# Patient Record
Sex: Female | Born: 1945 | Race: White | Hispanic: No | Marital: Married | Smoking: Never smoker
Health system: Southern US, Community
[De-identification: ages and names within clinical notes are randomized; demographics above are authoritative.]

## PROBLEM LIST (undated history)

## (undated) DIAGNOSIS — K219 Gastro-esophageal reflux disease without esophagitis: Secondary | ICD-10-CM

## (undated) DIAGNOSIS — R112 Nausea with vomiting, unspecified: Secondary | ICD-10-CM

## (undated) DIAGNOSIS — Z9889 Other specified postprocedural states: Secondary | ICD-10-CM

## (undated) DIAGNOSIS — K279 Peptic ulcer, site unspecified, unspecified as acute or chronic, without hemorrhage or perforation: Secondary | ICD-10-CM

## (undated) DIAGNOSIS — L409 Psoriasis, unspecified: Secondary | ICD-10-CM

## (undated) DIAGNOSIS — R0600 Dyspnea, unspecified: Secondary | ICD-10-CM

## (undated) DIAGNOSIS — I509 Heart failure, unspecified: Secondary | ICD-10-CM

## (undated) DIAGNOSIS — Z8489 Family history of other specified conditions: Secondary | ICD-10-CM

## (undated) DIAGNOSIS — J984 Other disorders of lung: Secondary | ICD-10-CM

## (undated) DIAGNOSIS — M1990 Unspecified osteoarthritis, unspecified site: Secondary | ICD-10-CM

## (undated) DIAGNOSIS — I10 Essential (primary) hypertension: Secondary | ICD-10-CM

## (undated) DIAGNOSIS — K911 Postgastric surgery syndromes: Secondary | ICD-10-CM

## (undated) DIAGNOSIS — I499 Cardiac arrhythmia, unspecified: Secondary | ICD-10-CM

## (undated) DIAGNOSIS — R519 Headache, unspecified: Secondary | ICD-10-CM

## (undated) DIAGNOSIS — I4891 Unspecified atrial fibrillation: Secondary | ICD-10-CM

## (undated) DIAGNOSIS — I219 Acute myocardial infarction, unspecified: Secondary | ICD-10-CM

## (undated) DIAGNOSIS — R51 Headache: Secondary | ICD-10-CM

## (undated) DIAGNOSIS — I2510 Atherosclerotic heart disease of native coronary artery without angina pectoris: Secondary | ICD-10-CM

## (undated) DIAGNOSIS — J479 Bronchiectasis, uncomplicated: Secondary | ICD-10-CM

## (undated) HISTORY — PX: LUNG SURGERY: SHX703

## (undated) HISTORY — DX: Other disorders of lung: J98.4

## (undated) HISTORY — PX: EYE SURGERY: SHX253

## (undated) HISTORY — PX: BACK SURGERY: SHX140

## (undated) HISTORY — DX: Essential (primary) hypertension: I10

## (undated) HISTORY — PX: FOOT SURGERY: SHX648

## (undated) HISTORY — PX: CHOLECYSTECTOMY: SHX55

## (undated) HISTORY — DX: Heart failure, unspecified: I50.9

## (undated) HISTORY — PX: THOROCOTOMY WITH LOBECTOMY: SHX6118

---

## 1997-08-29 ENCOUNTER — Other Ambulatory Visit: Admission: RE | Admit: 1997-08-29 | Discharge: 1997-08-29 | Payer: Self-pay | Admitting: Obstetrics and Gynecology

## 1998-09-23 ENCOUNTER — Other Ambulatory Visit: Admission: RE | Admit: 1998-09-23 | Discharge: 1998-09-23 | Payer: Self-pay | Admitting: Obstetrics and Gynecology

## 1999-09-27 ENCOUNTER — Other Ambulatory Visit: Admission: RE | Admit: 1999-09-27 | Discharge: 1999-09-27 | Payer: Self-pay | Admitting: Obstetrics and Gynecology

## 2000-09-27 ENCOUNTER — Other Ambulatory Visit: Admission: RE | Admit: 2000-09-27 | Discharge: 2000-09-27 | Payer: Self-pay | Admitting: Obstetrics and Gynecology

## 2003-03-12 ENCOUNTER — Other Ambulatory Visit: Admission: RE | Admit: 2003-03-12 | Discharge: 2003-03-12 | Payer: Self-pay | Admitting: Obstetrics and Gynecology

## 2004-08-17 ENCOUNTER — Ambulatory Visit: Payer: Self-pay | Admitting: Internal Medicine

## 2005-01-31 DIAGNOSIS — I219 Acute myocardial infarction, unspecified: Secondary | ICD-10-CM

## 2005-01-31 HISTORY — DX: Acute myocardial infarction, unspecified: I21.9

## 2005-04-11 ENCOUNTER — Inpatient Hospital Stay: Payer: Self-pay | Admitting: Internal Medicine

## 2005-06-06 ENCOUNTER — Ambulatory Visit: Payer: Self-pay | Admitting: Family Medicine

## 2005-10-13 ENCOUNTER — Ambulatory Visit: Payer: Self-pay | Admitting: Family Medicine

## 2009-09-21 ENCOUNTER — Ambulatory Visit: Payer: Self-pay | Admitting: Family Medicine

## 2010-05-20 ENCOUNTER — Ambulatory Visit: Payer: Self-pay | Admitting: Podiatry

## 2011-02-23 ENCOUNTER — Ambulatory Visit: Payer: Self-pay | Admitting: Family Medicine

## 2012-02-24 ENCOUNTER — Ambulatory Visit: Payer: Self-pay | Admitting: Family Medicine

## 2012-02-29 DIAGNOSIS — M5136 Other intervertebral disc degeneration, lumbar region: Secondary | ICD-10-CM | POA: Insufficient documentation

## 2012-03-12 ENCOUNTER — Encounter: Payer: Self-pay | Admitting: Rehabilitation

## 2012-03-31 ENCOUNTER — Encounter: Payer: Self-pay | Admitting: Rehabilitation

## 2012-04-23 ENCOUNTER — Ambulatory Visit: Payer: Self-pay | Admitting: Internal Medicine

## 2012-07-28 DIAGNOSIS — I2510 Atherosclerotic heart disease of native coronary artery without angina pectoris: Secondary | ICD-10-CM | POA: Diagnosis present

## 2012-07-28 DIAGNOSIS — E782 Mixed hyperlipidemia: Secondary | ICD-10-CM | POA: Insufficient documentation

## 2012-09-17 ENCOUNTER — Ambulatory Visit: Payer: Self-pay | Admitting: Family Medicine

## 2013-09-12 DIAGNOSIS — I428 Other cardiomyopathies: Secondary | ICD-10-CM | POA: Insufficient documentation

## 2013-10-03 ENCOUNTER — Encounter: Payer: Self-pay | Admitting: Sports Medicine

## 2013-10-03 ENCOUNTER — Ambulatory Visit (INDEPENDENT_AMBULATORY_CARE_PROVIDER_SITE_OTHER): Payer: Medicare Other | Admitting: Sports Medicine

## 2013-10-03 VITALS — BP 155/96 | Ht 66.0 in | Wt 135.0 lb

## 2013-10-03 DIAGNOSIS — I10 Essential (primary) hypertension: Secondary | ICD-10-CM

## 2013-10-03 DIAGNOSIS — M170 Bilateral primary osteoarthritis of knee: Secondary | ICD-10-CM

## 2013-10-03 DIAGNOSIS — M1710 Unilateral primary osteoarthritis, unspecified knee: Secondary | ICD-10-CM

## 2013-10-03 HISTORY — DX: Essential (primary) hypertension: I10

## 2013-10-03 MED ORDER — METHYLPREDNISOLONE ACETATE 40 MG/ML IJ SUSP
40.0000 mg | Freq: Once | INTRAMUSCULAR | Status: AC
Start: 2013-10-03 — End: 2013-10-03
  Administered 2013-10-03: 40 mg via INTRA_ARTICULAR

## 2013-10-03 NOTE — Progress Notes (Signed)
  Ana Washington - 68 y.o. female MRN [id number]  Date of birth: [birth year]  SUBJECTIVE:  Including CC & ROS.  Patient is a 68 year old Caucasian female presents today concerning of a cyst at the posterior aspect of her left knee. Patient reports that she was seen at urgent care by Dr. Leda Min who suspected she has a Baker cyst refer her over to her office. Patient reports that she's had tightness in her posterior knee for the past week. Describes the pain as a general soreness worse with getting out of the car or getting up from a sitting position. She also complains of some general aching within the colon her knee but denies any concern of fluid or effusion. She denies any known history of osteoarthritis of the knees no previous x-rays. She also denies ever seeing any cortisone injections. Patient has not taken any medication over-the-counter.   ROS: Review of systems otherwise negative except for information present in HPI  HISTORY: Past Medical, Surgical, Social, and Family History Reviewed & Updated per EMR. Pertinent Historical Findings include: Nonsmoker  DATA REVIEWED: No previous Xrays  PHYSICAL EXAM:  VS: BP:155/96 mmHg  HR: bpm  TEMP: ( )  RESP:   HT:5\' 6"  (167.6 cm)   WT:135 lb (61.236 kg)  BMI:21.8 PHYSICAL EXAM: KNEE EXAM:  General: well nourished Skin of LE: warm; dry, no rashes, lesions, ecchymosis or erythema. Vascular: Dorsal pedal pulses 2+ bilaterally Neurologically: Sensation to light touch lower extremities equal and intact bilaterally.  Observation: no knee effusion visualized, no previous surgical scars, patella in place, no quad muscle atrophy Palpation: No evidence of knee effusion with negative ballotable patella sign, no swelling to supra-patellar bursa  Palpable to 3 cm cystic mass at the posterior medial aspect of the left knee with mild tenderness No pain along the lateral joint line or medial joint line Range of motion:  Full knee flexion to  approximately 140, full extension 0, normal patellar tracking Ligamentous testing: ligamentous stablity  Negative patella apprehension test Meniscal evaluation: Negative McMurray's test, Negative thessaly's test, Normal gait  [facility] [place]: Ultrasound of the left knee revealed normal patella tendon, normal quadriceps tendon and long and short axis view, no significant joint effusion, mild calcifications in both medial and lateral meniscus. A 3-4 cm loculated Baker cyst posterior the last of the left knee.  ASSESSMENT & PLAN: See problem based charting & AVS for pt instructions.

## 2013-10-03 NOTE — Assessment & Plan Note (Signed)
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 Given the patient is having discomfort in his left knee with general aching recommended trying a intra-articular steroid injection to calm down inflammation. Do not recommend draining her Baker cyst given the loculated appearance on ultrasound. Suspect that the intra-articular injection will likely help with this generalized inflammation and potentially reabsorb this Baker cyst. Advised patient to followup in 3 weeks to reassess her knee pain. If she continues to have pain despite this injection before returning to the office to please call and we'll get her scheduled for bilateral x-rays prior to her follow-up.  Also provided patient with a body helix knee sleeve to help with compression.

## 2013-10-03 NOTE — Patient Instructions (Signed)
Wear knee brace with any activities or long periods of walking  Follow up in 3 week call soon if no improve and we will schedule knee xray before f/u in the office

## 2013-10-24 ENCOUNTER — Ambulatory Visit: Payer: Medicare Other | Admitting: Sports Medicine

## 2013-12-05 ENCOUNTER — Ambulatory Visit
Admission: RE | Admit: 2013-12-05 | Discharge: 2013-12-05 | Disposition: A | Discharge status: Home / Self Care | Payer: Medicare Other | Source: Ambulatory Visit | Attending: Sports Medicine | Admitting: Sports Medicine

## 2013-12-05 ENCOUNTER — Ambulatory Visit (INDEPENDENT_AMBULATORY_CARE_PROVIDER_SITE_OTHER): Payer: Medicare Other | Admitting: Sports Medicine

## 2013-12-05 ENCOUNTER — Encounter: Payer: Self-pay | Admitting: Sports Medicine

## 2013-12-05 VITALS — BP 133/84 | HR 67 | Ht 66.0 in | Wt 135.0 lb

## 2013-12-05 DIAGNOSIS — M170 Bilateral primary osteoarthritis of knee: Secondary | ICD-10-CM

## 2013-12-05 MED ORDER — DICLOFENAC SODIUM 1 % TD GEL
2.0000 g | Freq: Four times a day (QID) | TRANSDERMAL | Status: DC
Start: 2013-12-05 — End: 2017-02-07

## 2013-12-05 NOTE — Progress Notes (Addendum)
  Ana Washington - 68 y.o. female MRN [id number]  Date of birth: [birth year]  SUBJECTIVE:  Including CC & ROS.  Patient is a 68 year old Caucasian female falling up today on her left knee osteoarthritis.  Patient was last seen in September after she was discovered to have a Baker cyst wanted evaluation for possible drainage. Ultrasound exam revealed a loculated cyst that would not be amenable to drainage. Instead patient was treated with a intra-articular left knee cortisone injection, compression sleeve, and relative rest. Patient reported significant improvement in the swelling in her posterior knee and resolution of her pain with this injection. Also today reporting that for the most part her knee does feel better but she does have intermittent pain at times most of the pain is an occasional dull ache that is resolved with Tylenol or sharp pain that only lasts a few seconds. At this point patient has had no x-rays to evaluate the severity of her osteoarthritis.   ROS: Review of systems otherwise negative except for information present in HPI  HISTORY: Past Medical, Surgical, Social, and Family History Reviewed & Updated per EMR. Pertinent Historical Findings include: Nonsmoker HTN  DATA REVIEWED: No previous Xray's [facility] [place]: (10/03/13) Ultrasound of the left knee revealed normal patella tendon, normal quadriceps tendon and long and short axis view, no significant joint effusion, mild calcifications in both medial and lateral meniscus. A 3-4 cm loculated Baker cyst posterior the last of the left knee.  PHYSICAL EXAM:  VS: BP:133/84 mmHg  HR:67bpm  TEMP: ( )  RESP:   HT:5\' 6"  (167.6 cm)   WT:135 lb (61.236 kg)  BMI:21.8 PHYSICAL EXAM: KNEE EXAM:  General: well nourished Skin of LE: warm; dry, no rashes, lesions, ecchymosis or erythema. Vascular: Dorsal pedal pulses 2+ bilaterally Neurologically: Sensation to light touch lower extremities equal and intact bilaterally.  Observation: no  knee effusion visualized, no previous surgical scars, patella in place, no quad muscle atrophy Palpation: No evidence of knee effusion with negative ballotable patella sign, no swelling to supra-patellar bursa  Palpable to 1 cm cystic mass at the posterior medial aspect of the left knee with no tenderness Moderate to severe patella grinding No pain along the lateral joint line or medial joint line Range of motion:  Full knee flexion to approximately 140, full extension 0, normal patellar tracking Ligamentous testing: ligamentous stablity  Negative patella apprehension test Meniscal evaluation: Negative McMurray's test, positive thessaly's test, Normal gait   ASSESSMENT & PLAN: See problem based charting & AVS for pt instructions.   This patient has clinical contraindications to oral anti-inflammatory medication such as Motrin, ibuprofen, and meloxicam. She has a history of GI upset with the potential risk of GI bleed. Given these contraindications to oral anti-inflammatory medications I recommended topical anti-inflammatories as a more reasonable alternative to treat her osteoarthritis. Also given the evidence of mild to moderate osteoarthritis on her most recent x-rays patient would also find clinical benefit from hyaluronic acid injections for more localized intervention controlling her osteoarthritic knee pain. Patient did find some clinical benefit from steroid injection but had failed long-term relief from this intervention.

## 2013-12-05 NOTE — Patient Instructions (Addendum)
For intermittent symptomatic control of your osteoarthritis in your knee try the following modalities: -Wear the knee sleeve to help with compression, stabilization, and preventing swelling -Take Aleve 2 tablets in the morning and 2 tablets in the evening for 3-4 days to resolve pain and inflammation -Use the Voltaren gel topical anti-inflammatory for symptomatic control up to 3-4 times a day as needed - Apply heat or ice intermittently  If you're x-rays reveal severe arthritis we can repeat your steroid injection every 3-6 months with the next injection window at the beginning of December  If your knee x-rays reveal mild to moderate arthritis we can consider the Visco supplementation injections that will help lubricate the knee within the next 2-3 weeks.  Once I have the x-ray results will call and discuss further management options

## 2013-12-05 NOTE — Assessment & Plan Note (Signed)
Osteoarthritis of bilateral knees, with left knee pain. Patient on patient's clinical history, evidence of Baker cyst, and stable knee exam I suspect the majority for intermittent pain is from osteoarthritis.  Recommendations: -Advised patient that standing x-rays would be beneficial to further classify the degree of arthritis in her knee. -Patient only has mild to moderate arthritis we can consider a series of viscous supplementation injections to help with inflammation and pain. -Educated patient that if she has severe arthritis physical supplementation will likely not be clinically helpful. Further options for treatment with severe arthritis will be intermittent steroid injections every 3-6 months with no more than 3 a year versus referral to orthopedic surgeon for consideration of knee replacement.  - Patient is not interested in surgical intervention at this time would like to continue to treat with conservative management - Educated patient to treat intermittent pain and swelling with compression knee sleeve, topical Voltaren gel, and intermittent short courses of Aleve. We'll follow-up with patient over the phone with x-ray results and further planning for clinical management

## 2013-12-09 ENCOUNTER — Other Ambulatory Visit: Payer: Self-pay | Admitting: *Deleted

## 2013-12-16 ENCOUNTER — Ambulatory Visit: Payer: Self-pay | Admitting: Family Medicine

## 2015-06-30 ENCOUNTER — Encounter: Payer: Medicare Other | Attending: Family Medicine | Admitting: Respiratory Therapy

## 2015-06-30 DIAGNOSIS — J479 Bronchiectasis, uncomplicated: Secondary | ICD-10-CM | POA: Diagnosis present

## 2015-06-30 NOTE — Progress Notes (Signed)
Pulmonary Individual Treatment Plan  Patient Details  Name: Ana Washington MRN: [id number] Date of Birth: Jun 30, 1945 Referring Provider:  Dr Ana Washington  Initial Encounter Date:       Pulmonary Rehab from 06/30/2015 in [facility] Cardiac and Pulmonary Rehab   Date  06/30/15      Visit Diagnosis: Bronchiectasis without complication ([place])  Patient's Home Medications on Admission:  Current outpatient prescriptions:    alendronate (FOSAMAX) 70 MG tablet, take 1 tablet by mouth every week, Disp: , Rfl:    amLODipine (NORVASC) 5 MG tablet, take 1 tablet by mouth once daily, Disp: , Rfl:    azithromycin (ZITHROMAX) 250 MG tablet, Take by mouth., Disp: , Rfl:    Calcium Carbonate-Vitamin D (OYSTER SHELL/VITAMIN D) 600-125 MG-UNIT TABS, Take by mouth., Disp: , Rfl:    Cholecalciferol (VITAMIN D-1000 MAX ST) 1000 UNITS tablet, Take by mouth., Disp: , Rfl:    ciprofloxacin (CIPRO) 500 MG tablet, Take by mouth., Disp: , Rfl:    diclofenac sodium (VOLTAREN) 1 % GEL, Apply 2 g topically 4 (four) times daily., Disp: 100 g, Rfl: 1   fluocinonide (LIDEX) 0.05 % external solution, prn as needed, Disp: , Rfl:    fluticasone (FLONASE) 50 MCG/ACT nasal spray, Place into the nose., Disp: , Rfl:    hydrochlorothiazide (MICROZIDE) 12.5 MG capsule, take 1 capsule by mouth once daily, Disp: , Rfl:    ketoconazole (NIZORAL) 2 % shampoo, Apply topically., Disp: , Rfl:    losartan (COZAAR) 100 MG tablet, take 1 tablet by mouth once daily, Disp: , Rfl:    metoprolol succinate (TOPROL-XL) 50 MG 24 hr tablet, Take by mouth., Disp: , Rfl:    omeprazole (PRILOSEC OTC) 20 MG tablet, Take by mouth., Disp: , Rfl:    rivaroxaban (XARELTO) 20 MG TABS tablet, Take by mouth., Disp: , Rfl:    triamcinolone cream (KENALOG) 0.1 %, prn as needed, Disp: , Rfl:   Past Medical History: Past Medical History  Diagnosis Date   Hypertension    Lung disease    Essential hypertension, malignant 10/03/2013     Tobacco Use: History  Smoking status   Never Smoker   Smokeless tobacco   Not on file    Labs: Recent Review Flowsheet Data    There is no flowsheet data to display.       ADL [facility]:     Pulmonary Assessment Scores      06/30/15 1032       ADL [facility]   SOB Score total 77     Rest 1     Walk 3     Stairs 4     Bath 4     Dress 3     Shop 4        Pulmonary Function Assessment:     Pulmonary Function Assessment - 06/30/15 0915    Initial Spirometry Results   FVC% 37 %   FEV1% 42 %   FEV1/FVC Ratio 86.96   Post Bronchodilator Spirometry Results   FVC% 49 %   FEV1% 46 %   FEV1/FVC Ratio 80.79   Breath   Bilateral Breath Sounds Clear;Decreased   Shortness of Breath Yes;Fear of Shortness of Breath;Limiting activity      Exercise Target Goals: Date: 06/30/15  Exercise Program Goal: Individual exercise prescription set with THRR, safety & activity barriers. Participant demonstrates ability to understand and report RPE using BORG scale, to self-measure pulse accurately, and to acknowledge the importance of the exercise prescription.  Exercise Prescription Goal: Starting with aerobic activity 30 plus minutes a day, 3 days per week for initial exercise prescription. Provide home exercise prescription and guidelines that participant acknowledges understanding prior to discharge.  Activity Barriers & Risk Stratification:     Activity Barriers & Cardiac Risk Stratification - 06/30/15 0915    Activity Barriers & Cardiac Risk Stratification   Activity Barriers Shortness of Breath;Deconditioning;Muscular Weakness;Other (comment)   Comments Chronic cough   Cardiac Risk Stratification High      6 Minute Walk:     6 Minute Walk      06/30/15 1128       6 Minute Walk   Distance 912 feet     Walk Time 4.73 minutes     # of Rest Breaks 1  rest 1:16      MPH 2.19     METS 1.5     RPE 15     Perceived Dyspnea  7     Symptoms Yes (comment)      Comments shortness of breath     Resting HR 80 bpm     Resting BP 120/80 mmHg     Max Ex. HR 114 bpm     Max Ex. BP 158/90 mmHg     2 Minute Post BP 126/78 mmHg     Interval HR   Baseline HR 80     1 Minute HR 98     2 Minute HR 104     3 Minute HR 111     5 Minute HR 114     6 Minute HR 114     2 Minute Post HR 88     Interval Heart Rate? Yes     Interval Oxygen   Interval Oxygen? Yes     Baseline Oxygen Saturation % 96 %     1 Minute Oxygen Saturation % 97 %     2 Minute Oxygen Saturation % 93 %     3 Minute Oxygen Saturation % 93 %     5 Minute Oxygen Saturation % 91 %     6 Minute Oxygen Saturation % 93 %     2 Minute Post Oxygen Saturation % 98 %        Initial Exercise Prescription:     Initial Exercise Prescription - 06/30/15 1100    Date of Initial Exercise RX and Referring Provider   Date 06/30/15   Treadmill   MPH 1.3   Grade 0   Minutes 5  3:00 walk then rest 1:30   METs 1.5   NuStep   Level 1   Watts 20   Minutes 15  3 min then rest 1:30   METs 1.5   Biostep-RELP   Level 1   Watts 20   Minutes 15  3 min then rest 1:30   METs 1.5   Prescription Details   Frequency (times per week) 3   Duration Progress to 45 minutes of aerobic exercise without signs/symptoms of physical distress   Intensity   THRR 40-80% of Max Heartrate 108-136   Ratings of Perceived Exertion 11-13   Perceived Dyspnea 2-4   Progression   Progression Continue to progress workloads to maintain intensity without signs/symptoms of physical distress.   Resistance Training   Training Prescription Yes   Weight 1   Reps 10-12      Perform Capillary Blood Glucose checks as needed.  Exercise Prescription Changes:   Exercise Comments:   Discharge Exercise Prescription (Final  Exercise Prescription Changes):    Nutrition:  Target Goals: Understanding of nutrition guidelines, daily intake of sodium 1500mg , cholesterol 200mg , calories 30% from fat and 7% or less from  saturated fats, daily to have 5 or more servings of fruits and vegetables.  Biometrics:    Nutrition Therapy Plan and Nutrition Goals:     Nutrition Therapy & Goals - 06/30/15 0915    Nutrition Therapy   Diet Ana Washington prefers not to meet with the dietitian. She has lost 20lbs due to her bronchiectasis. Supplements, such as boost, Ensure make her have diarrhea. She cooks healthy and does eat out occasionally.      Nutrition Discharge: Rate Your Plate Scores:   Psychosocial: Target Goals: Acknowledge presence or absence of depression, maximize coping skills, provide positive support system. Participant is able to verbalize types and ability to use techniques and skills needed for reducing stress and depression.  Initial Review & Psychosocial Screening:     Initial Psych Review & Screening - 06/30/15 0915    Family Dynamics   Good Support System? Yes   Comments Ana Okon has good support from her son and daughter. Her sisters and brothers live close, so Ana Gleba has help with house chores. She does take care of her husband which is very difficult, especially with her shortness of breath.  She is hopeful that pul rehab will help improve her quality of life.                                Barriers   Psychosocial barriers to participate in program The patient should benefit from training in stress management and relaxation.   Screening Interventions   Interventions Program counselor consult;Encouraged to exercise      Quality of Life Scores:     Quality of Life - 06/30/15 0915    Quality of Life Scores   Health/Function Pre 15.75 %   Socioeconomic Pre 30 %   Psych/Spiritual Pre 20.57 %   Family Pre 26.4 %   GLOBAL Pre 21.09 %      PHQ-9:     Recent Review Flowsheet Data    Depression screen [facility] 2/9 06/30/2015   Decreased Interest 0   Down, Depressed, Hopeless 0   PHQ - 2 Score 0   Altered sleeping 2   Tired, decreased energy 3   Change in appetite 3    Feeling bad or failure about yourself  0   Trouble concentrating 1   Moving slowly or fidgety/restless 2   Suicidal thoughts 0   PHQ-9 Score 11   Difficult doing work/chores Very difficult      Psychosocial Evaluation and Intervention:   Psychosocial Re-Evaluation:  Education: Education Goals: Education classes will be provided on a weekly basis, covering required topics. Participant will state understanding/return demonstration of topics presented.  Learning Barriers/Preferences:     Learning Barriers/Preferences - 06/30/15 0915    Learning Barriers/Preferences   Learning Barriers None   Learning Preferences Group Instruction;Individual Instruction;Pictoral;Skilled Demonstration;Verbal Instruction;Video;Written Material      Education Topics: Initial Evaluation Education: - Verbal, written and demonstration of respiratory meds, RPE/PD scales, oximetry and breathing techniques. Instruction on use of nebulizers and MDIs: cleaning and proper use, rinsing mouth with steroid doses and importance of monitoring MDI activations.          Pulmonary Rehab from 06/30/2015 in [facility] Cardiac and Pulmonary Rehab   Date  06/30/15  Educator  LB   Instruction Review Code  2- meets goals/outcomes      General Nutrition Guidelines/Fats and Fiber: -Group instruction provided by verbal, written material, models and posters to present the general guidelines for heart healthy nutrition. Gives an explanation and review of dietary fats and fiber.   Controlling Sodium/Reading Food Labels: -Group verbal and written material supporting the discussion of sodium use in heart healthy nutrition. Review and explanation with models, verbal and written materials for utilization of the food label.   Exercise Physiology & Risk Factors: - Group verbal and written instruction with models to review the exercise physiology of the cardiovascular system and associated critical values. Details cardiovascular  disease risk factors and the goals associated with each risk factor.   Aerobic Exercise & Resistance Training: - Gives group verbal and written discussion on the health impact of inactivity. On the components of aerobic and resistive training programs and the benefits of this training and how to safely progress through these programs.   Flexibility, Balance, General Exercise Guidelines: - Provides group verbal and written instruction on the benefits of flexibility and balance training programs. Provides general exercise guidelines with specific guidelines to those with heart or lung disease. Demonstration and skill practice provided.   Stress Management: - Provides group verbal and written instruction about the health risks of elevated stress, cause of high stress, and healthy ways to reduce stress.   Depression: - Provides group verbal and written instruction on the correlation between heart/lung disease and depressed mood, treatment options, and the stigmas associated with seeking treatment.   Exercise & Equipment Safety: - Individual verbal instruction and demonstration of equipment use and safety with use of the equipment.   Infection Prevention: - Provides verbal and written material to individual with discussion of infection control including proper hand washing and proper equipment cleaning during exercise session.      Pulmonary Rehab from 06/30/2015 in [facility] Cardiac and Pulmonary Rehab   Date  06/30/15   Educator  LB   Instruction Review Code  2- meets goals/outcomes      Falls Prevention: - Provides verbal and written material to individual with discussion of falls prevention and safety.      Pulmonary Rehab from 06/30/2015 in [facility] Cardiac and Pulmonary Rehab   Date  06/30/15   Educator  LB   Instruction Review Code  2- meets goals/outcomes      Diabetes: - Individual verbal and written instruction to review signs/symptoms of diabetes, desired ranges of glucose level  fasting, after meals and with exercise. Advice that pre and post exercise glucose checks will be done for 3 sessions at entry of program.   Chronic Lung Diseases: - Group verbal and written instruction to review new updates, new respiratory medications, new advancements in procedures and treatments. Provide informative websites and "800" numbers of self-education.   Lung Procedures: - Group verbal and written instruction to describe testing methods done to diagnose lung disease. Review the outcome of test results. Describe the treatment choices: Pulmonary Function Tests, ABGs and oximetry.   Energy Conservation: - Provide group verbal and written instruction for methods to conserve energy, plan and organize activities. Instruct on pacing techniques, use of adaptive equipment and posture/positioning to relieve shortness of breath.   Triggers: - Group verbal and written instruction to review types of environmental controls: home humidity, furnaces, filters, dust mite/pet prevention, HEPA vacuums. To discuss weather changes, air quality and the benefits of nasal washing.   Exacerbations: - Group  verbal and written instruction to provide: warning signs, infection symptoms, calling MD promptly, preventive modes, and value of vaccinations. Review: effective airway clearance, coughing and/or vibration techniques. Create an Sports administrator.   Oxygen: - Individual and group verbal and written instruction on oxygen therapy. Includes supplement oxygen, available portable oxygen systems, continuous and intermittent flow rates, oxygen safety, concentrators, and Medicare reimbursement for oxygen.   Respiratory Medications: - Group verbal and written instruction to review medications for lung disease. Drug class, frequency, complications, importance of spacers, rinsing mouth after steroid MDI's, and proper cleaning methods for nebulizers.      Pulmonary Rehab from 06/30/2015 in [facility] Cardiac and Pulmonary  Rehab   Date  06/30/15   Educator  LB   Instruction Review Code  2- meets goals/outcomes      AED/CPR: - Group verbal and written instruction with the use of models to demonstrate the basic use of the AED with the basic ABC's of resuscitation.   Breathing Retraining: - Provides individuals verbal and written instruction on purpose, frequency, and proper technique of diaphragmatic breathing and pursed-lipped breathing. Applies individual practice skills.      Pulmonary Rehab from 06/30/2015 in [facility] Cardiac and Pulmonary Rehab   Date  06/30/15   Educator  LB   Instruction Review Code  2- meets goals/outcomes      Anatomy and Physiology of the Lungs: - Group verbal and written instruction with the use of models to provide basic lung anatomy and physiology related to function, structure and complications of lung disease.   Heart Failure: - Group verbal and written instruction on the basics of heart failure: signs/symptoms, treatments, explanation of ejection fraction, enlarged heart and cardiomyopathy.   Sleep Apnea: - Individual verbal and written instruction to review Obstructive Sleep Apnea. Review of risk factors, methods for diagnosing and types of masks and machines for OSA.   Anxiety: - Provides group, verbal and written instruction on the correlation between heart/lung disease and anxiety, treatment options, and management of anxiety.   Relaxation: - Provides group, verbal and written instruction about the benefits of relaxation for patients with heart/lung disease. Also provides patients with examples of relaxation techniques.   Knowledge Questionnaire Score:     Knowledge Questionnaire Score - 06/30/15 0915    Knowledge Questionnaire Score   Pre Score 9/10       Core Components/Risk Factors/Patient Goals at Admission:     Personal Goals and Risk Factors at Admission - 06/30/15 0915    Core Components/Risk Factors/Patient Goals on Admission    Weight  Management Yes;Weight Gain   Intervention Weight Management: Develop a combined nutrition and exercise program designed to reach desired caloric intake, while maintaining appropriate intake of nutrient and fiber, sodium and fats, and appropriate energy expenditure required for the weight goal.;Weight Management: Provide education and appropriate resources to help participant work on and attain dietary goals.   Admit Weight 108 lb (48.988 kg)   Goal Weight: Short Term 113 lb (51.256 kg)   Goal Weight: Long Term 120 lb (54.432 kg)   Expected Outcomes Short Term: Continue to assess and modify interventions until short term weight is achieved   Sedentary Yes  Ana Vroman has a home treadmill.   Intervention Provide advice, education, support and counseling about physical activity/exercise needs.;Develop an individualized exercise prescription for aerobic and resistive training based on initial evaluation findings, risk stratification, comorbidities and participant's personal goals.   Expected Outcomes Achievement of increased cardiorespiratory fitness and enhanced flexibility, muscular endurance and  strength shown through measurements of functional capacity and personal statement of participant.   Increase Strength and Stamina Yes   Intervention Provide advice, education, support and counseling about physical activity/exercise needs.;Develop an individualized exercise prescription for aerobic and resistive training based on initial evaluation findings, risk stratification, comorbidities and participant's personal goals.   Expected Outcomes Achievement of increased cardiorespiratory fitness and enhanced flexibility, muscular endurance and strength shown through measurements of functional capacity and personal statement of participant.   Improve shortness of breath with ADL's Yes   Intervention Provide education, individualized exercise plan and daily activity instruction to help decrease symptoms of SOB  with activities of daily living.   Expected Outcomes Short Term: Achieves a reduction of symptoms when performing activities of daily living.   Develop more efficient breathing techniques such as purse lipped breathing and diaphragmatic breathing; and practicing self-pacing with activity Yes   Intervention Provide education, demonstration and support about specific breathing techniuqes utilized for more efficient breathing. Include techniques such as pursed lipped breathing, diaphragmatic breathing and self-pacing activity.   Expected Outcomes Short Term: Participant will be able to demonstrate and use breathing techniques as needed throughout daily activities.   Increase knowledge of respiratory medications and ability to use respiratory devices properly  Yes  Ana Neuville uses an Albuterol inhaler occasionally. She takes inhaled Amikacin, Asithromycin, and Cipro daily. She has Tussionex, but will only use it at night for sleep.   Intervention Provide education and demonstration as needed of appropriate use of medications, inhalers, and oxygen therapy.   Expected Outcomes Short Term: Achieves understanding of medications use. Understands that oxygen is a medication prescribed by physician. Demonstrates appropriate use of inhaler and oxygen therapy.      Core Components/Risk Factors/Patient Goals Review:    Core Components/Risk Factors/Patient Goals at Discharge (Final Review):    ITP Comments:   Comments: Ana Clucas plans to start [facility] on 07/06/2015 and will attend 3 days/week.

## 2015-06-30 NOTE — Patient Instructions (Signed)
Patient Instructions  Patient Details  Name: JAHMIYA MCLAIN MRN: [id number] Date of Birth: 1946-01-23 Referring Provider:  Nonah Mattes  Below are the personal goals you chose as well as exercise and nutrition goals. Our goal is to help you keep on track towards obtaining and maintaining your goals. We will be discussing your progress on these goals with you throughout the program.  Initial Exercise Prescription:     Initial Exercise Prescription - 06/30/15 1100    Date of Initial Exercise RX and Referring Provider   Date 06/30/15   Treadmill   MPH 1.3   Grade 0   Minutes 5  3:00 walk then rest 1:30   METs 1.5   NuStep   Level 1   Watts 20   Minutes 15  3 min then rest 1:30   METs 1.5   Biostep-RELP   Level 1   Watts 20   Minutes 15  3 min then rest 1:30   METs 1.5   Prescription Details   Frequency (times per week) 3   Duration Progress to 45 minutes of aerobic exercise without signs/symptoms of physical distress   Intensity   THRR 40-80% of Max Heartrate 108-136   Ratings of Perceived Exertion 11-13   Perceived Dyspnea 2-4   Progression   Progression Continue to progress workloads to maintain intensity without signs/symptoms of physical distress.   Resistance Training   Training Prescription Yes   Weight 1   Reps 10-12      Exercise Goals: Frequency: Be able to perform aerobic exercise three times per week working toward 3-5 days per week.  Intensity: Work with a perceived exertion of 11 (fairly light) - 15 (hard) as tolerated. Follow your new exercise prescription and watch for changes in prescription as you progress with the program. Changes will be reviewed with you when they are made.  Duration: You should be able to do 30 minutes of continuous aerobic exercise in addition to a 5 minute warm-up and a 5 minute cool-down routine.  Nutrition Goals: Your personal nutrition goals will be established when you do your nutrition analysis with the  dietician.  The following are nutrition guidelines to follow: Cholesterol < 200mg /day Sodium < 1500mg /day Fiber: Women over 50 yrs - 21 grams per day  Personal Goals:     Personal Goals and Risk Factors at Admission - 06/30/15 0915    Core Components/Risk Factors/Patient Goals on Admission    Weight Management Yes;Weight Gain   Intervention Weight Management: Develop a combined nutrition and exercise program designed to reach desired caloric intake, while maintaining appropriate intake of nutrient and fiber, sodium and fats, and appropriate energy expenditure required for the weight goal.;Weight Management: Provide education and appropriate resources to help participant work on and attain dietary goals.   Admit Weight 108 lb (48.988 kg)   Goal Weight: Short Term 113 lb (51.256 kg)   Goal Weight: Long Term 120 lb (54.432 kg)   Expected Outcomes Short Term: Continue to assess and modify interventions until short term weight is achieved   Sedentary Yes  Ms Azzarello has a home treadmill.   Intervention Provide advice, education, support and counseling about physical activity/exercise needs.;Develop an individualized exercise prescription for aerobic and resistive training based on initial evaluation findings, risk stratification, comorbidities and participant's personal goals.   Expected Outcomes Achievement of increased cardiorespiratory fitness and enhanced flexibility, muscular endurance and strength shown through measurements of functional capacity and personal statement of participant.   Increase Strength  and Stamina Yes   Intervention Provide advice, education, support and counseling about physical activity/exercise needs.;Develop an individualized exercise prescription for aerobic and resistive training based on initial evaluation findings, risk stratification, comorbidities and participant's personal goals.   Expected Outcomes Achievement of increased cardiorespiratory fitness and  enhanced flexibility, muscular endurance and strength shown through measurements of functional capacity and personal statement of participant.   Improve shortness of breath with ADL's Yes   Intervention Provide education, individualized exercise plan and daily activity instruction to help decrease symptoms of SOB with activities of daily living.   Expected Outcomes Short Term: Achieves a reduction of symptoms when performing activities of daily living.   Develop more efficient breathing techniques such as purse lipped breathing and diaphragmatic breathing; and practicing self-pacing with activity Yes   Intervention Provide education, demonstration and support about specific breathing techniuqes utilized for more efficient breathing. Include techniques such as pursed lipped breathing, diaphragmatic breathing and self-pacing activity.   Expected Outcomes Short Term: Participant will be able to demonstrate and use breathing techniques as needed throughout daily activities.   Increase knowledge of respiratory medications and ability to use respiratory devices properly  Yes  Ms Gennett uses an Albuterol inhaler occasionally. She takes inhaled Amikacin, Asithromycin, and Cipro daily. She has Tussionex, but will only use it at night for sleep.   Intervention Provide education and demonstration as needed of appropriate use of medications, inhalers, and oxygen therapy.   Expected Outcomes Short Term: Achieves understanding of medications use. Understands that oxygen is a medication prescribed by physician. Demonstrates appropriate use of inhaler and oxygen therapy.      Tobacco Use Initial Evaluation: History  Smoking status   Never Smoker   Smokeless tobacco   Not on file    Copy of goals given to participant.

## 2015-07-06 ENCOUNTER — Encounter: Payer: Medicare Other | Attending: Family Medicine | Admitting: *Deleted

## 2015-07-06 DIAGNOSIS — J479 Bronchiectasis, uncomplicated: Secondary | ICD-10-CM | POA: Diagnosis not present

## 2015-07-06 NOTE — Progress Notes (Signed)
Daily Session Note  Patient Details  Name: Ana Washington MRN: [id number] Date of Birth: [birth year] Referring Provider:    Encounter Date: 07/06/2015  Check In:     Session Check In - 07/06/15 1159    Check-In   Location [facility]-Cardiac & Pul[facility]   Staff Present Carson Myrtle, BS, RRT, Respiratory Therapist;Douglas Smolinsky Amedeo Plenty, BS, ACSM CEP, Exercise Physiologist;Jessica Luan Pulling, MA, ACSM RCEP, Exercise Physiologist   Supervising physician immediately available to respond to emergencies [facility] immediately available ER MD   Physician(s) Cinda Quest and Kinner   Medication changes reported     No   Fall or balance concerns reported    No   Warm-up and Cool-down Performed on first and last piece of equipment   Resistance Training Performed Yes   VAD Patient? No   Pain Assessment   Currently in Pain? No/denies   Multiple Pain Sites No         Goals Met:  Proper associated with RPD/PD & O2 Sat Exercise tolerated well Strength training completed today  Goals Unmet:  Not Applicable  Comments: Today was the patient's first day of class. The patient's initial exercise prescription (based on the 6 min walk evaluation) was reviewed with the patient.     Dr. Emily Filbert is Medical Director for [place] and [facility].

## 2015-07-08 ENCOUNTER — Encounter: Payer: Medicare Other | Admitting: Respiratory Therapy

## 2015-07-08 DIAGNOSIS — J479 Bronchiectasis, uncomplicated: Secondary | ICD-10-CM

## 2015-07-08 NOTE — Progress Notes (Signed)
Daily Session Note  Patient Details  Name: JUDAH CHEVERE MRN: [id number] Date of Birth: 1945-04-28 Referring Provider:    Encounter Date: 07/08/2015  Check In:     Session Check In - 07/08/15 1109    Check-In   Location [facility]   Staff Present Carson Myrtle, BS, RRT, Respiratory Therapist;Carroll Enterkin, RN, Vickki Hearing, BA, ACSM CEP, Exercise Physiologist   Supervising physician immediately available to respond to emergencies [facility] immediately available ER MD   Physician(s) Darl Householder and Mcl[place]in   Medication changes reported     No   Fall or balance concerns reported    No   Warm-up and Cool-down Performed on first and last piece of equipment   Resistance Training Performed Yes   VAD Patient? No   Pain Assessment   Currently in Pain? No/denies   Multiple Pain Sites No         Goals Met:  Proper associated with RPD/PD & O2 Sat Independence with exercise equipment Exercise tolerated well Strength training completed today  Goals Unmet:  Not Applicable  Comments: Pt able to follow exercise prescription today without complaint.  Will continue to monitor for progression.    Dr. Emily Filbert is Medical Director for [place] and [facility].

## 2015-07-10 ENCOUNTER — Encounter: Payer: Medicare Other | Admitting: *Deleted

## 2015-07-10 DIAGNOSIS — J479 Bronchiectasis, uncomplicated: Secondary | ICD-10-CM

## 2015-07-10 NOTE — Progress Notes (Signed)
Daily Session Note  Patient Details  Name: Ana Washington MRN: [id number] Date of Birth: 21-Apr-1945 Referring Provider:        Pulmonary Rehab from 07/08/2015 in [facility]   Referring Provider  [name]      Encounter Date: 07/10/2015  Check In:     Session Check In - 07/10/15 1119    Check-In   Staff Present Heath Lark, RN, BSN, CCRP;Carroll Enterkin, RN, Levie Heritage, MA, ACSM RCEP, Exercise Physiologist   Supervising physician immediately available to respond to emergencies [facility] immediately available ER MD   Physician(s) Drs: Burlene Arnt and Paduchowski   Medication changes reported     No   Fall or balance concerns reported    No   Warm-up and Cool-down Performed on first and last piece of equipment   Resistance Training Performed Yes   VAD Patient? No   Pain Assessment   Currently in Pain? No/denies         Goals Met:  Exercise tolerated well Personal goals reviewed Strength training completed today  Goals Unmet:  See ITP COMMENTS FOR BP CHANGES  Comments: 2nd session.  States is doing well with the routine.     Dr. Emily Filbert is Medical Director for [place] and [facility].

## 2015-07-13 ENCOUNTER — Encounter: Payer: Medicare Other | Admitting: *Deleted

## 2015-07-13 ENCOUNTER — Encounter: Payer: Self-pay | Admitting: *Deleted

## 2015-07-13 DIAGNOSIS — J479 Bronchiectasis, uncomplicated: Secondary | ICD-10-CM

## 2015-07-13 NOTE — Progress Notes (Signed)
Daily Session Note  Patient Details  Name: Ana Washington MRN: [id number] Date of Birth: 1945-06-13 Referring Provider:        Pulmonary Rehab from 07/08/2015 in [facility] Card[facility] and Pul[facility] Re[facility]   Referring Provider  [name]      Encounter Date: 07/13/2015  Check In:     Session Check In - 07/13/15 1241    Check-In   Location [facility]   Staff Present Carson Myrtle, BS, RRT, Respiratory Therapist;Ewelina Naves Amedeo Plenty, BS, ACSM CEP, Exercise Physiologist;Jessica Luan Pulling, MA, ACSM RCEP, Exercise Physiologist   Supervising physician immediately available to respond to emergencies [facility] immediately available ER MD   Physician(s) Drs. Marcelene Butte and Maynard   Medication changes reported     No   Fall or balance concerns reported    No   Warm-up and Cool-down Performed on first and last piece of equipment   Resistance Training Performed Yes   VAD Patient? No   Pain Assessment   Currently in Pain? No/denies   Multiple Pain Sites No         Goals Met:  Proper associated with RPD/PD & O2 Sat Independence with exercise equipment Exercise tolerated well Strength training completed today  Goals Unmet:  Not Applicable  Comments: Reviewed individualized exercise prescription and made increases per departmental policy. Exercise increases were discussed with the patient and they were able to perform the new work loads without issue (no signs or symptoms).     Dr. Emily Filbert is Medical Director for [place] and [facility].

## 2015-07-13 NOTE — Progress Notes (Signed)
Pulmonary Individual Treatment Plan  Patient Details  Name: Ana Washington MRN: [id number] Date of Birth: 1945-07-09 Referring Provider:        Pulmonary Rehab from 07/08/2015 in [facility] Cardiac and Pulmonary Rehab   Referring Provider  [name]      Initial Encounter Date:       Pulmonary Rehab from 07/08/2015 in [facility] Cardiac and Pulmonary Rehab   Referring Provider  [name]      Visit Diagnosis: Bronchiectasis without complication ([place])  Patient's Home Medications on Admission:  Current outpatient prescriptions:  .  alendronate (FOSAMAX) 70 MG tablet, take 1 tablet by mouth every week, Disp: , Rfl:  .  amLODipine (NORVASC) 5 MG tablet, take 1 tablet by mouth once daily, Disp: , Rfl:  .  azithromycin (ZITHROMAX) 250 MG tablet, Take by mouth., Disp: , Rfl:  .  Calcium Carbonate-Vitamin D (OYSTER SHELL/VITAMIN D) 600-125 MG-UNIT TABS, Take by mouth., Disp: , Rfl:  .  Cholecalciferol (VITAMIN D-1000 MAX ST) 1000 UNITS tablet, Take by mouth., Disp: , Rfl:  .  ciprofloxacin (CIPRO) 500 MG tablet, Take by mouth., Disp: , Rfl:  .  diclofenac sodium (VOLTAREN) 1 % GEL, Apply 2 g topically 4 (four) times daily., Disp: 100 g, Rfl: 1 .  fluocinonide (LIDEX) 0.05 % external solution, prn as needed, Disp: , Rfl:  .  fluticasone (FLONASE) 50 MCG/ACT nasal spray, Place into the nose., Disp: , Rfl:  .  hydrochlorothiazide (MICROZIDE) 12.5 MG capsule, take 1 capsule by mouth once daily, Disp: , Rfl:  .  ketoconazole (NIZORAL) 2 % shampoo, Apply topically., Disp: , Rfl:  .  losartan (COZAAR) 100 MG tablet, take 1 tablet by mouth once daily, Disp: , Rfl:  .  metoprolol succinate (TOPROL-XL) 50 MG 24 hr tablet, Take by mouth., Disp: , Rfl:  .  omeprazole (PRILOSEC OTC) 20 MG tablet, Take by mouth., Disp: , Rfl:  .  rivaroxaban (XARELTO) 20 MG TABS tablet, Take by mouth., Disp: , Rfl:  .  triamcinolone cream (KENALOG) 0.1 %, prn as needed, Disp: , Rfl:   Past Medical History: Past Medical  History  Diagnosis Date  . Hypertension   . Lung disease   . Essential hypertension, malignant 10/03/2013    Tobacco Use: History  Smoking status  . Never Smoker   Smokeless tobacco  . Not on file    Labs: Recent Review Flowsheet Data    There is no flowsheet data to display.       ADL UCSD:     Pulmonary Assessment Scores      06/30/15 1032       ADL UCSD   SOB Score total 77     Rest 1     Walk 3     Stairs 4     Bath 4     Dress 3     Shop 4        Pulmonary Function Assessment:     Pulmonary Function Assessment - 06/30/15 0915    Initial Spirometry Results   FVC% 37 %   FEV1% 42 %   FEV1/FVC Ratio 86.96   Post Bronchodilator Spirometry Results   FVC% 49 %   FEV1% 46 %   FEV1/FVC Ratio 80.79   Breath   Bilateral Breath Sounds Clear;Decreased   Shortness of Breath Yes;Fear of Shortness of Breath;Limiting activity      Exercise Target Goals:    Exercise Program Goal: Individual exercise prescription set with THRR, safety &  activity barriers. Participant demonstrates ability to understand and report RPE using BORG scale, to self-measure pulse accurately, and to acknowledge the importance of the exercise prescription.  Exercise Prescription Goal: Starting with aerobic activity 30 plus minutes a day, 3 days per week for initial exercise prescription. Provide home exercise prescription and guidelines that participant acknowledges understanding prior to discharge.  Activity Barriers & Risk Stratification:     Activity Barriers & Cardiac Risk Stratification - 06/30/15 0915    Activity Barriers & Cardiac Risk Stratification   Activity Barriers Shortness of Breath;Deconditioning;Muscular Weakness;Other (comment)   Comments Chronic cough   Cardiac Risk Stratification High      6 Minute Walk:     6 Minute Walk      06/30/15 1128       6 Minute Walk   Distance 912 feet     Walk Time 4.73 minutes     # of Rest Breaks 1  rest 1:16      MPH  2.19     METS 1.5     RPE 15     Perceived Dyspnea  7     Symptoms Yes (comment)     Comments shortness of breath     Resting HR 80 bpm     Resting BP 120/80 mmHg     Max Ex. HR 114 bpm     Max Ex. BP 158/90 mmHg     2 Minute Post BP 126/78 mmHg     Interval HR   Baseline HR 80     1 Minute HR 98     2 Minute HR 104     3 Minute HR 111     5 Minute HR 114     6 Minute HR 114     2 Minute Post HR 88     Interval Heart Rate? Yes     Interval Oxygen   Interval Oxygen? Yes     Baseline Oxygen Saturation % 96 %     1 Minute Oxygen Saturation % 97 %     2 Minute Oxygen Saturation % 93 %     3 Minute Oxygen Saturation % 93 %     5 Minute Oxygen Saturation % 91 %     6 Minute Oxygen Saturation % 93 %     2 Minute Post Oxygen Saturation % 98 %        Initial Exercise Prescription:     Initial Exercise Prescription - 07/08/15 1400    Date of Initial Exercise RX and Referring Provider   Referring Provider [name]      Perform Capillary Blood Glucose checks as needed.  Exercise Prescription Changes:     Exercise Prescription Changes      07/06/15 1500           Response to Exercise   Blood Pressure (Admit) 124/66 mmHg       Blood Pressure (Exercise) 118/60 mmHg       Blood Pressure (Exit) 102/80 mmHg       Heart Rate (Admit) 91 bpm       Heart Rate (Exercise) 87 bpm       Heart Rate (Exit) 79 bpm       Oxygen Saturation (Admit) 96 %       Oxygen Saturation (Exercise) 94 %       Oxygen Saturation (Exit) 95 %       Rating of Perceived Exertion (Exercise) 13  Perceived Dyspnea (Exercise) 3       Symptoms none       Duration Progress to 30 minutes of continuous aerobic without signs/symptoms of physical distress       Intensity THRR unchanged       Progression   Progression Continue to progress workloads to maintain intensity without signs/symptoms of physical distress.       Average METs 2.6       Resistance Training   Training Prescription Yes        Weight 2       Reps 10-12       Interval Training   Interval Training No       Treadmill   MPH 1.5       Grade 0       Minutes 10       NuStep   Level 2       Watts 20       Minutes 16       METs 1.8       REL-XR   Level 2       Watts 40       Minutes 10       METs 4          Exercise Comments:     Exercise Comments      07/06/15 1201 07/07/15 1355 07/08/15 1400 07/13/15 1242     Exercise Comments Today was the patient's first day of class. The patient's initial exercise prescription (based on the 6 min walk evaluation) was reviewed with the patient. The patient tolerated her exercise well with no symptoms or complications. All vitals were within acceptable ranges.  [name] is off to a good start for exercise.  We will continue to monitor for progression. Ms Maris has doen well her first sessions and has been able to complete continous 10 min bouts on each machine.   Increases were made on the TM for patient. Changes were discussed with patient and tolerated well.        Discharge Exercise Prescription (Final Exercise Prescription Changes):     Exercise Prescription Changes - 07/06/15 1500    Response to Exercise   Blood Pressure (Admit) 124/66 mmHg   Blood Pressure (Exercise) 118/60 mmHg   Blood Pressure (Exit) 102/80 mmHg   Heart Rate (Admit) 91 bpm   Heart Rate (Exercise) 87 bpm   Heart Rate (Exit) 79 bpm   Oxygen Saturation (Admit) 96 %   Oxygen Saturation (Exercise) 94 %   Oxygen Saturation (Exit) 95 %   Rating of Perceived Exertion (Exercise) 13   Perceived Dyspnea (Exercise) 3   Symptoms none   Duration Progress to 30 minutes of continuous aerobic without signs/symptoms of physical distress   Intensity THRR unchanged   Progression   Progression Continue to progress workloads to maintain intensity without signs/symptoms of physical distress.   Average METs 2.6   Resistance Training   Training Prescription Yes   Weight 2   Reps 10-12   Interval  Training   Interval Training No   Treadmill   MPH 1.5   Grade 0   Minutes 10   NuStep   Level 2   Watts 20   Minutes 16   METs 1.8   REL-XR   Level 2   Watts 40   Minutes 10   METs 4       Nutrition:  Target Goals: Understanding of nutrition guidelines, daily intake of sodium '1500mg'$ , cholesterol <  $'200mg'v$ , calories 30% from fat and 7% or less from saturated fats, daily to have 5 or more servings of fruits and vegetables.  Biometrics:    Nutrition Therapy Plan and Nutrition Goals:     Nutrition Therapy & Goals - 06/30/15 1157    Nutrition Therapy   Diet Ms Bazzano prefers not to meet with the dietitian. She has lost 20lbs due to her bronchiectasis. Supplements, such as boost, Ensure make her have diarrhea. She cooks healthy and does eat out occasionally.      Nutrition Discharge: Rate Your Plate Scores:   Psychosocial: Target Goals: Acknowledge presence or absence of depression, maximize coping skills, provide positive support system. Participant is able to verbalize types and ability to use techniques and skills needed for reducing stress and depression.  Initial Review & Psychosocial Screening:     Initial Psych Review & Screening - 06/30/15 0915    Family Dynamics   Good Support System? Yes   Comments Ms Tarver has good support from her son and daughter. Her sisters and brothers live close, so Ms Scaffidi has help with house chores. She does take care of her husband which is very difficult, especially with her shortness of breath.  She is hopeful that pul rehab will help improve her quality of life.                                Barriers   Psychosocial barriers to participate in program The patient should benefit from training in stress management and relaxation.   Screening Interventions   Interventions Program counselor consult;Encouraged to exercise      Quality of Life Scores:     Quality of Life - 06/30/15 0915    Quality of Life Scores    Health/Function Pre 15.75 %   Socioeconomic Pre 30 %   Psych/Spiritual Pre 20.57 %   Family Pre 26.4 %   GLOBAL Pre 21.09 %      PHQ-9:     Recent Review Flowsheet Data    Depression screen [facility] 2/9 06/30/2015   Decreased Interest 0   Down, Depressed, Hopeless 0   PHQ - 2 Score 0   Altered sleeping 2   Tired, decreased energy 3   Change in appetite 3   Feeling bad or failure about yourself  0   Trouble concentrating 1   Moving slowly or fidgety/restless 2   Suicidal thoughts 0   PHQ-9 Score 11   Difficult doing work/chores Very difficult      Psychosocial Evaluation and Intervention:     Psychosocial Evaluation - 07/13/15 1036    Psychosocial Evaluation & Interventions   Interventions Encouraged to exercise with the program and follow exercise prescription   Comments Counselor met with Ms. McCalister today for initial psychosocial evaluation.  She is a 70 year old who has had lung disease since 1990 and was on antibiotics for almost 20 years.  As a result, she became immune to their benefits and has struggled more since 2016.  She has a strong support system with adult children who live close by and a strong faith community who support her.   Ms. Bouie has a spouse as well, but he is disabled and requires a great deal of support himself.  She reports not sleeping well at all and "the Doctors won't give me anything because of my breathing."  She also states her appetite has diminished significantly over the  past year and she has lost a great deal of weight.  She denies a history of depression or current symptoms, although she was tearful and states she does "get depressed sometime due to her health."  Ms. Lehnen struggles with her mood and admits her health is her primary stressor.  She has goals to gain weight and get stronger so she can enjoy life!  Counselor will follow with Ms. Placke on these goals and on her mood issues.  Counselor also recommended speaking with her  doctor or pharmacist about a natural OTC sleep aid to help with this.  Also Ms. Urquidi has an appointment to meet with the dietician for weight gain tips next week.     Continued Psychosocial Services Needed Yes  Counselor will follow with Ms. Lineback re: her mood while in this program to see if it improves as her health improves.  If not, counselor mentioned recommending she see a therapist for ongoing counseling services, and  speak to her Dr. Fonnie Birkenhead: a RX eval.      Psychosocial Re-Evaluation:  Education: Education Goals: Education classes will be provided on a weekly basis, covering required topics. Participant will state understanding/return demonstration of topics presented.  Learning Barriers/Preferences:     Learning Barriers/Preferences - 06/30/15 0915    Learning Barriers/Preferences   Learning Barriers None   Learning Preferences Group Instruction;Individual Instruction;Pictoral;Skilled Demonstration;Verbal Instruction;Video;Written Material      Education Topics: Initial Evaluation Education: - Verbal, written and demonstration of respiratory meds, RPE/PD scales, oximetry and breathing techniques. Instruction on use of nebulizers and MDIs: cleaning and proper use, rinsing mouth with steroid doses and importance of monitoring MDI activations.          Pulmonary Rehab from 06/30/2015 in [facility] Cardiac and Pulmonary Rehab   Date  06/30/15   Educator  LB   Instruction Review Code  2- meets goals/outcomes      General Nutrition Guidelines/Fats and Fiber: -Group instruction provided by verbal, written material, models and posters to present the general guidelines for heart healthy nutrition. Gives an explanation and review of dietary fats and fiber.   Controlling Sodium/Reading Food Labels: -Group verbal and written material supporting the discussion of sodium use in heart healthy nutrition. Review and explanation with models, verbal and written materials for utilization of  the food label.   Exercise Physiology & Risk Factors: - Group verbal and written instruction with models to review the exercise physiology of the cardiovascular system and associated critical values. Details cardiovascular disease risk factors and the goals associated with each risk factor.   Aerobic Exercise & Resistance Training: - Gives group verbal and written discussion on the health impact of inactivity. On the components of aerobic and resistive training programs and the benefits of this training and how to safely progress through these programs.   Flexibility, Balance, General Exercise Guidelines: - Provides group verbal and written instruction on the benefits of flexibility and balance training programs. Provides general exercise guidelines with specific guidelines to those with heart or lung disease. Demonstration and skill practice provided.   Stress Management: - Provides group verbal and written instruction about the health risks of elevated stress, cause of high stress, and healthy ways to reduce stress.   Depression: - Provides group verbal and written instruction on the correlation between heart/lung disease and depressed mood, treatment options, and the stigmas associated with seeking treatment.   Exercise & Equipment Safety: - Individual verbal instruction and demonstration of equipment use and safety with use  of the equipment.   Infection Prevention: - Provides verbal and written material to individual with discussion of infection control including proper hand washing and proper equipment cleaning during exercise session.      Pulmonary Rehab from 06/30/2015 in [facility] Cardiac and Pulmonary Rehab   Date  06/30/15   Educator  LB   Instruction Review Code  2- meets goals/outcomes      Falls Prevention: - Provides verbal and written material to individual with discussion of falls prevention and safety.      Pulmonary Rehab from 06/30/2015 in [facility] Cardiac and Pulmonary  Rehab   Date  06/30/15   Educator  LB   Instruction Review Code  2- meets goals/outcomes      Diabetes: - Individual verbal and written instruction to review signs/symptoms of diabetes, desired ranges of glucose level fasting, after meals and with exercise. Advice that pre and post exercise glucose checks will be done for 3 sessions at entry of program.   Chronic Lung Diseases: - Group verbal and written instruction to review new updates, new respiratory medications, new advancements in procedures and treatments. Provide informative websites and "800" numbers of self-education.   Lung Procedures: - Group verbal and written instruction to describe testing methods done to diagnose lung disease. Review the outcome of test results. Describe the treatment choices: Pulmonary Function Tests, ABGs and oximetry.   Energy Conservation: - Provide group verbal and written instruction for methods to conserve energy, plan and organize activities. Instruct on pacing techniques, use of adaptive equipment and posture/positioning to relieve shortness of breath.   Triggers: - Group verbal and written instruction to review types of environmental controls: home humidity, furnaces, filters, dust mite/pet prevention, HEPA vacuums. To discuss weather changes, air quality and the benefits of nasal washing.   Exacerbations: - Group verbal and written instruction to provide: warning signs, infection symptoms, calling MD promptly, preventive modes, and value of vaccinations. Review: effective airway clearance, coughing and/or vibration techniques. Create an Sports administrator.   Oxygen: - Individual and group verbal and written instruction on oxygen therapy. Includes supplement oxygen, available portable oxygen systems, continuous and intermittent flow rates, oxygen safety, concentrators, and Medicare reimbursement for oxygen.   Respiratory Medications: - Group verbal and written instruction to review medications  for lung disease. Drug class, frequency, complications, importance of spacers, rinsing mouth after steroid MDI's, and proper cleaning methods for nebulizers.      Pulmonary Rehab from 06/30/2015 in [facility] Cardiac and Pulmonary Rehab   Date  06/30/15   Educator  LB   Instruction Review Code  2- meets goals/outcomes      AED/CPR: - Group verbal and written instruction with the use of models to demonstrate the basic use of the AED with the basic ABC's of resuscitation.   Breathing Retraining: - Provides individuals verbal and written instruction on purpose, frequency, and proper technique of diaphragmatic breathing and pursed-lipped breathing. Applies individual practice skills.      Pulmonary Rehab from 06/30/2015 in [facility] Cardiac and Pulmonary Rehab   Date  06/30/15   Educator  LB   Instruction Review Code  2- meets goals/outcomes      Anatomy and Physiology of the Lungs: - Group verbal and written instruction with the use of models to provide basic lung anatomy and physiology related to function, structure and complications of lung disease.   Heart Failure: - Group verbal and written instruction on the basics of heart failure: signs/symptoms, treatments, explanation of ejection fraction, enlarged heart and  cardiomyopathy.   Sleep Apnea: - Individual verbal and written instruction to review Obstructive Sleep Apnea. Review of risk factors, methods for diagnosing and types of masks and machines for OSA.   Anxiety: - Provides group, verbal and written instruction on the correlation between heart/lung disease and anxiety, treatment options, and management of anxiety.   Relaxation: - Provides group, verbal and written instruction about the benefits of relaxation for patients with heart/lung disease. Also provides patients with examples of relaxation techniques.   Knowledge Questionnaire Score:     Knowledge Questionnaire Score - 06/30/15 0915    Knowledge Questionnaire Score   Pre  Score 9/10       Core Components/Risk Factors/Patient Goals at Admission:     Personal Goals and Risk Factors at Admission - 06/30/15 1157    Core Components/Risk Factors/Patient Goals on Admission    Weight Management --  Ms Tabron prefers not to meet with the dietitian. She has lost 20lbs due to her bronchiectasis. Supplements, such as boost, Ensure make her have diarrhea. She cooks healthy and does eat out occasionally.      Core Components/Risk Factors/Patient Goals Review:      Goals and Risk Factor Review      07/06/15 1000 07/08/15 1251         Core Components/Risk Factors/Patient Goals Review   Personal Goals Review Develop more efficient breathing techniques such as purse lipped breathing and diaphragmatic breathing and practicing self-pacing with activity.       Review Reviewed PLB technique; good demonstration of PLB and used it with her exercise goals Ms Alois Cliche arrived very shor of breath. We discussed pacing herself and sing PLB walking to class. After she rested, she did very good with her exercise goals. She is now 108.9lbx and has a very poor appetitie. She would now like to meet with the dietitian  for options to help her gain weight; even drinks like Boost upset her stomach.      Expected Outcomes Continue using PLB with exercise goals and activity at home.          Core Components/Risk Factors/Patient Goals at Discharge (Final Review):      Goals and Risk Factor Review - 07/08/15 1251    Core Components/Risk Factors/Patient Goals Review   Review Ms Alois Cliche arrived very shor of breath. We discussed pacing herself and sing PLB walking to class. After she rested, she did very good with her exercise goals. She is now 108.9lbx and has a very poor appetitie. She would now like to meet with the dietitian  for options to help her gain weight; even drinks like Boost upset her stomach.      ITP Comments:     ITP Comments      07/10/15 1121           ITP  Comments Today [name] complained of feeling dizzy after she moved from the NuStep to the treadmill. She sat down and rested for a few minutes.  BP check revealed 88/60. Skin warm and dry.   Given 2 full cups of water before she felt better.  BP reading after the water 124/76. She resumed her exercise and no more concerns occurred.  Talked to her about staying hydrated before, during andafter class.  She voiced understanding.           Comments: 30 day review

## 2015-07-15 DIAGNOSIS — J479 Bronchiectasis, uncomplicated: Secondary | ICD-10-CM

## 2015-07-15 NOTE — Progress Notes (Signed)
Daily Session Note  Patient Details  Name: Keyleen J Stratton MRN: [id number] Date of Birth: 08/09/1945 Referring Provider:        Pulmonary Rehab from 07/08/2015 in [facility] Cardiac and Pulmonary Rehab   Referring Provider  Hedrick, James      Encounter Date: 07/15/2015  Check In:     Session Check In - 07/15/15 1127    Check-In   Location [facility]-Cardiac & Pulmonary Rehab   Staff Present Susanne Bice, RN, BSN, CCRP;Laureen Brown, BS, RRT, Respiratory Therapist; , BA, ACSM CEP, Exercise Physiologist   Supervising physician immediately available to respond to emergencies [facility] immediately available ER MD   Physician(s) Goodman and Lord   Medication changes reported     No   Fall or balance concerns reported    No   Warm-up and Cool-down Performed on first and last piece of equipment   Resistance Training Performed Yes   VAD Patient? No   Pain Assessment   Currently in Pain? No/denies   Multiple Pain Sites No         Goals Met:  Proper associated with RPD/PD & O2 Sat Independence with exercise equipment Exercise tolerated well Strength training completed today  Goals Unmet:  Not Applicable  Comments: Pt able to follow exercise prescription today without complaint.  Will continue to monitor for progression.    Dr. Mark Miller is Medical Director for [facility] and [facility]. 

## 2015-07-17 ENCOUNTER — Encounter: Payer: Medicare Other | Admitting: Respiratory Therapy

## 2015-07-17 DIAGNOSIS — J479 Bronchiectasis, uncomplicated: Secondary | ICD-10-CM

## 2015-07-17 NOTE — Progress Notes (Signed)
Daily Session Note  Patient Details  Name: Ana Washington MRN: [id number] Date of Birth: [birth year] Referring Provider:        Pulmonary Rehab from 07/08/2015 in [facility] Cardiac and Pulmonary Rehab   Referring Provider  [name]      Encounter Date: 07/17/2015  Check In:     Session Check In - 07/17/15 1230    Check-In   Location [facility]-Cardiac & Pulmonary Rehab   Staff Present Heath Lark, RN, BSN, CCRP;Kenyatta Gloeckner Blanch Media, RRT, RCP, Respiratory Therapist;Carroll Enterkin, RN, BSN   Supervising physician immediately available to respond to emergencies [facility] immediately available ER MD   Physician(s) Dr. Marcelene Butte and Jacqualine Code   Medication changes reported     No   Fall or balance concerns reported    No   Warm-up and Cool-down Performed on first and last piece of equipment   Resistance Training Performed Yes   VAD Patient? No   Pain Assessment   Currently in Pain? No/denies           Exercise Prescription Changes - 07/17/15 1200    Response to Exercise   Duration Progress to 30 minutes of continuous aerobic without signs/symptoms of physical distress   Intensity THRR unchanged   Progression   Progression Continue to progress workloads to maintain intensity without signs/symptoms of physical distress.   Average METs 2.6   Resistance Training   Training Prescription Yes   Weight 2   Reps 10-12   Interval Training   Interval Training No   Treadmill   MPH 2   Grade 1   Minutes 10   NuStep   Level 2   Watts 20   Minutes 16   METs 1.8   REL-XR   Level 2   Watts 50   Minutes 10   METs 4      Goals Met:  Proper associated with RPD/PD & O2 Sat Independence with exercise equipment Using PLB without cueing & demonstrates good technique Exercise tolerated well Personal goals reviewed Strength training completed today  Goals Unmet:  Not Applicable  Comments: Pt able to follow exercise prescription today without complaint.  Will continue to monitor for  progression.  Ana Washington is coughing a lot.  She says this is normal for her.  I gave her a flutter valve to use at home.    Dr. Emily Filbert is Medical Director for [place] and [facility].

## 2015-07-20 ENCOUNTER — Encounter: Payer: Medicare Other | Admitting: *Deleted

## 2015-07-20 DIAGNOSIS — J479 Bronchiectasis, uncomplicated: Secondary | ICD-10-CM

## 2015-07-20 NOTE — Progress Notes (Signed)
Daily Session Note  Patient Details  Name: Ana Washington MRN: [id number] Date of Birth: 04/06/1945 Referring Provider:        Pulmonary Rehab from 07/08/2015 in [facility] Cardiac and Pulmonary Rehab   Referring Provider  [name]      Encounter Date: 07/20/2015  Check In:     Session Check In - 07/20/15 1217    Check-In   Staff Present Carson Myrtle, BS, RRT, Respiratory Bertis Ruddy, BS, ACSM CEP, Exercise Physiologist;Jessica Luan Pulling, MA, ACSM RCEP, Exercise Physiologist   Supervising physician immediately available to respond to emergencies [facility] immediately available ER MD   Physician(s) Dr. Clearnce Hasten and Dr. Joni Fears   Medication changes reported     No   Fall or balance concerns reported    No   Warm-up and Cool-down Performed on first and last piece of equipment   Resistance Training Performed Yes   VAD Patient? No   Pain Assessment   Currently in Pain? No/denies   Multiple Pain Sites No         Goals Met:  Proper associated with RPD/PD & O2 Sat Independence with exercise equipment Exercise tolerated well Personal goals reviewed Strength training completed today  Goals Unmet:  Not Applicable  Comments: Reviewed individualized exercise prescription and made increases per departmental policy. Exercise increases were discussed with the patient and they were able to perform the new work loads without issue (no signs or symptoms).       Dr. Emily Filbert is Medical Director for [place] and [facility].

## 2015-07-24 DIAGNOSIS — J479 Bronchiectasis, uncomplicated: Secondary | ICD-10-CM

## 2015-07-24 NOTE — Progress Notes (Signed)
Daily Session Note  Patient Details  Name: Ana Washington MRN: [id number] Date of Birth: March 14, 1945 Referring Provider:        Pulmonary Rehab from 07/08/2015 in [facility] Cardiac and Pulmonary Rehab   Referring Provider  [name]      Encounter Date: 07/24/2015  Check In:     Session Check In - 07/24/15 1051    Check-In   Location [facility]-Cardiac & Pulmonary Rehab   Staff Present Alberteen Sam, MA, ACSM RCEP, Exercise Physiologist;Knowledge Escandon Oletta Darter, BA, ACSM CEP, Exercise Physiologist;Stacey Blanch Media, RRT, RCP, Respiratory Therapist   Supervising physician immediately available to respond to emergencies [facility] immediately available ER MD   Physician(s) Jimmye Norman and Reita Cliche   Medication changes reported     Yes   Comments Started doxycycline 100 MG for 10 days, mirtazapine 7.5 mg, d/c amlodipine 5 mg, hydrocone chlorpheneramine    Fall or balance concerns reported    No   Warm-up and Cool-down Performed on first and last piece of equipment   Resistance Training Performed Yes   VAD Patient? No   Pain Assessment   Currently in Pain? No/denies   Multiple Pain Sites No         Goals Met:  Proper associated with RPD/PD & O2 Sat Independence with exercise equipment Exercise tolerated well Strength training completed today  Goals Unmet:  Not Applicable  Comments: Pt able to follow exercise prescription today without complaint.  Will continue to monitor for progression.    Dr. Emily Filbert is Medical Director for [place] and [facility].

## 2015-07-27 ENCOUNTER — Encounter: Payer: Medicare Other | Admitting: *Deleted

## 2015-07-27 DIAGNOSIS — J479 Bronchiectasis, uncomplicated: Secondary | ICD-10-CM

## 2015-07-27 NOTE — Progress Notes (Signed)
Daily Session Note  Patient Details  Name: Ana Washington MRN: [id number] Date of Birth: May 11, 1945 Referring Provider:        Pulmonary Rehab from 07/08/2015 in [facility] Cardiac and Pulmonary Rehab   Referring Provider  [name]      Encounter Date: 07/27/2015  Check In:     Session Check In - 07/27/15 1134    Check-In   Location [facility]-Cardiac & Pulmonary Rehab   Staff Present Carson Myrtle, BS, RRT, Respiratory Bertis Ruddy, BS, ACSM CEP, Exercise Physiologist;Mary Kellie Shropshire, RN, BSN, MA   Supervising physician immediately available to respond to emergencies [facility] immediately available ER MD   Physician(s) Marcelene Butte and Corky Downs   Medication changes reported     No   Fall or balance concerns reported    No   Warm-up and Cool-down Performed on first and last piece of equipment   Resistance Training Performed Yes   VAD Patient? No   Pain Assessment   Currently in Pain? No/denies   Multiple Pain Sites No         Goals Met:  Proper associated with RPD/PD & O2 Sat Independence with exercise equipment Exercise tolerated well Strength training completed today  Goals Unmet:  Not Applicable  Comments: Patient completed exercise prescription and all exercise goals during rehab session. The exercise was tolerated well and the patient is progressing in the program.     Dr. Emily Filbert is Medical Director for [place] and [facility].

## 2015-07-29 DIAGNOSIS — J479 Bronchiectasis, uncomplicated: Secondary | ICD-10-CM | POA: Diagnosis not present

## 2015-07-29 NOTE — Progress Notes (Signed)
Daily Session Note  Patient Details  Name: Ana Washington MRN: [id number] Date of Birth: [birth year] Referring Provider:        Pulmonary Rehab from 07/08/2015 in [facility] Cardiac and Pulmonary Rehab   Referring Provider  [name]      Encounter Date: 07/29/2015  Check In:     Session Check In - 07/29/15 1245    Check-In   Location [facility]-Cardiac & Pulmonary Rehab   Staff Present Nyoka Cowden, RN, BSN, Walden Field, BS, RRT, Respiratory Dareen Piano, BA, ACSM CEP, Exercise Physiologist   Supervising physician immediately available to respond to emergencies [facility] immediately available ER MD   Physician(s) Jimmye Norman and Edd Fabian   Medication changes reported     No   Fall or balance concerns reported    No   Warm-up and Cool-down Performed on first and last piece of equipment   Resistance Training Performed Yes   VAD Patient? No   Pain Assessment   Currently in Pain? No/denies   Multiple Pain Sites No         Goals Met:  Proper associated with RPD/PD & O2 Sat Independence with exercise equipment Exercise tolerated well Strength training completed today  Goals Unmet:  Not Applicable  Comments: Pt able to follow exercise prescription today without complaint.  Will continue to monitor for progression.    Dr. Emily Filbert is Medical Director for [place] and [facility].

## 2015-07-31 ENCOUNTER — Encounter: Payer: Medicare Other | Admitting: *Deleted

## 2015-07-31 DIAGNOSIS — J479 Bronchiectasis, uncomplicated: Secondary | ICD-10-CM | POA: Diagnosis not present

## 2015-07-31 NOTE — Progress Notes (Signed)
Daily Session Note  Patient Details  Name: DEJHA KING MRN: [id number] Date of Birth: Oct 27, 1945 Referring Provider:        Pulmonary Rehab from 07/08/2015 in [facility] Cardiac and Pulmonary Rehab   Referring Provider  [name]      Encounter Date: 07/31/2015  Check In:     Session Check In - 07/31/15 1104    Check-In   Location [facility]-Cardiac & Pulmonary Rehab   Staff Present Jeanell Sparrow, DPT, CEEA;Heath Lark, RN, BSN, CCRP;Marquesa Rath, RN, BSN   Supervising physician immediately available to respond to emergencies [facility] immediately available ER MD   Physician(s) Dr. Jimmye Norman Dr. Corky Downs   Medication changes reported     No   Fall or balance concerns reported    No   Warm-up and Cool-down Performed on first and last piece of equipment   Resistance Training Performed Yes   VAD Patient? No   Pain Assessment   Currently in Pain? No/denies         Goals Met:  Proper associated with RPD/PD & O2 Sat Exercise tolerated well  Goals Unmet:  Not Applicable  Comments:     Dr. Emily Filbert is Medical Director for [place] and [facility].

## 2015-08-03 ENCOUNTER — Encounter: Payer: Medicare Other | Attending: Family Medicine | Admitting: *Deleted

## 2015-08-03 DIAGNOSIS — J479 Bronchiectasis, uncomplicated: Secondary | ICD-10-CM | POA: Insufficient documentation

## 2015-08-03 NOTE — Progress Notes (Signed)
Daily Session Note  Patient Details  Name: Ana Washington MRN: [id number] Date of Birth: [birth year] Referring Provider:        Pulmonary Rehab from 07/08/2015 in [facility] Cardiac and Pulmonary Rehab   Referring Provider  [name]      Encounter Date: 08/03/2015  Check In:     Session Check In - 08/03/15 1235    Check-In   Location [facility]-Cardiac & Pulmonary Rehab   Staff Present Carson Myrtle, BS, RRT, Respiratory Therapist;Mary Kellie Shropshire, RN, BSN, [place]ita [place], BS, ACSM CEP, Exercise Physiologist   Supervising physician immediately available to respond to emergencies [facility] immediately available ER MD   Physician(s) Jimmye Norman and Joni Fears   Medication changes reported     No   Fall or balance concerns reported    No   Warm-up and Cool-down Performed on first and last piece of equipment   Resistance Training Performed Yes   VAD Patient? No   Pain Assessment   Currently in Pain? No/denies   Multiple Pain Sites No         Goals Met:  Proper associated with RPD/PD & O2 Sat Independence with exercise equipment Exercise tolerated well Personal goals reviewed Strength training completed today  Goals Unmet:  Not Applicable  Comments: Patient completed exercise prescription and all exercise goals during rehab session. The exercise was tolerated well and the patient is progressing in the program.     Dr. Emily Filbert is Medical Director for [place] and [facility].

## 2015-08-05 ENCOUNTER — Encounter: Payer: Medicare Other | Admitting: *Deleted

## 2015-08-05 DIAGNOSIS — J479 Bronchiectasis, uncomplicated: Secondary | ICD-10-CM

## 2015-08-05 NOTE — Progress Notes (Signed)
Daily Session Note  Patient Details  Name: Ana Washington MRN: [id number] Date of Birth: 1945-05-09 Referring Provider:        Pulmonary Rehab from 07/08/2015 in [facility] Cardiac and Pulmonary Rehab   Referring Provider  [name]      Encounter Date: 08/05/2015  Check In:     Session Check In - 08/05/15 1125    Check-In   Location [facility]-Cardiac & Pulmonary Rehab   Staff Present Alberteen Sam, MA, ACSM RCEP, Exercise Physiologist;Amanda Oletta Darter, BA, ACSM CEP, Exercise Physiologist;Laureen Owens Shark, BS, RRT, Respiratory Therapist   Supervising physician immediately available to respond to emergencies [facility] immediately available ER MD   Physician(s) Alfred Levins and Marcelene Butte   Medication changes reported     No   Fall or balance concerns reported    No   Warm-up and Cool-down Performed on first and last piece of equipment   Resistance Training Performed Yes   VAD Patient? No   Pain Assessment   Currently in Pain? No/denies   Multiple Pain Sites No         Goals Met:  Proper associated with RPD/PD & O2 Sat Independence with exercise equipment Using PLB without cueing & demonstrates good technique Exercise tolerated well Strength training completed today  Goals Unmet:  Not Applicable  Comments: Pt able to follow exercise prescription today without complaint.  Will continue to monitor for progression.    Dr. Emily Filbert is Medical Director for [name].

## 2015-08-07 ENCOUNTER — Encounter: Payer: Medicare Other | Admitting: *Deleted

## 2015-08-07 DIAGNOSIS — J479 Bronchiectasis, uncomplicated: Secondary | ICD-10-CM | POA: Diagnosis not present

## 2015-08-07 NOTE — Progress Notes (Signed)
Daily Session Note  Patient Details  Name: Ana Washington MRN: [id number] Date of Birth: 10-07-1945 Referring Provider:        Pulmonary Rehab from 07/08/2015 in [facility] Cardiac and Pulmonary Rehab   Referring Provider  [name]      Encounter Date: 08/07/2015  Check In:     Session Check In - 08/07/15 1048    Check-In   Location [facility]-Cardiac & Pulmonary Rehab   Staff Present Nyoka Cowden, RN, BSN, Walden Field, BS, RRT, Respiratory Therapist;Agape Hardiman, RN, BSN   Supervising physician immediately available to respond to emergencies [facility] immediately available ER MD   Physician(s) Dr. Joni Fears, and Dr. Edd Fabian   Medication changes reported     No   Fall or balance concerns reported    No   Warm-up and Cool-down Performed on first and last piece of equipment   Resistance Training Performed Yes   VAD Patient? No   Pain Assessment   Currently in Pain? No/denies         Goals Met:  Proper associated with RPD/PD & O2 Sat Exercise tolerated well  Goals Unmet:  Not Applicable  Comments:  [name] has a chronic cough that her MD is treating but continues to do well exercising in [facility]    Dr. Emily Filbert is Medical Director for [place] and [facility].

## 2015-08-10 ENCOUNTER — Encounter: Payer: Self-pay | Admitting: *Deleted

## 2015-08-10 ENCOUNTER — Encounter: Payer: Medicare Other | Admitting: *Deleted

## 2015-08-10 DIAGNOSIS — J479 Bronchiectasis, uncomplicated: Secondary | ICD-10-CM

## 2015-08-10 NOTE — Progress Notes (Signed)
Daily Session Note  Patient Details  Name: Ana Washington MRN: [id number] Date of Birth: 06-05-1945 Referring Provider:        Pulmonary Rehab from 07/08/2015 in [facility] Cardiac and Pulmonary Rehab   Referring Provider  [name]      Encounter Date: 08/10/2015  Check In:     Session Check In - 08/10/15 1102    Check-In   Location [facility]-Cardiac & Pulmonary Rehab   Staff Present Carson Myrtle, BS, RRT, Respiratory Therapist;Deshae Dickison Amedeo Plenty, BS, ACSM CEP, Exercise Physiologist;Jessica Luan Pulling, MA, ACSM RCEP, Exercise Physiologist   Supervising physician immediately available to respond to emergencies [facility] immediately available ER MD   Physician(s) Darl Householder and Corky Downs   Medication changes reported     No   Fall or balance concerns reported    No   Warm-up and Cool-down Performed on first and last piece of equipment   Resistance Training Performed Yes   VAD Patient? No   Pain Assessment   Currently in Pain? No/denies   Multiple Pain Sites No         Goals Met:  Proper associated with RPD/PD & O2 Sat Independence with exercise equipment Exercise tolerated well Strength training completed today  Goals Unmet:  Not Applicable  Comments: Patient completed exercise prescription and all exercise goals during rehab session. The exercise was tolerated well and the patient is progressing in the program.   Educated on A-Fib. See ITP comment.    Dr. Emily Filbert is Medical Director for [place] and [facility].

## 2015-08-10 NOTE — Progress Notes (Signed)
Pulmonary Individual Treatment Plan  Patient Details  Name: Ana Washington MRN: [id number] Date of Birth: 1945-07-09 Referring Provider:        Pulmonary Rehab from 07/08/2015 in [facility] Cardiac and Pulmonary Rehab   Referring Provider  [name]      Initial Encounter Date:       Pulmonary Rehab from 07/08/2015 in [facility] Cardiac and Pulmonary Rehab   Referring Provider  [name]      Visit Diagnosis: Bronchiectasis without complication ([place])  Patient's Home Medications on Admission:  Current outpatient prescriptions:  .  alendronate (FOSAMAX) 70 MG tablet, take 1 tablet by mouth every week, Disp: , Rfl:  .  amLODipine (NORVASC) 5 MG tablet, take 1 tablet by mouth once daily, Disp: , Rfl:  .  azithromycin (ZITHROMAX) 250 MG tablet, Take by mouth., Disp: , Rfl:  .  Calcium Carbonate-Vitamin D (OYSTER SHELL/VITAMIN D) 600-125 MG-UNIT TABS, Take by mouth., Disp: , Rfl:  .  Cholecalciferol (VITAMIN D-1000 MAX ST) 1000 UNITS tablet, Take by mouth., Disp: , Rfl:  .  ciprofloxacin (CIPRO) 500 MG tablet, Take by mouth., Disp: , Rfl:  .  diclofenac sodium (VOLTAREN) 1 % GEL, Apply 2 g topically 4 (four) times daily., Disp: 100 g, Rfl: 1 .  fluocinonide (LIDEX) 0.05 % external solution, prn as needed, Disp: , Rfl:  .  fluticasone (FLONASE) 50 MCG/ACT nasal spray, Place into the nose., Disp: , Rfl:  .  hydrochlorothiazide (MICROZIDE) 12.5 MG capsule, take 1 capsule by mouth once daily, Disp: , Rfl:  .  ketoconazole (NIZORAL) 2 % shampoo, Apply topically., Disp: , Rfl:  .  losartan (COZAAR) 100 MG tablet, take 1 tablet by mouth once daily, Disp: , Rfl:  .  metoprolol succinate (TOPROL-XL) 50 MG 24 hr tablet, Take by mouth., Disp: , Rfl:  .  omeprazole (PRILOSEC OTC) 20 MG tablet, Take by mouth., Disp: , Rfl:  .  rivaroxaban (XARELTO) 20 MG TABS tablet, Take by mouth., Disp: , Rfl:  .  triamcinolone cream (KENALOG) 0.1 %, prn as needed, Disp: , Rfl:   Past Medical History: Past Medical  History  Diagnosis Date  . Hypertension   . Lung disease   . Essential hypertension, malignant 10/03/2013    Tobacco Use: History  Smoking status  . Never Smoker   Smokeless tobacco  . Not on file    Labs: Recent Review Flowsheet Data    There is no flowsheet data to display.       ADL UCSD:     Pulmonary Assessment Scores      06/30/15 1032       ADL UCSD   SOB Score total 77     Rest 1     Walk 3     Stairs 4     Bath 4     Dress 3     Shop 4        Pulmonary Function Assessment:     Pulmonary Function Assessment - 06/30/15 0915    Initial Spirometry Results   FVC% 37 %   FEV1% 42 %   FEV1/FVC Ratio 86.96   Post Bronchodilator Spirometry Results   FVC% 49 %   FEV1% 46 %   FEV1/FVC Ratio 80.79   Breath   Bilateral Breath Sounds Clear;Decreased   Shortness of Breath Yes;Fear of Shortness of Breath;Limiting activity      Exercise Target Goals:    Exercise Program Goal: Individual exercise prescription set with THRR, safety &  activity barriers. Participant demonstrates ability to understand and report RPE using BORG scale, to self-measure pulse accurately, and to acknowledge the importance of the exercise prescription.  Exercise Prescription Goal: Starting with aerobic activity 30 plus minutes a day, 3 days per week for initial exercise prescription. Provide home exercise prescription and guidelines that participant acknowledges understanding prior to discharge.  Activity Barriers & Risk Stratification:     Activity Barriers & Cardiac Risk Stratification - 06/30/15 0915    Activity Barriers & Cardiac Risk Stratification   Activity Barriers Shortness of Breath;Deconditioning;Muscular Weakness;Other (comment)   Comments Chronic cough   Cardiac Risk Stratification High      6 Minute Walk:     6 Minute Walk      06/30/15 1128       6 Minute Walk   Distance 912 feet     Walk Time 4.73 minutes     # of Rest Breaks 1  rest 1:16      MPH  2.19     METS 1.5     RPE 15     Perceived Dyspnea  7     Symptoms Yes (comment)     Comments shortness of breath     Resting HR 80 bpm     Resting BP 120/80 mmHg     Max Ex. HR 114 bpm     Max Ex. BP 158/90 mmHg     2 Minute Post BP 126/78 mmHg     Interval HR   Baseline HR 80     1 Minute HR 98     2 Minute HR 104     3 Minute HR 111     5 Minute HR 114     6 Minute HR 114     2 Minute Post HR 88     Interval Heart Rate? Yes     Interval Oxygen   Interval Oxygen? Yes     Baseline Oxygen Saturation % 96 %     1 Minute Oxygen Saturation % 97 %     2 Minute Oxygen Saturation % 93 %     3 Minute Oxygen Saturation % 93 %     5 Minute Oxygen Saturation % 91 %     6 Minute Oxygen Saturation % 93 %     2 Minute Post Oxygen Saturation % 98 %        Initial Exercise Prescription:     Initial Exercise Prescription - 07/08/15 1400    Date of Initial Exercise RX and Referring Provider   Referring Provider [name]      Perform Capillary Blood Glucose checks as needed.  Exercise Prescription Changes:     Exercise Prescription Changes      07/06/15 1500 07/17/15 1200 07/21/15 1300 08/05/15 1300 08/07/15 1000   Exercise Review   Progression   Yes Yes Yes   Response to Exercise   Blood Pressure (Admit) 124/66 mmHg  110/70 mmHg 116/68 mmHg 116/68 mmHg   Blood Pressure (Exercise) 118/60 mmHg  130/68 mmHg 130/70 mmHg 130/70 mmHg   Blood Pressure (Exit) 102/80 mmHg  100/66 mmHg 102/60 mmHg 102/60 mmHg   Heart Rate (Admit) 91 bpm  103 bpm 83 bpm 83 bpm   Heart Rate (Exercise) 87 bpm  98 bpm 101 bpm 101 bpm   Heart Rate (Exit) 79 bpm  87 bpm 90 bpm 90 bpm   Oxygen Saturation (Admit) 96 %  95 % 96 % 96 %   Oxygen  Saturation (Exercise) 94 %  91 % 92 % 92 %   Oxygen Saturation (Exit) 95 %  99 % 96 % 96 %   Rating of Perceived Exertion (Exercise) _0 Perceived Dyspnea (Exercise) _1 Symptoms none  none None None   Duration Progress to 30 minutes of  continuous aerobic without signs/symptoms of physical distress Progress to 30 minutes of continuous aerobic without signs/symptoms of physical distress Progress to 45 minutes of aerobic exercise without signs/symptoms of physical distress Progress to 45 minutes of aerobic exercise without signs/symptoms of physical distress Progress to 45 minutes of aerobic exercise without signs/symptoms of physical distress   Intensity _2    Progression   Progression Continue to progress workloads to maintain intensity without signs/symptoms of physical distress. Continue to progress workloads to maintain intensity without signs/symptoms of physical distress. Continue to progress workloads to maintain intensity without signs/symptoms of physical distress. Continue to progress workloads to maintain intensity without signs/symptoms of physical distress. Continue to progress workloads to maintain intensity without signs/symptoms of physical distress.   Average METs 2.6 2.6 2.94 3.34 3.34   Resistance Training   Training Prescription _3    Weight _4 Reps 10-12 10-12 10-12 10-12 10-12   Interval Training   Interval Training _5    Treadmill   MPH 1._6 Grade 0 _7 Minutes _8 METs   2.81 2.81 2.81   NuStep   Level _9 Watts _10 42 42   Minutes _11 METs 1.8 1.8 2.3 3.5 3.5   REL-XR   Level _12 Watts 40 50 50 52 52   Minutes _13 METs 4 4 3.7 3.9 3.9      Exercise Comments:     Exercise Comments      07/06/15 1201 07/07/15 1355 07/08/15 1400 07/13/15 1242 07/20/15 1219   Exercise Comments Today was the patient's first day of class. The patient's initial exercise prescription (based on the 6 min walk evaluation) was reviewed with the patient. The patient tolerated her exercise well with no symptoms or complications. All vitals  were within acceptable ranges.  [name] is off to a good start for exercise.  We will continue to monitor for progression. Ms Abdulaziz has doen well her first sessions and has been able to complete continous 10 min bouts on each machine.   Increases were made on the TM for patient. Changes were discussed with patient and tolerated well.  Increases made on the NS for the patient today. Changes were discussed with patient and tolerated well.     07/21/15 1358 08/05/15 1330         Exercise Comments [name] is doing great with exercise and making good progress.  Sh eis now up to 15 min on her first two machines.  We will continue to work on her progress and increasing her time on the treadmill. [name] continues to do well with exercise.  Shs is now up to 14 min on the treadmill.  We will continue to work with her on progression.         Discharge Exercise Prescription (  Final Exercise Prescription Changes):     Exercise Prescription Changes - 08/07/15 1000    Exercise Review   Progression Yes   Response to Exercise   Blood Pressure (Admit) 116/68 mmHg   Blood Pressure (Exercise) 130/70 mmHg   Blood Pressure (Exit) 102/60 mmHg   Heart Rate (Admit) 83 bpm   Heart Rate (Exercise) 101 bpm   Heart Rate (Exit) 90 bpm   Oxygen Saturation (Admit) 96 %   Oxygen Saturation (Exercise) 92 %   Oxygen Saturation (Exit) 96 %   Rating of Perceived Exertion (Exercise) 15   Perceived Dyspnea (Exercise) 4   Symptoms None   Duration Progress to 45 minutes of aerobic exercise without signs/symptoms of physical distress   Intensity THRR unchanged   Progression   Progression Continue to progress workloads to maintain intensity without signs/symptoms of physical distress.   Average METs 3.34   Resistance Training   Training Prescription Yes   Weight 3   Reps 10-12   Interval Training   Interval Training No   Treadmill   MPH 2   Grade 1   Minutes 15   METs 2.81   NuStep   Level 3   Watts 42   Minutes 15    METs 3.5   REL-XR   Level 3   Watts 52   Minutes 10   METs 3.9       Nutrition:  Target Goals: Understanding of nutrition guidelines, daily intake of sodium <1544m, cholesterol <2061m calories 30% from fat and 7% or less from saturated fats, daily to have 5 or more servings of fruits and vegetables.  Biometrics:    Nutrition Therapy Plan and Nutrition Goals:     Nutrition Therapy & Goals - 07/23/15 1430    Nutrition Therapy   Diet Instructed patient on steps to take to increase calories to prevent further weight loss and to promote weight gain.   Patient has lost approximately 30 lbs since 12/2014.   Protein (specify units) Minimum goal of 60 gms per day. Stressed importance of adequate protein for immune system and to maintain muscle strength.   Fiber 25 grams   Whole Grain Foods 3 servings   Saturated Fats 12 max. grams   Fruits and Vegetables 5 servings/day   Sodium 2000 grams   Personal Nutrition Goals   Personal Goal #1 Include at least 4 oz. of a protein food daily (refer to list). Supplement protein with 11 oz. of Premier protein shake split into 3 oz. servings 3-4 x/day so as to not overwhelm at one time.   Personal Goal #2 Add butter/margarine, mayonnaise or oil to any appropriate foods as discussed.   Personal Goal #3 Try to include 1 oz equivalent of protein with each meal.    Personal Goal #4 Eat 5-6 small meals/snacks per day when ever possible.   Comments Main goal presently is to prevent further weight loss and to promote weight gain.   Intervention Plan   Intervention Prescribe, educate and counsel regarding individualized specific dietary modifications aiming towards targeted core components such as weight, hypertension, lipid management, diabetes, heart failure and other comorbidities.;Nutrition handout(s) given to patient.   Expected Outcomes Short Term Goal: A plan has been developed with personal nutrition goals set during dietitian appointment.;Long  Term Goal: Adherence to prescribed nutrition plan.      Nutrition Discharge: Rate Your Plate Scores:   Psychosocial: Target Goals: Acknowledge presence or absence of depression, maximize coping skills, provide positive support system. Participant  is able to verbalize types and ability to use techniques and skills needed for reducing stress and depression.  Initial Review & Psychosocial Screening:     Initial Psych Review & Screening - 06/30/15 0915    Family Dynamics   Good Support System? Yes   Comments Ms Biswell has good support from her son and daughter. Her sisters and brothers live close, so Ms Lantz has help with house chores. She does take care of her husband which is very difficult, especially with her shortness of breath.  She is hopeful that pul rehab will help improve her quality of life.                                Barriers   Psychosocial barriers to participate in program The patient should benefit from training in stress management and relaxation.   Screening Interventions   Interventions Program counselor consult;Encouraged to exercise      Quality of Life Scores:     Quality of Life - 06/30/15 0915    Quality of Life Scores   Health/Function Pre 15.75 %   Socioeconomic Pre 30 %   Psych/Spiritual Pre 20.57 %   Family Pre 26.4 %   GLOBAL Pre 21.09 %      PHQ-9:     Recent Review Flowsheet Data    Depression screen [facility] 2/9 06/30/2015   Decreased Interest 0   Down, Depressed, Hopeless 0   PHQ - 2 Score 0   Altered sleeping 2   Tired, decreased energy 3   Change in appetite 3   Feeling bad or failure about yourself  0   Trouble concentrating 1   Moving slowly or fidgety/restless 2   Suicidal thoughts 0   PHQ-9 Score 11   Difficult doing work/chores Very difficult      Psychosocial Evaluation and Intervention:     Psychosocial Evaluation - 07/13/15 1036    Psychosocial Evaluation & Interventions   Interventions Encouraged to exercise  with the program and follow exercise prescription   Comments Counselor met with Ms. McCalister today for initial psychosocial evaluation.  She is a 70 year old who has had lung disease since 1990 and was on antibiotics for almost 20 years.  As a result, she became immune to their benefits and has struggled more since 2016.  She has a strong support system with adult children who live close by and a strong faith community who support her.   Ms. Seher has a spouse as well, but he is disabled and requires a great deal of support himself.  She reports not sleeping well at all and "the Doctors won't give me anything because of my breathing."  She also states her appetite has diminished significantly over the past year and she has lost a great deal of weight.  She denies a history of depression or current symptoms, although she was tearful and states she does "get depressed sometime due to her health."  Ms. Mcglown struggles with her mood and admits her health is her primary stressor.  She has goals to gain weight and get stronger so she can enjoy life!  Counselor will follow with Ms. Fellenz on these goals and on her mood issues.  Counselor also recommended speaking with her doctor or pharmacist about a natural OTC sleep aid to help with this.  Also Ms. Gick has an appointment to meet with the dietician for weight gain tips  next week.     Continued Psychosocial Services Needed Yes  Counselor will follow with Ms. Ozment re: her mood while in this program to see if it improves as her health improves.  If not, counselor mentioned recommending she see a therapist for ongoing counseling services, and  speak to her Dr. Donivan Scull: a RX eval.      Psychosocial Re-Evaluation:     Psychosocial Re-Evaluation      07/29/15 1124           Psychosocial Re-Evaluation   Comments Counselor follow up with Ms. Paez today reporting she was put on an anti-depressant/anti-anxiety medication last week by her PA  and she had a negative reaction almost immediately with heart palpitations and increased tearfulness as well as ongoing sleep issues.  She took for 3 days and decided to d/c even though she spoke with her pharmacist about cutting the dose in half (which she did) she felt it was still too much for her.  She has a follow up with her PCP about this in July and prefers not to take anything to treat these symptoms prior to this.  Counselor commended Ms. Lafortune for being so self-aware of her body and symptoms to take good care of herself.  Counselor encouraged her to inform her Dr. of this problem sooner so a possible change of medication could be attempted, but she states her body is sensitive to medication changes and would prefer to wait.  Counselor will continue to follow with Ms. Krawiec as needed.          Education: Education Goals: Education classes will be provided on a weekly basis, covering required topics. Participant will state understanding/return demonstration of topics presented.  Learning Barriers/Preferences:     Learning Barriers/Preferences - 06/30/15 0915    Learning Barriers/Preferences   Learning Barriers None   Learning Preferences Group Instruction;Individual Instruction;Pictoral;Skilled Demonstration;Verbal Instruction;Video;Written Material      Education Topics: Initial Evaluation Education: - Verbal, written and demonstration of respiratory meds, RPE/PD scales, oximetry and breathing techniques. Instruction on use of nebulizers and MDIs: cleaning and proper use, rinsing mouth with steroid doses and importance of monitoring MDI activations.          Pulmonary Rehab from 07/27/2015 in [facility] Cardiac and Pulmonary Rehab   Date  06/30/15   Educator  LB   Instruction Review Code  2- meets goals/outcomes      General Nutrition Guidelines/Fats and Fiber: -Group instruction provided by verbal, written material, models and posters to present the general guidelines for  heart healthy nutrition. Gives an explanation and review of dietary fats and fiber.   Controlling Sodium/Reading Food Labels: -Group verbal and written material supporting the discussion of sodium use in heart healthy nutrition. Review and explanation with models, verbal and written materials for utilization of the food label.      Pulmonary Rehab from 07/27/2015 in [facility] Cardiac and Pulmonary Rehab   Date  07/20/15   Educator  CR   Instruction Review Code  2- meets goals/outcomes      Exercise Physiology & Risk Factors: - Group verbal and written instruction with models to review the exercise physiology of the cardiovascular system and associated critical values. Details cardiovascular disease risk factors and the goals associated with each risk factor.   Aerobic Exercise & Resistance Training: - Gives group verbal and written discussion on the health impact of inactivity. On the components of aerobic and resistive training programs and the benefits of this training and  how to safely progress through these programs.   Flexibility, Balance, General Exercise Guidelines: - Provides group verbal and written instruction on the benefits of flexibility and balance training programs. Provides general exercise guidelines with specific guidelines to those with heart or lung disease. Demonstration and skill practice provided.   Stress Management: - Provides group verbal and written instruction about the health risks of elevated stress, cause of high stress, and healthy ways to reduce stress.   Depression: - Provides group verbal and written instruction on the correlation between heart/lung disease and depressed mood, treatment options, and the stigmas associated with seeking treatment.      Pulmonary Rehab from 07/27/2015 in [facility] Cardiac and Pulmonary Rehab   Date  07/15/15   Educator  [place]   Instruction Review Code  2- meets goals/outcomes      Exercise & Equipment Safety: - Individual verbal  instruction and demonstration of equipment use and safety with use of the equipment.   Infection Prevention: - Provides verbal and written material to individual with discussion of infection control including proper hand washing and proper equipment cleaning during exercise session.      Pulmonary Rehab from 07/27/2015 in [facility] Cardiac and Pulmonary Rehab   Date  06/30/15   Educator  LB   Instruction Review Code  2- meets goals/outcomes      Falls Prevention: - Provides verbal and written material to individual with discussion of falls prevention and safety.      Pulmonary Rehab from 07/27/2015 in [facility] Cardiac and Pulmonary Rehab   Date  06/30/15   Educator  LB   Instruction Review Code  2- meets goals/outcomes      Diabetes: - Individual verbal and written instruction to review signs/symptoms of diabetes, desired ranges of glucose level fasting, after meals and with exercise. Advice that pre and post exercise glucose checks will be done for 3 sessions at entry of program.   Chronic Lung Diseases: - Group verbal and written instruction to review new updates, new respiratory medications, new advancements in procedures and treatments. Provide informative websites and "800" numbers of self-education.   Lung Procedures: - Group verbal and written instruction to describe testing methods done to diagnose lung disease. Review the outcome of test results. Describe the treatment choices: Pulmonary Function Tests, ABGs and oximetry.   Energy Conservation: - Provide group verbal and written instruction for methods to conserve energy, plan and organize activities. Instruct on pacing techniques, use of adaptive equipment and posture/positioning to relieve shortness of breath.   Triggers: - Group verbal and written instruction to review types of environmental controls: home humidity, furnaces, filters, dust mite/pet prevention, HEPA vacuums. To discuss weather changes, air quality and the  benefits of nasal washing.   Exacerbations: - Group verbal and written instruction to provide: warning signs, infection symptoms, calling MD promptly, preventive modes, and value of vaccinations. Review: effective airway clearance, coughing and/or vibration techniques. Create an Sports administrator.      Pulmonary Rehab from 07/27/2015 in [facility] Cardiac and Pulmonary Rehab   Date  07/27/15   Educator  LB   Instruction Review Code  2- meets goals/outcomes      Oxygen: - Individual and group verbal and written instruction on oxygen therapy. Includes supplement oxygen, available portable oxygen systems, continuous and intermittent flow rates, oxygen safety, concentrators, and Medicare reimbursement for oxygen.   Respiratory Medications: - Group verbal and written instruction to review medications for lung disease. Drug class, frequency, complications, importance of spacers, rinsing mouth  after steroid MDI's, and proper cleaning methods for nebulizers.      Pulmonary Rehab from 07/27/2015 in [facility] Cardiac and Pulmonary Rehab   Date  06/30/15   Educator  LB   Instruction Review Code  2- meets goals/outcomes      AED/CPR: - Group verbal and written instruction with the use of models to demonstrate the basic use of the AED with the basic ABC's of resuscitation.   Breathing Retraining: - Provides individuals verbal and written instruction on purpose, frequency, and proper technique of diaphragmatic breathing and pursed-lipped breathing. Applies individual practice skills.      Pulmonary Rehab from 07/27/2015 in [facility] Cardiac and Pulmonary Rehab   Date  06/30/15   Educator  LB   Instruction Review Code  2- meets goals/outcomes      Anatomy and Physiology of the Lungs: - Group verbal and written instruction with the use of models to provide basic lung anatomy and physiology related to function, structure and complications of lung disease.      Pulmonary Rehab from 07/27/2015 in [facility] Cardiac and  Pulmonary Rehab   Date  07/17/15   Educator  [place]   Instruction Review Code  2- meets goals/outcomes      Heart Failure: - Group verbal and written instruction on the basics of heart failure: signs/symptoms, treatments, explanation of ejection fraction, enlarged heart and cardiomyopathy.   Sleep Apnea: - Individual verbal and written instruction to review Obstructive Sleep Apnea. Review of risk factors, methods for diagnosing and types of masks and machines for OSA.   Anxiety: - Provides group, verbal and written instruction on the correlation between heart/lung disease and anxiety, treatment options, and management of anxiety.   Relaxation: - Provides group, verbal and written instruction about the benefits of relaxation for patients with heart/lung disease. Also provides patients with examples of relaxation techniques.   Knowledge Questionnaire Score:     Knowledge Questionnaire Score - 06/30/15 0915    Knowledge Questionnaire Score   Pre Score 9/10       Core Components/Risk Factors/Patient Goals at Admission:     Personal Goals and Risk Factors at Admission - 06/30/15 1157    Core Components/Risk Factors/Patient Goals on Admission    Weight Management --  Ms Chismar prefers not to meet with the dietitian. She has lost 20lbs due to her bronchiectasis. Supplements, such as boost, Ensure make her have diarrhea. She cooks healthy and does eat out occasionally.      Core Components/Risk Factors/Patient Goals Review:      Goals and Risk Factor Review      07/06/15 1000 07/08/15 1251 07/17/15 1234 07/20/15 1608 07/24/15 1235   Core Components/Risk Factors/Patient Goals Review   Personal Goals Review Develop more efficient breathing techniques such as purse lipped breathing and diaphragmatic breathing and practicing self-pacing with activity.  Increase knowledge of respiratory medications and ability to use respiratory devices properly. Increase knowledge of respiratory  medications and ability to use respiratory devices properly. Develop more efficient breathing techniques such as purse lipped breathing and diaphragmatic breathing and practicing self-pacing with activity.   Review Reviewed PLB technique; good demonstration of PLB and used it with her exercise goals Ms Alois Cliche arrived very shor of breath. We discussed pacing herself and sing PLB walking to class. After she rested, she did very good with her exercise goals. She is now 108.9lbx and has a very poor appetitie. She would now like to meet with the dietitian  for options to help her  gain weight; even drinks like Boost upset her stomach. Ms. Rena was given a flutter valve today to use for her secreations.  She demonstrated proper technique when using the flutter valve.  Followed up on Ms Edberg and the flutter valve. She is using it, but has no had trouble bringing up her secretions. Using her SVN when she is wheezy. Keeps it clean with soapy water and once a week with vinigar. I reviewed PLB with Ms. Yohn.  She demonstrates proper technique and knows when to use it.   Expected Outcomes Continue using PLB with exercise goals and activity at home.  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 Continue using Flutter Valve and SVN to manage her secretions and wheezing episodes. PLB will help her with SOB while exercising and with ADLs.  This will help her to be able to do more in both area of her life.      07/24/15 1236 08/03/15 1618         Core Components/Risk Factors/Patient Goals Review   Personal Goals Review Improve shortness of breath with ADL's Sedentary;Increase knowledge of respiratory medications and ability to use respiratory devices properly.      Review Talked with Ms. MsAlister about a chest vest.  She has an extreme about of mucous that is causing continious coughing spells.  I told her to speak with her physician about  aquiring  one if possible. Ms Dileo uses her SVN when she feels tight chested and wheezing. She has a good understanding of albuterol and cleaning her nebulizer. She also walked on her treadmill at home for 5 minutes and plans to increase her time on it on off days from  [place].      Expected Outcomes Hopefully the chest vest will help manage her secreations and help improve her SOB and ADLs.  Continue exercising on home TM - increasing her time gradually.         Core Components/Risk Factors/Patient Goals at Discharge (Final Review):      Goals and Risk Factor Review - 08/03/15 1618    Core Components/Risk Factors/Patient Goals Review   Personal Goals Review Sedentary;Increase knowledge of respiratory medications and ability to use respiratory devices properly.   Review Ms Littrell uses her SVN when she feels tight chested and wheezing. She has a good understanding of albuterol and cleaning her nebulizer. She also walked on her treadmill at home for 5 minutes and plans to increase her time on it on off days from  [place].   Expected Outcomes Continue exercising on home TM - increasing her time gradually.      ITP Comments:     ITP Comments      07/10/15 1121 07/15/15 1128 08/07/15 1049 08/10/15 1118     ITP Comments Today [name] complained of feeling dizzy after she moved from the NuStep to the treadmill. She sat down and rested for a few minutes.  BP check revealed 88/60. Skin warm and dry.   Given 2 full cups of water before she felt better.  BP reading after the water 124/76. She resumed her exercise and no more concerns occurred.  Talked to her about staying hydrated before, during andafter class.  She voiced understanding.  [name] was concerned about her BP being low and has had times at home she felt dizzy.  I recommended she call her Dr. to discuss symptoms and meds. Letti has a chronic cough that her MD  is treating but continues to do well exercising in [facility].  Educated on A-fib by  Carson Myrtle (respiratory therapist). Discussed what to do when it occurs (rest) and medication managment. Will be bringing the name of her A-fib med next session to update our med list.        Comments: 30 Day Review

## 2015-08-12 ENCOUNTER — Encounter: Payer: Medicare Other | Admitting: *Deleted

## 2015-08-12 DIAGNOSIS — J479 Bronchiectasis, uncomplicated: Secondary | ICD-10-CM | POA: Diagnosis not present

## 2015-08-12 NOTE — Progress Notes (Signed)
Daily Session Note  Patient Details  Name: Ana Washington MRN: [id number] Date of Birth: 1945/11/11 Referring Provider:        Pulmonary Rehab from 07/08/2015 in [facility] Cardiac and Pulmonary Rehab   Referring Provider  [name]      Encounter Date: 08/12/2015  Check In:     Session Check In - 08/12/15 1145    Check-In   Location [facility]-Cardiac & Pulmonary Rehab   Staff Present Alberteen Sam, MA, ACSM RCEP, Exercise Physiologist;Amanda Oletta Darter, BA, ACSM CEP, Exercise Physiologist;Laureen Janell Quiet, RRT, Respiratory Therapist   Supervising physician immediately available to respond to emergencies [facility] immediately available ER MD   Physician(s) Edd Fabian and Jimmye Norman   Medication changes reported     No   Fall or balance concerns reported    No   Warm-up and Cool-down Performed on first and last piece of equipment   Resistance Training Performed Yes   VAD Patient? No   Pain Assessment   Currently in Pain? No/denies   Multiple Pain Sites No         Goals Met:  Proper associated with RPD/PD & O2 Sat Independence with exercise equipment Using PLB without cueing & demonstrates good technique Exercise tolerated well Strength training completed today  Goals Unmet:  Not Applicable  Comments: Pt able to follow exercise prescription today without complaint.  Will continue to monitor for progression.    Dr. Emily Filbert is Medical Director for [place] and [facility].

## 2015-08-14 ENCOUNTER — Encounter: Payer: Medicare Other | Admitting: Respiratory Therapy

## 2015-08-14 DIAGNOSIS — J479 Bronchiectasis, uncomplicated: Secondary | ICD-10-CM

## 2015-08-14 NOTE — Progress Notes (Signed)
Daily Session Note  Patient Details  Name: Ana Washington MRN: [id number] Date of Birth: [birth year] Referring Provider:        Pulmonary Rehab from 07/08/2015 in [facility] Cardiac and Pulmonary Rehab   Referring Provider  Ana Washington      Encounter Date: 08/14/2015  Check In:     Session Check In - 08/14/15 1325    Check-In   Location [facility]-Cardiac & Pulmonary Rehab   Staff Present Nyoka Cowden, RN, BSN, Robley Fries, RN, BSN;Conner Muegge Blanch Media, RRT, RCP, Respiratory Therapist   Supervising physician immediately available to respond to emergencies [facility] immediately available ER MD   Physician(s) Dr. Kerman Passey and Jimmye Norman   Medication changes reported     No   Resistance Training Performed Yes   VAD Patient? No   Pain Assessment   Currently in Pain? No/denies         Goals Met:  Proper associated with RPD/PD & O2 Sat Independence with exercise equipment Exercise tolerated well Strength training completed today  Goals Unmet:  Not Applicable  Comments: Pt able to follow exercise prescription today without complaint.  Will continue to monitor for progression.   Dr. Emily Filbert is Medical Director for [place] and [facility].

## 2015-08-17 ENCOUNTER — Encounter: Payer: Medicare Other | Admitting: *Deleted

## 2015-08-17 DIAGNOSIS — J479 Bronchiectasis, uncomplicated: Secondary | ICD-10-CM

## 2015-08-17 NOTE — Progress Notes (Signed)
Daily Session Note  Patient Details  Name: Ana Washington MRN: [id number] Date of Birth: 1945-08-09 Referring Provider:        Pulmonary Rehab from 07/08/2015 in [facility] Cardiac and Pulmonary Rehab   Referring Provider  [name]      Encounter Date: 08/17/2015  Check In:     Session Check In - 08/17/15 1053    Check-In   Location [facility]-Cardiac & Pulmonary Rehab   Staff Present Alberteen Sam, MA, ACSM RCEP, Exercise Physiologist;Laureen Owens Shark, BS, RRT, Respiratory Bertis Ruddy, BS, ACSM CEP, Exercise Physiologist   Supervising physician immediately available to respond to emergencies [facility] immediately available ER MD   Physician(s) Joni Fears and Corky Downs   Medication changes reported     No   Fall or balance concerns reported    No   Warm-up and Cool-down Performed on first and last piece of equipment   Resistance Training Performed Yes   VAD Patient? No   Pain Assessment   Currently in Pain? No/denies   Multiple Pain Sites No         Goals Met:  Proper associated with RPD/PD & O2 Sat Independence with exercise equipment Exercise tolerated well Personal goals reviewed Strength training completed today  Goals Unmet:  Not Applicable  Comments: 6 min walk evaluation was done today. Results were reviewed with patient.       6 Minute Walk      06/30/15 1128 08/17/15 1042     6 Minute Walk   Phase  Mid Program    Distance 912 feet 1025 feet    Distance % Change  12 %    Walk Time 4.73 minutes 6 minutes    # of Rest Breaks 1  rest 1:16  1  Rested due to SOB    MPH 2.19 1.94    METS 1.5 2.49    RPE 15 13    Perceived Dyspnea  7 4    VO2 Peak  8.71    Symptoms Yes (comment) Yes (comment)    Comments shortness of breath Shortness of breath-rested then resumed walk    Resting HR 80 bpm 79 bpm    Resting BP 120/80 mmHg 118/60 mmHg    Max Ex. HR 114 bpm 105 bpm    Max Ex. BP 158/90 mmHg 132/82 mmHg    2 Minute Post BP 126/78 mmHg 122/70 mmHg    Interval HR   Baseline HR 80 79    1 Minute HR 98 98    2 Minute HR 104 105    3 Minute HR 111 98    4 Minute HR  92    5 Minute HR 114 98    6 Minute HR 114 98    2 Minute Post HR 88 85    Interval Heart Rate? Yes Yes    Interval Oxygen   Interval Oxygen? Yes Yes    Baseline Oxygen Saturation % 96 % 97 %    Baseline Liters of Oxygen  0 L    1 Minute Oxygen Saturation % 97 % 94 %    1 Minute Liters of Oxygen  0 L    2 Minute Oxygen Saturation % 93 % 96 %    2 Minute Liters of Oxygen  0 L    3 Minute Oxygen Saturation % 93 % 93 %    3 Minute Liters of Oxygen  0 L    4 Minute Oxygen Saturation %  97 %  4 Minute Liters of Oxygen  0 L    5 Minute Oxygen Saturation % 91 % 95 %    5 Minute Liters of Oxygen  0 L    6 Minute Oxygen Saturation % 93 % 93 %    6 Minute Liters of Oxygen  0 L    2 Minute Post Oxygen Saturation % 98 % 98 %    2 Minute Post Liters of Oxygen  0 L          Dr. Emily Filbert is Medical Director for [place] and [facility].

## 2015-08-19 NOTE — Progress Notes (Signed)
Daily Session Note  Patient Details  Name: Ana Washington MRN: [id number] Date of Birth: February 27, 1945 Referring Provider:        Pulmonary Rehab from 07/08/2015 in [facility] Cardiac and Pulmonary Rehab   Referring Provider  [name]      Encounter Date: 08/19/2015  Check In:     Session Check In - 08/19/15 1123    Check-In   Location [facility]-Cardiac & Pulmonary Rehab   Staff Present Heath Lark, RN, BSN, CCRP;Laureen Owens Shark, BS, RRT, Respiratory Dareen Piano, BA, ACSM CEP, Exercise Physiologist   Supervising physician immediately available to respond to emergencies [facility] immediately available ER MD   Physician(s) Cinda Quest and Jimmye Norman   Medication changes reported     No   Fall or balance concerns reported    No   Warm-up and Cool-down Performed on first and last piece of equipment   Resistance Training Performed No   VAD Patient? No   Pain Assessment   Currently in Pain? No/denies   Multiple Pain Sites No         Goals Met:  Proper associated with RPD/PD & O2 Sat Independence with exercise equipment Exercise tolerated well  Goals Unmet:  Not Applicable  Comments: Pt able to follow exercise prescription today without complaint.  Will continue to monitor for progression.    Dr. Emily Filbert is Medical Director for [place] and [facility].

## 2015-08-24 ENCOUNTER — Encounter: Payer: Medicare Other | Admitting: *Deleted

## 2015-08-24 DIAGNOSIS — J479 Bronchiectasis, uncomplicated: Secondary | ICD-10-CM | POA: Diagnosis not present

## 2015-08-24 NOTE — Progress Notes (Signed)
Daily Session Note  Patient Details  Name: WAYNETTE TOWERS MRN: [id number] Date of Birth: 02-Mar-1945 Referring Provider:   [name] Pulmonary Rehab from 07/08/2015 in [facility] Cardiac and Pulmonary Rehab  Referring Provider  [name]      Encounter Date: 08/24/2015  Check In:     Session Check In - 08/24/15 1145      Check-In   Location [facility]-Cardiac & Pulmonary Rehab   Staff Present Alberteen Sam, MA, ACSM RCEP, Exercise Physiologist;Kelly Amedeo Plenty, BS, ACSM CEP, Exercise Physiologist;Laureen Owens Shark, BS, RRT, Respiratory Therapist   Supervising physician immediately available to respond to emergencies [facility] immediately available ER MD   Physician(s) Drs. Archie Balboa and [place]   Medication changes reported     No   Fall or balance concerns reported    No   Warm-up and Cool-down Performed on first and last piece of equipment   Resistance Training Performed Yes   VAD Patient? No     Pain Assessment   Currently in Pain? No/denies   Multiple Pain Sites No         Goals Met:  Proper associated with RPD/PD & O2 Sat Independence with exercise equipment Improved SOB with ADL's Using PLB without cueing & demonstrates good technique Exercise tolerated well Personal goals reviewed Strength training completed today  Goals Unmet:  Not Applicable  Comments: Pt able to follow exercise prescription today without complaint.  Will continue to monitor for progression. Reviewed home exercise with pt today.  Pt plans to treadmill at home for exercise.  Reviewed THR, pulse, RPE, sign and symptoms, pulse oximetry, and when to call 911 or MD.  Also discussed weather considerations and indoor options.  Pt voiced understanding. Alberteen Sam, MA, ACSM RCEP 08/24/2015 11:49 AM    Dr. Emily Filbert is Medical Director for [place] and [facility].

## 2015-08-26 ENCOUNTER — Encounter: Payer: Medicare Other | Admitting: *Deleted

## 2015-08-26 DIAGNOSIS — J479 Bronchiectasis, uncomplicated: Secondary | ICD-10-CM

## 2015-08-26 NOTE — Progress Notes (Signed)
Daily Session Note  Patient Details  Name: Ana Washington MRN: [id number] Date of Birth: 1945/03/20 Referring Provider:   [name] Pulmonary Rehab from 07/08/2015 in [facility] Cardiac and Pulmonary Rehab  Referring Provider  [name]      Encounter Date: 08/26/2015  Check In:      Goals Met:  Independence with exercise equipment Exercise tolerated well Strength training completed today  Goals Unmet:  Not Applicable  Comments: Doing well with exercise prescription progression.    Dr. Emily Filbert is Medical Director for [place] and [facility].

## 2015-08-28 ENCOUNTER — Encounter: Payer: Medicare Other | Admitting: *Deleted

## 2015-08-28 DIAGNOSIS — J479 Bronchiectasis, uncomplicated: Secondary | ICD-10-CM

## 2015-08-28 NOTE — Progress Notes (Signed)
Daily Session Note  Patient Details  Name: Ana Washington MRN: [id number] Date of Birth: 24-Aug-1945 Referring Provider:   [name] Pulmonary Rehab from 07/08/2015 in [facility] Cardiac and Pulmonary Rehab  Referring Provider  [name]      Encounter Date: 08/28/2015  Check In:     Session Check In - 08/28/15 1122      Check-In   Location [facility]-Cardiac & Pulmonary Rehab   Staff Present Gerlene Burdock, RN, BSN;Stacey Blanch Media, RRT, RCP, Respiratory Therapist;Mary Kellie Shropshire, RN, BSN, MA   Supervising physician immediately available to respond to emergencies [facility] immediately available ER MD   Physician(s) DR. Edd Fabian and Dr. Clearnce Hasten   Medication changes reported     No   Fall or balance concerns reported    No   Warm-up and Cool-down Performed on first and last piece of equipment   Resistance Training Performed Yes   VAD Patient? No     Pain Assessment   Currently in Pain? No/denies         Goals Met:  Proper associated with RPD/PD & O2 Sat Exercise tolerated well  Goals Unmet:  Not Applicable  Comments:    Dr. Emily Filbert is Medical Director for [place] and [facility].

## 2015-09-02 ENCOUNTER — Encounter: Payer: Medicare Other | Attending: Family Medicine

## 2015-09-02 DIAGNOSIS — J479 Bronchiectasis, uncomplicated: Secondary | ICD-10-CM | POA: Insufficient documentation

## 2015-09-02 NOTE — Progress Notes (Signed)
Daily Session Note  Patient Details  Name: Ana Washington MRN: [id number] Date of Birth: Jun 23, 1945 Referring Provider:   [name] Pulmonary Rehab from 07/08/2015 in [facility] Cardiac and Pulmonary Rehab  Referring Provider  [name]      Encounter Date: 09/02/2015  Check In:     Session Check In - 09/02/15 1217      Check-In   Location [facility]-Cardiac & Pulmonary Rehab   Staff Present Heath Lark, RN, BSN, CCRP;Laureen Owens Shark, BS, RRT, Respiratory Dareen Piano, BA, ACSM CEP, Exercise Physiologist   Supervising physician immediately available to respond to emergencies [facility] immediately available ER MD   Physician(s) Quentin Cornwall and Marcelene Butte   Medication changes reported     No   Fall or balance concerns reported    No   Warm-up and Cool-down Performed on first and last piece of equipment   Resistance Training Performed Yes   VAD Patient? No     VAD patient   Has back up controller? No     Pain Assessment   Currently in Pain? No/denies   Multiple Pain Sites No           Exercise Prescription Changes - 09/01/15 1500      Exercise Review   Progression Yes     Response to Exercise   Blood Pressure (Admit) 102/60   Blood Pressure (Exercise) 134/78   Blood Pressure (Exit) 102/62   Heart Rate (Admit) 182 bpm   Heart Rate (Exercise) 90 bpm   Heart Rate (Exit) 88 bpm   Oxygen Saturation (Admit) 96 %   Oxygen Saturation (Exercise) 94 %   Oxygen Saturation (Exit) 96 %   Rating of Perceived Exertion (Exercise) 13   Perceived Dyspnea (Exercise) 4   Symptoms None   Duration Progress to 45 minutes of aerobic exercise without signs/symptoms of physical distress   Intensity THRR unchanged     Progression   Progression Continue to progress workloads to maintain intensity without signs/symptoms of physical distress.   Average METs 3.73     Resistance Training   Training Prescription Yes   Weight 3   Reps 10-12     Interval Training   Interval Training No      Treadmill   MPH 2.1   Grade 1   Minutes 15   METs 2.81     NuStep   Level 4   Minutes 15   METs 3.7     REL-XR   Level 3   Minutes 15   METs 4.6     Home Exercise Plan   Plans to continue exercise at Home  treadmill   Frequency Add 1 additional day to program exercise sessions.      Goals Met:  Proper associated with RPD/PD & O2 Sat Independence with exercise equipment Exercise tolerated well Strength training completed today  Goals Unmet:  Not Applicable  Comments: Pt able to follow exercise prescription today without complaint.  Will continue to monitor for progression.    Dr. Emily Filbert is Medical Director for [place] and [facility].

## 2015-09-04 ENCOUNTER — Encounter: Payer: Medicare Other | Admitting: *Deleted

## 2015-09-04 DIAGNOSIS — J479 Bronchiectasis, uncomplicated: Secondary | ICD-10-CM

## 2015-09-04 NOTE — Progress Notes (Signed)
Daily Session Note  Patient Details  Name: MAIRANY BRUNO MRN: [id number] Date of Birth: [birth year] Referring Provider:   [name] Pulmonary Rehab from 07/08/2015 in [facility] Cardiac and Pulmonary Rehab  Referring Provider  [name]      Encounter Date: 09/04/2015  Check In:     Session Check In - 09/04/15 1210      Check-In   Staff Present Heath Lark, RN, BSN, CCRP;Laureen Owens Shark, BS, RRT, Respiratory Therapist;Carroll Enterkin, RN, BSN   Supervising physician immediately available to respond to emergencies [facility] immediately available ER MD   Physician(s) Drs: Kerman Passey and Darl Householder   Medication changes reported     No   Fall or balance concerns reported    No   Resistance Training Performed Yes   VAD Patient? No     VAD patient   Has back up controller? No     Pain Assessment   Currently in Pain? No/denies         Goals Met:  Independence with exercise equipment Exercise tolerated well Strength training completed today  Goals Unmet:  Not Applicable  Comments: Doing well with exercise prescription progression.    Dr. Emily Filbert is Medical Director for [place] and [facility].

## 2015-09-07 ENCOUNTER — Encounter: Payer: Medicare Other | Admitting: *Deleted

## 2015-09-07 ENCOUNTER — Encounter: Payer: Self-pay | Admitting: *Deleted

## 2015-09-07 DIAGNOSIS — J479 Bronchiectasis, uncomplicated: Secondary | ICD-10-CM

## 2015-09-07 NOTE — Progress Notes (Signed)
Daily Session Note  Patient Details  Name: Ana Washington MRN: [id number] Date of Birth: 23-Jul-1945 Referring Provider:   [name] Pulmonary Rehab from 07/08/2015 in [facility] Cardiac and Pulmonary Rehab  Referring Provider  [name]      Encounter Date: 09/07/2015  Check In:     Session Check In - 09/07/15 1244      Check-In   Location [facility]-Cardiac & Pulmonary Rehab   Staff Present Alberteen Sam, MA, ACSM RCEP, Exercise Physiologist;Kelly Amedeo Plenty, BS, ACSM CEP, Exercise Physiologist;Laureen Owens Shark, BS, RRT, Respiratory Therapist   Supervising physician immediately available to respond to emergencies [facility] immediately available ER MD   Physician(s) Burlene Arnt and [place]   Medication changes reported     No   Fall or balance concerns reported    No   Warm-up and Cool-down Performed on first and last piece of equipment   Resistance Training Performed Yes   VAD Patient? No     VAD patient   Has back up controller? No     Pain Assessment   Currently in Pain? No/denies   Multiple Pain Sites No         Goals Met:  Proper associated with RPD/PD & O2 Sat Independence with exercise equipment Using PLB without cueing & demonstrates good technique Exercise tolerated well Strength training completed today  Goals Unmet:  Not Applicable  Comments: Pt able to follow exercise prescription today without complaint.  Will continue to monitor for progression.    Dr. Emily Filbert is Medical Director for [place] and [facility].

## 2015-09-07 NOTE — Progress Notes (Signed)
Pulmonary Individual Treatment Plan  Patient Details  Name: Ana Washington MRN: [id number] Date of Birth: [birth year] Referring Provider:   [name] Pulmonary Rehab from 07/08/2015 in [facility] Cardiac and Pulmonary Rehab  Referring Provider  [name]      Initial Encounter Date:  Flowsheet Row Pulmonary Rehab from 07/08/2015 in [facility] Cardiac and Pulmonary Rehab  Referring Provider  [name]      Visit Diagnosis: Bronchiectasis without complication ([place])  Patient's Home Medications on Admission:  Current Outpatient Prescriptions:  .  alendronate (FOSAMAX) 70 MG tablet, take 1 tablet by mouth every week, Disp: , Rfl:  .  amLODipine (NORVASC) 5 MG tablet, take 1 tablet by mouth once daily, Disp: , Rfl:  .  azithromycin (ZITHROMAX) 250 MG tablet, Take by mouth., Disp: , Rfl:  .  Calcium Carbonate-Vitamin D (OYSTER SHELL/VITAMIN D) 600-125 MG-UNIT TABS, Take by mouth., Disp: , Rfl:  .  Cholecalciferol (VITAMIN D-1000 MAX ST) 1000 UNITS tablet, Take by mouth., Disp: , Rfl:  .  ciprofloxacin (CIPRO) 500 MG tablet, Take by mouth., Disp: , Rfl:  .  diclofenac sodium (VOLTAREN) 1 % GEL, Apply 2 g topically 4 (four) times daily., Disp: 100 g, Rfl: 1 .  fluocinonide (LIDEX) 0.05 % external solution, prn as needed, Disp: , Rfl:  .  fluticasone (FLONASE) 50 MCG/ACT nasal spray, Place into the nose., Disp: , Rfl:  .  hydrochlorothiazide (MICROZIDE) 12.5 MG capsule, take 1 capsule by mouth once daily, Disp: , Rfl:  .  ketoconazole (NIZORAL) 2 % shampoo, Apply topically., Disp: , Rfl:  .  losartan (COZAAR) 100 MG tablet, take 1 tablet by mouth once daily, Disp: , Rfl:  .  metoprolol succinate (TOPROL-XL) 50 MG 24 hr tablet, Take by mouth., Disp: , Rfl:  .  omeprazole (PRILOSEC OTC) 20 MG tablet, Take by mouth., Disp: , Rfl:  .  rivaroxaban (XARELTO) 20 MG TABS tablet, Take by mouth., Disp: , Rfl:  .  triamcinolone cream (KENALOG) 0.1 %, prn as needed, Disp: , Rfl:   Past Medical  History: Past Medical History:  Diagnosis Date  . 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 Hypertension   . Lung disease     Tobacco Use: History  Smoking Status  . Never Smoker  Smokeless Tobacco  . Not on file    Labs: Recent Review Flowsheet Data    There is no flowsheet data to display.       ADL UCSD:     Pulmonary Assessment Scores    Row Name 06/30/15 1032 08/17/15 1521       ADL UCSD   ADL Phase  - Mid    SOB Score total 77 81    Rest 1 2    Walk 3 3    Stairs 4 4    Bath 4 3    Dress 3 3    Shop 4 5       Pulmonary Function Assessment:     Pulmonary Function Assessment - 06/30/15 0915      Initial Spirometry Results   FVC% 37 %   FEV1% 42 %   FEV1/FVC Ratio 86.96     Post Bronchodilator Spirometry Results   FVC% 49 %   FEV1% 46 %   FEV1/FVC Ratio 80.79     Breath   Bilateral Breath Sounds Clear;Decreased   Shortness of Breath Yes;Fear of Shortness of Breath;Limiting activity      Exercise Target Goals:    Exercise Program Goal: Individual  exercise prescription set with THRR, safety & activity barriers. Participant demonstrates ability to understand and report RPE using BORG scale, to self-measure pulse accurately, and to acknowledge the importance of the exercise prescription.  Exercise Prescription Goal: Starting with aerobic activity 30 plus minutes a day, 3 days per week for initial exercise prescription. Provide home exercise prescription and guidelines that participant acknowledges understanding prior to discharge.  Activity Barriers & Risk Stratification:     Activity Barriers & Cardiac Risk Stratification - 06/30/15 0915      Activity Barriers & Cardiac Risk Stratification   Activity Barriers Shortness of Breath;Deconditioning;Muscular Weakness;Other (comment)   Comments Chronic cough   Cardiac Risk Stratification High      6 Minute Walk:     6 Minute Walk    Row Name 06/30/15 1128 08/17/15 1042       6  Minute Walk   Phase  - Mid Program    Distance 912 feet 1025 feet    Distance % Change  - 12 %    Walk Time 4.73 minutes 6 minutes    # of Rest Breaks 1  rest 1:16  1  Rested due to SOB    MPH 2.19 1.94    METS 1.5 2.49    RPE 15 13    Perceived Dyspnea  7 4    VO2 Peak  - 8.71    Symptoms Yes (comment) Yes (comment)    Comments shortness of breath Shortness of breath-rested then resumed walk    Resting HR 80 bpm 79 bpm    Resting BP 120/80 118/60    Max Ex. HR 114 bpm 105 bpm    Max Ex. BP 158/90 132/82    2 Minute Post BP 126/78 122/70      Interval HR   Baseline HR 80 79    1 Minute HR 98 98    2 Minute HR 104 105    3 Minute HR 111 98    4 Minute HR  - 92    5 Minute HR 114 98    6 Minute HR 114 98    2 Minute Post HR 88 85    Interval Heart Rate? Yes Yes      Interval Oxygen   Interval Oxygen? Yes Yes    Baseline Oxygen Saturation % 96 % 97 %    Baseline Liters of Oxygen  - 0 L    1 Minute Oxygen Saturation % 97 % 94 %    1 Minute Liters of Oxygen  - 0 L    2 Minute Oxygen Saturation % 93 % 96 %    2 Minute Liters of Oxygen  - 0 L    3 Minute Oxygen Saturation % 93 % 93 %    3 Minute Liters of Oxygen  - 0 L    4 Minute Oxygen Saturation %  - 97 %    4 Minute Liters of Oxygen  - 0 L    5 Minute Oxygen Saturation % 91 % 95 %    5 Minute Liters of Oxygen  - 0 L    6 Minute Oxygen Saturation % 93 % 93 %    6 Minute Liters of Oxygen  - 0 L    2 Minute Post Oxygen Saturation % 98 % 98 %    2 Minute Post Liters of Oxygen  - 0 L       Initial Exercise Prescription:     Initial Exercise  Prescription - 07/08/15 1400      Date of Initial Exercise RX and Referring Provider   Referring Provider [name]      Perform Capillary Blood Glucose checks as needed.  Exercise Prescription Changes:     Exercise Prescription Changes    Row Name 07/06/15 1500 07/17/15 1200 07/21/15 1300 08/05/15 1300 08/07/15 1000     Exercise Review   Progression  -  - Yes  Yes Yes     Response to Exercise   Blood Pressure (Admit) 124/66  - 110/70 116/68 116/68   Blood Pressure (Exercise) 118/60  - 130/68 130/70 130/70   Blood Pressure (Exit) 102/80  - 100/66 102/60 102/60   Heart Rate (Admit) 91 bpm  - 103 bpm 83 bpm 83 bpm   Heart Rate (Exercise) 87 bpm  - 98 bpm 101 bpm 101 bpm   Heart Rate (Exit) 79 bpm  - 87 bpm 90 bpm 90 bpm   Oxygen Saturation (Admit) 96 %  - 95 % 96 % 96 %   Oxygen Saturation (Exercise) 94 %  - 91 % 92 % 92 %   Oxygen Saturation (Exit) 95 %  - 99 % 96 % 96 %   Rating of Perceived Exertion (Exercise) 13  - '13 15 15   '$ Perceived Dyspnea (Exercise) 3  - '3 4 4   '$ Symptoms none  - none None None   Duration Progress to 30 minutes of continuous aerobic without signs/symptoms of physical distress Progress to 30 minutes of continuous aerobic without signs/symptoms of physical distress Progress to 45 minutes of aerobic exercise without signs/symptoms of physical distress Progress to 45 minutes of aerobic exercise without signs/symptoms of physical distress Progress to 45 minutes of aerobic exercise without signs/symptoms of physical distress   Intensity THRR unchanged THRR unchanged THRR unchanged THRR unchanged THRR unchanged     Progression   Progression Continue to progress workloads to maintain intensity without signs/symptoms of physical distress. Continue to progress workloads to maintain intensity without signs/symptoms of physical distress. Continue to progress workloads to maintain intensity without signs/symptoms of physical distress. Continue to progress workloads to maintain intensity without signs/symptoms of physical distress. Continue to progress workloads to maintain intensity without signs/symptoms of physical distress.   Average METs 2.6 2.6 2.94 3.34 3.34     Resistance Training   Training Prescription Yes Yes Yes Yes Yes   Weight '2 2 3 3 3   '$ Reps 10-12 10-12 10-12 10-12 10-12     Interval Training   Interval Training No No No  No No     Treadmill   MPH 1.'5 2 2 2 2   '$ Grade 0 '1 1 1 1   '$ Minutes '10 10 10 10 15   '$ METs  -  - 2.81 2.81 2.81     NuStep   Level '2 2 3 3 3   '$ Watts '20 20 24 '$ 42 42   Minutes '16 16 15 15 15   '$ METs 1.8 1.8 2.3 3.5 3.5     REL-XR   Level '2 2 2 3 3   '$ Watts 40 50 50 52 52   Minutes '10 10 10 10 10   '$ METs 4 4 3.7 3.9 3.9   Row Name 08/19/15 1500 09/01/15 1500 09/02/15 1400         Exercise Review   Progression Yes Yes Yes       Response to Exercise   Blood Pressure (Admit) 132/72 102/60 118/64  Blood Pressure (Exercise) 146/84 134/78 146/78     Blood Pressure (Exit) 110/64 102/62 98/62     Heart Rate (Admit) 100 bpm 182 bpm 82 bpm     Heart Rate (Exercise) 105 bpm 90 bpm 99 bpm     Heart Rate (Exit) 94 bpm 88 bpm 88 bpm     Oxygen Saturation (Admit) 95 % 96 % 97 %     Oxygen Saturation (Exercise) 91 % 94 % 93 %     Oxygen Saturation (Exit) 98 % 96 % 96 %     Rating of Perceived Exertion (Exercise) '14 13 15     '$ Perceived Dyspnea (Exercise) '4 4 5     '$ Symptoms None None None     Duration Progress to 45 minutes of aerobic exercise without signs/symptoms of physical distress Progress to 45 minutes of aerobic exercise without signs/symptoms of physical distress Progress to 45 minutes of aerobic exercise without signs/symptoms of physical distress     Intensity THRR unchanged THRR unchanged THRR unchanged       Progression   Progression Continue to progress workloads to maintain intensity without signs/symptoms of physical distress. Continue to progress workloads to maintain intensity without signs/symptoms of physical distress. Continue to progress workloads to maintain intensity without signs/symptoms of physical distress.     Average METs 3.74 3.73 3.85       Resistance Training   Training Prescription Yes Yes Yes     Weight '3 3 3     '$ Reps 10-12 10-12 10-12       Interval Training   Interval Training No No No       Treadmill   MPH 2 2.1 2.1     Grade '1 1 1     '$ Minutes '15  15 15     '$ METs 2.81 2.81 2.81       NuStep   Level '4 4 4     '$ Minutes '15 15 15     '$ METs 3.3 3.7 3.6       REL-XR   Level '3 3 4     '$ Watts 66  -  -     Minutes '15 15 15     '$ METs 4.7 4.6 5       Home Exercise Plan   Plans to continue exercise at  - Home  treadmill Home  treadmill     Frequency  - Add 1 additional day to program exercise sessions. Add 1 additional day to program exercise sessions.        Exercise Comments:     Exercise Comments    Row Name 07/06/15 1201 07/07/15 1355 07/08/15 1400 07/13/15 1242 07/20/15 1219   Exercise Comments Today was the patient's first day of class. The patient's initial exercise prescription (based on the 6 min walk evaluation) was reviewed with the patient. The patient tolerated her exercise well with no symptoms or complications. All vitals were within acceptable ranges.  [name] is off to a good start for exercise.  We will continue to monitor for progression. Ms Vanoverbeke has doen well her first sessions and has been able to complete continous 10 min bouts on each machine.   Increases were made on the TM for patient. Changes were discussed with patient and tolerated well.  Increases made on the NS for the patient today. Changes were discussed with patient and tolerated well.   [place] Name 07/21/15 1358 08/05/15 1330 08/17/15 1055 08/19/15 1459 08/24/15 1150   Exercise Comments [name]  is doing great with exercise and making good progress.  Sh eis now up to 15 min on her first two machines.  We will continue to work on her progress and increasing her time on the treadmill. [name] continues to do well with exercise.  Shs is now up to 14 min on the treadmill.  We will continue to work with her on progression. 6 min walk evaluation was done today. Results were reviewed with patient.  [name] has been doing well with exercise.  She has noticed that when she sleeps well, she feels better overall.  She is making steady increases on workloads.  We will continue to  monitor her for progression. Reviewed home exercise with pt today.  Pt plans to use treadmill at home for exercise.  Reviewed THR, pulse, RPE, sign and symptoms, pulse oximetry, and when to call 911 or MD.  Also discussed weather considerations and indoor options.  Pt voiced understanding.   [place] Name 09/01/15 1529 09/02/15 1453         Exercise Comments [name] is doing well with exercise.  She continues to make improvements and will now be adding in one additional day a week at home.  We will continue to monitor her for progress.  [name] continues to do with exercise.  She is now up to level 4 on the XR.  We will continue to monitor for progression.         Discharge Exercise Prescription (Final Exercise Prescription Changes):     Exercise Prescription Changes - 09/02/15 1400      Exercise Review   Progression Yes     Response to Exercise   Blood Pressure (Admit) 118/64   Blood Pressure (Exercise) 146/78   Blood Pressure (Exit) 98/62   Heart Rate (Admit) 82 bpm   Heart Rate (Exercise) 99 bpm   Heart Rate (Exit) 88 bpm   Oxygen Saturation (Admit) 97 %   Oxygen Saturation (Exercise) 93 %   Oxygen Saturation (Exit) 96 %   Rating of Perceived Exertion (Exercise) 15   Perceived Dyspnea (Exercise) 5   Symptoms None   Duration Progress to 45 minutes of aerobic exercise without signs/symptoms of physical distress   Intensity THRR unchanged     Progression   Progression Continue to progress workloads to maintain intensity without signs/symptoms of physical distress.   Average METs 3.85     Resistance Training   Training Prescription Yes   Weight 3   Reps 10-12     Interval Training   Interval Training No     Treadmill   MPH 2.1   Grade 1   Minutes 15   METs 2.81     NuStep   Level 4   Minutes 15   METs 3.6     REL-XR   Level 4   Minutes 15   METs 5     Home Exercise Plan   Plans to continue exercise at Home  treadmill   Frequency Add 1 additional day to program  exercise sessions.       Nutrition:  Target Goals: Understanding of nutrition guidelines, daily intake of sodium '1500mg'$ , cholesterol '200mg'$ , calories 30% from fat and 7% or less from saturated fats, daily to have 5 or more servings of fruits and vegetables.  Biometrics:    Nutrition Therapy Plan and Nutrition Goals:     Nutrition Therapy & Goals - 07/23/15 1430      Nutrition Therapy   Diet Instructed patient on steps to  take to increase calories to prevent further weight loss and to promote weight gain.   Patient has lost approximately 30 lbs since 12/2014.   Protein (specify units) Minimum goal of 60 gms per day. Stressed importance of adequate protein for immune system and to maintain muscle strength.   Fiber 25 grams   Whole Grain Foods 3 servings   Saturated Fats 12 max. grams   Fruits and Vegetables 5 servings/day   Sodium 2000 grams     Personal Nutrition Goals   Personal Goal #1 Include at least 4 oz. of a protein food daily (refer to list). Supplement protein with 11 oz. of Premier protein shake split into 3 oz. servings 3-4 x/day so as to not overwhelm at one time.   Personal Goal #2 Add butter/margarine, mayonnaise or oil to any appropriate foods as discussed.   Personal Goal #3 Try to include 1 oz equivalent of protein with each meal.    Personal Goal #4 Eat 5-6 small meals/snacks per day when ever possible.   Comments Main goal presently is to prevent further weight loss and to promote weight gain.     Intervention Plan   Intervention Prescribe, educate and counsel regarding individualized specific dietary modifications aiming towards targeted core components such as weight, hypertension, lipid management, diabetes, heart failure and other comorbidities.;Nutrition handout(s) given to patient.   Expected Outcomes Short Term Goal: A plan has been developed with personal nutrition goals set during dietitian appointment.;Long Term Goal: Adherence to prescribed nutrition  plan.      Nutrition Discharge: Rate Your Plate Scores:   Psychosocial: Target Goals: Acknowledge presence or absence of depression, maximize coping skills, provide positive support system. Participant is able to verbalize types and ability to use techniques and skills needed for reducing stress and depression.  Initial Review & Psychosocial Screening:     Initial Psych Review & Screening - 06/30/15 0915      Family Dynamics   Good Support System? Yes   Comments Ms Elgin has good support from her son and daughter. Her sisters and brothers live close, so Ms Norment has help with house chores. She does take care of her husband which is very difficult, especially with her shortness of breath.  She is hopeful that pul rehab will help improve her quality of life.                                  Barriers   Psychosocial barriers to participate in program The patient should benefit from training in stress management and relaxation.     Screening Interventions   Interventions Program counselor consult;Encouraged to exercise      Quality of Life Scores:     Quality of Life - 06/30/15 0915      Quality of Life Scores   Health/Function Pre 15.75 %   Socioeconomic Pre 30 %   Psych/Spiritual Pre 20.57 %   Family Pre 26.4 %   GLOBAL Pre 21.09 %      PHQ-9: Recent Review Flowsheet Data    Depression screen [facility] 2/9 06/30/2015   Decreased Interest 0   Down, Depressed, Hopeless 0   PHQ - 2 Score 0   Altered sleeping 2   Tired, decreased energy 3   Change in appetite 3   Feeling bad or failure about yourself  0   Trouble concentrating 1   Moving slowly or fidgety/restless 2   Suicidal  thoughts 0   PHQ-9 Score 11   Difficult doing work/chores Very difficult      Psychosocial Evaluation and Intervention:     Psychosocial Evaluation - 07/13/15 1036      Psychosocial Evaluation & Interventions   Interventions Encouraged to exercise with the program and follow exercise  prescription   Comments Counselor met with Ms. McCalister today for initial psychosocial evaluation.  She is a 70 year old who has had lung disease since 1990 and was on antibiotics for almost 20 years.  As a result, she became immune to their benefits and has struggled more since 2016.  She has a strong support system with adult children who live close by and a strong faith community who support her.   Ms. Hable has a spouse as well, but he is disabled and requires a great deal of support himself.  She reports not sleeping well at all and "the Doctors won't give me anything because of my breathing."  She also states her appetite has diminished significantly over the past year and she has lost a great deal of weight.  She denies a history of depression or current symptoms, although she was tearful and states she does "get depressed sometime due to her health."  Ms. Cecilio struggles with her mood and admits her health is her primary stressor.  She has goals to gain weight and get stronger so she can enjoy life!  Counselor will follow with Ms. Cart on these goals and on her mood issues.  Counselor also recommended speaking with her doctor or pharmacist about a natural OTC sleep aid to help with this.  Also Ms. Lyday has an appointment to meet with the dietician for weight gain tips next week.     Continued Psychosocial Services Needed Yes  Counselor will follow with Ms. Reisig re: her mood while in this program to see if it improves as her health improves.  If not, counselor mentioned recommending she see a therapist for ongoing counseling services, and  speak to her Dr. Donivan Scull: a RX eval.      Psychosocial Re-Evaluation:     Psychosocial Re-Evaluation    [place] Name 07/29/15 1124 08/12/15 1223 08/19/15 1045 09/01/15 1531       Psychosocial Re-Evaluation   Interventions  -  -  - Encouraged to attend Pulmonary Rehabilitation for the exercise    Comments Counselor follow up with Ms. Rieger  today reporting she was put on an anti-depressant/anti-anxiety medication last week by her PA and she had a negative reaction almost immediately with heart palpitations and increased tearfulness as well as ongoing sleep issues.  She took for 3 days and decided to d/c even though she spoke with her pharmacist about cutting the dose in half (which she did) she felt it was still too much for her.  She has a follow up with her PCP about this in July and prefers not to take anything to treat these symptoms prior to this.  Counselor commended Ms. Pita for being so self-aware of her body and symptoms to take good care of herself.  Counselor encouraged her to inform her Dr. of this problem sooner so a possible change of medication could be attempted, but she states her body is sensitive to medication changes and would prefer to wait.  Counselor will continue to follow with Ms. Layson as needed.  Follow up with Ms. Pitt today stating she slept better last night and it has impacted her day in a positive  way.  She also reports having met with the dietician and has been started on a protein drink regimen to help her gain weight.  She continues to report restless legs causing her to have interrupted sleep and plans to discuss this with her Dr. next week on 7/18.  Counselor will follow with Ms. Teaster after that appointment to see if anything can be done about her restless legs and lack of sleep since it impacts her mood and everything she does.   Follow up with Ms. Strayer today reporting her daughter was hospitalized this morning for a heart condition and Ms. M is worried about her.  She states she has had a few bad days herself and saw the Dr. Wilburn Mylar.  He prescribed something for her "restless legs" and she took one last night.  She stated it made her feel weird, but she did sleep better.  Her mood continues to be stable but she is worried about her daughter and concerned whether this new medication will  alleviate any of her discomfort.  Counselor will continue to follow with her.   [name] expressed some interest in finding other patients with the same diagnosis to talk about their joint problems.  I shared her information with nurses over [facility] to share with their patients under the same diagnosis if they are interested.       Education: Education Goals: Education classes will be provided on a weekly basis, covering required topics. Participant will state understanding/return demonstration of topics presented.  Learning Barriers/Preferences:     Learning Barriers/Preferences - 06/30/15 0915      Learning Barriers/Preferences   Learning Barriers None   Learning Preferences Group Instruction;Individual Instruction;Pictoral;Skilled Demonstration;Verbal Instruction;Video;Written Material      Education Topics: Initial Evaluation Education: - Verbal, written and demonstration of respiratory meds, RPE/PD scales, oximetry and breathing techniques. Instruction on use of nebulizers and MDIs: cleaning and proper use, rinsing mouth with steroid doses and importance of monitoring MDI activations. Flowsheet Row Pulmonary Rehab from 09/04/2015 in [facility] Cardiac and Pulmonary Rehab  Date  06/30/15  Educator  LB  Instruction Review Code  2- meets goals/outcomes      General Nutrition Guidelines/Fats and Fiber: -Group instruction provided by verbal, written material, models and posters to present the general guidelines for heart healthy nutrition. Gives an explanation and review of dietary fats and fiber. Flowsheet Row Pulmonary Rehab from 09/04/2015 in [facility] Cardiac and Pulmonary Rehab  Date  08/24/15  Educator  CR  Instruction Review Code  2- meets goals/outcomes      Controlling Sodium/Reading Food Labels: -Group verbal and written material supporting the discussion of sodium use in heart healthy nutrition. Review and explanation with models, verbal and written materials for utilization of the  food label. Flowsheet Row Pulmonary Rehab from 09/04/2015 in [facility] Cardiac and Pulmonary Rehab  Date  07/20/15  Educator  CR  Instruction Review Code  2- meets goals/outcomes      Exercise Physiology & Risk Factors: - Group verbal and written instruction with models to review the exercise physiology of the cardiovascular system and associated critical values. Details cardiovascular disease risk factors and the goals associated with each risk factor.   Aerobic Exercise & Resistance Training: - Gives group verbal and written discussion on the health impact of inactivity. On the components of aerobic and resistive training programs and the benefits of this training and how to safely progress through these programs.   Flexibility, Balance, General Exercise Guidelines: - Provides group verbal  and written instruction on the benefits of flexibility and balance training programs. Provides general exercise guidelines with specific guidelines to those with heart or lung disease. Demonstration and skill practice provided. Flowsheet Row Pulmonary Rehab from 09/04/2015 in [facility] Cardiac and Pulmonary Rehab  Date  08/26/15  Educator  AS  Instruction Review Code  2- meets goals/outcomes      Stress Management: - Provides group verbal and written instruction about the health risks of elevated stress, cause of high stress, and healthy ways to reduce stress.   Depression: - Provides group verbal and written instruction on the correlation between heart/lung disease and depressed mood, treatment options, and the stigmas associated with seeking treatment. Flowsheet Row Pulmonary Rehab from 09/04/2015 in [facility] Cardiac and Pulmonary Rehab  Date  07/15/15  Educator  [facility]  Instruction Review Code  2- meets goals/outcomes      Exercise & Equipment Safety: - Individual verbal instruction and demonstration of equipment use and safety with use of the equipment.   Infection Prevention: - Provides verbal and written  material to individual with discussion of infection control including proper hand washing and proper equipment cleaning during exercise session. Flowsheet Row Pulmonary Rehab from 09/04/2015 in [facility] Cardiac and Pulmonary Rehab  Date  06/30/15  Educator  LB  Instruction Review Code  2- meets goals/outcomes      Falls Prevention: - Provides verbal and written material to individual with discussion of falls prevention and safety. Flowsheet Row Pulmonary Rehab from 09/04/2015 in [facility] Cardiac and Pulmonary Rehab  Date  06/30/15  Educator  LB  Instruction Review Code  2- meets goals/outcomes      Diabetes: - Individual verbal and written instruction to review signs/symptoms of diabetes, desired ranges of glucose level fasting, after meals and with exercise. Advice that pre and post exercise glucose checks will be done for 3 sessions at entry of program.   Chronic Lung Diseases: - Group verbal and written instruction to review new updates, new respiratory medications, new advancements in procedures and treatments. Provide informative websites and "800" numbers of self-education. Flowsheet Row Pulmonary Rehab from 09/04/2015 in [facility] Cardiac and Pulmonary Rehab  Date  08/17/15  Educator  LB  Instruction Review Code  2- meets goals/outcomes      Lung Procedures: - Group verbal and written instruction to describe testing methods done to diagnose lung disease. Review the outcome of test results. Describe the treatment choices: Pulmonary Function Tests, ABGs and oximetry.   Energy Conservation: - Provide group verbal and written instruction for methods to conserve energy, plan and organize activities. Instruct on pacing techniques, use of adaptive equipment and posture/positioning to relieve shortness of breath.   Triggers: - Group verbal and written instruction to review types of environmental controls: home humidity, furnaces, filters, dust mite/pet prevention, HEPA vacuums. To discuss weather  changes, air quality and the benefits of nasal washing.   Exacerbations: - Group verbal and written instruction to provide: warning signs, infection symptoms, calling MD promptly, preventive modes, and value of vaccinations. Review: effective airway clearance, coughing and/or vibration techniques. Create an Sports administrator. Flowsheet Row Pulmonary Rehab from 09/04/2015 in [facility] Cardiac and Pulmonary Rehab  Date  07/27/15  Educator  LB  Instruction Review Code  2- meets goals/outcomes      Oxygen: - Individual and group verbal and written instruction on oxygen therapy. Includes supplement oxygen, available portable oxygen systems, continuous and intermittent flow rates, oxygen safety, concentrators, and Medicare reimbursement for oxygen.   Respiratory Medications: -  Group verbal and written instruction to review medications for lung disease. Drug class, frequency, complications, importance of spacers, rinsing mouth after steroid MDI's, and proper cleaning methods for nebulizers. Flowsheet Row Pulmonary Rehab from 09/04/2015 in [facility] Cardiac and Pulmonary Rehab  Date  06/30/15  Educator  LB  Instruction Review Code  2- meets goals/outcomes      AED/CPR: - Group verbal and written instruction with the use of models to demonstrate the basic use of the AED with the basic ABC's of resuscitation. Flowsheet Row Pulmonary Rehab from 09/04/2015 in [facility] Cardiac and Pulmonary Rehab  Date  09/04/15  Educator  CE  Instruction Review Code  2- meets goals/outcomes      Breathing Retraining: - Provides individuals verbal and written instruction on purpose, frequency, and proper technique of diaphragmatic breathing and pursed-lipped breathing. Applies individual practice skills. Flowsheet Row Pulmonary Rehab from 09/04/2015 in [facility] Cardiac and Pulmonary Rehab  Date  06/30/15  Educator  LB  Instruction Review Code  2- meets goals/outcomes      Anatomy and Physiology of the Lungs: - Group verbal and  written instruction with the use of models to provide basic lung anatomy and physiology related to function, structure and complications of lung disease. Flowsheet Row Pulmonary Rehab from 09/04/2015 in [facility] Cardiac and Pulmonary Rehab  Date  07/17/15  Educator  SJ  Instruction Review Code  2- meets goals/outcomes      Heart Failure: - Group verbal and written instruction on the basics of heart failure: signs/symptoms, treatments, explanation of ejection fraction, enlarged heart and cardiomyopathy.   Sleep Apnea: - Individual verbal and written instruction to review Obstructive Sleep Apnea. Review of risk factors, methods for diagnosing and types of masks and machines for OSA.   Anxiety: - Provides group, verbal and written instruction on the correlation between heart/lung disease and anxiety, treatment options, and management of anxiety. Flowsheet Row Pulmonary Rehab from 09/04/2015 in [facility] Cardiac and Pulmonary Rehab  Date  08/12/15  Educator  [facility]  Instruction Review Code  2- Meets goals/outcomes      Relaxation: - Provides group, verbal and written instruction about the benefits of relaxation for patients with heart/lung disease. Also provides patients with examples of relaxation techniques.   Knowledge Questionnaire Score:     Knowledge Questionnaire Score - 06/30/15 0915      Knowledge Questionnaire Score   Pre Score 9/10       Core Components/Risk Factors/Patient Goals at Admission:     Personal Goals and Risk Factors at Admission - 06/30/15 1157      Core Components/Risk Factors/Patient Goals on Admission    Weight Management --  Ms Racicot prefers not to meet with the dietitian. She has lost 20lbs due to her bronchiectasis. Supplements, such as boost, Ensure make her have diarrhea. She cooks healthy and does eat out occasionally.      Core Components/Risk Factors/Patient Goals Review:      Goals and Risk Factor Review    Row Name 07/06/15 1000 07/08/15 1251  07/17/15 1234 07/20/15 1608 07/24/15 1235     Core Components/Risk Factors/Patient Goals Review   Personal Goals Review Develop more efficient breathing techniques such as purse lipped breathing and diaphragmatic breathing and practicing self-pacing with activity.  - Increase knowledge of respiratory medications and ability to use respiratory devices properly. Increase knowledge of respiratory medications and ability to use respiratory devices properly. Develop more efficient breathing techniques such as purse lipped breathing and diaphragmatic breathing and practicing self-pacing with  activity.   Review Reviewed PLB technique; good demonstration of PLB and used it with her exercise goals Ms Myles Lipps arrived very shor of breath. We discussed pacing herself and sing PLB walking to class. After she rested, she did very good with her exercise goals. She is now 108.9lbx and has a very poor appetitie. She would now like to meet with the dietitian  for options to help her gain weight; even drinks like Boost upset her stomach. Ms. Harner was given a flutter valve today to use for her secreations.  She demonstrated proper technique when using the flutter valve.  Followed up on Ms Haye and the flutter valve. She is using it, but has no had trouble bringing up her secretions. Using her SVN when she is wheezy. Keeps it clean with soapy water and once a week with vinigar. I reviewed PLB with Ms. Kijowski.  She demonstrates proper technique and knows when to use it.   Expected Outcomes Continue using PLB with exercise goals and activity at home.  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 Continue using Flutter Valve and SVN to manage her secretions and wheezing episodes. PLB will help her with SOB while exercising and with ADLs.  This will help her to be able to do more in both area of her life.    Row Name 07/24/15 1236 08/03/15 1618 08/17/15 1528  08/19/15 1158 08/19/15 1331     Core Components/Risk Factors/Patient Goals Review   Personal Goals Review Improve shortness of breath with ADL's Sedentary;Increase knowledge of respiratory medications and ability to use respiratory devices properly. Increase Strength and Stamina Increase knowledge of respiratory medications and ability to use respiratory devices properly. Weight Management/Obesity   Review Talked with Ms. MsAlister about a chest vest.  She has an extreme about of mucous that is causing continious coughing spells.  I told her to speak with her physician about  aquiring one if possible. Ms Moncivais uses her SVN when she feels tight chested and wheezing. She has a good understanding of albuterol and cleaning her nebulizer. She also walked on her treadmill at home for 5 minutes and plans to increase her time on it on off days from  [facility]. Ms Wike improved her Mid by 139ft. Minimal Importance difference with COPD is 98.27ft. Although she has Bronchectasis, this feet improvement is comparible. Discussed using the medication, theophylline. She does have an appointment with Dr Guido Sander soon and can question about this medication. [name] is trying to gain weight. Some days she finds it hard to heat because of her cough and breathing concerns.    Expected Outcomes Hopefully the chest vest will help manage her secreations and help improve her SOB and ADLs.  Continue exercising on home TM - increasing her time gradually.  - Adding theophylline possibly  to improve her breathing. [name] will use her good breathing days to eat more calories to aid in her weight gain goal. To see weight gain or maintenance during program.   Row Name 08/24/15 1151 08/24/15 1508           Core Components/Risk Factors/Patient Goals Review   Personal Goals Review Sedentary;Improve shortness of breath with ADL's;Increase Strength and Stamina Improve shortness of breath with ADL's;Develop more efficient breathing  techniques such as purse lipped breathing and diaphragmatic breathing and practicing self-pacing with activity.      Review [name] has not been doing much at home recently due  to the weather.  She does note that she is feeling better with more strength and stamina on more days. Ms Strebel has managed her bronchectasis for many years. She has good days and bad days with her breathing and manages her breathing with PLB, pacing ,and limiting her activity as needed. she knows when to say "no" .      Expected Outcomes We discussed home exercise guidelines today to help boost her stamina at home.  She will continue to come to class for exercise as well.  -         Core Components/Risk Factors/Patient Goals at Discharge (Final Review):      Goals and Risk Factor Review - 08/24/15 1508      Core Components/Risk Factors/Patient Goals Review   Personal Goals Review Improve shortness of breath with ADL's;Develop more efficient breathing techniques such as purse lipped breathing and diaphragmatic breathing and practicing self-pacing with activity.   Review Ms Bilotta has managed her bronchectasis for many years. She has good days and bad days with her breathing and manages her breathing with PLB, pacing ,and limiting her activity as needed. she knows when to say "no" .      ITP Comments:     ITP Comments    Row Name 07/10/15 1121 07/15/15 1128 08/07/15 1049 08/10/15 1118 08/19/15 1156   ITP Comments Today [name] complained of feeling dizzy after she moved from the NuStep to the treadmill. She sat down and rested for a few minutes.  BP check revealed 88/60. Skin warm and dry.   Given 2 full cups of water before she felt better.  BP reading after the water 124/76. She resumed her exercise and no more concerns occurred.  Talked to her about staying hydrated before, during andafter class.  She voiced understanding.  [name] was concerned about her BP being low and has had times at home she felt dizzy.  I  recommended she call her Dr. to discuss symptoms and meds. Meta has a chronic cough that her MD is treating but continues to do well exercising in [facility].  Educated on A-fib by Carson Myrtle (respiratory therapist). Discussed what to do when it occurs (rest) and medication managment. Will be bringing the name of her A-fib med next session to update our med list.  Ms Koller is having a bad breathing day, as well as yesterday. I talked with her about taking theophylline and recommended to talk to Dr Cyndie Chime about this treatment. She was too short of breath to exercise today and left early from [place].       Comments: 30 Day Review

## 2015-09-09 DIAGNOSIS — J479 Bronchiectasis, uncomplicated: Secondary | ICD-10-CM | POA: Diagnosis not present

## 2015-09-09 NOTE — Progress Notes (Signed)
Daily Session Note  Patient Details  Name: Ana Washington MRN: [id number] Date of Birth: 1945/04/01 Referring Provider:   [name] Pulmonary Rehab from 07/08/2015 in [facility] Cardiac and Pulmonary Rehab  Referring Provider  [name]      Encounter Date: 09/09/2015  Check In:     Session Check In - 09/09/15 1239      Check-In   Location [facility]-Cardiac & Pulmonary Rehab   Staff Present Heath Lark, RN, BSN, CCRP;Laureen Owens Shark, BS, RRT, Respiratory Dareen Piano, BA, ACSM CEP, Exercise Physiologist   Supervising physician immediately available to respond to emergencies [facility] immediately available ER MD   Physician(s) Marcelene Butte and Jimmye Norman   Medication changes reported     No   Fall or balance concerns reported    No   Warm-up and Cool-down Performed on first and last piece of equipment   Resistance Training Performed Yes   VAD Patient? No     VAD patient   Has back up controller? No     Pain Assessment   Currently in Pain? No/denies   Multiple Pain Sites No         Goals Met:  Proper associated with RPD/PD & O2 Sat Independence with exercise equipment Exercise tolerated well Strength training completed today  Goals Unmet:  Not Applicable  Comments: Pt able to follow exercise prescription today without complaint.  Will continue to monitor for progression.    Dr. Emily Filbert is Medical Director for [place] and [facility] Pulmonary Rehabilitation.

## 2015-09-11 ENCOUNTER — Encounter: Payer: Medicare Other | Admitting: *Deleted

## 2015-09-11 DIAGNOSIS — J479 Bronchiectasis, uncomplicated: Secondary | ICD-10-CM | POA: Diagnosis not present

## 2015-09-11 NOTE — Progress Notes (Signed)
Daily Session Note  Patient Details  Name: Ana Washington MRN: [id number] Date of Birth: 1945/05/04 Referring Provider:   [name] Pulmonary Rehab from 07/08/2015 in [facility] Cardiac and Pulmonary Rehab  Referring Provider  [name]      Encounter Date: 09/11/2015  Check In:     Session Check In - 09/11/15 1115      Check-In   Location [facility]-Cardiac & Pulmonary Rehab   Staff Present Heath Lark, RN, BSN, CCRP;Terrence Pizana, RN, BSN   Supervising physician immediately available to respond to emergencies [facility] immediately available ER MD   Physician(s) Dr. Jacqualine Code and Dr. Corky Downs   Medication changes reported     No   Fall or balance concerns reported    No   Warm-up and Cool-down Performed on first and last piece of equipment   Resistance Training Performed Yes   VAD Patient? No     Pain Assessment   Currently in Pain? No/denies         Goals Met:  Proper associated with RPD/PD & O2 Sat Exercise tolerated well Strength training completed today  Goals Unmet:  Not Applicable  Comments: Progressing well with exercise prescription.    Dr. Emily Filbert is Medical Director for [place] and [facility] Pulmonary Rehabilitation.

## 2015-09-14 ENCOUNTER — Encounter: Payer: Medicare Other | Admitting: *Deleted

## 2015-09-14 DIAGNOSIS — J479 Bronchiectasis, uncomplicated: Secondary | ICD-10-CM

## 2015-09-14 NOTE — Progress Notes (Signed)
Daily Session Note  Patient Details  Name: Ana Washington MRN: [id number] Date of Birth: [birth year] Referring Provider:   [name] Pulmonary Rehab from 07/08/2015 in [facility] Cardiac and Pulmonary Rehab  Referring Provider  [name]      Encounter Date: 09/14/2015  Check In:     Session Check In - 09/14/15 1024      Check-In   Staff Present Carson Myrtle, BS, RRT, Respiratory Bertis Ruddy, BS, ACSM CEP, Exercise Physiologist;Rebecca Brayton El, DPT, CEEA   Supervising physician immediately available to respond to emergencies [facility] immediately available ER MD   Physician(s) Dr. Bayard Beaver and Dr. Marcelene Butte   Medication changes reported     No   Fall or balance concerns reported    No   Warm-up and Cool-down Performed on first and last piece of equipment   Resistance Training Performed Yes   VAD Patient? No     VAD patient   Has back up controller? No     Pain Assessment   Currently in Pain? No/denies   Multiple Pain Sites No         Goals Met:  Proper associated with RPD/PD & O2 Sat Independence with exercise equipment Exercise tolerated well Strength training completed today  Goals Unmet:  Not Applicable  Comments: Pt able to follow exercise prescription today without complaint.  Will continue to monitor for progression.    Dr. Emily Filbert is Medical Director for [place] and [facility] Pulmonary Rehabilitation.

## 2015-09-14 NOTE — Progress Notes (Signed)
Pulmonary Individual Treatment Plan  Patient Details  Name: LEIGHLA CHESTNUTT MRN: [id number] Date of Birth: [birth year] Referring Provider:   [name] Pulmonary Rehab from 07/08/2015 in [facility] Cardiac and Pulmonary Rehab  Referring Provider  [name]      Initial Encounter Date:  Flowsheet Row Pulmonary Rehab from 07/08/2015 in [facility] Cardiac and Pulmonary Rehab  Referring Provider  [name]      Visit Diagnosis: Bronchiectasis without complication ([place])  Patient's Home Medications on Admission:  Current Outpatient Prescriptions:  .  alendronate (FOSAMAX) 70 MG tablet, take 1 tablet by mouth every week, Disp: , Rfl:  .  amLODipine (NORVASC) 5 MG tablet, take 1 tablet by mouth once daily, Disp: , Rfl:  .  azithromycin (ZITHROMAX) 250 MG tablet, Take by mouth., Disp: , Rfl:  .  Calcium Carbonate-Vitamin D (OYSTER SHELL/VITAMIN D) 600-125 MG-UNIT TABS, Take by mouth., Disp: , Rfl:  .  Cholecalciferol (VITAMIN D-1000 MAX ST) 1000 UNITS tablet, Take by mouth., Disp: , Rfl:  .  ciprofloxacin (CIPRO) 500 MG tablet, Take by mouth., Disp: , Rfl:  .  diclofenac sodium (VOLTAREN) 1 % GEL, Apply 2 g topically 4 (four) times daily., Disp: 100 g, Rfl: 1 .  fluocinonide (LIDEX) 0.05 % external solution, prn as needed, Disp: , Rfl:  .  fluticasone (FLONASE) 50 MCG/ACT nasal spray, Place into the nose., Disp: , Rfl:  .  hydrochlorothiazide (MICROZIDE) 12.5 MG capsule, take 1 capsule by mouth once daily, Disp: , Rfl:  .  ketoconazole (NIZORAL) 2 % shampoo, Apply topically., Disp: , Rfl:  .  losartan (COZAAR) 100 MG tablet, take 1 tablet by mouth once daily, Disp: , Rfl:  .  metoprolol succinate (TOPROL-XL) 50 MG 24 hr tablet, Take by mouth., Disp: , Rfl:  .  omeprazole (PRILOSEC OTC) 20 MG tablet, Take by mouth., Disp: , Rfl:  .  rivaroxaban (XARELTO) 20 MG TABS tablet, Take by mouth., Disp: , Rfl:  .  triamcinolone cream (KENALOG) 0.1 %, prn as needed, Disp: , Rfl:   Past Medical  History: Past Medical History:  Diagnosis Date  . 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 Hypertension   . Lung disease     Tobacco Use: History  Smoking Status  . Never Smoker  Smokeless Tobacco  . Not on file    Labs: Recent Review Flowsheet Data    There is no flowsheet data to display.       ADL UCSD:     Pulmonary Assessment Scores    Row Name 06/30/15 1032 08/17/15 1521       ADL UCSD   ADL Phase  - Mid    SOB Score total 77 81    Rest 1 2    Walk 3 3    Stairs 4 4    Bath 4 3    Dress 3 3    Shop 4 5       Pulmonary Function Assessment:     Pulmonary Function Assessment - 06/30/15 0915      Initial Spirometry Results   FVC% 37 %   FEV1% 42 %   FEV1/FVC Ratio 86.96     Post Bronchodilator Spirometry Results   FVC% 49 %   FEV1% 46 %   FEV1/FVC Ratio 80.79     Breath   Bilateral Breath Sounds Clear;Decreased   Shortness of Breath Yes;Fear of Shortness of Breath;Limiting activity      Exercise Target Goals:    Exercise Program Goal: Individual  exercise prescription set with THRR, safety & activity barriers. Participant demonstrates ability to understand and report RPE using BORG scale, to self-measure pulse accurately, and to acknowledge the importance of the exercise prescription.  Exercise Prescription Goal: Starting with aerobic activity 30 plus minutes a day, 3 days per week for initial exercise prescription. Provide home exercise prescription and guidelines that participant acknowledges understanding prior to discharge.  Activity Barriers & Risk Stratification:     Activity Barriers & Cardiac Risk Stratification - 06/30/15 0915      Activity Barriers & Cardiac Risk Stratification   Activity Barriers Shortness of Breath;Deconditioning;Muscular Weakness;Other (comment)   Comments Chronic cough   Cardiac Risk Stratification High      6 Minute Walk:     6 Minute Walk    Row Name 06/30/15 1128 08/17/15 1042       6  Minute Walk   Phase  - Mid Program    Distance 912 feet 1025 feet    Distance % Change  - 12 %    Walk Time 4.73 minutes 6 minutes    # of Rest Breaks 1  rest 1:16  1  Rested due to SOB    MPH 2.19 1.94    METS 1.5 2.49    RPE 15 13    Perceived Dyspnea  7 4    VO2 Peak  - 8.71    Symptoms Yes (comment) Yes (comment)    Comments shortness of breath Shortness of breath-rested then resumed walk    Resting HR 80 bpm 79 bpm    Resting BP 120/80 118/60    Max Ex. HR 114 bpm 105 bpm    Max Ex. BP 158/90 132/82    2 Minute Post BP 126/78 122/70      Interval HR   Baseline HR 80 79    1 Minute HR 98 98    2 Minute HR 104 105    3 Minute HR 111 98    4 Minute HR  - 92    5 Minute HR 114 98    6 Minute HR 114 98    2 Minute Post HR 88 85    Interval Heart Rate? Yes Yes      Interval Oxygen   Interval Oxygen? Yes Yes    Baseline Oxygen Saturation % 96 % 97 %    Baseline Liters of Oxygen  - 0 L    1 Minute Oxygen Saturation % 97 % 94 %    1 Minute Liters of Oxygen  - 0 L    2 Minute Oxygen Saturation % 93 % 96 %    2 Minute Liters of Oxygen  - 0 L    3 Minute Oxygen Saturation % 93 % 93 %    3 Minute Liters of Oxygen  - 0 L    4 Minute Oxygen Saturation %  - 97 %    4 Minute Liters of Oxygen  - 0 L    5 Minute Oxygen Saturation % 91 % 95 %    5 Minute Liters of Oxygen  - 0 L    6 Minute Oxygen Saturation % 93 % 93 %    6 Minute Liters of Oxygen  - 0 L    2 Minute Post Oxygen Saturation % 98 % 98 %    2 Minute Post Liters of Oxygen  - 0 L       Initial Exercise Prescription:     Initial Exercise  Prescription - 07/08/15 1400      Date of Initial Exercise RX and Referring Provider   Referring Provider [name]      Perform Capillary Blood Glucose checks as needed.  Exercise Prescription Changes:     Exercise Prescription Changes    Row Name 07/06/15 1500 07/17/15 1200 07/21/15 1300 08/05/15 1300 08/07/15 1000     Exercise Review   Progression  -  - Yes  Yes Yes     Response to Exercise   Blood Pressure (Admit) 124/66  - 110/70 116/68 116/68   Blood Pressure (Exercise) 118/60  - 130/68 130/70 130/70   Blood Pressure (Exit) 102/80  - 100/66 102/60 102/60   Heart Rate (Admit) 91 bpm  - 103 bpm 83 bpm 83 bpm   Heart Rate (Exercise) 87 bpm  - 98 bpm 101 bpm 101 bpm   Heart Rate (Exit) 79 bpm  - 87 bpm 90 bpm 90 bpm   Oxygen Saturation (Admit) 96 %  - 95 % 96 % 96 %   Oxygen Saturation (Exercise) 94 %  - 91 % 92 % 92 %   Oxygen Saturation (Exit) 95 %  - 99 % 96 % 96 %   Rating of Perceived Exertion (Exercise) 13  - '13 15 15   '$ Perceived Dyspnea (Exercise) 3  - '3 4 4   '$ Symptoms none  - none None None   Duration Progress to 30 minutes of continuous aerobic without signs/symptoms of physical distress Progress to 30 minutes of continuous aerobic without signs/symptoms of physical distress Progress to 45 minutes of aerobic exercise without signs/symptoms of physical distress Progress to 45 minutes of aerobic exercise without signs/symptoms of physical distress Progress to 45 minutes of aerobic exercise without signs/symptoms of physical distress   Intensity THRR unchanged THRR unchanged THRR unchanged THRR unchanged THRR unchanged     Progression   Progression Continue to progress workloads to maintain intensity without signs/symptoms of physical distress. Continue to progress workloads to maintain intensity without signs/symptoms of physical distress. Continue to progress workloads to maintain intensity without signs/symptoms of physical distress. Continue to progress workloads to maintain intensity without signs/symptoms of physical distress. Continue to progress workloads to maintain intensity without signs/symptoms of physical distress.   Average METs 2.6 2.6 2.94 3.34 3.34     Resistance Training   Training Prescription Yes Yes Yes Yes Yes   Weight '2 2 3 3 3   '$ Reps 10-12 10-12 10-12 10-12 10-12     Interval Training   Interval Training No No No  No No     Treadmill   MPH 1.'5 2 2 2 2   '$ Grade 0 '1 1 1 1   '$ Minutes '10 10 10 10 15   '$ METs  -  - 2.81 2.81 2.81     NuStep   Level '2 2 3 3 3   '$ Watts '20 20 24 '$ 42 42   Minutes '16 16 15 15 15   '$ METs 1.8 1.8 2.3 3.5 3.5     REL-XR   Level '2 2 2 3 3   '$ Watts 40 50 50 52 52   Minutes '10 10 10 10 10   '$ METs 4 4 3.7 3.9 3.9   Row Name 08/19/15 1500 09/01/15 1500 09/02/15 1400 09/14/15 1100       Exercise Review   Progression Yes Yes Yes Yes      Response to Exercise   Blood Pressure (Admit) 132/72 102/60 118/64 118/66  Blood Pressure (Exercise) 146/84 134/78 146/78 118/68    Blood Pressure (Exit) 110/64 102/62 98/62  -    Heart Rate (Admit) 100 bpm 182 bpm 82 bpm 86 bpm    Heart Rate (Exercise) 105 bpm 90 bpm 99 bpm 92 bpm    Heart Rate (Exit) 94 bpm 88 bpm 88 bpm  -    Oxygen Saturation (Admit) 95 % 96 % 97 % 93 %    Oxygen Saturation (Exercise) 91 % 94 % 93 % 92 %    Oxygen Saturation (Exit) 98 % 96 % 96 %  -    Rating of Perceived Exertion (Exercise) '14 13 15 15    '$ Perceived Dyspnea (Exercise) '4 4 5 4    '$ Symptoms None None None None    Duration Progress to 45 minutes of aerobic exercise without signs/symptoms of physical distress Progress to 45 minutes of aerobic exercise without signs/symptoms of physical distress Progress to 45 minutes of aerobic exercise without signs/symptoms of physical distress Progress to 45 minutes of aerobic exercise without signs/symptoms of physical distress    Intensity THRR unchanged THRR unchanged THRR unchanged THRR unchanged  108-136      Progression   Progression Continue to progress workloads to maintain intensity without signs/symptoms of physical distress. Continue to progress workloads to maintain intensity without signs/symptoms of physical distress. Continue to progress workloads to maintain intensity without signs/symptoms of physical distress. Continue to progress workloads to maintain intensity without signs/symptoms of physical distress.     Average METs 3.74 3.73 3.85 3.85      Resistance Training   Training Prescription Yes Yes Yes Yes    Weight '3 3 3 3    '$ Reps 10-12 10-12 10-12 10-12      Interval Training   Interval Training No No No No      Treadmill   MPH 2 2.1 2.1 2.2    Grade '1 1 1 1    '$ Minutes '15 15 15 15    '$ METs 2.81 2.81 2.81 2.81      NuStep   Level '4 4 4 4    '$ Minutes '15 15 15 15    '$ METs 3.3 3.7 3.6 3.6      REL-XR   Level '3 3 4 4    '$ Watts 66  -  -  -    Minutes '15 15 15 15    '$ METs 4.7 4.'6 5 5      '$ Home Exercise Plan   Plans to continue exercise at  - Home  treadmill Home  treadmill Home  treadmill    Frequency  - Add 1 additional day to program exercise sessions. Add 1 additional day to program exercise sessions. Add 1 additional day to program exercise sessions.       Exercise Comments:     Exercise Comments    Row Name 07/06/15 1201 07/07/15 1355 07/08/15 1400 07/13/15 1242 07/20/15 1219   Exercise Comments Today was the patient's first day of class. The patient's initial exercise prescription (based on the 6 min walk evaluation) was reviewed with the patient. The patient tolerated her exercise well with no symptoms or complications. All vitals were within acceptable ranges.  [name] is off to a good start for exercise.  We will continue to monitor for progression. Ms Giarrusso has doen well her first sessions and has been able to complete continous 10 min bouts on each machine.   Increases were made on the TM for patient. Changes were discussed  with patient and tolerated well.  Increases made on the NS for the patient today. Changes were discussed with patient and tolerated well.   [place] Name 07/21/15 1358 08/05/15 1330 08/17/15 1055 08/19/15 1459 08/24/15 1150   Exercise Comments [name] is doing great with exercise and making good progress.  Sh eis now up to 15 min on her first two machines.  We will continue to work on her progress and increasing her time on the treadmill. [name] continues to do  well with exercise.  Shs is now up to 14 min on the treadmill.  We will continue to work with her on progression. 6 min walk evaluation was done today. Results were reviewed with patient.  [name] has been doing well with exercise.  She has noticed that when she sleeps well, she feels better overall.  She is making steady increases on workloads.  We will continue to monitor her for progression. Reviewed home exercise with pt today.  Pt plans to use treadmill at home for exercise.  Reviewed THR, pulse, RPE, sign and symptoms, pulse oximetry, and when to call 911 or MD.  Also discussed weather considerations and indoor options.  Pt voiced understanding.   [place] Name 09/01/15 1529 09/02/15 1453         Exercise Comments [name] is doing well with exercise.  She continues to make improvements and will now be adding in one additional day a week at home.  We will continue to monitor her for progress.  [name] continues to do with exercise.  She is now up to level 4 on the XR.  We will continue to monitor for progression.         Discharge Exercise Prescription (Final Exercise Prescription Changes):     Exercise Prescription Changes - 09/14/15 1100      Exercise Review   Progression Yes     Response to Exercise   Blood Pressure (Admit) 118/66   Blood Pressure (Exercise) 118/68   Heart Rate (Admit) 86 bpm   Heart Rate (Exercise) 92 bpm   Oxygen Saturation (Admit) 93 %   Oxygen Saturation (Exercise) 92 %   Rating of Perceived Exertion (Exercise) 15   Perceived Dyspnea (Exercise) 4   Symptoms None   Duration Progress to 45 minutes of aerobic exercise without signs/symptoms of physical distress   Intensity THRR unchanged  108-136     Progression   Progression Continue to progress workloads to maintain intensity without signs/symptoms of physical distress.   Average METs 3.85     Resistance Training   Training Prescription Yes   Weight 3   Reps 10-12     Interval Training   Interval Training No      Treadmill   MPH 2.2   Grade 1   Minutes 15   METs 2.81     NuStep   Level 4   Minutes 15   METs 3.6     REL-XR   Level 4   Minutes 15   METs 5     Home Exercise Plan   Plans to continue exercise at Home  treadmill   Frequency Add 1 additional day to program exercise sessions.       Nutrition:  Target Goals: Understanding of nutrition guidelines, daily intake of sodium '1500mg'$ , cholesterol '200mg'$ , calories 30% from fat and 7% or less from saturated fats, daily to have 5 or more servings of fruits and vegetables.  Biometrics:    Nutrition Therapy Plan and Nutrition Goals:  Nutrition Therapy & Goals - 07/23/15 1430      Nutrition Therapy   Diet Instructed patient on steps to take to increase calories to prevent further weight loss and to promote weight gain.   Patient has lost approximately 30 lbs since 12/2014.   Protein (specify units) Minimum goal of 60 gms per day. Stressed importance of adequate protein for immune system and to maintain muscle strength.   Fiber 25 grams   Whole Grain Foods 3 servings   Saturated Fats 12 max. grams   Fruits and Vegetables 5 servings/day   Sodium 2000 grams     Personal Nutrition Goals   Personal Goal #1 Include at least 4 oz. of a protein food daily (refer to list). Supplement protein with 11 oz. of Premier protein shake split into 3 oz. servings 3-4 x/day so as to not overwhelm at one time.   Personal Goal #2 Add butter/margarine, mayonnaise or oil to any appropriate foods as discussed.   Personal Goal #3 Try to include 1 oz equivalent of protein with each meal.    Personal Goal #4 Eat 5-6 small meals/snacks per day when ever possible.   Comments Main goal presently is to prevent further weight loss and to promote weight gain.     Intervention Plan   Intervention Prescribe, educate and counsel regarding individualized specific dietary modifications aiming towards targeted core components such as weight, hypertension,  lipid management, diabetes, heart failure and other comorbidities.;Nutrition handout(s) given to patient.   Expected Outcomes Short Term Goal: A plan has been developed with personal nutrition goals set during dietitian appointment.;Long Term Goal: Adherence to prescribed nutrition plan.      Nutrition Discharge: Rate Your Plate Scores:   Psychosocial: Target Goals: Acknowledge presence or absence of depression, maximize coping skills, provide positive support system. Participant is able to verbalize types and ability to use techniques and skills needed for reducing stress and depression.  Initial Review & Psychosocial Screening:     Initial Psych Review & Screening - 06/30/15 0915      Family Dynamics   Good Support System? Yes   Comments Ms Inocencio has good support from her son and daughter. Her sisters and brothers live close, so Ms Betley has help with house chores. She does take care of her husband which is very difficult, especially with her shortness of breath.  She is hopeful that pul rehab will help improve her quality of life.                                  Barriers   Psychosocial barriers to participate in program The patient should benefit from training in stress management and relaxation.     Screening Interventions   Interventions Program counselor consult;Encouraged to exercise      Quality of Life Scores:     Quality of Life - 06/30/15 0915      Quality of Life Scores   Health/Function Pre 15.75 %   Socioeconomic Pre 30 %   Psych/Spiritual Pre 20.57 %   Family Pre 26.4 %   GLOBAL Pre 21.09 %      PHQ-9: Recent Review Flowsheet Data    Depression screen [facility] 2/9 06/30/2015   Decreased Interest 0   Down, Depressed, Hopeless 0   PHQ - 2 Score 0   Altered sleeping 2   Tired, decreased energy 3   Change in appetite 3   Feeling  bad or failure about yourself  0   Trouble concentrating 1   Moving slowly or fidgety/restless 2   Suicidal thoughts 0    PHQ-9 Score 11   Difficult doing work/chores Very difficult      Psychosocial Evaluation and Intervention:     Psychosocial Evaluation - 07/13/15 1036      Psychosocial Evaluation & Interventions   Interventions Encouraged to exercise with the program and follow exercise prescription   Comments Counselor met with Ms. McCalister today for initial psychosocial evaluation.  She is a 70 year old who has had lung disease since 1990 and was on antibiotics for almost 20 years.  As a result, she became immune to their benefits and has struggled more since 2016.  She has a strong support system with adult children who live close by and a strong faith community who support her.   Ms. Garmany has a spouse as well, but he is disabled and requires a great deal of support himself.  She reports not sleeping well at all and "the Doctors won't give me anything because of my breathing."  She also states her appetite has diminished significantly over the past year and she has lost a great deal of weight.  She denies a history of depression or current symptoms, although she was tearful and states she does "get depressed sometime due to her health."  Ms. Rhinehart struggles with her mood and admits her health is her primary stressor.  She has goals to gain weight and get stronger so she can enjoy life!  Counselor will follow with Ms. Pascal on these goals and on her mood issues.  Counselor also recommended speaking with her doctor or pharmacist about a natural OTC sleep aid to help with this.  Also Ms. Kearley has an appointment to meet with the dietician for weight gain tips next week.     Continued Psychosocial Services Needed Yes  Counselor will follow with Ms. Hadley re: her mood while in this program to see if it improves as her health improves.  If not, counselor mentioned recommending she see a therapist for ongoing counseling services, and  speak to her Dr. Donivan Scull: a RX eval.      Psychosocial  Re-Evaluation:     Psychosocial Re-Evaluation    [employer] Name 07/29/15 1124 08/12/15 1223 08/19/15 1045 09/01/15 1531 09/09/15 1102     Psychosocial Re-Evaluation   Interventions  -  -  - Encouraged to attend Pulmonary Rehabilitation for the exercise  -   Comments Counselor follow up with Ms. Sosinski today reporting she was put on an anti-depressant/anti-anxiety medication last week by her PA and she had a negative reaction almost immediately with heart palpitations and increased tearfulness as well as ongoing sleep issues.  She took for 3 days and decided to d/c even though she spoke with her pharmacist about cutting the dose in half (which she did) she felt it was still too much for her.  She has a follow up with her PCP about this in July and prefers not to take anything to treat these symptoms prior to this.  Counselor commended Ms. Bendorf for being so self-aware of her body and symptoms to take good care of herself.  Counselor encouraged her to inform her Dr. of this problem sooner so a possible change of medication could be attempted, but she states her body is sensitive to medication changes and would prefer to wait.  Counselor will continue to follow with Ms. Gist as  needed.  Follow up with Ms. Kretz today stating she slept better last night and it has impacted her day in a positive way.  She also reports having met with the dietician and has been started on a protein drink regimen to help her gain weight.  She continues to report restless legs causing her to have interrupted sleep and plans to discuss this with her Dr. next week on 7/18.  Counselor will follow with Ms. Lerner after that appointment to see if anything can be done about her restless legs and lack of sleep since it impacts her mood and everything she does.   Follow up with Ms. Carreon today reporting her daughter was hospitalized this morning for a heart condition and Ms. M is worried about her.  She states she has had a  few bad days herself and saw the Dr. Wilburn Mylar.  He prescribed something for her "restless legs" and she took one last night.  She stated it made her feel weird, but she did sleep better.  Her mood continues to be stable but she is worried about her daughter and concerned whether this new medication will alleviate any of her discomfort.  Counselor will continue to follow with her.   [name] expressed some interest in finding other patients with the same diagnosis to talk about their joint problems.  I shared her information with nurses over [facility] to share with their patients under the same diagnosis if they are interested.  Counselor follow up today with Ms. Jerilynn Mages stating she continues to struggle with breathing and coughing quite a bit.  She sees the pulmonary Dr. next week and is hoping for some help at that time.  She does report that her goal to stop losing weight has been successful since starting the protein shakes that the dietician here recommended.  Ms. Jerilynn Mages states she sleeps okay and that her restless legs have been treated with the new medication given by her Dr.  Minus Liberty will continue to follow with her while in this program.       Education: Education Goals: Education classes will be provided on a weekly basis, covering required topics. Participant will state understanding/return demonstration of topics presented.  Learning Barriers/Preferences:     Learning Barriers/Preferences - 06/30/15 0915      Learning Barriers/Preferences   Learning Barriers None   Learning Preferences Group Instruction;Individual Instruction;Pictoral;Skilled Demonstration;Verbal Instruction;Video;Written Material      Education Topics: Initial Evaluation Education: - Verbal, written and demonstration of respiratory meds, RPE/PD scales, oximetry and breathing techniques. Instruction on use of nebulizers and MDIs: cleaning and proper use, rinsing mouth with steroid doses and importance of monitoring MDI  activations. Flowsheet Row Pulmonary Rehab from 09/09/2015 in [facility] Cardiac and Pulmonary Rehab  Date  06/30/15  Educator  LB  Instruction Review Code  2- meets goals/outcomes      General Nutrition Guidelines/Fats and Fiber: -Group instruction provided by verbal, written material, models and posters to present the general guidelines for heart healthy nutrition. Gives an explanation and review of dietary fats and fiber. Flowsheet Row Pulmonary Rehab from 09/09/2015 in [facility] Cardiac and Pulmonary Rehab  Date  08/24/15  Educator  CR  Instruction Review Code  2- meets goals/outcomes      Controlling Sodium/Reading Food Labels: -Group verbal and written material supporting the discussion of sodium use in heart healthy nutrition. Review and explanation with models, verbal and written materials for utilization of the food label. Flowsheet Row Pulmonary Rehab from 09/09/2015 in  [facility]  Date  07/20/15  Educator  CR  Instruction Review Code  2- meets goals/outcomes      Exercise Physiology & Risk Factors: - Group verbal and written instruction with models to review the exercise physiology of the cardiovascular system and associated critical values. Details cardiovascular disease risk factors and the goals associated with each risk factor.   Aerobic Exercise & Resistance Training: - Gives group verbal and written discussion on the health impact of inactivity. On the components of aerobic and resistive training programs and the benefits of this training and how to safely progress through these programs.   Flexibility, Balance, General Exercise Guidelines: - Provides group verbal and written instruction on the benefits of flexibility and balance training programs. Provides general exercise guidelines with specific guidelines to those with heart or lung disease. Demonstration and skill practice provided. Flowsheet Row Pulmonary Rehab from 09/09/2015 in [facility] Cardiac and  Pulmonary Rehab  Date  08/26/15  Educator  AS  Instruction Review Code  2- meets goals/outcomes      Stress Management: - Provides group verbal and written instruction about the health risks of elevated stress, cause of high stress, and healthy ways to reduce stress.   Depression: - Provides group verbal and written instruction on the correlation between heart/lung disease and depressed mood, treatment options, and the stigmas associated with seeking treatment. Flowsheet Row Pulmonary Rehab from 09/09/2015 in [facility] Cardiac and Pulmonary Rehab  Date  07/15/15  Educator  [facility]  Instruction Review Code  2- meets goals/outcomes      Exercise & Equipment Safety: - Individual verbal instruction and demonstration of equipment use and safety with use of the equipment.   Infection Prevention: - Provides verbal and written material to individual with discussion of infection control including proper hand washing and proper equipment cleaning during exercise session. Flowsheet Row Pulmonary Rehab from 09/09/2015 in [facility] Cardiac and Pulmonary Rehab  Date  06/30/15  Educator  LB  Instruction Review Code  2- meets goals/outcomes      Falls Prevention: - Provides verbal and written material to individual with discussion of falls prevention and safety. Flowsheet Row Pulmonary Rehab from 09/09/2015 in [facility] Cardiac and Pulmonary Rehab  Date  06/30/15  Educator  LB  Instruction Review Code  2- meets goals/outcomes      Diabetes: - Individual verbal and written instruction to review signs/symptoms of diabetes, desired ranges of glucose level fasting, after meals and with exercise. Advice that pre and post exercise glucose checks will be done for 3 sessions at entry of program.   Chronic Lung Diseases: - Group verbal and written instruction to review new updates, new respiratory medications, new advancements in procedures and treatments. Provide informative websites and "800" numbers of  self-education. Flowsheet Row Pulmonary Rehab from 09/09/2015 in [facility] Cardiac and Pulmonary Rehab  Date  08/17/15  Educator  LB  Instruction Review Code  2- meets goals/outcomes      Lung Procedures: - Group verbal and written instruction to describe testing methods done to diagnose lung disease. Review the outcome of test results. Describe the treatment choices: Pulmonary Function Tests, ABGs and oximetry.   Energy Conservation: - Provide group verbal and written instruction for methods to conserve energy, plan and organize activities. Instruct on pacing techniques, use of adaptive equipment and posture/positioning to relieve shortness of breath.   Triggers: - Group verbal and written instruction to review types of environmental controls: home humidity, furnaces, filters, dust mite/pet prevention, HEPA  vacuums. To discuss weather changes, air quality and the benefits of nasal washing.   Exacerbations: - Group verbal and written instruction to provide: warning signs, infection symptoms, calling MD promptly, preventive modes, and value of vaccinations. Review: effective airway clearance, coughing and/or vibration techniques. Create an Sports administrator. Flowsheet Row Pulmonary Rehab from 09/09/2015 in [facility] Cardiac and Pulmonary Rehab  Date  07/27/15  Educator  LB  Instruction Review Code  2- meets goals/outcomes      Oxygen: - Individual and group verbal and written instruction on oxygen therapy. Includes supplement oxygen, available portable oxygen systems, continuous and intermittent flow rates, oxygen safety, concentrators, and Medicare reimbursement for oxygen.   Respiratory Medications: - Group verbal and written instruction to review medications for lung disease. Drug class, frequency, complications, importance of spacers, rinsing mouth after steroid MDI's, and proper cleaning methods for nebulizers. Flowsheet Row Pulmonary Rehab from 09/09/2015 in [facility] Cardiac and Pulmonary Rehab  Date   06/30/15  Educator  LB  Instruction Review Code  2- meets goals/outcomes      AED/CPR: - Group verbal and written instruction with the use of models to demonstrate the basic use of the AED with the basic ABC's of resuscitation. Flowsheet Row Pulmonary Rehab from 09/09/2015 in [facility] Cardiac and Pulmonary Rehab  Date  09/04/15  Educator  CE  Instruction Review Code  2- meets goals/outcomes      Breathing Retraining: - Provides individuals verbal and written instruction on purpose, frequency, and proper technique of diaphragmatic breathing and pursed-lipped breathing. Applies individual practice skills. Flowsheet Row Pulmonary Rehab from 09/09/2015 in [facility] Cardiac and Pulmonary Rehab  Date  06/30/15  Educator  LB  Instruction Review Code  2- meets goals/outcomes      Anatomy and Physiology of the Lungs: - Group verbal and written instruction with the use of models to provide basic lung anatomy and physiology related to function, structure and complications of lung disease. Flowsheet Row Pulmonary Rehab from 09/09/2015 in [facility] Cardiac and Pulmonary Rehab  Date  07/17/15  Educator  SJ  Instruction Review Code  2- meets goals/outcomes      Heart Failure: - Group verbal and written instruction on the basics of heart failure: signs/symptoms, treatments, explanation of ejection fraction, enlarged heart and cardiomyopathy.   Sleep Apnea: - Individual verbal and written instruction to review Obstructive Sleep Apnea. Review of risk factors, methods for diagnosing and types of masks and machines for OSA.   Anxiety: - Provides group, verbal and written instruction on the correlation between heart/lung disease and anxiety, treatment options, and management of anxiety. Flowsheet Row Pulmonary Rehab from 09/09/2015 in [facility] Cardiac and Pulmonary Rehab  Date  08/12/15  Educator  [facility]  Instruction Review Code  2- Meets goals/outcomes      Relaxation: - Provides group, verbal and written  instruction about the benefits of relaxation for patients with heart/lung disease. Also provides patients with examples of relaxation techniques. Flowsheet Row Pulmonary Rehab from 09/09/2015 in [facility] Cardiac and Pulmonary Rehab  Date  09/09/15  Educator  [facility]  Instruction Review Code  2- Meets goals/outcomes      Knowledge Questionnaire Score:     Knowledge Questionnaire Score - 06/30/15 0915      Knowledge Questionnaire Score   Pre Score 9/10       Core Components/Risk Factors/Patient Goals at Admission:     Personal Goals and Risk Factors at Admission - 06/30/15 1157      Core Components/Risk Factors/Patient Goals on Admission  Weight Management --  Ms Schobert prefers not to meet with the dietitian. She has lost 20lbs due to her bronchiectasis. Supplements, such as boost, Ensure make her have diarrhea. She cooks healthy and does eat out occasionally.      Core Components/Risk Factors/Patient Goals Review:      Goals and Risk Factor Review    Row Name 07/06/15 1000 07/08/15 1251 07/17/15 1234 07/20/15 1608 07/24/15 1235     Core Components/Risk Factors/Patient Goals Review   Personal Goals Review Develop more efficient breathing techniques such as purse lipped breathing and diaphragmatic breathing and practicing self-pacing with activity.  - Increase knowledge of respiratory medications and ability to use respiratory devices properly. Increase knowledge of respiratory medications and ability to use respiratory devices properly. Develop more efficient breathing techniques such as purse lipped breathing and diaphragmatic breathing and practicing self-pacing with activity.   Review Reviewed PLB technique; good demonstration of PLB and used it with her exercise goals Ms Alois Cliche arrived very shor of breath. We discussed pacing herself and sing PLB walking to class. After she rested, she did very good with her exercise goals. She is now 108.9lbx and has a very poor appetitie. She  would now like to meet with the dietitian  for options to help her gain weight; even drinks like Boost upset her stomach. Ms. Rockhold was given a flutter valve today to use for her secreations.  She demonstrated proper technique when using the flutter valve.  Followed up on Ms Baxley and the flutter valve. She is using it, but has no had trouble bringing up her secretions. Using her SVN when she is wheezy. Keeps it clean with soapy water and once a week with vinigar. I reviewed PLB with Ms. Nordmeyer.  She demonstrates proper technique and knows when to use it.   Expected Outcomes Continue using PLB with exercise goals and activity at home.  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 Continue using Flutter Valve and SVN to manage her secretions and wheezing episodes. PLB will help her with SOB while exercising and with ADLs.  This will help her to be able to do more in both area of her life.    [place] Name 07/24/15 1236 08/03/15 1618 08/17/15 1528 08/19/15 1158 08/19/15 1331     Core Components/Risk Factors/Patient Goals Review   Personal Goals Review Improve shortness of breath with ADL's Sedentary;Increase knowledge of respiratory medications and ability to use respiratory devices properly. Increase Strength and Stamina Increase knowledge of respiratory medications and ability to use respiratory devices properly. Weight Management/Obesity   Review Talked with Ms. MsAlister about a chest vest.  She has an extreme about of mucous that is causing continious coughing spells.  I told her to speak with her physician about  aquiring one if possible. Ms Seto uses her SVN when she feels tight chested and wheezing. She has a good understanding of albuterol and cleaning her nebulizer. She also walked on her treadmill at home for 5 minutes and plans to increase her time on it on off days from  [place]. Ms Grossberg improved her Mid 76md by 1198f  Minimal Importance difference with COPD is [id number] she has Bronchectasis, this feet improvement is comparible. Discussed using the medication, theophylline. She does have an appointment with Dr FulCyndie Chimeon and can question about this medication. [name] is trying to gain weight. Some days she finds it hard to heat because of her  cough and breathing concerns.    Expected Outcomes Hopefully the chest vest will help manage her secreations and help improve her SOB and ADLs.  Continue exercising on home TM - increasing her time gradually.  - Adding theophylline possibly  to improve her breathing. [name] will use her good breathing days to eat more calories to aid in her weight gain goal. To see weight gain or maintenance during program.   Row Name 08/24/15 1151 08/24/15 1508 09/11/15 1349         Core Components/Risk Factors/Patient Goals Review   Personal Goals Review Sedentary;Improve shortness of breath with ADL's;Increase Strength and Stamina Improve shortness of breath with ADL's;Develop more efficient breathing techniques such as purse lipped breathing and diaphragmatic breathing and practicing self-pacing with activity. Sedentary;Improve shortness of breath with ADL's     Review [name] has not been doing much at home recently due to the weather.  She does note that she is feeling better with more strength and stamina on more days. Ms Lamarca has managed her bronchectasis for many years. She has good days and bad days with her breathing and manages her breathing with PLB, pacing ,and limiting her activity as needed. she knows when to say "no" . Ms Lingelbach will be graduating soon, so I followed up with her on exercise plans after [facility]. She [place] with several of her relatives on the same property with many walking trails. She plans to walk on the trails with the use of a walker/seat or her husband follow her with his golf cart. Coming to [facility] has been good for her, but it is so  tiring and hard on her breathing. She woud rather exercise at her home.     Expected Outcomes We discussed home exercise guidelines today to help boost her stamina at home.  She will continue to come to class for exercise as well.  -  -        Core Components/Risk Factors/Patient Goals at Discharge (Final Review):      Goals and Risk Factor Review - 09/11/15 1349      Core Components/Risk Factors/Patient Goals Review   Personal Goals Review Sedentary;Improve shortness of breath with ADL's   Review Ms Gettinger will be graduating soon, so I followed up with her on exercise plans after [facility]. She [place] with several of her relatives on the same property with many walking trails. She plans to walk on the trails with the use of a walker/seat or her husband follow her with his golf cart. Coming to [facility] has been good for her, but it is so tiring and hard on her breathing. She woud rather exercise at her home.      ITP Comments:     ITP Comments    Row Name 07/10/15 1121 07/15/15 1128 08/07/15 1049 08/10/15 1118 08/19/15 1156   ITP Comments Today [name] complained of feeling dizzy after she moved from the NuStep to the treadmill. She sat down and rested for a few minutes.  BP check revealed 88/60. Skin warm and dry.   Given 2 full cups of water before she felt better.  BP reading after the water 124/76. She resumed her exercise and no more concerns occurred.  Talked to her about staying hydrated before, during andafter class.  She voiced understanding.  [name] was concerned about her BP being low and has had times at home she felt dizzy.  I recommended she call her Dr. to discuss symptoms  and meds. Maddyn has a chronic cough that her MD is treating but continues to do well exercising in [facility].  Educated on A-fib by Carson Myrtle (respiratory therapist). Discussed what to do when it occurs (rest) and medication managment. Will be bringing the name of her A-fib med next session to  update our med list.  Ms Teigen is having a bad breathing day, as well as yesterday. I talked with her about taking theophylline and recommended to talk to Dr Cyndie Chime about this treatment. She was too short of breath to exercise today and left early from [place].    [place] Name 09/14/15 1101           ITP Comments Ms Sowle has an appointment with her pulmonologist on 09/15/15. Updated ITP and workloads for patient to review with doctor.           Comments: Updated ITP created for patient to review with doctor.

## 2015-09-16 ENCOUNTER — Encounter: Payer: Medicare Other | Admitting: *Deleted

## 2015-09-16 DIAGNOSIS — J479 Bronchiectasis, uncomplicated: Secondary | ICD-10-CM | POA: Diagnosis not present

## 2015-09-16 NOTE — Progress Notes (Signed)
Daily Session Note  Patient Details  Name: Ana Washington MRN: [id number] Date of Birth: [birth year] Referring Provider:   [name] Pulmonary Rehab from 07/08/2015 in [facility] Cardiac and Pulmonary Rehab  Referring Provider  [name]      Encounter Date: 09/16/2015  Check In:     Session Check In - 09/16/15 1015      Check-In   Location [facility]-Cardiac & Pulmonary Rehab   Staff Present Alberteen Sam, MA, ACSM RCEP, Exercise Physiologist;Amanda Oletta Darter, BA, ACSM CEP, Exercise Physiologist;Laureen Owens Shark, BS, RRT, Respiratory Therapist   Supervising physician immediately available to respond to emergencies [facility] immediately available ER MD   Physician(s) Drs. Edd Fabian and McShane   Medication changes reported     No   Fall or balance concerns reported    No   Warm-up and Cool-down Performed as group-led Location manager Performed Yes   VAD Patient? No     VAD patient   Has back up controller? No     Pain Assessment   Currently in Pain? No/denies   Multiple Pain Sites No         Goals Met:  Proper associated with RPD/PD & O2 Sat Independence with exercise equipment Using PLB without cueing & demonstrates good technique Exercise tolerated well Strength training completed today  Goals Unmet:  Not Applicable  Comments: Pt able to follow exercise prescription today without complaint.  Will continue to monitor for progression.    Dr. Emily Filbert is Medical Director for [place] and [facility] Pulmonary Rehabilitation.

## 2015-09-18 DIAGNOSIS — J479 Bronchiectasis, uncomplicated: Secondary | ICD-10-CM | POA: Diagnosis not present

## 2015-09-18 NOTE — Progress Notes (Signed)
Daily Session Note  Patient Details  Name: Ana Washington MRN: [id number] Date of Birth: 07/23/1945 Referring Provider:   [name] Pulmonary Rehab from 07/08/2015 in [facility] Cardiac and Pulmonary Rehab  Referring Provider  [name]      Encounter Date: 09/18/2015  Check In:     Session Check In - 09/18/15 1048      Check-In   Location [facility]-Cardiac & Pulmonary Rehab   Staff Present Gerlene Burdock, RN, BSN;Stacey Blanch Media, RRT, RCP, Respiratory Dareen Piano, BA, ACSM CEP, Exercise Physiologist   Supervising physician immediately available to respond to emergencies [facility] immediately available ER MD   Physician(s) Alfred Levins and Jimmye Norman   Medication changes reported     No   Fall or balance concerns reported    No   Warm-up and Cool-down Performed as group-led instruction   Resistance Training Performed No   VAD Patient? No     Pain Assessment   Currently in Pain? No/denies         Goals Met:  Proper associated with RPD/PD & O2 Sat Independence with exercise equipment Exercise tolerated well Strength training completed today  Goals Unmet:  Not Applicable  Comments: Pt able to follow exercise prescription today without complaint.  Will continue to monitor for progression.    Dr. Emily Filbert is Medical Director for [place] and [facility] Pulmonary Rehabilitation.

## 2015-09-21 ENCOUNTER — Encounter: Payer: Medicare Other | Admitting: *Deleted

## 2015-09-21 DIAGNOSIS — J479 Bronchiectasis, uncomplicated: Secondary | ICD-10-CM | POA: Diagnosis not present

## 2015-09-21 NOTE — Progress Notes (Signed)
Daily Session Note  Patient Details  Name: Ana Washington MRN: [id number] Date of Birth: 1945-07-01 Referring Provider:   [name] Pulmonary Rehab from 07/08/2015 in [facility] Cardiac and Pulmonary Rehab  Referring Provider  [name]      Encounter Date: 09/21/2015  Check In:     Session Check In - 09/21/15 1059      Check-In   Location [facility]-Cardiac & Pulmonary Rehab   Staff Present Carson Myrtle, BS, RRT, Respiratory Therapist;Symphony Demuro Amedeo Plenty, BS, ACSM CEP, Exercise Physiologist;Jessica Luan Pulling, MA, ACSM RCEP, Exercise Physiologist   Supervising physician immediately available to respond to emergencies [facility] immediately available ER MD   Physician(s) Alfred Levins and Jimmye Norman    Medication changes reported     No   Fall or balance concerns reported    No   Warm-up and Cool-down Performed on first and last piece of equipment   Resistance Training Performed Yes   VAD Patient? No     VAD patient   Has back up controller? No     Pain Assessment   Currently in Pain? No/denies   Multiple Pain Sites No         Goals Met:  Proper associated with RPD/PD & O2 Sat Independence with exercise equipment Exercise tolerated well Strength training completed today  Goals Unmet:  Not Applicable  Comments: Pt able to follow exercise prescription today without complaint.  Will continue to monitor for progression.    Dr. Emily Filbert is Medical Director for [place] and [facility] Pulmonary Rehabilitation.

## 2015-09-22 NOTE — Progress Notes (Signed)
Ana Washington 70 y.o. female   Patient psychosocial assessment reveals no barriers to participation in Pulmonary Rehab. Psychosocial areas that are currently affecting patient's rehab experience include concerns about her husband who has his own medical problems and can not help her with activiites around the house. Patient does continue to exhibit positive coping skills to deal with her psychosocial concerns. Ana Washington has been offered emotional support and reassurance from her church and will be receiving 2 meals a week and 1 day of house care from church members.. Patient does feel she is making progress toward Pulmonary Rehab goals. Patient reports her health and activity level has improved since participating in [place] as evidenced by patient's report of increased ability to exercise and be more active at home. Patient states family/friends have noticed changes in her activity or mood. After reviewing the patient's treatment plan, the patient is making progress toward Pulmonary Rehab goals. Ana Washington will be graduating soon and knows she needs to continue exercising and increasing her weight to improve her quality of life with her bronchiectasis. Goal(s) after graduation from [place]: Improved management of stress  Improved coping skills Help Ana Washington work toward returning to meaningful activities that improve patient's QOL and are attainable with patient's lung disease

## 2015-09-23 DIAGNOSIS — J479 Bronchiectasis, uncomplicated: Secondary | ICD-10-CM

## 2015-09-23 NOTE — Progress Notes (Signed)
Daily Session Note  Patient Details  Name: Ana Washington MRN: [id number] Date of Birth: 09/06/1945 Referring Provider:   [name] Pulmonary Rehab from 07/08/2015 in [facility] Cardiac and Pulmonary Rehab  Referring Provider  [name]      Encounter Date: 09/23/2015  Check In:     Session Check In - 09/23/15 1327      Check-In   Location [facility]-Cardiac & Pulmonary Rehab   Staff Present Nyoka Cowden, RN, BSN, Kela Millin, BA, ACSM CEP, Exercise Physiologist;Laureen Janell Quiet, RRT, Respiratory Therapist   Supervising physician immediately available to respond to emergencies [facility] immediately available ER MD   Physician(s) Alfred Levins and Jimmye Norman   Medication changes reported     No   Fall or balance concerns reported    No   Warm-up and Cool-down Performed as group-led Location manager Performed Yes   VAD Patient? No     VAD patient   Has back up controller? No     Pain Assessment   Currently in Pain? No/denies         Goals Met:  Proper associated with RPD/PD & O2 Sat Independence with exercise equipment Exercise tolerated well Strength training completed today  Goals Unmet:  Not Applicable  Comments:      [name] Name 06/30/15 1128 08/17/15 1042 09/23/15 1327     6 Minute Walk   Phase  - Mid Program Discharge   Distance 912 feet 1025 feet 900 feet   Distance % Change  - 12 %  -   Walk Time 4.73 minutes 6 minutes 5 minutes   # of Rest Breaks 1  rest 1:16  1  Rested due to SOB 1   MPH 2.19 1.94 2.04   METS 1.5 2.49 2.6   RPE '15 13 13   '$ Perceived Dyspnea  '7 4 5   '$ VO2 Peak  - 8.71 9.02   Symptoms Yes (comment) Yes (comment) No   Comments shortness of breath Shortness of breath-rested then resumed walk  -   Resting HR 80 bpm 79 bpm 81 bpm   Resting BP 120/80 118/60 110/66   Max Ex. HR 114 bpm 105 bpm 96 bpm   Max Ex. BP 158/90 132/82 120/66   2 Minute Post BP 126/78 122/70  -     Interval HR   Baseline  HR 80 79  -   1 Minute HR 98 98  -   2 Minute HR 104 105 92   3 Minute HR 111 98 96   4 Minute HR  - 92  -   5 Minute HR 114 98  -   6 Minute HR 114 98 94   2 Minute Post HR 88 85  -   Interval Heart Rate? Yes Yes  -     Interval Oxygen   Interval Oxygen? Yes Yes  -   Baseline Oxygen Saturation % 96 % 97 %  -   Baseline Liters of Oxygen  - 0 L  -   1 Minute Oxygen Saturation % 97 % 94 %  -   1 Minute Liters of Oxygen  - 0 L  -   2 Minute Oxygen Saturation % 93 % 96 % 90 %   2 Minute Liters of Oxygen  - 0 L  -   3 Minute Oxygen Saturation % 93 % 93 % 92 %   3 Minute Liters of Oxygen  -  0 L  -   4 Minute Oxygen Saturation %  - 97 %  -   4 Minute Liters of Oxygen  - 0 L  -   5 Minute Oxygen Saturation % 91 % 95 %  -   5 Minute Liters of Oxygen  - 0 L  -   6 Minute Oxygen Saturation % 93 % 93 % 93 %   6 Minute Liters of Oxygen  - 0 L  -   2 Minute Post Oxygen Saturation % 98 % 98 %  -   2 Minute Post Liters of Oxygen  - 0 L  -        Dr. Emily Filbert is Medical Director for [place] and [facility].

## 2015-09-25 ENCOUNTER — Encounter: Payer: Medicare Other | Admitting: *Deleted

## 2015-09-25 DIAGNOSIS — J479 Bronchiectasis, uncomplicated: Secondary | ICD-10-CM | POA: Diagnosis not present

## 2015-09-25 NOTE — Progress Notes (Signed)
Daily Session Note  Patient Details  Name: Ana Washington MRN: [id number] Date of Birth: 11/12/1945 Referring Provider:   [name] Pulmonary Rehab from 07/08/2015 in [facility] Cardiac and Pulmonary Rehab  Referring Provider  [name]      Encounter Date: 09/25/2015  Check In:     Session Check In - 09/25/15 1201      Check-In   Staff Present Heath Lark, RN, BSN, CCRP;Mary Kellie Shropshire, RN, BSN, Cathlean Cower, RRT, RCP, Respiratory Therapist   Supervising physician immediately available to respond to emergencies [facility] immediately available ER MD   Physician(s) Drs: Clearnce Hasten and Archie Balboa   Medication changes reported     No   Fall or balance concerns reported    No   Warm-up and Cool-down Performed as group-led instruction   Resistance Training Performed Yes   VAD Patient? No     VAD patient   Has back up controller? No     Pain Assessment   Currently in Pain? No/denies         Goals Met:  Proper associated with RPD/PD & O2 Sat Independence with exercise equipment Exercise tolerated well Strength training completed today  Goals Unmet:  Not Applicable  Comments: Doing well with exercise prescription progression.    Dr. Emily Filbert is Medical Director for [place] and [facility].

## 2015-09-28 ENCOUNTER — Encounter: Payer: Medicare Other | Admitting: *Deleted

## 2015-09-28 DIAGNOSIS — J479 Bronchiectasis, uncomplicated: Secondary | ICD-10-CM

## 2015-09-28 NOTE — Progress Notes (Signed)
Daily Session Note  Patient Details  Name: Ana Washington MRN: [id number] Date of Birth: 07/18/1945 Referring Provider:   [name] Pulmonary Rehab from 07/08/2015 in [facility] Cardiac and Pulmonary Rehab  Referring Provider  [name]      Encounter Date: 09/28/2015  Check In:     Session Check In - 09/28/15 1016      Check-In   Location [facility]-Cardiac & Pulmonary Rehab   Staff Present Alberteen Sam, MA, ACSM RCEP, Exercise Physiologist;Kelly Amedeo Plenty, BS, ACSM CEP, Exercise Physiologist;Laureen Owens Shark, BS, RRT, Respiratory Therapist   Supervising physician immediately available to respond to emergencies [facility] immediately available ER MD   Physician(s) Drs. Edd Fabian and Yao   Medication changes reported     No   Fall or balance concerns reported    No   Warm-up and Cool-down Performed as group-led Location manager Performed Yes   VAD Patient? No     VAD patient   Has back up controller? No     Pain Assessment   Currently in Pain? No/denies   Multiple Pain Sites No         Goals Met:  Proper associated with RPD/PD & O2 Sat Independence with exercise equipment Using PLB without cueing & demonstrates good technique Exercise tolerated well Strength training completed today  Goals Unmet:  Not Applicable  Comments: Pt able to follow exercise prescription today without complaint.  Will continue to monitor for progression.    Dr. Emily Filbert is Medical Director for [place] and [facility].

## 2015-09-30 ENCOUNTER — Encounter: Payer: Medicare Other | Admitting: *Deleted

## 2015-09-30 DIAGNOSIS — J479 Bronchiectasis, uncomplicated: Secondary | ICD-10-CM

## 2015-09-30 NOTE — Patient Instructions (Signed)
Discharge Instructions  Patient Details  Name: Ana Washington MRN: [id number] Date of Birth: 1945-05-19 Referring Provider:  [name], MD   Number of Visits: 36  Reason for Discharge:  Patient reached a stable level of exercise. Patient independent in their exercise.  Smoking History:  History  Smoking Status  . Never Smoker  Smokeless Tobacco  . Not on file    Diagnosis:  Bronchiectasis without complication [facility])  Initial Exercise Prescription:     Initial Exercise Prescription - 07/08/15 1400      Date of Initial Exercise RX and Referring Provider   Referring Provider Ana Washington      Discharge Exercise Prescription (Final Exercise Prescription Changes):     Exercise Prescription Changes - 09/30/15 1400      Exercise Review   Progression Yes     Response to Exercise   Blood Pressure (Admit) 118/68   Blood Pressure (Exercise) 130/72   Blood Pressure (Exit) 114/60   Heart Rate (Admit) 82 bpm   Heart Rate (Exercise) 110 bpm   Heart Rate (Exit) 88 bpm   Oxygen Saturation (Admit) 93 %   Oxygen Saturation (Exercise) 92 %   Oxygen Saturation (Exit) 96 %   Rating of Perceived Exertion (Exercise) 13   Perceived Dyspnea (Exercise) 4   Symptoms None   Comments Home Exercise Guidelines given 08/24/15   Duration Progress to 45 minutes of aerobic exercise without signs/symptoms of physical distress   Intensity THRR unchanged     Progression   Progression Continue to progress workloads to maintain intensity without signs/symptoms of physical distress.   Average METs 3.33     Resistance Training   Training Prescription Yes   Weight 3 lbs   Reps 10-15     Interval Training   Interval Training No     Treadmill   MPH 2.2   Grade 1   Minutes 15   METs 2.99     NuStep   Level 4   Watts --  67 spm   Minutes 15   METs 3.5     REL-XR   Level 4   Minutes 15   METs 3.5     Home Exercise Plan   Plans to continue exercise at Home  treadmill   Frequency Add 2 additional days to program exercise sessions.      Functional Capacity:     6 Minute Walk    [place] Name 06/30/15 1128 08/17/15 1042 09/23/15 1327     6 Minute Walk   Phase  - Mid Program Discharge   Distance 912 feet 1025 feet 900 feet   Distance % Change  - 12 %  -   Walk Time 4.73 minutes 6 minutes 5 minutes   # of Rest Breaks 1  rest 1:16  1  Rested due to SOB 1   MPH 2.19 1.94 2.04   METS 1.5 2.49 2.6   RPE 15 13 13    Perceived Dyspnea  7 4 5    VO2 Peak  - 8.71 9.02   Symptoms Yes (comment) Yes (comment) No   Comments shortness of breath Shortness of breath-rested then resumed walk  -   Resting HR 80 bpm 79 bpm 81 bpm   Resting BP 120/80 118/60 110/66   Max Ex. HR 114 bpm 105 bpm 96 bpm   Max Ex. BP 158/90 132/82 120/66   2 Minute Post BP 126/78 122/70  -     Interval HR   Baseline HR 80  79  -   1 Minute HR 98 98  -   2 Minute HR 104 105 92   3 Minute HR 111 98 96   4 Minute HR  - 92  -   5 Minute HR 114 98  -   6 Minute HR 114 98 94   2 Minute Post HR 88 85  -   Interval Heart Rate? Yes Yes  -     Interval Oxygen   Interval Oxygen? Yes Yes  -   Baseline Oxygen Saturation % 96 % 97 %  -   Baseline Liters of Oxygen  - 0 L  -   1 Minute Oxygen Saturation % 97 % 94 %  -   1 Minute Liters of Oxygen  - 0 L  -   2 Minute Oxygen Saturation % 93 % 96 % 90 %   2 Minute Liters of Oxygen  - 0 L  -   3 Minute Oxygen Saturation % 93 % 93 % 92 %   3 Minute Liters of Oxygen  - 0 L  -   4 Minute Oxygen Saturation %  - 97 %  -   4 Minute Liters of Oxygen  - 0 L  -   5 Minute Oxygen Saturation % 91 % 95 %  -   5 Minute Liters of Oxygen  - 0 L  -   6 Minute Oxygen Saturation % 93 % 93 % 93 %   6 Minute Liters of Oxygen  - 0 L  -   2 Minute Post Oxygen Saturation % 98 % 98 %  -   2 Minute Post Liters of Oxygen  - 0 L  -      Quality of Life:     Quality of Life - 09/28/15 0750      Quality of Life Scores   Health/Function Pre 15.75 %    Health/Function Post 21 %   Health/Function % Change 33.33 %   Socioeconomic Pre 30 %   Socioeconomic Post 21 %   Socioeconomic % Change  -30 %   Psych/Spiritual Pre 20.57 %   Psych/Spiritual Post 21 %   Psych/Spiritual % Change 2.09 %   Family Pre 26.4 %   Family Post 21 %   Family % Change -20.45 %   GLOBAL Pre 15.75 %   GLOBAL Post 21 %   GLOBAL % Change 33.33 %      Personal Goals: Goals established at orientation with interventions provided to work toward goal.     Personal Goals and Risk Factors at Admission - 06/30/15 1157      Core Components/Risk Factors/Patient Goals on Admission    Weight Management --  Ana Washington prefers not to meet with the dietitian. She has lost 20lbs due to her bronchiectasis. Supplements, such as boost, Ensure make her have diarrhea. She cooks healthy and does eat out occasionally.       Personal Goals Discharge:     Goals and Risk Factor Review - 09/22/15 0827      Core Components/Risk Factors/Patient Goals Review   Review Continue exercising at her nephew's gym, increase her food intake, and continue managing her bronchiectasis through her therapies      Nutrition & Weight - Outcomes:    Nutrition:     Nutrition Therapy & Goals - 07/23/15 1430      Nutrition Therapy   Diet Instructed patient on steps to take to increase calories  to prevent further weight loss and to promote weight gain.   Patient has lost approximately 30 lbs since 12/2014.   Protein (specify units) Minimum goal of 60 gms per day. Stressed importance of adequate protein for immune system and to maintain muscle strength.   Fiber 25 grams   Whole Grain Foods 3 servings   Saturated Fats 12 max. grams   Fruits and Vegetables 5 servings/day   Sodium 2000 grams     Personal Nutrition Goals   Personal Goal #1 Include at least 4 oz. of a protein food daily (refer to list). Supplement protein with 11 oz. of Premier protein shake split into 3 oz. servings 3-4  x/day so as to not overwhelm at one time.   Personal Goal #2 Add butter/margarine, mayonnaise or oil to any appropriate foods as discussed.   Personal Goal #3 Try to include 1 oz equivalent of protein with each meal.    Personal Goal #4 Eat 5-6 small meals/snacks per day when ever possible.   Comments Main goal presently is to prevent further weight loss and to promote weight gain.     Intervention Plan   Intervention Prescribe, educate and counsel regarding individualized specific dietary modifications aiming towards targeted core components such as weight, hypertension, lipid management, diabetes, heart failure and other comorbidities.;Nutrition handout(s) given to patient.   Expected Outcomes Short Term Goal: A plan has been developed with personal nutrition goals set during dietitian appointment.;Long Term Goal: Adherence to prescribed nutrition plan.      Nutrition Discharge:   Education Questionnaire Score:     Knowledge Questionnaire Score - 09/28/15 0750      Knowledge Questionnaire Score   Post Score 10/10      Goals reviewed with patient; copy given to patient.

## 2015-09-30 NOTE — Progress Notes (Signed)
Daily Session Note  Patient Details  Name: Ana Washington MRN: [id number] Date of Birth: [birth year] Referring Provider:   [name] Pulmonary Rehab from 07/08/2015 in [facility] Cardiac and Pulmonary Rehab  Referring Provider  [name]      Encounter Date: 09/30/2015  Check In:     Session Check In - 09/30/15 1424      Check-In   Location [facility]-Cardiac & Pulmonary Rehab   Staff Present Nada Maclachlan, BA, ACSM CEP, Exercise Physiologist;Mary Kellie Shropshire, RN, BSN, [place], BS, RRT, Respiratory Therapist   Supervising physician immediately available to respond to emergencies [facility] immediately available ER MD   Physician(s) Drs. Karma Greaser and Alfred Levins   Medication changes reported     No   Fall or balance concerns reported    No   Warm-up and Cool-down Performed as group-led Location manager Performed Yes   VAD Patient? No     VAD patient   Has back up controller? No     Pain Assessment   Currently in Pain? No/denies   Multiple Pain Sites No         Goals Met:  Proper associated with RPD/PD & O2 Sat Independence with exercise equipment Using PLB without cueing & demonstrates good technique Exercise tolerated well Strength training completed today  Goals Unmet:  Not Applicable  Comments: Pt able to follow exercise prescription today without complaint.  Will continue to monitor for progression.    Dr. Emily Filbert is Medical Director for [place] and [facility].

## 2015-10-02 ENCOUNTER — Encounter: Payer: Medicare Other | Attending: Family Medicine | Admitting: Respiratory Therapy

## 2015-10-02 DIAGNOSIS — J479 Bronchiectasis, uncomplicated: Secondary | ICD-10-CM | POA: Diagnosis present

## 2015-10-02 NOTE — Progress Notes (Signed)
Cardiac Individual Treatment Plan  Patient Details  Name: Ana Washington MRN: [id number] Date of Birth: 09/13/1945 Referring Provider:   [name] Pulmonary Rehab from 07/08/2015 in [facility] Cardiac and Pulmonary Rehab  Referring Provider  [name]      Initial Encounter Date:  Flowsheet Row Pulmonary Rehab from 07/08/2015 in [facility] Cardiac and Pulmonary Rehab  Referring Provider  [name]      Visit Diagnosis: Bronchiectasis without complication ([place])  Patient's Home Medications on Admission:  Current Outpatient Prescriptions:  .  alendronate (FOSAMAX) 70 MG tablet, take 1 tablet by mouth every week, Disp: , Rfl:  .  amLODipine (NORVASC) 5 MG tablet, take 1 tablet by mouth once daily, Disp: , Rfl:  .  azithromycin (ZITHROMAX) 250 MG tablet, Take by mouth., Disp: , Rfl:  .  Calcium Carbonate-Vitamin D (OYSTER SHELL/VITAMIN D) 600-125 MG-UNIT TABS, Take by mouth., Disp: , Rfl:  .  Cholecalciferol (VITAMIN D-1000 MAX ST) 1000 UNITS tablet, Take by mouth., Disp: , Rfl:  .  ciprofloxacin (CIPRO) 500 MG tablet, Take by mouth., Disp: , Rfl:  .  diclofenac sodium (VOLTAREN) 1 % GEL, Apply 2 g topically 4 (four) times daily., Disp: 100 g, Rfl: 1 .  fluocinonide (LIDEX) 0.05 % external solution, prn as needed, Disp: , Rfl:  .  fluticasone (FLONASE) 50 MCG/ACT nasal spray, Place into the nose., Disp: , Rfl:  .  hydrochlorothiazide (MICROZIDE) 12.5 MG capsule, take 1 capsule by mouth once daily, Disp: , Rfl:  .  ketoconazole (NIZORAL) 2 % shampoo, Apply topically., Disp: , Rfl:  .  losartan (COZAAR) 100 MG tablet, take 1 tablet by mouth once daily, Disp: , Rfl:  .  metoprolol succinate (TOPROL-XL) 50 MG 24 hr tablet, Take by mouth., Disp: , Rfl:  .  omeprazole (PRILOSEC OTC) 20 MG tablet, Take by mouth., Disp: , Rfl:  .  rivaroxaban (XARELTO) 20 MG TABS tablet, Take by mouth., Disp: , Rfl:  .  triamcinolone cream (KENALOG) 0.1 %, prn as needed, Disp: , Rfl:   Past Medical  History: Past Medical History:  Diagnosis Date  . 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 Hypertension   . Lung disease     Tobacco Use: History  Smoking Status  . Never Smoker  Smokeless Tobacco  . Not on file    Labs: Recent Review Flowsheet Data    There is no flowsheet data to display.       Exercise Target Goals:    Exercise Program Goal: Individual exercise prescription set with THRR, safety & activity barriers. Participant demonstrates ability to understand and report RPE using BORG scale, to self-measure pulse accurately, and to acknowledge the importance of the exercise prescription.  Exercise Prescription Goal: Starting with aerobic activity 30 plus minutes a day, 3 days per week for initial exercise prescription. Provide home exercise prescription and guidelines that participant acknowledges understanding prior to discharge.  Activity Barriers & Risk Stratification:     Activity Barriers & Cardiac Risk Stratification - 06/30/15 0915      Activity Barriers & Cardiac Risk Stratification   Activity Barriers Shortness of Breath;Deconditioning;Muscular Weakness;Other (comment)   Comments Chronic cough   Cardiac Risk Stratification High      6 Minute Walk:     6 Minute Walk    Row Name 06/30/15 1128 08/17/15 1042 09/23/15 1327     6 Minute Walk   Phase  - Mid Program Discharge   Distance 912 feet 1025 feet 900 feet  Distance % Change  - 12 %  -   Walk Time 4.73 minutes 6 minutes 5 minutes   # of Rest Breaks 1  rest 1:16  1  Rested due to SOB 1   MPH 2.19 1.94 2.04   METS 1.5 2.49 2.6   RPE '15 13 13   '$ Perceived Dyspnea  '7 4 5   '$ VO2 Peak  - 8.71 9.02   Symptoms Yes (comment) Yes (comment) No   Comments shortness of breath Shortness of breath-rested then resumed walk  -   Resting HR 80 bpm 79 bpm 81 bpm   Resting BP 120/80 118/60 110/66   Max Ex. HR 114 bpm 105 bpm 96 bpm   Max Ex. BP 158/90 132/82 120/66   2 Minute Post BP 126/78 122/70   -     Interval HR   Baseline HR 80 79  -   1 Minute HR 98 98  -   2 Minute HR 104 105 92   3 Minute HR 111 98 96   4 Minute HR  - 92  -   5 Minute HR 114 98  -   6 Minute HR 114 98 94   2 Minute Post HR 88 85  -   Interval Heart Rate? Yes Yes  -     Interval Oxygen   Interval Oxygen? Yes Yes  -   Baseline Oxygen Saturation % 96 % 97 %  -   Baseline Liters of Oxygen  - 0 L  -   1 Minute Oxygen Saturation % 97 % 94 %  -   1 Minute Liters of Oxygen  - 0 L  -   2 Minute Oxygen Saturation % 93 % 96 % 90 %   2 Minute Liters of Oxygen  - 0 L  -   3 Minute Oxygen Saturation % 93 % 93 % 92 %   3 Minute Liters of Oxygen  - 0 L  -   4 Minute Oxygen Saturation %  - 97 %  -   4 Minute Liters of Oxygen  - 0 L  -   5 Minute Oxygen Saturation % 91 % 95 %  -   5 Minute Liters of Oxygen  - 0 L  -   6 Minute Oxygen Saturation % 93 % 93 % 93 %   6 Minute Liters of Oxygen  - 0 L  -   2 Minute Post Oxygen Saturation % 98 % 98 %  -   2 Minute Post Liters of Oxygen  - 0 L  -      Initial Exercise Prescription:     Initial Exercise Prescription - 07/08/15 1400      Date of Initial Exercise RX and Referring Provider   Referring Provider [name]      Perform Capillary Blood Glucose checks as needed.  Exercise Prescription Changes:     Exercise Prescription Changes    Row Name 07/06/15 1500 07/17/15 1200 07/21/15 1300 08/05/15 1300 08/07/15 1000     Exercise Review   Progression  -  - Yes Yes Yes     Response to Exercise   Blood Pressure (Admit) 124/66  - 110/70 116/68 116/68   Blood Pressure (Exercise) 118/60  - 130/68 130/70 130/70   Blood Pressure (Exit) 102/80  - 100/66 102/60 102/60   Heart Rate (Admit) 91 bpm  - 103 bpm 83 bpm 83 bpm   Heart Rate (Exercise) 87 bpm  -  98 bpm 101 bpm 101 bpm   Heart Rate (Exit) 79 bpm  - 87 bpm 90 bpm 90 bpm   Oxygen Saturation (Admit) 96 %  - 95 % 96 % 96 %   Oxygen Saturation (Exercise) 94 %  - 91 % 92 % 92 %   Oxygen Saturation  (Exit) 95 %  - 99 % 96 % 96 %   Rating of Perceived Exertion (Exercise) 13  - '13 15 15   '$ Perceived Dyspnea (Exercise) 3  - '3 4 4   '$ Symptoms none  - none None None   Duration Progress to 30 minutes of continuous aerobic without signs/symptoms of physical distress Progress to 30 minutes of continuous aerobic without signs/symptoms of physical distress Progress to 45 minutes of aerobic exercise without signs/symptoms of physical distress Progress to 45 minutes of aerobic exercise without signs/symptoms of physical distress Progress to 45 minutes of aerobic exercise without signs/symptoms of physical distress   Intensity THRR unchanged THRR unchanged THRR unchanged THRR unchanged THRR unchanged     Progression   Progression Continue to progress workloads to maintain intensity without signs/symptoms of physical distress. Continue to progress workloads to maintain intensity without signs/symptoms of physical distress. Continue to progress workloads to maintain intensity without signs/symptoms of physical distress. Continue to progress workloads to maintain intensity without signs/symptoms of physical distress. Continue to progress workloads to maintain intensity without signs/symptoms of physical distress.   Average METs 2.6 2.6 2.94 3.34 3.34     Resistance Training   Training Prescription Yes Yes Yes Yes Yes   Weight '2 2 3 3 3   '$ Reps 10-12 10-12 10-12 10-12 10-12     Interval Training   Interval Training No No No No No     Treadmill   MPH 1.'5 2 2 2 2   '$ Grade 0 '1 1 1 1   '$ Minutes '10 10 10 10 15   '$ METs  -  - 2.81 2.81 2.81     NuStep   Level '2 2 3 3 3   '$ Watts '20 20 24 '$ 42 42   Minutes '16 16 15 15 15   '$ METs 1.8 1.8 2.3 3.5 3.5     REL-XR   Level '2 2 2 3 3   '$ Watts 40 50 50 52 52   Minutes '10 10 10 10 10   '$ METs 4 4 3.7 3.9 3.9   Row Name 08/19/15 1500 09/01/15 1500 09/02/15 1400 09/14/15 1100 09/16/15 1400     Exercise Review   Progression Yes Yes Yes Yes Yes     Response to Exercise    Blood Pressure (Admit) 132/72 102/60 118/64 118/66 130/70  right after coughing fit   Blood Pressure (Exercise) 146/84 134/78 146/78 118/68 124/68   Blood Pressure (Exit) 110/64 102/62 98/62  - 102/60   Heart Rate (Admit) 100 bpm 182 bpm 82 bpm 86 bpm 89 bpm   Heart Rate (Exercise) 105 bpm 90 bpm 99 bpm 92 bpm 103 bpm   Heart Rate (Exit) 94 bpm 88 bpm 88 bpm  - 95 bpm   Oxygen Saturation (Admit) 95 % 96 % 97 % 93 % 93 %   Oxygen Saturation (Exercise) 91 % 94 % 93 % 92 % 91 %   Oxygen Saturation (Exit) 98 % 96 % 96 %  - 92 %   Rating of Perceived Exertion (Exercise) '14 13 15 15 15   '$ Perceived Dyspnea (Exercise) '4 4 5 4 5   '$ Symptoms None  None None None None   Duration Progress to 45 minutes of aerobic exercise without signs/symptoms of physical distress Progress to 45 minutes of aerobic exercise without signs/symptoms of physical distress Progress to 45 minutes of aerobic exercise without signs/symptoms of physical distress Progress to 45 minutes of aerobic exercise without signs/symptoms of physical distress Progress to 45 minutes of aerobic exercise without signs/symptoms of physical distress   Intensity THRR unchanged THRR unchanged THRR unchanged THRR unchanged  108-136 THRR unchanged     Progression   Progression Continue to progress workloads to maintain intensity without signs/symptoms of physical distress. Continue to progress workloads to maintain intensity without signs/symptoms of physical distress. Continue to progress workloads to maintain intensity without signs/symptoms of physical distress. Continue to progress workloads to maintain intensity without signs/symptoms of physical distress. Continue to progress workloads to maintain intensity without signs/symptoms of physical distress.   Average METs 3.74 3.73 3.85 3.85 3.26     Resistance Training   Training Prescription Yes Yes Yes Yes Yes   Weight '3 3 3 3 3 '$ lbs   Reps 10-12 10-12 10-12 10-12 10-15     Interval Training    Interval Training No No No No No     Treadmill   MPH 2 2.1 2.1 2.2 2.22   Grade '1 1 1 1 1   '$ Minutes '15 15 15 15 15   '$ METs 2.81 2.81 2.81 2.81 2.81     NuStep   Level '4 4 4 4 4   '$ Minutes '15 15 15 15 15   '$ METs 3.3 3.7 3.6 3.6 3.6     REL-XR   Level '3 3 4 4 4   '$ Watts 66  -  -  -  -   Minutes '15 15 15 15 15   '$ METs 4.7 4.'6 5 5 '$ 3.2     Home Exercise Plan   Plans to continue exercise at  - Home  treadmill Home  treadmill Home  treadmill Home  treadmill   Frequency  - Add 1 additional day to program exercise sessions. Add 1 additional day to program exercise sessions. Add 1 additional day to program exercise sessions. Add 2 additional days to program exercise sessions.   Row Name 09/30/15 1400             Exercise Review   Progression Yes         Response to Exercise   Blood Pressure (Admit) 118/68       Blood Pressure (Exercise) 130/72       Blood Pressure (Exit) 114/60       Heart Rate (Admit) 82 bpm       Heart Rate (Exercise) 110 bpm       Heart Rate (Exit) 88 bpm       Oxygen Saturation (Admit) 93 %       Oxygen Saturation (Exercise) 92 %       Oxygen Saturation (Exit) 96 %       Rating of Perceived Exertion (Exercise) 13       Perceived Dyspnea (Exercise) 4       Symptoms None       Comments Home Exercise Guidelines given 08/24/15       Duration Progress to 45 minutes of aerobic exercise without signs/symptoms of physical distress       Intensity THRR unchanged         Progression   Progression Continue to progress workloads to maintain intensity without signs/symptoms of physical distress.  Average METs 3.33         Resistance Training   Training Prescription Yes       Weight 3 lbs       Reps 10-15         Interval Training   Interval Training No         Treadmill   MPH 2.2       Grade 1       Minutes 15       METs 2.99         NuStep   Level 4       Watts -  67 spm       Minutes 15       METs 3.5         REL-XR   Level 4       Minutes  15       METs 3.5         Home Exercise Plan   Plans to continue exercise at Home  treadmill       Frequency Add 2 additional days to program exercise sessions.          Exercise Comments:     Exercise Comments    Row Name 07/06/15 1201 07/07/15 1355 07/08/15 1400 07/13/15 1242 07/20/15 1219   Exercise Comments Today was the patient's first day of class. The patient's initial exercise prescription (based on the 6 min walk evaluation) was reviewed with the patient. The patient tolerated her exercise well with no symptoms or complications. All vitals were within acceptable ranges.  [name] is off to a good start for exercise.  We will continue to monitor for progression. Ms Onken has doen well her first sessions and has been able to complete continous 10 min bouts on each machine.   Increases were made on the TM for patient. Changes were discussed with patient and tolerated well.  Increases made on the NS for the patient today. Changes were discussed with patient and tolerated well.   [place] Name 07/21/15 1358 08/05/15 1330 08/17/15 1055 08/19/15 1459 08/24/15 1150   Exercise Comments [name] is doing great with exercise and making good progress.  Sh eis now up to 15 min on her first two machines.  We will continue to work on her progress and increasing her time on the treadmill. [name] continues to do well with exercise.  Shs is now up to 14 min on the treadmill.  We will continue to work with her on progression. 6 min walk evaluation was done today. Results were reviewed with patient.  [name] has been doing well with exercise.  She has noticed that when she sleeps well, she feels better overall.  She is making steady increases on workloads.  We will continue to monitor her for progression. Reviewed home exercise with pt today.  Pt plans to use treadmill at home for exercise.  Reviewed THR, pulse, RPE, sign and symptoms, pulse oximetry, and when to call 911 or MD.  Also discussed weather considerations and  indoor options.  Pt voiced understanding.   [place] Name 09/01/15 1529 09/02/15 1453 09/16/15 1407 09/30/15 1425 10/02/15 1103   Exercise Comments [name] is doing well with exercise.  She continues to make improvements and will now be adding in one additional day a week at home.  We will continue to monitor her for progress.  [name] continues to do with exercise.  She is now up to level 4 on the XR.  We will  continue to monitor for progression. [name] continues to do well with exercise.  She is now up to level four on both the XR and NuStep.  We will continue to monitor her progression. [name] will be graduating at her next visit.  She has done great.  She worked her way up to 2.2. mph on treadmill for full 15 min! [name] graduated today from cardiac rehab with 36 sessions completed.  Details of the patient's exercise prescription and what She needs to do in order to continue the prescription and progress were discussed with patient.  Patient was given a copy of prescription and goals.  Patient verbalized understanding.  [name] plans to continue to exercise by walking and using the treadmill at home.      Discharge Exercise Prescription (Final Exercise Prescription Changes):     Exercise Prescription Changes - 09/30/15 1400      Exercise Review   Progression Yes     Response to Exercise   Blood Pressure (Admit) 118/68   Blood Pressure (Exercise) 130/72   Blood Pressure (Exit) 114/60   Heart Rate (Admit) 82 bpm   Heart Rate (Exercise) 110 bpm   Heart Rate (Exit) 88 bpm   Oxygen Saturation (Admit) 93 %   Oxygen Saturation (Exercise) 92 %   Oxygen Saturation (Exit) 96 %   Rating of Perceived Exertion (Exercise) 13   Perceived Dyspnea (Exercise) 4   Symptoms None   Comments Home Exercise Guidelines given 08/24/15   Duration Progress to 45 minutes of aerobic exercise without signs/symptoms of physical distress   Intensity THRR unchanged     Progression   Progression Continue to progress workloads to  maintain intensity without signs/symptoms of physical distress.   Average METs 3.33     Resistance Training   Training Prescription Yes   Weight 3 lbs   Reps 10-15     Interval Training   Interval Training No     Treadmill   MPH 2.2   Grade 1   Minutes 15   METs 2.99     NuStep   Level 4   Watts --  67 spm   Minutes 15   METs 3.5     REL-XR   Level 4   Minutes 15   METs 3.5     Home Exercise Plan   Plans to continue exercise at Home  treadmill   Frequency Add 2 additional days to program exercise sessions.      Nutrition:  Target Goals: Understanding of nutrition guidelines, daily intake of sodium '1500mg'$ , cholesterol '200mg'$ , calories 30% from fat and 7% or less from saturated fats, daily to have 5 or more servings of fruits and vegetables.  Biometrics:    Nutrition Therapy Plan and Nutrition Goals:     Nutrition Therapy & Goals - 07/23/15 1430      Nutrition Therapy   Diet Instructed patient on steps to take to increase calories to prevent further weight loss and to promote weight gain.   Patient has lost approximately 30 lbs since 12/2014.   Protein (specify units) Minimum goal of 60 gms per day. Stressed importance of adequate protein for immune system and to maintain muscle strength.   Fiber 25 grams   Whole Grain Foods 3 servings   Saturated Fats 12 max. grams   Fruits and Vegetables 5 servings/day   Sodium 2000 grams     Personal Nutrition Goals   Personal Goal #1 Include at least 4 oz. of a protein food daily (  refer to list). Supplement protein with 11 oz. of Premier protein shake split into 3 oz. servings 3-4 x/day so as to not overwhelm at one time.   Personal Goal #2 Add butter/margarine, mayonnaise or oil to any appropriate foods as discussed.   Personal Goal #3 Try to include 1 oz equivalent of protein with each meal.    Personal Goal #4 Eat 5-6 small meals/snacks per day when ever possible.   Comments Main goal presently is to prevent  further weight loss and to promote weight gain.     Intervention Plan   Intervention Prescribe, educate and counsel regarding individualized specific dietary modifications aiming towards targeted core components such as weight, hypertension, lipid management, diabetes, heart failure and other comorbidities.;Nutrition handout(s) given to patient.   Expected Outcomes Short Term Goal: A plan has been developed with personal nutrition goals set during dietitian appointment.;Long Term Goal: Adherence to prescribed nutrition plan.      Nutrition Discharge: Rate Your Plate Scores:   Nutrition Goals Re-Evaluation:   Psychosocial: Target Goals: Acknowledge presence or absence of depression, maximize coping skills, provide positive support system. Participant is able to verbalize types and ability to use techniques and skills needed for reducing stress and depression.  Initial Review & Psychosocial Screening:     Initial Psych Review & Screening - 06/30/15 0915      Family Dynamics   Good Support System? Yes   Comments Ms Toole has good support from her son and daughter. Her sisters and brothers live close, so Ms Pratt has help with house chores. She does take care of her husband which is very difficult, especially with her shortness of breath.  She is hopeful that pul rehab will help improve her quality of life.                                  Barriers   Psychosocial barriers to participate in program The patient should benefit from training in stress management and relaxation.     Screening Interventions   Interventions Program counselor consult;Encouraged to exercise      Quality of Life Scores:     Quality of Life - 09/28/15 0750      Quality of Life Scores   Health/Function Pre 15.75 %   Health/Function Post 21 %   Health/Function % Change 33.33 %   Socioeconomic Pre 30 %   Socioeconomic Post 21 %   Socioeconomic % Change  -30 %   Psych/Spiritual Pre 20.57 %    Psych/Spiritual Post 21 %   Psych/Spiritual % Change 2.09 %   Family Pre 26.4 %   Family Post 21 %   Family % Change -20.45 %   GLOBAL Pre 15.75 %   GLOBAL Post 21 %   GLOBAL % Change 33.33 %      PHQ-9: Recent Review Flowsheet Data    Depression screen [facility] 2/9 09/28/2015 06/30/2015   Decreased Interest 2 0   Down, Depressed, Hopeless 1 0   PHQ - 2 Score 3 0   Altered sleeping 1 2   Tired, decreased energy 3 3   Change in appetite 3 3   Feeling bad or failure about yourself  0 0   Trouble concentrating 0 1   Moving slowly or fidgety/restless 2 2   Suicidal thoughts 0 0   PHQ-9 Score 12 11   Difficult doing work/chores - Very difficult  Psychosocial Evaluation and Intervention:     Psychosocial Evaluation - 09/22/15 0831      Discharge Psychosocial Assessment & Intervention   Comments See progress note on 09/21/2015      Psychosocial Re-Evaluation:     Psychosocial Re-Evaluation    Row Name 07/29/15 1124 08/12/15 1223 08/19/15 1045 09/01/15 1531 09/09/15 1102     Psychosocial Re-Evaluation   Interventions  -  -  - Encouraged to attend Pulmonary Rehabilitation for the exercise  -   Comments Counselor follow up with Ms. Vanduyn today reporting she was put on an anti-depressant/anti-anxiety medication last week by her PA and she had a negative reaction almost immediately with heart palpitations and increased tearfulness as well as ongoing sleep issues.  She took for 3 days and decided to d/c even though she spoke with her pharmacist about cutting the dose in half (which she did) she felt it was still too much for her.  She has a follow up with her PCP about this in July and prefers not to take anything to treat these symptoms prior to this.  Counselor commended Ms. Hardenbrook for being so self-aware of her body and symptoms to take good care of herself.  Counselor encouraged her to inform her Dr. of this problem sooner so a possible change of medication could be  attempted, but she states her body is sensitive to medication changes and would prefer to wait.  Counselor will continue to follow with Ms. Altemose as needed.  Follow up with Ms. Pittinger today stating she slept better last night and it has impacted her day in a positive way.  She also reports having met with the dietician and has been started on a protein drink regimen to help her gain weight.  She continues to report restless legs causing her to have interrupted sleep and plans to discuss this with her Dr. next week on 7/18.  Counselor will follow with Ms. Finkler after that appointment to see if anything can be done about her restless legs and lack of sleep since it impacts her mood and everything she does.   Follow up with Ms. Hult today reporting her daughter was hospitalized this morning for a heart condition and Ms. M is worried about her.  She states she has had a few bad days herself and saw the Dr. Burgess Estelle.  He prescribed something for her "restless legs" and she took one last night.  She stated it made her feel weird, but she did sleep better.  Her mood continues to be stable but she is worried about her daughter and concerned whether this new medication will alleviate any of her discomfort.  Counselor will continue to follow with her.   [name] expressed some interest in finding other patients with the same diagnosis to talk about their joint problems.  I shared her information with nurses over [facility] to share with their patients under the same diagnosis if they are interested.  Counselor follow up today with Ms. Judie Petit stating she continues to struggle with breathing and coughing quite a bit.  She sees the pulmonary Dr. next week and is hoping for some help at that time.  She does report that her goal to stop losing weight has been successful since starting the protein shakes that the dietician here recommended.  Ms. Judie Petit states she sleeps okay and that her restless legs have been treated with the  new medication given by her Dr.  Denzil Hughes will continue to follow with her  while in this program.        Vocational Rehabilitation: Provide vocational rehab assistance to qualifying candidates.   Vocational Rehab Evaluation & Intervention:   Education: Education Goals: Education classes will be provided on a weekly basis, covering required topics. Participant will state understanding/return demonstration of topics presented.  Learning Barriers/Preferences:     Learning Barriers/Preferences - 06/30/15 0915      Learning Barriers/Preferences   Learning Barriers None   Learning Preferences Group Instruction;Individual Instruction;Pictoral;Skilled Demonstration;Verbal Instruction;Video;Written Material      Education Topics: General Nutrition Guidelines/Fats and Fiber: -Group instruction provided by verbal, written material, models and posters to present the general guidelines for heart healthy nutrition. Gives an explanation and review of dietary fats and fiber. Flowsheet Row Pulmonary Rehab from 09/18/2015 in [facility] Cardiac and Pulmonary Rehab  Date  08/24/15  Educator  CR  Instruction Review Code  2- meets goals/outcomes      Controlling Sodium/Reading Food Labels: -Group verbal and written material supporting the discussion of sodium use in heart healthy nutrition. Review and explanation with models, verbal and written materials for utilization of the food label. Flowsheet Row Pulmonary Rehab from 09/18/2015 in [facility] Cardiac and Pulmonary Rehab  Date  07/20/15  Educator  CR  Instruction Review Code  2- meets goals/outcomes      Exercise Physiology & Risk Factors: - Group verbal and written instruction with models to review the exercise physiology of the cardiovascular system and associated critical values. Details cardiovascular disease risk factors and the goals associated with each risk factor.   Aerobic Exercise & Resistance Training: - Gives group verbal and written  discussion on the health impact of inactivity. On the components of aerobic and resistive training programs and the benefits of this training and how to safely progress through these programs.   Flexibility, Balance, General Exercise Guidelines: - Provides group verbal and written instruction on the benefits of flexibility and balance training programs. Provides general exercise guidelines with specific guidelines to those with heart or lung disease. Demonstration and skill practice provided. Flowsheet Row Pulmonary Rehab from 09/18/2015 in [facility] Cardiac and Pulmonary Rehab  Date  08/26/15  Educator  AS  Instruction Review Code  2- meets goals/outcomes      Stress Management: - Provides group verbal and written instruction about the health risks of elevated stress, cause of high stress, and healthy ways to reduce stress.   Depression: - Provides group verbal and written instruction on the correlation between heart/lung disease and depressed mood, treatment options, and the stigmas associated with seeking treatment. Flowsheet Row Pulmonary Rehab from 09/18/2015 in [facility] Cardiac and Pulmonary Rehab  Date  07/15/15  Educator  [facility]  Instruction Review Code  2- meets goals/outcomes      Anatomy & Physiology of the Heart: - Group verbal and written instruction and models provide basic cardiac anatomy and physiology, with the coronary electrical and arterial systems. Review of: AMI, Angina, Valve disease, Heart Failure, Cardiac Arrhythmia, Pacemakers, and the ICD.   Cardiac Procedures: - Group verbal and written instruction and models to describe the testing methods done to diagnose heart disease. Reviews the outcomes of the test results. Describes the treatment choices: Medical Management, Angioplasty, or Coronary Bypass Surgery.   Cardiac Medications: - Group verbal and written instruction to review commonly prescribed medications for heart disease. Reviews the medication, class of the drug,  and side effects. Includes the steps to properly store meds and maintain the prescription regimen. Flowsheet Row Pulmonary Rehab from 09/18/2015  in [facility] Cardiac and Pulmonary Rehab  Date  08/14/15  Educator  CE  Instruction Review Code  2- meets goals/outcomes      Go Sex-Intimacy & Heart Disease, Get SMART - Goal Setting: - Group verbal and written instruction through game format to discuss heart disease and the return to sexual intimacy. Provides group verbal and written material to discuss and apply goal setting through the application of the S.M.A.R.T. Method.   Other Matters of the Heart: - Provides group verbal, written materials and models to describe Heart Failure, Angina, Valve Disease, and Diabetes in the realm of heart disease. Includes description of the disease process and treatment options available to the cardiac patient.   Exercise & Equipment Safety: - Individual verbal instruction and demonstration of equipment use and safety with use of the equipment.   Infection Prevention: - Provides verbal and written material to individual with discussion of infection control including proper hand washing and proper equipment cleaning during exercise session. Flowsheet Row Pulmonary Rehab from 09/18/2015 in [facility] Cardiac and Pulmonary Rehab  Date  06/30/15  Educator  LB  Instruction Review Code  2- meets goals/outcomes      Falls Prevention: - Provides verbal and written material to individual with discussion of falls prevention and safety. Flowsheet Row Pulmonary Rehab from 09/18/2015 in [facility] Cardiac and Pulmonary Rehab  Date  06/30/15  Educator  LB  Instruction Review Code  2- meets goals/outcomes      Diabetes: - Individual verbal and written instruction to review signs/symptoms of diabetes, desired ranges of glucose level fasting, after meals and with exercise. Advice that pre and post exercise glucose checks will be done for 3 sessions at entry of  program.    Knowledge Questionnaire Score:     Knowledge Questionnaire Score - 09/28/15 0750      Knowledge Questionnaire Score   Post Score 10/10      Core Components/Risk Factors/Patient Goals at Admission:     Personal Goals and Risk Factors at Admission - 06/30/15 1157      Core Components/Risk Factors/Patient Goals on Admission    Weight Management --  Ms Peterkin prefers not to meet with the dietitian. She has lost 20lbs due to her bronchiectasis. Supplements, such as boost, Ensure make her have diarrhea. She cooks healthy and does eat out occasionally.      Core Components/Risk Factors/Patient Goals Review:      Goals and Risk Factor Review    Row Name 07/06/15 1000 07/08/15 1251 07/17/15 1234 07/20/15 1608 07/24/15 1235     Core Components/Risk Factors/Patient Goals Review   Personal Goals Review Develop more efficient breathing techniques such as purse lipped breathing and diaphragmatic breathing and practicing self-pacing with activity.  - Increase knowledge of respiratory medications and ability to use respiratory devices properly. Increase knowledge of respiratory medications and ability to use respiratory devices properly. Develop more efficient breathing techniques such as purse lipped breathing and diaphragmatic breathing and practicing self-pacing with activity.   Review Reviewed PLB technique; good demonstration of PLB and used it with her exercise goals Ms Alois Cliche arrived very shor of breath. We discussed pacing herself and sing PLB walking to class. After she rested, she did very good with her exercise goals. She is now 108.9lbx and has a very poor appetitie. She would now like to meet with the dietitian  for options to help her gain weight; even drinks like Boost upset her stomach. Ms. Tremain was given a flutter valve today to use  for her secreations.  She demonstrated proper technique when using the flutter valve.  Followed up on Ms Silverthorn and the flutter  valve. She is using it, but has no had trouble bringing up her secretions. Using her SVN when she is wheezy. Keeps it clean with soapy water and once a week with vinigar. I reviewed PLB with Ms. Aybar.  She demonstrates proper technique and knows when to use it.   Expected Outcomes Continue using PLB with exercise goals and activity at home.  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 Continue using Flutter Valve and SVN to manage her secretions and wheezing episodes. PLB will help her with SOB while exercising and with ADLs.  This will help her to be able to do more in both area of her life.    Emma Name 07/24/15 1236 08/03/15 1618 08/17/15 1528 08/19/15 1158 08/19/15 1331     Core Components/Risk Factors/Patient Goals Review   Personal Goals Review Improve shortness of breath with ADL's Sedentary;Increase knowledge of respiratory medications and ability to use respiratory devices properly. Increase Strength and Stamina Increase knowledge of respiratory medications and ability to use respiratory devices properly. Weight Management/Obesity   Review Talked with Ms. MsAlister about a chest vest.  She has an extreme about of mucous that is causing continious coughing spells.  I told her to speak with her physician about  aquiring one if possible. Ms Soman uses her SVN when she feels tight chested and wheezing. She has a good understanding of albuterol and cleaning her nebulizer. She also walked on her treadmill at home for 5 minutes and plans to increase her time on it on off days from  [place]. Ms Niesen improved her Mid 31md by 1171f Minimal Importance difference with COPD is [id number] she has Bronchectasis, this feet improvement is comparible. Discussed using the medication, theophylline. She does have an appointment with Dr FulCyndie Chimeon and can question about this medication. [name] is trying to gain weight. Some days she finds  it hard to heat because of her cough and breathing concerns.    Expected Outcomes Hopefully the chest vest will help manage her secreations and help improve her SOB and ADLs.  Continue exercising on home TM - increasing her time gradually.  - Adding theophylline possibly  to improve her breathing. [name] will use her good breathing days to eat more calories to aid in her weight gain goal. To see weight gain or maintenance during program.   RowHazeltonme 08/24/15 1151 08/24/15 1508 09/11/15 1349 09/16/15 1206 09/22/15 0817     Core Components/Risk Factors/Patient Goals Review   Personal Goals Review Sedentary;Improve shortness of breath with ADL's;Increase Strength and Stamina Improve shortness of breath with ADL's;Develop more efficient breathing techniques such as purse lipped breathing and diaphragmatic breathing and practicing self-pacing with activity. Sedentary;Improve shortness of breath with ADL's Weight Management/Obesity Develop more efficient breathing techniques such as purse lipped breathing and diaphragmatic breathing and practicing self-pacing with activity.;Sedentary;Increase Strength and Stamina;Improve shortness of breath with ADL's;Increase knowledge of respiratory medications and ability to use respiratory devices properly.;Weight Management/Obesity   Review [name] has not been doing much at home recently due to the weather.  She does note that she is feeling better with more strength and stamina on more days. Ms McAUmalis managed her bronchectasis for many years. She has good days and bad days with her breathing and manages her breathing with PLB, pacing ,and  limiting her activity as needed. she knows when to say "no" . Ms Rhodus will be graduating soon, so I followed up with her on exercise plans after [facility]. She lives [place] with several of her relatives on the same property with many walking trails. She plans to walk on the trails with the use of a walker/seat or her husband  follow her with his golf cart. Coming to [facility] has been good for her, but it is so tiring and hard on her breathing. She woud rather exercise at her home. Ms Menger recently saw Dr Guido Sander. She was down 5lbs since last visit. Dr Guido Sander wants her to eat lots of carb's and increase her weight. This will help her battle her bronchiectasis and her fatique, especially her breathing fatique. Ms Sutcliffe will be graduating soon from [facility]. She has increased her exercise goals and her with her mid by 152ft. She recognizes the importance for her to continue exercising, as in her last appointment with Dr Guido Sander who wants her to "keep moving" . Her nephew lives close to her and he has a gym in her home which she can use. Ms Friend has severe shortness of breath and large amounts of secretions with her bronchiectasis, but manages these symptoms well and is compliant with her MDI" and inhaled antibiotic. She is to increase her carbohydrates to keep her weight up - again emphasized by Dr Guido Sander.    Expected Outcomes We discussed home exercise guidelines today to help boost her stamina at home.  She will continue to come to class for exercise as well.  -  -  -  -   Row Name 09/22/15 0827             Core Components/Risk Factors/Patient Goals Review   Review Continue exercising at her nephew's gym, increase her food intake, and continue managing her bronchiectasis through her therapies          Core Components/Risk Factors/Patient Goals at Discharge (Final Review):      Goals and Risk Factor Review - 09/22/15 0827      Core Components/Risk Factors/Patient Goals Review   Review Continue exercising at her nephew's gym, increase her food intake, and continue managing her bronchiectasis through her therapies      ITP Comments:     ITP Comments    Row Name 07/10/15 1121 07/15/15 1128 08/07/15 1049 08/10/15 1118 08/19/15 1156   ITP Comments Today [name] complained of feeling dizzy  after she moved from the [employer] to the treadmill. She sat down and rested for a few minutes.  BP check revealed 88/60. Skin warm and dry.   Given 2 full cups of water before she felt better.  BP reading after the water 124/76. She resumed her exercise and no more concerns occurred.  Talked to her about staying hydrated before, during andafter class.  She voiced understanding.  [name] was concerned about her BP being low and has had times at home she felt dizzy.  I recommended she call her Dr. to discuss symptoms and meds. Wilson has a chronic cough that her MD is treating but continues to do well exercising in [facility].  Educated on A-fib by Hulen Luster (respiratory therapist). Discussed what to do when it occurs (rest) and medication managment. Will be bringing the name of her A-fib med next session to update our med list.  Ms Livengood is having a bad breathing day, as well as yesterday. I talked with her about  taking theophylline and recommended to talk to Dr Cyndie Chime about this treatment. She was too short of breath to exercise today and left early from [place].    [place] Name 09/14/15 1101           ITP Comments Ms Howeth has an appointment with her pulmonologist on 09/15/15. Updated ITP and workloads for patient to review with doctor.           Comments: Discharge ITP

## 2015-10-02 NOTE — Progress Notes (Signed)
Daily Session Note  Patient Details  Name: Ana Washington MRN: [id number] Date of Birth: Ana 16, 1947 Referring Provider:   April Washington Pulmonary Rehab from 07/08/2015 in [facility] Cardiac and Pulmonary Rehab  Referring Provider  Ana Washington      Encounter Date: 10/02/2015  Check In:     Session Check In - 10/02/15 1101      Check-In   Location [facility]-Cardiac & Pulmonary Rehab   Staff Present Gerlene Burdock, RN, BSN;Stacey Blanch Media, RRT, RCP, Respiratory Dareen Piano, BA, ACSM CEP, Exercise Physiologist   Supervising physician immediately available to respond to emergencies [facility] immediately available ER MD   Physician(s) Drs. Jimmye Norman and Archie Balboa   Medication changes reported     No   Fall or balance concerns reported    No   Warm-up and Cool-down Performed as group-led Location manager Performed Yes   VAD Patient? No     VAD patient   Has back up controller? No     Pain Assessment   Currently in Pain? No/denies   Multiple Pain Sites No         Goals Met:  Proper associated with RPD/PD & O2 Sat Independence with exercise equipment Using PLB without cueing & demonstrates good technique Exercise tolerated well Personal goals reviewed Strength training completed today  Goals Unmet:  Not Applicable  Comments:  Ana Washington graduated today from cardiac rehab with 36 sessions completed.  Details of the patient's exercise prescription and what She needs to do in order to continue the prescription and progress were discussed with patient.  Patient was given a copy of prescription and goals.  Patient verbalized understanding.  Ana Washington plans to continue to exercise by walking and using the treadmill at home.    Dr. Emily Filbert is Medical Director for [place] and [facility] Pulmonary Rehabilitation.

## 2015-10-02 NOTE — Progress Notes (Signed)
Discharge Summary  Patient Details  Name: Ana Washington MRN: [id number] Date of Birth: 1945/02/07 Referring Provider:   Flowsheet Row Pulmonary Rehab from 07/08/2015 in [address] [facility]  Referring Provider  [name]       Number of Visits: 36  Reason for Discharge:  Patient reached a stable level of exercise. Patient independent in their exercise.  Smoking History:  History  Smoking Status  . Never Smoker  Smokeless Tobacco  . Not on file    Diagnosis:  Bronchiectasis without complication ([place])  ADL UCSD:     Pulmonary Assessment Scores    Row Name 06/30/15 1032 08/17/15 1521 09/28/15 0749     ADL UCSD   ADL Phase  - Mid Exit   SOB Score total 77 81 73   Rest 1 2 1    Walk 3 3 3    Stairs 4 4 5    Bath 4 3 3    Dress 3 3 3    Shop 4 5 5       Initial Exercise Prescription:     Initial Exercise Prescription - 07/08/15 1400      Date of Initial Exercise RX and Referring Provider   Referring Provider [name]      Discharge Exercise Prescription (Final Exercise Prescription Changes):     Exercise Prescription Changes - 09/30/15 1400      Exercise Review   Progression Yes     Response to Exercise   Blood Pressure (Admit) 118/68   Blood Pressure (Exercise) 130/72   Blood Pressure (Exit) 114/60   Heart Rate (Admit) 82 bpm   Heart Rate (Exercise) 110 bpm   Heart Rate (Exit) 88 bpm   Oxygen Saturation (Admit) 93 %   Oxygen Saturation (Exercise) 92 %   Oxygen Saturation (Exit) 96 %   Rating of Perceived Exertion (Exercise) 13   Perceived Dyspnea (Exercise) 4   Symptoms None   Comments Home Exercise Guidelines given 08/24/15   Duration Progress to 45 minutes of aerobic exercise without signs/symptoms of physical distress   Intensity THRR unchanged     Progression   Progression Continue to progress workloads to maintain intensity without signs/symptoms of physical distress.   Average METs 3.33     Resistance Training    Training Prescription Yes   Weight 3 lbs   Reps 10-15     Interval Training   Interval Training No     Treadmill   MPH 2.2   Grade 1   Minutes 15   METs 2.99     NuStep   Level 4   Watts --  67 spm   Minutes 15   METs 3.5     REL-XR   Level 4   Minutes 15   METs 3.5     Home Exercise Plan   Plans to continue exercise at Home  treadmill   Frequency Add 2 additional days to program exercise sessions.      Functional Capacity:     6 Minute Walk    Row Name 06/30/15 1128 08/17/15 1042 09/23/15 1327     6 Minute Walk   Phase  - Mid Program Discharge   Distance 912 feet 1025 feet 900 feet   Distance % Change  - 12 %  -   Walk Time 4.73 minutes 6 minutes 5 minutes   # of Rest Breaks 1  rest 1:16  1  Rested due to SOB 1   MPH 2.19 1.94 2.04   METS  1.5 2.49 2.6   RPE 15 13 13    Perceived Dyspnea  7 4 5    VO2 Peak  - 8.71 9.02   Symptoms Yes (comment) Yes (comment) No   Comments shortness of breath Shortness of breath-rested then resumed walk  -   Resting HR 80 bpm 79 bpm 81 bpm   Resting BP 120/80 118/60 110/66   Max Ex. HR 114 bpm 105 bpm 96 bpm   Max Ex. BP 158/90 132/82 120/66   2 Minute Post BP 126/78 122/70  -     Interval HR   Baseline HR 80 79  -   1 Minute HR 98 98  -   2 Minute HR 104 105 92   3 Minute HR 111 98 96   4 Minute HR  - 92  -   5 Minute HR 114 98  -   6 Minute HR 114 98 94   2 Minute Post HR 88 85  -   Interval Heart Rate? Yes Yes  -     Interval Oxygen   Interval Oxygen? Yes Yes  -   Baseline Oxygen Saturation % 96 % 97 %  -   Baseline Liters of Oxygen  - 0 L  -   1 Minute Oxygen Saturation % 97 % 94 %  -   1 Minute Liters of Oxygen  - 0 L  -   2 Minute Oxygen Saturation % 93 % 96 % 90 %   2 Minute Liters of Oxygen  - 0 L  -   3 Minute Oxygen Saturation % 93 % 93 % 92 %   3 Minute Liters of Oxygen  - 0 L  -   4 Minute Oxygen Saturation %  - 97 %  -   4 Minute Liters of Oxygen  - 0 L  -   5 Minute Oxygen Saturation % 91 %  95 %  -   5 Minute Liters of Oxygen  - 0 L  -   6 Minute Oxygen Saturation % 93 % 93 % 93 %   6 Minute Liters of Oxygen  - 0 L  -   2 Minute Post Oxygen Saturation % 98 % 98 %  -   2 Minute Post Liters of Oxygen  - 0 L  -      Psychological, QOL, Others - Outcomes: PHQ 2/9: Depression screen [facility] 2/9 09/28/2015 06/30/2015  Decreased Interest 2 0  Down, Depressed, Hopeless 1 0  PHQ - 2 Score 3 0  Altered sleeping 1 2  Tired, decreased energy 3 3  Change in appetite 3 3  Feeling bad or failure about yourself  0 0  Trouble concentrating 0 1  Moving slowly or fidgety/restless 2 2  Suicidal thoughts 0 0  PHQ-9 Score 12 11  Difficult doing work/chores - Very difficult    Quality of Life:     Quality of Life - 09/28/15 0750      Quality of Life Scores   Health/Function Pre 15.75 %   Health/Function Post 21 %   Health/Function % Change 33.33 %   Socioeconomic Pre 30 %   Socioeconomic Post 21 %   Socioeconomic % Change  -30 %   Psych/Spiritual Pre 20.57 %   Psych/Spiritual Post 21 %   Psych/Spiritual % Change 2.09 %   Family Pre 26.4 %   Family Post 21 %   Family % Change -20.45 %   GLOBAL Pre 15.75 %  GLOBAL Post 21 %   GLOBAL % Change 33.33 %      Personal Goals: Goals established at orientation with interventions provided to work toward goal.     Personal Goals and Risk Factors at Admission - 06/30/15 1157      Core Components/Risk Factors/Patient Goals on Admission    Weight Management --  Ms Kret prefers not to meet with the dietitian. She has lost 20lbs due to her bronchiectasis. Supplements, such as boost, Ensure make her have diarrhea. She cooks healthy and does eat out occasionally.       Personal Goals Discharge:     Goals and Risk Factor Review    Row Name 07/06/15 1000 07/08/15 1251 07/17/15 1234 07/20/15 1608 07/24/15 1235     Core Components/Risk Factors/Patient Goals Review   Personal Goals Review Develop more efficient breathing  techniques such as purse lipped breathing and diaphragmatic breathing and practicing self-pacing with activity.  - Increase knowledge of respiratory medications and ability to use respiratory devices properly. Increase knowledge of respiratory medications and ability to use respiratory devices properly. Develop more efficient breathing techniques such as purse lipped breathing and diaphragmatic breathing and practicing self-pacing with activity.   Review Reviewed PLB technique; good demonstration of PLB and used it with her exercise goals Ms Alois Cliche arrived very shor of breath. We discussed pacing herself and sing PLB walking to class. After she rested, she did very good with her exercise goals. She is now 108.9lbx and has a very poor appetitie. She would now like to meet with the dietitian  for options to help her gain weight; even drinks like Boost upset her stomach. Ms. Reddin was given a flutter valve today to use for her secreations.  She demonstrated proper technique when using the flutter valve.  Followed up on Ms Dugan and the flutter valve. She is using it, but has no had trouble bringing up her secretions. Using her SVN when she is wheezy. Keeps it clean with soapy water and once a week with vinigar. I reviewed PLB with Ms. Grindle.  She demonstrates proper technique and knows when to use it.   Expected Outcomes Continue using PLB with exercise goals and activity at home.  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 Continue using Flutter Valve and SVN to manage her secretions and wheezing episodes. PLB will help her with SOB while exercising and with ADLs.  This will help her to be able to do more in both area of her life.    [place] Name 07/24/15 1236 08/03/15 1618 08/17/15 1528 08/19/15 1158 08/19/15 1331     Core Components/Risk Factors/Patient Goals Review   Personal Goals Review Improve shortness of breath with ADL's  Sedentary;Increase knowledge of respiratory medications and ability to use respiratory devices properly. Increase Strength and Stamina Increase knowledge of respiratory medications and ability to use respiratory devices properly. Weight Management/Obesity   Review Talked with Ms. MsAlister about a chest vest.  She has an extreme about of mucous that is causing continious coughing spells.  I told her to speak with her physician about  aquiring one if possible. Ms Dewalt uses her SVN when she feels tight chested and wheezing. She has a good understanding of albuterol and cleaning her nebulizer. She also walked on her treadmill at home for 5 minutes and plans to increase her time on it on off days from  [place]. Ms Delaplane improved her Mid 44mwd  by 132ft. Minimal Importance difference with COPD is 98.60ft. Although she has Bronchectasis, this feet improvement is comparible. Discussed using the medication, theophylline. She does have an appointment with Dr Cyndie Chime soon and can question about this medication. [name] is trying to gain weight. Some days she finds it hard to heat because of her cough and breathing concerns.    Expected Outcomes Hopefully the chest vest will help manage her secreations and help improve her SOB and ADLs.  Continue exercising on home TM - increasing her time gradually.  - Adding theophylline possibly  to improve her breathing. [name] will use her good breathing days to eat more calories to aid in her weight gain goal. To see weight gain or maintenance during program.   [place] Name 08/24/15 1151 08/24/15 1508 09/11/15 1349 09/16/15 1206 09/22/15 0817     Core Components/Risk Factors/Patient Goals Review   Personal Goals Review Sedentary;Improve shortness of breath with ADL's;Increase Strength and Stamina Improve shortness of breath with ADL's;Develop more efficient breathing techniques such as purse lipped breathing and diaphragmatic breathing and practicing self-pacing with activity.  Sedentary;Improve shortness of breath with ADL's Weight Management/Obesity Develop more efficient breathing techniques such as purse lipped breathing and diaphragmatic breathing and practicing self-pacing with activity.;Sedentary;Increase Strength and Stamina;Improve shortness of breath with ADL's;Increase knowledge of respiratory medications and ability to use respiratory devices properly.;Weight Management/Obesity   Review [name] has not been doing much at home recently due to the weather.  She does note that she is feeling better with more strength and stamina on more days. Ms Sapia has managed her bronchectasis for many years. She has good days and bad days with her breathing and manages her breathing with PLB, pacing ,and limiting her activity as needed. she knows when to say "no" . Ms Kizzee will be graduating soon, so I followed up with her on exercise plans after [facility]. She lives on [place] acres with several of her relatives on the same property with many walking trails. She plans to walk on the trails with the use of a walker/seat or her husband follow her with his golf cart. Coming to [place] has been good for her, but it is so tiring and hard on her breathing. She woud rather exercise at her home. Ms Gerads recently saw Dr Cyndie Chime. She was down 5lbs since last visit. Dr Cyndie Chime wants her to eat lots of carb's and increase her weight. This will help her battle her bronchiectasis and her fatique, especially her breathing fatique. Ms Wolcott will be graduating soon from [employer]. She has increased her exercise goals and her 39mwd with her mid by 173ft. She recognizes the importance for her to continue exercising, as in her last appointment with Dr Cyndie Chime who wants her to "keep moving" . Her nephew lives close to her and he has a gym in her home which she can use. Ms Rotherham has severe shortness of breath and large amounts of secretions with her bronchiectasis, but manages these symptoms  well and is compliant with her MDI" and inhaled antibiotic. She is to increase her carbohydrates to keep her weight up - again emphasized by Dr Cyndie Chime.    Expected Outcomes We discussed home exercise guidelines today to help boost her stamina at home.  She will continue to come to class for exercise as well.  -  -  -  -   Row Name 09/22/15 0827             Core Components/Risk Factors/Patient Goals Review  Review Continue exercising at her nephew's gym, increase her food intake, and continue managing her bronchiectasis through her therapies          Nutrition & Weight - Outcomes:    Nutrition:     Nutrition Therapy & Goals - 07/23/15 1430      Nutrition Therapy   Diet Instructed patient on steps to take to increase calories to prevent further weight loss and to promote weight gain.   Patient has lost approximately 30 lbs since 12/2014.   Protein (specify units) Minimum goal of 60 gms per day. Stressed importance of adequate protein for immune system and to maintain muscle strength.   Fiber 25 grams   Whole Grain Foods 3 servings   Saturated Fats 12 max. grams   Fruits and Vegetables 5 servings/day   Sodium 2000 grams     Personal Nutrition Goals   Personal Goal #1 Include at least 4 oz. of a protein food daily (refer to list). Supplement protein with 11 oz. of Premier protein shake split into 3 oz. servings 3-4 x/day so as to not overwhelm at one time.   Personal Goal #2 Add butter/margarine, mayonnaise or oil to any appropriate foods as discussed.   Personal Goal #3 Try to include 1 oz equivalent of protein with each meal.    Personal Goal #4 Eat 5-6 small meals/snacks per day when ever possible.   Comments Main goal presently is to prevent further weight loss and to promote weight gain.     Intervention Plan   Intervention Prescribe, educate and counsel regarding individualized specific dietary modifications aiming towards targeted core components such as weight,  hypertension, lipid management, diabetes, heart failure and other comorbidities.;Nutrition handout(s) given to patient.   Expected Outcomes Short Term Goal: A plan has been developed with personal nutrition goals set during dietitian appointment.;Long Term Goal: Adherence to prescribed nutrition plan.      Nutrition Discharge:   Education Questionnaire Score:     Knowledge Questionnaire Score - 09/28/15 0750      Knowledge Questionnaire Score   Post Score 10/10      Goals reviewed with patient; copy given to patient.

## 2015-10-06 ENCOUNTER — Encounter: Payer: Self-pay | Admitting: *Deleted

## 2015-10-06 DIAGNOSIS — J479 Bronchiectasis, uncomplicated: Secondary | ICD-10-CM

## 2015-10-06 NOTE — Progress Notes (Signed)
Pulmonary Individual Treatment Plan  Patient Details  Name: Ana Washington MRN: [id number] Date of Birth: 04/17/1945 Referring Provider:   [name] Pulmonary Rehab from 07/08/2015 in [facility] Cardiac and Pulmonary Rehab  Referring Provider  [name]      Initial Encounter Date:  Flowsheet Row Pulmonary Rehab from 07/08/2015 in [facility] Cardiac and Pulmonary Rehab  Referring Provider  [name]      Visit Diagnosis: Bronchiectasis without complication ([place])  Patient's Home Medications on Admission:  Current Outpatient Prescriptions:  .  alendronate (FOSAMAX) 70 MG tablet, take 1 tablet by mouth every week, Disp: , Rfl:  .  amLODipine (NORVASC) 5 MG tablet, take 1 tablet by mouth once daily, Disp: , Rfl:  .  azithromycin (ZITHROMAX) 250 MG tablet, Take by mouth., Disp: , Rfl:  .  Calcium Carbonate-Vitamin D (OYSTER SHELL/VITAMIN D) 600-125 MG-UNIT TABS, Take by mouth., Disp: , Rfl:  .  Cholecalciferol (VITAMIN D-1000 MAX ST) 1000 UNITS tablet, Take by mouth., Disp: , Rfl:  .  ciprofloxacin (CIPRO) 500 MG tablet, Take by mouth., Disp: , Rfl:  .  diclofenac sodium (VOLTAREN) 1 % GEL, Apply 2 g topically 4 (four) times daily., Disp: 100 g, Rfl: 1 .  fluocinonide (LIDEX) 0.05 % external solution, prn as needed, Disp: , Rfl:  .  fluticasone (FLONASE) 50 MCG/ACT nasal spray, Place into the nose., Disp: , Rfl:  .  hydrochlorothiazide (MICROZIDE) 12.5 MG capsule, take 1 capsule by mouth once daily, Disp: , Rfl:  .  ketoconazole (NIZORAL) 2 % shampoo, Apply topically., Disp: , Rfl:  .  losartan (COZAAR) 100 MG tablet, take 1 tablet by mouth once daily, Disp: , Rfl:  .  metoprolol succinate (TOPROL-XL) 50 MG 24 hr tablet, Take by mouth., Disp: , Rfl:  .  omeprazole (PRILOSEC OTC) 20 MG tablet, Take by mouth., Disp: , Rfl:  .  rivaroxaban (XARELTO) 20 MG TABS tablet, Take by mouth., Disp: , Rfl:  .  triamcinolone cream (KENALOG) 0.1 %, prn as needed, Disp: , Rfl:   Past Medical  History: Past Medical History:  Diagnosis Date  . 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 Hypertension   . Lung disease     Tobacco Use: History  Smoking Status  . Never Smoker  Smokeless Tobacco  . Not on file    Labs: Recent Review Flowsheet Data    There is no flowsheet data to display.       ADL UCSD:     Pulmonary Assessment Scores    Row Name 08/17/15 1521 09/28/15 0749       ADL UCSD   ADL Phase Mid Exit    SOB Score total 81 73    Rest 2 1    Walk 3 3    Stairs 4 5    Bath 3 3    Dress 3 3    Shop 5 5       Pulmonary Function Assessment:   Exercise Target Goals:    Exercise Program Goal: Individual exercise prescription set with THRR, safety & activity barriers. Participant demonstrates ability to understand and report RPE using BORG scale, to self-measure pulse accurately, and to acknowledge the importance of the exercise prescription.  Exercise Prescription Goal: Starting with aerobic activity 30 plus minutes a day, 3 days per week for initial exercise prescription. Provide home exercise prescription and guidelines that participant acknowledges understanding prior to discharge.  Activity Barriers & Risk Stratification:   6 Minute Walk:  [place] Name 08/17/15 1042 09/23/15 1327       6 Minute Walk   Phase Mid Program Discharge    Distance 1025 feet 900 feet    Distance % Change 12 %  -    Walk Time 6 minutes 5 minutes    # of Rest Breaks 1  Rested due to SOB 1    MPH 1.94 2.04    METS 2.49 2.6    RPE 13 13    Perceived Dyspnea  4 5    VO2 Peak 8.71 9.02    Symptoms Yes (comment) No    Comments Shortness of breath-rested then resumed walk  -    Resting HR 79 bpm 81 bpm    Resting BP 118/60 110/66    Max Ex. HR 105 bpm 96 bpm    Max Ex. BP 132/82 120/66    2 Minute Post BP 122/70  -      Interval HR   Baseline HR 79  -    1 Minute HR 98  -    2 Minute HR 105 92    3 Minute HR 98 96    4 Minute HR 92   -    5 Minute HR 98  -    6 Minute HR 98 94    2 Minute Post HR 85  -    Interval Heart Rate? Yes  -      Interval Oxygen   Interval Oxygen? Yes  -    Baseline Oxygen Saturation % 97 %  -    Baseline Liters of Oxygen 0 L  -    1 Minute Oxygen Saturation % 94 %  -    1 Minute Liters of Oxygen 0 L  -    2 Minute Oxygen Saturation % 96 % 90 %    2 Minute Liters of Oxygen 0 L  -    3 Minute Oxygen Saturation % 93 % 92 %    3 Minute Liters of Oxygen 0 L  -    4 Minute Oxygen Saturation % 97 %  -    4 Minute Liters of Oxygen 0 L  -    5 Minute Oxygen Saturation % 95 %  -    5 Minute Liters of Oxygen 0 L  -    6 Minute Oxygen Saturation % 93 % 93 %    6 Minute Liters of Oxygen 0 L  -    2 Minute Post Oxygen Saturation % 98 %  -    2 Minute Post Liters of Oxygen 0 L  -       Initial Exercise Prescription:   Perform Capillary Blood Glucose checks as needed.  Exercise Prescription Changes:     Exercise Prescription Changes    Row Name 08/07/15 1000 08/19/15 1500 09/01/15 1500 09/02/15 1400 09/14/15 1100     Exercise Review   Progression Yes Yes Yes Yes Yes     Response to Exercise   Blood Pressure (Admit) 116/68 132/72 102/60 118/64 118/66   Blood Pressure (Exercise) 130/70 146/84 134/78 146/78 118/68   Blood Pressure (Exit) 102/60 110/64 102/62 98/62  -   Heart Rate (Admit) 83 bpm 100 bpm 182 bpm 82 bpm 86 bpm   Heart Rate (Exercise) 101 bpm 105 bpm 90 bpm 99 bpm 92 bpm   Heart Rate (Exit) 90 bpm 94 bpm 88 bpm 88 bpm  -   Oxygen Saturation (  Admit) 96 % 95 % 96 % 97 % 93 %   Oxygen Saturation (Exercise) 92 % 91 % 94 % 93 % 92 %   Oxygen Saturation (Exit) 96 % 98 % 96 % 96 %  -   Rating of Perceived Exertion (Exercise) '15 14 13 15 15   '$ Perceived Dyspnea (Exercise) '4 4 4 5 4   '$ Symptoms None None None None None   Duration Progress to 45 minutes of aerobic exercise without signs/symptoms of physical distress Progress to 45 minutes of aerobic exercise without signs/symptoms of  physical distress Progress to 45 minutes of aerobic exercise without signs/symptoms of physical distress Progress to 45 minutes of aerobic exercise without signs/symptoms of physical distress Progress to 45 minutes of aerobic exercise without signs/symptoms of physical distress   Intensity THRR unchanged THRR unchanged THRR unchanged THRR unchanged THRR unchanged  108-136     Progression   Progression Continue to progress workloads to maintain intensity without signs/symptoms of physical distress. Continue to progress workloads to maintain intensity without signs/symptoms of physical distress. Continue to progress workloads to maintain intensity without signs/symptoms of physical distress. Continue to progress workloads to maintain intensity without signs/symptoms of physical distress. Continue to progress workloads to maintain intensity without signs/symptoms of physical distress.   Average METs 3.34 3.74 3.73 3.85 3.85     Resistance Training   Training Prescription Yes Yes Yes Yes Yes   Weight '3 3 3 3 3   '$ Reps 10-12 10-12 10-12 10-12 10-12     Interval Training   Interval Training No No No No No     Treadmill   MPH 2 2 2.1 2.1 2.2   Grade '1 1 1 1 1   '$ Minutes '15 15 15 15 15   '$ METs 2.81 2.81 2.81 2.81 2.81     NuStep   Level '3 4 4 4 4   '$ Watts 42  -  -  -  -   Minutes '15 15 15 15 15   '$ METs 3.5 3.3 3.7 3.6 3.6     REL-XR   Level '3 3 3 4 4   '$ Watts 52 66  -  -  -   Minutes '10 15 15 15 15   '$ METs 3.9 4.7 4.'6 5 5     '$ Home Exercise Plan   Plans to continue exercise at  -  - Home  treadmill Home  treadmill Home  treadmill   Frequency  -  - Add 1 additional day to program exercise sessions. Add 1 additional day to program exercise sessions. Add 1 additional day to program exercise sessions.   [place] Name 09/16/15 1400 09/30/15 1400           Exercise Review   Progression Yes Yes        Response to Exercise   Blood Pressure (Admit) 130/70  right after coughing fit 118/68       Blood Pressure (Exercise) 124/68 130/72      Blood Pressure (Exit) 102/60 114/60      Heart Rate (Admit) 89 bpm 82 bpm      Heart Rate (Exercise) 103 bpm 110 bpm      Heart Rate (Exit) 95 bpm 88 bpm      Oxygen Saturation (Admit) 93 % 93 %      Oxygen Saturation (Exercise) 91 % 92 %      Oxygen Saturation (Exit) 92 % 96 %      Rating of Perceived Exertion (Exercise) 15  13      Perceived Dyspnea (Exercise) 5 4      Symptoms None None      Comments  - Home Exercise Guidelines given 08/24/15      Duration Progress to 45 minutes of aerobic exercise without signs/symptoms of physical distress Progress to 45 minutes of aerobic exercise without signs/symptoms of physical distress      Intensity THRR unchanged THRR unchanged        Progression   Progression Continue to progress workloads to maintain intensity without signs/symptoms of physical distress. Continue to progress workloads to maintain intensity without signs/symptoms of physical distress.      Average METs 3.26 3.33        Resistance Training   Training Prescription Yes Yes      Weight 3 lbs 3 lbs      Reps 10-15 10-15        Interval Training   Interval Training No No        Treadmill   MPH 2.22 2.2      Grade 1 1      Minutes 15 15      METs 2.81 2.99        NuStep   Level 4 4      Watts  - -  67 spm      Minutes 15 15      METs 3.6 3.5        REL-XR   Level 4 4      Minutes 15 15      METs 3.2 3.5        Home Exercise Plan   Plans to continue exercise at Home  treadmill Home  treadmill      Frequency Add 2 additional days to program exercise sessions. Add 2 additional days to program exercise sessions.         Exercise Comments:     Exercise Comments    Row Name 08/17/15 1055 08/19/15 1459 08/24/15 1150 09/01/15 1529 09/02/15 1453   Exercise Comments 6 min walk evaluation was done today. Results were reviewed with patient.  Ana Washington has been doing well with exercise.  She has noticed that when she sleeps  well, she feels better overall.  She is making steady increases on workloads.  We will continue to monitor her for progression. Reviewed home exercise with pt today.  Pt plans to use treadmill at home for exercise.  Reviewed THR, pulse, RPE, sign and symptoms, pulse oximetry, and when to call 911 or MD.  Also discussed weather considerations and indoor options.  Pt voiced understanding. Ana Washington is doing well with exercise.  She continues to make improvements and will now be adding in one additional day a week at home.  We will continue to monitor her for progress.  Ana Washington continues to do with exercise.  She is now up to level 4 on the XR.  We will continue to monitor for progression.   [place] Name 09/16/15 1407 09/30/15 1425 10/02/15 1103       Exercise Comments Ana Washington continues to do well with exercise.  She is now up to level four on both the XR and NuStep.  We will continue to monitor her progression. Ana Washington will be graduating at her next visit.  She has done great.  She worked her way up to 2.2. mph on treadmill for full 15 min! Ana Washington graduated today from cardiac rehab with 36 sessions completed.  Details of the patient's exercise prescription and  what She needs to do in order to continue the prescription and progress were discussed with patient.  Patient was given a copy of prescription and goals.  Patient verbalized understanding.  Ana Washington plans to continue to exercise by walking and using the treadmill at home.        Discharge Exercise Prescription (Final Exercise Prescription Changes):     Exercise Prescription Changes - 09/30/15 1400      Exercise Review   Progression Yes     Response to Exercise   Blood Pressure (Admit) 118/68   Blood Pressure (Exercise) 130/72   Blood Pressure (Exit) 114/60   Heart Rate (Admit) 82 bpm   Heart Rate (Exercise) 110 bpm   Heart Rate (Exit) 88 bpm   Oxygen Saturation (Admit) 93 %   Oxygen Saturation (Exercise) 92 %   Oxygen Saturation (Exit) 96 %   Rating of  Perceived Exertion (Exercise) 13   Perceived Dyspnea (Exercise) 4   Symptoms None   Comments Home Exercise Guidelines given 08/24/15   Duration Progress to 45 minutes of aerobic exercise without signs/symptoms of physical distress   Intensity THRR unchanged     Progression   Progression Continue to progress workloads to maintain intensity without signs/symptoms of physical distress.   Average METs 3.33     Resistance Training   Training Prescription Yes   Weight 3 lbs   Reps 10-15     Interval Training   Interval Training No     Treadmill   MPH 2.2   Grade 1   Minutes 15   METs 2.99     NuStep   Level 4   Watts --  67 spm   Minutes 15   METs 3.5     REL-XR   Level 4   Minutes 15   METs 3.5     Home Exercise Plan   Plans to continue exercise at Home  treadmill   Frequency Add 2 additional days to program exercise sessions.       Nutrition:  Target Goals: Understanding of nutrition guidelines, daily intake of sodium '1500mg'$ , cholesterol '200mg'$ , calories 30% from fat and 7% or less from saturated fats, daily to have 5 or more servings of fruits and vegetables.  Biometrics:    Nutrition Therapy Plan and Nutrition Goals:   Nutrition Discharge: Rate Your Plate Scores:   Psychosocial: Target Goals: Acknowledge presence or absence of depression, maximize coping skills, provide positive support system. Participant is able to verbalize types and ability to use techniques and skills needed for reducing stress and depression.  Initial Review & Psychosocial Screening:   Quality of Life Scores:     Quality of Life - 09/28/15 0750      Quality of Life Scores   Health/Function Pre 15.75 %   Health/Function Post 21 %   Health/Function % Change 33.33 %   Socioeconomic Pre 30 %   Socioeconomic Post 21 %   Socioeconomic % Change  -30 %   Psych/Spiritual Pre 20.57 %   Psych/Spiritual Post 21 %   Psych/Spiritual % Change 2.09 %   Family Pre 26.4 %   Family  Post 21 %   Family % Change -20.45 %   GLOBAL Pre 15.75 %   GLOBAL Post 21 %   GLOBAL % Change 33.33 %      PHQ-9: Recent Review Flowsheet Data    Depression screen [facility] 2/9 09/28/2015 06/30/2015   Decreased Interest 2 0   Down, Depressed, Hopeless 1 0  PHQ - 2 Score 3 0   Altered sleeping 1 2   Tired, decreased energy 3 3   Change in appetite 3 3   Feeling bad or failure about yourself  0 0   Trouble concentrating 0 1   Moving slowly or fidgety/restless 2 2   Suicidal thoughts 0 0   PHQ-9 Score 12 11   Difficult doing work/chores - Very difficult      Psychosocial Evaluation and Intervention:     Psychosocial Evaluation - 09/22/15 0831      Discharge Psychosocial Assessment & Intervention   Comments See progress note on 09/21/2015      Psychosocial Re-Evaluation:     Psychosocial Re-Evaluation    [place] Name 08/12/15 1223 08/19/15 1045 09/01/15 1531 09/09/15 1102       Psychosocial Re-Evaluation   Interventions  -  - Encouraged to attend Pulmonary Rehabilitation for the exercise  -    Comments Follow up with Ana. Washington today stating she slept better last night and it has impacted her day in a positive way.  She also reports having met with the dietician and has been started on a protein drink regimen to help her gain weight.  She continues to report restless legs causing her to have interrupted sleep and plans to discuss this with her Ana. next week on 7/18.  Counselor will follow with Ana Washington after that appointment to see if anything can be done about her restless legs and lack of sleep since it impacts her mood and everything she does.   Follow up with Ana Washington today reporting her daughter was hospitalized this morning for a heart condition and Ana Washington is worried about her.  She states she has had a few bad days herself and saw the Ana. Wilburn Mylar.  He prescribed something for her "restless legs" and she took one last night.  She stated it made her feel weird, but  she did sleep better.  Her mood continues to be stable but she is worried about her daughter and concerned whether this new medication will alleviate any of her discomfort.  Counselor will continue to follow with her.   Ana Washington expressed some interest in finding other patients with the same diagnosis to talk about their joint problems.  I shared her information with nurses over [facility] to share with their patients under the same diagnosis if they are interested.  Counselor follow up today with Ana Washington stating she continues to struggle with breathing and coughing quite a bit.  She sees the pulmonary Ana. next week and is hoping for some help at that time.  She does report that her goal to stop losing weight has been successful since starting the protein shakes that the dietician here recommended.  Ana Washington states she sleeps okay and that her restless legs have been treated with the new medication given by her Ana.  Minus Washington will continue to follow with her while in this program.        Education: Education Goals: Education classes will be provided on a weekly basis, covering required topics. Participant will state understanding/return demonstration of topics presented.  Learning Barriers/Preferences:   Education Topics: Initial Evaluation Education: - Verbal, written and demonstration of respiratory meds, RPE/PD scales, oximetry and breathing techniques. Instruction on use of nebulizers and MDIs: cleaning and proper use, rinsing mouth with steroid doses and importance of monitoring MDI activations. Flowsheet Row Pulmonary Rehab from 09/18/2015 in [facility] Cardiac and Pulmonary Rehab  Date  06/30/15  Educator  LB  Instruction Review Code  2- meets goals/outcomes      General Nutrition Guidelines/Fats and Fiber: -Group instruction provided by verbal, written material, models and posters to present the general guidelines for heart healthy nutrition. Gives an explanation and review of dietary fats and  fiber. Flowsheet Row Pulmonary Rehab from 09/18/2015 in [facility] Cardiac and Pulmonary Rehab  Date  08/24/15  Educator  CR  Instruction Review Code  2- meets goals/outcomes      Controlling Sodium/Reading Food Labels: -Group verbal and written material supporting the discussion of sodium use in heart healthy nutrition. Review and explanation with models, verbal and written materials for utilization of the food label. Flowsheet Row Pulmonary Rehab from 09/18/2015 in [facility] Cardiac and Pulmonary Rehab  Date  07/20/15  Educator  CR  Instruction Review Code  2- meets goals/outcomes      Exercise Physiology & Risk Factors: - Group verbal and written instruction with models to review the exercise physiology of the cardiovascular system and associated critical values. Details cardiovascular disease risk factors and the goals associated with each risk factor.   Aerobic Exercise & Resistance Training: - Gives group verbal and written discussion on the health impact of inactivity. On the components of aerobic and resistive training programs and the benefits of this training and how to safely progress through these programs.   Flexibility, Balance, General Exercise Guidelines: - Provides group verbal and written instruction on the benefits of flexibility and balance training programs. Provides general exercise guidelines with specific guidelines to those with heart or lung disease. Demonstration and skill practice provided. Flowsheet Row Pulmonary Rehab from 09/18/2015 in [facility] Cardiac and Pulmonary Rehab  Date  08/26/15  Educator  AS  Instruction Review Code  2- meets goals/outcomes      Stress Management: - Provides group verbal and written instruction about the health risks of elevated stress, cause of high stress, and healthy ways to reduce stress.   Depression: - Provides group verbal and written instruction on the correlation between heart/lung disease and depressed mood, treatment options,  and the stigmas associated with seeking treatment. Flowsheet Row Pulmonary Rehab from 09/18/2015 in [facility] Cardiac and Pulmonary Rehab  Date  07/15/15  Educator  [facility]  Instruction Review Code  2- meets goals/outcomes      Exercise & Equipment Safety: - Individual verbal instruction and demonstration of equipment use and safety with use of the equipment.   Infection Prevention: - Provides verbal and written material to individual with discussion of infection control including proper hand washing and proper equipment cleaning during exercise session. Flowsheet Row Pulmonary Rehab from 09/18/2015 in [facility] Cardiac and Pulmonary Rehab  Date  06/30/15  Educator  LB  Instruction Review Code  2- meets goals/outcomes      Falls Prevention: - Provides verbal and written material to individual with discussion of falls prevention and safety. Flowsheet Row Pulmonary Rehab from 09/18/2015 in [facility] Cardiac and Pulmonary Rehab  Date  06/30/15  Educator  LB  Instruction Review Code  2- meets goals/outcomes      Diabetes: - Individual verbal and written instruction to review signs/symptoms of diabetes, desired ranges of glucose level fasting, after meals and with exercise. Advice that pre and post exercise glucose checks will be done for 3 sessions at entry of program.   Chronic Lung Diseases: - Group verbal and written instruction to review new updates, new respiratory medications, new advancements in procedures and treatments. Provide informative websites and "800" numbers  of self-education. Flowsheet Row Pulmonary Rehab from 09/18/2015 in [facility] Cardiac and Pulmonary Rehab  Date  08/17/15  Educator  LB  Instruction Review Code  2- meets goals/outcomes      Lung Procedures: - Group verbal and written instruction to describe testing methods done to diagnose lung disease. Review the outcome of test results. Describe the treatment choices: Pulmonary Function Tests, ABGs and oximetry.   Energy  Conservation: - Provide group verbal and written instruction for methods to conserve energy, plan and organize activities. Instruct on pacing techniques, use of adaptive equipment and posture/positioning to relieve shortness of breath.   Triggers: - Group verbal and written instruction to review types of environmental controls: home humidity, furnaces, filters, dust mite/pet prevention, HEPA vacuums. To discuss weather changes, air quality and the benefits of nasal washing.   Exacerbations: - Group verbal and written instruction to provide: warning signs, infection symptoms, calling MD promptly, preventive modes, and value of vaccinations. Review: effective airway clearance, coughing and/or vibration techniques. Create an Sports administrator. Flowsheet Row Pulmonary Rehab from 09/18/2015 in [facility] Cardiac and Pulmonary Rehab  Date  07/27/15  Educator  LB  Instruction Review Code  2- meets goals/outcomes      Oxygen: - Individual and group verbal and written instruction on oxygen therapy. Includes supplement oxygen, available portable oxygen systems, continuous and intermittent flow rates, oxygen safety, concentrators, and Medicare reimbursement for oxygen.   Respiratory Medications: - Group verbal and written instruction to review medications for lung disease. Drug class, frequency, complications, importance of spacers, rinsing mouth after steroid MDI's, and proper cleaning methods for nebulizers. Flowsheet Row Pulmonary Rehab from 09/18/2015 in [facility] Cardiac and Pulmonary Rehab  Date  06/30/15  Educator  LB  Instruction Review Code  2- meets goals/outcomes      AED/CPR: - Group verbal and written instruction with the use of models to demonstrate the basic use of the AED with the basic ABC's of resuscitation. Flowsheet Row Pulmonary Rehab from 09/18/2015 in [facility] Cardiac and Pulmonary Rehab  Date  09/04/15  Educator  CE  Instruction Review Code  2- meets goals/outcomes      Breathing  Retraining: - Provides individuals verbal and written instruction on purpose, frequency, and proper technique of diaphragmatic breathing and pursed-lipped breathing. Applies individual practice skills. Flowsheet Row Pulmonary Rehab from 09/18/2015 in [facility] Cardiac and Pulmonary Rehab  Date  06/30/15  Educator  LB  Instruction Review Code  2- meets goals/outcomes      Anatomy and Physiology of the Lungs: - Group verbal and written instruction with the use of models to provide basic lung anatomy and physiology related to function, structure and complications of lung disease. Flowsheet Row Pulmonary Rehab from 09/18/2015 in [facility] Cardiac and Pulmonary Rehab  Date  07/17/15  Educator  [place]  Instruction Review Code  2- meets goals/outcomes      Heart Failure: - Group verbal and written instruction on the basics of heart failure: signs/symptoms, treatments, explanation of ejection fraction, enlarged heart and cardiomyopathy. Flowsheet Row Pulmonary Rehab from 09/18/2015 in [facility] Cardiac and Pulmonary Rehab  Date  09/18/15 [facility] your numbers]  Educator  [place]  Instruction Review Code  2- meets goals/outcomes      Sleep Apnea: - Individual verbal and written instruction to review Obstructive Sleep Apnea. Review of risk factors, methods for diagnosing and types of masks and machines for OSA.   Anxiety: - Provides group, verbal and written instruction on the correlation between heart/lung disease and anxiety, treatment options,  and management of anxiety. Flowsheet Row Pulmonary Rehab from 09/18/2015 in [facility] Cardiac and Pulmonary Rehab  Date  08/12/15  Educator  [facility]  Instruction Review Code  2- Meets goals/outcomes      Relaxation: - Provides group, verbal and written instruction about the benefits of relaxation for patients with heart/lung disease. Also provides patients with examples of relaxation techniques. Flowsheet Row Pulmonary Rehab from 09/18/2015 in [facility] Cardiac and Pulmonary Rehab  Date   09/09/15  Educator  [facility]  Instruction Review Code  2- Meets goals/outcomes      Knowledge Questionnaire Score:     Knowledge Questionnaire Score - 09/28/15 0750      Knowledge Questionnaire Score   Post Score 10/10       Core Components/Risk Factors/Patient Goals at Admission:   Core Components/Risk Factors/Patient Goals Review:      Goals and Risk Factor Review    Row Name 08/17/15 1528 08/19/15 1158 08/19/15 1331 08/24/15 1151 08/24/15 1508     Core Components/Risk Factors/Patient Goals Review   Personal Goals Review Increase Strength and Stamina Increase knowledge of respiratory medications and ability to use respiratory devices properly. Weight Management/Obesity Sedentary;Improve shortness of breath with ADL's;Increase Strength and Stamina Improve shortness of breath with ADL's;Develop more efficient breathing techniques such as purse lipped breathing and diaphragmatic breathing and practicing self-pacing with activity.   Review Ana Washington improved her Mid 29md by 1168f Minimal Importance difference with COPD is [id number] she has Bronchectasis, this feet improvement is comparible. Discussed using the medication, theophylline. She does have an appointment with Ana FulCyndie Chimeon and can question about this medication. Ana Washington is trying to gain weight. Some days she finds it hard to heat because of her cough and breathing concerns.  Ana Washington has not been doing much at home recently due to the weather.  She does note that she is feeling better with more strength and stamina on more days. Ana Washington managed her bronchectasis for many years. She has good days and bad days with her breathing and manages her breathing with PLB, pacing ,and limiting her activity as needed. she knows when to say "no" .   Expected Outcomes  - Adding theophylline possibly  to improve her breathing. Ana Washington will use her good breathing days to eat more calories to aid in her weight gain goal. To see weight  gain or maintenance during program. We discussed home exercise guidelines today to help boost her stamina at home.  She will continue to come to class for exercise as well.  -   Row Name 09/11/15 1349 09/16/15 1206 09/22/15 0817 09/22/15 0827       Core Components/Risk Factors/Patient Goals Review   Personal Goals Review Sedentary;Improve shortness of breath with ADL's Weight Management/Obesity Develop more efficient breathing techniques such as purse lipped breathing and diaphragmatic breathing and practicing self-pacing with activity.;Sedentary;Increase Strength and Stamina;Improve shortness of breath with ADL's;Increase knowledge of respiratory medications and ability to use respiratory devices properly.;Weight Management/Obesity  -    Review Ana Washington be graduating soon, so I followed up with her on exercise plans after [facility]. [place] [address] with several of her relatives on the same property with many walking trails. She plans to walk on the trails with the use of a walker/seat or her husband follow her with his golf cart. [place], but it is so tiring and hard on her breathing. She woud rather exercise at her home. Ana Washington recently saw Ana Washington. She was down 5lbs since last visit. Ana Washington wants her to eat lots of carb's and increase her weight. This will help her battle her bronchiectasis and her fatique, especially her breathing fatique. Ana Washington will be graduating soon from [employer]. She has increased her exercise goals and her 64md with her mid by 1173f She recognizes the importance for her to continue exercising, as in her last appointment with Ana FuCyndie Chimeho wants her to "keep moving" . Her nephew lives close to her and he has a gym in her home which she can use. Ana McKoestneras severe shortness of breath and large amounts of secretions with her bronchiectasis, but manages these symptoms well and is compliant with her MDI" and  inhaled antibiotic. She is to increase her carbohydrates to keep her weight up - again emphasized by Ana FuCyndie Washington Continue exercising at her nephew's gym, increase her food intake, and continue managing her bronchiectasis through her therapies       Core Components/Risk Factors/Patient Goals at Discharge (Final Review):      Goals and Risk Factor Review - 09/22/15 0827      Core Components/Risk Factors/Patient Goals Review   Review Continue exercising at her nephew's gym, increase her food intake, and continue managing her bronchiectasis through her therapies      ITP Comments:     ITP Comments    Row Name 08/07/15 1049 08/10/15 1118 08/19/15 1156 09/14/15 1101     ITP Comments EvLovenaas a chronic cough that her MD is treating but continues to do well exercising in [employer] Educated on A-fib by La[place] Myrtlerespiratory therapist). Discussed what to do when it occurs (rest) and medication managment. Will be bringing the name of her A-fib med next session to update our med list.  Ana McChaneys having a bad breathing day, as well as yesterday. I talked with her about taking theophylline and recommended to talk to Ana FuCyndie Chimebout this treatment. She was too short of breath to exercise today and [place] appointment with her pulmonologist on 09/15/15. Updated ITP and workloads for patient to review with doctor.        Comments: 30 Day Review

## 2015-10-07 ENCOUNTER — Ambulatory Visit: Payer: Medicare Other

## 2015-10-09 ENCOUNTER — Ambulatory Visit: Payer: Medicare Other

## 2015-10-12 ENCOUNTER — Ambulatory Visit: Payer: Medicare Other

## 2015-10-14 ENCOUNTER — Ambulatory Visit: Payer: Medicare Other

## 2015-10-16 ENCOUNTER — Ambulatory Visit: Payer: Medicare Other

## 2015-10-19 ENCOUNTER — Ambulatory Visit: Payer: Medicare Other

## 2015-10-21 ENCOUNTER — Ambulatory Visit: Payer: Medicare Other

## 2015-10-23 ENCOUNTER — Ambulatory Visit: Payer: Medicare Other

## 2015-10-26 ENCOUNTER — Ambulatory Visit: Payer: Medicare Other

## 2015-10-28 ENCOUNTER — Ambulatory Visit: Payer: Medicare Other

## 2015-10-30 ENCOUNTER — Ambulatory Visit: Payer: Medicare Other

## 2016-06-28 ENCOUNTER — Other Ambulatory Visit: Payer: Self-pay | Admitting: Orthopedic Surgery

## 2016-06-28 DIAGNOSIS — S22070A Wedge compression fracture of T9-T10 vertebra, initial encounter for closed fracture: Secondary | ICD-10-CM

## 2016-06-29 ENCOUNTER — Ambulatory Visit
Admission: RE | Admit: 2016-06-29 | Discharge: 2016-06-29 | Disposition: A | Discharge status: Home / Self Care | Payer: Medicare HMO | Source: Ambulatory Visit | Attending: Orthopedic Surgery | Admitting: Orthopedic Surgery

## 2016-06-29 DIAGNOSIS — X58XXXA Exposure to other specified factors, initial encounter: Secondary | ICD-10-CM | POA: Insufficient documentation

## 2016-06-29 DIAGNOSIS — S22070A Wedge compression fracture of T9-T10 vertebra, initial encounter for closed fracture: Secondary | ICD-10-CM | POA: Diagnosis present

## 2016-07-04 ENCOUNTER — Encounter
Admission: RE | Admit: 2016-07-04 | Discharge: 2016-07-04 | Disposition: A | Discharge status: Home / Self Care | Payer: Medicare HMO | Source: Ambulatory Visit | Attending: Orthopedic Surgery | Admitting: Orthopedic Surgery

## 2016-07-04 DIAGNOSIS — J479 Bronchiectasis, uncomplicated: Secondary | ICD-10-CM | POA: Diagnosis not present

## 2016-07-04 DIAGNOSIS — Z8711 Personal history of peptic ulcer disease: Secondary | ICD-10-CM | POA: Diagnosis not present

## 2016-07-04 DIAGNOSIS — I428 Other cardiomyopathies: Secondary | ICD-10-CM | POA: Diagnosis not present

## 2016-07-04 DIAGNOSIS — I252 Old myocardial infarction: Secondary | ICD-10-CM | POA: Diagnosis not present

## 2016-07-04 DIAGNOSIS — I4891 Unspecified atrial fibrillation: Secondary | ICD-10-CM | POA: Diagnosis not present

## 2016-07-04 DIAGNOSIS — Z8249 Family history of ischemic heart disease and other diseases of the circulatory system: Secondary | ICD-10-CM | POA: Diagnosis not present

## 2016-07-04 DIAGNOSIS — L408 Other psoriasis: Secondary | ICD-10-CM | POA: Diagnosis not present

## 2016-07-04 DIAGNOSIS — G43909 Migraine, unspecified, not intractable, without status migrainosus: Secondary | ICD-10-CM | POA: Diagnosis not present

## 2016-07-04 DIAGNOSIS — Z8379 Family history of other diseases of the digestive system: Secondary | ICD-10-CM | POA: Diagnosis not present

## 2016-07-04 DIAGNOSIS — M1990 Unspecified osteoarthritis, unspecified site: Secondary | ICD-10-CM | POA: Diagnosis not present

## 2016-07-04 DIAGNOSIS — R1084 Generalized abdominal pain: Secondary | ICD-10-CM | POA: Diagnosis not present

## 2016-07-04 DIAGNOSIS — M810 Age-related osteoporosis without current pathological fracture: Secondary | ICD-10-CM | POA: Diagnosis not present

## 2016-07-04 DIAGNOSIS — M4854XA Collapsed vertebra, not elsewhere classified, thoracic region, initial encounter for fracture: Secondary | ICD-10-CM | POA: Diagnosis not present

## 2016-07-04 DIAGNOSIS — Z880 Allergy status to penicillin: Secondary | ICD-10-CM | POA: Diagnosis not present

## 2016-07-04 DIAGNOSIS — K911 Postgastric surgery syndromes: Secondary | ICD-10-CM | POA: Diagnosis not present

## 2016-07-04 DIAGNOSIS — K219 Gastro-esophageal reflux disease without esophagitis: Secondary | ICD-10-CM | POA: Diagnosis not present

## 2016-07-04 DIAGNOSIS — Z8262 Family history of osteoporosis: Secondary | ICD-10-CM | POA: Diagnosis not present

## 2016-07-04 DIAGNOSIS — Z79899 Other long term (current) drug therapy: Secondary | ICD-10-CM | POA: Diagnosis not present

## 2016-07-04 DIAGNOSIS — I10 Essential (primary) hypertension: Secondary | ICD-10-CM | POA: Diagnosis not present

## 2016-07-04 DIAGNOSIS — Z7982 Long term (current) use of aspirin: Secondary | ICD-10-CM | POA: Diagnosis not present

## 2016-07-04 DIAGNOSIS — K5792 Diverticulitis of intestine, part unspecified, without perforation or abscess without bleeding: Secondary | ICD-10-CM | POA: Diagnosis not present

## 2016-07-04 HISTORY — DX: Acute myocardial infarction, unspecified: I21.9

## 2016-07-04 HISTORY — DX: Unspecified osteoarthritis, unspecified site: M19.90

## 2016-07-04 HISTORY — DX: Other specified postprocedural states: Z98.890

## 2016-07-04 HISTORY — DX: Gastro-esophageal reflux disease without esophagitis: K21.9

## 2016-07-04 HISTORY — DX: Nausea with vomiting, unspecified: R11.2

## 2016-07-04 HISTORY — DX: Dyspnea, unspecified: R06.00

## 2016-07-04 HISTORY — DX: Bronchiectasis, uncomplicated: J47.9

## 2016-07-04 LAB — CBC
HCT: 37.2 % (ref 35.0–47.0)
Hemoglobin: 12.8 g/dL (ref 12.0–16.0)
MCH: 33.2 pg (ref 26.0–34.0)
MCHC: 34.3 g/dL (ref 32.0–36.0)
MCV: 96.8 fL (ref 80.0–100.0)
PLATELETS: 360 10*3/uL (ref 150–440)
RBC: 3.85 MIL/uL (ref 3.80–5.20)
RDW: 13.5 % (ref 11.5–14.5)
WBC: 12.6 10*3/uL — ABNORMAL HIGH (ref 3.6–11.0)

## 2016-07-04 LAB — SURGICAL PCR SCREEN
MRSA, PCR: NEGATIVE
STAPHYLOCOCCUS AUREUS: NEGATIVE

## 2016-07-04 LAB — BASIC METABOLIC PANEL
Anion gap: 6 (ref 5–15)
BUN: 13 mg/dL (ref 6–20)
CALCIUM: 9.9 mg/dL (ref 8.9–10.3)
CO2: 31 mmol/L (ref 22–32)
CREATININE: 0.61 mg/dL (ref 0.44–1.00)
Chloride: 97 mmol/L — ABNORMAL LOW (ref 101–111)
GFR calc non Af Amer: >60 mL/min (ref >60)
GLUCOSE: 111 mg/dL — AB (ref 65–99)
Potassium: 2.8 mmol/L — ABNORMAL LOW (ref 3.5–5.1)
Sodium: 134 mmol/L — ABNORMAL LOW (ref 135–145)

## 2016-07-04 NOTE — Patient Instructions (Signed)
Your procedure is scheduled on: 07/05/16 Report to [facility]. 2ND FLOOR MEDICAL MALL ENTRANCE. To find out your arrival time please call [phone] between 1PM - 3PM on  ARRIVAL TIME IS 0600 AM.  Remember: Instructions that are not followed completely may result in serious medical risk, up to and including death, or upon the discretion of your surgeon and anesthesiologist your surgery may need to be rescheduled.    __X__ 1. Do not eat food or drink liquids after midnight. No gum chewing or hard candies.     __X__ 2. No Alcohol for 24 hours before or after surgery.   ____ 3. Bring all medications with you on the day of surgery if instructed.    __X__ 4. Notify your doctor if there is any change in your medical condition     (cold, fever, infections).             __X__5. No smoking within 24 hours of your surgery.     Do not wear jewelry, make-up, hairpins, clips or nail polish.  Do not wear lotions, powders, or perfumes.   Do not shave 48 hours prior to surgery. Men may shave face and neck.  Do not bring valuables to the hospital.    [facility] is not responsible for any belongings or valuables.               Contacts, dentures or bridgework may not be worn into surgery.  Leave your suitcase in the car. After surgery it may be brought to your room.  For patients admitted to the hospital, discharge time is determined by your                treatment team.   Patients discharged the day of surgery will not be allowed to drive home.   Please read over the following fact sheets that you were given:   MRSA Information   __X__ Take these medicines the morning of surgery with A SIP OF WATER:    1. AMLODIPINE  2. DILTIAZEM  3. LOSARTAN  4. METOPROLOL  5. OMEPRAZOLE  6. LORAZEPAM IF NEEDED  ____ Fleet Enema (as directed)   __X__ Use CHG Soap as directed  __X__ Use inhalers on the day of surgery NEBULIZER AT BEDTIME AS USUAL  ____ Stop metformin 2 days prior to  surgery    ____ Take 1/2 of usual insulin dose the night before surgery and none on the morning of surgery.   ____ Stop Coumadin/Plavix/aspirin on PATIENT STOPPED ELOQUIS 06/28/16  __X__ Stop Anti-inflammatories such as Advil, Aleve, Ibuprofen, Motrin, Naproxen, Naprosyn, Goodies,powder, or aspirin products.  OK to take Tylenol.   ____ Stop supplements until after surgery.    ____ Bring C-Pap to the hospital.

## 2016-07-04 NOTE — Pre-Procedure Instructions (Signed)
DISCUSSED HISTORY WITH DR Assunta Gambles. OK TO PROCEED. LAST PULMONARY NOTE ON CHART

## 2016-07-05 ENCOUNTER — Ambulatory Visit: Payer: Medicare HMO | Admitting: Certified Registered"

## 2016-07-05 ENCOUNTER — Encounter: Payer: Self-pay | Admitting: *Deleted

## 2016-07-05 ENCOUNTER — Ambulatory Visit: Payer: Medicare HMO

## 2016-07-05 ENCOUNTER — Encounter
Admission: RE | Disposition: A | Discharge status: Home / Self Care | Payer: Self-pay | Source: Ambulatory Visit | Attending: Orthopedic Surgery

## 2016-07-05 ENCOUNTER — Ambulatory Visit
Admission: RE | Admit: 2016-07-05 | Discharge: 2016-07-05 | Disposition: A | Discharge status: Home / Self Care | Payer: Medicare HMO | Source: Ambulatory Visit | Attending: Orthopedic Surgery | Admitting: Orthopedic Surgery

## 2016-07-05 DIAGNOSIS — M810 Age-related osteoporosis without current pathological fracture: Secondary | ICD-10-CM | POA: Insufficient documentation

## 2016-07-05 DIAGNOSIS — Z8262 Family history of osteoporosis: Secondary | ICD-10-CM | POA: Insufficient documentation

## 2016-07-05 DIAGNOSIS — I10 Essential (primary) hypertension: Secondary | ICD-10-CM | POA: Insufficient documentation

## 2016-07-05 DIAGNOSIS — R1084 Generalized abdominal pain: Secondary | ICD-10-CM | POA: Insufficient documentation

## 2016-07-05 DIAGNOSIS — I428 Other cardiomyopathies: Secondary | ICD-10-CM | POA: Insufficient documentation

## 2016-07-05 DIAGNOSIS — G43909 Migraine, unspecified, not intractable, without status migrainosus: Secondary | ICD-10-CM | POA: Insufficient documentation

## 2016-07-05 DIAGNOSIS — M4854XA Collapsed vertebra, not elsewhere classified, thoracic region, initial encounter for fracture: Secondary | ICD-10-CM | POA: Insufficient documentation

## 2016-07-05 DIAGNOSIS — Z79899 Other long term (current) drug therapy: Secondary | ICD-10-CM | POA: Insufficient documentation

## 2016-07-05 DIAGNOSIS — Z8379 Family history of other diseases of the digestive system: Secondary | ICD-10-CM | POA: Insufficient documentation

## 2016-07-05 DIAGNOSIS — K219 Gastro-esophageal reflux disease without esophagitis: Secondary | ICD-10-CM | POA: Insufficient documentation

## 2016-07-05 DIAGNOSIS — I4891 Unspecified atrial fibrillation: Secondary | ICD-10-CM | POA: Insufficient documentation

## 2016-07-05 DIAGNOSIS — K911 Postgastric surgery syndromes: Secondary | ICD-10-CM | POA: Insufficient documentation

## 2016-07-05 DIAGNOSIS — Z880 Allergy status to penicillin: Secondary | ICD-10-CM | POA: Insufficient documentation

## 2016-07-05 DIAGNOSIS — L408 Other psoriasis: Secondary | ICD-10-CM | POA: Insufficient documentation

## 2016-07-05 DIAGNOSIS — K5792 Diverticulitis of intestine, part unspecified, without perforation or abscess without bleeding: Secondary | ICD-10-CM | POA: Insufficient documentation

## 2016-07-05 DIAGNOSIS — Z8711 Personal history of peptic ulcer disease: Secondary | ICD-10-CM | POA: Insufficient documentation

## 2016-07-05 DIAGNOSIS — Z7982 Long term (current) use of aspirin: Secondary | ICD-10-CM | POA: Insufficient documentation

## 2016-07-05 DIAGNOSIS — Z419 Encounter for procedure for purposes other than remedying health state, unspecified: Secondary | ICD-10-CM

## 2016-07-05 DIAGNOSIS — M1990 Unspecified osteoarthritis, unspecified site: Secondary | ICD-10-CM | POA: Insufficient documentation

## 2016-07-05 DIAGNOSIS — J479 Bronchiectasis, uncomplicated: Secondary | ICD-10-CM | POA: Insufficient documentation

## 2016-07-05 DIAGNOSIS — I252 Old myocardial infarction: Secondary | ICD-10-CM | POA: Insufficient documentation

## 2016-07-05 DIAGNOSIS — Z8249 Family history of ischemic heart disease and other diseases of the circulatory system: Secondary | ICD-10-CM | POA: Insufficient documentation

## 2016-07-05 HISTORY — PX: KYPHOPLASTY: SHX5884

## 2016-07-05 LAB — POCT I-STAT 4, (NA,K, GLUC, HGB,HCT)
GLUCOSE: 89 mg/dL (ref 65–99)
HCT: 41 % (ref 36.0–46.0)
HEMOGLOBIN: 13.9 g/dL (ref 12.0–15.0)
POTASSIUM: 4.1 mmol/L (ref 3.5–5.1)
Sodium: 138 mmol/L (ref 135–145)

## 2016-07-05 SURGERY — KYPHOPLASTY
Anesthesia: General

## 2016-07-05 MED ORDER — MIDAZOLAM HCL 5 MG/5ML IJ SOLN
INTRAMUSCULAR | Status: DC | PRN
  Administered 2016-07-05 (×2): 1 mg via INTRAVENOUS

## 2016-07-05 MED ORDER — PROPOFOL 500 MG/50ML IV EMUL
INTRAVENOUS | Status: DC | PRN
  Administered 2016-07-05: 25 ug/kg/min via INTRAVENOUS

## 2016-07-05 MED ORDER — HYDROCODONE-ACETAMINOPHEN 5-325 MG PO TABS: 1.0000 | ORAL_TABLET | ORAL | Status: DC | PRN

## 2016-07-05 MED ORDER — ONDANSETRON HCL 4 MG/2ML IJ SOLN: 4.0000 mg | Freq: Four times a day (QID) | INTRAMUSCULAR | Status: DC | PRN

## 2016-07-05 MED ORDER — CLINDAMYCIN PHOSPHATE 900 MG/50ML IV SOLN
900.0000 mg | Freq: Once | INTRAVENOUS | Status: AC
  Administered 2016-07-05: 900 mg via INTRAVENOUS

## 2016-07-05 MED ORDER — IOPAMIDOL (ISOVUE-M 200) INJECTION 41%
INTRAMUSCULAR | Status: AC
  Filled 2016-07-05: qty 20

## 2016-07-05 MED ORDER — FENTANYL CITRATE (PF) 100 MCG/2ML IJ SOLN
INTRAMUSCULAR | Status: AC
  Filled 2016-07-05: qty 2

## 2016-07-05 MED ORDER — METOCLOPRAMIDE HCL 10 MG PO TABS: 5.0000 mg | ORAL_TABLET | Freq: Three times a day (TID) | ORAL | Status: DC | PRN

## 2016-07-05 MED ORDER — ONDANSETRON HCL 4 MG PO TABS: 4.0000 mg | ORAL_TABLET | Freq: Four times a day (QID) | ORAL | Status: DC | PRN

## 2016-07-05 MED ORDER — LIDOCAINE HCL (PF) 1 % IJ SOLN
INTRAMUSCULAR | Status: AC
  Filled 2016-07-05: qty 60

## 2016-07-05 MED ORDER — LIDOCAINE HCL (PF) 2 % IJ SOLN
INTRAMUSCULAR | Status: AC
  Filled 2016-07-05: qty 2

## 2016-07-05 MED ORDER — EPHEDRINE SULFATE 50 MG/ML IJ SOLN
INTRAMUSCULAR | Status: AC
  Filled 2016-07-05: qty 1

## 2016-07-05 MED ORDER — PHENYLEPHRINE HCL 10 MG/ML IJ SOLN
INTRAMUSCULAR | Status: AC
  Filled 2016-07-05: qty 1

## 2016-07-05 MED ORDER — GLYCOPYRROLATE 0.2 MG/ML IJ SOLN
INTRAMUSCULAR | Status: AC
  Filled 2016-07-05: qty 1

## 2016-07-05 MED ORDER — LACTATED RINGERS IV SOLN
INTRAVENOUS | Status: DC
  Administered 2016-07-05: 07:00:00 via INTRAVENOUS

## 2016-07-05 MED ORDER — LIDOCAINE 2% (20 MG/ML) 5 ML SYRINGE
INTRAMUSCULAR | Status: DC | PRN
  Administered 2016-07-05: 50 mg via INTRAVENOUS

## 2016-07-05 MED ORDER — BUPIVACAINE-EPINEPHRINE (PF) 0.5% -1:200000 IJ SOLN
INTRAMUSCULAR | Status: DC | PRN
  Administered 2016-07-05: 10 mL

## 2016-07-05 MED ORDER — MIDAZOLAM HCL 2 MG/2ML IJ SOLN
INTRAMUSCULAR | Status: AC
Start: 2016-07-05 — End: 2016-07-05
  Filled 2016-07-05: qty 2

## 2016-07-05 MED ORDER — HYDROCODONE-ACETAMINOPHEN 5-325 MG PO TABS: 1.0000 | ORAL_TABLET | Freq: Four times a day (QID) | ORAL | 0 refills | Status: DC | PRN

## 2016-07-05 MED ORDER — PROPOFOL 10 MG/ML IV BOLUS
INTRAVENOUS | Status: AC
  Filled 2016-07-05: qty 20

## 2016-07-05 MED ORDER — CLINDAMYCIN PHOSPHATE 900 MG/50ML IV SOLN
INTRAVENOUS | Status: AC
  Filled 2016-07-05: qty 50

## 2016-07-05 MED ORDER — METOCLOPRAMIDE HCL 5 MG/ML IJ SOLN: 5.0000 mg | Freq: Three times a day (TID) | INTRAMUSCULAR | Status: DC | PRN

## 2016-07-05 MED ORDER — SODIUM CHLORIDE 0.9 % IV SOLN: INTRAVENOUS | Status: DC

## 2016-07-05 MED ORDER — LIDOCAINE HCL 1 % IJ SOLN
INTRAMUSCULAR | Status: DC | PRN
  Administered 2016-07-05: 20 mL

## 2016-07-05 MED ORDER — FAMOTIDINE 20 MG PO TABS
ORAL_TABLET | ORAL | Status: AC
  Filled 2016-07-05: qty 1

## 2016-07-05 MED ORDER — BUPIVACAINE-EPINEPHRINE (PF) 0.5% -1:200000 IJ SOLN
INTRAMUSCULAR | Status: AC
  Filled 2016-07-05: qty 30

## 2016-07-05 MED ORDER — FENTANYL CITRATE (PF) 100 MCG/2ML IJ SOLN: 25.0000 ug | INTRAMUSCULAR | Status: DC | PRN

## 2016-07-05 MED ORDER — FENTANYL CITRATE (PF) 100 MCG/2ML IJ SOLN
INTRAMUSCULAR | Status: DC | PRN
  Administered 2016-07-05 (×2): 25 ug via INTRAVENOUS
  Administered 2016-07-05: 50 ug via INTRAVENOUS

## 2016-07-05 SURGICAL SUPPLY — 17 items
CEMENT KYPHON CX01A KIT/MIXER (Cement) ×2 IMPLANT
DERMABOND ADVANCED (GAUZE/BANDAGES/DRESSINGS) ×1
DERMABOND ADVANCED .7 DNX12 (GAUZE/BANDAGES/DRESSINGS) ×1 IMPLANT
DEVICE BIOPSY BONE KYPHX (INSTRUMENTS) ×2 IMPLANT
DRAPE C-ARM XRAY 36X54 (DRAPES) ×2 IMPLANT
DURAPREP 26ML APPLICATOR (WOUND CARE) ×2 IMPLANT
GLOVE SURG SYN 9.0  PF PI (GLOVE) ×1
GLOVE SURG SYN 9.0 PF PI (GLOVE) ×1 IMPLANT
GOWN SRG 2XL LVL 4 RGLN SLV (GOWNS) ×1 IMPLANT
GOWN STRL NON-REIN 2XL LVL4 (GOWNS) ×1
GOWN STRL REUS W/ TWL LRG LVL3 (GOWN DISPOSABLE) ×1 IMPLANT
GOWN STRL REUS W/TWL LRG LVL3 (GOWN DISPOSABLE) ×1
PACK KYPHOPLASTY (MISCELLANEOUS) ×2 IMPLANT
STRAP SAFETY BODY (MISCELLANEOUS) ×2 IMPLANT
TRAY KYPHOPAK 15/2 EXPRESS (KITS) ×2 IMPLANT
TRAY KYPHOPAK 15/3 EXPRESS 1ST (MISCELLANEOUS) IMPLANT
TRAY KYPHOPAK 20/3 EXPRESS 1ST (MISCELLANEOUS) IMPLANT

## 2016-07-05 NOTE — Discharge Instructions (Addendum)
Take it easy today. Resume normal activities tomorrow as tolerated. Resume eliquis tomorrow. Remove Band-Aid on Thursday okay to shower after that  [place]   1) The drugs that you were given will stay in your system until tomorrow so for the next 24 hours you should not:  A) Drive an automobile B) Make any legal decisions C) Drink any alcoholic beverage   2) You may resume regular meals tomorrow.  Today it is better to start with liquids and gradually work up to solid foods.  You may eat anything you prefer, but it is better to start with liquids, then soup and crackers, and gradually work up to solid foods.   3) Please notify your doctor immediately if you have any unusual bleeding, trouble breathing, redness and pain at the surgery site, drainage, fever, or pain not relieved by medication.   Please contact your physician with any problems or Same Day Surgery at [phone], Monday through Friday 6 am to 4 pm, or [place] at [facility] number at [phone].

## 2016-07-05 NOTE — H&P (Signed)
Reviewed paper H+P, will be scanned into chart. No changes noted.  

## 2016-07-05 NOTE — Anesthesia Post-op Follow-up Note (Cosign Needed)
Anesthesia QCDR form completed.        

## 2016-07-05 NOTE — Transfer of Care (Signed)
Immediate Anesthesia Transfer of Care Note  Patient: Ana Washington  Procedure(s) Performed: Procedure(s): KYPHOPLASTY T - 9 (N/A)  Patient Location: PACU  Anesthesia Type:General  Level of Consciousness: awake  Airway & Oxygen Therapy: Patient Spontanous Breathing  Post-op Assessment: Report given to RN and Post -op Vital signs reviewed and stable  Post vital signs: Reviewed  Last Vitals:  Vitals:   07/05/16 0610 07/05/16 0828  BP: 138/89 (!) 168/101  Pulse: 84 91  Resp: (!) 24 20  Temp: 36.5 C 37.1 C    Last Pain:  Vitals:   07/05/16 0610  TempSrc: Oral         Complications: No apparent anesthesia complications

## 2016-07-05 NOTE — Op Note (Signed)
07/05/2016  8:31 AM  PATIENT:  Ana Washington  71 y.o. female  PRE-OPERATIVE DIAGNOSIS:  CLOSED WEDGE COMPRESSION, FRACTURE OF 9TH THORACIC VERTEBRA  POST-OPERATIVE DIAGNOSIS:  compression fracture T9  PROCEDURE:  Procedure(s): KYPHOPLASTY T - 9 (N/A)  SURGEON: Laurene Footman, MD  ASSISTANTS: None  ANESTHESIA:   local and MAC  EBL:  Total I/O In: 300 [I.V.:300] Out: 1 [Blood:1]  BLOOD ADMINISTERED:none  DRAINS: none   LOCAL MEDICATIONS USED:  MARCAINE    and XYLOCAINE   SPECIMEN:  Source of Specimen:  T9 vertebral body  DISPOSITION OF SPECIMEN:  PATHOLOGY  COUNTS:  YES  TOURNIQUET:  * No tourniquets in log *  IMPLANTS: none  DICTATION: .Dragon Dictation  patient brought the operating room and after adequate sedation was given the patient was placed prone and C-arm brought in and with excellent visualization of T19. After patient identification and timeout procedure completed 5 cc 1% Xylocaine was infiltrated on both sides. Next the back was prepped and draped in sterile manner and repeat timeout procedure carried out. Spinal needle was used to get local anesthetic down to the pedicle on the right left sides with 10 cc of half percent Sensorcaine with epinephrine and 10 cc of Xylocaine on each side. A small incision was made on the right side and a trocar advanced in an extrapedicular fashion,  biopsy was obtained. The drilling was carried out followed by inflation of a balloon and essentially across the midline so second insertion site was not needed with about 2 cc inflation. The cement was then mixed and inserted when it was the appropriate consistency without 3.5 cc of bone cement filling the vertebral body getting very good interdigitation and coverage from superior to inferior medial right to left sides. When the cement was set the trochar removed and permanent C-arm views obtained. Dermabond were used to close the skin followed by a Band-Aid   PLAN OF CARE: Discharge  to home after PACU  PATIENT DISPOSITION:  PACU - hemodynamically stable.

## 2016-07-05 NOTE — Anesthesia Preprocedure Evaluation (Signed)
Anesthesia Evaluation  Patient identified by MRN, date of birth, ID band Patient awake    Reviewed: Allergy & Precautions, H&P , NPO status , Patient's Chart, lab work & pertinent test results  History of Anesthesia Complications (+) [name] and history of anesthetic complications  Airway Mallampati: III  TM Distance: <3 FB Neck ROM: limited    Dental  (+) Poor Dentition, Missing, Upper Dentures   Pulmonary shortness of breath, COPD,    Pulmonary exam normal breath sounds clear to auscultation       Cardiovascular Exercise Tolerance: Good hypertension, (-) angina+ Past MI  Normal cardiovascular exam Rhythm:regular Rate:Normal     Neuro/Psych negative neurological ROS  negative psych ROS   GI/Hepatic Neg liver ROS, GERD  Controlled and Medicated,  Endo/Other  negative endocrine ROS  Renal/GU negative Renal ROS  negative genitourinary   Musculoskeletal  (+) Arthritis ,   Abdominal   Peds  Hematology negative hematology ROS (+)   Anesthesia Other Findings Past Medical History: No date: Arthritis No date: Bronchiectasis (HCC) No date: Dyspnea 10/03/2013: Essential hypertension, malignant No date: GERD (gastroesophageal reflux disease) No date: Hypertension No date: Lung disease No date: Myocardial infarction ([place]) No date: [name] (postoperative nausea and vomiting)  Past Surgical History: No date: BACK SURGERY No date: CHOLECYSTECTOMY No date: FOOT SURGERY 1990 and 1996: LUNG SURGERY  BMI    Body Mass Index:  14.98 kg/m      Reproductive/Obstetrics negative OB ROS                             Anesthesia Physical Anesthesia Plan  ASA: III  Anesthesia Plan: General   Post-op Pain Management:    Induction: Intravenous  [name] Risk Score and Plan: 4 or greater and Ondansetron, Dexamethasone, Propofol and Treatment may vary due to age  Airway Management Planned: Natural Airway  and Nasal Cannula  Additional Equipment:   Intra-op Plan:   Post-operative Plan:   Informed Consent: I have reviewed the patients History and Physical, chart, labs and discussed the procedure including the risks, benefits and alternatives for the proposed anesthesia with the patient or authorized representative who has indicated his/her understanding and acceptance.   Dental Advisory Given  Plan Discussed with: Anesthesiologist, CRNA and Surgeon  Anesthesia Plan Comments: (Patient consented for risks of anesthesia including but not limited to:  - adverse reactions to medications - damage to teeth, lips or other oral mucosa - sore throat or hoarseness - Damage to heart, brain, lungs or loss of life  Patient voiced understanding.)        Anesthesia Quick Evaluation

## 2016-07-05 NOTE — Pre-Procedure Instructions (Signed)
Potassium results sent to Dr. Rudene Christians and Anesthesia for review, will recheck in a.m.   WBC results sent to Dr. Rudene Christians for review.

## 2016-07-05 NOTE — Anesthesia Postprocedure Evaluation (Signed)
Anesthesia Post Note  Patient: BERTA DENSON  Procedure(s) Performed: Procedure(s) (LRB): KYPHOPLASTY T - 9 (N/A)  Patient location during evaluation: PACU Anesthesia Type: General Level of consciousness: awake and alert Pain management: pain level controlled Vital Signs Assessment: post-procedure vital signs reviewed and stable Respiratory status: spontaneous breathing, nonlabored ventilation, respiratory function stable and patient connected to nasal cannula oxygen Cardiovascular status: blood pressure returned to baseline and stable Postop Assessment: no signs of nausea or vomiting Anesthetic complications: no     Last Vitals:  Vitals:   07/05/16 0928 07/05/16 0944  BP: (!) 159/83 (!) 153/86  Pulse: 85 80  Resp: 18 18  Temp: 36.4 C     Last Pain:  Vitals:   07/05/16 0944  TempSrc:   PainSc: 0-No pain                 Precious Haws Piscitello

## 2016-07-06 LAB — SURGICAL PATHOLOGY

## 2016-07-22 ENCOUNTER — Other Ambulatory Visit: Payer: Self-pay | Admitting: Orthopedic Surgery

## 2016-07-22 DIAGNOSIS — S32040A Wedge compression fracture of fourth lumbar vertebra, initial encounter for closed fracture: Secondary | ICD-10-CM

## 2016-07-25 ENCOUNTER — Ambulatory Visit
Admission: RE | Admit: 2016-07-25 | Discharge: 2016-07-25 | Disposition: A | Discharge status: Home / Self Care | Payer: Medicare HMO | Source: Ambulatory Visit | Attending: Orthopedic Surgery | Admitting: Orthopedic Surgery

## 2016-07-25 DIAGNOSIS — S32040A Wedge compression fracture of fourth lumbar vertebra, initial encounter for closed fracture: Secondary | ICD-10-CM | POA: Diagnosis present

## 2016-07-25 DIAGNOSIS — M48061 Spinal stenosis, lumbar region without neurogenic claudication: Secondary | ICD-10-CM | POA: Insufficient documentation

## 2016-07-25 DIAGNOSIS — M5137 Other intervertebral disc degeneration, lumbosacral region: Secondary | ICD-10-CM | POA: Insufficient documentation

## 2016-07-25 DIAGNOSIS — X58XXXA Exposure to other specified factors, initial encounter: Secondary | ICD-10-CM | POA: Insufficient documentation

## 2016-07-27 ENCOUNTER — Ambulatory Visit: Payer: Medicare HMO

## 2016-09-13 DIAGNOSIS — R634 Abnormal weight loss: Secondary | ICD-10-CM | POA: Insufficient documentation

## 2016-09-13 DIAGNOSIS — R1310 Dysphagia, unspecified: Secondary | ICD-10-CM | POA: Insufficient documentation

## 2016-09-13 DIAGNOSIS — R1319 Other dysphagia: Secondary | ICD-10-CM | POA: Insufficient documentation

## 2016-09-14 ENCOUNTER — Other Ambulatory Visit: Payer: Self-pay | Admitting: Nurse Practitioner

## 2016-09-14 DIAGNOSIS — R1310 Dysphagia, unspecified: Secondary | ICD-10-CM

## 2016-09-16 NOTE — Progress Notes (Addendum)
Ana Washington      Assessment and Plan:  Addendum:  Spoke with patient, and with her consent, I submitted her information to [facility] division for online [place].   Addendum 10/13/16 telephone conversation with patient: Received results of Web [place] from [facility] regarding the patient's MAC. These were discussed with the patient and we are going to do the following:   --Stop cipro and nebulized amikacin.  --Start nebulized hypertonic saline 3 times daily.  --Start percussion vest 3 times daily after hypertonic saline.  They also recommended that we consider clofazimine but this patient is not available in [place](requires an exception application to the FDA) so we decided against this). She is currently enrolled in hospice at home and is comfortable with this decision. She has been started on ativan which has helped with her baseline dyspnea at rest.  The above therapies will likely not prolong her life but may make her breathing easier.  10/13/2016   Bronchiectasis, history of MAC infection. Now with acute exacerbation. -Patient currently has evidence of severe bronchiectasis with chronic mucous expectoration which is excessive. -Continue current inhaled medications, I discussed with the patient that I will need to review her outside records to see what stage of bronchiectasis that she is in.  --Will check a CXR.  --Start inhaled Tobi, in place of amikacin.  --Pt is interested in seeing if there may be any point in going [facility] to look at new therapies, I explained that I would look through her chart and discuss with them further. We'll need to get further reports, as the full sensitivities and culture results are not available in Care Everywhere.  -Currently on chronic azithromycin therapy 250 mg once daily, Cipro 500 mg twice a day to continue. -We'll check sputum culture, patient may benefit from IV and/or a  change in her nebulized antibiotics. --patient has had a cough for 6 months or longer. Flutter valve has been tried and failed, but the patient is not able to coordinate with this, also there is no skilled caregiver in the home to provide CPT.    Chronic hypoxic respiratory failure. Secondary to above. -Continue chronic oxygen therapy. -Discussed the end-stage nature of her disease, I feel that hospice is appropriate, and her life is likely limited. She agrees that her current CODE STATUS is DO NOT RESUSCITATE. She is currently enrolled in hospice, and will continue to try to do therapies that will minimize her symptoms.   Hiatal hernia. -Continue omeprazole 20 mg once daily  Date: 09/16/2016  MRN# [id number] Ana Washington Sep 01, 1945  Referring Physician: Dr. [name] is a 71 y.o. old female seen in [place] for chief complaint of:    Chief Complaint  Patient presents with  . Advice Only    Referred by Dr. Kary Kos   . Recurrent Pneumonia    cough-mucus variety of color---green,red, yellow  . Shortness of Breath    with exhertion    HPI:   She has a diagnosis of bronchiectasis after MAC infection. She takes 250 mg daily azithromycin. She was seeing a doctor at [facility], Dr. Cyndie Chime who is now retired. She had a bronchoscopy around Sept. '16, cultures were sent to [place], she was taken off of cipro at that time.  But then she felt worse, then she was started back on it which helped a bit. She has been started on 2L oxygen, she is followed by community hospice and home  care and is actively in the hospice program though does not appears to understand that her life is limited and thinks that she may still have another 4 or 5 years to live.  She takes amikacin nebs with saline. She has not required a percussion vest as she has no difficulty bringing up sputum.   She is currently on oxygen  No imaging available; CT chest report; 04/23/2012; chronic changes of chronic  bronchitis, bronchiectasis.  ADDENDUM: I personally reviewed. Chest x-ray imaging, chest x-ray 09/19/2016; severe diffuse bronchiectasis changes, which are likely chronic. Possible area of stent in the right upper lobe.  Review and summary of outside records: -Bronchoscopy August 2016, MAC susceptibilities performed at [facility] [place] the susceptibilities are not available, respiratory culture showed gram-negative rods, fungus culture showed Cryptococcus neoformans. --culture with 5-25 colonies of MAC. Sensitivities list resistance to clarithromycin, linezolid, moxifloxacin, and amikacin MIC of 64 (resistant). --She was seen by infectious disease service at [facility] on 10/30/2014, at that time it was recommended that she stop ciprofloxacin, start inhaled amikacin, be enrolled in pulmonary rehabilitation. --Review of records shows pt intolerant of ethambutol and rifampin.    The patient was initially diagnosed with MAC in the early 39s. She underwent anterior segmentectomy and right middle lobectomy in 1991, resection of lingula and basilar left lower lobe in 1996. She has been intolerant of ethambutol and rifampin. She did well for several years, it has been progressively declining since around 12/2013, with particularly fast decline since summer of 2016.   PMHX:   Past Medical History:  Diagnosis Date  . Arthritis   . Bronchiectasis ([place])   . Dyspnea   . 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 GERD (gastroesophageal reflux disease)   . Hypertension   . Lung disease   . Myocardial infarction ([place])   . [name] (postoperative nausea and vomiting)    Surgical Hx:  Past Surgical History:  Procedure Laterality Date  . BACK SURGERY    . CHOLECYSTECTOMY    . FOOT SURGERY    . KYPHOPLASTY N/A 07/05/2016   Procedure: KYPHOPLASTY T - 9;  Surgeon: Hessie Knows, MD;  Location: [facility] ORS;  Service: Orthopedics;  Laterality: N/A;  . LUNG SURGERY  1990 and 1996   Family  Hx:  Family History  Problem Relation Age of Onset  . Hypertension Mother   . Hypertension Father    Social Hx:   Social History  Substance Use Topics  . Smoking status: Never Smoker  . Smokeless tobacco: Never Used  . Alcohol use No   Medication:    Current Outpatient Prescriptions:  .  acetaminophen (TYLENOL) 500 MG tablet, Take 1,000 mg by mouth every 3 (three) hours., Disp: , Rfl:  .  alendronate (FOSAMAX) 70 MG tablet, Take 70 mg by mouth once a week. take 1 tablet by mouth every week, Disp: , Rfl:  .  amikacin (AMIKIN) 1 GM/4ML SOLN injection, Dilute 1 milliliter of amikacin in 3 milliliters of sterile saline in nebulizer chamber once daily, Disp: , Rfl:  .  amLODipine (NORVASC) 5 MG tablet, Take 5 mg by mouth daily. take 1 tablet by mouth once daily, Disp: , Rfl:  .  apixaban (ELIQUIS) 5 MG TABS tablet, Take 5 mg by mouth 2 (two) times daily. , Disp: , Rfl:  .  Ascorbic Acid (VITAMIN C) 1000 MG tablet, Take 1,000 mg by mouth daily., Disp: , Rfl:  .  aspirin EC 81 MG tablet, Take 81 mg by  mouth daily., Disp: , Rfl:  .  azithromycin (ZITHROMAX) 250 MG tablet, Take 250 mg by mouth daily. continuous, Disp: , Rfl:  .  Calcium-Magnesium-Vitamin D (CALCIUM 1200+D3 PO), Take 1 tablet by mouth daily., Disp: , Rfl:  .  Cholecalciferol (VITAMIN D-1000 MAX ST) 1000 UNITS tablet, Take 1,000 Units by mouth daily. , Disp: , Rfl:  .  ciprofloxacin (CIPRO) 500 MG tablet, Take 500 mg by mouth 2 (two) times daily. continuous, Disp: , Rfl:  .  diclofenac sodium (VOLTAREN) 1 % GEL, Apply 2 g topically 4 (four) times daily. (Patient not taking: Reported on 06/30/2016), Disp: 100 g, Rfl: 1 .  diltiazem (CARDIZEM) 30 MG tablet, Take 30 mg by mouth 2 (two) times daily as needed (A-fib). Takes 1 tablet for heart rate over 100 and 2nd tablet 1 hour later if needed, Disp: , Rfl:  .  ferrous sulfate 325 (65 FE) MG EC tablet, Take 325 mg by mouth daily. , Disp: , Rfl:  .  HYDROcodone-acetaminophen (NORCO)  5-325 MG tablet, Take 1 tablet by mouth every 6 (six) hours as needed for moderate pain., Disp: 20 tablet, Rfl: 0 .  LORazepam (ATIVAN) 0.5 MG tablet, TAKE 0.5-1 TABLET BY MOUTH EVERYTWICE DAILY AS NEEDED DYSPNEA, Disp: , Rfl: 2 .  losartan (COZAAR) 100 MG tablet, Take 100 mg by mouth daily. take 1 tablet by mouth once daily, Disp: , Rfl:  .  metoprolol succinate (TOPROL-XL) 50 MG 24 hr tablet, Take 50 mg by mouth daily. , Disp: , Rfl:  .  Multiple Vitamin (MULTIVITAMIN WITH MINERALS) TABS tablet, Take 1 tablet by mouth daily. Women's 50+, Disp: , Rfl:  .  omeprazole (PRILOSEC OTC) 20 MG tablet, Take 40 mg by mouth daily. TAKES 2 TABLETS, Disp: , Rfl:  .  Probiotic Product (ALIGN) 4 MG CAPS, Take 4 mg by mouth daily., Disp: , Rfl:  .  sodium chloride 0.9 % nebulizer solution, Take 3 mLs by nebulization 2 (two) times daily as needed for wheezing. Wheezing , Disp: , Rfl: 0   Allergies:  Codeine; Milk-related compounds; and Penicillins  Review of Systems: Gen:  Denies  fever, sweats, chills HEENT: Denies blurred vision, double vision. bleeds, sore throat Cvc:  No dizziness, chest pain. Resp:   Denies cough or sputum production, shortness of breath Gi: Denies swallowing difficulty, stomach pain. Gu:  Denies bladder incontinence, burning urine Ext:   No Joint pain, stiffness. Skin: No skin rash,  hives  Endoc:  No polyuria, polydipsia. Psych: No depression, insomnia. Other:  All other systems were reviewed with the patient and were negative other that what is mentioned in the HPI.   Physical Examination:   VS: BP 136/86 (BP Location: Left Arm, Cuff Size: Normal)   Pulse 95   Resp 16   Ht _0  (1.651 m)   Wt 93 lb (42.2 kg)   SpO2 96%   BMI 15.48 kg/m   General Appearance: No distress  Neuro:without focal findings,  speech normal,  HEENT: PERRLA, EOM intact.   Pulmonary: normal breath sounds, No wheezing.  CardiovascularNormal S1,S2.  No m/r/g.   Abdomen: Benign, Soft,  non-tender. Renal:  No costovertebral tenderness  GU:  No performed at this time. Endoc: No evident thyromegaly, no signs of acromegaly. Skin:   warm, no rashes, no ecchymosis  Extremities: normal, no cyanosis, clubbing.  Other findings:    LABORATORY PANEL:   CBC No results for input(s): WBC, HGB, HCT, PLT in the last 168 hours. ------------------------------------------------------------------------------------------------------------------  Chemistries  No results for input(s): NA, K, CL, CO2, GLUCOSE, BUN, CREATININE, CALCIUM, MG, AST, ALT, ALKPHOS, BILITOT in the last 168 hours.  Invalid input(s): GFRCGP ------------------------------------------------------------------------------------------------------------------  Cardiac Enzymes No results for input(s): TROPONINI in the last 168 hours. ------------------------------------------------------------  RADIOLOGY:  No results found.     Thank  you for the [place] and for allowing [facility] to assist in the care of your patient. Our recommendations are noted above.  Please contact us if we can be of further service.   Marda Stalker, MD.  Board Certified in Internal Medicine, Pulmonary Medicine, [place], and Sleep Medicine.  [facility] Office Number: [phone]  Patricia Pesa, M.D.  Merton Border, M.D  09/16/2016

## 2016-09-19 ENCOUNTER — Encounter: Payer: Self-pay | Admitting: Internal Medicine

## 2016-09-19 ENCOUNTER — Ambulatory Visit
Admission: RE | Admit: 2016-09-19 | Discharge: 2016-09-19 | Disposition: A | Discharge status: Home / Self Care | Payer: Medicare Other | Source: Ambulatory Visit | Attending: Internal Medicine | Admitting: Internal Medicine

## 2016-09-19 ENCOUNTER — Telehealth: Payer: Self-pay | Admitting: Internal Medicine

## 2016-09-19 ENCOUNTER — Ambulatory Visit (INDEPENDENT_AMBULATORY_CARE_PROVIDER_SITE_OTHER): Admitting: Internal Medicine

## 2016-09-19 ENCOUNTER — Other Ambulatory Visit: Payer: Self-pay | Admitting: *Deleted

## 2016-09-19 VITALS — BP 136/86 | HR 95 | Resp 16 | Ht 65.0 in | Wt 93.0 lb

## 2016-09-19 DIAGNOSIS — J471 Bronchiectasis with (acute) exacerbation: Secondary | ICD-10-CM | POA: Diagnosis not present

## 2016-09-19 DIAGNOSIS — R918 Other nonspecific abnormal finding of lung field: Secondary | ICD-10-CM | POA: Diagnosis not present

## 2016-09-19 MED ORDER — TOBRAMYCIN 300 MG/5ML IN NEBU: 300.0000 mg | INHALATION_SOLUTION | Freq: Every morning | RESPIRATORY_TRACT | 0 refills | Status: AC

## 2016-09-19 NOTE — Telephone Encounter (Signed)
Pt called, was told at visit today to take a sample to lab  She is not sure what lab to take sample to Please call to advise   Also patient updating pharmacy to [facility] [address] (not [facility])

## 2016-09-19 NOTE — Patient Instructions (Addendum)
--  Will try to inhaled Tobi, alternate with amikacin every month.   --will check CXR.   --Will need to obtain outside records.

## 2016-09-19 NOTE — Telephone Encounter (Signed)
Patient has been advised Lab is inside [facility].

## 2016-09-20 ENCOUNTER — Telehealth: Payer: Self-pay | Admitting: Internal Medicine

## 2016-09-20 NOTE — Telephone Encounter (Signed)
Patient advised on sputum specimen. She will need another cup for specimen. She will have someone come by one day to pick up. Patient made aware orders are in system. Nothing further needed.

## 2016-09-20 NOTE — Telephone Encounter (Signed)
Pt caretaker called to make sure she knew where to drop off her sputum sample. She was told pt needed an order. Please call and advise.

## 2016-09-22 ENCOUNTER — Other Ambulatory Visit
Admission: RE | Admit: 2016-09-22 | Discharge: 2016-09-22 | Disposition: A | Discharge status: Home / Self Care | Source: Ambulatory Visit | Attending: Internal Medicine | Admitting: Internal Medicine

## 2016-09-22 DIAGNOSIS — J471 Bronchiectasis with (acute) exacerbation: Secondary | ICD-10-CM | POA: Insufficient documentation

## 2016-09-22 LAB — EXPECTORATED SPUTUM ASSESSMENT W REFEX TO RESP CULTURE

## 2016-09-22 LAB — EXPECTORATED SPUTUM ASSESSMENT W GRAM STAIN, RFLX TO RESP C

## 2016-09-26 ENCOUNTER — Telehealth: Payer: Self-pay | Admitting: Internal Medicine

## 2016-09-26 NOTE — Telephone Encounter (Signed)
Patient talked to Dr. Juanell Fairly about alternating nebulizer tobramycin and amikacin inhalers .  The cost for these medications is too high   Please call to discuss

## 2016-09-27 LAB — CULTURE, RESPIRATORY W GRAM STAIN: Culture: NORMAL

## 2016-09-27 LAB — CULTURE, RESPIRATORY

## 2016-09-27 NOTE — Telephone Encounter (Signed)
Patient has been advised

## 2016-09-27 NOTE — Telephone Encounter (Signed)
Stay on the amikacin. I  Have sent a message to [facility] for consultation on her case, which I discussed with her, I am waiting to hear back.

## 2016-10-13 ENCOUNTER — Telehealth: Payer: Self-pay | Admitting: *Deleted

## 2016-10-13 ENCOUNTER — Other Ambulatory Visit: Payer: Self-pay | Admitting: Internal Medicine

## 2016-10-13 DIAGNOSIS — J479 Bronchiectasis, uncomplicated: Secondary | ICD-10-CM

## 2016-10-13 DIAGNOSIS — J9611 Chronic respiratory failure with hypoxia: Secondary | ICD-10-CM

## 2016-10-13 NOTE — Telephone Encounter (Signed)
-----   Message from Laverle Hobby, MD sent at 10/13/2016 11:49 AM EDT ----- Regarding: recommendations for patient.  Received results of Web consultation from [facility] regarding the patient's MAC. These were discussed with the patient and we are going to do the following:   --Stop cipro and nebulized amikacin.  --Start nebulized hypertonic saline 3 times daily.  --Start percussion vest 3 times daily after hypertonic saline.  --Follow up with me in 3 months.

## 2016-10-13 NOTE — Telephone Encounter (Signed)
Orders for therapy will need to go to hospice.

## 2016-10-14 ENCOUNTER — Telehealth: Payer: Self-pay | Admitting: Internal Medicine

## 2016-10-14 NOTE — Telephone Encounter (Signed)
States they are seeing pt for pallative care only, pt is being followed [facility]. They are D/C for hospice. States the fax sent earlier needs to go to [facility]

## 2016-10-14 NOTE — Telephone Encounter (Signed)
Referral faxed to [facility]. There office was closed therefore not able to speak with anyone in regards to referral. Nothing further needed unless someone calls back.

## 2016-10-14 NOTE — Telephone Encounter (Signed)
Cindy with [facility] called back to verify orders. Hospice will order nebulized hypertonic saline tid. I informed our office would process order for percussion vest. Nothing further needed.

## 2016-10-18 ENCOUNTER — Telehealth: Payer: Self-pay | Admitting: *Deleted

## 2016-10-18 NOTE — Telephone Encounter (Signed)
-----   Message from Laverle Hobby, MD sent at 10/18/2016  3:19 PM EDT ----- Regarding: RE: confusion Ok.  ----- Message ----- From: Devona Konig, CMA Sent: 10/14/2016   1:19 PM To: Laverle Hobby, MD Subject: confusion                                      I processed referral to [facility] for Ms. Copus for nebulized hypertonic saline tid. Suanne Marker will process order for percussion vest.  In regards to stopping Cipro. Patient told hospice personnel she is not comfortable with stopping Cipro. I wanted to be sure you were aware of this.

## 2016-10-25 LAB — FUNGUS CULTURE WITH STAIN

## 2016-10-25 LAB — FUNGUS CULTURE RESULT

## 2016-10-25 LAB — FUNGAL ORGANISM REFLEX

## 2016-11-04 ENCOUNTER — Telehealth: Payer: Self-pay | Admitting: Internal Medicine

## 2016-11-04 NOTE — Telephone Encounter (Signed)
Left message for Ana Washington that we have not received any information on percussion vest status from the company. As soon as we find out we will inform.

## 2016-11-04 NOTE — Telephone Encounter (Signed)
Please call to discuss status of percussion vest.

## 2016-11-08 ENCOUNTER — Telehealth: Payer: Self-pay | Admitting: *Deleted

## 2016-11-08 NOTE — Telephone Encounter (Signed)
[  11/08/2016 11:06 AM] Ana Washington, Ana Washington:  Ana Washington contacted Ana Washington for in[employer] about Ana Washington on that vest.  He stated that Ana Washington needed to go in his last office note and made 2 additional notes.  1) that patient has had a cough for 6 months or longer. 2) other treatments that have been tried and failed (ex Flutter Value or either indicate that there is not a skilled caregiver in the home to provide manual CPT).  That is why they haven't received it yet.

## 2016-11-11 ENCOUNTER — Telehealth: Payer: Self-pay | Admitting: Internal Medicine

## 2016-11-11 NOTE — Telephone Encounter (Signed)
OV note faxed to [facility] at in [facility]). Nothing else needed at this time. Rhonda J Cobb

## 2016-11-11 NOTE — Telephone Encounter (Signed)
Added to note

## 2016-11-11 NOTE — Telephone Encounter (Signed)
[facility]xd Percussion vest and hospice received notice from DME that this was denied   Please call to discuss care plan

## 2016-11-14 NOTE — Telephone Encounter (Signed)
Spoke with Ronalee Belts at [employer] (in courage vest) who stated that [place] will not cover the vest if the patient is in hospice. Can't have vest therapy and hospice care together. Either can D/C if agreeable with Physician and Hospice from [place] or Hospice if willing can purchase the in courage vest. Carlos Levering Cobb Please advise. Rhonda J Cobb

## 2016-11-15 ENCOUNTER — Telehealth: Payer: Self-pay | Admitting: *Deleted

## 2016-11-15 ENCOUNTER — Other Ambulatory Visit: Payer: Self-pay | Admitting: Internal Medicine

## 2016-11-15 DIAGNOSIS — J479 Bronchiectasis, uncomplicated: Secondary | ICD-10-CM

## 2016-11-15 MED ORDER — AMBULATORY NON FORMULARY MEDICATION: 0 refills | Status: DC

## 2016-11-15 NOTE — Telephone Encounter (Signed)
Morton Peters, RN with Hospice has called and stated that pt has been off antibiotics for 3 weeks waiting on this in courage vest.  Now, being told that the in courage vest can't be provided for patient as long as she is under [place].    Please contact [name] back ASAP at [phone].  In the meantime I will contact [employer] and find out how much this vest will cost. Catha Gosselin

## 2016-11-15 NOTE — Telephone Encounter (Signed)
Tina nurse calling from Hospice on patient with concerns for Dr. Ashby Dawes that need to be addressed in regards to abx being stopped and percussion vest. Please call [name] @ [phone].

## 2016-12-15 NOTE — Telephone Encounter (Signed)
I think this was sent to the wrong department. Routing back for review

## 2016-12-15 NOTE — Telephone Encounter (Signed)
Patient cancelled upcoming appt and wants to be seen in Jan   She states she was told by Ram to wear the vest ( she paid OOP for this ) for 3 months and then fu '  She has had it for 2 weeks and will call back late December to schedule appt

## 2016-12-26 ENCOUNTER — Ambulatory Visit: Payer: Medicare HMO | Admitting: Internal Medicine

## 2017-02-06 NOTE — Progress Notes (Addendum)
[facility] Consultation      Assessment and Plan:  Bronchiectasis, history of MAC infection. Now with acute exacerbation. -Patient currently has evidence of severe bronchiectasis with chronic mucous expectoration which is excessive. -Continue current medications (oral cipro, azithro), percussion vest with hypertonic saline.    Chronic hypoxic respiratory failure. Secondary to above. -Continue chronic oxygen therapy. -Discussed the end-stage nature of her disease, I feel that hospice is appropriate, and her life is likely limited. She agrees that her current CODE STATUS is DO NOT RESUSCITATE. She is currently enrolled in hospice, and will continue to try to do therapies that will minimize her symptoms.   Hiatal hernia. -Continue omeprazole 20 mg once daily  Orders Placed This Encounter  Procedures  . Culture, sputum-assessment  . AFB Culture & Smear  . DG Chest 2 View   Return in about 3 months (around 05/08/2017).    Date: 02/06/2017  MRN# [id number] Ana [place] 05-10-1945  Referring Physician: Dr. [name] is a 73 y.o. old female seen in consultation for chief complaint of:    Chief Complaint  Patient presents with  . Recurrent Pneumonia    Pt still has cough with mucus yellow-green.She is wearing percussion vest twice daily.  . Shortness of Breath    with exertion/ wheezing she is on 3.5 L with 02    HPI:   She has a diagnosis of severe, advanced bronchiectasis with history of MAC. She has allergies to multiple MAC medications, therefore she is on an empiric regimen, she has been started on flutter vest. She has gained 7 pounds since her last visit.  She is still very winded today, but overall she is feeling better and has more energy. She is here with her daughter today who provides some of her history.   She is doing hypertonic saline with the percussion vest twice per day, azithro daily. She is also on cipro 500 mg twice daily.  They have noted that if she stops this her breathing gets worse.   No imaging available; CT chest report; 04/23/2012; chronic changes of chronic bronchitis, bronchiectasis.  I personally reviewed. Chest x-ray imaging, chest x-ray 09/19/2016; severe diffuse bronchiectasis changes, which are likely chronic.   Review and summary of outside records: -Bronchoscopy August 2016, MAC susceptibilities performed at [facility] at [place] the susceptibilities are not available, respiratory culture showed gram-negative rods, fungus culture showed Cryptococcus neoformans. --culture with 5-25 colonies of MAC. Sensitivities list resistance to clarithromycin, linezolid, moxifloxacin, and amikacin MIC of 64 (resistant). --She was seen by infectious disease service at [facility] on 10/30/2014, at that time it was recommended that she stop ciprofloxacin, start inhaled amikacin, be enrolled in pulmonary rehabilitation. --Review of records shows pt intolerant of ethambutol and rifampin.    The patient was initially diagnosed with MAC in the early 46s. She underwent anterior segmentectomy and right middle lobectomy in 1991, resection of lingula and basilar left lower lobe in 1996. She has been intolerant of ethambutol and rifampin. She did well for several years, it has been progressively declining since around 12/2013, with particularly fast decline since summer of 2016.  Social Hx:   Social History   Tobacco Use  . Smoking status: Never Smoker  . Smokeless tobacco: Never Used  Substance Use Topics  . Alcohol use: No  . Drug use: No   Medication:    Current Outpatient Medications:  .  acetaminophen (TYLENOL) 500 MG tablet, Take 1,000 mg  by mouth every 3 (three) hours., Disp: , Rfl:  .  alendronate (FOSAMAX) 70 MG tablet, Take 70 mg by mouth once a week. take 1 tablet by mouth every week, Disp: , Rfl:  .  AMBULATORY NON FORMULARY MEDICATION, Medication Name: Flutter valve use 10-15 times daily.  DX:, Disp: 1 each, Rfl: 0 .  amikacin (AMIKIN) 1 GM/4ML SOLN injection, Dilute 1 milliliter of amikacin in 3 milliliters of sterile saline in nebulizer chamber once daily, Disp: , Rfl:  .  amLODipine (NORVASC) 5 MG tablet, Take 5 mg by mouth daily. take 1 tablet by mouth once daily, Disp: , Rfl:  .  apixaban (ELIQUIS) 5 MG TABS tablet, Take 5 mg by mouth 2 (two) times daily. , Disp: , Rfl:  .  Ascorbic Acid (VITAMIN C) 1000 MG tablet, Take 1,000 mg by mouth daily., Disp: , Rfl:  .  aspirin EC 81 MG tablet, Take 81 mg by mouth daily., Disp: , Rfl:  .  azithromycin (ZITHROMAX) 250 MG tablet, Take 250 mg by mouth daily. continuous, Disp: , Rfl:  .  Calcium Carbonate-Vitamin D (OYSTER SHELL/VITAMIN D) 600-125 MG-UNIT TABS, Take 1 tablet by mouth daily., Disp: , Rfl:  .  Calcium-Magnesium-Vitamin D (CALCIUM 1200+D3 PO), Take 1 tablet by mouth daily., Disp: , Rfl:  .  Cholecalciferol (VITAMIN D-1000 MAX ST) 1000 UNITS tablet, Take 1,000 Units by mouth daily. , Disp: , Rfl:  .  ciprofloxacin (CIPRO) 500 MG tablet, Take 500 mg by mouth 2 (two) times daily. continuous, Disp: , Rfl:  .  diclofenac sodium (VOLTAREN) 1 % GEL, Apply 2 g topically 4 (four) times daily. (Patient not taking: Reported on 06/30/2016), Disp: 100 g, Rfl: 1 .  diltiazem (CARDIZEM) 30 MG tablet, Take 30 mg by mouth 2 (two) times daily as needed (A-fib). Takes 1 tablet for heart rate over 100 and 2nd tablet 1 hour later if needed, Disp: , Rfl:  .  ferrous sulfate 325 (65 FE) MG EC tablet, Take 325 mg by mouth daily. , Disp: , Rfl:  .  HYDROcodone-acetaminophen (NORCO) 5-325 MG tablet, Take 1 tablet by mouth every 6 (six) hours as needed for moderate pain., Disp: 20 tablet, Rfl: 0 .  LORazepam (ATIVAN) 0.5 MG tablet, TAKE 0.5-1 TABLET BY MOUTH EVERYTWICE DAILY AS NEEDED DYSPNEA, Disp: , Rfl: 2 .  losartan (COZAAR) 100 MG tablet, Take 100 mg by mouth daily. take 1 tablet by mouth once daily, Disp: , Rfl:  .  methocarbamol (ROBAXIN) 500 MG  tablet, Take 500 mg by mouth 4 (four) times daily., Disp: , Rfl: 1 .  metoprolol succinate (TOPROL-XL) 50 MG 24 hr tablet, Take 50 mg by mouth daily. , Disp: , Rfl:  .  Multiple Vitamin (MULTIVITAMIN WITH MINERALS) TABS tablet, Take 1 tablet by mouth daily. Women's 50+, Disp: , Rfl:  .  omeprazole (PRILOSEC OTC) 20 MG tablet, Take 40 mg by mouth daily. TAKES 2 TABLETS, Disp: , Rfl:  .  Probiotic Product (ALIGN) 4 MG CAPS, Take 4 mg by mouth daily., Disp: , Rfl:  .  sodium chloride 0.9 % nebulizer solution, Take 3 mLs by nebulization 2 (two) times daily as needed for wheezing. Wheezing , Disp: , Rfl: 0 .  traMADol (ULTRAM) 50 MG tablet, Take 50 mg by mouth every 6 (six) hours as needed. for pain, Disp: , Rfl: 0   Allergies:  Codeine; Lactalbumin; Milk-related compounds; and Penicillins  Review of Systems: Gen:  Denies  fever, sweats, chills HEENT: Denies blurred  vision, double vision. bleeds, sore throat Cvc:  No dizziness, chest pain. Resp:   Denies cough or sputum production, shortness of breath Gi: Denies swallowing difficulty, stomach pain. Gu:  Denies bladder incontinence, burning urine Ext:   No Joint pain, stiffness. Skin: No skin rash,  hives  Endoc:  No polyuria, polydipsia. Psych: No depression, insomnia. Other:  All other systems were reviewed with the patient and were negative other that what is mentioned in the HPI.   Physical Examination:   VS: BP 108/80 (BP Location: Left Arm, Cuff Size: Normal)   Pulse 83   Resp 16   Ht _0  (1.651 m)   Wt 100 lb (45.4 kg)   SpO2 93%   BMI 16.64 kg/m   General Appearance: No distress  Neuro:without focal findings,  speech normal,  HEENT: PERRLA, EOM intact.   Pulmonary: normal breath sounds, No wheezing. Scattered rhonchi.  CardiovascularNormal S1,S2.  No m/r/g.   Abdomen: Benign, Soft, non-tender. Renal:  No costovertebral tenderness  GU:  No performed at this time. Endoc: No evident thyromegaly, no signs of  acromegaly. Skin:   warm, no rashes, no ecchymosis  Extremities: normal, no cyanosis, clubbing.  Other findings:    LABORATORY PANEL:   CBC No results for input(s): WBC, HGB, HCT, PLT in the last 168 hours. ------------------------------------------------------------------------------------------------------------------  Chemistries  No results for input(s): NA, K, CL, CO2, GLUCOSE, BUN, CREATININE, CALCIUM, MG, AST, ALT, ALKPHOS, BILITOT in the last 168 hours.  Invalid input(s): GFRCGP ------------------------------------------------------------------------------------------------------------------  Cardiac Enzymes No results for input(s): TROPONINI in the last 168 hours. ------------------------------------------------------------  RADIOLOGY:  No results found.     Thank  you for the consultation and for allowing [facility] to assist in the care of your patient. Our recommendations are noted above.  Please contact us if we can be of further service.   Marda Stalker, MD.  Board Certified in Internal Medicine, Pulmonary Medicine, [place], and Sleep Medicine.  [facility] Office Number: [phone]  Patricia Pesa, M.D.  Merton Border, M.D  02/06/2017

## 2017-02-07 ENCOUNTER — Ambulatory Visit: Admitting: Internal Medicine

## 2017-02-07 ENCOUNTER — Ambulatory Visit
Admission: RE | Admit: 2017-02-07 | Discharge: 2017-02-07 | Disposition: A | Discharge status: Home / Self Care | Payer: Medicare Other | Source: Ambulatory Visit | Attending: Internal Medicine | Admitting: Internal Medicine

## 2017-02-07 ENCOUNTER — Other Ambulatory Visit
Admission: RE | Admit: 2017-02-07 | Discharge: 2017-02-07 | Disposition: A | Discharge status: Home / Self Care | Payer: Medicare Other | Source: Ambulatory Visit | Attending: Internal Medicine | Admitting: Internal Medicine

## 2017-02-07 ENCOUNTER — Encounter: Payer: Self-pay | Admitting: Internal Medicine

## 2017-02-07 VITALS — BP 108/80 | HR 83 | Resp 16 | Ht 65.0 in | Wt 100.0 lb

## 2017-02-07 DIAGNOSIS — J984 Other disorders of lung: Secondary | ICD-10-CM | POA: Diagnosis not present

## 2017-02-07 DIAGNOSIS — J470 Bronchiectasis with acute lower respiratory infection: Secondary | ICD-10-CM | POA: Insufficient documentation

## 2017-02-07 DIAGNOSIS — K449 Diaphragmatic hernia without obstruction or gangrene: Secondary | ICD-10-CM | POA: Insufficient documentation

## 2017-02-07 DIAGNOSIS — I700 Atherosclerosis of aorta: Secondary | ICD-10-CM | POA: Diagnosis not present

## 2017-02-07 LAB — EXPECTORATED SPUTUM ASSESSMENT W REFEX TO RESP CULTURE

## 2017-02-07 LAB — EXPECTORATED SPUTUM ASSESSMENT W GRAM STAIN, RFLX TO RESP C

## 2017-02-07 NOTE — Patient Instructions (Addendum)
Increase nebulizer with percussion vest to three times daily.   Will check sputum culture.

## 2017-02-09 LAB — ACID FAST SMEAR (AFB)

## 2017-02-09 LAB — ACID FAST SMEAR (AFB, MYCOBACTERIA): Acid Fast Smear: NEGATIVE

## 2017-02-10 ENCOUNTER — Telehealth: Payer: Self-pay | Admitting: Internal Medicine

## 2017-02-10 LAB — CULTURE, RESPIRATORY W GRAM STAIN

## 2017-02-10 LAB — CULTURE, RESPIRATORY: CULTURE: NORMAL

## 2017-02-10 NOTE — Telephone Encounter (Signed)
Pt calling asking about culture and xray she did this week   Please call back

## 2017-02-10 NOTE — Telephone Encounter (Signed)
Called and spoke with pt. Pt is requesting sputum culture and CXR results from 02/07/17.  DR please advise. Thank you.

## 2017-02-13 NOTE — Telephone Encounter (Signed)
Can advise that the CXR was unchanged, and the cultures have not grown anything thus far.

## 2017-02-13 NOTE — Telephone Encounter (Signed)
Pt is aware of results and voiced her understanding. Nothing further is needed.  

## 2017-03-28 LAB — ACID FAST CULTURE WITH REFLEXED SENSITIVITIES: ACID FAST CULTURE - AFSCU3: NEGATIVE

## 2017-03-28 LAB — ACID FAST CULTURE WITH REFLEXED SENSITIVITIES (MYCOBACTERIA)

## 2017-04-24 NOTE — Progress Notes (Signed)
Ana [place]      Assessment and Plan:  Bronchiectasis, history of MAC infection. Now with acute exacerbation. -Patient currently has evidence of severe bronchiectasis with chronic mucous expectoration which is excessive. -Continue current medications (oral cipro, azithro), percussion vest with hypertonic saline.  -She is asked to take prednisone 10 mg daily for 1 month, as she noticed that this helped with her overall energy and appetite.  Hemoptysis. - This is due to severe bronchiectasis, with acute infection, complicated by current use of Eliquis. -We discussed possibly stopping Eliquis, she remains concerned about stroke risk and would like to continue it, therefore we decided that we could keep the Eliquis, but will stop it if she develops hemoptysis, she should remain off of it until the hemoptysis resolves.   Chronic hypoxic respiratory failure. Secondary to above. -Continue chronic oxygen therapy. -Patient has severe, end-stage disease and is at high risk of complications hospitalization and death. - current CODE STATUS is DO NOT RESUSCITATE. She is currently enrolled in hospice, and will continue to try to do therapies that will minimize her symptoms.   Hiatal hernia. -Continue omeprazole 20 mg once daily   Return in about 3 months (around 07/26/2017).  Return in about 3 months (around 07/26/2017).   Date: 04/24/2017  MRN# [id number] Ana [place] 1946/01/20  Referring Physician: Dr. Leanne [place] is a 72 y.o. old female seen in [place] for chief complaint of:    Chief Complaint  Patient presents with  . Recurrent Pneumonia    Pt has been cough up alot of bright red blood. On 3/06 she started coughing green mucus  . Atrial Fibrillation    she states this is happening regularly and she wants to know if she should continue Eliquis.    HPI:   She has a diagnosis of severe, advanced bronchiectasis with history of MAC.  She has allergies to multiple MAC medications, therefore she is on an empiric regimen, she has been started on flutter vest. She has done better with the flutter vest, but continues to have quite severe baseline dyspnea, though overall she is feeling better and has more energy. She is here with her daughter today who provides some of her history.  She is here today as follow up as she has been having hemoptysis. She is on xarelto 5 mg daily for afib. She was coughing up green mucus which is different from usual 3 weeks ago, she was put on prednisone and a "sulfur drug antibiotic". She was doing better, but on one day about 2 weeks ago she coughed up blood on one day and a week later it happened again. It was bright red on both occasions. The last occasion was about 10 days ago. Since then she has had occasional fevers, and chills.   She is doing hypertonic saline with the percussion vest twice per day, azithro daily. She is also on cipro 500 mg twice daily. They have noted that if she stops this her breathing gets worse.    No imaging available; CT chest report; 04/23/2012; chronic changes of chronic bronchitis, bronchiectasis.  I personally reviewed. Chest x-ray imaging, chest x-ray 09/19/2016; severe diffuse bronchiectasis changes, which are likely chronic.   Review and summary of outside records: -Bronchoscopy August 2016, MAC susceptibilities performed at [place] at [place] the susceptibilities are not available, respiratory culture showed gram-negative rods, fungus culture showed Cryptococcus neoformans. --culture with 5-25 colonies of MAC. Sensitivities list resistance  to clarithromycin, linezolid, moxifloxacin, and amikacin MIC of 64 (resistant). --She was seen by infectious disease service at [facility] on 10/30/2014, at that time it was recommended that she stop ciprofloxacin, start inhaled amikacin, be enrolled in pulmonary rehabilitation. --Review of records shows pt  intolerant of ethambutol and rifampin.    The patient was initially diagnosed with MAC in the early 54s. She underwent anterior segmentectomy and right middle lobectomy in 1991, resection of lingula and basilar left lower lobe in 1996. She has been intolerant of ethambutol and rifampin. She did well for several years, it has been progressively declining since around 12/2013, with particularly fast decline since summer of 2016.  Social Hx:   Social History   Tobacco Use  . Smoking status: Never Smoker  . Smokeless tobacco: Never Used  Substance Use Topics  . Alcohol use: No  . Drug use: No   Medication:    Current Outpatient Medications:  .  acetaminophen (TYLENOL) 500 MG tablet, Take 1,000 mg by mouth every 3 (three) hours., Disp: , Rfl:  .  AMBULATORY NON FORMULARY MEDICATION, Medication Name: Flutter valve use 10-15 times daily. DX:, Disp: 1 each, Rfl: 0 .  amikacin (AMIKIN) 1 GM/4ML SOLN injection, Dilute 1 milliliter of amikacin in 3 milliliters of sterile saline in nebulizer chamber once daily, Disp: , Rfl:  .  amLODipine (NORVASC) 5 MG tablet, Take 5 mg by mouth daily. take 1 tablet by mouth once daily, Disp: , Rfl:  .  apixaban (ELIQUIS) 5 MG TABS tablet, Take 5 mg by mouth 2 (two) times daily. , Disp: , Rfl:  .  Ascorbic Acid (VITAMIN C) 1000 MG tablet, Take 1,000 mg by mouth daily., Disp: , Rfl:  .  aspirin EC 81 MG tablet, Take 81 mg by mouth daily., Disp: , Rfl:  .  azithromycin (ZITHROMAX) 250 MG tablet, Take 250 mg by mouth daily. continuous, Disp: , Rfl:  .  Calcium Carbonate-Vitamin D (OYSTER SHELL/VITAMIN D) 600-125 MG-UNIT TABS, Take 1 tablet by mouth daily., Disp: , Rfl:  .  Calcium-Magnesium-Vitamin D (CALCIUM 1200+D3 PO), Take 1 tablet by mouth daily., Disp: , Rfl:  .  Cholecalciferol (VITAMIN D-1000 MAX ST) 1000 UNITS tablet, Take 1,000 Units by mouth daily. , Disp: , Rfl:  .  ciprofloxacin (CIPRO) 500 MG tablet, Take 500 mg by mouth 2 (two) times daily. continuous,  Disp: , Rfl:  .  diltiazem (CARDIZEM) 30 MG tablet, Take 30 mg by mouth 2 (two) times daily as needed (A-fib). Takes 1 tablet for heart rate over 100 and 2nd tablet 1 hour later if needed, Disp: , Rfl:  .  ferrous sulfate 325 (65 FE) MG EC tablet, Take 325 mg by mouth daily. , Disp: , Rfl:  .  LORazepam (ATIVAN) 0.5 MG tablet, TAKE 0.5-1 TABLET BY MOUTH EVERYTWICE DAILY AS NEEDED DYSPNEA, Disp: , Rfl: 2 .  losartan (COZAAR) 100 MG tablet, Take 100 mg by mouth daily. take 1 tablet by mouth once daily, Disp: , Rfl:  .  metoprolol succinate (TOPROL-XL) 50 MG 24 hr tablet, Take 50 mg by mouth daily. , Disp: , Rfl:  .  Multiple Vitamin (MULTIVITAMIN WITH MINERALS) TABS tablet, Take 1 tablet by mouth daily. Women's 50+, Disp: , Rfl:  .  omeprazole (PRILOSEC OTC) 20 MG tablet, Take 40 mg by mouth daily. TAKES 2 TABLETS, Disp: , Rfl:  .  Probiotic Product (ALIGN) 4 MG CAPS, Take 4 mg by mouth daily., Disp: , Rfl:  .  sodium chloride  0.9 % nebulizer solution, Take 3 mLs by nebulization 2 (two) times daily as needed for wheezing. Wheezing , Disp: , Rfl: 0   Allergies:  Codeine; Lactalbumin; Milk-related compounds; and Penicillins  Review of Systems: Gen:  Denies  fever, sweats, chills HEENT: Denies blurred vision, double vision. bleeds, sore throat Cvc:  No dizziness, chest pain. Resp:   Denies cough or sputum production, shortness of breath Gi: Denies swallowing difficulty, stomach pain. Gu:  Denies bladder incontinence, burning urine Ext:   No Joint pain, stiffness. Skin: No skin rash,  hives  Endoc:  No polyuria, polydipsia. Psych: No depression, insomnia. Other:  All other systems were reviewed with the patient and were negative other that what is mentioned in the HPI.   Physical Examination:   VS: BP 116/80 (BP Location: Right Arm, Cuff Size: Normal)   Pulse (!) 119   Resp 18   Ht _0  (1.651 m)   Wt 100 lb (45.4 kg)   SpO2 93%   BMI 16.64 kg/m   General Appearance: No distress    Neuro:without focal findings,  speech normal,  HEENT: PERRLA, EOM intact.   Pulmonary: normal breath sounds, No wheezing. Scattered rhonchi.  CardiovascularNormal S1,S2.  No m/r/g.   Abdomen: Benign, Soft, non-tender. Renal:  No costovertebral tenderness  GU:  No performed at this time. Endoc: No evident thyromegaly, no signs of acromegaly. Skin:   warm, no rashes, no ecchymosis  Extremities: normal, no cyanosis, clubbing.  Other findings:    LABORATORY PANEL:   CBC No results for input(s): WBC, HGB, HCT, PLT in the last 168 hours. ------------------------------------------------------------------------------------------------------------------  Chemistries  No results for input(s): NA, K, CL, CO2, GLUCOSE, BUN, CREATININE, CALCIUM, MG, AST, ALT, ALKPHOS, BILITOT in the last 168 hours.  Invalid input(s): GFRCGP ------------------------------------------------------------------------------------------------------------------  Cardiac Enzymes No results for input(s): TROPONINI in the last 168 hours. ------------------------------------------------------------  RADIOLOGY:  No results found.     Thank  you for the [place] and for allowing [facility] to assist in the care of your patient. Our recommendations are noted above.  Please contact us if we can be of further service.   Marda Stalker, MD.  Board Certified in Internal [place], Pulmonary [place], [place], and Sleep [place].  [facility] Office Number: [phone]  Patricia Pesa, M.D.  Merton Border, M.D  04/24/2017

## 2017-04-25 ENCOUNTER — Encounter: Payer: Self-pay | Admitting: Internal Medicine

## 2017-04-25 ENCOUNTER — Ambulatory Visit (INDEPENDENT_AMBULATORY_CARE_PROVIDER_SITE_OTHER): Admitting: Internal Medicine

## 2017-04-25 VITALS — BP 116/80 | HR 119 | Resp 18 | Ht 65.0 in | Wt 100.0 lb

## 2017-04-25 DIAGNOSIS — J479 Bronchiectasis, uncomplicated: Secondary | ICD-10-CM | POA: Diagnosis not present

## 2017-04-25 DIAGNOSIS — J471 Bronchiectasis with (acute) exacerbation: Secondary | ICD-10-CM | POA: Diagnosis not present

## 2017-04-25 DIAGNOSIS — J9611 Chronic respiratory failure with hypoxia: Secondary | ICD-10-CM

## 2017-04-25 DIAGNOSIS — J470 Bronchiectasis with acute lower respiratory infection: Secondary | ICD-10-CM

## 2017-04-25 MED ORDER — PREDNISONE 10 MG PO TABS: 10.0000 mg | ORAL_TABLET | Freq: Every day | ORAL | 1 refills | Status: DC

## 2017-04-25 NOTE — Patient Instructions (Addendum)
Will start prednisone 10 daily for 30 days.  When you stop the prednisone if you start to feel very fatigued/sleepy or have dizziness when standing call us immediately as this may be a sign of withdrawal from the prednisone. If you are coughing up blood, stop xarelto until the bloody cough goes away.

## 2017-05-10 ENCOUNTER — Telehealth: Payer: Self-pay | Admitting: Internal Medicine

## 2017-05-10 NOTE — Telephone Encounter (Signed)
Pt states she was prescribed Prednisone for 30 days She states her pharmacy only gave her 15 days worth She took her last pill today She is calling asking if she is to get a refill and continue this or was there a reason that she only received the 15 pills  Please call back

## 2017-05-10 NOTE — Telephone Encounter (Signed)
Callled [facility] who states hospice billing will only pay for #15 at a time. They will fill next 15. Patient has been notified. Nothing further needed.

## 2017-05-11 ENCOUNTER — Ambulatory Visit: Admitting: Internal Medicine

## 2017-06-06 ENCOUNTER — Telehealth: Payer: Self-pay | Admitting: Internal Medicine

## 2017-06-06 MED ORDER — PREDNISONE 10 MG PO TABS: 10.0000 mg | ORAL_TABLET | Freq: Every day | ORAL | 6 refills | Status: DC

## 2017-06-06 NOTE — Telephone Encounter (Signed)
Rx sent 

## 2017-06-06 NOTE — Telephone Encounter (Signed)
May refill prednisone, 10 mg daily continuous.

## 2017-06-06 NOTE — Telephone Encounter (Signed)
Pt calling asking for a refill on her Prednisone, [facility] [address] She states she is fatigue and very sleepy all the time She states "just not feeling good"   Please advise

## 2017-06-08 NOTE — Telephone Encounter (Signed)
Called and spoke with family and notified that rx sent to [facility]. They may call and have [facility] transferred [facility] or call back and we can cancel rx and resent.

## 2017-06-08 NOTE — Telephone Encounter (Signed)
Pt states her [facility] not [facility] [address]. She states [facility] [place]

## 2017-07-19 NOTE — Progress Notes (Deleted)
[facility]     Assessment and Plan:  Bronchiectasis, history of MAC infection. Now with acute exacerbation. -Patient currently has evidence of severe bronchiectasis with chronic mucous expectoration which is excessive. -Continue current medications (oral cipro, azithro), percussion vest with hypertonic saline.  -She is asked to take prednisone 10 mg daily for 1 month, as she noticed that this helped with her overall energy and appetite.  Hemoptysis. - This is due to severe bronchiectasis, with acute infection, complicated by current use of Eliquis. -We discussed possibly stopping Eliquis, she remains concerned about stroke risk and would like to continue it, therefore we decided that we could keep the Eliquis, but will stop it if she develops hemoptysis, she should remain off of it until the hemoptysis resolves.   Chronic hypoxic respiratory failure. Secondary to above. -Continue chronic oxygen therapy. -Patient has severe, end-stage disease and is at high risk of complications hospitalization and death. - current CODE STATUS is DO NOT RESUSCITATE. She is currently enrolled in hospice, and will continue to try to do therapies that will minimize her symptoms.   Hiatal hernia. -Continue omeprazole 20 mg once daily   No follow-ups on file.  No follow-ups on file.   Date: 07/19/2017  MRN# [id number] Ana Washington 1945/06/15  Referring Physician: Dr. [name] is a 72 y.o. old female seen in consultation for chief complaint of:    No chief complaint on file.   Synopsis: She has a diagnosis of severe, advanced bronchiectasis with history of MAC. She has allergies to multiple MAC medications, therefore she is on an empiric regimen.  HPI: She has been maintained on cipro, azithro, percussion vest, hypertonic saline, flutter valve and prednisone 10 mg daily.   She is doing hypertonic saline with the percussion vest twice per day, azithro daily.  She is also on cipro 500 mg twice daily. They have noted that if she stops this her breathing gets worse.    CT chest report; 04/23/2012; chronic changes of chronic bronchitis, bronchiectasis. Chest x-ray imaging, chest x-ray 09/19/2016; severe diffuse bronchiectasis changes, which are likely chronic.   Review and summary of outside records: -Bronchoscopy August 2016, MAC susceptibilities performed at [facility] at [place] the susceptibilities are not available, respiratory culture showed gram-negative rods, fungus culture showed Cryptococcus neoformans. --culture with 5-25 colonies of MAC. Sensitivities list resistance to clarithromycin, linezolid, moxifloxacin, and amikacin MIC of 64 (resistant). --She was seen by infectious disease service at [facility] on 10/30/2014, at that time it was recommended that she stop ciprofloxacin, start inhaled amikacin, be enrolled in pulmonary rehabilitation. --Review of records shows pt intolerant of ethambutol and rifampin.    The patient was initially diagnosed with MAC in the early 47s. She underwent anterior segmentectomy and right middle lobectomy in 1991, resection of lingula and basilar left lower lobe in 1996. She has been intolerant of ethambutol and rifampin. She did well for several years, it has been progressively declining since around 12/2013, with particularly fast decline since summer of 2016.  Social Hx:   Social History   Tobacco Use  . Smoking status: Never Smoker  . Smokeless tobacco: Never Used  Substance Use Topics  . Alcohol use: No  . Drug use: No   Medication:    Current Outpatient Medications:  .  acetaminophen (TYLENOL) 500 MG tablet, Take 1,000 mg by mouth every 3 (three) hours., Disp: , Rfl:  .  AMBULATORY NON FORMULARY MEDICATION, Medication Name: Flutter  valve use 10-15 times daily. DX:, Disp: 1 each, Rfl: 0 .  amLODipine (NORVASC) 5 MG tablet, Take 5 mg by mouth daily. take 1 tablet by mouth once daily,  Disp: , Rfl:  .  apixaban (ELIQUIS) 5 MG TABS tablet, Take 5 mg by mouth 2 (two) times daily. , Disp: , Rfl:  .  Ascorbic Acid (VITAMIN C) 1000 MG tablet, Take 1,000 mg by mouth daily., Disp: , Rfl:  .  aspirin EC 81 MG tablet, Take 81 mg by mouth daily., Disp: , Rfl:  .  azithromycin (ZITHROMAX) 250 MG tablet, Take 250 mg by mouth daily. continuous, Disp: , Rfl:  .  Calcium Carbonate-Vitamin D (OYSTER SHELL/VITAMIN D) 600-125 MG-UNIT TABS, Take 1 tablet by mouth daily., Disp: , Rfl:  .  Calcium-Magnesium-Vitamin D (CALCIUM 1200+D3 PO), Take 1 tablet by mouth daily., Disp: , Rfl:  .  Cholecalciferol (VITAMIN D-1000 MAX ST) 1000 UNITS tablet, Take 1,000 Units by mouth daily. , Disp: , Rfl:  .  ciprofloxacin (CIPRO) 500 MG tablet, Take 500 mg by mouth 2 (two) times daily. continuous, Disp: , Rfl:  .  diltiazem (CARDIZEM) 30 MG tablet, Take 30 mg by mouth 2 (two) times daily as needed (A-fib). Takes 1 tablet for heart rate over 100 and 2nd tablet 1 hour later if needed, Disp: , Rfl:  .  ferrous sulfate 325 (65 FE) MG EC tablet, Take 325 mg by mouth daily. , Disp: , Rfl:  .  LORazepam (ATIVAN) 0.5 MG tablet, TAKE 0.5-1 TABLET BY MOUTH EVERYTWICE DAILY AS NEEDED DYSPNEA, Disp: , Rfl: 2 .  losartan (COZAAR) 100 MG tablet, Take 100 mg by mouth daily. take 1 tablet by mouth once daily, Disp: , Rfl:  .  metoprolol succinate (TOPROL-XL) 50 MG 24 hr tablet, Take 50 mg by mouth daily. , Disp: , Rfl:  .  Multiple Vitamin (MULTIVITAMIN WITH MINERALS) TABS tablet, Take 1 tablet by mouth daily. Women's 50+, Disp: , Rfl:  .  omeprazole (PRILOSEC OTC) 20 MG tablet, Take 40 mg by mouth daily. TAKES 2 TABLETS, Disp: , Rfl:  .  predniSONE (DELTASONE) 10 MG tablet, Take 1 tablet (10 mg total) by mouth daily with breakfast., Disp: 30 tablet, Rfl: 6 .  Probiotic Product (ALIGN) 4 MG CAPS, Take 4 mg by mouth daily., Disp: , Rfl:  .  sodium chloride 0.9 % nebulizer solution, Take 3 mLs by nebulization 2 (two) times daily as  needed for wheezing. Wheezing , Disp: , Rfl: 0   Allergies:  Codeine; Lactalbumin; Milk-related compounds; and Penicillins  Review of Systems:   Physical Examination:   VS: There were no vitals taken for this visit.    Other findings:    LABORATORY PANEL:   CBC No results for input(s): WBC, HGB, HCT, PLT in the last 168 hours. ------------------------------------------------------------------------------------------------------------------  Chemistries  No results for input(s): NA, K, CL, CO2, GLUCOSE, BUN, CREATININE, CALCIUM, MG, AST, ALT, ALKPHOS, BILITOT in the last 168 hours.  Invalid input(s): GFRCGP ------------------------------------------------------------------------------------------------------------------  Cardiac Enzymes No results for input(s): TROPONINI in the last 168 hours. ------------------------------------------------------------  RADIOLOGY:  No results found.     Thank  you for the consultation and for allowing [facility] to assist in the care of your patient. Our recommendations are noted above.  Please contact us if we can be of further service.   Marda Stalker, MD.  Board Certified in Internal Medicine, Pulmonary Medicine, [place], and Sleep Medicine.  [facility] Office Number: [phone]  Patricia Pesa, M.D.  [place]erton Border, M.D  07/19/2017

## 2017-07-24 ENCOUNTER — Ambulatory Visit: Admitting: Internal Medicine

## 2017-07-30 ENCOUNTER — Emergency Department: Payer: Medicare Other

## 2017-07-30 ENCOUNTER — Other Ambulatory Visit: Payer: Self-pay

## 2017-07-30 ENCOUNTER — Inpatient Hospital Stay
Admission: EM | Admit: 2017-07-30 | Discharge: 2017-08-03 | DRG: 535 | Disposition: A | Discharge status: Hospice | Payer: Medicare Other | Attending: Internal Medicine | Admitting: Internal Medicine

## 2017-07-30 DIAGNOSIS — I10 Essential (primary) hypertension: Secondary | ICD-10-CM | POA: Diagnosis present

## 2017-07-30 DIAGNOSIS — R0600 Dyspnea, unspecified: Secondary | ICD-10-CM

## 2017-07-30 DIAGNOSIS — Z79899 Other long term (current) drug therapy: Secondary | ICD-10-CM | POA: Diagnosis not present

## 2017-07-30 DIAGNOSIS — J9611 Chronic respiratory failure with hypoxia: Secondary | ICD-10-CM | POA: Diagnosis present

## 2017-07-30 DIAGNOSIS — I2510 Atherosclerotic heart disease of native coronary artery without angina pectoris: Secondary | ICD-10-CM | POA: Diagnosis present

## 2017-07-30 DIAGNOSIS — Z8249 Family history of ischemic heart disease and other diseases of the circulatory system: Secondary | ICD-10-CM

## 2017-07-30 DIAGNOSIS — Z91018 Allergy to other foods: Secondary | ICD-10-CM

## 2017-07-30 DIAGNOSIS — Z681 Body mass index (BMI) 19 or less, adult: Secondary | ICD-10-CM | POA: Diagnosis not present

## 2017-07-30 DIAGNOSIS — R300 Dysuria: Secondary | ICD-10-CM | POA: Diagnosis present

## 2017-07-30 DIAGNOSIS — I252 Old myocardial infarction: Secondary | ICD-10-CM | POA: Diagnosis not present

## 2017-07-30 DIAGNOSIS — Z885 Allergy status to narcotic agent status: Secondary | ICD-10-CM | POA: Diagnosis not present

## 2017-07-30 DIAGNOSIS — M1990 Unspecified osteoarthritis, unspecified site: Secondary | ICD-10-CM | POA: Diagnosis present

## 2017-07-30 DIAGNOSIS — I480 Paroxysmal atrial fibrillation: Secondary | ICD-10-CM | POA: Diagnosis present

## 2017-07-30 DIAGNOSIS — Z7952 Long term (current) use of systemic steroids: Secondary | ICD-10-CM | POA: Diagnosis not present

## 2017-07-30 DIAGNOSIS — K219 Gastro-esophageal reflux disease without esophagitis: Secondary | ICD-10-CM | POA: Diagnosis present

## 2017-07-30 DIAGNOSIS — Z66 Do not resuscitate: Secondary | ICD-10-CM | POA: Diagnosis present

## 2017-07-30 DIAGNOSIS — Z7901 Long term (current) use of anticoagulants: Secondary | ICD-10-CM | POA: Diagnosis not present

## 2017-07-30 DIAGNOSIS — Z888 Allergy status to other drugs, medicaments and biological substances status: Secondary | ICD-10-CM

## 2017-07-30 DIAGNOSIS — J189 Pneumonia, unspecified organism: Secondary | ICD-10-CM | POA: Diagnosis present

## 2017-07-30 DIAGNOSIS — S3282XA Multiple fractures of pelvis without disruption of pelvic ring, initial encounter for closed fracture: Secondary | ICD-10-CM

## 2017-07-30 DIAGNOSIS — J984 Other disorders of lung: Secondary | ICD-10-CM | POA: Diagnosis present

## 2017-07-30 DIAGNOSIS — J470 Bronchiectasis with acute lower respiratory infection: Secondary | ICD-10-CM | POA: Diagnosis present

## 2017-07-30 DIAGNOSIS — E43 Unspecified severe protein-calorie malnutrition: Secondary | ICD-10-CM | POA: Diagnosis present

## 2017-07-30 DIAGNOSIS — S42401A Unspecified fracture of lower end of right humerus, initial encounter for closed fracture: Secondary | ICD-10-CM | POA: Diagnosis present

## 2017-07-30 DIAGNOSIS — Z818 Family history of other mental and behavioral disorders: Secondary | ICD-10-CM | POA: Diagnosis not present

## 2017-07-30 DIAGNOSIS — W1789XA Other fall from one level to another, initial encounter: Secondary | ICD-10-CM | POA: Diagnosis present

## 2017-07-30 DIAGNOSIS — S32591A Other specified fracture of right pubis, initial encounter for closed fracture: Secondary | ICD-10-CM | POA: Diagnosis present

## 2017-07-30 DIAGNOSIS — J471 Bronchiectasis with (acute) exacerbation: Secondary | ICD-10-CM | POA: Diagnosis not present

## 2017-07-30 DIAGNOSIS — Z7982 Long term (current) use of aspirin: Secondary | ICD-10-CM

## 2017-07-30 DIAGNOSIS — Z9981 Dependence on supplemental oxygen: Secondary | ICD-10-CM | POA: Diagnosis not present

## 2017-07-30 DIAGNOSIS — E440 Moderate protein-calorie malnutrition: Secondary | ICD-10-CM

## 2017-07-30 DIAGNOSIS — S329XXA Fracture of unspecified parts of lumbosacral spine and pelvis, initial encounter for closed fracture: Secondary | ICD-10-CM | POA: Diagnosis present

## 2017-07-30 HISTORY — DX: Atherosclerotic heart disease of native coronary artery without angina pectoris: I25.10

## 2017-07-30 HISTORY — DX: Unspecified atrial fibrillation: I48.91

## 2017-07-30 LAB — CBC
HCT: 34.8 % — ABNORMAL LOW (ref 35.0–47.0)
Hemoglobin: 11.9 g/dL — ABNORMAL LOW (ref 12.0–16.0)
MCH: 33.6 pg (ref 26.0–34.0)
MCHC: 34.1 g/dL (ref 32.0–36.0)
MCV: 98.6 fL (ref 80.0–100.0)
Platelets: 333 10*3/uL (ref 150–440)
RBC: 3.53 MIL/uL — AB (ref 3.80–5.20)
RDW: 12.6 % (ref 11.5–14.5)
WBC: 22.5 10*3/uL — ABNORMAL HIGH (ref 3.6–11.0)

## 2017-07-30 MED ORDER — LORAZEPAM 0.5 MG PO TABS
0.5000 mg | ORAL_TABLET | Freq: Once | ORAL | Status: AC
  Administered 2017-07-30: 0.5 mg via ORAL
  Filled 2017-07-30: qty 1

## 2017-07-30 MED ORDER — ONDANSETRON 4 MG PO TBDP
ORAL_TABLET | ORAL | Status: AC
  Filled 2017-07-30: qty 1

## 2017-07-30 MED ORDER — ONDANSETRON 4 MG PO TBDP
4.0000 mg | ORAL_TABLET | Freq: Once | ORAL | Status: AC
  Administered 2017-07-30: 4 mg via ORAL

## 2017-07-30 MED ORDER — ACETAMINOPHEN 500 MG PO TABS
1000.0000 mg | ORAL_TABLET | Freq: Once | ORAL | Status: AC
  Administered 2017-07-30: 1000 mg via ORAL
  Filled 2017-07-30: qty 2

## 2017-07-30 MED ORDER — OXYCODONE HCL 5 MG PO TABS
5.0000 mg | ORAL_TABLET | Freq: Once | ORAL | Status: AC
  Administered 2017-07-30: 5 mg via ORAL
  Filled 2017-07-30: qty 1

## 2017-07-30 NOTE — ED Provider Notes (Signed)
[facility] Emergency Department Provider Note  ____________________________________________  Time seen: Approximately 10:44 PM  I have reviewed the triage vital signs and the nursing notes.   HISTORY  Chief Complaint Fall   HPI Ana Washington is a 72 y.o. female with history of arthritis who presents for evaluation of right hip and elbow pain status post mechanical fall.  Patient reports that she was about to sit on her bed when she miscalculated the distance and slipped off a van onto the floor.  She fell onto her right side.  She was complaining of pain in her right pelvis region her right elbow which have been present since the fall.  The pain is worse in the posterior pelvis region constant and worse with movement. The pain is mild but becomes moderate with weight bearing. She was unable to walk.  She denies head trauma or LOC.  She is on Eliquis.  She denies back pain or neck pain.  Past Medical History:  Diagnosis Date  . Arthritis   . Bronchiectasis ([place])   . Dyspnea   . 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 GERD (gastroesophageal reflux disease)   . Hypertension   . Lung disease   . Myocardial infarction ([place])   . [name] (postoperative nausea and vomiting)     Patient Active Problem List   Diagnosis Date Noted  . 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 Primary osteoarthritis of both knees 10/03/2013    Past Surgical History:  Procedure Laterality Date  . BACK SURGERY    . CHOLECYSTECTOMY    . FOOT SURGERY    . KYPHOPLASTY N/A 07/05/2016   Procedure: KYPHOPLASTY T - 9;  Surgeon: Hessie Knows, MD;  Location: [facility] ORS;  Service: Orthopedics;  Laterality: N/A;  . LUNG SURGERY  1990 and 1996    Prior to Admission medications   Medication Sig Start Date End Date Taking? Authorizing Provider  acetaminophen (TYLENOL) 500 MG tablet Take 1,000 mg by mouth every 3 (three) hours.    [provider]  AMBULATORY NON  FORMULARY MEDICATION Medication Name: Flutter valve use 10-15 times daily. DX: 11/15/16   Laverle Hobby, MD  amLODipine (NORVASC) 5 MG tablet Take 5 mg by mouth daily. take 1 tablet by mouth once daily 11/04/13   [provider]  apixaban (ELIQUIS) 5 MG TABS tablet Take 5 mg by mouth 2 (two) times daily.  04/08/16 04/25/17  [provider]  Ascorbic Acid (VITAMIN C) 1000 MG tablet Take 1,000 mg by mouth daily.    [provider]  aspirin EC 81 MG tablet Take 81 mg by mouth daily.    [provider]  azithromycin (ZITHROMAX) 250 MG tablet Take 250 mg by mouth daily. continuous 09/09/11   [provider]  Calcium Carbonate-Vitamin D (OYSTER SHELL/VITAMIN D) 600-125 MG-UNIT TABS Take 1 tablet by mouth daily. 11/05/07   [provider]  Calcium-Magnesium-Vitamin D (CALCIUM 1200+D3 PO) Take 1 tablet by mouth daily.    [provider]  Cholecalciferol (VITAMIN D-1000 MAX ST) 1000 UNITS tablet Take 1,000 Units by mouth daily.     [provider]  ciprofloxacin (CIPRO) 500 MG tablet Take 500 mg by mouth 2 (two) times daily. continuous 10/21/13   [provider]  diltiazem (CARDIZEM) 30 MG tablet Take 30 mg by mouth 2 (two) times daily as needed (A-fib). Takes 1 tablet for heart rate over 100 and 2nd tablet 1 hour later if needed    [provider]  ferrous sulfate 325 (65 FE) MG EC tablet Take 325 mg by mouth daily.     [provider]  LORazepam (ATIVAN) 0.5 MG tablet TAKE 0.5-1 TABLET BY MOUTH EVERYTWICE DAILY AS NEEDED DYSPNEA 05/06/16   [provider]  losartan (COZAAR) 100 MG tablet Take 100 mg by mouth daily. take 1 tablet by mouth once daily 07/10/13   [provider]  metoprolol succinate (TOPROL-XL) 50 MG 24 hr tablet Take 50 mg by mouth daily.  10/17/13   [provider]  Multiple Vitamin (MULTIVITAMIN WITH MINERALS) TABS tablet Take 1 tablet by mouth daily. Women's 50+     [provider]  omeprazole (PRILOSEC OTC) 20 MG tablet Take 40 mg by mouth daily. TAKES 2 TABLETS    [provider]  predniSONE (DELTASONE) 10 MG tablet Take 1 tablet (10 mg total) by mouth daily with breakfast. 06/06/17   Laverle Hobby, MD  Probiotic Product (ALIGN) 4 MG CAPS Take 4 mg by mouth daily.    [provider]  sodium chloride 0.9 % nebulizer solution Take 3 mLs by nebulization 2 (two) times daily as needed for wheezing. Wheezing  06/07/16   [provider]    Allergies Codeine; Lactalbumin; Milk-related compounds; and Penicillins  Family History  Problem Relation Age of Onset  . Hypertension Mother   . Hypertension Father     Social History Social History   Tobacco Use  . Smoking status: Never Smoker  . Smokeless tobacco: Never Used  Substance Use Topics  . Alcohol use: No  . Drug use: No    Review of Systems Constitutional: Negative for fever. Eyes: Negative for visual changes. ENT: Negative for facial injury or neck injury Cardiovascular: Negative for chest injury. Respiratory: Negative for shortness of breath. Negative for chest wall injury. Gastrointestinal: Negative for abdominal pain or injury. Genitourinary: Negative for dysuria. Musculoskeletal: Negative for back injury. + R pelvis pain and R elbow pain. Skin: Negative for laceration/abrasions. Neurological: Negative for head injury.  ____________________________________________   PHYSICAL EXAM:  VITAL SIGNS: Vitals:   07/30/17 2230 07/30/17 2301  BP: (!) 164/99   Pulse: 89   Temp:  98.6 F (37 C)  SpO2: 95%    Full spinal precautions maintained throughout the trauma exam. Constitutional: Alert and oriented. No acute distress. Does not appear intoxicated. HEENT Head: Normocephalic and atraumatic. Face: No facial bony tenderness. Stable midface Ears: No hemotympanum bilaterally. No Battle sign Eyes: No eye injury. PERRL. No raccoon eyes Nose:  Nontender. No epistaxis. No rhinorrhea Mouth/Throat: Mucous membranes are moist. No oropharyngeal blood. No dental injury. Airway patent without stridor. Normal voice. Neck: no C-collar in place. No midline c-spine tenderness.  Cardiovascular: Normal rate, regular rhythm. Normal and symmetric distal pulses are present in all extremities. Pulmonary/Chest: Chest wall is stable and nontender to palpation/compression. Normal respiratory effort. Breath sounds are normal. No crepitus.  Abdominal: Soft, nontender, non distended. Musculoskeletal: Bruise seen on the R elbow with significant ttp. Nontender with normal full range of motion in all extremities. No deformities. No thoracic or lumbar midline spinal tenderness. Pelvis is stable. Patient is tender to palpation over the posterior pelvic girdle on the R Skin: Skin is warm, dry and intact. No abrasions or contutions. Psychiatric: Speech and behavior are appropriate. Neurological: Normal speech and language. Moves all extremities to command. No gross focal neurologic deficits are appreciated.  Glascow Coma Score: 4 - Opens eyes on own 6 - Follows simple motor commands 5 - Alert  and oriented GCS: 15  ____________________________________________   LABS (all labs ordered are listed, but only abnormal results are displayed)  Labs Reviewed  CBC  BASIC METABOLIC PANEL   ____________________________________________  EKG  none  ____________________________________________  RADIOLOGY  I have personally reviewed the images performed during this visit and I agree with the Radiologist's read.   Interpretation by Radiologist:  Dg Elbow Complete Right  Result Date: 07/30/2017 CLINICAL DATA:  72 year old female with fall and right elbow pain. EXAM: RIGHT ELBOW - COMPLETE 3+ VIEW COMPARISON:  None. FINDINGS: No definite acute fracture identified. A faint linear lucency through the medial epicondyle of the humerus is likely chronic and less  likely represent a nondisplaced fracture. The bones are osteopenic. There is elevation of the anterior fat pad representing joint effusion. An occult fracture primarily involving the radial head is not entirely excluded. The soft tissues are grossly unremarkable. IMPRESSION: No definite acute fracture or dislocation. There is moderate joint effusion and therefore an occult fracture is not entirely excluded. Electronically Signed   By: Anner Crete M.D.   On: 07/30/2017 22:46   Dg Hip Unilat W Or Wo Pelvis 2-3 Views Right  Result Date: 07/30/2017 CLINICAL DATA:  Ground level fall EXAM: DG HIP (WITH OR WITHOUT PELVIS) 2-3V RIGHT COMPARISON:  None. FINDINGS: SI joint degenerative changes. Probable acute minimally displaced fracture involving the right superior pubic ramus. Questionable nondisplaced right inferior pubic ramus fracture on the lateral view of the right hip. Right femoral head demonstrates normal alignment IMPRESSION: 1. Suspected acute fractures of the right superior and inferior pubic rami. Electronically Signed   By: Donavan Foil M.D.   On: 07/30/2017 22:46     ____________________________________________   PROCEDURES  Procedure(s) performed: None Procedures Critical Care performed:  None ____________________________________________   INITIAL IMPRESSION / ASSESSMENT AND PLAN / ED COURSE   72 y.o. female with history of arthritis who presents for evaluation of right hip and elbow pain status post mechanical fall of right pelvis pain and right elbow pain.  X-ray consistent with acute fractures of the right superior and inferior pubic rami and moderate joint effusion of the right elbow which is very tender to palpation concerning for an occult fracture.  Will place patient on a posterior slab above the elbow.  Head CT was ordered since patient is on Eliquis.  Patient has refused a head CT because she did not hit her head.  I explained to her that a whiplash movement of the head on  Eliquis may cause a head bleed.  She understands that but continues to refuse a head CT.  Now patient is unable to use her dominant hand to use a walker for ambulation and also has pelvis fractures therefore patient will be admitted for pain control, physical therapy, and possible rehab placement.      As part of my medical decision making, I reviewed the following data within the [facility] notes reviewed and incorporated, Radiograph reviewed , Discussed with admitting physician , Notes from prior ED visits and [place] Controlled Substance Database    Pertinent labs & imaging results that were available during my care of the patient were reviewed by me and considered in my medical decision making (see chart for details).    ____________________________________________   FINAL CLINICAL IMPRESSION(S) / ED DIAGNOSES  Final diagnoses:  Multiple closed fractures of pelvis, initial encounter ([place])  Closed fracture of right elbow, initial encounter      [place]  DURING THIS VISIT:  ED Discharge Orders    None       Note:  This document was prepared using Dragon voice recognition software and may include unintentional dictation errors.    Alfred Levins, [place], MD 07/30/17 [phone]

## 2017-07-30 NOTE — H&P (Signed)
[facility] NAME: Ana Washington    MR#:  [id number]  DATE OF BIRTH:  1945/06/25  DATE OF ADMISSION:  07/30/2017  PRIMARY CARE PHYSICIAN: [name], MD   REQUESTING/REFERRING PHYSICIAN: Alfred Levins, MD  CHIEF COMPLAINT:   Chief Complaint  Patient presents with  . Fall    HISTORY OF PRESENT ILLNESS:  Ana Washington  is a 72 y.o. female who presents with mechanical fall at home and subsequent pelvic fracture.  Patient also fell onto her right elbow, though x-rays tonight could not determine with certainty whether she had a fracture there or not.  Patient typically ambulates at home with a walker, but is currently unable to do so.  Hospitalist called for admission  PAST MEDICAL HISTORY:   Past Medical History:  Diagnosis Date  . Arthritis   . Atrial fibrillation ([place])   . Bronchiectasis ([place])   . CAD (coronary artery disease)   . CAD (coronary artery disease) 07/30/2017  . Dyspnea   . 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 GERD (gastroesophageal reflux disease)   . Hypertension   . Lung disease   . Myocardial infarction ([place])   . [name] (postoperative nausea and vomiting)      PAST SURGICAL HISTORY:   Past Surgical History:  Procedure Laterality Date  . BACK SURGERY    . CHOLECYSTECTOMY    . FOOT SURGERY    . KYPHOPLASTY N/A 07/05/2016   Procedure: KYPHOPLASTY T - 9;  Surgeon: Hessie Knows, MD;  Location: [facility] ORS;  Service: Orthopedics;  Laterality: N/A;  . LUNG SURGERY  1990 and 1996     SOCIAL HISTORY:   Social History   Tobacco Use  . Smoking status: Never Smoker  . Smokeless tobacco: Never Used  Substance Use Topics  . Alcohol use: No     FAMILY HISTORY:   Family History  Problem Relation Age of Onset  . Hypertension Mother   . Hypertension Father      DRUG ALLERGIES:   Allergies  Allergen Reactions  . Codeine Nausea And Vomiting  . Lactalbumin Diarrhea  . Milk-Related  Compounds Diarrhea  . Penicillins Rash    Has patient had a PCN reaction causing immediate rash, facial/tongue/throat swelling, SOB or lightheadedness with hypotension: Unknown Has patient had a PCN reaction causing severe rash involving mucus membranes or skin necrosis: Unknown Has patient had a PCN reaction that required hospitalization: Unknown Has patient had a PCN reaction occurring within the last 10 years: No If all of the above answers are "NO", then may proceed with Cephalosporin use.     MEDICATIONS AT HOME:   Prior to Admission medications   Medication Sig Start Date End Date Taking? Authorizing Provider  acetaminophen (TYLENOL) 500 MG tablet Take 1,000 mg by mouth every 3 (three) hours.    [provider]  AMBULATORY NON FORMULARY MEDICATION Medication Name: Flutter valve use 10-15 times daily. DX: 11/15/16   Laverle Hobby, MD  amLODipine (NORVASC) 5 MG tablet Take 5 mg by mouth daily. take 1 tablet by mouth once daily 11/04/13   [provider]  apixaban (ELIQUIS) 5 MG TABS tablet Take 5 mg by mouth 2 (two) times daily.  04/08/16 04/25/17  [provider]  Ascorbic Acid (VITAMIN C) 1000 MG tablet Take 1,000 mg by mouth daily.    [provider]  aspirin EC 81 MG tablet Take 81 mg by mouth daily.    [provider]  azithromycin (  ZITHROMAX) 250 MG tablet Take 250 mg by mouth daily. continuous 09/09/11   [provider]  Calcium Carbonate-Vitamin D (OYSTER SHELL/VITAMIN D) 600-125 MG-UNIT TABS Take 1 tablet by mouth daily. 11/05/07   [provider]  Calcium-Magnesium-Vitamin D (CALCIUM 1200+D3 PO) Take 1 tablet by mouth daily.    [provider]  Cholecalciferol (VITAMIN D-1000 MAX ST) 1000 UNITS tablet Take 1,000 Units by mouth daily.     [provider]  ciprofloxacin (CIPRO) 500 MG tablet Take 500 mg by mouth 2 (two) times daily. continuous 10/21/13   [provider]  diltiazem  (CARDIZEM) 30 MG tablet Take 30 mg by mouth 2 (two) times daily as needed (A-fib). Takes 1 tablet for heart rate over 100 and 2nd tablet 1 hour later if needed    [provider]  ferrous sulfate 325 (65 FE) MG EC tablet Take 325 mg by mouth daily.     [provider]  LORazepam (ATIVAN) 0.5 MG tablet TAKE 0.5-1 TABLET BY MOUTH EVERYTWICE DAILY AS NEEDED DYSPNEA 05/06/16   [provider]  losartan (COZAAR) 100 MG tablet Take 100 mg by mouth daily. take 1 tablet by mouth once daily 07/10/13   [provider]  metoprolol succinate (TOPROL-XL) 50 MG 24 hr tablet Take 50 mg by mouth daily.  10/17/13   [provider]  Multiple Vitamin (MULTIVITAMIN WITH MINERALS) TABS tablet Take 1 tablet by mouth daily. Women's 50+    [provider]  omeprazole (PRILOSEC OTC) 20 MG tablet Take 40 mg by mouth daily. TAKES 2 TABLETS    [provider]  predniSONE (DELTASONE) 10 MG tablet Take 1 tablet (10 mg total) by mouth daily with breakfast. 06/06/17   Laverle Hobby, MD  Probiotic Product (ALIGN) 4 MG CAPS Take 4 mg by mouth daily.    [provider]  sodium chloride 0.9 % nebulizer solution Take 3 mLs by nebulization 2 (two) times daily as needed for wheezing. Wheezing  06/07/16   [provider]    REVIEW OF SYSTEMS:  Review of Systems  Constitutional: Negative for chills, fever, malaise/fatigue and weight loss.  HENT: Negative for ear pain, hearing loss and tinnitus.   Eyes: Negative for blurred vision, double vision, pain and redness.  Respiratory: Negative for cough, hemoptysis and shortness of breath.   Cardiovascular: Negative for chest pain, palpitations, orthopnea and leg swelling.  Gastrointestinal: Negative for abdominal pain, constipation, diarrhea, nausea and vomiting.  Genitourinary: Negative for dysuria, frequency and hematuria.  Musculoskeletal: Positive for falls and joint pain (Pelvis and right elbow). Negative  for back pain and neck pain.  Skin:       No acne, rash, or lesions  Neurological: Negative for dizziness, tremors, focal weakness and weakness.  Endo/Heme/Allergies: Negative for polydipsia. Does not bruise/bleed easily.  Psychiatric/Behavioral: Negative for depression. The patient is not nervous/anxious and does not have insomnia.      VITAL SIGNS:   Vitals:   07/30/17 2211 07/30/17 2230 07/30/17 2301  BP:  (!) 164/99   Pulse:  89   Temp:   98.6 F (37 C)  TempSrc:   Oral  SpO2:  95%   Weight: 45.4 kg (100 lb)    Height: 5\' 5"  (1.651 m)     Wt Readings from Last 3 Encounters:  07/30/17 45.4 kg (100 lb)  04/25/17 45.4 kg (100 lb)  02/07/17 45.4 kg (100 lb)    PHYSICAL EXAMINATION:  Physical Exam  Vitals reviewed. Constitutional: She is  oriented to person, place, and time. She appears well-developed and well-nourished. No distress.  HENT:  Head: Normocephalic and atraumatic.  Mouth/Throat: Oropharynx is clear and moist.  Eyes: Pupils are equal, round, and reactive to light. Conjunctivae and EOM are normal. No scleral icterus.  Neck: Normal range of motion. Neck supple. No JVD present. No thyromegaly present.  Cardiovascular: Normal rate, regular rhythm and intact distal pulses. Exam reveals no gallop and no friction rub.  No murmur heard. Respiratory: Effort normal and breath sounds normal. No respiratory distress. She has no wheezes. She has no rales.  GI: Soft. Bowel sounds are normal. She exhibits no distension. There is no tenderness.  Musculoskeletal: Normal range of motion. She exhibits tenderness (Pelvis and right elbow). She exhibits no edema.  No arthritis, no gout  Lymphadenopathy:    She has no cervical adenopathy.  Neurological: She is alert and oriented to person, place, and time. No cranial nerve deficit.  No dysarthria, no aphasia  Skin: Skin is warm and dry. No rash noted. No erythema.  Psychiatric: She has a normal mood and affect. Her behavior is  normal. Judgment and thought content normal.    LABORATORY PANEL:   CBC No results for input(s): WBC, HGB, HCT, PLT in the last 168 hours. ------------------------------------------------------------------------------------------------------------------  Chemistries  No results for input(s): NA, K, CL, CO2, GLUCOSE, BUN, CREATININE, CALCIUM, MG, AST, ALT, ALKPHOS, BILITOT in the last 168 hours.  Invalid input(s): GFRCGP ------------------------------------------------------------------------------------------------------------------  Cardiac Enzymes No results for input(s): TROPONINI in the last 168 hours. ------------------------------------------------------------------------------------------------------------------  RADIOLOGY:  Dg Elbow Complete Right  Result Date: 07/30/2017 CLINICAL DATA:  72 year old female with fall and right elbow pain. EXAM: RIGHT ELBOW - COMPLETE 3+ VIEW COMPARISON:  None. FINDINGS: No definite acute fracture identified. A faint linear lucency through the medial epicondyle of the humerus is likely chronic and less likely represent a nondisplaced fracture. The bones are osteopenic. There is elevation of the anterior fat pad representing joint effusion. An occult fracture primarily involving the radial head is not entirely excluded. The soft tissues are grossly unremarkable. IMPRESSION: No definite acute fracture or dislocation. There is moderate joint effusion and therefore an occult fracture is not entirely excluded. Electronically Signed   By: Anner Crete M.D.   On: 07/30/2017 22:46   Dg Hip Unilat W Or Wo Pelvis 2-3 Views Right  Result Date: 07/30/2017 CLINICAL DATA:  Ground level fall EXAM: DG HIP (WITH OR WITHOUT PELVIS) 2-3V RIGHT COMPARISON:  None. FINDINGS: SI joint degenerative changes. Probable acute minimally displaced fracture involving the right superior pubic ramus. Questionable nondisplaced right inferior pubic ramus fracture on the lateral  view of the right hip. Right femoral head demonstrates normal alignment IMPRESSION: 1. Suspected acute fractures of the right superior and inferior pubic rami. Electronically Signed   By: Donavan Foil M.D.   On: 07/30/2017 22:46    EKG:   Orders placed or performed during the hospital encounter of 07/04/16  . EKG 12 lead  . EKG 12 lead    IMPRESSION AND PLAN:  Principal Problem:   Pelvic fracture (HCC) -due to mechanical fall, will get PT and OT consults, PRN analgesia, orthopedic consult Active Problems:   Essential hypertension -continue home meds   PAF (paroxysmal atrial fibrillation) ([place]) -continue home rate controlling medications and anticoagulation   CAD (coronary artery disease) -continue home medications   GERD (gastroesophageal reflux disease) -home dose PPI  Chart review performed and case discussed with ED provider. Labs, imaging and/or ECG reviewed  by provider and discussed with patient/family. Management plans discussed with the patient and/or family.  DVT PROPHYLAXIS: Systemic anticoagulation  GI PROPHYLAXIS: PPI  ADMISSION STATUS: Inpatient  CODE STATUS: Full Code Status History    Date Active Date Inactive Code Status Order ID Comments User Context   07/05/2016 0931 07/05/2016 1311 Full Code [id number]  Hessie Knows, MD Inpatient      TOTAL TIME TAKING CARE OF THIS PATIENT: 45 minutes.   Ana Washington 07/30/2017, 11:55 PM  [facility]  Office  [phone]  CC: Primary care physician; [name], MD  Note:  This document was prepared using Dragon voice recognition software and may include unintentional dictation errors.

## 2017-07-30 NOTE — ED Triage Notes (Signed)
Pt arrives via [facility] from home. Pt states she had a ground level fall, negative LOC, denies hitting head, Pt was on the floor approx 15 min.

## 2017-07-31 ENCOUNTER — Inpatient Hospital Stay: Payer: Medicare Other

## 2017-07-31 LAB — BASIC METABOLIC PANEL
Anion gap: 8 (ref 5–15)
Anion gap: 8 (ref 5–15)
BUN: 15 mg/dL (ref 8–23)
BUN: 17 mg/dL (ref 8–23)
CALCIUM: 8.9 mg/dL (ref 8.9–10.3)
CO2: 30 mmol/L (ref 22–32)
CO2: 34 mmol/L — AB (ref 22–32)
CREATININE: 0.48 mg/dL (ref 0.44–1.00)
CREATININE: 0.53 mg/dL (ref 0.44–1.00)
Calcium: 8.8 mg/dL — ABNORMAL LOW (ref 8.9–10.3)
Chloride: 95 mmol/L — ABNORMAL LOW (ref 98–111)
Chloride: 99 mmol/L (ref 98–111)
GFR calc Af Amer: >60 mL/min (ref >60)
GFR calc Af Amer: >60 mL/min (ref >60)
GFR calc non Af Amer: >60 mL/min (ref >60)
GFR calc non Af Amer: >60 mL/min (ref >60)
GLUCOSE: 101 mg/dL — AB (ref 70–99)
Glucose, Bld: 105 mg/dL — ABNORMAL HIGH (ref 70–99)
Potassium: 3.6 mmol/L (ref 3.5–5.1)
Potassium: 3.9 mmol/L (ref 3.5–5.1)
SODIUM: 137 mmol/L (ref 135–145)
Sodium: 137 mmol/L (ref 135–145)

## 2017-07-31 LAB — URINALYSIS, COMPLETE (UACMP) WITH MICROSCOPIC
Bacteria, UA: NONE SEEN
Bilirubin Urine: NEGATIVE
GLUCOSE, UA: NEGATIVE mg/dL
Hgb urine dipstick: NEGATIVE
Ketones, ur: NEGATIVE mg/dL
Leukocytes, UA: NEGATIVE
Nitrite: NEGATIVE
PH: 7 (ref 5.0–8.0)
Protein, ur: NEGATIVE mg/dL
Specific Gravity, Urine: 1.016 (ref 1.005–1.030)

## 2017-07-31 LAB — CBC
HCT: 35.4 % (ref 35.0–47.0)
Hemoglobin: 12 g/dL (ref 12.0–16.0)
MCH: 33.5 pg (ref 26.0–34.0)
MCHC: 33.9 g/dL (ref 32.0–36.0)
MCV: 98.9 fL (ref 80.0–100.0)
PLATELETS: 300 10*3/uL (ref 150–440)
RBC: 3.58 MIL/uL — ABNORMAL LOW (ref 3.80–5.20)
RDW: 12.6 % (ref 11.5–14.5)
WBC: 19 10*3/uL — ABNORMAL HIGH (ref 3.6–11.0)

## 2017-07-31 MED ORDER — ACETAMINOPHEN 650 MG RE SUPP: 650.0000 mg | Freq: Four times a day (QID) | RECTAL | Status: DC | PRN

## 2017-07-31 MED ORDER — OXYCODONE HCL 5 MG PO TABS
5.0000 mg | ORAL_TABLET | ORAL | Status: DC | PRN
  Administered 2017-07-31 (×3): 5 mg via ORAL
  Filled 2017-07-31 (×4): qty 1

## 2017-07-31 MED ORDER — GUAIFENESIN ER 600 MG PO TB12
600.0000 mg | ORAL_TABLET | Freq: Two times a day (BID) | ORAL | Status: DC
  Administered 2017-07-31 – 2017-08-03 (×7): 600 mg via ORAL
  Filled 2017-07-31 (×7): qty 1

## 2017-07-31 MED ORDER — ASPIRIN EC 81 MG PO TBEC
81.0000 mg | DELAYED_RELEASE_TABLET | Freq: Every day | ORAL | Status: DC
  Administered 2017-07-31 – 2017-08-03 (×4): 81 mg via ORAL
  Filled 2017-07-31 (×4): qty 1

## 2017-07-31 MED ORDER — ACETAMINOPHEN 325 MG PO TABS
650.0000 mg | ORAL_TABLET | Freq: Four times a day (QID) | ORAL | Status: DC | PRN
  Administered 2017-07-31 – 2017-08-03 (×9): 650 mg via ORAL
  Filled 2017-07-31 (×9): qty 2

## 2017-07-31 MED ORDER — DILTIAZEM HCL 30 MG PO TABS: 30.0000 mg | ORAL_TABLET | Freq: Two times a day (BID) | ORAL | Status: DC | PRN

## 2017-07-31 MED ORDER — LORAZEPAM 0.5 MG PO TABS
0.5000 mg | ORAL_TABLET | ORAL | Status: DC
  Administered 2017-07-31 – 2017-08-02 (×13): 0.5 mg via ORAL
  Administered 2017-08-02: 1 mg via ORAL
  Administered 2017-08-02 – 2017-08-03 (×4): 0.5 mg via ORAL
  Filled 2017-07-31 (×12): qty 1
  Filled 2017-07-31: qty 2
  Filled 2017-07-31 (×6): qty 1

## 2017-07-31 MED ORDER — HYDROCOD POLST-CPM POLST ER 10-8 MG/5ML PO SUER
6.0000 mL | Freq: Every day | ORAL | Status: DC
  Administered 2017-07-31 – 2017-08-02 (×3): 6 mL via ORAL
  Filled 2017-07-31 (×3): qty 10

## 2017-07-31 MED ORDER — BISACODYL 5 MG PO TBEC: 5.0000 mg | DELAYED_RELEASE_TABLET | Freq: Every day | ORAL | Status: DC | PRN

## 2017-07-31 MED ORDER — ONDANSETRON HCL 4 MG PO TABS: 4.0000 mg | ORAL_TABLET | Freq: Four times a day (QID) | ORAL | Status: DC | PRN

## 2017-07-31 MED ORDER — GUAIFENESIN 100 MG/5ML PO SOLN
5.0000 mL | ORAL | Status: DC | PRN
  Administered 2017-07-31: 100 mg via ORAL
  Filled 2017-07-31 (×3): qty 5

## 2017-07-31 MED ORDER — PREDNISONE 10 MG PO TABS
10.0000 mg | ORAL_TABLET | Freq: Every day | ORAL | Status: DC
  Administered 2017-07-31 – 2017-08-03 (×4): 10 mg via ORAL
  Filled 2017-07-31 (×4): qty 1

## 2017-07-31 MED ORDER — ONDANSETRON HCL 4 MG/2ML IJ SOLN
4.0000 mg | Freq: Four times a day (QID) | INTRAMUSCULAR | Status: DC | PRN
  Administered 2017-07-31 – 2017-08-02 (×2): 4 mg via INTRAVENOUS
  Filled 2017-07-31 (×2): qty 2

## 2017-07-31 MED ORDER — ALUM & MAG HYDROXIDE-SIMETH 200-200-20 MG/5ML PO SUSP
30.0000 mL | ORAL | Status: DC | PRN
  Administered 2017-07-31 – 2017-08-01 (×3): 30 mL via ORAL
  Filled 2017-07-31 (×3): qty 30

## 2017-07-31 MED ORDER — AZITHROMYCIN 250 MG PO TABS
250.0000 mg | ORAL_TABLET | Freq: Every day | ORAL | Status: DC
  Administered 2017-07-31: 250 mg via ORAL
  Filled 2017-07-31: qty 1

## 2017-07-31 MED ORDER — METOPROLOL SUCCINATE ER 50 MG PO TB24
50.0000 mg | ORAL_TABLET | Freq: Every day | ORAL | Status: DC
  Administered 2017-07-31 – 2017-08-03 (×4): 50 mg via ORAL
  Filled 2017-07-31 (×4): qty 1

## 2017-07-31 MED ORDER — DOCUSATE SODIUM 100 MG PO CAPS
100.0000 mg | ORAL_CAPSULE | Freq: Two times a day (BID) | ORAL | Status: DC
  Administered 2017-07-31 – 2017-08-03 (×7): 100 mg via ORAL
  Filled 2017-07-31 (×7): qty 1

## 2017-07-31 MED ORDER — PANTOPRAZOLE SODIUM 40 MG PO TBEC
40.0000 mg | DELAYED_RELEASE_TABLET | Freq: Every day | ORAL | Status: DC
  Administered 2017-07-31 – 2017-08-03 (×4): 40 mg via ORAL
  Filled 2017-07-31 (×4): qty 1

## 2017-07-31 MED ORDER — APIXABAN 5 MG PO TABS
5.0000 mg | ORAL_TABLET | Freq: Two times a day (BID) | ORAL | Status: DC
  Administered 2017-07-31 – 2017-08-03 (×7): 5 mg via ORAL
  Filled 2017-07-31 (×6): qty 1

## 2017-07-31 MED ORDER — CIPROFLOXACIN HCL 500 MG PO TABS
500.0000 mg | ORAL_TABLET | Freq: Two times a day (BID) | ORAL | Status: DC
  Administered 2017-07-31: 500 mg via ORAL
  Filled 2017-07-31 (×3): qty 1

## 2017-07-31 MED ORDER — OMEPRAZOLE MAGNESIUM 20 MG PO TBEC: 40.0000 mg | DELAYED_RELEASE_TABLET | Freq: Every day | ORAL | Status: DC

## 2017-07-31 MED ORDER — AMLODIPINE BESYLATE 5 MG PO TABS
5.0000 mg | ORAL_TABLET | Freq: Every day | ORAL | Status: DC
  Administered 2017-07-31 – 2017-08-03 (×4): 5 mg via ORAL
  Filled 2017-07-31 (×4): qty 1

## 2017-07-31 MED ORDER — LOSARTAN POTASSIUM 50 MG PO TABS
100.0000 mg | ORAL_TABLET | Freq: Every day | ORAL | Status: DC
  Administered 2017-07-31 – 2017-08-03 (×4): 100 mg via ORAL
  Filled 2017-07-31 (×4): qty 2

## 2017-07-31 NOTE — Progress Notes (Signed)
PT Cancellation Note  Patient Details Name: Ana Washington MRN: [id number] DOB: 08-10-1945   Cancelled Treatment:    Reason Eval/Treat Not Completed: Other (comment).  Noted no ortho consult yet.  Spoke with Dr. Posey Pronto who requested to hold PT Evaluation until Dr. Posey Pronto has been able to see the pt in person.  Will continue to follow acutely and will plan on seeing pt following ortho consult and once WB status orders have been received.     Collie Siad PT, DPT 07/31/2017, 9:40 AM

## 2017-07-31 NOTE — NC FL2 (Signed)
[place] LEVEL OF CARE SCREENING TOOL     IDENTIFICATION  Patient Name: Ana Washington Birthdate: [birth year] Sex: female Admission Date (Current Location): 07/30/2017  [place] and [place] Number:  Engineering geologist and Address:  [facility], [address], [place], [place] [address]      Provider Number: [id number]  Attending Physician Name and Address:  Demetrios Loll, MD  Relative Name and Phone Number:       Current Level of Care: Hospital Recommended Level of Care: [place] Prior Approval Number:    Date Approved/Denied:   PASRR Number: [id number])  Discharge Plan: SNF    Current Diagnoses: Patient Active Problem List   Diagnosis Date Noted  . GERD (gastroesophageal reflux disease) 07/30/2017  . Pelvic fracture ([place]) 07/30/2017  . PAF (paroxysmal atrial fibrillation) ([place]) 07/30/2017  . CAD (coronary artery disease) 07/30/2017  . Essential hypertension 10/03/2013  . Primary osteoarthritis of both knees 10/03/2013    Orientation RESPIRATION BLADDER Height & Weight     Self, Time, Situation, Place  O2(2 Liters Oxygen. ) Continent Weight: 100 lb (45.4 kg) Height:  5\' 5"  (165.1 cm)  BEHAVIORAL SYMPTOMS/MOOD NEUROLOGICAL BOWEL NUTRITION STATUS      Continent Diet(Diet: Heart Healthy )  AMBULATORY STATUS COMMUNICATION OF NEEDS Skin   Extensive Assist Verbally Normal                       Personal Care Assistance Level of Assistance  Bathing, Feeding, Dressing Bathing Assistance: Limited assistance Feeding assistance: Independent Dressing Assistance: Limited assistance     Functional Limitations Info  Sight, Hearing, Speech Sight Info: Adequate Hearing Info: Adequate Speech Info: Adequate    SPECIAL CARE FACTORS FREQUENCY  PT (By licensed PT), OT (By licensed OT)     PT Frequency: (5) OT Frequency: (5)            Contractures      Additional Factors Info  Code Status, Allergies  Code Status Info: (Full Code. ) Allergies Info: (Codeine, Lactalbumin, Milk-related Compounds, Penicillins)           Current Medications (07/31/2017):  This is the current hospital active medication list Current Facility-Administered Medications  Medication Dose Route Frequency Provider Last Rate Last Dose  . acetaminophen (TYLENOL) tablet 650 mg  650 mg Oral Q6H PRN Lance Coon, MD   650 mg at 07/31/17 4627   Or  . acetaminophen (TYLENOL) suppository 650 mg  650 mg Rectal Q6H PRN Lance Coon, MD      . alum & mag hydroxide-simeth (MAALOX/MYLANTA) 200-200-20 MG/5ML suspension 30 mL  30 mL Oral Q4H PRN Lance Coon, MD   30 mL at 07/31/17 0523  . amLODipine (NORVASC) tablet 5 mg  5 mg Oral Daily Lance Coon, MD   5 mg at 07/31/17 0855  . apixaban (ELIQUIS) tablet 5 mg  5 mg Oral BID Lance Coon, MD      . aspirin EC tablet 81 mg  81 mg Oral Daily Lance Coon, MD   81 mg at 07/31/17 0856  . bisacodyl (DULCOLAX) EC tablet 5 mg  5 mg Oral Daily PRN Demetrios Loll, MD      . chlorpheniramine-HYDROcodone (TUSSIONEX) 10-8 MG/5ML suspension 6 mL  6 mL Oral QHS Lance Coon, MD   6 mL at 07/31/17 0202  . diltiazem (CARDIZEM) tablet 30 mg  30 mg Oral BID PRN Lance Coon, MD      . docusate sodium (COLACE)  capsule 100 mg  100 mg Oral BID Demetrios Loll, MD   100 mg at 07/31/17 0855  . guaiFENesin (MUCINEX) 12 hr tablet 600 mg  600 mg Oral BID Lance Coon, MD   600 mg at 07/31/17 0856  . LORazepam (ATIVAN) tablet 0.5-1 mg  0.5-1 mg Oral Q4H Lance Coon, MD   0.5 mg at 07/31/17 0856  . losartan (COZAAR) tablet 100 mg  100 mg Oral Daily Lance Coon, MD   100 mg at 07/31/17 0856  . metoprolol succinate (TOPROL-XL) 24 hr tablet 50 mg  50 mg Oral Daily Lance Coon, MD   50 mg at 07/31/17 0204  . ondansetron (ZOFRAN) tablet 4 mg  4 mg Oral Q6H PRN Lance Coon, MD       Or  . ondansetron [facility]) injection 4 mg  4 mg Intravenous Q6H PRN Lance Coon, MD   4 mg at 07/31/17 0853  . oxyCODONE  (Oxy IR/ROXICODONE) immediate release tablet 5 mg  5 mg Oral Q4H PRN Lance Coon, MD   5 mg at 07/31/17 0856  . pantoprazole (PROTONIX) EC tablet 40 mg  40 mg Oral Daily Lance Coon, MD   40 mg at 07/31/17 0856  . predniSONE (DELTASONE) tablet 10 mg  10 mg Oral Q breakfast Lance Coon, MD   10 mg at 07/31/17 5852     Discharge Medications: Please see discharge summary for a list of discharge medications.  Relevant Imaging Results:  Relevant Lab Results:   Additional Information (SSN: [id number])  Curtisha Bendix, Veronia Beets, LCSW

## 2017-07-31 NOTE — Progress Notes (Addendum)
Per [facility] admissions coordinator at [facility] when she verified patient's insurance it came up that patient is on hospice. Per Magda Paganini in order for patient to come to [facility] under her insurance for short term rehab she would have to revoke hospice and then her insruance would revert back to traditional medicare, which requires a 3 night qualyfing inpatient stay in a hospital in order to pay for SNF. Per Magda Paganini patient can come to [facility] under hospice care but she would have to be private pay. Clinical Education officer, museum (CSW) met with patient and made her and her niece at bedside aware of above. Per niece patient is on community hospice at home. CSW also contacted patient's sister Dazel and made her aware of above. CSW also made patient's daughter Marlowe Kays aware of above. Per Marlowe Kays patient's husband is coming to [facility] tomorrow morning and will talk about the options with patient.    CSW confirmed with [facility] staff that patient is a current hospice patient. Per Vivien Rota patient can have a little bit of PT at home under hospice and they can get a hospital bed out to the house if needed. Patient's daughter Marlowe Kays is aware of above.   [employer], LCSW [phone]

## 2017-07-31 NOTE — Progress Notes (Signed)
OT Cancellation Note  Patient Details Name: Ana Washington MRN: [id number] DOB: 08/06/1945   Cancelled Treatment:    Reason Eval/Treat Not Completed: Patient at procedure or test/ unavailable. Pt out of room for diagnostic testing upon 2nd attempt. Will re-attempt at later date/time as pt is available.  Jeni Salles, MPH, MS, OTR/L ascom [phone] 07/31/17, 1:41 PM

## 2017-07-31 NOTE — Progress Notes (Signed)
Advanced Care Plan.  Purpose of Encounter: CODE STATUS Parties in Attendance: The patient, her sister and niece, me. Patient's Decisional Capacity: Yes. Medical Story: Ana Washington  is a 72 y.o. female with multiple medical problems including chronic respiratory failure due to chronic lung disease, A. fib, CAD, hypertension, bronchiectasis and arthritis.  She is admitted for mechanical fall at home and subsequent pelvic fracture.  She was found severe malnutrition.  I discussed with the patient about her current condition, prognosis and CODE STATUS.  The patient does not want to be resuscitated.  She wants DNR.  I changed from full code to DNR status.  Plan:  Code Status: DNR. Time spent discussing advance care planning: 17 minutes.

## 2017-07-31 NOTE — Consult Note (Addendum)
ORTHOPAEDIC CONSULTATION  REQUESTING PHYSICIAN: Demetrios Loll, MD  Chief Complaint:   R hip pain and R elbow pain  History of Present Illness: Ana Washington is a 72 y.o. female who had a fall at home yesterday.  She did not have any pain in her right hip or right elbow prior to this fall.  The patient noted immediate R hip pain and inability to ambulate.  The patient ambulates with a walker at baseline.  Pain is described as sharp at its worst and a dull ache at its best.  Pain is rated a 0 out of 10 in severity at rest, but can be an 8/10 with movement.  Pain is improved with rest.  Pain is worse with weightbearing.   Regarding her R elbow, she believes she may have fallen on itat the same time.  Pain is located along the medial aspect of the elbow.  Pain is rated a 0 out of 10 at rest and 4 out of 10 at its worst.  Pain is worse with palpation.  Pain is improved with rest and immobilization.  Pain is described as sharp.  She notes that at rest, her breathing is causing more difficulty than her fractures.  X-rays in the emergency department show right superior and inferior pubic rami fractures as well as possible minimally displaced fracture line at the level of the right medial epicondyle.  Past Medical History:  Diagnosis Date  . Arthritis   . Atrial fibrillation ([place])   . Bronchiectasis ([place])   . CAD (coronary artery disease)   . CAD (coronary artery disease) 07/30/2017  . Dyspnea   . 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 GERD (gastroesophageal reflux disease)   . Hypertension   . Lung disease   . Myocardial infarction ([place])   . [name] (postoperative nausea and vomiting)    Past Surgical History:  Procedure Laterality Date  . BACK SURGERY    . CHOLECYSTECTOMY    . FOOT SURGERY    . KYPHOPLASTY N/A 07/05/2016   Procedure: KYPHOPLASTY T - 9;  Surgeon: Hessie Knows, MD;  Location: [facility] ORS;  Service: Orthopedics;   Laterality: N/A;  . LUNG SURGERY  1990 and 1996   Social History   Socioeconomic History  . Marital status: Married    Spouse name: Not on file  . Number of children: Not on file  . Years of education: Not on file  . Highest education level: Not on file  Occupational History  . Not on file  Social Needs  . Financial resource strain: Not on file  . Food insecurity:    Worry: Not on file    Inability: Not on file  . Transportation needs:    Medical: Not on file    Non-medical: Not on file  Tobacco Use  . Smoking status: Never Smoker  . Smokeless tobacco: Never Used  Substance and Sexual Activity  . Alcohol use: No  . Drug use: No  . Sexual activity: Not on file  Lifestyle  . Physical activity:    Days per week: Not on file    Minutes per session: Not on file  . Stress: Not on file  Relationships  . Social connections:    Talks on phone: Not on file    Gets together: Not on file    Attends religious service: Not on file    Active member of club or organization: Not on file    Attends meetings of clubs or organizations: Not on file  Relationship status: Not on file  Other Topics Concern  . Not on file  Social History Narrative  . Not on file   Family History  Problem Relation Age of Onset  . Hypertension Mother   . Hypertension Father    Allergies  Allergen Reactions  . Codeine Nausea And Vomiting  . Lactalbumin Diarrhea  . Milk-Related Compounds Diarrhea  . Penicillins Rash    Has patient had a PCN reaction causing immediate rash, facial/tongue/throat swelling, SOB or lightheadedness with hypotension: Unknown Has patient had a PCN reaction causing severe rash involving mucus membranes or skin necrosis: Unknown Has patient had a PCN reaction that required hospitalization: Unknown Has patient had a PCN reaction occurring within the last 10 years: No If all of the above answers are "NO", then may proceed with Cephalosporin use.    Prior to Admission  medications   Medication Sig Start Date End Date Taking? Authorizing Provider  acetaminophen (TYLENOL) 500 MG tablet Take 1,000 mg by mouth every 4 (four) hours.    Yes [provider]  amLODipine (NORVASC) 5 MG tablet Take 5 mg by mouth daily.  11/04/13  Yes [provider]  apixaban (ELIQUIS) 5 MG TABS tablet Take 5 mg by mouth daily.   Yes [provider]  Ascorbic Acid (VITAMIN C) 1000 MG tablet Take 1,000 mg by mouth daily.   Yes [provider]  aspirin EC 81 MG tablet Take 81 mg by mouth daily.   Yes [provider]  azithromycin (ZITHROMAX) 250 MG tablet Take 250 mg by mouth daily. continuous 09/09/11  Yes [provider]  Calcium Carbonate-Vitamin D (OYSTER SHELL/VITAMIN D) 600-125 MG-UNIT TABS Take 1 tablet by mouth daily. 11/05/07  Yes [provider]  chlorpheniramine-HYDROcodone (TUSSIONEX) 10-8 MG/5ML SUER Take 6 mLs by mouth at bedtime.   Yes [provider]  Cholecalciferol (VITAMIN D-1000 MAX ST) 1000 UNITS tablet Take 1,000 Units by mouth daily.    Yes [provider]  ciprofloxacin (CIPRO) 500 MG tablet Take 500 mg by mouth 2 (two) times daily. continuous 10/21/13  Yes [provider]  diltiazem (CARDIZEM) 30 MG tablet Take 30 mg by mouth as needed (A-fib). Takes 1 tablet for heart rate over 100 and 2nd tablet 1 hour later if needed   Yes [provider]  ferrous sulfate 325 (65 FE) MG EC tablet Take 325 mg by mouth daily.    Yes [provider]  LORazepam (ATIVAN) 0.5 MG tablet Take 0.5-1 mg by mouth every 4 (four) hours. 0.5 mg every 4 hours and 2 tablets at bedtime   Yes [provider]  losartan (COZAAR) 100 MG tablet Take 100 mg by mouth daily. take 1 tablet by mouth once daily 07/10/13  Yes [provider]  metoprolol succinate (TOPROL-XL) 50 MG 24 hr tablet Take 50 mg by mouth at bedtime.  10/17/13  Yes [provider]  Multiple Vitamin  (MULTIVITAMIN WITH MINERALS) TABS tablet Take 1 tablet by mouth daily. Women's 50+   Yes [provider]  omeprazole (PRILOSEC OTC) 20 MG tablet Take 40 mg by mouth daily. TAKES 2 TABLETS   Yes [provider]  predniSONE (DELTASONE) 10 MG tablet Take 1 tablet (10 mg total) by mouth daily with breakfast. 06/06/17  Yes Laverle Hobby, MD  Probiotic Product (ALIGN) 4 MG CAPS Take 4 mg by mouth daily.   Yes [provider]  sodium chloride 0.9 % nebulizer solution Take 3 mLs by nebulization 2 (two)  times daily as needed for wheezing. Wheezing  06/07/16  Yes [provider]   Recent Labs    07/30/17 2347 07/31/17 0527  WBC 22.5* 19.0*  HGB 11.9* 12.0  HCT 34.8* 35.4  PLT 333 300  K 3.9 3.6  CL 99 95*  CO2 30 34*  BUN 17 15  CREATININE 0.53 0.48  GLUCOSE 105* 101*  CALCIUM 8.8* 8.9   Dg Elbow Complete Right  Result Date: 07/30/2017 CLINICAL DATA:  72 year old female with fall and right elbow pain. EXAM: RIGHT ELBOW - COMPLETE 3+ VIEW COMPARISON:  None. FINDINGS: No definite acute fracture identified. A faint linear lucency through the medial epicondyle of the humerus is likely chronic and less likely represent a nondisplaced fracture. The bones are osteopenic. There is elevation of the anterior fat pad representing joint effusion. An occult fracture primarily involving the radial head is not entirely excluded. The soft tissues are grossly unremarkable. IMPRESSION: No definite acute fracture or dislocation. There is moderate joint effusion and therefore an occult fracture is not entirely excluded. Electronically Signed   By: Anner Crete M.D.   On: 07/30/2017 22:46   Dg Hip Unilat W Or Wo Pelvis 2-3 Views Right  Result Date: 07/30/2017 CLINICAL DATA:  Ground level fall EXAM: DG HIP (WITH OR WITHOUT PELVIS) 2-3V RIGHT COMPARISON:  None. FINDINGS: SI joint degenerative changes. Probable acute minimally displaced fracture involving the right superior  pubic ramus. Questionable nondisplaced right inferior pubic ramus fracture on the lateral view of the right hip. Right femoral head demonstrates normal alignment IMPRESSION: 1. Suspected acute fractures of the right superior and inferior pubic rami. Electronically Signed   By: Donavan Foil M.D.   On: 07/30/2017 22:46     Positive ROS: All other systems have been reviewed and were otherwise negative with the exception of those mentioned in the HPI and as above.  Physical Exam: BP (!) 175/104 (BP Location: Left Arm)   Pulse (!) 105   Temp 98.8 F (37.1 C)   Resp 18   Ht 5\' 5"  (1.651 m)   Wt 45.4 kg (100 lb)   SpO2 94%   BMI 16.64 kg/m  General:  Alert, no acute distress Psychiatric:  Patient is competent for consent with normal mood and affect   Cardiovascular:  No pedal edema, regular rate and rhythm Respiratory:  Wheezing present, mildly labored breathing GI:  Abdomen is soft and non-tender Skin:  No lesions in the area of chief complaint, no erythema Neurologic:  Sensation intact distally, CN grossly intact Lymphatic:  No axillary or cervical lymphadenopathy  Orthopedic Exam:  RLE: 5/5 DF/PF/EHL SILT grossly over foot Foot wwp Negative log roll and axial load No tenderness to palpation over the greater trochanter Positive for pain with lateral compression of the pelvis. Range of motion is 90 flexion, 45 external rotation, 10 internal rotation.  Patient has mild pain with flexion and internal rotation of the hip.  RUE: +ain/pin/u motor SILT r/u/m/ax +rad pulse RoM elbow is 0- 130 with mild pain at full flexion.  80 supination and 80 pronation without pain. Tenderness is present over the medial epicondyle focally.  There is no tenderness over the olecranon, lateral epicondyles, proximal forearm  X-rays:  As above: R superior and inferior pubic rami fracture and possible fracture line through the medial epicondyle  Assessment/Plan: Ana Washington is a 72 y.o. female  with R superior and inferior pubic rami fracture and possible fracture line through the medial epicondyle   1.  No surgical intervention required at this time for either the right elbow or pelvis. 2.  Weightbearing as tolerated on right lower extremity. 3.  Recommend obtaining a CT scan of her right elbow to further evaluate for possible minimally displaced fracture of the distal humerus 4.  Maintain arm in splint and nonweightbearing on right upper extremity until after CT scan results have been reviewed. 5.  Recommend PT/OT 6.  Pain control per primary team 7.  Will leave further recommendations after CT scan.   Leim Fabry   07/31/2017 12:36 PM

## 2017-07-31 NOTE — Clinical Social Work Placement (Signed)
   CLINICAL SOCIAL WORK PLACEMENT  NOTE  Date:  07/31/2017  Patient Details  Name: Ana Washington MRN: [id number] Date of Birth: Aug 03, 1945  Clinical Social Work is seeking post-discharge placement for this patient at the [place] level of care (*CSW will initial, date and re-position this form in  chart as items are completed):  Yes   Patient/family provided with [facility] Work Department's list of facilities offering this level of care within the geographic area requested by the patient (or if unable, by the patient's family).  Yes   Patient/family informed of their freedom to choose among providers that offer the needed level of care, that participate in Medicare, Medicaid or managed care program needed by the patient, have an available bed and are willing to accept the patient.  Yes   Patient/family informed of [facility]'s ownership interest in [facility] and [facility], as well as of the fact that they are under no obligation to receive care at these facilities.  PASRR submitted to EDS on 07/31/17     PASRR number received on 07/31/17     Existing PASRR number confirmed on       FL2 transmitted to all facilities in geographic area requested by pt/family on 07/31/17     FL2 transmitted to all facilities within larger geographic area on       Patient informed that his/her managed care company has contracts with or will negotiate with certain facilities, including the following:        Yes   Patient/family informed of bed offers received.  [facility] )     Physician recommends and patient chooses bed at      Patient to be transferred to   on  .  Patient to be transferred to facility by       Patient family notified on   of transfer.  Name of family member notified:        PHYSICIAN       Additional Comment:    _______________________________________________ Annakate Soulier, Veronia Beets, LCSW 07/31/2017,  11:17 AM

## 2017-07-31 NOTE — Progress Notes (Signed)
OT Cancellation Note  Patient Details Name: Ana Washington MRN: [id number] DOB: 1945-11-06   Cancelled Treatment:    Reason Eval/Treat Not Completed: Other (comment). Order received, chart reviewed. Pt noted with no ortho consult yet. Per PT, PT spoke with Dr. Posey Pronto who requested to hold therapy until Dr. Posey Pronto has been able to see the pt in person.  Will continue to follow acutely and will plan on seeing pt following ortho consult and once WB status orders have been received.     Jeni Salles, MPH, MS, OTR/L ascom [phone] 07/31/17, 10:02 AM

## 2017-07-31 NOTE — Progress Notes (Addendum)
[place] [facility] [place] NAME: Ana Washington    MR#:  [id number]  DATE OF BIRTH:  Aug 06, 1945  SUBJECTIVE:  CHIEF COMPLAINT:   Chief Complaint  Patient presents with  . Fall   Body pain.  On home oxygen 2 L. REVIEW OF SYSTEMS:  Review of Systems  Constitutional: Positive for malaise/fatigue. Negative for chills and fever.  HENT: Negative for sore throat.   Eyes: Negative for blurred vision and double vision.  Respiratory: Positive for cough and shortness of breath. Negative for hemoptysis, wheezing and stridor.   Cardiovascular: Negative for chest pain, palpitations, orthopnea and leg swelling.  Gastrointestinal: Negative for abdominal pain, blood in stool, diarrhea, melena, nausea and vomiting.  Genitourinary: Positive for dysuria. Negative for flank pain and hematuria.  Musculoskeletal: Positive for falls and joint pain. Negative for back pain.  Neurological: Negative for dizziness, sensory change, focal weakness, seizures, loss of consciousness, weakness and headaches.  Endo/Heme/Allergies: Negative for polydipsia.  Psychiatric/Behavioral: Negative for depression. The patient is not nervous/anxious.     DRUG ALLERGIES:   Allergies  Allergen Reactions  . Codeine Nausea And Vomiting  . Lactalbumin Diarrhea  . Milk-Related Compounds Diarrhea  . Penicillins Rash    Has patient had a PCN reaction causing immediate rash, facial/tongue/throat swelling, SOB or lightheadedness with hypotension: Unknown Has patient had a PCN reaction causing severe rash involving mucus membranes or skin necrosis: Unknown Has patient had a PCN reaction that required hospitalization: Unknown Has patient had a PCN reaction occurring within the last 10 years: No If all of the above answers are "NO", then may proceed with Cephalosporin use.    VITALS:  Blood pressure (!) 175/104, pulse (!) 105, temperature 98.8 F (37.1 C), resp. rate 18, height 5\' 5"  (1.651  m), weight 100 lb (45.4 kg), SpO2 94 %. PHYSICAL EXAMINATION:  Physical Exam  Constitutional: She is oriented to person, place, and time.  Severe malnutrition.  HENT:  Head: Normocephalic.  Mouth/Throat: Oropharynx is clear and moist.  Eyes: Pupils are equal, round, and reactive to light. Conjunctivae and EOM are normal. No scleral icterus.  Neck: Normal range of motion. Neck supple. No JVD present. No tracheal deviation present.  Cardiovascular: Normal rate, regular rhythm and normal heart sounds. Exam reveals no gallop.  No murmur heard. Pulmonary/Chest: Effort normal. No respiratory distress. She has no wheezes. She has no rales.  Bilateral crackles.  Abdominal: Soft. Bowel sounds are normal. She exhibits no distension. There is no tenderness. There is no rebound.  Musculoskeletal: Normal range of motion. She exhibits no edema or tenderness.  Neurological: She is alert and oriented to person, place, and time. No cranial nerve deficit.  Skin: No rash noted. No erythema.   LABORATORY PANEL:  Female CBC Recent Labs  Lab 07/31/17 0527  WBC 19.0*  HGB 12.0  HCT 35.4  PLT 300   ------------------------------------------------------------------------------------------------------------------ Chemistries  Recent Labs  Lab 07/31/17 0527  NA 137  K 3.6  CL 95*  CO2 34*  GLUCOSE 101*  BUN 15  CREATININE 0.48  CALCIUM 8.9   RADIOLOGY:  Dg Elbow Complete Right  Result Date: 07/30/2017 CLINICAL DATA:  72 year old female with fall and right elbow pain. EXAM: RIGHT ELBOW - COMPLETE 3+ VIEW COMPARISON:  None. FINDINGS: No definite acute fracture identified. A faint linear lucency through the medial epicondyle of the humerus is likely chronic and less likely represent a nondisplaced fracture. The bones are osteopenic. There is elevation of the  anterior fat pad representing joint effusion. An occult fracture primarily involving the radial head is not entirely excluded. The soft tissues  are grossly unremarkable. IMPRESSION: No definite acute fracture or dislocation. There is moderate joint effusion and therefore an occult fracture is not entirely excluded. Electronically Signed   By: Anner Crete M.D.   On: 07/30/2017 22:46   Ct Elbow Right Wo Contrast  Result Date: 07/31/2017 CLINICAL DATA:  Medial elbow pain after fall. EXAM: CT OF THE LOWER RIGHT EXTREMITY WITHOUT CONTRAST TECHNIQUE: Multidetector CT imaging of the right lower extremity was performed according to the standard protocol. COMPARISON:  Right elbow x-rays from yesterday. FINDINGS: Bones/Joint/Cartilage No definite fracture. No dislocation. Small joint effusion. Joint spaces are preserved. Ligaments Suboptimally assessed by CT. Muscles and Tendons Unremarkable. Soft tissues Small amount of soft tissue swelling over the posteromedial elbow. No soft tissue mass or fluid collection. IMPRESSION: 1. Small joint effusion, suggesting occult nondisplaced fracture. No discrete fracture identified. Electronically Signed   By: Titus Dubin M.D.   On: 07/31/2017 13:57   Dg Hip Unilat W Or Wo Pelvis 2-3 Views Right  Result Date: 07/30/2017 CLINICAL DATA:  Ground level fall EXAM: DG HIP (WITH OR WITHOUT PELVIS) 2-3V RIGHT COMPARISON:  None. FINDINGS: SI joint degenerative changes. Probable acute minimally displaced fracture involving the right superior pubic ramus. Questionable nondisplaced right inferior pubic ramus fracture on the lateral view of the right hip. Right femoral head demonstrates normal alignment IMPRESSION: 1. Suspected acute fractures of the right superior and inferior pubic rami. Electronically Signed   By: Donavan Foil M.D.   On: 07/30/2017 22:46   ASSESSMENT AND PLAN:   Pelvic fracture ([place]) -due to mechanical fall,  PT and OT. PRN analgesia, orthopedic consult: Per Dr. Posey Pronto, No surgical intervention required at this time for either the right elbow or pelvis.    Essential hypertension -continue home meds    PAF (paroxysmal atrial fibrillation) (HCC) -continue home rate controlling medications and anticoagulation   CAD (coronary artery disease) -continue home medications   GERD (gastroesophageal reflux disease) -home dose PPI Chronic respiratory failure with chronic lung disease.  Continue home oxygen, prednisone and nebulizer as needed. Dysuria.  Check urinalysis. Leukocytosis.  Possible due to chronic prednisone.  Check UA and follow-up CBC.  All the records are reviewed and case discussed with Care Management/Social Worker. Management plans discussed with the patient, sister, niece and they are in agreement.  CODE STATUS: DNR  TOTAL TIME TAKING CARE OF THIS PATIENT: 32 minutes.   More than 50% of the time was spent in counseling/coordination of care: YES  POSSIBLE D/C IN 1-2 DAYS, DEPENDING ON CLINICAL CONDITION.   Demetrios Loll M.D on 07/31/2017 at 3:25 PM  Between 7am to 6pm - Pager - [phone]  After 6pm go to [email or web address] - [facility]

## 2017-07-31 NOTE — Discharge Instructions (Signed)

## 2017-07-31 NOTE — Progress Notes (Signed)
CT scan of R elbow reviewed. No notable fracture lines present, but there is an elbow joint effusion. Given this, I would recommend treating for a non-displaced fracture.  - NWB on RUE - Maintain splint - PT/OT, can have RoM of wrist, fingers - F/u in 2 weeks as outpatient to transition out of splint if repeat x-rays are normal

## 2017-07-31 NOTE — Progress Notes (Signed)
Nutrition Brief Note  Patient identified to be seen for low BMI  72 year old female with PMHx of HTN, bronchiectasis, GERD, arthritis, CAD, hx MI, A-fib who is now admitted after mechanical fall at home found to have pelvic fracture.  Attempted to meet with patient and family at bedside. Family reports patient has chronic issues with poor appetite and intake because of her "lung disease." They report she is only taking bites of ice chips and sips of Ginger-ale today. They report she has tried many different oral nutrition supplements and does not like any of them. They refuse NFPE. Also refuse any nutrition interventions at this time.  Body mass index is 16.64 kg/m. Patient meets criteria for underweight based on current BMI.  Current diet order is Heart Healthy, patient is consuming only bites/sips of ice chips and Ginger-ale at this time per family. Labs and medications reviewed.  Patient and family have refused any nutrition interventions. If nutrition-related issues arise please consult RD.  Willey Blade, MS, [place], LDN Office: [phone] Pager: [phone] After Hours/Weekend Pager: [phone]

## 2017-07-31 NOTE — Clinical Social Work Note (Signed)
Clinical Social Work Assessment  Patient Details  Name: Ana Washington MRN: [id number] Date of Birth: May 13, 1945  Date of referral:  07/31/17               Reason for consult:  Facility Placement                Permission sought to share information with:  Chartered certified accountant granted to share information::  Yes, Verbal Permission Granted  Name::      [place]::   [place]   Relationship::     Contact Information:     Housing/Transportation Living arrangements for the past 2 months:  [place] of Information:  Patient, Siblings Patient Interpreter Needed:  None Criminal Activity/Legal Involvement Pertinent to Current Situation/Hospitalization:  No - Comment as needed Significant Relationships:  Spouse, Siblings Lives with:  Spouse Do you feel safe going back to the place where you live?  Yes Need for family participation in patient care:  Yes (Comment)  Care giving concerns: Patient lives with her husband Fritz Pickerel in [place].    Social Worker assessment / plan: Holiday representative ([place]) reviewed chart and noted that patient has a pelvic fracture, PT is pending. CSW met with patient and her sister Dazel was at bedside. Patient was alert and oriented X4 and was laying in the bed. Per patient she lives with her husband. CSW explained that PT will evaluate patient and make a recommendation of home health or SNF. Patient prefers to go home but is agreeable to SNF if needed. Per sister Janeece [facility] is preference if SNF is needed. FL2 complete and faxed out. CSW explained that [facility] will have to approve SNF. Patient and sister verbalized their understanding.   Per [facility] admissions coordinator at [facility] they can accept patient and she will start [facility] authorization. CSW will continue to follow and assist as needed.        Employment status:  Retired Nurse, adult PT  Recommendations:  Not assessed at this time [place] / Referral to community resources:  [place]  Patient/Family's Response to care: Patient prefers to go home but is agreeable to [facility] is needed.   Patient/Family's Understanding of and Emotional Response to Diagnosis, Current Treatment, and Prognosis: Patient was very pleasant and thanked CSW for assistance.   Emotional Assessment Appearance:  Appears stated age Attitude/Demeanor/Rapport:    Affect (typically observed):  Accepting, Adaptable, Pleasant Orientation:  Oriented to Self, Oriented to Place, Oriented to  Time, Oriented to Situation Alcohol / Substance use:  Not Applicable Psych involvement (Current and /or in the community):  No (Comment)  Discharge Needs  Concerns to be addressed:  Discharge Planning Concerns Readmission within the last 30 days:  No Current discharge risk:  Dependent with Mobility Barriers to Discharge:  Continued Medical Work up   [employer], Veronia Beets, LCSW 07/31/2017, 11:20 AM

## 2017-07-31 NOTE — Evaluation (Signed)
Physical Therapy Evaluation Patient Details Name: Ana Washington MRN: [id number] DOB: Dec 27, 1945 Today's Date: 07/31/2017   History of Present Illness  Pt is a 72 y.o. female presenting to hospital 07/30/17 with R hip and R elbow pain s/p mechanical fall (slipped off bed).  Imaging showing R superior/inferior pubic rami fx's and possible minimally displaced fx line at level of R medial epicondyle.  PMH includes htn, MI, [name], OA B knees, back surgery, foot surgery, kyphoplasty, lung surgery.  Clinical Impression  Prior to hospital admission, pt was independent with ambulation; on 2 L home O2.  Pt lives with her husband in 1 level home with ramp to enter (pt's husband unable to assist pt).  Currently pt is max assist supine to sit and mod to max assist to stand and take a few steps bed to recliner.  Increased time required for all activity and limited activity d/t R pelvic pain.  Pt would benefit from skilled PT to address noted impairments and functional limitations (see below for any additional details).  Upon hospital discharge, recommend pt discharge to [facility].    Follow Up Recommendations SNF    Equipment Recommendations  (TBD at next facility)    Recommendations for Other Services OT consult     Precautions / Restrictions Precautions Precautions: Fall Restrictions Weight Bearing Restrictions: Yes RUE Weight Bearing: Non weight bearing RLE Weight Bearing: Weight bearing as tolerated Other Position/Activity Restrictions: Maintain R UE in arm splint      Mobility  Bed Mobility Overal bed mobility: Needs Assistance Bed Mobility: Supine to Sit     Supine to sit: Max assist;HOB elevated     General bed mobility comments: assist for trunk and B LE's supine to sit; increased time to perform d/t R pelvic pain  Transfers Overall transfer level: Needs assistance Equipment used: None Transfers: Sit to/from [facility] Sit to Stand: Mod assist;Max assist Stand pivot  transfers: Mod assist;Max assist       General transfer comment: assist to initiate and come to full stand; pt able to take a few steps bed to recliner with assist  Ambulation/Gait             General Gait Details: Deferred d/t assist required to stand and transfer to chair and also d/t pain levels  Stairs            Wheelchair Mobility    Modified Rankin (Stroke Patients Only)       Balance Overall balance assessment: Needs assistance Sitting-balance support: Single extremity supported;Feet supported Sitting balance-Leahy Scale: Poor Sitting balance - Comments: requires at least single L UE support for static sitting balance   Standing balance support: No upper extremity supported Standing balance-Leahy Scale: Poor Standing balance comment: requires assist for static standing balance                             Pertinent Vitals/Pain Pain Assessment: Faces Faces Pain Scale: Hurts little more(8/10 with activity; 4/10 at rest) Pain Location: R pelvis Pain Descriptors / Indicators: Sore;Aching Pain Intervention(s): Limited activity within patient's tolerance;Monitored during session;Premedicated before session;Repositioned  Beginning of session pt's O2 sats noted to be <88% on room air and pt placed on 2 L O2 via nasal cannula (nursing notified immediately); O2 90% (on 2 L O2 via nasal cannula) prior to OOB mobility; O2 decreased to 86% after transfer to chair; and increased to 92% by end of session (nursing notified of all  O2 sats during session). HR WFL during session.    Home Living Family/patient expects to be discharged to:: Private residence Living Arrangements: Spouse/significant other Available Help at Discharge: Family Type of Home: House Home Access: [place]: [address] [place]: Environmental consultant - 2 wheels      Prior Function Level of Independence: Independent with assistive device(s)         Comments: Pt  ambulating without AD prior to admission.  No other falls in past 6 months.  Pt's husband uses RW and requires some assist (pt's daughter staying to help currently).  On 2 L home O2.     Hand Dominance        Extremity/Trunk Assessment   Upper Extremity Assessment Upper Extremity Assessment: Defer to OT evaluation(R UE deferred d/t NWB'ing in arm splint; L UE WFL)    Lower Extremity Assessment Lower Extremity Assessment: RLE deficits/detail;LLE deficits/detail RLE Deficits / Details: at least 3-/5 hip flexion; at least 3+/5 knee flexion/extension and DF LLE Deficits / Details: at least 3/5 hip flexion; at least 3+/5 knee flexion/extension and DF    Cervical / Trunk Assessment Cervical / Trunk Assessment: Normal  Communication   Communication: No difficulties  Cognition Arousal/Alertness: Awake/alert Behavior During Therapy: WFL for tasks assessed/performed Overall Cognitive Status: Within Functional Limits for tasks assessed                                        General Comments General comments (skin integrity, edema, etc.): R UE in splint.  Nursing cleared pt for participation in physical therapy.  Pt and pt's sister agreeable to PT session and requesting to get into chair.    Exercises     Assessment/Plan    PT Assessment Patient needs continued PT services  PT Problem List Decreased strength;Decreased activity tolerance;Decreased balance;Decreased mobility;Decreased knowledge of use of DME;Decreased knowledge of precautions;Pain       PT Treatment Interventions DME instruction;Gait training;Functional mobility training;Therapeutic activities;Therapeutic exercise;Balance training;Patient/family education    PT Goals (Current goals can be found in the Care Plan section)  Acute Rehab PT Goals Patient Stated Goal: to have less pain PT Goal Formulation: With patient Time For Goal Achievement: 08/14/17 Potential to Achieve Goals: Fair    Frequency  7X/week   Barriers to discharge Decreased caregiver support Level of assist    Co-evaluation               AM-PAC PT "6 Clicks" Daily Activity  Outcome Measure Difficulty turning over in bed (including adjusting bedclothes, sheets and blankets)?: Unable Difficulty moving from lying on back to sitting on the side of the bed? : Unable Difficulty sitting down on and standing up from a chair with arms (e.g., wheelchair, bedside commode, etc,.)?: Unable Help needed moving to and from a bed to chair (including a wheelchair)?: A Lot Help needed walking in hospital room?: Total Help needed climbing 3-5 steps with a railing? : Total 6 Click Score: 7    End of Session Equipment Utilized During Treatment: Gait belt Activity Tolerance: Patient limited by pain Patient left: in chair;with call bell/phone within reach;with chair alarm set;with family/visitor present;Other (comment)(B heels elevated via pillow) Nurse Communication: Mobility status;Precautions;Weight bearing status PT Visit Diagnosis: Other abnormalities of gait and mobility (R26.89);Muscle weakness (generalized) (M62.81);History of falling (Z91.81);Difficulty in walking, not elsewhere classified (R26.2);Pain Pain - Right/Left: Right Pain -  part of body: Hip    Time: 0623-7628 PT Time Calculation (min) (ACUTE ONLY): 42 min   Charges:   PT Evaluation $PT Eval Low Complexity: 1 Low PT Treatments $Therapeutic Activity: 23-37 mins   PT G CodesLeitha Bleak, PT 07/31/17, 5:19 PM [phone]

## 2017-08-01 ENCOUNTER — Ambulatory Visit: Admitting: Internal Medicine

## 2017-08-01 ENCOUNTER — Inpatient Hospital Stay: Payer: Medicare Other

## 2017-08-01 LAB — CBC
HCT: 33.1 % — ABNORMAL LOW (ref 35.0–47.0)
Hemoglobin: 11.1 g/dL — ABNORMAL LOW (ref 12.0–16.0)
MCH: 33.5 pg (ref 26.0–34.0)
MCHC: 33.7 g/dL (ref 32.0–36.0)
MCV: 99.3 fL (ref 80.0–100.0)
Platelets: 307 10*3/uL (ref 150–440)
RBC: 3.33 MIL/uL — ABNORMAL LOW (ref 3.80–5.20)
RDW: 12.6 % (ref 11.5–14.5)
WBC: 31.8 10*3/uL — AB (ref 3.6–11.0)

## 2017-08-01 MED ORDER — LEVOFLOXACIN IN D5W 750 MG/150ML IV SOLN
750.0000 mg | INTRAVENOUS | Status: DC
  Administered 2017-08-01: 750 mg via INTRAVENOUS
  Filled 2017-08-01: qty 150

## 2017-08-01 MED ORDER — SODIUM CHLORIDE 0.9 % IV SOLN
1.0000 g | Freq: Two times a day (BID) | INTRAVENOUS | Status: DC
  Administered 2017-08-01 – 2017-08-02 (×4): 1 g via INTRAVENOUS
  Filled 2017-08-01 (×6): qty 1

## 2017-08-01 MED ORDER — ADULT MULTIVITAMIN W/MINERALS CH
1.0000 | ORAL_TABLET | Freq: Every day | ORAL | Status: DC
  Administered 2017-08-02: 1 via ORAL
  Filled 2017-08-01 (×2): qty 1

## 2017-08-01 MED ORDER — BOOST / RESOURCE BREEZE PO LIQD CUSTOM
1.0000 | Freq: Three times a day (TID) | ORAL | Status: DC
Start: 2017-08-01 — End: 2017-08-03
  Administered 2017-08-01 – 2017-08-02 (×3): 1 via ORAL

## 2017-08-01 MED ORDER — BISACODYL 10 MG RE SUPP
10.0000 mg | Freq: Every day | RECTAL | Status: DC | PRN
  Administered 2017-08-01: 10 mg via RECTAL
  Filled 2017-08-01: qty 1

## 2017-08-01 MED ORDER — LEVOFLOXACIN IN D5W 750 MG/150ML IV SOLN: 750.0000 mg | INTRAVENOUS | Status: DC

## 2017-08-01 NOTE — Progress Notes (Signed)
[name] NAME: Ana Washington    MR#:  [id number]  DATE OF BIRTH:  1945-04-14  SUBJECTIVE:  CHIEF COMPLAINT:   Chief Complaint  Patient presents with  . Fall   No complaints.  On home oxygen 2 L. REVIEW OF SYSTEMS:  Review of Systems  Constitutional: Positive for malaise/fatigue. Negative for chills and fever.  HENT: Negative for sore throat.   Eyes: Negative for blurred vision and double vision.  Respiratory: Negative for cough, hemoptysis, shortness of breath, wheezing and stridor.   Cardiovascular: Negative for chest pain, palpitations, orthopnea and leg swelling.  Gastrointestinal: Negative for abdominal pain, blood in stool, diarrhea, melena, nausea and vomiting.  Genitourinary: Negative for dysuria, flank pain and hematuria.  Musculoskeletal: Negative for back pain, falls and joint pain.  Neurological: Negative for dizziness, sensory change, focal weakness, seizures, loss of consciousness, weakness and headaches.  Endo/Heme/Allergies: Negative for polydipsia.  Psychiatric/Behavioral: Negative for depression. The patient is not nervous/anxious.     DRUG ALLERGIES:   Allergies  Allergen Reactions  . Codeine Nausea And Vomiting  . Penicillins Rash    Has patient had a PCN reaction causing immediate rash, facial/tongue/throat swelling, SOB or lightheadedness with hypotension: Unknown Has patient had a PCN reaction causing severe rash involving mucus membranes or skin necrosis: Unknown Has patient had a PCN reaction that required hospitalization: Unknown Has patient had a PCN reaction occurring within the last 10 years: No If all of the above answers are "NO", then may proceed with Cephalosporin use.    VITALS:  Blood pressure 109/64, pulse 78, temperature 97.6 F (36.4 C), temperature source Oral, resp. rate 18, height 5\' 5"  (1.651 m), weight 100 lb (45.4 kg), SpO2 100 %. PHYSICAL EXAMINATION:  Physical Exam    Constitutional: She is oriented to person, place, and time.  Severe malnutrition.  HENT:  Head: Normocephalic.  Mouth/Throat: Oropharynx is clear and moist.  Eyes: Pupils are equal, round, and reactive to light. Conjunctivae and EOM are normal. No scleral icterus.  Neck: Normal range of motion. Neck supple. No JVD present. No tracheal deviation present.  Cardiovascular: Normal rate, regular rhythm and normal heart sounds. Exam reveals no gallop.  No murmur heard. Pulmonary/Chest: Effort normal. No respiratory distress. She has no wheezes. She has no rales.  Bilateral crackles.  Abdominal: Soft. Bowel sounds are normal. She exhibits no distension. There is no tenderness. There is no rebound.  Musculoskeletal: Normal range of motion. She exhibits no edema or tenderness.  Neurological: She is alert and oriented to person, place, and time. No cranial nerve deficit.  Skin: No rash noted. No erythema.   LABORATORY PANEL:  Female CBC Recent Labs  Lab 08/01/17 0505  WBC 31.8*  HGB 11.1*  HCT 33.1*  PLT 307   ------------------------------------------------------------------------------------------------------------------ Chemistries  Recent Labs  Lab 07/31/17 0527  NA 137  K 3.6  CL 95*  CO2 34*  GLUCOSE 101*  BUN 15  CREATININE 0.48  CALCIUM 8.9   RADIOLOGY:  Dg Chest Port 1 View  Result Date: 08/01/2017 CLINICAL DATA:  Several day history of dyspnea. History of bronchiectasis, coronary artery disease with previous MI, nonsmoker. EXAM: PORTABLE CHEST 1 VIEW COMPARISON:  PA and lateral chest x-ray of February 07, 2017 FINDINGS: The lungs are hyperinflated. The interstitial markings are coarse but slightly less conspicuous on the previous study. Stable postsurgical changes in the right upper lobe extending to the right hilum are noted. There is stable  biapical pleural thickening. The heart and pulmonary vascularity are normal. The retrocardiac region is more dense today. There is  calcification in the wall of the thoracic aorta. There is old deformity of the left fifth and sixth ribs. IMPRESSION: Extensive chronic interstitial changes in both lungs. Increased density in the left lower lobe worrisome for atelectasis or pneumonia. Stable postsurgical changes in the upper hemithoraces bilaterally. Thoracic aortic atherosclerosis Electronically Signed   By: David  Martinique M.D.   On: 08/01/2017 09:11   ASSESSMENT AND PLAN:   Pelvic fracture ([place]) -due to mechanical fall,  PRN analgesia, orthopedic consult: Per Dr. Posey Pronto, No surgical intervention required at this time for either the right elbow or pelvis.    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  Continue home oxygen, prednisone and nebulizer as needed.  Continue Zithromax.  PNA, CAP with worsening leukocytosis.    Discontinued Cipro and started meropenem, follow-up with pulmonary consult. Urinalysis normal and follow-up CBC.  The patient will be discharged to home with hospice care with the patient is stable. All the records are reviewed and case discussed with Care Management/Social Worker. Management plans discussed with the patient, her husband, niece and they are in agreement.  CODE STATUS: DNR  TOTAL TIME TAKING CARE OF THIS PATIENT: 32 minutes.   More than 50% of the time was spent in counseling/coordination of care: YES  POSSIBLE D/C IN 1-2 DAYS, DEPENDING ON CLINICAL CONDITION.   Demetrios Loll M.D on 08/01/2017 at 5:02 PM  Between 7am to 6pm - Pager - [phone]  After 6pm go to [email or web address] - [facility]

## 2017-08-01 NOTE — Progress Notes (Signed)
Initial Nutrition Assessment  DOCUMENTATION CODES:   Severe malnutrition in context of chronic illness  INTERVENTION:  Agree with regular diet.  Provide Boost Breeze po TID, each supplement provides 250 kcal and 9 grams of protein.  Provide Magic cup TID with meals, each supplement provides 290 kcal and 9 grams of protein. Patient prefers orange flavor.  Provide daily MVI.  Encouraged adequate intake of calories and protein from meals, snacks, beverages, and ONS. Encouraged patient to choose a source of protein at each meal.  NUTRITION DIAGNOSIS:   Severe Malnutrition related to chronic illness(bronchiectasis, advanced age, inadequate intake) as evidenced by severe fat depletion, severe muscle depletion.  GOAL:   Patient will meet greater than or equal to 90% of their needs  MONITOR:   PO intake, Supplement acceptance, Labs, Weight trends, I & O's  REASON FOR ASSESSMENT:   Other (Comment), Consult(Low BMI) Assessment of nutrition requirement/status  ASSESSMENT:   72 year old female with PMHx of HTN, bronchiectasis, GERD, arthritis, CAD, hx MI, A-fib who is now admitted after mechanical fall at home found to have pelvic fracture.   RD attempted to meet with patient and family members yesterday but they refused the NFPE and any nutrition interventions at that time. After receiving consult today discussed with RN. Patient and family now amenable to discussing nutrition with RD.  Met with patient and family members at bedside. Patient Reports she has had no appetite during the hospital stay because she does not like the food here. Noted her diet was liberalized to regular yesterday. She reports that she had a fairly good appetite PTA because she is on steroids, but family reports she was actually not eating well before admission either. She attempts to eat 3 meals per day but is not able to finish much at meals. She does not like meat or beans so she reports that most days she  does not get a source of protein in her diet. 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 She did not like the Ensure because of the taste. She did not like Premier Protein because she claims it gave her diarrhea. She is amenable to trying Boost Breeze and orange [employer].  Patient reports her UBW had been 135-140 lbs. She then lost down to around 90 lbs at one point in time. Patient is reporting she is up to 110 lbs now, but there are no measured weights in chart. RD unable to obtain a bed scale weight as patient was sitting on chair.  Medications reviewed and include: Colace 100 mg BID, Ativan, pantoprazole, prednisone 10 mg daily with breakfast, Levaquin.  Labs reviewed: Chloride 95, CO2 34.  Discussed with RN.  NUTRITION - FOCUSED PHYSICAL EXAM:    Most Recent Value  Orbital Region  Severe depletion  Upper Arm Region  Severe depletion  Thoracic and Lumbar Region  Unable to assess  Buccal Region  Severe depletion  Temple Region  Severe depletion  Clavicle Bone Region  Severe depletion  Clavicle and Acromion Bone Region  Severe depletion  Scapular Bone Region  Unable to assess  Dorsal Hand  Severe depletion  Patellar Region  Severe depletion  Anterior Thigh Region  Severe depletion  Posterior Calf Region  Severe depletion  Edema (RD Assessment)  None  Hair  Reviewed  Eyes  Unable to assess  Mouth  Unable to assess  Skin  Reviewed  Nails  Reviewed     Diet Order:   Diet Order  Diet regular Room service appropriate? Yes; Fluid consistency: Thin  Diet effective now          EDUCATION NEEDS:   Education needs have been addressed  Skin:  Skin Assessment: Reviewed RN Assessment  Last BM:  08/01/2017 - small type 4  Height:   Ht Readings from Last 1 Encounters:  07/30/17 _0  (1.651 m)    Weight:   Wt Readings from Last 1 Encounters:  07/30/17 100 lb (45.4 kg)    Ideal Body Weight:  56.8 kg  BMI:  Body mass index is 16.64  kg/m.  Estimated Nutritional Needs:   Kcal:  1360-1490 (25-30 kcal/kg)  Protein:  70-80 grams (1.5-1.7 grams/kg)  Fluid:  1.3-1.5 L/day (1 mL/kcal)  Willey Blade, MS, RD, LDN Office: [phone] Pager: [phone] After Hours/Weekend Pager: [phone]

## 2017-08-01 NOTE — Progress Notes (Signed)
PHARMACY NOTE:  ANTIMICROBIAL RENAL DOSAGE ADJUSTMENT  Current antimicrobial regimen includes a mismatch between antimicrobial dosage and estimated renal function.  As per policy approved by the Pharmacy & Therapeutics and Medical Executive Committees, the antimicrobial dosage will be adjusted accordingly.  Current antimicrobial dosage:  Levofloxacin 750mg  q 24hr  Indication: PNA  Renal Function:  Estimated Creatinine Clearance: 45.6 mL/min (by C-G formula based on SCr of 0.48 mg/dL). []      On intermittent HD, scheduled: []      On CRRT    Antimicrobial dosage has been changed to:  Levofloxacin 750mg  q 48hr  Additional comments:   Thank you for allowing pharmacy to be a part of this patient's care.  Ramond Dial, Pharm.D, BCPS Clinical Pharmacist 08/01/2017 11:42 AM

## 2017-08-01 NOTE — Progress Notes (Signed)
Physical Therapy Treatment Patient Details Name: Ana Washington MRN: [id number] DOB: Apr 06, 1945 Today's Date: 08/01/2017    History of Present Illness Pt is a 72 y.o. female presenting to hospital 07/30/17 with R hip and R elbow pain s/p mechanical fall (slipped off bed).  Imaging showing R superior/inferior pubic rami fx's and possible minimally displaced fx line at level of R medial epicondyle.  PMH includes htn, MI, [name], OA B knees, back surgery, foot surgery, kyphoplasty, lung surgery.    PT Comments    Agreed on second attempt with mod encouragement.  Participated in exercises as described below.  To edge of bed with max a x 1 then transferred to recliner with min/mod a x 1.  While standing statically she only required min a for balance but when WB on RLE to step she required mod a x 1 for increased support but overall did quite well despite discomfort.  Remained in recliner for lunch.   Follow Up Recommendations  SNF     Equipment Recommendations       Recommendations for Other Services       Precautions / Restrictions Precautions Precautions: Fall Restrictions Weight Bearing Restrictions: Yes RUE Weight Bearing: Non weight bearing RLE Weight Bearing: Weight bearing as tolerated Other Position/Activity Restrictions: Maintain R UE in arm splint    Mobility  Bed Mobility Overal bed mobility: Needs Assistance Bed Mobility: Supine to Sit     Supine to sit: Max assist Sit to supine: Max assist   General bed mobility comments: assist for trunk and B LE's; increased time to perform d/t R pelvic pain  Transfers Overall transfer level: Needs assistance Equipment used: None Transfers: Sit to/from [facility] Sit to Stand: Mod assist Stand pivot transfers: Mod assist;Min assist       General transfer comment: Overall improved transfer quality this session.  Difficulty with WB on RLE to step requring some increased support but when static standing  required just min a for balance.  Ambulation/Gait             General Gait Details: Teacher, adult education Rankin (Stroke Patients Only)       Balance Overall balance assessment: Needs assistance Sitting-balance support: Single extremity supported;Feet supported Sitting balance-Leahy Scale: Poor Sitting balance - Comments: upon initial sup>sit, required mod-max assit for sitting balance and mod verbal cues to correct, guarding R hip 2:2 pain   Standing balance support: Single extremity supported Standing balance-Leahy Scale: Poor Standing balance comment: poor+, requires assist                            Cognition Arousal/Alertness: Awake/alert Behavior During Therapy: WFL for tasks assessed/performed Overall Cognitive Status: Within Functional Limits for tasks assessed                                        Exercises Other Exercises Other Exercises: supine heel slides and ab/add x 10 AAROM bilaterally    General Comments        Pertinent Vitals/Pain Pain Assessment: Faces Pain Score: 8  Faces Pain Scale: Hurts even more Pain Location: R arm and r pelvic area with WB/stepping Pain Descriptors / Indicators: Sore;Grimacing Pain Intervention(s): Limited activity within patient's tolerance;Premedicated before session  Home Living Family/patient expects to be discharged to:: Private residence Living Arrangements: Spouse/significant other Available Help at Discharge: Family Type of Home: House Home Access: [place]: [address]: Clinical cytogeneticist - 2 wheels;Walker - 4 wheels;Adaptive equipment      Prior Function Level of Independence: Independent with assistive device(s)      Comments: Pt ambulating without AD prior to admission.  No other falls in past 6 months.  Pt's husband uses RW and requires some assist (pt's daughter staying to help  currently).  On 2 L home O2.   PT Goals (current goals can now be found in the care plan section) Acute Rehab PT Goals Patient Stated Goal: have less pain and return to PLOF Progress towards PT goals: Progressing toward goals    Frequency    7X/week      PT Plan Current plan remains appropriate    Co-evaluation              AM-PAC PT "6 Clicks" Daily Activity  Outcome Measure  Difficulty turning over in bed (including adjusting bedclothes, sheets and blankets)?: Unable Difficulty moving from lying on back to sitting on the side of the bed? : Unable Difficulty sitting down on and standing up from a chair with arms (e.g., wheelchair, bedside commode, etc,.)?: Unable Help needed moving to and from a bed to chair (including a wheelchair)?: A Lot Help needed walking in hospital room?: Total Help needed climbing 3-5 steps with a railing? : Total 6 Click Score: 7    End of Session Equipment Utilized During Treatment: Gait belt Activity Tolerance: Patient limited by pain Patient left: in chair;with call bell/phone within reach;with chair alarm set;with family/visitor present   Pain - Right/Left: Right Pain - part of body: Hip     Time: 1050-1108 PT Time Calculation (min) (ACUTE ONLY): 18 min  Charges:  $Therapeutic Activity: 8-22 mins                    G Codes:       Chesley Noon, PTA 08/01/17, 11:58 AM

## 2017-08-01 NOTE — Progress Notes (Signed)
Rept to Dr. Bridgett Larsson that pt did not void on night shift. Night shift bladder scanned pt at approx 05:30 for 150 cc. Pt this AM states that she "can go when she gets up". Night nurse stated that pt didn't drink a lot of fluids last night. Pt instructed to increase fluid intake as much as possible. Pt verbalizes understanding and agrees to comply. Pt's WBC elevated this AM. Dr. Bridgett Larsson aware of all. Orders received. Per his order, pulmonary consult placed with Dr. Juanell Fairly. Nutrition consult placed per Dr. Lianne Moris order. Will continue to monitor.

## 2017-08-01 NOTE — Evaluation (Signed)
Occupational Therapy Evaluation Patient Details Name: Ana Washington MRN: [id number] DOB: 1945-11-21 Today's Date: 08/01/2017    History of Present Illness Pt is a 72 y.o. female presenting to hospital 07/30/17 with R hip and R elbow pain s/p mechanical fall (slipped off bed).  Imaging showing R superior/inferior pubic rami fx's and possible minimally displaced fx line at level of R medial epicondyle.  PMH includes htn, MI, [name], OA B knees, back surgery, foot surgery, kyphoplasty, lung surgery.   Clinical Impression   Pt seen for OT evaluation this date, POD#1 from above surgery. Pt was generally independent in all ADLs prior to surgery (except took seated shower with hospice worker assist 2x/wk), required assist for driving, indep with med mgt, and denies any other falls aside from one that led to this admission. She denies use of AD for mobility, however, endorses holding on to furniture or walls for stability at home. Pt has a 2WW and rollator available to her at home as well as reachers. Pt is eager to return to PLOF with less pain and improved safety and independence. Pt currently requires max assist for bed mobility, mod-max assist for transfers, and max assist for LB ADL and toileting due to pain and limited AROM of R hip, poor balance, decreased activity tolerance, and generalized weakness, as well as impaired RUE functional use while immobilized. Pt instructed in functional transfers for [facility] toileting task. Pt would benefit from skilled OT services to address self care skills, falls prevention strategies, home/routines modifications, DME/AE for LB bathing and dressing tasks, and functional mobility training to help maintain precautions with or without assistive devices and support recall and carryover prior to discharge. Recommend STR upon discharge.      Follow Up Recommendations  SNF    Equipment Recommendations  3 in 1 bedside commode    Recommendations for Other Services        Precautions / Restrictions Precautions Precautions: Fall Restrictions Weight Bearing Restrictions: Yes RUE Weight Bearing: Non weight bearing RLE Weight Bearing: Weight bearing as tolerated Other Position/Activity Restrictions: Maintain R UE in arm splint      Mobility Bed Mobility Overal bed mobility: Needs Assistance Bed Mobility: Supine to Sit;Sit to Supine     Supine to sit: Max assist;HOB elevated Sit to supine: Max assist   General bed mobility comments: assist for trunk and B LE's; increased time to perform d/t R pelvic pain  Transfers Overall transfer level: Needs assistance Equipment used: Rolling walker (2 wheeled) Transfers: Sit to/from [facility] Sit to Stand: Mod assist;Max assist Stand pivot transfers: Mod assist;Max assist       General transfer comment: VC to come to full stand, L hand on RW and OT supporting R side of RW    Balance Overall balance assessment: Needs assistance Sitting-balance support: Single extremity supported;Feet supported Sitting balance-Leahy Scale: Poor Sitting balance - Comments: upon initial sup>sit, required mod-max assit for sitting balance and mod verbal cues to correct, guarding R hip 2:2 pain   Standing balance support: Single extremity supported Standing balance-Leahy Scale: Poor Standing balance comment: poor+, requires assist                           ADL either performed or assessed with clinical judgement   ADL Overall ADL's : Needs assistance/impaired Eating/Feeding: Sitting;Minimal assistance Eating/Feeding Details (indicate cue type and reason): could perform with min assist using non-dom hand, but family in room stating they will  feed her ("we spoil our [employer]"); encouraged pt to attempt to self feed for lunch with L non-dom hand to support maintaining strength and improving activity tolerance Grooming: Sitting;Minimal assistance   Upper Body Bathing: Sitting;Moderate assistance    Lower Body Bathing: Sit to/from stand;Maximal assistance   Upper Body Dressing : Sitting;Moderate assistance   Lower Body Dressing: Sit to/from stand;Maximal assistance   Toilet Transfer: RW;Stand-pivot;BSC;Adhering to hip precautions;Maximal assistance;Moderate assistance Toilet Transfer Details (indicate cue type and reason): mod-max +1, SBA from CNA, OT facilitated movement of RW and provided pt with VC  Toileting- Clothing Manipulation and Hygiene: Sit to/from stand;Maximal assistance Toileting - Clothing Manipulation Details (indicate cue type and reason): max assist from CNA while OT assisted pt in standing             Vision Baseline Vision/History: Wears glasses Wears Glasses: Reading only Patient Visual Report: No change from baseline       Perception     Praxis      Pertinent Vitals/Pain Pain Assessment: 0-10 Pain Score: 8  Pain Location: R pelvis with  mobility, 0/10 pain at rest Pain Descriptors / Indicators: Sore;Aching Pain Intervention(s): Limited activity within patient's tolerance;Monitored during session;Premedicated before session;Repositioned     Hand Dominance Right   Extremity/Trunk Assessment Upper Extremity Assessment Upper Extremity Assessment: RUE deficits/detail;Generalized weakness(LUE grossly at least 4-/5) RUE Deficits / Details: RUE NWBing, in sling RUE: Unable to fully assess due to immobilization;Unable to fully assess due to pain RUE Sensation: WNL RUE Coordination: decreased gross motor   Lower Extremity Assessment Lower Extremity Assessment: Defer to PT evaluation;RLE deficits/detail;LLE deficits/detail RLE Deficits / Details: at least 3-/5 hip flexion; at least 3+/5 knee flexion/extension and DF LLE Deficits / Details: at least 3/5 hip flexion; at least 3+/5 knee flexion/extension and DF   Cervical / Trunk Assessment Cervical / Trunk Assessment: Normal   Communication Communication Communication: No difficulties   Cognition  Arousal/Alertness: Awake/alert Behavior During Therapy: WFL for tasks assessed/performed Overall Cognitive Status: Within Functional Limits for tasks assessed                                     General Comments       Exercises     Shoulder Instructions      Home Living Family/patient expects to be discharged to:: Private residence Living Arrangements: Spouse/significant other Available Help at Discharge: Family Type of Home: House Home Access: Ramped entrance     Home Layout: One level     Bathroom Shower/Tub: Teacher, early years/pre: Standard     Home Equipment: Clinical cytogeneticist - 2 wheels;Walker - 4 wheels;Adaptive equipment Adaptive Equipment: Reacher        Prior Functioning/Environment Level of Independence: Independent with assistive device(s)        Comments: Pt ambulating without AD prior to admission.  No other falls in past 6 months.  Pt's husband uses RW and requires some assist (pt's daughter staying to help currently).  On 2 L home O2.        OT Problem List: Decreased strength;Decreased knowledge of use of DME or AE;Impaired UE functional use;Decreased activity tolerance;Pain;Impaired balance (sitting and/or standing);Decreased range of motion      OT Treatment/Interventions: Self-care/ADL training;Balance training;Therapeutic exercise;Therapeutic activities;DME and/or AE instruction;Patient/family education    OT Goals(Current goals can be found in the care plan section) Acute Rehab OT Goals Patient Stated Goal: have  less pain and return to PLOF OT Goal Formulation: With patient Time For Goal Achievement: 08/15/17 Potential to Achieve Goals: Good ADL Goals Pt Will Perform Lower Body Dressing: sit to/from stand;with min assist;with adaptive equipment Pt Will Transfer to Toilet: with min assist;ambulating(elevated commode w/ rails, LRAD for amb) Additional ADL Goal #1: Pt will maintain NWBing precautions to RUE while  performing self care tasks with no external cues, 5/5 opportunities to maximize adherence to surgeon's precautions.  OT Frequency: Min 1X/week   Barriers to D/C:            Co-evaluation              AM-PAC PT "6 Clicks" Daily Activity     Outcome Measure Help from another person eating meals?: A Little Help from another person taking care of personal grooming?: A Little Help from another person toileting, which includes using toliet, bedpan, or urinal?: A Lot Help from another person bathing (including washing, rinsing, drying)?: A Lot Help from another person to put on and taking off regular upper body clothing?: A Lot Help from another person to put on and taking off regular lower body clothing?: A Lot 6 Click Score: 14   End of Session Equipment Utilized During Treatment: Gait belt;Rolling walker;Oxygen  Activity Tolerance: Patient tolerated treatment well Patient left: in bed;with call bell/phone within reach;with bed alarm set;with family/visitor present  OT Visit Diagnosis: Other abnormalities of gait and mobility (R26.89);Muscle weakness (generalized) (M62.81);History of falling (Z91.81);Pain Pain - Right/Left: Right Pain - part of body: Arm;Hip                Time: 4562-5638 OT Time Calculation (min): 47 min Charges:  OT General Charges $OT Visit: 1 Visit OT Evaluation $OT Eval Moderate Complexity: 1 Mod OT Treatments $Self Care/Home Management : 23-37 mins   Jeni Salles, MPH, MS, OTR/L ascom [phone] 08/01/17, 9:25 AM

## 2017-08-01 NOTE — Progress Notes (Signed)
Clinical Social Worker (CSW) met with patient and her husband Fritz Pickerel and daughter Marlowe Kays were at bedside. Per patient she does not want to go to SNF and would like to return home and continue hospice through community hospice. Patient's husband and daughter are in agreement with patient coming home. RN case manager aware of above. CSW made Freight forwarder of community hospice aware of above. Please reconsult if future social work needs arise. CSW signing off.   [employer], LCSW [phone]

## 2017-08-01 NOTE — Care Management Note (Signed)
Case Management Note  Patient Details  Name: MARGURITE DUFFY MRN: [id number] Date of Birth: Mar 02, 1945  Subjective/Objective:                  RNCM spoke with patient, her husband and her daughter Marlowe Kays regarding transition of care. Patient would like to return to home followed by [facility] [phone] fax [phone]. She believes she can ride in car to get home.  She has a wheelchair ramp however at baseline patient can usually walk with her walker ~50 feet. She lives in a one story home with her husband. Marlowe Kays plans to assist them in the home.  She is also on chronic O2 through hospice.  Action/Plan:   RNCM spoke with [facility] and they can take patient back but RNCM will need to fax orders when they are available.    Expected Discharge Date:  08/02/17               Expected Discharge Plan:     In-House Referral:     Discharge planning Services  CM Consult  Post Acute Care Choice:  Hospice Choice offered to:  Patient  DME Arranged:    DME Agency:     HH Arranged:    [place]  Status of Service:  In process, will continue to follow  If discussed at Long Length of Stay Meetings, dates discussed:    Additional Comments:  Marshell Garfinkel, RN 08/01/2017, 10:50 AM

## 2017-08-02 DIAGNOSIS — J471 Bronchiectasis with (acute) exacerbation: Secondary | ICD-10-CM

## 2017-08-02 LAB — EXPECTORATED SPUTUM ASSESSMENT W GRAM STAIN, RFLX TO RESP C

## 2017-08-02 LAB — CBC
HCT: 30.8 % — ABNORMAL LOW (ref 35.0–47.0)
Hemoglobin: 10.5 g/dL — ABNORMAL LOW (ref 12.0–16.0)
MCH: 33.7 pg (ref 26.0–34.0)
MCHC: 34.1 g/dL (ref 32.0–36.0)
MCV: 98.7 fL (ref 80.0–100.0)
Platelets: 244 10*3/uL (ref 150–440)
RBC: 3.12 MIL/uL — ABNORMAL LOW (ref 3.80–5.20)
RDW: 12.2 % (ref 11.5–14.5)
WBC: 18.3 10*3/uL — ABNORMAL HIGH (ref 3.6–11.0)

## 2017-08-02 MED ORDER — TRAMADOL HCL 50 MG PO TABS
50.0000 mg | ORAL_TABLET | Freq: Four times a day (QID) | ORAL | Status: DC | PRN
  Administered 2017-08-02 – 2017-08-03 (×2): 50 mg via ORAL
  Filled 2017-08-02 (×2): qty 1

## 2017-08-02 NOTE — Care Management Important Message (Signed)
Important Message  Patient Details  Name: Ana Washington MRN: [id number] Date of Birth: [birth year]   Medicare Important Message Given:  Yes    Ana Washington 08/02/2017, 11:14 AM

## 2017-08-02 NOTE — Progress Notes (Signed)
[facility] NAME: Ana Washington    MR#:  [id number]  DATE OF BIRTH:  03/29/1945  SUBJECTIVE:  CHIEF COMPLAINT:   Chief Complaint  Patient presents with  . Fall   The patient has pelvic pain and a lot of cough.  But she cannot cough out phlegm probably due to pelvic pain. REVIEW OF SYSTEMS:  Review of Systems  Constitutional: Positive for malaise/fatigue. Negative for chills and fever.  HENT: Negative for sore throat.   Eyes: Negative for blurred vision and double vision.  Respiratory: Positive for cough. Negative for hemoptysis, shortness of breath, wheezing and stridor.   Cardiovascular: Negative for chest pain, palpitations, orthopnea and leg swelling.  Gastrointestinal: Negative for abdominal pain, blood in stool, diarrhea, melena, nausea and vomiting.  Genitourinary: Negative for dysuria, flank pain and hematuria.  Musculoskeletal: Positive for joint pain. Negative for back pain and falls.  Skin: Positive for rash.  Neurological: Negative for dizziness, sensory change, focal weakness, seizures, loss of consciousness, weakness and headaches.  Endo/Heme/Allergies: Negative for polydipsia.  Psychiatric/Behavioral: Negative for depression. The patient is not nervous/anxious.     DRUG ALLERGIES:   Allergies  Allergen Reactions  . Codeine Nausea And Vomiting  . Penicillins Rash    Has patient had a PCN reaction causing immediate rash, facial/tongue/throat swelling, SOB or lightheadedness with hypotension: Unknown Has patient had a PCN reaction causing severe rash involving mucus membranes or skin necrosis: Unknown Has patient had a PCN reaction that required hospitalization: Unknown Has patient had a PCN reaction occurring within the last 10 years: No If all of the above answers are "NO", then may proceed with Cephalosporin use.    VITALS:  Blood pressure 138/60, pulse 89, temperature 98.6 F (37 C), temperature source  Axillary, resp. rate 16, height 5\' 5"  (1.651 m), weight 100 lb (45.4 kg), SpO2 100 %. PHYSICAL EXAMINATION:  Physical Exam  Constitutional: She is oriented to person, place, and time.  Severe malnutrition.  HENT:  Head: Normocephalic.  Mouth/Throat: Oropharynx is clear and moist.  Eyes: Pupils are equal, round, and reactive to light. Conjunctivae and EOM are normal. No scleral icterus.  Neck: Normal range of motion. Neck supple. No JVD present. No tracheal deviation present.  Cardiovascular: Normal rate, regular rhythm and normal heart sounds. Exam reveals no gallop.  No murmur heard. Pulmonary/Chest: Effort normal. No respiratory distress. She has no wheezes. She has no rales.  Better bilateral crackles.  Abdominal: Soft. Bowel sounds are normal. She exhibits no distension. There is no tenderness. There is no rebound.  Musculoskeletal: Normal range of motion. She exhibits no edema or tenderness.  Neurological: She is alert and oriented to person, place, and time. No cranial nerve deficit.  Skin: No rash noted. No erythema.   LABORATORY PANEL:  Female CBC Recent Labs  Lab 08/02/17 0450  WBC 18.3*  HGB 10.5*  HCT 30.8*  PLT 244   ------------------------------------------------------------------------------------------------------------------ Chemistries  Recent Labs  Lab 07/31/17 0527  NA 137  K 3.6  CL 95*  CO2 34*  GLUCOSE 101*  BUN 15  CREATININE 0.48  CALCIUM 8.9   RADIOLOGY:  No results found. ASSESSMENT AND PLAN:   Pelvic fracture ([place]) -due to mechanical fall,  PRN analgesia, orthopedic consult: Per Dr. Posey Pronto, No surgical intervention required at this time for either the right elbow or pelvis.    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  Continue home oxygen, prednisone and nebulizer as needed.  Continue Zithromax.  PNA, CAP with worsening leukocytosis.    Discontinued Cipro and started meropenem.  Follow-up CBC. Per Dr. Felicie Morn, try to get in at least one session of nebulized hypertonic saline followed by percussion vest per day.    Follow-up with him in 3 months.  The patient will be discharged to home with hospice care with the patient is stable. All the records are reviewed and case discussed with Care Management/Social Worker. Management plans discussed with the patient, her husband, niece and they are in agreement.  CODE STATUS: DNR  TOTAL TIME TAKING CARE OF THIS PATIENT: 33 minutes.   More than 50% of the time was spent in counseling/coordination of care: YES  POSSIBLE D/C IN 1-2 DAYS, DEPENDING ON CLINICAL CONDITION.   Demetrios Loll M.D on 08/02/2017 at 1:24 PM  Between 7am to 6pm - Pager - [phone]  After 6pm go to [email or web address] - [facility]

## 2017-08-02 NOTE — Consult Note (Signed)
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 History of MAC, currently inactive. Chronic hypoxic respiratory failure with severe baseline dyspnea.  - I recommended to the patient that she continue to try to do airway clearance, this is difficult due to her recent fracture which makes her hip hurt when she coughs.  Therefore I recommended that she take her pain medication first and then try to get in at least one session of nebulized hypertonic saline followed by percussion vest per day.  She should try to increase this as tolerated by her pain level. -Follow-up outpatient with me in approximately 3 months, she is asked to call us sooner if her respiratory status begins to decline.   Date: 08/02/2017  MRN# [id number] Ana Washington [birth year]  Referring Physician:   MACKINLEY CASSADAY is a 72 y.o. old female seen in consultation for chief complaint of:    Chief Complaint  Patient presents with  . Fall    HPI:   Patient is a 72 year old female, well-known to our service.  She is followed in the office for end-stage/very severe bronchiectasis due to history of MAC.  She had been treated with multiple rounds of therapy, she has intolerance to some of the medication to the regimen, therefore they were stopped, repeat cultures have been negative for MAC.  She was enrolled in hospice, but improved significantly with prescription for percussion vest and aggressive airway clearance.  However she continues to have severe baseline dyspnea at rest for which she takes Ativan around-the-clock which seems to help significantly.  She is maintained on prednisone 10 mg daily, as well as ciprofloxacin and azithromycin with hypertonic saline. The patient presented to the ED on 07/03/2017 after a fall at home.  She is maintained on Eliquis due to atrial fibrillation.  The patient fell on her right side, in the ER  evaluation was positive for right superior and inferior pubic rami and moderate joint effusion of the right elbow.  The patient was seen by orthopedic surgery on 7/1, it was deemed that the patient could be treated nonsurgically.  Currently the patient is being maintained on 1 L nasal cannula.  She feels that her breathing is doing okay, she is is not wanting to do her percussion vest and hypertonic saline because this makes her cough and her hip hurts when this happens.    PMHX:   Past Medical History:  Diagnosis Date  . Arthritis   . Atrial fibrillation ([place])   . Bronchiectasis ([place])   . CAD (coronary artery disease)   . CAD (coronary artery disease) 07/30/2017  . Dyspnea   . 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 GERD (gastroesophageal reflux disease)   . Hypertension   . Lung disease   . Myocardial infarction ([place])   . [name] (postoperative nausea and vomiting)    Surgical Hx:  Past Surgical History:  Procedure Laterality Date  . BACK SURGERY    . CHOLECYSTECTOMY    . FOOT SURGERY    . KYPHOPLASTY N/A 07/05/2016   Procedure: KYPHOPLASTY T - 9;  Surgeon: Hessie Knows, MD;  Location: [facility] ORS;  Service: Orthopedics;  Laterality: N/A;  . LUNG SURGERY  1990 and 1996   Family Hx:  Family History  Problem Relation Age of Onset  . Hypertension Mother   . Hypertension Father    Social Hx:   Social History   Tobacco Use  . Smoking  status: Never Smoker  . Smokeless tobacco: Never Used  Substance Use Topics  . Alcohol use: No  . Drug use: No   Medication:    Current Facility-Administered Medications:  .  acetaminophen (TYLENOL) tablet 650 mg, 650 mg, Oral, Q6H PRN, 650 mg at 08/02/17 0506 **OR** acetaminophen (TYLENOL) suppository 650 mg, 650 mg, Rectal, Q6H PRN, Lance Coon, MD .  alum & mag hydroxide-simeth (MAALOX/MYLANTA) 200-200-20 MG/5ML suspension 30 mL, 30 mL, Oral, Q4H PRN, Lance Coon, MD, 30 mL at 08/01/17 1131 .  amLODipine (NORVASC) tablet 5 mg, 5 mg,  Oral, Daily, Lance Coon, MD, 5 mg at 08/01/17 1019 .  apixaban (ELIQUIS) tablet 5 mg, 5 mg, Oral, BID, Lance Coon, MD, 5 mg at 08/01/17 2054 .  aspirin EC tablet 81 mg, 81 mg, Oral, Daily, Lance Coon, MD, 81 mg at 08/01/17 1018 .  bisacodyl (DULCOLAX) EC tablet 5 mg, 5 mg, Oral, Daily PRN, Demetrios Loll, MD .  bisacodyl (DULCOLAX) suppository 10 mg, 10 mg, Rectal, Daily PRN, Demetrios Loll, MD, 10 mg at 08/01/17 1407 .  chlorpheniramine-HYDROcodone (TUSSIONEX) 10-8 MG/5ML suspension 6 mL, 6 mL, Oral, QHS, Lance Coon, MD, 6 mL at 08/01/17 2055 .  diltiazem (CARDIZEM) tablet 30 mg, 30 mg, Oral, BID PRN, Lance Coon, MD .  docusate sodium (COLACE) capsule 100 mg, 100 mg, Oral, BID, Demetrios Loll, MD, 100 mg at 08/01/17 2054 .  feeding supplement (BOOST / RESOURCE BREEZE) liquid 1 Container, 1 Container, Oral, TID BM, Demetrios Loll, MD, 1 Container at 08/01/17 2055 .  guaiFENesin (MUCINEX) 12 hr tablet 600 mg, 600 mg, Oral, BID, Lance Coon, MD, 600 mg at 08/01/17 2054 .  guaiFENesin (ROBITUSSIN) 100 MG/5ML solution 100 mg, 5 mL, Oral, Q4H PRN, Demetrios Loll, MD, 100 mg at 07/31/17 1419 .  LORazepam (ATIVAN) tablet 0.5-1 mg, 0.5-1 mg, Oral, Q4H, Lance Coon, MD, 0.5 mg at 08/02/17 0506 .  losartan (COZAAR) tablet 100 mg, 100 mg, Oral, Daily, Lance Coon, MD, 100 mg at 08/01/17 1018 .  meropenem (MERREM) 1 g in sodium chloride 0.9 % 100 mL IVPB, 1 g, Intravenous, Q12H, Demetrios Loll, MD, Stopped at 08/01/17 2125 .  metoprolol succinate (TOPROL-XL) 24 hr tablet 50 mg, 50 mg, Oral, Daily, Lance Coon, MD, 50 mg at 08/01/17 1018 .  multivitamin with minerals tablet 1 tablet, 1 tablet, Oral, Daily, Demetrios Loll, MD .  ondansetron [facility]) tablet 4 mg, 4 mg, Oral, Q6H PRN **OR** ondansetron (ZOFRAN) injection 4 mg, 4 mg, Intravenous, Q6H PRN, Lance Coon, MD, 4 mg at 07/31/17 0853 .  oxyCODONE (Oxy IR/ROXICODONE) immediate release tablet 5 mg, 5 mg, Oral, Q4H PRN, Lance Coon, MD, 5 mg at 07/31/17  1250 .  pantoprazole (PROTONIX) EC tablet 40 mg, 40 mg, Oral, Daily, Lance Coon, MD, 40 mg at 08/01/17 1019 .  predniSONE (DELTASONE) tablet 10 mg, 10 mg, Oral, Q breakfast, Lance Coon, MD, 10 mg at 08/01/17 1018   Allergies:  Codeine and Penicillins  Review of Systems: Gen:  Denies  fever, sweats, chills HEENT: Denies blurred vision, double vision. bleeds, sore throat Cvc:  No dizziness, chest pain. Resp:   Denies cough or sputum production, shortness of breath Gi: Denies swallowing difficulty, stomach pain. Gu:  Denies bladder incontinence, burning urine Ext:   No Joint pain, stiffness. Skin: No skin rash,  hives  Endoc:  No polyuria, polydipsia. Psych: No depression, insomnia. Other:  All other systems were reviewed with the patient and were negative other that what is mentioned in  the HPI.   Physical Examination:   VS: BP 125/80 (BP Location: Left Arm)   Pulse 63   Temp 97.8 F (36.6 C) (Oral)   Resp 16   Ht 5' 5"  (1.651 m)   Wt 100 lb (45.4 kg)   SpO2 100%   BMI 16.64 kg/m   General Appearance: No distress  Neuro:without focal findings,  speech normal,  HEENT: PERRLA, EOM intact.   Pulmonary: Scattered bilateral rhonchi CardiovascularNormal S1,S2.  No m/r/g.   Abdomen: Benign, Soft, non-tender. Renal:  No costovertebral tenderness  GU:  No performed at this time. Endoc: No evident thyromegaly, no signs of acromegaly. Skin:   warm, no rashes, no ecchymosis  Extremities: normal, no cyanosis, clubbing.  Other findings:    LABORATORY PANEL:   CBC Recent Labs  Lab 08/02/17 0450  WBC 18.3*  HGB 10.5*  HCT 30.8*  PLT 244   ------------------------------------------------------------------------------------------------------------------  Chemistries  Recent Labs  Lab 07/31/17 0527  NA 137  K 3.6  CL 95*  CO2 34*  GLUCOSE 101*  BUN 15  CREATININE 0.48  CALCIUM 8.9    ------------------------------------------------------------------------------------------------------------------  Cardiac Enzymes No results for input(s): TROPONINI in the last 168 hours. ------------------------------------------------------------  RADIOLOGY:  Ct Elbow Right Wo Contrast  Result Date: 07/31/2017 CLINICAL DATA:  Medial elbow pain after fall. EXAM: CT OF THE LOWER RIGHT EXTREMITY WITHOUT CONTRAST TECHNIQUE: Multidetector CT imaging of the right lower extremity was performed according to the standard protocol. COMPARISON:  Right elbow x-rays from yesterday. FINDINGS: Bones/Joint/Cartilage No definite fracture. No dislocation. Small joint effusion. Joint spaces are preserved. Ligaments Suboptimally assessed by CT. Muscles and Tendons Unremarkable. Soft tissues Small amount of soft tissue swelling over the posteromedial elbow. No soft tissue mass or fluid collection. IMPRESSION: 1. Small joint effusion, suggesting occult nondisplaced fracture. No discrete fracture identified. Electronically Signed   By: Titus Dubin M.D.   On: 07/31/2017 13:57   Dg Chest Port 1 View  Result Date: 08/01/2017 CLINICAL DATA:  Several day history of dyspnea. History of bronchiectasis, coronary artery disease with previous MI, nonsmoker. EXAM: PORTABLE CHEST 1 VIEW COMPARISON:  PA and lateral chest x-ray of February 07, 2017 FINDINGS: The lungs are hyperinflated. The interstitial markings are coarse but slightly less conspicuous on the previous study. Stable postsurgical changes in the right upper lobe extending to the right hilum are noted. There is stable biapical pleural thickening. The heart and pulmonary vascularity are normal. The retrocardiac region is more dense today. There is calcification in the wall of the thoracic aorta. There is old deformity of the left fifth and sixth ribs. IMPRESSION: Extensive chronic interstitial changes in both lungs. Increased density in the left lower lobe worrisome for  atelectasis or pneumonia. Stable postsurgical changes in the upper hemithoraces bilaterally. Thoracic aortic atherosclerosis Electronically Signed   By: David  Martinique M.D.   On: 08/01/2017 09:11       Thank  you for the consultation and for allowing [facility] to assist in the care of your patient. Our recommendations are noted above.  Please contact us if we can be of further service.   Marda Stalker, M.D., F.C.C.P.  Board Certified in Internal Medicine, Pulmonary Medicine, [place], and Sleep Medicine.  [facility] Office Number: [phone]   08/02/2017

## 2017-08-02 NOTE — Progress Notes (Signed)
Physical Therapy Treatment Patient Details Name: Ana Washington MRN: [id number] DOB: [birth year] Today's Date: 08/02/2017    History of Present Illness Pt is a 72 y.o. female presenting to hospital 07/30/17 with R hip and R elbow pain s/p mechanical fall (slipped off bed).  Imaging showing R superior/inferior pubic rami fx's and possible minimally displaced fx line at level of R medial epicondyle.  PMH includes htn, MI, [name], OA B knees, back surgery, foot surgery, kyphoplasty, lung surgery.    PT Comments    Pt agreeable to PT; reports 9/10 pain in pelvis/RLE and RUE with movement. Pt anxious and requires Max A for bed mobility, Mod A for STS transfers, and Min A for small steps bed to chair. Pt educated on slowing breathing throughout session for pain management. Difficulty with movement of BLEs in supine and sit for exercises requiring assist as well and increased guarding keeping B hip/knees in flexed position despite cues/attempted assist. Pt received up in chair. Attempt hemi walker next session. Continue PT to progress strength, endurance, and active/active assisted range to improve all functional mobility.    Follow Up Recommendations  SNF     Equipment Recommendations  Other (comment)(hemi walker)    Recommendations for Other Services       Precautions / Restrictions Precautions Precautions: Fall Restrictions Weight Bearing Restrictions: Yes RUE Weight Bearing: Non weight bearing RLE Weight Bearing: Weight bearing as tolerated    Mobility  Bed Mobility Overal bed mobility: Needs Assistance Bed Mobility: Supine to Sit     Supine to sit: Max assist     General bed mobility comments: Increased time, cueing, and effort. Assist for trunk and LEs. Pt unable to effectively straighten legs to place over the edge of bed. Max A to transfer pt in tuck position to sitting after several attempts to have increased pt participation  Transfers Overall transfer level: Needs  assistance Equipment used: None Transfers: Sit to/from Stand Sit to Stand: Mod assist;From elevated surface         General transfer comment: Yells in pain initially; anxious/fearful. Once in stand able to stand with Min A and slight posterior lean  Ambulation/Gait Ambulation/Gait assistance: Min assist Gait Distance (Feet): 2 Feet Assistive device: None       General Gait Details: Several small steps bed to chair with LUE supported on therapist arm; unable to balance without assist   Stairs             Wheelchair Mobility    Modified Rankin (Stroke Patients Only)       Balance Overall balance assessment: Needs assistance Sitting-balance support: Feet supported;Single extremity supported Sitting balance-Leahy Scale: Fair     Standing balance support: Single extremity supported;During functional activity Standing balance-Leahy Scale: Poor Standing balance comment: mild posterior lean; unsteady                            Cognition Arousal/Alertness: Awake/alert Behavior During Therapy: WFL for tasks assessed/performed;Anxious Overall Cognitive Status: Within Functional Limits for tasks assessed                                        Exercises General Exercises - Lower Extremity Ankle Circles/Pumps: AROM;Both;10 reps Quad Sets: Strengthening;Both;10 reps Gluteal Sets: Other (comment)(unable due to pain) Long Arc Quad: AROM;Both;10 reps(partial range B) Heel Slides: Other (comment)(attempted; pt keeps BLE tucked,  unable d/t pain) Hip ABduction/ADduction: Other (comment)(attempted in hooklying position; resists movement) Hip Flexion/Marching: AAROM;Both;10 reps;Seated(partial range) Controlled breathing for pain management   General Comments        Pertinent Vitals/Pain Pain Assessment: 0-10 Pain Score: 9 (with movement/weight bearing) Pain Location: RLE/pelvis; R arm without support Pain Descriptors / Indicators:  Grimacing;Moaning;Other (Comment)(several yells in pain) Pain Intervention(s): Limited activity within patient's tolerance;Monitored during session;Repositioned    Home Living                      Prior Function            PT Goals (current goals can now be found in the care plan section) Progress towards PT goals: Progressing toward goals(slowly)    Frequency    7X/week      PT Plan Current plan remains appropriate    Co-evaluation              AM-PAC PT "6 Clicks" Daily Activity  Outcome Measure  Difficulty turning over in bed (including adjusting bedclothes, sheets and blankets)?: Unable Difficulty moving from lying on back to sitting on the side of the bed? : Unable Difficulty sitting down on and standing up from a chair with arms (e.g., wheelchair, bedside commode, etc,.)?: Unable Help needed moving to and from a bed to chair (including a wheelchair)?: A Lot Help needed walking in hospital room?: Total Help needed climbing 3-5 steps with a railing? : Total 6 Click Score: 7    End of Session Equipment Utilized During Treatment: Gait belt Activity Tolerance: Patient limited by pain Patient left: in chair;with call bell/phone within reach;with chair alarm set;with family/visitor present   PT Visit Diagnosis: Other abnormalities of gait and mobility (R26.89);Muscle weakness (generalized) (M62.81);History of falling (Z91.81);Difficulty in walking, not elsewhere classified (R26.2);Pain Pain - Right/Left: Right Pain - part of body: Hip     Time: 1224-8250 PT Time Calculation (min) (ACUTE ONLY): 45 min  Charges:  $Gait Training: 8-22 mins $Therapeutic Exercise: 8-22 mins $Therapeutic Activity: 8-22 mins                    G Codes:        Larae Grooms, PTA 08/02/2017, 12:28 PM

## 2017-08-02 NOTE — Progress Notes (Signed)
Pt requesting something stronger than Tylenol but not as strong as Oxycode. MD Bridgett Larsson notified. Orders received for Tramadol 50 mg Q6 PRN. Order placed.

## 2017-08-02 NOTE — Progress Notes (Signed)
Per Manuela Schwartz with [facility] a hospital bed will be delivered today.   [employer], LCSW [phone]

## 2017-08-02 NOTE — Progress Notes (Signed)
OT Cancellation Note  Patient Details Name: Ana Washington MRN: [id number] DOB: [birth year]   Cancelled Treatment:    Reason Eval/Treat Not Completed: Pain limiting ability to participate. Upon attempt around 1:30pm pt reports just having received pain meds right before OT came in. Visibly grimacing and wincing any time she has to cough. Pt declining OT services at this time. Will re-attempt at later date/time as pt is able and medically appropriate.   Jeni Salles, MPH, MS, OTR/L ascom [phone] 08/02/17, 2:12 PM

## 2017-08-02 NOTE — Care Management (Signed)
Spoke with patient spouse and he confirmed that he did want patient to have a hospital bed. TC to [facility]a [facility] with [facility] and she states that she is working on getting patient a bed but it will have to be once patient has to be discharged. Manuela Schwartz said they do not need anything from [facility] to get the bed.  I informed her that it is anticipated that patient is to discharge tomorrow 7/4. Gave her RNCM contact information.

## 2017-08-03 DIAGNOSIS — E440 Moderate protein-calorie malnutrition: Secondary | ICD-10-CM

## 2017-08-03 DIAGNOSIS — E43 Unspecified severe protein-calorie malnutrition: Secondary | ICD-10-CM

## 2017-08-03 LAB — CBC
HCT: 30.3 % — ABNORMAL LOW (ref 35.0–47.0)
Hemoglobin: 10.5 g/dL — ABNORMAL LOW (ref 12.0–16.0)
MCH: 34.5 pg — AB (ref 26.0–34.0)
MCHC: 34.7 g/dL (ref 32.0–36.0)
MCV: 99.5 fL (ref 80.0–100.0)
PLATELETS: 265 10*3/uL (ref 150–440)
RBC: 3.05 MIL/uL — ABNORMAL LOW (ref 3.80–5.20)
RDW: 12.4 % (ref 11.5–14.5)
WBC: 12.5 10*3/uL — AB (ref 3.6–11.0)

## 2017-08-03 MED ORDER — LEVOFLOXACIN 500 MG PO TABS
750.0000 mg | ORAL_TABLET | ORAL | Status: DC
  Administered 2017-08-03: 750 mg via ORAL
  Filled 2017-08-03: qty 2

## 2017-08-03 MED ORDER — AZITHROMYCIN 250 MG PO TABS
250.0000 mg | ORAL_TABLET | Freq: Every day | ORAL | Status: DC
  Administered 2017-08-03: 250 mg via ORAL
  Filled 2017-08-03: qty 1

## 2017-08-03 MED ORDER — TRAMADOL HCL 50 MG PO TABS: 50.0000 mg | ORAL_TABLET | Freq: Four times a day (QID) | ORAL | 0 refills | Status: DC | PRN

## 2017-08-03 MED ORDER — LEVOFLOXACIN 750 MG PO TABS: 750.0000 mg | ORAL_TABLET | ORAL | 0 refills | Status: AC

## 2017-08-03 NOTE — Progress Notes (Signed)
EMS here to transport pt d/c on 2L oxygen,  left message for [place] with hospice.

## 2017-08-03 NOTE — Progress Notes (Signed)
[place] [facility] [place] NAME: Ana Washington    MR#:  [id number]  DATE OF BIRTH:  [birth year]  SUBJECTIVE:   No issues overnight  REVIEW OF SYSTEMS:    Review of Systems  Constitutional: Negative for fever, chills weight loss HENT: Negative for ear pain, nosebleeds, congestion, facial swelling, rhinorrhea, neck pain, neck stiffness and ear discharge.   Respiratory: Negative for cough, shortness of breath, wheezing  Cardiovascular: Negative for chest pain, palpitations and leg swelling.  Gastrointestinal: Negative for heartburn, abdominal pain, vomiting, diarrhea or consitpation Genitourinary: Negative for dysuria, urgency, frequency, hematuria Musculoskeletal: Negative for back pain or joint pain Neurological: Negative for dizziness, seizures, syncope, focal weakness,  numbness and headaches.  Hematological: Does not bruise/bleed easily.  Psychiatric/Behavioral: Negative for hallucinations, confusion, dysphoric mood    Tolerating Diet: yes      DRUG ALLERGIES:   Allergies  Allergen Reactions  . Codeine Nausea And Vomiting  . Penicillins Rash    Has patient had a PCN reaction causing immediate rash, facial/tongue/throat swelling, SOB or lightheadedness with hypotension: Unknown Has patient had a PCN reaction causing severe rash involving mucus membranes or skin necrosis: Unknown Has patient had a PCN reaction that required hospitalization: Unknown Has patient had a PCN reaction occurring within the last 10 years: No If all of the above answers are "NO", then may proceed with Cephalosporin use.     VITALS:  Blood pressure 126/74, pulse 65, temperature 97.8 F (36.6 C), temperature source Oral, resp. rate 15, height 5\' 5"  (1.651 m), weight 45.4 kg (100 lb), SpO2 100 %.  PHYSICAL EXAMINATION:  Constitutional: Appears frail thin No distress. HENT: Normocephalic. [name] Oropharynx is clear and moist.  Eyes: Conjunctivae and EOM are  normal. PERRLA, no scleral icterus.  Neck: Normal ROM. Neck supple. No JVD. No tracheal deviation. CVS: RRR, S1/S2 +, no murmurs, no gallops, no carotid bruit.  Pulmonary: Normal respiratory effort with coarse breath sounds Abdominal: Soft. BS +,  no distension, tenderness, rebound or guarding.  Musculoskeletal:  No edema and no tenderness.  Neuro: Alert. CN 2-12 grossly intact. No focal deficits. Skin: Skin is warm and dry. No rash noted. Psychiatric: Normal mood and affect.      LABORATORY PANEL:   CBC Recent Labs  Lab 08/03/17 0435  WBC 12.5*  HGB 10.5*  HCT 30.3*  PLT 265   ------------------------------------------------------------------------------------------------------------------  Chemistries  Recent Labs  Lab 07/31/17 0527  NA 137  K 3.6  CL 95*  CO2 34*  GLUCOSE 101*  BUN 15  CREATININE 0.48  CALCIUM 8.9   ------------------------------------------------------------------------------------------------------------------  Cardiac Enzymes No results for input(s): TROPONINI in the last 168 hours. ------------------------------------------------------------------------------------------------------------------  RADIOLOGY:  No results found.   ASSESSMENT AND PLAN:   72 year old female with history of PAF, chronic hypoxic respiratory failure with severe end-stage bronchiectasis who presented after mechanical fall.   1.  Right superior and inferior pubic rami fracture: Patient evaluated by orthopedic surgery.  No surgical intervention required at this time.  Weightbearing as tolerated on the right lower extremity.  2.  Elbow joint effusion: CT scan did not show evidence of notable fracture.  Per orthopedic surgery nonweightbearing on right upper extremity.  Maintains splint.  Continue PT and OT with range of motion of wrist and fingers.  Patient will follow-up with orthopedic surgery in 2 weeks to transition out of splint if repeat x-rays are normal.  3.   Chronic hypoxic respiratory failure with severe chronic bronchiectasis currently  under hospice care: Patient evaluated by Dr. Ashby Dawes.  Chest x-ray alludes to pneumonia. Recommendations are to continue nebulized hypertonic saline followed by percussion vest per day.  Family reports she has this at home. Meropenem was changed to oral Levaquin. Patient will follow-up with Dr. Ashby Dawes in 3 months.  4.  PAF: Patient will continue apixaban and metoprolol for heart rate control  5.  Essential hypertension: Continue losartan and Norvasc     Management plans discussed with the patient and family and they are in agreement.  CODE STATUS: DNR  TOTAL TIME TAKING CARE OF THIS PATIENT: 38 minutes.     POSSIBLE D/C today, DEPENDING ON CLINICAL CONDITION.   Lyndal Alamillo M.D on 08/03/2017 at 8:50 AM  Between 7am to 6pm - Pager - [phone] After 6pm go to [email or web address] - password [email or web address] [facility]  Office  [phone]  CC: Primary care physician; [name], MD  Note: This dictation was prepared with Dragon dictation along with smaller phrase technology. Any transcriptional errors that result from this process are unintentional.

## 2017-08-03 NOTE — Care Management (Signed)
Left message with [facility] for return call regarding discharge.

## 2017-08-03 NOTE — Progress Notes (Signed)
Discharge Note:  Pt given discharge instructions and prescriptions. Pt verbalized understanding. EMS called for transportation.

## 2017-08-03 NOTE — Progress Notes (Signed)
OT Cancellation Note  Patient Details Name: Ana Washington MRN: [id number] DOB: 1945/04/28   Cancelled Treatment:    Reason Eval/Treat Not Completed: Other (comment). Upon attempt to treat, pt actively discharging with EMS transport.   Jeni Salles, MPH, MS, OTR/L ascom [phone] 08/03/17, 12:07 PM

## 2017-08-03 NOTE — Discharge Summary (Addendum)
[facility] [facility] [place] NAME: Ana Washington    MR#:  [id number]  DATE OF BIRTH:  12-21-1945  DATE OF ADMISSION:  07/30/2017 ADMITTING PHYSICIAN: Lance Coon, MD  DATE OF DISCHARGE: 08/03/2017  PRIMARY CARE PHYSICIAN: [name], MD    ADMISSION DIAGNOSIS:  Closed fracture of right elbow, initial encounter [S42.401A] Multiple closed fractures of pelvis, initial encounter ([place]) [S32.82XA]  DISCHARGE DIAGNOSIS:  Principal Problem:   Pelvic fracture ([place]) Active Problems:   Essential hypertension   GERD (gastroesophageal reflux disease)   PAF (paroxysmal atrial fibrillation) (HCC)   CAD (coronary artery disease)   Protein-calorie malnutrition, severe   SECONDARY DIAGNOSIS:   Past Medical History:  Diagnosis Date  . Arthritis   . Atrial fibrillation ([place])   . Bronchiectasis ([place])   . CAD (coronary artery disease)   . CAD (coronary artery disease) 07/30/2017  . Dyspnea   . 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 GERD (gastroesophageal reflux disease)   . Hypertension   . Lung disease   . Myocardial infarction ([place])   . [name] (postoperative nausea and vomiting)     HOSPITAL COURSE:  72 year old female with history of PAF, chronic hypoxic respiratory failure with severe end-stage bronchiectasis who presented after mechanical fall.   1.  Right superior and inferior pubic rami fracture: Patient evaluated by orthopedic surgery.  No surgical intervention required at this time.  Weightbearing as tolerated on the right lower extremity.  2.  Elbow joint effusion: CT scan did not show evidence of notable fracture.  Per orthopedic surgery nonweightbearing on right upper extremity.  Maintains splint.  Continue PT and OT with range of motion of wrist and fingers.  Patient will follow-up with orthopedic surgery in 2 weeks to transition out of splint if repeat x-rays are normal.  3.  Chronic hypoxic respiratory failure with severe chronic  bronchiectasis currently under hospice care: Patient evaluated by Dr. Ashby Dawes.  Chest x-ray alludes to pneumonia. Recommendations are to continue nebulized hypertonic saline followed by percussion vest per day.  Family reports she has this at home. Meropenem was changed to oral Levaquin. Patient will follow-up with Dr. Ashby Dawes in 3 months.  4.  PAF: Patient will continue apixaban and metoprolol for heart rate control  5.  Essential hypertension: Continue losartan and Norvasc  Patient will resume hospice services for her chronic hypoxic respiratory failure.   DISCHARGE CONDITIONS AND DIET:  Stable Regular diet  CONSULTS OBTAINED:  Treatment Team:  Leim Fabry, MD  DRUG ALLERGIES:   Allergies  Allergen Reactions  . Codeine Nausea And Vomiting  . Penicillins Rash    Has patient had a PCN reaction causing immediate rash, facial/tongue/throat swelling, SOB or lightheadedness with hypotension: Unknown Has patient had a PCN reaction causing severe rash involving mucus membranes or skin necrosis: Unknown Has patient had a PCN reaction that required hospitalization: Unknown Has patient had a PCN reaction occurring within the last 10 years: No If all of the above answers are "NO", then may proceed with Cephalosporin use.     DISCHARGE MEDICATIONS:   Allergies as of 08/03/2017      Reactions   Codeine Nausea And Vomiting   Penicillins Rash   Has patient had a PCN reaction causing immediate rash, facial/tongue/throat swelling, SOB or lightheadedness with hypotension: Unknown Has patient had a PCN reaction causing severe rash involving mucus membranes or skin necrosis: Unknown Has patient had a PCN reaction that required hospitalization: Unknown Has patient had a PCN  reaction occurring within the last 10 years: No If all of the above answers are "NO", then may proceed with Cephalosporin use.      Medication List    STOP taking these medications   ciprofloxacin 500 MG  tablet Commonly known as:  CIPRO     TAKE these medications   acetaminophen 500 MG tablet Commonly known as:  TYLENOL Take 1,000 mg by mouth every 4 (four) hours.   ALIGN 4 MG Caps Take 4 mg by mouth daily.   amLODipine 5 MG tablet Commonly known as:  NORVASC Take 5 mg by mouth daily.   apixaban 5 MG Tabs tablet Commonly known as:  ELIQUIS Take 5 mg by mouth daily.   aspirin EC 81 MG tablet Take 81 mg by mouth daily.   azithromycin 250 MG tablet Commonly known as:  ZITHROMAX Take 250 mg by mouth daily. continuous   chlorpheniramine-HYDROcodone 10-8 MG/5ML Suer Commonly known as:  TUSSIONEX Take 6 mLs by mouth at bedtime.   diltiazem 30 MG tablet Commonly known as:  CARDIZEM Take 30 mg by mouth as needed (A-fib). Takes 1 tablet for heart rate over 100 and 2nd tablet 1 hour later if needed   ferrous sulfate 325 (65 FE) MG EC tablet Take 325 mg by mouth daily.   levofloxacin 750 MG tablet Commonly known as:  LEVAQUIN Take 1 tablet (750 mg total) by mouth every other day for 4 days. Start taking on:  08/05/2017   LORazepam 0.5 MG tablet Commonly known as:  ATIVAN Take 0.5-1 mg by mouth every 4 (four) hours. 0.5 mg every 4 hours and 2 tablets at bedtime   losartan 100 MG tablet Commonly known as:  COZAAR Take 100 mg by mouth daily. take 1 tablet by mouth once daily   metoprolol succinate 50 MG 24 hr tablet Commonly known as:  TOPROL-XL Take 50 mg by mouth at bedtime.   multivitamin with minerals Tabs tablet Take 1 tablet by mouth daily. Women's 50+   Oyster Shell/Vitamin D 600-125 MG-UNIT Tabs Take 1 tablet by mouth daily.   predniSONE 10 MG tablet Commonly known as:  DELTASONE Take 1 tablet (10 mg total) by mouth daily with breakfast.   PRILOSEC OTC 20 MG tablet Generic drug:  omeprazole Take 40 mg by mouth daily. TAKES 2 TABLETS   sodium chloride 0.9 % nebulizer solution Take 3 mLs by nebulization 2 (two) times daily as needed for wheezing. Wheezing    traMADol 50 MG tablet Commonly known as:  ULTRAM Take 1 tablet (50 mg total) by mouth every 6 (six) hours as needed for moderate pain.   vitamin C 1000 MG tablet Take 1,000 mg by mouth daily.   VITAMIN D-1000 MAX ST 1000 units tablet Generic drug:  Cholecalciferol Take 1,000 Units by mouth daily.         Today   CHIEF COMPLAINT:  No issues overnight   VITAL SIGNS:  Blood pressure 126/74, pulse 65, temperature 97.8 F (36.6 C), temperature source Oral, resp. rate 15, height 5\' 5"  (1.651 m), weight 45.4 kg (100 lb), SpO2 100 %.   REVIEW OF SYSTEMS:  Review of Systems  Constitutional: Positive for malaise/fatigue. Negative for chills and fever.  HENT: Negative.  Negative for ear discharge, ear pain, hearing loss, nosebleeds and sore throat.   Eyes: Negative.  Negative for blurred vision and pain.  Respiratory: Positive for shortness of breath (baseline). Negative for cough, hemoptysis and wheezing.   Cardiovascular: Negative.  Negative for chest  pain, palpitations and leg swelling.  Gastrointestinal: Negative.  Negative for abdominal pain, blood in stool, diarrhea, nausea and vomiting.  Genitourinary: Negative.  Negative for dysuria.  Musculoskeletal: Positive for falls, joint pain and myalgias. Negative for back pain.  Skin: Negative.   Neurological: Negative for dizziness, tremors, speech change, focal weakness, seizures and headaches.  Endo/Heme/Allergies: Negative.  Does not bruise/bleed easily.  Psychiatric/Behavioral: Negative.  Negative for depression, hallucinations and suicidal ideas.     PHYSICAL EXAMINATION:  GENERAL:  72 y.o.-year-old patient lying in the bed with no acute distress. Thin frailNECK:  Supple, no jugular venous distention. No thyroid enlargement, no tenderness.  LUNGS: coarse breath sounds  CARDIOVASCULAR: S1, S2 normal. No murmurs, rubs, or gallops.  ABDOMEN: Soft, non-tender, non-distended. Bowel sounds present. No organomegaly or mass.   EXTREMITIES: No pedal edema, cyanosis, or clubbing.  PSYCHIATRIC: The patient is alert and oriented x 3.  SKIN: No obvious rash, lesion, or ulcer.   DATA REVIEW:   CBC Recent Labs  Lab 08/03/17 0435  WBC 12.5*  HGB 10.5*  HCT 30.3*  PLT 265    Chemistries  Recent Labs  Lab 07/31/17 0527  NA 137  K 3.6  CL 95*  CO2 34*  GLUCOSE 101*  BUN 15  CREATININE 0.48  CALCIUM 8.9    Cardiac Enzymes No results for input(s): TROPONINI in the last 168 hours.  Microbiology Results  @MICRORSLT48 @  RADIOLOGY:  No results found.    Allergies as of 08/03/2017      Reactions   Codeine Nausea And Vomiting   Penicillins Rash   Has patient had a PCN reaction causing immediate rash, facial/tongue/throat swelling, SOB or lightheadedness with hypotension: Unknown Has patient had a PCN reaction causing severe rash involving mucus membranes or skin necrosis: Unknown Has patient had a PCN reaction that required hospitalization: Unknown Has patient had a PCN reaction occurring within the last 10 years: No If all of the above answers are "NO", then may proceed with Cephalosporin use.      Medication List    STOP taking these medications   ciprofloxacin 500 MG tablet Commonly known as:  CIPRO     TAKE these medications   acetaminophen 500 MG tablet Commonly known as:  TYLENOL Take 1,000 mg by mouth every 4 (four) hours.   ALIGN 4 MG Caps Take 4 mg by mouth daily.   amLODipine 5 MG tablet Commonly known as:  NORVASC Take 5 mg by mouth daily.   apixaban 5 MG Tabs tablet Commonly known as:  ELIQUIS Take 5 mg by mouth daily.   aspirin EC 81 MG tablet Take 81 mg by mouth daily.   azithromycin 250 MG tablet Commonly known as:  ZITHROMAX Take 250 mg by mouth daily. continuous   chlorpheniramine-HYDROcodone 10-8 MG/5ML Suer Commonly known as:  TUSSIONEX Take 6 mLs by mouth at bedtime.   diltiazem 30 MG tablet Commonly known as:  CARDIZEM Take 30 mg by mouth as needed  (A-fib). Takes 1 tablet for heart rate over 100 and 2nd tablet 1 hour later if needed   ferrous sulfate 325 (65 FE) MG EC tablet Take 325 mg by mouth daily.   levofloxacin 750 MG tablet Commonly known as:  LEVAQUIN Take 1 tablet (750 mg total) by mouth every other day for 4 days. Start taking on:  08/05/2017   LORazepam 0.5 MG tablet Commonly known as:  ATIVAN Take 0.5-1 mg by mouth every 4 (four) hours. 0.5 mg every 4 hours and  2 tablets at bedtime   losartan 100 MG tablet Commonly known as:  COZAAR Take 100 mg by mouth daily. take 1 tablet by mouth once daily   metoprolol succinate 50 MG 24 hr tablet Commonly known as:  TOPROL-XL Take 50 mg by mouth at bedtime.   multivitamin with minerals Tabs tablet Take 1 tablet by mouth daily. Women's 50+   Oyster Shell/Vitamin D 600-125 MG-UNIT Tabs Take 1 tablet by mouth daily.   predniSONE 10 MG tablet Commonly known as:  DELTASONE Take 1 tablet (10 mg total) by mouth daily with breakfast.   PRILOSEC OTC 20 MG tablet Generic drug:  omeprazole Take 40 mg by mouth daily. TAKES 2 TABLETS   sodium chloride 0.9 % nebulizer solution Take 3 mLs by nebulization 2 (two) times daily as needed for wheezing. Wheezing   traMADol 50 MG tablet Commonly known as:  ULTRAM Take 1 tablet (50 mg total) by mouth every 6 (six) hours as needed for moderate pain.   vitamin C 1000 MG tablet Take 1,000 mg by mouth daily.   VITAMIN D-1000 MAX ST 1000 units tablet Generic drug:  Cholecalciferol Take 1,000 Units by mouth daily.         Management plans discussed with the patient and she is in agreement. Stable for discharge home  Patient should follow up with hopsice  CODE STATUS:     Code Status Orders  (From admission, onward)        Start     Ordered   07/31/17 1054  Do not attempt resuscitation (DNR)  Continuous    Question Answer Comment  In the event of cardiac or respiratory ARREST Do not call a "code blue"   In the event of  cardiac or respiratory ARREST Do not perform Intubation, CPR, defibrillation or ACLS   In the event of cardiac or respiratory ARREST Use medication by any route, position, wound care, and other measures to relive pain and suffering. May use oxygen, suction and manual treatment of airway obstruction as needed for comfort.      07/31/17 1054    Code Status History    Date Active Date Inactive Code Status Order ID Comments User Context   07/31/2017 0057 07/31/2017 1054 Full Code [id number]  Lance Coon, MD Inpatient   07/05/2016 0931 07/05/2016 1311 Full Code [id number]  Hessie Knows, MD Inpatient    Advance Directive Documentation     Most Recent Value  Type of Advance Directive  Healthcare Power of Attorney  Pre-existing out of facility DNR order (yellow form or pink MOST form)  -  "MOST" Form in Place?  -      TOTAL TIME TAKING CARE OF THIS PATIENT: 38 minutes.    Note: This dictation was prepared with Dragon dictation along with smaller phrase technology. Any transcriptional errors that result from this process are unintentional.  Audy Dauphine M.D on 08/03/2017 at 8:59 AM  Between 7am to 6pm - Pager - [phone] After 6pm go to [email or web address] - password [email or web address] [facility]  Office  [phone]  CC: Primary care physician; [name], MD

## 2017-08-03 NOTE — Consult Note (Signed)
Patient PTA list includes azithromycin 258m daily and cipro 5060mdaily for a hx of MAC. Pharmacy consulted to dose levofloxacin for discharge. Would recommend levofloxacin 75070m 24 hours x 3 doses. Have pt hold cipro while on levofloxacin and resume when levoflxoacin course is complete. Please continue azithro 250m59mily and have pt follow up outpatient as it appears they wanted pt to stop cipro and start inhaled amikacin.  MeliRamond Dialarm.D, BCPS Clinical Pharmacist

## 2017-08-03 NOTE — Care Management (Signed)
Received call back from hospice RN. Report given. Faxed DC summary and PT note.

## 2017-08-06 LAB — CULTURE, BLOOD (ROUTINE X 2)
CULTURE: NO GROWTH
Culture: NO GROWTH
SPECIAL REQUESTS: ADEQUATE

## 2017-08-07 ENCOUNTER — Telehealth: Payer: Self-pay

## 2017-08-07 NOTE — Telephone Encounter (Signed)
EMMI Follow-up: Noted on the report that the patient hadn't read discharge paperwork and uncertain of follow-up appointment(s).  I talked with Ms. Gellatly and she does have the discharge paperwork but hasn't read it yet.  I reminded her that is noted on the After Visit Summary that she needed to make to two follow-up appointments.  One to see Dr. Kary Kos within 6 days and Dr. Leim Fabry within 2 weeks. She said Dr. Kary Kos transferred her back to [facility] and she is waiting to hear from them. She didn't know when she would be able to see Dr. Kary Kos as she in such pain she can't stand to ride.  Will ask for assistance to make a follow-up appointment with Dr. Posey Pronto.  I let her know there would be 2nd automated call and to let us know if she had any concerns at that time.

## 2017-10-06 NOTE — Progress Notes (Addendum)
[facility] Consultation      Assessment and Plan:  Bronchiectasis, history of MAC infection. Now with acute exacerbation. Acute bronchitis, possible pneumonia.  Acute hypoxic respiratory failure -Patient currently has evidence of severe bronchiectasis with chronic mucous expectoration which is excessive. -Continue current medications (oral cipro, azithro), percussion vest with hypertonic saline.  -We will give course of Levaquin, she is asked to stop her chronic Cipro during that time.  Send for chest x-ray today.  We will check sputum culture.  Patient is at high risk of complications and further decline given her severe lung disease.  Hemoptysis. - This is due to severe bronchiectasis, with acute infection, complicated by current use of Eliquis. -We discussed possibly stopping Eliquis, she remains concerned about stroke risk and would like to continue it, therefore we decided that we could keep the Eliquis, but will stop it if she develops hemoptysis, she should remain off of it until the hemoptysis resolves.   Chronic hypoxic respiratory failure. Secondary to above. -Continue chronic oxygen therapy. -Patient has severe, end-stage disease and is at high risk of complications hospitalization and death. - I confirmed with patient today that her current CODE STATUS is DO NOT RESUSCITATE. She is remains enrolled in hospice, and will continue to try to do therapies that will minimize her symptoms.   Hiatal hernia. -Continue omeprazole 20 mg once daily   Meds ordered this encounter  Medications  . levofloxacin (LEVAQUIN) 500 MG tablet    Sig: Take 1 tablet (500 mg total) by mouth daily.    Dispense:  7 tablet    Refill:  0   Orders Placed This Encounter  Procedures  . Expectorated sputum assessment w rflx to resp cult  . DG Chest 2 View     Return in about 6 months (around 04/09/2018).   Date: 10/06/2017  MRN# [id number] Ana Washington [birth year]  Referring  Physician: Dr. [name] is a 72 y.o. old female seen in consultation for chief complaint of:    Chief Complaint  Patient presents with  . Hospitalization Follow-up    cough with green mucus, sob increasing she is on 2.5L 02 pulse dose 02 sats 91%.She is still using nebs and compression vest tid. She has wheezing.  . Recurrent Pneumonia    pt states she has a low grade temp on SAT.    HPI:   She has a diagnosis of severe, advanced bronchiectasis with history of MAC. She has allergies to multiple MAC medications, therefore she is on an empiric regimen, she has been started on flutter vest. She has done better with the flutter vest, but continues to have quite severe baseline dyspnea. She  home health services via hospice  She comes in as an urgent visit today, her husband was sick recently and went to urgent care, she then started getting sick with more cough, increased sputum production.   She is doing hypertonic saline with the percussion vest three times  per day, azithro daily. She is also on cipro 500 mg twice daily.   **Chest x-ray imaging, chest x-ray 09/19/2016>> severe diffuse bronchiectasis changes, which are likely chronic.  **CT chest report; 04/23/2012>> chronic changes of chronic bronchitis, bronchiectasis.  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 Sensitivities list resistance to  clarithromycin, linezolid, moxifloxacin, and amikacin MIC of 64 (resistant). --She was seen by infectious disease [facility] on 10/30/2014, at that time it was recommended that she stop ciprofloxacin, start inhaled amikacin, be enrolled in pulmonary rehabilitation. --Review of records shows pt intolerant of  ethambutol and rifampin.    The patient was initially diagnosed with MAC in the early 51s. She underwent anterior segmentectomy and right middle lobectomy in 1991, resection of lingula and basilar left lower lobe in 1996. She has been intolerant of ethambutol and rifampin. She did well for several years, it has been progressively declining since around 12/2013, with particularly fast decline since summer of 2016.  Social Hx:   Social History   Tobacco Use  . Smoking status: Never Smoker  . Smokeless tobacco: Never Used  Substance Use Topics  . Alcohol use: No  . Drug use: No   Medication:    Current Outpatient Medications:  .  acetaminophen (TYLENOL) 500 MG tablet, Take 1,000 mg by mouth every 4 (four) hours. , Disp: , Rfl:  .  amLODipine (NORVASC) 5 MG tablet, Take 5 mg by mouth daily. , Disp: , Rfl:  .  apixaban (ELIQUIS) 5 MG TABS tablet, Take 5 mg by mouth daily., Disp: , Rfl:  .  Ascorbic Acid (VITAMIN C) 1000 MG tablet, Take 1,000 mg by mouth daily., Disp: , Rfl:  .  aspirin EC 81 MG tablet, Take 81 mg by mouth daily., Disp: , Rfl:  .  azithromycin (ZITHROMAX) 250 MG tablet, Take 250 mg by mouth daily. continuous, Disp: , Rfl:  .  Calcium Carbonate-Vitamin D (OYSTER SHELL/VITAMIN D) 600-125 MG-UNIT TABS, Take 1 tablet by mouth daily., Disp: , Rfl:  .  chlorpheniramine-HYDROcodone (TUSSIONEX) 10-8 MG/5ML SUER, Take 6 mLs by mouth at bedtime., Disp: , Rfl:  .  Cholecalciferol (VITAMIN D-1000 MAX ST) 1000 UNITS tablet, Take 1,000 Units by mouth daily. , Disp: , Rfl:  .  diltiazem (CARDIZEM) 30 MG tablet, Take 30 mg by mouth as needed (A-fib). Takes 1 tablet for heart rate over 100 and 2nd tablet 1 hour later if needed, Disp: , Rfl:  .  ferrous sulfate 325 (65 FE) MG EC tablet, Take 325 mg by mouth daily. , Disp: , Rfl:  .  LORazepam (ATIVAN) 0.5 MG tablet, Take 0.5-1 mg by mouth every 4 (four) hours. 0.5 mg every 4 hours and 2 tablets at bedtime, Disp: , Rfl:  .  losartan (COZAAR)  100 MG tablet, Take 100 mg by mouth daily. take 1 tablet by mouth once daily, Disp: , Rfl:  .  metoprolol succinate (TOPROL-XL) 50 MG 24 hr tablet, Take 50 mg by mouth at bedtime. , Disp: , Rfl:  .  Multiple Vitamin (MULTIVITAMIN WITH MINERALS) TABS tablet, Take 1 tablet by mouth daily. Women's 50+, Disp: , Rfl:  .  omeprazole (PRILOSEC OTC) 20 MG tablet, Take 40 mg by mouth daily. TAKES 2 TABLETS, Disp: , Rfl:  .  predniSONE (DELTASONE) 10 MG tablet, Take 1 tablet (10 mg total) by mouth daily with breakfast., Disp: 30 tablet, Rfl: 6 .  Probiotic Product (ALIGN) 4 MG CAPS, Take 4 mg by mouth daily., Disp: , Rfl:  .  sodium chloride 0.9 % nebulizer solution, Take 3 mLs by nebulization 2 (two) times daily as needed for wheezing. Wheezing , Disp: , Rfl: 0 .  traMADol (ULTRAM) 50 MG tablet, Take 1 tablet (50 mg total) by mouth every 6 (six) hours as needed for moderate pain., Disp: 30  tablet, Rfl: 0   Allergies:  Codeine and Penicillins  Review of Systems  Constitutional: Positive for chills and fever. Negative for weight loss.  HENT: Negative for ear pain, hearing loss and tinnitus.   Eyes: Negative for blurred vision, double vision and photophobia.  Respiratory: Positive for cough and sputum production. Negative for hemoptysis.   Cardiovascular: Negative for chest pain, palpitations and orthopnea.  Gastrointestinal: Negative for heartburn and nausea.  Genitourinary: Negative for dysuria and urgency.  Musculoskeletal: Negative for myalgias.  Skin: Negative for itching and rash.  Neurological: Negative for dizziness and headaches.  Endo/Heme/Allergies: Negative for polydipsia. Does not bruise/bleed easily.    Physical Exam  Constitutional: She is oriented to person, place, and time.  HENT:  Head: Normocephalic and atraumatic.  Eyes: Pupils are equal, round, and reactive to light. EOM are normal.  Neck: Normal range of motion. Neck supple.  Cardiovascular: Normal rate and normal heart  sounds.  Pulmonary/Chest: She is in respiratory distress. She has wheezes. She has rales. She exhibits no tenderness.  Abdominal: She exhibits no distension. There is tenderness.  Musculoskeletal: Normal range of motion. She exhibits no edema or deformity.  Neurological: She is alert and oriented to person, place, and time.  Skin: Skin is warm. She is diaphoretic.     LABORATORY PANEL:   CBC No results for input(s): WBC, HGB, HCT, PLT in the last 168 hours. ------------------------------------------------------------------------------------------------------------------  Chemistries  No results for input(s): NA, K, CL, CO2, GLUCOSE, BUN, CREATININE, CALCIUM, MG, AST, ALT, ALKPHOS, BILITOT in the last 168 hours.  Invalid input(s): GFRCGP ------------------------------------------------------------------------------------------------------------------  Cardiac Enzymes No results for input(s): TROPONINI in the last 168 hours. ------------------------------------------------------------  RADIOLOGY:  No results found.     Thank  you for the consultation and for allowing [facility] to assist in the care of your patient. Our recommendations are noted above.  Please contact us if we can be of further service.  Marda Stalker, M.D., F.C.C.P.  Board Certified in Internal Medicine, Pulmonary Medicine, [place], and Sleep Medicine.  [facility] Office Number: [phone]  10/06/2017

## 2017-10-09 ENCOUNTER — Ambulatory Visit (INDEPENDENT_AMBULATORY_CARE_PROVIDER_SITE_OTHER): Payer: Medicare Other | Admitting: Internal Medicine

## 2017-10-09 ENCOUNTER — Encounter: Payer: Self-pay | Admitting: Internal Medicine

## 2017-10-09 ENCOUNTER — Ambulatory Visit
Admission: RE | Admit: 2017-10-09 | Discharge: 2017-10-09 | Disposition: A | Discharge status: Home / Self Care | Source: Ambulatory Visit | Attending: Internal Medicine | Admitting: Internal Medicine

## 2017-10-09 ENCOUNTER — Other Ambulatory Visit
Admission: RE | Admit: 2017-10-09 | Discharge: 2017-10-09 | Disposition: A | Discharge status: Home / Self Care | Source: Ambulatory Visit | Attending: Internal Medicine | Admitting: Internal Medicine

## 2017-10-09 VITALS — BP 106/68 | HR 87 | Resp 16 | Ht 65.0 in | Wt 111.0 lb

## 2017-10-09 DIAGNOSIS — J209 Acute bronchitis, unspecified: Secondary | ICD-10-CM

## 2017-10-09 DIAGNOSIS — J984 Other disorders of lung: Secondary | ICD-10-CM | POA: Insufficient documentation

## 2017-10-09 DIAGNOSIS — R918 Other nonspecific abnormal finding of lung field: Secondary | ICD-10-CM | POA: Insufficient documentation

## 2017-10-09 DIAGNOSIS — J189 Pneumonia, unspecified organism: Secondary | ICD-10-CM | POA: Diagnosis present

## 2017-10-09 DIAGNOSIS — J440 Chronic obstructive pulmonary disease with acute lower respiratory infection: Secondary | ICD-10-CM

## 2017-10-09 DIAGNOSIS — J470 Bronchiectasis with acute lower respiratory infection: Secondary | ICD-10-CM

## 2017-10-09 DIAGNOSIS — J9611 Chronic respiratory failure with hypoxia: Secondary | ICD-10-CM | POA: Diagnosis not present

## 2017-10-09 LAB — EXPECTORATED SPUTUM ASSESSMENT W GRAM STAIN, RFLX TO RESP C

## 2017-10-09 LAB — EXPECTORATED SPUTUM ASSESSMENT W REFEX TO RESP CULTURE

## 2017-10-09 MED ORDER — LEVOFLOXACIN 500 MG PO TABS: 500.0000 mg | ORAL_TABLET | Freq: Every day | ORAL | 0 refills | Status: DC

## 2017-10-09 NOTE — Patient Instructions (Signed)
Will give course of levaquin, stop cipro until complete.

## 2017-10-11 LAB — CULTURE, RESPIRATORY: CULTURE: NORMAL

## 2017-10-11 LAB — CULTURE, RESPIRATORY W GRAM STAIN

## 2017-11-24 ENCOUNTER — Other Ambulatory Visit: Payer: Self-pay | Admitting: Orthopedic Surgery

## 2017-11-24 DIAGNOSIS — S32000D Wedge compression fracture of unspecified lumbar vertebra, subsequent encounter for fracture with routine healing: Secondary | ICD-10-CM

## 2017-11-25 ENCOUNTER — Ambulatory Visit
Admission: RE | Admit: 2017-11-25 | Discharge: 2017-11-25 | Disposition: A | Discharge status: Home / Self Care | Payer: Medicare Other | Source: Ambulatory Visit | Attending: Orthopedic Surgery | Admitting: Orthopedic Surgery

## 2017-11-25 DIAGNOSIS — S32030A Wedge compression fracture of third lumbar vertebra, initial encounter for closed fracture: Secondary | ICD-10-CM | POA: Insufficient documentation

## 2017-11-25 DIAGNOSIS — S32020A Wedge compression fracture of second lumbar vertebra, initial encounter for closed fracture: Secondary | ICD-10-CM | POA: Diagnosis not present

## 2017-11-25 DIAGNOSIS — M48061 Spinal stenosis, lumbar region without neurogenic claudication: Secondary | ICD-10-CM | POA: Diagnosis not present

## 2017-11-25 DIAGNOSIS — S32000D Wedge compression fracture of unspecified lumbar vertebra, subsequent encounter for fracture with routine healing: Secondary | ICD-10-CM

## 2017-11-27 ENCOUNTER — Encounter
Admission: RE | Admit: 2017-11-27 | Discharge: 2017-11-27 | Disposition: A | Discharge status: Home / Self Care | Payer: Medicare Other | Source: Ambulatory Visit | Attending: Orthopedic Surgery | Admitting: Orthopedic Surgery

## 2017-11-27 ENCOUNTER — Other Ambulatory Visit: Payer: Self-pay

## 2017-11-27 DIAGNOSIS — Z79899 Other long term (current) drug therapy: Secondary | ICD-10-CM | POA: Diagnosis not present

## 2017-11-27 DIAGNOSIS — Z7952 Long term (current) use of systemic steroids: Secondary | ICD-10-CM | POA: Diagnosis not present

## 2017-11-27 DIAGNOSIS — M4856XA Collapsed vertebra, not elsewhere classified, lumbar region, initial encounter for fracture: Secondary | ICD-10-CM

## 2017-11-27 DIAGNOSIS — I2510 Atherosclerotic heart disease of native coronary artery without angina pectoris: Secondary | ICD-10-CM | POA: Diagnosis not present

## 2017-11-27 DIAGNOSIS — Z7901 Long term (current) use of anticoagulants: Secondary | ICD-10-CM | POA: Diagnosis not present

## 2017-11-27 DIAGNOSIS — J449 Chronic obstructive pulmonary disease, unspecified: Secondary | ICD-10-CM | POA: Diagnosis not present

## 2017-11-27 DIAGNOSIS — I428 Other cardiomyopathies: Secondary | ICD-10-CM | POA: Diagnosis not present

## 2017-11-27 DIAGNOSIS — M810 Age-related osteoporosis without current pathological fracture: Secondary | ICD-10-CM | POA: Diagnosis not present

## 2017-11-27 DIAGNOSIS — I10 Essential (primary) hypertension: Secondary | ICD-10-CM

## 2017-11-27 DIAGNOSIS — I4891 Unspecified atrial fibrillation: Secondary | ICD-10-CM

## 2017-11-27 DIAGNOSIS — R9431 Abnormal electrocardiogram [ECG] [EKG]: Secondary | ICD-10-CM

## 2017-11-27 DIAGNOSIS — Z01818 Encounter for other preprocedural examination: Secondary | ICD-10-CM | POA: Insufficient documentation

## 2017-11-27 DIAGNOSIS — I252 Old myocardial infarction: Secondary | ICD-10-CM | POA: Insufficient documentation

## 2017-11-27 DIAGNOSIS — Z9981 Dependence on supplemental oxygen: Secondary | ICD-10-CM | POA: Diagnosis not present

## 2017-11-27 HISTORY — DX: Cardiac arrhythmia, unspecified: I49.9

## 2017-11-27 HISTORY — DX: Family history of other specified conditions: Z84.89

## 2017-11-27 LAB — BASIC METABOLIC PANEL
Anion gap: 9 (ref 5–15)
BUN: 15 mg/dL (ref 8–23)
CO2: 33 mmol/L — ABNORMAL HIGH (ref 22–32)
Calcium: 9.6 mg/dL (ref 8.9–10.3)
Chloride: 92 mmol/L — ABNORMAL LOW (ref 98–111)
Creatinine, Ser: 0.78 mg/dL (ref 0.44–1.00)
GFR calc Af Amer: >60 mL/min (ref >60)
Glucose, Bld: 88 mg/dL (ref 70–99)
Potassium: 3.7 mmol/L (ref 3.5–5.1)
SODIUM: 134 mmol/L — AB (ref 135–145)

## 2017-11-27 LAB — SURGICAL PCR SCREEN
MRSA, PCR: NEGATIVE
Staphylococcus aureus: NEGATIVE

## 2017-11-27 LAB — CBC
HEMATOCRIT: 38.4 % (ref 36.0–46.0)
Hemoglobin: 12.3 g/dL (ref 12.0–15.0)
MCH: 33.1 pg (ref 26.0–34.0)
MCHC: 32 g/dL (ref 30.0–36.0)
MCV: 103.2 fL — AB (ref 80.0–100.0)
NRBC: 0 % (ref 0.0–0.2)
Platelets: 429 10*3/uL — ABNORMAL HIGH (ref 150–400)
RBC: 3.72 MIL/uL — ABNORMAL LOW (ref 3.87–5.11)
RDW: 12.2 % (ref 11.5–15.5)
WBC: 17.3 10*3/uL — ABNORMAL HIGH (ref 4.0–10.5)

## 2017-11-27 MED ORDER — CLINDAMYCIN PHOSPHATE 900 MG/50ML IV SOLN: 900.0000 mg | Freq: Once | INTRAVENOUS | Status: DC

## 2017-11-27 NOTE — Patient Instructions (Addendum)
  Your procedure is scheduled on: Tuesday November 28, 2017 Report to Same Day Surgery 2nd floor [facility] Entrance-take elevator on left to 2nd floor.  Check in with surgery information desk.) To find out your arrival time, call [phone] 1:00-3:00 PM Today  Remember: Instructions that are not followed completely may result in serious medical risk, up to and including death, or upon the discretion of your surgeon and anesthesiologist your surgery may need to be rescheduled.    __x__ 1. Do not eat food (including mints, candies, chewing gum) after midnight the night before your procedure. You may drink clear liquids up to 2 hours before you are scheduled to arrive at the hospital for your procedure.  Do not drink anything within 2 hours of your scheduled arrival to the hospital.  Approved clear liquids:  --Water or Apple juice without pulp  --Clear carbohydrate beverage such as Gatorade or Powerade  --Black Coffee or Clear Tea (No milk, no creamers, do not add anything to the coffee or tea)    __x__ 2. No Alcohol for 24 hours before or after surgery.   __x__ 3. No Smoking or e-cigarettes for 24 hours before surgery.  Do not use any chewable tobacco products for at least 6 hours before surgery.   __x__ 4. Notify your doctor if there is any change in your medical condition (cold, fever, infections).   __x__ 5. On the morning of surgery brush your teeth with toothpaste and water.  You may rinse your mouth with mouthwash if you wish.  Do not swallow any toothpaste or mouthwash.  Please read over the following fact sheets that you were given:   [facility] Preparing for Surgery and/or MRSA Information    __x__ Use CHG Soap or Sage wipes as directed on instruction sheet   Do not wear jewelry, make-up, hairpins, clips or nail polish on the day of surgery.  Do not wear lotions, powders, deodorant, or perfumes.   Do not shave below the face/neck 48 hours prior to surgery.    Do not bring valuables to the hospital.    [facility] is not responsible for any belongings or valuables.               Contacts, dentures or bridgework may not be worn into surgery.  Leave your suitcase in the car. After surgery it may be brought to your room.  For patients admitted to the hospital, discharge time is determined by your treatment team.  For patients discharged on the day of surgery, you will NOT be permitted to drive yourself home.  You must have a responsible adult with you for 24 hours after surgery.  __x__ On the morning of surgery take the following medicines with a small sip of water:   1. Amlodipine (Norvasc)  2. Diltiazem (Cardizem)  3. Lorazepam (Ativan) if needed  4. Metaxalone (Skelaxin)  5. Metoprolol (Toprol)  6. Omeprazole (Prilosec)  7. Tramadol (Ultram) if needed  Do NOT take your Losartan (Cozaar) on the morning of surgery.  __x__ Follow recommendations from Cardiologist, Pulmonologist or PCP regarding stopping blood thinners such as Aspirin, Coumadin, Plavix, Eliquis, Effient, Pradaxa, and Pletal.  __x__ TODAY: Stop Anti-inflammatories such as Advil, Ibuprofen, Motrin, Aleve, Naproxen, Naprosyn, BC/Goodies powders or aspirin products. You may continue to take Tylenol and Celebrex.   __x__ TODAY: Stop supplements until after surgery. You may continue to take Vitamin D, Vitamin B, and multivitamin.

## 2017-11-27 NOTE — Pre-Procedure Instructions (Addendum)
WILL FAX REQUEST FOR PULMONARY AND CARDIAC CLEARANC NOTE TO DR MENZ AS SOON AS EKG DONE. LM FOR TIFFANY. FAXED INFO AND MESSAGED DR MENZ  CALL FROM [place] AT DR MENZ'S STATING HE PLANS TO DO KYPHOPLASTY UNDER LOCAL. NOTIFIED DR [id number] AND ANESTHESIA PLANS TO SPEAK WITH DR [facility]

## 2017-11-28 ENCOUNTER — Encounter: Payer: Self-pay | Admitting: Anesthesiology

## 2017-11-28 ENCOUNTER — Encounter
Admission: RE | Disposition: A | Discharge status: Home / Self Care | Payer: Self-pay | Source: Ambulatory Visit | Attending: Orthopedic Surgery

## 2017-11-28 ENCOUNTER — Ambulatory Visit
Admission: RE | Admit: 2017-11-28 | Discharge: 2017-11-28 | Disposition: A | Discharge status: Home / Self Care | Payer: Medicare Other | Source: Ambulatory Visit | Attending: Orthopedic Surgery | Admitting: Orthopedic Surgery

## 2017-11-28 DIAGNOSIS — Z880 Allergy status to penicillin: Secondary | ICD-10-CM | POA: Diagnosis not present

## 2017-11-28 DIAGNOSIS — K219 Gastro-esophageal reflux disease without esophagitis: Secondary | ICD-10-CM | POA: Diagnosis not present

## 2017-11-28 DIAGNOSIS — M1990 Unspecified osteoarthritis, unspecified site: Secondary | ICD-10-CM | POA: Insufficient documentation

## 2017-11-28 DIAGNOSIS — I2510 Atherosclerotic heart disease of native coronary artery without angina pectoris: Secondary | ICD-10-CM | POA: Insufficient documentation

## 2017-11-28 DIAGNOSIS — Z7982 Long term (current) use of aspirin: Secondary | ICD-10-CM | POA: Insufficient documentation

## 2017-11-28 DIAGNOSIS — Z79899 Other long term (current) drug therapy: Secondary | ICD-10-CM | POA: Insufficient documentation

## 2017-11-28 DIAGNOSIS — Z7901 Long term (current) use of anticoagulants: Secondary | ICD-10-CM | POA: Diagnosis not present

## 2017-11-28 DIAGNOSIS — I480 Paroxysmal atrial fibrillation: Secondary | ICD-10-CM | POA: Diagnosis not present

## 2017-11-28 DIAGNOSIS — Z885 Allergy status to narcotic agent status: Secondary | ICD-10-CM | POA: Diagnosis not present

## 2017-11-28 DIAGNOSIS — I10 Essential (primary) hypertension: Secondary | ICD-10-CM | POA: Insufficient documentation

## 2017-11-28 DIAGNOSIS — I252 Old myocardial infarction: Secondary | ICD-10-CM | POA: Insufficient documentation

## 2017-11-28 DIAGNOSIS — M4856XA Collapsed vertebra, not elsewhere classified, lumbar region, initial encounter for fracture: Secondary | ICD-10-CM | POA: Diagnosis present

## 2017-11-28 SURGERY — KYPHOPLASTY
Anesthesia: Choice

## 2017-11-28 MED ORDER — MORPHINE SULFATE 10 MG/5ML PO SOLN
2.0000 mg | Freq: Once | ORAL | Status: AC
  Administered 2017-11-28: 2 mg via ORAL
  Filled 2017-11-28: qty 2

## 2017-11-28 SURGICAL SUPPLY — 16 items
COVER WAND RF STERILE (DRAPES) ×2 IMPLANT
DERMABOND ADVANCED (GAUZE/BANDAGES/DRESSINGS) ×1
DERMABOND ADVANCED .7 DNX12 (GAUZE/BANDAGES/DRESSINGS) ×1 IMPLANT
DEVICE BIOPSY BONE KYPHX (INSTRUMENTS) ×2 IMPLANT
DRAPE C-ARM XRAY 36X54 (DRAPES) ×2 IMPLANT
DURAPREP 26ML APPLICATOR (WOUND CARE) ×2 IMPLANT
GLOVE SURG SYN 9.0  PF PI (GLOVE) ×1
GLOVE SURG SYN 9.0 PF PI (GLOVE) ×1 IMPLANT
GOWN SRG 2XL LVL 4 RGLN SLV (GOWNS) ×1 IMPLANT
GOWN STRL NON-REIN 2XL LVL4 (GOWNS) ×1
GOWN STRL REUS W/ TWL LRG LVL3 (GOWN DISPOSABLE) ×1 IMPLANT
GOWN STRL REUS W/TWL LRG LVL3 (GOWN DISPOSABLE) ×1
PACK KYPHOPLASTY (MISCELLANEOUS) ×2 IMPLANT
STRAP SAFETY 5IN WIDE (MISCELLANEOUS) ×2 IMPLANT
TRAY KYPHOPAK 15/3 EXPRESS 1ST (MISCELLANEOUS) ×2 IMPLANT
TRAY KYPHOPAK 20/3 EXPRESS 1ST (MISCELLANEOUS) ×2 IMPLANT

## 2017-11-28 NOTE — Progress Notes (Signed)
Called to comment on clearance for kyphoplasty. Pt has severe, end stage lung disease with bronchiectasis. She is at high risk of pulmonary complications. Her medication regimen has been optimized to the extent that can be achieved.   Please feel free to contact me with any questions.   Marda Stalker, M.D., F.C.C.P.  Board Certified in Internal Medicine, Pulmonary Medicine, [place], and Sleep Medicine.  [facility] Office Number: [phone].

## 2017-11-28 NOTE — Progress Notes (Signed)
Patient ate 2 crackers, will not be able to have surgery until close to MN tonight.  Discussed with Dr. Rudene Christians, Dr. Kayleen Memos, patient and family.  Patient will go home tonight and return for surgery tomorrow scheduled  At 3pm..  Patient will arrive tomorrow at 2pm, instructed to have no solid foods after 7am tomorrow morning, may take pain medications as prescribed and may have clear liquids up to 2 hours before surgery.  Patient and family verbalize understanding.  Per order of Dr. Rudene Christians medicated with morphine po prior to discharge to aid in pain control until patient returns to home.

## 2017-11-28 NOTE — Consult Note (Signed)
Cardiology Consultation Note    Patient ID: Ana Washington, MRN: [id number], DOB/AGE: [birth year] 72 y.o. Admit date: 11/28/2017   Date of Consult: 11/28/2017 Primary Physician: [name], MD Primary Cardiologist: none  Chief Complaint: back pain Reason for Consultation: preop clearance Requesting MD: Dr. Rudene Christians  HPI: Ana Washington is a 72 y.o. female with history of paroxysmal atrial fibrillation, Takotsubo syndrome in 2015 evaluated at [facility] with normal coronary arteries EF around 45% with apical ballooning, history of bronchiectasis, history of hypertension and chronic dyspnea who presents for elective kyphoplasty due to severe back pain.  Asked to provide preoperative risk assessment prior to surgery later on this afternoon.  Electrocardiogram done today revealed sinus rhythm with no ischemic changes.  She has dyspnea on exertion.  She is limited activity somewhat due to back pain and shortness of breath however 3 weeks ago she was able to ambulate as per her usual with a walker.  She denies orthopnea or PND.  Denies syncope or dysrhythmia.  Past Medical History:  Diagnosis Date  . Arthritis   . Atrial fibrillation ([place])   . Bronchiectasis ([place])   . CAD (coronary artery disease)   . CAD (coronary artery disease) 07/30/2017  . Dyspnea   . Dysrhythmia    afib  . 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 Family history of adverse reaction to anesthesia    sister [name]  . GERD (gastroesophageal reflux disease)   . Hypertension   . Lung disease   . Myocardial infarction ([place]) 2007   Non-STEMI  . [name] (postoperative nausea and vomiting)       Surgical History:  Past Surgical History:  Procedure Laterality Date  . BACK SURGERY    . CHOLECYSTECTOMY    . FOOT SURGERY    . KYPHOPLASTY N/A 07/05/2016   Procedure: KYPHOPLASTY T - 9;  Surgeon: Hessie Knows, MD;  Location: [facility] ORS;  Service: Orthopedics;  Laterality: N/A;  . LUNG SURGERY  1990 and  1996     Home Meds: Prior to Admission medications   Medication Sig Start Date End Date Taking? Authorizing Provider  acetaminophen (TYLENOL) 500 MG tablet Take 1,000 mg by mouth every 4 (four) hours.     [provider]  amLODipine (NORVASC) 5 MG tablet Take 5 mg by mouth daily.  11/04/13   [provider]  apixaban (ELIQUIS) 5 MG TABS tablet Take 5 mg by mouth daily.    [provider]  Ascorbic Acid (VITAMIN C) 1000 MG tablet Take 1,000 mg by mouth daily.    [provider]  aspirin EC 81 MG tablet Take 81 mg by mouth daily.    [provider]  azithromycin (ZITHROMAX) 250 MG tablet Take 250 mg by mouth daily. continuous 09/09/11   [provider]  Calcium Carbonate-Vitamin D (OYSTER SHELL/VITAMIN D) 600-125 MG-UNIT TABS Take 1 tablet by mouth daily. 11/05/07   [provider]  chlorpheniramine-HYDROcodone (TUSSIONEX) 10-8 MG/5ML SUER Take 6 mLs by mouth at bedtime.    [provider]  Cholecalciferol (VITAMIN D-1000 MAX ST) 1000 UNITS tablet Take 1,000 Units by mouth daily.     [provider]  ciprofloxacin (CIPRO) 500 MG tablet Take 500 mg by mouth 2 (two) times daily. 09/11/17   [provider]  diltiazem (CARDIZEM) 30 MG tablet Take 30 mg by mouth as needed (A-fib). Takes 1 tablet for heart rate over 100 and 2nd tablet 1 hour later if needed  [provider]  ferrous sulfate 325 (65 FE) MG EC tablet Take 325 mg by mouth daily.     [provider]  gabapentin (NEURONTIN) 100 MG capsule Take 300 mg by mouth at bedtime.    [provider]  levofloxacin (LEVAQUIN) 500 MG tablet Take 1 tablet (500 mg total) by mouth daily. Patient not taking: Reported on 11/27/2017 10/09/17   Laverle Hobby, MD  LORazepam (ATIVAN) 0.5 MG tablet Take 0.5-1 mg by mouth every 4 (four) hours. 0.5 mg every 4 hours and 2 tablets at bedtime    [provider]  losartan (COZAAR) 100 MG  tablet Take 100 mg by mouth daily. take 1 tablet by mouth once daily 07/10/13   [provider]  metaxalone (SKELAXIN) 800 MG tablet Take 400 mg by mouth every 4 (four) hours.    [provider]  metoprolol succinate (TOPROL-XL) 50 MG 24 hr tablet Take 50 mg by mouth at bedtime.  10/17/13   [provider]  Multiple Vitamin (MULTIVITAMIN WITH MINERALS) TABS tablet Take 1 tablet by mouth daily. Women's 50+    [provider]  omeprazole (PRILOSEC OTC) 20 MG tablet Take 40 mg by mouth daily. TAKES 2 TABLETS    [provider]  potassium chloride (K-DUR) 10 MEQ tablet Take 10 mEq by mouth daily.    [provider]  predniSONE (DELTASONE) 10 MG tablet Take 1 tablet (10 mg total) by mouth daily with breakfast. 06/06/17   Laverle Hobby, MD  Probiotic Product (ALIGN) 4 MG CAPS Take 4 mg by mouth daily.    [provider]  sodium chloride 0.9 % nebulizer solution Take 3 mLs by nebulization 2 (two) times daily as needed for wheezing. Wheezing  06/07/16   [provider]  traMADol (ULTRAM) 50 MG tablet Take 1 tablet (50 mg total) by mouth every 6 (six) hours as needed for moderate pain. 08/03/17   Bettey Costa, MD    Inpatient Medications:   . clindamycin (CLEOCIN) IV      Allergies:  Allergies  Allergen Reactions  . Codeine Nausea And Vomiting  . Penicillins Rash    Has patient had a PCN reaction causing immediate rash, facial/tongue/throat swelling, SOB or lightheadedness with hypotension: Unknown Has patient had a PCN reaction causing severe rash involving mucus membranes or skin necrosis: Unknown Has patient had a PCN reaction that required hospitalization: Unknown Has patient had a PCN reaction occurring within the last 10 years: No If all of the above answers are "NO", then may proceed with Cephalosporin use.     Social History   Socioeconomic History  . Marital status: Married    Spouse name: Not on file  . Number  of children: Not on file  . Years of education: Not on file  . Highest education level: Not on file  Occupational History  . Not on file  Social Needs  . Financial resource strain: Not on file  . Food insecurity:    Worry: Not on file    Inability: Not on file  . Transportation needs:    Medical: Not on file    Non-medical: Not on file  Tobacco Use  . Smoking status: Never Smoker  . Smokeless tobacco: Never Used  Substance and Sexual Activity  . Alcohol use: No  . Drug use: No  . Sexual activity: Not on file  Lifestyle  . Physical activity:    Days per week: Not on file    Minutes per session:  Not on file  . Stress: Not on file  Relationships  . Social connections:    Talks on phone: Not on file    Gets together: Not on file    Attends religious service: Not on file    Active member of club or organization: Not on file    Attends meetings of clubs or organizations: Not on file    Relationship status: Not on file  . Intimate partner violence:    Fear of current or ex partner: Not on file    Emotionally abused: Not on file    Physically abused: Not on file    Forced sexual activity: Not on file  Other Topics Concern  . Not on file  Social History Narrative  . Not on file     Family History  Problem Relation Age of Onset  . Hypertension Mother   . Hypertension Father      Review of Systems: A 12-system review of systems was performed and is negative except as noted in the HPI.  Labs: No results for input(s): CKTOTAL, CKMB, TROPONINI in the last 72 hours. Lab Results  Component Value Date   WBC 17.3 (H) 11/27/2017   HGB 12.3 11/27/2017   HCT 38.4 11/27/2017   MCV 103.2 (H) 11/27/2017   PLT 429 (H) 11/27/2017    Recent Labs  Lab 11/27/17 1200  NA 134*  K 3.7  CL 92*  CO2 33*  BUN 15  CREATININE 0.78  CALCIUM 9.6  GLUCOSE 88   No results found for: CHOL, HDL, LDLCALC, TRIG No results found for: DDIMER  Radiology/Studies:  Mr Lumbar Spine Wo  Contrast  Result Date: 11/25/2017 CLINICAL DATA:  Low back pain for 1 week. EXAM: MRI LUMBAR SPINE WITHOUT CONTRAST TECHNIQUE: Multiplanar, multisequence MR imaging of the lumbar spine was performed. No intravenous contrast was administered. COMPARISON:  07/25/2016 FINDINGS: Segmentation: There are five lumbar type vertebral bodies. The last full intervertebral disc space is labeled L5-S1. This correlates with the prior MRI examination Alignment:  Normal Vertebrae: There are new/acute superior endplate fractures of L2 and L3. There are remote compression fractures of L4 and L5. L1 hemangioma noted. Conus medullaris and cauda equina: Conus extends to the top of L1 level. Conus and cauda equina appear normal. Paraspinal and other soft tissues: No significant findings. Stable left renal cyst. Disc levels: T12-L1: No significant findings. L1-2: Mild bulging annulus and slight retropulsion involving the posterosuperior aspect of the L2 vertebral body. There is mild flattening of the ventral thecal sac but no significant spinal stenosis. No foraminal stenosis. L2-3: Broad-based bulging annulus and very mild retropulsion involving the posterosuperior aspect of L3. There is flattening of the ventral thecal sac and mild bilateral lateral recess encroachment. No foraminal stenosis. L3-4: Remote compression fracture of L4 with slight retropulsion. Mild flattening of the ventral thecal sac is stable. No significant spinal or foraminal stenosis. L4-5: Diffuse bulging degenerated annulus with mass effect on the ventral thecal sac. This in conjunction with bilateral facet disease creates mild spinal and bilateral lateral recess stenosis. No foraminal stenosis. L5-S1: Prior surgery with left laminectomy. Disc disease and facet disease but no disc protrusions, spinal or foraminal stenosis. IMPRESSION: 1. Acute superior endplate fracture of L2 and L3. Minimal retropulsion but no significant canal compromise. 2. Remote L4 and L5  fractures. 3. Stable mild spinal and bilateral lateral recess stenosis at L3-4 and L4-5. Electronically Signed   By: Marijo Sanes M.D.   On: 11/25/2017 12:37  Wt Readings from Last 3 Encounters:  11/27/17 51 kg  10/09/17 50.3 kg  07/30/17 45.4 kg    EKG: Normal sinus rhythm with no ischemic changes.  Physical Exam:  There were no vitals taken for this visit. There is no height or weight on file to calculate BMI. General: Well developed, well nourished, in no acute distress. Head: Normocephalic, atraumatic, sclera non-icteric, no xanthomas, nares are without discharge.  Neck: Negative for carotid bruits. JVD not elevated. Lungs: Creased breath sounds bilaterally. Heart: RRR with S1 S2. No murmurs, rubs, or gallops appreciated. Abdomen: Soft, non-tender, non-distended with normoactive bowel sounds. No hepatomegaly. No rebound/guarding. No obvious abdominal masses. Msk:  Strength and tone appear normal for age. Extremities: No clubbing or cyanosis. No edema.  Distal pedal pulses are 2+ and equal bilaterally. Neuro: Alert and oriented X 3. No facial asymmetry. No focal deficit. Moves all extremities spontaneously. Psych:  Responds to questions appropriately with a normal affect.     Assessment and Plan  72 year old female with history of bronchiectasis, history of Takotsubo syndrome with normal coronary arteries by cath in 2015, history of paroxysmal atrial fibrillation, history of severe back pain who presents for elective kyphoplasty.  Long discussion with the patient and family members.  At this point she remains in sinus rhythm.  She appears optimized as best as possible from a cardiac standpoint for surgery.  Would proceed with surgery as planned as she is at moderate but optimize risk.  We will need input from pulmonary as she has significant bronchiectasis.  Would proceed without further cardiac work-up.  We will follow-up as an outpatient if needed.  Signed, Teodoro Spray  MD 11/28/2017, 3:53 PM

## 2017-11-29 ENCOUNTER — Ambulatory Visit
Admission: RE | Admit: 2017-11-29 | Discharge: 2017-11-29 | Disposition: A | Discharge status: Home / Self Care | Payer: Medicare Other | Source: Other Acute Inpatient Hospital | Attending: Orthopedic Surgery | Admitting: Orthopedic Surgery

## 2017-11-29 ENCOUNTER — Ambulatory Visit: Payer: Medicare Other

## 2017-11-29 ENCOUNTER — Other Ambulatory Visit: Payer: Self-pay

## 2017-11-29 ENCOUNTER — Ambulatory Visit: Payer: Medicare Other | Admitting: Anesthesiology

## 2017-11-29 ENCOUNTER — Encounter
Admission: RE | Disposition: A | Discharge status: Home / Self Care | Payer: Self-pay | Source: Other Acute Inpatient Hospital | Attending: Orthopedic Surgery

## 2017-11-29 DIAGNOSIS — Z7901 Long term (current) use of anticoagulants: Secondary | ICD-10-CM | POA: Insufficient documentation

## 2017-11-29 DIAGNOSIS — Z79899 Other long term (current) drug therapy: Secondary | ICD-10-CM | POA: Insufficient documentation

## 2017-11-29 DIAGNOSIS — I428 Other cardiomyopathies: Secondary | ICD-10-CM | POA: Insufficient documentation

## 2017-11-29 DIAGNOSIS — I10 Essential (primary) hypertension: Secondary | ICD-10-CM | POA: Insufficient documentation

## 2017-11-29 DIAGNOSIS — J449 Chronic obstructive pulmonary disease, unspecified: Secondary | ICD-10-CM | POA: Insufficient documentation

## 2017-11-29 DIAGNOSIS — M810 Age-related osteoporosis without current pathological fracture: Secondary | ICD-10-CM | POA: Insufficient documentation

## 2017-11-29 DIAGNOSIS — I4891 Unspecified atrial fibrillation: Secondary | ICD-10-CM | POA: Insufficient documentation

## 2017-11-29 DIAGNOSIS — M4856XA Collapsed vertebra, not elsewhere classified, lumbar region, initial encounter for fracture: Secondary | ICD-10-CM | POA: Diagnosis not present

## 2017-11-29 DIAGNOSIS — Z419 Encounter for procedure for purposes other than remedying health state, unspecified: Secondary | ICD-10-CM

## 2017-11-29 DIAGNOSIS — Z9981 Dependence on supplemental oxygen: Secondary | ICD-10-CM | POA: Insufficient documentation

## 2017-11-29 DIAGNOSIS — I252 Old myocardial infarction: Secondary | ICD-10-CM | POA: Insufficient documentation

## 2017-11-29 DIAGNOSIS — Z7952 Long term (current) use of systemic steroids: Secondary | ICD-10-CM | POA: Insufficient documentation

## 2017-11-29 DIAGNOSIS — I2510 Atherosclerotic heart disease of native coronary artery without angina pectoris: Secondary | ICD-10-CM | POA: Insufficient documentation

## 2017-11-29 HISTORY — PX: KYPHOPLASTY: SHX5884

## 2017-11-29 SURGERY — KYPHOPLASTY
Anesthesia: General

## 2017-11-29 MED ORDER — FENTANYL CITRATE (PF) 100 MCG/2ML IJ SOLN: 25.0000 ug | INTRAMUSCULAR | Status: DC | PRN

## 2017-11-29 MED ORDER — LIDOCAINE HCL (PF) 2 % IJ SOLN
INTRAMUSCULAR | Status: AC
  Filled 2017-11-29: qty 10

## 2017-11-29 MED ORDER — ONDANSETRON HCL 4 MG/2ML IJ SOLN: 4.0000 mg | Freq: Four times a day (QID) | INTRAMUSCULAR | Status: DC | PRN

## 2017-11-29 MED ORDER — PROPOFOL 10 MG/ML IV BOLUS
INTRAVENOUS | Status: DC | PRN
  Administered 2017-11-29 (×5): 10 mg via INTRAVENOUS

## 2017-11-29 MED ORDER — CLINDAMYCIN PHOSPHATE 900 MG/50ML IV SOLN
900.0000 mg | Freq: Once | INTRAVENOUS | Status: AC
  Administered 2017-11-29: 900 mg via INTRAVENOUS

## 2017-11-29 MED ORDER — DEXAMETHASONE SODIUM PHOSPHATE 10 MG/ML IJ SOLN
INTRAMUSCULAR | Status: AC
  Filled 2017-11-29: qty 1

## 2017-11-29 MED ORDER — FENTANYL CITRATE (PF) 100 MCG/2ML IJ SOLN
INTRAMUSCULAR | Status: DC | PRN
  Administered 2017-11-29 (×2): 25 ug via INTRAVENOUS

## 2017-11-29 MED ORDER — LACTATED RINGERS IV SOLN
INTRAVENOUS | Status: DC
  Administered 2017-11-29 (×2): via INTRAVENOUS

## 2017-11-29 MED ORDER — METOCLOPRAMIDE HCL 10 MG PO TABS: 5.0000 mg | ORAL_TABLET | Freq: Three times a day (TID) | ORAL | Status: DC | PRN

## 2017-11-29 MED ORDER — LIDOCAINE HCL (PF) 1 % IJ SOLN
INTRAMUSCULAR | Status: DC | PRN
  Administered 2017-11-29: 30 mL

## 2017-11-29 MED ORDER — LIDOCAINE HCL (CARDIAC) PF 100 MG/5ML IV SOSY
PREFILLED_SYRINGE | INTRAVENOUS | Status: DC | PRN
  Administered 2017-11-29: 20 mg via INTRAVENOUS

## 2017-11-29 MED ORDER — FENTANYL CITRATE (PF) 100 MCG/2ML IJ SOLN
INTRAMUSCULAR | Status: AC
  Filled 2017-11-29: qty 2

## 2017-11-29 MED ORDER — DEXAMETHASONE SODIUM PHOSPHATE 10 MG/ML IJ SOLN
INTRAMUSCULAR | Status: DC | PRN
  Administered 2017-11-29: 6 mg via INTRAVENOUS

## 2017-11-29 MED ORDER — MIDAZOLAM HCL 2 MG/2ML IJ SOLN
INTRAMUSCULAR | Status: DC | PRN
  Administered 2017-11-29: .5 mg via INTRAVENOUS

## 2017-11-29 MED ORDER — CLINDAMYCIN PHOSPHATE 900 MG/50ML IV SOLN
INTRAVENOUS | Status: AC
  Filled 2017-11-29: qty 50

## 2017-11-29 MED ORDER — ONDANSETRON HCL 4 MG/2ML IJ SOLN
INTRAMUSCULAR | Status: AC
  Filled 2017-11-29: qty 2

## 2017-11-29 MED ORDER — BUPIVACAINE-EPINEPHRINE (PF) 0.5% -1:200000 IJ SOLN
INTRAMUSCULAR | Status: DC | PRN
  Administered 2017-11-29: 20 mL

## 2017-11-29 MED ORDER — MIDAZOLAM HCL 2 MG/2ML IJ SOLN
INTRAMUSCULAR | Status: AC
  Filled 2017-11-29: qty 2

## 2017-11-29 MED ORDER — METOCLOPRAMIDE HCL 5 MG/ML IJ SOLN: 5.0000 mg | Freq: Three times a day (TID) | INTRAMUSCULAR | Status: DC | PRN

## 2017-11-29 MED ORDER — KETOROLAC TROMETHAMINE 30 MG/ML IJ SOLN
INTRAMUSCULAR | Status: AC
  Filled 2017-11-29: qty 1

## 2017-11-29 MED ORDER — KETOROLAC TROMETHAMINE 30 MG/ML IJ SOLN
INTRAMUSCULAR | Status: DC | PRN
  Administered 2017-11-29: 30 mg via INTRAVENOUS

## 2017-11-29 MED ORDER — ONDANSETRON HCL 4 MG/2ML IJ SOLN
INTRAMUSCULAR | Status: DC | PRN
  Administered 2017-11-29: 4 mg via INTRAVENOUS

## 2017-11-29 MED ORDER — SODIUM CHLORIDE 0.9 % IV SOLN: INTRAVENOUS | Status: DC

## 2017-11-29 MED ORDER — ONDANSETRON HCL 4 MG PO TABS: 4.0000 mg | ORAL_TABLET | Freq: Four times a day (QID) | ORAL | Status: DC | PRN

## 2017-11-29 MED ORDER — PROPOFOL 10 MG/ML IV BOLUS
INTRAVENOUS | Status: AC
  Filled 2017-11-29: qty 20

## 2017-11-29 MED ORDER — MORPHINE SULFATE 10 MG/5ML PO SOLN: 2.5000 mg | ORAL | Status: DC | PRN

## 2017-11-29 SURGICAL SUPPLY — 20 items
CEMENT KYPHON CX01A KIT/MIXER (Cement) ×2 IMPLANT
COVER WAND RF STERILE (DRAPES) ×2 IMPLANT
DERMABOND ADVANCED (GAUZE/BANDAGES/DRESSINGS) ×1
DERMABOND ADVANCED .7 DNX12 (GAUZE/BANDAGES/DRESSINGS) ×1 IMPLANT
DEVICE BIOPSY BONE KYPHX (INSTRUMENTS) ×1 IMPLANT
DRAPE C-ARM XRAY 36X54 (DRAPES) ×2 IMPLANT
DURAPREP 26ML APPLICATOR (WOUND CARE) ×2 IMPLANT
GLOVE BIOGEL PI IND STRL 7.5 (GLOVE) IMPLANT
GLOVE BIOGEL PI INDICATOR 7.5 (GLOVE) ×3
GLOVE SURG SYN 9.0  PF PI (GLOVE) ×1
GLOVE SURG SYN 9.0 PF PI (GLOVE) ×1 IMPLANT
GOWN SRG 2XL LVL 4 RGLN SLV (GOWNS) ×1 IMPLANT
GOWN STRL NON-REIN 2XL LVL4 (GOWNS) ×1
GOWN STRL REUS W/ TWL LRG LVL3 (GOWN DISPOSABLE) ×1 IMPLANT
GOWN STRL REUS W/TWL LRG LVL3 (GOWN DISPOSABLE) ×1
PACK KYPHOPLASTY (MISCELLANEOUS) ×2 IMPLANT
STRAP SAFETY 5IN WIDE (MISCELLANEOUS) ×2 IMPLANT
TRAY KYPHOPAK 15/2 EXPRESS (KITS) ×1 IMPLANT
TRAY KYPHOPAK 15/3 EXPRESS 1ST (MISCELLANEOUS) ×1 IMPLANT
TRAY KYPHOPAK 20/3 EXPRESS 1ST (MISCELLANEOUS) ×1 IMPLANT

## 2017-11-29 NOTE — Anesthesia Postprocedure Evaluation (Signed)
Anesthesia Post Note  Patient: Ana Washington  Procedure(s) Performed: Iona Hansen (N/A )  Patient location during evaluation: PACU Anesthesia Type: General Level of consciousness: awake and alert Pain management: pain level controlled Vital Signs Assessment: post-procedure vital signs reviewed and stable Respiratory status: spontaneous breathing and respiratory function stable Cardiovascular status: stable Anesthetic complications: no     Last Vitals:  Vitals:   11/29/17 1625 11/29/17 1630  BP:  (!) 157/86  Pulse: 77 80  Resp: (!) 25 18  Temp:  36.7 C  SpO2: 100% 97%    Last Pain:  Vitals:   11/29/17 1630  TempSrc:   PainSc: 0-No pain                 KEPHART,WILLIAM K

## 2017-11-29 NOTE — Discharge Instructions (Addendum)
Remove Band-Aids on Friday then okay to shower.  Take it easy today and tomorrow then resume more normal activities on Friday.  Call office if having problems.   AMBULATORY SURGERY  DISCHARGE INSTRUCTIONS   1) The drugs that you were given will stay in your system until tomorrow so for the next 24 hours you should not:  A) Drive an automobile B) Make any legal decisions C) Drink any alcoholic beverage   2) You may resume regular meals tomorrow.  Today it is better to start with liquids and gradually work up to solid foods.  You may eat anything you prefer, but it is better to start with liquids, then soup and crackers, and gradually work up to solid foods.   3) Please notify your doctor immediately if you have any unusual bleeding, trouble breathing, redness and pain at the surgery site, drainage, fever, or pain not relieved by medication.    4) Additional Instructions:        Please contact your physician with any problems or Same Day Surgery at [phone], Monday through Friday 6 am to 4 pm, or [place] at [facility] number at [phone].

## 2017-11-29 NOTE — Op Note (Signed)
11/29/2017  3:46 PM  PATIENT:  Ana Washington  72 y.o. female  PRE-OPERATIVE DIAGNOSIS:  l2 and l3 compression fracture  POST-OPERATIVE DIAGNOSIS:  l2 and l3 compression fracture  PROCEDURE:  Procedure(s) with comments: KYPHOPLASTY-L2,L3 (N/A) - L2 and L3  SURGEON: Laurene Footman, MD  ASSISTANTS: None  ANESTHESIA:   local and MAC  EBL:  Total I/O In: 500 [I.V.:100; IV Piggyback:400] Out: -   BLOOD ADMINISTERED:none  DRAINS: none   LOCAL MEDICATIONS USED:  MARCAINE    and XYLOCAINE   SPECIMEN:  No Specimen  DISPOSITION OF SPECIMEN:  N/A  COUNTS:  YES  TOURNIQUET:  * No tourniquets in log *  IMPLANTS: Bone cement  DICTATION: .Dragon Dictation patient was brought to the operating room and after adequate sedation was given she was placed prone.  C arm was brought in in good visualization in both AP and lateral projection of the affected levels coming both from the sacrum up and from T12 down to confirm the levels.  After appropriate patient identification and timeout procedures local anesthetic was infiltrated the subcutaneous tissue 10 cc on the right side.  The back was then prepped and draped you sterile fashion and repeat timeout procedure carried out.  Spinal needle was used to get local anesthetic down to the pedicle on the right at L2 and L3.  A 50-50 mix of 1% Xylocaine half percent Sensorcaine with epinephrine 20 cc at each level was given.  Small incisions were then made and trocar was advanced using the express kit to minimize postop bleeding.  The vertebral body was entered staying lateral to the pedicle until entering the vertebral body with frequent views on AP and lateral imaging.  Once both trochars were placed drilling was carried out placement of balloons and inflation when the cement was appropriate consistency approximately 3 cc into L2 and 5 into L3 gave very good fill from top to bottom left to right both levels.  When the cement had set trochars removed and  there is no significant extravasation.  The wounds were closed with Dermabond followed by Band-Aids  PLAN OF CARE: Discharge to home after PACU  PATIENT DISPOSITION:  PACU - hemodynamically stable.

## 2017-11-29 NOTE — Transfer of Care (Addendum)
Immediate Anesthesia Transfer of Care Note  Patient: Ana Washington  Procedure(s) Performed: Iona Hansen (N/A )  Patient Location: PACU  Anesthesia Type:MAC  Level of Consciousness: sedated  Airway & Oxygen Therapy: Patient Spontanous Breathing and Patient connected to nasal cannula oxygen  Post-op Assessment: Report given to RN and Post -op Vital signs reviewed and stable  Post vital signs: Reviewed and stable  Last Vitals:  Vitals Value Taken Time  BP 170/92 11/29/2017  3:44 PM  Temp    Pulse 91 11/29/2017  3:44 PM  Resp 23 11/29/2017  3:44 PM  SpO2 100 % 11/29/2017  3:44 PM  Vitals shown include unvalidated device data.  Last Pain:  Vitals:   11/29/17 1351  TempSrc: Temporal  PainSc:          Complications: No apparent anesthesia complications

## 2017-11-29 NOTE — H&P (Signed)
Reviewed paper H+P, will be scanned into chart. No changes noted. MRI showed L2 and L3 compression fractures

## 2017-11-29 NOTE — Anesthesia Post-op Follow-up Note (Signed)
Anesthesia QCDR form completed.        

## 2017-11-29 NOTE — Anesthesia Procedure Notes (Signed)
Procedure Name: MAC Date/Time: 11/29/2017 3:05 PM Performed by: Allean Found, CRNA Pre-anesthesia Checklist: Patient identified, Emergency Drugs available, Suction available, Patient being monitored and Timeout performed Patient Re-evaluated:Patient Re-evaluated prior to induction Oxygen Delivery Method: Nasal cannula Placement Confirmation: positive ETCO2

## 2017-11-29 NOTE — Anesthesia Preprocedure Evaluation (Addendum)
Anesthesia Evaluation  Patient identified by MRN, date of birth, ID band Patient awake    Reviewed: Allergy & Precautions, H&P , NPO status , Patient's Chart, lab work & pertinent test results  History of Anesthesia Complications (+) [name] and history of anesthetic complications  Airway Mallampati: III  TM Distance: >3 FB     Dental  (+) Upper Dentures, Missing   Pulmonary shortness of breath, at rest and Long-Term Oxygen Therapy, COPD,  oxygen dependent,  Bronchiectasis, wears 2L O2 at baseline ATC  Shallow breathing at baseline          Cardiovascular Exercise Tolerance: Poor hypertension, + CAD and + Past MI  + dysrhythmias Atrial Fibrillation  Rate:Normal  NORMAL LEFT VENTRICULAR SYSTOLIC FUNCTION NORMAL RIGHT VENTRICULAR SYSTOLIC FUNCTION VALVULAR REGURGITATION: TRIVIAL AR, TRIVIAL MR, TRIVIAL PR, TRIVIAL TR NO VALVULAR STENOSIS   Neuro/Psych negative neurological ROS  negative psych ROS   GI/Hepatic Neg liver ROS, GERD  ,  Endo/Other  negative endocrine ROS  Renal/GU negative Renal ROS  negative genitourinary   Musculoskeletal  (+) Arthritis ,   Abdominal   Peds  Hematology negative hematology ROS (+)   Anesthesia Other Findings Past Medical History: No date: Arthritis No date: Atrial fibrillation (HCC) No date: Bronchiectasis (HCC) No date: CAD (coronary artery disease) 07/30/2017: CAD (coronary artery disease) No date: Dyspnea No date: Dysrhythmia     Comment:  afib 10/03/2013: Essential hypertension, malignant No date: Family history of adverse reaction to anesthesia     Comment:  sister [name] No date: GERD (gastroesophageal reflux disease) No date: Hypertension No date: Lung disease 2007: Myocardial infarction ([place])     Comment:  Non-STEMI No date: [name] (postoperative nausea and vomiting)  Past Surgical History: No date: BACK SURGERY No date: CHOLECYSTECTOMY No date: FOOT  SURGERY 07/05/2016: KYPHOPLASTY; N/A     Comment:  Procedure: KYPHOPLASTY T - 9;  Surgeon: Hessie Knows,               MD;  Location: [facility] ORS;  Service: Orthopedics;                Laterality: N/A; 1990 and 1996: LUNG SURGERY  BMI    Body Mass Index:  18.70 kg/m      Reproductive/Obstetrics negative OB ROS                           Anesthesia Physical Anesthesia Plan  ASA: IV  Anesthesia Plan: General   Post-op Pain Management:    Induction:   [name] Risk Score and Plan: Propofol infusion and TIVA  Airway Management Planned: Natural Airway  Additional Equipment:   Intra-op Plan:   Post-operative Plan:   Informed Consent: I have reviewed the patients History and Physical, chart, labs and discussed the procedure including the risks, benefits and alternatives for the proposed anesthesia with the patient or authorized representative who has indicated his/her understanding and acceptance.   Dental Advisory Given  Plan Discussed with: Anesthesiologist, CRNA and Surgeon  Anesthesia Plan Comments:        Anesthesia Quick Evaluation

## 2017-11-29 NOTE — OR Nursing (Signed)
Discharge instructions discussed with pt and daughter. Both voice understanding. 

## 2017-11-30 ENCOUNTER — Encounter: Payer: Self-pay | Admitting: Orthopedic Surgery

## 2017-12-14 ENCOUNTER — Other Ambulatory Visit: Payer: Self-pay | Admitting: Orthopedic Surgery

## 2017-12-14 DIAGNOSIS — S32050A Wedge compression fracture of fifth lumbar vertebra, initial encounter for closed fracture: Secondary | ICD-10-CM

## 2017-12-15 ENCOUNTER — Ambulatory Visit
Admission: RE | Admit: 2017-12-15 | Discharge: 2017-12-15 | Disposition: A | Discharge status: Home / Self Care | Payer: Medicare Other | Source: Ambulatory Visit | Attending: Orthopedic Surgery | Admitting: Orthopedic Surgery

## 2017-12-15 DIAGNOSIS — M47816 Spondylosis without myelopathy or radiculopathy, lumbar region: Secondary | ICD-10-CM | POA: Diagnosis not present

## 2017-12-15 DIAGNOSIS — M48061 Spinal stenosis, lumbar region without neurogenic claudication: Secondary | ICD-10-CM | POA: Insufficient documentation

## 2017-12-15 DIAGNOSIS — S32050A Wedge compression fracture of fifth lumbar vertebra, initial encounter for closed fracture: Secondary | ICD-10-CM | POA: Insufficient documentation

## 2017-12-15 NOTE — Pre-Procedure Instructions (Signed)
Ana Washington at Dr Rudene Christians office notified that the plan for surgery on the history and physical does not match the consent order.

## 2017-12-17 MED ORDER — CEFAZOLIN SODIUM-DEXTROSE 1-4 GM/50ML-% IV SOLN
1.0000 g | Freq: Once | INTRAVENOUS | Status: AC
  Administered 2017-12-18: 1 g via INTRAVENOUS

## 2017-12-18 ENCOUNTER — Ambulatory Visit: Payer: Medicare Other | Admitting: Anesthesiology

## 2017-12-18 ENCOUNTER — Encounter
Admission: RE | Disposition: A | Discharge status: Home / Self Care | Payer: Self-pay | Source: Ambulatory Visit | Attending: Orthopedic Surgery

## 2017-12-18 ENCOUNTER — Ambulatory Visit
Admission: RE | Admit: 2017-12-18 | Discharge: 2017-12-18 | Disposition: A | Discharge status: Home / Self Care | Payer: Medicare Other | Source: Ambulatory Visit | Attending: Orthopedic Surgery | Admitting: Orthopedic Surgery

## 2017-12-18 ENCOUNTER — Encounter: Payer: Self-pay | Admitting: *Deleted

## 2017-12-18 ENCOUNTER — Ambulatory Visit: Payer: Medicare Other

## 2017-12-18 DIAGNOSIS — Z79899 Other long term (current) drug therapy: Secondary | ICD-10-CM | POA: Insufficient documentation

## 2017-12-18 DIAGNOSIS — I10 Essential (primary) hypertension: Secondary | ICD-10-CM | POA: Diagnosis not present

## 2017-12-18 DIAGNOSIS — I4891 Unspecified atrial fibrillation: Secondary | ICD-10-CM | POA: Insufficient documentation

## 2017-12-18 DIAGNOSIS — Z7901 Long term (current) use of anticoagulants: Secondary | ICD-10-CM | POA: Insufficient documentation

## 2017-12-18 DIAGNOSIS — K219 Gastro-esophageal reflux disease without esophagitis: Secondary | ICD-10-CM | POA: Diagnosis not present

## 2017-12-18 DIAGNOSIS — I252 Old myocardial infarction: Secondary | ICD-10-CM | POA: Insufficient documentation

## 2017-12-18 DIAGNOSIS — I2510 Atherosclerotic heart disease of native coronary artery without angina pectoris: Secondary | ICD-10-CM | POA: Insufficient documentation

## 2017-12-18 DIAGNOSIS — Z9981 Dependence on supplemental oxygen: Secondary | ICD-10-CM | POA: Diagnosis not present

## 2017-12-18 DIAGNOSIS — M4856XA Collapsed vertebra, not elsewhere classified, lumbar region, initial encounter for fracture: Secondary | ICD-10-CM | POA: Insufficient documentation

## 2017-12-18 DIAGNOSIS — Z419 Encounter for procedure for purposes other than remedying health state, unspecified: Secondary | ICD-10-CM

## 2017-12-18 HISTORY — PX: KYPHOPLASTY: SHX5884

## 2017-12-18 SURGERY — KYPHOPLASTY
Anesthesia: Monitor Anesthesia Care | Site: Spine Lumbar

## 2017-12-18 MED ORDER — ONDANSETRON HCL 4 MG PO TABS: 4.0000 mg | ORAL_TABLET | Freq: Four times a day (QID) | ORAL | Status: DC | PRN

## 2017-12-18 MED ORDER — HYDROCODONE-ACETAMINOPHEN 5-325 MG PO TABS
1.0000 | ORAL_TABLET | ORAL | Status: DC | PRN
  Administered 2017-12-18: 1 via ORAL

## 2017-12-18 MED ORDER — ONDANSETRON HCL 4 MG/2ML IJ SOLN: 4.0000 mg | Freq: Four times a day (QID) | INTRAMUSCULAR | Status: DC | PRN

## 2017-12-18 MED ORDER — ONDANSETRON HCL 4 MG/2ML IJ SOLN: 4.0000 mg | Freq: Once | INTRAMUSCULAR | Status: DC | PRN

## 2017-12-18 MED ORDER — SODIUM CHLORIDE 0.9 % IV SOLN: INTRAVENOUS | Status: DC

## 2017-12-18 MED ORDER — IOPAMIDOL (ISOVUE-M 200) INJECTION 41%
INTRAMUSCULAR | Status: AC
  Filled 2017-12-18: qty 10

## 2017-12-18 MED ORDER — FENTANYL CITRATE (PF) 100 MCG/2ML IJ SOLN
INTRAMUSCULAR | Status: AC
  Filled 2017-12-18: qty 2

## 2017-12-18 MED ORDER — METOCLOPRAMIDE HCL 5 MG/ML IJ SOLN: 5.0000 mg | Freq: Three times a day (TID) | INTRAMUSCULAR | Status: DC | PRN

## 2017-12-18 MED ORDER — METHYLPREDNISOLONE SODIUM SUCC 125 MG IJ SOLR
INTRAMUSCULAR | Status: AC
  Administered 2017-12-18: 60 mg via INTRAVENOUS
  Filled 2017-12-18: qty 2

## 2017-12-18 MED ORDER — BUPIVACAINE-EPINEPHRINE (PF) 0.5% -1:200000 IJ SOLN
INTRAMUSCULAR | Status: AC
  Filled 2017-12-18: qty 30

## 2017-12-18 MED ORDER — PROPOFOL 10 MG/ML IV BOLUS
INTRAVENOUS | Status: DC | PRN
  Administered 2017-12-18 (×8): 10 mg via INTRAVENOUS

## 2017-12-18 MED ORDER — METHYLPREDNISOLONE SODIUM SUCC 125 MG IJ SOLR
60.0000 mg | Freq: Once | INTRAMUSCULAR | Status: AC
  Administered 2017-12-18: 60 mg via INTRAVENOUS

## 2017-12-18 MED ORDER — PROPOFOL 10 MG/ML IV BOLUS
INTRAVENOUS | Status: AC
  Filled 2017-12-18: qty 20

## 2017-12-18 MED ORDER — IPRATROPIUM-ALBUTEROL 0.5-2.5 (3) MG/3ML IN SOLN
RESPIRATORY_TRACT | Status: AC
  Filled 2017-12-18: qty 3

## 2017-12-18 MED ORDER — BUPIVACAINE-EPINEPHRINE (PF) 0.5% -1:200000 IJ SOLN
INTRAMUSCULAR | Status: AC
  Filled 2017-12-18: qty 60

## 2017-12-18 MED ORDER — FENTANYL CITRATE (PF) 100 MCG/2ML IJ SOLN
25.0000 ug | INTRAMUSCULAR | Status: DC | PRN
  Administered 2017-12-18: 25 ug via INTRAVENOUS

## 2017-12-18 MED ORDER — FENTANYL CITRATE (PF) 100 MCG/2ML IJ SOLN
INTRAMUSCULAR | Status: AC
  Administered 2017-12-18: 25 ug via INTRAVENOUS
  Filled 2017-12-18: qty 2

## 2017-12-18 MED ORDER — IPRATROPIUM-ALBUTEROL 0.5-2.5 (3) MG/3ML IN SOLN: 3.0000 mL | Freq: Once | RESPIRATORY_TRACT | Status: DC

## 2017-12-18 MED ORDER — LIDOCAINE HCL 1 % IJ SOLN
INTRAMUSCULAR | Status: DC | PRN
  Administered 2017-12-18: 20 mL

## 2017-12-18 MED ORDER — HYDROCODONE-ACETAMINOPHEN 5-325 MG PO TABS
ORAL_TABLET | ORAL | Status: AC
  Filled 2017-12-18: qty 1

## 2017-12-18 MED ORDER — METOCLOPRAMIDE HCL 10 MG PO TABS: 5.0000 mg | ORAL_TABLET | Freq: Three times a day (TID) | ORAL | Status: DC | PRN

## 2017-12-18 MED ORDER — BUPIVACAINE-EPINEPHRINE (PF) 0.5% -1:200000 IJ SOLN
INTRAMUSCULAR | Status: DC | PRN
  Administered 2017-12-18: 10 mL via PERINEURAL

## 2017-12-18 MED ORDER — FENTANYL CITRATE (PF) 100 MCG/2ML IJ SOLN
INTRAMUSCULAR | Status: DC | PRN
  Administered 2017-12-18: 50 ug via INTRAVENOUS
  Administered 2017-12-18 (×2): 25 ug via INTRAVENOUS

## 2017-12-18 MED ORDER — LIDOCAINE HCL (PF) 1 % IJ SOLN
INTRAMUSCULAR | Status: AC
  Filled 2017-12-18: qty 30

## 2017-12-18 MED ORDER — KETAMINE HCL 50 MG/ML IJ SOLN
INTRAMUSCULAR | Status: DC | PRN
  Administered 2017-12-18: 50 mg via INTRAMUSCULAR

## 2017-12-18 MED ORDER — LACTATED RINGERS IV SOLN
INTRAVENOUS | Status: DC
  Administered 2017-12-18 (×2): via INTRAVENOUS

## 2017-12-18 SURGICAL SUPPLY — 16 items
CEMENT KYPHON CX01A KIT/MIXER (Cement) ×2 IMPLANT
COVER WAND RF STERILE (DRAPES) ×2 IMPLANT
DERMABOND ADVANCED (GAUZE/BANDAGES/DRESSINGS) ×1
DERMABOND ADVANCED .7 DNX12 (GAUZE/BANDAGES/DRESSINGS) ×1 IMPLANT
DEVICE BIOPSY BONE KYPHX (INSTRUMENTS) ×1 IMPLANT
DRAPE C-ARM XRAY 36X54 (DRAPES) ×2 IMPLANT
DURAPREP 26ML APPLICATOR (WOUND CARE) ×2 IMPLANT
GLOVE SURG SYN 9.0  PF PI (GLOVE) ×1
GLOVE SURG SYN 9.0 PF PI (GLOVE) ×1 IMPLANT
GOWN SRG 2XL LVL 4 RGLN SLV (GOWNS) ×1 IMPLANT
GOWN STRL NON-REIN 2XL LVL4 (GOWNS) ×1
GOWN STRL REUS W/ TWL LRG LVL3 (GOWN DISPOSABLE) ×1 IMPLANT
GOWN STRL REUS W/TWL LRG LVL3 (GOWN DISPOSABLE) ×1
PACK KYPHOPLASTY (MISCELLANEOUS) ×2 IMPLANT
STRAP SAFETY 5IN WIDE (MISCELLANEOUS) ×2 IMPLANT
TRAY KYPHOPAK 15/3 EXPRESS 1ST (MISCELLANEOUS) ×1 IMPLANT

## 2017-12-18 NOTE — Discharge Instructions (Addendum)
Take it easy today and tomorrow.  Resume more normal activities on Thursday.  Remove Band-Aid on Thursday then okay to shower.  AMBULATORY SURGERY  DISCHARGE INSTRUCTIONS   1) The drugs that you were given will stay in your system until tomorrow so for the next 24 hours you should not:  A) Drive an automobile B) Make any legal decisions C) Drink any alcoholic beverage   2) You may resume regular meals tomorrow.  Today it is better to start with liquids and gradually work up to solid foods.  You may eat anything you prefer, but it is better to start with liquids, then soup and crackers, and gradually work up to solid foods.   3) Please notify your doctor immediately if you have any unusual bleeding, trouble breathing, redness and pain at the surgery site, drainage, fever, or pain not relieved by medication.    4) Additional Instructions:        Please contact your physician with any problems or Same Day Surgery at [phone], Monday through Friday 6 am to 4 pm, or [place] at [facility] number at [phone].

## 2017-12-18 NOTE — Anesthesia Preprocedure Evaluation (Addendum)
Anesthesia Evaluation  Patient identified by MRN, date of birth, ID band Patient awake    Reviewed: Allergy & Precautions, NPO status , Patient's Chart, lab work & pertinent test results, reviewed documented beta blocker date and time   History of Anesthesia Complications (+) [name], Family history of anesthesia reaction and history of anesthetic complications  Airway Mallampati: II  TM Distance: >3 FB     Dental  (+) Chipped   Pulmonary shortness of breath,           Cardiovascular hypertension, Pt. on medications and Pt. on home beta blockers + CAD and + Past MI  + dysrhythmias Atrial Fibrillation      Neuro/Psych    GI/Hepatic GERD  Controlled,  Endo/Other    Renal/GU      Musculoskeletal  (+) Arthritis ,   Abdominal   Peds  Hematology   Anesthesia Other Findings Hi risk pt., family aware. Of risks. End stage pulmonary situation, however in intense pain, wants the operation.  Lungs rales rhonchi. Will give SVN with duoneb and 60 mg IV solumedrol.  Reproductive/Obstetrics                            Anesthesia Physical Anesthesia Plan  ASA: IV  Anesthesia Plan: MAC   Post-op Pain Management:    Induction:   [name] Risk Score and Plan:   Airway Management Planned:   Additional Equipment:   Intra-op Plan:   Post-operative Plan:   Informed Consent: I have reviewed the patients History and Physical, chart, labs and discussed the procedure including the risks, benefits and alternatives for the proposed anesthesia with the patient or authorized representative who has indicated his/her understanding and acceptance.     Plan Discussed with: CRNA  Anesthesia Plan Comments:        Anesthesia Quick Evaluation

## 2017-12-18 NOTE — Anesthesia Post-op Follow-up Note (Signed)
Anesthesia QCDR form completed.        

## 2017-12-18 NOTE — Anesthesia Postprocedure Evaluation (Signed)
Anesthesia Post Note  Patient: Ana Washington  Procedure(s) Performed: KYPHOPLASTY L1 (N/A Spine Lumbar)  Patient location during evaluation: PACU Anesthesia Type: MAC Level of consciousness: awake and alert Pain management: pain level controlled Vital Signs Assessment: post-procedure vital signs reviewed and stable Respiratory status: spontaneous breathing, nonlabored ventilation, respiratory function stable and patient connected to nasal cannula oxygen Cardiovascular status: stable and blood pressure returned to baseline Postop Assessment: no apparent nausea or vomiting Anesthetic complications: no     Last Vitals:  Vitals:   12/18/17 1716 12/18/17 1755  BP: (!) 146/74 (!) 163/88  Pulse: 92 89  Resp: (!) 22 (!) 22  Temp: 36.9 C 37.1 C  SpO2: 94% 97%    Last Pain:  Vitals:   12/18/17 1755  TempSrc: Temporal  PainSc:                  Naia Ruff S

## 2017-12-18 NOTE — Anesthesia Procedure Notes (Signed)
Date/Time: 12/18/2017 4:13 PM Performed by: Nelda Marseille, CRNA Pre-anesthesia Checklist: Patient identified, Emergency Drugs available, Suction available, Patient being monitored and Timeout performed Oxygen Delivery Method: Nasal cannula

## 2017-12-18 NOTE — H&P (Signed)
Reviewed paper H+P, will be scanned into chart. MRI shows L1 new compression fracture, L5 is old compression deformity. Plan L1 kyphoplasty today

## 2017-12-18 NOTE — Care Management (Signed)
Hi risk pt, family aware. End stage pulmonary disease. Lungs - rales rhonchi. Will start duoneb SVN and 60 mg IV solumedrol.

## 2017-12-18 NOTE — Op Note (Signed)
12/18/2017  4:29 PM  PATIENT:  Ana Washington  72 y.o. female  PRE-OPERATIVE DIAGNOSIS:  closed wedge compression fracture of 1st lumbar vertebra  POST-OPERATIVE DIAGNOSIS:  closed wedge compression fracture of 1st lumbar vertebra  PROCEDURE:  Procedure(s): KYPHOPLASTY L1 (N/A)  SURGEON: Laurene Footman, MD  ASSISTANTS: None  ANESTHESIA:   local and MAC  EBL:  No intake/output data recorded.  BLOOD ADMINISTERED:none  DRAINS: none   LOCAL MEDICATIONS USED:  MARCAINE    and XYLOCAINE   SPECIMEN:  No Specimen  DISPOSITION OF SPECIMEN:  N/A  COUNTS:  YES  TOURNIQUET:  * No tourniquets in log *  IMPLANTS: Bone cement  DICTATION: .Dragon Dictation  patient was brought to the operating room and after adequate anesthesia was obtained the patient was placed prone.  C arm was brought in in good visualization of the affected level obtained on both AP and lateral projections.  After patient identification and timeout procedures were completed, local anesthetic was infiltrated with 10 cc 1% Xylocaine infiltrated subcutaneously.  This is done the area on the right side of the planned approach.  The back was then prepped and draped you sterile manner and repeat timeout procedure carried out.  A spinal needle was brought down to the pedicle on the right side of L1 and a 50-50 mix of 1% Xylocaine half percent Sensorcaine with epinephrine total of 20 cc injected.  After allowing this to set a small incision was made and the trocar was advanced into the vertebral body in an extrapedicular fashion.  Biopsy was attempted but not obtained.  Drilling was carried out balloon inserted with inflation to 4 cc.  When the cement was appropriate consistency 3 5 cc were injected into the vertebral body without extravasation, good fill superior to inferior endplates and from right to left sides along the inferior endplate.  After the cemented set the trochars removed and permanent serum views obtained.  The  wound was closed with Dermabond followed by Band-Aid  PLAN OF CARE: Discharge to home after PACU  PATIENT DISPOSITION:  PACU - hemodynamically stable.

## 2017-12-18 NOTE — Transfer of Care (Signed)
Immediate Anesthesia Transfer of Care Note  Patient: Ana Washington  Procedure(s) Performed: KYPHOPLASTY L1 (N/A Spine Lumbar)  Patient Location: PACU  Anesthesia Type:General  Level of Consciousness: awake, alert  and oriented  Airway & Oxygen Therapy: Patient Spontanous Breathing and Patient connected to nasal cannula oxygen  Post-op Assessment: Report given to RN and Post -op Vital signs reviewed and stable  Post vital signs: Reviewed and stable  Last Vitals:  Vitals Value Taken Time  BP 180/94 12/18/2017  4:33 PM  Temp    Pulse 101 12/18/2017  4:35 PM  Resp 32 12/18/2017  4:35 PM  SpO2 99 % 12/18/2017  4:35 PM  Vitals shown include unvalidated device data.  Last Pain:  Vitals:   12/18/17 1409  TempSrc: Temporal      Patients Stated Pain Goal: 5 (76/80/88 1103)  Complications: No apparent anesthesia complications

## 2017-12-18 NOTE — OR Nursing (Signed)
Patient with very congested cough productive of yellow/green sputum. Dr. Marcello Moores aware and has listened to her lungs. Duoneb given and solumedrol given as ordered.

## 2017-12-19 ENCOUNTER — Encounter: Payer: Self-pay | Admitting: Orthopedic Surgery

## 2018-01-03 ENCOUNTER — Other Ambulatory Visit: Payer: Self-pay | Admitting: Orthopedic Surgery

## 2018-01-03 ENCOUNTER — Other Ambulatory Visit (HOSPITAL_COMMUNITY): Payer: Self-pay | Admitting: Orthopedic Surgery

## 2018-01-03 DIAGNOSIS — S22080A Wedge compression fracture of T11-T12 vertebra, initial encounter for closed fracture: Secondary | ICD-10-CM

## 2018-01-04 ENCOUNTER — Ambulatory Visit (HOSPITAL_COMMUNITY)
Admission: RE | Admit: 2018-01-04 | Discharge: 2018-01-04 | Disposition: A | Discharge status: Home / Self Care | Payer: Medicare HMO | Source: Ambulatory Visit | Attending: Orthopedic Surgery | Admitting: Orthopedic Surgery

## 2018-01-04 DIAGNOSIS — X58XXXA Exposure to other specified factors, initial encounter: Secondary | ICD-10-CM | POA: Diagnosis not present

## 2018-01-04 DIAGNOSIS — Z9889 Other specified postprocedural states: Secondary | ICD-10-CM | POA: Insufficient documentation

## 2018-01-04 DIAGNOSIS — S22080A Wedge compression fracture of T11-T12 vertebra, initial encounter for closed fracture: Secondary | ICD-10-CM | POA: Diagnosis not present

## 2018-01-04 DIAGNOSIS — R29890 Loss of height: Secondary | ICD-10-CM | POA: Diagnosis not present

## 2018-01-05 ENCOUNTER — Encounter
Admission: RE | Disposition: A | Discharge status: Home / Self Care | Payer: Self-pay | Source: Ambulatory Visit | Attending: Orthopedic Surgery

## 2018-01-05 ENCOUNTER — Ambulatory Visit: Payer: Medicare Other | Admitting: Anesthesiology

## 2018-01-05 ENCOUNTER — Ambulatory Visit
Admission: RE | Admit: 2018-01-05 | Discharge: 2018-01-06 | Disposition: A | Discharge status: Home / Self Care | Payer: Medicare Other | Source: Ambulatory Visit | Attending: Orthopedic Surgery | Admitting: Orthopedic Surgery

## 2018-01-05 ENCOUNTER — Ambulatory Visit: Payer: Medicare Other

## 2018-01-05 ENCOUNTER — Other Ambulatory Visit: Payer: Self-pay

## 2018-01-05 DIAGNOSIS — Z7982 Long term (current) use of aspirin: Secondary | ICD-10-CM | POA: Insufficient documentation

## 2018-01-05 DIAGNOSIS — I10 Essential (primary) hypertension: Secondary | ICD-10-CM | POA: Diagnosis not present

## 2018-01-05 DIAGNOSIS — J449 Chronic obstructive pulmonary disease, unspecified: Secondary | ICD-10-CM | POA: Diagnosis not present

## 2018-01-05 DIAGNOSIS — X509XXA Other and unspecified overexertion or strenuous movements or postures, initial encounter: Secondary | ICD-10-CM | POA: Insufficient documentation

## 2018-01-05 DIAGNOSIS — K219 Gastro-esophageal reflux disease without esophagitis: Secondary | ICD-10-CM | POA: Diagnosis not present

## 2018-01-05 DIAGNOSIS — Z9981 Dependence on supplemental oxygen: Secondary | ICD-10-CM | POA: Insufficient documentation

## 2018-01-05 DIAGNOSIS — I2510 Atherosclerotic heart disease of native coronary artery without angina pectoris: Secondary | ICD-10-CM | POA: Insufficient documentation

## 2018-01-05 DIAGNOSIS — J479 Bronchiectasis, uncomplicated: Secondary | ICD-10-CM | POA: Insufficient documentation

## 2018-01-05 DIAGNOSIS — Y92000 Kitchen of unspecified non-institutional (private) residence as  the place of occurrence of the external cause: Secondary | ICD-10-CM | POA: Diagnosis not present

## 2018-01-05 DIAGNOSIS — M810 Age-related osteoporosis without current pathological fracture: Secondary | ICD-10-CM | POA: Insufficient documentation

## 2018-01-05 DIAGNOSIS — I4891 Unspecified atrial fibrillation: Secondary | ICD-10-CM | POA: Diagnosis not present

## 2018-01-05 DIAGNOSIS — S22080A Wedge compression fracture of T11-T12 vertebra, initial encounter for closed fracture: Secondary | ICD-10-CM | POA: Insufficient documentation

## 2018-01-05 DIAGNOSIS — Z792 Long term (current) use of antibiotics: Secondary | ICD-10-CM | POA: Insufficient documentation

## 2018-01-05 DIAGNOSIS — Z7901 Long term (current) use of anticoagulants: Secondary | ICD-10-CM | POA: Diagnosis not present

## 2018-01-05 DIAGNOSIS — Z419 Encounter for procedure for purposes other than remedying health state, unspecified: Secondary | ICD-10-CM

## 2018-01-05 DIAGNOSIS — I252 Old myocardial infarction: Secondary | ICD-10-CM | POA: Insufficient documentation

## 2018-01-05 DIAGNOSIS — Z79899 Other long term (current) drug therapy: Secondary | ICD-10-CM | POA: Insufficient documentation

## 2018-01-05 DIAGNOSIS — Z9889 Other specified postprocedural states: Secondary | ICD-10-CM

## 2018-01-05 HISTORY — PX: KYPHOPLASTY: SHX5884

## 2018-01-05 SURGERY — KYPHOPLASTY
Anesthesia: General | Site: Back

## 2018-01-05 MED ORDER — ONDANSETRON HCL 4 MG PO TABS: 4.0000 mg | ORAL_TABLET | Freq: Four times a day (QID) | ORAL | Status: DC | PRN

## 2018-01-05 MED ORDER — LIDOCAINE HCL 1 % IJ SOLN
INTRAMUSCULAR | Status: DC | PRN
  Administered 2018-01-05: 30 mL

## 2018-01-05 MED ORDER — SODIUM CHLORIDE 0.9 % IV SOLN: INTRAVENOUS | Status: DC

## 2018-01-05 MED ORDER — PROPOFOL 500 MG/50ML IV EMUL
INTRAVENOUS | Status: AC
  Filled 2018-01-05: qty 50

## 2018-01-05 MED ORDER — AZITHROMYCIN 250 MG PO TABS
250.0000 mg | ORAL_TABLET | Freq: Every day | ORAL | Status: DC
  Administered 2018-01-06: 250 mg via ORAL
  Filled 2018-01-05: qty 1

## 2018-01-05 MED ORDER — FENTANYL CITRATE (PF) 100 MCG/2ML IJ SOLN
INTRAMUSCULAR | Status: AC
  Filled 2018-01-05: qty 2

## 2018-01-05 MED ORDER — APIXABAN 5 MG PO TABS
5.0000 mg | ORAL_TABLET | Freq: Every day | ORAL | Status: DC
  Administered 2018-01-06: 5 mg via ORAL
  Filled 2018-01-05: qty 1

## 2018-01-05 MED ORDER — POTASSIUM CHLORIDE CRYS ER 20 MEQ PO TBCR
10.0000 meq | EXTENDED_RELEASE_TABLET | Freq: Every day | ORAL | Status: DC
  Administered 2018-01-06: 10 meq via ORAL
  Filled 2018-01-05: qty 1

## 2018-01-05 MED ORDER — ADULT MULTIVITAMIN W/MINERALS CH
1.0000 | ORAL_TABLET | Freq: Every day | ORAL | Status: DC
  Administered 2018-01-06: 1 via ORAL
  Filled 2018-01-05: qty 1

## 2018-01-05 MED ORDER — MORPHINE SULFATE (CONCENTRATE) 10 MG/0.5ML PO SOLN: 20.0000 mg | Freq: Every evening | ORAL | Status: DC | PRN

## 2018-01-05 MED ORDER — CLINDAMYCIN PHOSPHATE 900 MG/50ML IV SOLN
900.0000 mg | Freq: Once | INTRAVENOUS | Status: AC
  Administered 2018-01-05: 900 mg via INTRAVENOUS

## 2018-01-05 MED ORDER — FENTANYL CITRATE (PF) 100 MCG/2ML IJ SOLN: 25.0000 ug | INTRAMUSCULAR | Status: DC | PRN

## 2018-01-05 MED ORDER — DILTIAZEM HCL 30 MG PO TABS: 30.0000 mg | ORAL_TABLET | ORAL | Status: DC | PRN

## 2018-01-05 MED ORDER — AMLODIPINE BESYLATE 5 MG PO TABS
5.0000 mg | ORAL_TABLET | Freq: Every day | ORAL | Status: DC
  Administered 2018-01-06: 5 mg via ORAL
  Filled 2018-01-05: qty 1

## 2018-01-05 MED ORDER — METHYLPREDNISOLONE SODIUM SUCC 125 MG IJ SOLR
40.0000 mg | Freq: Four times a day (QID) | INTRAMUSCULAR | Status: AC
  Administered 2018-01-06: 40 mg via INTRAVENOUS
  Filled 2018-01-05: qty 2

## 2018-01-05 MED ORDER — FERROUS SULFATE 325 (65 FE) MG PO TABS
325.0000 mg | ORAL_TABLET | Freq: Every day | ORAL | Status: DC
  Administered 2018-01-06: 325 mg via ORAL
  Filled 2018-01-05: qty 1

## 2018-01-05 MED ORDER — METOCLOPRAMIDE HCL 10 MG PO TABS: 5.0000 mg | ORAL_TABLET | Freq: Three times a day (TID) | ORAL | Status: DC | PRN

## 2018-01-05 MED ORDER — VITAMIN D 25 MCG (1000 UNIT) PO TABS
1000.0000 [IU] | ORAL_TABLET | Freq: Every day | ORAL | Status: DC
  Administered 2018-01-06: 1000 [IU] via ORAL
  Filled 2018-01-05: qty 1

## 2018-01-05 MED ORDER — BUPIVACAINE-EPINEPHRINE (PF) 0.5% -1:200000 IJ SOLN
INTRAMUSCULAR | Status: AC
  Filled 2018-01-05: qty 30

## 2018-01-05 MED ORDER — METAXALONE 800 MG PO TABS
800.0000 mg | ORAL_TABLET | Freq: Three times a day (TID) | ORAL | Status: DC
  Administered 2018-01-06: 800 mg via ORAL
  Filled 2018-01-05 (×3): qty 1

## 2018-01-05 MED ORDER — TRAMADOL HCL 50 MG PO TABS
ORAL_TABLET | ORAL | Status: AC
  Administered 2018-01-05: 100 mg via ORAL
  Filled 2018-01-05: qty 2

## 2018-01-05 MED ORDER — METOCLOPRAMIDE HCL 5 MG/ML IJ SOLN: 5.0000 mg | Freq: Three times a day (TID) | INTRAMUSCULAR | Status: DC | PRN

## 2018-01-05 MED ORDER — LEVOFLOXACIN 500 MG PO TABS
500.0000 mg | ORAL_TABLET | Freq: Every day | ORAL | Status: DC
  Administered 2018-01-06: 500 mg via ORAL
  Filled 2018-01-05: qty 1

## 2018-01-05 MED ORDER — ONDANSETRON HCL 4 MG/2ML IJ SOLN
INTRAMUSCULAR | Status: AC
  Administered 2018-01-05: 4 mg via INTRAVENOUS
  Filled 2018-01-05: qty 2

## 2018-01-05 MED ORDER — PANTOPRAZOLE SODIUM 40 MG PO TBEC
40.0000 mg | DELAYED_RELEASE_TABLET | Freq: Every day | ORAL | Status: DC
  Administered 2018-01-06: 40 mg via ORAL
  Filled 2018-01-05: qty 1

## 2018-01-05 MED ORDER — TRAMADOL HCL 50 MG PO TABS
100.0000 mg | ORAL_TABLET | Freq: Once | ORAL | Status: AC
  Administered 2018-01-05: 100 mg via ORAL

## 2018-01-05 MED ORDER — TRAMADOL HCL 50 MG PO TABS: 50.0000 mg | ORAL_TABLET | Freq: Four times a day (QID) | ORAL | Status: DC | PRN

## 2018-01-05 MED ORDER — CALCIUM CARBONATE-VITAMIN D 500-200 MG-UNIT PO TABS
1.0000 | ORAL_TABLET | Freq: Every day | ORAL | Status: DC
  Administered 2018-01-06: 1 via ORAL
  Filled 2018-01-05: qty 1

## 2018-01-05 MED ORDER — BUPIVACAINE-EPINEPHRINE (PF) 0.5% -1:200000 IJ SOLN
INTRAMUSCULAR | Status: DC | PRN
  Administered 2018-01-05: 20 mL via PERINEURAL

## 2018-01-05 MED ORDER — PROPOFOL 500 MG/50ML IV EMUL
INTRAVENOUS | Status: DC | PRN
  Administered 2018-01-05: 30 ug/kg/min via INTRAVENOUS

## 2018-01-05 MED ORDER — ACETAMINOPHEN 500 MG PO TABS
1000.0000 mg | ORAL_TABLET | ORAL | Status: DC
  Administered 2018-01-06 (×2): 1000 mg via ORAL
  Filled 2018-01-05 (×3): qty 2

## 2018-01-05 MED ORDER — PROPOFOL 10 MG/ML IV BOLUS
INTRAVENOUS | Status: DC | PRN
  Administered 2018-01-05 (×2): 20 mg via INTRAVENOUS

## 2018-01-05 MED ORDER — LOSARTAN POTASSIUM 50 MG PO TABS
100.0000 mg | ORAL_TABLET | Freq: Every day | ORAL | Status: DC
  Administered 2018-01-06: 100 mg via ORAL
  Filled 2018-01-05: qty 2

## 2018-01-05 MED ORDER — RISAQUAD PO CAPS
1.0000 | ORAL_CAPSULE | Freq: Every day | ORAL | Status: DC
  Administered 2018-01-06: 1 via ORAL
  Filled 2018-01-05: qty 1

## 2018-01-05 MED ORDER — LACTATED RINGERS IV SOLN
INTRAVENOUS | Status: DC
  Administered 2018-01-05: 17:00:00 via INTRAVENOUS

## 2018-01-05 MED ORDER — ONDANSETRON HCL 4 MG/2ML IJ SOLN
4.0000 mg | Freq: Once | INTRAMUSCULAR | Status: AC | PRN
  Administered 2018-01-05: 4 mg via INTRAVENOUS

## 2018-01-05 MED ORDER — IOPAMIDOL (ISOVUE-M 200) INJECTION 41%
INTRAMUSCULAR | Status: DC | PRN
  Administered 2018-01-05: 40 mL

## 2018-01-05 MED ORDER — METOPROLOL SUCCINATE ER 50 MG PO TB24: 50.0000 mg | ORAL_TABLET | Freq: Every day | ORAL | Status: DC

## 2018-01-05 MED ORDER — PREDNISONE 10 MG PO TABS
10.0000 mg | ORAL_TABLET | Freq: Every day | ORAL | Status: DC
  Administered 2018-01-06: 10 mg via ORAL
  Filled 2018-01-05: qty 1

## 2018-01-05 MED ORDER — TRAMADOL HCL 50 MG PO TABS
50.0000 mg | ORAL_TABLET | Freq: Four times a day (QID) | ORAL | Status: DC | PRN
  Administered 2018-01-06: 50 mg via ORAL
  Filled 2018-01-05: qty 1

## 2018-01-05 MED ORDER — VITAMIN C 500 MG PO TABS
1000.0000 mg | ORAL_TABLET | Freq: Every day | ORAL | Status: DC
  Administered 2018-01-06: 1000 mg via ORAL
  Filled 2018-01-05: qty 2

## 2018-01-05 MED ORDER — FENTANYL CITRATE (PF) 100 MCG/2ML IJ SOLN
INTRAMUSCULAR | Status: DC | PRN
  Administered 2018-01-05 (×2): 25 ug via INTRAVENOUS
  Administered 2018-01-05: 50 ug via INTRAVENOUS

## 2018-01-05 MED ORDER — METOPROLOL TARTRATE 5 MG/5ML IV SOLN
2.5000 mg | INTRAVENOUS | Status: AC | PRN
  Administered 2018-01-05 (×2): 2.5 mg via INTRAVENOUS

## 2018-01-05 MED ORDER — LORAZEPAM 0.5 MG PO TABS
0.5000 mg | ORAL_TABLET | ORAL | Status: DC
  Administered 2018-01-06 (×2): 0.5 mg via ORAL
  Filled 2018-01-05 (×2): qty 1

## 2018-01-05 MED ORDER — HYDROCOD POLST-CPM POLST ER 10-8 MG/5ML PO SUER: 6.0000 mL | Freq: Every day | ORAL | Status: DC

## 2018-01-05 MED ORDER — GABAPENTIN 300 MG PO CAPS: 300.0000 mg | ORAL_CAPSULE | Freq: Every day | ORAL | Status: DC

## 2018-01-05 MED ORDER — METOPROLOL TARTRATE 5 MG/5ML IV SOLN
INTRAVENOUS | Status: AC
  Administered 2018-01-05: 2.5 mg via INTRAVENOUS
  Filled 2018-01-05: qty 5

## 2018-01-05 MED ORDER — SODIUM CHLORIDE FLUSH 0.9 % IV SOLN
INTRAVENOUS | Status: AC
  Filled 2018-01-05: qty 10

## 2018-01-05 MED ORDER — ONDANSETRON HCL 4 MG/2ML IJ SOLN: 4.0000 mg | Freq: Four times a day (QID) | INTRAMUSCULAR | Status: DC | PRN

## 2018-01-05 MED ORDER — METOPROLOL TARTRATE 5 MG/5ML IV SOLN
INTRAVENOUS | Status: DC | PRN
  Administered 2018-01-05 (×2): 1 mg via INTRAVENOUS

## 2018-01-05 MED ORDER — SODIUM CHLORIDE 0.9 % IN NEBU
3.0000 mL | INHALATION_SOLUTION | Freq: Two times a day (BID) | RESPIRATORY_TRACT | Status: DC | PRN
  Filled 2018-01-05: qty 3

## 2018-01-05 MED ORDER — ASPIRIN EC 81 MG PO TBEC
81.0000 mg | DELAYED_RELEASE_TABLET | Freq: Every day | ORAL | Status: DC
  Administered 2018-01-06: 81 mg via ORAL
  Filled 2018-01-05: qty 1

## 2018-01-05 MED ORDER — CLINDAMYCIN PHOSPHATE 900 MG/50ML IV SOLN
INTRAVENOUS | Status: AC
  Filled 2018-01-05: qty 50

## 2018-01-05 SURGICAL SUPPLY — 17 items
CEMENT KYPHON CX01A KIT/MIXER (Cement) ×2 IMPLANT
COVER WAND RF STERILE (DRAPES) ×2 IMPLANT
DERMABOND ADVANCED (GAUZE/BANDAGES/DRESSINGS) ×1
DERMABOND ADVANCED .7 DNX12 (GAUZE/BANDAGES/DRESSINGS) ×1 IMPLANT
DEVICE BIOPSY BONE KYPHX (INSTRUMENTS) ×2 IMPLANT
DRAPE C-ARM XRAY 36X54 (DRAPES) ×2 IMPLANT
DURAPREP 26ML APPLICATOR (WOUND CARE) ×2 IMPLANT
GLOVE SURG SYN 9.0  PF PI (GLOVE) ×1
GLOVE SURG SYN 9.0 PF PI (GLOVE) ×1 IMPLANT
GOWN SRG 2XL LVL 4 RGLN SLV (GOWNS) ×1 IMPLANT
GOWN STRL NON-REIN 2XL LVL4 (GOWNS) ×1
GOWN STRL REUS W/ TWL LRG LVL3 (GOWN DISPOSABLE) ×1 IMPLANT
GOWN STRL REUS W/TWL LRG LVL3 (GOWN DISPOSABLE) ×1
PACK KYPHOPLASTY (MISCELLANEOUS) ×2 IMPLANT
STRAP SAFETY 5IN WIDE (MISCELLANEOUS) ×2 IMPLANT
TRAY KYPHOPAK 15/3 EXPRESS 1ST (MISCELLANEOUS) ×2 IMPLANT
TRAY KYPHOPAK 20/3 EXPRESS 1ST (MISCELLANEOUS) ×2 IMPLANT

## 2018-01-05 NOTE — Anesthesia Post-op Follow-up Note (Signed)
Anesthesia QCDR form completed.        

## 2018-01-05 NOTE — Progress Notes (Signed)
Patient IV removed and ready for discharge after taking tramadol 100mg  for pain as prescribed by Dr. Rudene Christians. Patient up to the wheelchair then felt nauseated, diaphoretic and weak. Patient states she feels too sick to go home. Dr. Rudene Christians notified. He is in the OR but will evaluate patient as soon as possible. Per instructions, we will take patient back to PACU to lay down and await MD.

## 2018-01-05 NOTE — Discharge Instructions (Addendum)
AMBULATORY SURGERY  DISCHARGE INSTRUCTIONS   1) The drugs that you were given will stay in your system until tomorrow so for the next 24 hours you should not:  A) Drive an automobile B) Make any legal decisions C) Drink any alcoholic beverage   2) You may resume regular meals tomorrow.  Today it is better to start with liquids and gradually work up to solid foods.  You may eat anything you prefer, but it is better to start with liquids, then soup and crackers, and gradually work up to solid foods.   3) Please notify your doctor immediately if you have any unusual bleeding, trouble breathing, redness and pain at the surgery site, drainage, fever, or pain not relieved by medication.    4) Additional Instructions:        Please contact your physician with any problems or Same Day Surgery at [phone], Monday through Friday 6 am to 4 pm, or [place] at [facility] number at [phone].Take it easy today and tomorrow and resume more normal activities on Sunday.  Remove Band-Aids on Sunday then okay to shower.

## 2018-01-05 NOTE — H&P (Signed)
Ana Washington is a 72 y.o. female who presents today status post L1 kyphoplasty 12/18/2017. Patient is also had previous L2, L3 and T9 kyphoplasty procedures. Patient states she was initially doing well after the L1 kyphoplasty procedure until she closed the refrigerator door with her hip and felt a pop in her back. She is had severe debilitating pain that feels like a new compression fracture. She is been taking gabapentin, Norco, Skelaxin with mild relief. She has severe pain with standing, sitting and lying down. No numbness tingling radicular symptoms in the lower extremities.  Past Medical History: Past Medical History:  Diagnosis Date  . Adult bronchiectasis (CMS-HCC)  . Atrial fibrillation (CMS-HCC)  . Compression fracture 2013  . Diverticulitis  . Hypertension  . 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CMS-HCC)  and dumping syndrome  . Migraines  . Mycobacterium avium complex (CMS-HCC)  . Myocardial infarction (CMS-HCC) 2007  non-STEMI/No CAD  . non ischemic cardiomyopathy  . Osteoporosis, post-menopausal  . Other dysphagia 09/13/2016  . Peptic ulcer disease  . [name] (postoperative nausea and vomiting), unspecified  . Psoriasis  . Unintended weight loss 09/13/2016   Past Surgical History: Past Surgical History:  Procedure Laterality Date  . BRONCHOSCOPY FLEXIBLE Bilateral 09/03/2014  Procedure: BRONCHOSCOPY W/TRANSBRONCHIAL BIOPSY FLEXIBLE; Surgeon: Cherlynn Kaiser, MD; Location: [facility]; Service: Pulmonary; Laterality: Bilateral;  . cholecystectomy 1999  . EGD N/A 07/01/2014  Procedure: EGD; Surgeon: Raghubinder Elenore Paddy, MD; Location: [facility]; Service: Gastroenterology; Laterality: N/A;  . EYE SURGERY Bilateral  cataract  . foot surgery (bunionectomy) - retained hardware Right  . Kyphoplasty T9 07/05/2016  Dr.Kyzen Horn  . left lower lobe thoracotomy 01/1995  . right middle lobectomy 1995  . Ruptured disc surgery in 1986.   Past Family History: Family History   Problem Relation Age of Onset  . Heart disease Mother  . High blood pressure (Hypertension) Mother  . Reflux disease Mother  . Ulcers Mother  . Osteoporosis (Thinning of bones) Mother  . Heart disease Father  . Reflux disease Father  . Ulcers Father  . Pneumonia Sister  . High blood pressure (Hypertension) Sister  . Heart disease Sister  . Heart disease Brother  . Stroke Daughter  . High blood pressure (Hypertension) Son  . Heart disease Sister  . Anesthesia problems Neg Hx  . Malignant hypertension Neg Hx   Medications: Current Outpatient Medications Ordered in Epic  Medication Sig Dispense Refill  . acetaminophen (TYLENOL) 500 MG tablet Take 1,000 mg by mouth. Pt taking every 4-6 hours  . amLODIPine (NORVASC) 5 MG tablet TAKE 1 TABLET (5 MG TOTAL) BY MOUTH ONCE DAILY. 90 tablet 3  . apixaban (ELIQUIS) 5 mg tablet Take 1 tablet (5 mg total) by mouth 2 (two) times daily 180 tablet 3  . ascorbic acid (VITAMIN C) 1000 MG tablet 1 tab by mouth daily  . aspirin 81 MG EC tablet Take 81 mg by mouth once daily  . azithromycin (ZITHROMAX) 250 MG tablet Take by mouth once daily  . bifidobacterium infantis 4 mg capsule 1 tab by mouth daily  . ciprofloxacin HCl (CIPRO) 500 MG tablet Take 500 mg by mouth 2 (two) times daily  . diltiazem (CARDIZEM) 30 MG tablet Take one tablet for atrial fib heart rate above 100. Take another in one hour if no response. 60 tablet 11  . ferrous sulfate 325 (65 FE) MG EC tablet Take 325 mg by mouth daily with breakfast  . gabapentin (NEURONTIN) 100 MG capsule Take 3 capsules (300  mg total) by mouth nightly 270 capsule 3  . hydrocodone-chlorpheniramine (TUSSIONEX) 10-8 mg/5 mL ER suspension Take 5 mLs by mouth every 12 (twelve) hours as needed for Cough  . KLOR-CON 10 10 mEq ER tablet TAKE 1 TABLET BY MOUTH EVERY DAY 30 tablet 5  . LORazepam (ATIVAN) 0.5 MG tablet 1 po q4 prn dyspnea 90 tablet 2  . losartan (COZAAR) 100 MG tablet TAKE 1 TABLET (100 MG TOTAL) BY  MOUTH ONCE DAILY. 90 tablet 3  . metaxalone (SKELAXIN) 800 mg tablet TAKE 1 TABLET BY MOUTH THREE TIMES A DAY 30 tablet 0  . metoprolol succinate (TOPROL-XL) 50 MG XL tablet TAKE 1 TABLET BY MOUTH EVERY DAY 90 tablet 3  . omeprazole (PRILOSEC OTC) 20 MG tablet 2 tab by mouth daily  . OXYGEN-AIR DELIVERY SYSTEMS MISC Use continuously.  . predniSONE (DELTASONE) 10 MG tablet Take 1 tablet (10 mg total) by mouth once daily 6 day taper, 6,5,4,3,2,1. 21 tablet 0  . sodium chloride (HYPERSAL) 7 % nebulizer solution Take 4 mLs by nebulization once daily  . traMADol (ULTRAM) 50 mg tablet TAKE 1 TABLET BY MOUTH EVERY 6 HOURS AS NEEDED FOR MODERATE PAIN 0   No current Epic-ordered facility-administered medications on file.   Allergies: Allergies  Allergen Reactions  . Codeine Nausea And Vomiting  . Lactalbumin Diarrhea  . Penicillins Rash    Review of Systems:  A comprehensive 14 point ROS was performed, reviewed by me today, and the pertinent orthopaedic findings are documented in the HPI.  Exam: There were no vitals taken for this visit. General:  Presents in a wheelchair. Appears to be uncomfortable.  HEENT: Head normocephalic, atraumatic, PERRL.   Abdomen: Soft, non tender, non distended, Bowel sounds present.  Heart: Examination of the heart reveals regular, rate, and rhythm. There is no murmur noted on ascultation. There is a normal apical pulse.  Lungs: Lungs are clear to auscultation. There is no wheeze, rhonchi, or crackles. There is normal expansion of bilateral chest walls.   Thoracic and lumbar spine: Examination of the thoracic and lumbar spine shows patient is tender along the thoracolumbar junction and along the midline of the spinous process with percussion. There is no paravertebral muscle tenderness or swelling. She is nontender along the sacral region, SI joints.  AP and lateral views of thoracic spine are ordered interpreted by me in the office today. Impression:  Methylmethacrylate is present L1, L2, T9. There appears to be 50% compression deformity of T12 that is new when compared to previous x-rays from 12/27/2017. No other definite compression deformities of T11 and T10.   MRI of the thoracic spine reviewed by me today from 01/04/2018 [facility] images show increased signal in the T12 and T11 vertebral bodies indicating acute fractures.   Impression: S/P kyphoplasty [Z98.890] S/P kyphoplasty L1 (primary encounter diagnosis)  Plan:  1. Patient with acute T12 and T11 compression deformity and severe pain. MRI confirms acute compression at T12 and T11. Risks, benefits, complications of Y07 and T11 kyphoplasty were discussed with patient. Patient has agreed and consented procedure with Dr. Rudene Christians.  2. Referral placed to endocrinology and for bone density scan  This note was generated in part with voice recognition software and I apologize for any typographical errors that were not detected and corrected.  Feliberto Gottron MPA-C    Electronically signed by Feliberto Gottron, PA at 01/04/2018 12:39 PM EST     Reviewed paper H+P, will be scanned into chart. No changes noted.

## 2018-01-05 NOTE — Anesthesia Procedure Notes (Signed)
Date/Time: 01/05/2018 4:57 PM Performed by: Nelda Marseille, CRNA Pre-anesthesia Checklist: Patient identified, Emergency Drugs available, Suction available, Patient being monitored and Timeout performed Oxygen Delivery Method: Nasal cannula

## 2018-01-05 NOTE — Op Note (Signed)
Date  01/05/18  Time  5:23 pm   PATIENT: Ana Washington   PRE-OPERATIVE DIAGNOSIS:  closed wedge compression fracture of T11 and T12   POST-OPERATIVE DIAGNOSIS:  closed wedge compression fracture of T11 and T12   PROCEDURE:  Procedure(s): KYPHOPLASTY T11 and T12  SURGEON: Laurene Footman, MD   ASSISTANTS: None   ANESTHESIA:   local and MAC   EBL:  No intake/output data recorded.   BLOOD ADMINISTERED:none   DRAINS: none    LOCAL MEDICATIONS USED:  MARCAINE    and XYLOCAINE    SPECIMEN:   T11 and T12 biopsies vertebral body   DISPOSITION OF SPECIMEN:  Pathology   COUNTS:  YES   TOURNIQUET:  * No tourniquets in log *   IMPLANTS: Bone cement   DICTATION: .Dragon Dictation  patient was brought to the operating room and after adequate anesthesia was obtained the patient was placed prone.  C arm was brought in in good visualization of the affected level obtained on both AP and lateral projections.  After patient identification and timeout procedures were completed, local anesthetic was infiltrated with 10 cc 1% Xylocaine infiltrated subcutaneously.  This is done the area on the right side of the planned approach.  The back was then prepped and draped in the usual sterile manner and repeat timeout procedure carried out.  A spinal needle was brought down to the pedicle on the right side of  T11 and T12 and a 50-50 mix of 1% Xylocaine half percent Sensorcaine with epinephrine total of 20 cc injected.  After allowing this to set a small incision was made and the trocar was advanced into the vertebral body in an extrapedicular fashion.  Biopsy was obtained at each level Drilling was carried out balloon inserted with inflation to  4 cc at T12 and 3 cc at T11.  When the cement was appropriate consistency 3 cc were injected into the T11 vertebral body without extravasation, good fill superior to inferior endplates and from right to left sides along the inferior endplate and similar good fill with  5 cc at T12.  After the cement had set the trochar was removed and permanent C-arm views obtained.  The wound was closed with Dermabond followed by Band-Aid   PLAN OF CARE: Discharge to home after PACU   PATIENT DISPOSITION:  PACU - hemodynamically stable.

## 2018-01-05 NOTE — Transfer of Care (Signed)
Immediate Anesthesia Transfer of Care Note  Patient: Ana Washington  Procedure(s) Performed: Mare Ferrari (N/A Back)  Patient Location: PACU  Anesthesia Type:General  Level of Consciousness: sedated  Airway & Oxygen Therapy: Patient Spontanous Breathing and Patient connected to nasal cannula oxygen  Post-op Assessment: Report given to RN and Post -op Vital signs reviewed and stable  Post vital signs: Reviewed and stable  Last Vitals:  Vitals Value Taken Time  BP 185/114 01/05/2018  5:24 PM  Temp 36.8 C 01/05/2018  5:23 PM  Pulse 114 01/05/2018  5:26 PM  Resp 24 01/05/2018  5:26 PM  SpO2 100 % 01/05/2018  5:26 PM  Vitals shown include unvalidated device data.  Last Pain:  Vitals:   01/05/18 1723  TempSrc:   PainSc: Asleep         Complications: No apparent anesthesia complications

## 2018-01-05 NOTE — Anesthesia Preprocedure Evaluation (Signed)
Anesthesia Evaluation  Patient identified by MRN, date of birth, ID band Patient awake    Reviewed: Allergy & Precautions, NPO status , Patient's Chart, lab work & pertinent test results  History of Anesthesia Complications (+) [name] and history of anesthetic complications  Airway Mallampati: II  TM Distance: >3 FB Neck ROM: Full    Dental no notable dental hx.    Pulmonary neg sleep apnea, COPD,  oxygen dependent,    breath sounds clear to auscultation- rhonchi (-) wheezing      Cardiovascular hypertension, + CAD and + Past MI  (-) Cardiac Stents and (-) CABG + dysrhythmias Atrial Fibrillation  Rhythm:Regular Rate:Normal - Systolic murmurs and - Diastolic murmurs    Neuro/Psych negative neurological ROS  negative psych ROS   GI/Hepatic Neg liver ROS, GERD  ,  Endo/Other  negative endocrine ROSneg diabetes  Renal/GU negative Renal ROS     Musculoskeletal  (+) Arthritis ,   Abdominal (+) - obese,   Peds  Hematology negative hematology ROS (+)   Anesthesia Other Findings Past Medical History: No date: Arthritis No date: Atrial fibrillation (HCC) No date: Bronchiectasis (HCC) No date: CAD (coronary artery disease) 07/30/2017: CAD (coronary artery disease) No date: Dyspnea No date: Dysrhythmia     Comment:  afib 10/03/2013: Essential hypertension, malignant No date: Family history of adverse reaction to anesthesia     Comment:  sister [name] No date: GERD (gastroesophageal reflux disease) No date: Hypertension No date: Lung disease 2007: Myocardial infarction ([place])     Comment:  Non-STEMI No date: [name] (postoperative nausea and vomiting)   Reproductive/Obstetrics                             Anesthesia Physical Anesthesia Plan  ASA: III  Anesthesia Plan: General   Post-op Pain Management:    Induction: Intravenous  [name] Risk Score and Plan: 3 and Propofol infusion  Airway  Management Planned: Natural Airway  Additional Equipment:   Intra-op Plan:   Post-operative Plan:   Informed Consent: I have reviewed the patients History and Physical, chart, labs and discussed the procedure including the risks, benefits and alternatives for the proposed anesthesia with the patient or authorized representative who has indicated his/her understanding and acceptance.   Dental advisory given  Plan Discussed with: CRNA and Anesthesiologist  Anesthesia Plan Comments:         Anesthesia Quick Evaluation

## 2018-01-06 DIAGNOSIS — S22080A Wedge compression fracture of T11-T12 vertebra, initial encounter for closed fracture: Secondary | ICD-10-CM | POA: Diagnosis not present

## 2018-01-06 MED ORDER — TRAMADOL HCL 50 MG PO TABS: 50.0000 mg | ORAL_TABLET | Freq: Four times a day (QID) | ORAL | 1 refills | Status: DC | PRN

## 2018-01-06 NOTE — Progress Notes (Signed)
Patient d/c home, instructions and prescriptions given to patient, verbalized understanding. Family transporting patient home. Patients portable O2 tank placed on patient.

## 2018-01-06 NOTE — Evaluation (Signed)
Physical Therapy Evaluation Patient Details Name: Ana Washington MRN: [id number] DOB: 1945/05/01 Today's Date: 01/06/2018   History of Present Illness  Pt is a 72 y.o. female s/p T11 and T12 kyphoplasty secondary closed wedge compression fx's 01/05/18.  PMH includes L1 kyphoplasty 12/18/17 and h/o L2/L3/T9 kypho, a-fib, htn, and MI.  Clinical Impression  Prior to hospital admission, pt was ambulatory with RW.  Pt lives with her husband in 1 level home with ramp to enter.  Currently pt is modified independent with bed mobility via logrolling (pt has bed-rail at home); CGA to SBA with transfers; and CGA to SBA ambulating 30 feet with RW (decreased cadence noted and limited distance d/t fatigue but steady and safe with functional mobility).  6-7/10 L low back and L hip pain/spasms noted with mobility (nurse notified).  Pt's HR increased from 94 bpm to 122 bpm with activity (decreased to 96 bpm end of session at rest); pt's O2 sats 96% (all on 2 L via nasal cannula) at rest beginning of session and decreased to 90% with activity but returned to 96% with rest in bed.  Pt would benefit from skilled PT to address noted impairments and functional limitations (see below for any additional details).  Upon hospital discharge, recommend pt discharge to home with SBA with functional mobility for safety and HHPT.  Discussed pt's increased assist needs with pt's daughter who verbalized understanding and reported she could help out.    Follow Up Recommendations Home health PT;Supervision for mobility/OOB    Equipment Recommendations  Rolling walker with 5" wheels;3in1 (PT)    Recommendations for Other Services       Precautions / Restrictions Precautions Precautions: Fall;Other (comment) Precaution Comments: spinal precautions s/p kyphoplasty Restrictions Weight Bearing Restrictions: Yes Other Position/Activity Restrictions: WBAT      Mobility  Bed Mobility Overal bed mobility: Modified Independent              General bed mobility comments: Supine to L sidelying to sitting; sitting to L sidelying to supine; pt using bedrail (has own at home); increased time and effort to perform but no physical assist required  Transfers Overall transfer level: Needs assistance Equipment used: Rolling walker (2 wheeled) Transfers: Sit to/from Stand Sit to Stand: Min guard;Supervision         General transfer comment: mild increased effort to stand but steady with RW use; controlled descent sitting noted  Ambulation/Gait Ambulation/Gait assistance: Min Gaffer (Feet): 30 Feet Assistive device: Rolling walker (2 wheeled)   Gait velocity: decreased   General Gait Details: decreased B step length/foot clearance/heelstrike; mild decreased stance time L LE; steady with RW use  Stairs            Wheelchair Mobility    Modified Rankin (Stroke Patients Only)       Balance Overall balance assessment: Needs assistance Sitting-balance support: No upper extremity supported;Feet supported Sitting balance-Leahy Scale: Good Sitting balance - Comments: steady sitting reaching within BOS   Standing balance support: During functional activity Standing balance-Leahy Scale: Good Standing balance comment: steady ambulating with RW                             Pertinent Vitals/Pain Pain Assessment: 0-10 Pain Score: 7  Pain Location: L low back and L hip area Pain Descriptors / Indicators: Spasm;Sore Pain Intervention(s): Limited activity within patient's tolerance;Monitored during session;Premedicated before session;Repositioned;Other (comment)(RN notified of pt's pain levels)  Home Living Family/patient expects to be discharged to:: Private residence Living Arrangements: Spouse/significant other Available Help at Discharge: Family Type of Home: House Home Access: [place]: One [place]: Clinical cytogeneticist - 2  wheels;Walker - 4 wheels;Adaptive equipment      Prior Function Level of Independence: Independent with assistive device(s)         Comments: Ambulatory with RW.     Hand Dominance        Extremity/Trunk Assessment   Upper Extremity Assessment Upper Extremity Assessment: Generalized weakness    Lower Extremity Assessment Lower Extremity Assessment: Generalized weakness(at least 3/5 hip flexion, knee flexion/extension, and DF AROM)    Cervical / Trunk Assessment Cervical / Trunk Assessment: Normal  Communication   Communication: No difficulties  Cognition Arousal/Alertness: Awake/alert Behavior During Therapy: WFL for tasks assessed/performed Overall Cognitive Status: Within Functional Limits for tasks assessed                                        General Comments General comments (skin integrity, edema, etc.): mild red drainage noted from surgical dressing (nurse notified).  Nursing cleared pt for participation in physical therapy.  Pt agreeable to PT session.  Pt's daughter present during session.    Exercises  Reviewed pt's spinal precautions: pt verbalizing understanding   Assessment/Plan    PT Assessment Patient needs continued PT services  PT Problem List Decreased strength;Decreased activity tolerance;Decreased balance;Decreased mobility;Pain       PT Treatment Interventions DME instruction;Gait training;Functional mobility training;Therapeutic activities;Therapeutic exercise;Balance training;Patient/family education    PT Goals (Current goals can be found in the Care Plan section)  Acute Rehab PT Goals Patient Stated Goal: to have less pain PT Goal Formulation: With patient/family Time For Goal Achievement: 01/20/18 Potential to Achieve Goals: Fair    Frequency 7X/week   Barriers to discharge        Co-evaluation               AM-PAC PT "6 Clicks" Mobility  Outcome Measure Help needed turning from your back to your  side while in a flat bed without using bedrails?: A Little Help needed moving from lying on your back to sitting on the side of a flat bed without using bedrails?: A Little Help needed moving to and from a bed to a chair (including a wheelchair)?: A Little Help needed standing up from a chair using your arms (e.g., wheelchair or bedside chair)?: A Little Help needed to walk in hospital room?: A Little Help needed climbing 3-5 steps with a railing? : A Lot 6 Click Score: 17    End of Session Equipment Utilized During Treatment: Gait belt;Oxygen(2 L O2 via nasal cannula) Activity Tolerance: Patient limited by fatigue;Patient limited by pain Patient left: in bed;with call bell/phone within reach;with bed alarm set;with nursing/sitter in room;with family/visitor present;Other (comment)(pt positioned onto R side for comfort; MD Rudene Christians present assessing pt) Nurse Communication: Precautions;Other (comment)(Pt's pain status; HR and O2 sats during session) PT Visit Diagnosis: Other abnormalities of gait and mobility (R26.89);Muscle weakness (generalized) (M62.81);Difficulty in walking, not elsewhere classified (R26.2);Pain Pain - Right/Left: Left Pain - part of body: Hip    Time: 1610-9604 PT Time Calculation (min) (ACUTE ONLY): 33 min   Charges:   PT Evaluation $PT Eval Low Complexity: 1 Low PT Treatments $Therapeutic Activity: 8-22 mins  Leitha Bleak, PT 01/06/18, 10:45 AM [phone]

## 2018-01-06 NOTE — Care Management Note (Signed)
Case Management Note  Patient Details  Name: HAWRAA STAMBAUGH MRN: [id number] Date of Birth: [birth year]  Subjective/Objective:    Patient to be discharged per MD order. Orders in place for home health services. CMS Medicare.gov Compare Post Acute Care list reviewed with patient and daughter. They state they have no preference. Referral placed with Johnney Killian confirmed referral for PT services. Patient has rolling walker in home and states there are no other DME needs.  Ines Bloomer RN BSN RNCM [phone]                   Action/Plan:   Expected Discharge Date:  01/06/18               Expected Discharge Plan:  [facility]  In-House Referral:     Discharge planning Services  CM Consult  Post Acute Care Choice:  Home Health Choice offered to:  Patient, Adult Children  DME Arranged:    DME Agency:     HH Arranged:  PT HH Agency:  [facility]  Status of Service:  Completed, signed off  If discussed at [place] of Stay Meetings, dates discussed:    Additional Comments:  Latanya Maudlin, RN 01/06/2018, 11:18 AM

## 2018-01-06 NOTE — Progress Notes (Addendum)
  Subjective: 1 Day Post-Op Procedure(s) (LRB): KYPHOPLASTY-T11,T12 (N/A) Patient reports pain as moderate.   Patient seen in rounds with Dr. Rudene Christians. Patient is well, but has had some minor complaints of Muscle spasms Plan is to go Home after hospital stay. Negative for chest pain and shortness of breath Fever: 99.4 elevated temperature Gastrointestinal: Negative for nausea and vomiting  Objective: Vital signs in last 24 hours: Temp:  [97.8 F (36.6 C)-99.4 F (37.4 C)] 99.4 F (37.4 C) (12/07 0228) Pulse Rate:  [87-114] 96 (12/07 0228) Resp:  [16-35] 18 (12/07 0228) BP: (120-185)/(81-108) 152/81 (12/07 0228) SpO2:  [95 %-100 %] 99 % (12/07 0228) Weight:  [49.9 kg] 49.9 kg (12/06 1339)  Intake/Output from previous day:  Intake/Output Summary (Last 24 hours) at 01/06/2018 0702 Last data filed at 01/05/2018 1811 Gross per 24 hour  Intake 725 ml  Output -  Net 725 ml    Intake/Output this shift: No intake/output data recorded.  Labs: No results for input(s): HGB in the last 72 hours. No results for input(s): WBC, RBC, HCT, PLT in the last 72 hours. No results for input(s): NA, K, CL, CO2, BUN, CREATININE, GLUCOSE, CALCIUM in the last 72 hours. No results for input(s): LABPT, INR in the last 72 hours.   EXAM General - Patient is Alert and Oriented Extremity - Neurovascular intact Dorsiflexion/Plantar flexion intact Dressing/Incision - clean, dry, no drainage Motor Function - intact, moving foot and toes well on exam.   Past Medical History:  Diagnosis Date  . Arthritis   . Atrial fibrillation ([place])   . Bronchiectasis ([employer])   . CAD (coronary artery disease)   . CAD (coronary artery disease) 07/30/2017  . Dyspnea   . Dysrhythmia    afib  . 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 Family history of adverse reaction to anesthesia    sister [name]  . GERD (gastroesophageal reflux disease)   . Hypertension   . Lung disease   . Myocardial infarction ([place]) 2007   Non-STEMI  . [name] (postoperative nausea and vomiting)     Assessment/Plan: 1 Day Post-Op Procedure(s) (LRB): KYPHOPLASTY-T11,T12 (N/A) Active Problems:   Status post kyphoplasty  Estimated body mass index is 18.3 kg/m as calculated from the following:   Height as of this encounter: 5\' 5"  (1.651 m).   Weight as of this encounter: 49.9 kg. Advance diet Up with therapy  Plan to discharge home today.  DVT Prophylaxis - Aspirin and TED hose  Reche Dixon, PA-C Orthopaedic Surgery 01/06/2018, 7:02 AM    Have escribed medrol dose pack and wrote for tramadol refill D/c to home today

## 2018-01-06 NOTE — Anesthesia Postprocedure Evaluation (Addendum)
Anesthesia Post Note  Patient: Ana Washington  Procedure(s) Performed: [id number],X83 (N/A Back)  Patient location during evaluation: PACU Anesthesia Type: General Level of consciousness: awake and alert and oriented Pain management: pain level controlled Vital Signs Assessment: post-procedure vital signs reviewed and stable Cardiovascular status: blood pressure returned to baseline Anesthetic complications: no     Last Vitals:  Vitals:   01/06/18 0128 01/06/18 0228  BP: (!) 165/94 (!) 152/81  Pulse: 96 96  Resp: 18 18  Temp: 36.7 C 37.4 C  SpO2: 98% 99%    Last Pain:  Vitals:   01/06/18 0228  TempSrc: Oral  PainSc:          Patient had more pain and flank spasm post op and so was admitted overnight for pain control and observation.        Levell Tavano

## 2018-01-08 ENCOUNTER — Encounter: Payer: Self-pay | Admitting: Orthopedic Surgery

## 2018-01-09 LAB — SURGICAL PATHOLOGY

## 2018-01-16 ENCOUNTER — Telehealth: Payer: Self-pay | Admitting: Internal Medicine

## 2018-01-16 DIAGNOSIS — J471 Bronchiectasis with (acute) exacerbation: Secondary | ICD-10-CM

## 2018-01-16 NOTE — Telephone Encounter (Signed)
Please call to discuss medications. States she has has fever of 101.5. she has green sputum. Please call to discuss

## 2018-01-16 NOTE — Telephone Encounter (Signed)
-  Will need a sputum culture if she is able.  -Is she still on levaquin daily? If not then she should 500 mg daily for one week. If already on it, then should start omnicef 300 mg twice daily for one week.

## 2018-01-16 NOTE — Telephone Encounter (Signed)
I spoke to patient and she states that she is feeling better today. She is not sure who called about her this morning. She has no fever and not coughing up anything. She scheduled her 3 month follow-up apt but did not see the need for any culture or antibiotic.

## 2018-02-15 ENCOUNTER — Encounter: Payer: Self-pay | Admitting: Internal Medicine

## 2018-02-15 ENCOUNTER — Ambulatory Visit: Payer: Medicare HMO | Admitting: Internal Medicine

## 2018-02-15 VITALS — BP 140/82 | HR 89 | Resp 16 | Ht 65.0 in | Wt 110.0 lb

## 2018-02-15 DIAGNOSIS — J209 Acute bronchitis, unspecified: Secondary | ICD-10-CM | POA: Diagnosis not present

## 2018-02-15 DIAGNOSIS — J471 Bronchiectasis with (acute) exacerbation: Secondary | ICD-10-CM | POA: Diagnosis not present

## 2018-02-15 DIAGNOSIS — J440 Chronic obstructive pulmonary disease with acute lower respiratory infection: Secondary | ICD-10-CM

## 2018-02-15 NOTE — Patient Instructions (Addendum)
Restart Cipro at previous dose.  Continue azithromycin daily.  Continue to use your vest three times daily as able.

## 2018-02-15 NOTE — Progress Notes (Signed)
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  -Patient currently has evidence of severe bronchiectasis with chronic mucous expectoration which is excessive. -Restart oral Cipro continue azithromycin, percussion vest with hypertonic saline.  - She was prescribed ethambutol and rifampin by her hospice physician, however per charting she was intolerant to these medications due to significant side effects.  Therefore I would suggest that she not take these and stick with her prior regimen.   Chronic hypoxic respiratory failure. Secondary to above. -Continue chronic oxygen therapy. -Patient has severe, end-stage disease and is at high risk of complications hospitalization and death. - remains enrolled in hospice, and will continue to try to do therapies that will minimize her symptoms.   Hiatal hernia. -Continue omeprazole 20 mg once daily  Greater than 50% of the 40 minute visit was spent in counseling/coordination of care regarding end stage MAC.   Return in about 6 months (around 08/16/2018).   Date: 02/15/2018  MRN# [id number] DYNASTY HOLQUIN Apr 16, 1945    Ana Washington is a 73 y.o. old female seen in consultation for chief complaint of:    Chief Complaint  Patient presents with  . Follow-up    pt here fore f/u: she still has hemoptysis and she d/c Eliquis when this happens.  . Shortness of Breath    pt very sob today. She has not had nebs today due to rushing  . Wheezing  . Cough    HPI:   She has a diagnosis of severe, advanced bronchiectasis with history of MAC. She has allergies to multiple MAC medications, therefore she is on an empiric regimen, she has been started on flutter vest. She has done better with the flutter vest, but continues to have quite severe baseline dyspnea. She  home health services via hospice  Since her last visit, her status has remained unchanged,  she continues to have severe dyspnea at baseline. She is doing her percussion vest less often because she had back surgery and this makes it more painful to do the vest. She went through it 3 times, on the last one she ended up in the hospital.  She is now going to a doctor to do a nerve block.  She continues on azithro daily, no longer on cipro. She stopped it as she was taking levaquin for pneumonia but never restarted cipro.   **Chest x-ray imaging, chest x-ray 09/19/2016>> severe diffuse bronchiectasis changes, which are likely chronic.  **CT chest report; 04/23/2012>> chronic changes of chronic bronchitis, bronchiectasis.  Review and summary of outside records: -Bronchoscopy August 2016, MAC susceptibilities performed at [facility] at [place] the susceptibilities are not available, respiratory culture showed gram-negative rods, fungus culture showed Cryptococcus neoformans. --culture with 5-25 colonies of MAC. Sensitivities list resistance to clarithromycin, linezolid, moxifloxacin, and amikacin MIC of 64 (resistant). --She was seen by infectious disease service at [facility] on 10/30/2014, at that time it was recommended that she stop ciprofloxacin, start inhaled amikacin, be enrolled in pulmonary rehabilitation. --Review of records shows pt intolerant of ethambutol and rifampin.   The patient was initially diagnosed with MAC in the early 66s. She underwent anterior segmentectomy and right middle lobectomy in 1991, resection of lingula and basilar left lower lobe in 1996. She has been intolerant of ethambutol and rifampin. She did well for several years, it has been progressively declining since around 12/2013, with particularly fast decline since summer  of 2016.  Social Hx:   Social History   Tobacco Use  . Smoking status: Never Smoker  . Smokeless tobacco: Never Used  Substance Use Topics  . Alcohol use: No  . Drug use: No   Medication:    Current Outpatient  Medications:  .  acetaminophen (TYLENOL) 500 MG tablet, Take 1,000 mg by mouth every 4 (four) hours. , Disp: , Rfl:  .  amLODipine (NORVASC) 5 MG tablet, Take 5 mg by mouth daily. , Disp: , Rfl:  .  apixaban (ELIQUIS) 5 MG TABS tablet, Take 5 mg by mouth daily., Disp: , Rfl:  .  Ascorbic Acid (VITAMIN C) 1000 MG tablet, Take 1,000 mg by mouth daily., Disp: , Rfl:  .  aspirin EC 81 MG tablet, Take 81 mg by mouth daily., Disp: , Rfl:  .  azithromycin (ZITHROMAX) 250 MG tablet, Take 250 mg by mouth daily. continuous, Disp: , Rfl:  .  Calcium Carbonate-Vitamin D (OYSTER SHELL/VITAMIN D) 600-125 MG-UNIT TABS, Take 1 tablet by mouth daily., Disp: , Rfl:  .  chlorpheniramine-HYDROcodone (TUSSIONEX) 10-8 MG/5ML SUER, Take 6 mLs by mouth at bedtime., Disp: , Rfl:  .  Cholecalciferol (VITAMIN D-1000 MAX ST) 1000 UNITS tablet, Take 1,000 Units by mouth daily. , Disp: , Rfl:  .  diltiazem (CARDIZEM) 30 MG tablet, Take 30 mg by mouth as needed (A-fib). Takes 1 tablet for heart rate over 100 and 2nd tablet 1 hour later if needed, Disp: , Rfl:  .  ferrous sulfate 325 (65 FE) MG EC tablet, Take 325 mg by mouth daily. , Disp: , Rfl:  .  gabapentin (NEURONTIN) 100 MG capsule, Take 300 mg by mouth at bedtime., Disp: , Rfl:  .  levofloxacin (LEVAQUIN) 500 MG tablet, Take 1 tablet (500 mg total) by mouth daily., Disp: 7 tablet, Rfl: 0 .  LORazepam (ATIVAN) 0.5 MG tablet, Take 0.5 mg by mouth every 4 (four) hours. 0.5 mg every 4 hours, Disp: , Rfl:  .  losartan (COZAAR) 100 MG tablet, Take 100 mg by mouth daily. take 1 tablet by mouth once daily, Disp: , Rfl:  .  metaxalone (SKELAXIN) 800 MG tablet, Take 800 mg by mouth 3 (three) times daily. , Disp: , Rfl:  .  metoprolol succinate (TOPROL-XL) 50 MG 24 hr tablet, Take 50 mg by mouth at bedtime. , Disp: , Rfl:  .  morphine (ROXANOL) 20 MG/ML concentrated solution, Take by mouth at bedtime as needed for severe pain (0.25 ml at bedtime as needed)., Disp: , Rfl:  .   Multiple Vitamin (MULTIVITAMIN WITH MINERALS) TABS tablet, Take 1 tablet by mouth daily. Women's 50+, Disp: , Rfl:  .  omeprazole (PRILOSEC OTC) 20 MG tablet, Take 40 mg by mouth daily. TAKES 2 TABLETS, Disp: , Rfl:  .  potassium chloride (K-DUR) 10 MEQ tablet, Take 10 mEq by mouth daily., Disp: , Rfl:  .  predniSONE (DELTASONE) 10 MG tablet, Take 1 tablet (10 mg total) by mouth daily with breakfast., Disp: 30 tablet, Rfl: 6 .  Probiotic Product (ALIGN) 4 MG CAPS, Take 4 mg by mouth daily., Disp: , Rfl:  .  sodium chloride 0.9 % nebulizer solution, Take 3 mLs by nebulization 2 (two) times daily as needed for wheezing. Wheezing , Disp: , Rfl: 0 .  traMADol (ULTRAM) 50 MG tablet, Take 1 tablet (50 mg total) by mouth every 6 (six) hours as needed for moderate pain., Disp: 30 tablet, Rfl: 0 .  traMADol (ULTRAM) 50 MG  tablet, Take 1 tablet (50 mg total) by mouth every 6 (six) hours as needed., Disp: 50 tablet, Rfl: 1   Allergies:  Codeine and Penicillins  Review of Systems  Constitutional: Positive for chills. Negative for fever and weight loss.  HENT: Negative for ear pain, hearing loss and tinnitus.   Eyes: Negative for blurred vision, double vision and photophobia.  Respiratory: Positive for cough and sputum production. Negative for hemoptysis.   Cardiovascular: Negative for chest pain, palpitations and orthopnea.  Gastrointestinal: Negative for heartburn and nausea.  Genitourinary: Negative for dysuria and urgency.  Musculoskeletal: Negative for myalgias.  Skin: Negative for itching and rash.  Neurological: Negative for dizziness and headaches.  Endo/Heme/Allergies: Negative for polydipsia. Does not bruise/bleed easily.    Physical Exam  Constitutional: She is oriented to person, place, and time.  HENT:  Head: Normocephalic and atraumatic.  Eyes: Pupils are equal, round, and reactive to light. EOM are normal.  Neck: Normal range of motion. Neck supple.  Cardiovascular: Normal rate and  normal heart sounds.  Pulmonary/Chest: She is in respiratory distress. She has wheezes. She has rales. She exhibits no tenderness.  Abdominal: She exhibits no distension. There is abdominal tenderness.  Musculoskeletal: Normal range of motion.        General: No deformity or edema.  Neurological: She is alert and oriented to person, place, and time.  Skin: Skin is warm. She is diaphoretic.     LABORATORY PANEL:   CBC No results for input(s): WBC, HGB, HCT, PLT in the last 168 hours. ------------------------------------------------------------------------------------------------------------------  Chemistries  No results for input(s): NA, K, CL, CO2, GLUCOSE, BUN, CREATININE, CALCIUM, MG, AST, ALT, ALKPHOS, BILITOT in the last 168 hours.  Invalid input(s): GFRCGP ------------------------------------------------------------------------------------------------------------------  Cardiac Enzymes No results for input(s): TROPONINI in the last 168 hours. ------------------------------------------------------------  RADIOLOGY:  No results found.     Thank  you for the consultation and for allowing [facility] to assist in the care of your patient. Our recommendations are noted above.  Please contact us if we can be of further service.  Marda Stalker, M.D., F.C.C.P.  Board Certified in Internal Medicine, Pulmonary Medicine, [place], and Sleep Medicine.  [facility] Office Number: [phone]  02/15/2018

## 2018-02-28 ENCOUNTER — Other Ambulatory Visit: Payer: Self-pay | Admitting: Internal Medicine

## 2018-03-01 ENCOUNTER — Ambulatory Visit: Payer: Medicare HMO | Admitting: Nurse Practitioner

## 2018-03-01 ENCOUNTER — Encounter: Payer: Self-pay | Admitting: Nurse Practitioner

## 2018-03-01 ENCOUNTER — Ambulatory Visit: Payer: Medicare Other | Attending: Nurse Practitioner | Admitting: Nurse Practitioner

## 2018-03-01 ENCOUNTER — Other Ambulatory Visit: Payer: Self-pay

## 2018-03-01 DIAGNOSIS — G8929 Other chronic pain: Secondary | ICD-10-CM | POA: Diagnosis present

## 2018-03-01 DIAGNOSIS — M549 Dorsalgia, unspecified: Secondary | ICD-10-CM | POA: Diagnosis present

## 2018-03-01 DIAGNOSIS — M899 Disorder of bone, unspecified: Secondary | ICD-10-CM | POA: Insufficient documentation

## 2018-03-01 DIAGNOSIS — Z79899 Other long term (current) drug therapy: Secondary | ICD-10-CM | POA: Diagnosis present

## 2018-03-01 DIAGNOSIS — K559 Vascular disorder of intestine, unspecified: Secondary | ICD-10-CM | POA: Insufficient documentation

## 2018-03-01 DIAGNOSIS — A310 Pulmonary mycobacterial infection: Secondary | ICD-10-CM | POA: Insufficient documentation

## 2018-03-01 DIAGNOSIS — M545 Low back pain, unspecified: Secondary | ICD-10-CM

## 2018-03-01 DIAGNOSIS — Z789 Other specified health status: Secondary | ICD-10-CM | POA: Diagnosis present

## 2018-03-01 DIAGNOSIS — G43909 Migraine, unspecified, not intractable, without status migrainosus: Secondary | ICD-10-CM | POA: Insufficient documentation

## 2018-03-01 DIAGNOSIS — Z79891 Long term (current) use of opiate analgesic: Secondary | ICD-10-CM | POA: Insufficient documentation

## 2018-03-01 DIAGNOSIS — K5792 Diverticulitis of intestine, part unspecified, without perforation or abscess without bleeding: Secondary | ICD-10-CM | POA: Insufficient documentation

## 2018-03-01 DIAGNOSIS — G894 Chronic pain syndrome: Secondary | ICD-10-CM | POA: Diagnosis present

## 2018-03-01 DIAGNOSIS — M810 Age-related osteoporosis without current pathological fracture: Secondary | ICD-10-CM | POA: Insufficient documentation

## 2018-03-01 DIAGNOSIS — J479 Bronchiectasis, uncomplicated: Secondary | ICD-10-CM | POA: Insufficient documentation

## 2018-03-01 DIAGNOSIS — L409 Psoriasis, unspecified: Secondary | ICD-10-CM | POA: Insufficient documentation

## 2018-03-01 NOTE — Patient Instructions (Signed)

## 2018-03-01 NOTE — Progress Notes (Signed)
Patient's Name: Ana Washington  MRN: [id number]  Referring Provider: Renata Caprice  DOB: 04-12-1945  PCP: [name], MD  DOS: 03/01/2018  Note by: Dionisio David NP  Service setting: Ambulatory outpatient  Specialty: Interventional Pain Management  Location: [facility] (AMB) Pain Management Facility    Patient type: New Patient    Primary Reason(s) for Visit: Initial Patient Evaluation CC: Back Pain  HPI  Ana Washington is a 73 y.o. year old, female patient, who comes today for an initial evaluation. 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 Her primarily concern today is the Back Pain  Pain Assessment: Location: Mid, Lower Back Radiating: pain moves around to my stomach Onset: More than a month ago Duration: Chronic pain Quality: Burning, Aching Severity: 5 /10 (subjective, self-reported pain score)  Note: Reported level is compatible with observation. Clinically the patient looks like a 2/10              Effect on ADL:  unable to do anything Timing: Intermittent Modifying factors: heat, medications, rest BP: 121/72  HR: 75  Onset and Duration: Gradual and Present longer than 3 months Cause of pain: Unknown Severity: Getting better, NAS-11 at its worse: 10/10, NAS-11 at its best: 2/10, NAS-11 now: 5/10 and NAS-11 on the average: 5/10 Timing: Morning, Not influenced by the time of the day, During activity or exercise, After activity or exercise and After a period of immobility Aggravating Factors: Lifiting, Motion, Prolonged standing, Surgery made it worse and Walking Alleviating Factors: Hot packs, Lying down, Medications and Resting Associated Problems: Spasms and Pain that wakes patient up Quality of Pain: Disabling, Dull, Hot, Sharp, Shooting, Tingling and Uncomfortable Previous Examinations or Tests: X-rays Previous Treatments: None  The patient comes into the clinics today for the first time for a chronic pain management evaluation.  According to the patient her primary area of pain is in her middle back.  She is status post kyphoplasty x3 for spontaneous compression fractures related to osteoporosis.  She admits that she is having some relief.  She has had a recent MRI.  Second area of pain is in her lower back.  She is status post laminectomy.  She denies any pain from this area.  She has had a recent MRI.  She admits that she was having some leg spasms however that has resolved.  She still is unable to walk without a walker.  She is currently under hospice care for mycobacteria avium complex and bronchiectasis.  Today I took the time to provide the patient with information regarding this pain practice. The patient was informed that the practice is divided into two sections: an interventional pain management section, as well as a completely separate and distinct medication management section. I explained that there are procedure days for interventional therapies, and evaluation days for follow-ups  and  medication management. Because of the amount of documentation required during both, they are kept separated. This means that there is the possibility that she may be scheduled for a procedure on one day, and medication management the next. I have also informed her that because of staffing and facility limitations, this practice will no longer take patients for medication management only. To illustrate the reasons for this, I gave the patient the example of surgeons, and how inappropriate it would be to refer a patient to his/her care, just to write for the post-surgical antibiotics on a surgery done by a different surgeon.   Because interventional pain management is part of the board-certified specialty for the doctors, the patient was informed that joining this practice means that they are open to any and all interventional therapies. I made it clear that this does not mean that they will be forced to have any procedures done. What this means is that I believe interventional therapies to be essential part of the diagnosis and proper management of chronic pain conditions. Therefore, patients not interested in these interventional alternatives will be better served under the care of a different practitioner.  The patient was also made aware of my Comprehensive Pain Management Safety Guidelines where by joining this practice, they limit all of their nerve blocks and joint injections to those done by our practice, for as long as we are retained to manage their care. Historic Controlled Substance Pharmacotherapy Review  PMP and historical list of controlled substances: Lorazepam 1 mg, tramadol 50 mg, hydrocodone/Chlorphen ER suspension, hydrocodone/acetaminophen 5/325 mg, lorazepam 0.5 mg, morphine sulfate 100 mg per 5 mL's, guaifenesin AC cough syrup Highest opioid analgesic regimen found: Morphine sulfate 100 mg per 5 mL 10 mL 4 times daily (last fill date 09/09/2017) morphine sulfate 40 mg/day Most recent  opioid analgesic: Tramadol 50 mg 1 tablet 4 times daily (fill date was 01/26/2018) tramadol 200 mg/day Current opioid analgesics:Tramadol 50 mg 1 tablet 4 times daily (fill date was 01/26/2018) tramadol 200 mg/day Highest recorded MME/day: 40 mg/day MME/day: 20 mg/day Medications: The patient did not bring the medication(s) to the appointment, as requested in our "New Patient Package" Pharmacodynamics: Desired effects: Analgesia: The patient reports >50% benefit. Reported improvement in function: The patient reports medication allows her to accomplish basic ADLs. Clinically meaningful improvement in function (CMIF): Sustained CMIF goals met Perceived effectiveness: Described as relatively effective, allowing for increase in activities of daily living (ADL) Undesirable effects: Side-effects or Adverse reactions: None reported Historical Monitoring: The patient  reports no history of drug use. List of all UDS Test(s): No results found for: MDMA, COCAINSCRNUR, PCPSCRNUR, PCPQUANT, CANNABQUANT, THCU, [place] List of all Serum Drug Screening Test(s):  No results found for: AMPHSCRSER, BARBSCRSER, BENZOSCRSER, COCAINSCRSER, PCPSCRSER, PCPQUANT, THCSCRSER, CANNABQUANT, OPIATESCRSER, OXYSCRSER, PROPOXSCRSER Historical Background Evaluation: [place] PDMP: Six (6) year initial data search conducted.             [place] Department of public safety, offender search: Editor, commissioning Information) Non-contributory Risk Assessment Profile: Aberrant behavior: None observed or detected today Risk factors for fatal opioid overdose: None identified today Fatal overdose hazard ratio (HR): Calculation deferred Non-fatal overdose hazard ratio (HR): Calculation deferred Risk of opioid abuse or dependence: 0.7-3.0% with doses ? 36 MME/day and 6.1-26% with doses ? 120 MME/day. Substance use disorder (SUD) risk level: Pending results of Medical Psychology Evaluation for SUD Opioid risk tool (ORT) (Total Score): 0  ORT Scoring  interpretation table:  Score <3 = Low Risk for SUD  Score between 4-7 = Moderate Risk for SUD  Score >8 = High Risk for Opioid Abuse   PHQ-2 Depression Scale:  Total score:    PHQ-2 Scoring interpretation table: (Score and probability of major depressive disorder)  Score 0 = No depression  Score 1 = 15.4% Probability  Score 2 = 21.1% Probability  Score 3 = 38.4% Probability  Score 4 = 45.5% Probability  Score 5 = 56.4% Probability  Score 6 = 78.6% Probability   PHQ-9 Depression Scale:  Total score:    PHQ-9 Scoring interpretation table:  Score 0-4 = No depression  Score 5-9 = Mild depression  Score 10-14 = Moderate depression  Score 15-19 = Moderately severe depression  Score 20-27 = Severe depression (2.4 times higher risk of SUD and 2.89 times higher risk of overuse)   Pharmacologic Plan: Pending ordered tests and/or consults  Meds  The patient has a current medication list which includes the following prescription(s): acetaminophen, amlodipine, apixaban, vitamin c, aspirin ec, azithromycin, oyster shell/vitamin d, chlorpheniramine-hydrocodone, cholecalciferol, ciprofloxacin, diltiazem, ferrous sulfate, gabapentin, lorazepam, losartan, morphine, multivitamin with minerals, omeprazole, potassium chloride, prednisone, align, sodium chloride, tramadol, metaxalone, and metoprolol succinate.  Current Outpatient Medications on File Prior to Visit  Medication Sig  . acetaminophen (TYLENOL) 500 MG tablet Take 1,000 mg by mouth every 4 (four) hours.   [name] amLODipine (NORVASC) 5 MG tablet Take 5 mg by mouth daily.   [name] apixaban (ELIQUIS) 5 MG TABS tablet Take 5 mg by mouth daily.  . Ascorbic Acid (VITAMIN C) 1000 MG tablet Take 1,000 mg by mouth daily.  [name] aspirin EC 81 MG tablet Take 81 mg by mouth daily.  [name] azithromycin (ZITHROMAX) 250 MG tablet Take 250 mg by mouth daily. continuous  . Calcium Carbonate-Vitamin D (OYSTER SHELL/VITAMIN D) 600-125 MG-UNIT TABS Take 1 tablet by mouth daily.   . chlorpheniramine-HYDROcodone (TUSSIONEX) 10-8 MG/5ML SUER Take 6 mLs by mouth at bedtime.  . Cholecalciferol (VITAMIN D-1000 MAX ST) 1000 UNITS tablet Take 1,000 Units by mouth daily.   . ciprofloxacin (CIPRO) 500 MG tablet Take 500 mg by mouth 2 (two) times daily.  [name] diltiazem (CARDIZEM) 30 MG tablet Take 120 mg by mouth 1 day or 1 dose. Takes 1 tablet for heart rate over 100 and 2nd tablet 1 hour later if needed  . ferrous sulfate 325 (65 FE) MG EC tablet Take 325 mg by mouth daily.   [name] gabapentin (NEURONTIN) 100 MG capsule Take 300 mg by mouth at bedtime.  [name] LORazepam (ATIVAN) 0.5 MG tablet Take 0.5 mg by mouth every 4 (four) hours. 0.5 mg every 4 hours  . losartan (COZAAR) 100 MG tablet Take 100 mg by mouth daily. take 1 tablet by mouth once daily  . morphine (ROXANOL) 20 MG/ML concentrated solution Take by mouth at bedtime as needed for severe pain (0.25 ml at bedtime as needed).  . Multiple Vitamin (MULTIVITAMIN WITH MINERALS) TABS tablet Take 1 tablet by mouth daily. Women's 50+  . omeprazole (PRILOSEC OTC) 20 MG tablet Take 40 mg by mouth daily. TAKES 2 TABLETS  . potassium chloride (K-DUR) 10 MEQ tablet Take 10 mEq by mouth daily.  . predniSONE (DELTASONE) 10 MG tablet TAKE 1 TABLET(10 MG) BY MOUTH DAILY WITH BREAKFAST  . Probiotic Product (ALIGN) 4 MG CAPS Take 4 mg by mouth daily.  . sodium chloride 0.9 % nebulizer solution Take 3 mLs by nebulization 2 (two) times daily as needed for wheezing. Wheezing   . traMADol (ULTRAM) 50 MG  tablet Take 1 tablet (50 mg total) by mouth every 6 (six) hours as needed.  . metaxalone (SKELAXIN) 800 MG tablet Take 800 mg by mouth 3 (three) times daily.   . metoprolol succinate (TOPROL-XL) 50 MG 24 hr tablet Take 50 mg by mouth at bedtime.    No current facility-administered medications on file prior to visit.    Imaging Review  Thoracic Imaging: Thoracic MR wo contrast:  Results for orders placed during the hospital encounter of 01/04/18  MR  THORACIC SPINE WO CONTRAST   Narrative CLINICAL DATA:  Severe mid to low back pain and difficulty walking. History of previous compression fractures.  EXAM: MRI THORACIC SPINE WITHOUT CONTRAST  TECHNIQUE: Multiplanar, multisequence MR imaging of the thoracic spine was performed. No intravenous contrast was administered.  COMPARISON:  None.  FINDINGS: Alignment:  Normal  Vertebrae: Old previously augmented fractures at T9, L1 and L2. Methylmethacrylate distribution appears good. There is an old healed minimal superior endplate fracture at T5 with loss of height of only 10%. There is an inferior endplate fracture at S82 with loss of height of 10%. There is mild edema indicating that this is acute or subacute. This could be the cause of the patient's symptoms.  Cord:  Normal  Paraspinal and other soft tissues: Normal  Disc levels:  No significant degenerative changes. Canal and foramina widely patent.  IMPRESSION: Inferior endplate fracture at L07 with mild edema. Loss of height of about 10%. This is acute or subacute and could be symptomatic.  Old previously augmented fractures at T9, L1 and L2.  Old minor superior endplate fracture at T5 without augmentation.   Electronically Signed   By: Nelson Chimes M.D.   On: 01/04/2018 08:43    Results for orders placed during the hospital encounter of 01/05/18  DG Thoracic Spine 2 View   Narrative CLINICAL DATA:  Kyphoplasty.  EXAM: DG C-ARM 61-120 MIN; THORACIC SPINE 2 VIEWS  FLUOROSCOPY TIME:  2 minutes and 20 seconds.  COMPARISON:  None.  FINDINGS: Fourteen fluoroscopic spot views of the lower thoracic spine demonstrating T12 and T11 vertebral body cement augmentation. Status post L1 cement augmentation.  IMPRESSION: Fluoroscopic imaging of T11 and T12 vertebral body cement augmentation.   Electronically Signed   By: Elon Alas M.D.   On: 01/05/2018 17:58   Lumbosacral Imaging: Lumbar MR wo  contrast:  Results for orders placed during the hospital encounter of 12/15/17  MR LUMBAR SPINE WO CONTRAST   Narrative CLINICAL DATA:  Low back pain.  Recent kyphoplasty.  EXAM: MRI LUMBAR SPINE WITHOUT CONTRAST  TECHNIQUE: Multiplanar, multisequence MR imaging of the lumbar spine was performed. No intravenous contrast was administered.  COMPARISON:  MR 11/25/2017.  Intraoperative radiographs 11/29/2017.  FINDINGS: Segmentation:  Standard  Alignment: 4 mm anterolisthesis L4-5, facet mediated. 1-2 mm anterolisthesis L3-4, facet mediated.  Vertebrae: Recent vertebral augmentation L2 and L3. New linear acute bone marrow edema L1, minor endplate depression, without significant wedging. No retropulsion. Tiny anterior inferior L1 corner fracture appears nondisplaced, see series 7, image 7. Remote compression deformities L4 and L5. Hemangioma L1.  Conus medullaris and cauda equina: Conus extends to the L1 level. Conus and cauda equina appear normal.  Paraspinal and other soft tissues: Unchanged LEFT renal cyst.  Disc levels:  L1-L2: Annular bulging accompanies retropulsion of L2. Facet arthropathy. Mild stenosis, not significantly increased from priors.  L2-L3: Annular bulge, minimal retropulsion of L3, mild facet arthropathy. Mild stenosis, no impingement.  L3-L4: Remote compression fracture of  L4. Annular bulging. 1-2 mm retrolisthesis. Posterior element hypertrophy. Mild stenosis. No impingement.  L4-L5: 4 mm anterolisthesis. Uncovering of the disc with annular bulge. Remote compression fracture L5, minimal retropulsion. Facet arthropathy and ligamentum flavum hypertrophy. Mild to moderate stenosis. BILATERAL L5 neural impingement is possible.  L5-S1:  Annular bulge.  Facet arthropathy.  No impingement.  IMPRESSION: Minor compression fracture L1, minimal endplate deformity with acute bone marrow edema along the inferior aspect, not present 11/25/2017. No  retropulsion.  Status post vertebral augmentation L2 and L3, with no adverse features.  Stable multilevel spondylosis, with mild stenosis L4-5.   Electronically Signed   By: Staci Righter M.D.   On: 12/15/2017 10:02    Results for orders placed during the hospital encounter of 12/18/17  DG Lumbar Spine 2-3 Views   Narrative CLINICAL DATA:  Painful L1 vertebral body fracture.  EXAM: LUMBAR SPINE - 2-3 VIEW; DG C-ARM 61-120 MIN  COMPARISON:  MRI lumbar spine 12/15/2017.  FINDINGS: L1 kyphoplasty has been performed. Satisfactory central position of methylmethacrylate. Previous L2 and L3 vertebral augmentation are unchanged.  IMPRESSION: Status post L1 kyphoplasty.  No adverse features.   Electronically Signed   By: Staci Righter M.D.   On: 12/18/2017 16:53   Hip Imaging:  Hip-R DG 2-3 views:  Results for orders placed during the hospital encounter of 07/30/17  DG Hip Unilat W or Wo Pelvis 2-3 Views Right   Narrative CLINICAL DATA:  Ground level fall  EXAM: DG HIP (WITH OR WITHOUT PELVIS) 2-3V RIGHT  COMPARISON:  None.  FINDINGS: SI joint degenerative changes. Probable acute minimally displaced fracture involving the right superior pubic ramus. Questionable nondisplaced right inferior pubic ramus fracture on the lateral view of the right hip. Right femoral head demonstrates normal alignment  IMPRESSION: 1. Suspected acute fractures of the right superior and inferior pubic rami.   Electronically Signed   By: Donavan Foil M.D.   On: 07/30/2017 22:46   Knee Imaging:  Knee-L DG 4 views:  Results for orders placed during the hospital encounter of 12/05/13  DG Knee Complete 4 Views Left   Narrative CLINICAL DATA:  Left posterior knee pain, no trauma  EXAM: LEFT KNEE - COMPLETE 4+ VIEW  COMPARISON:  None.  FINDINGS: Mild to moderate tricompartmental degenerative changes, severe in the medial compartment.  Medial and lateral compartment  chondrocalcinosis.  No suprapatellar knee joint effusion.  No fracture or dislocation is seen.  IMPRESSION: Mild to moderate degenerative changes with chondrocalcinosis.   Electronically Signed   By: Julian Hy M.D.   On: 12/05/2013 15:29   Note: Available results from prior imaging studies were reviewed.        ROS  Cardiovascular History: Daily Aspirin intake, High blood pressure, Heart attack ( Date: 2015), Heart catheterization and Blood thinners:  Antiplatelet Pulmonary or Respiratory History: Lung problems and Shortness of breath Neurological History: No reported neurological signs or symptoms such as seizures, abnormal skin sensations, urinary and/or fecal incontinence, being born with an abnormal open spine and/or a tethered spinal cord Review of Past Neurological Studies: No results found for this or any previous visit. Psychological-Psychiatric History: No reported psychological or psychiatric signs or symptoms such as difficulty sleeping, anxiety, depression, delusions or hallucinations (schizophrenial), mood swings (bipolar disorders) or suicidal ideations or attempts Gastrointestinal History: Heartburn due to stomach pushing into lungs (Hiatal hernia) and Reflux or heatburn Genitourinary History: No reported renal or genitourinary signs or symptoms such as difficulty voiding or producing urine, peeing blood,  non-functioning kidney, kidney stones, difficulty emptying the bladder, difficulty controlling the flow of urine, or chronic kidney disease Hematological History: No reported hematological signs or symptoms such as prolonged bleeding, low or poor functioning platelets, bruising or bleeding easily, hereditary bleeding problems, low energy levels due to low hemoglobin or being anemic Endocrine History: No reported endocrine signs or symptoms such as high or low blood sugar, rapid heart rate due to high thyroid levels, obesity or weight gain due to slow thyroid or  thyroid disease Rheumatologic History: No reported rheumatological signs and symptoms such as fatigue, joint pain, tenderness, swelling, redness, heat, stiffness, decreased range of motion, with or without associated rash Musculoskeletal History: Negative for myasthenia gravis, muscular dystrophy, multiple sclerosis or malignant hyperthermia Work History: Retired  Allergies  Ms. Gentry is allergic to sulfa antibiotics; codeine; and penicillins.  Laboratory Chemistry  Inflammation Markers No results found for: CRP, ESRSEDRATE (CRP: Acute Phase) (ESR: Chronic Phase) Renal Function Markers Lab Results  Component Value Date   BUN 15 11/27/2017   CREATININE 0.78 11/27/2017   GFRAA >60 11/27/2017   GFRNONAA >60 11/27/2017   Hepatic Function Markers No results found for: AST, ALT, ALBUMIN, ALKPHOS, HCVAB Electrolytes Lab Results  Component Value Date   NA 134 (L) 11/27/2017   K 3.7 11/27/2017   CL 92 (L) 11/27/2017   CALCIUM 9.6 11/27/2017   Neuropathy Markers No results found for: [id number] Bone Pathology Markers Lab Results  Component Value Date   CALCIUM 9.6 11/27/2017   Coagulation Parameters Lab Results  Component Value Date   PLT 429 (H) 11/27/2017   Cardiovascular Markers Lab Results  Component Value Date   HGB 12.3 11/27/2017   HCT 38.4 11/27/2017   Note: Lab results reviewed.  [place]  Drug: Ms. Gruber  reports no history of drug use. Alcohol:  reports no history of alcohol use. Tobacco:  reports that she has never smoked. She has never used smokeless tobacco. 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 Family: family history includes Hypertension in her father and mother.  Past Surgical History:  Procedure Laterality Date  . BACK SURGERY    . CHOLECYSTECTOMY    . FOOT SURGERY    . KYPHOPLASTY N/A 07/05/2016   Procedure: KYPHOPLASTY T - 9;  Surgeon: Hessie Knows, MD;  Location: [facility] ORS;  Service: Orthopedics;  Laterality: N/A;  . KYPHOPLASTY N/A 11/29/2017   Procedure: Iona Hansen;  Surgeon: Hessie Knows, MD;  Location: [facility] ORS;  Service: Orthopedics;  Laterality: N/A;  L2 and L3  . KYPHOPLASTY N/A 12/18/2017   Procedure: KYPHOPLASTY L1;  Surgeon: Hessie Knows, MD;  Location: [facility] ORS;  Service: Orthopedics;  Laterality: N/A;  . KYPHOPLASTY N/A 01/05/2018   Procedure: KYPHOPLASTY-T11,T12;  Surgeon: Hessie Knows, MD;  Location: [facility] ORS;  Service: Orthopedics;  Laterality: N/A;  . LUNG SURGERY  1990 and 1996   Active Ambulatory Problems    Diagnosis Date Noted  . Essential hypertension 10/03/2013  . Primary osteoarthritis of both knees 10/03/2013  . GERD (gastroesophageal reflux disease) 07/30/2017  . Pelvic fracture ([place]) 07/30/2017  . Paroxysmal atrial fibrillation ([place]) 07/30/2017  . Coronary artery disease involving native coronary artery of native heart without angina pectoris 07/28/2012  . Protein-calorie malnutrition, severe 08/03/2017  .  Status post kyphoplasty 01/05/2018  . Adult bronchiectasis ([place]) 03/01/2018  . Compression fracture 03/01/2018  . DDD (degenerative disc disease), lumbar 02/29/2012  . Diverticulitis 03/01/2018  . Hyperlipidemia, mixed 07/28/2012  . 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[place]) 03/01/2018  . Migraines 03/01/2018  . 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 Non-ischemic cardiomyopathy ([place]) 09/12/2013  . 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 Other dysphagia 09/13/2016  . Primary localized osteoarthrosis, lower leg 10/03/2013  . Psoriasis 03/01/2018  . Unintended weight loss 09/13/2016   . Chronic upper back pain (Primary Area of Pain) (R>L) 03/01/2018  . Chronic bilateral low back pain without sciatica (Secondary Area of Pain) (R>L) 03/01/2018  . Chronic pain syndrome 03/01/2018  . Long term current use of opiate analgesic 03/01/2018  . Pharmacologic therapy 03/01/2018  . Disorder of skeletal system 03/01/2018  . Problems influencing health status 03/01/2018   Resolved Ambulatory Problems    Diagnosis Date Noted  . No Resolved Ambulatory Problems   Past Medical History:  Diagnosis Date  . Arthritis   . Atrial fibrillation ([place])   . Bronchiectasis ([place])   . CAD (coronary artery disease)   . CAD (coronary artery disease) 07/30/2017  . Dyspnea   . Dysrhythmia   . 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 Family history of adverse reaction to anesthesia   . Hypertension   . Lung disease   . Myocardial infarction ([place]) 2007  . [name] (postoperative nausea and vomiting)    Constitutional Exam  General appearance: Well nourished, well developed, and well hydrated. In no apparent acute distress Vitals:   03/01/18 1303  BP: 121/72  Pulse: 75  Temp: 97.7 F (36.5 C)  SpO2: 98%  Weight: 104 lb (47.2 kg)  Height: '5\' 5"'$  (1.651 m)   BMI Assessment: Estimated body mass index is 17.31 kg/m as calculated from the following:   Height as of this encounter: '5\' 5"'$  (1.651 m).   Weight as of this encounter: 104 lb (47.2 kg).  BMI interpretation table: BMI level Category Range association with higher incidence of chronic pain  <18 kg/m2 Underweight   18.5-24.9 kg/m2 Ideal body weight   25-29.9 kg/m2 Overweight Increased incidence by 20%  30-34.9 kg/m2 Obese (Class I) Increased incidence by 68%  35-39.9 kg/m2 Severe obesity (Class II) Increased incidence by 136%  >40 kg/m2 Extreme obesity (Class III) Increased incidence by 254%   BMI Readings from Last 4 Encounters:  03/01/18 17.31 kg/m  02/15/18 18.30 kg/m  01/05/18 18.30 kg/m  11/29/17 18.70 kg/m   Wt  Readings from Last 4 Encounters:  03/01/18 104 lb (47.2 kg)  02/15/18 110 lb (49.9 kg)  01/05/18 110 lb (49.9 kg)  11/29/17 112 lb 6.4 oz (51 kg)  Psych/Mental status: Alert, oriented x 3 (person, place, & time)       Eyes: PERLA Respiratory: Oxygen-dependent  Cervical Spine Exam  Inspection: No masses, redness, or swelling Alignment: Symmetrical Functional ROM: Unrestricted ROM      Stability: No instability detected Muscle strength & Tone: Functionally intact Sensory: Unimpaired Palpation: No palpable anomalies              Upper Extremity (UE) Exam    Side: Right upper extremity  Side: Left upper extremity  Inspection: No masses, redness, swelling, or asymmetry. No contractures  Inspection: No masses, redness, swelling, or asymmetry. No contractures  Functional ROM: Unrestricted ROM          Functional ROM: Unrestricted ROM          Muscle strength &  Tone: Functionally intact  Muscle strength & Tone: Functionally intact  Sensory: Unimpaired  Sensory: Unimpaired  Palpation: No palpable anomalies              Palpation: No palpable anomalies              Specialized Test(s): Deferred         Specialized Test(s): Deferred          Thoracic Spine Exam  Inspection: No masses, redness, or swelling Alignment: Asymmetric Functional ROM: Guarding Stability: No instability detected Sensory: Referred pain pattern Muscle strength & Tone: Complains of area being tender to palpation  Lumbar Spine Exam  Inspection: Well healed scar from previous spine surgery detected Alignment: Symmetrical Functional ROM: Guarding      Stability: No instability detected Muscle strength & Tone: Functionally intact Sensory: Referred pain pattern Palpation: Complains of area being tender to palpation       Provocative Tests: Lumbar Hyperextension and rotation test: Unable to perform       Patrick's Maneuver: Negative                    Gait & Posture Assessment  Ambulation: Patient ambulates using a  wheel chair Gait: Limited. Using assistive device to ambulate Posture: Difficulty standing up straight, due to pain   Lower Extremity Exam    Side: Right lower extremity  Side: Left lower extremity  Inspection: No masses, redness, swelling, or asymmetry. No contractures  Inspection: No masses, redness, swelling, or asymmetry. No contractures  Functional ROM: Unrestricted ROM          Functional ROM: Unrestricted ROM          Muscle strength & Tone: Functionally intact  Muscle strength & Tone: Functionally intact  Sensory: Unimpaired  Sensory: Unimpaired  Palpation: No palpable anomalies  Palpation: No palpable anomalies   Assessment  Primary Diagnosis & Pertinent Problem List: Diagnoses of Chronic upper back pain (Primary Area of Pain) (R>L), Chronic bilateral low back pain without sciatica (Secondary Area of Pain) (R>L), Chronic pain syndrome, Long term current use of opiate analgesic, Pharmacologic therapy, Disorder of skeletal system, and Problems influencing health status were pertinent to this visit.  Visit Diagnosis: 1. Chronic upper back pain (Primary Area of Pain) (R>L)   2. Chronic bilateral low back pain without sciatica (Secondary Area of Pain) (R>L)   3. Chronic pain syndrome   4. Long term current use of opiate analgesic   5. Pharmacologic therapy   6. Disorder of skeletal system   7. Problems influencing health status    Plan of Care  Initial treatment plan:  Please be advised that as per protocol, today's visit has been an evaluation only. We have not taken over the patient's controlled substance management.  Problem-specific plan: No problem-specific Assessment & Plan notes found for this encounter.  Ordered Lab-work, Procedure(s), Referral(s), & Consult(s): Orders Placed This Encounter  Procedures  . Compliance Drug Analysis, Ur  . Comp. Metabolic Panel (12)  . Magnesium  . Vitamin B12  . Sedimentation rate  . 25-Hydroxyvitamin D Lcms D2+D3  . C-reactive  protein   Pharmacotherapy: Medications ordered:  No orders of the defined types were placed in this encounter.  Medications administered during this visit: Mayla Biddy. Cohen had no medications administered during this visit.   Pharmacotherapy under consideration:  Opioid Analgesics: The patient was informed that there is no guarantee that she would be a candidate for opioid analgesics. The decision will be made  following CDC guidelines. This decision will be based on the results of diagnostic studies, as well as Ms. Moone's risk profile.  Membrane stabilizer: To be determined at a later time Muscle relaxant: To be determined at a later time NSAID: To be determined at a later time Other analgesic(s): To be determined at a later time   Interventional therapies under consideration: Ms. Nordell was informed that there is no guarantee that she would be a candidate for interventional therapies. The decision will be based on the results of diagnostic studies, as well as Ms. Bohan's risk profile.  Possible procedure(s): Diagnostic bilateral thoracic nerve block Diagnostic bilateral intra-costal nerve block Diagnostic bilateral lumbar facet nerve block   Provider-requested follow-up: Return for 2nd Visit, w/ Dr. Dossie Arbour.  Future Appointments  Date Time Provider [place]  03/21/2018 11:30 AM Milinda Pointer, MD [facility] None    Primary Care Physician: [name], MD Location: [facility] Outpatient Pain Management Facility Note by:  Date: 03/01/2018; Time: 2:14 PM  Pain Score Disclaimer: We use the NRS-11 scale. This is a self-reported, subjective measurement of pain severity with only modest accuracy. It is used primarily to identify changes within a particular patient. It must be understood that outpatient pain scales are significantly less accurate that those used for research, where they can be applied under ideal controlled circumstances with minimal exposure to  variables. In reality, the score is likely to be a combination of pain intensity and pain affect, where pain affect describes the degree of emotional arousal or changes in action readiness caused by the sensory experience of pain. Factors such as social and work situation, setting, emotional state, anxiety levels, expectation, and prior pain experience may influence pain perception and show large inter-individual differences that may also be affected by time variables.  Patient instructions provided during this appointment: Patient Instructions   ____________________________________________________________________________________________  Appointment Policy Summary  It is our goal and responsibility to provide the medical community with assistance in the evaluation and management of patients with chronic pain. Unfortunately our resources are limited. Because we do not have an unlimited amount of time, or available appointments, we are required to closely monitor and manage their use. The following rules exist to maximize their use:  Patient's responsibilities: 1. Punctuality:  At what time should I arrive? You should be physically present in our office 30 minutes before your scheduled appointment. Your scheduled appointment is with your assigned healthcare provider. However, it takes 5-10 minutes to be "checked-in", and another 15 minutes for the nurses to do the admission. If you arrive to our office at the time you were given for your appointment, you will end up being at least 20-25 minutes late to your appointment with the provider. 2. Tardiness:  What happens if I arrive only a few minutes after my scheduled appointment time? You will need to reschedule your appointment. The cutoff is your appointment time. This is why it is so important that you arrive at least 30 minutes before that appointment. If you have an appointment scheduled for 10:00 AM and you arrive at 10:01, you will be required to  reschedule your appointment.  3. Plan ahead:  Always assume that you will encounter traffic on your way in. Plan for it. If you are dependent on a driver, make sure they understand these rules and the need to arrive early. 4. Other appointments and responsibilities:  Avoid scheduling any other appointments before or after your pain clinic appointments.  5. Be prepared:  Write down  everything that you need to discuss with your healthcare provider and give this information to the admitting nurse. Write down the medications that you will need refilled. Bring your pills and bottles (even the empty ones), to all of your appointments, except for those where a procedure is scheduled. 6. No children or pets:  Find someone to take care of them. It is not appropriate to bring them in. 7. Scheduling changes:  We request "advanced notification" of any changes or cancellations. 8. Advanced notification:  Defined as a time period of more than 24 hours prior to the originally scheduled appointment. This allows for the appointment to be offered to other patients. 9. Rescheduling:  When a visit is rescheduled, it will require the cancellation of the original appointment. For this reason they both fall within the category of "Cancellations".  10. Cancellations:  They require advanced notification. Any cancellation less than 24 hours before the  appointment will be recorded as a "No Show". 11. No Show:  Defined as an unkept appointment where the patient failed to notify or declare to the practice their intention or inability to keep the appointment.  Corrective process for repeat offenders:  1. Tardiness: Three (3) episodes of rescheduling due to late arrivals will be recorded as one (1) "No Show". 2. Cancellation or reschedule: Three (3) cancellations or rescheduling will be recorded as one (1) "No Show". 3. "No Shows": Three (3) "No Shows" within a 12 month period will result in discharge from the  practice. ____________________________________________________________________________________________   ______________________________________________________________________________________________  Specialty Pain Scale  Introduction:  There are significant differences in how pain is reported. The word pain usually refers to physical pain, but it is also a common synonym of suffering. The medical community uses a scale from 0 (zero) to 10 (ten) to report pain level. Zero (0) is described as "no pain", while ten (10) is described as "the worse pain you can imagine". The problem with this scale is that physical pain is reported along with suffering. Suffering refers to mental pain, or more often yet it refers to any unpleasant feeling, emotion or aversion associated with the perception of harm or threat of harm. It is the psychological component of pain.  Pain Specialists prefer to separate the two components. The pain scale used by this practice is the Verbal Numerical Rating Scale (VNRS-11). This scale is for the physical pain only. DO NOT INCLUDE how your pain psychologically affects you. This scale is for adults 71 years of age and older. It has 11 (eleven) levels. The 1st level is 0/10. This means: "right now, I have no pain". In the context of pain management, it also means: "right now, my physical pain is under control with the current therapy".  General Information:  The scale should reflect your current level of pain. Unless you are specifically asked for the level of your worst pain, or your average pain. If you are asked for one of these two, then it should be understood that it is over the past 24 hours.  Levels 1 (one) through 5 (five) are described below, and can be treated as an outpatient. Ambulatory pain management facilities such as ours are more than adequate to treat these levels. Levels 6 (six) through 10 (ten) are also described below, however, these must be treated as a  hospitalized patient. While levels 6 (six) and 7 (seven) may be evaluated at an urgent care facility, levels 8 (eight) through 10 (ten) constitute medical emergencies and as such, they  belong in a hospital's emergency department. When having these levels (as described below), do not come to our office. Our facility is not equipped to manage these levels. Go directly to an urgent care facility or an emergency department to be evaluated.  Definitions:  Activities of Daily Living (ADL): Activities of daily living (ADL or ADLs) is a term used in healthcare to refer to people's daily self-care activities. Health professionals often use a person's ability or inability to perform ADLs as a measurement of their functional status, particularly in regard to people post injury, with disabilities and the elderly. There are two ADL levels: Basic and Instrumental. Basic Activities of Daily Living (BADL  or BADLs) consist of self-care tasks that include: Bathing and showering; personal hygiene and grooming (including brushing/combing/styling hair); dressing; Toilet hygiene (getting to the toilet, cleaning oneself, and getting back up); eating and self-feeding (not including cooking or chewing and swallowing); functional mobility, often referred to as "transferring", as measured by the ability to walk, get in and out of bed, and get into and out of a chair; the broader definition (moving from one place to another while performing activities) is useful for people with different physical abilities who are still able to get around independently. Basic ADLs include the things many people do when they get up in the morning and get ready to go out of the house: get out of bed, go to the toilet, bathe, dress, groom, and eat. On the average, loss of function typically follows a particular order. Hygiene is the first to go, followed by loss of toilet use and locomotion. The last to go is the ability to eat. When there is only one  remaining area in which the person is independent, there is a 62.9% chance that it is eating and only a 3.5% chance that it is hygiene. Instrumental Activities of Daily Living (IADL or IADLs) are not necessary for fundamental functioning, but they let an individual live independently in a community. IADL consist of tasks that include: cleaning and maintaining the house; home establishment and maintenance; care of others (including selecting and supervising caregivers); care of pets; child rearing; managing money; managing financials (investments, etc.); meal preparation and cleanup; shopping for groceries and necessities; moving within the community; safety procedures and emergency responses; health management and maintenance (taking prescribed medications); and using the telephone or other form of communication.  Instructions:  Most patients tend to report their pain as a combination of two factors, their physical pain and their psychosocial pain. This last one is also known as "suffering" and it is reflection of how physical pain affects you socially and psychologically. From now on, report them separately.  From this point on, when asked to report your pain level, report only your physical pain. Use the following table for reference.  Pain Clinic Pain Levels (0-5/10)  Pain Level Score  Description  No Pain 0   Mild pain 1 Nagging, annoying, but does not interfere with basic activities of daily living (ADL). Patients are able to eat, bathe, get dressed, toileting (being able to get on and off the toilet and perform personal hygiene functions), transfer (move in and out of bed or a chair without assistance), and maintain continence (able to control bladder and bowel functions). Blood pressure and heart rate are unaffected. A normal heart rate for a healthy adult ranges from 60 to 100 bpm (beats per minute).   Mild to moderate pain 2 Noticeable and distracting. Impossible to hide from other people.  More  frequent flare-ups. Still possible to adapt and function close to normal. It can be very annoying and may have occasional stronger flare-ups. With discipline, patients may get used to it and adapt.   Moderate pain 3 Interferes significantly with activities of daily living (ADL). It becomes difficult to feed, bathe, get dressed, get on and off the toilet or to perform personal hygiene functions. Difficult to get in and out of bed or a chair without assistance. Very distracting. With effort, it can be ignored when deeply involved in activities.   Moderately severe pain 4 Impossible to ignore for more than a few minutes. With effort, patients may still be able to manage work or participate in some social activities. Very difficult to concentrate. Signs of autonomic nervous system discharge are evident: dilated pupils (mydriasis); mild sweating (diaphoresis); sleep interference. Heart rate becomes elevated (>115 bpm). Diastolic blood pressure (lower number) rises above 100 mmHg. Patients find relief in laying down and not moving.   Severe pain 5 Intense and extremely unpleasant. Associated with frowning face and frequent crying. Pain overwhelms the senses.  Ability to do any activity or maintain social relationships becomes significantly limited. Conversation becomes difficult. Pacing back and forth is common, as getting into a comfortable position is nearly impossible. Pain wakes you up from deep sleep. Physical signs will be obvious: pupillary dilation; increased sweating; goosebumps; brisk reflexes; cold, clammy hands and feet; nausea, vomiting or dry heaves; loss of appetite; significant sleep disturbance with inability to fall asleep or to remain asleep. When persistent, significant weight loss is observed due to the complete loss of appetite and sleep deprivation.  Blood pressure and heart rate becomes significantly elevated. Caution: If elevated blood pressure triggers a pounding headache associated with  blurred vision, then the patient should immediately seek attention at an urgent or emergency care unit, as these may be signs of an impending stroke.    Emergency Department Pain Levels (6-10/10)  Emergency Room Pain 6 Severely limiting. Requires emergency care and should not be seen or managed at an outpatient pain management facility. Communication becomes difficult and requires great effort. Assistance to reach the emergency department may be required. Facial flushing and profuse sweating along with potentially dangerous increases in heart rate and blood pressure will be evident.   Distressing pain 7 Self-care is very difficult. Assistance is required to transport, or use restroom. Assistance to reach the emergency department will be required. Tasks requiring coordination, such as bathing and getting dressed become very difficult.   Disabling pain 8 Self-care is no longer possible. At this level, pain is disabling. The individual is unable to do even the most "basic" activities such as walking, eating, bathing, dressing, transferring to a bed, or toileting. Fine motor skills are lost. It is difficult to think clearly.   Incapacitating pain 9 Pain becomes incapacitating. Thought processing is no longer possible. Difficult to remember your own name. Control of movement and coordination are lost.   The worst pain imaginable 10 At this level, most patients pass out from pain. When this level is reached, collapse of the autonomic nervous system occurs, leading to a sudden drop in blood pressure and heart rate. This in turn results in a temporary and dramatic drop in blood flow to the brain, leading to a loss of consciousness. Fainting is one of the body's self defense mechanisms. Passing out puts the brain in a calmed state and causes it to shut down for a while, in order to begin the  healing process.    Summary: 1. Refer to this scale when providing [place] with your pain level. 2. Be accurate and careful  when reporting your pain level. This will help with your care. 3. Over-reporting your pain level will lead to loss of credibility. 4. Even a level of 1/10 means that there is pain and will be treated at our facility. 5. High, inaccurate reporting will be documented as "Symptom Exaggeration", leading to loss of credibility and suspicions of possible secondary gains such as obtaining more narcotics, or wanting to appear disabled, for fraudulent reasons. 6. Only pain levels of 5 or below will be seen at our facility. 7. Pain levels of 6 and above will be sent to the Emergency Department and the appointment cancelled. ______________________________________________________________________________________________

## 2018-03-06 LAB — C-REACTIVE PROTEIN: CRP: 15 mg/L — ABNORMAL HIGH (ref 0–10)

## 2018-03-06 LAB — COMP. METABOLIC PANEL (12)
AST: 19 IU/L (ref 0–40)
Albumin/Globulin Ratio: 1.7 (ref 1.2–2.2)
Albumin: 3.8 g/dL (ref 3.7–4.7)
Alkaline Phosphatase: 95 IU/L (ref 39–117)
BUN/Creatinine Ratio: 16 (ref 12–28)
BUN: 12 mg/dL (ref 8–27)
Bilirubin Total: <0.2 mg/dL (ref 0.0–1.2)
Calcium: 9.5 mg/dL (ref 8.7–10.3)
Chloride: 95 mmol/L — ABNORMAL LOW (ref 96–106)
Creatinine, Ser: 0.74 mg/dL (ref 0.57–1.00)
GFR calc Af Amer: 93 mL/min/{1.73_m2} (ref >59)
GFR calc non Af Amer: 81 mL/min/{1.73_m2} (ref >59)
Globulin, Total: 2.3 g/dL (ref 1.5–4.5)
Glucose: 76 mg/dL (ref 65–99)
Potassium: 4.6 mmol/L (ref 3.5–5.2)
Sodium: 138 mmol/L (ref 134–144)
TOTAL PROTEIN: 6.1 g/dL (ref 6.0–8.5)

## 2018-03-06 LAB — SEDIMENTATION RATE: Sed Rate: 12 mm/hr (ref 0–40)

## 2018-03-06 LAB — 25-HYDROXY VITAMIN D LCMS D2+D3
25-Hydroxy, Vitamin D-2: <1 ng/mL
25-Hydroxy, Vitamin D-3: 47 ng/mL
25-Hydroxy, Vitamin D: 47 ng/mL

## 2018-03-06 LAB — VITAMIN B12: Vitamin B-12: 990 pg/mL (ref 232–1245)

## 2018-03-06 LAB — MAGNESIUM: Magnesium: 2.2 mg/dL (ref 1.6–2.3)

## 2018-03-06 LAB — COMPLIANCE DRUG ANALYSIS, UR

## 2018-03-21 ENCOUNTER — Ambulatory Visit
Admission: RE | Admit: 2018-03-21 | Discharge: 2018-03-21 | Disposition: A | Discharge status: Home / Self Care | Payer: Medicare Other | Source: Ambulatory Visit | Attending: Pain Medicine | Admitting: Pain Medicine

## 2018-03-21 ENCOUNTER — Ambulatory Visit: Payer: Medicare Other | Admitting: Pain Medicine

## 2018-03-21 ENCOUNTER — Other Ambulatory Visit: Payer: Self-pay

## 2018-03-21 ENCOUNTER — Encounter: Payer: Self-pay | Admitting: Pain Medicine

## 2018-03-21 VITALS — BP 133/88 | HR 86 | Temp 97.5°F | Resp 24 | Ht 65.0 in | Wt 104.0 lb

## 2018-03-21 DIAGNOSIS — S22080S Wedge compression fracture of T11-T12 vertebra, sequela: Secondary | ICD-10-CM | POA: Insufficient documentation

## 2018-03-21 DIAGNOSIS — S22050S Wedge compression fracture of T5-T6 vertebra, sequela: Secondary | ICD-10-CM

## 2018-03-21 DIAGNOSIS — S32010S Wedge compression fracture of first lumbar vertebra, sequela: Secondary | ICD-10-CM | POA: Insufficient documentation

## 2018-03-21 DIAGNOSIS — G8929 Other chronic pain: Secondary | ICD-10-CM

## 2018-03-21 DIAGNOSIS — M7918 Myalgia, other site: Secondary | ICD-10-CM

## 2018-03-21 DIAGNOSIS — S32000S Wedge compression fracture of unspecified lumbar vertebra, sequela: Secondary | ICD-10-CM | POA: Insufficient documentation

## 2018-03-21 DIAGNOSIS — M549 Dorsalgia, unspecified: Secondary | ICD-10-CM

## 2018-03-21 DIAGNOSIS — M5134 Other intervertebral disc degeneration, thoracic region: Secondary | ICD-10-CM | POA: Insufficient documentation

## 2018-03-21 DIAGNOSIS — Z7901 Long term (current) use of anticoagulants: Secondary | ICD-10-CM | POA: Insufficient documentation

## 2018-03-21 DIAGNOSIS — Z9889 Other specified postprocedural states: Secondary | ICD-10-CM | POA: Diagnosis present

## 2018-03-21 DIAGNOSIS — S32030S Wedge compression fracture of third lumbar vertebra, sequela: Secondary | ICD-10-CM | POA: Insufficient documentation

## 2018-03-21 DIAGNOSIS — Z8781 Personal history of (healed) traumatic fracture: Secondary | ICD-10-CM | POA: Insufficient documentation

## 2018-03-21 DIAGNOSIS — I480 Paroxysmal atrial fibrillation: Secondary | ICD-10-CM

## 2018-03-21 DIAGNOSIS — R937 Abnormal findings on diagnostic imaging of other parts of musculoskeletal system: Secondary | ICD-10-CM

## 2018-03-21 DIAGNOSIS — E43 Unspecified severe protein-calorie malnutrition: Secondary | ICD-10-CM | POA: Insufficient documentation

## 2018-03-21 DIAGNOSIS — Z789 Other specified health status: Secondary | ICD-10-CM

## 2018-03-21 DIAGNOSIS — M47816 Spondylosis without myelopathy or radiculopathy, lumbar region: Secondary | ICD-10-CM

## 2018-03-21 DIAGNOSIS — M792 Neuralgia and neuritis, unspecified: Secondary | ICD-10-CM | POA: Insufficient documentation

## 2018-03-21 DIAGNOSIS — M4310 Spondylolisthesis, site unspecified: Secondary | ICD-10-CM

## 2018-03-21 DIAGNOSIS — S32020S Wedge compression fracture of second lumbar vertebra, sequela: Secondary | ICD-10-CM | POA: Insufficient documentation

## 2018-03-21 DIAGNOSIS — Z79899 Other long term (current) drug therapy: Secondary | ICD-10-CM

## 2018-03-21 DIAGNOSIS — M899 Disorder of bone, unspecified: Secondary | ICD-10-CM

## 2018-03-21 DIAGNOSIS — M5136 Other intervertebral disc degeneration, lumbar region: Secondary | ICD-10-CM

## 2018-03-21 DIAGNOSIS — S32050S Wedge compression fracture of fifth lumbar vertebra, sequela: Secondary | ICD-10-CM | POA: Insufficient documentation

## 2018-03-21 DIAGNOSIS — G894 Chronic pain syndrome: Secondary | ICD-10-CM

## 2018-03-21 DIAGNOSIS — S32040S Wedge compression fracture of fourth lumbar vertebra, sequela: Secondary | ICD-10-CM | POA: Insufficient documentation

## 2018-03-21 DIAGNOSIS — S22000S Wedge compression fracture of unspecified thoracic vertebra, sequela: Secondary | ICD-10-CM

## 2018-03-21 DIAGNOSIS — S22070S Wedge compression fracture of T9-T10 vertebra, sequela: Secondary | ICD-10-CM

## 2018-03-21 DIAGNOSIS — M48061 Spinal stenosis, lumbar region without neurogenic claudication: Secondary | ICD-10-CM

## 2018-03-21 DIAGNOSIS — Z79891 Long term (current) use of opiate analgesic: Secondary | ICD-10-CM

## 2018-03-21 DIAGNOSIS — I428 Other cardiomyopathies: Secondary | ICD-10-CM

## 2018-03-21 DIAGNOSIS — J479 Bronchiectasis, uncomplicated: Secondary | ICD-10-CM | POA: Insufficient documentation

## 2018-03-21 DIAGNOSIS — M545 Low back pain, unspecified: Secondary | ICD-10-CM

## 2018-03-21 DIAGNOSIS — M51369 Other intervertebral disc degeneration, lumbar region without mention of lumbar back pain or lower extremity pain: Secondary | ICD-10-CM

## 2018-03-21 NOTE — Progress Notes (Signed)
Patient's Name: Ana Washington  MRN: [id number]  Referring Provider: [name], MD  DOB: Mar 26, 1945  PCP: [name], MD  DOS: 03/21/2018  Note by: Gaspar Cola, MD  Service setting: Ambulatory outpatient  Specialty: Interventional Pain Management  Location: [facility] (AMB) Pain Management Facility    Patient type: Established   Primary Reason(s) for Visit: Encounter for evaluation before starting new chronic pain management plan of care (Level of risk: moderate) CC: Back Pain (lower)  HPI  Ana Washington is a 73 y.o. year old, female patient, who comes today for a follow-up evaluation to review the test results and decide on a treatment plan. 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[place]); Non-ischemic cardiomyopathy ([place]); Osteoporosis, post-menopausal; Other dysphagia; Primary localized osteoarthrosis, lower leg; Psoriasis; Unintended weight loss; Chronic upper back pain (Primary Area of Pain) (Bilateral) (R>L); Chronic low back pain (Secondary Area of Pain) (Bilateral) (R>L) w/o sciatica; Chronic pain syndrome; Long term current use of opiate analgesic; Pharmacologic therapy; Disorder of skeletal system; Problems influencing health status; Abnormal MRI, lumbar spine (12/15/2016); Lumbar compression fractures, sequela (L1, L2, L3, L4, and L5); Closed compression fracture of L1 lumbar vertebra, sequela; Closed compression fracture of L2 lumbar vertebra, sequela; Closed compression fracture of L3  lumbar vertebra, sequela; Closed compression fracture of L4 lumbar vertebra, sequela; Closed compression fracture of L5 lumbar vertebra, sequela; Thoracic compression fracture, sequela (T5, T9, T11, and T12); Closed compression fracture of T5 thoracic vertebra, sequela; Closed compression fracture of T9 thoracic vertebra, sequela; Close compression fracture of T11 thoracic vertebra, sequela; Closed compression fracture of T12 thoracic vertebra, sequela; Lumbar facet hypertrophy; Grade 1  Lumbar Anterolisthesis of L3/4 and L4/5; Lumbar central spinal stenosis (Multilevel), 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 claudication; Chronic anticoagulation (ELIQUIS); History of pelvic fracture; Chronic musculoskeletal pain; Neurogenic pain; Long term prescription benzodiazepine use; and DDD (degenerative disc disease), thoracic on their problem list. Her primarily concern today is the Back Pain (lower)  Pain Assessment: Location: Lower Back Radiating: denies Onset: More than a month ago Duration: Chronic pain Quality: Sharp Severity: 3 /10 (subjective, self-reported pain score)  Note: Reported level is compatible with observation.                         When using our objective Pain Scale, levels between 6 and 10/10 are said to belong in an emergency room, as it progressively worsens from a 6/10, described as severely limiting, requiring emergency care not usually available at an outpatient pain management facility. At a 6/10 level, communication becomes difficult and requires great effort. Assistance to reach the emergency department may be required. Facial flushing and profuse sweating along with potentially dangerous increases in heart rate and blood pressure will be evident. Timing: Constant Modifying factors: heat, medications BP: 133/88  HR: 86  Ana Washington comes in today for a follow-up visit after her initial evaluation on 03/01/2018. Today we went over the results of her tests. These were explained in "Layman's terms".  During today's appointment we went over my diagnostic impression, as well as the proposed treatment plan.  According to the patient her primary area of pain is in her middle back.  She is status post kyphoplasty x3 for spontaneous compression fractures related to osteoporosis.  She admits that she was having some relief, until recently when she was opening up the refrigerator and felt another pop in her back and since then she has been having a flareup of her back pain.  She has had a recent MRI, unfortunately this MRI was done before this last event.  The patient indicates that she has not had any x-rays done since that last episode.  Because of this, we will be ordering x-rays of her thoracic and lumbar spine to assess for the possibility of another fracture.  If she does have another fracture, I have recommended to do another kyphoplasty for it.  The patient is not crazy about the idea since she indicates having ended up in the hospital after the last kyphoplasty.  However, these kyphoplasty's are the only things are preventing her vertebral bodies from completely collapsing into a "vertebra plana", which could then compress the exiting nerve roots at that level causing a very painful compressive radiculopathy.  In fact, the patient indicates that after this last event, she has been experiencing pain that goes to her abdominal region, bilaterally, suggesting a bilateral thoracic radiculitis.  Once I get the x-rays done and find the level that is affecting her, I will try to go to that area with an epidural steroid injection to see if we can help with some of the pain.   The patient indicates that Rachelle Hora, PA-C as ordered a bone density test before she can start having some injections for the osteoporosis.  Today I inquired a little bit about her osteoporosis and she indicates that she has had problems with this for a long time and at one point she tried some oral bisphosphonates, which apparently  irritate her GI tract and then she stopped taking them.  I have reminded the patient and nowadays there are many other options including injections and she needs to be taking some of those.  At the very least, she should be taking a combination of calcium, vitamin D, magnesium to make sure to put that calcium into the bone.  Other options include intranasal salmon calcitonin, if she cannot take the bisphosphonates.  Although the bone density test may confirm the patient's diagnosis, it is very obvious to me at this point that she does have a problem and therefore I would not withhold treatment until we get those results back.  However, osteoporosis is not something that we treat at the pain clinic and therefore this will fall under the category of things that the patient's primary care physician will need to follow-up with.  The next concern that I have with this patient is her weak physical status.  She does have significant protein malnutrition, she has atrial fibrillation, and she also has Mycobacterium avium and bronchiectasis.  She is on Eliquis for the A-fib and this will need to be stopped before we can do those epidurals.  For this we will need to get clearance from her prescribing physician to stop the medicine.  Today we have explained to the patient the risks of doing so and we have also provided her with written information with the general risk and possible complications associated with interventional therapies.  Aside from this, my biggest concern with this patient is her medication intake.  In talking to the patient, it would seem to me that she has several different opioids at home as well as benzodiazepines and by her  own account, she will take anything that she can get her hands on when she starts having the pain.  This is clearly a concern to me and therefore I have requested that they take all other medications and bring him here so that we can go over them and dispose of those that are  expired, as well as those that we do not really want the patient to have access to.  I did talk to the patient today about her urine drug screen test and how it had evidence of hydrocodone, lorazepam, and tramadol, all in the same sample, which is concerning to me.  If did this we at the fact that the patient has COPD and she is oxygen dependent, it is of great concern that a drug drug interaction can lead her to have respiratory depression and death.  She was recently given a prescription for morphine 100 mg/5 mL (20 mg/mL).  The concern that I have is that this patient had previously indicated to me that her condition had been getting better to the point where she had stopped all of her opioids.  By looking at her PMP, a physician can get the impression that she may have opioid tolerance, when in fact she may not.  Second area of pain is in her lower back.  She is status post laminectomy. She denies any pain from this area. She has had a recent MRI.  Since the patient has a history of Mycobacterium avium complex (MAC), which is a group of bacteria related to tuberculosis, I wonder if the orthopedic surgeons have thought about the possibility of tuberculosis of the spine or Pott's disease, a form of tuberculosis that occurs outside the lungs whereby disease is seen in the vertebrae.  In this condition the lower thoracic and upper lumbar vertebrae are the areas of the spine most often affected. The formal name for the disease is tuberculous spondylitis.   She admits that she was having some leg spasms however that has resolved.  She still is unable to walk without a walker.  She is currently under hospice care for mycobacterium avium complex and bronchiectasis.  In considering the treatment plan options, Ana Washington was reminded that I no longer take patients for medication management only. I asked her to let me know if she had no intention of taking advantage of the interventional therapies, so that we  could make arrangements to provide this space to someone interested. I also made it clear that undergoing interventional therapies for the purpose of getting pain medications is very inappropriate on the part of a patient, and it will not be tolerated in this practice. This type of behavior would suggest true addiction and therefore it requires referral to an addiction specialist.   Further details on both, my assessment(s), as well as the proposed treatment plan, please see below.  Controlled Substance Pharmacotherapy Assessment REMS (Risk Evaluation and Mitigation Strategy)  Analgesic: Tramadol 50 mg 1 tablet 4 times daily (fill date was 01/26/2018) tramadol 200 mg/day.  Since the patient's last visit to our clinics, on 03/17/2018 she received a prescription for Morphine Sulf 100 mg/5 mL (20 mg/mL)(quantity: 30 mL), prescribed by Lady Deutscher, MD, in [place], [place].  The prescription was written to last 3 days, which puts its MME at 200 mg/day.  In addition we have identified this patient to also be taking benzodiazepines in combination with the opioid analgesics. Highest recorded MME/day: 40 mg/day MME/day: 20 mg/day (200 mg/day) Pill Count: None  expected due to no prior prescriptions written by our practice. Ana Martins, RN  03/21/2018 11:40 AM  Sign when Signing Visit Safety precautions to be maintained throughout the outpatient stay will include: orient to surroundings, keep bed in low position, maintain call bell within reach at all times, provide assistance with transfer out of bed and ambulation.    Pharmacokinetics: Liberation and absorption (onset of action): WNL Distribution (time to peak effect): WNL Metabolism and excretion (duration of action): WNL         Pharmacodynamics: Desired effects: Analgesia: Ms. Band reports >50% benefit. Functional ability: Patient reports that medication allows her to accomplish basic ADLs Clinically meaningful improvement in function (CMIF):  Sustained CMIF goals met Perceived effectiveness: Described as relatively effective, allowing for increase in activities of daily living (ADL) Undesirable effects: Side-effects or Adverse reactions: None reported Monitoring: [place] PMP: Online review of the past 29-monthperiod previously conducted. Not applicable at this point since we have not taken over the patient's medication management yet. List of other Serum/Urine Drug Screening Test(s):  No results found. List of all UDS test(s) done:  Lab Results  Component Value Date   SUMMARY FINAL 03/01/2018   Last UDS on record: Summary  Date Value Ref Range Status  03/01/2018 FINAL  Final    Comment:    ==================================================================== TOXAS[facility]RE COMP DRUG ANALYSIS,UR ==================================================================== Test                             Result       Flag       Units Drug Present and Declared for Prescription Verification   Norhydrocodone                 94           EXPECTED   ng/mg creat    Norhydrocodone is an expected metabolite of hydrocodone.   Gabapentin                     PRESENT      EXPECTED   Acetaminophen                  PRESENT      EXPECTED   Chlorpheniramine               PRESENT      EXPECTED   Diltiazem                      PRESENT      EXPECTED Drug Present not Declared for Prescription Verification   Lorazepam                      2903         UNEXPECTED ng/mg creat    Source of lorazepam is a scheduled prescription medication.   Tramadol                       2856         UNEXPECTED ng/mg creat   O-Desmethyltramadol            4932         UNEXPECTED ng/mg creat   N-Desmethyltramadol            5821         UNEXPECTED ng/mg creat    Source of tramadol is a prescription medication.    O-desmethyltramadol and  N-desmethyltramadol are expected    metabolites of tramadol.   Metaxalone                     PRESENT      UNEXPECTED   Metoprolol                      PRESENT      UNEXPECTED Drug Absent but Declared for Prescription Verification   Hydrocodone                    Not Detected UNEXPECTED ng/mg creat    Hydrocodone is almost always present in patients taking this drug    consistently. Absence of hydrocodone could be due to lapse of    time since the last dose or unusual pharmacokinetics (rapid    metabolism).   Salicylate                     Not Detected UNEXPECTED    Aspirin, as indicated in the declared medication list, is not    always detected even when used as directed. ==================================================================== Test                      Result    Flag   Units      Ref Range   Creatinine              63               mg/dL      >=20 ==================================================================== Declared Medications:  The flagging and interpretation on this report are based on the  following declared medications.  Unexpected results may arise from  inaccuracies in the declared medications.  **Note: The testing scope of this panel includes these medications:  Chlorpheniramine (Tussionex)  Diltiazem (Cardizem)  Gabapentin (Neurontin)  Hydrocodone (Tussionex)  **Note: The testing scope of this panel does not include small to  moderate amounts of these reported medications:  Acetaminophen (Tylenol)  Aspirin (Aspirin 81)  **Note: The testing scope of this panel does not include following  reported medications:  Amlodipine (Norvasc)  Apixaban (Eliquis)  Azithromycin (Zithromax)  Calcium carbonate (Calcium carbonate/Vitamin D)  Cholecalciferol  Ciprofloxacin (Cipro)  Iron (Ferrous Sulfate)  Vitamin C  Vitamin D (Calcium carbonate/Vitamin D) ==================================================================== For clinical consultation, please call [phone]. ====================================================================    UDS interpretation: Unexpected findings: The patient was  given a final warning about the accuracy of reporting medications. Medication Assessment Form: Patient introduced to form today Treatment compliance: Treatment may start today if patient agrees with proposed plan. Evaluation of compliance is not applicable at this point Risk Assessment Profile: Aberrant behavior: See initial evaluations. None observed or detected today Comorbid factors increasing risk of overdose: Benzodiazepine use, caucasian, concomitant use of Benzodiazepines, COPD or asthma, high daily dosage , history of multiple prescribers, multiple prescribers and mulitple prescriptions Opioid risk tool (ORT):  Opioid Risk  03/01/2018  Alcohol 0  Illegal Drugs 0  Rx Drugs 0  Alcohol 0  Illegal Drugs 0  Rx Drugs 0  Age between 16-45 years  0  History of Preadolescent Sexual Abuse 0  Psychological Disease 0  Depression 0  Opioid Risk Tool Scoring 0  Opioid Risk Interpretation Low Risk    ORT Scoring interpretation table:  Score <3 = Low Risk for SUD  Score between 4-7 = Moderate Risk for SUD  Score >8 = High Risk for Opioid Abuse  Risk of substance use disorder (SUD): Moderate  Risk Mitigation Strategies:  Patient opioid safety counseling: Completed today. Counseling provided to patient as per "Patient Counseling Document". Document signed by patient, attesting to counseling and understanding Patient-Prescriber Agreement (PPA): Obtained today.  Controlled substance notification to other providers: Written and sent today.  Pharmacologic Plan: Today we may be taking over the patient's pharmacological regimen. See below.             Laboratory Chemistry  Inflammation Markers (CRP: Acute Phase) (ESR: Chronic Phase) Lab Results  Component Value Date   CRP 15 (H) 03/01/2018   ESRSEDRATE 12 03/01/2018                         Rheumatology Markers No results found.  Renal Function Markers Lab Results  Component Value Date   BUN 12 03/01/2018   CREATININE 0.74  03/01/2018   BCR 16 03/01/2018   GFRAA 93 03/01/2018   GFRNONAA 81 03/01/2018                             Hepatic Function Markers Lab Results  Component Value Date   AST 19 03/01/2018   ALBUMIN 3.8 03/01/2018   ALKPHOS 95 03/01/2018                        Electrolytes Lab Results  Component Value Date   NA 138 03/01/2018   K 4.6 03/01/2018   CL 95 (L) 03/01/2018   CALCIUM 9.5 03/01/2018   MG 2.2 03/01/2018                        Neuropathy Markers Lab Results  Component Value Date   VITAMINB12 990 03/01/2018                        CNS Tests No results found.  Bone Pathology Markers Lab Results  Component Value Date   25OHVITD1 47 03/01/2018   25OHVITD2 <1.0 03/01/2018   25OHVITD3 47 03/01/2018                         Coagulation Parameters Lab Results  Component Value Date   PLT 429 (H) 11/27/2017                        Cardiovascular Markers Lab Results  Component Value Date   HGB 12.3 11/27/2017   HCT 38.4 11/27/2017                         CA Markers No results found.  Endocrine Markers No results found.  Note: Lab results reviewed.  Recent Diagnostic Imaging Review  Thoracic Imaging: Thoracic MR wo contrast:  Results for orders placed during the hospital encounter of 01/04/18  MR THORACIC SPINE WO CONTRAST   Narrative CLINICAL DATA:  Severe mid to low back pain and difficulty walking. History of previous compression fractures.  EXAM: MRI THORACIC SPINE WITHOUT CONTRAST  TECHNIQUE: Multiplanar, multisequence MR imaging of the thoracic spine was performed. No intravenous contrast was administered.  COMPARISON:  None.  FINDINGS: Alignment:  Normal  Vertebrae: Old previously augmented fractures at T9, L1 and L2. Methylmethacrylate distribution appears good. There is an old healed minimal superior endplate fracture at T5 with loss of height  of only 10%. There is an inferior endplate fracture at Q76 with loss of height of 10%. There  is mild edema indicating that this is acute or subacute. This could be the cause of the patient's symptoms.  Cord:  Normal  Paraspinal and other soft tissues: Normal  Disc levels:  No significant degenerative changes. Canal and foramina widely patent.  IMPRESSION: Inferior endplate fracture at P95 with mild edema. Loss of height of about 10%. This is acute or subacute and could be symptomatic.  Old previously augmented fractures at T9, L1 and L2.  Old minor superior endplate fracture at T5 without augmentation.   Electronically Signed   By: Nelson Chimes M.D.   On: 01/04/2018 08:43    Thoracic DG 2-3 views:  Results for orders placed during the hospital encounter of 01/05/18  DG Thoracic Spine 2 View   Narrative CLINICAL DATA:  Kyphoplasty.  EXAM: DG C-ARM 61-120 MIN; THORACIC SPINE 2 VIEWS  FLUOROSCOPY TIME:  2 minutes and 20 seconds.  COMPARISON:  None.  FINDINGS: Fourteen fluoroscopic spot views of the lower thoracic spine demonstrating T12 and T11 vertebral body cement augmentation. Status post L1 cement augmentation.  IMPRESSION: Fluoroscopic imaging of T11 and T12 vertebral body cement augmentation.   Electronically Signed   By: Elon Alas M.D.   On: 01/05/2018 17:58    Lumbosacral Imaging: Lumbar MR wo contrast:  Results for orders placed during the hospital encounter of 12/15/17  MR LUMBAR SPINE WO CONTRAST   Narrative CLINICAL DATA:  Low back pain.  Recent kyphoplasty.  EXAM: MRI LUMBAR SPINE WITHOUT CONTRAST  TECHNIQUE: Multiplanar, multisequence MR imaging of the lumbar spine was performed. No intravenous contrast was administered.  COMPARISON:  MR 11/25/2017.  Intraoperative radiographs 11/29/2017.  FINDINGS: Segmentation:  Standard  Alignment: 4 mm anterolisthesis L4-5, facet mediated. 1-2 mm anterolisthesis L3-4, facet mediated.  Vertebrae: Recent vertebral augmentation L2 and L3. New linear acute bone marrow edema L1,  minor endplate depression, without significant wedging. No retropulsion. Tiny anterior inferior L1 corner fracture appears nondisplaced, see series 7, image 7. Remote compression deformities L4 and L5. Hemangioma L1.  Conus medullaris and cauda equina: Conus extends to the L1 level. Conus and cauda equina appear normal.  Paraspinal and other soft tissues: Unchanged LEFT renal cyst.  Disc levels:  L1-L2: Annular bulging accompanies retropulsion of L2. Facet arthropathy. Mild stenosis, not significantly increased from priors.  L2-L3: Annular bulge, minimal retropulsion of L3, mild facet arthropathy. Mild stenosis, no impingement.  L3-L4: Remote compression fracture of L4. Annular bulging. 1-2 mm retrolisthesis. Posterior element hypertrophy. Mild stenosis. No impingement.  L4-L5: 4 mm anterolisthesis. Uncovering of the disc with annular bulge. Remote compression fracture L5, minimal retropulsion. Facet arthropathy and ligamentum flavum hypertrophy. Mild to moderate stenosis. BILATERAL L5 neural impingement is possible.  L5-S1:  Annular bulge.  Facet arthropathy.  No impingement.  IMPRESSION: Minor compression fracture L1, minimal endplate deformity with acute bone marrow edema along the inferior aspect, not present 11/25/2017. No retropulsion.  Status post vertebral augmentation L2 and L3, with no adverse features.  Stable multilevel spondylosis, with mild stenosis L4-5.   Electronically Signed   By: Staci Righter M.D.   On: 12/15/2017 10:02    Lumbar DG 2-3 views:  Results for orders placed during the hospital encounter of 12/18/17  DG Lumbar Spine 2-3 Views   Narrative CLINICAL DATA:  Painful L1 vertebral body fracture.  EXAM: LUMBAR SPINE - 2-3 VIEW; DG C-ARM 61-120 MIN  COMPARISON:  MRI  lumbar spine 12/15/2017.  FINDINGS: L1 kyphoplasty has been performed. Satisfactory central position of methylmethacrylate. Previous L2 and L3 vertebral augmentation  are unchanged.  IMPRESSION: Status post L1 kyphoplasty.  No adverse features.   Electronically Signed   By: Staci Righter M.D.   On: 12/18/2017 16:53    Hip Imaging: Hip-R DG 2-3 views:  Results for orders placed during the hospital encounter of 07/30/17  DG Hip Unilat W or Wo Pelvis 2-3 Views Right   Narrative CLINICAL DATA:  Ground level fall  EXAM: DG HIP (WITH OR WITHOUT PELVIS) 2-3V RIGHT  COMPARISON:  None.  FINDINGS: SI joint degenerative changes. Probable acute minimally displaced fracture involving the right superior pubic ramus. Questionable nondisplaced right inferior pubic ramus fracture on the lateral view of the right hip. Right femoral head demonstrates normal alignment  IMPRESSION: 1. Suspected acute fractures of the right superior and inferior pubic rami.   Electronically Signed   By: Donavan Foil M.D.   On: 07/30/2017 22:46    Knee Imaging: Knee-L DG 4 views:  Results for orders placed during the hospital encounter of 12/05/13  DG Knee Complete 4 Views Left   Narrative CLINICAL DATA:  Left posterior knee pain, no trauma  EXAM: LEFT KNEE - COMPLETE 4+ VIEW  COMPARISON:  None.  FINDINGS: Mild to moderate tricompartmental degenerative changes, severe in the medial compartment.  Medial and lateral compartment chondrocalcinosis.  No suprapatellar knee joint effusion.  No fracture or dislocation is seen.  IMPRESSION: Mild to moderate degenerative changes with chondrocalcinosis.   Electronically Signed   By: Julian Hy M.D.   On: 12/05/2013 15:29    Elbow Imaging: Elbow-R DG Complete:  Results for orders placed during the hospital encounter of 07/30/17  DG Elbow Complete Right   Narrative CLINICAL DATA:  73 year old female with fall and right elbow pain.  EXAM: RIGHT ELBOW - COMPLETE 3+ VIEW  COMPARISON:  None.  FINDINGS: No definite acute fracture identified. A faint linear lucency through the medial epicondyle of  the humerus is likely chronic and less likely represent a nondisplaced fracture. The bones are osteopenic. There is elevation of the anterior fat pad representing joint effusion. An occult fracture primarily involving the radial head is not entirely excluded. The soft tissues are grossly unremarkable.  IMPRESSION: No definite acute fracture or dislocation. There is moderate joint effusion and therefore an occult fracture is not entirely excluded.   Electronically Signed   By: Anner Crete M.D.   On: 07/30/2017 22:46    Complexity Note: Imaging results reviewed. Results shared with Ana Washington, using Layman's terms.                         Meds   Current Outpatient Medications:  .  acetaminophen (TYLENOL) 500 MG tablet, Take 1,000 mg by mouth every 4 (four) hours. , Disp: , Rfl:  .  amLODipine (NORVASC) 5 MG tablet, Take 5 mg by mouth daily. , Disp: , Rfl:  .  apixaban (ELIQUIS) 5 MG TABS tablet, Take 5 mg by mouth daily., Disp: , Rfl:  .  Ascorbic Acid (VITAMIN C) 1000 MG tablet, Take 1,000 mg by mouth daily., Disp: , Rfl:  .  aspirin EC 81 MG tablet, Take 81 mg by mouth daily., Disp: , Rfl:  .  azithromycin (ZITHROMAX) 250 MG tablet, Take 250 mg by mouth daily. continuous, Disp: , Rfl:  .  Calcium Carbonate-Vitamin D (OYSTER SHELL/VITAMIN D) 600-125 MG-UNIT TABS, Take  1 tablet by mouth daily., Disp: , Rfl:  .  chlorpheniramine-HYDROcodone (TUSSIONEX) 10-8 MG/5ML SUER, Take 6 mLs by mouth at bedtime., Disp: , Rfl:  .  Cholecalciferol (VITAMIN D-1000 MAX ST) 1000 UNITS tablet, Take 1,000 Units by mouth daily. , Disp: , Rfl:  .  ciprofloxacin (CIPRO) 500 MG tablet, Take 500 mg by mouth 2 (two) times daily., Disp: , Rfl:  .  ferrous sulfate 325 (65 FE) MG EC tablet, Take 325 mg by mouth daily. , Disp: , Rfl:  .  gabapentin (NEURONTIN) 100 MG capsule, Take 300 mg by mouth at bedtime., Disp: , Rfl:  .  LORazepam (ATIVAN) 0.5 MG tablet, Take 0.5 mg by mouth every 4 (four) hours.  0.5 mg every 4 hours, Disp: , Rfl:  .  losartan (COZAAR) 100 MG tablet, Take 100 mg by mouth daily. take 1 tablet by mouth once daily, Disp: , Rfl:  .  metaxalone (SKELAXIN) 800 MG tablet, Take 800 mg by mouth 3 (three) times daily. , Disp: , Rfl:  .  metoprolol succinate (TOPROL-XL) 50 MG 24 hr tablet, Take 50 mg by mouth at bedtime. , Disp: , Rfl:  .  morphine (ROXANOL) 20 MG/ML concentrated solution, Take by mouth at bedtime as needed for severe pain (0.25 ml at bedtime as needed)., Disp: , Rfl:  .  Multiple Vitamin (MULTIVITAMIN WITH MINERALS) TABS tablet, Take 1 tablet by mouth daily. Women's 50+, Disp: , Rfl:  .  omeprazole (PRILOSEC OTC) 20 MG tablet, Take 40 mg by mouth daily. TAKES 2 TABLETS, Disp: , Rfl:  .  potassium chloride (K-DUR) 10 MEQ tablet, Take 10 mEq by mouth daily., Disp: , Rfl:  .  predniSONE (DELTASONE) 10 MG tablet, TAKE 1 TABLET(10 MG) BY MOUTH DAILY WITH BREAKFAST, Disp: 30 tablet, Rfl: 6 .  Probiotic Product (ALIGN) 4 MG CAPS, Take 4 mg by mouth daily., Disp: , Rfl:  .  sodium chloride 0.9 % nebulizer solution, Take 3 mLs by nebulization 2 (two) times daily as needed for wheezing. Wheezing , Disp: , Rfl: 0 .  sucralfate (CARAFATE) 1 g tablet, Take 1 g by mouth 4 (four) times daily., Disp: , Rfl:  .  traMADol (ULTRAM) 50 MG tablet, Take 1 tablet (50 mg total) by mouth every 6 (six) hours as needed., Disp: 50 tablet, Rfl: 1  ROS  Constitutional: Denies any fever or chills Gastrointestinal: No reported hemesis, hematochezia, vomiting, or acute GI distress Musculoskeletal: Denies any acute onset joint swelling, redness, loss of ROM, or weakness Neurological: No reported episodes of acute onset apraxia, aphasia, dysarthria, agnosia, amnesia, paralysis, loss of coordination, or loss of consciousness  Allergies  Ana Washington is allergic to sulfa antibiotics; codeine; and penicillins.  [place]  Drug: Ana Washington  reports no history of drug use. Alcohol:  reports no  history of alcohol use. Tobacco:  reports that she has never smoked. She has never used smokeless tobacco. Medical:  has a past medical history of Arthritis, Atrial fibrillation ([place]), Bronchiectasis ([place]), CAD (coronary artery disease), CAD (coronary artery disease) (07/30/2017), Dyspnea, Dysrhythmia, Essential hypertension, malignant (10/03/2013), Family history of adverse reaction to anesthesia, GERD (gastroesophageal reflux disease), Hypertension, Lung disease, Myocardial infarction ([place]) (2007), and [name] (postoperative nausea and vomiting). Surgical: Ms. Nelligan  has a past surgical history that includes Lung surgery (1990 and 1996); Back surgery; Cholecystectomy; Foot surgery; Kyphoplasty (N/A, 07/05/2016); Kyphoplasty (N/A, 11/29/2017); Kyphoplasty (N/A, 12/18/2017); and Kyphoplasty (N/A, 01/05/2018). Family: family history includes Hypertension in her father and mother.  Constitutional  Exam  General appearance: Well nourished, well developed, and well hydrated. In no apparent acute distress Vitals:   03/21/18 1140  BP: 133/88  Pulse: 86  Resp: (!) 24  Temp: (!) 97.5 F (36.4 C)  TempSrc: Oral  SpO2: 98%  Weight: 104 lb (47.2 kg)  Height: 5' 5"  (1.651 m)   BMI Assessment: Estimated body mass index is 17.31 kg/m as calculated from the following:   Height as of this encounter: 5' 5"  (1.651 m).   Weight as of this encounter: 104 lb (47.2 kg).  BMI interpretation table: BMI level Category Range association with higher incidence of chronic pain  <18 kg/m2 Underweight   18.5-24.9 kg/m2 Ideal body weight   25-29.9 kg/m2 Overweight Increased incidence by 20%  30-34.9 kg/m2 Obese (Class I) Increased incidence by 68%  35-39.9 kg/m2 Severe obesity (Class II) Increased incidence by 136%  >40 kg/m2 Extreme obesity (Class III) Increased incidence by 254%   Patient's current BMI Ideal Body weight  Body mass index is 17.31 kg/m. Ideal body weight: 57 kg (125 lb 10.6 oz)   BMI Readings from  Last 4 Encounters:  03/21/18 17.31 kg/m  03/01/18 17.31 kg/m  02/15/18 18.30 kg/m  01/05/18 18.30 kg/m   Wt Readings from Last 4 Encounters:  03/21/18 104 lb (47.2 kg)  03/01/18 104 lb (47.2 kg)  02/15/18 110 lb (49.9 kg)  01/05/18 110 lb (49.9 kg)  Psych/Mental status: Alert, oriented x 3 (person, place, & time)       Eyes: PERLA Respiratory: No evidence of acute respiratory distress  Cervical Spine Area Exam  Skin & Axial Inspection: No masses, redness, edema, swelling, or associated skin lesions Alignment: Symmetrical Functional ROM: Unrestricted ROM      Stability: No instability detected Muscle Tone/Strength: Functionally intact. No obvious neuro-muscular anomalies detected. Sensory (Neurological): Unimpaired Palpation: No palpable anomalies              Upper Extremity (UE) Exam    Side: Right upper extremity  Side: Left upper extremity  Skin & Extremity Inspection: Skin color, temperature, and hair growth are WNL. No peripheral edema or cyanosis. No masses, redness, swelling, asymmetry, or associated skin lesions. No contractures.  Skin & Extremity Inspection: Skin color, temperature, and hair growth are WNL. No peripheral edema or cyanosis. No masses, redness, swelling, asymmetry, or associated skin lesions. No contractures.  Functional ROM: Unrestricted ROM          Functional ROM: Unrestricted ROM          Muscle Tone/Strength: Functionally intact. No obvious neuro-muscular anomalies detected.  Muscle Tone/Strength: Functionally intact. No obvious neuro-muscular anomalies detected.  Sensory (Neurological): Unimpaired          Sensory (Neurological): Unimpaired          Palpation: No palpable anomalies              Palpation: No palpable anomalies              Provocative Test(s):  Phalen's test: deferred Tinel's test: deferred Apley's scratch test (touch opposite shoulder):  Action 1 (Across chest): deferred Action 2 (Overhead): deferred Action 3 (LB reach):  deferred   Provocative Test(s):  Phalen's test: deferred Tinel's test: deferred Apley's scratch test (touch opposite shoulder):  Action 1 (Across chest): deferred Action 2 (Overhead): deferred Action 3 (LB reach): deferred    Thoracic Spine Area Exam  Skin & Axial Inspection: prominent thoracic Kyphosis Alignment: Asymmetric Functional ROM: Minimal ROM Stability: No instability detected Muscle Tone/Strength:  Functionally intact. No obvious neuro-muscular anomalies detected. Sensory (Neurological): Hypoesthesia/Hypesthesia (Reduced sensation to touch) Muscle strength & Tone: No palpable anomalies  Lumbar Spine Area Exam  Skin & Axial Inspection: No masses, redness, or swelling Alignment: Asymmetric Functional ROM: Minimal ROM       Stability: No instability detected Muscle Tone/Strength: Functionally intact. No obvious neuro-muscular anomalies detected. Sensory (Neurological): Unimpaired Palpation: No palpable anomalies       Provocative Tests: Hyperextension/rotation test: deferred today       Lumbar quadrant test (Kemp's test): deferred today       Lateral bending test: deferred today       Patrick's Maneuver: deferred today                   FABER* test: deferred today                   S-I anterior distraction/compression test: deferred today         S-I lateral compression test: deferred today         S-I Thigh-thrust test: deferred today         S-I Gaenslen's test: deferred today         *(Flexion, ABduction and External Rotation)  Gait & Posture Assessment  Ambulation: Patient came in today in a wheel chair Gait: Significantly limited. Dependent on assistive device to ambulate Posture: Difficulty with positional changes   Lower Extremity Exam    Side: Right lower extremity  Side: Left lower extremity  Stability: No instability observed          Stability: No instability observed          Skin & Extremity Inspection: Skin color, temperature, and hair growth are  WNL. No peripheral edema or cyanosis. No masses, redness, swelling, asymmetry, or associated skin lesions. No contractures.  Skin & Extremity Inspection: Skin color, temperature, and hair growth are WNL. No peripheral edema or cyanosis. No masses, redness, swelling, asymmetry, or associated skin lesions. No contractures.  Functional ROM: Unrestricted ROM                  Functional ROM: Unrestricted ROM                  Muscle Tone/Strength: Functionally intact. No obvious neuro-muscular anomalies detected.  Muscle Tone/Strength: Functionally intact. No obvious neuro-muscular anomalies detected.  Sensory (Neurological): Unimpaired        Sensory (Neurological): Unimpaired        DTR: Patellar: deferred today Achilles: deferred today Plantar: deferred today  DTR: Patellar: deferred today Achilles: deferred today Plantar: deferred today  Palpation: No palpable anomalies  Palpation: No palpable anomalies   Assessment & Plan  Primary Diagnosis & Pertinent Problem List: The primary encounter diagnosis was Chronic pain syndrome. Diagnoses of Chronic upper back pain (Primary Area of Pain) (Bilateral) (R>L), Thoracic compression fracture, sequela (T5, T9, T11, and T12), Chronic low back pain (Secondary Area of Pain) (Bilateral) (R>L) w/o sciatica, Lumbar compression fractures, sequela (L1, L2, L3, L4, and L5), Closed compression fracture of T5 thoracic vertebra, sequela, Closed compression fracture of T9 thoracic vertebra, sequela, Close compression fracture of T11 thoracic vertebra, sequela, Closed compression fracture of T12 thoracic vertebra, sequela, Closed compression fracture of L1 lumbar vertebra, sequela, Closed compression fracture of L2 lumbar vertebra, sequela, Closed compression fracture of L3 lumbar vertebra, sequela, Closed compression fracture of L4 lumbar vertebra, sequela, Closed compression fracture of L5 lumbar vertebra, sequela, History of kyphoplasty (L1, L2, L3, T9,  T11, and T12), DDD  (degenerative disc disease), lumbar, DDD (degenerative disc disease), thoracic, Lumbar facet hypertrophy, Grade 1  Lumbar Anterolisthesis of L3/4 and L4/5, Lumbar central spinal stenosis (Multilevel), 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 claudication, History of pelvic fracture, Chronic musculoskeletal pain, Neurogenic pain, Abnormal MRI, lumbar spine (12/15/2016), Disorder of skeletal system, Pharmacologic therapy, Long term current use of opiate analgesic, Long term prescription benzodiazepine use, Chronic anticoagulation (ELIQUIS), Problems influencing health status, Paroxysmal atrial fibrillation ([place]), Adult bronchiectasis ([place]), Protein-calorie malnutrition, severe, and Non-ischemic cardiomyopathy ([place]) were also pertinent to this visit.  Visit Diagnosis: 1. Chronic pain syndrome   2. Chronic upper back pain (Primary Area of Pain) (Bilateral) (R>L)   3. Thoracic compression fracture, sequela (T5, T9, T11, and T12)   4. Chronic low back pain (Secondary Area of Pain) (Bilateral) (R>L) w/o sciatica   5. Lumbar compression fractures, sequela (L1, L2, L3, L4, and L5)   6. Closed compression fracture of T5 thoracic vertebra, sequela   7. Closed compression fracture of T9 thoracic vertebra, sequela   8. Close compression fracture of T11 thoracic vertebra, sequela   9. Closed compression fracture of T12 thoracic vertebra, sequela   10. Closed compression fracture of L1 lumbar vertebra, sequela   11. Closed compression fracture of L2 lumbar vertebra, sequela   12. Closed compression fracture of L3 lumbar vertebra, sequela   13. Closed compression fracture of L4 lumbar vertebra, sequela   14. Closed compression fracture of L5 lumbar vertebra, sequela   15. History of kyphoplasty (L1, L2, L3, T9, T11, and T12)   16. DDD (degenerative disc disease), lumbar   17. DDD (degenerative disc disease), thoracic   18. Lumbar facet hypertrophy   19. Grade 1  Lumbar Anterolisthesis of L3/4 and L4/5   20. Lumbar central spinal  stenosis (Multilevel), 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 claudication   21. History of pelvic fracture   22. Chronic musculoskeletal pain   23. Neurogenic pain   24. Abnormal MRI, lumbar spine (12/15/2016)   25. Disorder of skeletal system   26. Pharmacologic therapy   27. Long term current use of opiate analgesic   28. Long term prescription benzodiazepine use   29. Chronic anticoagulation (ELIQUIS)   30. Problems influencing health status   31. Paroxysmal atrial fibrillation (HCC)   32. Adult bronchiectasis ([place])   33. Protein-calorie malnutrition, severe   34. Non-ischemic cardiomyopathy ([place])    Problems updated and reviewed during this visit: Problem  Abnormal MRI, lumbar spine (12/15/2016)   FINDINGS: Segmentation:  Standard Alignment: 4 mm anterolisthesis L4-5, facet mediated. 1-2 mm anterolisthesis L3-4, facet mediated. Vertebrae: Recent vertebral augmentation L2 and L3. New linear acute bone marrow edema L1, minor endplate depression, without significant wedging. No retropulsion. Tiny anterior inferior L1 corner fracture appears nondisplaced, see series 7, image 7. Remote compression deformities L4 and L5. Hemangioma L1. Conus medullaris and cauda equina: Conus extends to the L1 level. Conus and cauda equina appear normal. Paraspinal and other soft tissues: Unchanged LEFT renal cyst.  Disc levels:  L1-2: Annular bulging accompanies retropulsion of L2. Facet arthropathy. Mild stenosis, not significantly increased from priors. L2-3: Annular bulge, minimal retropulsion of L3, mild facet arthropathy. Mild stenosis, no impingement. L3-4: Remote compression fracture of L4. Annular bulging. 1-2 mm retrolisthesis. Posterior element hypertrophy. Mild stenosis. No impingement. L4-L5: 4 mm anterolisthesis. Uncovering of the disc with annular bulge. Remote compression fracture L5, minimal retropulsion. Facet arthropathy and ligamentum flavum hypertrophy. Mild to moderate stenosis. BILATERAL L5 neural  impingement is possible. L5-S1:  Annular bulge.  Facet arthropathy.  No impingement.  IMPRESSION: Minor compression fracture L1, minimal endplate deformity with acute bone marrow edema along the inferior aspect, not present 11/25/2017. No retropulsion.  Status post vertebral augmentation L2 and L3, with no adverse features.  Stable multilevel spondylosis, with mild stenosis L4-5.  Electronically Signed   By: Staci Righter M.D.   On: 12/15/2017 10:02    Lumbar compression fractures, sequela (L1, L2, L3, L4, and L5)  Closed Compression Fracture of L1 Lumbar Vertebra, Sequela  Closed Compression Fracture of L2 Lumbar Vertebra, Sequela  Closed Compression Fracture of L3 Lumbar Vertebra, Sequela  Closed Compression Fracture of L4 Lumbar Vertebra, Sequela  Closed Compression Fracture of L5 Lumbar Vertebra, Sequela  Thoracic compression fracture, sequela (T5, T9, T11, and T12)  Closed compression fracture of T5 thoracic vertebra, sequela  Closed compression fracture of T9 thoracic vertebra, sequela  Close compression fracture of T11 thoracic vertebra, sequela  Closed compression fracture of T12 thoracic vertebra, sequela  Lumbar facet hypertrophy  Grade 1  Lumbar Anterolisthesis of L3/4 and L4/5  Lumbar central spinal stenosis (Multilevel), 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 claudication  History of Pelvic Fracture  Chronic Musculoskeletal Pain  Neurogenic Pain  Ddd (Degenerative Disc Disease), Thoracic  Migraines  Osteoporosis, Post-Menopausal  Chronic upper back pain (Primary Area of Pain) (Bilateral) (R>L)  Chronic low back pain (Secondary Area of Pain) (Bilateral) (R>L) w/o sciatica  Chronic Pain Syndrome  History of kyphoplasty (L1, L2, L3, T9, T11, and T12)  Pelvic Fracture (Hcc)  Osteoarthritis of knees (Bilateral)   [facility] ultrasound done on 10/03/13 showed a 3-4 cm loculated Baker cyst in the posterior left knee   Primary Localized Osteoarthrosis, Lower Leg   Overview:  Overview:  [facility]  ultrasound done on 10/03/13 showed a 3-4 cm loculated Baker cyst in the posterior left knee  Last Assessment & Plan:  Osteoarthritis of bilateral knees, with left knee pain. Patient on patient's clinical history, evidence of Baker cyst, and stable knee exam I suspect the majority for intermittent pain is from osteoarthritis.  Recommendations: -Advised patient that standing x-rays would be beneficial to further classify the degree of arthritis in her knee. -Patient only has mild to moderate arthritis we can consider a series of viscous supplementation injections to help with inflammation and pain. -Educated patient that if she has severe arthritis physical supplementation will likely not be clinically helpful. Further options for treatment with severe arthritis will be intermittent steroid injections every 3-6 months with no more than 3 a year versus referral to orthopedic surgeon for consideration of knee replacement.  - Patient is not interested in surgical intervention at this time would like to continue to treat with conservative management - Educated patient to treat intermittent pain and swelling with compression knee sleeve, topical Voltaren gel, and intermittent short courses of Aleve. We'll follow-up with patient over the phone with x-ray results and further planning for clinical management   Ddd (Degenerative Disc Disease), Lumbar   Overview:  MRI of lumbar spine without contrast on 02/24/12 shows mild to moderate L4 and L5 compression fractures and degenerate changes throughout the lumbar spine   Chronic anticoagulation (ELIQUIS)  Long Term Prescription Benzodiazepine Use  Mycobacterium Avium-Intracellulare Complex (Hcc)  Long Term Current Use of Opiate Analgesic  Pharmacologic Therapy  Disorder of Skeletal System  Problems Influencing Health Status  Paroxysmal Atrial Fibrillation (Hcc)  Adult Bronchiectasis (Hcc)  Diverticulitis  Ischemic Colitis (Hcc)   Overview:  and dumping  syndrome   Psoriasis  Protein-calorie malnutrition, severe  Gerd (Gastroesophageal Reflux Disease)  Other Dysphagia  Unintended Weight Loss  Essential Hypertension  Non-Ischemic Cardiomyopathy (Hcc)  Coronary Artery Disease Involving Native Coronary Artery of Native Heart Without Angina Pectoris   Overview:  1. 2007 NSTEMI 2. 04/18/2005 cath revealed normal coronaries 3. 06/01/2009 normal stress echo at 10 METs, EF>55%   Hyperlipidemia, Mixed    Plan of Care  Pharmacotherapy (Medications Ordered): No orders of the defined types were placed in this encounter.   Procedure Orders     Thoracic Epidural Injection Lab Orders  No laboratory test(s) ordered today    Imaging Orders     DG Lumbar Spine Complete W/Bend     DG Thoracic Spine 2 View Referral Orders  No referral(s) requested today   Pharmacological management options:  Opioid Analgesics: We'll take over management today. See above orders Membrane stabilizer: We have discussed the possibility of optimizing this mode of therapy, if tolerated Muscle relaxant: We have discussed the possibility of a trial NSAID: We have discussed the possibility of a trial Other analgesic(s): To be determined at a later time   Interventional management options: Planned, scheduled, and/or pending:    Diagnostic midline thoracic ESI #1    Considering:   Diagnostic midline thoracic ESI  Diagnostic midline LESI  Diagnostic bilateral lumbar facet nerve block  Possible bilateral lumbar facet RFA  Possible candidate for intrathecal pump trial and implant    PRN Procedures:   None at this time   Provider-requested follow-up: Return for Procedure (w/ sedation): (ML) Thoracic ESI #1, (Blood-thinner Protocol).  No future appointments.  Primary Care Physician: [name], MD Location: [facility] Note by: Gaspar Cola, MD Date: 03/21/2018; Time: 1:25 PM

## 2018-03-21 NOTE — Progress Notes (Signed)
Safety precautions to be maintained throughout the outpatient stay will include: orient to surroundings, keep bed in low position, maintain call bell within reach at all times, provide assistance with transfer out of bed and ambulation.  

## 2018-03-21 NOTE — Patient Instructions (Addendum)
____________________________________________________________________________________________  Preparing for Procedure with Sedation  Instructions: . Oral Intake: Do not eat or drink anything for at least 8 hours prior to your procedure. . Transportation: Public transportation is not allowed. Bring an adult driver. The driver must be physically present in our waiting room before any procedure can be started. . Physical Assistance: Bring an adult physically capable of assisting you, in the event you need help. This adult should keep you company at home for at least 6 hours after the procedure. . Blood Pressure Medicine: Take your blood pressure medicine with a sip of water the morning of the procedure. . Blood thinners: Notify our staff if you are taking any blood thinners. Depending on which one you take, there will be specific instructions on how and when to stop it. . Diabetics on insulin: Notify the staff so that you can be scheduled 1st case in the morning. If your diabetes requires high dose insulin, take only  of your normal insulin dose the morning of the procedure and notify the staff that you have done so. . Preventing infections: Shower with an antibacterial soap the morning of your procedure. . Build-up your immune system: Take 1000 mg of Vitamin C with every meal (3 times a day) the day prior to your procedure. . Antibiotics: Inform the staff if you have a condition or reason that requires you to take antibiotics before dental procedures. . Pregnancy: If you are pregnant, call and cancel the procedure. . Sickness: If you have a cold, fever, or any active infections, call and cancel the procedure. . Arrival: You must be in the facility at least 30 minutes prior to your scheduled procedure. . Children: Do not bring children with you. . Dress appropriately: Bring dark clothing that you would not mind if they get stained. . Valuables: Do not bring any jewelry or valuables.  Procedure  appointments are reserved for interventional treatments only. . No Prescription Refills. . No medication changes will be discussed during procedure appointments. . No disability issues will be discussed.  Reasons to call and reschedule or cancel your procedure: (Following these recommendations will minimize the risk of a serious complication.) . Surgeries: Avoid having procedures within 2 weeks of any surgery. (Avoid for 2 weeks before or after any surgery). . Flu Shots: Avoid having procedures within 2 weeks of a flu shots or . (Avoid for 2 weeks before or after immunizations). . Barium: Avoid having a procedure within 7-10 days after having had a radiological study involving the use of radiological contrast. (Myelograms, Barium swallow or enema study). . Heart attacks: Avoid any elective procedures or surgeries for the initial 6 months after a "Myocardial Infarction" (Heart Attack). . Blood thinners: It is imperative that you stop these medications before procedures. Let us know if you if you take any blood thinner.  . Infection: Avoid procedures during or within two weeks of an infection (including chest colds or gastrointestinal problems). Symptoms associated with infections include: Localized redness, fever, chills, night sweats or profuse sweating, burning sensation when voiding, cough, congestion, stuffiness, runny nose, sore throat, diarrhea, nausea, vomiting, cold or Flu symptoms, recent or current infections. It is specially important if the infection is over the area that we intend to treat. . Heart and lung problems: Symptoms that may suggest an active cardiopulmonary problem include: cough, chest pain, breathing difficulties or shortness of breath, dizziness, ankle swelling, uncontrolled high or unusually low blood pressure, and/or palpitations. If you are experiencing any of these symptoms, cancel   your procedure and contact your primary care physician for an evaluation.  Remember:   Regular Business hours are:  Monday to Thursday 8:00 AM to 4:00 PM  Provider's Schedule: Daxtin Leiker, MD:  Procedure days: Tuesday and Thursday 7:30 AM to 4:00 PM  Bilal Lateef, MD:  Procedure days: Monday and Wednesday 7:30 AM to 4:00 PM ____________________________________________________________________________________________   ____________________________________________________________________________________________  General Risks and Possible Complications  Patient Responsibilities: It is important that you read this as it is part of your informed consent. It is our duty to inform you of the risks and possible complications associated with treatments offered to you. It is your responsibility as a patient to read this and to ask questions about anything that is not clear or that you believe was not covered in this document.  Patient's Rights: You have the right to refuse treatment. You also have the right to change your mind, even after initially having agreed to have the treatment done. However, under this last option, if you wait until the last second to change your mind, you may be charged for the materials used up to that point.  Introduction: Medicine is not an exact science. Everything in Medicine, including the lack of treatment(s), carries the potential for danger, harm, or loss (which is by definition: Risk). In Medicine, a complication is a secondary problem, condition, or disease that can aggravate an already existing one. All treatments carry the risk of possible complications. The fact that a side effects or complications occurs, does not imply that the treatment was conducted incorrectly. It must be clearly understood that these can happen even when everything is done following the highest safety standards.  No treatment: You can choose not to proceed with the proposed treatment alternative. The "PRO(s)" would include: avoiding the risk of complications associated  with the therapy. The "CON(s)" would include: not getting any of the treatment benefits. These benefits fall under one of three categories: diagnostic; therapeutic; and/or palliative. Diagnostic benefits include: getting information which can ultimately lead to improvement of the disease or symptom(s). Therapeutic benefits are those associated with the successful treatment of the disease. Finally, palliative benefits are those related to the decrease of the primary symptoms, without necessarily curing the condition (example: decreasing the pain from a flare-up of a chronic condition, such as incurable terminal cancer).  General Risks and Complications: These are associated to most interventional treatments. They can occur alone, or in combination. They fall under one of the following six (6) categories: no benefit or worsening of symptoms; bleeding; infection; nerve damage; allergic reactions; and/or death. 1. No benefits or worsening of symptoms: In Medicine there are no guarantees, only probabilities. No healthcare provider can ever guarantee that a medical treatment will work, they can only state the probability that it may. Furthermore, there is always the possibility that the condition may worsen, either directly, or indirectly, as a consequence of the treatment. 2. Bleeding: This is more common if the patient is taking a blood thinner, either prescription or over the counter (example: Goody Powders, Fish oil, Aspirin, Garlic, etc.), or if suffering a condition associated with impaired coagulation (example: Hemophilia, cirrhosis of the liver, low platelet counts, etc.). However, even if you do not have one on these, it can still happen. If you have any of these conditions, or take one of these drugs, make sure to notify your treating physician. 3. Infection: This is more common in patients with a compromised immune system, either due to disease (example: diabetes, cancer,   human immunodeficiency virus  [HIV], etc.), or due to medications or treatments (example: therapies used to treat cancer and rheumatological diseases). However, even if you do not have one on these, it can still happen. If you have any of these conditions, or take one of these drugs, make sure to notify your treating physician. 4. Nerve Damage: This is more common when the treatment is an invasive one, but it can also happen with the use of medications, such as those used in the treatment of cancer. The damage can occur to small secondary nerves, or to large primary ones, such as those in the spinal cord and brain. This damage may be temporary or permanent and it may lead to impairments that can range from temporary numbness to permanent paralysis and/or brain death. 5. Allergic Reactions: Any time a substance or material comes in contact with our body, there is the possibility of an allergic reaction. These can range from a mild skin rash (contact dermatitis) to a severe systemic reaction (anaphylactic reaction), which can result in death. 6. Death: In general, any medical intervention can result in death, most of the time due to an unforeseen complication. ____________________________________________________________________________________________   ____________________________________________________________________________________________  Blood Thinners  Recommended Time Interval Before and After Neuraxial Block or Catheter Removal  Drug (Generic) Brand Name Time Before Time After Comments  Abciximab Reopro 15 days 2 hours   Alteplase Activase 10 days 10 days   Apixaban Eliquis 3 days 6 hours   Aspirin > 325 mg Goody Powders/Excedrin 11 days  (Usually not stopped)  Aspirin ? 81 mg  7 days  (Usually not stopped)  Cholesterol Medication Lipitor 4 days    Cilostazol Pletal 3 days 5 hours   Clopidogrel Plavix 7-10 days 2 hours   Dabigatran Pradaxa 5 days 6 hours   Delteparin Fragmin 24 hours 4 hours   Dipyridamole + ASA  Aggrenox 11days 2 hours   Enoxaparin  Lovenox 24 hours 4 hours   Eptifibatide Integrillin 8 hours 2 hours   Fish oil  4 days    Fondaparinux  Arixtra 72 hours 12 hours   Garlic supplements  7 days    Ginkgo biloba  36 hours    Ginseng  24 hours    Heparin (IV)  4 hours 2 hours   Heparin ([place])  12 hours 2 hours   Hydroxychloroquine Plaquenil 11 days    LMW Heparin  24 hours    LMWH  24 hours    NSAIDs  3 days  (Usually not stopped)  Prasugrel Effient 7-10 days 6 hours   Reteplase Retavase 10 days 10 days   Rivaroxaban Xarelto 3 days 6 hours   Streptokinase Streptase 10 days 10 days   Tenecteplase TNKase 10 days 10 days   Thrombolytics  10 days  10 days Avoid x 10 days after inj.  Ticagrelor Brilinta 5-7 days 6 hours   Ticlodipine Ticlid 10-14 days 2 hours   Tinzaparin Innohep 24 hours 4 hours   Tirofiban Aggrastat 8 hours 2 hours   Vitamin E  4 days    Warfarin Coumadin 5 days 2 hours   ____________________________________________________________________________________________    Pain Management Discharge Instructions  General Discharge Instructions :  If you need to reach your doctor call: Monday-Friday 8:00 am - 4:00 pm at [phone] or toll free [phone].  After clinic hours [phone] to have operator reach doctor.  Bring all of your medication bottles to all your appointments in the pain clinic.  To cancel or  reschedule your appointment with Pain Management please remember to call 24 hours in advance to avoid a fee.  Refer to the educational materials which you have been given on: General Risks, I had my Procedure. Discharge Instructions, Post Sedation.  Post Procedure Instructions:  The drugs you were given will stay in your system until tomorrow, so for the next 24 hours you should not drive, make any legal decisions or drink any alcoholic beverages.  You may eat anything you prefer, but it is better to start with liquids then soups and crackers, and  gradually work up to solid foods.  Please notify your doctor immediately if you have any unusual bleeding, trouble breathing or pain that is not related to your normal pain.  Depending on the type of procedure that was done, some parts of your body may feel week and/or numb.  This usually clears up by tonight or the next day.  Walk with the use of an assistive device or accompanied by an adult for the 24 hours.  You may use ice on the affected area for the first 24 hours.  Put ice in a Ziploc bag and cover with a towel and place against area 15 minutes on 15 minutes off.  You may switch to heat after 24 hours.GENERAL RISKS AND COMPLICATIONS  What are the risk, side effects and possible complications? Generally speaking, most procedures are safe.  However, with any procedure there are risks, side effects, and the possibility of complications.  The risks and complications are dependent upon the sites that are lesioned, or the type of nerve block to be performed.  The closer the procedure is to the spine, the more serious the risks are.  Great care is taken when placing the radio frequency needles, block needles or lesioning probes, but sometimes complications can occur. 1. Infection: Any time there is an injection through the skin, there is a risk of infection.  This is why sterile conditions are used for these blocks.  There are four possible types of infection. 1. Localized skin infection. 2. Central Nervous System Infection-This can be in the form of Meningitis, which can be deadly. 3. Epidural Infections-This can be in the form of an epidural abscess, which can cause pressure inside of the spine, causing compression of the spinal cord with subsequent paralysis. This would require an emergency surgery to decompress, and there are no guarantees that the patient would recover from the paralysis. 4. Discitis-This is an infection of the intervertebral discs.  It occurs in about 1% of discography  procedures.  It is difficult to treat and it may lead to surgery.        2. Pain: the needles have to go through skin and soft tissues, will cause soreness.       3. Damage to internal structures:  The nerves to be lesioned may be near blood vessels or    other nerves which can be potentially damaged.       4. Bleeding: Bleeding is more common if the patient is taking blood thinners such as  aspirin, Coumadin, Ticiid, Plavix, etc., or if he/she have some genetic predisposition  such as hemophilia. Bleeding into the spinal canal can cause compression of the spinal  cord with subsequent paralysis.  This would require an emergency surgery to  decompress and there are no guarantees that the patient would recover from the  paralysis.       5. Pneumothorax:  Puncturing of a lung is a possibility, every  time a needle is introduced in  the area of the chest or upper back.  Pneumothorax refers to free air around the  collapsed lung(s), inside of the thoracic cavity (chest cavity).  Another two possible  complications related to a similar event would include: Hemothorax and Chylothorax.   These are variations of the Pneumothorax, where instead of air around the collapsed  lung(s), you may have blood or chyle, respectively.       6. Spinal headaches: They may occur with any procedures in the area of the spine.       7. Persistent CSF (Cerebro-Spinal Fluid) leakage: This is a rare problem, but may occur  with prolonged intrathecal or epidural catheters either due to the formation of a fistulous  track or a dural tear.       8. Nerve damage: By working so close to the spinal cord, there is always a possibility of  nerve damage, which could be as serious as a permanent spinal cord injury with  paralysis.       9. Death:  Although rare, severe deadly allergic reactions known as "Anaphylactic  reaction" can occur to any of the medications used.      10. Worsening of the symptoms:  We can always make thing worse.  What  are the chances of something like this happening? Chances of any of this occuring are extremely low.  By statistics, you have more of a chance of getting killed in a motor vehicle accident: while driving to the hospital than any of the above occurring .  Nevertheless, you should be aware that they are possibilities.  In general, it is similar to taking a shower.  Everybody knows that you can slip, hit your head and get killed.  Does that mean that you should not shower again?  Nevertheless always keep in mind that statistics do not mean anything if you happen to be on the wrong side of them.  Even if a procedure has a 1 (one) in a 1,000,000 (million) chance of going wrong, it you happen to be that one..Also, keep in mind that by statistics, you have more of a chance of having something go wrong when taking medications.  Who should not have this procedure? If you are on a blood thinning medication (e.g. Coumadin, Plavix, see list of "Blood Thinners"), or if you have an active infection going on, you should not have the procedure.  If you are taking any blood thinners, please inform your physician.  How should I prepare for this procedure?  Do not eat or drink anything at least six hours prior to the procedure.  Bring a driver with you .  It cannot be a taxi.  Come accompanied by an adult that can drive you back, and that is strong enough to help you if your legs get weak or numb from the local anesthetic.  Take all of your medicines the morning of the procedure with just enough water to swallow them.  If you have diabetes, make sure that you are scheduled to have your procedure done first thing in the morning, whenever possible.  If you have diabetes, take only half of your insulin dose and notify our nurse that you have done so as soon as you arrive at the clinic.  If you are diabetic, but only take blood sugar pills (oral hypoglycemic), then do not take them on the morning of your procedure.   You may take them after you have had the  procedure.  Do not take aspirin or any aspirin-containing medications, at least eleven (11) days prior to the procedure.  They may prolong bleeding.  Wear loose fitting clothing that may be easy to take off and that you would not mind if it got stained with Betadine or blood.  Do not wear any jewelry or perfume  Remove any nail coloring.  It will interfere with some of our monitoring equipment.  NOTE: Remember that this is not meant to be interpreted as a complete list of all possible complications.  Unforeseen problems may occur.  BLOOD THINNERS The following drugs contain aspirin or other products, which can cause increased bleeding during surgery and should not be taken for 2 weeks prior to and 1 week after surgery.  If you should need take something for relief of minor pain, you may take acetaminophen which is found in Tylenol,m Datril, Anacin-3 and Panadol. It is not blood thinner. The products listed below are.  Do not take any of the products listed below in addition to any listed on your instruction sheet.  A.P.C or A.P.C with Codeine Codeine Phosphate Capsules #3 Ibuprofen Ridaura  ABC compound Congesprin Imuran rimadil  Advil Cope Indocin Robaxisal  Alka-Seltzer Effervescent Pain Reliever and Antacid Coricidin or Coricidin-D  Indomethacin Rufen  Alka-Seltzer plus Cold Medicine Cosprin Ketoprofen S-A-C Tablets  Anacin Analgesic Tablets or Capsules Coumadin Korlgesic Salflex  Anacin Extra Strength Analgesic tablets or capsules CP-2 Tablets Lanoril Salicylate  Anaprox Cuprimine Capsules Levenox Salocol  Anexsia-D Dalteparin Magan Salsalate  Anodynos Darvon compound Magnesium Salicylate Sine-off  Ansaid Dasin Capsules Magsal Sodium Salicylate  Anturane Depen Capsules Marnal Soma  APF Arthritis pain formula Dewitt's Pills Measurin Stanback  Argesic Dia-Gesic Meclofenamic Sulfinpyrazone  Arthritis Bayer Timed Release Aspirin Diclofenac  Meclomen Sulindac  Arthritis pain formula Anacin Dicumarol Medipren Supac  Analgesic (Safety coated) Arthralgen Diffunasal Mefanamic Suprofen  Arthritis Strength Bufferin Dihydrocodeine Mepro Compound Suprol  Arthropan liquid Dopirydamole Methcarbomol with Aspirin Synalgos  ASA tablets/Enseals Disalcid Micrainin Tagament  Ascriptin Doan's Midol Talwin  Ascriptin A/D Dolene Mobidin Tanderil  Ascriptin Extra Strength Dolobid Moblgesic Ticlid  Ascriptin with Codeine Doloprin or Doloprin with Codeine Momentum Tolectin  Asperbuf Duoprin Mono-gesic Trendar  Aspergum Duradyne Motrin or Motrin IB Triminicin  Aspirin plain, buffered or enteric coated Durasal Myochrisine Trigesic  Aspirin Suppositories Easprin Nalfon Trillsate  Aspirin with Codeine Ecotrin Regular or Extra Strength Naprosyn Uracel  Atromid-S Efficin Naproxen Ursinus  Auranofin Capsules Elmiron Neocylate Vanquish  Axotal Emagrin Norgesic Verin  Azathioprine Empirin or Empirin with Codeine Normiflo Vitamin E  Azolid Emprazil Nuprin Voltaren  Bayer Aspirin plain, buffered or children's or timed BC Tablets or powders Encaprin Orgaran Warfarin Sodium  Buff-a-Comp Enoxaparin Orudis Zorpin  Buff-a-Comp with Codeine Equegesic Os-Cal-Gesic   Buffaprin Excedrin plain, buffered or Extra Strength Oxalid   Bufferin Arthritis Strength Feldene Oxphenbutazone   Bufferin plain or Extra Strength Feldene Capsules Oxycodone with Aspirin   Bufferin with Codeine Fenoprofen Fenoprofen Pabalate or Pabalate-SF   Buffets II Flogesic Panagesic   Buffinol plain or Extra Strength Florinal or Florinal with Codeine Panwarfarin   Buf-Tabs Flurbiprofen Penicillamine   Butalbital Compound Four-way cold tablets Penicillin   Butazolidin Fragmin Pepto-Bismol   Carbenicillin Geminisyn Percodan   Carna Arthritis Reliever Geopen Persantine   Carprofen Gold's salt Persistin   Chloramphenicol Goody's Phenylbutazone   Chloromycetin Haltrain Piroxlcam   Clmetidine  heparin Plaquenil   Cllnoril Hyco-pap Ponstel   Clofibrate Hydroxy chloroquine Propoxyphen  Before stopping any of these medications, be sure to consult the physician who ordered them.  Some, such as Coumadin (Warfarin) are ordered to prevent or treat serious conditions such as "deep thrombosis", "pumonary embolisms", and other heart problems.  The amount of time that you may need off of the medication may also vary with the medication and the reason for which you were taking it.  If you are taking any of these medications, please make sure you notify your pain physician before you undergo any procedures.         Epidural Steroid Injection Patient Information  Description: The epidural space surrounds the nerves as they exit the spinal cord.  In some patients, the nerves can be compressed and inflamed by a bulging disc or a tight spinal canal (spinal stenosis).  By injecting steroids into the epidural space, we can bring irritated nerves into direct contact with a potentially helpful medication.  These steroids act directly on the irritated nerves and can reduce swelling and inflammation which often leads to decreased pain.  Epidural steroids may be injected anywhere along the spine and from the neck to the low back depending upon the location of your pain.   After numbing the skin with local anesthetic (like Novocaine), a small needle is passed into the epidural space slowly.  You may experience a sensation of pressure while this is being done.  The entire block usually last less than 10 minutes.  Conditions which may be treated by epidural steroids:   Low back and leg pain  Neck and arm pain  Spinal stenosis  Post-laminectomy syndrome  Herpes zoster (shingles) pain  Pain from compression fractures  Preparation for the injection:  1. Do not eat any solid food or dairy products within 8 hours of your appointment.  2. You may drink clear liquids up to 3 hours before  appointment.  Clear liquids include water, black coffee, juice or soda.  No milk or cream please. 3. You may take your regular medication, including pain medications, with a sip of water before your appointment  Diabetics should hold regular insulin (if taken separately) and take 1/2 normal NPH dos the morning of the procedure.  Carry some sugar containing items with you to your appointment. 4. A driver must accompany you and be prepared to drive you home after your procedure.  5. Bring all your current medications with your. 6. An IV may be inserted and sedation may be given at the discretion of the physician.   7. A blood pressure cuff, EKG and other monitors will often be applied during the procedure.  Some patients may need to have extra oxygen administered for a short period. 8. You will be asked to provide medical information, including your allergies, prior to the procedure.  We must know immediately if you are taking blood thinners (like Coumadin/Warfarin)  Or if you are allergic to IV iodine contrast (dye). We must know if you could possible be pregnant.  Possible side-effects:  Bleeding from needle site  Infection (rare, may require surgery)  Nerve injury (rare)  Numbness & tingling (temporary)  Difficulty urinating (rare, temporary)  Spinal headache ( a headache worse with upright posture)  Light -headedness (temporary)  Pain at injection site (several days)  Decreased blood pressure (temporary)  Weakness in arm/leg (temporary)  Pressure sensation in back/neck (temporary)  Call if you experience:  Fever/chills associated with headache or increased back/neck pain.  Headache worsened by an upright position.  New onset weakness  or numbness of an extremity below the injection site  Hives or difficulty breathing (go to the emergency room)  Inflammation or drainage at the infection site  Severe back/neck pain  Any new symptoms which are concerning to you  Please  note:  Although the local anesthetic injected can often make your back or neck feel good for several hours after the injection, the pain will likely return.  It takes 3-7 days for steroids to work in the epidural space.  You may not notice any pain relief for at least that one week.  If effective, we will often do a series of three injections spaced 3-6 weeks apart to maximally decrease your pain.  After the initial series, we generally will wait several months before considering a repeat injection of the same type.  If you have any questions, please call [phone] [facility]

## 2018-03-23 ENCOUNTER — Telehealth: Payer: Self-pay

## 2018-03-23 NOTE — Telephone Encounter (Signed)
Patient notified that Dr Ubaldo Glassing gave his permission for her to stop her Eliquis for 3 days prior to the procedure.  Blanch Media, when you get it approved and call the patient to schedule, will you please remind her to stop her Eliquis for 3 days and schedule accordingly?  Thank you

## 2018-03-26 NOTE — Telephone Encounter (Signed)
Got prior auth, called patient to schedule. She had not taken her eliquis today so I put her on for Thursday

## 2018-03-29 ENCOUNTER — Encounter: Payer: Self-pay | Admitting: Pain Medicine

## 2018-03-29 ENCOUNTER — Ambulatory Visit
Admission: RE | Admit: 2018-03-29 | Discharge: 2018-03-29 | Disposition: A | Discharge status: Home / Self Care | Payer: Medicare Other | Source: Ambulatory Visit | Attending: Pain Medicine | Admitting: Pain Medicine

## 2018-03-29 ENCOUNTER — Ambulatory Visit (HOSPITAL_BASED_OUTPATIENT_CLINIC_OR_DEPARTMENT_OTHER): Payer: Medicare Other | Admitting: Pain Medicine

## 2018-03-29 ENCOUNTER — Other Ambulatory Visit: Payer: Self-pay

## 2018-03-29 VITALS — BP 148/93 | HR 109 | Temp 98.0°F | Resp 20 | Ht 65.0 in | Wt 104.0 lb

## 2018-03-29 DIAGNOSIS — M549 Dorsalgia, unspecified: Secondary | ICD-10-CM

## 2018-03-29 DIAGNOSIS — M5134 Other intervertebral disc degeneration, thoracic region: Secondary | ICD-10-CM | POA: Insufficient documentation

## 2018-03-29 DIAGNOSIS — G8929 Other chronic pain: Secondary | ICD-10-CM | POA: Insufficient documentation

## 2018-03-29 DIAGNOSIS — M5414 Radiculopathy, thoracic region: Secondary | ICD-10-CM | POA: Insufficient documentation

## 2018-03-29 DIAGNOSIS — Z7901 Long term (current) use of anticoagulants: Secondary | ICD-10-CM | POA: Diagnosis present

## 2018-03-29 DIAGNOSIS — M4854XS Collapsed vertebra, not elsewhere classified, thoracic region, sequela of fracture: Secondary | ICD-10-CM | POA: Diagnosis present

## 2018-03-29 DIAGNOSIS — S22000S Wedge compression fracture of unspecified thoracic vertebra, sequela: Secondary | ICD-10-CM

## 2018-03-29 DIAGNOSIS — R55 Syncope and collapse: Secondary | ICD-10-CM | POA: Diagnosis present

## 2018-03-29 LAB — GLUCOSE, CAPILLARY: GLUCOSE-CAPILLARY: 93 mg/dL (ref 70–99)

## 2018-03-29 MED ORDER — ONDANSETRON HCL 4 MG/2ML IJ SOLN
INTRAMUSCULAR | Status: AC
  Filled 2018-03-29: qty 2

## 2018-03-29 MED ORDER — IOPAMIDOL (ISOVUE-M 200) INJECTION 41%
10.0000 mL | Freq: Once | INTRAMUSCULAR | Status: AC
  Administered 2018-03-29: 10 mL via EPIDURAL
  Filled 2018-03-29: qty 10

## 2018-03-29 MED ORDER — GLYCOPYRROLATE 0.2 MG/ML IJ SOLN
INTRAMUSCULAR | Status: AC
  Filled 2018-03-29: qty 1

## 2018-03-29 MED ORDER — ONDANSETRON HCL 4 MG/2ML IJ SOLN
4.0000 mg | Freq: Once | INTRAMUSCULAR | Status: AC
  Administered 2018-03-29: 4 mg via INTRAVENOUS

## 2018-03-29 MED ORDER — GLYCOPYRROLATE 0.2 MG/ML IJ SOLN
0.2000 mg | Freq: Once | INTRAMUSCULAR | Status: AC
  Administered 2018-03-29: 0.2 mg via INTRAVENOUS

## 2018-03-29 MED ORDER — SODIUM CHLORIDE 0.9% FLUSH
2.0000 mL | Freq: Once | INTRAVENOUS | Status: AC
  Administered 2018-03-29: 1 mL

## 2018-03-29 MED ORDER — DEXAMETHASONE SODIUM PHOSPHATE 10 MG/ML IJ SOLN
10.0000 mg | Freq: Once | INTRAMUSCULAR | Status: AC
  Administered 2018-03-29: 10 mg
  Filled 2018-03-29: qty 1

## 2018-03-29 MED ORDER — LIDOCAINE HCL 2 % IJ SOLN
20.0000 mL | Freq: Once | INTRAMUSCULAR | Status: AC
  Administered 2018-03-29: 400 mg
  Filled 2018-03-29: qty 40

## 2018-03-29 MED ORDER — ROPIVACAINE HCL 2 MG/ML IJ SOLN
2.0000 mL | Freq: Once | INTRAMUSCULAR | Status: AC
  Administered 2018-03-29: 2 mL via EPIDURAL
  Filled 2018-03-29: qty 10

## 2018-03-29 NOTE — Patient Instructions (Addendum)
____________________________________________________________________________________________  Post-Procedure Discharge Instructions  Instructions:  Apply ice:   Purpose: This will minimize any swelling and discomfort after procedure.   When: Day of procedure, as soon as you get home.  How: Fill a plastic sandwich bag with crushed ice. Cover it with a small towel and apply to injection site.  How long: (15 min on, 15 min off) Apply for 15 minutes then remove x 15 minutes.  Repeat sequence on day of procedure, until you go to bed.  Apply heat:   Purpose: To treat any soreness and discomfort from the procedure.  When: Starting the next day after the procedure.  How: Apply heat to procedure site starting the day following the procedure.  How long: May continue to repeat daily, until discomfort goes away.  Food intake: Start with clear liquids (like water) and advance to regular food, as tolerated.   Physical activities: Keep activities to a minimum for the first 8 hours after the procedure. After that, then as tolerated.  Driving: If you have received any sedation, be responsible and do not drive. You are not allowed to drive for 24 hours after having sedation.  Blood thinner: (Applies only to those taking blood thinners) You may restart your blood thinner 6 hours after your procedure.  Insulin: (Applies only to Diabetic patients taking insulin) As soon as you can eat, you may resume your normal dosing schedule.  Infection prevention: Keep procedure site clean and dry. Shower daily and clean area with soap and water.  Post-procedure Pain Diary: Extremely important that this be done correctly and accurately. Recorded information will be used to determine the next step in treatment. For the purpose of accuracy, follow these rules:  Evaluate only the area treated. Do not report or include pain from an untreated area. For the purpose of this evaluation, ignore all other areas of pain,  except for the treated area.  After your procedure, avoid taking a long nap and attempting to complete the pain diary after you wake up. Instead, set your alarm clock to go off every hour, on the hour, for the initial 8 hours after the procedure. Document the duration of the numbing medicine, and the relief you are getting from it.  Do not go to sleep and attempt to complete it later. It will not be accurate. If you received sedation, it is likely that you were given a medication that may cause amnesia. Because of this, completing the diary at a later time may cause the information to be inaccurate. This information is needed to plan your care.  Follow-up appointment: Keep your post-procedure follow-up evaluation appointment after the procedure (usually 2 weeks for most procedures, 6 weeks for radiofrequencies). DO NOT FORGET to bring you pain diary with you.   Expect: (What should I expect to see with my procedure?)  From numbing medicine (AKA: Local Anesthetics): Numbness or decrease in pain. You may also experience some weakness, which if present, could last for the duration of the local anesthetic.  Onset: Full effect within 15 minutes of injected.  Duration: It will depend on the type of local anesthetic used. On the average, 1 to 8 hours.   From steroids (Applies only if steroids were used): Decrease in swelling or inflammation. Once inflammation is improved, relief of the pain will follow.  Onset of benefits: Depends on the amount of swelling present. The more swelling, the longer it will take for the benefits to be seen. In some cases, up to 10 days.    Duration: Steroids will stay in the system x 2 weeks. Duration of benefits will depend on multiple posibilities including persistent irritating factors.  Side-effects: If present, they may typically last 2 weeks (the duration of the steroids).  Frequent: Cramps (if they occur, drink Gatorade and take over-the-counter Magnesium 450-500 mg  once to twice a day); water retention with temporary weight gain; increases in blood sugar; decreased immune system response; increased appetite.  Occasional: Facial flushing (red, warm cheeks); mood swings; menstrual changes.  Uncommon: Long-term decrease or suppression of natural hormones; bone thinning. (These are more common with higher doses or more frequent use. This is why we prefer that our patients avoid having any injection therapies in other practices.)   Very Rare: Severe mood changes; psychosis; aseptic necrosis.  From procedure: Some discomfort is to be expected once the numbing medicine wears off. This should be minimal if ice and heat are applied as instructed.  Call if: (When should I call?)  You experience numbness and weakness that gets worse with time, as opposed to wearing off.  New onset bowel or bladder incontinence. (Applies only to procedures done in the spine)  Emergency Numbers:  Durning business hours (Monday - Thursday, 8:00 AM - 4:00 PM) (Friday, 9:00 AM - 12:00 Noon): [phone]  After hours: [phone]  NOTE: If you are having a problem and are unable connect with, or to talk to a provider, then go to your nearest urgent care or emergency department. If the problem is serious and urgent, please call 911. ____________________________________________________________________________________________    ______________________________________________________________________________________________  Specialty Pain Scale  Introduction:  There are significant differences in how pain is reported. The word pain usually refers to physical pain, but it is also a common synonym of suffering. The medical community uses a scale from 0 (zero) to 10 (ten) to report pain level. Zero (0) is described as "no pain", while ten (10) is described as "the worse pain you can imagine". The problem with this scale is that physical pain is reported along with suffering.  Suffering refers to mental pain, or more often yet it refers to any unpleasant feeling, emotion or aversion associated with the perception of harm or threat of harm. It is the psychological component of pain.  Pain Specialists prefer to separate the two components. The pain scale used by this practice is the Verbal Numerical Rating Scale (VNRS-11). This scale is for the physical pain only. DO NOT INCLUDE how your pain psychologically affects you. This scale is for adults 21 years of age and older. It has 11 (eleven) levels. The 1st level is 0/10. This means: "right now, I have no pain". In the context of pain management, it also means: "right now, my physical pain is under control with the current therapy".  General Information:  The scale should reflect your current level of pain. Unless you are specifically asked for the level of your worst pain, or your average pain. If you are asked for one of these two, then it should be understood that it is over the past 24 hours.  Levels 1 (one) through 5 (five) are described below, and can be treated as an outpatient. Ambulatory pain management facilities such as ours are more than adequate to treat these levels. Levels 6 (six) through 10 (ten) are also described below, however, these must be treated as a hospitalized patient. While levels 6 (six) and 7 (seven) may be evaluated at an urgent care facility, levels 8 (eight) through 10 (ten)   constitute medical emergencies and as such, they belong in a hospital's emergency department. When having these levels (as described below), do not come to our office. Our facility is not equipped to manage these levels. Go directly to an urgent care facility or an emergency department to be evaluated.  Definitions:  Activities of Daily Living (ADL): Activities of daily living (ADL or ADLs) is a term used in healthcare to refer to people's daily self-care activities. Health professionals often use a person's ability or inability  to perform ADLs as a measurement of their functional status, particularly in regard to people post injury, with disabilities and the elderly. There are two ADL levels: Basic and Instrumental. Basic Activities of Daily Living (BADL  or BADLs) consist of self-care tasks that include: Bathing and showering; personal hygiene and grooming (including brushing/combing/styling hair); dressing; Toilet hygiene (getting to the toilet, cleaning oneself, and getting back up); eating and self-feeding (not including cooking or chewing and swallowing); functional mobility, often referred to as "transferring", as measured by the ability to walk, get in and out of bed, and get into and out of a chair; the broader definition (moving from one place to another while performing activities) is useful for people with different physical abilities who are still able to get around independently. Basic ADLs include the things many people do when they get up in the morning and get ready to go out of the house: get out of bed, go to the toilet, bathe, dress, groom, and eat. On the average, loss of function typically follows a particular order. Hygiene is the first to go, followed by loss of toilet use and locomotion. The last to go is the ability to eat. When there is only one remaining area in which the person is independent, there is a 62.9% chance that it is eating and only a 3.5% chance that it is hygiene. Instrumental Activities of Daily Living (IADL or IADLs) are not necessary for fundamental functioning, but they let an individual live independently in a community. IADL consist of tasks that include: cleaning and maintaining the house; home establishment and maintenance; care of others (including selecting and supervising caregivers); care of pets; child rearing; managing money; managing financials (investments, etc.); meal preparation and cleanup; shopping for groceries and necessities; moving within the community; safety procedures  and emergency responses; health management and maintenance (taking prescribed medications); and using the telephone or other form of communication.  Instructions:  Most patients tend to report their pain as a combination of two factors, their physical pain and their psychosocial pain. This last one is also known as "suffering" and it is reflection of how physical pain affects you socially and psychologically. From now on, report them separately.  From this point on, when asked to report your pain level, report only your physical pain. Use the following table for reference.  Pain Clinic Pain Levels (0-5/10)  Pain Level Score  Description  No Pain 0   Mild pain 1 Nagging, annoying, but does not interfere with basic activities of daily living (ADL). Patients are able to eat, bathe, get dressed, toileting (being able to get on and off the toilet and perform personal hygiene functions), transfer (move in and out of bed or a chair without assistance), and maintain continence (able to control bladder and bowel functions). Blood pressure and heart rate are unaffected. A normal heart rate for a healthy adult ranges from 60 to 100 bpm (beats per minute).   Mild to moderate pain 2 Noticeable   and distracting. Impossible to hide from other people. More frequent flare-ups. Still possible to adapt and function close to normal. It can be very annoying and may have occasional stronger flare-ups. With discipline, patients may get used to it and adapt.   Moderate pain 3 Interferes significantly with activities of daily living (ADL). It becomes difficult to feed, bathe, get dressed, get on and off the toilet or to perform personal hygiene functions. Difficult to get in and out of bed or a chair without assistance. Very distracting. With effort, it can be ignored when deeply involved in activities.   Moderately severe pain 4 Impossible to ignore for more than a few minutes. With effort, patients may still be able to  manage work or participate in some social activities. Very difficult to concentrate. Signs of autonomic nervous system discharge are evident: dilated pupils (mydriasis); mild sweating (diaphoresis); sleep interference. Heart rate becomes elevated (>115 bpm). Diastolic blood pressure (lower number) rises above 100 mmHg. Patients find relief in laying down and not moving.   Severe pain 5 Intense and extremely unpleasant. Associated with frowning face and frequent crying. Pain overwhelms the senses.  Ability to do any activity or maintain social relationships becomes significantly limited. Conversation becomes difficult. Pacing back and forth is common, as getting into a comfortable position is nearly impossible. Pain wakes you up from deep sleep. Physical signs will be obvious: pupillary dilation; increased sweating; goosebumps; brisk reflexes; cold, clammy hands and feet; nausea, vomiting or dry heaves; loss of appetite; significant sleep disturbance with inability to fall asleep or to remain asleep. When persistent, significant weight loss is observed due to the complete loss of appetite and sleep deprivation.  Blood pressure and heart rate becomes significantly elevated. Caution: If elevated blood pressure triggers a pounding headache associated with blurred vision, then the patient should immediately seek attention at an urgent or emergency care unit, as these may be signs of an impending stroke.    Emergency Department Pain Levels (6-10/10)  Emergency Room Pain 6 Severely limiting. Requires emergency care and should not be seen or managed at an outpatient pain management facility. Communication becomes difficult and requires great effort. Assistance to reach the emergency department may be required. Facial flushing and profuse sweating along with potentially dangerous increases in heart rate and blood pressure will be evident.   Distressing pain 7 Self-care is very difficult. Assistance is required to  transport, or use restroom. Assistance to reach the emergency department will be required. Tasks requiring coordination, such as bathing and getting dressed become very difficult.   Disabling pain 8 Self-care is no longer possible. At this level, pain is disabling. The individual is unable to do even the most "basic" activities such as walking, eating, bathing, dressing, transferring to a bed, or toileting. Fine motor skills are lost. It is difficult to think clearly.   Incapacitating pain 9 Pain becomes incapacitating. Thought processing is no longer possible. Difficult to remember your own name. Control of movement and coordination are lost.   The worst pain imaginable 10 At this level, most patients pass out from pain. When this level is reached, collapse of the autonomic nervous system occurs, leading to a sudden drop in blood pressure and heart rate. This in turn results in a temporary and dramatic drop in blood flow to the brain, leading to a loss of consciousness. Fainting is one of the body's self defense mechanisms. Passing out puts the brain in a calmed state and causes it to shut down   for a while, in order to begin the healing process.    Summary: 1.   Refer to this scale when providing us with your pain level. 2.   Be accurate and careful when reporting your pain level. This will help with your care. 3.   Over-reporting your pain level will lead to loss of credibility. 4.   Even a level of 1/10 means that there is pain and will be treated at our facility. 5.   High, inaccurate reporting will be documented as "Symptom Exaggeration", leading to loss of credibility and suspicions of possible secondary gains such as obtaining more narcotics, or wanting to appear disabled, for fraudulent reasons. 6.   Only pain levels of 5 or below will be seen at our facility. 7.   Pain levels of 6 and above will be sent to the Emergency Department and the appointment  cancelled.  ______________________________________________________________________________________________     

## 2018-03-29 NOTE — Progress Notes (Signed)
Patient's Name: Ana Washington  MRN: [id number]  Referring Provider: [name], MD  DOB: 27-Apr-1945  PCP: [name], MD  DOS: 03/29/2018  Note by: Gaspar Cola, MD  Service setting: Ambulatory outpatient  Specialty: Interventional Pain Management  Patient type: Established  Location: [facility] (AMB) Pain Management Facility  Visit type: Interventional Procedure   Primary Reason for Visit: Interventional Pain Management Treatment. CC: Back Pain  Procedure:          Anesthesia, Analgesia, Anxiolysis:  Type: Diagnostic Inter-Laminar Thoracic Epidural Block  #1  Region: Posterior Thoracolumbar Level: T5-6 Laterality: Left Paraspinal  Type: Local Anesthesia Indication(s): Analgesia         Route: Infiltration (/IM) IV Access: Declined Sedation: None since NPO instructions were not followed  Local Anesthetic: Lidocaine 1-2%  Position: Prone   Indications: 1. DDD (degenerative disc disease), thoracic   2. Thoracic compression fracture, sequela (T5, T9, T11, and T12)   3. Chronic upper back pain (Primary Area of Pain) (Bilateral) (R>L)   4. Thoracic radiculitis (Bilateral)    Pain Score: Pre-procedure: 8 /10 Post-procedure: 0-No pain/10  Imaging Review  DG Thoracic Spine 2 View CLINICAL DATA:  Upper and lower back pain.  EXAM: THORACIC SPINE 2 VIEWS  COMPARISON:  01/05/2018 10/09/2017.  FINDINGS: Surgical sutures and clips are noted over the chest bilaterally. Severe cervicothoracic and thoracolumbar spine osteopenia, degenerative change, and scoliosis. Multiple lower thoracic and upper lumbar vertebroplasties. The single remaining lower thoracic vertebra in which prior vertebroplasty has not been performed demonstrates a prominent compression fracture. Thoracic vertebra are difficult to number, this compression fracture appears to be in T10.  IMPRESSION: Severe cervicothoracic and thoracolumbar spine osteopenia, degenerative change, and scoliosis. Multiple  lower thoracic and upper lumbar vertebroplasties. The remaining lower thoracic vertebral body in which vertebroplasty has not been performed demonstrates a prominent compression fracture. Thoracic spine vertebra difficult to number, this compression fracture appears to be in T10.  Electronically Signed   By: Marcello Moores  Register   On: 03/22/2018 07:03  Note: Reviewed        Pre-op Assessment:  Ana Washington is a 73 y.o. (year old), female patient, seen today for interventional treatment. She  has a past surgical history that includes Lung surgery (1990 and 1996); Back surgery; Cholecystectomy; Foot surgery; Kyphoplasty (N/A, 07/05/2016); Kyphoplasty (N/A, 11/29/2017); Kyphoplasty (N/A, 12/18/2017); and Kyphoplasty (N/A, 01/05/2018). Ana Washington has a current medication list which includes the following prescription(s): acetaminophen, amlodipine, apixaban, vitamin c, aspirin ec, azithromycin, oyster shell/vitamin d, chlorpheniramine-hydrocodone, cholecalciferol, ciprofloxacin, ferrous sulfate, gabapentin, lorazepam, losartan, metaxalone, metoprolol succinate, morphine, multivitamin with minerals, omeprazole, potassium chloride, prednisone, align, sodium chloride, sucralfate, and tramadol. Her primarily concern today is the Back Pain  Before we even got started, Ana Washington began to experience some nausea and vomiting followed by a drop in her blood pressure.  We did a stat blood glucose level and it was found to be within normal limits.  We started an IV will provide her with some fluids and we gave her ondansetron 4 mg for the nausea and vomiting and also gave her glycopyrrolate 0.2 mg IV.  The patient fully recovered.  After having monitored the patient for a while, she was given the choice of rescheduling or going ahead with the procedure and she decided to proceed.  Initial Vital Signs:  Pulse/HCG Rate: 80ECG Heart Rate: (!) 109 Temp: 97.6 F (36.4 C) Resp: 18 BP: (!) 132/101 SpO2: 95  %  BMI: Estimated body mass index is 17.31 kg/m  as calculated from the following:   Height as of this encounter: 5\' 5"  (1.651 m).   Weight as of this encounter: 104 lb (47.2 kg).  Risk Assessment: Allergies: Reviewed. She is allergic to sulfa antibiotics; codeine; and penicillins.  Allergy Precautions: None required Coagulopathies: Reviewed. None identified.  Blood-thinner therapy: None at this time Active Infection(s): Reviewed. None identified. Ana Washington is afebrile  Site Confirmation: Ana Washington was asked to confirm the procedure and laterality before marking the site Procedure checklist: Completed Consent: Before the procedure and under the influence of no sedative(s), amnesic(s), or anxiolytics, the patient was informed of the treatment options, risks and possible complications. To fulfill our ethical and legal obligations, as recommended by the American Medical Association's Code of Ethics, I have informed the patient of my clinical impression; the nature and purpose of the treatment or procedure; the risks, benefits, and possible complications of the intervention; the alternatives, including doing nothing; the risk(s) and benefit(s) of the alternative treatment(s) or procedure(s); and the risk(s) and benefit(s) of doing nothing. The patient was provided information about the general risks and possible complications associated with the procedure. These may include, but are not limited to: failure to achieve desired goals, infection, bleeding, organ or nerve damage, allergic reactions, paralysis, and death. In addition, the patient was informed of those risks and complications associated to Spine-related procedures, such as failure to decrease pain; infection (i.e.: Meningitis, epidural or intraspinal abscess); bleeding (i.e.: epidural hematoma, subarachnoid hemorrhage, or any other type of intraspinal or peri-dural bleeding); organ or nerve damage (i.e.: Any type of peripheral nerve,  nerve root, or spinal cord injury) with subsequent damage to sensory, motor, and/or autonomic systems, resulting in permanent pain, numbness, and/or weakness of one or several areas of the body; allergic reactions; (i.e.: anaphylactic reaction); and/or death. Furthermore, the patient was informed of those risks and complications associated with the medications. These include, but are not limited to: allergic reactions (i.e.: anaphylactic or anaphylactoid reaction(s)); adrenal axis suppression; blood sugar elevation that in diabetics may result in ketoacidosis or comma; water retention that in patients with history of congestive heart failure may result in shortness of breath, pulmonary edema, and decompensation with resultant heart failure; weight gain; swelling or edema; medication-induced neural toxicity; particulate matter embolism and blood vessel occlusion with resultant organ, and/or nervous system infarction; and/or aseptic necrosis of one or more joints. Finally, the patient was informed that Medicine is not an exact science; therefore, there is also the possibility of unforeseen or unpredictable risks and/or possible complications that may result in a catastrophic outcome. The patient indicated having understood very clearly. We have given the patient no guarantees and we have made no promises. Enough time was given to the patient to ask questions, all of which were answered to the patient's satisfaction. Ana Washington has indicated that she wanted to continue with the procedure. Attestation: I, the ordering provider, attest that I have discussed with the patient the benefits, risks, side-effects, alternatives, likelihood of achieving goals, and potential problems during recovery for the procedure that I have provided informed consent. Date  Time: 03/29/2018  1:12 PM  Pre-Procedure Preparation:  Monitoring: As per clinic protocol. Respiration, ETCO2, SpO2, BP, heart rate and rhythm monitor placed and  checked for adequate function Safety Precautions: Patient was assessed for positional comfort and pressure points before starting the procedure. Time-out: I initiated and conducted the "Time-out" before starting the procedure, as per protocol. The patient was asked to participate by confirming the accuracy of  the "Time Out" information. Verification of the correct person, site, and procedure were performed and confirmed by me, the nursing staff, and the patient. "Time-out" conducted as per Joint Commission's Universal Protocol (UP.01.01.01). Time: 1423  Description of Procedure:          Target Area: For Epidural Steroid injection(s), the target area is the  interlaminar space, initially targeting the lower border of the superior vertebral body lamina. Approach: Interlaminar approach. Area Prepped: Entire Posterior Thoracolumbar Region Prepping solution: ChloraPrep (2% chlorhexidine gluconate and 70% isopropyl alcohol) Safety Precautions: Aspiration looking for blood return was conducted prior to all injections. At no point did we inject any substances, as a needle was being advanced. No attempts were made at seeking any paresthesias. Safe injection practices and needle disposal techniques used. Medications properly checked for expiration dates. SDV (single dose vial) medications used. Description of the Procedure: Protocol guidelines were followed. The patient was placed in position over the fluoroscopy table. The target area was identified and the area prepped in the usual manner. Skin & deeper tissues infiltrated with local anesthetic. Appropriate amount of time allowed to pass for local anesthetics to take effect. The procedure needles were then advanced to the target area. The inferior aspect of the superior lamina was contacted and the needle walked caudad, until the lamina was cleared. The epidural space was identified using "loss-of-resistance technique" with 0.9% PF-NSS (2-72mL), in a low friction  10cc LOR glass syringe. Proper needle placement was secured. Negative aspiration confirmed. Solution injected in intermittent fashion, asking for systemic symptoms every 0.5 cc of injectate. The needles were then removed and the area cleansed, making sure to leave some of the prepping solution behind to take advantage of its long term bactericidal properties. Vitals:   03/29/18 1440 03/29/18 1446 03/29/18 1451 03/29/18 1459  BP: (!) 161/104 (!) 157/95 (!) 153/91 (!) 148/93  Pulse: (!) 109     Resp: (!) 26 20 16 20   Temp:  98 F (36.7 C)    TempSrc:  Temporal    SpO2: 100% 92% 96% 95%  Weight:      Height:        Start Time: 1423 hrs. End Time: 1437 hrs. Materials:  Needle(s) Type: Epidural needle Gauge: 17G Length: 3.5-in Medication(s): Please see orders for medications and dosing details.  Imaging Guidance (Spinal):          Type of Imaging Technique: Fluoroscopy Guidance (Spinal) Indication(s): Assistance in needle guidance and placement for procedures requiring needle placement in or near specific anatomical locations not easily accessible without such assistance. Exposure Time: Please see nurses notes. Contrast: Before injecting any contrast, we confirmed that the patient did not have an allergy to iodine, shellfish, or radiological contrast. Once satisfactory needle placement was completed at the desired level, radiological contrast was injected. Contrast injected under live fluoroscopy. No contrast complications. See chart for type and volume of contrast used. Fluoroscopic Guidance: I was personally present during the use of fluoroscopy. "Tunnel Vision Technique" used to obtain the best possible view of the target area. Parallax error corrected before commencing the procedure. "Direction-depth-direction" technique used to introduce the needle under continuous pulsed fluoroscopy. Once target was reached, antero-posterior, oblique, and lateral fluoroscopic projection used confirm needle  placement in all planes. Images permanently stored in EMR. Interpretation: I personally interpreted the imaging intraoperatively. Adequate needle placement confirmed in multiple planes. Appropriate spread of contrast into desired area was observed. No evidence of afferent or efferent intravascular uptake. No intrathecal or subarachnoid  spread observed. Permanent images saved into the patient's record.  Antibiotic Prophylaxis:   Anti-infectives (From admission, onward)   None     Indication(s): None identified  Post-operative Assessment:  Post-procedure Vital Signs:  Pulse/HCG Rate: (!) 109(!) 101 Temp: 98 F (36.7 C) Resp: 20 BP: (!) 148/93 SpO2: 95 %  EBL: None  Complications: No immediate post-treatment complications observed by team, or reported by patient.  Note: The patient tolerated the entire procedure well. A repeat set of vitals were taken after the procedure and the patient was kept under observation following institutional policy, for this type of procedure. Post-procedural neurological assessment was performed, showing return to baseline, prior to discharge. The patient was provided with post-procedure discharge instructions, including a section on how to identify potential problems. Should any problems arise concerning this procedure, the patient was given instructions to immediately contact us, at any time, without hesitation. In any case, we plan to contact the patient by telephone for a follow-up status report regarding this interventional procedure.  Comments:  No additional relevant information.  Plan of Care    Imaging Orders     DG C-Arm 1-60 Min-No Report  Procedure Orders     Thoracic Epidural Injection  Medications ordered for procedure: Meds ordered this encounter  Medications  . iopamidol (ISOVUE-M) 41 % intrathecal injection 10 mL    Must be Myelogram-compatible. If not available, you may substitute with a water-soluble, non-ionic, hypoallergenic,  myelogram-compatible radiological contrast medium.  [name] lidocaine (XYLOCAINE) 2 % (with pres) injection 400 mg  . sodium chloride flush (NS) 0.9 % injection 2 mL  . ropivacaine (PF) 2 mg/mL (0.2%) (NAROPIN) injection 2 mL  . dexamethasone (DECADRON) injection 10 mg  . ondansetron (ZOFRAN) injection 4 mg  . glycopyrrolate (ROBINUL) injection 0.2 mg   Medications administered: We administered iopamidol, lidocaine, sodium chloride flush, ropivacaine (PF) 2 mg/mL (0.2%), dexamethasone, ondansetron, and glycopyrrolate.  See the medical record for exact dosing, route, and time of administration.  Disposition: Discharge home  Discharge Date & Time: 03/29/2018; 1503 hrs.   Physician-requested Follow-up: Return for PPE (2 wks), w/ Dr. Dossie Arbour.  Future Appointments  Date Time Provider [place]  04/16/2018 11:15 AM Milinda Pointer, MD [facility] None   Primary Care Physician: [name], MD Location: [facility] Note by: Gaspar Cola, MD Date: 03/29/2018; Time: 3:23 PM  Disclaimer:  Medicine is not an Chief Strategy Officer. The only guarantee in medicine is that nothing is guaranteed. It is important to note that the decision to proceed with this intervention was based on the information collected from the patient. The Data and conclusions were drawn from the patient's questionnaire, the interview, and the physical examination. Because the information was provided in large part by the patient, it cannot be guaranteed that it has not been purposely or unconsciously manipulated. Every effort has been made to obtain as much relevant data as possible for this evaluation. It is important to note that the conclusions that lead to this procedure are derived in large part from the available data. Always take into account that the treatment will also be dependent on availability of resources and existing treatment guidelines, considered by other Pain Management  Practitioners as being common knowledge and practice, at the time of the intervention. For Medico-Legal purposes, it is also important to point out that variation in procedural techniques and pharmacological choices are the acceptable norm. The indications, contraindications, technique, and results of the above procedure should only be interpreted and  judged by a Board-Certified Interventional Pain Specialist with extensive familiarity and expertise in the same exact procedure and technique.

## 2018-03-29 NOTE — Progress Notes (Signed)
Safety precautions to be maintained throughout the outpatient stay will include: orient to surroundings, keep bed in low position, maintain call bell within reach at all times, provide assistance with transfer out of bed and ambulation.  Patient sitting in wheelchair and suddenly became nauseated.  Saul Fordyce RN states her blood pressure was low.  Orders received to get an IV started.  At this time, VS were 139/91, HR 98, sat 98.  o2 @ 2L [place] on.  Continues to have small amount of yellow emesis.  Placed onto ed on right side.  Blood sugar 93.  Dr Dossie Arbour notified.  See vs flow sheet.  Patient states she is feeling so much better, family member at bedside.  VSS, Dr Dossie Arbour in with patient.  Patient ambulated to procedure room with CNA.

## 2018-03-30 ENCOUNTER — Telehealth: Payer: Self-pay

## 2018-03-30 NOTE — Telephone Encounter (Signed)
Post procedure phone call.  Patient states she id still having some pain.  Patient states her nausea is better.  States her pain is feeling some better today.

## 2018-04-04 ENCOUNTER — Encounter
Admission: RE | Admit: 2018-04-04 | Discharge: 2018-04-04 | Disposition: A | Discharge status: Home / Self Care | Payer: Medicare HMO | Source: Ambulatory Visit | Attending: Orthopedic Surgery | Admitting: Orthopedic Surgery

## 2018-04-04 ENCOUNTER — Other Ambulatory Visit: Payer: Self-pay

## 2018-04-04 HISTORY — DX: Headache: R51

## 2018-04-04 HISTORY — DX: Peptic ulcer, site unspecified, unspecified as acute or chronic, without hemorrhage or perforation: K27.9

## 2018-04-04 HISTORY — DX: Headache, unspecified: R51.9

## 2018-04-04 HISTORY — DX: Postgastric surgery syndromes: K91.1

## 2018-04-04 HISTORY — DX: Psoriasis, unspecified: L40.9

## 2018-04-04 NOTE — Patient Instructions (Signed)
Your procedure is scheduled on: 04/05/18 1200 Report to Day Surgery. [facility] SECOND FLOOR.  Remember: Instructions that are not followed completely may result in serious medical risk,  up to and including death, or upon the discretion of your surgeon and anesthesiologist your  surgery may need to be rescheduled.     _X__ 1. Do not eat food after midnight the night before your procedure.                 No gum chewing or hard candies. You may drink clear liquids up to 2 hours                 before you are scheduled to arrive for your surgery- DO not drink clear                 liquids within 2 hours of the start of your surgery.                 Clear Liquids include:  water, apple juice without pulp, clear carbohydrate                 drink such as Clearfast of Gatorade, Black Coffee or Tea (Do not add                 anything to coffee or tea).  __X__2.  On the morning of surgery brush your teeth with toothpaste and water, you                may rinse your mouth with mouthwash if you wish.  Do not swallow any toothpaste of mouthwash.     _X__ 3.  No Alcohol for 24 hours before or after surgery.   _X__ 4.  Do Not Smoke or use e-cigarettes For 24 Hours Prior to Your Surgery.                 Do not use any chewable tobacco products for at least 6 hours prior to                 surgery.  ____  5.  Bring all medications with you on the day of surgery if instructed.   ____  6.  Notify your doctor if there is any change in your medical condition      (cold, fever, infections).     Do not wear jewelry, make-up, hairpins, clips or nail polish. Do not wear lotions, powders, or perfumes. You may wear deodorant. Do not shave 48 hours prior to surgery. Men may shave face and neck. Do not bring valuables to the hospital.    [facility] is not responsible for any belongings or valuables.  Contacts, dentures or bridgework may not be worn into surgery. Leave your  suitcase in the car. After surgery it may be brought to your room. For patients admitted to the hospital, discharge time is determined by your treatment team.   Patients discharged the day of surgery will not be allowed to drive home.      _X_ Take these medicines the morning of surgery with A SIP OF WATER:    1. PREDNISONE  2. AMLODIPINE  3. GABAPENTIN  4. LEVAQUIN  5. LORAZEPAM  6. OMEPRAZOLE             7. SULCRAFATE  ____ Fleet Enema (as directed)   ____ Use CHG Soap as directed  ____ Use inhalers on the day of surgery  ____ Stop metformin 2 days  prior to surgery    ____ Take 1/2 of usual insulin dose the night before surgery. No insulin the morning          of surgery.   __X__ Stop Coumadin/Plavix/aspirin on   ELIQUIS AND ASPIRIN LAST DOSE2/29/20  ____ Stop Anti-inflammatories on    ____ Stop supplements until after surgery.    ____ Bring C-Pap to the hospital.

## 2018-04-05 ENCOUNTER — Ambulatory Visit
Admission: RE | Admit: 2018-04-05 | Discharge: 2018-04-05 | Disposition: A | Discharge status: Home / Self Care | Payer: Medicare Other | Source: Home / Self Care | Attending: Orthopedic Surgery | Admitting: Orthopedic Surgery

## 2018-04-05 ENCOUNTER — Encounter
Admission: RE | Disposition: A | Discharge status: Home / Self Care | Payer: Self-pay | Source: Home / Self Care | Attending: Orthopedic Surgery

## 2018-04-05 ENCOUNTER — Encounter: Payer: Self-pay | Admitting: *Deleted

## 2018-04-05 ENCOUNTER — Ambulatory Visit: Payer: Medicare Other | Admitting: Certified Registered"

## 2018-04-05 ENCOUNTER — Ambulatory Visit: Payer: Medicare Other

## 2018-04-05 ENCOUNTER — Other Ambulatory Visit: Payer: Self-pay

## 2018-04-05 DIAGNOSIS — S22060A Wedge compression fracture of T7-T8 vertebra, initial encounter for closed fracture: Secondary | ICD-10-CM | POA: Diagnosis not present

## 2018-04-05 DIAGNOSIS — I10 Essential (primary) hypertension: Secondary | ICD-10-CM

## 2018-04-05 DIAGNOSIS — Z79899 Other long term (current) drug therapy: Secondary | ICD-10-CM

## 2018-04-05 DIAGNOSIS — Z880 Allergy status to penicillin: Secondary | ICD-10-CM

## 2018-04-05 DIAGNOSIS — K219 Gastro-esophageal reflux disease without esophagitis: Secondary | ICD-10-CM

## 2018-04-05 DIAGNOSIS — Z882 Allergy status to sulfonamides status: Secondary | ICD-10-CM

## 2018-04-05 DIAGNOSIS — M1990 Unspecified osteoarthritis, unspecified site: Secondary | ICD-10-CM

## 2018-04-05 DIAGNOSIS — Z7901 Long term (current) use of anticoagulants: Secondary | ICD-10-CM | POA: Insufficient documentation

## 2018-04-05 DIAGNOSIS — I4891 Unspecified atrial fibrillation: Secondary | ICD-10-CM

## 2018-04-05 DIAGNOSIS — Z885 Allergy status to narcotic agent status: Secondary | ICD-10-CM

## 2018-04-05 DIAGNOSIS — J449 Chronic obstructive pulmonary disease, unspecified: Secondary | ICD-10-CM | POA: Insufficient documentation

## 2018-04-05 DIAGNOSIS — Z7982 Long term (current) use of aspirin: Secondary | ICD-10-CM

## 2018-04-05 DIAGNOSIS — Z9981 Dependence on supplemental oxygen: Secondary | ICD-10-CM

## 2018-04-05 DIAGNOSIS — I429 Cardiomyopathy, unspecified: Secondary | ICD-10-CM

## 2018-04-05 DIAGNOSIS — S22070A Wedge compression fracture of T9-T10 vertebra, initial encounter for closed fracture: Secondary | ICD-10-CM

## 2018-04-05 DIAGNOSIS — X58XXXA Exposure to other specified factors, initial encounter: Secondary | ICD-10-CM | POA: Insufficient documentation

## 2018-04-05 DIAGNOSIS — I252 Old myocardial infarction: Secondary | ICD-10-CM

## 2018-04-05 DIAGNOSIS — Z792 Long term (current) use of antibiotics: Secondary | ICD-10-CM

## 2018-04-05 DIAGNOSIS — K449 Diaphragmatic hernia without obstruction or gangrene: Secondary | ICD-10-CM | POA: Diagnosis not present

## 2018-04-05 DIAGNOSIS — Z419 Encounter for procedure for purposes other than remedying health state, unspecified: Secondary | ICD-10-CM

## 2018-04-05 HISTORY — PX: KYPHOPLASTY: SHX5884

## 2018-04-05 LAB — CBC
HCT: 38.4 % (ref 36.0–46.0)
Hemoglobin: 12.5 g/dL (ref 12.0–15.0)
MCH: 34 pg (ref 26.0–34.0)
MCHC: 32.6 g/dL (ref 30.0–36.0)
MCV: 104.3 fL — ABNORMAL HIGH (ref 80.0–100.0)
PLATELETS: 431 10*3/uL — AB (ref 150–400)
RBC: 3.68 MIL/uL — AB (ref 3.87–5.11)
RDW: 11.8 % (ref 11.5–15.5)
WBC: 21.4 10*3/uL — ABNORMAL HIGH (ref 4.0–10.5)
nRBC: 0 % (ref 0.0–0.2)

## 2018-04-05 SURGERY — KYPHOPLASTY
Anesthesia: General | Site: Back

## 2018-04-05 MED ORDER — LACTATED RINGERS IV SOLN
INTRAVENOUS | Status: DC
  Administered 2018-04-05: 12:00:00 via INTRAVENOUS

## 2018-04-05 MED ORDER — FENTANYL CITRATE (PF) 100 MCG/2ML IJ SOLN
25.0000 ug | INTRAMUSCULAR | Status: DC | PRN
  Administered 2018-04-05 (×2): 25 ug via INTRAVENOUS

## 2018-04-05 MED ORDER — LIDOCAINE HCL (PF) 1 % IJ SOLN
INTRAMUSCULAR | Status: AC
  Filled 2018-04-05: qty 30

## 2018-04-05 MED ORDER — ROCURONIUM BROMIDE 50 MG/5ML IV SOLN
INTRAVENOUS | Status: AC
  Filled 2018-04-05: qty 1

## 2018-04-05 MED ORDER — PROPOFOL 10 MG/ML IV BOLUS
INTRAVENOUS | Status: DC | PRN
  Administered 2018-04-05: 30 mg via INTRAVENOUS

## 2018-04-05 MED ORDER — PROPOFOL 500 MG/50ML IV EMUL
INTRAVENOUS | Status: AC
  Filled 2018-04-05: qty 50

## 2018-04-05 MED ORDER — LIDOCAINE HCL 1 % IJ SOLN
INTRAMUSCULAR | Status: DC | PRN
  Administered 2018-04-05: 30 mL

## 2018-04-05 MED ORDER — BUPIVACAINE HCL (PF) 0.5 % IJ SOLN
INTRAMUSCULAR | Status: AC
  Filled 2018-04-05: qty 30

## 2018-04-05 MED ORDER — PHENYLEPHRINE HCL 10 MG/ML IJ SOLN
INTRAMUSCULAR | Status: AC
  Filled 2018-04-05: qty 1

## 2018-04-05 MED ORDER — ONDANSETRON HCL 4 MG/2ML IJ SOLN: 4.0000 mg | Freq: Four times a day (QID) | INTRAMUSCULAR | Status: DC | PRN

## 2018-04-05 MED ORDER — IOPAMIDOL (ISOVUE-M 200) INJECTION 41%
INTRAMUSCULAR | Status: DC | PRN
  Administered 2018-04-05: 20 mL

## 2018-04-05 MED ORDER — FENTANYL CITRATE (PF) 250 MCG/5ML IJ SOLN
INTRAMUSCULAR | Status: AC
  Filled 2018-04-05: qty 5

## 2018-04-05 MED ORDER — ONDANSETRON HCL 4 MG PO TABS: 4.0000 mg | ORAL_TABLET | Freq: Four times a day (QID) | ORAL | Status: DC | PRN

## 2018-04-05 MED ORDER — BUPIVACAINE-EPINEPHRINE (PF) 0.25% -1:200000 IJ SOLN
INTRAMUSCULAR | Status: AC
  Filled 2018-04-05: qty 30

## 2018-04-05 MED ORDER — CLINDAMYCIN PHOSPHATE 600 MG/50ML IV SOLN
INTRAVENOUS | Status: AC
  Filled 2018-04-05: qty 50

## 2018-04-05 MED ORDER — METOCLOPRAMIDE HCL 10 MG PO TABS: 5.0000 mg | ORAL_TABLET | Freq: Three times a day (TID) | ORAL | Status: DC | PRN

## 2018-04-05 MED ORDER — FENTANYL CITRATE (PF) 100 MCG/2ML IJ SOLN
INTRAMUSCULAR | Status: AC
  Administered 2018-04-05: 25 ug via INTRAVENOUS
  Filled 2018-04-05: qty 2

## 2018-04-05 MED ORDER — PHENYLEPHRINE HCL 10 MG/ML IJ SOLN
INTRAMUSCULAR | Status: DC | PRN
  Administered 2018-04-05 (×3): 100 ug via INTRAVENOUS

## 2018-04-05 MED ORDER — ONDANSETRON HCL 4 MG/2ML IJ SOLN: 4.0000 mg | Freq: Once | INTRAMUSCULAR | Status: DC | PRN

## 2018-04-05 MED ORDER — PROPOFOL 10 MG/ML IV BOLUS
INTRAVENOUS | Status: AC
  Filled 2018-04-05: qty 20

## 2018-04-05 MED ORDER — HYDROCODONE-ACETAMINOPHEN 5-325 MG PO TABS: 1.0000 | ORAL_TABLET | ORAL | Status: DC | PRN

## 2018-04-05 MED ORDER — SODIUM CHLORIDE 0.9 % IV SOLN: INTRAVENOUS | Status: DC

## 2018-04-05 MED ORDER — CLINDAMYCIN PHOSPHATE 600 MG/50ML IV SOLN
600.0000 mg | Freq: Once | INTRAVENOUS | Status: AC
  Administered 2018-04-05: 600 mg via INTRAVENOUS

## 2018-04-05 MED ORDER — LIDOCAINE HCL (PF) 2 % IJ SOLN
INTRAMUSCULAR | Status: AC
  Filled 2018-04-05: qty 10

## 2018-04-05 MED ORDER — EPINEPHRINE PF 1 MG/ML IJ SOLN
INTRAMUSCULAR | Status: AC
  Filled 2018-04-05: qty 1

## 2018-04-05 MED ORDER — PROPOFOL 500 MG/50ML IV EMUL
INTRAVENOUS | Status: DC | PRN
  Administered 2018-04-05: 100 ug/kg/min via INTRAVENOUS

## 2018-04-05 MED ORDER — BUPIVACAINE-EPINEPHRINE (PF) 0.5% -1:200000 IJ SOLN
INTRAMUSCULAR | Status: DC | PRN
  Administered 2018-04-05: 20 mL via PERINEURAL

## 2018-04-05 MED ORDER — METOCLOPRAMIDE HCL 5 MG/ML IJ SOLN: 5.0000 mg | Freq: Three times a day (TID) | INTRAMUSCULAR | Status: DC | PRN

## 2018-04-05 MED ORDER — FENTANYL CITRATE (PF) 100 MCG/2ML IJ SOLN
INTRAMUSCULAR | Status: DC | PRN
  Administered 2018-04-05: 50 ug via INTRAVENOUS
  Administered 2018-04-05: 25 ug via INTRAVENOUS

## 2018-04-05 SURGICAL SUPPLY — 21 items
CEMENT KYPHON CX01A KIT/MIXER (Cement) ×2 IMPLANT
COVER WAND RF STERILE (DRAPES) ×2 IMPLANT
DERMABOND ADVANCED (GAUZE/BANDAGES/DRESSINGS) ×1
DERMABOND ADVANCED .7 DNX12 (GAUZE/BANDAGES/DRESSINGS) ×1 IMPLANT
DEVICE BIOPSY BONE KYPH (INSTRUMENTS) ×1 IMPLANT
DEVICE BIOPSY BONE KYPHX (INSTRUMENTS) ×1 IMPLANT
DRAPE C-ARM XRAY 36X54 (DRAPES) ×2 IMPLANT
DURAPREP 26ML APPLICATOR (WOUND CARE) ×2 IMPLANT
FEE RENTAL RFA GENERATOR (MISCELLANEOUS) IMPLANT
GLOVE SURG SYN 9.0  PF PI (GLOVE) ×1
GLOVE SURG SYN 9.0 PF PI (GLOVE) ×1 IMPLANT
GOWN SRG 2XL LVL 4 RGLN SLV (GOWNS) ×1 IMPLANT
GOWN STRL NON-REIN 2XL LVL4 (GOWNS) ×1
GOWN STRL REUS W/ TWL LRG LVL3 (GOWN DISPOSABLE) ×1 IMPLANT
GOWN STRL REUS W/TWL LRG LVL3 (GOWN DISPOSABLE) ×1
PACK KYPHOPLASTY (MISCELLANEOUS) ×2 IMPLANT
RENTAL RFA GENERATOR (MISCELLANEOUS) IMPLANT
STRAP SAFETY 5IN WIDE (MISCELLANEOUS) ×2 IMPLANT
TRAY KYPHOPAK 15/2 EXPRESS (KITS) ×1 IMPLANT
TRAY KYPHOPAK 15/3 EXPRESS 1ST (MISCELLANEOUS) ×1 IMPLANT
TRAY KYPHOPAK 20/3 EXPRESS 1ST (MISCELLANEOUS) ×1 IMPLANT

## 2018-04-05 NOTE — Discharge Instructions (Signed)
Remove Band-Aid on Saturday.  Okay to take a shower then.  Take it easy today and tomorrow and resume more normal activities on Saturday.  Call office if having problems.  [phone]  [phone]

## 2018-04-05 NOTE — Op Note (Signed)
Date April 05, 2018  time 1:14 PM   PATIENT:  Ana Washington   PRE-OPERATIVE DIAGNOSIS:  closed wedge compression fracture of T10   POST-OPERATIVE DIAGNOSIS:  closed wedge compression fracture of T10   PROCEDURE:  Procedure(s): KYPHOPLASTY T10  SURGEON: Laurene Footman, MD   ASSISTANTS: None   ANESTHESIA:   local and MAC   EBL:  No intake/output data recorded.   BLOOD ADMINISTERED:none   DRAINS: none    LOCAL MEDICATIONS USED:  MARCAINE    and XYLOCAINE    SPECIMEN:   Vertebral body biopsy T10   DISPOSITION OF SPECIMEN:  Pathology   COUNTS:  YES   TOURNIQUET:  * No tourniquets in log *   IMPLANTS: Bone cement   DICTATION: .Dragon Dictation  patient was brought to the operating room and after adequate anesthesia was obtained the patient was placed prone.  C arm was brought in in good visualization of the affected level obtained on both AP and lateral projections.  After patient identification and timeout procedures were completed, local anesthetic was infiltrated with 10 cc 1% Xylocaine infiltrated subcutaneously.  This is done the area on both sides of the planned approach.  The back was then prepped and draped in the usual sterile manner and repeat timeout procedure carried out.  A spinal needle was brought down to the pedicle on both sides side of  T10 and a 50-50 mix of 1% Xylocaine half percent Sensorcaine with epinephrine total of 20 cc injected.  After allowing this to set a small incision was made and the trocar was advanced into the vertebral body in an extrapedicular fashion.  Biopsy was obtained Drilling was carried out balloon inserted with inflation to 2 cc.    This across the midline so a second trocar was not required.  When the cement was appropriate consistency 3 cc were injected into the vertebral body without extravasation, good fill superior to inferior endplates and from right to left sides along the inferior endplate.  After the cement had set the trochar was  removed and permanent C-arm views obtained.  The wound was closed with Dermabond followed by Band-Aid   PLAN OF CARE: Discharge to home after PACU   PATIENT DISPOSITION:  PACU - hemodynamically stable.

## 2018-04-05 NOTE — Anesthesia Post-op Follow-up Note (Signed)
Anesthesia QCDR form completed.        

## 2018-04-05 NOTE — Anesthesia Preprocedure Evaluation (Signed)
Anesthesia Evaluation  Patient identified by MRN, date of birth, ID band Patient awake    Reviewed: Allergy & Precautions, NPO status , Patient's Chart, lab work & pertinent test results  History of Anesthesia Complications (+) [name] and history of anesthetic complications  Airway Mallampati: II  TM Distance: >3 FB Neck ROM: Full    Dental no notable dental hx.    Pulmonary shortness of breath and with exertion, neg sleep apnea, COPD,  oxygen dependent,    breath sounds clear to auscultation- rhonchi (-) wheezing      Cardiovascular hypertension, + CAD and + Past MI  (-) Cardiac Stents and (-) CABG + dysrhythmias Atrial Fibrillation  Rhythm:Regular Rate:Normal - Systolic murmurs and - Diastolic murmurs    Neuro/Psych  Headaches,  Neuromuscular disease negative psych ROS   GI/Hepatic Neg liver ROS, PUD, GERD  ,  Endo/Other  negative endocrine ROSneg diabetes  Renal/GU negative Renal ROS     Musculoskeletal  (+) Arthritis ,   Abdominal (+) - obese,   Peds  Hematology negative hematology ROS (+)   Anesthesia Other Findings Past Medical History: No date: Arthritis No date: Atrial fibrillation (HCC) No date: Bronchiectasis (HCC) No date: CAD (coronary artery disease) 07/30/2017: CAD (coronary artery disease) No date: Dyspnea No date: Dysrhythmia     Comment:  afib 10/03/2013: Essential hypertension, malignant No date: Family history of adverse reaction to anesthesia     Comment:  sister [name] No date: GERD (gastroesophageal reflux disease) No date: Hypertension No date: Lung disease 2007: Myocardial infarction ([place])     Comment:  Non-STEMI No date: [name] (postoperative nausea and vomiting)   Reproductive/Obstetrics                             Anesthesia Physical  Anesthesia Plan  ASA: III  Anesthesia Plan: General   Post-op Pain Management:    Induction: Intravenous  [name] Risk  Score and Plan: 3 and Propofol infusion  Airway Management Planned: Nasal Cannula  Additional Equipment:   Intra-op Plan:   Post-operative Plan:   Informed Consent: I have reviewed the patients History and Physical, chart, labs and discussed the procedure including the risks, benefits and alternatives for the proposed anesthesia with the patient or authorized representative who has indicated his/her understanding and acceptance.     Dental advisory given  Plan Discussed with: CRNA and Anesthesiologist  Anesthesia Plan Comments:         Anesthesia Quick Evaluation

## 2018-04-05 NOTE — Transfer of Care (Signed)
Immediate Anesthesia Transfer of Care Note  Patient: Ana Washington  Procedure(s) Performed: T10 KYPHOPLASTY (N/A Back)  Patient Location: PACU  Anesthesia Type:MAC  Level of Consciousness: drowsy  Airway & Oxygen Therapy: Patient Spontanous Breathing and Patient connected to face mask oxygen  Post-op Assessment: Report given to RN and Post -op Vital signs reviewed and stable  Post vital signs: Reviewed and stable  Last Vitals:  Vitals Value Taken Time  BP 143/82 04/05/2018  1:12 PM  Temp    Pulse 96 04/05/2018  1:17 PM  Resp 34 04/05/2018  1:17 PM  SpO2 100 % 04/05/2018  1:17 PM  Vitals shown include unvalidated device data.  Last Pain:  Vitals:   04/05/18 1051  TempSrc: Oral  PainSc: 8          Complications: No apparent anesthesia complications

## 2018-04-05 NOTE — H&P (Signed)
Reviewed paper H+P, will be scanned into chart. No changes noted.  

## 2018-04-06 LAB — SURGICAL PATHOLOGY

## 2018-04-06 NOTE — Anesthesia Postprocedure Evaluation (Signed)
Anesthesia Post Note  Patient: Ana Washington  Procedure(s) Performed: T10 KYPHOPLASTY (N/A Back)  Patient location during evaluation: PACU Anesthesia Type: General Level of consciousness: awake and alert and oriented Pain management: pain level controlled Vital Signs Assessment: post-procedure vital signs reviewed and stable Respiratory status: spontaneous breathing Cardiovascular status: blood pressure returned to baseline Anesthetic complications: no     Last Vitals:  Vitals:   04/05/18 1425 04/05/18 1505  BP: 136/87 135/82  Pulse: 93 93  Resp: 18   Temp: 36.4 C 36.8 C  SpO2: 98% 99%    Last Pain:  Vitals:   04/06/18 1048  TempSrc:   PainSc: 10-Worst pain ever                 [place] Delauder

## 2018-04-07 ENCOUNTER — Inpatient Hospital Stay
Admission: EM | Admit: 2018-04-07 | Discharge: 2018-04-14 | DRG: 478 | Disposition: A | Discharge status: Hospice | Payer: Medicare Other | Attending: Specialist | Admitting: Specialist

## 2018-04-07 DIAGNOSIS — I252 Old myocardial infarction: Secondary | ICD-10-CM

## 2018-04-07 DIAGNOSIS — R1084 Generalized abdominal pain: Secondary | ICD-10-CM

## 2018-04-07 DIAGNOSIS — K2970 Gastritis, unspecified, without bleeding: Secondary | ICD-10-CM | POA: Diagnosis present

## 2018-04-07 DIAGNOSIS — I480 Paroxysmal atrial fibrillation: Secondary | ICD-10-CM | POA: Diagnosis present

## 2018-04-07 DIAGNOSIS — M1990 Unspecified osteoarthritis, unspecified site: Secondary | ICD-10-CM | POA: Diagnosis present

## 2018-04-07 DIAGNOSIS — E871 Hypo-osmolality and hyponatremia: Secondary | ICD-10-CM | POA: Diagnosis present

## 2018-04-07 DIAGNOSIS — R112 Nausea with vomiting, unspecified: Secondary | ICD-10-CM | POA: Diagnosis present

## 2018-04-07 DIAGNOSIS — I5181 Takotsubo syndrome: Secondary | ICD-10-CM | POA: Diagnosis present

## 2018-04-07 DIAGNOSIS — Z7982 Long term (current) use of aspirin: Secondary | ICD-10-CM

## 2018-04-07 DIAGNOSIS — K219 Gastro-esophageal reflux disease without esophagitis: Secondary | ICD-10-CM | POA: Diagnosis present

## 2018-04-07 DIAGNOSIS — G8918 Other acute postprocedural pain: Secondary | ICD-10-CM | POA: Diagnosis present

## 2018-04-07 DIAGNOSIS — R079 Chest pain, unspecified: Secondary | ICD-10-CM | POA: Diagnosis present

## 2018-04-07 DIAGNOSIS — J479 Bronchiectasis, uncomplicated: Secondary | ICD-10-CM | POA: Diagnosis present

## 2018-04-07 DIAGNOSIS — E878 Other disorders of electrolyte and fluid balance, not elsewhere classified: Secondary | ICD-10-CM | POA: Diagnosis present

## 2018-04-07 DIAGNOSIS — G894 Chronic pain syndrome: Secondary | ICD-10-CM | POA: Diagnosis present

## 2018-04-07 DIAGNOSIS — I2510 Atherosclerotic heart disease of native coronary artery without angina pectoris: Secondary | ICD-10-CM | POA: Diagnosis present

## 2018-04-07 DIAGNOSIS — Z880 Allergy status to penicillin: Secondary | ICD-10-CM

## 2018-04-07 DIAGNOSIS — X58XXXA Exposure to other specified factors, initial encounter: Secondary | ICD-10-CM | POA: Diagnosis present

## 2018-04-07 DIAGNOSIS — Z7901 Long term (current) use of anticoagulants: Secondary | ICD-10-CM

## 2018-04-07 DIAGNOSIS — Z8711 Personal history of peptic ulcer disease: Secondary | ICD-10-CM

## 2018-04-07 DIAGNOSIS — E876 Hypokalemia: Secondary | ICD-10-CM | POA: Diagnosis present

## 2018-04-07 DIAGNOSIS — Z9981 Dependence on supplemental oxygen: Secondary | ICD-10-CM

## 2018-04-07 DIAGNOSIS — S22060A Wedge compression fracture of T7-T8 vertebra, initial encounter for closed fracture: Principal | ICD-10-CM | POA: Diagnosis present

## 2018-04-07 DIAGNOSIS — I10 Essential (primary) hypertension: Secondary | ICD-10-CM | POA: Diagnosis present

## 2018-04-07 DIAGNOSIS — Z8249 Family history of ischemic heart disease and other diseases of the circulatory system: Secondary | ICD-10-CM

## 2018-04-07 DIAGNOSIS — Z792 Long term (current) use of antibiotics: Secondary | ICD-10-CM

## 2018-04-07 DIAGNOSIS — Z419 Encounter for procedure for purposes other than remedying health state, unspecified: Secondary | ICD-10-CM

## 2018-04-07 DIAGNOSIS — S22070A Wedge compression fracture of T9-T10 vertebra, initial encounter for closed fracture: Secondary | ICD-10-CM | POA: Diagnosis present

## 2018-04-07 DIAGNOSIS — J9610 Chronic respiratory failure, unspecified whether with hypoxia or hypercapnia: Secondary | ICD-10-CM | POA: Diagnosis present

## 2018-04-07 DIAGNOSIS — M549 Dorsalgia, unspecified: Secondary | ICD-10-CM | POA: Diagnosis present

## 2018-04-07 DIAGNOSIS — Z7952 Long term (current) use of systemic steroids: Secondary | ICD-10-CM

## 2018-04-07 DIAGNOSIS — Z885 Allergy status to narcotic agent status: Secondary | ICD-10-CM

## 2018-04-07 DIAGNOSIS — K449 Diaphragmatic hernia without obstruction or gangrene: Secondary | ICD-10-CM | POA: Diagnosis present

## 2018-04-07 DIAGNOSIS — F419 Anxiety disorder, unspecified: Secondary | ICD-10-CM | POA: Diagnosis present

## 2018-04-07 DIAGNOSIS — Z79899 Other long term (current) drug therapy: Secondary | ICD-10-CM

## 2018-04-07 DIAGNOSIS — Z882 Allergy status to sulfonamides status: Secondary | ICD-10-CM

## 2018-04-07 DIAGNOSIS — Z66 Do not resuscitate: Secondary | ICD-10-CM | POA: Diagnosis present

## 2018-04-07 DIAGNOSIS — I4891 Unspecified atrial fibrillation: Secondary | ICD-10-CM | POA: Diagnosis present

## 2018-04-07 LAB — COMPREHENSIVE METABOLIC PANEL
ALT: 13 U/L (ref 0–44)
AST: 24 U/L (ref 15–41)
Albumin: 3.5 g/dL (ref 3.5–5.0)
Alkaline Phosphatase: 74 U/L (ref 38–126)
Anion gap: 9 (ref 5–15)
BUN: 7 mg/dL — ABNORMAL LOW (ref 8–23)
CO2: 36 mmol/L — ABNORMAL HIGH (ref 22–32)
Calcium: 8.8 mg/dL — ABNORMAL LOW (ref 8.9–10.3)
Chloride: 90 mmol/L — ABNORMAL LOW (ref 98–111)
Creatinine, Ser: 0.54 mg/dL (ref 0.44–1.00)
GFR calc Af Amer: >60 mL/min (ref >60)
GFR calc non Af Amer: >60 mL/min (ref >60)
Glucose, Bld: 111 mg/dL — ABNORMAL HIGH (ref 70–99)
Potassium: 3 mmol/L — ABNORMAL LOW (ref 3.5–5.1)
Sodium: 135 mmol/L (ref 135–145)
Total Bilirubin: 0.8 mg/dL (ref 0.3–1.2)
Total Protein: 6.2 g/dL — ABNORMAL LOW (ref 6.5–8.1)

## 2018-04-07 LAB — CBC
HCT: 35.4 % — ABNORMAL LOW (ref 36.0–46.0)
Hemoglobin: 11.8 g/dL — ABNORMAL LOW (ref 12.0–15.0)
MCH: 33.7 pg (ref 26.0–34.0)
MCHC: 33.3 g/dL (ref 30.0–36.0)
MCV: 101.1 fL — ABNORMAL HIGH (ref 80.0–100.0)
Platelets: 370 10*3/uL (ref 150–400)
RBC: 3.5 MIL/uL — ABNORMAL LOW (ref 3.87–5.11)
RDW: 11.7 % (ref 11.5–15.5)
WBC: 15.6 10*3/uL — AB (ref 4.0–10.5)
nRBC: 0 % (ref 0.0–0.2)

## 2018-04-07 LAB — URINALYSIS, COMPLETE (UACMP) WITH MICROSCOPIC
Bacteria, UA: NONE SEEN
Bilirubin Urine: NEGATIVE
Glucose, UA: NEGATIVE mg/dL
Ketones, ur: NEGATIVE mg/dL
Leukocytes,Ua: NEGATIVE
Nitrite: NEGATIVE
Protein, ur: NEGATIVE mg/dL
Specific Gravity, Urine: 1.004 — ABNORMAL LOW (ref 1.005–1.030)
pH: 8 (ref 5.0–8.0)

## 2018-04-07 LAB — LIPASE, BLOOD: Lipase: 29 U/L (ref 11–51)

## 2018-04-07 LAB — TROPONIN I: Troponin I: <0.03 ng/mL (ref <0.03)

## 2018-04-07 NOTE — ED Triage Notes (Addendum)
Patient coming [facility] from for GI upset/pain, nausea, reflux, and reduced oral intake/urge to eat.   Patient had back surgery on T10 on Thursday. Patient reports symptoms began then.   Patient reports increased work of breathing/SOB.

## 2018-04-07 NOTE — ED Provider Notes (Signed)
[facility] Emergency Department Provider Note  ____________________________________________   First MD Initiated Contact with Patient 04/07/18 2324     (approximate)  I have reviewed the triage vital signs and the nursing notes.   HISTORY  Chief Complaint Abdominal Pain and Nausea    HPI Ana Washington is a 73 y.o. female with extensive chronic medical history and who had thoracic spine surgery by Dr. Rudene Christians 2 to 3 days ago.  She presents by EMS for evaluation of a variety of issues including persistent nausea and vomiting, inability to tolerate anything by mouth, burning abdominal pain in the upper part of the abdomen as well as a sensation of abdominal distention and mild diffuse tenderness throughout, chest pain that feels both sharp and pressure-like, and general malaise with generalized weakness.  She has shortness of breath but she suffers from bronchiectasis and uses oxygen all the time.  She has been having a normal bowel movement each day but is not been able to eat or drink anything as described previously.  Her symptoms are severe and to be gradually worsening since the surgery.  She reports that her back is sore but better than it was prior to the surgery.  She is not having any dysuria.  She has a history of coronary artery disease and is on Eliquis for atrial fibrillation.  Nothing in particular makes her feel better and moving around makes her feel worse.  Of note, the patient reports that she has a DNR order but it was not brought with her to the emergency department and I cannot confirm at this time whether she has the goldenrod form.        Past Medical History:  Diagnosis Date  . Arthritis   . Atrial fibrillation ([place])   . Bronchiectasis ([place])   . CAD (coronary artery disease)   . CAD (coronary artery disease) 07/30/2017  . Dumping syndrome   . Dyspnea   . Dysrhythmia    afib  . 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 Family  history of adverse reaction to anesthesia    sister [name]  . GERD (gastroesophageal reflux disease)   . Headache    MIGRAINES  . Hypertension   . Lung disease   . Myocardial infarction ([place]) 2007   Non-STEMI  . [name] (postoperative nausea and vomiting)   . Psoriasis   . PUD (peptic ulcer disease)     Patient Active Problem List   Diagnosis Date Noted  . Thoracic radiculitis (Bilateral) 03/29/2018  . Vasovagal episode 03/29/2018  . Closed compression fracture of T10 thoracic vertebra, sequela 03/29/2018  . Abnormal MRI, lumbar spine (12/15/2016) 03/21/2018  . Lumbar compression fractures, sequela (L1, L2, L3, L4, and L5) 03/21/2018  . Closed compression fracture of L1 lumbar vertebra, sequela 03/21/2018  . Closed compression fracture of L2 lumbar vertebra, sequela 03/21/2018  . Closed compression fracture of L3 lumbar vertebra, sequela 03/21/2018  . Closed compression fracture of L4 lumbar vertebra, sequela 03/21/2018  . Closed compression fracture of L5 lumbar vertebra, sequela 03/21/2018  . Thoracic compression fracture, sequela (T5, T9, T10, T11, and T12) 03/21/2018  . Closed compression fracture of T5 thoracic vertebra, sequela 03/21/2018  . Closed compression fracture of T9 thoracic vertebra, sequela 03/21/2018  . Close compression fracture of T11 thoracic vertebra, sequela 03/21/2018  . Closed compression fracture of T12 thoracic vertebra, sequela 03/21/2018  . Lumbar facet hypertrophy 03/21/2018  . Grade 1  Lumbar Anterolisthesis of L3/4 and L4/5 03/21/2018  .  Lumbar central spinal stenosis (Multilevel), 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 claudication 03/21/2018  . Chronic anticoagulation (ELIQUIS) 03/21/2018  . History of pelvic fracture 03/21/2018  . Chronic musculoskeletal pain 03/21/2018  . Neurogenic pain 03/21/2018  . Long term prescription benzodiazepine use 03/21/2018  . 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 Adult bronchiectasis ([place]) 03/01/2018  . Diverticulitis  03/01/2018  . 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[place]) 03/01/2018  . Migraines 03/01/2018  . 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 Psoriasis 03/01/2018  . Chronic upper back pain (Primary Area of Pain) (Bilateral) (R>L) 03/01/2018  . Chronic low back pain (Secondary Area of Pain) (Bilateral) (R>L) w/o sciatica 03/01/2018  . Chronic pain syndrome 03/01/2018  . Long term current use of opiate analgesic 03/01/2018  . Pharmacologic therapy 03/01/2018  . Disorder of skeletal system 03/01/2018  . Problems influencing health status 03/01/2018  . History of kyphoplasty (L1, L2, L3, T9, T11, and T12) 01/05/2018  . Protein-calorie malnutrition, severe 08/03/2017  . GERD (gastroesophageal reflux disease) 07/30/2017  . Pelvic fracture ([place]) 07/30/2017  . Paroxysmal atrial fibrillation ([place]) 07/30/2017  . Other dysphagia 09/13/2016  . Unintended weight loss 09/13/2016  . Essential hypertension 10/03/2013  . Osteoarthritis of knees (Bilateral) 10/03/2013  . Primary localized osteoarthrosis, lower leg 10/03/2013  . Non-ischemic cardiomyopathy ([place]) 09/12/2013  . Coronary artery disease involving native coronary artery of native heart without angina pectoris 07/28/2012  . Hyperlipidemia, mixed 07/28/2012  . DDD (degenerative disc disease), lumbar 02/29/2012    Past Surgical History:  Procedure Laterality Date  . BACK SURGERY    . CHOLECYSTECTOMY    . EYE SURGERY    . FOOT SURGERY    . KYPHOPLASTY N/A 07/05/2016   Procedure: KYPHOPLASTY T - 9;  Surgeon: Hessie Knows, MD;  Location: [facility] ORS;  Service: Orthopedics;  Laterality: N/A;  . KYPHOPLASTY N/A 11/29/2017   Procedure: Iona Hansen;  Surgeon: Hessie Knows, MD;  Location: [facility] ORS;  Service: Orthopedics;  Laterality: N/A;  L2 and L3  . KYPHOPLASTY N/A 12/18/2017   Procedure: KYPHOPLASTY L1;  Surgeon: Hessie Knows, MD;  Location: [facility] ORS;  Service: Orthopedics;  Laterality: N/A;  .  KYPHOPLASTY N/A 01/05/2018   Procedure: KYPHOPLASTY-T11,T12;  Surgeon: Hessie Knows, MD;  Location: [facility] ORS;  Service: Orthopedics;  Laterality: N/A;  . KYPHOPLASTY N/A 04/05/2018   Procedure: T10 KYPHOPLASTY;  Surgeon: Hessie Knows, MD;  Location: [facility] ORS;  Service: Orthopedics;  Laterality: N/A;  . LUNG SURGERY  1990 and 1996  . THOROCOTOMY WITH LOBECTOMY     LEFT LOWER THORACOTOMY / RIGHT MIDDLE LOBECTOMY    Prior to Admission medications   Medication Sig Start Date End Date Taking? Authorizing Provider  acetaminophen (TYLENOL) 500 MG tablet Take 1,000 mg by mouth every 6 (six) hours as needed for mild pain or headache.     [provider]  amLODipine (NORVASC) 5 MG tablet Take 5 mg by mouth daily.  11/04/13   [provider]  apixaban (ELIQUIS) 5 MG TABS tablet Take 5 mg by mouth 2 (two) times daily.     [provider]  Ascorbic Acid (VITAMIN C) 1000 MG tablet Take 1,000 mg by mouth daily.    [provider]  aspirin EC 81 MG tablet Take 81 mg by mouth daily.    [provider]  azithromycin (ZITHROMAX) 250 MG tablet Take 250 mg by mouth daily. continuou 09/09/11   [provider]  Calcium Carbonate-Vitamin D (OYSTER SHELL/VITAMIN D) 600-125 MG-UNIT TABS Take 1 tablet  by mouth daily. 11/05/07   [provider]  chlorpheniramine-HYDROcodone (TUSSIONEX) 10-8 MG/5ML SUER Take 6 mLs by mouth at bedtime as needed for cough.     [provider]  Cholecalciferol (VITAMIN D-1000 MAX ST) 1000 UNITS tablet Take 1,000 Units by mouth daily.     [provider]  ferrous sulfate 325 (65 FE) MG EC tablet Take 325 mg by mouth daily.     [provider]  gabapentin (NEURONTIN) 300 MG capsule Take 300 mg by mouth 3 (three) times daily.     [provider]  levofloxacin (LEVAQUIN) 750 MG tablet Take 750 mg by mouth daily.    [provider]  LORazepam (ATIVAN) 0.5 MG tablet Take 0.5 mg by mouth every 4  (four) hours as needed (breathing issues).     [provider]  losartan (COZAAR) 100 MG tablet Take 100 mg by mouth daily.  07/10/13   [provider]  metoprolol succinate (TOPROL-XL) 50 MG 24 hr tablet Take 50 mg by mouth at bedtime.  10/17/13   [provider]  Multiple Vitamin (MULTIVITAMIN WITH MINERALS) TABS tablet Take 1 tablet by mouth daily. Women's 50+    [provider]  omeprazole (PRILOSEC OTC) 20 MG tablet Take 40 mg by mouth daily.     [provider]  potassium chloride (K-DUR) 10 MEQ tablet Take 10 mEq by mouth daily.    [provider]  predniSONE (DELTASONE) 10 MG tablet TAKE 1 TABLET(10 MG) BY MOUTH DAILY WITH BREAKFAST Patient taking differently: Take 10 mg by mouth daily with breakfast.  02/28/18   Laverle Hobby, MD  Probiotic Product (ALIGN) 4 MG CAPS Take 4 mg by mouth daily.    [provider]  sodium chloride 0.9 % nebulizer solution Take 3 mLs by nebulization 3 (three) times daily.  06/07/16   [provider]  sucralfate (CARAFATE) 1 g tablet Take 1 g by mouth 4 (four) times daily.    [provider]  traMADol (ULTRAM) 50 MG tablet Take 1 tablet (50 mg total) by mouth every 6 (six) hours as needed. 01/06/18   Hessie Knows, MD    Allergies Sulfa antibiotics; Codeine; and Penicillins  Family History  Problem Relation Age of Onset  . Hypertension Mother   . Hypertension Father     Social History Social History   Tobacco Use  . Smoking status: Never Smoker  . Smokeless tobacco: Never Used  Substance Use Topics  . Alcohol use: No  . Drug use: No    Review of Systems Constitutional: No fever/chills.  General malaise and generalized weakness. Eyes: No visual changes. ENT: No sore throat. Cardiovascular: +chest pain. Respiratory: +shortness of breath in the setting of chronic bronchiectasis and chronic use of oxygen. Gastrointestinal: Burning abdominal pain with a sensation  of abdominal distention and nausea and vomiting anytime she tries to eat or drink anything. Genitourinary: Negative for dysuria. Musculoskeletal: Back pain particularly in the thoracic region at the site of the recent surgery. Integumentary: Negative for rash. Neurological: Negative for headaches, focal weakness or numbness.   ____________________________________________   PHYSICAL EXAM:  VITAL SIGNS: ED Triage Vitals  Enc Vitals Group     BP 04/07/18 2316 (!) 170/107     Pulse Rate 04/07/18 2316 95     Resp 04/07/18 2316 (!) 28     Temp 04/07/18 2316 (!) 97.5 F (36.4 C)     Temp src --      SpO2 04/07/18 2316  94 %     Weight 04/07/18 2312 49 kg (108 lb 0.4 oz)     Height --      Head Circumference --      Peak Flow --      Pain Score 04/07/18 2312 5     Pain Loc --      Pain Edu? --      Excl. in [place]? --     Constitutional: Alert and oriented.  Elderly and chronically ill-appearing and does appear to be in moderate distress. Eyes: Conjunctivae are normal.  Head: Atraumatic. Nose: No congestion/rhinnorhea. Mouth/Throat: Mucous membranes are dry. Neck: No stridor.  No meningeal signs.   Cardiovascular: Borderline tachycardia, regular rhythm. Good peripheral circulation. Grossly normal heart sounds. Respiratory: Increased respiratory rate at about 30 respirations per minute.  Mild accessory muscle usage with some intercostal retractions but this seems to be rate related.  Mild coarse breath sounds throughout with no wheezing. Gastrointestinal: Soft with mild and diffuse tenderness to palpation throughout the abdomen.  No distention..  Musculoskeletal: No lower extremity tenderness nor edema. No gross deformities of extremities.  Her thoracic spine is well-appearing with no obvious signs of infection, no cellulitis, no fluctuance nor induration. Neurologic:  Normal speech and language. No gross focal neurologic deficits are appreciated.  Skin:  Skin is warm, dry and intact. No  rash noted. Psychiatric: Mood and affect are normal. Speech and behavior are normal.  ____________________________________________   LABS (all labs ordered are listed, but only abnormal results are displayed)  Labs Reviewed  COMPREHENSIVE METABOLIC PANEL - Abnormal; Notable for the following components:      Result Value   Potassium 3.0 (*)    Chloride 90 (*)    CO2 36 (*)    Glucose, Bld 111 (*)    BUN 7 (*)    Calcium 8.8 (*)    Total Protein 6.2 (*)    All other components within normal limits  CBC - Abnormal; Notable for the following components:   WBC 15.6 (*)    RBC 3.50 (*)    Hemoglobin 11.8 (*)    HCT 35.4 (*)    MCV 101.1 (*)    All other components within normal limits  URINALYSIS, COMPLETE (UACMP) WITH MICROSCOPIC - Abnormal; Notable for the following components:   Color, Urine STRAW (*)    APPearance CLEAR (*)    Specific Gravity, Urine 1.004 (*)    Hgb urine dipstick SMALL (*)    All other components within normal limits  LIPASE, BLOOD  TROPONIN I  MAGNESIUM   ____________________________________________  EKG  ED ECG REPORT I, Hinda Kehr, the attending physician, personally viewed and interpreted this ECG.  Date: 04/07/2018 EKG Time: 23:15 Rate: 94 Rhythm: normal sinus rhythm QRS Axis: normal Intervals: normal ST/T Wave abnormalities: Non-specific ST segment / T-wave changes, but no clear evidence of acute ischemia. Narrative Interpretation: no definitive evidence of acute ischemia; does not meet STEMI criteria.   ____________________________________________  RADIOLOGY I, Hinda Kehr, personally viewed and evaluated these images (plain radiographs) as part of my medical decision making, as well as reviewing the written report by the radiologist.  ED MD interpretation:  No acute abnormalities identified.  Chronic bronchiectasis.  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  No evidence of acute infection or SBO/ileus.  Official  radiology report(s): Ct Angio Chest Pe W And/or Wo Contrast  Result Date: 04/08/2018 CLINICAL DATA:  Gastrointestinal upset with pain, nausea and reflux. Back surgery at T10  on Thursday. EXAM: CT ANGIOGRAPHY CHEST CT ABDOMEN AND PELVIS WITH CONTRAST TECHNIQUE: Multidetector CT imaging of the chest was performed using the standard protocol during bolus administration of intravenous contrast. Multiplanar CT image reconstructions and MIPs were obtained to evaluate the vascular anatomy. Multidetector CT imaging of the abdomen and pelvis was performed using the standard protocol during bolus administration of intravenous contrast. CONTRAST:  83mL OMNIPAQUE IOHEXOL 350 MG/ML SOLN COMPARISON:  04/05/2018 intraoperative views of the thoracic spine, thoracic spine radiographs 03/21/2018 FINDINGS: CTA CHEST FINDINGS Cardiovascular: Cardiomegaly without pericardial effusion. Nonaneurysmal ectatic thoracic aorta measuring up to 3.7 cm. No dissection. Satisfactory pulmonary arterial opacification to the segmental level without acute pulmonary embolus. Great vessels are within normal limits. Mediastinum/Nodes: Small mediastinal lymph nodes without pathologic enlargement. Patent trachea and mainstem bronchi. Slight dilatation of the mid and distal esophagus without focal abnormality. The thyroid gland is unremarkable. Lungs/Pleura: Scarring at the apices. Centrilobular emphysema scattered bilateral multilobar bronchiectasis and scattered areas of inspissated mucus within areas of bronchiectasis are noted in the right upper, right middle and both lower lobes. Eventration of the left hemidiaphragm is present. Musculoskeletal: Lower thoracic kyphoplasties from T9 caudad to at least the L1 level on the thoracic spine series. Osteopenic appearance of the thoracic spine. Mild superior endplate compression of T8 and inferior endplate compression of T7. Review of the MIP images confirms the above findings. CT ABDOMEN and PELVIS FINDINGS  Hepatobiliary: Cholecystectomy. Mild reservoir effect accounting for intrahepatic ductal dilatation. Dilatation of the common bile duct at the pancreatic head to 14 mm in diameter without stones. Pancreas: Unremarkable without ductal dilatation, inflammation or mass. Spleen: Normal in size without focal abnormality. Adrenals/Urinary Tract: Normal bilateral adrenal glands. Simple cyst arising off the upper pole the left kidney measuring up to 3.2 cm. No nephrolithiasis nor hydroureteronephrosis. Urinary bladder is distended without focal mural thickening, intraluminal mass or calculus. Stomach/Bowel: There is a small moderate-sized hiatal hernia. The stomach is decompressed in appearance. There is normal small bowel rotation. No small bowel obstruction or inflammation. Moderate stool retention is noted within the colon. Sigmoid diverticulosis without acute diverticulitis is identified. The appendix is not confidently identified but no pericecal inflammation is seen. Vascular/Lymphatic: Moderate aortoiliac atherosclerosis. No aneurysm. No dissection. No lymphadenopathy. Reproductive: Calcified uterine fibroid.  No adnexal mass. Other: No free air or free fluid.  No hernia. Musculoskeletal: T9 through L3 kyphoplasty without complicating features. Chronic superior endplate compressions of L4 and L5 without kyphoplasty cement. Review of the MIP images confirms the above findings. IMPRESSION: Chest CT: 1. Cardiomegaly with ectatic thoracic aorta. No acute pulmonary embolus. 2. Emphysema of the lungs with bronchiectasis. CT AP: 1. Chronic compression deformities of the thoracic and lumbar spine as above with kyphoplasty changes from T9 through L3. 2. Status post cholecystectomy with reservoir effect accounting for intrahepatic and extrahepatic ductal dilatation. No choledocholithiasis. 3. Sigmoid diverticulosis without acute diverticulitis. 4. Moderate sized hiatal hernia. Eventration of the left hemidiaphragm. No bowel  obstruction or inflammation. 5. Simple cyst arising off the upper the left kidney measuring up to 3.2 cm. 6. Calcified uterine fibroid. Electronically Signed   By: Ashley Royalty M.D.   On: 04/08/2018 03:14   Ct Abdomen Pelvis W Contrast  Result Date: 04/08/2018 CLINICAL DATA:  Gastrointestinal upset with pain, nausea and reflux. Back surgery at T10 on Thursday. EXAM: CT ANGIOGRAPHY CHEST CT ABDOMEN AND PELVIS WITH CONTRAST TECHNIQUE: Multidetector CT imaging of the chest was performed using the standard protocol during bolus administration of intravenous contrast. Multiplanar  CT image reconstructions and MIPs were obtained to evaluate the vascular anatomy. Multidetector CT imaging of the abdomen and pelvis was performed using the standard protocol during bolus administration of intravenous contrast. CONTRAST:  47mL OMNIPAQUE IOHEXOL 350 MG/ML SOLN COMPARISON:  04/05/2018 intraoperative views of the thoracic spine, thoracic spine radiographs 03/21/2018 FINDINGS: CTA CHEST FINDINGS Cardiovascular: Cardiomegaly without pericardial effusion. Nonaneurysmal ectatic thoracic aorta measuring up to 3.7 cm. No dissection. Satisfactory pulmonary arterial opacification to the segmental level without acute pulmonary embolus. Great vessels are within normal limits. Mediastinum/Nodes: Small mediastinal lymph nodes without pathologic enlargement. Patent trachea and mainstem bronchi. Slight dilatation of the mid and distal esophagus without focal abnormality. The thyroid gland is unremarkable. Lungs/Pleura: Scarring at the apices. Centrilobular emphysema scattered bilateral multilobar bronchiectasis and scattered areas of inspissated mucus within areas of bronchiectasis are noted in the right upper, right middle and both lower lobes. Eventration of the left hemidiaphragm is present. Musculoskeletal: Lower thoracic kyphoplasties from T9 caudad to at least the L1 level on the thoracic spine series. Osteopenic appearance of the  thoracic spine. Mild superior endplate compression of T8 and inferior endplate compression of T7. Review of the MIP images confirms the above findings. CT ABDOMEN and PELVIS FINDINGS Hepatobiliary: Cholecystectomy. Mild reservoir effect accounting for intrahepatic ductal dilatation. Dilatation of the common bile duct at the pancreatic head to 14 mm in diameter without stones. Pancreas: Unremarkable without ductal dilatation, inflammation or mass. Spleen: Normal in size without focal abnormality. Adrenals/Urinary Tract: Normal bilateral adrenal glands. Simple cyst arising off the upper pole the left kidney measuring up to 3.2 cm. No nephrolithiasis nor hydroureteronephrosis. Urinary bladder is distended without focal mural thickening, intraluminal mass or calculus. Stomach/Bowel: There is a small moderate-sized hiatal hernia. The stomach is decompressed in appearance. There is normal small bowel rotation. No small bowel obstruction or inflammation. Moderate stool retention is noted within the colon. Sigmoid diverticulosis without acute diverticulitis is identified. The appendix is not confidently identified but no pericecal inflammation is seen. Vascular/Lymphatic: Moderate aortoiliac atherosclerosis. No aneurysm. No dissection. No lymphadenopathy. Reproductive: Calcified uterine fibroid.  No adnexal mass. Other: No free air or free fluid.  No hernia. Musculoskeletal: T9 through L3 kyphoplasty without complicating features. Chronic superior endplate compressions of L4 and L5 without kyphoplasty cement. Review of the MIP images confirms the above findings. IMPRESSION: Chest CT: 1. Cardiomegaly with ectatic thoracic aorta. No acute pulmonary embolus. 2. Emphysema of the lungs with bronchiectasis. CT AP: 1. Chronic compression deformities of the thoracic and lumbar spine as above with kyphoplasty changes from T9 through L3. 2. Status post cholecystectomy with reservoir effect accounting for intrahepatic and extrahepatic  ductal dilatation. No choledocholithiasis. 3. Sigmoid diverticulosis without acute diverticulitis. 4. Moderate sized hiatal hernia. Eventration of the left hemidiaphragm. No bowel obstruction or inflammation. 5. Simple cyst arising off the upper the left kidney measuring up to 3.2 cm. 6. Calcified uterine fibroid. Electronically Signed   By: Ashley Royalty M.D.   On: 04/08/2018 03:14   Ct T-spine No Charge  Result Date: 04/08/2018 CLINICAL DATA:  Chest pain. Status post multiple kyphoplasties, most recent April 05, 2018. EXAM: CT THORACIC SPINE WITHOUT CONTRAST TECHNIQUE: Reformatted CT images of the thoracic were obtained using the standard protocol without intravenous contrast. COMPARISON:  Thoracolumbar spine radiographs March 21 2018 FINDINGS: ALIGNMENT: Maintained thoracic kyphosis. No malalignment. VERTEBRAE: Status post T9 through L1 vertebral body cement augmentation, new at T10. Mild T1, T5, T7 and moderate T8 compression fractures, new at T7. Limited comparison of  small upper thoracic compression fractures on prior radiographs. Osteopenia. No destructive bony lesions. PARASPINAL AND OTHER SOFT TISSUES: Please see CT of chest from same day, reported separately. DISC LEVELS: No canal stenosis. Severe LEFT T9-10, moderate T10-11 and T11-12 neural foraminal narrowing. IMPRESSION: 1. Status post T9 through L1 vertebral bodies cement augmentation. 2. Mild T1, T5, T7 and moderate T8 compression fractures, new at T7. 3. Osteopenia. Electronically Signed   By: Elon Alas M.D.   On: 04/08/2018 03:24    ____________________________________________   PROCEDURES   Procedure(s) performed (including Critical Care):  Procedures   ____________________________________________   INITIAL IMPRESSION / MDM / [place] / ED COURSE  As part of my medical decision making, I reviewed the following data within the [place] History obtained from family, Labs reviewed , EKG  interpreted , Old chart reviewed, Discussed with admitting physician  and Notes from prior ED visits         Differential diagnosis includes, but is not limited to, anyone of a number of postoperative complications including SBO/ileus, acute infection, pulmonary embolism (she had to go off of her Eliquis for a few days prior to the surgery), healthcare/ventilator associated pneumonia in the setting of recent surgery, general malaise and generalized failure to thrive.  The patient is ill-appearing but obviously suffers from multiple chronic issues as well.  Vital signs are notable for elevated blood pressure and an increased respiratory rate.  She is on oxygen at baseline.  Lab work is notable for a negative troponin, and normal lipase, comprehensive metabolic panel with hypokalemia at 3.0 but a normal creatinine.  She has a leukocytosis of 15.6 but looking back through her records she had a leukocytosis of greater than 20 just a few days ago.  I think ileus is likely based on the nausea and vomiting and generalized abdominal pain.  However she had to go off of the Eliquis and has a history of paroxysmal atrial fibrillation, she just had orthopedic/spinal surgery and has been increasingly immobilized, and she is having tachypnea, increased difficulty breathing, and chest pain.  I will obtain a CTA chest to rule out PE as well as to evaluate the lung parenchyma and a CT of the abdomen and pelvis with IV contrast (venous phase) to further evaluate for possible ileus/SBO.  I have also asked the radiologist to recon the thoracic spine given the recent surgery of that area.  I am giving normal saline 500 mL IV bolus.  The patient declines any pain medicine or nausea medicine at this time.  Clinical Course as of Apr 08 606  Sun Apr 08, 2018  0405 I updated the patient with the results of her scans.  She would rather go home even though she still feels nauseated.  I do not have a specific diagnosis for which  treatment other than intractable nausea and vomiting but she wants to try going home.  I am giving her the IV fluids previously ordered as well as another 4 mg of Zofran and 40 mEq of oral potassium.  I will then reassess.   [CF]  [id number] I reassessed the patient again.  She has been unable to tolerate anything by mouth.  Her stomach is still "messed up" with some dull aching and persistent nausea after 2 rounds of Zofran.  I have ordered Reglan 5 mg IV.  I had an extended discussion with her but she cannot tolerate anything by mouth and has persistent and intractable nausea and vomiting  anytime she tries to take oral intake.  I have ordered a 100 mL/h normal saline infusion in addition to the Reglan and have paged Dr. Marcille Blanco with the hospitalist service to discuss in-hospital observation for intractable nausea and vomiting.  The patient reluctantly agrees and the family strongly agrees with the plan.   [CF]  2409 No indication of UTI.  Urinalysis, Complete w Microscopic(!) [CF]  0603 Discussed case by phone with Dr. Marcille Blanco who will admit.   [CF]    Clinical Course User Index [CF] Hinda Kehr, MD    ____________________________________________  FINAL CLINICAL IMPRESSION(S) / ED DIAGNOSES  Final diagnoses:  Post-operative pain  Intractable nausea and vomiting  Generalized abdominal pain  Hiatal hernia     MEDICATIONS GIVEN DURING THIS VISIT:  Medications  metoCLOPramide (REGLAN) injection 5 mg (has no administration in time range)  0.9 %  sodium chloride infusion (has no administration in time range)  potassium chloride 10 mEq in 100 mL IVPB (has no administration in time range)  sodium chloride 0.9 % bolus 500 mL (500 mLs Intravenous New Bag/Given 04/08/18 0412)  iohexol (OMNIPAQUE) 350 MG/ML injection 75 mL (75 mLs Intravenous Contrast Given 04/08/18 0048)  morphine 4 MG/ML injection 4 mg (4 mg Intravenous Given 04/08/18 0103)  ondansetron (ZOFRAN) injection 4 mg (4 mg Intravenous Given  04/08/18 0102)  potassium chloride (KLOR-CON) packet 40 mEq (40 mEq Oral Given 04/08/18 0416)  ondansetron (ZOFRAN) injection 4 mg (4 mg Intravenous Given 04/08/18 0413)  potassium chloride 10 mEq in 100 mL IVPB (10 mEq Intravenous New Bag/Given 04/08/18 0507)     ED Discharge Orders    None       Note:  This document was prepared using Dragon voice recognition software and may include unintentional dictation errors.   Hinda Kehr, MD 04/08/18 [phone]

## 2018-04-08 ENCOUNTER — Emergency Department: Payer: Medicare Other

## 2018-04-08 ENCOUNTER — Encounter: Payer: Self-pay | Admitting: Radiology

## 2018-04-08 ENCOUNTER — Other Ambulatory Visit: Payer: Self-pay

## 2018-04-08 DIAGNOSIS — I10 Essential (primary) hypertension: Secondary | ICD-10-CM | POA: Diagnosis present

## 2018-04-08 DIAGNOSIS — G894 Chronic pain syndrome: Secondary | ICD-10-CM | POA: Diagnosis present

## 2018-04-08 DIAGNOSIS — R079 Chest pain, unspecified: Secondary | ICD-10-CM | POA: Diagnosis present

## 2018-04-08 DIAGNOSIS — R112 Nausea with vomiting, unspecified: Secondary | ICD-10-CM | POA: Diagnosis not present

## 2018-04-08 DIAGNOSIS — J479 Bronchiectasis, uncomplicated: Secondary | ICD-10-CM | POA: Diagnosis present

## 2018-04-08 DIAGNOSIS — E876 Hypokalemia: Secondary | ICD-10-CM | POA: Diagnosis present

## 2018-04-08 DIAGNOSIS — E878 Other disorders of electrolyte and fluid balance, not elsewhere classified: Secondary | ICD-10-CM | POA: Diagnosis present

## 2018-04-08 DIAGNOSIS — K219 Gastro-esophageal reflux disease without esophagitis: Secondary | ICD-10-CM | POA: Diagnosis present

## 2018-04-08 DIAGNOSIS — Z7901 Long term (current) use of anticoagulants: Secondary | ICD-10-CM | POA: Diagnosis not present

## 2018-04-08 DIAGNOSIS — I2510 Atherosclerotic heart disease of native coronary artery without angina pectoris: Secondary | ICD-10-CM | POA: Diagnosis present

## 2018-04-08 DIAGNOSIS — I4891 Unspecified atrial fibrillation: Secondary | ICD-10-CM | POA: Diagnosis not present

## 2018-04-08 DIAGNOSIS — R140 Abdominal distension (gaseous): Secondary | ICD-10-CM | POA: Diagnosis present

## 2018-04-08 DIAGNOSIS — E871 Hypo-osmolality and hyponatremia: Secondary | ICD-10-CM | POA: Diagnosis present

## 2018-04-08 DIAGNOSIS — K449 Diaphragmatic hernia without obstruction or gangrene: Secondary | ICD-10-CM | POA: Diagnosis present

## 2018-04-08 DIAGNOSIS — S22070A Wedge compression fracture of T9-T10 vertebra, initial encounter for closed fracture: Secondary | ICD-10-CM | POA: Diagnosis present

## 2018-04-08 DIAGNOSIS — X58XXXA Exposure to other specified factors, initial encounter: Secondary | ICD-10-CM | POA: Diagnosis present

## 2018-04-08 DIAGNOSIS — I480 Paroxysmal atrial fibrillation: Secondary | ICD-10-CM | POA: Diagnosis present

## 2018-04-08 DIAGNOSIS — Z66 Do not resuscitate: Secondary | ICD-10-CM | POA: Diagnosis present

## 2018-04-08 DIAGNOSIS — M549 Dorsalgia, unspecified: Secondary | ICD-10-CM | POA: Diagnosis present

## 2018-04-08 DIAGNOSIS — K2970 Gastritis, unspecified, without bleeding: Secondary | ICD-10-CM | POA: Diagnosis present

## 2018-04-08 DIAGNOSIS — I482 Chronic atrial fibrillation, unspecified: Secondary | ICD-10-CM | POA: Diagnosis present

## 2018-04-08 DIAGNOSIS — S22060A Wedge compression fracture of T7-T8 vertebra, initial encounter for closed fracture: Secondary | ICD-10-CM | POA: Diagnosis present

## 2018-04-08 DIAGNOSIS — I252 Old myocardial infarction: Secondary | ICD-10-CM | POA: Diagnosis not present

## 2018-04-08 DIAGNOSIS — J9610 Chronic respiratory failure, unspecified whether with hypoxia or hypercapnia: Secondary | ICD-10-CM | POA: Diagnosis present

## 2018-04-08 DIAGNOSIS — Z9981 Dependence on supplemental oxygen: Secondary | ICD-10-CM | POA: Diagnosis not present

## 2018-04-08 DIAGNOSIS — G8918 Other acute postprocedural pain: Secondary | ICD-10-CM | POA: Diagnosis present

## 2018-04-08 DIAGNOSIS — I5181 Takotsubo syndrome: Secondary | ICD-10-CM | POA: Diagnosis present

## 2018-04-08 DIAGNOSIS — F419 Anxiety disorder, unspecified: Secondary | ICD-10-CM | POA: Diagnosis present

## 2018-04-08 DIAGNOSIS — M1990 Unspecified osteoarthritis, unspecified site: Secondary | ICD-10-CM | POA: Diagnosis present

## 2018-04-08 DIAGNOSIS — R5381 Other malaise: Secondary | ICD-10-CM | POA: Diagnosis present

## 2018-04-08 LAB — BASIC METABOLIC PANEL
Anion gap: 11 (ref 5–15)
BUN: 6 mg/dL — ABNORMAL LOW (ref 8–23)
CALCIUM: 9.3 mg/dL (ref 8.9–10.3)
CO2: 31 mmol/L (ref 22–32)
Chloride: 93 mmol/L — ABNORMAL LOW (ref 98–111)
Creatinine, Ser: 0.48 mg/dL (ref 0.44–1.00)
GFR calc Af Amer: >60 mL/min (ref >60)
GFR calc non Af Amer: >60 mL/min (ref >60)
Glucose, Bld: 117 mg/dL — ABNORMAL HIGH (ref 70–99)
Potassium: 3.6 mmol/L (ref 3.5–5.1)
Sodium: 135 mmol/L (ref 135–145)

## 2018-04-08 LAB — CBC
HCT: 32.9 % — ABNORMAL LOW (ref 36.0–46.0)
Hemoglobin: 10.9 g/dL — ABNORMAL LOW (ref 12.0–15.0)
MCH: 33.3 pg (ref 26.0–34.0)
MCHC: 33.1 g/dL (ref 30.0–36.0)
MCV: 100.6 fL — ABNORMAL HIGH (ref 80.0–100.0)
Platelets: 345 10*3/uL (ref 150–400)
RBC: 3.27 MIL/uL — ABNORMAL LOW (ref 3.87–5.11)
RDW: 11.7 % (ref 11.5–15.5)
WBC: 13.9 10*3/uL — ABNORMAL HIGH (ref 4.0–10.5)
nRBC: 0 % (ref 0.0–0.2)

## 2018-04-08 LAB — MRSA PCR SCREENING: MRSA by PCR: NEGATIVE

## 2018-04-08 LAB — PHOSPHORUS: PHOSPHORUS: 2.8 mg/dL (ref 2.5–4.6)

## 2018-04-08 LAB — MAGNESIUM
MAGNESIUM: 2 mg/dL (ref 1.7–2.4)
Magnesium: 1.8 mg/dL (ref 1.7–2.4)

## 2018-04-08 LAB — TSH: TSH: 1.009 u[IU]/mL (ref 0.350–4.500)

## 2018-04-08 LAB — GLUCOSE, CAPILLARY: Glucose-Capillary: 122 mg/dL — ABNORMAL HIGH (ref 70–99)

## 2018-04-08 MED ORDER — IOHEXOL 350 MG/ML SOLN
75.0000 mL | Freq: Once | INTRAVENOUS | Status: AC | PRN
  Administered 2018-04-08: 75 mL via INTRAVENOUS

## 2018-04-08 MED ORDER — LACTATED RINGERS IV BOLUS
1000.0000 mL | Freq: Once | INTRAVENOUS | Status: AC
  Administered 2018-04-08: 850 mL via INTRAVENOUS

## 2018-04-08 MED ORDER — PANTOPRAZOLE SODIUM 40 MG IV SOLR
40.0000 mg | Freq: Two times a day (BID) | INTRAVENOUS | Status: DC
  Administered 2018-04-08 (×2): 40 mg via INTRAVENOUS
  Filled 2018-04-08 (×2): qty 40

## 2018-04-08 MED ORDER — AZITHROMYCIN 250 MG PO TABS
250.0000 mg | ORAL_TABLET | Freq: Every day | ORAL | Status: DC
  Filled 2018-04-08: qty 1

## 2018-04-08 MED ORDER — PROCHLORPERAZINE EDISYLATE 10 MG/2ML IJ SOLN
10.0000 mg | INTRAMUSCULAR | Status: DC | PRN
  Filled 2018-04-08: qty 2

## 2018-04-08 MED ORDER — MORPHINE SULFATE (PF) 2 MG/ML IV SOLN: 2.0000 mg | INTRAVENOUS | Status: DC | PRN

## 2018-04-08 MED ORDER — ACETAMINOPHEN 650 MG RE SUPP: 650.0000 mg | Freq: Four times a day (QID) | RECTAL | Status: DC | PRN

## 2018-04-08 MED ORDER — DILTIAZEM HCL-DEXTROSE 100-5 MG/100ML-% IV SOLN (PREMIX)
5.0000 mg/h | INTRAVENOUS | Status: AC
  Administered 2018-04-08: 5 mg/h via INTRAVENOUS
  Administered 2018-04-08: 15 mg/h via INTRAVENOUS
  Filled 2018-04-08 (×4): qty 100

## 2018-04-08 MED ORDER — POTASSIUM CHLORIDE 10 MEQ/100ML IV SOLN
10.0000 meq | Freq: Once | INTRAVENOUS | Status: AC
  Administered 2018-04-08: 10 meq via INTRAVENOUS
  Filled 2018-04-08: qty 100

## 2018-04-08 MED ORDER — SODIUM CHLORIDE 0.9 % IV SOLN
Freq: Once | INTRAVENOUS | Status: AC
  Administered 2018-04-08: 07:00:00 via INTRAVENOUS

## 2018-04-08 MED ORDER — RISAQUAD PO CAPS
1.0000 | ORAL_CAPSULE | Freq: Every day | ORAL | Status: DC
  Administered 2018-04-08 – 2018-04-14 (×6): 1 via ORAL
  Filled 2018-04-08 (×7): qty 1

## 2018-04-08 MED ORDER — PREDNISONE 10 MG PO TABS
10.0000 mg | ORAL_TABLET | Freq: Every day | ORAL | Status: DC
  Administered 2018-04-08 – 2018-04-14 (×7): 10 mg via ORAL
  Filled 2018-04-08 (×7): qty 1

## 2018-04-08 MED ORDER — MORPHINE SULFATE (PF) 4 MG/ML IV SOLN
4.0000 mg | Freq: Once | INTRAVENOUS | Status: AC
  Administered 2018-04-08: 4 mg via INTRAVENOUS

## 2018-04-08 MED ORDER — SODIUM CHLORIDE 0.9 % IV BOLUS
500.0000 mL | Freq: Once | INTRAVENOUS | Status: AC
  Administered 2018-04-08: 500 mL via INTRAVENOUS

## 2018-04-08 MED ORDER — METOCLOPRAMIDE HCL 5 MG/ML IJ SOLN
5.0000 mg | INTRAMUSCULAR | Status: AC
  Administered 2018-04-08: 5 mg via INTRAVENOUS
  Filled 2018-04-08: qty 2

## 2018-04-08 MED ORDER — POTASSIUM CHLORIDE 20 MEQ PO PACK
40.0000 meq | PACK | ORAL | Status: AC
  Administered 2018-04-08: 40 meq via ORAL
  Filled 2018-04-08: qty 2

## 2018-04-08 MED ORDER — ASPIRIN EC 81 MG PO TBEC
81.0000 mg | DELAYED_RELEASE_TABLET | Freq: Every day | ORAL | Status: DC
  Administered 2018-04-08 – 2018-04-14 (×7): 81 mg via ORAL
  Filled 2018-04-08 (×7): qty 1

## 2018-04-08 MED ORDER — POTASSIUM CHLORIDE 10 MEQ/100ML IV SOLN
10.0000 meq | INTRAVENOUS | Status: AC
  Administered 2018-04-08 (×4): 10 meq via INTRAVENOUS
  Filled 2018-04-08 (×5): qty 100

## 2018-04-08 MED ORDER — ONDANSETRON HCL 4 MG/2ML IJ SOLN
INTRAMUSCULAR | Status: AC
  Filled 2018-04-08: qty 2

## 2018-04-08 MED ORDER — LOSARTAN POTASSIUM 50 MG PO TABS
100.0000 mg | ORAL_TABLET | Freq: Every day | ORAL | Status: DC
  Administered 2018-04-08 – 2018-04-14 (×6): 100 mg via ORAL
  Filled 2018-04-08 (×7): qty 2

## 2018-04-08 MED ORDER — SUCRALFATE 1 G PO TABS
1.0000 g | ORAL_TABLET | Freq: Three times a day (TID) | ORAL | Status: DC
  Administered 2018-04-08 – 2018-04-14 (×22): 1 g via ORAL
  Filled 2018-04-08 (×21): qty 1

## 2018-04-08 MED ORDER — ALUM & MAG HYDROXIDE-SIMETH 200-200-20 MG/5ML PO SUSP
30.0000 mL | Freq: Four times a day (QID) | ORAL | Status: DC | PRN
  Administered 2018-04-09: 30 mL via ORAL
  Filled 2018-04-08: qty 30

## 2018-04-08 MED ORDER — MORPHINE SULFATE (PF) 4 MG/ML IV SOLN
INTRAVENOUS | Status: AC
  Administered 2018-04-08: 4 mg via INTRAVENOUS
  Filled 2018-04-08: qty 1

## 2018-04-08 MED ORDER — DILTIAZEM HCL 30 MG PO TABS
30.0000 mg | ORAL_TABLET | Freq: Once | ORAL | Status: AC
  Administered 2018-04-08: 30 mg via ORAL
  Filled 2018-04-08: qty 1

## 2018-04-08 MED ORDER — LEVOFLOXACIN 500 MG PO TABS: 750.0000 mg | ORAL_TABLET | Freq: Every day | ORAL | Status: DC

## 2018-04-08 MED ORDER — METOPROLOL SUCCINATE ER 50 MG PO TB24
50.0000 mg | ORAL_TABLET | Freq: Every day | ORAL | Status: DC
  Administered 2018-04-08 – 2018-04-13 (×6): 50 mg via ORAL
  Filled 2018-04-08 (×6): qty 1

## 2018-04-08 MED ORDER — DILTIAZEM HCL 25 MG/5ML IV SOLN
10.0000 mg | Freq: Once | INTRAVENOUS | Status: AC
  Administered 2018-04-08: 10 mg via INTRAVENOUS
  Filled 2018-04-08: qty 5

## 2018-04-08 MED ORDER — FENTANYL CITRATE (PF) 100 MCG/2ML IJ SOLN
25.0000 ug | Freq: Once | INTRAMUSCULAR | Status: AC
  Administered 2018-04-08: 25 ug via INTRAVENOUS

## 2018-04-08 MED ORDER — DILTIAZEM HCL 30 MG PO TABS: 30.0000 mg | ORAL_TABLET | Freq: Four times a day (QID) | ORAL | Status: DC

## 2018-04-08 MED ORDER — POTASSIUM CHLORIDE CRYS ER 10 MEQ PO TBCR
10.0000 meq | EXTENDED_RELEASE_TABLET | Freq: Every day | ORAL | Status: DC
  Administered 2018-04-08 – 2018-04-14 (×7): 10 meq via ORAL
  Filled 2018-04-08 (×9): qty 1

## 2018-04-08 MED ORDER — LACTATED RINGERS IV SOLN
INTRAVENOUS | Status: DC
  Administered 2018-04-08: 19:00:00 via INTRAVENOUS

## 2018-04-08 MED ORDER — DOCUSATE SODIUM 100 MG PO CAPS
100.0000 mg | ORAL_CAPSULE | Freq: Two times a day (BID) | ORAL | Status: DC
  Administered 2018-04-08 – 2018-04-13 (×10): 100 mg via ORAL
  Filled 2018-04-08 (×12): qty 1

## 2018-04-08 MED ORDER — ACETAMINOPHEN 325 MG PO TABS
650.0000 mg | ORAL_TABLET | Freq: Four times a day (QID) | ORAL | Status: DC | PRN
  Administered 2018-04-08: 650 mg via ORAL
  Administered 2018-04-09 (×2): 325 mg via ORAL
  Administered 2018-04-12 – 2018-04-13 (×3): 650 mg via ORAL
  Filled 2018-04-08 (×6): qty 2

## 2018-04-08 MED ORDER — ONDANSETRON HCL 4 MG PO TABS: 4.0000 mg | ORAL_TABLET | Freq: Four times a day (QID) | ORAL | Status: DC | PRN

## 2018-04-08 MED ORDER — LORAZEPAM 0.5 MG PO TABS
0.5000 mg | ORAL_TABLET | ORAL | Status: DC | PRN
  Administered 2018-04-08 – 2018-04-14 (×22): 0.5 mg via ORAL
  Filled 2018-04-08 (×22): qty 1

## 2018-04-08 MED ORDER — ONDANSETRON HCL 4 MG/2ML IJ SOLN
4.0000 mg | Freq: Once | INTRAMUSCULAR | Status: AC
  Administered 2018-04-08: 4 mg via INTRAVENOUS

## 2018-04-08 MED ORDER — ONDANSETRON HCL 4 MG/2ML IJ SOLN
4.0000 mg | Freq: Four times a day (QID) | INTRAMUSCULAR | Status: DC | PRN
  Administered 2018-04-12: 4 mg via INTRAVENOUS
  Filled 2018-04-08: qty 2

## 2018-04-08 MED ORDER — FENTANYL CITRATE (PF) 100 MCG/2ML IJ SOLN
25.0000 ug | INTRAMUSCULAR | Status: DC | PRN
  Administered 2018-04-08 – 2018-04-10 (×5): 25 ug via INTRAVENOUS
  Filled 2018-04-08 (×5): qty 2

## 2018-04-08 MED ORDER — POTASSIUM CHLORIDE IN NACL 40-0.9 MEQ/L-% IV SOLN
INTRAVENOUS | Status: DC
  Administered 2018-04-08: 100 mL/h via INTRAVENOUS
  Filled 2018-04-08 (×2): qty 1000

## 2018-04-08 MED ORDER — PANTOPRAZOLE SODIUM 40 MG PO TBEC
40.0000 mg | DELAYED_RELEASE_TABLET | Freq: Every day | ORAL | Status: DC
  Administered 2018-04-08: 40 mg via ORAL
  Filled 2018-04-08: qty 1

## 2018-04-08 MED ORDER — MORPHINE SULFATE (PF) 4 MG/ML IV SOLN: 4.0000 mg | INTRAVENOUS | Status: DC | PRN

## 2018-04-08 MED ORDER — VITAMIN C 500 MG PO TABS
1000.0000 mg | ORAL_TABLET | Freq: Every day | ORAL | Status: DC
  Administered 2018-04-09 – 2018-04-14 (×5): 1000 mg via ORAL
  Filled 2018-04-08 (×5): qty 2

## 2018-04-08 MED ORDER — ONDANSETRON HCL 4 MG/2ML IJ SOLN
4.0000 mg | INTRAMUSCULAR | Status: AC
  Administered 2018-04-08: 4 mg via INTRAVENOUS
  Filled 2018-04-08: qty 2

## 2018-04-08 MED ORDER — AMLODIPINE BESYLATE 5 MG PO TABS
5.0000 mg | ORAL_TABLET | Freq: Every day | ORAL | Status: DC
  Administered 2018-04-08: 5 mg via ORAL
  Filled 2018-04-08: qty 1

## 2018-04-08 MED ORDER — APIXABAN 5 MG PO TABS
5.0000 mg | ORAL_TABLET | Freq: Two times a day (BID) | ORAL | Status: DC
  Administered 2018-04-08 – 2018-04-10 (×5): 5 mg via ORAL
  Filled 2018-04-08 (×5): qty 1

## 2018-04-08 NOTE — Consult Note (Signed)
PULMONARY / CRITICAL CARE MEDICINE  Name: Ana Washington MRN: [id number] DOB: 1945/10/06    LOS: 0  Referring Provider: Dr. Marcille Blanco Reason for Referral: A. fib with RVR requiring IV antiarrhythmics HPI: This is a 73 year old female with a history of chronic A. fib on Eliquis initially admitted with intractable nausea and vomiting hypokalemia and bronchiectasis.  Upon arrival on the floor, patient developed A. fib with RVR and a rapid response was called.  She was transferred to the ICU for IV diltiazem.  Past Medical History:  Diagnosis Date  . Arthritis   . Atrial fibrillation ([place])   . Bronchiectasis ([place])   . CAD (coronary artery disease)   . CAD (coronary artery disease) 07/30/2017  . Dumping syndrome   . Dyspnea   . Dysrhythmia    afib  . 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 Family history of adverse reaction to anesthesia    sister [name]  . GERD (gastroesophageal reflux disease)   . Headache    MIGRAINES  . Hypertension   . Lung disease   . Myocardial infarction ([place]) 2007   Non-STEMI  . [name] (postoperative nausea and vomiting)   . Psoriasis   . PUD (peptic ulcer disease)    Past Surgical History:  Procedure Laterality Date  . BACK SURGERY    . CHOLECYSTECTOMY    . EYE SURGERY    . FOOT SURGERY    . KYPHOPLASTY N/A 07/05/2016   Procedure: KYPHOPLASTY T - 9;  Surgeon: Hessie Knows, MD;  Location: [facility] ORS;  Service: Orthopedics;  Laterality: N/A;  . KYPHOPLASTY N/A 11/29/2017   Procedure: Iona Hansen;  Surgeon: Hessie Knows, MD;  Location: [facility] ORS;  Service: Orthopedics;  Laterality: N/A;  L2 and L3  . KYPHOPLASTY N/A 12/18/2017   Procedure: KYPHOPLASTY L1;  Surgeon: Hessie Knows, MD;  Location: [facility] ORS;  Service: Orthopedics;  Laterality: N/A;  . KYPHOPLASTY N/A 01/05/2018   Procedure: KYPHOPLASTY-T11,T12;  Surgeon: Hessie Knows, MD;  Location: [facility] ORS;  Service: Orthopedics;  Laterality: N/A;  . KYPHOPLASTY N/A 04/05/2018   Procedure: T10  KYPHOPLASTY;  Surgeon: Hessie Knows, MD;  Location: [facility] ORS;  Service: Orthopedics;  Laterality: N/A;  . LUNG SURGERY  1990 and 1996  . THOROCOTOMY WITH LOBECTOMY     LEFT LOWER THORACOTOMY / RIGHT MIDDLE LOBECTOMY   Prior to Admission medications   Medication Sig Start Date End Date Taking? Authorizing Provider  amLODipine (NORVASC) 5 MG tablet Take 5 mg by mouth daily.   Yes [provider]  clopidogrel (PLAVIX) 75 MG tablet Take 75 mg by mouth daily.   Yes [provider]  donepezil (ARICEPT) 5 MG tablet Take 1 tablet (5 mg total) by mouth at bedtime. 09/25/17 11/04/17 Yes Sowles, Drue Stager, MD  empagliflozin (JARDIANCE) 25 MG TABS tablet Take 25 mg by mouth daily.   Yes [provider]  glycopyrrolate (ROBINUL) 1 MG tablet Take 1 mg by mouth 2 (two) times daily.   Yes [provider]  insulin aspart (NOVOLOG FLEXPEN) 100 UNIT/ML FlexPen Inject 12 Units into the skin 2 (two) times daily.   Yes [provider]  insulin aspart (NOVOLOG) 100 UNIT/ML FlexPen Inject 18 Units into the skin daily. At 1700   Yes [provider]  Insulin Degludec-Liraglutide (XULTOPHY) 100-3.6 UNIT-MG/ML SOPN Inject 50 Units into the skin daily.   Yes [provider]  levETIRAcetam (KEPPRA) 500 MG tablet Take 500 mg by mouth 2 (two) times daily.   Yes [provider]  lipase/protease/amylase (  CREON) 12000 units CPEP capsule Take 6,000 Units by mouth 3 (three) times daily before meals.   Yes [provider]  lipase/protease/amylase (CREON) 12000 units CPEP capsule Take 3,000 Units by mouth at bedtime. With snack   Yes [provider]  lisinopril (PRINIVIL,ZESTRIL) 5 MG tablet Take 5 mg by mouth daily.   Yes [provider]  metoprolol succinate (TOPROL-XL) 25 MG 24 hr tablet Take 1 tablet (25 mg total) by mouth daily. 09/25/17  Yes Sowles, Drue Stager, MD  rosuvastatin (CRESTOR) 40 MG tablet Take 1 tablet (40 mg total) by mouth  daily. 09/25/17 11/04/17 Yes Steele Sizer, MD  aspirin EC 81 MG tablet Take 81 mg by mouth daily.    [provider]  famotidine (PEPCID) 20 MG tablet Take 1 tablet (20 mg total) by mouth 2 (two) times daily. 09/25/17 10/25/17  Steele Sizer, MD  gabapentin (NEURONTIN) 300 MG capsule Take 1 capsule (300 mg total) by mouth 2 (two) times daily. 09/25/17 10/25/17  Steele Sizer, MD  insulin glargine (LANTUS) 100 UNIT/ML injection Inject 0.1 mLs (10 Units total) into the skin daily. 09/25/17 10/25/17  Steele Sizer, MD  lacosamide 100 MG TABS Take 1 tablet (100 mg total) by mouth 2 (two) times daily. Patient not taking: Reported on 11/04/2017 02/10/17   Fritzi Mandes, MD  promethazine (PHENERGAN) 12.5 MG tablet Take 1 tablet (12.5 mg total) by mouth every 6 (six) hours as needed for nausea or vomiting. Patient not taking: Reported on 11/04/2017 11/29/16   Stark Klein, MD  sertraline (ZOLOFT) 25 MG tablet Take 1 tablet (25 mg total) by mouth daily. Patient not taking: Reported on 11/04/2017 09/25/17   Steele Sizer, MD   Allergies Allergies  Allergen Reactions  . Sulfa Antibiotics Diarrhea  . Codeine Nausea And Vomiting  . Penicillins Rash    Has patient had a PCN reaction causing immediate rash, facial/tongue/throat swelling, SOB or lightheadedness with hypotension: Unknown Has patient had a PCN reaction causing severe rash involving mucus membranes or skin necrosis: Unknown Has patient had a PCN reaction that required hospitalization: Unknown Has patient had a PCN reaction occurring within the last 10 years: No If all of the above answers are "NO", then may proceed with Cephalosporin use.     Family History Family History  Problem Relation Age of Onset  . Hypertension Mother   . Hypertension Father    Social History  reports that she has never smoked. She has never used smokeless tobacco. She reports that she does not drink alcohol or use drugs.  Review Of Systems:    Constitutional: Negative for fever and chills.  HENT: Negative for congestion and rhinorrhea.  Eyes: Negative for redness and visual disturbance.  Respiratory: Negative for shortness of breath and wheezing.  Cardiovascular: Negative for chest pain and palpitations.  Gastrointestinal: Reports nausea and vomiting that has resolved with antiemetics Genitourinary: Negative for dysuria and urgency.  Endocrine: Denies polyuria, polyphagia and heat intolerance Musculoskeletal: Negative for myalgias and arthralgias.  Skin: Negative for pallor and wound.  Neurological: Negative for dizziness and headaches   VITAL SIGNS: BP 130/78   Pulse (!) 111   Temp 98.6 F (37 C)   Resp (!) 28   Ht 5' (1.524 m)   Wt 47.3 kg   SpO2 97%   BMI 20.37 kg/m   HEMODYNAMICS:    VENTILATOR SETTINGS:    INTAKE / OUTPUT: I/O last 3 completed shifts: In: 1473.9 [IV Piggyback:1473.9] Out: 1800 [Urine:1800]  PHYSICAL EXAMINATION: General: Awake,  pleasant, no acute distress HEENT: PERRLA, trachea midline, no JVD Neuro: Alert and oriented x4, no focal deficits Cardiovascular: Apical pulse irregular, S1-S2, no murmur regurg or gallop Lungs: Clear to auscultation bilaterally Abdomen: Nondistended, normal bowel sounds in all 4 quadrants, palpation reveals no organomegaly Musculoskeletal: Stiff range of motion, no joint deformities Skin: Warm and dry  LABS:  BMET Recent Labs  Lab 04/07/18 2322  NA 135  K 3.0*  CL 90*  CO2 36*  BUN 7*  CREATININE 0.54  GLUCOSE 111*    Electrolytes Recent Labs  Lab 04/07/18 2322  CALCIUM 8.8*  MG 2.0    CBC Recent Labs  Lab 04/05/18 1109 04/07/18 2322  WBC 21.4* 15.6*  HGB 12.5 11.8*  HCT 38.4 35.4*  PLT 431* 370    Coag's No results for input(s): APTT, INR in the last 168 hours.  Sepsis Markers No results for input(s): LATICACIDVEN, PROCALCITON, O2SATVEN in the last 168 hours.  ABG No results for input(s): PHART, PCO2ART, PO2ART in the  last 168 hours.  Liver Enzymes Recent Labs  Lab 04/07/18 2322  AST 24  ALT 13  ALKPHOS 74  BILITOT 0.8  ALBUMIN 3.5    Cardiac Enzymes Recent Labs  Lab 04/07/18 2322  TROPONINI <0.03    Glucose Recent Labs  Lab 04/08/18 1709  GLUCAP 122*    Imaging Ct Angio Chest Pe W And/or Wo Contrast  Result Date: 04/08/2018 CLINICAL DATA:  Gastrointestinal upset with pain, nausea and reflux. Back surgery at [facility] on Thursday. EXAM: CT ANGIOGRAPHY CHEST CT ABDOMEN AND PELVIS WITH CONTRAST TECHNIQUE: Multidetector CT imaging of the chest was performed using the standard protocol during bolus administration of intravenous contrast. Multiplanar CT image reconstructions and MIPs were obtained to evaluate the vascular anatomy. Multidetector CT imaging of the abdomen and pelvis was performed using the standard protocol during bolus administration of intravenous contrast. CONTRAST:  45mL OMNIPAQUE IOHEXOL 350 MG/ML SOLN COMPARISON:  04/05/2018 intraoperative views of the thoracic spine, thoracic spine radiographs 03/21/2018 FINDINGS: CTA CHEST FINDINGS Cardiovascular: Cardiomegaly without pericardial effusion. Nonaneurysmal ectatic thoracic aorta measuring up to 3.7 cm. No dissection. Satisfactory pulmonary arterial opacification to the segmental level without acute pulmonary embolus. Great vessels are within normal limits. Mediastinum/Nodes: Small mediastinal lymph nodes without pathologic enlargement. Patent trachea and mainstem bronchi. Slight dilatation of the mid and distal esophagus without focal abnormality. The thyroid gland is unremarkable. Lungs/Pleura: Scarring at the apices. Centrilobular emphysema scattered bilateral multilobar bronchiectasis and scattered areas of inspissated mucus within areas of bronchiectasis are noted in the right upper, right middle and both lower lobes. Eventration of the left hemidiaphragm is present. Musculoskeletal: Lower thoracic kyphoplasties from T9 caudad to at least  the L1 level on the thoracic spine series. Osteopenic appearance of the thoracic spine. Mild superior endplate compression of T8 and inferior endplate compression of T7. Review of the MIP images confirms the above findings. CT ABDOMEN and PELVIS FINDINGS Hepatobiliary: Cholecystectomy. Mild reservoir effect accounting for intrahepatic ductal dilatation. Dilatation of the common bile duct at the pancreatic head to 14 mm in diameter without stones. Pancreas: Unremarkable without ductal dilatation, inflammation or mass. Spleen: Normal in size without focal abnormality. Adrenals/Urinary Tract: Normal bilateral adrenal glands. Simple cyst arising off the upper pole the left kidney measuring up to 3.2 cm. No nephrolithiasis nor hydroureteronephrosis. Urinary bladder is distended without focal mural thickening, intraluminal mass or calculus. Stomach/Bowel: There is a small moderate-sized hiatal hernia. The stomach is decompressed in appearance. There is normal small bowel  rotation. No small bowel obstruction or inflammation. Moderate stool retention is noted within the colon. Sigmoid diverticulosis without acute diverticulitis is identified. The appendix is not confidently identified but no pericecal inflammation is seen. Vascular/Lymphatic: Moderate aortoiliac atherosclerosis. No aneurysm. No dissection. No lymphadenopathy. Reproductive: Calcified uterine fibroid.  No adnexal mass. Other: No free air or free fluid.  No hernia. Musculoskeletal: T9 through L3 kyphoplasty without complicating features. Chronic superior endplate compressions of L4 and L5 without kyphoplasty cement. Review of the MIP images confirms the above findings. IMPRESSION: Chest CT: 1. Cardiomegaly with ectatic thoracic aorta. No acute pulmonary embolus. 2. Emphysema of the lungs with bronchiectasis. CT AP: 1. Chronic compression deformities of the thoracic and lumbar spine as above with kyphoplasty changes from T9 through L3. 2. Status post  cholecystectomy with reservoir effect accounting for intrahepatic and extrahepatic ductal dilatation. No choledocholithiasis. 3. Sigmoid diverticulosis without acute diverticulitis. 4. Moderate sized hiatal hernia. Eventration of the left hemidiaphragm. No bowel obstruction or inflammation. 5. Simple cyst arising off the upper the left kidney measuring up to 3.2 cm. 6. Calcified uterine fibroid. Electronically Signed   By: Ashley Royalty M.D.   On: 04/08/2018 03:14   Ct Abdomen Pelvis W Contrast  Result Date: 04/08/2018 CLINICAL DATA:  Gastrointestinal upset with pain, nausea and reflux. Back surgery at T10 on Thursday. EXAM: CT ANGIOGRAPHY CHEST CT ABDOMEN AND PELVIS WITH CONTRAST TECHNIQUE: Multidetector CT imaging of the chest was performed using the standard protocol during bolus administration of intravenous contrast. Multiplanar CT image reconstructions and MIPs were obtained to evaluate the vascular anatomy. Multidetector CT imaging of the abdomen and pelvis was performed using the standard protocol during bolus administration of intravenous contrast. CONTRAST:  45mL OMNIPAQUE IOHEXOL 350 MG/ML SOLN COMPARISON:  04/05/2018 intraoperative views of the thoracic spine, thoracic spine radiographs 03/21/2018 FINDINGS: CTA CHEST FINDINGS Cardiovascular: Cardiomegaly without pericardial effusion. Nonaneurysmal ectatic thoracic aorta measuring up to 3.7 cm. No dissection. Satisfactory pulmonary arterial opacification to the segmental level without acute pulmonary embolus. Great vessels are within normal limits. Mediastinum/Nodes: Small mediastinal lymph nodes without pathologic enlargement. Patent trachea and mainstem bronchi. Slight dilatation of the mid and distal esophagus without focal abnormality. The thyroid gland is unremarkable. Lungs/Pleura: Scarring at the apices. Centrilobular emphysema scattered bilateral multilobar bronchiectasis and scattered areas of inspissated mucus within areas of bronchiectasis are  noted in the right upper, right middle and both lower lobes. Eventration of the left hemidiaphragm is present. Musculoskeletal: Lower thoracic kyphoplasties from T9 caudad to at least the L1 level on the thoracic spine series. Osteopenic appearance of the thoracic spine. Mild superior endplate compression of T8 and inferior endplate compression of T7. Review of the MIP images confirms the above findings. CT ABDOMEN and PELVIS FINDINGS Hepatobiliary: Cholecystectomy. Mild reservoir effect accounting for intrahepatic ductal dilatation. Dilatation of the common bile duct at the pancreatic head to 14 mm in diameter without stones. Pancreas: Unremarkable without ductal dilatation, inflammation or mass. Spleen: Normal in size without focal abnormality. Adrenals/Urinary Tract: Normal bilateral adrenal glands. Simple cyst arising off the upper pole the left kidney measuring up to 3.2 cm. No nephrolithiasis nor hydroureteronephrosis. Urinary bladder is distended without focal mural thickening, intraluminal mass or calculus. Stomach/Bowel: There is a small moderate-sized hiatal hernia. The stomach is decompressed in appearance. There is normal small bowel rotation. No small bowel obstruction or inflammation. Moderate stool retention is noted within the colon. Sigmoid diverticulosis without acute diverticulitis is identified. The appendix is not confidently identified but no pericecal inflammation  is seen. Vascular/Lymphatic: Moderate aortoiliac atherosclerosis. No aneurysm. No dissection. No lymphadenopathy. Reproductive: Calcified uterine fibroid.  No adnexal mass. Other: No free air or free fluid.  No hernia. Musculoskeletal: T9 through L3 kyphoplasty without complicating features. Chronic superior endplate compressions of L4 and L5 without kyphoplasty cement. Review of the MIP images confirms the above findings. IMPRESSION: Chest CT: 1. Cardiomegaly with ectatic thoracic aorta. No acute pulmonary embolus. 2. Emphysema of  the lungs with bronchiectasis. CT AP: 1. Chronic compression deformities of the thoracic and lumbar spine as above with kyphoplasty changes from T9 through L3. 2. Status post cholecystectomy with reservoir effect accounting for intrahepatic and extrahepatic ductal dilatation. No choledocholithiasis. 3. Sigmoid diverticulosis without acute diverticulitis. 4. Moderate sized hiatal hernia. Eventration of the left hemidiaphragm. No bowel obstruction or inflammation. 5. Simple cyst arising off the upper the left kidney measuring up to 3.2 cm. 6. Calcified uterine fibroid. Electronically Signed   By: Ashley Royalty M.D.   On: 04/08/2018 03:14   Ct T-spine No Charge  Result Date: 04/08/2018 CLINICAL DATA:  Chest pain. Status post multiple kyphoplasties, most recent April 05, 2018. EXAM: CT THORACIC SPINE WITHOUT CONTRAST TECHNIQUE: Reformatted CT images of the thoracic were obtained using the standard protocol without intravenous contrast. COMPARISON:  Thoracolumbar spine radiographs March 21 2018 FINDINGS: ALIGNMENT: Maintained thoracic kyphosis. No malalignment. VERTEBRAE: Status post T9 through L1 vertebral body cement augmentation, new at T10. Mild T1, T5, T7 and moderate T8 compression fractures, new at T7. Limited comparison of small upper thoracic compression fractures on prior radiographs. Osteopenia. No destructive bony lesions. PARASPINAL AND OTHER SOFT TISSUES: Please see CT of chest from same day, reported separately. DISC LEVELS: No canal stenosis. Severe LEFT T9-10, moderate T10-11 and T11-12 neural foraminal narrowing. IMPRESSION: 1. Status post T9 through L1 vertebral bodies cement augmentation. 2. Mild T1, T5, T7 and moderate T8 compression fractures, new at T7. 3. Osteopenia. Electronically Signed   By: Elon Alas M.D.   On: 04/08/2018 03:24    ASSESSMENT  A. fib with RVR Intractable nausea and vomiting Hypokalemia Thoracic compression fracture status post kyphoplasty on 04/05/2018 History  of bronchiectasis History of Takotsubo syndrome   PLAN Diltiazem infusion and titrate to maintain heart rate less than 100 bpm Cardiology consult Monitor and correct electrolytes Continue anticoagulation Resume all home medications as tolerated Best Practice: Code Status: DNR Diet: Clear liquid diet and advance to heart healthy diet as tolerated GI prophylaxis: Not indicated VTE prophylaxis: SCDs/Eliquis  FAMILY  - Updates: No family at bedside.  Will update when available  Zaccheus Edmister S. [facility] ANP-BC Pulmonary and Critical Care Medicine [facility] Pager [phone] or [phone]  NB: This document was prepared using Dragon voice recognition software and may include unintentional dictation errors.    04/08/2018, 9:44 PM

## 2018-04-08 NOTE — Progress Notes (Signed)
This nurse was notified by family that pt was feeling like she was in a-fib. VSS upon assessment and HR in low 100s. Pt stated she was on 30mg  of Cardizem at home. MD [facility] paged. Verbal order for 30 mg Cardizem tablet once given and cardiac telemetry orders. Medication given and placed pt on tele. Was notified a few minutes later from tele that HR was in the 180-190s. MD Sainani notified again. One time dose of IV 10mg  Cardizem ordered. Rapid response called on pt. Rapid response nurse gave IV Cardizem push. EKG was obtained. HR slowed to the 120s but started increasing slowly.  MD Fieldon notified. Pt transferred to CCU.

## 2018-04-08 NOTE — Care Management Obs Status (Signed)
[facility] NOTIFICATION   Patient Details  Name: Ana Washington MRN: [id number] Date of Birth: [birth year]   Medicare Observation Status Notification Given:  Yes    [name], RN 04/08/2018, 8:38 AM

## 2018-04-08 NOTE — Significant Event (Signed)
Rapid Response Event Note  Overview: Time Called: 1629 Arrival Time: 1633 Event Type: Cardiac  Initial Focused Assessment: called for RR on patient in afib/rvr. Pt laying in bed, aware of heart racing. See flowsheets for VS.   Interventions: Spoke with Dr Verdell Carmine, stat ekg, cardizem iv push... transfer to stepdown for cardizem drip, no 2A beds available at this time.  Plan of Care (if not transferred):  Event Summary: Name of Physician Notified: Dr Verdell Carmine at [place]  Name of Consulting Physician Notified: Patsey Berthold at [place]  Outcome: Transferred (Comment)  Event End Time: [place]

## 2018-04-08 NOTE — Progress Notes (Signed)
[facility] [place] NAME: Ana Washington    MR#:  [id number]  DATE OF BIRTH:  07-Oct-1945  SUBJECTIVE:   Patient presented to the hospital due to intractable nausea vomiting and some epigastric abdominal pain.  Patient is status post recent kyphoplasty for compression fracture.  This morning patient was more complaining of some shortness of breath.  And also complaining of some heartburn.  REVIEW OF SYSTEMS:    Review of Systems  Constitutional: Negative for chills and fever.  HENT: Negative for congestion and tinnitus.   Eyes: Negative for blurred vision and double vision.  Respiratory: Negative for cough, shortness of breath and wheezing.   Cardiovascular: Negative for chest pain, orthopnea and PND.  Gastrointestinal: Positive for abdominal pain, nausea and vomiting. Negative for diarrhea.  Genitourinary: Negative for dysuria and hematuria.  Neurological: Negative for dizziness, sensory change and focal weakness.  All other systems reviewed and are negative.   Nutrition: Heart Healthy Tolerating Diet: Yes Tolerating PT: Ambulatory  DRUG ALLERGIES:   Allergies  Allergen Reactions  . Sulfa Antibiotics Diarrhea  . Codeine Nausea And Vomiting  . Penicillins Rash    Has patient had a PCN reaction causing immediate rash, facial/tongue/throat swelling, SOB or lightheadedness with hypotension: Unknown Has patient had a PCN reaction causing severe rash involving mucus membranes or skin necrosis: Unknown Has patient had a PCN reaction that required hospitalization: Unknown Has patient had a PCN reaction occurring within the last 10 years: No If all of the above answers are "NO", then may proceed with Cephalosporin use.     VITALS:  Blood pressure (!) 169/96, pulse (!) 101, temperature 97.9 F (36.6 C), temperature source Oral, resp. rate 20, height 5\' 5"  (1.651 m), weight 49 kg, SpO2 98 %.  PHYSICAL EXAMINATION:   Physical  Exam  GENERAL:  73 y.o.-year-old thin patient lying in bed in mild distress.  EYES: Pupils equal, round, reactive to light and accommodation. No scleral icterus. Extraocular muscles intact.  HEENT: Head atraumatic, normocephalic. Oropharynx and nasopharynx clear.  NECK:  Supple, no jugular venous distention. No thyroid enlargement, no tenderness.  LUNGS: Normal breath sounds bilaterally, no wheezing, rales, rhonchi. + use of accessory muscles of respiration.  CARDIOVASCULAR: S1, S2 normal. No murmurs, rubs, or gallops.  ABDOMEN: Soft, nontender, nondistended. Bowel sounds present. No organomegaly or mass.  EXTREMITIES: No cyanosis, clubbing or edema b/l.    NEUROLOGIC: Cranial nerves II through XII are intact. No focal Motor or sensory deficits b/l.   PSYCHIATRIC: The patient is alert and oriented x 3.  SKIN: No obvious rash, lesion, or ulcer.    LABORATORY PANEL:   CBC Recent Labs  Lab 04/07/18 2322  WBC 15.6*  HGB 11.8*  HCT 35.4*  PLT 370   ------------------------------------------------------------------------------------------------------------------  Chemistries  Recent Labs  Lab 04/07/18 2322  NA 135  K 3.0*  CL 90*  CO2 36*  GLUCOSE 111*  BUN 7*  CREATININE 0.54  CALCIUM 8.8*  MG 2.0  AST 24  ALT 13  ALKPHOS 74  BILITOT 0.8   ------------------------------------------------------------------------------------------------------------------  Cardiac Enzymes Recent Labs  Lab 04/07/18 2322  TROPONINI <0.03   ------------------------------------------------------------------------------------------------------------------  RADIOLOGY:  Ct Angio Chest Pe W And/or Wo Contrast  Result Date: 04/08/2018 CLINICAL DATA:  Gastrointestinal upset with pain, nausea and reflux. Back surgery at [facility] on Thursday. EXAM: CT ANGIOGRAPHY CHEST CT ABDOMEN AND PELVIS WITH CONTRAST TECHNIQUE: Multidetector CT imaging of the chest was performed using the  standard protocol during  bolus administration of intravenous contrast. Multiplanar CT image reconstructions and MIPs were obtained to evaluate the vascular anatomy. Multidetector CT imaging of the abdomen and pelvis was performed using the standard protocol during bolus administration of intravenous contrast. CONTRAST:  26mL OMNIPAQUE IOHEXOL 350 MG/ML SOLN COMPARISON:  04/05/2018 intraoperative views of the thoracic spine, thoracic spine radiographs 03/21/2018 FINDINGS: CTA CHEST FINDINGS Cardiovascular: Cardiomegaly without pericardial effusion. Nonaneurysmal ectatic thoracic aorta measuring up to 3.7 cm. No dissection. Satisfactory pulmonary arterial opacification to the segmental level without acute pulmonary embolus. Great vessels are within normal limits. Mediastinum/Nodes: Small mediastinal lymph nodes without pathologic enlargement. Patent trachea and mainstem bronchi. Slight dilatation of the mid and distal esophagus without focal abnormality. The thyroid gland is unremarkable. Lungs/Pleura: Scarring at the apices. Centrilobular emphysema scattered bilateral multilobar bronchiectasis and scattered areas of inspissated mucus within areas of bronchiectasis are noted in the right upper, right middle and both lower lobes. Eventration of the left hemidiaphragm is present. Musculoskeletal: Lower thoracic kyphoplasties from T9 caudad to at least the L1 level on the thoracic spine series. Osteopenic appearance of the thoracic spine. Mild superior endplate compression of T8 and inferior endplate compression of T7. Review of the MIP images confirms the above findings. CT ABDOMEN and PELVIS FINDINGS Hepatobiliary: Cholecystectomy. Mild reservoir effect accounting for intrahepatic ductal dilatation. Dilatation of the common bile duct at the pancreatic head to 14 mm in diameter without stones. Pancreas: Unremarkable without ductal dilatation, inflammation or mass. Spleen: Normal in size without focal abnormality. Adrenals/Urinary Tract: Normal  bilateral adrenal glands. Simple cyst arising off the upper pole the left kidney measuring up to 3.2 cm. No nephrolithiasis nor hydroureteronephrosis. Urinary bladder is distended without focal mural thickening, intraluminal mass or calculus. Stomach/Bowel: There is a small moderate-sized hiatal hernia. The stomach is decompressed in appearance. There is normal small bowel rotation. No small bowel obstruction or inflammation. Moderate stool retention is noted within the colon. Sigmoid diverticulosis without acute diverticulitis is identified. The appendix is not confidently identified but no pericecal inflammation is seen. Vascular/Lymphatic: Moderate aortoiliac atherosclerosis. No aneurysm. No dissection. No lymphadenopathy. Reproductive: Calcified uterine fibroid.  No adnexal mass. Other: No free air or free fluid.  No hernia. Musculoskeletal: T9 through L3 kyphoplasty without complicating features. Chronic superior endplate compressions of L4 and L5 without kyphoplasty cement. Review of the MIP images confirms the above findings. IMPRESSION: Chest CT: 1. Cardiomegaly with ectatic thoracic aorta. No acute pulmonary embolus. 2. Emphysema of the lungs with bronchiectasis. CT AP: 1. Chronic compression deformities of the thoracic and lumbar spine as above with kyphoplasty changes from T9 through L3. 2. Status post cholecystectomy with reservoir effect accounting for intrahepatic and extrahepatic ductal dilatation. No choledocholithiasis. 3. Sigmoid diverticulosis without acute diverticulitis. 4. Moderate sized hiatal hernia. Eventration of the left hemidiaphragm. No bowel obstruction or inflammation. 5. Simple cyst arising off the upper the left kidney measuring up to 3.2 cm. 6. Calcified uterine fibroid. Electronically Signed   By: Ashley Royalty M.D.   On: 04/08/2018 03:14   Ct Abdomen Pelvis W Contrast  Result Date: 04/08/2018 CLINICAL DATA:  Gastrointestinal upset with pain, nausea and reflux. Back surgery at [facility]  on Thursday. EXAM: CT ANGIOGRAPHY CHEST CT ABDOMEN AND PELVIS WITH CONTRAST TECHNIQUE: Multidetector CT imaging of the chest was performed using the standard protocol during bolus administration of intravenous contrast. Multiplanar CT image reconstructions and MIPs were obtained to evaluate the vascular anatomy. Multidetector CT imaging of the abdomen and pelvis was performed  using the standard protocol during bolus administration of intravenous contrast. CONTRAST:  47mL OMNIPAQUE IOHEXOL 350 MG/ML SOLN COMPARISON:  04/05/2018 intraoperative views of the thoracic spine, thoracic spine radiographs 03/21/2018 FINDINGS: CTA CHEST FINDINGS Cardiovascular: Cardiomegaly without pericardial effusion. Nonaneurysmal ectatic thoracic aorta measuring up to 3.7 cm. No dissection. Satisfactory pulmonary arterial opacification to the segmental level without acute pulmonary embolus. Great vessels are within normal limits. Mediastinum/Nodes: Small mediastinal lymph nodes without pathologic enlargement. Patent trachea and mainstem bronchi. Slight dilatation of the mid and distal esophagus without focal abnormality. The thyroid gland is unremarkable. Lungs/Pleura: Scarring at the apices. Centrilobular emphysema scattered bilateral multilobar bronchiectasis and scattered areas of inspissated mucus within areas of bronchiectasis are noted in the right upper, right middle and both lower lobes. Eventration of the left hemidiaphragm is present. Musculoskeletal: Lower thoracic kyphoplasties from T9 caudad to at least the L1 level on the thoracic spine series. Osteopenic appearance of the thoracic spine. Mild superior endplate compression of T8 and inferior endplate compression of T7. Review of the MIP images confirms the above findings. CT ABDOMEN and PELVIS FINDINGS Hepatobiliary: Cholecystectomy. Mild reservoir effect accounting for intrahepatic ductal dilatation. Dilatation of the common bile duct at the pancreatic head to 14 mm in  diameter without stones. Pancreas: Unremarkable without ductal dilatation, inflammation or mass. Spleen: Normal in size without focal abnormality. Adrenals/Urinary Tract: Normal bilateral adrenal glands. Simple cyst arising off the upper pole the left kidney measuring up to 3.2 cm. No nephrolithiasis nor hydroureteronephrosis. Urinary bladder is distended without focal mural thickening, intraluminal mass or calculus. Stomach/Bowel: There is a small moderate-sized hiatal hernia. The stomach is decompressed in appearance. There is normal small bowel rotation. No small bowel obstruction or inflammation. Moderate stool retention is noted within the colon. Sigmoid diverticulosis without acute diverticulitis is identified. The appendix is not confidently identified but no pericecal inflammation is seen. Vascular/Lymphatic: Moderate aortoiliac atherosclerosis. No aneurysm. No dissection. No lymphadenopathy. Reproductive: Calcified uterine fibroid.  No adnexal mass. Other: No free air or free fluid.  No hernia. Musculoskeletal: T9 through L3 kyphoplasty without complicating features. Chronic superior endplate compressions of L4 and L5 without kyphoplasty cement. Review of the MIP images confirms the above findings. IMPRESSION: Chest CT: 1. Cardiomegaly with ectatic thoracic aorta. No acute pulmonary embolus. 2. Emphysema of the lungs with bronchiectasis. CT AP: 1. Chronic compression deformities of the thoracic and lumbar spine as above with kyphoplasty changes from T9 through L3. 2. Status post cholecystectomy with reservoir effect accounting for intrahepatic and extrahepatic ductal dilatation. No choledocholithiasis. 3. Sigmoid diverticulosis without acute diverticulitis. 4. Moderate sized hiatal hernia. Eventration of the left hemidiaphragm. No bowel obstruction or inflammation. 5. Simple cyst arising off the upper the left kidney measuring up to 3.2 cm. 6. Calcified uterine fibroid. Electronically Signed   By: Ashley Royalty M.D.   On: 04/08/2018 03:14   Ct T-spine No Charge  Result Date: 04/08/2018 CLINICAL DATA:  Chest pain. Status post multiple kyphoplasties, most recent April 05, 2018. EXAM: CT THORACIC SPINE WITHOUT CONTRAST TECHNIQUE: Reformatted CT images of the thoracic were obtained using the standard protocol without intravenous contrast. COMPARISON:  Thoracolumbar spine radiographs March 21 2018 FINDINGS: ALIGNMENT: Maintained thoracic kyphosis. No malalignment. VERTEBRAE: Status post T9 through L1 vertebral body cement augmentation, new at T10. Mild T1, T5, T7 and moderate T8 compression fractures, new at T7. Limited comparison of small upper thoracic compression fractures on prior radiographs. Osteopenia. No destructive bony lesions. PARASPINAL AND OTHER SOFT TISSUES: Please see CT of  chest from same day, reported separately. DISC LEVELS: No canal stenosis. Severe LEFT T9-10, moderate T10-11 and T11-12 neural foraminal narrowing. IMPRESSION: 1. Status post T9 through L1 vertebral bodies cement augmentation. 2. Mild T1, T5, T7 and moderate T8 compression fractures, new at T7. 3. Osteopenia. Electronically Signed   By: Elon Alas M.D.   On: 04/08/2018 03:24     ASSESSMENT AND PLAN:   73 year old female with past medical history of chronic respiratory failure secondary to bronchiectasis, back pain secondary to compression fracture status post recent kyphoplasty, history of peptic ulcer disease, hypertension, GERD, coronary disease, atrial fibrillation who presents to the hospital due to intractable nausea vomiting and abdominal pain.  1.  Intractable nausea vomiting abdominal pain-etiology unclear, patient CT abdomen pelvis was negative for acute pathology.  Patient LFTs are stable lipase is normal.  Suspect this is likely gastritis/GERD.  Switch from oral Protonix to IV Protonix, will also add some Maalox as needed. -Continue supportive care for now.    2.  Shortness of breath- patient has chronic  shortness of breath due to her bronchiectasis.  She is not significantly hypoxic.  Continue supportive care O2 supplementation for now.  Treat underlying anxiety.  3.  Hypokalemia-secondary to the nausea vomiting. -We will continue supplement and repeat level in the morning.  Check magnesium level.  4.  History of atrial fibrillation-rate controlled.  Continue Toprol, continue Eliquis.  5.  Essential hypertension-continue losartan, Toprol, Norvasc.  6.  GERD- continue Protonix, Carafate    All the records are reviewed and case discussed with Care Management/Social Worker. Management plans discussed with the patient, family and they are in agreement.  CODE STATUS: Full code  DVT Prophylaxis: Eliquis  TOTAL TIME TAKING CARE OF THIS PATIENT: 30 minutes.   POSSIBLE D/C IN 1-2 DAYS, DEPENDING ON CLINICAL CONDITION.   Henreitta Leber M.D on 04/08/2018 at 1:50 PM  Between 7am to 6pm - Pager - [phone]  After 6pm go to [email or web address] - Proofreader  [facility]  Office  [phone]  CC: Primary care physician; [name], MD

## 2018-04-08 NOTE — ED Notes (Signed)
ED Provider at bedside discussing treatment plan

## 2018-04-08 NOTE — H&P (Signed)
Ana Washington is an 73 y.o. female.   Chief Complaint: Nausea and vomiting HPI: The patient with past medical history of atrial fibrillation, coronary artery disease, hypertension, bronchiectasis and recent thoracic compression fracture status post kyphoplasty on April 05, 2018 presents to the emergency department with nausea and vomiting.  The patient has been unable to keep anything down since her procedure.  Laboratory evaluation showed hypokalemia as well as hypochloremia.  The patient received multiple doses of antiemetic in the emergency department as well as potassium replacement and fluid resuscitation but is still unable to take p.o. which prompted the emergency department staff to call hospitalist service for admission.  Past Medical History:  Diagnosis Date  . Arthritis   . Atrial fibrillation ([place])   . Bronchiectasis ([place])   . CAD (coronary artery disease)   . CAD (coronary artery disease) 07/30/2017  . Dumping syndrome   . Dyspnea   . Dysrhythmia    afib  . 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 Family history of adverse reaction to anesthesia    sister [name]  . GERD (gastroesophageal reflux disease)   . Headache    MIGRAINES  . Hypertension   . Lung disease   . Myocardial infarction ([place]) 2007   Non-STEMI  . [name] (postoperative nausea and vomiting)   . Psoriasis   . PUD (peptic ulcer disease)     Past Surgical History:  Procedure Laterality Date  . BACK SURGERY    . CHOLECYSTECTOMY    . EYE SURGERY    . FOOT SURGERY    . KYPHOPLASTY N/A 07/05/2016   Procedure: KYPHOPLASTY T - 9;  Surgeon: Hessie Knows, MD;  Location: [facility] ORS;  Service: Orthopedics;  Laterality: N/A;  . KYPHOPLASTY N/A 11/29/2017   Procedure: Iona Hansen;  Surgeon: Hessie Knows, MD;  Location: [facility] ORS;  Service: Orthopedics;  Laterality: N/A;  L2 and L3  . KYPHOPLASTY N/A 12/18/2017   Procedure: KYPHOPLASTY L1;  Surgeon: Hessie Knows, MD;  Location: [facility] ORS;  Service: Orthopedics;   Laterality: N/A;  . KYPHOPLASTY N/A 01/05/2018   Procedure: KYPHOPLASTY-T11,T12;  Surgeon: Hessie Knows, MD;  Location: [facility] ORS;  Service: Orthopedics;  Laterality: N/A;  . KYPHOPLASTY N/A 04/05/2018   Procedure: T10 KYPHOPLASTY;  Surgeon: Hessie Knows, MD;  Location: [facility] ORS;  Service: Orthopedics;  Laterality: N/A;  . LUNG SURGERY  1990 and 1996  . THOROCOTOMY WITH LOBECTOMY     LEFT LOWER THORACOTOMY / RIGHT MIDDLE LOBECTOMY    Family History  Problem Relation Age of Onset  . Hypertension Mother   . Hypertension Father    Social History:  reports that she has never smoked. She has never used smokeless tobacco. She reports that she does not drink alcohol or use drugs.  Allergies:  Allergies  Allergen Reactions  . Sulfa Antibiotics Diarrhea  . Codeine Nausea And Vomiting  . Penicillins Rash    Has patient had a PCN reaction causing immediate rash, facial/tongue/throat swelling, SOB or lightheadedness with hypotension: Unknown Has patient had a PCN reaction causing severe rash involving mucus membranes or skin necrosis: Unknown Has patient had a PCN reaction that required hospitalization: Unknown Has patient had a PCN reaction occurring within the last 10 years: No If all of the above answers are "NO", then may proceed with Cephalosporin use.     Prior to Admission medications   Medication Sig Start Date End Date Taking? Authorizing Provider  acetaminophen (TYLENOL) 500 MG tablet Take 1,000 mg by mouth every 6 (six) hours  as needed for mild pain or headache.     [provider]  amLODipine (NORVASC) 5 MG tablet Take 5 mg by mouth daily.  11/04/13   [provider]  apixaban (ELIQUIS) 5 MG TABS tablet Take 5 mg by mouth 2 (two) times daily.     [provider]  Ascorbic Acid (VITAMIN C) 1000 MG tablet Take 1,000 mg by mouth daily.    [provider]  aspirin EC 81 MG tablet Take 81 mg by mouth daily.    [provider]  azithromycin  (ZITHROMAX) 250 MG tablet Take 250 mg by mouth daily. continuou 09/09/11   [provider]  Calcium Carbonate-Vitamin D (OYSTER SHELL/VITAMIN D) 600-125 MG-UNIT TABS Take 1 tablet by mouth daily. 11/05/07   [provider]  chlorpheniramine-HYDROcodone (TUSSIONEX) 10-8 MG/5ML SUER Take 6 mLs by mouth at bedtime as needed for cough.     [provider]  Cholecalciferol (VITAMIN D-1000 MAX ST) 1000 UNITS tablet Take 1,000 Units by mouth daily.     [provider]  ferrous sulfate 325 (65 FE) MG EC tablet Take 325 mg by mouth daily.     [provider]  gabapentin (NEURONTIN) 300 MG capsule Take 300 mg by mouth 3 (three) times daily.     [provider]  levofloxacin (LEVAQUIN) 750 MG tablet Take 750 mg by mouth daily.    [provider]  LORazepam (ATIVAN) 0.5 MG tablet Take 0.5 mg by mouth every 4 (four) hours as needed (breathing issues).     [provider]  losartan (COZAAR) 100 MG tablet Take 100 mg by mouth daily.  07/10/13   [provider]  metoprolol succinate (TOPROL-XL) 50 MG 24 hr tablet Take 50 mg by mouth at bedtime.  10/17/13   [provider]  Multiple Vitamin (MULTIVITAMIN WITH MINERALS) TABS tablet Take 1 tablet by mouth daily. Women's 50+    [provider]  omeprazole (PRILOSEC OTC) 20 MG tablet Take 40 mg by mouth daily.     [provider]  potassium chloride (K-DUR) 10 MEQ tablet Take 10 mEq by mouth daily.    [provider]  predniSONE (DELTASONE) 10 MG tablet TAKE 1 TABLET(10 MG) BY MOUTH DAILY WITH BREAKFAST Patient taking differently: Take 10 mg by mouth daily with breakfast.  02/28/18   Laverle Hobby, MD  Probiotic Product (ALIGN) 4 MG CAPS Take 4 mg by mouth daily.    [provider]  sodium chloride 0.9 % nebulizer solution Take 3 mLs by nebulization 3 (three) times daily.  06/07/16   [provider]  sucralfate (CARAFATE) 1 g tablet Take  1 g by mouth 4 (four) times daily.    [provider]  traMADol (ULTRAM) 50 MG tablet Take 1 tablet (50 mg total) by mouth every 6 (six) hours as needed. 01/06/18   Hessie Knows, MD     Results for orders placed or performed during the hospital encounter of 04/07/18 (from the past 48 hour(s))  Lipase, blood     Status: None   Collection Time: 04/07/18 11:22 PM  Result Value Ref Range   Lipase 29 11 - 51 U/L    Comment: Performed at [facility], [address] [place], [place] [address]  Comprehensive metabolic panel     Status: Abnormal   Collection Time: 04/07/18 11:22 PM  Result Value Ref Range   Sodium 135 135 - 145 mmol/L   Potassium 3.0 (L) 3.5 - 5.1  mmol/L   Chloride 90 (L) 98 - 111 mmol/L   CO2 36 (H) 22 - 32 mmol/L   Glucose, Bld 111 (H) 70 - 99 mg/dL   BUN 7 (L) 8 - 23 mg/dL   Creatinine, Ser 0.54 0.44 - 1.00 mg/dL   Calcium 8.8 (L) 8.9 - 10.3 mg/dL   Total Protein 6.2 (L) 6.5 - 8.1 g/dL   Albumin 3.5 3.5 - 5.0 g/dL   AST 24 15 - 41 U/L   ALT 13 0 - 44 U/L   Alkaline Phosphatase 74 38 - 126 U/L   Total Bilirubin 0.8 0.3 - 1.2 mg/dL   GFR calc non Af Amer >60 >60 mL/min   GFR calc Af Amer >60 >60 mL/min   Anion gap 9 5 - 15    Comment: Performed at [facility], [address] [place], [place] [address]  CBC     Status: Abnormal   Collection Time: 04/07/18 11:22 PM  Result Value Ref Range   WBC 15.6 (H) 4.0 - 10.5 K/uL   RBC 3.50 (L) 3.87 - 5.11 MIL/uL   Hemoglobin 11.8 (L) 12.0 - 15.0 g/dL   HCT 35.4 (L) 36.0 - 46.0 %   MCV 101.1 (H) 80.0 - 100.0 fL   MCH 33.7 26.0 - 34.0 pg   MCHC 33.3 30.0 - 36.0 g/dL   RDW 11.7 11.5 - 15.5 %   Platelets 370 150 - 400 K/uL   nRBC 0.0 0.0 - 0.2 %    Comment: Performed at [facility], [address] [place], [place] [address]  Urinalysis, Complete w Microscopic     Status: Abnormal   Collection Time: 04/07/18 11:22 PM  Result Value Ref Range   Color, Urine STRAW (A) YELLOW    APPearance CLEAR (A) CLEAR   Specific Gravity, Urine 1.004 (L) 1.005 - 1.030   pH 8.0 5.0 - 8.0   Glucose, UA NEGATIVE NEGATIVE mg/dL   Hgb urine dipstick SMALL (A) NEGATIVE   Bilirubin Urine NEGATIVE NEGATIVE   Ketones, ur NEGATIVE NEGATIVE mg/dL   Protein, ur NEGATIVE NEGATIVE mg/dL   Nitrite NEGATIVE NEGATIVE   Leukocytes,Ua NEGATIVE NEGATIVE   RBC / HPF 0-5 0 - 5 RBC/hpf   WBC, UA 0-5 0 - 5 WBC/hpf   Bacteria, UA NONE SEEN NONE SEEN   Squamous Epithelial / LPF 0-5 0 - 5    Comment: Performed at [facility], [address] [place], [place] [address]  Troponin I - ONCE - STAT     Status: None   Collection Time: 04/07/18 11:22 PM  Result Value Ref Range   Troponin I <0.03 <0.03 ng/mL    Comment: Performed at [facility], [address] [place], [place] [address]  Magnesium     Status: None   Collection Time: 04/07/18 11:22 PM  Result Value Ref Range   Magnesium 2.0 1.7 - 2.4 mg/dL    Comment: Performed at [facility], [address] [place], [place] [address]   Ct Angio Chest Pe W And/or Wo Contrast  Result Date: 04/08/2018 CLINICAL DATA:  Gastrointestinal upset with pain, nausea and reflux. Back surgery at [facility] on Thursday. EXAM: CT ANGIOGRAPHY CHEST CT ABDOMEN AND PELVIS WITH CONTRAST TECHNIQUE: Multidetector CT imaging of the chest was performed using the standard protocol during bolus administration of intravenous contrast. Multiplanar CT image reconstructions and MIPs were obtained to evaluate the vascular anatomy. Multidetector CT imaging of the abdomen and pelvis was performed using the  standard protocol during bolus administration of intravenous contrast. CONTRAST:  1mL OMNIPAQUE IOHEXOL 350 MG/ML SOLN COMPARISON:  04/05/2018 intraoperative views of the thoracic spine, thoracic spine radiographs 03/21/2018 FINDINGS: CTA CHEST FINDINGS Cardiovascular: Cardiomegaly without pericardial effusion. Nonaneurysmal ectatic thoracic aorta measuring up to  3.7 cm. No dissection. Satisfactory pulmonary arterial opacification to the segmental level without acute pulmonary embolus. Great vessels are within normal limits. Mediastinum/Nodes: Small mediastinal lymph nodes without pathologic enlargement. Patent trachea and mainstem bronchi. Slight dilatation of the mid and distal esophagus without focal abnormality. The thyroid gland is unremarkable. Lungs/Pleura: Scarring at the apices. Centrilobular emphysema scattered bilateral multilobar bronchiectasis and scattered areas of inspissated mucus within areas of bronchiectasis are noted in the right upper, right middle and both lower lobes. Eventration of the left hemidiaphragm is present. Musculoskeletal: Lower thoracic kyphoplasties from T9 caudad to at least the L1 level on the thoracic spine series. Osteopenic appearance of the thoracic spine. Mild superior endplate compression of T8 and inferior endplate compression of T7. Review of the MIP images confirms the above findings. CT ABDOMEN and PELVIS FINDINGS Hepatobiliary: Cholecystectomy. Mild reservoir effect accounting for intrahepatic ductal dilatation. Dilatation of the common bile duct at the pancreatic head to 14 mm in diameter without stones. Pancreas: Unremarkable without ductal dilatation, inflammation or mass. Spleen: Normal in size without focal abnormality. Adrenals/Urinary Tract: Normal bilateral adrenal glands. Simple cyst arising off the upper pole the left kidney measuring up to 3.2 cm. No nephrolithiasis nor hydroureteronephrosis. Urinary bladder is distended without focal mural thickening, intraluminal mass or calculus. Stomach/Bowel: There is a small moderate-sized hiatal hernia. The stomach is decompressed in appearance. There is normal small bowel rotation. No small bowel obstruction or inflammation. Moderate stool retention is noted within the colon. Sigmoid diverticulosis without acute diverticulitis is identified. The appendix is not confidently  identified but no pericecal inflammation is seen. Vascular/Lymphatic: Moderate aortoiliac atherosclerosis. No aneurysm. No dissection. No lymphadenopathy. Reproductive: Calcified uterine fibroid.  No adnexal mass. Other: No free air or free fluid.  No hernia. Musculoskeletal: T9 through L3 kyphoplasty without complicating features. Chronic superior endplate compressions of L4 and L5 without kyphoplasty cement. Review of the MIP images confirms the above findings. IMPRESSION: Chest CT: 1. Cardiomegaly with ectatic thoracic aorta. No acute pulmonary embolus. 2. Emphysema of the lungs with bronchiectasis. CT AP: 1. Chronic compression deformities of the thoracic and lumbar spine as above with kyphoplasty changes from T9 through L3. 2. Status post cholecystectomy with reservoir effect accounting for intrahepatic and extrahepatic ductal dilatation. No choledocholithiasis. 3. Sigmoid diverticulosis without acute diverticulitis. 4. Moderate sized hiatal hernia. Eventration of the left hemidiaphragm. No bowel obstruction or inflammation. 5. Simple cyst arising off the upper the left kidney measuring up to 3.2 cm. 6. Calcified uterine fibroid. Electronically Signed   By: Ashley Royalty M.D.   On: 04/08/2018 03:14   Ct Abdomen Pelvis W Contrast  Result Date: 04/08/2018 CLINICAL DATA:  Gastrointestinal upset with pain, nausea and reflux. Back surgery at T10 on Thursday. EXAM: CT ANGIOGRAPHY CHEST CT ABDOMEN AND PELVIS WITH CONTRAST TECHNIQUE: Multidetector CT imaging of the chest was performed using the standard protocol during bolus administration of intravenous contrast. Multiplanar CT image reconstructions and MIPs were obtained to evaluate the vascular anatomy. Multidetector CT imaging of the abdomen and pelvis was performed using the standard protocol during bolus administration of intravenous contrast. CONTRAST:  40mL OMNIPAQUE IOHEXOL 350 MG/ML SOLN COMPARISON:  04/05/2018 intraoperative views of the thoracic spine,  thoracic spine radiographs 03/21/2018 FINDINGS: CTA  CHEST FINDINGS Cardiovascular: Cardiomegaly without pericardial effusion. Nonaneurysmal ectatic thoracic aorta measuring up to 3.7 cm. No dissection. Satisfactory pulmonary arterial opacification to the segmental level without acute pulmonary embolus. Great vessels are within normal limits. Mediastinum/Nodes: Small mediastinal lymph nodes without pathologic enlargement. Patent trachea and mainstem bronchi. Slight dilatation of the mid and distal esophagus without focal abnormality. The thyroid gland is unremarkable. Lungs/Pleura: Scarring at the apices. Centrilobular emphysema scattered bilateral multilobar bronchiectasis and scattered areas of inspissated mucus within areas of bronchiectasis are noted in the right upper, right middle and both lower lobes. Eventration of the left hemidiaphragm is present. Musculoskeletal: Lower thoracic kyphoplasties from T9 caudad to at least the L1 level on the thoracic spine series. Osteopenic appearance of the thoracic spine. Mild superior endplate compression of T8 and inferior endplate compression of T7. Review of the MIP images confirms the above findings. CT ABDOMEN and PELVIS FINDINGS Hepatobiliary: Cholecystectomy. Mild reservoir effect accounting for intrahepatic ductal dilatation. Dilatation of the common bile duct at the pancreatic head to 14 mm in diameter without stones. Pancreas: Unremarkable without ductal dilatation, inflammation or mass. Spleen: Normal in size without focal abnormality. Adrenals/Urinary Tract: Normal bilateral adrenal glands. Simple cyst arising off the upper pole the left kidney measuring up to 3.2 cm. No nephrolithiasis nor hydroureteronephrosis. Urinary bladder is distended without focal mural thickening, intraluminal mass or calculus. Stomach/Bowel: There is a small moderate-sized hiatal hernia. The stomach is decompressed in appearance. There is normal small bowel rotation. No small bowel  obstruction or inflammation. Moderate stool retention is noted within the colon. Sigmoid diverticulosis without acute diverticulitis is identified. The appendix is not confidently identified but no pericecal inflammation is seen. Vascular/Lymphatic: Moderate aortoiliac atherosclerosis. No aneurysm. No dissection. No lymphadenopathy. Reproductive: Calcified uterine fibroid.  No adnexal mass. Other: No free air or free fluid.  No hernia. Musculoskeletal: T9 through L3 kyphoplasty without complicating features. Chronic superior endplate compressions of L4 and L5 without kyphoplasty cement. Review of the MIP images confirms the above findings. IMPRESSION: Chest CT: 1. Cardiomegaly with ectatic thoracic aorta. No acute pulmonary embolus. 2. Emphysema of the lungs with bronchiectasis. CT AP: 1. Chronic compression deformities of the thoracic and lumbar spine as above with kyphoplasty changes from T9 through L3. 2. Status post cholecystectomy with reservoir effect accounting for intrahepatic and extrahepatic ductal dilatation. No choledocholithiasis. 3. Sigmoid diverticulosis without acute diverticulitis. 4. Moderate sized hiatal hernia. Eventration of the left hemidiaphragm. No bowel obstruction or inflammation. 5. Simple cyst arising off the upper the left kidney measuring up to 3.2 cm. 6. Calcified uterine fibroid. Electronically Signed   By: Ashley Royalty M.D.   On: 04/08/2018 03:14   Ct T-spine No Charge  Result Date: 04/08/2018 CLINICAL DATA:  Chest pain. Status post multiple kyphoplasties, most recent April 05, 2018. EXAM: CT THORACIC SPINE WITHOUT CONTRAST TECHNIQUE: Reformatted CT images of the thoracic were obtained using the standard protocol without intravenous contrast. COMPARISON:  Thoracolumbar spine radiographs March 21 2018 FINDINGS: ALIGNMENT: Maintained thoracic kyphosis. No malalignment. VERTEBRAE: Status post T9 through L1 vertebral body cement augmentation, new at T10. Mild T1, T5, T7 and moderate  T8 compression fractures, new at T7. Limited comparison of small upper thoracic compression fractures on prior radiographs. Osteopenia. No destructive bony lesions. PARASPINAL AND OTHER SOFT TISSUES: Please see CT of chest from same day, reported separately. DISC LEVELS: No canal stenosis. Severe LEFT T9-10, moderate T10-11 and T11-12 neural foraminal narrowing. IMPRESSION: 1. Status post T9 through L1 vertebral bodies cement augmentation. 2.  Mild T1, T5, T7 and moderate T8 compression fractures, new at T7. 3. Osteopenia. Electronically Signed   By: Elon Alas M.D.   On: 04/08/2018 03:24    Review of Systems  Constitutional: Negative for chills and fever.  HENT: Negative for sore throat and tinnitus.   Eyes: Negative for blurred vision and redness.  Respiratory: Negative for cough and shortness of breath.   Cardiovascular: Negative for chest pain, palpitations, orthopnea and PND.  Gastrointestinal: Positive for nausea and vomiting. Negative for abdominal pain and diarrhea.  Genitourinary: Negative for dysuria, frequency and urgency.  Musculoskeletal: Negative for joint pain and myalgias.  Skin: Negative for rash.       No lesions  Neurological: Negative for speech change, focal weakness and weakness.  Endo/Heme/Allergies: Does not bruise/bleed easily.       No temperature intolerance  Psychiatric/Behavioral: Negative for depression and suicidal ideas.    Blood pressure (!) 172/108, pulse 87, temperature (!) 97.5 F (36.4 C), resp. rate (!) 21, weight 49 kg, SpO2 100 %. Physical Exam  Vitals reviewed. Constitutional: She is oriented to person, place, and time. She appears well-developed and well-nourished. No distress.  HENT:  Head: Normocephalic and atraumatic.  Mouth/Throat: Oropharynx is clear and moist.  Eyes: Pupils are equal, round, and reactive to light. Conjunctivae and EOM are normal. No scleral icterus.  Neck: Normal range of motion. Neck supple. No JVD present. No  tracheal deviation present. No thyromegaly present.  Cardiovascular: Normal rate, regular rhythm and normal heart sounds. Exam reveals no gallop and no friction rub.  No murmur heard. Respiratory: Effort normal and breath sounds normal.  GI: Soft. Bowel sounds are normal. She exhibits no distension. There is no abdominal tenderness.  Genitourinary:    Genitourinary Comments: Deferred   Musculoskeletal: Normal range of motion.        General: No edema.  Lymphadenopathy:    She has no cervical adenopathy.  Neurological: She is alert and oriented to person, place, and time. No cranial nerve deficit. She exhibits normal muscle tone.  Skin: Skin is warm and dry. No rash noted. No erythema.  Psychiatric: She has a normal mood and affect. Her behavior is normal. Judgment and thought content normal.     Assessment/Plan This is a 73 year old female admitted for nausea and vomiting. 1.  Nausea and vomiting: Intractable; may be lingering effect of anesthesia.  CT abdomen does not demonstrate ileus.  Continue IV antiemetics.  Start with clear liquid diet and advance as tolerated. 2.  Hypokalemia: Secondary to above.  Replete potassium 3.  Bronchiectasis: "[place] syndrome".  Continue supplemental oxygen and prednisone.  Azithromycin daily. 4.  Hypertension: Uncontrolled; continue amlodipine, losartan and metoprolol.  Labetalol as needed. 5.  CAD: Stable; continue aspirin 6.  DVT prophylaxis: Eliquis 7.  GI prophylaxis: Pantoprazole per home regimen The patient is a full code.  Time spent on admission orders and patient care approximately 45 minutes  Harrie Foreman, MD 04/08/2018, 6:43 AM

## 2018-04-08 NOTE — ED Notes (Signed)
ED TO INPATIENT HANDOFF REPORT  ED Nurse Name and Phone #: Ena Dawley [phone]  S Name/Age/Gender Ana Washington 73 y.o. female Room/Bed: ED15A/ED15A  Code Status   Code Status: Prior  Home/SNF/Other Home Patient oriented to: self, place, time and situation Is this baseline? Yes   Triage Complete: Triage complete  Chief Complaint [facility] Poor Oral Intake  Triage Note Patient coming [facility] from for GI upset/pain, nausea, reflux, and reduced oral intake/urge to eat.   Patient had back surgery on T10 on Thursday. Patient reports symptoms began then.   Patient reports increased work of breathing/SOB.    Allergies Allergies  Allergen Reactions  . Sulfa Antibiotics Diarrhea  . Codeine Nausea And Vomiting  . Penicillins Rash    Has patient had a PCN reaction causing immediate rash, facial/tongue/throat swelling, SOB or lightheadedness with hypotension: Unknown Has patient had a PCN reaction causing severe rash involving mucus membranes or skin necrosis: Unknown Has patient had a PCN reaction that required hospitalization: Unknown Has patient had a PCN reaction occurring within the last 10 years: No If all of the above answers are "NO", then may proceed with Cephalosporin use.     Level of Care/Admitting Diagnosis ED Disposition    ED Disposition Condition [facility] Area: [place] [[id number]]  Level of Care: Med-Surg [16]  Diagnosis: Intractable nausea and vomiting [671245]  Admitting Physician: Harrie Foreman [[id number]]  Attending Physician: Harrie Foreman [id number]  PT Class (Do Not Modify): Observation [104]  PT Acc Code (Do Not Modify): Observation [10022]       B Medical/Surgery History Past Medical History:  Diagnosis Date  . Arthritis   . Atrial fibrillation ([place])   . Bronchiectasis ([place])   . CAD (coronary artery disease)   . CAD (coronary artery disease) 07/30/2017  . Dumping syndrome   . Dyspnea   . Dysrhythmia     afib  . 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 Family history of adverse reaction to anesthesia    sister [name]  . GERD (gastroesophageal reflux disease)   . Headache    MIGRAINES  . Hypertension   . Lung disease   . Myocardial infarction ([place]) 2007   Non-STEMI  . [name] (postoperative nausea and vomiting)   . Psoriasis   . PUD (peptic ulcer disease)    Past Surgical History:  Procedure Laterality Date  . BACK SURGERY    . CHOLECYSTECTOMY    . EYE SURGERY    . FOOT SURGERY    . KYPHOPLASTY N/A 07/05/2016   Procedure: KYPHOPLASTY T - 9;  Surgeon: Hessie Knows, MD;  Location: [facility];  Service: Orthopedics;  Laterality: N/A;  . KYPHOPLASTY N/A 11/29/2017   Procedure: Iona Hansen;  Surgeon: Hessie Knows, MD;  Location: [facility] OR[facility];  Service: Orthopedics;  Laterality: N/A;  L2 and L3  . KYPHOPLASTY N/A 12/18/2017   Procedure: KYPHOPLASTY L1;  Surgeon: Hessie Knows, MD;  Location: [facility] OR[facility];  Service: Orthopedics;  Laterality: N/A;  . KYPHOPLASTY N/A 01/05/2018   Procedure: KYPHOPLASTY-T11,T12;  Surgeon: Hessie Knows, MD;  Location: [facility] OR[facility];  Service: Orthopedics;  Laterality: N/A;  . KYPHOPLASTY N/A 04/05/2018   Procedure: T10 KYPHOPLASTY;  Surgeon: Hessie Knows, MD;  Location: [facility] OR[facility];  Service: Orthopedics;  Laterality: N/A;  . LUNG SURGERY  1990 and 1996  . THOROCOTOMY WITH LOBECTOMY     LEFT LOWER THORACOTOMY / RIGHT MIDDLE LOBECTOMY     A IV Location/Drains/Wounds Patient Lines/Drains/Airways Status  Active Line/Drains/Airways    Name:   Placement date:   Placement time:   Site:   Days:   Peripheral IV 04/07/18 Left Antecubital   04/07/18    2249    Antecubital   1   External Urinary Catheter   04/07/18    2329    -   1   Incision (Closed) 07/05/16 Back Other (Comment)   07/05/16    0806     642   Incision (Closed) 11/29/17 Back   11/29/17    1521     130   Incision (Closed) 12/18/17 Back Other (Comment)   12/18/17    1538     111   Incision (Closed) 01/05/18  Back Other (Comment)   01/05/18    1655     93   Incision (Closed) 04/05/18 Back Other (Comment)   04/05/18    1245     3          Intake/Output Last 24 hours  Intake/Output Summary (Last 24 hours) at 04/08/2018 7893 Last data filed at 04/08/2018 8101 Gross per 24 hour  Intake 1103.54 ml  Output 400 ml  Net 703.54 ml    Labs/Imaging Results for orders placed or performed during the hospital encounter of 04/07/18 (from the past 48 hour(s))  Lipase, blood     Status: None   Collection Time: 04/07/18 11:22 PM  Result Value Ref Range   Lipase 29 11 - 51 U/L    Comment: Performed at [facility], [address] [place], [place] [address]  Comprehensive metabolic panel     Status: Abnormal   Collection Time: 04/07/18 11:22 PM  Result Value Ref Range   Sodium 135 135 - 145 mmol/L   Potassium 3.0 (L) 3.5 - 5.1 mmol/L   Chloride 90 (L) 98 - 111 mmol/L   CO2 36 (H) 22 - 32 mmol/L   Glucose, Bld 111 (H) 70 - 99 mg/dL   BUN 7 (L) 8 - 23 mg/dL   Creatinine, Ser 0.54 0.44 - 1.00 mg/dL   Calcium 8.8 (L) 8.9 - 10.3 mg/dL   Total Protein 6.2 (L) 6.5 - 8.1 g/dL   Albumin 3.5 3.5 - 5.0 g/dL   AST 24 15 - 41 U/L   ALT 13 0 - 44 U/L   Alkaline Phosphatase 74 38 - 126 U/L   Total Bilirubin 0.8 0.3 - 1.2 mg/dL   GFR calc non Af Amer >60 >60 mL/min   GFR calc Af Amer >60 >60 mL/min   Anion gap 9 5 - 15    Comment: Performed at [facility], [address] [place], [place] [address]  CBC     Status: Abnormal   Collection Time: 04/07/18 11:22 PM  Result Value Ref Range   WBC 15.6 (H) 4.0 - 10.5 K/uL   RBC 3.50 (L) 3.87 - 5.11 MIL/uL   Hemoglobin 11.8 (L) 12.0 - 15.0 g/dL   HCT 35.4 (L) 36.0 - 46.0 %   MCV 101.1 (H) 80.0 - 100.0 fL   MCH 33.7 26.0 - 34.0 pg   MCHC 33.3 30.0 - 36.0 g/dL   RDW 11.7 11.5 - 15.5 %   Platelets 370 150 - 400 K/uL   nRBC 0.0 0.0 - 0.2 %    Comment: Performed at [facility], [address] [place],  [address]  Urinalysis,  Complete w Microscopic     Status: Abnormal   Collection Time: 04/07/18 11:22 PM  Result  Value Ref Range   Color, Urine STRAW (A) YELLOW   APPearance CLEAR (A) CLEAR   Specific Gravity, Urine 1.004 (L) 1.005 - 1.030   pH 8.0 5.0 - 8.0   Glucose, UA NEGATIVE NEGATIVE mg/dL   Hgb urine dipstick SMALL (A) NEGATIVE   Bilirubin Urine NEGATIVE NEGATIVE   Ketones, ur NEGATIVE NEGATIVE mg/dL   Protein, ur NEGATIVE NEGATIVE mg/dL   Nitrite NEGATIVE NEGATIVE   Leukocytes,Ua NEGATIVE NEGATIVE   RBC / HPF 0-5 0 - 5 RBC/hpf   WBC, UA 0-5 0 - 5 WBC/hpf   Bacteria, UA NONE SEEN NONE SEEN   Squamous Epithelial / LPF 0-5 0 - 5    Comment: Performed at [facility], [address] [place], [place] [address]  Troponin I - ONCE - STAT     Status: None   Collection Time: 04/07/18 11:22 PM  Result Value Ref Range   Troponin I <0.03 <0.03 ng/mL    Comment: Performed at [facility], [address] [place], [place] [address]  Magnesium     Status: None   Collection Time: 04/07/18 11:22 PM  Result Value Ref Range   Magnesium 2.0 1.7 - 2.4 mg/dL    Comment: Performed at [facility], [place], [place] [address]   Ct Angio Chest Pe W And/or Wo Contrast  Result Date: 04/08/2018 CLINICAL DATA:  Gastrointestinal upset with pain, nausea and reflux. Back surgery at T10 on Thursday. EXAM: CT ANGIOGRAPHY CHEST CT ABDOMEN AND PELVIS WITH CONTRAST TECHNIQUE: Multidetector CT imaging of the chest was performed using the standard protocol during bolus administration of intravenous contrast. Multiplanar CT image reconstructions and MIPs were obtained to evaluate the vascular anatomy. Multidetector CT imaging of the abdomen and pelvis was performed using the standard protocol during bolus administration of intravenous contrast. CONTRAST:  29mL OMNIPAQUE IOHEXOL 350 MG/ML SOLN COMPARISON:  04/05/2018 intraoperative views of the thoracic spine, thoracic spine radiographs  03/21/2018 FINDINGS: CTA CHEST FINDINGS Cardiovascular: Cardiomegaly without pericardial effusion. Nonaneurysmal ectatic thoracic aorta measuring up to 3.7 cm. No dissection. Satisfactory pulmonary arterial opacification to the segmental level without acute pulmonary embolus. Great vessels are within normal limits. Mediastinum/Nodes: Small mediastinal lymph nodes without pathologic enlargement. Patent trachea and mainstem bronchi. Slight dilatation of the mid and distal esophagus without focal abnormality. The thyroid gland is unremarkable. Lungs/Pleura: Scarring at the apices. Centrilobular emphysema scattered bilateral multilobar bronchiectasis and scattered areas of inspissated mucus within areas of bronchiectasis are noted in the right upper, right middle and both lower lobes. Eventration of the left hemidiaphragm is present. Musculoskeletal: Lower thoracic kyphoplasties from T9 caudad to at least the L1 level on the thoracic spine series. Osteopenic appearance of the thoracic spine. Mild superior endplate compression of T8 and inferior endplate compression of T7. Review of the MIP images confirms the above findings. CT ABDOMEN and PELVIS FINDINGS Hepatobiliary: Cholecystectomy. Mild reservoir effect accounting for intrahepatic ductal dilatation. Dilatation of the common bile duct at the pancreatic head to 14 mm in diameter without stones. Pancreas: Unremarkable without ductal dilatation, inflammation or mass. Spleen: Normal in size without focal abnormality. Adrenals/Urinary Tract: Normal bilateral adrenal glands. Simple cyst arising off the upper pole the left kidney measuring up to 3.2 cm. No nephrolithiasis nor hydroureteronephrosis. Urinary bladder is distended without focal mural thickening, intraluminal mass or calculus. Stomach/Bowel: There is a small moderate-sized hiatal hernia. The stomach is decompressed in appearance. There is normal small bowel rotation. No small bowel obstruction or inflammation.  Moderate stool retention is noted within the colon. Sigmoid diverticulosis without acute diverticulitis is identified. The appendix is not confidently identified but no pericecal inflammation is seen. Vascular/Lymphatic: Moderate aortoiliac atherosclerosis. No aneurysm. No dissection. No lymphadenopathy. Reproductive: Calcified uterine fibroid.  No adnexal mass. Other: No free air or free fluid.  No hernia. Musculoskeletal: T9 through L3 kyphoplasty without complicating features. Chronic superior endplate compressions of L4 and L5 without kyphoplasty cement. Review of the MIP images confirms the above findings. IMPRESSION: Chest CT: 1. Cardiomegaly with ectatic thoracic aorta. No acute pulmonary embolus. 2. Emphysema of the lungs with bronchiectasis. CT AP: 1. Chronic compression deformities of the thoracic and lumbar spine as above with kyphoplasty changes from T9 through L3. 2. Status post cholecystectomy with reservoir effect accounting for intrahepatic and extrahepatic ductal dilatation. No choledocholithiasis. 3. Sigmoid diverticulosis without acute diverticulitis. 4. Moderate sized hiatal hernia. Eventration of the left hemidiaphragm. No bowel obstruction or inflammation. 5. Simple cyst arising off the upper the left kidney measuring up to 3.2 cm. 6. Calcified uterine fibroid. Electronically Signed   By: Ashley Royalty M.D.   On: 04/08/2018 03:14   Ct Abdomen Pelvis W Contrast  Result Date: 04/08/2018 CLINICAL DATA:  Gastrointestinal upset with pain, nausea and reflux. Back surgery at T10 on Thursday. EXAM: CT ANGIOGRAPHY CHEST CT ABDOMEN AND PELVIS WITH CONTRAST TECHNIQUE: Multidetector CT imaging of the chest was performed using the standard protocol during bolus administration of intravenous contrast. Multiplanar CT image reconstructions and MIPs were obtained to evaluate the vascular anatomy. Multidetector CT imaging of the abdomen and pelvis was performed using the standard protocol during bolus  administration of intravenous contrast. CONTRAST:  46mL OMNIPAQUE IOHEXOL 350 MG/ML SOLN COMPARISON:  04/05/2018 intraoperative views of the thoracic spine, thoracic spine radiographs 03/21/2018 FINDINGS: CTA CHEST FINDINGS Cardiovascular: Cardiomegaly without pericardial effusion. Nonaneurysmal ectatic thoracic aorta measuring up to 3.7 cm. No dissection. Satisfactory pulmonary arterial opacification to the segmental level without acute pulmonary embolus. Great vessels are within normal limits. Mediastinum/Nodes: Small mediastinal lymph nodes without pathologic enlargement. Patent trachea and mainstem bronchi. Slight dilatation of the mid and distal esophagus without focal abnormality. The thyroid gland is unremarkable. Lungs/Pleura: Scarring at the apices. Centrilobular emphysema scattered bilateral multilobar bronchiectasis and scattered areas of inspissated mucus within areas of bronchiectasis are noted in the right upper, right middle and both lower lobes. Eventration of the left hemidiaphragm is present. Musculoskeletal: Lower thoracic kyphoplasties from T9 caudad to at least the L1 level on the thoracic spine series. Osteopenic appearance of the thoracic spine. Mild superior endplate compression of T8 and inferior endplate compression of T7. Review of the MIP images confirms the above findings. CT ABDOMEN and PELVIS FINDINGS Hepatobiliary: Cholecystectomy. Mild reservoir effect accounting for intrahepatic ductal dilatation. Dilatation of the common bile duct at the pancreatic head to 14 mm in diameter without stones. Pancreas: Unremarkable without ductal dilatation, inflammation or mass. Spleen: Normal in size without focal abnormality. Adrenals/Urinary Tract: Normal bilateral adrenal glands. Simple cyst arising off the upper pole the left kidney measuring up to 3.2 cm. No nephrolithiasis nor hydroureteronephrosis. Urinary bladder is distended without focal mural thickening, intraluminal mass or calculus.  Stomach/Bowel: There is a small moderate-sized hiatal hernia. The stomach is decompressed in appearance. There is normal small bowel rotation. No small bowel obstruction or inflammation. Moderate stool retention is noted within the colon. Sigmoid diverticulosis without acute diverticulitis is identified. The appendix is not confidently identified but no pericecal inflammation is seen. Vascular/Lymphatic: Moderate aortoiliac atherosclerosis. No  aneurysm. No dissection. No lymphadenopathy. Reproductive: Calcified uterine fibroid.  No adnexal mass. Other: No free air or free fluid.  No hernia. Musculoskeletal: T9 through L3 kyphoplasty without complicating features. Chronic superior endplate compressions of L4 and L5 without kyphoplasty cement. Review of the MIP images confirms the above findings. IMPRESSION: Chest CT: 1. Cardiomegaly with ectatic thoracic aorta. No acute pulmonary embolus. 2. Emphysema of the lungs with bronchiectasis. CT AP: 1. Chronic compression deformities of the thoracic and lumbar spine as above with kyphoplasty changes from T9 through L3. 2. Status post cholecystectomy with reservoir effect accounting for intrahepatic and extrahepatic ductal dilatation. No choledocholithiasis. 3. Sigmoid diverticulosis without acute diverticulitis. 4. Moderate sized hiatal hernia. Eventration of the left hemidiaphragm. No bowel obstruction or inflammation. 5. Simple cyst arising off the upper the left kidney measuring up to 3.2 cm. 6. Calcified uterine fibroid. Electronically Signed   By: Ashley Royalty M.D.   On: 04/08/2018 03:14   Ct T-spine No Charge  Result Date: 04/08/2018 CLINICAL DATA:  Chest pain. Status post multiple kyphoplasties, most recent April 05, 2018. EXAM: CT THORACIC SPINE WITHOUT CONTRAST TECHNIQUE: Reformatted CT images of the thoracic were obtained using the standard protocol without intravenous contrast. COMPARISON:  Thoracolumbar spine radiographs March 21 2018 FINDINGS: ALIGNMENT:  Maintained thoracic kyphosis. No malalignment. VERTEBRAE: Status post T9 through L1 vertebral body cement augmentation, new at T10. Mild T1, T5, T7 and moderate T8 compression fractures, new at T7. Limited comparison of small upper thoracic compression fractures on prior radiographs. Osteopenia. No destructive bony lesions. PARASPINAL AND OTHER SOFT TISSUES: Please see CT of chest from same day, reported separately. DISC LEVELS: No canal stenosis. Severe LEFT T9-10, moderate T10-11 and T11-12 neural foraminal narrowing. IMPRESSION: 1. Status post T9 through L1 vertebral bodies cement augmentation. 2. Mild T1, T5, T7 and moderate T8 compression fractures, new at T7. 3. Osteopenia. Electronically Signed   By: Elon Alas M.D.   On: 04/08/2018 03:24    Pending Labs Unresulted Labs (From admission, onward)    Start     Ordered   Signed and Held  TSH  Add-on,   R     Signed and Held          Vitals/Pain Today's Vitals   04/08/18 0400 04/08/18 0417 04/08/18 0500 04/08/18 0600  BP: (!) 172/104  (!) 155/95 (!) 172/108  Pulse: 95  84 87  Resp: (!) 22  (!) 22 (!) 21  Temp:      SpO2: 100%  100% 100%  Weight:      PainSc:  5       Isolation Precautions No active isolations  Medications Medications  potassium chloride 10 mEq in 100 mL IVPB (10 mEq Intravenous New Bag/Given 04/08/18 0624)  sodium chloride 0.9 % bolus 500 mL (0 mLs Intravenous Stopped 04/08/18 0430)  iohexol (OMNIPAQUE) 350 MG/ML injection 75 mL (75 mLs Intravenous Contrast Given 04/08/18 0048)  morphine 4 MG/ML injection 4 mg (4 mg Intravenous Given 04/08/18 0103)  ondansetron (ZOFRAN) injection 4 mg (4 mg Intravenous Given 04/08/18 0102)  potassium chloride (KLOR-CON) packet 40 mEq (40 mEq Oral Given 04/08/18 0416)  ondansetron (ZOFRAN) injection 4 mg (4 mg Intravenous Given 04/08/18 0413)  potassium chloride 10 mEq in 100 mL IVPB ( Intravenous Stopped 04/08/18 0618)  metoCLOPramide (REGLAN) injection 5 mg (5 mg Intravenous Given  04/08/18 0631)  0.9 %  sodium chloride infusion ( Intravenous New Bag/Given 04/08/18 0631)    Mobility walks with device Low fall risk  Focused Assessments Cardiac Assessment Handoff:    Lab Results  Component Value Date   TROPONINI <0.03 04/07/2018   No results found for: DDIMER Does the Patient currently have chest pain? No  , Neuro Assessment Handoff:  Swallow screen pass? Yes          Neuro Assessment:   Neuro Checks:      Last Documented NIHSS Modified Score:   Has TPA been given? No If patient is a Neuro Trauma and patient is going to OR before floor call report to [place] nurse: [phone] or [phone]  , Pulmonary Assessment Handoff:  Lung sounds:   O2 Device: Room Air O2 Flow Rate (L/min): 2 L/min      R Recommendations: See Admitting Provider Note  Report given to:   Additional Notes:  Family at bedside

## 2018-04-08 NOTE — ED Notes (Addendum)
Pt reports not wanting IV fluids or additional IV if possible, reports pain at IV site, IV flushes and pulls back blood

## 2018-04-09 LAB — CBC
HCT: 34.1 % — ABNORMAL LOW (ref 36.0–46.0)
Hemoglobin: 11.3 g/dL — ABNORMAL LOW (ref 12.0–15.0)
MCH: 33.7 pg (ref 26.0–34.0)
MCHC: 33.1 g/dL (ref 30.0–36.0)
MCV: 101.8 fL — ABNORMAL HIGH (ref 80.0–100.0)
Platelets: 352 10*3/uL (ref 150–400)
RBC: 3.35 MIL/uL — ABNORMAL LOW (ref 3.87–5.11)
RDW: 11.7 % (ref 11.5–15.5)
WBC: 19.5 10*3/uL — ABNORMAL HIGH (ref 4.0–10.5)
nRBC: 0 % (ref 0.0–0.2)

## 2018-04-09 LAB — BASIC METABOLIC PANEL
ANION GAP: 10 (ref 5–15)
BUN: 9 mg/dL (ref 8–23)
CO2: 30 mmol/L (ref 22–32)
Calcium: 9.1 mg/dL (ref 8.9–10.3)
Chloride: 93 mmol/L — ABNORMAL LOW (ref 98–111)
Creatinine, Ser: 0.55 mg/dL (ref 0.44–1.00)
GFR calc Af Amer: >60 mL/min (ref >60)
GLUCOSE: 154 mg/dL — AB (ref 70–99)
Potassium: 3.9 mmol/L (ref 3.5–5.1)
Sodium: 133 mmol/L — ABNORMAL LOW (ref 135–145)

## 2018-04-09 LAB — PHOSPHORUS: Phosphorus: 3 mg/dL (ref 2.5–4.6)

## 2018-04-09 LAB — MAGNESIUM: Magnesium: 1.8 mg/dL (ref 1.7–2.4)

## 2018-04-09 MED ORDER — DILTIAZEM HCL ER 60 MG PO CP12
60.0000 mg | ORAL_CAPSULE | Freq: Two times a day (BID) | ORAL | Status: DC
  Administered 2018-04-09 – 2018-04-10 (×3): 60 mg via ORAL
  Filled 2018-04-09 (×5): qty 1

## 2018-04-09 MED ORDER — TRAMADOL HCL 50 MG PO TABS
50.0000 mg | ORAL_TABLET | Freq: Four times a day (QID) | ORAL | Status: DC | PRN
  Administered 2018-04-09 – 2018-04-11 (×3): 50 mg via ORAL
  Filled 2018-04-09 (×3): qty 1

## 2018-04-09 MED ORDER — PANTOPRAZOLE SODIUM 40 MG PO TBEC
40.0000 mg | DELAYED_RELEASE_TABLET | Freq: Every day | ORAL | Status: DC
  Administered 2018-04-09 – 2018-04-14 (×6): 40 mg via ORAL
  Filled 2018-04-09 (×6): qty 1

## 2018-04-09 MED ORDER — MAGNESIUM SULFATE 2 GM/50ML IV SOLN
2.0000 g | Freq: Once | INTRAVENOUS | Status: AC
  Administered 2018-04-09: 2 g via INTRAVENOUS
  Filled 2018-04-09: qty 50

## 2018-04-09 NOTE — Progress Notes (Signed)
[place] [facility] [place] NAME: Ana Washington    MR#:  [id number]  DATE OF BIRTH:  January 28, 1946  SUBJECTIVE:   Transferred to the ICU yesterday due to A. fib with RVR.  Now has been weaned off the Cardizem drip and oral Cardizem.  Denies any further abdominal pain no nausea vomiting and tolerated some breakfast well.  REVIEW OF SYSTEMS:    Review of Systems  Constitutional: Negative for chills and fever.  HENT: Negative for congestion and tinnitus.   Eyes: Negative for blurred vision and double vision.  Respiratory: Negative for cough, shortness of breath and wheezing.   Cardiovascular: Negative for chest pain, orthopnea and PND.  Gastrointestinal: Negative for abdominal pain, diarrhea, nausea and vomiting.  Genitourinary: Negative for dysuria and hematuria.  Neurological: Negative for dizziness, sensory change and focal weakness.  All other systems reviewed and are negative.   Nutrition: Heart Healthy Tolerating Diet: Yes Tolerating PT: Ambulatory  DRUG ALLERGIES:   Allergies  Allergen Reactions  . Sulfa Antibiotics Diarrhea  . Codeine Nausea And Vomiting  . Penicillins Rash    Has patient had a PCN reaction causing immediate rash, facial/tongue/throat swelling, SOB or lightheadedness with hypotension: Unknown Has patient had a PCN reaction causing severe rash involving mucus membranes or skin necrosis: Unknown Has patient had a PCN reaction that required hospitalization: Unknown Has patient had a PCN reaction occurring within the last 10 years: No If all of the above answers are "NO", then may proceed with Cephalosporin use.     VITALS:  Blood pressure 127/74, pulse 76, temperature 98.7 F (37.1 C), resp. rate 16, height 5' (1.524 m), weight 47.3 kg, SpO2 98 %.  PHYSICAL EXAMINATION:   Physical Exam  GENERAL:  73 y.o.-year-old thin patient lying in bed in mild distress.  EYES: Pupils equal, round, reactive to light and  accommodation. No scleral icterus. Extraocular muscles intact.  HEENT: Head atraumatic, normocephalic. Oropharynx and nasopharynx clear.  NECK:  Supple, no jugular venous distention. No thyroid enlargement, no tenderness.  LUNGS: Normal breath sounds bilaterally, no wheezing, rales, rhonchi. + use of accessory muscles of respiration.  CARDIOVASCULAR: S1, S2 Irregular. No murmurs, rubs, or gallops.  ABDOMEN: Soft, nontender, nondistended. Bowel sounds present. No organomegaly or mass.  EXTREMITIES: No cyanosis, clubbing or edema b/l.    NEUROLOGIC: Cranial nerves II through XII are intact. No focal Motor or sensory deficits b/l.   PSYCHIATRIC: The patient is alert and oriented x 3.  SKIN: No obvious rash, lesion, or ulcer.    LABORATORY PANEL:   CBC Recent Labs  Lab 04/09/18 1244  WBC 19.5*  HGB 11.3*  HCT 34.1*  PLT 352   ------------------------------------------------------------------------------------------------------------------  Chemistries  Recent Labs  Lab 04/07/18 2322  04/09/18 1244  NA 135   < > 133*  K 3.0*   < > 3.9  CL 90*   < > 93*  CO2 36*   < > 30  GLUCOSE 111*   < > 154*  BUN 7*   < > 9  CREATININE 0.54   < > 0.55  CALCIUM 8.8*   < > 9.1  MG 2.0   < > 1.8  AST 24  --   --   ALT 13  --   --   ALKPHOS 74  --   --   BILITOT 0.8  --   --    < > = values in this interval not displayed.   ------------------------------------------------------------------------------------------------------------------  Cardiac Enzymes Recent Labs  Lab 04/07/18 2322  TROPONINI <0.03   ------------------------------------------------------------------------------------------------------------------  RADIOLOGY:  Ct Angio Chest Pe W And/or Wo Contrast  Result Date: 04/08/2018 CLINICAL DATA:  Gastrointestinal upset with pain, nausea and reflux. Back surgery at [facility] on Thursday. EXAM: CT ANGIOGRAPHY CHEST CT ABDOMEN AND PELVIS WITH CONTRAST TECHNIQUE: Multidetector CT  imaging of the chest was performed using the standard protocol during bolus administration of intravenous contrast. Multiplanar CT image reconstructions and MIPs were obtained to evaluate the vascular anatomy. Multidetector CT imaging of the abdomen and pelvis was performed using the standard protocol during bolus administration of intravenous contrast. CONTRAST:  66mL OMNIPAQUE IOHEXOL 350 MG/ML SOLN COMPARISON:  04/05/2018 intraoperative views of the thoracic spine, thoracic spine radiographs 03/21/2018 FINDINGS: CTA CHEST FINDINGS Cardiovascular: Cardiomegaly without pericardial effusion. Nonaneurysmal ectatic thoracic aorta measuring up to 3.7 cm. No dissection. Satisfactory pulmonary arterial opacification to the segmental level without acute pulmonary embolus. Great vessels are within normal limits. Mediastinum/Nodes: Small mediastinal lymph nodes without pathologic enlargement. Patent trachea and mainstem bronchi. Slight dilatation of the mid and distal esophagus without focal abnormality. The thyroid gland is unremarkable. Lungs/Pleura: Scarring at the apices. Centrilobular emphysema scattered bilateral multilobar bronchiectasis and scattered areas of inspissated mucus within areas of bronchiectasis are noted in the right upper, right middle and both lower lobes. Eventration of the left hemidiaphragm is present. Musculoskeletal: Lower thoracic kyphoplasties from T9 caudad to at least the L1 level on the thoracic spine series. Osteopenic appearance of the thoracic spine. Mild superior endplate compression of T8 and inferior endplate compression of T7. Review of the MIP images confirms the above findings. CT ABDOMEN and PELVIS FINDINGS Hepatobiliary: Cholecystectomy. Mild reservoir effect accounting for intrahepatic ductal dilatation. Dilatation of the common bile duct at the pancreatic head to 14 mm in diameter without stones. Pancreas: Unremarkable without ductal dilatation, inflammation or mass. Spleen:  Normal in size without focal abnormality. Adrenals/Urinary Tract: Normal bilateral adrenal glands. Simple cyst arising off the upper pole the left kidney measuring up to 3.2 cm. No nephrolithiasis nor hydroureteronephrosis. Urinary bladder is distended without focal mural thickening, intraluminal mass or calculus. Stomach/Bowel: There is a small moderate-sized hiatal hernia. The stomach is decompressed in appearance. There is normal small bowel rotation. No small bowel obstruction or inflammation. Moderate stool retention is noted within the colon. Sigmoid diverticulosis without acute diverticulitis is identified. The appendix is not confidently identified but no pericecal inflammation is seen. Vascular/Lymphatic: Moderate aortoiliac atherosclerosis. No aneurysm. No dissection. No lymphadenopathy. Reproductive: Calcified uterine fibroid.  No adnexal mass. Other: No free air or free fluid.  No hernia. Musculoskeletal: T9 through L3 kyphoplasty without complicating features. Chronic superior endplate compressions of L4 and L5 without kyphoplasty cement. Review of the MIP images confirms the above findings. IMPRESSION: Chest CT: 1. Cardiomegaly with ectatic thoracic aorta. No acute pulmonary embolus. 2. Emphysema of the lungs with bronchiectasis. CT AP: 1. Chronic compression deformities of the thoracic and lumbar spine as above with kyphoplasty changes from T9 through L3. 2. Status post cholecystectomy with reservoir effect accounting for intrahepatic and extrahepatic ductal dilatation. No choledocholithiasis. 3. Sigmoid diverticulosis without acute diverticulitis. 4. Moderate sized hiatal hernia. Eventration of the left hemidiaphragm. No bowel obstruction or inflammation. 5. Simple cyst arising off the upper the left kidney measuring up to 3.2 cm. 6. Calcified uterine fibroid. Electronically Signed   By: Ashley Royalty M.D.   On: 04/08/2018 03:14   Ct Abdomen Pelvis W Contrast  Result Date: 04/08/2018 CLINICAL DATA:  Gastrointestinal upset with pain, nausea and reflux. Back surgery at T10 on Thursday. EXAM: CT ANGIOGRAPHY CHEST CT ABDOMEN AND PELVIS WITH CONTRAST TECHNIQUE: Multidetector CT imaging of the chest was performed using the standard protocol during bolus administration of intravenous contrast. Multiplanar CT image reconstructions and MIPs were obtained to evaluate the vascular anatomy. Multidetector CT imaging of the abdomen and pelvis was performed using the standard protocol during bolus administration of intravenous contrast. CONTRAST:  2mL OMNIPAQUE IOHEXOL 350 MG/ML SOLN COMPARISON:  04/05/2018 intraoperative views of the thoracic spine, thoracic spine radiographs 03/21/2018 FINDINGS: CTA CHEST FINDINGS Cardiovascular: Cardiomegaly without pericardial effusion. Nonaneurysmal ectatic thoracic aorta measuring up to 3.7 cm. No dissection. Satisfactory pulmonary arterial opacification to the segmental level without acute pulmonary embolus. Great vessels are within normal limits. Mediastinum/Nodes: Small mediastinal lymph nodes without pathologic enlargement. Patent trachea and mainstem bronchi. Slight dilatation of the mid and distal esophagus without focal abnormality. The thyroid gland is unremarkable. Lungs/Pleura: Scarring at the apices. Centrilobular emphysema scattered bilateral multilobar bronchiectasis and scattered areas of inspissated mucus within areas of bronchiectasis are noted in the right upper, right middle and both lower lobes. Eventration of the left hemidiaphragm is present. Musculoskeletal: Lower thoracic kyphoplasties from T9 caudad to at least the L1 level on the thoracic spine series. Osteopenic appearance of the thoracic spine. Mild superior endplate compression of T8 and inferior endplate compression of T7. Review of the MIP images confirms the above findings. CT ABDOMEN and PELVIS FINDINGS Hepatobiliary: Cholecystectomy. Mild reservoir effect accounting for intrahepatic ductal dilatation.  Dilatation of the common bile duct at the pancreatic head to 14 mm in diameter without stones. Pancreas: Unremarkable without ductal dilatation, inflammation or mass. Spleen: Normal in size without focal abnormality. Adrenals/Urinary Tract: Normal bilateral adrenal glands. Simple cyst arising off the upper pole the left kidney measuring up to 3.2 cm. No nephrolithiasis nor hydroureteronephrosis. Urinary bladder is distended without focal mural thickening, intraluminal mass or calculus. Stomach/Bowel: There is a small moderate-sized hiatal hernia. The stomach is decompressed in appearance. There is normal small bowel rotation. No small bowel obstruction or inflammation. Moderate stool retention is noted within the colon. Sigmoid diverticulosis without acute diverticulitis is identified. The appendix is not confidently identified but no pericecal inflammation is seen. Vascular/Lymphatic: Moderate aortoiliac atherosclerosis. No aneurysm. No dissection. No lymphadenopathy. Reproductive: Calcified uterine fibroid.  No adnexal mass. Other: No free air or free fluid.  No hernia. Musculoskeletal: T9 through L3 kyphoplasty without complicating features. Chronic superior endplate compressions of L4 and L5 without kyphoplasty cement. Review of the MIP images confirms the above findings. IMPRESSION: Chest CT: 1. Cardiomegaly with ectatic thoracic aorta. No acute pulmonary embolus. 2. Emphysema of the lungs with bronchiectasis. CT AP: 1. Chronic compression deformities of the thoracic and lumbar spine as above with kyphoplasty changes from T9 through L3. 2. Status post cholecystectomy with reservoir effect accounting for intrahepatic and extrahepatic ductal dilatation. No choledocholithiasis. 3. Sigmoid diverticulosis without acute diverticulitis. 4. Moderate sized hiatal hernia. Eventration of the left hemidiaphragm. No bowel obstruction or inflammation. 5. Simple cyst arising off the upper the left kidney measuring up to 3.2  cm. 6. Calcified uterine fibroid. Electronically Signed   By: Ashley Royalty M.D.   On: 04/08/2018 03:14   Ct T-spine No Charge  Result Date: 04/08/2018 CLINICAL DATA:  Chest pain. Status post multiple kyphoplasties, most recent April 05, 2018. EXAM: CT THORACIC SPINE WITHOUT CONTRAST TECHNIQUE: Reformatted CT images of the thoracic were obtained using the standard protocol without intravenous contrast.  COMPARISON:  Thoracolumbar spine radiographs March 21 2018 FINDINGS: ALIGNMENT: Maintained thoracic kyphosis. No malalignment. VERTEBRAE: Status post T9 through L1 vertebral body cement augmentation, new at T10. Mild T1, T5, T7 and moderate T8 compression fractures, new at T7. Limited comparison of small upper thoracic compression fractures on prior radiographs. Osteopenia. No destructive bony lesions. PARASPINAL AND OTHER SOFT TISSUES: Please see CT of chest from same day, reported separately. DISC LEVELS: No canal stenosis. Severe LEFT T9-10, moderate T10-11 and T11-12 neural foraminal narrowing. IMPRESSION: 1. Status post T9 through L1 vertebral bodies cement augmentation. 2. Mild T1, T5, T7 and moderate T8 compression fractures, new at T7. 3. Osteopenia. Electronically Signed   By: Elon Alas M.D.   On: 04/08/2018 03:24     ASSESSMENT AND PLAN:   73 year old female with past medical history of chronic respiratory failure secondary to bronchiectasis, back pain secondary to compression fracture status post recent kyphoplasty, history of peptic ulcer disease, hypertension, GERD, coronary disease, atrial fibrillation who presents to the hospital due to intractable nausea vomiting and abdominal pain.  1.  Intractable nausea vomiting abdominal pain-etiology unclear, patient CT abdomen pelvis was negative for acute pathology.  Patient LFTs are stable lipase is normal.  Suspect this is likely gastritis/GERD.   -Much improved, continue PPI, Maalox.  2.  Atrial fibrillation with rapid ventricular  response- patient developed A. fib with RVR yesterday and had to be transferred to the intensive care unit and placed on Cardizem drip.  Heart rates have much improved since yesterday. -Appreciate cardiology input.  Continue oral Cardizem, Toprol. -Continue Eliquis.  3.  Shortness of breath- patient has chronic shortness of breath due to her bronchiectasis.  She is not significantly hypoxic.  Continue supportive care O2 supplementation for now.  Treat underlying anxiety.  4.  Hypokalemia-secondary to the nausea vomiting. - improved with supplementation.  Mg. Level normal.   5.  Essential hypertension-continue losartan, Toprol, Norvasc.  6.  GERD- continue Protonix, Carafate    All the records are reviewed and case discussed with Care Management/Social Worker. Management plans discussed with the patient, family and they are in agreement.  CODE STATUS: Full code  DVT Prophylaxis: Eliquis  TOTAL TIME TAKING CARE OF THIS PATIENT: 30 minutes.   POSSIBLE D/C IN 1-2 DAYS, DEPENDING ON CLINICAL CONDITION.   Henreitta Leber M.D on 04/09/2018 at 3:13 PM  Between 7am to 6pm - Pager - [phone]  After 6pm go to [email or web address] - Proofreader  [facility]  Office  [phone]  CC: Primary care physician; [name], MD

## 2018-04-09 NOTE — Progress Notes (Signed)
Remains on diltiazem gtt at this time; waiting for orders to transition to PO. Sat up in chair for breakfast; weak but no adverse effects. VS stable. Patient seems anxious, did receive PRN PO Ativan. Likely appropriate for med tele unit.

## 2018-04-09 NOTE — Plan of Care (Signed)
Pt still in afib, but heart rate now in 80-90 range. Voids to bedpan.  Problem: Education: Goal: Knowledge of General Education information will improve Description Including pain rating scale, medication(s)/side effects and non-pharmacologic comfort measures Outcome: Progressing   Problem: Health Behavior/Discharge Planning: Goal: Ability to manage health-related needs will improve Outcome: Progressing   Problem: Clinical Measurements: Goal: Ability to maintain clinical measurements within normal limits will improve Outcome: Progressing Goal: Will remain free from infection Outcome: Progressing Goal: Diagnostic test results will improve Outcome: Progressing Goal: Respiratory complications will improve Outcome: Progressing Goal: Cardiovascular complication will be avoided Outcome: Progressing   Problem: Activity: Goal: Risk for activity intolerance will decrease Outcome: Progressing   Problem: Nutrition: Goal: Adequate nutrition will be maintained Outcome: Progressing   Problem: Coping: Goal: Level of anxiety will decrease Outcome: Progressing   Problem: Elimination: Goal: Will not experience complications related to bowel motility Outcome: Progressing Goal: Will not experience complications related to urinary retention Outcome: Progressing   Problem: Pain Managment: Goal: General experience of comfort will improve Outcome: Progressing   Problem: Safety: Goal: Ability to remain free from injury will improve Outcome: Progressing   Problem: Skin Integrity: Goal: Risk for impaired skin integrity will decrease Outcome: Progressing

## 2018-04-09 NOTE — Progress Notes (Addendum)
Pharmacy Electrolyte Monitoring Consult:  Pharmacy consulted to assist in monitoring and replacing electrolytes in this 73 y.o. female admitted on 04/07/2018 with Abdominal Pain and Nausea   Labs:  Sodium (mmol/L)  Date Value  04/09/2018 133 (L)  03/01/2018 138   Potassium (mmol/L)  Date Value  04/09/2018 3.9   Magnesium (mg/dL)  Date Value  04/09/2018 1.8   Phosphorus (mg/dL)  Date Value  04/09/2018 3.0   Calcium (mg/dL)  Date Value  04/09/2018 9.1   Albumin (g/dL)  Date Value  04/07/2018 3.5  03/01/2018 3.8    Assessment/Plan: Patient received total of 17mEq of IV potassium replacement on 3/8. Patient is ordered potassium 62mEq PO daily.   Will order magnesium 2g IV x 1 and will defer potassium replacement on 3/9.    Patient transferred to ICU for atrial fibrillation with RVR requiring diltiazem infusion. Will replace for goal potassium ~ 4 and goal magnesium ~ 2.   Pharmacy will continue to monitor and adjust per consult.   Simpson,Michael L 04/09/2018 3:24 PM

## 2018-04-09 NOTE — Progress Notes (Signed)
Patient tolerating PO intake. Now on PO Dilt. Saline locked all lines. Attempted to call report to 1C X 1; awaiting call back.

## 2018-04-09 NOTE — Consult Note (Signed)
Cardiology Consultation Note    Patient ID: Ana Washington, MRN: [id number], DOB/AGE: 1945/07/17 73 y.o. Admit date: 04/07/2018   Date of Consult: 04/09/2018 Primary Physician: [name], MD Primary Cardiologist: Dr. Ubaldo Glassing  Chief Complaint: afib with rvr Reason for Consultation: afib with rvr Requesting MD: Dr. Verdell Carmine  HPI: Ana Washington is a 73 y.o. female with history of paroxysmal atrial fibrillation, coronary artery disease, back pain who recently underwent a kyphoplasty.  She presented to the emergency room with nausea and vomiting.  Her kyphoplasty was on March 5 and she presented on March 8 and has been not able to keep any liquids or food down since that time.  In the emergency room she was noted to be in atrial fibrillation with rapid ventricular response.  Laboratories revealed hypokalemia with a potassium of 3.0.  Magnesium was 2.0.  She was given IV Cardizem bolus and placed on a Cardizem drip with improvement in her rate.  She was transferred to the ICU for more close follow-up.  This morning she remains in atrial fibrillation with a rate of 80-90.  She has ruled out for myocardial infarction.  She complains of back pain but no chest pain.  She is hemodynamically stable.  She had a history of talk at [facility] syndrome with cath at [facility] showing normal coronaries.  She is currently on metoprolol for rate control and has been anticoagulated with apixaban 5 mg twice daily.  She uses Cardizem 30 mg for breakthrough.  Past Medical History:  Diagnosis Date  . Arthritis   . Atrial fibrillation ([place])   . Bronchiectasis ([place])   . CAD (coronary artery disease)   . CAD (coronary artery disease) 07/30/2017  . Dumping syndrome   . Dyspnea   . Dysrhythmia    afib  . 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 Family history of adverse reaction to anesthesia    sister [name]  . GERD (gastroesophageal reflux disease)   . Headache    MIGRAINES  . Hypertension    . Lung disease   . Myocardial infarction ([place]) 2007   Non-STEMI  . [name] (postoperative nausea and vomiting)   . Psoriasis   . PUD (peptic ulcer disease)       Surgical History:  Past Surgical History:  Procedure Laterality Date  . BACK SURGERY    . CHOLECYSTECTOMY    . EYE SURGERY    . FOOT SURGERY    . KYPHOPLASTY N/A 07/05/2016   Procedure: KYPHOPLASTY T - 9;  Surgeon: Hessie Knows, MD;  Location: [facility] ORS;  Service: Orthopedics;  Laterality: N/A;  . KYPHOPLASTY N/A 11/29/2017   Procedure: Iona Hansen;  Surgeon: Hessie Knows, MD;  Location: [facility] ORS;  Service: Orthopedics;  Laterality: N/A;  L2 and L3  . KYPHOPLASTY N/A 12/18/2017   Procedure: KYPHOPLASTY L1;  Surgeon: Hessie Knows, MD;  Location: [facility] ORS;  Service: Orthopedics;  Laterality: N/A;  . KYPHOPLASTY N/A 01/05/2018   Procedure: KYPHOPLASTY-T11,T12;  Surgeon: Hessie Knows, MD;  Location: [facility] ORS;  Service: Orthopedics;  Laterality: N/A;  . KYPHOPLASTY N/A 04/05/2018   Procedure: T10 KYPHOPLASTY;  Surgeon: Hessie Knows, MD;  Location: [facility] ORS;  Service: Orthopedics;  Laterality: N/A;  . LUNG SURGERY  1990 and 1996  . THOROCOTOMY WITH LOBECTOMY     LEFT LOWER THORACOTOMY / RIGHT MIDDLE LOBECTOMY     Home Meds: Prior to Admission medications   Medication Sig Start Date End Date Taking? Authorizing Provider  acetaminophen (TYLENOL)  500 MG tablet Take 1,000 mg by mouth every 6 (six) hours as needed for mild pain or headache.    Yes [provider]  amLODipine (NORVASC) 5 MG tablet Take 5 mg by mouth daily.  11/04/13   [provider]  apixaban (ELIQUIS) 5 MG TABS tablet Take 5 mg by mouth 2 (two) times daily.     [provider]  Ascorbic Acid (VITAMIN C) 1000 MG tablet Take 1,000 mg by mouth daily.    [provider]  aspirin EC 81 MG tablet Take 81 mg by mouth daily.    [provider]  azithromycin (ZITHROMAX) 250 MG tablet Take 250 mg by mouth daily. continuou  09/09/11   [provider]  Calcium Carbonate-Vitamin D (OYSTER SHELL/VITAMIN D) 600-125 MG-UNIT TABS Take 1 tablet by mouth daily. 11/05/07   [provider]  chlorpheniramine-HYDROcodone (TUSSIONEX) 10-8 MG/5ML SUER Take 6 mLs by mouth at bedtime as needed for cough.     [provider]  Cholecalciferol (VITAMIN D-1000 MAX ST) 1000 UNITS tablet Take 1,000 Units by mouth daily.     [provider]  ferrous sulfate 325 (65 FE) MG EC tablet Take 325 mg by mouth daily.     [provider]  gabapentin (NEURONTIN) 300 MG capsule Take 300 mg by mouth 3 (three) times daily.     [provider]  levofloxacin (LEVAQUIN) 750 MG tablet Take 750 mg by mouth daily.    [provider]  LORazepam (ATIVAN) 0.5 MG tablet Take 0.5 mg by mouth every 4 (four) hours as needed (breathing issues).     [provider]  losartan (COZAAR) 100 MG tablet Take 100 mg by mouth daily.  07/10/13   [provider]  metoprolol succinate (TOPROL-XL) 50 MG 24 hr tablet Take 50 mg by mouth at bedtime.  10/17/13   [provider]  Multiple Vitamin (MULTIVITAMIN WITH MINERALS) TABS tablet Take 1 tablet by mouth daily. Women's 50+    [provider]  omeprazole (PRILOSEC OTC) 20 MG tablet Take 40 mg by mouth daily.     [provider]  potassium chloride (K-DUR) 10 MEQ tablet Take 10 mEq by mouth daily.    [provider]  predniSONE (DELTASONE) 10 MG tablet TAKE 1 TABLET(10 MG) BY MOUTH DAILY WITH BREAKFAST Patient taking differently: Take 10 mg by mouth daily with breakfast.  02/28/18   Laverle Hobby, MD  Probiotic Product (ALIGN) 4 MG CAPS Take 4 mg by mouth daily.    [provider]  sodium chloride 0.9 % nebulizer solution Take 3 mLs by nebulization 3 (three) times daily.  06/07/16   [provider]  sucralfate (CARAFATE) 1 g tablet Take 1 g by mouth 4 (four) times daily.    [provider]   traMADol (ULTRAM) 50 MG tablet Take 1 tablet (50 mg total) by mouth every 6 (six) hours as needed. 01/06/18   Hessie Knows, MD    Inpatient Medications:  . acidophilus  1 capsule Oral Daily  . apixaban  5 mg Oral BID  . aspirin EC  81 mg Oral Daily  . azithromycin  250 mg Oral Daily  . docusate sodium  100 mg Oral BID  . losartan  100 mg Oral Daily  . metoprolol succinate  50 mg Oral QHS  . pantoprazole (PROTONIX) IV  40 mg Intravenous Q12H  . potassium chloride  10 mEq Oral Daily  . predniSONE  10 mg Oral Q breakfast  .  sucralfate  1 g Oral TID WC & HS  . vitamin C  1,000 mg Oral Daily   . diltiazem (CARDIZEM) infusion 5 mg/hr (04/09/18 0700)  . lactated ringers 100 mL/hr at 04/09/18 0700    Allergies:  Allergies  Allergen Reactions  . Sulfa Antibiotics Diarrhea  . Codeine Nausea And Vomiting  . Penicillins Rash    Has patient had a PCN reaction causing immediate rash, facial/tongue/throat swelling, SOB or lightheadedness with hypotension: Unknown Has patient had a PCN reaction causing severe rash involving mucus membranes or skin necrosis: Unknown Has patient had a PCN reaction that required hospitalization: Unknown Has patient had a PCN reaction occurring within the last 10 years: No If all of the above answers are "NO", then may proceed with Cephalosporin use.     Social History   Socioeconomic History  . Marital status: Married    Spouse name: Not on file  . Number of children: Not on file  . Years of education: Not on file  . Highest education level: Not on file  Occupational History  . Not on file  Social Needs  . Financial resource strain: Not on file  . Food insecurity:    Worry: Not on file    Inability: Not on file  . Transportation needs:    Medical: Not on file    Non-medical: Not on file  Tobacco Use  . Smoking status: Never Smoker  . Smokeless tobacco: Never Used  Substance and Sexual Activity  . Alcohol use: No  . Drug use: No  . Sexual  activity: Not on file  Lifestyle  . Physical activity:    Days per week: Not on file    Minutes per session: Not on file  . Stress: Not on file  Relationships  . Social connections:    Talks on phone: Not on file    Gets together: Not on file    Attends religious service: Not on file    Active member of club or organization: Not on file    Attends meetings of clubs or organizations: Not on file    Relationship status: Not on file  . Intimate partner violence:    Fear of current or ex partner: Not on file    Emotionally abused: Not on file    Physically abused: Not on file    Forced sexual activity: Not on file  Other Topics Concern  . Not on file  Social History Narrative  . Not on file     Family History  Problem Relation Age of Onset  . Hypertension Mother   . Hypertension Father      Review of Systems: A 12-system review of systems was performed and is negative except as noted in the HPI.  Labs: Recent Labs    04/07/18 2322  TROPONINI <0.03   Lab Results  Component Value Date   WBC 13.9 (H) 04/08/2018   HGB 10.9 (L) 04/08/2018   HCT 32.9 (L) 04/08/2018   MCV 100.6 (H) 04/08/2018   PLT 345 04/08/2018    Recent Labs  Lab 04/07/18 2322 04/08/18 2233  NA 135 135  K 3.0* 3.6  CL 90* 93*  CO2 36* 31  BUN 7* 6*  CREATININE 0.54 0.48  CALCIUM 8.8* 9.3  PROT 6.2*  --   BILITOT 0.8  --   ALKPHOS 74  --   ALT 13  --   AST 24  --   GLUCOSE 111* 117*   No results found  for: CHOL, HDL, LDLCALC, TRIG No results found for: DDIMER  Radiology/Studies:  Dg Thoracic Spine 2 View  Result Date: 04/05/2018 CLINICAL DATA:  Portable imaging provided for kyphoplasty. EXAM: THORACIC SPINE 2 VIEWS; DG C-ARM 61-120 MIN COMPARISON:  None. FINDINGS: Images show placement of kyphoplasty cement within the T10 vertebral body. Kyphoplasty cement within the T9, T11 and T12 vertebral bodies is stable from previous exams. No evidence of a procedure complication. IMPRESSION:  Imaging provided for T10 kyphoplasty. Electronically Signed   By: Lajean Manes M.D.   On: 04/05/2018 15:05   Dg Thoracic Spine 2 View  Result Date: 03/22/2018 CLINICAL DATA:  Upper and lower back pain. EXAM: THORACIC SPINE 2 VIEWS COMPARISON:  01/05/2018 10/09/2017. FINDINGS: Surgical sutures and clips are noted over the chest bilaterally. Severe cervicothoracic and thoracolumbar spine osteopenia, degenerative change, and scoliosis. Multiple lower thoracic and upper lumbar vertebroplasties. The single remaining lower thoracic vertebra in which prior vertebroplasty has not been performed demonstrates a prominent compression fracture. Thoracic vertebra are difficult to number, this compression fracture appears to be in T10. IMPRESSION: Severe cervicothoracic and thoracolumbar spine osteopenia, degenerative change, and scoliosis. Multiple lower thoracic and upper lumbar vertebroplasties. The remaining lower thoracic vertebral body in which vertebroplasty has not been performed demonstrates a prominent compression fracture. Thoracic spine vertebra difficult to number, this compression fracture appears to be in T10. Electronically Signed   By: Marcello Moores  Register   On: 03/22/2018 07:03   Dg Lumbar Spine Complete W/bend  Result Date: 03/21/2018 CLINICAL DATA:  Upper and lower back pain x 2 days Pt stated she opened her refrigerator door yesterday and felt back pain immediately after H/o kyphoplasty x 4 EXAM: LUMBAR SPINE - COMPLETE WITH BENDING VIEWS COMPARISON:  MR 12/15/2017 FINDINGS: Previous cement augmentation T9, T11, T12, L1, L2, L3. T10 compression fracture deformity with greater than 50% loss of height anteriorly, new since 10/09/2017. Some associated increase in lower thoracic kyphosis across this level. Superior endplate deformities of L4 and L5 are stable compared MR 12/15/2017. Aortic Atherosclerosis (ICD10-170.0). IMPRESSION: T10 compression fracture deformity, new since 10/09/2017. [name] Electronically  Signed   By: Lucrezia Europe M.D.   On: 03/21/2018 20:54   Ct Angio Chest Pe W And/or Wo Contrast  Result Date: 04/08/2018 CLINICAL DATA:  Gastrointestinal upset with pain, nausea and reflux. Back surgery at T10 on Thursday. EXAM: CT ANGIOGRAPHY CHEST CT ABDOMEN AND PELVIS WITH CONTRAST TECHNIQUE: Multidetector CT imaging of the chest was performed using the standard protocol during bolus administration of intravenous contrast. Multiplanar CT image reconstructions and MIPs were obtained to evaluate the vascular anatomy. Multidetector CT imaging of the abdomen and pelvis was performed using the standard protocol during bolus administration of intravenous contrast. CONTRAST:  24mL OMNIPAQUE IOHEXOL 350 MG/ML SOLN COMPARISON:  04/05/2018 intraoperative views of the thoracic spine, thoracic spine radiographs 03/21/2018 FINDINGS: CTA CHEST FINDINGS Cardiovascular: Cardiomegaly without pericardial effusion. Nonaneurysmal ectatic thoracic aorta measuring up to 3.7 cm. No dissection. Satisfactory pulmonary arterial opacification to the segmental level without acute pulmonary embolus. Great vessels are within normal limits. Mediastinum/Nodes: Small mediastinal lymph nodes without pathologic enlargement. Patent trachea and mainstem bronchi. Slight dilatation of the mid and distal esophagus without focal abnormality. The thyroid gland is unremarkable. Lungs/Pleura: Scarring at the apices. Centrilobular emphysema scattered bilateral multilobar bronchiectasis and scattered areas of inspissated mucus within areas of bronchiectasis are noted in the right upper, right middle and both lower lobes. Eventration of the left hemidiaphragm is present. Musculoskeletal: Lower thoracic kyphoplasties from T9  caudad to at least the L1 level on the thoracic spine series. Osteopenic appearance of the thoracic spine. Mild superior endplate compression of T8 and inferior endplate compression of T7. Review of the MIP images confirms the above  findings. CT ABDOMEN and PELVIS FINDINGS Hepatobiliary: Cholecystectomy. Mild reservoir effect accounting for intrahepatic ductal dilatation. Dilatation of the common bile duct at the pancreatic head to 14 mm in diameter without stones. Pancreas: Unremarkable without ductal dilatation, inflammation or mass. Spleen: Normal in size without focal abnormality. Adrenals/Urinary Tract: Normal bilateral adrenal glands. Simple cyst arising off the upper pole the left kidney measuring up to 3.2 cm. No nephrolithiasis nor hydroureteronephrosis. Urinary bladder is distended without focal mural thickening, intraluminal mass or calculus. Stomach/Bowel: There is a small moderate-sized hiatal hernia. The stomach is decompressed in appearance. There is normal small bowel rotation. No small bowel obstruction or inflammation. Moderate stool retention is noted within the colon. Sigmoid diverticulosis without acute diverticulitis is identified. The appendix is not confidently identified but no pericecal inflammation is seen. Vascular/Lymphatic: Moderate aortoiliac atherosclerosis. No aneurysm. No dissection. No lymphadenopathy. Reproductive: Calcified uterine fibroid.  No adnexal mass. Other: No free air or free fluid.  No hernia. Musculoskeletal: T9 through L3 kyphoplasty without complicating features. Chronic superior endplate compressions of L4 and L5 without kyphoplasty cement. Review of the MIP images confirms the above findings. IMPRESSION: Chest CT: 1. Cardiomegaly with ectatic thoracic aorta. No acute pulmonary embolus. 2. Emphysema of the lungs with bronchiectasis. CT AP: 1. Chronic compression deformities of the thoracic and lumbar spine as above with kyphoplasty changes from T9 through L3. 2. Status post cholecystectomy with reservoir effect accounting for intrahepatic and extrahepatic ductal dilatation. No choledocholithiasis. 3. Sigmoid diverticulosis without acute diverticulitis. 4. Moderate sized hiatal hernia.  Eventration of the left hemidiaphragm. No bowel obstruction or inflammation. 5. Simple cyst arising off the upper the left kidney measuring up to 3.2 cm. 6. Calcified uterine fibroid. Electronically Signed   By: Ashley Royalty M.D.   On: 04/08/2018 03:14   Ct Abdomen Pelvis W Contrast  Result Date: 04/08/2018 CLINICAL DATA:  Gastrointestinal upset with pain, nausea and reflux. Back surgery at T10 on Thursday. EXAM: CT ANGIOGRAPHY CHEST CT ABDOMEN AND PELVIS WITH CONTRAST TECHNIQUE: Multidetector CT imaging of the chest was performed using the standard protocol during bolus administration of intravenous contrast. Multiplanar CT image reconstructions and MIPs were obtained to evaluate the vascular anatomy. Multidetector CT imaging of the abdomen and pelvis was performed using the standard protocol during bolus administration of intravenous contrast. CONTRAST:  17mL OMNIPAQUE IOHEXOL 350 MG/ML SOLN COMPARISON:  04/05/2018 intraoperative views of the thoracic spine, thoracic spine radiographs 03/21/2018 FINDINGS: CTA CHEST FINDINGS Cardiovascular: Cardiomegaly without pericardial effusion. Nonaneurysmal ectatic thoracic aorta measuring up to 3.7 cm. No dissection. Satisfactory pulmonary arterial opacification to the segmental level without acute pulmonary embolus. Great vessels are within normal limits. Mediastinum/Nodes: Small mediastinal lymph nodes without pathologic enlargement. Patent trachea and mainstem bronchi. Slight dilatation of the mid and distal esophagus without focal abnormality. The thyroid gland is unremarkable. Lungs/Pleura: Scarring at the apices. Centrilobular emphysema scattered bilateral multilobar bronchiectasis and scattered areas of inspissated mucus within areas of bronchiectasis are noted in the right upper, right middle and both lower lobes. Eventration of the left hemidiaphragm is present. Musculoskeletal: Lower thoracic kyphoplasties from T9 caudad to at least the L1 level on the thoracic  spine series. Osteopenic appearance of the thoracic spine. Mild superior endplate compression of T8 and inferior endplate compression of T7. Review  of the MIP images confirms the above findings. CT ABDOMEN and PELVIS FINDINGS Hepatobiliary: Cholecystectomy. Mild reservoir effect accounting for intrahepatic ductal dilatation. Dilatation of the common bile duct at the pancreatic head to 14 mm in diameter without stones. Pancreas: Unremarkable without ductal dilatation, inflammation or mass. Spleen: Normal in size without focal abnormality. Adrenals/Urinary Tract: Normal bilateral adrenal glands. Simple cyst arising off the upper pole the left kidney measuring up to 3.2 cm. No nephrolithiasis nor hydroureteronephrosis. Urinary bladder is distended without focal mural thickening, intraluminal mass or calculus. Stomach/Bowel: There is a small moderate-sized hiatal hernia. The stomach is decompressed in appearance. There is normal small bowel rotation. No small bowel obstruction or inflammation. Moderate stool retention is noted within the colon. Sigmoid diverticulosis without acute diverticulitis is identified. The appendix is not confidently identified but no pericecal inflammation is seen. Vascular/Lymphatic: Moderate aortoiliac atherosclerosis. No aneurysm. No dissection. No lymphadenopathy. Reproductive: Calcified uterine fibroid.  No adnexal mass. Other: No free air or free fluid.  No hernia. Musculoskeletal: T9 through L3 kyphoplasty without complicating features. Chronic superior endplate compressions of L4 and L5 without kyphoplasty cement. Review of the MIP images confirms the above findings. IMPRESSION: Chest CT: 1. Cardiomegaly with ectatic thoracic aorta. No acute pulmonary embolus. 2. Emphysema of the lungs with bronchiectasis. CT AP: 1. Chronic compression deformities of the thoracic and lumbar spine as above with kyphoplasty changes from T9 through L3. 2. Status post cholecystectomy with reservoir effect  accounting for intrahepatic and extrahepatic ductal dilatation. No choledocholithiasis. 3. Sigmoid diverticulosis without acute diverticulitis. 4. Moderate sized hiatal hernia. Eventration of the left hemidiaphragm. No bowel obstruction or inflammation. 5. Simple cyst arising off the upper the left kidney measuring up to 3.2 cm. 6. Calcified uterine fibroid. Electronically Signed   By: Ashley Royalty M.D.   On: 04/08/2018 03:14   Ct T-spine No Charge  Result Date: 04/08/2018 CLINICAL DATA:  Chest pain. Status post multiple kyphoplasties, most recent April 05, 2018. EXAM: CT THORACIC SPINE WITHOUT CONTRAST TECHNIQUE: Reformatted CT images of the thoracic were obtained using the standard protocol without intravenous contrast. COMPARISON:  Thoracolumbar spine radiographs March 21 2018 FINDINGS: ALIGNMENT: Maintained thoracic kyphosis. No malalignment. VERTEBRAE: Status post T9 through L1 vertebral body cement augmentation, new at T10. Mild T1, T5, T7 and moderate T8 compression fractures, new at T7. Limited comparison of small upper thoracic compression fractures on prior radiographs. Osteopenia. No destructive bony lesions. PARASPINAL AND OTHER SOFT TISSUES: Please see CT of chest from same day, reported separately. DISC LEVELS: No canal stenosis. Severe LEFT T9-10, moderate T10-11 and T11-12 neural foraminal narrowing. IMPRESSION: 1. Status post T9 through L1 vertebral bodies cement augmentation. 2. Mild T1, T5, T7 and moderate T8 compression fractures, new at T7. 3. Osteopenia. Electronically Signed   By: Elon Alas M.D.   On: 04/08/2018 03:24   Dg C-arm 1-60 Min  Result Date: 04/05/2018 CLINICAL DATA:  Portable imaging provided for kyphoplasty. EXAM: THORACIC SPINE 2 VIEWS; DG C-ARM 61-120 MIN COMPARISON:  None. FINDINGS: Images show placement of kyphoplasty cement within the T10 vertebral body. Kyphoplasty cement within the T9, T11 and T12 vertebral bodies is stable from previous exams. No evidence of  a procedure complication. IMPRESSION: Imaging provided for T10 kyphoplasty. Electronically Signed   By: Lajean Manes M.D.   On: 04/05/2018 15:05   Dg C-arm 1-60 Min-no Report  Result Date: 03/29/2018 Fluoroscopy was utilized by the requesting physician.  No radiographic interpretation.    Wt Readings from Last 3 Encounters:  04/08/18 47.3 kg  04/05/18 48.5 kg  04/04/18 48.5 kg    EKG: Atrial fibrillation with variable ventricular response  Physical Exam:  Blood pressure 105/81, pulse 74, temperature 98 F (36.7 C), resp. rate (!) 28, height 5' (1.524 m), weight 47.3 kg, SpO2 100 %. Body mass index is 20.37 kg/m. General: Well developed, well nourished, in no acute distress. Head: Normocephalic, atraumatic, sclera non-icteric, no xanthomas, nares are without discharge.  Neck: Negative for carotid bruits. JVD not elevated. Lungs: Clear bilaterally to auscultation without wheezes, rales, or rhonchi. Breathing is unlabored. Heart: Regular regular rhythm 1/6 systolic murmur radiating to the outflow tract Abdomen: Soft, non-tender, non-distended with normoactive bowel sounds. No hepatomegaly. No rebound/guarding. No obvious abdominal masses. Msk:  Strength and tone appear normal for age. Extremities: No clubbing or cyanosis. No edema.  Distal pedal pulses are 2+ and equal bilaterally. Neuro: Alert and oriented X 3. No facial asymmetry. No focal deficit. Moves all extremities spontaneously. Psych:  Responds to questions appropriately with a normal affect.     Assessment and Plan  73 year old female with history of type Takotsubo syndrome, ejection fraction more recently has been normal.  She developed A. fib with rapid ventricular response post kyphoplasty.  This was in the face of nausea and vomiting for 3 days and relative hypokalemia.  She currently is in A. fib with controlled rate on a Cardizem drip.  Will discontinue Cardizem drip and continue with p.o. Cardizem for breakthroughs  however will rate control with metoprolol as she is doing as an outpatient.  Would continue to anticoagulate with apixaban.  Would transfer to telemetry and follow for breakthrough rapid ventricular response.  Signed, Teodoro Spray MD 04/09/2018, 7:59 AM Pager: [phone]

## 2018-04-09 NOTE — Plan of Care (Signed)

## 2018-04-10 ENCOUNTER — Inpatient Hospital Stay: Payer: Medicare Other

## 2018-04-10 LAB — BASIC METABOLIC PANEL
Anion gap: 6 (ref 5–15)
BUN: 7 mg/dL — ABNORMAL LOW (ref 8–23)
CALCIUM: 8.7 mg/dL — AB (ref 8.9–10.3)
CO2: 35 mmol/L — ABNORMAL HIGH (ref 22–32)
Chloride: 95 mmol/L — ABNORMAL LOW (ref 98–111)
Creatinine, Ser: 0.51 mg/dL (ref 0.44–1.00)
GFR calc Af Amer: >60 mL/min (ref >60)
Glucose, Bld: 92 mg/dL (ref 70–99)
Potassium: 3 mmol/L — ABNORMAL LOW (ref 3.5–5.1)
Sodium: 136 mmol/L (ref 135–145)

## 2018-04-10 LAB — MAGNESIUM: Magnesium: 2.1 mg/dL (ref 1.7–2.4)

## 2018-04-10 MED ORDER — HYDROMORPHONE HCL 1 MG/ML IJ SOLN
0.5000 mg | INTRAMUSCULAR | Status: DC | PRN
  Administered 2018-04-10 – 2018-04-12 (×11): 0.5 mg via INTRAVENOUS
  Filled 2018-04-10 (×12): qty 1

## 2018-04-10 MED ORDER — POTASSIUM CHLORIDE CRYS ER 20 MEQ PO TBCR
30.0000 meq | EXTENDED_RELEASE_TABLET | ORAL | Status: AC
  Administered 2018-04-10 (×2): 30 meq via ORAL
  Filled 2018-04-10 (×2): qty 1

## 2018-04-10 MED ORDER — DILTIAZEM HCL 30 MG PO TABS
60.0000 mg | ORAL_TABLET | Freq: Two times a day (BID) | ORAL | Status: DC
  Administered 2018-04-10 – 2018-04-14 (×8): 60 mg via ORAL
  Filled 2018-04-10 (×9): qty 2

## 2018-04-10 NOTE — Progress Notes (Addendum)
Patient is seen because of severe back pain.  She underwent a kyphoplasty at the T10 on 3/5 and has not had relief.  She is normally not a complainer but she is in intense pain and I recommend MRI of the thoracic spine to see if there is an occult fracture that did not show up on her prior x-rays.  This MRI has been ordered thank you   I talked with the patient about her MRI results which are now back and they show a new T7 and T8 compression fracture.  With the amount of pain she is having I would recommend kyphoplasty at those levels but will need to wait till Thursday with her off Eliquis for 48 hours.

## 2018-04-10 NOTE — Care Management Note (Signed)
Case Management Note  Patient Details  Name: ROCQUEL ASKREN MRN: [id number] Date of Birth: 1945/07/08  Subjective/Objective:                 Confirmed that patient is followed by [facility].  Patient initially in observation but admitted 3/8 but admitted due to atrial fib requiring cardizem drip and transfer to stepdown. Transferred out to 1c 3/10. Complaining of severe back pain and found to have new compression fractures T8 and T7.  Just had kyphoplasty on T10 on 3/5.  Anticipate another kyphoplasty on 3/12 after Eliquis is held for 48 hours   Action/Plan:    Expected Discharge Date:                  Expected Discharge Plan:     In-House Referral:     Discharge planning Services     Post Acute Care Choice:    Choice offered to:     DME Arranged:    DME Agency:     HH Arranged:    HH Agency:     Status of Service:     If discussed at [employer], dates discussed:    Additional Comments:  Katrina Stack, RN 04/10/2018, 4:54 PM

## 2018-04-10 NOTE — Progress Notes (Signed)
Pharmacy Electrolyte Monitoring Consult:  Pharmacy consulted to assist in monitoring and replacing electrolytes in this 73 y.o. female admitted on 04/07/2018 with Abdominal Pain and Nausea   Labs:  Sodium (mmol/L)  Date Value  04/10/2018 136  03/01/2018 138   Potassium (mmol/L)  Date Value  04/10/2018 3.0 (L)   Magnesium (mg/dL)  Date Value  04/10/2018 2.1   Phosphorus (mg/dL)  Date Value  04/09/2018 3.0   Calcium (mg/dL)  Date Value  04/10/2018 8.7 (L)   Albumin (g/dL)  Date Value  04/07/2018 3.5  03/01/2018 3.8    Assessment/Plan: 3/10 K: 3.0. Patient has KCL 50mEq QD ordered.   Will order an additional KCL 99mEq x 2 doses.   Will continue to replace for goal potassium ~ 4 and goal magnesium ~ 2.   Plan to F/U with AM labs and continue to monitor and adjust per consult.   Pernell Dupre, PharmD, BCPS Clinical Pharmacist 04/10/2018 9:15 AM

## 2018-04-10 NOTE — Progress Notes (Signed)
   04/10/18 1100  Clinical Encounter Type  Visited With Patient;Other (Comment)  Visit Type Initial  Spiritual Encounters  Spiritual Needs Prayer;Emotional  Stress Factors  Patient Stress Factors Health changes  Pt asked for a prayer. Ch prayed with her. A friend was at bedside.

## 2018-04-10 NOTE — Progress Notes (Signed)
PT Cancellation Note  Patient Details Name: Ana Washington MRN: [id number] DOB: April 23, 1945   Cancelled Treatment:    Reason Eval/Treat Not Completed: Pain limiting ability to participate;Medical issues which prohibited therapy(patient has Stat MRI spine order  ) Will evaluate once MRI done and ortho determines plan.     Viyaan Champine 04/10/2018, 1:36 PM

## 2018-04-10 NOTE — Progress Notes (Signed)
[place] [facility] [place] NAME: Shakara Tweedy    MR#:  [id number]  DATE OF BIRTH:  12-17-1945  SUBJECTIVE:   Patient continues to complain of some back pain.  Heart rates are better controlled and patient has been transferred to ICU and off Cardizem drip. Very anxious.   REVIEW OF SYSTEMS:    Review of Systems  Constitutional: Negative for chills and fever.  HENT: Negative for congestion and tinnitus.   Eyes: Negative for blurred vision and double vision.  Respiratory: Negative for cough, shortness of breath and wheezing.   Cardiovascular: Negative for chest pain, orthopnea and PND.  Gastrointestinal: Negative for abdominal pain, diarrhea, nausea and vomiting.  Genitourinary: Negative for dysuria and hematuria.  Musculoskeletal: Positive for back pain.  Neurological: Negative for dizziness, sensory change and focal weakness.  Psychiatric/Behavioral: The patient is nervous/anxious.   All other systems reviewed and are negative.   Nutrition: Heart Healthy Tolerating Diet: Yes Tolerating PT: Await Eval.   DRUG ALLERGIES:   Allergies  Allergen Reactions  . Sulfa Antibiotics Diarrhea  . Codeine Nausea And Vomiting  . Penicillins Rash    Has patient had a PCN reaction causing immediate rash, facial/tongue/throat swelling, SOB or lightheadedness with hypotension: Unknown Has patient had a PCN reaction causing severe rash involving mucus membranes or skin necrosis: Unknown Has patient had a PCN reaction that required hospitalization: Unknown Has patient had a PCN reaction occurring within the last 10 years: No If all of the above answers are "NO", then may proceed with Cephalosporin use.     VITALS:  Blood pressure (!) 152/99, pulse 90, temperature 97.6 F (36.4 C), temperature source Oral, resp. rate 16, height 5' (1.524 m), weight 50.5 kg, SpO2 98 %.  PHYSICAL EXAMINATION:   Physical Exam  GENERAL:  73 y.o.-year-old thin patient  lying in bed anxious and in mild distress due to back pain. [name]  EYES: Pupils equal, round, reactive to light and accommodation. No scleral icterus. Extraocular muscles intact.  HEENT: Head atraumatic, normocephalic. Oropharynx and nasopharynx clear.  NECK:  Supple, no jugular venous distention. No thyroid enlargement, no tenderness.  LUNGS: Normal breath sounds bilaterally, no wheezing, + dry rales b/l, no rhonchi. + use of accessory muscles of respiration.  CARDIOVASCULAR: S1, S2 Irregular. No murmurs, rubs, or gallops.  ABDOMEN: Soft, nontender, nondistended. Bowel sounds present. No organomegaly or mass.  EXTREMITIES: No cyanosis, clubbing or edema b/l.    NEUROLOGIC: Cranial nerves II through XII are intact. No focal Motor or sensory deficits b/l.   PSYCHIATRIC: The patient is alert and oriented x 3.  SKIN: No obvious rash, lesion, or ulcer.    LABORATORY PANEL:   CBC Recent Labs  Lab 04/09/18 1244  WBC 19.5*  HGB 11.3*  HCT 34.1*  PLT 352   ------------------------------------------------------------------------------------------------------------------  Chemistries  Recent Labs  Lab 04/07/18 2322  04/10/18 0404  NA 135   < > 136  K 3.0*   < > 3.0*  CL 90*   < > 95*  CO2 36*   < > 35*  GLUCOSE 111*   < > 92  BUN 7*   < > 7*  CREATININE 0.54   < > 0.51  CALCIUM 8.8*   < > 8.7*  MG 2.0   < > 2.1  AST 24  --   --   ALT 13  --   --   ALKPHOS 74  --   --  BILITOT 0.8  --   --    < > = values in this interval not displayed.   ------------------------------------------------------------------------------------------------------------------  Cardiac Enzymes Recent Labs  Lab 04/07/18 2322  TROPONINI <0.03   ------------------------------------------------------------------------------------------------------------------  RADIOLOGY:  No results found.   ASSESSMENT AND PLAN:   73 year old female with past medical history of chronic respiratory failure secondary  to bronchiectasis, back pain secondary to compression fracture status post recent kyphoplasty, history of peptic ulcer disease, hypertension, GERD, coronary disease, atrial fibrillation who presents to the hospital due to intractable nausea vomiting and abdominal pain.  1.  Intractable nausea vomiting abdominal pain-etiology unclear, patient CT abdomen pelvis was negative for acute pathology.  Patient LFTs are stable lipase is normal.  Suspect this is likely gastritis/GERD.   - continue PPI, Maalox and supportive care.   2.  Back pain- patient is status post recent kyphoplasty a few days back.  She continues to complain of significant back pain.  Called Dr. Rudene Christians and he will see patient in consultation as he did his surgery.  Continue supportive care with pain control with IV Dilaudid ordered.  3.  Atrial fibrillation with rapid ventricular response- much improved and off Cardizem drip. -Continue oral Cardizem, Toprol.  Appreciate cardiology input.   -Continue Eliquis.  4.  Shortness of breath- patient has chronic shortness of breath due to her bronchiectasis.  She is not significantly hypoxic.  Continue supportive care O2 supplementation for now.    5.  Hypokalemia-secondary to the nausea vomiting. - cont. To supplement and will repeat level in a.m.   6.  Essential hypertension-continue losartan, Toprol, Norvasc.  7.  GERD- continue Protonix, Carafate    All the records are reviewed and case discussed with Care Management/Social Worker. Management plans discussed with the patient, family and they are in agreement.  CODE STATUS: Full code  DVT Prophylaxis: Eliquis  TOTAL TIME TAKING CARE OF THIS PATIENT: 30 minutes.   POSSIBLE D/C IN 2-3 DAYS, DEPENDING ON CLINICAL CONDITION.   Henreitta Leber M.D on 04/10/2018 at 2:25 PM  Between 7am to 6pm - Pager - [phone]  After 6pm go to [email or web address] - Proofreader  [facility]  Office   [phone]  CC: Primary care physician; [name], MD

## 2018-04-11 LAB — BASIC METABOLIC PANEL
Anion gap: 9 (ref 5–15)
BUN: 9 mg/dL (ref 8–23)
CO2: 32 mmol/L (ref 22–32)
Calcium: 9.6 mg/dL (ref 8.9–10.3)
Chloride: 94 mmol/L — ABNORMAL LOW (ref 98–111)
Creatinine, Ser: 0.61 mg/dL (ref 0.44–1.00)
GFR calc Af Amer: >60 mL/min (ref >60)
GFR calc non Af Amer: >60 mL/min (ref >60)
Glucose, Bld: 83 mg/dL (ref 70–99)
Potassium: 4.1 mmol/L (ref 3.5–5.1)
Sodium: 135 mmol/L (ref 135–145)

## 2018-04-11 LAB — MAGNESIUM: Magnesium: 2.1 mg/dL (ref 1.7–2.4)

## 2018-04-11 MED ORDER — ORAL CARE MOUTH RINSE
15.0000 mL | Freq: Two times a day (BID) | OROMUCOSAL | Status: DC
  Administered 2018-04-12 – 2018-04-14 (×3): 15 mL via OROMUCOSAL

## 2018-04-11 MED ORDER — AMLODIPINE BESYLATE 5 MG PO TABS
5.0000 mg | ORAL_TABLET | Freq: Every day | ORAL | Status: DC
  Administered 2018-04-11 – 2018-04-14 (×4): 5 mg via ORAL
  Filled 2018-04-11 (×4): qty 1

## 2018-04-11 MED ORDER — POLYETHYLENE GLYCOL 3350 17 G PO PACK
17.0000 g | PACK | Freq: Every day | ORAL | Status: DC
  Administered 2018-04-11 – 2018-04-13 (×3): 17 g via ORAL
  Filled 2018-04-11 (×4): qty 1

## 2018-04-11 MED ORDER — CLINDAMYCIN PHOSPHATE 600 MG/50ML IV SOLN
600.0000 mg | Freq: Once | INTRAVENOUS | Status: AC
  Administered 2018-04-12: 600 mg via INTRAVENOUS
  Filled 2018-04-11: qty 50

## 2018-04-11 NOTE — Progress Notes (Signed)
Pharmacy Electrolyte Monitoring Consult:  Pharmacy consulted to assist in monitoring and replacing electrolytes in this 73 y.o. female admitted on 04/07/2018 with Abdominal Pain and Nausea   Labs:  Sodium (mmol/L)  Date Value  04/11/2018 135  03/01/2018 138   Potassium (mmol/L)  Date Value  04/11/2018 4.1   Magnesium (mg/dL)  Date Value  04/11/2018 2.1   Phosphorus (mg/dL)  Date Value  04/09/2018 3.0   Calcium (mg/dL)  Date Value  04/11/2018 9.6   Albumin (g/dL)  Date Value  04/07/2018 3.5  03/01/2018 3.8    Assessment/Plan: 3/11 K: 4.1. Patient has KCL 13mEq QD ordered.   No additional replacement needed at this time.   Will continue to replace for goal potassium ~ 4 and goal magnesium ~ 2.   Plan to F/U with AM labs and continue to monitor and adjust per consult.   Pernell Dupre, PharmD, BCPS Clinical Pharmacist 04/11/2018 7:08 AM

## 2018-04-11 NOTE — TOC Initial Note (Signed)
Transition of Care [facility]) - Initial/Assessment Note    Patient Details  Name: Ana Washington MRN: [id number] Date of Birth: 11-18-1945  Transition of Care [facility] CM/SW Contact:    Candie Chroman, LCSW Phone Number: 04/11/2018, 12:28 PM  Clinical Narrative: CSW met with patient. Sister-in-law at bedside. CSW introduced role and explained that PT recommendations would be discussed. Patient does not really want to SNF placement but knows she needs it at this time. No facility preference. Patient currently at home with hospice and is aware she will have to revoke her hospice benefit to go to rehab. She will reinstate once she is discharged home from the SNF. Her husband will be having surgery soon and will probably have to go to a rehab facility as well. Provided list of CMS Medicare scores for facilities within 25 miles of her zip code. No further concerns. CSW encouraged patient and her sister-in-law to contact CSW as needed. CSW will continue to follow patient and her sister-in-law for support and facilitate discharge to SNF once medically stable.                  Expected Discharge Plan: Skilled Nursing Facility Barriers to Discharge: Ship broker, Continued Medical Work up, SNF Pending bed offer   Patient Goals and CMS Choice Patient states their goals for this hospitalization and ongoing recovery are:: " To be able to go to the kitchen and make a sandwich. To be able to take a shower by myself." CMS Medicare.gov Compare Post Acute Care list provided to:: Patient(Sister-in-law at bedside.)    Expected Discharge Plan and Services Expected Discharge Plan: [place] Choice: [place] arrangements for the past 2 months: Single Family Home                          Prior Living Arrangements/Services Living arrangements for the past 2 months: Single Family Home Lives with:: Spouse Patient language and need for interpreter  reviewed:: No Do you feel safe going back to the place where you live?: Yes      Need for Family Participation in Patient Care: Yes (Comment)     Criminal Activity/Legal Involvement Pertinent to Current Situation/Hospitalization: No - Comment as needed  Activities of Daily Living Home Assistive Devices/Equipment: Shower chair with back, Nebulizer, Environmental consultant (specify type) ADL Screening (condition at time of admission) Patient's cognitive ability adequate to safely complete daily activities?: Yes Is the patient deaf or have difficulty hearing?: No Does the patient have difficulty seeing, even when wearing glasses/contacts?: No Does the patient have difficulty concentrating, remembering, or making decisions?: Yes Patient able to express need for assistance with ADLs?: Yes Does the patient have difficulty dressing or bathing?: Yes Independently performs ADLs?: Yes (appropriate for developmental age) Does the patient have difficulty walking or climbing stairs?: Yes Weakness of Legs: Both Weakness of Arms/Hands: Both  Permission Sought/Granted Permission sought to share information with : Facility Sport and exercise psychologist, Family Supports Permission granted to share information with : Yes, Verbal Permission Granted     Permission granted to share info w AGENCY: SNF's  Permission granted to share info w Relationship: Sister-in-law at bedside     Emotional Assessment Appearance:: Appears stated age Attitude/Demeanor/Rapport: Engaged, Gracious Affect (typically observed): Accepting, Appropriate, Calm, Pleasant Orientation: : Oriented to Self, Oriented to Place, Oriented to  Time, Oriented to Situation Alcohol / Substance Use: Never Used Psych Involvement: No (comment)  Admission  diagnosis:  Hiatal hernia [K44.9] Generalized abdominal pain [R10.84] Post-operative pain [G89.18] Intractable nausea and vomiting [R11.2] Patient Active Problem List   Diagnosis Date Noted  . Intractable nausea  and vomiting 04/08/2018  . Atrial fibrillation with RVR ([place]) 04/08/2018  . Thoracic radiculitis (Bilateral) 03/29/2018  . Vasovagal episode 03/29/2018  . Closed compression fracture of T10 thoracic vertebra, sequela 03/29/2018  . Abnormal MRI, lumbar spine (12/15/2016) 03/21/2018  . Lumbar compression fractures, sequela (L1, L2, L3, L4, and L5) 03/21/2018  . Closed compression fracture of L1 lumbar vertebra, sequela 03/21/2018  . Closed compression fracture of L2 lumbar vertebra, sequela 03/21/2018  . Closed compression fracture of L3 lumbar vertebra, sequela 03/21/2018  . Closed compression fracture of L4 lumbar vertebra, sequela 03/21/2018  . Closed compression fracture of L5 lumbar vertebra, sequela 03/21/2018  . Thoracic compression fracture, sequela (T5, T9, T10, T11, and T12) 03/21/2018  . Closed compression fracture of T5 thoracic vertebra, sequela 03/21/2018  . Closed compression fracture of T9 thoracic vertebra, sequela 03/21/2018  . Close compression fracture of T11 thoracic vertebra, sequela 03/21/2018  . Closed compression fracture of T12 thoracic vertebra, sequela 03/21/2018  . Lumbar facet hypertrophy 03/21/2018  . Grade 1  Lumbar Anterolisthesis of L3/4 and L4/5 03/21/2018  . Lumbar central spinal stenosis (Multilevel), 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 claudication 03/21/2018  . Chronic anticoagulation (ELIQUIS) 03/21/2018  . History of pelvic fracture 03/21/2018  . Chronic musculoskeletal pain 03/21/2018  . Neurogenic pain 03/21/2018  . Long term prescription benzodiazepine use 03/21/2018  . 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 Adult bronchiectasis ([place]) 03/01/2018  . Diverticulitis 03/01/2018  . 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[place]) 03/01/2018  . Migraines 03/01/2018  . 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 Psoriasis 03/01/2018  . Chronic upper back pain (Primary Area of Pain) (Bilateral) (R>L) 03/01/2018  .  Chronic low back pain (Secondary Area of Pain) (Bilateral) (R>L) w/o sciatica 03/01/2018  . Chronic pain syndrome 03/01/2018  . Long term current use of opiate analgesic 03/01/2018  . Pharmacologic therapy 03/01/2018  . Disorder of skeletal system 03/01/2018  . Problems influencing health status 03/01/2018  . History of kyphoplasty (L1, L2, L3, T9, T11, and T12) 01/05/2018  . Protein-calorie malnutrition, severe 08/03/2017  . GERD (gastroesophageal reflux disease) 07/30/2017  . Pelvic fracture ([place]) 07/30/2017  . Paroxysmal atrial fibrillation ([place]) 07/30/2017  . Other dysphagia 09/13/2016  . Unintended weight loss 09/13/2016  . Essential hypertension 10/03/2013  . Osteoarthritis of knees (Bilateral) 10/03/2013  . Primary localized osteoarthrosis, lower leg 10/03/2013  . Non-ischemic cardiomyopathy ([place]) 09/12/2013  . Coronary artery disease involving native coronary artery of native heart without angina pectoris 07/28/2012  . Hyperlipidemia, mixed 07/28/2012  . DDD (degenerative disc disease), lumbar 02/29/2012   PCP:  [name], MD Pharmacy:   [facility] [place], [place] [address] [place] [address] Phone: [phone] Fax: [phone]     Social Determinants of Health (SDOH) Interventions    Readmission Risk Interventions 30 Day Unplanned Readmission Risk Score     ED to Hosp-Admission (Current) from 04/07/2018 in [place] (1C)  30 Day Unplanned Readmission Risk Score (%)  20 Filed at 04/11/2018 1200     This score is the patient's risk of an unplanned readmission within 30 days of being discharged (0 -100%). The score is based on dignosis, age, lab data, medications, orders, and past  utilization.   Low:  0-14.9   Medium: 15-21.9   High: 22-29.9   Extreme: 30 and above       Readmission Risk Prevention Plan 01/06/2018   Transportation Screening Complete  PCP or Specialist Appt within 5-7 Days Complete  Home Care Screening Complete  Medication Review (RN CM) Complete  Some recent data might be hidden

## 2018-04-11 NOTE — Progress Notes (Signed)
Patient still in severe pain. Plan kypho at T 7+8 tomorrow

## 2018-04-11 NOTE — NC FL2 (Signed)
[place] LEVEL OF CARE SCREENING TOOL     IDENTIFICATION  Patient Name: Ana Washington Birthdate: 11-15-1945 Sex: female Admission Date (Current Location): 04/07/2018  [place] and [place] Number:  Engineering geologist and Address:  [facility], [address] [place], [place] [address]      Provider Number: [id number]  Attending Physician Name and Address:  Demetrios Loll, MD  Relative Name and Phone Number:       Current Level of Care: Hospital Recommended Level of Care: [place] Prior Approval Number:    Date Approved/Denied:   PASRR Number: [id number]  Discharge Plan: SNF    Current Diagnoses: Patient Active Problem List   Diagnosis Date Noted  . Intractable nausea and vomiting 04/08/2018  . Atrial fibrillation with RVR ([place]) 04/08/2018  . Thoracic radiculitis (Bilateral) 03/29/2018  . Vasovagal episode 03/29/2018  . Closed compression fracture of T10 thoracic vertebra, sequela 03/29/2018  . Abnormal MRI, lumbar spine (12/15/2016) 03/21/2018  . Lumbar compression fractures, sequela (L1, L2, L3, L4, and L5) 03/21/2018  . Closed compression fracture of L1 lumbar vertebra, sequela 03/21/2018  . Closed compression fracture of L2 lumbar vertebra, sequela 03/21/2018  . Closed compression fracture of L3 lumbar vertebra, sequela 03/21/2018  . Closed compression fracture of L4 lumbar vertebra, sequela 03/21/2018  . Closed compression fracture of L5 lumbar vertebra, sequela 03/21/2018  . Thoracic compression fracture, sequela (T5, T9, T10, T11, and T12) 03/21/2018  . Closed compression fracture of T5 thoracic vertebra, sequela 03/21/2018  . Closed compression fracture of T9 thoracic vertebra, sequela 03/21/2018  . Close compression fracture of T11 thoracic vertebra, sequela 03/21/2018  . Closed compression fracture of T12 thoracic vertebra, sequela 03/21/2018  . Lumbar facet hypertrophy 03/21/2018  . Grade 1  Lumbar  Anterolisthesis of L3/4 and L4/5 03/21/2018  . Lumbar central spinal stenosis (Multilevel), 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 claudication 03/21/2018  . Chronic anticoagulation (ELIQUIS) 03/21/2018  . History of pelvic fracture 03/21/2018  . Chronic musculoskeletal pain 03/21/2018  . Neurogenic pain 03/21/2018  . Long term prescription benzodiazepine use 03/21/2018  . 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 Adult bronchiectasis ([place]) 03/01/2018  . Diverticulitis 03/01/2018  . 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[place]) 03/01/2018  . Migraines 03/01/2018  . 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 Psoriasis 03/01/2018  . Chronic upper back pain (Primary Area of Pain) (Bilateral) (R>L) 03/01/2018  . Chronic low back pain (Secondary Area of Pain) (Bilateral) (R>L) w/o sciatica 03/01/2018  . Chronic pain syndrome 03/01/2018  . Long term current use of opiate analgesic 03/01/2018  . Pharmacologic therapy 03/01/2018  . Disorder of skeletal system 03/01/2018  . Problems influencing health status 03/01/2018  . History of kyphoplasty (L1, L2, L3, T9, T11, and T12) 01/05/2018  . Protein-calorie malnutrition, severe 08/03/2017  . GERD (gastroesophageal reflux disease) 07/30/2017  . Pelvic fracture ([place]) 07/30/2017  . Paroxysmal atrial fibrillation ([place]) 07/30/2017  . Other dysphagia 09/13/2016  . Unintended weight loss 09/13/2016  . Essential hypertension 10/03/2013  . Osteoarthritis of knees (Bilateral) 10/03/2013  . Primary localized osteoarthrosis, lower leg 10/03/2013  . Non-ischemic cardiomyopathy ([place]) 09/12/2013  . Coronary artery disease involving native coronary artery of native heart without angina pectoris 07/28/2012  . Hyperlipidemia, mixed 07/28/2012  . DDD (degenerative disc disease), lumbar 02/29/2012    Orientation RESPIRATION BLADDER Height & Weight     Self, Time, Situation, Place  O2( 2L) Continent, External catheter Weight:  49.9 kg Height:  5' (  152.4 cm)  BEHAVIORAL SYMPTOMS/MOOD NEUROLOGICAL BOWEL NUTRITION STATUS  (none)   Continent Diet(heart healthy)  AMBULATORY STATUS COMMUNICATION OF NEEDS Skin   Extensive Assist Verbally Surgical wounds, Bruising                       Personal Care Assistance Level of Assistance  Bathing, Feeding, Dressing Bathing Assistance: Limited assistance Feeding assistance: Limited assistance Dressing Assistance: Limited assistance     Functional Limitations Info  Sight, Hearing, Speech Sight Info: Adequate Hearing Info: Adequate Speech Info: Adequate    SPECIAL CARE FACTORS FREQUENCY  PT (By licensed PT), OT (By licensed OT)     PT Frequency: 5x per week OT Frequency: 5x per week            Contractures Contractures Info: Not present    Additional Factors Info  Code Status, Allergies Code Status Info: DNR Allergies Info: Sulfa drugs, penicillins and codeine           Current Medications (04/11/2018):  This is the current hospital active medication list Current Facility-Administered Medications  Medication Dose Route Frequency Provider Last Rate Last Dose  . acetaminophen (TYLENOL) tablet 650 mg  650 mg Oral Q6H PRN Harrie Foreman, MD   325 mg at 04/09/18 1944   Or  . acetaminophen (TYLENOL) suppository 650 mg  650 mg Rectal Q6H PRN Harrie Foreman, MD      . acidophilus (RISAQUAD) capsule 1 capsule  1 capsule Oral Daily Harrie Foreman, MD   1 capsule at 04/11/18 0949  . alum & mag hydroxide-simeth (MAALOX/MYLANTA) 200-200-20 MG/5ML suspension 30 mL  30 mL Oral Q6H PRN Henreitta Leber, MD   30 mL at 04/09/18 1312  . amLODipine (NORVASC) tablet 5 mg  5 mg Oral Daily Demetrios Loll, MD   5 mg at 04/11/18 1207  . aspirin EC tablet 81 mg  81 mg Oral Daily Harrie Foreman, MD   81 mg at 04/11/18 0948  . diltiazem (CARDIZEM) tablet 60 mg  60 mg Oral Q12H Henreitta Leber, MD   60 mg at 04/11/18 0949  . docusate sodium (COLACE) capsule 100 mg   100 mg Oral BID Harrie Foreman, MD   100 mg at 04/11/18 0949  . HYDROmorphone (DILAUDID) injection 0.5 mg  0.5 mg Intravenous Q3H PRN Henreitta Leber, MD   0.5 mg at 04/11/18 1206  . LORazepam (ATIVAN) tablet 0.5 mg  0.5 mg Oral Q4H PRN Harrie Foreman, MD   0.5 mg at 04/11/18 1058  . losartan (COZAAR) tablet 100 mg  100 mg Oral Daily Harrie Foreman, MD   100 mg at 04/11/18 0949  . metoprolol succinate (TOPROL-XL) 24 hr tablet 50 mg  50 mg Oral QHS Harrie Foreman, MD   50 mg at 04/10/18 2317  . ondansetron (ZOFRAN) tablet 4 mg  4 mg Oral Q6H PRN Harrie Foreman, MD       Or  . ondansetron [facility]) injection 4 mg  4 mg Intravenous Q6H PRN Harrie Foreman, MD      . pantoprazole (PROTONIX) EC tablet 40 mg  40 mg Oral Daily Wilhelmina Mcardle, MD   40 mg at 04/11/18 0948  . polyethylene glycol (MIRALAX / GLYCOLAX) packet 17 g  17 g Oral Daily Demetrios Loll, MD   17 g at 04/11/18 1206  . potassium chloride (K-DUR,KLOR-CON) CR tablet 10 mEq  10 mEq Oral Daily Harrie Foreman, MD  10 mEq at 04/11/18 0949  . predniSONE (DELTASONE) tablet 10 mg  10 mg Oral Q breakfast Harrie Foreman, MD   10 mg at 04/11/18 0949  . prochlorperazine (COMPAZINE) injection 10 mg  10 mg Intravenous Q4H PRN Harrie Foreman, MD      . sucralfate (CARAFATE) tablet 1 g  1 g Oral TID WC & HS Harrie Foreman, MD   1 g at 04/11/18 1206  . traMADol (ULTRAM) tablet 50 mg  50 mg Oral Q6H PRN Lance Coon, MD   50 mg at 04/10/18 1200  . vitamin C (ASCORBIC ACID) tablet 1,000 mg  1,000 mg Oral Daily Harrie Foreman, MD   1,000 mg at 04/11/18 1505     Discharge Medications: Please see discharge summary for a list of discharge medications.  Relevant Imaging Results:  Relevant Lab Results:   Additional Information SSN: [id number]  Latanya Maudlin, RN

## 2018-04-11 NOTE — Care Management Important Message (Signed)
Important Message  Patient Details  Name: Ana Washington MRN: [id number] Date of Birth: 1946-01-17   Medicare Important Message Given:  Yes    [name] 04/11/2018, 11:06 AM

## 2018-04-11 NOTE — Progress Notes (Addendum)
[place] [facility] [place] NAME: Ana Washington    MR#:  [id number]  DATE OF BIRTH:  03/04/1945  SUBJECTIVE:   Patient better back pain.  No nausea or vomiting.  REVIEW OF SYSTEMS:    Review of Systems  Constitutional: Negative for chills and fever.  HENT: Negative for congestion and tinnitus.   Eyes: Negative for blurred vision and double vision.  Respiratory: Negative for cough, shortness of breath and wheezing.   Cardiovascular: Negative for chest pain, orthopnea and PND.  Gastrointestinal: Negative for abdominal pain, diarrhea, nausea and vomiting.  Genitourinary: Negative for dysuria and hematuria.  Musculoskeletal: Positive for back pain.  Neurological: Negative for dizziness, sensory change and focal weakness.  Psychiatric/Behavioral: The patient is nervous/anxious.   All other systems reviewed and are negative.   Nutrition: Heart Healthy Tolerating Diet: Yes Tolerating PT: Await Eval.   DRUG ALLERGIES:   Allergies  Allergen Reactions   Sulfa Antibiotics Diarrhea   Codeine Nausea And Vomiting   Penicillins Rash    Has patient had a PCN reaction causing immediate rash, facial/tongue/throat swelling, SOB or lightheadedness with hypotension: Unknown Has patient had a PCN reaction causing severe rash involving mucus membranes or skin necrosis: Unknown Has patient had a PCN reaction that required hospitalization: Unknown Has patient had a PCN reaction occurring within the last 10 years: No If all of the above answers are "NO", then may proceed with Cephalosporin use.     VITALS:  Blood pressure (!) 178/109, pulse 78, temperature 98.4 F (36.9 C), temperature source Oral, resp. rate 17, height 5' (1.524 m), weight 49.9 kg, SpO2 97 %.  PHYSICAL EXAMINATION:   Physical Exam  GENERAL:  72 y.o.-year-old thin patient lying in bed anxious and in mild distress due to back pain. [name]  EYES: Pupils equal, round, reactive to light  and accommodation. No scleral icterus. Extraocular muscles intact.  HEENT: Head atraumatic, normocephalic. Oropharynx and nasopharynx clear.  NECK:  Supple, no jugular venous distention. No thyroid enlargement, no tenderness.  LUNGS: Normal breath sounds bilaterally, no wheezing, + dry rales b/l, no rhonchi. + use of accessory muscles of respiration.  CARDIOVASCULAR: S1, S2 Irregular. No murmurs, rubs, or gallops.  ABDOMEN: Soft, nontender, nondistended. Bowel sounds present. No organomegaly or mass.  EXTREMITIES: No cyanosis, clubbing or edema b/l.    NEUROLOGIC: Cranial nerves II through XII are intact. No focal Motor or sensory deficits b/l.   PSYCHIATRIC: The patient is alert and oriented x 3.  SKIN: No obvious rash, lesion, or ulcer.    LABORATORY PANEL:   CBC Recent Labs  Lab 04/09/18 1244  WBC 19.5*  HGB 11.3*  HCT 34.1*  PLT 352   ------------------------------------------------------------------------------------------------------------------  Chemistries  Recent Labs  Lab 04/07/18 2322  04/11/18 0407  NA 135   < > 135  K 3.0*   < > 4.1  CL 90*   < > 94*  CO2 36*   < > 32  GLUCOSE 111*   < > 83  BUN 7*   < > 9  CREATININE 0.54   < > 0.61  CALCIUM 8.8*   < > 9.6  MG 2.0   < > 2.1  AST 24  --   --   ALT 13  --   --   ALKPHOS 74  --   --   BILITOT 0.8  --   --    < > = values in this interval not  displayed.   ------------------------------------------------------------------------------------------------------------------  Cardiac Enzymes Recent Labs  Lab 04/07/18 2322  TROPONINI <0.03   ------------------------------------------------------------------------------------------------------------------  RADIOLOGY:  Mr Thoracic Spine Wo Contrast  Result Date: 04/10/2018 CLINICAL DATA:  Patient status post T10 kyphoplasty 04/05/2018. Severe thoracic spine pain. EXAM: MRI THORACIC SPINE WITHOUT CONTRAST TECHNIQUE: Multiplanar, multisequence MR imaging of the  thoracic spine was performed. No intravenous contrast was administered. COMPARISON:  CT chest 04/08/2018. FINDINGS: Alignment:  No listhesis.  Convex right scoliosis noted. Vertebrae: The patient has an inferior endplate compression fracture of T7 with vertebral body height loss of 5-10% and associated mild marrow edema. There is also a biconcave compression fracture of T8 with vertebral body height loss of up to 50% centrally and associated marrow edema. Edema is most intense in the inferior endplate. These fractures do not involve the posterior elements. There is little to no bony retropulsion at either level. Remote T9 to L2 compression fractures are seen with the patient is status post vertebral augmentation at each level. Cord:  Normal signal throughout. Paraspinal and other soft tissues: Hiatal hernia in the lower left chest and left basilar atelectasis appear unchanged. Disc levels: T7-8: Small right paracentral protrusion without stenosis. T8-9: Negative. T10: There is some bony retropulsion containing cement in a right paracentral position without central canal stenosis. Except as described above, intervertebral disc spaces are unremarkable. IMPRESSION: Acute or subacute inferior endplate compression fracture of T7 with vertebral body height loss of 5-10% and no bony retropulsion. Acute or subacute biconcave compression fracture of T8 with vertebral body height loss of up to 50%. Marrow edema is more intense in T8 than T7. Negative for central canal or foraminal stenosis. Remote T9-L2 compression fractures with postoperative change of vertebral augmentation at each level. Electronically Signed   By: Inge Rise M.D.   On: 04/10/2018 14:44     ASSESSMENT AND PLAN:   73 year old female with past medical history of chronic respiratory failure secondary to bronchiectasis, back pain secondary to compression fracture status post recent kyphoplasty, history of peptic ulcer disease, hypertension, GERD,  coronary disease, atrial fibrillation who presents to the hospital due to intractable nausea vomiting and abdominal pain.  1.  Intractable nausea vomiting abdominal pain-etiology unclear, patient CT abdomen pelvis was negative for acute pathology.  Patient LFTs are stable lipase is normal.  Suspect this is likely gastritis/GERD.   - continue PPI, Maalox and supportive care.   2.  Back pain- patient is status post recent kyphoplasty a few days back.  She continues to complain of significant back pain.   Continue supportive care with pain control with IV Dilaudid ordered. T7-T8 acute or subacute compression per MRI.  Dr. Rudene Christians will do kyphoplasty tomorrow.  3.  Atrial fibrillation with rapid ventricular response- much improved and off Cardizem drip. -Continue oral Cardizem, Toprol.  Appreciate cardiology input.   Hold Eliquis for 48 hours for kyphoplasty.  4.  Shortness of breath- patient has chronic shortness of breath due to her bronchiectasis.  She is not significantly hypoxic.  Continue supportive care O2 supplementation for now.    5.  Hypokalemia-secondary to the nausea vomiting. -Improved with supplement.  6.  Essential hypertension-continue losartan, Toprol, Norvasc.  7.  GERD- continue Protonix, Carafate   PT suggest nursing home placement.  The patient also wants nursing home. All the records are reviewed and case discussed with Care Management/Social Worker. Management plans discussed with the patient, her sister and they are in agreement.  CODE STATUS: Full code  DVT Prophylaxis:  Eliquis  TOTAL TIME TAKING CARE OF THIS PATIENT: 28 minutes.   POSSIBLE D/C IN 2 DAYS, DEPENDING ON CLINICAL CONDITION.   Demetrios Loll M.D on 04/11/2018 at 1:37 PM  Between 7am to 6pm - Pager - [phone]  After 6pm go to [email or web address] - Proofreader  [facility]  Office  [phone]  CC: Primary care physician; [name], MD

## 2018-04-11 NOTE — Progress Notes (Signed)
PT Cancellation Note  Patient Details Name: Ana Washington MRN: [id number] DOB: 18-Oct-1945   Cancelled Treatment:    Reason Eval/Treat Not Completed: Patient declined, no reason specified.  Order received and chart reviewed.  Pt eager to work with therapy but requesting time to finish breakfast.  Will re-attempt shortly.  Roxanne Gates, PT, DPT  Roxanne Gates 04/11/2018, 8:59 AM

## 2018-04-11 NOTE — Evaluation (Signed)
Physical Therapy Evaluation Patient Details Name: Ana Washington MRN: [id number] DOB: 02-Jul-1945 Today's Date: 04/11/2018   History of Present Illness  Pt is a 73 year old female admitted with T10 compression fracture.  Kyphoplasty performed 3/8.  Pt experienced a-fib and RVR; rapid response called and pt trasferred to ICU.  Compression fractures in T7/8 found on MRI and second kyphoplasty scheduled 04/12/2018.  PMH includes bronchiectasis, atrial fibrillation, dumping syndrome and arthritis.  Clinical Impression  Pt is a 73 year old female who lives in a one story home with her husband who required a caretaker.  Pt has been performing household ambulation only with a RW.  She is in bed and reporting high pain level in back and abdomen upon PT arrival.  She presented with generalized weakness and pain limited movement, though she is able to do a SLR bilaterally.  Pt willing to do supine there ex and stated that she is normally a very active person.  Pt able to complete LE there ex with report of mild pain increase.  Pt open to all education and hand out issued.  Pt became very anxious with bed mobility and RR began to increase.  Bed mobility and transfers deferred at this time due to this and pain level.  Pt will benefit from skilled PT with focus on strength, tolerance to activity, pain management and safe functional mobility.  Due to decrease in mobility, SNF is recommended for discharge planning.     Follow Up Recommendations SNF    Equipment Recommendations  None recommended by PT    Recommendations for Other Services       Precautions / Restrictions Precautions Precautions: Back Precaution Booklet Issued: Yes (comment) Restrictions Weight Bearing Restrictions: No      Mobility  Bed Mobility Overal bed mobility: Needs Assistance             General bed mobility comments: Scooting up in bed: pt able to use LE's to assist with bed mobility, pain reported  Transfers Overall  transfer level: (Deferred due to pain)                  Ambulation/Gait                Stairs            Wheelchair Mobility    Modified Rankin (Stroke Patients Only)       Balance Overall balance assessment: (Not tested)                                           Pertinent Vitals/Pain Pain Assessment: Faces Faces Pain Scale: Hurts even more Pain Location: mid back and abdomen with all LE movement and bed mobility Pain Descriptors / Indicators: Aching Pain Intervention(s): Limited activity within patient's tolerance;Monitored during session    Home Living                        Prior Function                 Hand Dominance        Extremity/Trunk Assessment   Upper Extremity Assessment Upper Extremity Assessment: Generalized weakness    Lower Extremity Assessment Lower Extremity Assessment: Generalized weakness(Pain limited)    Cervical / Trunk Assessment Cervical / Trunk Assessment: Kyphotic  Communication      Cognition Arousal/Alertness:  Awake/alert Behavior During Therapy: WFL for tasks assessed/performed Overall Cognitive Status: Within Functional Limits for tasks assessed                                 General Comments: follows directions consistently.      General Comments      Exercises General Exercises - Lower Extremity Ankle Circles/Pumps: 20 reps;Both;Supine Gluteal Sets: Strengthening;Both;15 reps;Supine Short Arc Quad: Strengthening;Both;10 reps;Supine Hip ABduction/ADduction: Strengthening;Both;10 reps;Supine(pillow squeeze) Other Exercises Other Exercises: Time to educate regarding PT scheduling relative to post surgery Other Exercises: HEP-handout issued   Assessment/Plan    PT Assessment Patient needs continued PT services  PT Problem List Decreased strength;Decreased mobility;Decreased balance;Pain;Decreased activity tolerance       PT Treatment  Interventions Therapeutic activities;DME instruction;Gait training;Therapeutic exercise;Stair training;Balance training;Functional mobility training;Patient/family education    PT Goals (Current goals can be found in the Care Plan section)  Acute Rehab PT Goals Patient Stated Goal: Pain control PT Goal Formulation: With patient Time For Goal Achievement: 04/25/18 Potential to Achieve Goals: Good    Frequency BID   Barriers to discharge        Co-evaluation               AM-PAC PT "6 Clicks" Mobility  Outcome Measure Help needed turning from your back to your side while in a flat bed without using bedrails?: A Lot Help needed moving from lying on your back to sitting on the side of a flat bed without using bedrails?: A Lot Help needed moving to and from a bed to a chair (including a wheelchair)?: A Lot Help needed standing up from a chair using your arms (e.g., wheelchair or bedside chair)?: A Lot Help needed to walk in hospital room?: A Lot Help needed climbing 3-5 steps with a railing? : Total 6 Click Score: 11    End of Session Equipment Utilized During Treatment: Oxygen Activity Tolerance: Patient limited by pain Patient left: in bed Nurse Communication: Mobility status PT Visit Diagnosis: Muscle weakness (generalized) (M62.81)    Time: 3646-8032 PT Time Calculation (min) (ACUTE ONLY): 29 min   Charges:   PT Evaluation $PT Eval Moderate Complexity: 1 Mod PT Treatments $Therapeutic Exercise: 8-22 mins      Roxanne Gates, PT, DPT   Roxanne Gates 04/11/2018, 10:55 AM

## 2018-04-11 NOTE — Progress Notes (Signed)
OT Cancellation Note  Patient Details Name: Ana Washington MRN: [id number] DOB: 1945/08/31   Cancelled Treatment:    Reason Eval/Treat Not Completed: Other (comment). OT consult received, chart reviewed. Pt scheduled for T7-8 kyphoplasty for tomorrow, 04/12/2018, per orthopedic surgery. Will hold OT evaluation at this time and re-attempt after surgery. Will require new OT order or a continue at transfer with current order. Will continue to follow acutely for appropriateness.   Jeni Salles, MPH, MS, OTR/L ascom [phone] 04/11/18, 3:30 PM

## 2018-04-11 NOTE — Progress Notes (Signed)
Physical Therapy Treatment Patient Details Name: Ana Washington MRN: [id number] DOB: 1945-04-03 Today's Date: 04/11/2018    History of Present Illness Pt is a 73 year old female admitted with T10 compression fracture.  Kyphoplasty performed 3/8.  Pt experienced a-fib and RVR; rapid response called and pt trasferred to ICU.  Compression fractures in T7/8 found on MRI and second kyphoplasty scheduled 04/12/2018.  PMH includes bronchiectasis, atrial fibrillation, dumping syndrome and arthritis.    PT Comments    Pt presented as more lethargic this afternoon but reported less pain.  She was able to perform LE there ex with min A and with minimal report of pain increase.  Pt able to follow directions for log roll to sit at EOB without assistance for balance and perform seated there ex.  Pt was open to attempting to stand and was able to rise from elevated bedside without physical assistance from PT.  Once standing, pt steadied herself with RW but her RR began to increase and pt reported difficulty breathing.  Pt requested to return to bed and PT and pt's friend assisted pt back to bed.  Pt's O2 level remained WNL throughout.  Pt will continue to benefit from skilled PT with focus on strength, tolerance to activity, pain management and functional mobility.  SNF recommendation remains appropriate.   Follow Up Recommendations  SNF     Equipment Recommendations  None recommended by PT    Recommendations for Other Services       Precautions / Restrictions Precautions Precautions: Back Precaution Booklet Issued: Yes (comment) Restrictions Weight Bearing Restrictions: No    Mobility  Bed Mobility Overal bed mobility: Needs Assistance Bed Mobility: Supine to Sit;Sit to Supine     Supine to sit: Min assist Sit to supine: Min assist   General bed mobility comments: VC's for log rolling and hand held assist to sit upright.  Pt able to return to bed with min A to reverse log  roll.  Transfers Overall transfer level: Needs assistance Equipment used: Rolling walker (2 wheeled) Transfers: Sit to/from Stand Sit to Stand: Min guard         General transfer comment: Pt able to rise without assistance; RR increased and pt closed her eyes and stopped responding to PT.  When asked, pt stated that she "can't breathe" and pt assisted to sit at bedside and with deep breathing exercises.  Ambulation/Gait Ambulation/Gait assistance: (Deferred.)               Stairs             Wheelchair Mobility    Modified Rankin (Stroke Patients Only)       Balance Overall balance assessment: Needs assistance Sitting-balance support: Bilateral upper extremity supported;Feet supported Sitting balance-Leahy Scale: Good     Standing balance support: Bilateral upper extremity supported Standing balance-Leahy Scale: Fair                              Cognition Arousal/Alertness: Lethargic Behavior During Therapy: WFL for tasks assessed/performed Overall Cognitive Status: Within Functional Limits for tasks assessed                                 General Comments: Pt able to follow directions; pt closing eyes intermittently throughout session.      Exercises General Exercises - Lower Extremity Ankle Circles/Pumps: 20 reps;Both;Supine Quad Sets:  Strengthening;Both;10 reps;Supine Gluteal Sets: Strengthening;Both;15 reps;Supine Short Arc Quad: Strengthening;Both;10 reps;Supine Hip ABduction/ADduction: Strengthening;Both;10 reps;Seated;Supine(pillow squeeze) Other Exercises Other Exercises: Education regarding benefit of SNF related to pt's current mobility and caregiver support x5 min    General Comments        Pertinent Vitals/Pain Pain Assessment: Faces Faces Pain Scale: Hurts little more Pain Location: Thoracic spine Pain Descriptors / Indicators: Aching Pain Intervention(s): Limited activity within patient's  tolerance;Monitored during session    Home Living                      Prior Function            PT Goals (current goals can now be found in the care plan section) Acute Rehab PT Goals Patient Stated Goal: Pain control PT Goal Formulation: With patient Time For Goal Achievement: 04/25/18 Potential to Achieve Goals: Good Progress towards PT goals: Progressing toward goals    Frequency    BID      PT Plan Current plan remains appropriate    Co-evaluation              AM-PAC PT "6 Clicks" Mobility   Outcome Measure  Help needed turning from your back to your side while in a flat bed without using bedrails?: A Little Help needed moving from lying on your back to sitting on the side of a flat bed without using bedrails?: A Lot Help needed moving to and from a bed to a chair (including a wheelchair)?: A Lot Help needed standing up from a chair using your arms (e.g., wheelchair or bedside chair)?: A Lot Help needed to walk in hospital room?: A Lot Help needed climbing 3-5 steps with a railing? : Total 6 Click Score: 12    End of Session Equipment Utilized During Treatment: Oxygen Activity Tolerance: Patient limited by pain;Other (comment)(Limited due to pt level of anxiety and increase in RR.) Patient left: in bed;with call bell/phone within reach;with bed alarm set;with family/visitor present   PT Visit Diagnosis: Muscle weakness (generalized) (M62.81);Unsteadiness on feet (R26.81);Pain Pain - part of body: (back)     Time: 1410-1436 PT Time Calculation (min) (ACUTE ONLY): 26 min  Charges:  $Therapeutic Exercise: 8-22 mins $Therapeutic Activity: 8-22 mins                     Roxanne Gates, PT, DPT   Roxanne Gates 04/11/2018, 2:47 PM

## 2018-04-12 ENCOUNTER — Inpatient Hospital Stay: Payer: Medicare Other | Admitting: Anesthesiology

## 2018-04-12 ENCOUNTER — Other Ambulatory Visit: Payer: Self-pay

## 2018-04-12 ENCOUNTER — Inpatient Hospital Stay: Payer: Medicare Other

## 2018-04-12 ENCOUNTER — Encounter
Admission: EM | Disposition: A | Discharge status: Hospice | Payer: Self-pay | Source: Home / Self Care | Attending: Specialist

## 2018-04-12 HISTORY — PX: KYPHOPLASTY: SHX5884

## 2018-04-12 LAB — BASIC METABOLIC PANEL
Anion gap: 10 (ref 5–15)
BUN: 12 mg/dL (ref 8–23)
CALCIUM: 9.5 mg/dL (ref 8.9–10.3)
CO2: 31 mmol/L (ref 22–32)
Chloride: 90 mmol/L — ABNORMAL LOW (ref 98–111)
Creatinine, Ser: 0.7 mg/dL (ref 0.44–1.00)
GFR calc Af Amer: >60 mL/min (ref >60)
GFR calc non Af Amer: >60 mL/min (ref >60)
Glucose, Bld: 127 mg/dL — ABNORMAL HIGH (ref 70–99)
Potassium: 4.5 mmol/L (ref 3.5–5.1)
Sodium: 131 mmol/L — ABNORMAL LOW (ref 135–145)

## 2018-04-12 LAB — CBC
HCT: 36.5 % (ref 36.0–46.0)
Hemoglobin: 12.1 g/dL (ref 12.0–15.0)
MCH: 33.7 pg (ref 26.0–34.0)
MCHC: 33.2 g/dL (ref 30.0–36.0)
MCV: 101.7 fL — ABNORMAL HIGH (ref 80.0–100.0)
Platelets: 396 10*3/uL (ref 150–400)
RBC: 3.59 MIL/uL — ABNORMAL LOW (ref 3.87–5.11)
RDW: 11.5 % (ref 11.5–15.5)
WBC: 17.7 10*3/uL — ABNORMAL HIGH (ref 4.0–10.5)
nRBC: 0 % (ref 0.0–0.2)

## 2018-04-12 LAB — MAGNESIUM: Magnesium: 1.9 mg/dL (ref 1.7–2.4)

## 2018-04-12 SURGERY — KYPHOPLASTY
Anesthesia: Monitor Anesthesia Care

## 2018-04-12 MED ORDER — LIDOCAINE HCL 1 % IJ SOLN
INTRAMUSCULAR | Status: DC | PRN
  Administered 2018-04-12: 30 mL

## 2018-04-12 MED ORDER — LABETALOL HCL 5 MG/ML IV SOLN: 5.0000 mg | INTRAVENOUS | Status: DC | PRN

## 2018-04-12 MED ORDER — FENTANYL CITRATE (PF) 100 MCG/2ML IJ SOLN
INTRAMUSCULAR | Status: DC | PRN
  Administered 2018-04-12 (×2): 50 ug via INTRAVENOUS

## 2018-04-12 MED ORDER — KETOROLAC TROMETHAMINE 30 MG/ML IJ SOLN
INTRAMUSCULAR | Status: AC
  Filled 2018-04-12: qty 1

## 2018-04-12 MED ORDER — FENTANYL CITRATE (PF) 100 MCG/2ML IJ SOLN
INTRAMUSCULAR | Status: AC
  Filled 2018-04-12: qty 2

## 2018-04-12 MED ORDER — SODIUM CHLORIDE 0.9 % IV SOLN
INTRAVENOUS | Status: DC | PRN
  Administered 2018-04-12 – 2018-04-14 (×2): 500 mL via INTRAVENOUS

## 2018-04-12 MED ORDER — APIXABAN 5 MG PO TABS
5.0000 mg | ORAL_TABLET | Freq: Two times a day (BID) | ORAL | Status: DC
  Administered 2018-04-13 – 2018-04-14 (×3): 5 mg via ORAL
  Filled 2018-04-12 (×3): qty 1

## 2018-04-12 MED ORDER — SENNA 8.6 MG PO TABS
1.0000 | ORAL_TABLET | Freq: Every day | ORAL | Status: DC
  Administered 2018-04-12 – 2018-04-13 (×2): 8.6 mg via ORAL
  Filled 2018-04-12 (×3): qty 1

## 2018-04-12 MED ORDER — LABETALOL HCL 5 MG/ML IV SOLN
INTRAVENOUS | Status: AC
  Administered 2018-04-12: 5 mg via INTRAVENOUS
  Filled 2018-04-12: qty 4

## 2018-04-12 MED ORDER — BUPIVACAINE-EPINEPHRINE (PF) 0.5% -1:200000 IJ SOLN
INTRAMUSCULAR | Status: DC | PRN
  Administered 2018-04-12: 20 mL

## 2018-04-12 MED ORDER — FENTANYL CITRATE (PF) 100 MCG/2ML IJ SOLN: 25.0000 ug | INTRAMUSCULAR | Status: DC | PRN

## 2018-04-12 MED ORDER — LABETALOL HCL 5 MG/ML IV SOLN
5.0000 mg | INTRAVENOUS | Status: DC | PRN
  Administered 2018-04-12 (×4): 5 mg via INTRAVENOUS

## 2018-04-12 MED ORDER — KETOROLAC TROMETHAMINE 30 MG/ML IJ SOLN
15.0000 mg | Freq: Four times a day (QID) | INTRAMUSCULAR | Status: DC | PRN
  Administered 2018-04-12 – 2018-04-14 (×5): 15 mg via INTRAVENOUS
  Filled 2018-04-12 (×4): qty 1

## 2018-04-12 MED ORDER — IOPAMIDOL (ISOVUE-M 200) INJECTION 41%
INTRAMUSCULAR | Status: DC | PRN
  Administered 2018-04-12: 10 mL

## 2018-04-12 MED ORDER — KETAMINE HCL 50 MG/ML IJ SOLN
INTRAMUSCULAR | Status: AC
  Filled 2018-04-12: qty 10

## 2018-04-12 MED ORDER — PROPOFOL 10 MG/ML IV BOLUS
INTRAVENOUS | Status: AC
  Filled 2018-04-12: qty 20

## 2018-04-12 MED ORDER — KETAMINE HCL 50 MG/ML IJ SOLN
INTRAMUSCULAR | Status: DC | PRN
  Administered 2018-04-12: 50 mg via INTRAMUSCULAR

## 2018-04-12 MED ORDER — ONDANSETRON HCL 4 MG/2ML IJ SOLN: 4.0000 mg | Freq: Once | INTRAMUSCULAR | Status: DC | PRN

## 2018-04-12 MED ORDER — CLINDAMYCIN PHOSPHATE 600 MG/50ML IV SOLN
INTRAVENOUS | Status: AC
  Filled 2018-04-12: qty 50

## 2018-04-12 MED ORDER — LIDOCAINE HCL (PF) 1 % IJ SOLN
INTRAMUSCULAR | Status: AC
  Filled 2018-04-12: qty 60

## 2018-04-12 MED ORDER — LACTATED RINGERS IV SOLN
INTRAVENOUS | Status: DC
  Administered 2018-04-12: 13:00:00 via INTRAVENOUS

## 2018-04-12 MED ORDER — BLISTEX MEDICATED EX OINT
1.0000 "application " | TOPICAL_OINTMENT | CUTANEOUS | Status: DC | PRN
  Filled 2018-04-12: qty 6.3

## 2018-04-12 MED ORDER — BUPIVACAINE-EPINEPHRINE (PF) 0.5% -1:200000 IJ SOLN
INTRAMUSCULAR | Status: AC
  Filled 2018-04-12: qty 30

## 2018-04-12 MED ORDER — PROPOFOL 10 MG/ML IV BOLUS
INTRAVENOUS | Status: DC | PRN
  Administered 2018-04-12 (×2): 10 mg via INTRAVENOUS

## 2018-04-12 MED ORDER — MAGNESIUM SULFATE IN D5W 1-5 GM/100ML-% IV SOLN
1.0000 g | Freq: Once | INTRAVENOUS | Status: AC
  Administered 2018-04-12: 07:00:00 1 g via INTRAVENOUS
  Filled 2018-04-12: qty 100

## 2018-04-12 SURGICAL SUPPLY — 20 items
CEMENT KYPHON CX01A KIT/MIXER (Cement) ×2 IMPLANT
COVER WAND RF STERILE (DRAPES) ×2 IMPLANT
DERMABOND ADVANCED (GAUZE/BANDAGES/DRESSINGS) ×1
DERMABOND ADVANCED .7 DNX12 (GAUZE/BANDAGES/DRESSINGS) ×1 IMPLANT
DEVICE BIOPSY BONE KYPH (INSTRUMENTS) ×2 IMPLANT
DEVICE BIOPSY BONE KYPHX (INSTRUMENTS) IMPLANT
DRAPE C-ARM XRAY 36X54 (DRAPES) ×2 IMPLANT
DURAPREP 26ML APPLICATOR (WOUND CARE) ×2 IMPLANT
GLOVE SURG SYN 9.0  PF PI (GLOVE) ×1
GLOVE SURG SYN 9.0 PF PI (GLOVE) ×1 IMPLANT
GOWN SRG 2XL LVL 4 RGLN SLV (GOWNS) ×1 IMPLANT
GOWN STRL NON-REIN 2XL LVL4 (GOWNS) ×1
GOWN STRL REUS W/ TWL LRG LVL3 (GOWN DISPOSABLE) ×1 IMPLANT
GOWN STRL REUS W/TWL LRG LVL3 (GOWN DISPOSABLE) ×1
PACK KYPHOPLASTY (MISCELLANEOUS) ×2 IMPLANT
RENTAL RFA GENERATOR (MISCELLANEOUS) IMPLANT
STRAP SAFETY 5IN WIDE (MISCELLANEOUS) ×2 IMPLANT
TRAY KYPHOPAK 15/2 EXPRESS (KITS) ×2 IMPLANT
TRAY KYPHOPAK 15/3 EXPRESS 1ST (MISCELLANEOUS) IMPLANT
TRAY KYPHOPAK 20/3 EXPRESS 1ST (MISCELLANEOUS) IMPLANT

## 2018-04-12 NOTE — Progress Notes (Signed)
Patient with severe pain at T7 and T8, plan kyphoplasty at T7-T8 today.

## 2018-04-12 NOTE — Progress Notes (Signed)
Patient with history of paroxysmal atrial fibrillation.  Admitted with back pain and intermittent episodes of atrial fibrillation rapid ventricular response.  Reviewed strips.  Appears to be apparent A. fib as well as A. fib with RVR.  Would proceed with kyphoplasty as planned with routine cardiac monitoring.  Patient is optimized as best possible from a cardiac standpoint.  Would continue with Cardizem 60 mg every 12 hours as well as metoprolol succinate 50 mg daily.

## 2018-04-12 NOTE — Anesthesia Post-op Follow-up Note (Signed)
Anesthesia QCDR form completed.        

## 2018-04-12 NOTE — TOC Progression Note (Signed)
Transition of Care [facility]) - Progression Note    Patient Details  Name: Ana Washington MRN: [id number] Date of Birth: 1945/05/09  Transition of Care [facility]) CM/SW [place], [place] Phone Number: 04/12/2018, 11:24 AM  Clinical Narrative:   CSW met with patient and family at bedside to discuss bed offers for SNF. Patient states that she will not be going to SNF at this time due to the threat of sickness and family not being able to visit her there. Patient states that she would prefer to go home with home health. CSW asked if patient has a choice of agency and explained that she could use [facility] for home health. Patient states that she would like to use [facility] since she is familiar with the company. Patient would like to finalize details of discharge after her surgery later today. CSW will follow up with patient after surgery.     Expected Discharge Plan: [place]: Home with home health  Barriers to Discharge: Ship broker, Continued Medical Work up, SNF Pending bed offer  Expected Discharge Plan and Services Expected Discharge Plan: [place]: Home with home health    Post Acute Care Choice: [place]: home  Living arrangements for the past 2 months: Single Family Home                           Social Determinants of Health (SDOH) Interventions    Readmission Risk Interventions 30 Day Unplanned Readmission Risk Score     ED to Hosp-Admission (Current) from 04/07/2018 in [place] (1C)  30 Day Unplanned Readmission Risk Score (%)  15 Filed at 04/12/2018 0800     This score is the patient's risk of an unplanned readmission within 30 days of being discharged (0 -100%). The score is based on dignosis, age, lab data, medications, orders, and past utilization.   Low:  0-14.9   Medium: 15-21.9   High: 22-29.9   Extreme: 30 and above       Readmission Risk  Prevention Plan 01/06/2018  Transportation Screening Complete  PCP or Specialist Appt within 5-7 Days Complete  Home Care Screening Complete  Medication Review (RN CM) Complete  Some recent data might be hidden

## 2018-04-12 NOTE — Anesthesia Preprocedure Evaluation (Signed)
Anesthesia Evaluation  Patient identified by MRN, date of birth, ID band Patient awake    Reviewed: Allergy & Precautions, H&P , NPO status , Patient's Chart, lab work & pertinent test results, reviewed documented beta blocker date and time   History of Anesthesia Complications (+) [name], Family history of anesthesia reaction and history of anesthetic complications  Airway Mallampati: II  TM Distance: >3 FB Neck ROM: full    Dental no notable dental hx. (+) Teeth Intact   Pulmonary neg pulmonary ROS, shortness of breath,    Pulmonary exam normal breath sounds clear to auscultation       Cardiovascular Exercise Tolerance: Poor hypertension, On Medications + CAD and + Past MI  negative cardio ROS  + dysrhythmias Atrial Fibrillation  Rhythm:regular Rate:Normal  Cleared by Cards for above... Will proceed. JA   Neuro/Psych  Headaches,  Neuromuscular disease negative neurological ROS  negative psych ROS   GI/Hepatic negative GI ROS, Neg liver ROS, PUD, GERD  ,  Endo/Other  negative endocrine ROSdiabetes  Renal/GU      Musculoskeletal   Abdominal   Peds  Hematology negative hematology ROS (+)   Anesthesia Other Findings   Reproductive/Obstetrics negative OB ROS                             Anesthesia Physical Anesthesia Plan  ASA: IV  Anesthesia Plan: MAC   Post-op Pain Management:    Induction:   [name] Risk Score and Plan:   Airway Management Planned:   Additional Equipment:   Intra-op Plan:   Post-operative Plan:   Informed Consent: I have reviewed the patients History and Physical, chart, labs and discussed the procedure including the risks, benefits and alternatives for the proposed anesthesia with the patient or authorized representative who has indicated his/her understanding and acceptance.       Plan Discussed with: CRNA  Anesthesia Plan Comments:          Anesthesia Quick Evaluation

## 2018-04-12 NOTE — Progress Notes (Signed)
Pharmacy Electrolyte Monitoring Consult:  Pharmacy consulted to assist in monitoring and replacing electrolytes in this 73 y.o. female admitted on 04/07/2018 with Abdominal Pain and Nausea   Labs:  Sodium (mmol/L)  Date Value  04/12/2018 131 (L)  03/01/2018 138   Potassium (mmol/L)  Date Value  04/12/2018 4.5   Magnesium (mg/dL)  Date Value  04/12/2018 1.9   Phosphorus (mg/dL)  Date Value  04/09/2018 3.0   Calcium (mg/dL)  Date Value  04/12/2018 9.5   Albumin (g/dL)  Date Value  04/07/2018 3.5  03/01/2018 3.8    Assessment/Plan: 3/12 K: 4.5, Mg: 1.9. K+ trending up. Patient has KCL 22mEq QD ordered. Would consider DC. MD has already ordered Magnesium 1g IV x 1 dose.    No additional replacement needed at this time.   Will continue to replace for goal potassium ~ 4 and goal magnesium ~ 2.   Plan to F/U with AM labs and continue to monitor and adjust per consult.   Pernell Dupre, PharmD, BCPS Clinical Pharmacist 04/12/2018 7:59 AM

## 2018-04-12 NOTE — Progress Notes (Signed)
OT Cancellation Note  Patient Details Name: Ana Washington MRN: [id number] DOB: 1946/01/24   Cancelled Treatment:    Reason Eval/Treat Not Completed: Other (comment). Plan for T7 and T8 kyphoplasties today. Will continue holding OT evaluation and re-attempt once procedure has been completed and pt is cleared for participation in therapy.  Jeni Salles, MPH, MS, OTR/L ascom [phone] 04/12/18, 10:16 AM

## 2018-04-12 NOTE — Progress Notes (Signed)
PT Cancellation Note  Patient Details Name: Ana Washington MRN: [id number] DOB: 1945-07-04   Cancelled Treatment:    Reason Eval/Treat Not Completed: (Per chart review, patient scheduled for additional T7 and T8 kyphoplasties today.  Will hold continued mobility until procedure completed and patient cleared for activity.)   Ellison Hughs 04/12/2018, 8:35 AM

## 2018-04-12 NOTE — Transfer of Care (Signed)
Immediate Anesthesia Transfer of Care Note  Patient: EVELETTE HOLLERN  Procedure(s) Performed: KYPHOPLASTY T7,8 (N/A )  Patient Location: PACU  Anesthesia Type:General  Level of Consciousness: awake, alert  and oriented  Airway & Oxygen Therapy: Patient Spontanous Breathing and Patient connected to nasal cannula oxygen  Post-op Assessment: Report given to RN and Post -op Vital signs reviewed and stable  Post vital signs: Reviewed and stable  Last Vitals:  Vitals Value Taken Time  BP 165/100 04/12/2018  1:50 PM  Temp 36.3 C 04/12/2018  1:50 PM  Pulse 93 04/12/2018  1:55 PM  Resp 23 04/12/2018  1:55 PM  SpO2 91 % 04/12/2018  1:55 PM  Vitals shown include unvalidated device data.  Last Pain:  Vitals:   04/12/18 1350  TempSrc:   PainSc: Asleep      Patients Stated Pain Goal: 0 (92/17/83 7542)  Complications: No apparent anesthesia complications

## 2018-04-12 NOTE — Progress Notes (Signed)
[place] [facility] [place] NAME: Ana Washington    MR#:  [id number]  DATE OF BIRTH:  12/22/1945  SUBJECTIVE:   Patient better back pain.  No nausea or vomiting. REVIEW OF SYSTEMS:    Review of Systems  Constitutional: Negative for chills and fever.  HENT: Negative for congestion and tinnitus.   Eyes: Negative for blurred vision and double vision.  Respiratory: Negative for cough, shortness of breath and wheezing.   Cardiovascular: Negative for chest pain, orthopnea and PND.  Gastrointestinal: Negative for abdominal pain, diarrhea, nausea and vomiting.  Genitourinary: Negative for dysuria and hematuria.  Musculoskeletal: Positive for back pain.  Neurological: Negative for dizziness, sensory change and focal weakness.  Psychiatric/Behavioral: The patient is nervous/anxious.   All other systems reviewed and are negative.   Nutrition: Heart Healthy Tolerating Diet: Yes Tolerating PT: Await Eval.   DRUG ALLERGIES:   Allergies  Allergen Reactions  . Sulfa Antibiotics Diarrhea  . Codeine Nausea And Vomiting  . Penicillins Rash    Has patient had a PCN reaction causing immediate rash, facial/tongue/throat swelling, SOB or lightheadedness with hypotension: Unknown Has patient had a PCN reaction causing severe rash involving mucus membranes or skin necrosis: Unknown Has patient had a PCN reaction that required hospitalization: Unknown Has patient had a PCN reaction occurring within the last 10 years: No If all of the above answers are "NO", then may proceed with Cephalosporin use.     VITALS:  Blood pressure (!) 144/86, pulse 79, temperature (!) 97.3 F (36.3 C), resp. rate 15, height 5' (1.524 m), weight 51 kg, SpO2 99 %.  PHYSICAL EXAMINATION:   Physical Exam  GENERAL:  73 y.o.-year-old thin patient lying in bed anxious and in mild distress due to back pain. [name]  EYES: Pupils equal, round, reactive to light and accommodation. No  scleral icterus. Extraocular muscles intact.  HEENT: Head atraumatic, normocephalic. Oropharynx and nasopharynx clear.  NECK:  Supple, no jugular venous distention. No thyroid enlargement, no tenderness.  LUNGS: Normal breath sounds bilaterally, no wheezing, + dry rales b/l, no rhonchi. + use of accessory muscles of respiration.  CARDIOVASCULAR: S1, S2 Irregular. No murmurs, rubs, or gallops.  ABDOMEN: Soft, nontender, nondistended. Bowel sounds present. No organomegaly or mass.  EXTREMITIES: No cyanosis, clubbing or edema b/l.    NEUROLOGIC: Cranial nerves II through XII are intact. No focal Motor or sensory deficits b/l.   PSYCHIATRIC: The patient is alert and oriented x 3.  SKIN: No obvious rash, lesion, or ulcer.    LABORATORY PANEL:   CBC Recent Labs  Lab 04/12/18 0212  WBC 17.7*  HGB 12.1  HCT 36.5  PLT 396   ------------------------------------------------------------------------------------------------------------------  Chemistries  Recent Labs  Lab 04/07/18 2322  04/12/18 0212  NA 135   < > 131*  K 3.0*   < > 4.5  CL 90*   < > 90*  CO2 36*   < > 31  GLUCOSE 111*   < > 127*  BUN 7*   < > 12  CREATININE 0.54   < > 0.70  CALCIUM 8.8*   < > 9.5  MG 2.0   < > 1.9  AST 24  --   --   ALT 13  --   --   ALKPHOS 74  --   --   BILITOT 0.8  --   --    < > = values in this interval not displayed.   ------------------------------------------------------------------------------------------------------------------  Cardiac Enzymes Recent Labs  Lab 04/07/18 2322  TROPONINI <0.03   ------------------------------------------------------------------------------------------------------------------  RADIOLOGY:  Dg Thoracic Spine 2 View  Result Date: 04/12/2018 CLINICAL DATA:  Portable imaging provided for kyphoplasty. EXAM: DG C-ARM 61-120 MIN; THORACIC SPINE 2 VIEWS COMPARISON:  The thoracic MRI, 04/10/2018 FINDINGS: Imaging shows placement of balloons followed by  kyphoplasty cement in what are presumed to be the T7 and T8 vertebra, above a previous, T9 vertebral fracture at has been previously treated with vertebroplasty/kyphoplasty. No evidence of a procedure complication on the provided images. IMPRESSION: Portable imaging provided for thoracic spine kyphoplasty. Electronically Signed   By: Lajean Manes M.D.   On: 04/12/2018 14:14   Dg C-arm 1-60 Min  Result Date: 04/12/2018 CLINICAL DATA:  Portable imaging provided for kyphoplasty. EXAM: DG C-ARM 61-120 MIN; THORACIC SPINE 2 VIEWS COMPARISON:  The thoracic MRI, 04/10/2018 FINDINGS: Imaging shows placement of balloons followed by kyphoplasty cement in what are presumed to be the T7 and T8 vertebra, above a previous, T9 vertebral fracture at has been previously treated with vertebroplasty/kyphoplasty. No evidence of a procedure complication on the provided images. IMPRESSION: Portable imaging provided for thoracic spine kyphoplasty. Electronically Signed   By: Lajean Manes M.D.   On: 04/12/2018 14:14     ASSESSMENT AND PLAN:   73 year old female with past medical history of chronic respiratory failure secondary to bronchiectasis, back pain secondary to compression fracture status post recent kyphoplasty, history of peptic ulcer disease, hypertension, GERD, coronary disease, atrial fibrillation who presents to the hospital due to intractable nausea vomiting and abdominal pain.  1.  Intractable nausea vomiting abdominal pain-etiology unclear, patient CT abdomen pelvis was negative for acute pathology.  Patient LFTs are stable lipase is normal.  Suspect this is likely gastritis/GERD.   - continue PPI, Maalox and supportive care.   2.  Back pain- patient is status post recent kyphoplasty a few days back.  She continues to complain of significant back pain.   Continue supportive care with pain control with IV Dilaudid ordered. T7-T8 acute or subacute compression per MRI.  Dr. Rudene Christians will do kyphoplasty today.   3.  Atrial fibrillation with rapid ventricular response- much improved and off Cardizem drip. -Continue oral Cardizem, Toprol.  Appreciate cardiology input.   Hold Eliquis for 48 hours for kyphoplasty.  4.  Shortness of breath- patient has chronic shortness of breath due to her bronchiectasis.  She is not significantly hypoxic.  Continue supportive care O2 supplementation for now.    5.  Hypokalemia-secondary to the nausea vomiting. -Improved with supplement.  6.  Essential hypertension-continue losartan, Toprol, Norvasc.  7.  GERD- continue Protonix, Carafate   PT suggest nursing home placement.  The patient also wants to go home with HHPT.  Hyponatremia. F/u Na. All the records are reviewed and case discussed with Care Management/Social Worker. Management plans discussed with the patient, her sister and they are in agreement.  CODE STATUS: Full code  DVT Prophylaxis: Eliquis  TOTAL TIME TAKING CARE OF THIS PATIENT: 26 minutes.   POSSIBLE D/C IN 1-2 DAYS, DEPENDING ON CLINICAL CONDITION.   Demetrios Loll M.D on 04/12/2018 at 3:08 PM  Between 7am to 6pm - Pager - [phone]  After 6pm go to [email or web address] - Proofreader  [facility]  Hospital[facility]  Office  [phone]  CC: Primary care physician; [name], MD

## 2018-04-12 NOTE — Op Note (Signed)
Date April 12, 2018  time 1:50 PM   PATIENT:  Ana Washington   PRE-OPERATIVE DIAGNOSIS:  closed wedge compression fracture of T7 and T8   POST-OPERATIVE DIAGNOSIS:  closed wedge compression fracture of T7 and T8   PROCEDURE:  Procedure(s): KYPHOPLASTY T7 and T8  SURGEON: Laurene Footman, MD   ASSISTANTS: None   ANESTHESIA:   local and MAC   EBL:  No intake/output data recorded.   BLOOD ADMINISTERED:none   DRAINS: none    LOCAL MEDICATIONS USED:  MARCAINE    and XYLOCAINE    SPECIMEN:   T8 vertebral body biopsy obtained T7 was not   DISPOSITION OF SPECIMEN:  Pathology   COUNTS:  YES   TOURNIQUET:  * No tourniquets in log *   IMPLANTS: Bone cement   DICTATION: .Dragon Dictation  patient was brought to the operating room and after adequate anesthesia was obtained the patient was placed prone.  C arm was brought in in good visualization of the affected levels obtained on both AP and lateral projections.  After patient identification and timeout procedures were completed, local anesthetic was infiltrated with 10 cc 1% Xylocaine infiltrated subcutaneously.  This is done the area on the both sides of the planned approach.  The back was then prepped and draped in the usual sterile manner and repeat timeout procedure carried out.  A spinal needle was brought down to the pedicle on the right side of  T7 and T8 and a 50-50 mix of 1% Xylocaine half percent Sensorcaine with epinephrine total of 20 cc injected.  After allowing this to set a small incision was made and the trocar was advanced into the vertebral body in an extrapedicular fashion.  Biopsy was obtained at T8 but not at T7 Drilling was carried out balloon inserted with inflation to  1.5 cc at T8 7 and 2 cc at T8.  When the cement was appropriate consistency 2 cc at T8 7 and 3 cc at T8 were injected into the vertebral body without extravasation, good fill superior to inferior endplates and from right to left sides along the inferior  endplate.  After the cement had set the trochar was removed and permanent C-arm views obtained.  The wound was closed with Dermabond followed by Band-Aid   PLAN OF CARE:  Continue as inpatient   PATIENT DISPOSITION:  PACU - hemodynamically stable.

## 2018-04-12 NOTE — OR Nursing (Signed)
Labetalol dose 5 mg every 5 min for 5 doses as needed starting at 14:10

## 2018-04-13 LAB — BASIC METABOLIC PANEL
Anion gap: 10 (ref 5–15)
BUN: 15 mg/dL (ref 8–23)
CHLORIDE: 91 mmol/L — AB (ref 98–111)
CO2: 32 mmol/L (ref 22–32)
Calcium: 9.4 mg/dL (ref 8.9–10.3)
Creatinine, Ser: 0.73 mg/dL (ref 0.44–1.00)
GFR calc Af Amer: >60 mL/min (ref >60)
GFR calc non Af Amer: >60 mL/min (ref >60)
Glucose, Bld: 88 mg/dL (ref 70–99)
Potassium: 3.9 mmol/L (ref 3.5–5.1)
Sodium: 133 mmol/L — ABNORMAL LOW (ref 135–145)

## 2018-04-13 LAB — CBC
HEMATOCRIT: 35.1 % — AB (ref 36.0–46.0)
HEMOGLOBIN: 11.7 g/dL — AB (ref 12.0–15.0)
MCH: 33.7 pg (ref 26.0–34.0)
MCHC: 33.3 g/dL (ref 30.0–36.0)
MCV: 101.2 fL — ABNORMAL HIGH (ref 80.0–100.0)
Platelets: 347 10*3/uL (ref 150–400)
RBC: 3.47 MIL/uL — ABNORMAL LOW (ref 3.87–5.11)
RDW: 11.7 % (ref 11.5–15.5)
WBC: 16.5 10*3/uL — ABNORMAL HIGH (ref 4.0–10.5)
nRBC: 0 % (ref 0.0–0.2)

## 2018-04-13 LAB — SURGICAL PATHOLOGY

## 2018-04-13 LAB — MAGNESIUM: Magnesium: 1.9 mg/dL (ref 1.7–2.4)

## 2018-04-13 MED ORDER — IPRATROPIUM-ALBUTEROL 0.5-2.5 (3) MG/3ML IN SOLN
3.0000 mL | Freq: Four times a day (QID) | RESPIRATORY_TRACT | Status: DC | PRN
  Administered 2018-04-13: 3 mL via RESPIRATORY_TRACT
  Filled 2018-04-13: qty 3

## 2018-04-13 MED ORDER — POTASSIUM CHLORIDE CRYS ER 20 MEQ PO TBCR
20.0000 meq | EXTENDED_RELEASE_TABLET | Freq: Once | ORAL | Status: AC
  Administered 2018-04-13: 08:00:00 20 meq via ORAL
  Filled 2018-04-13: qty 1

## 2018-04-13 MED ORDER — IPRATROPIUM-ALBUTEROL 0.5-2.5 (3) MG/3ML IN SOLN
3.0000 mL | Freq: Three times a day (TID) | RESPIRATORY_TRACT | Status: DC
  Administered 2018-04-13 – 2018-04-14 (×3): 3 mL via RESPIRATORY_TRACT
  Filled 2018-04-13 (×3): qty 3

## 2018-04-13 MED ORDER — AZITHROMYCIN 250 MG PO TABS
250.0000 mg | ORAL_TABLET | Freq: Every day | ORAL | Status: DC
  Administered 2018-04-13 – 2018-04-14 (×2): 250 mg via ORAL
  Filled 2018-04-13 (×2): qty 1

## 2018-04-13 MED ORDER — MAGNESIUM SULFATE IN D5W 1-5 GM/100ML-% IV SOLN
1.0000 g | Freq: Once | INTRAVENOUS | Status: AC
  Administered 2018-04-13: 09:00:00 1 g via INTRAVENOUS
  Filled 2018-04-13: qty 100

## 2018-04-13 MED ORDER — DILTIAZEM HCL 60 MG PO TABS: 60.0000 mg | ORAL_TABLET | Freq: Two times a day (BID) | ORAL | 1 refills | Status: DC

## 2018-04-13 NOTE — Progress Notes (Signed)
OT Cancellation Note  Patient Details Name: Ana Washington MRN: [id number] DOB: [birth year]   Cancelled Treatment:    Reason Eval/Treat Not Completed: Fatigue/lethargy limiting ability to participate;Patient at procedure or test/ unavailable(OT services attempted x's 2 this a.m. 1st attempt pt. in nursing care, having toileting care needs met. 2nd attempt: pt. very lethargic after walking. Will reattempt OT initial visit at a later time/date.)  Harrel Carina, MS, OTR/L 04/13/2018, 11:52 AM

## 2018-04-13 NOTE — Progress Notes (Addendum)
Pharmacy Electrolyte Monitoring Consult:  Pharmacy consulted to assist in monitoring and replacing electrolytes in this 73 y.o. female admitted on 04/07/2018 with Abdominal Pain and Nausea   Labs:  Sodium (mmol/L)  Date Value  04/13/2018 133 (L)  03/01/2018 138   Potassium (mmol/L)  Date Value  04/13/2018 3.9   Magnesium (mg/dL)  Date Value  04/13/2018 1.9   Phosphorus (mg/dL)  Date Value  04/09/2018 3.0   Calcium (mg/dL)  Date Value  04/13/2018 9.4   Albumin (g/dL)  Date Value  04/07/2018 3.5  03/01/2018 3.8    Assessment/Plan: 3/13 K: 3.6, Mg: 1.9. Patient has KCL 39mEq QD ordered. Will order KCL 22mEq x 1 dose and Magnesium 1g IV x 1 dose.    No additional replacement needed at this time.   Will continue to replace for goal potassium ~ 4 and goal magnesium ~ 2.   Plan to F/U with AM labs and continue to monitor and adjust per consult.   Pernell Dupre, PharmD, BCPS Clinical Pharmacist 04/13/2018 7:07 AM

## 2018-04-13 NOTE — Evaluation (Signed)
Physical Therapy Evaluation Patient Details Name: Ana Washington MRN: [id number] DOB: 1945-11-13 Today's Date: 04/13/2018   History of Present Illness  Pt is a 73 year old female admitted with T10 compression fracture.  Kyphoplasty performed 3/8.  Pt experienced a-fib and RVR; rapid response called and pt trasferred to ICU.  Compression fractures in T7/8 found on MRI and second kyphoplasty scheduled/completed 04/12/2018.  PMH includes bronchiectasis, atrial fibrillation, dumping syndrome and arthritis.  Clinical Impression  Patient on [facility] upon arrival to room; assisted with hygiene, clothing management and transfer off of BSC as appropriate.  Reporting significant improvements in pain control at this time, but states generally fatigued.  Currently able to complete bed mobility with sup/mod indep; sit/stand, basic transfers and gait (100') with RW, cga/min assist.  Demonstrates forward flexed posture, RW arms length anterior to patient; reciprocal stepping pattern with slow,guarded, stepping pattern.  Mod SOB with exertion, requiring 2 standing rest breaks throughout distance (sats >90% on 2L) Would benefit from skilled PT to address above deficits and promote optimal return to PLOF; Recommend transition to [place] upon discharge from acute hospitalization.     Follow Up Recommendations Home health PT    Equipment Recommendations  (has necessary equipment)    Recommendations for Other Services       Precautions / Restrictions Precautions Precautions: Back;Fall Restrictions Weight Bearing Restrictions: No      Mobility  Bed Mobility Overal bed mobility: Needs Assistance Bed Mobility: Supine to Sit;Sit to Supine     Supine to sit: Supervision Sit to supine: Modified independent (Device/Increase time)      Transfers Overall transfer level: Needs assistance Equipment used: Rolling walker (2 wheeled) Transfers: Sit to/from Stand Sit to Stand: Min guard         General transfer  comment: cuing for hand placement, postural extension  Ambulation/Gait Ambulation/Gait assistance: Min guard Gait Distance (Feet): 100 Feet Assistive device: Rolling walker (2 wheeled)       General Gait Details: forward flexed posture, RW arms length anterior to patient; reciprocal stepping pattern with slow,guarded, stepping pattern.  Mod SOB with exertion, requiring 2 standing rest breaks throughout distance (sats >90% on 2L)  Stairs            Wheelchair Mobility    Modified Rankin (Stroke Patients Only)       Balance Overall balance assessment: Needs assistance Sitting-balance support: No upper extremity supported;Feet supported Sitting balance-Leahy Scale: Good     Standing balance support: Bilateral upper extremity supported Standing balance-Leahy Scale: Fair                               Pertinent Vitals/Pain Pain Assessment: No/denies pain    Home Living Family/patient expects to be discharged to:: Private residence Living Arrangements: Spouse/significant other Available Help at Discharge: Family;Personal care attendant;Available PRN/intermittently Type of Home: House Home Access: Ramped entrance     Home Layout: One level        Prior Function Level of Independence: Needs assistance   Gait / Transfers Assistance Needed: Has been using RW for mobility, hasn't left her home except for appointments in two years.  ADL's / Homemaking Assistance Needed: Aid assists with ADL's.  Pt's husband is also very limited in mobility.        Hand Dominance        Extremity/Trunk Assessment   Upper Extremity Assessment Upper Extremity Assessment: Generalized weakness    Lower Extremity Assessment  Lower Extremity Assessment: Generalized weakness(grossly 4-/5 throughout; denies radicular symptoms)       Communication   Communication: No difficulties  Cognition Arousal/Alertness: Awake/alert Behavior During Therapy: WFL for tasks  assessed/performed Overall Cognitive Status: Within Functional Limits for tasks assessed                                        General Comments      Exercises Other Exercises Other Exercises: Toilet transfer, SPT with RW, cga/min assist; dep for hygiene; stnading balance for clothing management, cga with RW   Assessment/Plan    PT Assessment Patient needs continued PT services  PT Problem List Decreased strength;Decreased mobility;Decreased balance;Pain;Decreased activity tolerance       PT Treatment Interventions Therapeutic activities;DME instruction;Gait training;Therapeutic exercise;Stair training;Balance training;Functional mobility training;Patient/family education    PT Goals (Current goals can be found in the Care Plan section)  Acute Rehab PT Goals Patient Stated Goal: to go home PT Goal Formulation: With patient Time For Goal Achievement: 04/27/18 Potential to Achieve Goals: Good    Frequency 7X/week   Barriers to discharge        Co-evaluation               AM-PAC PT "6 Clicks" Mobility  Outcome Measure Help needed turning from your back to your side while in a flat bed without using bedrails?: None Help needed moving from lying on your back to sitting on the side of a flat bed without using bedrails?: A Little Help needed moving to and from a bed to a chair (including a wheelchair)?: A Little Help needed standing up from a chair using your arms (e.g., wheelchair or bedside chair)?: A Little Help needed to walk in hospital room?: A Little Help needed climbing 3-5 steps with a railing? : A Little 6 Click Score: 19    End of Session Equipment Utilized During Treatment: Gait belt;Oxygen Activity Tolerance: Patient tolerated treatment well;Patient limited by fatigue Patient left: in bed;with call bell/phone within reach;with bed alarm set;with family/visitor present Nurse Communication: Mobility status PT Visit Diagnosis: Muscle  weakness (generalized) (M62.81);Unsteadiness on feet (R26.81);Pain    Time: 7209-4709 PT Time Calculation (min) (ACUTE ONLY): 25 min   Charges:   PT Evaluation $PT Re-evaluation: 1 Re-eval PT Treatments $Therapeutic Activity: 8-22 mins       [place] H. Owens Shark, PT, DPT, NCS 04/13/18, 7:24 PM [phone]

## 2018-04-13 NOTE — Discharge Instructions (Signed)
HHPT, fall precaution. °

## 2018-04-13 NOTE — Discharge Summary (Addendum)
[facility] [place] NAME: Ana Washington    MR#:  [id number]  DATE OF BIRTH:  [birth year]  DATE OF ADMISSION:  04/07/2018   ADMITTING PHYSICIAN: Harrie Foreman, MD  DATE OF DISCHARGE: 04/14/2018  PRIMARY CARE PHYSICIAN: [name], MD   ADMISSION DIAGNOSIS:  Hiatal hernia [K44.9] Generalized abdominal pain [R10.84] Post-operative pain [G89.18] Intractable nausea and vomiting [R11.2] DISCHARGE DIAGNOSIS:  Active Problems:   Intractable nausea and vomiting   Atrial fibrillation with RVR ([place])  SECONDARY DIAGNOSIS:   Past Medical History:  Diagnosis Date  . Arthritis   . Atrial fibrillation ([place])   . Bronchiectasis ([place])   . CAD (coronary artery disease)   . CAD (coronary artery disease) 07/30/2017  . Dumping syndrome   . Dyspnea   . Dysrhythmia    afib  . 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 Family history of adverse reaction to anesthesia    sister [name]  . GERD (gastroesophageal reflux disease)   . Headache    MIGRAINES  . Hypertension   . Lung disease   . Myocardial infarction ([place]) 2007   Non-STEMI  . [name] (postoperative nausea and vomiting)   . Psoriasis   . PUD (peptic ulcer disease)    HOSPITAL COURSE:  73 year old female with past medical history of chronic respiratory failure secondary to bronchiectasis, back pain secondary to compression fracture status post recent kyphoplasty, history of peptic ulcer disease, hypertension, GERD, coronary disease, atrial fibrillation who presents to the hospital due to intractable nausea vomiting and abdominal pain.  1.  Intractable nausea vomiting abdominal pain-this was secondary to gastritis/GERD. -Patient CT abdomen pelvis on admission was negative for acute pathology.  Patient was treated supportively with IV fluids, Maalox and Protonix and has improved. -Patient is being discharged on Protonix.  2.  Back pain-  patient had recent kyphoplasty prior to  admission but continued to complain of significant back pain.  A orthopedic consult obtained.  Patient was seen by Dr. Rudene Christians, MRI of the thoracic spine showed T7-T8 compression fracture with nerve impingement.  Patient underwent kyphoplasty at T7-T8 area and is postop day #2 today.  Pain is well controlled.  She is being discharged home with home health services.  3.  Atrial fibrillation with rapid ventricular response- was transferred to ICU on a Cardizem drip but during the hospital course but quickly weaned off of it.  Currently heart rates are well controlled on oral Cardizem and metoprolol which she will continue. -Patient will resume her Eliquis.    4.    Chronic respiratory failure, shortness of breath- patient has chronic shortness of breath due to her bronchiectasis.  She is not significantly hypoxic.  Continue supportive care O2 supplementation for now.    Continue home long-term antibiotics.  Follow-up [facility] physician.  5.  Hypokalemia- resolved with supplementation.   6.  Essential hypertension- continue losartan, Toprol, Norvasc.  7.  GERD- continue Protonix, Carafate   She is being discharged home with hospice and they are to provide physical therapy and OT at home.  DISCHARGE CONDITIONS:   Stable  CONSULTS OBTAINED:  Treatment Team:  Teodoro Spray, MD Hessie Knows, MD DRUG ALLERGIES:   Allergies  Allergen Reactions  . Sulfa Antibiotics Diarrhea  . Codeine Nausea And Vomiting  . Penicillins Rash    Has patient had a PCN reaction causing immediate rash, facial/tongue/throat swelling, SOB or lightheadedness with hypotension: Unknown Has patient had a PCN reaction causing severe rash  involving mucus membranes or skin necrosis: Unknown Has patient had a PCN reaction that required hospitalization: Unknown Has patient had a PCN reaction occurring within the last 10 years: No If all of the above answers are "NO", then may proceed with Cephalosporin use.    DISCHARGE  MEDICATIONS:   Allergies as of 04/13/2018      Reactions   Sulfa Antibiotics Diarrhea   Codeine Nausea And Vomiting   Penicillins Rash   Has patient had a PCN reaction causing immediate rash, facial/tongue/throat swelling, SOB or lightheadedness with hypotension: Unknown Has patient had a PCN reaction causing severe rash involving mucus membranes or skin necrosis: Unknown Has patient had a PCN reaction that required hospitalization: Unknown Has patient had a PCN reaction occurring within the last 10 years: No If all of the above answers are "NO", then may proceed with Cephalosporin use.      Medication List    TAKE these medications   acetaminophen 500 MG tablet Commonly known as:  TYLENOL Take 1,000 mg by mouth every 6 (six) hours as needed for mild pain or headache.   Align 4 MG Caps Take 4 mg by mouth daily.   amLODipine 5 MG tablet Commonly known as:  NORVASC Take 5 mg by mouth daily.   apixaban 5 MG Tabs tablet Commonly known as:  ELIQUIS Take 5 mg by mouth 2 (two) times daily.   aspirin EC 81 MG tablet Take 81 mg by mouth daily.   azithromycin 250 MG tablet Commonly known as:  ZITHROMAX Take 250 mg by mouth daily.   chlorpheniramine-HYDROcodone 10-8 MG/5ML Suer Commonly known as:  TUSSIONEX Take 6 mLs by mouth at bedtime as needed for cough.   ciprofloxacin 500 MG tablet Commonly known as:  CIPRO Take 500 mg by mouth 2 (two) times daily.   diltiazem 60 MG tablet Commonly known as:  CARDIZEM Take 1 tablet (60 mg total) by mouth every 12 (twelve) hours. What changed:    medication strength  how much to take  when to take this  additional instructions   ferrous sulfate 325 (65 FE) MG EC tablet Take 325 mg by mouth daily.   gabapentin 300 MG capsule Commonly known as:  NEURONTIN Take 300 mg by mouth 4 (four) times daily.   LORazepam 0.5 MG tablet Commonly known as:  ATIVAN Take 0.5 mg by mouth every 4 (four) hours.   losartan 100 MG tablet  Commonly known as:  COZAAR Take 100 mg by mouth daily.   metoprolol succinate 50 MG 24 hr tablet Commonly known as:  TOPROL-XL Take 50 mg by mouth at bedtime.   multivitamin with minerals Tabs tablet Take 1 tablet by mouth daily.   Oyster Shell/Vitamin D 600-125 MG-UNIT Tabs Take 1 tablet by mouth daily.   potassium chloride 10 MEQ tablet Commonly known as:  K-DUR Take 10 mEq by mouth daily.   predniSONE 10 MG tablet Commonly known as:  DELTASONE TAKE 1 TABLET(10 MG) BY MOUTH DAILY WITH BREAKFAST What changed:  See the new instructions.   PriLOSEC OTC 20 MG tablet Generic drug:  omeprazole Take 40 mg by mouth daily.   Sodium Chloride (Inhalant) 7 % Nebu Inhale 4 mLs into the lungs 3 (three) times daily.   sucralfate 1 g tablet Commonly known as:  CARAFATE Take 1 g by mouth 4 (four) times daily.   traMADol 50 MG tablet Commonly known as:  Ultram Take 1 tablet (50 mg total) by mouth every 6 (six) hours as  needed.   vitamin C 1000 MG tablet Take 1,000 mg by mouth daily.   Vitamin D-1000 Max St 25 MCG (1000 UT) tablet Generic drug:  Cholecalciferol Take 1,000 Units by mouth daily.        DISCHARGE INSTRUCTIONS:  See AVS. If you experience worsening of your admission symptoms, develop shortness of breath, life threatening emergency, suicidal or homicidal thoughts you must seek medical attention immediately by calling 911 or calling your MD immediately  if symptoms less severe.  You Must read complete instructions/literature along with all the possible adverse reactions/side effects for all the Medicines you take and that have been prescribed to you. Take any new Medicines after you have completely understood and accpet all the possible adverse reactions/side effects.   Please note  You were cared for by a hospitalist during your hospital stay. If you have any questions about your discharge medications or the care you received while you were in the hospital after  you are discharged, you can call the unit and asked to speak with the hospitalist on call if the hospitalist that took care of you is not available. Once you are discharged, your primary care physician will handle any further medical issues. Please note that NO REFILLS for any discharge medications will be authorized once you are discharged, as it is imperative that you return to your primary care physician (or establish a relationship with a primary care physician if you do not have one) for your aftercare needs so that they can reassess your need for medications and monitor your lab values.    On the day of Discharge:   Patient says she did not sleep well last night.  Back pain has improved and is tolerable.  Heart rates are stable.  Will discharge home with hospice services today.  VITAL SIGNS:  Blood pressure 134/83, pulse 72, temperature 97.8 F (36.6 C), temperature source Oral, resp. rate 15, height 5' (1.524 m), weight 51.3 kg, SpO2 100 %. PHYSICAL EXAMINATION:   GENERAL:  73 y.o.-year-old thin patient lying in bed in NAD EYES: Pupils equal, round, reactive to light and accommodation. No scleral icterus. Extraocular muscles intact.  HEENT: Head atraumatic, normocephalic. Oropharynx and nasopharynx clear.  NECK:  Supple, no jugular venous distention. No thyroid enlargement, no tenderness.  LUNGS: Normal breath sounds bilaterally, no wheezing, + dry rales b/l, no rhonchi. + use of accessory muscles of respiration.  CARDIOVASCULAR: S1, S2 Irregular. No murmurs, rubs, or gallops.  ABDOMEN: Soft, nontender, nondistended. Bowel sounds present. No organomegaly or mass.  EXTREMITIES: No cyanosis, clubbing or edema b/l.    NEUROLOGIC: Cranial nerves II through XII are intact. No focal Motor or sensory deficits b/l.   PSYCHIATRIC: The patient is alert and oriented x 3.  SKIN: No obvious rash, lesion, or ulcer.    DATA REVIEW:   CBC Recent Labs  Lab 04/13/18 0304  WBC 16.5*  HGB 11.7*   HCT 35.1*  PLT 347    Chemistries  Recent Labs  Lab 04/07/18 2322  04/13/18 0304  NA 135   < > 133*  K 3.0*   < > 3.9  CL 90*   < > 91*  CO2 36*   < > 32  GLUCOSE 111*   < > 88  BUN 7*   < > 15  CREATININE 0.54   < > 0.73  CALCIUM 8.8*   < > 9.4  MG 2.0   < > 1.9  AST 24  --   --  ALT 13  --   --   ALKPHOS 74  --   --   BILITOT 0.8  --   --    < > = values in this interval not displayed.     Microbiology Results  Results for orders placed or performed during the hospital encounter of 04/07/18  MRSA PCR Screening     Status: None   Collection Time: 04/08/18  5:14 PM  Result Value Ref Range Status   MRSA by PCR NEGATIVE NEGATIVE Final    Comment:        The GeneXpert MRSA Assay (FDA approved for NASAL specimens only), is one component of a comprehensive MRSA colonization surveillance program. It is not intended to diagnose MRSA infection nor to guide or monitor treatment for MRSA infections. Performed at [facility], [address], [place], [place] [address]     RADIOLOGY:  Dg Thoracic Spine 2 View  Result Date: 04/12/2018 CLINICAL DATA:  Portable imaging provided for kyphoplasty. EXAM: DG C-ARM 61-120 MIN; THORACIC SPINE 2 VIEWS COMPARISON:  The thoracic MRI, 04/10/2018 FINDINGS: Imaging shows placement of balloons followed by kyphoplasty cement in what are presumed to be the T7 and T8 vertebra, above a previous, T9 vertebral fracture at has been previously treated with vertebroplasty/kyphoplasty. No evidence of a procedure complication on the provided images. IMPRESSION: Portable imaging provided for thoracic spine kyphoplasty. Electronically Signed   By: Lajean Manes M.D.   On: 04/12/2018 14:14   Dg C-arm 1-60 Min  Result Date: 04/12/2018 CLINICAL DATA:  Portable imaging provided for kyphoplasty. EXAM: DG C-ARM 61-120 MIN; THORACIC SPINE 2 VIEWS COMPARISON:  The thoracic MRI, 04/10/2018 FINDINGS: Imaging shows placement of balloons followed by  kyphoplasty cement in what are presumed to be the T7 and T8 vertebra, above a previous, T9 vertebral fracture at has been previously treated with vertebroplasty/kyphoplasty. No evidence of a procedure complication on the provided images. IMPRESSION: Portable imaging provided for thoracic spine kyphoplasty. Electronically Signed   By: Lajean Manes M.D.   On: 04/12/2018 14:14     Management plans discussed with the patient, sister and they are in agreement.  CODE STATUS: DNR   TOTAL TIME TAKING CARE OF THIS PATIENT: 40 minutes.    Henreitta Leber M.D on 04/13/2018 at 11:04 AM  Between 7am to 6pm - Pager - [phone]  After 6pm go to [email or web address] - Proofreader  [facility]  Office  [phone]  CC: Primary care physician; [name], MD   Note: This dictation was prepared with Dragon dictation along with smaller phrase technology. Any transcriptional errors that result from this process are unintentional.

## 2018-04-13 NOTE — Anesthesia Postprocedure Evaluation (Signed)
Anesthesia Post Note  Patient: Ana Washington  Procedure(s) Performed: KYPHOPLASTY T7,8 (N/A )  Patient location during evaluation: PACU Anesthesia Type: MAC Level of consciousness: awake and alert Pain management: pain level controlled Vital Signs Assessment: post-procedure vital signs reviewed and stable Respiratory status: spontaneous breathing, nonlabored ventilation, respiratory function stable and patient connected to nasal cannula oxygen Cardiovascular status: blood pressure returned to baseline and stable Postop Assessment: no apparent nausea or vomiting Anesthetic complications: no     Last Vitals:  Vitals:   04/12/18 2030 04/13/18 0345  BP: (!) 146/97 134/83  Pulse: 86 72  Resp: (!) 22 15  Temp: 36.6 C 36.6 C  SpO2: 96% 100%    Last Pain:  Vitals:   04/13/18 0823  TempSrc:   PainSc: 5                  Molli Barrows

## 2018-04-14 LAB — BASIC METABOLIC PANEL
Anion gap: 9 (ref 5–15)
BUN: 13 mg/dL (ref 8–23)
CO2: 31 mmol/L (ref 22–32)
Calcium: 9.2 mg/dL (ref 8.9–10.3)
Chloride: 91 mmol/L — ABNORMAL LOW (ref 98–111)
Creatinine, Ser: 0.65 mg/dL (ref 0.44–1.00)
GFR calc Af Amer: >60 mL/min (ref >60)
GFR calc non Af Amer: >60 mL/min (ref >60)
GLUCOSE: 82 mg/dL (ref 70–99)
Potassium: 3.2 mmol/L — ABNORMAL LOW (ref 3.5–5.1)
Sodium: 131 mmol/L — ABNORMAL LOW (ref 135–145)

## 2018-04-14 LAB — MAGNESIUM: Magnesium: 1.7 mg/dL (ref 1.7–2.4)

## 2018-04-14 MED ORDER — POTASSIUM CHLORIDE CRYS ER 20 MEQ PO TBCR
20.0000 meq | EXTENDED_RELEASE_TABLET | ORAL | Status: AC
  Administered 2018-04-14 (×2): 20 meq via ORAL
  Filled 2018-04-14 (×2): qty 1

## 2018-04-14 MED ORDER — HALOPERIDOL LACTATE 5 MG/ML IJ SOLN
2.0000 mg | Freq: Once | INTRAMUSCULAR | Status: AC
  Administered 2018-04-14: 02:00:00 2 mg via INTRAVENOUS
  Filled 2018-04-14: qty 1

## 2018-04-14 MED ORDER — PANTOPRAZOLE SODIUM 40 MG PO TBEC: 40.0000 mg | DELAYED_RELEASE_TABLET | Freq: Every day | ORAL | 1 refills | Status: DC

## 2018-04-14 MED ORDER — MAGNESIUM SULFATE 2 GM/50ML IV SOLN
2.0000 g | Freq: Once | INTRAVENOUS | Status: AC
  Administered 2018-04-14: 09:00:00 2 g via INTRAVENOUS
  Filled 2018-04-14: qty 50

## 2018-04-14 NOTE — Evaluation (Addendum)
Occupational Therapy Evaluation Patient Details Name: Ana Washington MRN: [id number] DOB: 23-Jul-1945 Today's Date: 04/14/2018    History of Present Illness Pt is a 73 year old female admitted with T10 compression fracture.  Kyphoplasty performed 3/8.  Pt experienced a-fib and RVR; rapid response called and pt trasferred to ICU.  Compression fractures in T7/8 found on MRI and second kyphoplasty scheduled/completed 04/12/2018.  PMH includes bronchiectasis, atrial fibrillation, dumping syndrome and arthritis.   Clinical Impression   Pt seen for OT evaluation this date. Upon arrival to room, pt awake in bed with daughter at bedside. Pt complained of poor sleep the night before and feeling symptoms of "restless leg syndrome" requesting ativan. RN notified after session. Prior to hospital admission, pt utilized a RW for home distances and endorsed requiring help from a PCA for ADLs at baseline. Pt lives with spouse in a 1 level single family home with a ramped entrance. Pt able to don LB/UB clothes during evaluation as well as complete bed mobility and functional transfers in room with modified independence. Pt required consistent VCs for maintaining back precautions. Pt educated in self care skills, safe use of AE, and home/routines modifications to maximize safety and functional independence while minimizing falls risk and maintaining precautions. Pt verbalized understanding of all education/training provided. No additional skilled OT needs at this time. Will discharge in house. Please re-consult if additional needs arise during admission. Upon hospital discharge, recommend HHOT to maximize safety, return to PLOF, and reduce risk of falls in the home.     Follow Up Recommendations  Home health OT    Equipment Recommendations  (Pt has necessary equipment. )    Recommendations for Other Services       Precautions / Restrictions Precautions Precautions: Back;Fall Precaution Booklet Issued:  No Restrictions Weight Bearing Restrictions: No      Mobility Bed Mobility Overal bed mobility: Needs Assistance Bed Mobility: Supine to Sit;Sit to Supine     Supine to sit: Supervision Sit to supine: Modified independent (Device/Increase time)   General bed mobility comments: Pt with difficulty following back precautions. Required heavy VCs for log roll technique. Otherwise able to complete bed mobility with supervision/modified independence.   Transfers Overall transfer level: Needs assistance Equipment used: Rolling walker (2 wheeled) Transfers: Sit to/from Stand Sit to Stand: Supervision         General transfer comment: Pt endorsed ambulating in room (to get to [facility]) independently. Provided education on pt safety in hospital and encouraged RW use with assistance for any OOB activity.     Balance Overall balance assessment: Needs assistance Sitting-balance support: No upper extremity supported;Feet supported Sitting balance-Leahy Scale: Good     Standing balance support: Bilateral upper extremity supported Standing balance-Leahy Scale: Fair                             ADL either performed or assessed with clinical judgement   ADL Overall ADL's : At baseline Eating/Feeding: Independent;Supervision/ safety   Grooming: Set up;Independent;Supervision/safety   Upper Body Bathing: Set up;Minimal assistance;Supervision/ safety   Lower Body Bathing: Set up;Supervison/ safety;Minimal assistance;Cueing for back precautions   Upper Body Dressing : Set up;Supervision/safety   Lower Body Dressing: Set up;Supervision/safety;Cueing for back precautions   Toilet Transfer: Set up;BSC;Ambulation;Independent;RW Toilet Transfer Details (indicate cue type and reason): Pt endorsed independently getting to [facility] in room. OT educated pt on continued use of RW and use of call bell for OOB assist  for falls prevention. Toileting- Clothing Manipulation and Hygiene: Set  up;Supervision/safety;Cueing for back precautions   Tub/ Shower Transfer: Set up;Min guard;Shower seat;Ambulation;Adhering to back precautions   Functional mobility during ADLs: Supervision/safety;Rolling walker General ADL Comments: Pt moving very well at time of evaluation. Able to don UB/LB clothing including socks independently with supervision for safety. Required reinforcement of back precautions and falls prevention strategies within the hospital during functional mobility.      Vision Baseline Vision/History: Wears glasses Wears Glasses: Reading only Patient Visual Report: No change from baseline       Perception     Praxis      Pertinent Vitals/Pain Pain Score: 6  Pain Location: Spine Pain Descriptors / Indicators: Sore Pain Intervention(s): Limited activity within patient's tolerance;Monitored during session;Repositioned;Patient requesting pain meds-RN notified     Hand Dominance Right   Extremity/Trunk Assessment Upper Extremity Assessment Upper Extremity Assessment: Generalized weakness   Lower Extremity Assessment Lower Extremity Assessment: Generalized weakness   Cervical / Trunk Assessment Cervical / Trunk Assessment: Kyphotic   Communication Communication Communication: No difficulties   Cognition Arousal/Alertness: Awake/alert Behavior During Therapy: WFL for tasks assessed/performed;Restless Overall Cognitive Status: Within Functional Limits for tasks assessed                                 General Comments: Pt able to follow directions; restless, constantly moving. C/o "restless leg syndrome".   General Comments  Pt restless/constantly moving t/o evaluation. Endorsed not sleeping well. Required some redirection to attend to OT evaluation and VCs for adherence to back precautions. Otherwise appeared at or near baseline.    Exercises Other Exercises Other Exercises: Pt and caregiver provided with reinforcement on back precautions.   Other Exercises: Pt and caregiver educated on falls prevention strategies for home and hospital including using adequate footwear when OOB, safe use of AE/DME, and routines modifications.  Other Exercises: Pt and caregiver educated on PLB with activity as ECS. Pt able to return demonstrate.    Shoulder Instructions      Home Living Family/patient expects to be discharged to:: Private residence Living Arrangements: Spouse/significant other Available Help at Discharge: Family;Personal care attendant;Available PRN/intermittently Type of Home: House Home Access: Ramped entrance     Home Layout: One level     Bathroom Shower/Tub: Teacher, early years/pre: Handicapped height     Home Equipment: Environmental consultant - 2 wheels          Prior Functioning/Environment Level of Independence: Needs assistance  Gait / Transfers Assistance Needed: Has been using RW for mobility, hasn't left her home except for appointments in two years. ADL's / Homemaking Assistance Needed: Aid assists with ADL's.  Pt's husband is also very limited in mobility.   Comments: Ambulatory with RW.        OT Problem List: Decreased strength;Decreased activity tolerance;Decreased knowledge of use of DME or AE;Decreased safety awareness;Decreased knowledge of precautions;Impaired balance (sitting and/or standing);Impaired UE functional use;Pain      OT Treatment/Interventions:      OT Goals(Current goals can be found in the care plan section) Acute Rehab OT Goals Patient Stated Goal: to go home OT Goal Formulation: All assessment and education complete, DC therapy Time For Goal Achievement: 04/14/18 Potential to Achieve Goals: Good  OT Frequency:     Barriers to D/C:            Co-evaluation  AM-PAC OT "6 Clicks" Daily Activity     Outcome Measure Help from another person eating meals?: None Help from another person taking care of personal grooming?: None Help from another person  toileting, which includes using toliet, bedpan, or urinal?: A Little Help from another person bathing (including washing, rinsing, drying)?: A Little Help from another person to put on and taking off regular upper body clothing?: None Help from another person to put on and taking off regular lower body clothing?: None 6 Click Score: 22   End of Session Equipment Utilized During Treatment: Gait belt;Rolling walker;Oxygen(2L o2 at start and end of session. [place] in place. ) Nurse Communication: Other (comment)(Pt requesting ativan. )  Activity Tolerance: Patient tolerated treatment well Patient left: in chair;with call bell/phone within reach;with chair alarm set;with family/visitor present  OT Visit Diagnosis: Other abnormalities of gait and mobility (R26.89);History of falling (Z91.81);Pain Pain - Right/Left: (back) Pain - part of body: (back)                Time: 0174-9449 OT Time Calculation (min): 25 min Charges:  OT General Charges $OT Visit: 1 Visit OT Evaluation $OT Eval Low Complexity: 1 Low OT Treatments $Self Care/Home Management : 23-37 mins  Shara Blazing, M.S., OTR/L Ascom: [phone] 04/14/18, 9:37 AM

## 2018-04-14 NOTE — Progress Notes (Signed)
PT Cancellation Note  Patient Details Name: Ana Washington MRN: [id number] DOB: 1945/06/20   Cancelled Treatment:    Reason Eval/Treat Not Completed: Fatigue/lethargy limiting ability to participate   Pt in bed, generally restless and appears SOB.  HR O2 WFL. Stated she just went to bathroom and was fatigued from that.  Declined activity this am hoping for some sleep before discharge. Discussed with RN.   Chesley Noon 04/14/2018, 9:34 AM

## 2018-04-14 NOTE — Progress Notes (Signed)
Pt stayed awake all night C/O not being able to sleep.  She received 2 doses of PO Ativan and a dose of IV Haldol.  Pt walked to nurses station and back, got in chair at one point and was restless.  Unable to satisfy her needs with multiple call bell and phone call requests.

## 2018-04-14 NOTE — TOC Transition Note (Signed)
Transition of Care [facility]) - CM/SW Discharge Note   Patient Details  Name: Ana Washington MRN: [id number] Date of Birth: [birth year]  Transition of Care [facility] CM/SW Contact:  Latanya Maudlin, RN Phone Number: 04/14/2018, 11:58 AM   Clinical Narrative:   Patient to be discharged per MD order. Patient was being worked up for SNF discharge but now prefers to go home with resumption of Hospice care. Patient was active with [facility] hospice prior to admission. Patient prefers to resume. Contacted Liaison Esther Hardy, who has patient scheduled for a home visit. Faxed dc summary to [facility]. No further needs.     Final next level of care: Home w Hospice Care Barriers to Discharge: No Barriers Identified   Patient Goals and CMS Choice Patient states their goals for this hospitalization and ongoing recovery are:: " To be able to go to the kitchen and make a sandwich. To be able to take a shower by myself." CMS Medicare.gov Compare Post Acute Care list provided to:: Patient Choice offered to / list presented to : Patient  Discharge Placement                       Discharge Plan and Services   Post Acute Care Choice: Hospice                [facility] Agency: Other - See comment(hospice via [facility])   Social Determinants of Health (SDOH) Interventions     Readmission Risk Interventions Readmission Risk Prevention Plan 01/06/2018  Transportation Screening Complete  PCP or Specialist Appt within 5-7 Days Complete  Home Care Screening Complete  Medication Review (RN CM) Complete  Some recent data might be hidden

## 2018-04-14 NOTE — Progress Notes (Signed)
Pharmacy Electrolyte Monitoring Consult:  Pharmacy consulted to assist in monitoring and replacing electrolytes in this 73 y.o. female admitted on 04/07/2018 with Abdominal Pain and Nausea   Labs:  Sodium (mmol/L)  Date Value  04/14/2018 131 (L)  03/01/2018 138   Potassium (mmol/L)  Date Value  04/14/2018 3.2 (L)   Magnesium (mg/dL)  Date Value  04/14/2018 1.7   Phosphorus (mg/dL)  Date Value  04/09/2018 3.0   Calcium (mg/dL)  Date Value  04/14/2018 9.2   Albumin (g/dL)  Date Value  04/07/2018 3.5  03/01/2018 3.8    Assessment/Plan: 3/14 K: 3.2, Mg: 1.7  Scr 0.65 Patient has KCL 9mEq PO QD ordered.  Will order KCL 26mEq PO q2h x 2 doses  and Magnesium 2g IV x 1 dose.    Will continue to replace for goal potassium ~ 4 and goal magnesium ~ 2.   Plan to F/U with AM labs and continue to monitor and adjust per consult.   Noralee Space, PharmD, BCPS Clinical Pharmacist 04/14/2018 7:13 AM

## 2018-04-15 ENCOUNTER — Encounter: Payer: Self-pay | Admitting: Orthopedic Surgery

## 2018-04-16 ENCOUNTER — Ambulatory Visit: Payer: Medicare HMO | Admitting: Pain Medicine

## 2018-04-17 ENCOUNTER — Other Ambulatory Visit: Payer: Self-pay | Admitting: Orthopedic Surgery

## 2018-04-17 ENCOUNTER — Other Ambulatory Visit: Payer: Self-pay

## 2018-04-17 ENCOUNTER — Ambulatory Visit
Admission: RE | Admit: 2018-04-17 | Discharge: 2018-04-17 | Disposition: A | Discharge status: Home / Self Care | Payer: Medicare Other | Source: Ambulatory Visit | Attending: Orthopedic Surgery | Admitting: Orthopedic Surgery

## 2018-04-17 DIAGNOSIS — S22050A Wedge compression fracture of T5-T6 vertebra, initial encounter for closed fracture: Secondary | ICD-10-CM | POA: Diagnosis present

## 2018-04-18 MED ORDER — CLINDAMYCIN PHOSPHATE 600 MG/50ML IV SOLN
600.0000 mg | Freq: Once | INTRAVENOUS | Status: AC
  Administered 2018-04-19: 600 mg via INTRAVENOUS

## 2018-04-19 ENCOUNTER — Other Ambulatory Visit: Payer: Self-pay

## 2018-04-19 ENCOUNTER — Ambulatory Visit: Payer: Medicare Other

## 2018-04-19 ENCOUNTER — Ambulatory Visit
Admission: RE | Admit: 2018-04-19 | Discharge: 2018-04-19 | Disposition: A | Discharge status: Home / Self Care | Payer: Medicare Other | Attending: Orthopedic Surgery | Admitting: Orthopedic Surgery

## 2018-04-19 ENCOUNTER — Ambulatory Visit: Admission: RE | Admit: 2018-04-19 | Payer: Medicare HMO | Source: Home / Self Care | Admitting: Orthopedic Surgery

## 2018-04-19 ENCOUNTER — Encounter
Admission: RE | Disposition: A | Discharge status: Home / Self Care | Payer: Self-pay | Source: Home / Self Care | Attending: Orthopedic Surgery

## 2018-04-19 ENCOUNTER — Encounter: Admission: RE | Payer: Self-pay | Source: Home / Self Care

## 2018-04-19 DIAGNOSIS — K219 Gastro-esophageal reflux disease without esophagitis: Secondary | ICD-10-CM | POA: Diagnosis not present

## 2018-04-19 DIAGNOSIS — I10 Essential (primary) hypertension: Secondary | ICD-10-CM | POA: Diagnosis not present

## 2018-04-19 DIAGNOSIS — Z8249 Family history of ischemic heart disease and other diseases of the circulatory system: Secondary | ICD-10-CM | POA: Insufficient documentation

## 2018-04-19 DIAGNOSIS — Z882 Allergy status to sulfonamides status: Secondary | ICD-10-CM | POA: Insufficient documentation

## 2018-04-19 DIAGNOSIS — I4891 Unspecified atrial fibrillation: Secondary | ICD-10-CM | POA: Diagnosis not present

## 2018-04-19 DIAGNOSIS — M4854XA Collapsed vertebra, not elsewhere classified, thoracic region, initial encounter for fracture: Secondary | ICD-10-CM | POA: Diagnosis present

## 2018-04-19 DIAGNOSIS — M1990 Unspecified osteoarthritis, unspecified site: Secondary | ICD-10-CM | POA: Diagnosis not present

## 2018-04-19 DIAGNOSIS — J984 Other disorders of lung: Secondary | ICD-10-CM | POA: Diagnosis not present

## 2018-04-19 DIAGNOSIS — Z419 Encounter for procedure for purposes other than remedying health state, unspecified: Secondary | ICD-10-CM

## 2018-04-19 DIAGNOSIS — I252 Old myocardial infarction: Secondary | ICD-10-CM | POA: Diagnosis not present

## 2018-04-19 DIAGNOSIS — Z880 Allergy status to penicillin: Secondary | ICD-10-CM | POA: Diagnosis not present

## 2018-04-19 DIAGNOSIS — Z79899 Other long term (current) drug therapy: Secondary | ICD-10-CM | POA: Diagnosis not present

## 2018-04-19 DIAGNOSIS — Z7952 Long term (current) use of systemic steroids: Secondary | ICD-10-CM | POA: Insufficient documentation

## 2018-04-19 DIAGNOSIS — Z9981 Dependence on supplemental oxygen: Secondary | ICD-10-CM | POA: Diagnosis not present

## 2018-04-19 DIAGNOSIS — Z885 Allergy status to narcotic agent status: Secondary | ICD-10-CM | POA: Diagnosis not present

## 2018-04-19 DIAGNOSIS — I2510 Atherosclerotic heart disease of native coronary artery without angina pectoris: Secondary | ICD-10-CM | POA: Diagnosis not present

## 2018-04-19 DIAGNOSIS — Z8711 Personal history of peptic ulcer disease: Secondary | ICD-10-CM | POA: Insufficient documentation

## 2018-04-19 DIAGNOSIS — Z7901 Long term (current) use of anticoagulants: Secondary | ICD-10-CM | POA: Insufficient documentation

## 2018-04-19 DIAGNOSIS — L409 Psoriasis, unspecified: Secondary | ICD-10-CM | POA: Insufficient documentation

## 2018-04-19 HISTORY — PX: KYPHOPLASTY: SHX5884

## 2018-04-19 LAB — BASIC METABOLIC PANEL
Anion gap: 7 (ref 5–15)
BUN: 12 mg/dL (ref 8–23)
CALCIUM: 9.1 mg/dL (ref 8.9–10.3)
CO2: 33 mmol/L — ABNORMAL HIGH (ref 22–32)
Chloride: 90 mmol/L — ABNORMAL LOW (ref 98–111)
Creatinine, Ser: 0.45 mg/dL (ref 0.44–1.00)
GFR calc Af Amer: >60 mL/min (ref >60)
GFR calc non Af Amer: >60 mL/min (ref >60)
Glucose, Bld: 89 mg/dL (ref 70–99)
Potassium: 3.5 mmol/L (ref 3.5–5.1)
Sodium: 130 mmol/L — ABNORMAL LOW (ref 135–145)

## 2018-04-19 LAB — CBC
HCT: 35.5 % — ABNORMAL LOW (ref 36.0–46.0)
Hemoglobin: 11.8 g/dL — ABNORMAL LOW (ref 12.0–15.0)
MCH: 33.2 pg (ref 26.0–34.0)
MCHC: 33.2 g/dL (ref 30.0–36.0)
MCV: 100 fL (ref 80.0–100.0)
Platelets: 410 10*3/uL — ABNORMAL HIGH (ref 150–400)
RBC: 3.55 MIL/uL — ABNORMAL LOW (ref 3.87–5.11)
RDW: 11.1 % — ABNORMAL LOW (ref 11.5–15.5)
WBC: 14.5 10*3/uL — ABNORMAL HIGH (ref 4.0–10.5)
nRBC: 0 % (ref 0.0–0.2)

## 2018-04-19 SURGERY — KYPHOPLASTY
Anesthesia: Choice

## 2018-04-19 SURGERY — KYPHOPLASTY
Anesthesia: General

## 2018-04-19 MED ORDER — METOCLOPRAMIDE HCL 10 MG PO TABS: 5.0000 mg | ORAL_TABLET | Freq: Three times a day (TID) | ORAL | Status: DC | PRN

## 2018-04-19 MED ORDER — HYDROCODONE-ACETAMINOPHEN 5-325 MG PO TABS: 1.0000 | ORAL_TABLET | ORAL | Status: DC | PRN

## 2018-04-19 MED ORDER — ONDANSETRON HCL 4 MG/2ML IJ SOLN: 4.0000 mg | Freq: Four times a day (QID) | INTRAMUSCULAR | Status: DC | PRN

## 2018-04-19 MED ORDER — IOPAMIDOL (ISOVUE-M 200) INJECTION 41%
INTRAMUSCULAR | Status: DC | PRN
  Administered 2018-04-19: 20 mL

## 2018-04-19 MED ORDER — LIDOCAINE HCL (CARDIAC) PF 100 MG/5ML IV SOSY
PREFILLED_SYRINGE | INTRAVENOUS | Status: DC | PRN
  Administered 2018-04-19: 60 mg via INTRAVENOUS

## 2018-04-19 MED ORDER — FENTANYL CITRATE (PF) 100 MCG/2ML IJ SOLN
INTRAMUSCULAR | Status: DC | PRN
  Administered 2018-04-19 (×2): 25 ug via INTRAVENOUS
  Administered 2018-04-19: 50 ug via INTRAVENOUS

## 2018-04-19 MED ORDER — LIDOCAINE HCL (PF) 1 % IJ SOLN
INTRAMUSCULAR | Status: AC
  Filled 2018-04-19: qty 30

## 2018-04-19 MED ORDER — CLINDAMYCIN PHOSPHATE 600 MG/50ML IV SOLN
INTRAVENOUS | Status: AC
  Filled 2018-04-19: qty 50

## 2018-04-19 MED ORDER — IPRATROPIUM-ALBUTEROL 0.5-2.5 (3) MG/3ML IN SOLN
RESPIRATORY_TRACT | Status: AC
  Administered 2018-04-19: 3 mL
  Filled 2018-04-19: qty 3

## 2018-04-19 MED ORDER — IPRATROPIUM-ALBUTEROL 0.5-2.5 (3) MG/3ML IN SOLN: 3.0000 mL | Freq: Four times a day (QID) | RESPIRATORY_TRACT | Status: DC

## 2018-04-19 MED ORDER — PROPOFOL 500 MG/50ML IV EMUL
INTRAVENOUS | Status: DC | PRN
  Administered 2018-04-19: 75 ug/kg/min via INTRAVENOUS

## 2018-04-19 MED ORDER — LACTATED RINGERS IV SOLN
INTRAVENOUS | Status: DC
  Administered 2018-04-19: 10:00:00 via INTRAVENOUS

## 2018-04-19 MED ORDER — ONDANSETRON HCL 4 MG PO TABS: 4.0000 mg | ORAL_TABLET | Freq: Four times a day (QID) | ORAL | Status: DC | PRN

## 2018-04-19 MED ORDER — BUPIVACAINE HCL (PF) 0.5 % IJ SOLN
INTRAMUSCULAR | Status: AC
  Filled 2018-04-19: qty 30

## 2018-04-19 MED ORDER — SODIUM CHLORIDE 0.9 % IV SOLN: INTRAVENOUS | Status: DC

## 2018-04-19 MED ORDER — METOCLOPRAMIDE HCL 5 MG/ML IJ SOLN: 5.0000 mg | Freq: Three times a day (TID) | INTRAMUSCULAR | Status: DC | PRN

## 2018-04-19 SURGICAL SUPPLY — 20 items
CEMENT KYPHON CX01A KIT/MIXER (Cement) ×2 IMPLANT
COVER WAND RF STERILE (DRAPES) ×2 IMPLANT
DERMABOND ADVANCED (GAUZE/BANDAGES/DRESSINGS) ×1
DERMABOND ADVANCED .7 DNX12 (GAUZE/BANDAGES/DRESSINGS) ×1 IMPLANT
DEVICE BIOPSY BONE KYPHX (INSTRUMENTS) ×2 IMPLANT
DRAPE C-ARM XRAY 36X54 (DRAPES) ×2 IMPLANT
DURAPREP 26ML APPLICATOR (WOUND CARE) ×2 IMPLANT
FEE RENTAL RFA GENERATOR (MISCELLANEOUS) IMPLANT
GLOVE SURG SYN 9.0  PF PI (GLOVE) ×1
GLOVE SURG SYN 9.0 PF PI (GLOVE) ×1 IMPLANT
GOWN SRG 2XL LVL 4 RGLN SLV (GOWNS) ×1 IMPLANT
GOWN STRL NON-REIN 2XL LVL4 (GOWNS) ×1
GOWN STRL REUS W/ TWL LRG LVL3 (GOWN DISPOSABLE) ×1 IMPLANT
GOWN STRL REUS W/TWL LRG LVL3 (GOWN DISPOSABLE) ×1
PACK KYPHOPLASTY (MISCELLANEOUS) ×2 IMPLANT
RENTAL RFA GENERATOR (MISCELLANEOUS) IMPLANT
STRAP SAFETY 5IN WIDE (MISCELLANEOUS) ×2 IMPLANT
TRAY KYPHOPAK 15/2 EXPRESS (KITS) ×1 IMPLANT
TRAY KYPHOPAK 15/3 EXPRESS 1ST (MISCELLANEOUS) ×2 IMPLANT
TRAY KYPHOPAK 20/3 EXPRESS 1ST (MISCELLANEOUS) ×1 IMPLANT

## 2018-04-19 NOTE — Discharge Instructions (Addendum)
Take it easy today and tomorrow and resume more normal activities on Saturday.  Remove Band-Aids on Saturday then okay to shower.  Call office if you are having problems    AMBULATORY SURGERY  DISCHARGE INSTRUCTIONS   1) The drugs that you were given will stay in your system until tomorrow so for the next 24 hours you should not:  A) Drive an automobile B) Make any legal decisions C) Drink any alcoholic beverage   2) You may resume regular meals tomorrow.  Today it is better to start with liquids and gradually work up to solid foods.  You may eat anything you prefer, but it is better to start with liquids, then soup and crackers, and gradually work up to solid foods.   3) Please notify your doctor immediately if you have any unusual bleeding, trouble breathing, redness and pain at the surgery site, drainage, fever, or pain not relieved by medication.    4) Additional Instructions:        Please contact your physician with any problems or Same Day Surgery at [phone], Monday through Friday 6 am to 4 pm, or [place] at [facility] number at [phone].

## 2018-04-19 NOTE — Anesthesia Postprocedure Evaluation (Signed)
Anesthesia Post Note  Patient: KYTZIA GIENGER  Procedure(s) Performed: KYPHOPLASTY T5, T6 (N/A )  Patient location during evaluation: PACU Anesthesia Type: General Level of consciousness: awake and alert and oriented Pain management: pain level controlled Vital Signs Assessment: post-procedure vital signs reviewed and stable Respiratory status: spontaneous breathing, nonlabored ventilation and respiratory function stable Cardiovascular status: blood pressure returned to baseline and stable Postop Assessment: no signs of nausea or vomiting Anesthetic complications: no     Last Vitals:  Vitals:   04/19/18 1112 04/19/18 1125  BP: (!) 145/88   Pulse:  94  Resp: 20 (!) 22  Temp:  36.8 C  SpO2:      Last Pain:  Vitals:   04/19/18 1125  TempSrc:   PainSc: 0-No pain      LLE Sensation: Full sensation (04/19/18 1125) RLE Motor Response: Purposeful movement (04/19/18 1125) RLE Sensation: Full sensation (04/19/18 1125)      Reah Justo

## 2018-04-19 NOTE — Transfer of Care (Signed)
Immediate Anesthesia Transfer of Care Note  Patient: Ana Washington  Procedure(s) Performed: KYPHOPLASTY T5, T6 (N/A )  Patient Location: PACU  Anesthesia Type:General  Level of Consciousness: sedated  Airway & Oxygen Therapy: Patient Spontanous Breathing and Patient connected to nasal cannula oxygen  Post-op Assessment: Report given to RN and Post -op Vital signs reviewed and stable  Post vital signs: Reviewed  Last Vitals:  Vitals Value Taken Time  BP 95/60 04/19/2018 10:45 AM  Temp 36.8 C 04/19/2018 10:45 AM  Pulse 86 04/19/2018 10:45 AM  Resp 14 04/19/2018 10:45 AM  SpO2 99 % 04/19/2018 10:45 AM    Last Pain:  Vitals:   04/19/18 0928  TempSrc: Oral  PainSc: 4       Patients Stated Pain Goal: 0 (37/62/83 1517)  Complications: No apparent anesthesia complications

## 2018-04-19 NOTE — Op Note (Signed)
Date April 19, 2018  time 10:43 AM   PATIENT:  Ana Washington   PRE-OPERATIVE DIAGNOSIS:  closed wedge compression fracture of T5 and T6   POST-OPERATIVE DIAGNOSIS:  closed wedge compression fracture of T5 and T6   PROCEDURE:  Procedure(s): KYPHOPLASTY T5 and T6  SURGEON: Laurene Footman, MD   ASSISTANTS: None   ANESTHESIA:   local and MAC   EBL:  No intake/output data recorded.   BLOOD ADMINISTERED:none   DRAINS: none    LOCAL MEDICATIONS USED:  MARCAINE    and XYLOCAINE    SPECIMEN:   None   DISPOSITION OF SPECIMEN:  Not applicable   COUNTS:  YES   TOURNIQUET:  * No tourniquets in log *   IMPLANTS: Bone cement   DICTATION: .Dragon Dictation  patient was brought to the operating room and after adequate anesthesia was obtained the patient was placed prone.  C arm was brought in in good visualization of the affected level obtained on both AP and lateral projections.  After patient identification and timeout procedures were completed, local anesthetic was infiltrated with 10 cc 1% Xylocaine infiltrated subcutaneously.  This is done the area on the right side of the planned approachs.  The back was then prepped and draped in the usual sterile manner and repeat timeout procedure carried out.  A spinal needle was brought down to the pedicle on the right side of  T5 and T6 and a 50-50 mix of 1% Xylocaine half percent Sensorcaine with epinephrine total of 20 cc injected.  After allowing this to set a small incision was made and the trocar was advanced into the vertebral body in an extrapedicular fashion.  Biopsy was not obtained at either level Drilling was carried out balloon inserted with inflation to  to cc.  When the cement was appropriate consistency 3 cc were injected into the vertebral body at  both levels without extravasation, good fill superior to inferior endplates and from right to left sides along the inferior endplate.  After the cement had set the trochar was removed and  permanent C-arm views obtained.  The wound was closed with Dermabond followed by Band-Aid   PLAN OF CARE: Discharge to home after PACU   PATIENT DISPOSITION:  PACU - hemodynamically stable.

## 2018-04-19 NOTE — H&P (Signed)
Reviewed paper H+P, will be scanned into chart. No changes noted.  

## 2018-04-19 NOTE — Progress Notes (Signed)
PT with increased work of breathing, Dr Randa Lynn in , pt spitting up yellow secretions, pt on 2 liters at home and in hospital, Duo neb adminiastered

## 2018-04-19 NOTE — Anesthesia Preprocedure Evaluation (Signed)
Anesthesia Evaluation  Patient identified by MRN, date of birth, ID band Patient awake    Reviewed: Allergy & Precautions, NPO status , Patient's Chart, lab work & pertinent test results  History of Anesthesia Complications (+) [name] and history of anesthetic complications  Airway Mallampati: II  TM Distance: >3 FB Neck ROM: Full    Dental  (+) Upper Dentures   Pulmonary shortness of breath and Long-Term Oxygen Therapy, neg sleep apnea, neg COPD,  Bronchiectasis    breath sounds clear to auscultation- rhonchi (-) wheezing      Cardiovascular hypertension, + CAD and + Past MI  (-) Cardiac Stents and (-) CABG + dysrhythmias Atrial Fibrillation  Rhythm:Regular Rate:Normal - Systolic murmurs and - Diastolic murmurs    Neuro/Psych  Headaches, neg Seizures negative psych ROS   GI/Hepatic Neg liver ROS, PUD, GERD  Medicated,  Endo/Other  negative endocrine ROSneg diabetes  Renal/GU negative Renal ROS     Musculoskeletal  (+) Arthritis ,   Abdominal (+) - obese,   Peds  Hematology negative hematology ROS (+)   Anesthesia Other Findings Past Medical History: No date: Arthritis No date: Atrial fibrillation (HCC) No date: Bronchiectasis (HCC) No date: CAD (coronary artery disease) 07/30/2017: CAD (coronary artery disease) No date: Dumping syndrome No date: Dyspnea No date: Dysrhythmia     Comment:  afib 10/03/2013: Essential hypertension, malignant No date: Family history of adverse reaction to anesthesia     Comment:  sister [name] No date: GERD (gastroesophageal reflux disease) No date: Headache     Comment:  MIGRAINES No date: Hypertension No date: Lung disease 2007: Myocardial infarction ([place])     Comment:  Non-STEMI No date: [name] (postoperative nausea and vomiting) No date: Psoriasis No date: PUD (peptic ulcer disease)   Reproductive/Obstetrics                             Anesthesia  Physical Anesthesia Plan  ASA: IV  Anesthesia Plan: General   Post-op Pain Management:    Induction: Intravenous  [name] Risk Score and Plan: 3 and Propofol infusion  Airway Management Planned: Natural Airway  Additional Equipment:   Intra-op Plan:   Post-operative Plan:   Informed Consent: I have reviewed the patients History and Physical, chart, labs and discussed the procedure including the risks, benefits and alternatives for the proposed anesthesia with the patient or authorized representative who has indicated his/her understanding and acceptance.     Dental advisory given  Plan Discussed with: CRNA and Anesthesiologist  Anesthesia Plan Comments:         Anesthesia Quick Evaluation

## 2018-04-19 NOTE — Anesthesia Post-op Follow-up Note (Signed)
Anesthesia QCDR form completed.        

## 2018-08-13 ENCOUNTER — Other Ambulatory Visit: Payer: Self-pay | Admitting: Pain Medicine

## 2018-09-27 ENCOUNTER — Emergency Department: Payer: Medicare Other

## 2018-09-27 ENCOUNTER — Inpatient Hospital Stay
Admission: EM | Admit: 2018-09-27 | Discharge: 2018-10-11 | DRG: 480 | Disposition: A | Discharge status: Skilled Nursing Facility | Payer: Medicare Other | Attending: Internal Medicine | Admitting: Internal Medicine

## 2018-09-27 ENCOUNTER — Other Ambulatory Visit: Payer: Self-pay

## 2018-09-27 ENCOUNTER — Encounter: Payer: Self-pay | Admitting: Emergency Medicine

## 2018-09-27 DIAGNOSIS — W010XXA Fall on same level from slipping, tripping and stumbling without subsequent striking against object, initial encounter: Secondary | ICD-10-CM | POA: Diagnosis present

## 2018-09-27 DIAGNOSIS — N390 Urinary tract infection, site not specified: Secondary | ICD-10-CM | POA: Diagnosis present

## 2018-09-27 DIAGNOSIS — D62 Acute posthemorrhagic anemia: Secondary | ICD-10-CM | POA: Diagnosis not present

## 2018-09-27 DIAGNOSIS — K219 Gastro-esophageal reflux disease without esophagitis: Secondary | ICD-10-CM | POA: Diagnosis present

## 2018-09-27 DIAGNOSIS — I480 Paroxysmal atrial fibrillation: Secondary | ICD-10-CM | POA: Diagnosis present

## 2018-09-27 DIAGNOSIS — I10 Essential (primary) hypertension: Secondary | ICD-10-CM | POA: Diagnosis present

## 2018-09-27 DIAGNOSIS — W19XXXA Unspecified fall, initial encounter: Secondary | ICD-10-CM

## 2018-09-27 DIAGNOSIS — I252 Old myocardial infarction: Secondary | ICD-10-CM

## 2018-09-27 DIAGNOSIS — Z23 Encounter for immunization: Secondary | ICD-10-CM

## 2018-09-27 DIAGNOSIS — M25561 Pain in right knee: Secondary | ICD-10-CM | POA: Diagnosis not present

## 2018-09-27 DIAGNOSIS — E876 Hypokalemia: Secondary | ICD-10-CM | POA: Diagnosis present

## 2018-09-27 DIAGNOSIS — R059 Cough, unspecified: Secondary | ICD-10-CM

## 2018-09-27 DIAGNOSIS — Z20828 Contact with and (suspected) exposure to other viral communicable diseases: Secondary | ICD-10-CM | POA: Diagnosis present

## 2018-09-27 DIAGNOSIS — J9621 Acute and chronic respiratory failure with hypoxia: Secondary | ICD-10-CM | POA: Diagnosis present

## 2018-09-27 DIAGNOSIS — M810 Age-related osteoporosis without current pathological fracture: Secondary | ICD-10-CM | POA: Diagnosis present

## 2018-09-27 DIAGNOSIS — L409 Psoriasis, unspecified: Secondary | ICD-10-CM | POA: Diagnosis present

## 2018-09-27 DIAGNOSIS — Z8619 Personal history of other infectious and parasitic diseases: Secondary | ICD-10-CM | POA: Diagnosis not present

## 2018-09-27 DIAGNOSIS — Z792 Long term (current) use of antibiotics: Secondary | ICD-10-CM

## 2018-09-27 DIAGNOSIS — S72144A Nondisplaced intertrochanteric fracture of right femur, initial encounter for closed fracture: Secondary | ICD-10-CM | POA: Diagnosis not present

## 2018-09-27 DIAGNOSIS — I2510 Atherosclerotic heart disease of native coronary artery without angina pectoris: Secondary | ICD-10-CM | POA: Diagnosis present

## 2018-09-27 DIAGNOSIS — J8410 Pulmonary fibrosis, unspecified: Secondary | ICD-10-CM | POA: Diagnosis present

## 2018-09-27 DIAGNOSIS — S72001A Fracture of unspecified part of neck of right femur, initial encounter for closed fracture: Secondary | ICD-10-CM

## 2018-09-27 DIAGNOSIS — S72141A Displaced intertrochanteric fracture of right femur, initial encounter for closed fracture: Principal | ICD-10-CM | POA: Diagnosis present

## 2018-09-27 DIAGNOSIS — R05 Cough: Secondary | ICD-10-CM

## 2018-09-27 DIAGNOSIS — Z885 Allergy status to narcotic agent status: Secondary | ICD-10-CM

## 2018-09-27 DIAGNOSIS — Z09 Encounter for follow-up examination after completed treatment for conditions other than malignant neoplasm: Secondary | ICD-10-CM

## 2018-09-27 DIAGNOSIS — J471 Bronchiectasis with (acute) exacerbation: Secondary | ICD-10-CM | POA: Diagnosis present

## 2018-09-27 DIAGNOSIS — Z902 Acquired absence of lung [part of]: Secondary | ICD-10-CM | POA: Diagnosis not present

## 2018-09-27 DIAGNOSIS — Y9301 Activity, walking, marching and hiking: Secondary | ICD-10-CM | POA: Diagnosis present

## 2018-09-27 DIAGNOSIS — Z8711 Personal history of peptic ulcer disease: Secondary | ICD-10-CM

## 2018-09-27 DIAGNOSIS — Y92019 Unspecified place in single-family (private) house as the place of occurrence of the external cause: Secondary | ICD-10-CM

## 2018-09-27 DIAGNOSIS — Z1612 Extended spectrum beta lactamase (ESBL) resistance: Secondary | ICD-10-CM | POA: Diagnosis present

## 2018-09-27 DIAGNOSIS — Z66 Do not resuscitate: Secondary | ICD-10-CM | POA: Diagnosis present

## 2018-09-27 DIAGNOSIS — R509 Fever, unspecified: Secondary | ICD-10-CM

## 2018-09-27 DIAGNOSIS — Z79899 Other long term (current) drug therapy: Secondary | ICD-10-CM

## 2018-09-27 DIAGNOSIS — Z7982 Long term (current) use of aspirin: Secondary | ICD-10-CM

## 2018-09-27 DIAGNOSIS — Z7901 Long term (current) use of anticoagulants: Secondary | ICD-10-CM

## 2018-09-27 DIAGNOSIS — Z8249 Family history of ischemic heart disease and other diseases of the circulatory system: Secondary | ICD-10-CM

## 2018-09-27 DIAGNOSIS — Z9981 Dependence on supplemental oxygen: Secondary | ICD-10-CM | POA: Diagnosis not present

## 2018-09-27 DIAGNOSIS — R911 Solitary pulmonary nodule: Secondary | ICD-10-CM | POA: Diagnosis present

## 2018-09-27 DIAGNOSIS — R000 Tachycardia, unspecified: Secondary | ICD-10-CM | POA: Diagnosis not present

## 2018-09-27 DIAGNOSIS — Z880 Allergy status to penicillin: Secondary | ICD-10-CM

## 2018-09-27 DIAGNOSIS — Z882 Allergy status to sulfonamides status: Secondary | ICD-10-CM

## 2018-09-27 LAB — COMPREHENSIVE METABOLIC PANEL
ALT: 20 U/L (ref 0–44)
AST: 30 U/L (ref 15–41)
Albumin: 3.5 g/dL (ref 3.5–5.0)
Alkaline Phosphatase: 49 U/L (ref 38–126)
Anion gap: 13 (ref 5–15)
BUN: 14 mg/dL (ref 8–23)
CO2: 35 mmol/L — ABNORMAL HIGH (ref 22–32)
Calcium: 9.3 mg/dL (ref 8.9–10.3)
Chloride: 93 mmol/L — ABNORMAL LOW (ref 98–111)
Creatinine, Ser: 0.58 mg/dL (ref 0.44–1.00)
GFR calc Af Amer: >60 mL/min (ref >60)
GFR calc non Af Amer: >60 mL/min (ref >60)
Glucose, Bld: 118 mg/dL — ABNORMAL HIGH (ref 70–99)
Potassium: 3 mmol/L — ABNORMAL LOW (ref 3.5–5.1)
Sodium: 141 mmol/L (ref 135–145)
Total Bilirubin: 0.7 mg/dL (ref 0.3–1.2)
Total Protein: 6.6 g/dL (ref 6.5–8.1)

## 2018-09-27 LAB — CBC WITH DIFFERENTIAL/PLATELET
Abs Immature Granulocytes: 0.28 10*3/uL — ABNORMAL HIGH (ref 0.00–0.07)
Basophils Absolute: 0.1 10*3/uL (ref 0.0–0.1)
Basophils Relative: 1 %
Eosinophils Absolute: 0.4 10*3/uL (ref 0.0–0.5)
Eosinophils Relative: 2 %
HCT: 38.3 % (ref 36.0–46.0)
Hemoglobin: 12.9 g/dL (ref 12.0–15.0)
Immature Granulocytes: 2 %
Lymphocytes Relative: 14 %
Lymphs Abs: 2.6 10*3/uL (ref 0.7–4.0)
MCH: 34.1 pg — ABNORMAL HIGH (ref 26.0–34.0)
MCHC: 33.7 g/dL (ref 30.0–36.0)
MCV: 101.3 fL — ABNORMAL HIGH (ref 80.0–100.0)
Monocytes Absolute: 0.8 10*3/uL (ref 0.1–1.0)
Monocytes Relative: 4 %
Neutro Abs: 14.6 10*3/uL — ABNORMAL HIGH (ref 1.7–7.7)
Neutrophils Relative %: 77 %
Platelets: 318 10*3/uL (ref 150–400)
RBC: 3.78 MIL/uL — ABNORMAL LOW (ref 3.87–5.11)
RDW: 12 % (ref 11.5–15.5)
WBC: 18.7 10*3/uL — ABNORMAL HIGH (ref 4.0–10.5)
nRBC: 0 % (ref 0.0–0.2)

## 2018-09-27 LAB — URINALYSIS, COMPLETE (UACMP) WITH MICROSCOPIC
Bilirubin Urine: NEGATIVE
Glucose, UA: NEGATIVE mg/dL
Hgb urine dipstick: NEGATIVE
Ketones, ur: NEGATIVE mg/dL
Leukocytes,Ua: NEGATIVE
Nitrite: NEGATIVE
Protein, ur: NEGATIVE mg/dL
Specific Gravity, Urine: 1.013 (ref 1.005–1.030)
Squamous Epithelial / HPF: NONE SEEN (ref 0–5)
WBC, UA: NONE SEEN WBC/hpf (ref 0–5)
pH: 7 (ref 5.0–8.0)

## 2018-09-27 LAB — TROPONIN I (HIGH SENSITIVITY)
Troponin I (High Sensitivity): 26 ng/L — ABNORMAL HIGH (ref <18)
Troponin I (High Sensitivity): 42 ng/L — ABNORMAL HIGH (ref <18)

## 2018-09-27 LAB — BLOOD GAS, VENOUS
Patient temperature: 37
pCO2, Ven: 65 mmHg — ABNORMAL HIGH (ref 44.0–60.0)
pH, Ven: 7.43 (ref 7.250–7.430)
pO2, Ven: 32 mmHg (ref 32.0–45.0)

## 2018-09-27 LAB — MAGNESIUM: Magnesium: 1.6 mg/dL — ABNORMAL LOW (ref 1.7–2.4)

## 2018-09-27 LAB — BRAIN NATRIURETIC PEPTIDE: B Natriuretic Peptide: 108 pg/mL — ABNORMAL HIGH (ref 0.0–100.0)

## 2018-09-27 LAB — SARS CORONAVIRUS 2 BY RT PCR (HOSPITAL ORDER, PERFORMED IN ~~LOC~~ HOSPITAL LAB): SARS Coronavirus 2: NEGATIVE

## 2018-09-27 LAB — MRSA PCR SCREENING: MRSA by PCR: NEGATIVE

## 2018-09-27 MED ORDER — IPRATROPIUM-ALBUTEROL 0.5-2.5 (3) MG/3ML IN SOLN
3.0000 mL | Freq: Four times a day (QID) | RESPIRATORY_TRACT | Status: DC
  Administered 2018-09-27 – 2018-09-28 (×5): 3 mL via RESPIRATORY_TRACT
  Filled 2018-09-27 (×5): qty 3

## 2018-09-27 MED ORDER — ALBUTEROL SULFATE (2.5 MG/3ML) 0.083% IN NEBU: 2.5000 mg | INHALATION_SOLUTION | RESPIRATORY_TRACT | Status: DC | PRN

## 2018-09-27 MED ORDER — ACETAMINOPHEN 325 MG PO TABS
650.0000 mg | ORAL_TABLET | Freq: Four times a day (QID) | ORAL | Status: DC | PRN
  Administered 2018-09-27 – 2018-09-29 (×5): 650 mg via ORAL
  Filled 2018-09-27 (×5): qty 2

## 2018-09-27 MED ORDER — SODIUM CHLORIDE 0.9 % IV SOLN
1.0000 g | INTRAVENOUS | Status: DC
  Administered 2018-09-28: 15:00:00 1 g via INTRAVENOUS
  Filled 2018-09-27: qty 10
  Filled 2018-09-27: qty 1

## 2018-09-27 MED ORDER — FENTANYL CITRATE (PF) 100 MCG/2ML IJ SOLN
25.0000 ug | Freq: Once | INTRAMUSCULAR | Status: AC
  Administered 2018-09-27: 25 ug via INTRAVENOUS
  Filled 2018-09-27: qty 2

## 2018-09-27 MED ORDER — ALBUTEROL SULFATE (2.5 MG/3ML) 0.083% IN NEBU
2.5000 mg | INHALATION_SOLUTION | Freq: Once | RESPIRATORY_TRACT | Status: AC
  Administered 2018-09-27: 14:00:00 2.5 mg via RESPIRATORY_TRACT
  Filled 2018-09-27: qty 3

## 2018-09-27 MED ORDER — LORAZEPAM 0.5 MG PO TABS
0.5000 mg | ORAL_TABLET | ORAL | Status: DC
  Administered 2018-09-27 – 2018-10-11 (×58): 0.5 mg via ORAL
  Filled 2018-09-27 (×62): qty 1

## 2018-09-27 MED ORDER — FERROUS SULFATE 325 (65 FE) MG PO TABS
325.0000 mg | ORAL_TABLET | Freq: Every day | ORAL | Status: DC
  Administered 2018-09-28 – 2018-10-09 (×11): 325 mg via ORAL
  Filled 2018-09-27 (×13): qty 1

## 2018-09-27 MED ORDER — PANTOPRAZOLE SODIUM 40 MG PO TBEC
40.0000 mg | DELAYED_RELEASE_TABLET | Freq: Every day | ORAL | Status: DC
  Administered 2018-09-27 – 2018-10-11 (×15): 40 mg via ORAL
  Filled 2018-09-27 (×16): qty 1

## 2018-09-27 MED ORDER — SENNOSIDES-DOCUSATE SODIUM 8.6-50 MG PO TABS
1.0000 | ORAL_TABLET | Freq: Every evening | ORAL | Status: DC | PRN
  Filled 2018-09-27: qty 1

## 2018-09-27 MED ORDER — VITAMIN C 500 MG PO TABS
1000.0000 mg | ORAL_TABLET | Freq: Every day | ORAL | Status: DC
  Administered 2018-09-28 – 2018-10-11 (×13): 1000 mg via ORAL
  Filled 2018-09-27 (×14): qty 2

## 2018-09-27 MED ORDER — SUCRALFATE 1 G PO TABS
1.0000 g | ORAL_TABLET | Freq: Four times a day (QID) | ORAL | Status: DC
  Administered 2018-09-27 – 2018-10-09 (×14): 1 g via ORAL
  Filled 2018-09-27 (×26): qty 1

## 2018-09-27 MED ORDER — ACETAMINOPHEN 650 MG RE SUPP: 650.0000 mg | Freq: Four times a day (QID) | RECTAL | Status: DC | PRN

## 2018-09-27 MED ORDER — SODIUM CHLORIDE 0.9 % IV SOLN
1.0000 g | Freq: Once | INTRAVENOUS | Status: AC
  Administered 2018-09-27: 14:00:00 1 g via INTRAVENOUS
  Filled 2018-09-27: qty 10

## 2018-09-27 MED ORDER — POTASSIUM CHLORIDE CRYS ER 20 MEQ PO TBCR
40.0000 meq | EXTENDED_RELEASE_TABLET | Freq: Two times a day (BID) | ORAL | Status: AC
  Administered 2018-09-27 – 2018-09-28 (×2): 40 meq via ORAL
  Filled 2018-09-27 (×2): qty 2

## 2018-09-27 MED ORDER — METOPROLOL SUCCINATE ER 50 MG PO TB24
50.0000 mg | ORAL_TABLET | Freq: Every day | ORAL | Status: DC
  Administered 2018-09-28 – 2018-10-10 (×13): 50 mg via ORAL
  Filled 2018-09-27 (×13): qty 1

## 2018-09-27 MED ORDER — ALBUTEROL SULFATE (2.5 MG/3ML) 0.083% IN NEBU
2.5000 mg | INHALATION_SOLUTION | Freq: Once | RESPIRATORY_TRACT | Status: AC
  Administered 2018-09-27: 2.5 mg via RESPIRATORY_TRACT
  Filled 2018-09-27: qty 3

## 2018-09-27 MED ORDER — GUAIFENESIN-DM 100-10 MG/5ML PO SYRP
5.0000 mL | ORAL_SOLUTION | ORAL | Status: DC | PRN
  Administered 2018-10-11: 09:00:00 5 mL via ORAL
  Filled 2018-09-27: qty 5

## 2018-09-27 MED ORDER — MUPIROCIN 2 % EX OINT
1.0000 "application " | TOPICAL_OINTMENT | Freq: Two times a day (BID) | CUTANEOUS | Status: DC
  Filled 2018-09-27: qty 22

## 2018-09-27 MED ORDER — HYDROCOD POLST-CPM POLST ER 10-8 MG/5ML PO SUER
5.0000 mL | Freq: Every day | ORAL | Status: DC
  Administered 2018-09-27 – 2018-10-10 (×14): 5 mL via ORAL
  Filled 2018-09-27 (×16): qty 5

## 2018-09-27 MED ORDER — ONDANSETRON HCL 4 MG PO TABS
4.0000 mg | ORAL_TABLET | Freq: Four times a day (QID) | ORAL | Status: DC | PRN
  Administered 2018-09-29: 15:00:00 4 mg via ORAL
  Filled 2018-09-27: qty 1

## 2018-09-27 MED ORDER — TRAMADOL HCL 50 MG PO TABS
50.0000 mg | ORAL_TABLET | Freq: Four times a day (QID) | ORAL | Status: DC | PRN
  Administered 2018-09-27 – 2018-09-29 (×6): 50 mg via ORAL
  Filled 2018-09-27 (×6): qty 1

## 2018-09-27 MED ORDER — MORPHINE SULFATE (PF) 4 MG/ML IV SOLN
4.0000 mg | INTRAVENOUS | Status: DC | PRN
  Filled 2018-09-27: qty 1

## 2018-09-27 MED ORDER — SODIUM CHLORIDE 0.9 % IV SOLN
500.0000 mg | Freq: Once | INTRAVENOUS | Status: AC
  Administered 2018-09-27: 500 mg via INTRAVENOUS
  Filled 2018-09-27: qty 500

## 2018-09-27 MED ORDER — DILTIAZEM HCL 60 MG PO TABS
60.0000 mg | ORAL_TABLET | Freq: Two times a day (BID) | ORAL | Status: DC
  Administered 2018-09-27 – 2018-09-28 (×2): 60 mg via ORAL
  Filled 2018-09-27 (×3): qty 1
  Filled 2018-09-27 (×2): qty 2

## 2018-09-27 MED ORDER — GABAPENTIN 300 MG PO CAPS
300.0000 mg | ORAL_CAPSULE | Freq: Four times a day (QID) | ORAL | Status: DC
  Administered 2018-09-27 – 2018-10-11 (×51): 300 mg via ORAL
  Filled 2018-09-27 (×52): qty 1

## 2018-09-27 MED ORDER — CALCIUM CARBONATE-VITAMIN D 500-200 MG-UNIT PO TABS
1.0000 | ORAL_TABLET | Freq: Every day | ORAL | Status: DC
  Administered 2018-09-29 – 2018-10-11 (×7): 1 via ORAL
  Filled 2018-09-27 (×11): qty 1

## 2018-09-27 MED ORDER — BISACODYL 5 MG PO TBEC
5.0000 mg | DELAYED_RELEASE_TABLET | Freq: Every day | ORAL | Status: DC | PRN
  Administered 2018-09-29: 10:00:00 5 mg via ORAL
  Filled 2018-09-27: qty 1

## 2018-09-27 MED ORDER — ONDANSETRON HCL 4 MG/2ML IJ SOLN: 4.0000 mg | Freq: Four times a day (QID) | INTRAMUSCULAR | Status: DC | PRN

## 2018-09-27 MED ORDER — SODIUM CHLORIDE 0.9 % IV SOLN
500.0000 mg | INTRAVENOUS | Status: DC
  Filled 2018-09-27: qty 500

## 2018-09-27 MED ORDER — IPRATROPIUM-ALBUTEROL 0.5-2.5 (3) MG/3ML IN SOLN
3.0000 mL | Freq: Once | RESPIRATORY_TRACT | Status: AC
  Administered 2018-09-27: 3 mL via RESPIRATORY_TRACT
  Filled 2018-09-27: qty 3

## 2018-09-27 MED ORDER — RISAQUAD PO CAPS
1.0000 | ORAL_CAPSULE | Freq: Every day | ORAL | Status: DC
  Administered 2018-09-27 – 2018-10-11 (×13): 1 via ORAL
  Filled 2018-09-27 (×15): qty 1

## 2018-09-27 NOTE — Consult Note (Signed)
Pharmacy Antibiotic Note  Ana Washington is a 73 y.o. female admitted on 09/27/2018 with pneumonia.  Pharmacy has been consulted for Rocephin dosing. Patient is also receiving Azithromycin currently.  Plan: Rocephin 1g Q24 hours  Height: 5\' 5"  (165.1 cm) Weight: 105 lb (47.6 kg) IBW/kg (Calculated) : 57  Temp (24hrs), Avg:97.9 F (36.6 C), Min:97.7 F (36.5 C), Max:98 F (36.7 C)  Recent Labs  Lab 09/27/18 1309  WBC 18.7*  CREATININE 0.58    Estimated Creatinine Clearance: 46.4 mL/min (by C-G formula based on SCr of 0.58 mg/dL).    Allergies  Allergen Reactions  . Sulfa Antibiotics Diarrhea  . Codeine Nausea And Vomiting  . Penicillins Rash    Has patient had a PCN reaction causing immediate rash, facial/tongue/throat swelling, SOB or lightheadedness with hypotension: Unknown Has patient had a PCN reaction causing severe rash involving mucus membranes or skin necrosis: Unknown Has patient had a PCN reaction that required hospitalization: Unknown Has patient had a PCN reaction occurring within the last 10 years: No If all of the above answers are "NO", then may proceed with Cephalosporin use.     Antimicrobials this admission: Rocephin 8/27 >>  Azithromycin 8/27 >>     Thank you for allowing pharmacy to be a part of this patient's care.  Pearla Dubonnet 09/27/2018 6:53 PM

## 2018-09-27 NOTE — Consult Note (Signed)
ORTHOPAEDIC CONSULTATION  PATIENT NAME: Ana Washington DOB: 12-16-1945  MRN: [id number]  REQUESTING PHYSICIAN: Demetrios Loll, MD  Chief Complaint: Right hip pain  HPI: Ana Washington is a 73 y.o. female who tripped over her oxygen tubing and fell, landing on her right hip and side.  She was unable to stand or bear weight due to the right hip pain.  She denied any loss of consciousness or head injury.  She denied any other injuries.  Of note, the patient did report some worsening cough and shortness of breath but denied any fevers or chills.  Past Medical History:  Diagnosis Date  . Arthritis   . Atrial fibrillation ([place])   . Bronchiectasis ([place])   . CAD (coronary artery disease)   . CAD (coronary artery disease) 07/30/2017  . Dumping syndrome   . Dyspnea   . Dysrhythmia    afib  . 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 Family history of adverse reaction to anesthesia    sister [name]  . GERD (gastroesophageal reflux disease)   . Headache    MIGRAINES  . Hypertension   . Lung disease   . Myocardial infarction ([place]) 2007   Non-STEMI  . [name] (postoperative nausea and vomiting)   . Psoriasis   . PUD (peptic ulcer disease)    Past Surgical History:  Procedure Laterality Date  . BACK SURGERY    . CHOLECYSTECTOMY    . EYE SURGERY    . FOOT SURGERY    . KYPHOPLASTY N/A 07/05/2016   Procedure: KYPHOPLASTY T - 9;  Surgeon: Hessie Knows, MD;  Location: [facility];  Service: Orthopedics;  Laterality: N/A;  . KYPHOPLASTY N/A 11/29/2017   Procedure: Iona Hansen;  Surgeon: Hessie Knows, MD;  Location: [facility];  Service: Orthopedics;  Laterality: N/A;  L2 and L3  . KYPHOPLASTY N/A 12/18/2017   Procedure: KYPHOPLASTY L1;  Surgeon: Hessie Knows, MD;  Location: [facility];  Service: Orthopedics;  Laterality: N/A;  . KYPHOPLASTY N/A 01/05/2018   Procedure: KYPHOPLASTY-T11,T12;  Surgeon: Hessie Knows, MD;  Location: [facility];  Service: Orthopedics;  Laterality: N/A;  . KYPHOPLASTY  N/A 04/05/2018   Procedure: T10 KYPHOPLASTY;  Surgeon: Hessie Knows, MD;  Location: [facility];  Service: Orthopedics;  Laterality: N/A;  . KYPHOPLASTY N/A 04/12/2018   Procedure: KYPHOPLASTY T7,8;  Surgeon: Hessie Knows, MD;  Location: [facility];  Service: Orthopedics;  Laterality: N/A;  . KYPHOPLASTY N/A 04/19/2018   Procedure: KYPHOPLASTY T5, T6;  Surgeon: Hessie Knows, MD;  Location: [facility];  Service: Orthopedics;  Laterality: N/A;  . LUNG SURGERY  1990 and 1996  . THOROCOTOMY WITH LOBECTOMY     LEFT LOWER THORACOTOMY / RIGHT MIDDLE LOBECTOMY   Social History   Socioeconomic History  . Marital status: Married    Spouse name: Not on file  . Number of children: Not on file  . Years of education: Not on file  . Highest education level: Not on file  Occupational History  . Not on file  Social Needs  . Financial resource strain: Not on file  . Food insecurity    Worry: Not on file    Inability: Not on file  . Transportation needs    Medical: Not on file    Non-medical: Not on file  Tobacco Use  . Smoking status: Never Smoker  . Smokeless tobacco: Never Used  Substance and Sexual Activity  . Alcohol use: No  . Drug use: No  . Sexual activity: Not on file  Lifestyle  .  Physical activity    Days per week: Not on file    Minutes per session: Not on file  . Stress: Not on file  Relationships  . Social Herbalist on phone: Not on file    Gets together: Not on file    Attends religious service: Not on file    Active member of club or organization: Not on file    Attends meetings of clubs or organizations: Not on file    Relationship status: Not on file  Other Topics Concern  . Not on file  Social History Narrative  . Not on file   Family History  Problem Relation Age of Onset  . Hypertension Mother   . Hypertension Father    Allergies  Allergen Reactions  . Sulfa Antibiotics Diarrhea  . Codeine Nausea And Vomiting  . Penicillins Rash    Has patient had a  PCN reaction causing immediate rash, facial/tongue/throat swelling, SOB or lightheadedness with hypotension: Unknown Has patient had a PCN reaction causing severe rash involving mucus membranes or skin necrosis: Unknown Has patient had a PCN reaction that required hospitalization: Unknown Has patient had a PCN reaction occurring within the last 10 years: No If all of the above answers are "NO", then may proceed with Cephalosporin use.    Prior to Admission medications   Medication Sig Start Date End Date Taking? Authorizing Provider  acetaminophen (TYLENOL) 500 MG tablet Take 1,000 mg by mouth every 6 (six) hours as needed for mild pain or headache.    Yes [provider]  amLODipine (NORVASC) 5 MG tablet Take 5 mg by mouth daily.  11/04/13  Yes [provider]  apixaban (ELIQUIS) 5 MG TABS tablet Take 5 mg by mouth 2 (two) times daily.    Yes [provider]  Ascorbic Acid (VITAMIN C) 1000 MG tablet Take 1,000 mg by mouth daily.   Yes [provider]  aspirin EC 81 MG tablet Take 81 mg by mouth daily.   Yes [provider]  azithromycin (ZITHROMAX) 250 MG tablet Take 250 mg by mouth daily.    Yes [provider]  Calcium Carbonate-Vitamin D (OYSTER SHELL/VITAMIN D) 600-125 MG-UNIT TABS Take 1 tablet by mouth daily. 11/05/07  Yes [provider]  chlorpheniramine-HYDROcodone (TUSSIONEX) 10-8 MG/5ML SUER Take 5 mLs by mouth at bedtime.  04/24/18  Yes [provider]  ciprofloxacin (CIPRO) 500 MG tablet Take 500 mg by mouth 2 (two) times daily.   Yes [provider]  diltiazem (CARDIZEM CD) 120 MG 24 hr capsule Take 120 mg by mouth daily. 09/17/18  Yes [provider]  diltiazem (CARDIZEM) 30 MG tablet Take 30 mg by mouth as needed for shortness of breath. 07/02/18  Yes [provider]  diltiazem (CARDIZEM) 60 MG tablet Take 1 tablet (60 mg total) by mouth every 12 (twelve) hours. 04/13/18  Yes Demetrios Loll,  MD  ferrous sulfate 325 (65 FE) MG EC tablet Take 325 mg by mouth daily.    Yes [provider]  gabapentin (NEURONTIN) 300 MG capsule Take 300 mg by mouth 4 (four) times daily.    Yes [provider]  LORazepam (ATIVAN) 0.5 MG tablet Take 0.5 mg by mouth every 4 (four) hours.    Yes [provider]  losartan (COZAAR) 100 MG tablet Take 100 mg by mouth daily.  07/10/13  Yes [provider]  metoprolol succinate (TOPROL-XL) 50 MG 24 hr tablet Take 50 mg by mouth  at bedtime.  10/17/13  Yes [provider]  potassium chloride (K-DUR) 10 MEQ tablet Take 10 mEq by mouth daily.   Yes [provider]  Probiotic Product (ALIGN) 4 MG CAPS Take 4 mg by mouth daily.   Yes [provider]  Sodium Chloride, Inhalant, 7 % NEBU Inhale 4 mLs into the lungs 3 (three) times daily. 04/05/18  Yes [provider]  sucralfate (CARAFATE) 1 g tablet Take 1 g by mouth 4 (four) times daily.   Yes [provider]  pantoprazole (PROTONIX) 40 MG tablet Take 1 tablet (40 mg total) by mouth daily. 04/15/18 06/14/18  Henreitta Leber, MD   Mr Pelvis Wo Contrast  Result Date: 09/27/2018 CLINICAL DATA:  Right hip pain after a fall. Abnormal radiographs suggesting right sacral fracture. EXAM: MRI PELVIS WITHOUT CONTRAST TECHNIQUE: Multiplanar multisequence MR imaging of the pelvis was performed. No intravenous contrast was administered. COMPARISON:  Radiographs dated 09/27/2018 FINDINGS: Musculoskeletal: There is an acute nondisplaced intertrochanteric fracture of the proximal right femur. There is an adjacent tear an intramuscular hematoma in the right gluteus maximus muscle. Is hemorrhage into the right greater trochanteric bursa and into the vastus lateralis muscle. There is also a small strain or tear of the proximal right gluteus maximus muscle. There is a subtle fracture of the third sacral segment extending into the sacral ala I to the right and left.  There are subtle nondisplaced fractures of right and left pubic bodies. There are old healed fractures of the right inferior and superior pubic rami. Urinary Tract:  No abnormality visualized. Bowel:  Numerous diverticula in distal colon.  Otherwise negative. Vascular/Lymphatic: No pathologically enlarged lymph nodes. No significant vascular abnormality seen. Reproductive:  No mass or other significant abnormality Other:  None. IMPRESSION: 1. Acute nondisplaced intertrochanteric fracture of the proximal right femur. 2. Nondisplaced subtle fractures of the S3 segment of the sacrum and of both pubic bodies. 3. Intramuscular tear and small hematoma and strain of the right gluteus maximus muscle. Small hematoma extending into the vastus lateralis muscle. Electronically Signed   By: Lorriane Shire M.D.   On: 09/27/2018 16:45   Dg Chest Port 1 View  Result Date: 09/27/2018 CLINICAL DATA:  COPD, fall EXAM: PORTABLE CHEST 1 VIEW COMPARISON:  10/09/2017 FINDINGS: Cardiac silhouette appears within normal limits. Calcific aortic knob. Diffuse interstitial thickening throughout both lungs. Prominent right suprahilar opacity, which appears more prominent compared to prior. There is hazy left basilar opacity, some of which may represent a component of hiatal hernia, better seen on prior exam. No pneumothorax is seen. The bones are diffusely demineralized. There are multiple thoracic spine compression deformity status post cement augmentation. No definite acute osseous abnormality within the limitations of this exam. IMPRESSION: 1. Prominent right suprahilar opacity, new from prior. Further evaluation with contrast-enhanced CT of the chest is recommended. 2. Hazy left basilar opacity. Electronically Signed   By: Davina Poke M.D.   On: 09/27/2018 13:59   Dg Hip Unilat With Pelvis 2-3 Views Right  Result Date: 09/27/2018 CLINICAL DATA:  73 year old female with a history of right hip pain after a fall EXAM: DG HIP (WITH  OR WITHOUT PELVIS) 2-3V RIGHT COMPARISON:  None. FINDINGS: Osteopenia. Bony pelvic ring appears intact. Partially healed right inferior pubic ramus fracture. Disruption right-sided sacral foramen. Nondisplaced right intertrochanteric fracture is identified on the cross-table lateral view. Changes of prior vertebral augmentation. IMPRESSION: Nondisplaced right intertrochanteric fracture, best seen on the cross-table lateral view Questionable acute right sacral  fracture with disruption of the sacral foramen. Further evaluation with pelvic MRI may be useful. Incomplete healing/remodeling of right inferior pubic ramus fracture. Osteopenia. Electronically Signed   By: Corrie Mckusick D.O.   On: 09/27/2018 14:04    Positive ROS: All other systems have been reviewed and were otherwise negative with the exception of those mentioned in the HPI and as above.  Physical Exam: General: Well developed, well nourished female seen in no acute distress. HEENT: Atraumatic and normocephalic. Sclera are clear. Extraocular motion is intact. Oropharynx is clear with moist mucosa. Neck: Supple, nontender, good range of motion.  Lungs: The patient is on a rebreather mask.  Bilateral rhonchi. Cardiovascular: Irregular rate and rhythm with normal S1 and S2. No murmurs. No gallops or rubs. Pedal pulses are palpable bilaterally. Homans test is negative bilaterally. No significant pretibial or ankle edema. Abdomen: Soft, nontender, and nondistended. Bowel sounds are present. Skin: No lesions in the area of chief complaint Neurologic: Awake, alert, and oriented. Sensory function is grossly intact. Motor strength is felt to be 5 over 5 bilaterally with the exception of the right lower extremity that was not assessed secondary to the injury. No clonus or tremor. Good motor coordination. Lymphatic: No axillary or cervical lymphadenopathy  MUSCULOSKELETAL: The right lower extremity is internally rotated.  Pain is to the right hip is  elicited with any attempted range of motion or logroll.  No gross tenderness to palpation about the knee.  No knee effusion.  No tenderness about the right foot or ankle.  Assessment: Right intertrochanteric femur fracture Nondisplaced fractures of the right and left pubic bodies  Nondisplaced sacral (S3) fracture  Plan: The findings were discussed in detail with the patient and her sister.  Recommendation was made for open reduction and internal fixation of the right intertrochanteric femur fracture.The usual perioperative course was discussed. The risks and benefits of surgical intervention were reviewed. The patient expressed understanding of the risks and benefits and agreed with plans for surgical intervention.   Pulmonology and cardiology consults are pending.  Given the patient's pneumonia and acute on chronic respiratory failure, I would like to have the patient's pulmonary status optimized.  I have also discussed the patient's status with Anesthesiology and will delay surgery for a total of 72 hours so that they may safely consider regional anesthesia.  I have tentatively scheduled the patient for surgery on Sunday.  Shirley Bolle P. Holley Bouche M.D.

## 2018-09-27 NOTE — ED Notes (Signed)
Report called to [facility]

## 2018-09-27 NOTE — H&P (Addendum)
[facility] NAME: Ana Washington    MR#:  [id number]  DATE OF BIRTH:  September 19, 1945  DATE OF ADMISSION:  09/27/2018  PRIMARY CARE PHYSICIAN: [name], MD   REQUESTING/REFERRING PHYSICIAN: Dr. Jimmye Norman.  CHIEF COMPLAINT:   Chief Complaint  Patient presents with  . Fall  . Shortness of Breath   Fall and shortness of breath today. HISTORY OF PRESENT ILLNESS:  Ana Washington  is a 73 y.o. female with a known history of CAD, chronic respiratory failure on home oxygen 2 L, COPD, A. fib, hypertension, GERD, MI, PUD, etc.  The patient tripped over her oxygen tubing and fell into her right side.  She denies any syncope or loss consciousness, no chest pain or palpitation.   She denies other injury.  The patient complains of worsening cough and shortness of breath but no fever or chills.  She also complains of burning sensation when she urinates. She complains of right hip pain.  X-ray report right hip fracture.  Chest x-ray : Prominent right suprahilar opacity, new from prior.  Hazy left basilar opacity. Dr. Marry Guan planned surgery tomorrow.  ED physician request admission. PAST MEDICAL HISTORY:   Past Medical History:  Diagnosis Date  . Arthritis   . Atrial fibrillation ([place])   . Bronchiectasis ([place])   . CAD (coronary artery disease)   . CAD (coronary artery disease) 07/30/2017  . Dumping syndrome   . Dyspnea   . Dysrhythmia    afib  . 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 Family history of adverse reaction to anesthesia    sister [name]  . GERD (gastroesophageal reflux disease)   . Headache    MIGRAINES  . Hypertension   . Lung disease   . Myocardial infarction ([place]) 2007   Non-STEMI  . [name] (postoperative nausea and vomiting)   . Psoriasis   . PUD (peptic ulcer disease)     PAST SURGICAL HISTORY:   Past Surgical History:  Procedure Laterality Date  . BACK SURGERY    . CHOLECYSTECTOMY    . EYE SURGERY    .  FOOT SURGERY    . KYPHOPLASTY N/A 07/05/2016   Procedure: KYPHOPLASTY T - 9;  Surgeon: Hessie Knows, MD;  Location: [facility] ORS;  Service: Orthopedics;  Laterality: N/A;  . KYPHOPLASTY N/A 11/29/2017   Procedure: Iona Hansen;  Surgeon: Hessie Knows, MD;  Location: [facility];  Service: Orthopedics;  Laterality: N/A;  L2 and L3  . KYPHOPLASTY N/A 12/18/2017   Procedure: KYPHOPLASTY L1;  Surgeon: Hessie Knows, MD;  Location: [facility];  Service: Orthopedics;  Laterality: N/A;  . KYPHOPLASTY N/A 01/05/2018   Procedure: KYPHOPLASTY-T11,T12;  Surgeon: Hessie Knows, MD;  Location: [facility];  Service: Orthopedics;  Laterality: N/A;  . KYPHOPLASTY N/A 04/05/2018   Procedure: T10 KYPHOPLASTY;  Surgeon: Hessie Knows, MD;  Location: [facility];  Service: Orthopedics;  Laterality: N/A;  . KYPHOPLASTY N/A 04/12/2018   Procedure: KYPHOPLASTY T7,8;  Surgeon: Hessie Knows, MD;  Location: [facility];  Service: Orthopedics;  Laterality: N/A;  . KYPHOPLASTY N/A 04/19/2018   Procedure: KYPHOPLASTY T5, T6;  Surgeon: Hessie Knows, MD;  Location: [facility];  Service: Orthopedics;  Laterality: N/A;  . LUNG SURGERY  1990 and 1996  . THOROCOTOMY WITH LOBECTOMY     LEFT LOWER THORACOTOMY / RIGHT MIDDLE LOBECTOMY    SOCIAL HISTORY:   Social History   Tobacco Use  . Smoking status: Never Smoker  . Smokeless tobacco: Never Used  Substance Use Topics  . Alcohol use: No    FAMILY HISTORY:   Family History  Problem Relation Age of Onset  . Hypertension Mother   . Hypertension Father     DRUG ALLERGIES:   Allergies  Allergen Reactions  . Sulfa Antibiotics Diarrhea  . Codeine Nausea And Vomiting  . Penicillins Rash    Has patient had a PCN reaction causing immediate rash, facial/tongue/throat swelling, SOB or lightheadedness with hypotension: Unknown Has patient had a PCN reaction causing severe rash involving mucus membranes or skin necrosis: Unknown Has patient had a PCN reaction that required  hospitalization: Unknown Has patient had a PCN reaction occurring within the last 10 years: No If all of the above answers are "NO", then may proceed with Cephalosporin use.     REVIEW OF SYSTEMS:   Review of Systems  Constitutional: Negative for chills, fever and malaise/fatigue.  HENT: Negative for sore throat.   Eyes: Negative for blurred vision and double vision.  Respiratory: Negative for cough, hemoptysis, shortness of breath, wheezing and stridor.   Cardiovascular: Negative for chest pain, palpitations, orthopnea and leg swelling.  Gastrointestinal: Negative for abdominal pain, blood in stool, diarrhea, melena, nausea and vomiting.  Genitourinary: Negative for dysuria, flank pain and hematuria.  Musculoskeletal: Positive for joint pain. Negative for back pain.  Neurological: Negative for dizziness, sensory change, focal weakness, seizures, loss of consciousness, weakness and headaches.  Endo/Heme/Allergies: Negative for polydipsia.  Psychiatric/Behavioral: Negative for depression. The patient is not nervous/anxious.     MEDICATIONS AT HOME:   Prior to Admission medications   Medication Sig Start Date End Date Taking? Authorizing Provider  acetaminophen (TYLENOL) 500 MG tablet Take 1,000 mg by mouth every 6 (six) hours as needed for mild pain or headache.    Yes [provider]  amLODipine (NORVASC) 5 MG tablet Take 5 mg by mouth daily.  11/04/13  Yes [provider]  apixaban (ELIQUIS) 5 MG TABS tablet Take 5 mg by mouth 2 (two) times daily.    Yes [provider]  Ascorbic Acid (VITAMIN C) 1000 MG tablet Take 1,000 mg by mouth daily.   Yes [provider]  aspirin EC 81 MG tablet Take 81 mg by mouth daily.   Yes [provider]  azithromycin (ZITHROMAX) 250 MG tablet Take 250 mg by mouth daily.    Yes [provider]  Calcium Carbonate-Vitamin D (OYSTER SHELL/VITAMIN D) 600-125 MG-UNIT TABS Take 1 tablet by mouth daily.  11/05/07  Yes [provider]  chlorpheniramine-HYDROcodone (TUSSIONEX) 10-8 MG/5ML SUER Take 5 mLs by mouth at bedtime.  04/24/18  Yes [provider]  ciprofloxacin (CIPRO) 500 MG tablet Take 500 mg by mouth 2 (two) times daily.   Yes [provider]  diltiazem (CARDIZEM CD) 120 MG 24 hr capsule Take 120 mg by mouth daily. 09/17/18  Yes [provider]  diltiazem (CARDIZEM) 30 MG tablet Take 30 mg by mouth as needed for shortness of breath. 07/02/18  Yes [provider]  diltiazem (CARDIZEM) 60 MG tablet Take 1 tablet (60 mg total) by mouth every 12 (twelve) hours. 04/13/18  Yes Demetrios Loll, MD  ferrous sulfate 325 (65 FE) MG EC tablet Take 325 mg by mouth daily.    Yes [provider]  gabapentin (NEURONTIN) 300 MG capsule Take 300 mg by mouth 4 (four) times daily.    Yes [provider]  LORazepam (ATIVAN) 0.5 MG tablet Take 0.5 mg by mouth every 4 (four) hours.  Yes [provider]  losartan (COZAAR) 100 MG tablet Take 100 mg by mouth daily.  07/10/13  Yes [provider]  metoprolol succinate (TOPROL-XL) 50 MG 24 hr tablet Take 50 mg by mouth at bedtime.  10/17/13  Yes [provider]  potassium chloride (K-DUR) 10 MEQ tablet Take 10 mEq by mouth daily.   Yes [provider]  Probiotic Product (ALIGN) 4 MG CAPS Take 4 mg by mouth daily.   Yes [provider]  Sodium Chloride, Inhalant, 7 % NEBU Inhale 4 mLs into the lungs 3 (three) times daily. 04/05/18  Yes [provider]  sucralfate (CARAFATE) 1 g tablet Take 1 g by mouth 4 (four) times daily.   Yes [provider]  pantoprazole (PROTONIX) 40 MG tablet Take 1 tablet (40 mg total) by mouth daily. 04/15/18 06/14/18  Henreitta Leber, MD      VITAL SIGNS:  Blood pressure 136/79, pulse 92, temperature 98 F (36.7 C), temperature source Oral, resp. rate (!) 25, height 5\' 5"  (1.651 m), weight 47.6 kg, SpO2 100 %.  PHYSICAL  EXAMINATION:  Physical Exam  GENERAL:  73 y.o.-year-old patient lying in the bed with no acute distress.  Looks fragile. EYES: Pupils equal, round, reactive to light and accommodation. No scleral icterus. Extraocular muscles intact.  HEENT: Head atraumatic, normocephalic. Oropharynx and nasopharynx clear.  NECK:  Supple, no jugular venous distention. No thyroid enlargement, no tenderness.  LUNGS: Bilateral crackles and,rhonchi, no wheezing. No use of accessory muscles of respiration.  CARDIOVASCULAR: S1, S2 normal. No murmurs, rubs, or gallops.  ABDOMEN: Soft, nontender, nondistended. Bowel sounds present. No organomegaly or mass.  EXTREMITIES: No pedal edema, cyanosis, or clubbing.  Unable to move right lower extremity. NEUROLOGIC: Cranial nerves II through XII are intact. Muscle strength 5/5 in all extremities except the right leg. Sensation intact. Gait not checked.  PSYCHIATRIC: The patient is alert and oriented x 3.  SKIN: No obvious rash, lesion, or ulcer.   LABORATORY PANEL:   CBC Recent Labs  Lab 09/27/18 1309  WBC 18.7*  HGB 12.9  HCT 38.3  PLT 318   ------------------------------------------------------------------------------------------------------------------  Chemistries  Recent Labs  Lab 09/27/18 1309  NA 141  K 3.0*  CL 93*  CO2 35*  GLUCOSE 118*  BUN 14  CREATININE 0.58  CALCIUM 9.3  AST 30  ALT 20  ALKPHOS 49  BILITOT 0.7   ------------------------------------------------------------------------------------------------------------------  Cardiac Enzymes No results for input(s): TROPONINI in the last 168 hours. ------------------------------------------------------------------------------------------------------------------  RADIOLOGY:  Dg Chest Port 1 View  Result Date: 09/27/2018 CLINICAL DATA:  COPD, fall EXAM: PORTABLE CHEST 1 VIEW COMPARISON:  10/09/2017 FINDINGS: Cardiac silhouette appears within normal limits. Calcific aortic knob. Diffuse  interstitial thickening throughout both lungs. Prominent right suprahilar opacity, which appears more prominent compared to prior. There is hazy left basilar opacity, some of which may represent a component of hiatal hernia, better seen on prior exam. No pneumothorax is seen. The bones are diffusely demineralized. There are multiple thoracic spine compression deformity status post cement augmentation. No definite acute osseous abnormality within the limitations of this exam. IMPRESSION: 1. Prominent right suprahilar opacity, new from prior. Further evaluation with contrast-enhanced CT of the chest is recommended. 2. Hazy left basilar opacity. Electronically Signed   By: Davina Poke M.D.   On: 09/27/2018 13:59   Dg Hip Unilat With Pelvis 2-3 Views Right  Result Date: 09/27/2018 CLINICAL DATA:  73 year old female with a history of right hip pain after a  fall EXAM: DG HIP (WITH OR WITHOUT PELVIS) 2-3V RIGHT COMPARISON:  None. FINDINGS: Osteopenia. Bony pelvic ring appears intact. Partially healed right inferior pubic ramus fracture. Disruption right-sided sacral foramen. Nondisplaced right intertrochanteric fracture is identified on the cross-table lateral view. Changes of prior vertebral augmentation. IMPRESSION: Nondisplaced right intertrochanteric fracture, best seen on the cross-table lateral view Questionable acute right sacral fracture with disruption of the sacral foramen. Further evaluation with pelvic MRI may be useful. Incomplete healing/remodeling of right inferior pubic ramus fracture. Osteopenia. Electronically Signed   By: Corrie Mckusick D.O.   On: 09/27/2018 14:04      IMPRESSION AND PLAN:   Right hip fracture. The patient will be admitted to medical floor. The patient has moderate to high risk for surgery.  Cardiology consult for clearance. N.p.o. after midnight for surgery per Dr. Barnet Pall. Hold Eliquis. Pain control, DVT prophylaxis and PT evaluation after surgery.  Acute on  chronic chronic respiratory failure with hypoxia on home oxygen 2 L due to pneumonia and COPD. The patient is found hypoxia, oxygen is increased to 5 L just now. Continue oxygen by nasal cannula, DuoNeb every 6 hours, Robitussin as needed.  Pulmonary consult.  Haikoued to Dr. Lanney Gins.  Pneumonia.  CAP. Start Zithromax and Rocephin, Robitussin as needed.  Hypokalemia.  Potassium supplement and follow-up level.  Leukocytosis.  Possible due to pneumonia or reaction.  Follow-up urinalysis.  History of A. fib, on Coumadin.  The patient took Coumadin today.  Hold Coumadin for surgery. Cardiology consult for clearance.  Hypertension.  Continue Lopressor and Cardizem, hold Norvasc and losartan due to normal blood pressure.  COPD.  Stable.  All the records are reviewed and case discussed with ED provider. Management plans discussed with the patient, her sister and they are in agreement.  CODE STATUS:   TOTAL TIME TAKING CARE OF THIS PATIENT: 62 minutes.    Demetrios Loll M.D on 09/27/2018 at 4:08 PM  Between 7am to 6pm - Pager - [phone]  After 6pm go to [email or web address] - Proofreader  [facility]  Office  [phone]  CC: Primary care physician; [name], MD   Note: This dictation was prepared with Dragon dictation along with smaller phrase technology. Any transcriptional errors that result from this process are unin

## 2018-09-27 NOTE — ED Notes (Signed)
ED TO INPATIENT HANDOFF REPORT  ED Nurse Name and Phone #: Gor Vestal  S Name/Age/Gender Ana Washington 73 y.o. female Room/Bed: ED01A/ED01A  Code Status   Code Status: Prior  Home/SNF/Other Home Patient oriented to: self, place, time and situation Is this baseline? Yes   Triage Complete: Triage complete  Chief Complaint fall  Triage Note Patient from home via [facility]. Patient states she was walking to get a breathing treatment when she tripped over her oxygen cord and fell. Now complaining of pain in right hip and unable to straighten right leg. Patient states she woke up with worsening SOB and cough this morning. History of COPD.   Allergies Allergies  Allergen Reactions  . Sulfa Antibiotics Diarrhea  . Codeine Nausea And Vomiting  . Penicillins Rash    Has patient had a PCN reaction causing immediate rash, facial/tongue/throat swelling, SOB or lightheadedness with hypotension: Unknown Has patient had a PCN reaction causing severe rash involving mucus membranes or skin necrosis: Unknown Has patient had a PCN reaction that required hospitalization: Unknown Has patient had a PCN reaction occurring within the last 10 years: No If all of the above answers are "NO", then may proceed with Cephalosporin use.     Level of Care/Admitting Diagnosis ED Disposition    ED Disposition Condition [facility] Area: [place] [[id number]]  Level of Care: Med-Surg [16]  Covid Evaluation: Confirmed COVID Negative  Diagnosis: Closed right hip fracture [facility]) [id number]  Admitting Physician: Demetrios Loll [id number]  Attending Physician: Demetrios Loll [id number]  Estimated length of stay: 3 - 4 days  Certification:: I certify this patient will need inpatient services for at least 2 midnights  PT Class (Do Not Modify): Inpatient [101]  PT Acc Code (Do Not Modify): Private [1]       B Medical/Surgery History Past Medical History:  Diagnosis Date  . Arthritis   .  Atrial fibrillation ([place])   . Bronchiectasis ([place])   . CAD (coronary artery disease)   . CAD (coronary artery disease) 07/30/2017  . Dumping syndrome   . Dyspnea   . Dysrhythmia    afib  . 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 Family history of adverse reaction to anesthesia    sister [name]  . GERD (gastroesophageal reflux disease)   . Headache    MIGRAINES  . Hypertension   . Lung disease   . Myocardial infarction ([place]) 2007   Non-STEMI  . [name] (postoperative nausea and vomiting)   . Psoriasis   . PUD (peptic ulcer disease)    Past Surgical History:  Procedure Laterality Date  . BACK SURGERY    . CHOLECYSTECTOMY    . EYE SURGERY    . FOOT SURGERY    . KYPHOPLASTY N/A 07/05/2016   Procedure: KYPHOPLASTY T - 9;  Surgeon: Hessie Knows, MD;  Location: [facility] ORS;  Service: Orthopedics;  Laterality: N/A;  . KYPHOPLASTY N/A 11/29/2017   Procedure: Iona Hansen;  Surgeon: Hessie Knows, MD;  Location: [facility] ORS;  Service: Orthopedics;  Laterality: N/A;  L2 and L3  . KYPHOPLASTY N/A 12/18/2017   Procedure: KYPHOPLASTY L1;  Surgeon: Hessie Knows, MD;  Location: [facility] ORS;  Service: Orthopedics;  Laterality: N/A;  . KYPHOPLASTY N/A 01/05/2018   Procedure: KYPHOPLASTY-T11,T12;  Surgeon: Hessie Knows, MD;  Location: [facility] ORS;  Service: Orthopedics;  Laterality: N/A;  . KYPHOPLASTY N/A 04/05/2018   Procedure: T10 KYPHOPLASTY;  Surgeon: Hessie Knows, MD;  Location: [facility] ORS;  Service: Orthopedics;  Laterality:  N/A;  . KYPHOPLASTY N/A 04/12/2018   Procedure: KYPHOPLASTY T7,8;  Surgeon: Hessie Knows, MD;  Location: [facility] OR[facility];  Service: Orthopedics;  Laterality: N/A;  . KYPHOPLASTY N/A 04/19/2018   Procedure: KYPHOPLASTY T5, T6;  Surgeon: Hessie Knows, MD;  Location: [facility] ORS;  Service: Orthopedics;  Laterality: N/A;  . LUNG SURGERY  1990 and 1996  . THOROCOTOMY WITH LOBECTOMY     LEFT LOWER THORACOTOMY / RIGHT MIDDLE LOBECTOMY     A IV Location/Drains/Wounds Patient  Lines/Drains/Airways Status   Active Line/Drains/Airways    Name:   Placement date:   Placement time:   Site:   Days:   Peripheral IV 09/27/18 Left Forearm   09/27/18    1733    Forearm   less than 1   External Urinary Catheter   04/09/18    2030    -   171   Incision (Closed) 04/05/18 Back Other (Comment)   04/05/18    1245     175   Incision (Closed) 04/12/18 Back   04/12/18    1327     168   Incision (Closed) 04/19/18 Back   04/19/18    1018     161          Intake/Output Last 24 hours No intake or output data in the 24 hours ending 09/27/18 1741  Labs/Imaging Results for orders placed or performed during the hospital encounter of 09/27/18 (from the past 48 hour(s))  Comprehensive metabolic panel     Status: Abnormal   Collection Time: 09/27/18  1:09 PM  Result Value Ref Range   Sodium 141 135 - 145 mmol/L   Potassium 3.0 (L) 3.5 - 5.1 mmol/L   Chloride 93 (L) 98 - 111 mmol/L   CO2 35 (H) 22 - 32 mmol/L   Glucose, Bld 118 (H) 70 - 99 mg/dL   BUN 14 8 - 23 mg/dL   Creatinine, Ser 0.58 0.44 - 1.00 mg/dL   Calcium 9.3 8.9 - 10.3 mg/dL   Total Protein 6.6 6.5 - 8.1 g/dL   Albumin 3.5 3.5 - 5.0 g/dL   AST 30 15 - 41 U/L   ALT 20 0 - 44 U/L   Alkaline Phosphatase 49 38 - 126 U/L   Total Bilirubin 0.7 0.3 - 1.2 mg/dL   GFR calc non Af Amer >60 >60 mL/min   GFR calc Af Amer >60 >60 mL/min   Anion gap 13 5 - 15    Comment: Performed at [facility], [address] [place], [place] [address]  Brain natriuretic peptide     Status: Abnormal   Collection Time: 09/27/18  1:09 PM  Result Value Ref Range   B Natriuretic Peptide 108.0 (H) 0.0 - 100.0 pg/mL    Comment: Performed at [facility], [place], [place] [address]  Troponin I (High Sensitivity)     Status: Abnormal   Collection Time: 09/27/18  1:09 PM  Result Value Ref Range   Troponin I (High Sensitivity) 26 (H) <18 ng/L    Comment: (NOTE) Elevated high sensitivity troponin I (hsTnI)  values and significant  changes across serial measurements may suggest ACS but many other  chronic and acute conditions are known to elevate hsTnI results.  Refer to the "Links" section for chest pain algorithms and additional  guidance. Performed at [facility], [address], [place], [place] [address]   CBC with Differential     Status: Abnormal   Collection Time:  09/27/18  1:09 PM  Result Value Ref Range   WBC 18.7 (H) 4.0 - 10.5 K/uL   RBC 3.78 (L) 3.87 - 5.11 MIL/uL   Hemoglobin 12.9 12.0 - 15.0 g/dL   HCT 38.3 36.0 - 46.0 %   MCV 101.3 (H) 80.0 - 100.0 fL   MCH 34.1 (H) 26.0 - 34.0 pg   MCHC 33.7 30.0 - 36.0 g/dL   RDW 12.0 11.5 - 15.5 %   Platelets 318 150 - 400 K/uL   nRBC 0.0 0.0 - 0.2 %   Neutrophils Relative % 77 %   Neutro Abs 14.6 (H) 1.7 - 7.7 K/uL   Lymphocytes Relative 14 %   Lymphs Abs 2.6 0.7 - 4.0 K/uL   Monocytes Relative 4 %   Monocytes Absolute 0.8 0.1 - 1.0 K/uL   Eosinophils Relative 2 %   Eosinophils Absolute 0.4 0.0 - 0.5 K/uL   Basophils Relative 1 %   Basophils Absolute 0.1 0.0 - 0.1 K/uL   Immature Granulocytes 2 %   Abs Immature Granulocytes 0.28 (H) 0.00 - 0.07 K/uL    Comment: Performed at [facility], [address] [place], [place] [address]  Blood gas, venous     Status: Abnormal   Collection Time: 09/27/18  2:11 PM  Result Value Ref Range   pH, Ven 7.43 7.250 - 7.430   pCO2, Ven 65 (H) 44.0 - 60.0 mmHg   pO2, Ven 32.0 32.0 - 45.0 mmHg   Patient temperature 37.0    Collection site VENOUS    Sample type VENOUS     Comment: Performed at [facility], [address] [place], [place] [address]  SARS Coronavirus 2 [facility] order, Performed in [facility] hospital lab) Nasopharyngeal Nasopharyngeal Swab     Status: None   Collection Time: 09/27/18  2:45 PM   Specimen: Nasopharyngeal Swab  Result Value Ref Range   SARS Coronavirus 2 NEGATIVE NEGATIVE    Comment: (NOTE) If result is NEGATIVE SARS-CoV-2  target nucleic acids are NOT DETECTED. The SARS-CoV-2 RNA is generally detectable in upper and lower  respiratory specimens during the acute phase of infection. The lowest  concentration of SARS-CoV-2 viral copies this assay can detect is 250  copies / mL. A negative result does not preclude SARS-CoV-2 infection  and should not be used as the sole basis for treatment or other  patient management decisions.  A negative result may occur with  improper specimen collection / handling, submission of specimen other  than nasopharyngeal swab, presence of viral mutation(s) within the  areas targeted by this assay, and inadequate number of viral copies  (<250 copies / mL). A negative result must be combined with clinical  observations, patient history, and epidemiological information. If result is POSITIVE SARS-CoV-2 target nucleic acids are DETECTED. The SARS-CoV-2 RNA is generally detectable in upper and lower  respiratory specimens dur ing the acute phase of infection.  Positive  results are indicative of active infection with SARS-CoV-2.  Clinical  correlation with patient history and other diagnostic information is  necessary to determine patient infection status.  Positive results do  not rule out bacterial infection or co-infection with other viruses. If result is PRESUMPTIVE POSTIVE SARS-CoV-2 nucleic acids MAY BE PRESENT.   A presumptive positive result was obtained on the submitted specimen  and confirmed on repeat testing.  While 2019 novel coronavirus  (SARS-CoV-2) nucleic acids may be present in the submitted sample  additional confirmatory testing may be necessary for  epidemiological  and / or clinical management purposes  to differentiate between  SARS-CoV-2 and other Sarbecovirus currently known to infect humans.  If clinically indicated additional testing with an alternate test  methodology [id number]) is advised. The SARS-CoV-2 RNA is generally  detectable in upper and lower  respiratory sp ecimens during the acute  phase of infection. The expected result is Negative. Fact Sheet for Patients:  StrictlyIdeas.no Fact Sheet for Healthcare Providers: BankingDealers.co.za This test is not yet approved or cleared by the [place] FDA and has been authorized for detection and/or diagnosis of SARS-CoV-2 by FDA under an Emergency Use Authorization (EUA).  This EUA will remain in effect (meaning this test can be used) for the duration of the COVID-19 declaration under Section 564(b)(1) of the Act, 21 U.S.C. section 360bbb-3(b)(1), unless the authorization is terminated or revoked sooner. Performed at [facility], [place], [place] [address]   Troponin I (High Sensitivity)     Status: Abnormal   Collection Time: 09/27/18  3:08 PM  Result Value Ref Range   Troponin I (High Sensitivity) 42 (H) <18 ng/L    Comment: (NOTE) Elevated high sensitivity troponin I (hsTnI) values and significant  changes across serial measurements may suggest ACS but many other  chronic and acute conditions are known to elevate hsTnI results.  Refer to the "Links" section for chest pain algorithms and additional  guidance. Performed at [facility], [address] [place], [place] [address]    Mr Pelvis Wo Contrast  Result Date: 09/27/2018 CLINICAL DATA:  Right hip pain after a fall. Abnormal radiographs suggesting right sacral fracture. EXAM: MRI PELVIS WITHOUT CONTRAST TECHNIQUE: Multiplanar multisequence MR imaging of the pelvis was performed. No intravenous contrast was administered. COMPARISON:  Radiographs dated 09/27/2018 FINDINGS: Musculoskeletal: There is an acute nondisplaced intertrochanteric fracture of the proximal right femur. There is an adjacent tear an intramuscular hematoma in the right gluteus maximus muscle. Is hemorrhage into the right greater trochanteric bursa and into the vastus  lateralis muscle. There is also a small strain or tear of the proximal right gluteus maximus muscle. There is a subtle fracture of the third sacral segment extending into the sacral ala I to the right and left. There are subtle nondisplaced fractures of right and left pubic bodies. There are old healed fractures of the right inferior and superior pubic rami. Urinary Tract:  No abnormality visualized. Bowel:  Numerous diverticula in distal colon.  Otherwise negative. Vascular/Lymphatic: No pathologically enlarged lymph nodes. No significant vascular abnormality seen. Reproductive:  No mass or other significant abnormality Other:  None. IMPRESSION: 1. Acute nondisplaced intertrochanteric fracture of the proximal right femur. 2. Nondisplaced subtle fractures of the S3 segment of the sacrum and of both pubic bodies. 3. Intramuscular tear and small hematoma and strain of the right gluteus maximus muscle. Small hematoma extending into the vastus lateralis muscle. Electronically Signed   By: Lorriane Shire M.D.   On: 09/27/2018 16:45   Dg Chest Port 1 View  Result Date: 09/27/2018 CLINICAL DATA:  COPD, fall EXAM: PORTABLE CHEST 1 VIEW COMPARISON:  10/09/2017 FINDINGS: Cardiac silhouette appears within normal limits. Calcific aortic knob. Diffuse interstitial thickening throughout both lungs. Prominent right suprahilar opacity, which appears more prominent compared to prior. There is hazy left basilar opacity, some of which may represent a component of hiatal hernia, better seen on prior exam. No pneumothorax is seen. The bones are diffusely demineralized. There are multiple thoracic spine compression deformity status post cement  augmentation. No definite acute osseous abnormality within the limitations of this exam. IMPRESSION: 1. Prominent right suprahilar opacity, new from prior. Further evaluation with contrast-enhanced CT of the chest is recommended. 2. Hazy left basilar opacity. Electronically Signed   By:  Davina Poke M.D.   On: 09/27/2018 13:59   Dg Hip Unilat With Pelvis 2-3 Views Right  Result Date: 09/27/2018 CLINICAL DATA:  73 year old female with a history of right hip pain after a fall EXAM: DG HIP (WITH OR WITHOUT PELVIS) 2-3V RIGHT COMPARISON:  None. FINDINGS: Osteopenia. Bony pelvic ring appears intact. Partially healed right inferior pubic ramus fracture. Disruption right-sided sacral foramen. Nondisplaced right intertrochanteric fracture is identified on the cross-table lateral view. Changes of prior vertebral augmentation. IMPRESSION: Nondisplaced right intertrochanteric fracture, best seen on the cross-table lateral view Questionable acute right sacral fracture with disruption of the sacral foramen. Further evaluation with pelvic MRI may be useful. Incomplete healing/remodeling of right inferior pubic ramus fracture. Osteopenia. Electronically Signed   By: Corrie Mckusick D.O.   On: 09/27/2018 14:04    Pending Labs [employer] (From admission, onward)    Start     Ordered   Signed and Occupational hygienist morning,   R     Signed and Held   Signed and Held  CBC  Tomorrow morning,   R     Signed and Held          Vitals/Pain Today's Vitals   09/27/18 1301 09/27/18 1323 09/27/18 1415 09/27/18 1515  BP:      Pulse:   94 92  Resp:   (!) 24 (!) 25  Temp:      TempSrc:      SpO2:   100% 100%  Weight: 105 lb (47.6 kg)     Height: 5\' 5"  (1.651 m)     PainSc:  5       Isolation Precautions Airborne and Contact precautions  Medications Medications  ipratropium-albuterol (DUONEB) 0.5-2.5 (3) MG/3ML nebulizer solution 3 mL (3 mLs Nebulization Given 09/27/18 1408)  albuterol (PROVENTIL) (2.5 MG/3ML) 0.083% nebulizer solution 2.5 mg (2.5 mg Nebulization Given 09/27/18 1408)  fentaNYL (SUBLIMAZE) injection 25 mcg (25 mcg Intravenous Given 09/27/18 1401)  cefTRIAXone (ROCEPHIN) 1 g in sodium chloride 0.9 % 100 mL IVPB (0 g Intravenous Stopped 09/27/18 1517)   azithromycin (ZITHROMAX) 500 mg in sodium chloride 0.9 % 250 mL IVPB (500 mg Intravenous New Bag/Given 09/27/18 1521)  albuterol (PROVENTIL) (2.5 MG/3ML) 0.083% nebulizer solution 2.5 mg (2.5 mg Nebulization Given 09/27/18 1433)    Mobility walks with device High fall risk   Focused Assessments Cardiac Assessment Handoff:    Lab Results  Component Value Date   TROPONINI <0.03 04/07/2018   No results found for: DDIMER Does the Patient currently have chest pain? No     R Recommendations: See Admitting Provider Note  Report given to:   Additional Notes: none

## 2018-09-27 NOTE — ED Notes (Signed)
Pt to mri.  Pt alert.  Oxygen in place  meds infusing.  Pt alert.  Family in room

## 2018-09-27 NOTE — ED Notes (Signed)
Pt oxygen 81% on 4L [place]. Placed on NRB and primary nurse, Amy notified. Pt 100% at this time on NRB

## 2018-09-27 NOTE — Anesthesia Preprocedure Evaluation (Addendum)
Anesthesia Evaluation  Patient identified by MRN, date of birth, ID band Patient awake    Reviewed: Allergy & Precautions, H&P , NPO status , Patient's Chart, lab work & pertinent test results  History of Anesthesia Complications (+) [name], Family history of anesthesia reaction and history of anesthetic complications  Airway Mallampati: III  TM Distance: <3 FB Neck ROM: limited    Dental  (+) Chipped, Upper Dentures, Poor Dentition, Missing   Pulmonary shortness of breath, with exertion and Long-Term Oxygen Therapy, pneumonia, unresolved, COPD,  COPD inhaler and oxygen dependent,           Cardiovascular Exercise Tolerance: Poor hypertension, + CAD and + Past MI  + dysrhythmias Atrial Fibrillation      Neuro/Psych  Headaches,  Neuromuscular disease negative psych ROS   GI/Hepatic Neg liver ROS, PUD, GERD  Medicated and Controlled,  Endo/Other  negative endocrine ROS  Renal/GU      Musculoskeletal  (+) Arthritis ,   Abdominal   Peds  Hematology negative hematology ROS (+)   Anesthesia Other Findings Past Medical History: No date: Arthritis No date: Atrial fibrillation ([facility]) No date: Bronchiectasis ([facility]) No date: CAD (coronary artery disease) 07/30/2017: CAD (coronary artery disease) No date: Dumping syndrome No date: Dyspnea No date: Dysrhythmia     Comment:  afib 10/03/2013: Essential hypertension, malignant No date: Family history of adverse reaction to anesthesia     Comment:  sister [name] No date: GERD (gastroesophageal reflux disease) No date: Headache     Comment:  MIGRAINES No date: Hypertension No date: Lung disease 2007: Myocardial infarction ([place])     Comment:  Non-STEMI No date: [name] (postoperative nausea and vomiting) No date: Psoriasis No date: PUD (peptic ulcer disease)     Reproductive/Obstetrics negative OB ROS                           Anesthesia  Physical Anesthesia Plan  ASA: IV  Anesthesia Plan: Spinal   Post-op Pain Management:    Induction:   [name] Risk Score and Plan:   Airway Management Planned: Natural Airway and Nasal Cannula  Additional Equipment:   Intra-op Plan:   Post-operative Plan:   Informed Consent: I have reviewed the patients History and Physical, chart, labs and discussed the procedure including the risks, benefits and alternatives for the proposed anesthesia with the patient or authorized representative who has indicated his/her understanding and acceptance.   Patient has DNR.  Discussed DNR with patient and Suspend DNR.   Dental Advisory Given  Plan Discussed with: Anesthesiologist, CRNA and Surgeon  Anesthesia Plan Comments: (With the patients pulmonary (O2 dependent at home and acute on chronic COPD exacerbation) and cardiac history she is at increased risk for complications from a general anesthetic.  Case has already been delayed 3 days to have patient meet ASRA anticoagulation guidelines for neuraxial anesthesia 2/2 her Apixaban use.  Patient is now at least 72 hours out from last dose of Apixaban and greater than 24 hours out from last dose of Lovenox.  Patient had low grade fever overnight.  Patient is afebrile when checked in the PACU before the procedure.  Elevated WBC count (23) on admission but labeled as chronic leokocytosis per internal medicine note, 15 this AM which seems to be around her baseline. Discussed increased risks of spinal with patient in the setting of elevated temp and WBC thought to be 2/2 acute on chronic COPD exacerbation including but not limited to  infection of lumbar spine and or intrathecal space.   Patient is already on antibiotics.   We feel that benefits of spinal outweigh risks of general anesthesia or waiting for further optimization since the patient is a pulmonary cripple and will require restarting anticoagulation for her A Fib.   This was discussed with the  patient voiced understanding. Patient was given the option to delay surgery for further optimization and she declined and wishes to proceed with the surgery under a spinal anesthetic.  Patient has cardiac and pulmonary clearance for this procedure.   Patient reports no bleeding problems.  Plan for spinal with backup GA  Patient consented for risks of anesthesia including but not limited to:  - adverse reactions to medications - risk of bleeding, infection, nerve damage and headache - risk of failed spinal - damage to teeth, lips or other oral mucosa - sore throat or hoarseness - Damage to heart, brain, lungs or loss of life  Patient voiced understanding.)   Anesthesia Quick Evaluation

## 2018-09-27 NOTE — ED Triage Notes (Signed)
Patient from home via [facility]. Patient states she was walking to get a breathing treatment when she tripped over her oxygen cord and fell. Now complaining of pain in right hip and unable to straighten right leg. Patient states she woke up with worsening SOB and cough this morning. History of COPD.

## 2018-09-27 NOTE — ED Notes (Signed)
Patient transported to MRI 

## 2018-09-27 NOTE — ED Provider Notes (Signed)
[facility] Emergency Department Provider Note  ____________________________________________   First MD Initiated Contact with Patient 09/27/18 1303     (approximate)  I have reviewed the triage vital signs and the nursing notes.  History  Chief Complaint Fall and Shortness of Breath    HPI Ana Washington is a 73 y.o. female with a history of atrial fibrillation, COPD on home oxygen, who presents to the emergency department for a fall.  Patient states she tripped over her oxygen tubing and fell onto her right side.  She denies any preceding presyncopal symptoms including lightheadedness, chest pain, shortness of breath.  She did not hit her head.  No loss of consciousness.  She complains of right hip pain. Moderate in severity.  She reports shortness of breath, which she says is her baseline anytime her heart rate gets elevated.         Past Medical Hx Past Medical History:  Diagnosis Date  . Arthritis   . Atrial fibrillation ([place])   . Bronchiectasis ([place])   . CAD (coronary artery disease)   . CAD (coronary artery disease) 07/30/2017  . Dumping syndrome   . Dyspnea   . Dysrhythmia    afib  . 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 Family history of adverse reaction to anesthesia    sister [name]  . GERD (gastroesophageal reflux disease)   . Headache    MIGRAINES  . Hypertension   . Lung disease   . Myocardial infarction ([place]) 2007   Non-STEMI  . [name] (postoperative nausea and vomiting)   . Psoriasis   . PUD (peptic ulcer disease)     Problem List Patient Active Problem List   Diagnosis Date Noted  . Intractable nausea and vomiting 04/08/2018  . Atrial fibrillation with RVR ([place]) 04/08/2018  . Thoracic radiculitis (Bilateral) 03/29/2018  . Vasovagal episode 03/29/2018  . Closed compression fracture of T10 thoracic vertebra, sequela 03/29/2018  . Abnormal MRI, lumbar spine (12/15/2016) 03/21/2018  . Lumbar compression fractures,  sequela (L1, L2, L3, L4, and L5) 03/21/2018  . Closed compression fracture of L1 lumbar vertebra, sequela 03/21/2018  . Closed compression fracture of L2 lumbar vertebra, sequela 03/21/2018  . Closed compression fracture of L3 lumbar vertebra, sequela 03/21/2018  . Closed compression fracture of L4 lumbar vertebra, sequela 03/21/2018  . Closed compression fracture of L5 lumbar vertebra, sequela 03/21/2018  . Thoracic compression fracture, sequela (T5, T9, T10, T11, and T12) 03/21/2018  . Closed compression fracture of T5 thoracic vertebra, sequela 03/21/2018  . Closed compression fracture of T9 thoracic vertebra, sequela 03/21/2018  . Close compression fracture of T11 thoracic vertebra, sequela 03/21/2018  . Closed compression fracture of T12 thoracic vertebra, sequela 03/21/2018  . Lumbar facet hypertrophy 03/21/2018  . Grade 1  Lumbar Anterolisthesis of L3/4 and L4/5 03/21/2018  . Lumbar central spinal stenosis (Multilevel), 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 claudication 03/21/2018  . Chronic anticoagulation (ELIQUIS) 03/21/2018  . History of pelvic fracture 03/21/2018  . Chronic musculoskeletal pain 03/21/2018  . Neurogenic pain 03/21/2018  . Long term prescription benzodiazepine use 03/21/2018  . 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 Adult bronchiectasis ([place]) 03/01/2018  . Diverticulitis 03/01/2018  . 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[place]) 03/01/2018  . Migraines 03/01/2018  . 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 Psoriasis 03/01/2018  . Chronic upper back pain (Primary Area of Pain) (Bilateral) (R>L) 03/01/2018  . Chronic low back pain (Secondary Area of Pain) (Bilateral) (R>L) w/o sciatica 03/01/2018  .  Chronic pain syndrome 03/01/2018  . Long term current use of opiate analgesic 03/01/2018  . Pharmacologic therapy 03/01/2018  . Disorder of skeletal system 03/01/2018  . Problems influencing health status 03/01/2018  .  History of kyphoplasty (L1, L2, L3, T9, T11, and T12) 01/05/2018  . Protein-calorie malnutrition, severe 08/03/2017  . GERD (gastroesophageal reflux disease) 07/30/2017  . Pelvic fracture ([place]) 07/30/2017  . Paroxysmal atrial fibrillation ([place]) 07/30/2017  . Other dysphagia 09/13/2016  . Unintended weight loss 09/13/2016  . Essential hypertension 10/03/2013  . Osteoarthritis of knees (Bilateral) 10/03/2013  . Primary localized osteoarthrosis, lower leg 10/03/2013  . Non-ischemic cardiomyopathy ([place]) 09/12/2013  . Coronary artery disease involving native coronary artery of native heart without angina pectoris 07/28/2012  . Hyperlipidemia, mixed 07/28/2012  . DDD (degenerative disc disease), lumbar 02/29/2012    Past Surgical Hx Past Surgical History:  Procedure Laterality Date  . BACK SURGERY    . CHOLECYSTECTOMY    . EYE SURGERY    . FOOT SURGERY    . KYPHOPLASTY N/A 07/05/2016   Procedure: KYPHOPLASTY T - 9;  Surgeon: Hessie Knows, MD;  Location: [facility] ORS;  Service: Orthopedics;  Laterality: N/A;  . KYPHOPLASTY N/A 11/29/2017   Procedure: Iona Hansen;  Surgeon: Hessie Knows, MD;  Location: [facility] ORS;  Service: Orthopedics;  Laterality: N/A;  L2 and L3  . KYPHOPLASTY N/A 12/18/2017   Procedure: KYPHOPLASTY L1;  Surgeon: Hessie Knows, MD;  Location: [facility] ORS;  Service: Orthopedics;  Laterality: N/A;  . KYPHOPLASTY N/A 01/05/2018   Procedure: KYPHOPLASTY-T11,T12;  Surgeon: Hessie Knows, MD;  Location: [facility] ORS;  Service: Orthopedics;  Laterality: N/A;  . KYPHOPLASTY N/A 04/05/2018   Procedure: T10 KYPHOPLASTY;  Surgeon: Hessie Knows, MD;  Location: [facility] ORS;  Service: Orthopedics;  Laterality: N/A;  . KYPHOPLASTY N/A 04/12/2018   Procedure: KYPHOPLASTY T7,8;  Surgeon: Hessie Knows, MD;  Location: [facility] ORS;  Service: Orthopedics;  Laterality: N/A;  . KYPHOPLASTY N/A 04/19/2018   Procedure: KYPHOPLASTY T5, T6;  Surgeon: Hessie Knows, MD;  Location: [facility] ORS;  Service:  Orthopedics;  Laterality: N/A;  . LUNG SURGERY  1990 and 1996  . THOROCOTOMY WITH LOBECTOMY     LEFT LOWER THORACOTOMY / RIGHT MIDDLE LOBECTOMY    Medications Prior to Admission medications   Medication Sig Start Date End Date Taking? Authorizing Provider  acetaminophen (TYLENOL) 500 MG tablet Take 1,000 mg by mouth every 6 (six) hours as needed for mild pain or headache.    Yes [provider]  amLODipine (NORVASC) 5 MG tablet Take 5 mg by mouth daily.  11/04/13  Yes [provider]  apixaban (ELIQUIS) 5 MG TABS tablet Take 5 mg by mouth 2 (two) times daily.    Yes [provider]  Ascorbic Acid (VITAMIN C) 1000 MG tablet Take 1,000 mg by mouth daily.   Yes [provider]  aspirin EC 81 MG tablet Take 81 mg by mouth daily.   Yes [provider]  azithromycin (ZITHROMAX) 250 MG tablet Take 250 mg by mouth daily.    Yes [provider]  Calcium Carbonate-Vitamin D (OYSTER SHELL/VITAMIN D) 600-125 MG-UNIT TABS Take 1 tablet by mouth daily. 11/05/07  Yes [provider]  chlorpheniramine-HYDROcodone (TUSSIONEX) 10-8 MG/5ML SUER Take 5 mLs by mouth at bedtime.  04/24/18  Yes [provider]  ciprofloxacin (CIPRO) 500 MG tablet Take 500 mg by mouth 2 (two) times daily.   Yes [provider]  diltiazem (CARDIZEM CD) 120 MG 24 hr capsule Take  120 mg by mouth daily. 09/17/18  Yes [provider]  diltiazem (CARDIZEM) 30 MG tablet Take 30 mg by mouth as needed for shortness of breath. 07/02/18  Yes [provider]  diltiazem (CARDIZEM) 60 MG tablet Take 1 tablet (60 mg total) by mouth every 12 (twelve) hours. 04/13/18  Yes Demetrios Loll, MD  ferrous sulfate 325 (65 FE) MG EC tablet Take 325 mg by mouth daily.    Yes [provider]  gabapentin (NEURONTIN) 300 MG capsule Take 300 mg by mouth 4 (four) times daily.    Yes [provider]  LORazepam (ATIVAN) 0.5 MG tablet Take 0.5 mg by mouth every 4  (four) hours.    Yes [provider]  losartan (COZAAR) 100 MG tablet Take 100 mg by mouth daily.  07/10/13  Yes [provider]  metoprolol succinate (TOPROL-XL) 50 MG 24 hr tablet Take 50 mg by mouth at bedtime.  10/17/13  Yes [provider]  potassium chloride (K-DUR) 10 MEQ tablet Take 10 mEq by mouth daily.   Yes [provider]  Probiotic Product (ALIGN) 4 MG CAPS Take 4 mg by mouth daily.   Yes [provider]  Sodium Chloride, Inhalant, 7 % NEBU Inhale 4 mLs into the lungs 3 (three) times daily. 04/05/18  Yes [provider]  sucralfate (CARAFATE) 1 g tablet Take 1 g by mouth 4 (four) times daily.   Yes [provider]  pantoprazole (PROTONIX) 40 MG tablet Take 1 tablet (40 mg total) by mouth daily. 04/15/18 06/14/18  Henreitta Leber, MD    Allergies Sulfa antibiotics, Codeine, and Penicillins  Family Hx Family History  Problem Relation Age of Onset  . Hypertension Mother   . Hypertension Father     Social Hx Social History   Tobacco Use  . Smoking status: Never Smoker  . Smokeless tobacco: Never Used  Substance Use Topics  . Alcohol use: No  . Drug use: No    Review of Systems  Constitutional: Negative for fever. Negative for chills. Eyes: Negative for visual changes. ENT: Negative for sore throat. Cardiovascular: Negative for chest pain. Respiratory: + for shortness of breath. Gastrointestinal: Negative for abdominal pain. Negative for nausea. Negative for vomiting. Genitourinary: Negative for dysuria. Musculoskeletal: + Right hip pain Skin: Negative for rash. Neurological: Negative for for headaches.   Physical Exam  Vital Signs: ED Triage Vitals  Enc Vitals Group     BP 09/27/18 1300 136/79     Pulse Rate 09/27/18 1300 96     Resp 09/27/18 1300 (!) 30     Temp 09/27/18 1300 98 F (36.7 C)     Temp Source 09/27/18 1300 Oral     SpO2 09/27/18 1300 91 %     Weight 09/27/18 1301 105 lb (47.6  kg)     Height 09/27/18 1301 5\' 5"  (1.651 m)     Head Circumference --      Peak Flow --      Pain Score 09/27/18 1300 0     Pain Loc --      Pain Edu? --      Excl. in [place]? --     Constitutional: Alert and oriented.  Eyes: Conjunctivae clear. Sclera anicteric. Head: Normocephalic. Atraumatic. Nose: No congestion. No rhinorrhea. Mouth/Throat: Mucous membranes are dry.  Neck: No stridor.   Cardiovascular: Normal rate, regular rhythm. No murmurs. Extremities well perfused.  2+ symmetric distal pulses. Respiratory: Increased respiratory effort.  On nasal cannula.  Coarse wheezing throughout. Gastrointestinal: Soft and non-tender. No distention.  Musculoskeletal: Right hip tender to palpation, range of motion deferred secondary to known pain. Distally NV in tact. Neurologic:  Normal speech and language. No gross focal neurologic deficits are appreciated.  Skin: Skin is warm, dry and intact. No rash noted. Psychiatric: Mood and affect are appropriate for situation.  EKG  Personally reviewed.   Rate: 97 Rhythm: sinus Axis: normal Intervals: within normal limits No STEMI    Radiology  CXR: IMPRESSION: 1. Prominent right suprahilar opacity, new from prior. Further evaluation with contrast-enhanced CT of the chest is recommended. 2. Hazy left basilar opacity.  Hip XR: IMPRESSION: Nondisplaced right intertrochanteric fracture, best seen on the cross-table lateral view  Questionable acute right sacral fracture with disruption of the sacral foramen. Further evaluation with pelvic MRI may be useful.  Incomplete healing/remodeling of right inferior pubic ramus fracture.   Procedures  Procedure(s) performed (including critical care):  Procedures   Initial Impression / Assessment and Plan / ED Course  73 y.o. female who presents to the ED for mechanical fall with resultant right hip pain.  As above.  On exam, she has coarse wheezing throughout, with increased work  of breathing.  Right hip is tender to palpation.  No overlying lacerations or abrasions.  Distally neurovascularly intact.  Work-up notable for right-sided hip fracture and questionable sacral fracture. Discussed with orthopedics, MRI ordered for further evaluation of possible sacral fracture.  Also concern for superimposed pneumonia on COPD based on her chest x-ray and leukocytosis.  Receiving nebulizer treatment, antibiotics.  Will  require admission, pending COVID.   Final Clinical Impression(s) / ED Diagnosis  Final diagnoses:  Fall, initial encounter  Closed fracture of right hip, initial encounter [facility])     Note:  This document was prepared using Dragon voice recognition software and may include unintentional dictation errors.   Lilia Pro., MD 09/27/18 [phone]

## 2018-09-27 NOTE — Progress Notes (Signed)
Advanced Care Plan.  Purpose of Encounter: CODE STATUS. Parties in Attendance: The patient, her sister and me. Patient's Decisional Capacity: Yes. Medical Story: Ana Washington  is a 73 y.o. female with a known history of CAD, chronic respiratory failure on home oxygen 2 L, COPD, A. fib, hypertension, GERD, MI, PUD, etc. the patient is being admitted for right hip fracture, acute on chronic respiratory failure due to pneumonia.  I discussed with patient about her current condition, prognosis and CODE STATUS.  The patient does not want to be resuscitated or intubated if she has cardiopulmonary arrest. Plan:  Code Status: DNR. Time spent discussing advance care planning: 18 minutes.

## 2018-09-28 ENCOUNTER — Inpatient Hospital Stay: Payer: Medicare Other

## 2018-09-28 LAB — CBC WITH DIFFERENTIAL/PLATELET
Abs Immature Granulocytes: 0.23 10*3/uL — ABNORMAL HIGH (ref 0.00–0.07)
Basophils Absolute: 0 10*3/uL (ref 0.0–0.1)
Basophils Relative: 0 %
Eosinophils Absolute: 0 10*3/uL (ref 0.0–0.5)
Eosinophils Relative: 0 %
HCT: 34.9 % — ABNORMAL LOW (ref 36.0–46.0)
Hemoglobin: 11.5 g/dL — ABNORMAL LOW (ref 12.0–15.0)
Immature Granulocytes: 1 %
Lymphocytes Relative: 4 %
Lymphs Abs: 0.9 10*3/uL (ref 0.7–4.0)
MCH: 33.6 pg (ref 26.0–34.0)
MCHC: 33 g/dL (ref 30.0–36.0)
MCV: 102 fL — ABNORMAL HIGH (ref 80.0–100.0)
Monocytes Absolute: 1.4 10*3/uL — ABNORMAL HIGH (ref 0.1–1.0)
Monocytes Relative: 6 %
Neutro Abs: 20.5 10*3/uL — ABNORMAL HIGH (ref 1.7–7.7)
Neutrophils Relative %: 89 %
Platelets: 289 10*3/uL (ref 150–400)
RBC: 3.42 MIL/uL — ABNORMAL LOW (ref 3.87–5.11)
RDW: 12.2 % (ref 11.5–15.5)
WBC: 23.1 10*3/uL — ABNORMAL HIGH (ref 4.0–10.5)
nRBC: 0 % (ref 0.0–0.2)

## 2018-09-28 LAB — BASIC METABOLIC PANEL
Anion gap: 11 (ref 5–15)
BUN: 18 mg/dL (ref 8–23)
CO2: 33 mmol/L — ABNORMAL HIGH (ref 22–32)
Calcium: 8.7 mg/dL — ABNORMAL LOW (ref 8.9–10.3)
Chloride: 94 mmol/L — ABNORMAL LOW (ref 98–111)
Creatinine, Ser: 0.65 mg/dL (ref 0.44–1.00)
GFR calc Af Amer: >60 mL/min (ref >60)
GFR calc non Af Amer: >60 mL/min (ref >60)
Glucose, Bld: 163 mg/dL — ABNORMAL HIGH (ref 70–99)
Potassium: 3.8 mmol/L (ref 3.5–5.1)
Sodium: 138 mmol/L (ref 135–145)

## 2018-09-28 LAB — CBC
HCT: 35.1 % — ABNORMAL LOW (ref 36.0–46.0)
Hemoglobin: 11.8 g/dL — ABNORMAL LOW (ref 12.0–15.0)
MCH: 33.7 pg (ref 26.0–34.0)
MCHC: 33.6 g/dL (ref 30.0–36.0)
MCV: 100.3 fL — ABNORMAL HIGH (ref 80.0–100.0)
Platelets: 253 10*3/uL (ref 150–400)
RBC: 3.5 MIL/uL — ABNORMAL LOW (ref 3.87–5.11)
RDW: 11.9 % (ref 11.5–15.5)
WBC: 15.4 10*3/uL — ABNORMAL HIGH (ref 4.0–10.5)
nRBC: 0 % (ref 0.0–0.2)

## 2018-09-28 MED ORDER — DILTIAZEM HCL ER COATED BEADS 120 MG PO CP24
120.0000 mg | ORAL_CAPSULE | Freq: Every day | ORAL | Status: DC
  Administered 2018-09-29 – 2018-10-10 (×11): 120 mg via ORAL
  Filled 2018-09-28 (×13): qty 1

## 2018-09-28 MED ORDER — ENOXAPARIN SODIUM 40 MG/0.4ML ~~LOC~~ SOLN
40.0000 mg | SUBCUTANEOUS | Status: AC
  Administered 2018-09-28: 40 mg via SUBCUTANEOUS
  Filled 2018-09-28: qty 0.4

## 2018-09-28 MED ORDER — CIPROFLOXACIN HCL 500 MG PO TABS
500.0000 mg | ORAL_TABLET | Freq: Two times a day (BID) | ORAL | Status: DC
  Administered 2018-09-28 – 2018-10-07 (×18): 500 mg via ORAL
  Filled 2018-09-28 (×21): qty 1

## 2018-09-28 MED ORDER — ENSURE ENLIVE PO LIQD
237.0000 mL | Freq: Two times a day (BID) | ORAL | Status: DC
  Administered 2018-09-28 – 2018-10-06 (×8): 237 mL via ORAL

## 2018-09-28 MED ORDER — IPRATROPIUM-ALBUTEROL 0.5-2.5 (3) MG/3ML IN SOLN
3.0000 mL | Freq: Three times a day (TID) | RESPIRATORY_TRACT | Status: DC
  Administered 2018-09-29 (×2): 3 mL via RESPIRATORY_TRACT
  Filled 2018-09-28 (×2): qty 3

## 2018-09-28 MED ORDER — IOHEXOL 300 MG/ML  SOLN
75.0000 mL | Freq: Once | INTRAMUSCULAR | Status: AC | PRN
  Administered 2018-09-28: 75 mL via INTRAVENOUS

## 2018-09-28 MED ORDER — ADULT MULTIVITAMIN W/MINERALS CH
1.0000 | ORAL_TABLET | Freq: Every day | ORAL | Status: DC
  Administered 2018-09-29 – 2018-10-09 (×7): 1 via ORAL
  Filled 2018-09-28 (×10): qty 1

## 2018-09-28 MED ORDER — MORPHINE SULFATE (PF) 2 MG/ML IV SOLN
1.0000 mg | Freq: Once | INTRAVENOUS | Status: AC
  Administered 2018-09-28: 1 mg via INTRAVENOUS
  Filled 2018-09-28: qty 1

## 2018-09-28 MED ORDER — AZITHROMYCIN 250 MG PO TABS
250.0000 mg | ORAL_TABLET | Freq: Every day | ORAL | Status: DC
  Administered 2018-09-29 – 2018-10-11 (×13): 250 mg via ORAL
  Filled 2018-09-28 (×14): qty 1

## 2018-09-28 MED ORDER — MAGNESIUM SULFATE 2 GM/50ML IV SOLN
2.0000 g | Freq: Once | INTRAVENOUS | Status: AC
  Administered 2018-09-28: 18:00:00 2 g via INTRAVENOUS
  Filled 2018-09-28: qty 50

## 2018-09-28 NOTE — Progress Notes (Signed)
Notified Gardiner Barefoot, NP of SVT HR 168 @ 2319 and couplet PVC @ 0029. CCMD notified this Probation officer at [employer].

## 2018-09-28 NOTE — Progress Notes (Addendum)
[place] [facility] [place] NAME: Idellar Stocks    MR#:  [id number]  DATE OF BIRTH:  [birth year]  SUBJECTIVE:  CHIEF COMPLAINT:   Chief Complaint  Patient presents with  . Fall  . Shortness of Breath   Has chronic shortness of breath.  Uses scheduled nebulizers. In normal sinus rhythm.  REVIEW OF SYSTEMS:    Review of Systems  Constitutional: Positive for malaise/fatigue. Negative for chills and fever.  HENT: Negative for sore throat.   Eyes: Negative for blurred vision, double vision and pain.  Respiratory: Positive for shortness of breath. Negative for cough, hemoptysis and wheezing.   Cardiovascular: Negative for chest pain, palpitations, orthopnea and leg swelling.  Gastrointestinal: Negative for abdominal pain, constipation, diarrhea, heartburn, nausea and vomiting.  Genitourinary: Negative for dysuria and hematuria.  Musculoskeletal: Positive for joint pain. Negative for back pain.  Skin: Negative for rash.  Neurological: Negative for sensory change, speech change, focal weakness and headaches.  Endo/Heme/Allergies: Does not bruise/bleed easily.  Psychiatric/Behavioral: Negative for depression. The patient is not nervous/anxious.     DRUG ALLERGIES:   Allergies  Allergen Reactions  . Sulfa Antibiotics Diarrhea  . Codeine Nausea And Vomiting  . Penicillins Rash    Has patient had a PCN reaction causing immediate rash, facial/tongue/throat swelling, SOB or lightheadedness with hypotension: Unknown Has patient had a PCN reaction causing severe rash involving mucus membranes or skin necrosis: Unknown Has patient had a PCN reaction that required hospitalization: Unknown Has patient had a PCN reaction occurring within the last 10 years: No If all of the above answers are "NO", then may proceed with Cephalosporin use.    VITALS:  Blood pressure 124/78, pulse 82, temperature 97.9 F (36.6 C), temperature source Oral, resp. rate 16,  height 5\' 5"  (1.651 m), weight 47.6 kg, SpO2 92 %.  PHYSICAL EXAMINATION:   Physical Exam  GENERAL:  73 y.o.-year-old patient lying in the bed with no acute distress.  EYES: Pupils equal, round, reactive to light and accommodation. No scleral icterus. Extraocular muscles intact.  HEENT: Head atraumatic, normocephalic. Oropharynx and nasopharynx clear.  NECK:  Supple, no jugular venous distention. No thyroid enlargement, no tenderness.  LUNGS: Normal breath sounds bilaterally, no wheezing, rales, rhonchi. No use of accessory muscles of respiration.  CARDIOVASCULAR: S1, S2 normal. No murmurs, rubs, or gallops.  ABDOMEN: Soft, nontender, nondistended. Bowel sounds present. No organomegaly or mass.  EXTREMITIES: No cyanosis, clubbing or edema b/l.    NEUROLOGIC: Cranial nerves II through XII are intact. No focal Motor or sensory deficits b/l.   PSYCHIATRIC: The patient is alert and oriented x 3.  SKIN: No obvious rash, lesion, or ulcer.   LABORATORY PANEL:   CBC Recent Labs  Lab 09/28/18 0446  WBC 15.4*  HGB 11.8*  HCT 35.1*  PLT 253   ------------------------------------------------------------------------------------------------------------------ Chemistries  Recent Labs  Lab 09/27/18 1309 09/27/18 1508 09/28/18 0446  NA 141  --  138  K 3.0*  --  3.8  CL 93*  --  94*  CO2 35*  --  33*  GLUCOSE 118*  --  163*  BUN 14  --  18  CREATININE 0.58  --  0.65  CALCIUM 9.3  --  8.7*  MG  --  1.6*  --   AST 30  --   --   ALT 20  --   --   ALKPHOS 49  --   --   BILITOT 0.7  --   --    ------------------------------------------------------------------------------------------------------------------  Cardiac Enzymes No results for input(s): TROPONINI in the last 168 hours. ------------------------------------------------------------------------------------------------------------------  RADIOLOGY:  Mr Pelvis Wo Contrast  Result Date: 09/27/2018 CLINICAL DATA:  Right hip  pain after a fall. Abnormal radiographs suggesting right sacral fracture. EXAM: MRI PELVIS WITHOUT CONTRAST TECHNIQUE: Multiplanar multisequence MR imaging of the pelvis was performed. No intravenous contrast was administered. COMPARISON:  Radiographs dated 09/27/2018 FINDINGS: Musculoskeletal: There is an acute nondisplaced intertrochanteric fracture of the proximal right femur. There is an adjacent tear an intramuscular hematoma in the right gluteus maximus muscle. Is hemorrhage into the right greater trochanteric bursa and into the vastus lateralis muscle. There is also a small strain or tear of the proximal right gluteus maximus muscle. There is a subtle fracture of the third sacral segment extending into the sacral ala I to the right and left. There are subtle nondisplaced fractures of right and left pubic bodies. There are old healed fractures of the right inferior and superior pubic rami. Urinary Tract:  No abnormality visualized. Bowel:  Numerous diverticula in distal colon.  Otherwise negative. Vascular/Lymphatic: No pathologically enlarged lymph nodes. No significant vascular abnormality seen. Reproductive:  No mass or other significant abnormality Other:  None. IMPRESSION: 1. Acute nondisplaced intertrochanteric fracture of the proximal right femur. 2. Nondisplaced subtle fractures of the S3 segment of the sacrum and of both pubic bodies. 3. Intramuscular tear and small hematoma and strain of the right gluteus maximus muscle. Small hematoma extending into the vastus lateralis muscle. Electronically Signed   By: Lorriane Shire M.D.   On: 09/27/2018 16:45   Dg Chest Port 1 View  Result Date: 09/27/2018 CLINICAL DATA:  COPD, fall EXAM: PORTABLE CHEST 1 VIEW COMPARISON:  10/09/2017 FINDINGS: Cardiac silhouette appears within normal limits. Calcific aortic knob. Diffuse interstitial thickening throughout both lungs. Prominent right suprahilar opacity, which appears more prominent compared to prior. There  is hazy left basilar opacity, some of which may represent a component of hiatal hernia, better seen on prior exam. No pneumothorax is seen. The bones are diffusely demineralized. There are multiple thoracic spine compression deformity status post cement augmentation. No definite acute osseous abnormality within the limitations of this exam. IMPRESSION: 1. Prominent right suprahilar opacity, new from prior. Further evaluation with contrast-enhanced CT of the chest is recommended. 2. Hazy left basilar opacity. Electronically Signed   By: Davina Poke M.D.   On: 09/27/2018 13:59   Dg Hip Unilat With Pelvis 2-3 Views Right  Result Date: 09/27/2018 CLINICAL DATA:  73 year old female with a history of right hip pain after a fall EXAM: DG HIP (WITH OR WITHOUT PELVIS) 2-3V RIGHT COMPARISON:  None. FINDINGS: Osteopenia. Bony pelvic ring appears intact. Partially healed right inferior pubic ramus fracture. Disruption right-sided sacral foramen. Nondisplaced right intertrochanteric fracture is identified on the cross-table lateral view. Changes of prior vertebral augmentation. IMPRESSION: Nondisplaced right intertrochanteric fracture, best seen on the cross-table lateral view Questionable acute right sacral fracture with disruption of the sacral foramen. Further evaluation with pelvic MRI may be useful. Incomplete healing/remodeling of right inferior pubic ramus fracture. Osteopenia. Electronically Signed   By: Corrie Mckusick D.O.   On: 09/27/2018 14:04   ASSESSMENT AND PLAN:   * Right hip fracture Orthopedics has been consulted.  Pulmonary and cardiology consults placed on admission. Patient does have chronic shortness of breath and presently normal sinus rhythm with her A. fib.  Anticoagulation held. She will be high risk for surgery.  Will monitor for any complications.  No further work-up needed but  will wait for pulmonary and cardiology input.  Surgery tentatively scheduled for Sunday.  *Acute COPD  exacerbation, mild.  Patient does have baseline shortness of breath and scheduled nebulizers at home.  Continue.  Discussed with Dr. Lanney Gins  of pulmonary.  * Pneumonia.  CAP. Start Zithromax and Rocephin, Robitussin as needed.  * Right hilar pulmonary mass/scarring CT scan of the chest with contrast pending  * Hypokalemia.    Replaced  *Chronic leukocytosis.  *Paroxysmal atrial fibrillation on anticoagulation In normal sinus rhythm.  Hold anticoagulation for surgery.  * Hypertension.  Continue Lopressor and Cardizem, hold Norvasc and losartan due to normal blood pressure.  * COPD.  Stable.  All the records are reviewed and case discussed with Care Management/Social Worker Management plans discussed with the patient, family and they are in agreement.  CODE STATUS: DNR/DNI  TOTAL TIME TAKING CARE OF THIS PATIENT: 35 minutes.   POSSIBLE D/C IN 3-4 DAYS, DEPENDING ON CLINICAL CONDITION.  Leia Alf Maynor Mwangi M.D on 09/28/2018 at 1:35 PM  Between 7am to 6pm - Pager - [phone]  After 6pm go to [email or web address] - password [email or web address] [facility]  Office  [phone]  CC: Primary care physician; [name], MD  Note: This dictation was prepared with Dragon dictation along with smaller phrase technology. Any transcriptional errors that result from this process are unintentional.

## 2018-09-28 NOTE — TOC Initial Note (Signed)
Transition of Care [facility]) - Initial/Assessment Note    Patient Details  Name: Ana Washington MRN: [id number] Date of Birth: 1945-09-24  Transition of Care [facility]) CM/SW Contact:    Ordell Prichett, Lenice Llamas Phone Number: [phone]  09/28/2018, 4:04 PM  Clinical Narrative: Clinical Social Worker ([place]) reviewed chart and noted that patient has a hip fracture and will likely have surgery Sunday 8/30. CSW met with patient alone at bedside today to discuss D/C plan. Patient was alert and oriented X4 and was laying in the bed. CSW introduced self and explained role of CSW department. Patient reported that she lives in [place] with her disabled husband Fritz Pickerel. Patient reported that she has [facility] at home and they provide her oxygen. Patient reported that [facility] services were stopped once she came into [facility]. CSW explained that after surgery PT will evaluate her and make a recommendation of home health or SNF. Patient reported that she is not going to a SNF and will go home with home health. Patient understands that she can't have hospice and home health at the same time under her insurance. Per Tim with [facility] patient was discharged from their services today. CSW will continue to follow and assist as needed.                   Expected Discharge Plan: [place] Barriers to Discharge: Continued Medical Work up   Patient Goals and CMS Choice Patient states their goals for this hospitalization and ongoing recovery are:: To go home.      Expected Discharge Plan and Services Expected Discharge Plan: [place] In-house Referral: Clinical Social Work Discharge Planning Services: CM Consult   Living arrangements for the past 2 months: [place] Expected Discharge Date: 09/30/18                                    Prior Living Arrangements/Services Living arrangements for the past 2 months: Single Family Home Lives with::  Spouse Patient language and need for interpreter reviewed:: No Do you feel safe going back to the place where you live?: Yes      Need for Family Participation in Patient Care: No (Comment) Care giver support system in place?: Yes (comment) Current home services: DME(Patient has home oxygen.) Criminal Activity/Legal Involvement Pertinent to Current Situation/Hospitalization: No - Comment as needed  Activities of Daily Living Home Assistive Devices/Equipment: Walker (specify type), Other (Comment)(lift chair) ADL Screening (condition at time of admission) Patient's cognitive ability adequate to safely complete daily activities?: Yes Is the patient deaf or have difficulty hearing?: No Does the patient have difficulty seeing, even when wearing glasses/contacts?: No Does the patient have difficulty concentrating, remembering, or making decisions?: No Patient able to express need for assistance with ADLs?: Yes Does the patient have difficulty dressing or bathing?: No Independently performs ADLs?: Yes (appropriate for developmental age) Does the patient have difficulty walking or climbing stairs?: Yes Weakness of Legs: Right Weakness of Arms/Hands: None  Permission Sought/Granted Permission sought to share information with : Other (comment)(Hospice Agency.) Permission granted to share information with : Yes, Verbal Permission Granted              Emotional Assessment Appearance:: Appears stated age Attitude/Demeanor/Rapport: Engaged Affect (typically observed): Pleasant, Calm Orientation: : Oriented to Self, Oriented to Place, Oriented to  Time, Oriented to Situation Alcohol /  Substance Use: Not Applicable Psych Involvement: No (comment)  Admission diagnosis:  Fall, initial encounter [W19.XXXA] Closed fracture of right hip, initial encounter [facility]) [S72.001A] Patient Active Problem List   Diagnosis Date Noted  . Closed right hip fracture ([place]) 09/27/2018  . Intractable nausea and  vomiting 04/08/2018  . Atrial fibrillation with RVR ([place]) 04/08/2018  . Thoracic radiculitis (Bilateral) 03/29/2018  . Vasovagal episode 03/29/2018  . Closed compression fracture of T10 thoracic vertebra, sequela 03/29/2018  . Abnormal MRI, lumbar spine (12/15/2016) 03/21/2018  . Lumbar compression fractures, sequela (L1, L2, L3, L4, and L5) 03/21/2018  . Closed compression fracture of L1 lumbar vertebra, sequela 03/21/2018  . Closed compression fracture of L2 lumbar vertebra, sequela 03/21/2018  . Closed compression fracture of L3 lumbar vertebra, sequela 03/21/2018  . Closed compression fracture of L4 lumbar vertebra, sequela 03/21/2018  . Closed compression fracture of L5 lumbar vertebra, sequela 03/21/2018  . Thoracic compression fracture, sequela (T5, T9, T10, T11, and T12) 03/21/2018  . Closed compression fracture of T5 thoracic vertebra, sequela 03/21/2018  . Closed compression fracture of T9 thoracic vertebra, sequela 03/21/2018  . Close compression fracture of T11 thoracic vertebra, sequela 03/21/2018  . Closed compression fracture of T12 thoracic vertebra, sequela 03/21/2018  . Lumbar facet hypertrophy 03/21/2018  . Grade 1  Lumbar Anterolisthesis of L3/4 and L4/5 03/21/2018  . Lumbar central spinal stenosis (Multilevel), 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 claudication 03/21/2018  . Chronic anticoagulation (ELIQUIS) 03/21/2018  . History of pelvic fracture 03/21/2018  . Chronic musculoskeletal pain 03/21/2018  . Neurogenic pain 03/21/2018  . Long term prescription benzodiazepine use 03/21/2018  . 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 Adult bronchiectasis ([place]) 03/01/2018  . Diverticulitis 03/01/2018  . 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[place]) 03/01/2018  . Migraines 03/01/2018  . 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 Psoriasis 03/01/2018  . Chronic upper back pain (Primary Area of Pain) (Bilateral) (R>L) 03/01/2018  .  Chronic low back pain (Secondary Area of Pain) (Bilateral) (R>L) w/o sciatica 03/01/2018  . Chronic pain syndrome 03/01/2018  . Long term current use of opiate analgesic 03/01/2018  . Pharmacologic therapy 03/01/2018  . Disorder of skeletal system 03/01/2018  . Problems influencing health status 03/01/2018  . History of kyphoplasty (L1, L2, L3, T9, T11, and T12) 01/05/2018  . Protein-calorie malnutrition, severe 08/03/2017  . GERD (gastroesophageal reflux disease) 07/30/2017  . Pelvic fracture ([place]) 07/30/2017  . Paroxysmal atrial fibrillation ([place]) 07/30/2017  . Other dysphagia 09/13/2016  . Unintended weight loss 09/13/2016  . Essential hypertension 10/03/2013  . Osteoarthritis of knees (Bilateral) 10/03/2013  . Primary localized osteoarthrosis, lower leg 10/03/2013  . Non-ischemic cardiomyopathy ([place]) 09/12/2013  . Coronary artery disease involving native coronary artery of native heart without angina pectoris 07/28/2012  . Hyperlipidemia, mixed 07/28/2012  . DDD (degenerative disc disease), lumbar 02/29/2012   PCP:  [name], MD Pharmacy:   [facility] [place], [place] - [place] [address] [place] [address] Phone: [phone] Fax: [phone]     Social Determinants of Health ([place]) Interventions    Readmission Risk Interventions Readmission Risk Prevention Plan 01/06/2018  Transportation Screening Complete  PCP or Specialist Appt within 5-7 Days Complete  Home Care Screening Complete  Medication Review (RN CM) Complete  Some recent data might be hidden

## 2018-09-28 NOTE — NC FL2 (Signed)
[place] LEVEL OF CARE SCREENING TOOL     IDENTIFICATION  Patient Name: Ana Washington Birthdate: 1945/03/21 Sex: female Admission Date (Current Location): 09/27/2018  [place] and [place] Number:  Engineering geologist and Address:  [facility], [address] [place], [place] [address]      Provider Number: [id number]  Attending Physician Name and Address:  Hillary Bow, MD  Relative Name and Phone Number:       Current Level of Care: Hospital Recommended Level of Care: [place] Prior Approval Number:    Date Approved/Denied:   PASRR Number: [id number]  Discharge Plan: SNF    Current Diagnoses: Patient Active Problem List   Diagnosis Date Noted  . Closed right hip fracture ([place]) 09/27/2018  . Intractable nausea and vomiting 04/08/2018  . Atrial fibrillation with RVR ([place]) 04/08/2018  . Thoracic radiculitis (Bilateral) 03/29/2018  . Vasovagal episode 03/29/2018  . Closed compression fracture of T10 thoracic vertebra, sequela 03/29/2018  . Abnormal MRI, lumbar spine (12/15/2016) 03/21/2018  . Lumbar compression fractures, sequela (L1, L2, L3, L4, and L5) 03/21/2018  . Closed compression fracture of L1 lumbar vertebra, sequela 03/21/2018  . Closed compression fracture of L2 lumbar vertebra, sequela 03/21/2018  . Closed compression fracture of L3 lumbar vertebra, sequela 03/21/2018  . Closed compression fracture of L4 lumbar vertebra, sequela 03/21/2018  . Closed compression fracture of L5 lumbar vertebra, sequela 03/21/2018  . Thoracic compression fracture, sequela (T5, T9, T10, T11, and T12) 03/21/2018  . Closed compression fracture of T5 thoracic vertebra, sequela 03/21/2018  . Closed compression fracture of T9 thoracic vertebra, sequela 03/21/2018  . Close compression fracture of T11 thoracic vertebra, sequela 03/21/2018  . Closed compression fracture of T12 thoracic vertebra, sequela 03/21/2018  . Lumbar  facet hypertrophy 03/21/2018  . Grade 1  Lumbar Anterolisthesis of L3/4 and L4/5 03/21/2018  . Lumbar central spinal stenosis (Multilevel), 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 claudication 03/21/2018  . Chronic anticoagulation (ELIQUIS) 03/21/2018  . History of pelvic fracture 03/21/2018  . Chronic musculoskeletal pain 03/21/2018  . Neurogenic pain 03/21/2018  . Long term prescription benzodiazepine use 03/21/2018  . 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 Adult bronchiectasis ([place]) 03/01/2018  . Diverticulitis 03/01/2018  . 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[place]) 03/01/2018  . Migraines 03/01/2018  . 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 Psoriasis 03/01/2018  . Chronic upper back pain (Primary Area of Pain) (Bilateral) (R>L) 03/01/2018  . Chronic low back pain (Secondary Area of Pain) (Bilateral) (R>L) w/o sciatica 03/01/2018  . Chronic pain syndrome 03/01/2018  . Long term current use of opiate analgesic 03/01/2018  . Pharmacologic therapy 03/01/2018  . Disorder of skeletal system 03/01/2018  . Problems influencing health status 03/01/2018  . History of kyphoplasty (L1, L2, L3, T9, T11, and T12) 01/05/2018  . Protein-calorie malnutrition, severe 08/03/2017  . GERD (gastroesophageal reflux disease) 07/30/2017  . Pelvic fracture ([place]) 07/30/2017  . Paroxysmal atrial fibrillation ([place]) 07/30/2017  . Other dysphagia 09/13/2016  . Unintended weight loss 09/13/2016  . Essential hypertension 10/03/2013  . Osteoarthritis of knees (Bilateral) 10/03/2013  . Primary localized osteoarthrosis, lower leg 10/03/2013  . Non-ischemic cardiomyopathy ([place]) 09/12/2013  . Coronary artery disease involving native coronary artery of native heart without angina pectoris 07/28/2012  . Hyperlipidemia, mixed 07/28/2012  . DDD (degenerative disc disease), lumbar 02/29/2012    Orientation RESPIRATION BLADDER Height & Weight     Self, Time, Situation,  Place  O2(2 Liters Oxygen.)  Incontinent Weight: 105 lb (47.6 kg) Height:  5\' 5"  (165.1 cm)  BEHAVIORAL SYMPTOMS/MOOD NEUROLOGICAL BOWEL NUTRITION STATUS      Continent Diet(Diet: 2 Grams Sodium.)  AMBULATORY STATUS COMMUNICATION OF NEEDS Skin   Extensive Assist Verbally Surgical wounds                       Personal Care Assistance Level of Assistance  Bathing, Dressing, Feeding Bathing Assistance: Limited assistance Feeding assistance: Independent Dressing Assistance: Limited assistance     Functional Limitations Info  Sight, Hearing, Speech Sight Info: Adequate Hearing Info: Adequate Speech Info: Adequate    SPECIAL CARE FACTORS FREQUENCY  PT (By licensed PT), OT (By licensed OT)     PT Frequency: 5 OT Frequency: 5            Contractures      Additional Factors Info  Code Status, Allergies Code Status Info: DNR Allergies Info: Sulfa Antibiotics, Codeine, Penicillins           Current Medications (09/28/2018):  This is the current hospital active medication list Current Facility-Administered Medications  Medication Dose Route Frequency Provider Last Rate Last Dose  . acetaminophen (TYLENOL) tablet 650 mg  650 mg Oral Q6H PRN Demetrios Loll, MD   650 mg at 09/28/18 0746   Or  . acetaminophen (TYLENOL) suppository 650 mg  650 mg Rectal Q6H PRN Demetrios Loll, MD      . acidophilus (RISAQUAD) capsule 1 capsule  1 capsule Oral Daily Demetrios Loll, MD   1 capsule at 09/27/18 2144  . albuterol (PROVENTIL) (2.5 MG/3ML) 0.083% nebulizer solution 2.5 mg  2.5 mg Nebulization Q2H PRN Demetrios Loll, MD      . azithromycin [facility]) tablet 250 mg  250 mg Oral Daily Ottie Glazier, MD      . bisacodyl (DULCOLAX) EC tablet 5 mg  5 mg Oral Daily PRN Demetrios Loll, MD      . calcium-vitamin D (OSCAL WITH D) 500-200 MG-UNIT per tablet 1 tablet  1 tablet Oral Daily Demetrios Loll, MD      . cefTRIAXone (ROCEPHIN) 1 g in sodium chloride 0.9 % 100 mL IVPB  1 g Intravenous Q24H Nazari, Walid A,  RPH 200 mL/hr at 09/28/18 1444 1 g at 09/28/18 1444  . chlorpheniramine-HYDROcodone (TUSSIONEX) 10-8 MG/5ML suspension 5 mL  5 mL Oral QHS Demetrios Loll, MD   5 mL at 09/27/18 2328  . ciprofloxacin (CIPRO) tablet 500 mg  500 mg Oral BID Ottie Glazier, MD      . diltiazem (CARDIZEM) tablet 60 mg  60 mg Oral Q12H Demetrios Loll, MD   60 mg at 09/28/18 1045  . feeding supplement (ENSURE ENLIVE) (ENSURE ENLIVE) liquid 237 mL  237 mL Oral BID BM Sudini, Srikar, MD      . ferrous sulfate tablet 325 mg  325 mg Oral Daily Demetrios Loll, MD   325 mg at 09/28/18 1046  . gabapentin (NEURONTIN) capsule 300 mg  300 mg Oral QID Demetrios Loll, MD   300 mg at 09/28/18 1444  . guaiFENesin-dextromethorphan (ROBITUSSIN DM) 100-10 MG/5ML syrup 5 mL  5 mL Oral Q4H PRN Demetrios Loll, MD      . ipratropium-albuterol (DUONEB) 0.5-2.5 (3) MG/3ML nebulizer solution 3 mL  3 mL Nebulization Q6H Demetrios Loll, MD   3 mL at 09/28/18 1300  . LORazepam (ATIVAN) tablet 0.5 mg  0.5 mg Oral Q4H Demetrios Loll, MD   0.5 mg at 09/28/18 1444  . metoprolol succinate (  TOPROL-XL) 24 hr tablet 50 mg  50 mg Oral QHS Demetrios Loll, MD      . morphine 4 MG/ML injection 4 mg  4 mg Intravenous Q4H PRN Demetrios Loll, MD      . Derrill Memo ON 09/29/2018] multivitamin with minerals tablet 1 tablet  1 tablet Oral Daily Sudini, Srikar, MD      . ondansetron [facility] tablet 4 mg  4 mg Oral Q6H PRN Demetrios Loll, MD       Or  . ondansetron [facility]) injection 4 mg  4 mg Intravenous Q6H PRN Demetrios Loll, MD      . pantoprazole (PROTONIX) EC tablet 40 mg  40 mg Oral Daily Demetrios Loll, MD   40 mg at 09/28/18 1045  . senna-docusate (Senokot-S) tablet 1 tablet  1 tablet Oral QHS PRN Demetrios Loll, MD      . sucralfate (CARAFATE) tablet 1 g  1 g Oral QID Demetrios Loll, MD   1 g at 09/27/18 2144  . traMADol (ULTRAM) tablet 50 mg  50 mg Oral Q6H PRN Demetrios Loll, MD   50 mg at 09/28/18 1047  . vitamin C (ASCORBIC ACID) tablet 1,000 mg  1,000 mg Oral Daily Demetrios Loll, MD   1,000 mg at 09/28/18 1046      Discharge Medications: Please see discharge summary for a list of discharge medications.  Relevant Imaging Results:  Relevant Lab Results:   Additional Information SSN: [id number]  Nanea Jared, Veronia Beets, LCSW

## 2018-09-28 NOTE — Consult Note (Signed)
Pulmonary Medicine          Date: 09/28/2018,   MRN# [id number] Ana Washington October 05, 1945     AdmissionWeight: 47.6 kg                 CurrentWeight: 47.6 kg      CHIEF COMPLAINT:   Acute on chronic respiratory failure   HISTORY OF PRESENT ILLNESS   This is a pleasant 73 yo F with hx of CAD, chronic respiratory failure on 2L. AF, HTN, AF, GERD, MID, PUD.  She his s/p mechanical fall with subsequent admission to ED for right sided hip pain. In ED she was found to have right hip fracture on XR.  She had CXR which also showed RUL infiltrate. Pulmonary consultation was placed by Dr Bridgett Larsson for acute on chronic hypoxemic respiratory failure in the context of COPD and s/p fall with hip fracture.  Patient has leukocytosis with left shift.  She has never smoked.  She has hx of pulmonary fibrosis and bronchiectasis. 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 She has not been able to do much in terms of activity.     PAST MEDICAL HISTORY   Past Medical History:  Diagnosis Date   Arthritis    Atrial fibrillation ([place])    Bronchiectasis ([place])    CAD (coronary artery disease)    CAD (coronary artery disease) 07/30/2017   Dumping syndrome    Dyspnea    Dysrhythmia    afib   Essential hypertension, malignant 10/03/2013   Family history of adverse reaction to anesthesia    sister [name]   GERD (gastroesophageal reflux disease)    Headache    MIGRAINES   Hypertension    Lung disease    Myocardial infarction ([place]) 2007   Non-STEMI   [name] (postoperative nausea and vomiting)    Psoriasis    PUD (peptic ulcer disease)      SURGICAL HISTORY   Past Surgical History:  Procedure Laterality Date   BACK SURGERY     CHOLECYSTECTOMY     EYE SURGERY     FOOT SURGERY     KYPHOPLASTY N/A 07/05/2016   Procedure: KYPHOPLASTY T - 9;   Surgeon: Hessie Knows, MD;  Location: [facility];  Service: Orthopedics;  Laterality: N/A;   KYPHOPLASTY N/A 11/29/2017   Procedure: Iona Hansen;  Surgeon: Hessie Knows, MD;  Location: [facility];  Service: Orthopedics;  Laterality: N/A;  L2 and L3   KYPHOPLASTY N/A 12/18/2017   Procedure: KYPHOPLASTY L1;  Surgeon: Hessie Knows, MD;  Location: [facility];  Service: Orthopedics;  Laterality: N/A;   KYPHOPLASTY N/A 01/05/2018   Procedure: KYPHOPLASTY-T11,T12;  Surgeon: Hessie Knows, MD;  Location: [facility];  Service: Orthopedics;  Laterality: N/A;   KYPHOPLASTY N/A 04/05/2018   Procedure: T10 KYPHOPLASTY;  Surgeon: Hessie Knows, MD;  Location: [facility];  Service: Orthopedics;  Laterality: N/A;   KYPHOPLASTY N/A 04/12/2018   Procedure: KYPHOPLASTY T7,8;  Surgeon: Hessie Knows, MD;  Location: [facility];  Service: Orthopedics;  Laterality: N/A;   KYPHOPLASTY N/A 04/19/2018   Procedure: KYPHOPLASTY T5, T6;  Surgeon: Hessie Knows, MD;  Location: [facility];  Service: Orthopedics;  Laterality: N/A;   LUNG SURGERY  1990 and 1996   THOROCOTOMY WITH LOBECTOMY     LEFT LOWER THORACOTOMY / RIGHT MIDDLE LOBECTOMY  FAMILY HISTORY   Family History  Problem Relation Age of Onset   Hypertension Mother    Hypertension Father      SOCIAL HISTORY   Social History   Tobacco Use   Smoking status: Never Smoker   Smokeless tobacco: Never Used  Substance Use Topics   Alcohol use: No   Drug use: No     MEDICATIONS    Home Medication:    Current Medication:  Current Facility-Administered Medications:    acetaminophen (TYLENOL) tablet 650 mg, 650 mg, Oral, Q6H PRN, 650 mg at 09/28/18 0746 **OR** acetaminophen (TYLENOL) suppository 650 mg, 650 mg, Rectal, Q6H PRN, Demetrios Loll, MD   acidophilus (RISAQUAD) capsule 1 capsule, 1 capsule, Oral, Daily, Demetrios Loll, MD, 1 capsule at 09/27/18 2144   albuterol (PROVENTIL) (2.5 MG/3ML) 0.083% nebulizer solution 2.5 mg, 2.5 mg,  Nebulization, Q2H PRN, Demetrios Loll, MD   azithromycin (ZITHROMAX) 500 mg in sodium chloride 0.9 % 250 mL IVPB, 500 mg, Intravenous, Q24H, Demetrios Loll, MD   bisacodyl (DULCOLAX) EC tablet 5 mg, 5 mg, Oral, Daily PRN, Demetrios Loll, MD   calcium-vitamin D (OSCAL WITH D) 500-200 MG-UNIT per tablet 1 tablet, 1 tablet, Oral, Daily, Demetrios Loll, MD   cefTRIAXone (ROCEPHIN) 1 g in sodium chloride 0.9 % 100 mL IVPB, 1 g, Intravenous, Q24H, Nazari, Walid A, RPH   chlorpheniramine-HYDROcodone (TUSSIONEX) 10-8 MG/5ML suspension 5 mL, 5 mL, Oral, QHS, Demetrios Loll, MD, 5 mL at 09/27/18 2328   diltiazem (CARDIZEM) tablet 60 mg, 60 mg, Oral, Q12H, Demetrios Loll, MD, 60 mg at 09/28/18 1045   ferrous sulfate tablet 325 mg, 325 mg, Oral, Daily, Demetrios Loll, MD, 325 mg at 09/28/18 1046   gabapentin (NEURONTIN) capsule 300 mg, 300 mg, Oral, QID, Demetrios Loll, MD, 300 mg at 09/28/18 1045   guaiFENesin-dextromethorphan (ROBITUSSIN DM) 100-10 MG/5ML syrup 5 mL, 5 mL, Oral, Q4H PRN, Demetrios Loll, MD   ipratropium-albuterol (DUONEB) 0.5-2.5 (3) MG/3ML nebulizer solution 3 mL, 3 mL, Nebulization, Q6H, Demetrios Loll, MD, 3 mL at 09/28/18 0754   LORazepam (ATIVAN) tablet 0.5 mg, 0.5 mg, Oral, Q4H, Demetrios Loll, MD, 0.5 mg at 09/28/18 1051   metoprolol succinate (TOPROL-XL) 24 hr tablet 50 mg, 50 mg, Oral, QHS, Demetrios Loll, MD   morphine 4 MG/ML injection 4 mg, 4 mg, Intravenous, Q4H PRN, Demetrios Loll, MD   ondansetron [facility]) tablet 4 mg, 4 mg, Oral, Q6H PRN **OR** ondansetron (ZOFRAN) injection 4 mg, 4 mg, Intravenous, Q6H PRN, Demetrios Loll, MD   pantoprazole (PROTONIX) EC tablet 40 mg, 40 mg, Oral, Daily, Demetrios Loll, MD, 40 mg at 09/28/18 1045   senna-docusate (Senokot-S) tablet 1 tablet, 1 tablet, Oral, QHS PRN, Demetrios Loll, MD   sucralfate (CARAFATE) tablet 1 g, 1 g, Oral, QID, Demetrios Loll, MD, 1 g at 09/27/18 2144   traMADol (ULTRAM) tablet 50 mg, 50 mg, Oral, Q6H PRN, Demetrios Loll, MD, 50 mg at 09/28/18 1047   vitamin C  (ASCORBIC ACID) tablet 1,000 mg, 1,000 mg, Oral, Daily, Demetrios Loll, MD, 1,000 mg at 09/28/18 1046    ALLERGIES   Sulfa antibiotics, Codeine, and Penicillins     REVIEW OF SYSTEMS    Review of Systems:  Gen:  Denies  fever, sweats, chills weigh loss  HEENT: Denies blurred vision, double vision, ear pain, eye pain, hearing loss, nose bleeds, sore throat Cardiac:  No dizziness, chest pain or heaviness, chest tightness,edema Resp:   Denies cough or sputum porduction, shortness of breath,wheezing, hemoptysis,  Gi: Denies  swallowing difficulty, stomach pain, nausea or vomiting, diarrhea, constipation, bowel incontinence Gu:  Denies bladder incontinence, burning urine Ext:   Denies Joint pain, stiffness or swelling Skin: Denies  skin rash, easy bruising or bleeding or hives Endoc:  Denies polyuria, polydipsia , polyphagia or weight change Psych:   Denies depression, insomnia or hallucinations   Other:  All other systems negative   VS: BP 124/78 (BP Location: Right Arm)    Pulse 82    Temp 97.9 F (36.6 C) (Oral)    Resp 16    Ht _0  (1.651 m)    Wt 47.6 kg    SpO2 95%    BMI 17.47 kg/m      PHYSICAL EXAM    GENERAL:NAD, no fevers, chills, no weakness no fatigue HEAD: Normocephalic, atraumatic.  EYES: Pupils equal, round, reactive to light. Extraocular muscles intact. No scleral icterus.  MOUTH: Moist mucosal membrane. Dentition intact. No abscess noted.  EAR, NOSE, THROAT: Clear without exudates. No external lesions.  NECK: Supple. No thyromegaly. No nodules. No JVD.  PULMONARY: Diffuse coarse rhonchi right sided +wheezes CARDIOVASCULAR: S1 and S2. Regular rate and rhythm. No murmurs, rubs, or gallops. No edema. Pedal pulses 2+ bilaterally.  GASTROINTESTINAL: Soft, nontender, nondistended. No masses. Positive bowel sounds. No hepatosplenomegaly.  MUSCULOSKELETAL: No swelling, clubbing, or edema. Range of motion full in all extremities.  NEUROLOGIC: Cranial nerves II  through XII are intact. No gross focal neurological deficits. Sensation intact. Reflexes intact.  SKIN: No ulceration, lesions, rashes, or cyanosis. Skin warm and dry. Turgor intact.  PSYCHIATRIC: Mood, affect within normal limits. The patient is awake, alert and oriented x 3. Insight, judgment intact.       IMAGING    Mr Pelvis Wo Contrast  Result Date: 09/27/2018 CLINICAL DATA:  Right hip pain after a fall. Abnormal radiographs suggesting right sacral fracture. EXAM: MRI PELVIS WITHOUT CONTRAST TECHNIQUE: Multiplanar multisequence MR imaging of the pelvis was performed. No intravenous contrast was administered. COMPARISON:  Radiographs dated 09/27/2018 FINDINGS: Musculoskeletal: There is an acute nondisplaced intertrochanteric fracture of the proximal right femur. There is an adjacent tear an intramuscular hematoma in the right gluteus maximus muscle. Is hemorrhage into the right greater trochanteric bursa and into the vastus lateralis muscle. There is also a small strain or tear of the proximal right gluteus maximus muscle. There is a subtle fracture of the third sacral segment extending into the sacral ala I to the right and left. There are subtle nondisplaced fractures of right and left pubic bodies. There are old healed fractures of the right inferior and superior pubic rami. Urinary Tract:  No abnormality visualized. Bowel:  Numerous diverticula in distal colon.  Otherwise negative. Vascular/Lymphatic: No pathologically enlarged lymph nodes. No significant vascular abnormality seen. Reproductive:  No mass or other significant abnormality Other:  None. IMPRESSION: 1. Acute nondisplaced intertrochanteric fracture of the proximal right femur. 2. Nondisplaced subtle fractures of the S3 segment of the sacrum and of both pubic bodies. 3. Intramuscular tear and small hematoma and strain of the right gluteus maximus muscle. Small hematoma extending into the vastus lateralis muscle. Electronically Signed    By: Lorriane Shire M.D.   On: 09/27/2018 16:45   Dg Chest Port 1 View  Result Date: 09/27/2018 CLINICAL DATA:  COPD, fall EXAM: PORTABLE CHEST 1 VIEW COMPARISON:  10/09/2017 FINDINGS: Cardiac silhouette appears within normal limits. Calcific aortic knob. Diffuse interstitial thickening throughout both lungs. Prominent right suprahilar opacity, which appears more prominent compared to prior. There  is hazy left basilar opacity, some of which may represent a component of hiatal hernia, better seen on prior exam. No pneumothorax is seen. The bones are diffusely demineralized. There are multiple thoracic spine compression deformity status post cement augmentation. No definite acute osseous abnormality within the limitations of this exam. IMPRESSION: 1. Prominent right suprahilar opacity, new from prior. Further evaluation with contrast-enhanced CT of the chest is recommended. 2. Hazy left basilar opacity. Electronically Signed   By: Davina Poke M.D.   On: 09/27/2018 13:59   Dg Hip Unilat With Pelvis 2-3 Views Right  Result Date: 09/27/2018 CLINICAL DATA:  73 year old female with a history of right hip pain after a fall EXAM: DG HIP (WITH OR WITHOUT PELVIS) 2-3V RIGHT COMPARISON:  None. FINDINGS: Osteopenia. Bony pelvic ring appears intact. Partially healed right inferior pubic ramus fracture. Disruption right-sided sacral foramen. Nondisplaced right intertrochanteric fracture is identified on the cross-table lateral view. Changes of prior vertebral augmentation. IMPRESSION: Nondisplaced right intertrochanteric fracture, best seen on the cross-table lateral view Questionable acute right sacral fracture with disruption of the sacral foramen. Further evaluation with pelvic MRI may be useful. Incomplete healing/remodeling of right inferior pubic ramus fracture. Osteopenia. Electronically Signed   By: Corrie Mckusick D.O.   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      - current right hilar lesion may be shifted vascular structures vs new pulmonary malignancy. Will repeat CT chest      -patient dropped from 140lbs to 105.  She states in past year she actually gained weight appx 5lbs     - would recommend to continue chest physiotherapy with MetaNeb to recruit atelectatic segments.       -reviewed CT chest today - no new lesions, small denisty in lateral RML area likely due to post surgical changes.          Pulmonary preoperative clearance    - intermediate risk of post-operative pulmonary complications- 100.1%risk      Thank you for allowing me to participate in the care of this patient.   Patient/Family are satisfied with care plan and all questions have been answered.  This document was prepared using Dragon voice recognition software and may include unintentional dictation errors.     FOttie Glazier M.D.  [facility]

## 2018-09-28 NOTE — Progress Notes (Signed)
ORTHOPAEDICS PROGRESS NOTE  PATIENT NAME: Ana Washington DOB: [birth year]  MRN: [id number]  Subjective: The patient states she is doing much better this morning.  Breathing is not labored and she is now on nasal cannula. Patient states that the hip pain is reasonably well controlled.  Objective: Vital signs in last 24 hours: Temp:  [97.7 F (36.5 C)-98 F (36.7 C)] 98 F (36.7 C) (08/28 0021) Pulse Rate:  [82-96] 82 (08/28 0021) Resp:  [18-36] 20 (08/28 0021) BP: (120-158)/(76-97) 133/85 (08/28 0021) SpO2:  [91 %-100 %] 96 % (08/28 0250) Weight:  [47.6 kg] 47.6 kg (08/27 1301)  Intake/Output from previous day: 08/27 0701 - 08/28 0700 In: -  Out: 200 [Urine:200]  Recent Labs    09/27/18 1309 09/28/18 0446  WBC 18.7* 15.4*  HGB 12.9 11.8*  HCT 38.3 35.1*  PLT 318 253  K 3.0* 3.8  CL 93* 94*  CO2 35* 33*  BUN 14 18  CREATININE 0.58 0.65  GLUCOSE 118* 163*  CALCIUM 9.3 8.7*    EXAM General: Well-developed well-nourished female seen in no apparent discomfort. Lungs: Mild bilateral wheezing.  No gross rhonchi. Cardiac: Irregular rate and rhythm. Right lower extremity: Slight rotation.  No gross pain at rest.  Minimal ecchymosis to the hip. Neurologic: Awake, alert, and oriented.  Sensory and motor function are grossly intact.  Assessment: Right intertrochanteric femur fracture  Plan: Cardiology and Pulmonology consults are pending. Anticipate surgery on Sunday morning pending clearances.  James P. Holley Bouche M.D.

## 2018-09-29 LAB — BASIC METABOLIC PANEL
Anion gap: 7 (ref 5–15)
BUN: 21 mg/dL (ref 8–23)
CO2: 35 mmol/L — ABNORMAL HIGH (ref 22–32)
Calcium: 8.8 mg/dL — ABNORMAL LOW (ref 8.9–10.3)
Chloride: 94 mmol/L — ABNORMAL LOW (ref 98–111)
Creatinine, Ser: 0.67 mg/dL (ref 0.44–1.00)
GFR calc Af Amer: >60 mL/min (ref >60)
GFR calc non Af Amer: >60 mL/min (ref >60)
Glucose, Bld: 94 mg/dL (ref 70–99)
Potassium: 4.6 mmol/L (ref 3.5–5.1)
Sodium: 136 mmol/L (ref 135–145)

## 2018-09-29 MED ORDER — HYDROCODONE-ACETAMINOPHEN 7.5-325 MG PO TABS
1.0000 | ORAL_TABLET | ORAL | Status: DC | PRN
  Administered 2018-09-29: 1 via ORAL
  Filled 2018-09-29: qty 1

## 2018-09-29 MED ORDER — ACETYLCYSTEINE 20 % IN SOLN
4.0000 mL | Freq: Two times a day (BID) | RESPIRATORY_TRACT | Status: DC
  Administered 2018-09-29 – 2018-10-01 (×3): 4 mL via RESPIRATORY_TRACT
  Filled 2018-09-29 (×6): qty 4

## 2018-09-29 MED ORDER — IPRATROPIUM-ALBUTEROL 0.5-2.5 (3) MG/3ML IN SOLN
3.0000 mL | Freq: Four times a day (QID) | RESPIRATORY_TRACT | Status: DC
  Administered 2018-09-29 – 2018-10-01 (×7): 3 mL via RESPIRATORY_TRACT
  Filled 2018-09-29 (×8): qty 3

## 2018-09-29 MED ORDER — CLINDAMYCIN PHOSPHATE 600 MG/50ML IV SOLN
600.0000 mg | INTRAVENOUS | Status: AC
  Administered 2018-09-30: 600 mg via INTRAVENOUS
  Filled 2018-09-29: qty 50

## 2018-09-29 MED ORDER — MORPHINE SULFATE (PF) 2 MG/ML IV SOLN
1.0000 mg | INTRAVENOUS | Status: DC | PRN
  Administered 2018-09-29: 1 mg via INTRAVENOUS
  Filled 2018-09-29: qty 1

## 2018-09-29 NOTE — Consult Note (Signed)
Pulmonary Medicine          Date: 09/29/2018,   MRN# [id number] AMAREA MACDOWELL [birth year]     AdmissionWeight: 47.6 kg                 CurrentWeight: 47.6 kg      CHIEF COMPLAINT:   Acute on chronic respiratory failure   SUBJECTIVE    Patient reports poor sleep architecture due to hip pain, unfamiliar environment and coughing.     PAST MEDICAL HISTORY   Past Medical History:  Diagnosis Date   Arthritis    Atrial fibrillation ([place])    Bronchiectasis ([place])    CAD (coronary artery disease)    CAD (coronary artery disease) 07/30/2017   Dumping syndrome    Dyspnea    Dysrhythmia    afib   Essential hypertension, malignant 10/03/2013   Family history of adverse reaction to anesthesia    sister [name]   GERD (gastroesophageal reflux disease)    Headache    MIGRAINES   Hypertension    Lung disease    Myocardial infarction ([place]) 2007   Non-STEMI   [name] (postoperative nausea and vomiting)    Psoriasis    PUD (peptic ulcer disease)      SURGICAL HISTORY   Past Surgical History:  Procedure Laterality Date   BACK SURGERY     CHOLECYSTECTOMY     EYE SURGERY     FOOT SURGERY     KYPHOPLASTY N/A 07/05/2016   Procedure: KYPHOPLASTY T - 9;  Surgeon: Hessie Knows, MD;  Location: [facility];  Service: Orthopedics;  Laterality: N/A;   KYPHOPLASTY N/A 11/29/2017   Procedure: Iona Hansen;  Surgeon: Hessie Knows, MD;  Location: [facility];  Service: Orthopedics;  Laterality: N/A;  L2 and L3   KYPHOPLASTY N/A 12/18/2017   Procedure: KYPHOPLASTY L1;  Surgeon: Hessie Knows, MD;  Location: [facility];  Service: Orthopedics;  Laterality: N/A;   KYPHOPLASTY N/A 01/05/2018   Procedure: KYPHOPLASTY-T11,T12;  Surgeon: Hessie Knows, MD;  Location: [facility];  Service: Orthopedics;  Laterality: N/A;   KYPHOPLASTY N/A 04/05/2018   Procedure: T10 KYPHOPLASTY;  Surgeon: Hessie Knows, MD;  Location: [facility];  Service: Orthopedics;  Laterality: N/A;     KYPHOPLASTY N/A 04/12/2018   Procedure: KYPHOPLASTY T7,8;  Surgeon: Hessie Knows, MD;  Location: [facility];  Service: Orthopedics;  Laterality: N/A;   KYPHOPLASTY N/A 04/19/2018   Procedure: KYPHOPLASTY T5, T6;  Surgeon: Hessie Knows, MD;  Location: [facility];  Service: Orthopedics;  Laterality: N/A;   LUNG SURGERY  1990 and 1996   THOROCOTOMY WITH LOBECTOMY     LEFT LOWER THORACOTOMY / RIGHT MIDDLE LOBECTOMY     FAMILY HISTORY   Family History  Problem Relation Age of Onset   Hypertension Mother    Hypertension Father      SOCIAL HISTORY   Social History   Tobacco Use   Smoking status: Never Smoker   Smokeless tobacco: Never Used  Substance Use Topics   Alcohol use: No   Drug use: No     MEDICATIONS    Home Medication:    Current Medication:  Current Facility-Administered Medications:    acetaminophen (TYLENOL) tablet 650 mg, 650 mg, Oral, Q6H PRN, 650 mg at 09/29/18 0954 **OR** acetaminophen (TYLENOL) suppository 650 mg, 650 mg, Rectal, Q6H PRN, Demetrios Loll, MD   acidophilus (RISAQUAD) capsule 1 capsule, 1 capsule, Oral, Daily, Demetrios Loll, MD, 1 capsule at 09/29/18 1004   albuterol (PROVENTIL) (2.5 MG/3ML)  0.083% nebulizer solution 2.5 mg, 2.5 mg, Nebulization, Q2H PRN, Demetrios Loll, MD   azithromycin [facility]) tablet 250 mg, 250 mg, Oral, Daily, Ottie Glazier, MD, 250 mg at 09/29/18 2423   bisacodyl (DULCOLAX) EC tablet 5 mg, 5 mg, Oral, Daily PRN, Demetrios Loll, MD, 5 mg at 09/29/18 5361   calcium-vitamin D (OSCAL WITH D) 500-200 MG-UNIT per tablet 1 tablet, 1 tablet, Oral, Daily, Demetrios Loll, MD, 1 tablet at 09/29/18 4431   chlorpheniramine-HYDROcodone (TUSSIONEX) 10-8 MG/5ML suspension 5 mL, 5 mL, Oral, QHS, Demetrios Loll, MD, 5 mL at 09/28/18 2115   ciprofloxacin (CIPRO) tablet 500 mg, 500 mg, Oral, BID, Ottie Glazier, MD, 500 mg at 09/29/18 1003   diltiazem (CARDIZEM CD) 24 hr capsule 120 mg, 120 mg, Oral, Daily, Sudini, Srikar, MD, 120 mg at  09/29/18 1003   feeding supplement (ENSURE ENLIVE) (ENSURE ENLIVE) liquid 237 mL, 237 mL, Oral, BID BM, Sudini, Srikar, MD, 237 mL at 09/29/18 1019   ferrous sulfate tablet 325 mg, 325 mg, Oral, Daily, Demetrios Loll, MD, 325 mg at 09/29/18 5400   gabapentin (NEURONTIN) capsule 300 mg, 300 mg, Oral, QID, Demetrios Loll, MD, 300 mg at 09/29/18 1327   guaiFENesin-dextromethorphan (ROBITUSSIN DM) 100-10 MG/5ML syrup 5 mL, 5 mL, Oral, Q4H PRN, Demetrios Loll, MD   HYDROcodone-acetaminophen (NORCO) 7.5-325 MG per tablet 1 tablet, 1 tablet, Oral, Q4H PRN, Hooten, Laurice Record, MD   ipratropium-albuterol (DUONEB) 0.5-2.5 (3) MG/3ML nebulizer solution 3 mL, 3 mL, Nebulization, TID, Sudini, Srikar, MD, 3 mL at 09/29/18 1342   LORazepam (ATIVAN) tablet 0.5 mg, 0.5 mg, Oral, Q4H, Demetrios Loll, MD, 0.5 mg at 09/29/18 1005   metoprolol succinate (TOPROL-XL) 24 hr tablet 50 mg, 50 mg, Oral, QHS, Demetrios Loll, MD, 50 mg at 09/28/18 2112   morphine 2 MG/ML injection 1 mg, 1 mg, Intravenous, Q2H PRN, Hooten, Laurice Record, MD, 1 mg at 09/29/18 1014   multivitamin with minerals tablet 1 tablet, 1 tablet, Oral, Daily, Sudini, Alveta Heimlich, MD, 1 tablet at 09/29/18 0953   ondansetron (ZOFRAN) tablet 4 mg, 4 mg, Oral, Q6H PRN **OR** ondansetron (ZOFRAN) injection 4 mg, 4 mg, Intravenous, Q6H PRN, Demetrios Loll, MD   pantoprazole (PROTONIX) EC tablet 40 mg, 40 mg, Oral, Daily, Demetrios Loll, MD, 40 mg at 09/29/18 8676   senna-docusate (Senokot-S) tablet 1 tablet, 1 tablet, Oral, QHS PRN, Demetrios Loll, MD   sucralfate (CARAFATE) tablet 1 g, 1 g, Oral, QID, Demetrios Loll, MD, 1 g at 09/27/18 2144   traMADol (ULTRAM) tablet 50 mg, 50 mg, Oral, Q6H PRN, Demetrios Loll, MD, 50 mg at 09/29/18 1327   vitamin C (ASCORBIC ACID) tablet 1,000 mg, 1,000 mg, Oral, Daily, Demetrios Loll, MD, 1,000 mg at 09/29/18 1950    ALLERGIES   Sulfa antibiotics, Codeine, and Penicillins     REVIEW OF SYSTEMS    Review of Systems:  Gen:  Denies  fever, sweats, chills  weigh loss  HEENT: Denies blurred vision, double vision, ear pain, eye pain, hearing loss, nose bleeds, sore throat Cardiac:  No dizziness, chest pain or heaviness, chest tightness,edema Resp:   Denies cough or sputum porduction, shortness of breath,wheezing, hemoptysis,  Gi: Denies swallowing difficulty, stomach pain, nausea or vomiting, diarrhea, constipation, bowel incontinence Gu:  Denies bladder incontinence, burning urine Ext:   Denies Joint pain, stiffness or swelling Skin: Denies  skin rash, easy bruising or bleeding or hives Endoc:  Denies polyuria, polydipsia , polyphagia or weight change Psych:   Denies depression, insomnia or hallucinations  Other:  All other systems negative   VS: BP (!) 152/91 (BP Location: Right Arm)    Pulse 79    Temp 97.7 F (36.5 C) (Oral)    Resp 20    Ht _0  (1.651 m)    Wt 47.6 kg    SpO2 97%    BMI 17.47 kg/m      PHYSICAL EXAM    GENERAL:NAD, no fevers, chills, no weakness no fatigue HEAD: Normocephalic, atraumatic.  EYES: Pupils equal, round, reactive to light. Extraocular muscles intact. No scleral icterus.  MOUTH: Moist mucosal membrane. Dentition intact. No abscess noted.  EAR, NOSE, THROAT: Clear without exudates. No external lesions.  NECK: Supple. No thyromegaly. No nodules. No JVD.  PULMONARY:mild rhonchi bilaterally  CARDIOVASCULAR: S1 and S2. Regular rate and rhythm. No murmurs, rubs, or gallops. No edema. Pedal pulses 2+ bilaterally.  GASTROINTESTINAL: Soft, nontender, nondistended. No masses. Positive bowel sounds. No hepatosplenomegaly.  MUSCULOSKELETAL: No swelling, clubbing, or edema. Range of motion full in all extremities.  NEUROLOGIC: Cranial nerves II through XII are intact. No gross focal neurological deficits. Sensation intact. Reflexes intact.  SKIN: No ulceration, lesions, rashes, or cyanosis. Skin warm and dry. Turgor intact.  PSYCHIATRIC: Mood, affect within normal limits. The patient is awake, alert and oriented  x 3. Insight, judgment intact.       IMAGING    Ct Chest W Contrast  Result Date: 09/28/2018 CLINICAL DATA:  Inpatient. Dyspnea. Abnormal chest radiograph with question new suprahilar opacity on the right. Clinical concern for interstitial lung disease. EXAM: CT CHEST WITH CONTRAST TECHNIQUE: Multidetector CT imaging of the chest was performed during intravenous contrast administration. CONTRAST:  40m OMNIPAQUE IOHEXOL 300 MG/ML  SOLN COMPARISON:  Chest radiograph from one day prior. 04/08/2018 chest CT angiogram. FINDINGS: Cardiovascular: Top-normal heart size. No significant pericardial effusion/thickening. Left anterior descending coronary atherosclerosis. Atherosclerotic nonaneurysmal thoracic aorta. Normal caliber main pulmonary artery. No central pulmonary emboli. Mediastinum/Nodes: No discrete thyroid nodules. Unremarkable esophagus. No axillary adenopathy. Mildly enlarged 1.3 cm low right paratracheal lymph node (series 2/image 56) and mildly enlarged 1.4 cm subcarinal node (series 2/image 67), both stable since 04/08/2018. No new pathologically enlarged mediastinal nodes. No hilar adenopathy. Lungs/Pleura: Status post left lower and right middle lobectomies. No pneumothorax. Trace dependent left pleural effusion. No right pleural effusion. There is diffuse moderate cylindrical and varicoid bronchiectasis throughout both lungs with associated diffuse bronchial wall thickening. No acute consolidative airspace disease or lung masses. Mild patchy tree-in-bud opacity and scattered mucoid impaction associated with the areas of bronchiectasis, substantially decreased in the interval. Extensive parenchymal bands throughout the periphery of both lungs compatible with postinfectious/postinflammatory scarring, unchanged. No significant pulmonary nodules. Mild interlobular septal thickening and mosaic attenuation throughout both lungs, similar to prior. Upper abdomen: Cholecystectomy. Stable moderate  intrahepatic biliary ductal dilatation. Simple 3.7 cm lateral left renal cyst. Musculoskeletal: No aggressive appearing focal osseous lesions. Multilevel thoracolumbar vertebral compression fractures from T5 through L3 with associated vertebroplasty material at each level. IMPRESSION: 1. Diffuse cylindrical and varicoid bronchiectasis in both lungs with associated bandlike scarring throughout the periphery of both lungs. Mild patchy tree-in-bud opacities and scattered mucoid impaction is significantly improved since 04/08/2018 chest CT. 2. Prior left lower and right middle lobectomy. 3. Chronic mosaic attenuation in both lungs is probably due to air trapping. 4. Mild mediastinal lymphadenopathy is stable and probably reactive. 5. One vessel coronary atherosclerosis. Aortic Atherosclerosis (ICD10-I70.0). Electronically Signed   By: JIlona SorrelM.D.   On: 09/28/2018 16:53  Mr Pelvis Wo Contrast  Result Date: 09/27/2018 CLINICAL DATA:  Right hip pain after a fall. Abnormal radiographs suggesting right sacral fracture. EXAM: MRI PELVIS WITHOUT CONTRAST TECHNIQUE: Multiplanar multisequence MR imaging of the pelvis was performed. No intravenous contrast was administered. COMPARISON:  Radiographs dated 09/27/2018 FINDINGS: Musculoskeletal: There is an acute nondisplaced intertrochanteric fracture of the proximal right femur. There is an adjacent tear an intramuscular hematoma in the right gluteus maximus muscle. Is hemorrhage into the right greater trochanteric bursa and into the vastus lateralis muscle. There is also a small strain or tear of the proximal right gluteus maximus muscle. There is a subtle fracture of the third sacral segment extending into the sacral ala I to the right and left. There are subtle nondisplaced fractures of right and left pubic bodies. There are old healed fractures of the right inferior and superior pubic rami. Urinary Tract:  No abnormality visualized. Bowel:  Numerous diverticula in  distal colon.  Otherwise negative. Vascular/Lymphatic: No pathologically enlarged lymph nodes. No significant vascular abnormality seen. Reproductive:  No mass or other significant abnormality Other:  None. IMPRESSION: 1. Acute nondisplaced intertrochanteric fracture of the proximal right femur. 2. Nondisplaced subtle fractures of the S3 segment of the sacrum and of both pubic bodies. 3. Intramuscular tear and small hematoma and strain of the right gluteus maximus muscle. Small hematoma extending into the vastus lateralis muscle. Electronically Signed   By: Lorriane Shire M.D.   On: 09/27/2018 16:45   Dg Chest Port 1 View  Result Date: 09/27/2018 CLINICAL DATA:  COPD, fall EXAM: PORTABLE CHEST 1 VIEW COMPARISON:  10/09/2017 FINDINGS: Cardiac silhouette appears within normal limits. Calcific aortic knob. Diffuse interstitial thickening throughout both lungs. Prominent right suprahilar opacity, which appears more prominent compared to prior. There is hazy left basilar opacity, some of which may represent a component of hiatal hernia, better seen on prior exam. No pneumothorax is seen. The bones are diffusely demineralized. There are multiple thoracic spine compression deformity status post cement augmentation. No definite acute osseous abnormality within the limitations of this exam. IMPRESSION: 1. Prominent right suprahilar opacity, new from prior. Further evaluation with contrast-enhanced CT of the chest is recommended. 2. Hazy left basilar opacity. Electronically Signed   By: Davina Poke M.D.   On: 09/27/2018 13:59   Dg Hip Unilat With Pelvis 2-3 Views Right  Result Date: 09/27/2018 CLINICAL DATA:  73 year old female with a history of right hip pain after a fall EXAM: DG HIP (WITH OR WITHOUT PELVIS) 2-3V RIGHT COMPARISON:  None. FINDINGS: Osteopenia. Bony pelvic ring appears intact. Partially healed right inferior pubic ramus fracture. Disruption right-sided sacral foramen. Nondisplaced right  intertrochanteric fracture is identified on the cross-table lateral view. Changes of prior vertebral augmentation. IMPRESSION: Nondisplaced right intertrochanteric fracture, best seen on the cross-table lateral view Questionable acute right sacral fracture with disruption of the sacral foramen. Further evaluation with pelvic MRI may be useful. Incomplete healing/remodeling of right inferior pubic ramus fracture. Osteopenia. Electronically Signed   By: Corrie Mckusick D.O.   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      - current right hilar lesion may be shifted vascular structures vs new pulmonary malignancy. Will repeat CT chest      -patient dropped from 140lbs to 105.  She states in past year she actually gained weight appx 5lbs     - would recommend to continue chest physiotherapy with MetaNeb to recruit atelectatic segments.       -reviewed CT chest today - no new lesions, small denisty in lateral RML area likely due to post surgical changes.           Pulmonary preoperative clearance    - intermediate risk of post-operative pulmonary complications- 57.9% risk    -patient has some difficulty coughing up phlegm, she generally brings up "handfuls of phlegm" daily but is weaker and in pain and struggling to do that, we will upgrade her chest physiotherapy to improve pulmonary physiology prior to surgery.     -mucomyst 20% 48m BID    -MetaNeb q4h with saline to induce cough     Thank you for allowing me to participate in the care of this patient.   Patient/Family are satisfied with care plan  and all questions have been answered.  This document was prepared using Dragon voice recognition software and may include unintentional dictation errors.     FOttie Glazier M.D.  [facility] [place]

## 2018-09-29 NOTE — Progress Notes (Signed)
RN called Dr. Nehemiah Massed concerning consult for surgical clearance. Per Dr. Nehemiah Massed he will see the pt for consult tomorrow morning, 8/30.  Ted hose applied to LLE. SCDs applied bilaterally.

## 2018-09-29 NOTE — Consult Note (Signed)
Ana Washington is a 73 y.o. female  UK:[id number]  Primary Cardiologist: Neoma Laming on for Consultation: Preoperative clearance  HPI: This is a 73 year old white female with history of atrial fibrillation currently in sinus tachycardia presented to the hospital with hip fracture and I was asked to evaluate the patient prior to surgery.   Review of Systems: Patient denies any chest pain or shortness of breath at this time but did have some shortness of breath on exertion when she first came in   Past Medical History:  Diagnosis Date  . Arthritis   . Atrial fibrillation ([place])   . Bronchiectasis ([place])   . CAD (coronary artery disease)   . CAD (coronary artery disease) 07/30/2017  . Dumping syndrome   . Dyspnea   . Dysrhythmia    afib  . 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 Family history of adverse reaction to anesthesia    sister [name]  . GERD (gastroesophageal reflux disease)   . Headache    MIGRAINES  . Hypertension   . Lung disease   . Myocardial infarction ([place]) 2007   Non-STEMI  . [name] (postoperative nausea and vomiting)   . Psoriasis   . PUD (peptic ulcer disease)     Medications Prior to Admission  Medication Sig Dispense Refill  . acetaminophen (TYLENOL) 500 MG tablet Take 1,000 mg by mouth every 6 (six) hours as needed for mild pain or headache.     [name] amLODipine (NORVASC) 5 MG tablet Take 5 mg by mouth daily.     [name] apixaban (ELIQUIS) 5 MG TABS tablet Take 5 mg by mouth 2 (two) times daily.     . Ascorbic Acid (VITAMIN C) 1000 MG tablet Take 1,000 mg by mouth daily.    [name] aspirin EC 81 MG tablet Take 81 mg by mouth daily.    [name] azithromycin (ZITHROMAX) 250 MG tablet Take 250 mg by mouth daily.     . Calcium Carbonate-Vitamin D (OYSTER SHELL/VITAMIN D) 600-125 MG-UNIT TABS Take 1 tablet by mouth daily.    . chlorpheniramine-HYDROcodone (TUSSIONEX) 10-8 MG/5ML SUER Take 5 mLs by mouth at bedtime.     . ciprofloxacin (CIPRO) 500 MG tablet Take 500 mg by  mouth 2 (two) times daily.    [name] diltiazem (CARDIZEM CD) 120 MG 24 hr capsule Take 120 mg by mouth daily.    [name] diltiazem (CARDIZEM) 30 MG tablet Take 30 mg by mouth as needed for shortness of breath.    . diltiazem (CARDIZEM) 60 MG tablet Take 1 tablet (60 mg total) by mouth every 12 (twelve) hours. 60 tablet 1  . ferrous sulfate 325 (65 FE) MG EC tablet Take 325 mg by mouth daily.     [name] gabapentin (NEURONTIN) 300 MG capsule Take 300 mg by mouth 4 (four) times daily.     [name] LORazepam (ATIVAN) 0.5 MG tablet Take 0.5 mg by mouth every 4 (four) hours.     [name] losartan (COZAAR) 100 MG tablet Take 100 mg by mouth daily.     . metoprolol succinate (TOPROL-XL) 50 MG 24 hr tablet Take 50 mg by mouth at bedtime.     . potassium chloride (K-DUR) 10 MEQ tablet Take 10 mEq by mouth daily.    . Probiotic Product (ALIGN) 4 MG CAPS Take 4 mg by mouth daily.    . Sodium Chloride, Inhalant, 7 % NEBU Inhale 4 mLs into the lungs 3 (three) times daily.    . sucralfate (CARAFATE) 1 g  tablet Take 1 g by mouth 4 (four) times daily.    . pantoprazole (PROTONIX) 40 MG tablet Take 1 tablet (40 mg total) by mouth daily. 30 tablet 1     . acetylcysteine  4 mL Nebulization BID  . acidophilus  1 capsule Oral Daily  . azithromycin  250 mg Oral Daily  . calcium-vitamin D  1 tablet Oral Daily  . chlorpheniramine-HYDROcodone  5 mL Oral QHS  . ciprofloxacin  500 mg Oral BID  . diltiazem  120 mg Oral Daily  . feeding supplement (ENSURE ENLIVE)  237 mL Oral BID BM  . ferrous sulfate  325 mg Oral Daily  . gabapentin  300 mg Oral QID  . ipratropium-albuterol  3 mL Nebulization QID  . LORazepam  0.5 mg Oral Q4H  . metoprolol succinate  50 mg Oral QHS  . multivitamin with minerals  1 tablet Oral Daily  . pantoprazole  40 mg Oral Daily  . sucralfate  1 g Oral QID  . vitamin C  1,000 mg Oral Daily    Infusions: . clindamycin (CLEOCIN) IV Stopped (09/29/18 1644)    Allergies  Allergen Reactions  . Sulfa Antibiotics  Diarrhea  . Codeine Nausea And Vomiting  . Penicillins Rash    Has patient had a PCN reaction causing immediate rash, facial/tongue/throat swelling, SOB or lightheadedness with hypotension: Unknown Has patient had a PCN reaction causing severe rash involving mucus membranes or skin necrosis: Unknown Has patient had a PCN reaction that required hospitalization: Unknown Has patient had a PCN reaction occurring within the last 10 years: No If all of the above answers are "NO", then may proceed with Cephalosporin use.     Social History   Socioeconomic History  . Marital status: Married    Spouse name: Not on file  . Number of children: Not on file  . Years of education: Not on file  . Highest education level: Not on file  Occupational History  . Not on file  Social Needs  . Financial resource strain: Not on file  . Food insecurity    Worry: Not on file    Inability: Not on file  . Transportation needs    Medical: Not on file    Non-medical: Not on file  Tobacco Use  . Smoking status: Never Smoker  . Smokeless tobacco: Never Used  Substance and Sexual Activity  . Alcohol use: No  . Drug use: No  . Sexual activity: Not on file  Lifestyle  . Physical activity    Days per week: Not on file    Minutes per session: Not on file  . Stress: Not on file  Relationships  . Social Herbalist on phone: Not on file    Gets together: Not on file    Attends religious service: Not on file    Active member of club or organization: Not on file    Attends meetings of clubs or organizations: Not on file    Relationship status: Not on file  . Intimate partner violence    Fear of current or ex partner: Not on file    Emotionally abused: Not on file    Physically abused: Not on file    Forced sexual activity: Not on file  Other Topics Concern  . Not on file  Social History Narrative  . Not on file    Family History  Problem Relation Age of Onset  . Hypertension Mother   .  Hypertension  Father     PHYSICAL EXAM: Vitals:   09/29/18 0832 09/29/18 1344  BP: (!) 152/91   Pulse: 79   Resp: 20   Temp: 97.7 F (36.5 C)   SpO2: 95% 97%     Intake/Output Summary (Last 24 hours) at 09/29/2018 1803 Last data filed at 09/29/2018 0300 Gross per 24 hour  Intake 268.06 ml  Output 150 ml  Net 118.06 ml    General:  Well appearing. No respiratory difficulty HEENT: normal Neck: supple. no JVD. Carotids 2+ bilat; no bruits. No lymphadenopathy or thryomegaly appreciated. Cor: PMI nondisplaced. Regular rate & rhythm. No rubs, gallops or murmurs. Lungs: clear Abdomen: soft, nontender, nondistended. No hepatosplenomegaly. No bruits or masses. Good bowel sounds. Extremities: no cyanosis, clubbing, rash, edema Neuro: alert & oriented x 3, cranial nerves grossly intact. moves all 4 extremities w/o difficulty. Affect pleasant.  ECG: Sinus tachycardia with nonspecific ST-T changes  Results for orders placed or performed during the hospital encounter of 09/27/18 (from the past 24 hour(s))  Basic metabolic panel     Status: Abnormal   Collection Time: 09/29/18  4:15 AM  Result Value Ref Range   Sodium 136 135 - 145 mmol/L   Potassium 4.6 3.5 - 5.1 mmol/L   Chloride 94 (L) 98 - 111 mmol/L   CO2 35 (H) 22 - 32 mmol/L   Glucose, Bld 94 70 - 99 mg/dL   BUN 21 8 - 23 mg/dL   Creatinine, Ser 0.67 0.44 - 1.00 mg/dL   Calcium 8.8 (L) 8.9 - 10.3 mg/dL   GFR calc non Af Amer >60 >60 mL/min   GFR calc Af Amer >60 >60 mL/min   Anion gap 7 5 - 15   Ct Chest W Contrast  Result Date: 09/28/2018 CLINICAL DATA:  Inpatient. Dyspnea. Abnormal chest radiograph with question new suprahilar opacity on the right. Clinical concern for interstitial lung disease. EXAM: CT CHEST WITH CONTRAST TECHNIQUE: Multidetector CT imaging of the chest was performed during intravenous contrast administration. CONTRAST:  14mL OMNIPAQUE IOHEXOL 300 MG/ML  SOLN COMPARISON:  Chest radiograph from one day  prior. 04/08/2018 chest CT angiogram. FINDINGS: Cardiovascular: Top-normal heart size. No significant pericardial effusion/thickening. Left anterior descending coronary atherosclerosis. Atherosclerotic nonaneurysmal thoracic aorta. Normal caliber main pulmonary artery. No central pulmonary emboli. Mediastinum/Nodes: No discrete thyroid nodules. Unremarkable esophagus. No axillary adenopathy. Mildly enlarged 1.3 cm low right paratracheal lymph node (series 2/image 56) and mildly enlarged 1.4 cm subcarinal node (series 2/image 67), both stable since 04/08/2018. No new pathologically enlarged mediastinal nodes. No hilar adenopathy. Lungs/Pleura: Status post left lower and right middle lobectomies. No pneumothorax. Trace dependent left pleural effusion. No right pleural effusion. There is diffuse moderate cylindrical and varicoid bronchiectasis throughout both lungs with associated diffuse bronchial wall thickening. No acute consolidative airspace disease or lung masses. Mild patchy tree-in-bud opacity and scattered mucoid impaction associated with the areas of bronchiectasis, substantially decreased in the interval. Extensive parenchymal bands throughout the periphery of both lungs compatible with postinfectious/postinflammatory scarring, unchanged. No significant pulmonary nodules. Mild interlobular septal thickening and mosaic attenuation throughout both lungs, similar to prior. Upper abdomen: Cholecystectomy. Stable moderate intrahepatic biliary ductal dilatation. Simple 3.7 cm lateral left renal cyst. Musculoskeletal: No aggressive appearing focal osseous lesions. Multilevel thoracolumbar vertebral compression fractures from T5 through L3 with associated vertebroplasty material at each level. IMPRESSION: 1. Diffuse cylindrical and varicoid bronchiectasis in both lungs with associated bandlike scarring throughout the periphery of both lungs. Mild patchy tree-in-bud opacities and scattered mucoid impaction  is  significantly improved since 04/08/2018 chest CT. 2. Prior left lower and right middle lobectomy. 3. Chronic mosaic attenuation in both lungs is probably due to air trapping. 4. Mild mediastinal lymphadenopathy is stable and probably reactive. 5. One vessel coronary atherosclerosis. Aortic Atherosclerosis (ICD10-I70.0). Electronically Signed   By: Ilona Sorrel M.D.   On: 09/28/2018 16:53     ASSESSMENT AND PLAN: Status post hip fracture needing surgery in a.m. but had is asymptomatic with borderline elevated troponin and unremarkable EKG with monitor now showing sinus rhythm with 70 bpm and clinically stable.  Patient is already feeling better with starting metoprolol and continuing her current medication with no chest pain or heart failure symptoms.  Advise proceeding with surgery.  KHAN,SHAUKAT A

## 2018-09-29 NOTE — Progress Notes (Signed)
[facility] [place] NAME: Ana Washington    MR#:  [id number]  DATE OF BIRTH:  1945/12/27  SUBJECTIVE:  Patient having right hip pain.  No other issues overnight.  REVIEW OF SYSTEMS:    Review of Systems  Constitutional: Negative for fever, chills weight loss HENT: Negative for ear pain, nosebleeds, congestion, facial swelling, rhinorrhea, neck pain, neck stiffness and ear discharge.   Respiratory: Negative for cough, shortness of breath, wheezing  Cardiovascular: Negative for chest pain, palpitations and leg swelling.  Gastrointestinal: Negative for heartburn, abdominal pain, vomiting, diarrhea or consitpation Genitourinary: Negative for dysuria, urgency, frequency, hematuria Musculoskeletal: Negative for back pain or ++RIGHT HIP PAIN Neurological: Negative for dizziness, seizures, syncope, focal weakness,  numbness and headaches.  Hematological: Does not bruise/bleed easily.  Psychiatric/Behavioral: Negative for hallucinations, confusion, dysphoric mood    Tolerating Diet: yes      DRUG ALLERGIES:   Allergies  Allergen Reactions  . Sulfa Antibiotics Diarrhea  . Codeine Nausea And Vomiting  . Penicillins Rash    Has patient had a PCN reaction causing immediate rash, facial/tongue/throat swelling, SOB or lightheadedness with hypotension: Unknown Has patient had a PCN reaction causing severe rash involving mucus membranes or skin necrosis: Unknown Has patient had a PCN reaction that required hospitalization: Unknown Has patient had a PCN reaction occurring within the last 10 years: No If all of the above answers are "NO", then may proceed with Cephalosporin use.     VITALS:  Blood pressure (!) 152/91, pulse 79, temperature 97.7 F (36.5 C), temperature source Oral, resp. rate 20, height _0  (1.651 m), weight 47.6 kg, SpO2 95 %.  PHYSICAL EXAMINATION:  Constitutional: Appears well-developed and well-nourished. No  distress. HENT: Normocephalic. [name] Oropharynx is clear and moist.  Eyes: Conjunctivae and EOM are normal. PERRLA, no scleral icterus.  Neck: Normal ROM. Neck supple. No JVD. No tracheal deviation. CVS: RRR, S1/S2 +, no murmurs, no gallops, no carotid bruit.  Pulmonary: Effort and breath sounds normal, no stridor, rhonchi, wheezes, rales.  Abdominal: Soft. BS +,  no distension, tenderness, rebound or guarding.  Musculoskeletal: right leg shorter slight rotation. No edema and no tenderness.  Neuro: Alert. CN 2-12 grossly intact. No focal deficits. Skin: Skin is warm and dry. No rash noted. Psychiatric: Normal mood and affect.      LABORATORY PANEL:   CBC Recent Labs  Lab 09/28/18 1440  WBC 23.1*  HGB 11.5*  HCT 34.9*  PLT 289   ------------------------------------------------------------------------------------------------------------------  Chemistries  Recent Labs  Lab 09/27/18 1309 09/27/18 1508  09/29/18 0415  NA 141  --    < > 136  K 3.0*  --    < > 4.6  CL 93*  --    < > 94*  CO2 35*  --    < > 35*  GLUCOSE 118*  --    < > 94  BUN 14  --    < > 21  CREATININE 0.58  --    < > 0.67  CALCIUM 9.3  --    < > 8.8*  MG  --  1.6*  --   --   AST 30  --   --   --   ALT 20  --   --   --   ALKPHOS 49  --   --   --   BILITOT 0.7  --   --   --    < > = values  in this interval not displayed.   ------------------------------------------------------------------------------------------------------------------  Cardiac Enzymes No results for input(s): TROPONINI in the last 168 hours. ------------------------------------------------------------------------------------------------------------------  RADIOLOGY:  Ct Chest W Contrast  Result Date: 09/28/2018 CLINICAL DATA:  Inpatient. Dyspnea. Abnormal chest radiograph with question new suprahilar opacity on the right. Clinical concern for interstitial lung disease. EXAM: CT CHEST WITH CONTRAST TECHNIQUE: Multidetector CT imaging  of the chest was performed during intravenous contrast administration. CONTRAST:  27m OMNIPAQUE IOHEXOL 300 MG/ML  SOLN COMPARISON:  Chest radiograph from one day prior. 04/08/2018 chest CT angiogram. FINDINGS: Cardiovascular: Top-normal heart size. No significant pericardial effusion/thickening. Left anterior descending coronary atherosclerosis. Atherosclerotic nonaneurysmal thoracic aorta. Normal caliber main pulmonary artery. No central pulmonary emboli. Mediastinum/Nodes: No discrete thyroid nodules. Unremarkable esophagus. No axillary adenopathy. Mildly enlarged 1.3 cm low right paratracheal lymph node (series 2/image 56) and mildly enlarged 1.4 cm subcarinal node (series 2/image 67), both stable since 04/08/2018. No new pathologically enlarged mediastinal nodes. No hilar adenopathy. Lungs/Pleura: Status post left lower and right middle lobectomies. No pneumothorax. Trace dependent left pleural effusion. No right pleural effusion. There is diffuse moderate cylindrical and varicoid bronchiectasis throughout both lungs with associated diffuse bronchial wall thickening. No acute consolidative airspace disease or lung masses. Mild patchy tree-in-bud opacity and scattered mucoid impaction associated with the areas of bronchiectasis, substantially decreased in the interval. Extensive parenchymal bands throughout the periphery of both lungs compatible with postinfectious/postinflammatory scarring, unchanged. No significant pulmonary nodules. Mild interlobular septal thickening and mosaic attenuation throughout both lungs, similar to prior. Upper abdomen: Cholecystectomy. Stable moderate intrahepatic biliary ductal dilatation. Simple 3.7 cm lateral left renal cyst. Musculoskeletal: No aggressive appearing focal osseous lesions. Multilevel thoracolumbar vertebral compression fractures from T5 through L3 with associated vertebroplasty material at each level. IMPRESSION: 1. Diffuse cylindrical and varicoid bronchiectasis  in both lungs with associated bandlike scarring throughout the periphery of both lungs. Mild patchy tree-in-bud opacities and scattered mucoid impaction is significantly improved since 04/08/2018 chest CT. 2. Prior left lower and right middle lobectomy. 3. Chronic mosaic attenuation in both lungs is probably due to air trapping. 4. Mild mediastinal lymphadenopathy is stable and probably reactive. 5. One vessel coronary atherosclerosis. Aortic Atherosclerosis (ICD10-I70.0). Electronically Signed   By: JIlona SorrelM.D.   On: 09/28/2018 16:53   Mr Pelvis Wo Contrast  Result Date: 09/27/2018 CLINICAL DATA:  Right hip pain after a fall. Abnormal radiographs suggesting right sacral fracture. EXAM: MRI PELVIS WITHOUT CONTRAST TECHNIQUE: Multiplanar multisequence MR imaging of the pelvis was performed. No intravenous contrast was administered. COMPARISON:  Radiographs dated 09/27/2018 FINDINGS: Musculoskeletal: There is an acute nondisplaced intertrochanteric fracture of the proximal right femur. There is an adjacent tear an intramuscular hematoma in the right gluteus maximus muscle. Is hemorrhage into the right greater trochanteric bursa and into the vastus lateralis muscle. There is also a small strain or tear of the proximal right gluteus maximus muscle. There is a subtle fracture of the third sacral segment extending into the sacral ala I to the right and left. There are subtle nondisplaced fractures of right and left pubic bodies. There are old healed fractures of the right inferior and superior pubic rami. Urinary Tract:  No abnormality visualized. Bowel:  Numerous diverticula in distal colon.  Otherwise negative. Vascular/Lymphatic: No pathologically enlarged lymph nodes. No significant vascular abnormality seen. Reproductive:  No mass or other significant abnormality Other:  None. IMPRESSION: 1. Acute nondisplaced intertrochanteric fracture of the proximal right femur. 2. Nondisplaced subtle fractures of the S3  segment of  the sacrum and of both pubic bodies. 3. Intramuscular tear and small hematoma and strain of the right gluteus maximus muscle. Small hematoma extending into the vastus lateralis muscle. Electronically Signed   By: Lorriane Shire M.D.   On: 09/27/2018 16:45   Dg Chest Port 1 View  Result Date: 09/27/2018 CLINICAL DATA:  COPD, fall EXAM: PORTABLE CHEST 1 VIEW COMPARISON:  10/09/2017 FINDINGS: Cardiac silhouette appears within normal limits. Calcific aortic knob. Diffuse interstitial thickening throughout both lungs. Prominent right suprahilar opacity, which appears more prominent compared to prior. There is hazy left basilar opacity, some of which may represent a component of hiatal hernia, better seen on prior exam. No pneumothorax is seen. The bones are diffusely demineralized. There are multiple thoracic spine compression deformity status post cement augmentation. No definite acute osseous abnormality within the limitations of this exam. IMPRESSION: 1. Prominent right suprahilar opacity, new from prior. Further evaluation with contrast-enhanced CT of the chest is recommended. 2. Hazy left basilar opacity. Electronically Signed   By: Davina Poke M.D.   On: 09/27/2018 13:59   Dg Hip Unilat With Pelvis 2-3 Views Right  Result Date: 09/27/2018 CLINICAL DATA:  73 year old female with a history of right hip pain after a fall EXAM: DG HIP (WITH OR WITHOUT PELVIS) 2-3V RIGHT COMPARISON:  None. FINDINGS: Osteopenia. Bony pelvic ring appears intact. Partially healed right inferior pubic ramus fracture. Disruption right-sided sacral foramen. Nondisplaced right intertrochanteric fracture is identified on the cross-table lateral view. Changes of prior vertebral augmentation. IMPRESSION: Nondisplaced right intertrochanteric fracture, best seen on the cross-table lateral view Questionable acute right sacral fracture with disruption of the sacral foramen. Further evaluation with pelvic MRI may be useful.  Incomplete healing/remodeling of right inferior pubic ramus fracture. Osteopenia. Electronically Signed   By: Corrie Mckusick D.O.   On: 09/27/2018 14:04     ASSESSMENT AND PLAN:    73 year old female with chronic hypoxic respiratory failure on 2 L of oxygen and CAD who presented to the emergency room due to mechanical fall and right sided hip pain.  1.  Nondisplaced right intertrochanteric fracture: Plan to go for surgery tomorrow.  Patient at intermediate risk for intermediate risk procedure. DVT prophylaxis as per orthopedic surgery.  2.  Acute on chronic hypoxic respiratory failure with history of bronchiectasis/MAC and left lower lobe resection: Pulmonary consult appreciated Continue chest physiotherapy Continue azithromycin and ciprofloxacin for prophylaxis due to history of chronic Mycobacterium AVM infection. WBC may be due to steroids. No evidence of pneumonia or COPD exacerbation as per pulmonary.   3.  CAD: Continue metoprolol  4.  Essential hypertension: Continue diltiazem and metoprolol Holding Norvasc and losartan due to normal blood pressure.    5.  PAF: Patient currently in normal sinus rhythm Holding anticoagulation for planned surgery tomorrow Continue diltiazem and metoprolol for heart rate control.   Management plans discussed with the patient and she is in agreement.  CODE STATUS: dnr  TOTAL TIME TAKING CARE OF THIS PATIENT: 30 minutes.     POSSIBLE D/C 3 days, DEPENDING ON CLINICAL CONDITION.   Bettey Costa M.D on 09/29/2018 at 11:06 AM  Between 7am to 6pm - Pager - [phone] After 6pm go to [email or web address] - password [email or web address] [facility]  Office  [phone]  CC: Primary care physician; [name], MD  Note: This dictation was prepared with Dragon dictation along with smaller phrase technology. Any transcriptional errors that result from this process are unintentional.

## 2018-09-29 NOTE — Progress Notes (Signed)
Family Meeting Note  Advance Directive:yes  Today a meeting too The following clinical team members were present during this meeting:MD  The following were discussed:Patient's diagnosis: , Patient's progosis: Unable to determine and Goals for treatment: DNR  Additional follow-up to be provided: patient has DNR  She has directives filled out and voices she wishes to continue DNR status.   Time spent during discussion:16 minutes  Bettey Costa, MD

## 2018-09-29 NOTE — Progress Notes (Signed)
ORTHOPAEDICS PROGRESS NOTE  PATIENT NAME: Ana Washington DOB: 22-Jul-1945  MRN: [id number]  Subjective: Overall patient doing well.  Right hip pain 7 out of 10.  No chest pain, shortness of breath or abdominal pain.  No nausea or vomiting.  She states she has not had a bowel movement since Wednesday.  Tolerating p.o. well.  Objective: Vital signs in last 24 hours: Temp:  [97.9 F (36.6 C)-99.4 F (37.4 C)] 99.4 F (37.4 C) (08/28 2327) Pulse Rate:  [82-100] 95 (08/28 2327) Resp:  [16-20] 20 (08/28 2327) BP: (124-140)/(78-86) 135/86 (08/28 2327) SpO2:  [92 %-97 %] 97 % (08/28 2327)  Intake/Output from previous day: 08/28 0701 - 08/29 0700 In: 590.3 [P.O.:120; IV Piggyback:220.3] Out: 250 [Urine:250]  Recent Labs    09/27/18 1309 09/28/18 0446 09/28/18 1440 09/29/18 0415  WBC 18.7* 15.4* 23.1*  --   HGB 12.9 11.8* 11.5*  --   HCT 38.3 35.1* 34.9*  --   PLT 318 253 289  --   K 3.0* 3.8  --  4.6  CL 93* 94*  --  94*  CO2 35* 33*  --  35*  BUN 14 18  --  21  CREATININE 0.58 0.65  --  0.67  GLUCOSE 118* 163*  --  94  CALCIUM 9.3 8.7*  --  8.8*    EXAM General: Well-developed well-nourished female seen in no apparent discomfort. Abdomen: Soft nontender nondistended bowel sounds normal. Right lower extremity: Slight rotation.  Soft tissues are soft.  No significant swelling.  Sensation is intact distally.  Neurovascular intact right lower extremity   Assessment: Right intertrochanteric femur fracture  Plan: 1.  Hip pain seems to be well controlled, patient resting comfortably.  No distress 2.  Right hip surgery tentatively scheduled for tomorrow pending cardiac clearance.  Order for n.p.o. after midnight placed. 3.   Encourage nursing to utilize bowel regimen   Rachelle Hora PA-C

## 2018-09-30 ENCOUNTER — Encounter: Payer: Self-pay | Admitting: Anesthesiology

## 2018-09-30 ENCOUNTER — Encounter
Admission: EM | Disposition: A | Discharge status: Skilled Nursing Facility | Payer: Self-pay | Source: Home / Self Care | Attending: Internal Medicine

## 2018-09-30 ENCOUNTER — Inpatient Hospital Stay: Payer: Medicare Other | Admitting: Anesthesiology

## 2018-09-30 ENCOUNTER — Inpatient Hospital Stay: Payer: Medicare Other

## 2018-09-30 HISTORY — PX: INTRAMEDULLARY (IM) NAIL INTERTROCHANTERIC: SHX5875

## 2018-09-30 LAB — CBC
HCT: 32.7 % — ABNORMAL LOW (ref 36.0–46.0)
Hemoglobin: 10.7 g/dL — ABNORMAL LOW (ref 12.0–15.0)
MCH: 34 pg (ref 26.0–34.0)
MCHC: 32.7 g/dL (ref 30.0–36.0)
MCV: 103.8 fL — ABNORMAL HIGH (ref 80.0–100.0)
Platelets: 223 10*3/uL (ref 150–400)
RBC: 3.15 MIL/uL — ABNORMAL LOW (ref 3.87–5.11)
RDW: 12.5 % (ref 11.5–15.5)
WBC: 15.3 10*3/uL — ABNORMAL HIGH (ref 4.0–10.5)
nRBC: 0 % (ref 0.0–0.2)

## 2018-09-30 LAB — BASIC METABOLIC PANEL
Anion gap: 9 (ref 5–15)
BUN: 21 mg/dL (ref 8–23)
CO2: 35 mmol/L — ABNORMAL HIGH (ref 22–32)
Calcium: 9.2 mg/dL (ref 8.9–10.3)
Chloride: 89 mmol/L — ABNORMAL LOW (ref 98–111)
Creatinine, Ser: 0.67 mg/dL (ref 0.44–1.00)
GFR calc Af Amer: >60 mL/min (ref >60)
GFR calc non Af Amer: >60 mL/min (ref >60)
Glucose, Bld: 92 mg/dL (ref 70–99)
Potassium: 4 mmol/L (ref 3.5–5.1)
Sodium: 133 mmol/L — ABNORMAL LOW (ref 135–145)

## 2018-09-30 LAB — SARS CORONAVIRUS 2 BY RT PCR (HOSPITAL ORDER, PERFORMED IN ~~LOC~~ HOSPITAL LAB): SARS Coronavirus 2: NEGATIVE

## 2018-09-30 LAB — URINE CULTURE: Culture: >=100000 — AB

## 2018-09-30 SURGERY — FIXATION, FRACTURE, INTERTROCHANTERIC, WITH INTRAMEDULLARY ROD
Anesthesia: Spinal | Laterality: Right

## 2018-09-30 MED ORDER — BUPIVACAINE HCL (PF) 0.5 % IJ SOLN
INTRAMUSCULAR | Status: DC | PRN
  Administered 2018-09-30: 2.5 mL

## 2018-09-30 MED ORDER — METHYLPREDNISOLONE SODIUM SUCC 40 MG IJ SOLR
40.0000 mg | Freq: Two times a day (BID) | INTRAMUSCULAR | Status: DC
  Administered 2018-09-30 – 2018-10-01 (×4): 40 mg via INTRAVENOUS
  Filled 2018-09-30 (×4): qty 1

## 2018-09-30 MED ORDER — APIXABAN 5 MG PO TABS
5.0000 mg | ORAL_TABLET | Freq: Two times a day (BID) | ORAL | Status: DC
  Administered 2018-10-01 – 2018-10-11 (×21): 5 mg via ORAL
  Filled 2018-09-30 (×21): qty 1

## 2018-09-30 MED ORDER — TRAMADOL HCL 50 MG PO TABS
50.0000 mg | ORAL_TABLET | ORAL | Status: DC | PRN
  Administered 2018-09-30: 13:00:00 100 mg via ORAL
  Administered 2018-10-01 – 2018-10-09 (×16): 50 mg via ORAL
  Filled 2018-09-30 (×4): qty 1
  Filled 2018-09-30: qty 2
  Filled 2018-09-30 (×4): qty 1
  Filled 2018-09-30: qty 2
  Filled 2018-09-30: qty 1
  Filled 2018-09-30: qty 2
  Filled 2018-09-30 (×2): qty 1
  Filled 2018-09-30: qty 2
  Filled 2018-09-30 (×2): qty 1

## 2018-09-30 MED ORDER — ONDANSETRON HCL 4 MG/2ML IJ SOLN: 4.0000 mg | Freq: Four times a day (QID) | INTRAMUSCULAR | Status: DC | PRN

## 2018-09-30 MED ORDER — GENTAMICIN SULFATE 40 MG/ML IJ SOLN
INTRAMUSCULAR | Status: AC
  Filled 2018-09-30: qty 2

## 2018-09-30 MED ORDER — MENTHOL 3 MG MT LOZG
1.0000 | LOZENGE | OROMUCOSAL | Status: DC | PRN
  Filled 2018-09-30: qty 9

## 2018-09-30 MED ORDER — LACTATED RINGERS IV SOLN
INTRAVENOUS | Status: DC | PRN
  Administered 2018-09-30 (×2): via INTRAVENOUS

## 2018-09-30 MED ORDER — EPHEDRINE SULFATE 50 MG/ML IJ SOLN
INTRAMUSCULAR | Status: DC | PRN
  Administered 2018-09-30 (×2): 10 mg via INTRAVENOUS

## 2018-09-30 MED ORDER — FENTANYL CITRATE (PF) 100 MCG/2ML IJ SOLN: 25.0000 ug | INTRAMUSCULAR | Status: DC | PRN

## 2018-09-30 MED ORDER — SODIUM CHLORIDE (PF) 0.9 % IJ SOLN
INTRAMUSCULAR | Status: AC
  Filled 2018-09-30: qty 10

## 2018-09-30 MED ORDER — ACETAMINOPHEN 10 MG/ML IV SOLN
INTRAVENOUS | Status: AC
  Filled 2018-09-30: qty 100

## 2018-09-30 MED ORDER — OXYCODONE HCL 5 MG PO TABS
5.0000 mg | ORAL_TABLET | ORAL | Status: DC | PRN
  Administered 2018-10-11: 5 mg via ORAL
  Filled 2018-09-30 (×3): qty 1

## 2018-09-30 MED ORDER — SODIUM CHLORIDE 0.9 % IV SOLN
1.0000 g | INTRAVENOUS | Status: DC
  Administered 2018-09-30: 09:00:00 1 g via INTRAVENOUS
  Administered 2018-09-30: 09:00:00 via INTRAVENOUS
  Filled 2018-09-30: qty 1

## 2018-09-30 MED ORDER — ONDANSETRON HCL 4 MG/2ML IJ SOLN
INTRAMUSCULAR | Status: AC
  Filled 2018-09-30: qty 2

## 2018-09-30 MED ORDER — PHENYLEPHRINE HCL (PRESSORS) 10 MG/ML IV SOLN
INTRAVENOUS | Status: AC
  Filled 2018-09-30: qty 1

## 2018-09-30 MED ORDER — ONDANSETRON HCL 4 MG PO TABS: 4.0000 mg | ORAL_TABLET | Freq: Four times a day (QID) | ORAL | Status: DC | PRN

## 2018-09-30 MED ORDER — SODIUM CHLORIDE 0.9 % IV SOLN
INTRAVENOUS | Status: DC
  Administered 2018-09-30: 13:00:00 via INTRAVENOUS

## 2018-09-30 MED ORDER — ONDANSETRON HCL 4 MG/2ML IJ SOLN
4.0000 mg | Freq: Once | INTRAMUSCULAR | Status: AC
  Administered 2018-09-30: 4 mg via INTRAVENOUS

## 2018-09-30 MED ORDER — BISACODYL 10 MG RE SUPP: 10.0000 mg | Freq: Every day | RECTAL | Status: DC | PRN

## 2018-09-30 MED ORDER — KETAMINE HCL 10 MG/ML IJ SOLN
INTRAMUSCULAR | Status: DC | PRN
  Administered 2018-09-30: 50 mg via INTRAVENOUS
  Administered 2018-09-30: 20 mg via INTRAVENOUS

## 2018-09-30 MED ORDER — DEXMEDETOMIDINE HCL 200 MCG/2ML IV SOLN
INTRAVENOUS | Status: DC | PRN
  Administered 2018-09-30: 8 ug via INTRAVENOUS

## 2018-09-30 MED ORDER — PHENOL 1.4 % MT LIQD
1.0000 | OROMUCOSAL | Status: DC | PRN
  Filled 2018-09-30: qty 177

## 2018-09-30 MED ORDER — SENNOSIDES-DOCUSATE SODIUM 8.6-50 MG PO TABS
1.0000 | ORAL_TABLET | Freq: Two times a day (BID) | ORAL | Status: DC
  Administered 2018-09-30 – 2018-10-11 (×22): 1 via ORAL
  Filled 2018-09-30 (×22): qty 1

## 2018-09-30 MED ORDER — HYDROMORPHONE HCL 1 MG/ML IJ SOLN
0.5000 mg | INTRAMUSCULAR | Status: DC | PRN
  Administered 2018-10-04: 13:00:00 0.5 mg via INTRAVENOUS
  Filled 2018-09-30: qty 1

## 2018-09-30 MED ORDER — IPRATROPIUM-ALBUTEROL 0.5-2.5 (3) MG/3ML IN SOLN
RESPIRATORY_TRACT | Status: AC
  Filled 2018-09-30: qty 3

## 2018-09-30 MED ORDER — CLINDAMYCIN PHOSPHATE 600 MG/50ML IV SOLN
600.0000 mg | Freq: Four times a day (QID) | INTRAVENOUS | Status: AC
  Administered 2018-09-30 (×2): 600 mg via INTRAVENOUS
  Filled 2018-09-30 (×4): qty 50

## 2018-09-30 MED ORDER — PROPOFOL 10 MG/ML IV BOLUS
INTRAVENOUS | Status: DC | PRN
  Administered 2018-09-30: 30 mg via INTRAVENOUS

## 2018-09-30 MED ORDER — BUPIVACAINE HCL (PF) 0.5 % IJ SOLN
INTRAMUSCULAR | Status: AC
  Filled 2018-09-30: qty 10

## 2018-09-30 MED ORDER — OXYCODONE HCL 5 MG/5ML PO SOLN: 5.0000 mg | Freq: Once | ORAL | Status: DC | PRN

## 2018-09-30 MED ORDER — METOCLOPRAMIDE HCL 5 MG/ML IJ SOLN: 5.0000 mg | Freq: Three times a day (TID) | INTRAMUSCULAR | Status: DC | PRN

## 2018-09-30 MED ORDER — ACETAMINOPHEN 10 MG/ML IV SOLN
1000.0000 mg | Freq: Four times a day (QID) | INTRAVENOUS | Status: AC
  Administered 2018-09-30 – 2018-10-01 (×3): 1000 mg via INTRAVENOUS
  Filled 2018-09-30 (×2): qty 100

## 2018-09-30 MED ORDER — FLEET ENEMA 7-19 GM/118ML RE ENEM: 1.0000 | ENEMA | Freq: Once | RECTAL | Status: DC | PRN

## 2018-09-30 MED ORDER — MAGNESIUM HYDROXIDE 400 MG/5ML PO SUSP
30.0000 mL | Freq: Every day | ORAL | Status: DC | PRN
  Administered 2018-10-01 – 2018-10-02 (×2): 30 mL via ORAL
  Filled 2018-09-30 (×2): qty 30

## 2018-09-30 MED ORDER — OXYCODONE HCL 5 MG PO TABS
10.0000 mg | ORAL_TABLET | ORAL | Status: DC | PRN
  Filled 2018-09-30: qty 2

## 2018-09-30 MED ORDER — IPRATROPIUM-ALBUTEROL 0.5-2.5 (3) MG/3ML IN SOLN: 3.0000 mL | RESPIRATORY_TRACT | Status: DC

## 2018-09-30 MED ORDER — DEXMEDETOMIDINE HCL IN NACL 80 MCG/20ML IV SOLN
INTRAVENOUS | Status: AC
  Filled 2018-09-30: qty 20

## 2018-09-30 MED ORDER — KETAMINE HCL 50 MG/ML IJ SOLN
INTRAMUSCULAR | Status: AC
  Filled 2018-09-30: qty 10

## 2018-09-30 MED ORDER — METOCLOPRAMIDE HCL 10 MG PO TABS: 5.0000 mg | ORAL_TABLET | Freq: Three times a day (TID) | ORAL | Status: DC | PRN

## 2018-09-30 MED ORDER — TETRACAINE HCL 1 % IJ SOLN
INTRAMUSCULAR | Status: DC | PRN
  Administered 2018-09-30: 5 mg via INTRASPINAL

## 2018-09-30 MED ORDER — EPHEDRINE SULFATE 50 MG/ML IJ SOLN
INTRAMUSCULAR | Status: AC
  Filled 2018-09-30: qty 1

## 2018-09-30 MED ORDER — OXYCODONE HCL 5 MG PO TABS: 5.0000 mg | ORAL_TABLET | Freq: Once | ORAL | Status: DC | PRN

## 2018-09-30 MED ORDER — ACETAMINOPHEN 325 MG PO TABS
325.0000 mg | ORAL_TABLET | Freq: Four times a day (QID) | ORAL | Status: DC | PRN
  Administered 2018-10-01 – 2018-10-10 (×6): 650 mg via ORAL
  Filled 2018-09-30 (×7): qty 2

## 2018-09-30 MED ORDER — SODIUM CHLORIDE 0.9 % IV SOLN
1.0000 g | Freq: Two times a day (BID) | INTRAVENOUS | Status: AC
  Administered 2018-09-30 – 2018-10-04 (×9): 1 g via INTRAVENOUS
  Filled 2018-09-30 (×14): qty 1

## 2018-09-30 MED ORDER — PROPOFOL 500 MG/50ML IV EMUL
INTRAVENOUS | Status: AC
  Filled 2018-09-30: qty 50

## 2018-09-30 MED ORDER — METOCLOPRAMIDE HCL 10 MG PO TABS
10.0000 mg | ORAL_TABLET | Freq: Three times a day (TID) | ORAL | Status: AC
  Administered 2018-09-30 – 2018-10-02 (×7): 10 mg via ORAL
  Filled 2018-09-30 (×8): qty 1

## 2018-09-30 SURGICAL SUPPLY — 38 items
BIT DRILL CALIBRATED 4.2 (BIT) ×1 IMPLANT
BLADE TFNA HELICAL 90 STERILE (Anchor) ×2 IMPLANT
CANISTER SUCT 1200ML W/VALVE (MISCELLANEOUS) ×2 IMPLANT
COVER WAND RF STERILE (DRAPES) ×2 IMPLANT
DRAPE 3/4 80X56 (DRAPES) ×2 IMPLANT
DRAPE C-ARMOR (DRAPES) IMPLANT
DRAPE U-SHAPE 47X51 STRL (DRAPES) IMPLANT
DRILL BIT CALIBRATED 4.2 (BIT) ×2
DRSG DERMACEA 8X12 NADH (GAUZE/BANDAGES/DRESSINGS) ×2 IMPLANT
DRSG OPSITE POSTOP 3X4 (GAUZE/BANDAGES/DRESSINGS) IMPLANT
DRSG OPSITE POSTOP 4X12 (GAUZE/BANDAGES/DRESSINGS) ×2 IMPLANT
DURAPREP 26ML APPLICATOR (WOUND CARE) ×2 IMPLANT
ELECT REM PT RETURN 9FT ADLT (ELECTROSURGICAL) ×2
ELECTRODE REM PT RTRN 9FT ADLT (ELECTROSURGICAL) ×1 IMPLANT
GLOVE BIOGEL M STRL SZ7.5 (GLOVE) ×8 IMPLANT
GLOVE INDICATOR 8.0 STRL GRN (GLOVE) ×2 IMPLANT
GOWN STRL REUS W/ TWL LRG LVL3 (GOWN DISPOSABLE) ×2 IMPLANT
GOWN STRL REUS W/TWL LRG LVL3 (GOWN DISPOSABLE) ×2
GUIDEWIRE 3.2X400 (WIRE) ×4 IMPLANT
IMPL DEG TI CANN 11MM/130 (Orthopedic Implant) ×1 IMPLANT
IMPLANT DEG TI CANN 11MM/130 (Orthopedic Implant) ×2 IMPLANT
KIT TURNOVER CYSTO (KITS) ×2 IMPLANT
MAT ABSORB  FLUID 56X50 GRAY (MISCELLANEOUS) ×1
MAT ABSORB FLUID 56X50 GRAY (MISCELLANEOUS) ×1 IMPLANT
NS IRRIG 1000ML POUR BTL (IV SOLUTION) ×2 IMPLANT
PACK HIP COMPR (MISCELLANEOUS) ×2 IMPLANT
REAMER ROD DEEP FLUTE 2.5X950 (INSTRUMENTS) IMPLANT
SCREW LOCK T25 FT 36X5X4.3X (Screw) ×1 IMPLANT
SCREW LOCKING 5.0X36MM (Screw) ×1 IMPLANT
SOL PREP PVP 2OZ (MISCELLANEOUS) ×2
SOLUTION PREP PVP 2OZ (MISCELLANEOUS) ×1 IMPLANT
STAPLER SKIN PROX 35W (STAPLE) ×2 IMPLANT
SUCTION FRAZIER HANDLE 10FR (MISCELLANEOUS) ×1
SUCTION TUBE FRAZIER 10FR DISP (MISCELLANEOUS) ×1 IMPLANT
SUT VIC AB 0 CT1 36 (SUTURE) ×2 IMPLANT
SUT VIC AB 1 CT1 36 (SUTURE) ×2 IMPLANT
SUT VIC AB 2-0 CT1 27 (SUTURE) ×1
SUT VIC AB 2-0 CT1 TAPERPNT 27 (SUTURE) ×1 IMPLANT

## 2018-09-30 NOTE — Progress Notes (Signed)
[name] NAME: Ana Washington    MR#:  [id number]  DATE OF BIRTH:  1945/04/23  SUBJECTIVE:  Patient going for OR today.  Had fever last night.  No fevers this morning.  REVIEW OF SYSTEMS:    Review of Systems  Constitutional: Negative for fever, chills weight loss HENT: Negative for ear pain, nosebleeds, congestion, facial swelling, rhinorrhea, neck pain, neck stiffness and ear discharge.   Respiratory: Negative for cough, shortness of breath, wheezing  Cardiovascular: Negative for chest pain, palpitations and leg swelling.  Gastrointestinal: Negative for heartburn, abdominal pain, vomiting, diarrhea or consitpation Genitourinary: Negative for dysuria, urgency, frequency, hematuria Musculoskeletal: Negative for back pain or ++RIGHT HIP PAIN Neurological: Negative for dizziness, seizures, syncope, focal weakness,  numbness and headaches.  Hematological: Does not bruise/bleed easily.  Psychiatric/Behavioral: Negative for hallucinations, confusion, dysphoric mood    Tolerating Diet: npo      DRUG ALLERGIES:   Allergies  Allergen Reactions  . Sulfa Antibiotics Diarrhea  . Codeine Nausea And Vomiting  . Penicillins Rash    Has patient had a PCN reaction causing immediate rash, facial/tongue/throat swelling, SOB or lightheadedness with hypotension: Unknown Has patient had a PCN reaction causing severe rash involving mucus membranes or skin necrosis: Unknown Has patient had a PCN reaction that required hospitalization: Unknown Has patient had a PCN reaction occurring within the last 10 years: No If all of the above answers are "NO", then may proceed with Cephalosporin use.     VITALS:  Blood pressure 110/62, pulse 82, temperature (!) 97.5 F (36.4 C), resp. rate (!) 22, height _0  (1.651 m), weight 47.6 kg, SpO2 91 %.  PHYSICAL EXAMINATION:  Constitutional: Appears well-developed and well-nourished. No distress. HENT:  Normocephalic. [name] Oropharynx is clear and moist.  Eyes: Conjunctivae and EOM are normal. PERRLA, no scleral icterus.  Neck: Normal ROM. Neck supple. No JVD. No tracheal deviation. CVS: RRR, S1/S2 +, no murmurs, no gallops, no carotid bruit.  Pulmonary: Effort and breath sounds normal, no stridor, rhonchi, wheezes, rales.  Abdominal: Soft. BS +,  no distension, tenderness, rebound or guarding.  Musculoskeletal: right leg shorter slight rotation. No edema and no tenderness.  Neuro: Alert. CN 2-12 grossly intact. No focal deficits. Skin: Skin is warm and dry. No rash noted. Psychiatric: Normal mood and affect.      LABORATORY PANEL:   CBC Recent Labs  Lab 09/30/18 0421  WBC 15.3*  HGB 10.7*  HCT 32.7*  PLT 223   ------------------------------------------------------------------------------------------------------------------  Chemistries  Recent Labs  Lab 09/27/18 1309 09/27/18 1508  09/30/18 0421  NA 141  --    < > 133*  K 3.0*  --    < > 4.0  CL 93*  --    < > 89*  CO2 35*  --    < > 35*  GLUCOSE 118*  --    < > 92  BUN 14  --    < > 21  CREATININE 0.58  --    < > 0.67  CALCIUM 9.3  --    < > 9.2  MG  --  1.6*  --   --   AST 30  --   --   --   ALT 20  --   --   --   ALKPHOS 49  --   --   --   BILITOT 0.7  --   --   --    < > =  values in this interval not displayed.   ------------------------------------------------------------------------------------------------------------------  Cardiac Enzymes No results for input(s): TROPONINI in the last 168 hours. ------------------------------------------------------------------------------------------------------------------  RADIOLOGY:  Dg Chest 1 View  Result Date: 09/30/2018 CLINICAL DATA:  Hip fracture.  Preoperative exam. EXAM: CHEST  1 VIEW COMPARISON:  09/27/2018.  CT 09/28/2018. FINDINGS: Heart size is stable. Previous pulmonary resection in the right midlung. Widespread bilateral pulmonary opacity persists,  shown by CT to be secondary to widespread bronchiectasis and alveolar opacities. No radiographic change. Multiple old vertebral augmentations. IMPRESSION: No significant radiographic change. Extensive widespread bilateral pulmonary opacities as above and discussed completely by chest CT yesterday. Electronically Signed   By: Nelson Chimes M.D.   On: 09/30/2018 08:02   Ct Chest W Contrast  Result Date: 09/28/2018 CLINICAL DATA:  Inpatient. Dyspnea. Abnormal chest radiograph with question new suprahilar opacity on the right. Clinical concern for interstitial lung disease. EXAM: CT CHEST WITH CONTRAST TECHNIQUE: Multidetector CT imaging of the chest was performed during intravenous contrast administration. CONTRAST:  61m OMNIPAQUE IOHEXOL 300 MG/ML  SOLN COMPARISON:  Chest radiograph from one day prior. 04/08/2018 chest CT angiogram. FINDINGS: Cardiovascular: Top-normal heart size. No significant pericardial effusion/thickening. Left anterior descending coronary atherosclerosis. Atherosclerotic nonaneurysmal thoracic aorta. Normal caliber main pulmonary artery. No central pulmonary emboli. Mediastinum/Nodes: No discrete thyroid nodules. Unremarkable esophagus. No axillary adenopathy. Mildly enlarged 1.3 cm low right paratracheal lymph node (series 2/image 56) and mildly enlarged 1.4 cm subcarinal node (series 2/image 67), both stable since 04/08/2018. No new pathologically enlarged mediastinal nodes. No hilar adenopathy. Lungs/Pleura: Status post left lower and right middle lobectomies. No pneumothorax. Trace dependent left pleural effusion. No right pleural effusion. There is diffuse moderate cylindrical and varicoid bronchiectasis throughout both lungs with associated diffuse bronchial wall thickening. No acute consolidative airspace disease or lung masses. Mild patchy tree-in-bud opacity and scattered mucoid impaction associated with the areas of bronchiectasis, substantially decreased in the interval. Extensive  parenchymal bands throughout the periphery of both lungs compatible with postinfectious/postinflammatory scarring, unchanged. No significant pulmonary nodules. Mild interlobular septal thickening and mosaic attenuation throughout both lungs, similar to prior. Upper abdomen: Cholecystectomy. Stable moderate intrahepatic biliary ductal dilatation. Simple 3.7 cm lateral left renal cyst. Musculoskeletal: No aggressive appearing focal osseous lesions. Multilevel thoracolumbar vertebral compression fractures from T5 through L3 with associated vertebroplasty material at each level. IMPRESSION: 1. Diffuse cylindrical and varicoid bronchiectasis in both lungs with associated bandlike scarring throughout the periphery of both lungs. Mild patchy tree-in-bud opacities and scattered mucoid impaction is significantly improved since 04/08/2018 chest CT. 2. Prior left lower and right middle lobectomy. 3. Chronic mosaic attenuation in both lungs is probably due to air trapping. 4. Mild mediastinal lymphadenopathy is stable and probably reactive. 5. One vessel coronary atherosclerosis. Aortic Atherosclerosis (ICD10-I70.0). Electronically Signed   By: JIlona SorrelM.D.   On: 09/28/2018 16:53     ASSESSMENT AND PLAN:    73year old female with chronic hypoxic respiratory failure on 2 L of oxygen and CAD who presented to the emergency room due to mechanical fall and right sided hip pain.  1.  Nondisplaced right intertrochanteric fracture: Plan to go for surgery this a.m. for ORIF.  Cleared by pulmonologist and cardiologist.  Patient at intermediate risk for intermediate risk procedure. DVT prophylaxis as per orthopedic surgery.  2.  Acute on chronic hypoxic respiratory failure with history of bronchiectasis/MAC and left lower lobe resection: Pulmonary consult appreciated Continue chest physiotherapy Continue azithromycin and ciprofloxacin for prophylaxis due to history of chronic Mycobacterium  AVM infection. WBC may be  due to steroids. No evidence of pneumonia or COPD exacerbation as per pulmonary.   3.  CAD: Continue metoprolol  4.  Essential hypertension: Continue diltiazem and metoprolol Holding Norvasc and losartan due to normal blood pressure.    5.  PAF: Patient currently in normal sinus rhythm Holding anticoagulation for planned surgery today Resume in am  Continue diltiazem and metoprolol for heart rate control.  6.  ESBL UTI: Patient had fever last night likely due to ESBL UTI  Start Zosyn as per sensitivities    management plans discussed with the patient and she is in agreement.  CODE STATUS: dnr  TOTAL TIME TAKING CARE OF THIS PATIENT: 25 minutes.     POSSIBLE D/C 2 days, DEPENDING ON CLINICAL CONDITION.   Bettey Costa M.D on 09/30/2018 at 11:18 AM  Between 7am to 6pm - Pager - [phone] After 6pm go to [email or web address] - password [email or web address]  Office  [phone]  CC: Primary care physician; [name], MD  Note: This dictation was prepared with Dragon dictation along with smaller phrase technology. Any transcriptional errors that result from this process are unintentional.

## 2018-09-30 NOTE — Progress Notes (Signed)
Pt progressing well since surgery. Pain is controlled at this time. Has full sensation to both lower extremities. Surgical site clean and intact. RT and RN collaborating to control oxygenation. Continuous pulse ox applied. RN weaned pt to 4L O2 by nasal cannula by shift end with pt sating at 97%.   Family called to inform RN that pt visitor on Friday (pt sister) tested covid positive today and will be transferred to [facility]. MD paged and pt swabbed for covid. Covid test is negative.

## 2018-09-30 NOTE — Op Note (Signed)
OPERATIVE NOTE  DATE OF SURGERY:  09/27/2018 - 09/30/2018  PATIENT NAME:  Ana Washington   DOB: 1945/09/11  MRN: [id number]  PRE-OPERATIVE DIAGNOSIS: Right intertrochanteric femur fracture  POST-OPERATIVE DIAGNOSIS:  Same  PROCEDURE: Open reduction and internal fixation of a right intertrochanteric femur fracture   SURGEON:  Marciano Sequin. M.D.  ANESTHESIA: spinal  ESTIMATED BLOOD LOSS: 50 mL  FLUIDS REPLACED: 1000 mL of crystalloid  DRAINS: None  IMPLANTS UTILIZED: Synthes 11 mm/130 trochanteric fixation nail, 90 mm helical blade, 36 mm x 5.0 mm locking screw  INDICATIONS FOR SURGERY: Ana Washington is a 73 y.o. year old female who fell and sustained a displaced right intertrochanteric femur fracture. After discussion of the risks and benefits of surgical intervention, the patient expressed understanding of the risks benefits and agree with plans for open reduction and internal fixation.   The risks, benefits, and alternatives were discussed at length including but not limited to the risks of infection, bleeding, nerve injury, stiffness, blood clots, the need for revision surgery, limb length inequality, cardiopulmonary complications, among others, and they were willing to proceed.  PROCEDURE IN DETAIL: The patient was brought into the operating room and, after adequate spinal anesthesia was achieved, patient was placed on the fracture table. All bony prominences were well-padded. The right lower extremity was placed in traction and a provisional reduction was performed and verified using the C-arm. The patient's right hip and leg were cleaned and prepped with alcohol and DuraPrep and draped in the usual sterile fashion. A "timeout" was performed as per usual protocol. A lateral incision was made extended from the proximal portion of the greater trochanter proximally. The fascia was incised in line with the skin incision and the fibers of the hip abductors were split in line. The  tip of the greater trochanter was palpated and a distally threaded guide pin was inserted into the tip of the greater trochanter and advanced into the medullary canal. Position was confirmed in both AP and lateral planes using the C-arm. A pilot hole was enlarged using a step drill. A Synthes 11 mm/130 trochanteric fixation nail was advanced over the guidepin and position confirmed using the C-arm. A second stab incision was made and the tissue protector was inserted through the outrigger device and advanced to the lateral cortex of the femur. A threaded screw guide pin was inserted into the femoral neck and head and position was again confirmed in both AP and lateral planes. Measurements were obtained and it was felt that a 90 mm helical blade was appropriate. The cortex was reamed and then a cannulated reamer was advanced over the guidepin to the appropriate depth. A 90 mm helical blade was then advanced over the guidepin and impacted into place. Good position was noted in multiple planes using the C-arm. The locking sleeve was engaged. Finally, a third stab incision was made and the tissue protector was inserted through the outrigger device and advanced the lateral cortex of the femur for placement of the distal locking screw. A 36 mm x 5.0 mm locking screw was then inserted. The outrigger device was removed. The hip was visualized in all planes using the C-arm with good reduction appreciated and good position of the hardware noted.  The wound was irrigated with copious amounts of normal saline with antibiotic solution and suctioned dry. Good hemostasis was appreciated. The fascia was reapproximated using interrupted sutures of #1 Vicryl. Subcutaneous tissue was approximated layers using first #0 Vicryl followed #2-0  Vicryl. The skin was closed with skin staples. A sterile dressing was applied.  The patient tolerated the procedure well and was transported to the recovery room in stable condition.   Marciano Sequin., M.D.

## 2018-09-30 NOTE — Anesthesia Procedure Notes (Addendum)
Spinal  Patient location during procedure: OR Start time: 09/30/2018 8:10 AM End time: 09/30/2018 8:20 AM Staffing Anesthesiologist: Piscitello, Precious Haws, MD Resident/CRNA: Mina Marble D, CRNA Performed: resident/CRNA  Preanesthetic Checklist Completed: patient identified, site marked, surgical consent, pre-op evaluation, timeout performed, IV checked, risks and benefits discussed and monitors and equipment checked Spinal Block Patient position: sitting Prep: ChloraPrep and site prepped and draped Patient monitoring: heart rate, continuous pulse ox and blood pressure Approach: midline Location: L3-4 Injection technique: single-shot Needle Needle type: Pencan  Needle gauge: 24 G Needle length: 10 cm Assessment Sensory level: T10 Additional Notes No complications, Pt tolerated well

## 2018-09-30 NOTE — Progress Notes (Signed)
15 minute call to floor. 

## 2018-09-30 NOTE — Progress Notes (Signed)
SUBJECTIVE: Patient in surgery apparently has noticed chest pain    Vitals:   09/29/18 2100 09/29/18 2247 09/30/18 0002 09/30/18 0218  BP:  (!) 146/85    Pulse:  95    Resp:  20    Temp:  (!) 101.1 F (38.4 C) (!) 101 F (38.3 C) 99.6 F (37.6 C)  TempSrc:  Oral Oral Oral  SpO2: 95% 92%    Weight:      Height:        Intake/Output Summary (Last 24 hours) at 09/30/2018 0955 Last data filed at 09/30/2018 0950 Gross per 24 hour  Intake 1000 ml  Output 1750 ml  Net -750 ml    LABS: Basic Metabolic Panel: Recent Labs    09/27/18 1508  09/29/18 0415 09/30/18 0421  NA  --    < > 136 133*  K  --    < > 4.6 4.0  CL  --    < > 94* 89*  CO2  --    < > 35* 35*  GLUCOSE  --    < > 94 92  BUN  --    < > 21 21  CREATININE  --    < > 0.67 0.67  CALCIUM  --    < > 8.8* 9.2  MG 1.6*  --   --   --    < > = values in this interval not displayed.   Liver Function Tests: Recent Labs    09/27/18 1309  AST 30  ALT 20  ALKPHOS 49  BILITOT 0.7  PROT 6.6  ALBUMIN 3.5   No results for input(s): LIPASE, AMYLASE in the last 72 hours. CBC: Recent Labs    09/27/18 1309  09/28/18 1440 09/30/18 0421  WBC 18.7*   < > 23.1* 15.3*  NEUTROABS 14.6*  --  20.5*  --   HGB 12.9   < > 11.5* 10.7*  HCT 38.3   < > 34.9* 32.7*  MCV 101.3*   < > 102.0* 103.8*  PLT 318   < > 289 223   < > = values in this interval not displayed.   Cardiac Enzymes: No results for input(s): CKTOTAL, CKMB, CKMBINDEX, TROPONINI in the last 72 hours. BNP: Invalid input(s): POCBNP D-Dimer: No results for input(s): DDIMER in the last 72 hours. Hemoglobin A1C: No results for input(s): HGBA1C in the last 72 hours. Fasting Lipid Panel: No results for input(s): CHOL, HDL, LDLCALC, TRIG, CHOLHDL, LDLDIRECT in the last 72 hours. Thyroid Function Tests: No results for input(s): TSH, T4TOTAL, T3FREE, THYROIDAB in the last 72 hours.  Invalid input(s): FREET3 Anemia Panel: No results for input(s): VITAMINB12,  FOLATE, FERRITIN, TIBC, IRON, RETICCTPCT in the last 72 hours.   PHYSICAL EXAM General: Well developed, well nourished, in no acute distress HEENT:  Normocephalic and atramatic Neck:  No JVD.  Lungs: Clear bilaterally to auscultation and percussion. Heart: HRRR . Normal S1 and S2 without gallops or murmurs.  Abdomen: Bowel sounds are positive, abdomen soft and non-tender  Msk:  Back normal, normal gait. Normal strength and tone for age. Extremities: No clubbing, cyanosis or edema.   Neuro: Alert and oriented X 3. Psych:  Good affect, responds appropriately  TELEMETRY: Sinus rhythm  ASSESSMENT AND PLAN: Patient having surgery today.  Clinically stable prior to surgery.  Active Problems:   Closed right hip fracture (HCC)    Neoma Laming A, MD, [facility] 09/30/2018 9:55 AM

## 2018-09-30 NOTE — Anesthesia Post-op Follow-up Note (Signed)
Anesthesia QCDR form completed.        

## 2018-09-30 NOTE — Transfer of Care (Signed)
Immediate Anesthesia Transfer of Care Note  Patient: Ana Washington  Procedure(s) Performed: INTRAMEDULLARY (IM) NAIL INTERTROCHANTRIC (Right )  Patient Location: PACU  Anesthesia Type:Spinal  Level of Consciousness: awake and alert   Airway & Oxygen Therapy: Patient Spontanous Breathing and Patient connected to face mask oxygen  Post-op Assessment: Report given to RN and Post -op Vital signs reviewed and stable  Post vital signs: Reviewed and stable  Last Vitals:  Vitals Value Taken Time  BP    Temp    Pulse    Resp    SpO2      Last Pain:  Vitals:   09/30/18 0218  TempSrc: Oral  PainSc:       Patients Stated Pain Goal: 3 ([id number])  Complications: No apparent anesthesia complications

## 2018-09-30 NOTE — Consult Note (Signed)
Pharmacy Antibiotic Note  Ana Washington is a 73 y.o. female admitted on 09/27/2018 with pneumonia and is now growing ESBL in her urine.  Pharmacy has been consulted for meropenem dosing. Patient is also receiving azithromycin and ciprofloxacin currently for prophylaxis due to history of chronic Mycobacterium AVM infection .  Plan:   start meropenem 1 gram IV every 12 hours  Height: 5\' 5"  (165.1 cm) Weight: 105 lb (47.6 kg) IBW/kg (Calculated) : 57  Temp (24hrs), Avg:99.1 F (37.3 C), Min:97.5 F (36.4 C), Max:101.1 F (38.4 C)  Recent Labs  Lab 09/27/18 1309 09/28/18 0446 09/28/18 1440 09/29/18 0415 09/30/18 0421  WBC 18.7* 15.4* 23.1*  --  15.3*  CREATININE 0.58 0.65  --  0.67 0.67    Estimated Creatinine Clearance: 46.4 mL/min (by C-G formula based on SCr of 0.67 mg/dL).    Allergies  Allergen Reactions  . Sulfa Antibiotics Diarrhea  . Codeine Nausea And Vomiting  . Penicillins Rash    Has patient had a PCN reaction causing immediate rash, facial/tongue/throat swelling, SOB or lightheadedness with hypotension: Unknown Has patient had a PCN reaction causing severe rash involving mucus membranes or skin necrosis: Unknown Has patient had a PCN reaction that required hospitalization: Unknown Has patient had a PCN reaction occurring within the last 10 years: No If all of the above answers are "NO", then may proceed with Cephalosporin use.     Antimicrobials this admission: ceftriaxone 8/27 >> 8/28 azithromycin 8/27 >>  ciprofloxacin 8/28 >> meropenem 8/30 >>  Thank you for allowing pharmacy to be a part of this patient's care.  Dallie Piles, PharmD 09/30/2018 11:43 AM

## 2018-09-30 NOTE — Progress Notes (Signed)
Pulmonary Medicine          Date: 74/30/2020,   MRN# [id number] KIYO HEAL 74-Dec-1947     AdmissionWeight: 47.6 kg                 CurrentWeight: 47.6 kg      CHIEF COMPLAINT:   Acute on chronic respiratory failure   SUBJECTIVE    Patient s/p hip repair surgery.  She has an increased O2 requirement with shallow breathing post anesthesia.   Plan to perform recruitment maunevers with VEST therapy and metaneb with saline if patient is able to participate. Will administer short course of solumedrol for poss acute exacerbation of severe bronchiectasis.    PAST MEDICAL HISTORY   Past Medical History:  Diagnosis Date   Arthritis    Atrial fibrillation ([place])    Bronchiectasis ([place])    CAD (coronary artery disease)    CAD (coronary artery disease) 07/30/2017   Dumping syndrome    Dyspnea    Dysrhythmia    afib   Essential hypertension, malignant 10/03/2013   Family history of adverse reaction to anesthesia    sister [name]   GERD (gastroesophageal reflux disease)    Headache    MIGRAINES   Hypertension    Lung disease    Myocardial infarction ([place]) 2007   Non-STEMI   [name] (postoperative nausea and vomiting)    Psoriasis    PUD (peptic ulcer disease)      SURGICAL HISTORY   Past Surgical History:  Procedure Laterality Date   BACK SURGERY     CHOLECYSTECTOMY     EYE SURGERY     FOOT SURGERY     KYPHOPLASTY N/A 07/05/2016   Procedure: KYPHOPLASTY T - 9;  Surgeon: Hessie Knows, MD;  Location: [facility];  Service: Orthopedics;  Laterality: N/A;   KYPHOPLASTY N/A 11/29/2017   Procedure: Iona Hansen;  Surgeon: Hessie Knows, MD;  Location: [facility];  Service: Orthopedics;  Laterality: N/A;  L2 and L3   KYPHOPLASTY N/A 12/18/2017   Procedure: KYPHOPLASTY L1;  Surgeon: Hessie Knows, MD;  Location: [facility];  Service: Orthopedics;  Laterality: N/A;   KYPHOPLASTY N/A 01/05/2018   Procedure: KYPHOPLASTY-T11,T12;  Surgeon:  Hessie Knows, MD;  Location: [facility];  Service: Orthopedics;  Laterality: N/A;   KYPHOPLASTY N/A 04/05/2018   Procedure: T10 KYPHOPLASTY;  Surgeon: Hessie Knows, MD;  Location: [facility];  Service: Orthopedics;  Laterality: N/A;   KYPHOPLASTY N/A 04/12/2018   Procedure: KYPHOPLASTY T7,8;  Surgeon: Hessie Knows, MD;  Location: [facility];  Service: Orthopedics;  Laterality: N/A;   KYPHOPLASTY N/A 04/19/2018   Procedure: KYPHOPLASTY T5, T6;  Surgeon: Hessie Knows, MD;  Location: [facility];  Service: Orthopedics;  Laterality: N/A;   LUNG SURGERY  1990 and 1996   THOROCOTOMY WITH LOBECTOMY     LEFT LOWER THORACOTOMY / RIGHT MIDDLE LOBECTOMY     FAMILY HISTORY   Family History  Problem Relation Age of Onset   Hypertension Mother    Hypertension Father      SOCIAL HISTORY   Social History   Tobacco Use   Smoking status: Never Smoker   Smokeless tobacco: Never Used  Substance Use Topics   Alcohol use: No   Drug use: No     MEDICATIONS    Home Medication:    Current Medication:  Current Facility-Administered Medications:    acetaminophen (OFIRMEV) IV 1,000 mg, 1,000 mg, Intravenous, Q6H, Hooten, Laurice Record, MD, Last Rate: 400 mL/hr at 09/30/18  1031, 1,000 mg at 09/30/18 1031   acetaminophen (TYLENOL) tablet 650 mg, 650 mg, Oral, Q6H PRN, 650 mg at 09/29/18 2253 **OR** acetaminophen (TYLENOL) suppository 650 mg, 650 mg, Rectal, Q6H PRN, Demetrios Loll, MD   acetylcysteine (MUCOMYST) 20 % nebulizer / oral solution 4 mL, 4 mL, Nebulization, BID, Lanney Gins, Jahmeek Shirk, MD, 4 mL at 09/29/18 2032   acidophilus (RISAQUAD) capsule 1 capsule, 1 capsule, Oral, Daily, Demetrios Loll, MD, 1 capsule at 09/29/18 1004   albuterol (PROVENTIL) (2.5 MG/3ML) 0.083% nebulizer solution 2.5 mg, 2.5 mg, Nebulization, Q2H PRN, Demetrios Loll, MD   azithromycin [facility]) tablet 250 mg, 250 mg, Oral, Daily, Ottie Glazier, MD, 250 mg at 09/29/18 3716   bisacodyl (DULCOLAX) EC tablet 5 mg, 5 mg, Oral,  Daily PRN, Demetrios Loll, MD, 5 mg at 09/29/18 9678   calcium-vitamin D (OSCAL WITH D) 500-200 MG-UNIT per tablet 1 tablet, 1 tablet, Oral, Daily, Demetrios Loll, MD, 1 tablet at 09/29/18 9381   chlorpheniramine-HYDROcodone (TUSSIONEX) 10-8 MG/5ML suspension 5 mL, 5 mL, Oral, QHS, Demetrios Loll, MD, 5 mL at 09/29/18 2117   ciprofloxacin (CIPRO) tablet 500 mg, 500 mg, Oral, BID, Ottie Glazier, MD, 500 mg at 09/29/18 2117   diltiazem (CARDIZEM CD) 24 hr capsule 120 mg, 120 mg, Oral, Daily, Sudini, Srikar, MD, 120 mg at 09/29/18 1003   feeding supplement (ENSURE ENLIVE) (ENSURE ENLIVE) liquid 237 mL, 237 mL, Oral, BID BM, Sudini, Srikar, MD, 237 mL at 09/29/18 1019   ferrous sulfate tablet 325 mg, 325 mg, Oral, Daily, Demetrios Loll, MD, 325 mg at 09/29/18 0175   gabapentin (NEURONTIN) capsule 300 mg, 300 mg, Oral, QID, Demetrios Loll, MD, 300 mg at 09/29/18 2117   guaiFENesin-dextromethorphan (ROBITUSSIN DM) 100-10 MG/5ML syrup 5 mL, 5 mL, Oral, Q4H PRN, Demetrios Loll, MD   HYDROcodone-acetaminophen ([place]) 7.5-325 MG per tablet 1 tablet, 1 tablet, Oral, Q4H PRN, Hooten, Laurice Record, MD, 1 tablet at 09/29/18 1457   ipratropium-albuterol (DUONEB) 0.5-2.5 (3) MG/3ML nebulizer solution 3 mL, 3 mL, Nebulization, QID, Mody, Sital, MD, 3 mL at 09/30/18 1133   LORazepam (ATIVAN) tablet 0.5 mg, 0.5 mg, Oral, Q4H, Demetrios Loll, MD, 0.5 mg at 09/29/18 1005   metoprolol succinate (TOPROL-XL) 24 hr tablet 50 mg, 50 mg, Oral, QHS, Demetrios Loll, MD, 50 mg at 09/29/18 2117   morphine 2 MG/ML injection 1 mg, 1 mg, Intravenous, Q2H PRN, Hooten, Laurice Record, MD, 1 mg at 09/29/18 1014   multivitamin with minerals tablet 1 tablet, 1 tablet, Oral, Daily, Sudini, Alveta Heimlich, MD, 1 tablet at 09/29/18 0953   ondansetron (ZOFRAN) 4 MG/2ML injection, , , ,    ondansetron (ZOFRAN) tablet 4 mg, 4 mg, Oral, Q6H PRN, 4 mg at 09/29/18 1457 **OR** ondansetron (ZOFRAN) injection 4 mg, 4 mg, Intravenous, Q6H PRN, Demetrios Loll, MD   pantoprazole  (PROTONIX) EC tablet 40 mg, 40 mg, Oral, Daily, Demetrios Loll, MD, 40 mg at 09/29/18 1025   senna-docusate (Senokot-S) tablet 1 tablet, 1 tablet, Oral, QHS PRN, Demetrios Loll, MD   sucralfate (CARAFATE) tablet 1 g, 1 g, Oral, QID, Demetrios Loll, MD, 1 g at 09/29/18 2117   traMADol (ULTRAM) tablet 50 mg, 50 mg, Oral, Q6H PRN, Demetrios Loll, MD, 50 mg at 09/29/18 1327   vitamin C (ASCORBIC ACID) tablet 1,000 mg, 1,000 mg, Oral, Daily, Demetrios Loll, MD, 1,000 mg at 09/29/18 8527    ALLERGIES   Sulfa antibiotics, Codeine, and Penicillins     REVIEW OF SYSTEMS    Review of Systems:  Gen:  Denies  fever, sweats, chills weigh loss  HEENT: Denies blurred vision, double vision, ear pain, eye pain, hearing loss, nose bleeds, sore throat Cardiac:  No dizziness, chest pain or heaviness, chest tightness,edema Resp:   Denies cough or sputum porduction, shortness of breath,wheezing, hemoptysis,  Gi: Denies swallowing difficulty, stomach pain, nausea or vomiting, diarrhea, constipation, bowel incontinence Gu:  Denies bladder incontinence, burning urine Ext:   Denies Joint pain, stiffness or swelling Skin: Denies  skin rash, easy bruising or bleeding or hives Endoc:  Denies polyuria, polydipsia , polyphagia or weight change Psych:   Denies depression, insomnia or hallucinations   Other:  All other systems negative   VS: BP 113/79 (BP Location: Right Arm)    Pulse 88    Temp 97.7 F (36.5 C) (Oral)    Resp (!) 21    Ht _0  (1.651 m)    Wt 47.6 kg    SpO2 99%    BMI 17.47 kg/m      PHYSICAL EXAM    GENERAL:NAD, no fevers, chills, no weakness no fatigue HEAD: Normocephalic, atraumatic.  EYES: Pupils equal, round, reactive to light. Extraocular muscles intact. No scleral icterus.  MOUTH: Moist mucosal membrane. Dentition intact. No abscess noted.  EAR, NOSE, THROAT: Clear without exudates. No external lesions.  NECK: Supple. No thyromegaly. No nodules. No JVD.  PULMONARY:mild rhonchi bilaterally    CARDIOVASCULAR: S1 and S2. Regular rate and rhythm. No murmurs, rubs, or gallops. No edema. Pedal pulses 2+ bilaterally.  GASTROINTESTINAL: Soft, nontender, nondistended. No masses. Positive bowel sounds. No hepatosplenomegaly.  MUSCULOSKELETAL: No swelling, clubbing, or edema. Range of motion full in all extremities.  NEUROLOGIC: Cranial nerves II through XII are intact. No gross focal neurological deficits. Sensation intact. Reflexes intact.  SKIN: No ulceration, lesions, rashes, or cyanosis. Skin warm and dry. Turgor intact.  PSYCHIATRIC: Mood, affect within normal limits. The patient is awake, alert and oriented x 3. Insight, judgment intact.       IMAGING    Dg Chest 1 View  Result Date: 09/30/2018 CLINICAL DATA:  Hip fracture.  Preoperative exam. EXAM: CHEST  1 VIEW COMPARISON:  09/27/2018.  CT 09/28/2018. FINDINGS: Heart size is stable. Previous pulmonary resection in the right midlung. Widespread bilateral pulmonary opacity persists, shown by CT to be secondary to widespread bronchiectasis and alveolar opacities. No radiographic change. Multiple old vertebral augmentations. IMPRESSION: No significant radiographic change. Extensive widespread bilateral pulmonary opacities as above and discussed completely by chest CT yesterday. Electronically Signed   By: Nelson Chimes M.D.   On: 09/30/2018 08:02   Ct Chest W Contrast  Result Date: 09/28/2018 CLINICAL DATA:  Inpatient. Dyspnea. Abnormal chest radiograph with question new suprahilar opacity on the right. Clinical concern for interstitial lung disease. EXAM: CT CHEST WITH CONTRAST TECHNIQUE: Multidetector CT imaging of the chest was performed during intravenous contrast administration. CONTRAST:  82m OMNIPAQUE IOHEXOL 300 MG/ML  SOLN COMPARISON:  Chest radiograph from one day prior. 04/08/2018 chest CT angiogram. FINDINGS: Cardiovascular: Top-normal heart size. No significant pericardial effusion/thickening. Left anterior descending  coronary atherosclerosis. Atherosclerotic nonaneurysmal thoracic aorta. Normal caliber main pulmonary artery. No central pulmonary emboli. Mediastinum/Nodes: No discrete thyroid nodules. Unremarkable esophagus. No axillary adenopathy. Mildly enlarged 1.3 cm low right paratracheal lymph node (series 2/image 56) and mildly enlarged 1.4 cm subcarinal node (series 2/image 67), both stable since 04/08/2018. No new pathologically enlarged mediastinal nodes. No hilar adenopathy. Lungs/Pleura: Status post left lower and right middle lobectomies. No pneumothorax. Trace dependent left  pleural effusion. No right pleural effusion. There is diffuse moderate cylindrical and varicoid bronchiectasis throughout both lungs with associated diffuse bronchial wall thickening. No acute consolidative airspace disease or lung masses. Mild patchy tree-in-bud opacity and scattered mucoid impaction associated with the areas of bronchiectasis, substantially decreased in the interval. Extensive parenchymal bands throughout the periphery of both lungs compatible with postinfectious/postinflammatory scarring, unchanged. No significant pulmonary nodules. Mild interlobular septal thickening and mosaic attenuation throughout both lungs, similar to prior. Upper abdomen: Cholecystectomy. Stable moderate intrahepatic biliary ductal dilatation. Simple 3.7 cm lateral left renal cyst. Musculoskeletal: No aggressive appearing focal osseous lesions. Multilevel thoracolumbar vertebral compression fractures from T5 through L3 with associated vertebroplasty material at each level. IMPRESSION: 1. Diffuse cylindrical and varicoid bronchiectasis in both lungs with associated bandlike scarring throughout the periphery of both lungs. Mild patchy tree-in-bud opacities and scattered mucoid impaction is significantly improved since 04/08/2018 chest CT. 2. Prior left lower and right middle lobectomy. 3. Chronic mosaic attenuation in both lungs is probably due to air  trapping. 4. Mild mediastinal lymphadenopathy is stable and probably reactive. 5. One vessel coronary atherosclerosis. Aortic Atherosclerosis (ICD10-I70.0). Electronically Signed   By: Ilona Sorrel M.D.   On: 09/28/2018 16:53   Mr Pelvis Wo Contrast  Result Date: 09/27/2018 CLINICAL DATA:  Right hip pain after a fall. Abnormal radiographs suggesting right sacral fracture. EXAM: MRI PELVIS WITHOUT CONTRAST TECHNIQUE: Multiplanar multisequence MR imaging of the pelvis was performed. No intravenous contrast was administered. COMPARISON:  Radiographs dated 09/27/2018 FINDINGS: Musculoskeletal: There is an acute nondisplaced intertrochanteric fracture of the proximal right femur. There is an adjacent tear an intramuscular hematoma in the right gluteus maximus muscle. Is hemorrhage into the right greater trochanteric bursa and into the vastus lateralis muscle. There is also a small strain or tear of the proximal right gluteus maximus muscle. There is a subtle fracture of the third sacral segment extending into the sacral ala I to the right and left. There are subtle nondisplaced fractures of right and left pubic bodies. There are old healed fractures of the right inferior and superior pubic rami. Urinary Tract:  No abnormality visualized. Bowel:  Numerous diverticula in distal colon.  Otherwise negative. Vascular/Lymphatic: No pathologically enlarged lymph nodes. No significant vascular abnormality seen. Reproductive:  No mass or other significant abnormality Other:  None. IMPRESSION: 1. Acute nondisplaced intertrochanteric fracture of the proximal right femur. 2. Nondisplaced subtle fractures of the S3 segment of the sacrum and of both pubic bodies. 3. Intramuscular tear and small hematoma and strain of the right gluteus maximus muscle. Small hematoma extending into the vastus lateralis muscle. Electronically Signed   By: Lorriane Shire M.D.   On: 09/27/2018 16:45   Dg Chest Port 1 View  Result Date:  09/27/2018 CLINICAL DATA:  COPD, fall EXAM: PORTABLE CHEST 1 VIEW COMPARISON:  10/09/2017 FINDINGS: Cardiac silhouette appears within normal limits. Calcific aortic knob. Diffuse interstitial thickening throughout both lungs. Prominent right suprahilar opacity, which appears more prominent compared to prior. There is hazy left basilar opacity, some of which may represent a component of hiatal hernia, better seen on prior exam. No pneumothorax is seen. The bones are diffusely demineralized. There are multiple thoracic spine compression deformity status post cement augmentation. No definite acute osseous abnormality within the limitations of this exam. IMPRESSION: 1. Prominent right suprahilar opacity, new from prior. Further evaluation with contrast-enhanced CT of the chest is recommended. 2. Hazy left basilar opacity. Electronically Signed   By: Marisue Brooklyn.D.  On: 09/27/2018 13:59   Dg Hip Operative Unilat W Or W/o Pelvis Right  Result Date: 09/30/2018 CLINICAL DATA:  Right hip fracture ORIF. EXAM: OPERATIVE RIGHT HIP (WITH PELVIS IF PERFORMED) TECHNIQUE: Fluoroscopic spot image(s) were submitted for interpretation post-operatively. Fluoroscopy time was 1.0 minutes. COMPARISON:  Right hip x-rays dated September 27, 2018. FINDINGS: Multiple intraoperative fluoroscopic images demonstrate interval gamma nail fixation of the right intertrochanteric femur fracture. Alignment is anatomic. IMPRESSION: 1. Intraoperative fluoroscopic guidance for right intertrochanteric femur fracture ORIF. Electronically Signed   By: Titus Dubin M.D.   On: 09/30/2018 11:28   Dg Hip Unilat With Pelvis 2-3 Views Right  Result Date: 09/27/2018 CLINICAL DATA:  73 year old female with a history of right hip pain after a fall EXAM: DG HIP (WITH OR WITHOUT PELVIS) 2-3V RIGHT COMPARISON:  None. FINDINGS: Osteopenia. Bony pelvic ring appears intact. Partially healed right inferior pubic ramus fracture. Disruption right-sided sacral  foramen. Nondisplaced right intertrochanteric fracture is identified on the cross-table lateral view. Changes of prior vertebral augmentation. IMPRESSION: Nondisplaced right intertrochanteric fracture, best seen on the cross-table lateral view Questionable acute right sacral fracture with disruption of the sacral foramen. Further evaluation with pelvic MRI may be useful. Incomplete healing/remodeling of right inferior pubic ramus fracture. Osteopenia. Electronically Signed   By: Corrie Mckusick D.O.   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      - current right hilar lesion may be shifted vascular structures vs new pulmonary malignancy. Will repeat CT chest      -patient dropped from 140lbs to 105.  She states in past year she actually gained weight appx 5lbs     - would recommend to continue chest physiotherapy with MetaNeb to recruit atelectatic segments.       -reviewed CT chest today - no new lesions, small denisty in lateral RML area likely due to post surgical changes.           Acute exacerbation of severe bilateral bronchiectasis     - solumedrol 40 IV bid      - VEST therapy , monitor for signs of pain for hip repair, switch to METANEB if possible      - continue chronic suppresive antibiotics from home as above      - nebulized mucomyst 464mbid with albuterol              Thank you for allowing me to participate in the care of this patient.   Patient/Family are satisfied with care plan and all questions have been answered.  This document was prepared using Dragon voice  recognition software and may include unintentional dictation errors.     FuOttie GlazierM.D.  Division of [place]

## 2018-10-01 ENCOUNTER — Encounter: Payer: Self-pay | Admitting: Orthopedic Surgery

## 2018-10-01 LAB — BASIC METABOLIC PANEL
Anion gap: 6 (ref 5–15)
BUN: 16 mg/dL (ref 8–23)
CO2: 35 mmol/L — ABNORMAL HIGH (ref 22–32)
Calcium: 8.3 mg/dL — ABNORMAL LOW (ref 8.9–10.3)
Chloride: 94 mmol/L — ABNORMAL LOW (ref 98–111)
Creatinine, Ser: 0.58 mg/dL (ref 0.44–1.00)
GFR calc Af Amer: >60 mL/min (ref >60)
GFR calc non Af Amer: >60 mL/min (ref >60)
Glucose, Bld: 171 mg/dL — ABNORMAL HIGH (ref 70–99)
Potassium: 4.3 mmol/L (ref 3.5–5.1)
Sodium: 135 mmol/L (ref 135–145)

## 2018-10-01 LAB — CBC
HCT: 26.7 % — ABNORMAL LOW (ref 36.0–46.0)
Hemoglobin: 8.8 g/dL — ABNORMAL LOW (ref 12.0–15.0)
MCH: 33.8 pg (ref 26.0–34.0)
MCHC: 33 g/dL (ref 30.0–36.0)
MCV: 102.7 fL — ABNORMAL HIGH (ref 80.0–100.0)
Platelets: 150 10*3/uL (ref 150–400)
RBC: 2.6 MIL/uL — ABNORMAL LOW (ref 3.87–5.11)
RDW: 12 % (ref 11.5–15.5)
WBC: 11.5 10*3/uL — ABNORMAL HIGH (ref 4.0–10.5)
nRBC: 0 % (ref 0.0–0.2)

## 2018-10-01 LAB — GLUCOSE, CAPILLARY
Glucose-Capillary: 152 mg/dL — ABNORMAL HIGH (ref 70–99)
Glucose-Capillary: 114 mg/dL — ABNORMAL HIGH (ref 70–99)

## 2018-10-01 MED ORDER — INSULIN ASPART 100 UNIT/ML ~~LOC~~ SOLN
0.0000 [IU] | Freq: Every day | SUBCUTANEOUS | Status: DC
  Filled 2018-10-01: qty 1

## 2018-10-01 MED ORDER — ALBUTEROL SULFATE HFA 108 (90 BASE) MCG/ACT IN AERS
2.0000 | INHALATION_SPRAY | Freq: Four times a day (QID) | RESPIRATORY_TRACT | Status: DC
  Administered 2018-10-01 – 2018-10-03 (×7): 2 via RESPIRATORY_TRACT
  Filled 2018-10-01: qty 6.7

## 2018-10-01 MED ORDER — INSULIN ASPART 100 UNIT/ML ~~LOC~~ SOLN
0.0000 [IU] | Freq: Three times a day (TID) | SUBCUTANEOUS | Status: DC
  Administered 2018-10-01: 18:00:00 2 [IU] via SUBCUTANEOUS
  Administered 2018-10-02: 1 [IU] via SUBCUTANEOUS
  Filled 2018-10-01 (×2): qty 1

## 2018-10-01 NOTE — Progress Notes (Signed)
PT Cancellation Note  Patient Details Name: Ana Washington MRN: [id number] DOB: [birth year]   Cancelled Treatment:    Reason Eval/Treat Not Completed: Pain limiting ability to participate.  Pt reporting high pain level and RN notified of pt request for pain medication.  Will return shortly.  Roxanne Gates, PT, DPT  Roxanne Gates 10/01/2018, 9:49 AM

## 2018-10-01 NOTE — Progress Notes (Signed)
PT Cancellation Note  Patient Details Name: XIARA BRADT MRN: [id number] DOB: 11-12-1945   Cancelled Treatment:    Reason Eval/Treat Not Completed: Patient declined, no reason specified.  Pt currently eating breakfast but stated that she is eager "to get going".  Will return shortly after she has had time to eat.  Roxanne Gates, PT, DPT  Roxanne Gates 10/01/2018, 8:56 AM

## 2018-10-01 NOTE — Progress Notes (Signed)
Infection Prevention  Received email from: Clayborne Dana RN- unit Director Unit:1A Regarding: Patient with visitor on 8/28 that tested positive for COVID on 8/30. They have tested patient and she is negative IP Recommendation: discussed with IP leadership and recommend treating patient with high risk exposure as positive. Airborne/contact isolation and negative pressure room if available on ortho floor. This is early for testing. Consider ID consult for guidance on future testing and isolation precautions.

## 2018-10-01 NOTE — Progress Notes (Signed)
SUBJECTIVE: Patient denies any chest pain or shortness of breath   Vitals:   09/30/18 2328 10/01/18 0305 10/01/18 0738 10/01/18 1100  BP: 122/79 119/78 137/85   Pulse: 76 72 82 97  Resp: 18 18 19    Temp: 97.6 F (36.4 C) (!) 97.3 F (36.3 C) 97.8 F (36.6 C)   TempSrc: Oral Oral    SpO2: 100% 100% 100% 98%  Weight:      Height:        Intake/Output Summary (Last 24 hours) at 10/01/2018 1137 Last data filed at 10/01/2018 0310 Gross per 24 hour  Intake 412.56 ml  Output 600 ml  Net -187.44 ml    LABS: Basic Metabolic Panel: Recent Labs    09/30/18 0421 10/01/18 0318  NA 133* 135  K 4.0 4.3  CL 89* 94*  CO2 35* 35*  GLUCOSE 92 171*  BUN 21 16  CREATININE 0.67 0.58  CALCIUM 9.2 8.3*   Liver Function Tests: No results for input(s): AST, ALT, ALKPHOS, BILITOT, PROT, ALBUMIN in the last 72 hours. No results for input(s): LIPASE, AMYLASE in the last 72 hours. CBC: Recent Labs    09/28/18 1440 09/30/18 0421 10/01/18 0318  WBC 23.1* 15.3* 11.5*  NEUTROABS 20.5*  --   --   HGB 11.5* 10.7* 8.8*  HCT 34.9* 32.7* 26.7*  MCV 102.0* 103.8* 102.7*  PLT 289 223 150   Cardiac Enzymes: No results for input(s): CKTOTAL, CKMB, CKMBINDEX, TROPONINI in the last 72 hours. BNP: Invalid input(s): POCBNP D-Dimer: No results for input(s): DDIMER in the last 72 hours. Hemoglobin A1C: No results for input(s): HGBA1C in the last 72 hours. Fasting Lipid Panel: No results for input(s): CHOL, HDL, LDLCALC, TRIG, CHOLHDL, LDLDIRECT in the last 72 hours. Thyroid Function Tests: No results for input(s): TSH, T4TOTAL, T3FREE, THYROIDAB in the last 72 hours.  Invalid input(s): FREET3 Anemia Panel: No results for input(s): VITAMINB12, FOLATE, FERRITIN, TIBC, IRON, RETICCTPCT in the last 72 hours.   PHYSICAL EXAM General: Well developed, well nourished, in no acute distress HEENT:  Normocephalic and atramatic Neck:  No JVD.  Lungs: Clear bilaterally to auscultation and  percussion. Heart: HRRR . Normal S1 and S2 without gallops or murmurs.  Abdomen: Bowel sounds are positive, abdomen soft and non-tender  Msk:  Back normal, normal gait. Normal strength and tone for age. Extremities: No clubbing, cyanosis or edema.   Neuro: Alert and oriented X 3. Psych:  Good affect, responds appropriately  TELEMETRY: Sinus rhythm  ASSESSMENT AND PLAN: Status post right hip fracture and status post surgery and clinically doing well.  Active Problems:   Closed right hip fracture (HCC)    Neoma Laming A, MD, [facility] 10/01/2018 11:37 AM

## 2018-10-01 NOTE — Progress Notes (Signed)
OT Cancellation Note  Patient Details Name: Ana Washington MRN: [id number] DOB: 11/04/1945   Cancelled Treatment:    Reason Eval/Treat Not Completed: Other (comment);Medical issues which prohibited therapy. Consult received and chart reviewed; patient noted with recent high risk COVID exposure and being treated as positive on Airbore/contact precautions.  Per current guidelines, patient being screened daily for critical rehab needs, and has seen physical therapy this date. Will continue to follow daily and will formally initiate OT services as medically appropriate and patient needs dictate.  Shara Blazing, M.S., OTR/L Ascom: [phone] 10/01/18, 12:40 PM

## 2018-10-01 NOTE — Progress Notes (Signed)
   Subjective: 1 Day Post-Op Procedure(s) (LRB): INTRAMEDULLARY (IM) NAIL INTERTROCHANTRIC (Right) Patient reports pain as mild.   Patient is well, and has had no acute complaints or problems Denies any CP,  ABD pain. SOB at baseline We will start physical therapy today.   Objective: Vital signs in last 24 hours: Temp:  [97.3 F (36.3 C)-98.5 F (36.9 C)] 97.8 F (36.6 C) (08/31 0738) Pulse Rate:  [72-90] 82 (08/31 0738) Resp:  [17-27] 19 (08/31 0738) BP: (101-137)/(62-85) 137/85 (08/31 0738) SpO2:  [91 %-100 %] 100 % (08/31 0738)  Intake/Output from previous day: 08/30 0701 - 08/31 0700 In: 1412.6 [I.V.:1055.1; IV Piggyback:357.5] Out: 1650 [Urine:1600; Blood:50] Intake/Output this shift: No intake/output data recorded.  Recent Labs    09/28/18 1440 09/30/18 0421 10/01/18 0318  HGB 11.5* 10.7* 8.8*   Recent Labs    09/30/18 0421 10/01/18 0318  WBC 15.3* 11.5*  RBC 3.15* 2.60*  HCT 32.7* 26.7*  PLT 223 150   Recent Labs    09/30/18 0421 10/01/18 0318  NA 133* 135  K 4.0 4.3  CL 89* 94*  CO2 35* 35*  BUN 21 16  CREATININE 0.67 0.58  GLUCOSE 92 171*  CALCIUM 9.2 8.3*   No results for input(s): LABPT, INR in the last 72 hours.  EXAM General - Patient is Alert, Appropriate and Oriented Extremity - Neurovascular intact Sensation intact distally Intact pulses distally Dorsiflexion/Plantar flexion intact No cellulitis present Compartment soft Dressing - dressing C/D/I and scant drainage Motor Function - intact, moving foot and toes well on exam.   Past Medical History:  Diagnosis Date  . Arthritis   . Atrial fibrillation ([place])   . Bronchiectasis ([place])   . CAD (coronary artery disease)   . CAD (coronary artery disease) 07/30/2017  . Dumping syndrome   . Dyspnea   . Dysrhythmia    afib  . 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 Family history of adverse reaction to anesthesia    sister [name]  . GERD (gastroesophageal reflux disease)   .  Headache    MIGRAINES  . Hypertension   . Lung disease   . Myocardial infarction ([place]) 2007   Non-STEMI  . [name] (postoperative nausea and vomiting)   . Psoriasis   . PUD (peptic ulcer disease)     Assessment/Plan:   1 Day Post-Op Procedure(s) (LRB): INTRAMEDULLARY (IM) NAIL INTERTROCHANTRIC (Right) Active Problems:   Closed right hip fracture (HCC)  Estimated body mass index is 17.47 kg/m as calculated from the following:   Height as of this encounter: 5\' 5"  (1.651 m).   Weight as of this encounter: 47.6 kg. Advance diet Up with therapy  Needs BM Acute post op blood loss anemia - Hgb 8.8. Continue with oral Iron supplement. Recheck labs in the am. Pain well controlled CM to assist with discharge to SNF    Remove staples and apply steri strips on 10/14/2018 Follow up with [facility] in 6 weeks TED hose BLE x 6 weeks, remove at night time  DVT Prophylaxis - TED hose and SCDs, Eliquis Weight-Bearing as tolerated to right leg   T. Rachelle Hora, PA-C [place] 10/01/2018, 8:10 AM

## 2018-10-01 NOTE — Care Management Important Message (Signed)
Important Message  Patient Details  Name: Ana Washington MRN: [id number] Date of Birth: February 09, 1945   Medicare Important Message Given:  Yes(CSW gave RN IM to give to patient because patient is in a negative pressure room because her visitor at [facility] tested positive for covid.)     Olamide Lahaie, Lenice Llamas 10/01/2018, 3:49 PM

## 2018-10-01 NOTE — Progress Notes (Addendum)
Pulmonary Medicine          Date: 10/01/2018,   MRN# [id number] Ana Washington [birth year]     AdmissionWeight: 47.6 kg                 CurrentWeight: 47.6 kg      CHIEF COMPLAINT:   Acute on chronic respiratory failure   SUBJECTIVE    Had + exposure to covid contact. Moved to neg pressure room.    She is on 4Lm/min O2 , doing Acapella and IS only due to possible covid not allowed to do nebulized therapy  PAST MEDICAL HISTORY   Past Medical History:  Diagnosis Date   Arthritis    Atrial fibrillation ([place])    Bronchiectasis ([place])    CAD (coronary artery disease)    CAD (coronary artery disease) 07/30/2017   Dumping syndrome    Dyspnea    Dysrhythmia    afib   Essential hypertension, malignant 10/03/2013   Family history of adverse reaction to anesthesia    sister [name]   GERD (gastroesophageal reflux disease)    Headache    MIGRAINES   Hypertension    Lung disease    Myocardial infarction ([place]) 2007   Non-STEMI   [name] (postoperative nausea and vomiting)    Psoriasis    PUD (peptic ulcer disease)      SURGICAL HISTORY   Past Surgical History:  Procedure Laterality Date   BACK SURGERY     CHOLECYSTECTOMY     EYE SURGERY     FOOT SURGERY     INTRAMEDULLARY (IM) NAIL INTERTROCHANTERIC Right 09/30/2018   Procedure: INTRAMEDULLARY (IM) NAIL INTERTROCHANTRIC;  Surgeon: Dereck Leep, MD;  Location: [facility] ORS;  Service: Orthopedics;  Laterality: Right;   KYPHOPLASTY N/A 07/05/2016   Procedure: KYPHOPLASTY T - 9;  Surgeon: Hessie Knows, MD;  Location: [facility];  Service: Orthopedics;  Laterality: N/A;   KYPHOPLASTY N/A 11/29/2017   Procedure: Iona Hansen;  Surgeon: Hessie Knows, MD;  Location: [facility];  Service: Orthopedics;  Laterality: N/A;  L2 and L3   KYPHOPLASTY N/A 12/18/2017   Procedure: KYPHOPLASTY L1;  Surgeon: Hessie Knows, MD;  Location: [facility];  Service: Orthopedics;  Laterality: N/A;   KYPHOPLASTY  N/A 01/05/2018   Procedure: KYPHOPLASTY-T11,T12;  Surgeon: Hessie Knows, MD;  Location: [facility];  Service: Orthopedics;  Laterality: N/A;   KYPHOPLASTY N/A 04/05/2018   Procedure: T10 KYPHOPLASTY;  Surgeon: Hessie Knows, MD;  Location: [facility];  Service: Orthopedics;  Laterality: N/A;   KYPHOPLASTY N/A 04/12/2018   Procedure: KYPHOPLASTY T7,8;  Surgeon: Hessie Knows, MD;  Location: [facility];  Service: Orthopedics;  Laterality: N/A;   KYPHOPLASTY N/A 04/19/2018   Procedure: KYPHOPLASTY T5, T6;  Surgeon: Hessie Knows, MD;  Location: [facility];  Service: Orthopedics;  Laterality: N/A;   LUNG SURGERY  1990 and 1996   THOROCOTOMY WITH LOBECTOMY     LEFT LOWER THORACOTOMY / RIGHT MIDDLE LOBECTOMY     FAMILY HISTORY   Family History  Problem Relation Age of Onset   Hypertension Mother    Hypertension Father      SOCIAL HISTORY   Social History   Tobacco Use   Smoking status: Never Smoker   Smokeless tobacco: Never Used  Substance Use Topics   Alcohol use: No   Drug use: No     MEDICATIONS    Home Medication:    Current Medication:  Current Facility-Administered Medications:    acetaminophen (TYLENOL) tablet 325-650 mg,  325-650 mg, Oral, Q6H PRN, Hooten, Laurice Record, MD, 650 mg at 10/01/18 0818   acidophilus (RISAQUAD) capsule 1 capsule, 1 capsule, Oral, Daily, Hooten, Laurice Record, MD, 1 capsule at 10/01/18 0811   albuterol (VENTOLIN HFA) 108 (90 Base) MCG/ACT inhaler 2 puff, 2 puff, Inhalation, QID, Mody, Sital, MD   apixaban (ELIQUIS) tablet 5 mg, 5 mg, Oral, BID, Hooten, Laurice Record, MD, 5 mg at 10/01/18 4403   azithromycin [facility]) tablet 250 mg, 250 mg, Oral, Daily, Hooten, Laurice Record, MD, 250 mg at 10/01/18 0801   bisacodyl (DULCOLAX) suppository 10 mg, 10 mg, Rectal, Daily PRN, Hooten, Laurice Record, MD   calcium-vitamin D (OSCAL WITH D) 500-200 MG-UNIT per tablet 1 tablet, 1 tablet, Oral, Daily, Hooten, Laurice Record, MD, 1 tablet at 09/29/18 4742    chlorpheniramine-HYDROcodone (TUSSIONEX) 10-8 MG/5ML suspension 5 mL, 5 mL, Oral, QHS, Hooten, Laurice Record, MD, 5 mL at 09/30/18 2116   ciprofloxacin (CIPRO) tablet 500 mg, 500 mg, Oral, BID, Hooten, Laurice Record, MD, 500 mg at 10/01/18 5956   diltiazem (CARDIZEM CD) 24 hr capsule 120 mg, 120 mg, Oral, Daily, Hooten, Laurice Record, MD, 120 mg at 10/01/18 3875   feeding supplement (ENSURE ENLIVE) (ENSURE ENLIVE) liquid 237 mL, 237 mL, Oral, BID BM, Hooten, Laurice Record, MD, 237 mL at 10/01/18 1311   ferrous sulfate tablet 325 mg, 325 mg, Oral, Daily, Hooten, Laurice Record, MD, 325 mg at 10/01/18 1314   gabapentin (NEURONTIN) capsule 300 mg, 300 mg, Oral, QID, Hooten, Laurice Record, MD, 300 mg at 10/01/18 1313   guaiFENesin-dextromethorphan (ROBITUSSIN DM) 100-10 MG/5ML syrup 5 mL, 5 mL, Oral, Q4H PRN, Hooten, Laurice Record, MD   HYDROmorphone (DILAUDID) injection 0.5-1 mg, 0.5-1 mg, Intravenous, Q4H PRN, Hooten, Laurice Record, MD   LORazepam (ATIVAN) tablet 0.5 mg, 0.5 mg, Oral, Q4H, Hooten, Laurice Record, MD, 0.5 mg at 10/01/18 1356   magnesium hydroxide (MILK OF MAGNESIA) suspension 30 mL, 30 mL, Oral, Daily PRN, Hooten, Laurice Record, MD, 30 mL at 10/01/18 0604   menthol-cetylpyridinium (CEPACOL) lozenge 3 mg, 1 lozenge, Oral, PRN **OR** phenol (CHLORASEPTIC) mouth spray 1 spray, 1 spray, Mouth/Throat, PRN, Hooten, Laurice Record, MD   meropenem (MERREM) 1 g in sodium chloride 0.9 % 100 mL IVPB, 1 g, Intravenous, Q12H, Dallie Piles, RPH, Last Rate: 200 mL/hr at 10/01/18 1037, 1 g at 10/01/18 1037   methylPREDNISolone sodium succinate (SOLU-MEDROL) 40 mg/mL injection 40 mg, 40 mg, Intravenous, BID, Ottie Glazier, MD, 40 mg at 10/01/18 0813   metoCLOPramide (REGLAN) tablet 5-10 mg, 5-10 mg, Oral, Q8H PRN **OR** metoCLOPramide (REGLAN) injection 5-10 mg, 5-10 mg, Intravenous, Q8H PRN, Hooten, Laurice Record, MD   metoCLOPramide (REGLAN) tablet 10 mg, 10 mg, Oral, TID AC & HS, Hooten, Laurice Record, MD, 10 mg at 10/01/18 1314   metoprolol succinate  (TOPROL-XL) 24 hr tablet 50 mg, 50 mg, Oral, QHS, Hooten, Laurice Record, MD, 50 mg at 09/30/18 2112   multivitamin with minerals tablet 1 tablet, 1 tablet, Oral, Daily, Hooten, Laurice Record, MD, 1 tablet at 09/29/18 0953   ondansetron (ZOFRAN) tablet 4 mg, 4 mg, Oral, Q6H PRN **OR** ondansetron (ZOFRAN) injection 4 mg, 4 mg, Intravenous, Q6H PRN, Hooten, Laurice Record, MD   oxyCODONE (Oxy IR/ROXICODONE) immediate release tablet 10 mg, 10 mg, Oral, Q4H PRN, Hooten, Laurice Record, MD   oxyCODONE (Oxy IR/ROXICODONE) immediate release tablet 5 mg, 5 mg, Oral, Q4H PRN, Hooten, Laurice Record, MD   pantoprazole (PROTONIX) EC tablet 40 mg, 40 mg, Oral, Daily,  Dereck Leep, MD, 40 mg at 10/01/18 4166   senna-docusate (Senokot-S) tablet 1 tablet, 1 tablet, Oral, BID, Hooten, Laurice Record, MD, 1 tablet at 10/01/18 0630   sodium phosphate (FLEET) 7-19 GM/118ML enema 1 enema, 1 enema, Rectal, Once PRN, Hooten, Laurice Record, MD   sucralfate (CARAFATE) tablet 1 g, 1 g, Oral, QID, Hooten, Laurice Record, MD, 1 g at 10/01/18 1601   traMADol (ULTRAM) tablet 50-100 mg, 50-100 mg, Oral, Q4H PRN, Hooten, Laurice Record, MD, 50 mg at 10/01/18 1009   vitamin C (ASCORBIC ACID) tablet 1,000 mg, 1,000 mg, Oral, Daily, Hooten, Laurice Record, MD, 1,000 mg at 10/01/18 0808    ALLERGIES   Sulfa antibiotics, Codeine, and Penicillins     REVIEW OF SYSTEMS    Review of Systems:  Gen:  Denies  fever, sweats, chills weigh loss  HEENT: Denies blurred vision, double vision, ear pain, eye pain, hearing loss, nose bleeds, sore throat Cardiac:  No dizziness, chest pain or heaviness, chest tightness,edema Resp:   Denies cough or sputum porduction, shortness of breath,wheezing, hemoptysis,  Gi: Denies swallowing difficulty, stomach pain, nausea or vomiting, diarrhea, constipation, bowel incontinence Gu:  Denies bladder incontinence, burning urine Ext:   Denies Joint pain, stiffness or swelling Skin: Denies  skin rash, easy bruising or bleeding or hives Endoc:  Denies  polyuria, polydipsia , polyphagia or weight change Psych:   Denies depression, insomnia or hallucinations   Other:  All other systems negative   VS: BP 137/85 (BP Location: Right Arm)    Pulse 97 Comment: rest: 97 BPM, transfer 116 BPM   Temp 97.8 F (36.6 C)    Resp 19    Ht _0  (1.651 m)    Wt 47.6 kg    SpO2 98%    BMI 17.47 kg/m      PHYSICAL EXAM    GENERAL:NAD, no fevers, chills, no weakness no fatigue HEAD: Normocephalic, atraumatic.  EYES: Pupils equal, round, reactive to light. Extraocular muscles intact. No scleral icterus.  MOUTH: Moist mucosal membrane. Dentition intact. No abscess noted.  EAR, NOSE, THROAT: Clear without exudates. No external lesions.  NECK: Supple. No thyromegaly. No nodules. No JVD.  PULMONARY:mild rhonchi bilaterally  CARDIOVASCULAR: S1 and S2. Regular rate and rhythm. No murmurs, rubs, or gallops. No edema. Pedal pulses 2+ bilaterally.  GASTROINTESTINAL: Soft, nontender, nondistended. No masses. Positive bowel sounds. No hepatosplenomegaly.  MUSCULOSKELETAL: No swelling, clubbing, or edema. Range of motion full in all extremities.  NEUROLOGIC: Cranial nerves II through XII are intact. No gross focal neurological deficits. Sensation intact. Reflexes intact.  SKIN: No ulceration, lesions, rashes, or cyanosis. Skin warm and dry. Turgor intact.  PSYCHIATRIC: Mood, affect within normal limits. The patient is awake, alert and oriented x 3. Insight, judgment intact.       IMAGING    Dg Chest 1 View  Result Date: 09/30/2018 CLINICAL DATA:  Hip fracture.  Preoperative exam. EXAM: CHEST  1 VIEW COMPARISON:  09/27/2018.  CT 09/28/2018. FINDINGS: Heart size is stable. Previous pulmonary resection in the right midlung. Widespread bilateral pulmonary opacity persists, shown by CT to be secondary to widespread bronchiectasis and alveolar opacities. No radiographic change. Multiple old vertebral augmentations. IMPRESSION: No significant radiographic change.  Extensive widespread bilateral pulmonary opacities as above and discussed completely by chest CT yesterday. Electronically Signed   By: Nelson Chimes M.D.   On: 09/30/2018 08:02   Ct Chest W Contrast  Result Date: 09/28/2018 CLINICAL DATA:  Inpatient. Dyspnea. Abnormal chest radiograph  with question new suprahilar opacity on the right. Clinical concern for interstitial lung disease. EXAM: CT CHEST WITH CONTRAST TECHNIQUE: Multidetector CT imaging of the chest was performed during intravenous contrast administration. CONTRAST:  69m OMNIPAQUE IOHEXOL 300 MG/ML  SOLN COMPARISON:  Chest radiograph from one day prior. 04/08/2018 chest CT angiogram. FINDINGS: Cardiovascular: Top-normal heart size. No significant pericardial effusion/thickening. Left anterior descending coronary atherosclerosis. Atherosclerotic nonaneurysmal thoracic aorta. Normal caliber main pulmonary artery. No central pulmonary emboli. Mediastinum/Nodes: No discrete thyroid nodules. Unremarkable esophagus. No axillary adenopathy. Mildly enlarged 1.3 cm low right paratracheal lymph node (series 2/image 56) and mildly enlarged 1.4 cm subcarinal node (series 2/image 67), both stable since 04/08/2018. No new pathologically enlarged mediastinal nodes. No hilar adenopathy. Lungs/Pleura: Status post left lower and right middle lobectomies. No pneumothorax. Trace dependent left pleural effusion. No right pleural effusion. There is diffuse moderate cylindrical and varicoid bronchiectasis throughout both lungs with associated diffuse bronchial wall thickening. No acute consolidative airspace disease or lung masses. Mild patchy tree-in-bud opacity and scattered mucoid impaction associated with the areas of bronchiectasis, substantially decreased in the interval. Extensive parenchymal bands throughout the periphery of both lungs compatible with postinfectious/postinflammatory scarring, unchanged. No significant pulmonary nodules. Mild interlobular septal  thickening and mosaic attenuation throughout both lungs, similar to prior. Upper abdomen: Cholecystectomy. Stable moderate intrahepatic biliary ductal dilatation. Simple 3.7 cm lateral left renal cyst. Musculoskeletal: No aggressive appearing focal osseous lesions. Multilevel thoracolumbar vertebral compression fractures from T5 through L3 with associated vertebroplasty material at each level. IMPRESSION: 1. Diffuse cylindrical and varicoid bronchiectasis in both lungs with associated bandlike scarring throughout the periphery of both lungs. Mild patchy tree-in-bud opacities and scattered mucoid impaction is significantly improved since 04/08/2018 chest CT. 2. Prior left lower and right middle lobectomy. 3. Chronic mosaic attenuation in both lungs is probably due to air trapping. 4. Mild mediastinal lymphadenopathy is stable and probably reactive. 5. One vessel coronary atherosclerosis. Aortic Atherosclerosis (ICD10-I70.0). Electronically Signed   By: JIlona SorrelM.D.   On: 09/28/2018 16:53   Mr Pelvis Wo Contrast  Result Date: 09/27/2018 CLINICAL DATA:  Right hip pain after a fall. Abnormal radiographs suggesting right sacral fracture. EXAM: MRI PELVIS WITHOUT CONTRAST TECHNIQUE: Multiplanar multisequence MR imaging of the pelvis was performed. No intravenous contrast was administered. COMPARISON:  Radiographs dated 09/27/2018 FINDINGS: Musculoskeletal: There is an acute nondisplaced intertrochanteric fracture of the proximal right femur. There is an adjacent tear an intramuscular hematoma in the right gluteus maximus muscle. Is hemorrhage into the right greater trochanteric bursa and into the vastus lateralis muscle. There is also a small strain or tear of the proximal right gluteus maximus muscle. There is a subtle fracture of the third sacral segment extending into the sacral ala I to the right and left. There are subtle nondisplaced fractures of right and left pubic bodies. There are old healed fractures  of the right inferior and superior pubic rami. Urinary Tract:  No abnormality visualized. Bowel:  Numerous diverticula in distal colon.  Otherwise negative. Vascular/Lymphatic: No pathologically enlarged lymph nodes. No significant vascular abnormality seen. Reproductive:  No mass or other significant abnormality Other:  None. IMPRESSION: 1. Acute nondisplaced intertrochanteric fracture of the proximal right femur. 2. Nondisplaced subtle fractures of the S3 segment of the sacrum and of both pubic bodies. 3. Intramuscular tear and small hematoma and strain of the right gluteus maximus muscle. Small hematoma extending into the vastus lateralis muscle. Electronically Signed   By: JLorriane ShireM.D.   On: 09/27/2018  16:45   Dg Chest Port 1 View  Result Date: 09/27/2018 CLINICAL DATA:  COPD, fall EXAM: PORTABLE CHEST 1 VIEW COMPARISON:  10/09/2017 FINDINGS: Cardiac silhouette appears within normal limits. Calcific aortic knob. Diffuse interstitial thickening throughout both lungs. Prominent right suprahilar opacity, which appears more prominent compared to prior. There is hazy left basilar opacity, some of which may represent a component of hiatal hernia, better seen on prior exam. No pneumothorax is seen. The bones are diffusely demineralized. There are multiple thoracic spine compression deformity status post cement augmentation. No definite acute osseous abnormality within the limitations of this exam. IMPRESSION: 1. Prominent right suprahilar opacity, new from prior. Further evaluation with contrast-enhanced CT of the chest is recommended. 2. Hazy left basilar opacity. Electronically Signed   By: Davina Poke M.D.   On: 09/27/2018 13:59   Dg Hip Operative Unilat W Or W/o Pelvis Right  Result Date: 09/30/2018 CLINICAL DATA:  Right hip fracture ORIF. EXAM: OPERATIVE RIGHT HIP (WITH PELVIS IF PERFORMED) TECHNIQUE: Fluoroscopic spot image(s) were submitted for interpretation post-operatively. Fluoroscopy  time was 1.0 minutes. COMPARISON:  Right hip x-rays dated September 27, 2018. FINDINGS: Multiple intraoperative fluoroscopic images demonstrate interval gamma nail fixation of the right intertrochanteric femur fracture. Alignment is anatomic. IMPRESSION: 1. Intraoperative fluoroscopic guidance for right intertrochanteric femur fracture ORIF. Electronically Signed   By: Titus Dubin M.D.   On: 09/30/2018 11:28   Dg Hip Unilat With Pelvis 2-3 Views Right  Result Date: 09/27/2018 CLINICAL DATA:  73 year old female with a history of right hip pain after a fall EXAM: DG HIP (WITH OR WITHOUT PELVIS) 2-3V RIGHT COMPARISON:  None. FINDINGS: Osteopenia. Bony pelvic ring appears intact. Partially healed right inferior pubic ramus fracture. Disruption right-sided sacral foramen. Nondisplaced right intertrochanteric fracture is identified on the cross-table lateral view. Changes of prior vertebral augmentation. IMPRESSION: Nondisplaced right intertrochanteric fracture, best seen on the cross-table lateral view Questionable acute right sacral fracture with disruption of the sacral foramen. Further evaluation with pelvic MRI may be useful. Incomplete healing/remodeling of right inferior pubic ramus fracture. Osteopenia. Electronically Signed   By: Corrie Mckusick D.O.   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      - current right hilar lesion may be shifted vascular structures vs new pulmonary malignancy. Will repeat CT chest      -patient dropped from 140lbs to 105.  She states in past year she actually gained  weight appx 5lbs     - would recommend to continue chest physiotherapy with MetaNeb to recruit atelectatic segments.       -reviewed CT chest today - no new lesions, small denisty in lateral RML area likely due to post surgical changes.        -patient followed by Dr RAshby Daweson outpatient basis and will be seen by him post hospitalization   Acute exacerbation of severe bilateral bronchiectasis     - solumedrol 40 IV bid      - VEST therapy , monitor for signs of pain for  hip repair, switch to METANEB if possible      - continue chronic suppresive antibiotics from home as above      - nebulized mucomyst 74m bid with albuterol      -pt moved to negative pressure room due to COVID exposure from sister who is +Covid             Thank you for allowing me to participate in the care of this patient.   Patient/Family are satisfied with care plan and all questions have been answered.  This document was prepared using Dragon voice recognition software and may include unintentional dictation errors.     FOttie Glazier M.D.  [facility] of [facility]

## 2018-10-01 NOTE — Discharge Instructions (Signed)
HIP REPLACEMENT POSTOPERATIVE DIRECTIONS  Hip Rehabilitation, Guidelines Following Surgery  The results of a hip operation are greatly improved after range of motion and muscle strengthening exercises. Follow all safety measures which are given to protect your hip. If any of these exercises cause increased pain or swelling in your joint, decrease the amount until you are comfortable again. Then slowly increase the exercises. Call your caregiver if you have problems or questions.   HOME CARE INSTRUCTIONS  Remove items at home which could result in a fall. This includes throw rugs or furniture in walking pathways.   ICE to the affected hip every three hours for 30 minutes at a time and then as needed for pain and swelling.  Continue to use ice on the hip for pain and swelling from surgery. You may notice swelling that will progress down to the foot and ankle.  This is normal after surgery.  Elevate the leg when you are not up walking on it.    Continue to use the breathing machine which will help keep your temperature down.  It is common for your temperature to cycle up and down following surgery, especially at night when you are not up moving around and exerting yourself.  The breathing machine keeps your lungs expanded and your temperature down.  DIET You may resume your previous home diet once your are discharged from the hospital.  DRESSING / WOUND CARE / SHOWERING You may start showering once staples have been removed. Change dressing as needed. Remove staples and apply steri strips 10/14/2018.    ACTIVITY Walk with your walker as instructed. Use walker as long as suggested by your caregivers. Avoid periods of inactivity such as sitting longer than an hour when not asleep. This helps prevent blood clots.  You may resume a sexual relationship in one month or when given the OK by your doctor.  You may return to work once you are cleared by your doctor.  Do not drive a car for 6 weeks or  until released by you surgeon.  Do not drive while taking narcotics.  WEIGHT BEARING Weight bearing as tolerated  POSTOPERATIVE CONSTIPATION PROTOCOL Constipation - defined medically as fewer than three stools per week and severe constipation as less than one stool per week.  One of the most common issues patients have following surgery is constipation.  Even if you have a regular bowel pattern at home, your normal regimen is likely to be disrupted due to multiple reasons following surgery.  Combination of anesthesia, postoperative narcotics, change in appetite and fluid intake all can affect your bowels.  In order to avoid complications following surgery, here are some recommendations in order to help you during your recovery period.  Colace (docusate) - Pick up an over-the-counter form of Colace or another stool softener and take twice a day as long as you are requiring postoperative pain medications.  Take with a full glass of water daily.  If you experience loose stools or diarrhea, hold the colace until you stool forms back up.  If your symptoms do not get better within 1 week or if they get worse, check with your doctor.  Dulcolax (bisacodyl) - Pick up over-the-counter and take as directed by the product packaging as needed to assist with the movement of your bowels.  Take with a full glass of water.  Use this product as needed if not relieved by Colace only.   MiraLax (polyethylene glycol) - Pick up over-the-counter to have on hand.  MiraLax is a solution that will increase the amount of water in your bowels to assist with bowel movements.  Take as directed and can mix with a glass of water, juice, soda, coffee, or tea.  Take if you go more than two days without a movement. Do not use MiraLax more than once per day. Call your doctor if you are still constipated or irregular after using this medication for 7 days in a row.  If you continue to have problems with postoperative constipation,  please contact the office for further assistance and recommendations.  If you experience "the worst abdominal pain ever" or develop nausea or vomiting, please contact the office immediatly for further recommendations for treatment.  ITCHING  If you experience itching with your medications, try taking only a single pain pill, or even half a pain pill at a time.  You can also use Benadryl over the counter for itching or also to help with sleep.   TED HOSE STOCKINGS Wear the elastic stockings on both legs for six weeks following surgery during the day but you may remove then at night for sleeping.  MEDICATIONS See your medication summary on the After Visit Summary that the nursing staff will review with you prior to discharge.  You may have some home medications which will be placed on hold until you complete the course of blood thinner medication.  It is important for you to complete the blood thinner medication as prescribed by your surgeon.  Continue your approved medications as instructed at time of discharge.  PRECAUTIONS If you experience chest pain or shortness of breath - call 911 immediately for transfer to the hospital emergency department.  If you develop a fever greater that 101 F, purulent drainage from wound, increased redness or drainage from wound, foul odor from the wound/dressing, or calf pain - CONTACT YOUR SURGEON.                                                   FOLLOW-UP APPOINTMENTS Make sure you keep all of your appointments after your operation with your surgeon and caregivers. You should call the office at the above phone number and make an appointment for approximately two weeks after the date of your surgery or on the date instructed by your surgeon outlined in the "After Visit Summary".  RANGE OF MOTION AND STRENGTHENING EXERCISES  These exercises are designed to help you keep full movement of your hip joint. Follow your caregiver's or physical therapist's  instructions. Perform all exercises about fifteen times, three times per day or as directed. Exercise both hips, even if you have had only one joint replacement. These exercises can be done on a training (exercise) mat, on the floor, on a table or on a bed. Use whatever works the best and is most comfortable for you. Use music or television while you are exercising so that the exercises are a pleasant break in your day. This will make your life better with the exercises acting as a break in routine you can look forward to.  Lying on your back, slowly slide your foot toward your buttocks, raising your knee up off the floor. Then slowly slide your foot back down until your leg is straight again.  Lying on your back spread your legs as far apart as you can without causing discomfort.  Lying on your side, raise your upper leg and foot straight up from the floor as far as is comfortable. Slowly lower the leg and repeat.  Lying on your back, tighten up the muscle in the front of your thigh (quadriceps muscles). You can do this by keeping your leg straight and trying to raise your heel off the floor. This helps strengthen the largest muscle supporting your knee.  Lying on your back, tighten up the muscles of your buttocks both with the legs straight and with the knee bent at a comfortable angle while keeping your heel on the floor.      IF YOU ARE TRANSFERRED TO A SKILLED REHAB FACILITY If the patient is transferred to a skilled rehab facility following release from the hospital, a list of the current medications will be sent to the facility for the patient to continue.  When discharged from the skilled rehab facility, please have the facility set up the patient's Rea prior to being released. Also, the skilled facility will be responsible for providing the patient with their medications at time of release from the facility to include their pain medication, the muscle relaxants, and their  blood thinner medication. If the patient is still at the rehab facility at time of the two week follow up appointment, the skilled rehab facility will also need to assist the patient in arranging follow up appointment in our office and any transportation needs.  MAKE SURE YOU:  Understand these instructions.  Get help right away if you are not doing well or get worse.    Pick up stool softner and laxative for home use following surgery while on pain medications. Continue to use ice for pain and swelling after surgery. Do not use any lotions or creams on the incision until instructed by your surgeon.

## 2018-10-01 NOTE — TOC Progression Note (Signed)
Transition of Care [facility]) - Progression Note    Patient Details  Name: Ana Washington MRN: [id number] Date of Birth: 07-May-1945  [facility] CM/SW Contact  Travanti Mcmanus, Lenice Llamas Phone Number: [phone]  10/01/2018, 3:43 PM  Clinical Narrative: Clinical Social Worker (CSW) contacted patient via telephone because she has moved to a negative pressure room. Per patient her sister that came to visit her at [facility] tested positive for covid and is now at the [facility]. CSW explained that PT is recommending SNF. Patient adamantly refused SNF and stated that she wants home health with [facility]. Per [facility] representative he will review referral. Patient reported that she has a walker, lift chair, and bedside commode at home. Patient reported that she hired a caregiver to come 4 days per week to help her and her husband. Patient continued to refuse SNF and reported that she needs oxygen because [facility] picked up her oxygen. [employer] agency representative is aware of above. CSW will continue to follow and assist as needed.     Expected Discharge Plan: [place] Barriers to Discharge: Continued Medical Work up  Expected Discharge Plan and Services Expected Discharge Plan: [place] In-house Referral: Clinical Social Work Discharge Planning Services: CM Consult   Living arrangements for the past 2 months: Single Family Home Expected Discharge Date: 09/30/18                                     Social Determinants of Health (SDOH) Interventions    Readmission Risk Interventions Readmission Risk Prevention Plan 01/06/2018  Transportation Screening Complete  PCP or Specialist Appt within 5-7 Days Complete  Home Care Screening Complete  Medication Review (RN CM) Complete  Some recent data might be hidden

## 2018-10-01 NOTE — Evaluation (Addendum)
Physical Therapy Evaluation Patient Details Name: Ana Washington MRN: [id number] DOB: [birth year] Today's Date: 10/01/2018   History of Present Illness  Pt is a 73 year old female admitted s/p I.M. nailing of R hip following a fall in her home.  Pt tripped over her O2 tubing.  PMH includes T10 kyphoplasty on 04/08/2018, atrial fibrillation, arthritis and anxiety.  Clinical Impression  Pt is a 73 year old female who lives in a one story home with her husband, who also requires a caregiver.  Pt is a household ambulator with use of RW at baseline.  She is HOH and appears anxious throughout treatment, though eager to "get moving".  Vitals monitored throughout evaluation with HR elevating during mobility but O2 remaining WNL on 4L.  Pt able to perform most supine there ex with VC's, manual assistance provided for heel slides and hip abduction.  Pt reported 4/10 pain in R hip.  She demonstrated fair strength of UE/LE's and required assistance in moving RLE during bed mobility, Mod-Max A.  Pt became very anxious when sitting at bedside and requested to be transferred to recliner quickly.  She required Max A for transfer and education concerning WB status.  Pt assisted in positioning and nursing assisted with transferring pt to new room.  Pt not appropriate for ambulation due to becoming tachycardic with minimal activity.  No back pain reported when asked throughout evaluation.  Pt will continue to benefit from skilled PT with focus on strength, tolerance to activity, pain management and safe functional mobility.  Due to change in mobility status, pt will benefit from SNF placement following discharge.      Follow Up Recommendations SNF    Equipment Recommendations  Other (comment)(TBD at next venue of care.)    Recommendations for Other Services       Precautions / Restrictions Precautions Precautions: Fall Precaution Comments: High Fall risk Restrictions Weight Bearing Restrictions: Yes RLE Weight  Bearing: Weight bearing as tolerated      Mobility  Bed Mobility Overal bed mobility: Needs Assistance Bed Mobility: Supine to Sit     Supine to sit: Max assist     General bed mobility comments: Assistance to bring B LE's over EOB and to support trunk.  Pt stopped mid-way through transfer and stated, "I don't know what to do!".  Required breathing exercises and encouragement throughout.  Transfers Overall transfer level: Needs assistance   Transfers: Stand Pivot Transfers   Stand pivot transfers: Max assist       General transfer comment: Pt's RR and HR began to increase and pt stated "I need to sit in that chair!".  PT deferred STS and assisted pt with standing pivot.  Pt able to WB minimally through LE's to assist, very vocal about pain throughout.  Ambulation/Gait                Stairs            Wheelchair Mobility    Modified Rankin (Stroke Patients Only)       Balance Overall balance assessment: Needs assistance Sitting-balance support: Bilateral upper extremity supported;Feet supported Sitting balance-Leahy Scale: Fair     Standing balance support: Bilateral upper extremity supported Standing balance-Leahy Scale: Poor Standing balance comment: Pt unable to manage a RW at this time due to pain and anxiety.  Will require time to adapt following surgery.  Pertinent Vitals/Pain Pain Assessment: 0-10 Pain Score: 4  Pain Location: R hip at rest. Pain Descriptors / Indicators: Aching Pain Intervention(s): Limited activity within patient's tolerance;Monitored during session    Home Living Family/patient expects to be discharged to:: Skilled nursing facility Living Arrangements: Spouse/significant other Available Help at Discharge: Family;Personal care attendant;Available PRN/intermittently Type of Home: House Home Access: Ramped entrance     Home Layout: One level Home Equipment: Walker - 2 wheels       Prior Function Level of Independence: Needs assistance   Gait / Transfers Assistance Needed: Primarily household ambulatory with RW.  ADL's / Homemaking Assistance Needed: Aid assists with ADL's.  Pt's husband is also very limited in mobility.        Hand Dominance        Extremity/Trunk Assessment   Upper Extremity Assessment Upper Extremity Assessment: Generalized weakness    Lower Extremity Assessment Lower Extremity Assessment: Generalized weakness;RLE deficits/detail RLE Deficits / Details: PF/DF: 3+/5, able to perform partial heel slide but very apprehensive due to pain. RLE: Unable to fully assess due to pain RLE Sensation: WNL RLE Coordination: decreased gross motor    Cervical / Trunk Assessment Cervical / Trunk Assessment: Kyphotic  Communication   Communication: HOH  Cognition Arousal/Alertness: Awake/alert Behavior During Therapy: Restless;WFL for tasks assessed/performed Overall Cognitive Status: Within Functional Limits for tasks assessed                                 General Comments: Follows commands consistently.      General Comments      Exercises General Exercises - Lower Extremity Ankle Circles/Pumps: 20 reps;Both;Supine;AROM Quad Sets: Right;10 reps;Supine;Strengthening Short Arc Quad: Right;10 reps;Supine;Strengthening Heel Slides: AAROM;Right;5 reps;Supine(partial) Hip ABduction/ADduction: Strengthening;Right;Supine;5 reps(partial range.) Other Exercises Other Exercises: Assisted nursing with transfer to new room due to IV, O2 and pt's mobility status.  Encouragement provided to pt throughout including breathing exercises.   x15 min   Assessment/Plan    PT Assessment Patient needs continued PT services  PT Problem List Decreased strength;Decreased mobility;Decreased range of motion;Decreased activity tolerance;Decreased balance;Decreased knowledge of use of DME;Decreased coordination;Cardiopulmonary status limiting  activity;Pain       PT Treatment Interventions DME instruction;Therapeutic activities;Gait training;Therapeutic exercise;Patient/family education;Balance training;Functional mobility training    PT Goals (Current goals can be found in the Care Plan section)  Acute Rehab PT Goals Patient Stated Goal: To get back to being able to walk on her own. PT Goal Formulation: With patient Time For Goal Achievement: 10/15/18 Potential to Achieve Goals: Good    Frequency 7X/week, addended 1313, 10/01/2018   Barriers to discharge        Co-evaluation               AM-PAC PT "6 Clicks" Mobility  Outcome Measure Help needed turning from your back to your side while in a flat bed without using bedrails?: A Lot Help needed moving from lying on your back to sitting on the side of a flat bed without using bedrails?: A Lot Help needed moving to and from a bed to a chair (including a wheelchair)?: A Lot Help needed standing up from a chair using your arms (e.g., wheelchair or bedside chair)?: A Lot Help needed to walk in hospital room?: A Lot Help needed climbing 3-5 steps with a railing? : Total 6 Click Score: 11    End of Session Equipment Utilized During Treatment: Gait belt;Oxygen Activity Tolerance: Patient  limited by fatigue;Patient limited by pain Patient left: in chair;with nursing/sitter in room Nurse Communication: Mobility status PT Visit Diagnosis: Unsteadiness on feet (R26.81);Muscle weakness (generalized) (M62.81);History of falling (Z91.81);Pain Pain - Right/Left: Right Pain - part of body: Hip    Time: 1015-1055 PT Time Calculation (min) (ACUTE ONLY): 40 min   Charges:   PT Evaluation $PT Eval Moderate Complexity: 1 Mod PT Treatments $Therapeutic Exercise: 8-22 mins $Therapeutic Activity: 23-37 mins      Roxanne Gates, PT, DPT   Roxanne Gates 10/01/2018, 11:15 AM, addendum 1313, 10/01/2018

## 2018-10-01 NOTE — Consult Note (Signed)
Pharmacy Antibiotic Note  Ana Washington is a 73 y.o. female admitted on 09/27/2018 with pneumonia and is now growing ESBL in her urine.  Pharmacy has been consulted for meropenem dosing. Patient is also receiving azithromycin and ciprofloxacin currently for prophylaxis due to history of chronic Mycobacterium AVM infection.  8/31: Day 2 of meropenem therapy. Plan for 5 days of treatment per MD note. Patient has been afebrile and leukocytosis is resolving.   Plan:  Continue meropenem 1 gram IV every 12 hours  Height: 5\' 5"  (165.1 cm) Weight: 105 lb (47.6 kg) IBW/kg (Calculated) : 57  Temp (24hrs), Avg:97.8 F (36.6 C), Min:97.3 F (36.3 C), Max:98.1 F (36.7 C)  Recent Labs  Lab 09/27/18 1309 09/28/18 0446 09/28/18 1440 09/29/18 0415 09/30/18 0421 10/01/18 0318  WBC 18.7* 15.4* 23.1*  --  15.3* 11.5*  CREATININE 0.58 0.65  --  0.67 0.67 0.58    Estimated Creatinine Clearance: 46.4 mL/min (by C-G formula based on SCr of 0.58 mg/dL).    Allergies  Allergen Reactions  . Sulfa Antibiotics Diarrhea  . Codeine Nausea And Vomiting  . Penicillins Rash    Has patient had a PCN reaction causing immediate rash, facial/tongue/throat swelling, SOB or lightheadedness with hypotension: Unknown Has patient had a PCN reaction causing severe rash involving mucus membranes or skin necrosis: Unknown Has patient had a PCN reaction that required hospitalization: Unknown Has patient had a PCN reaction occurring within the last 10 years: No If all of the above answers are "NO", then may proceed with Cephalosporin use.     Antimicrobials this admission: ceftriaxone 8/27 >> 8/28 azithromycin 8/27 >>  ciprofloxacin 8/28 >> meropenem 8/30 >>  Thank you for allowing pharmacy to be a part of this patient's care.  [place] Resident 10/01/2018 2:14 PM

## 2018-10-01 NOTE — Progress Notes (Signed)
[facility] [place] NAME: Ana Washington    MR#:  [id number]  DATE OF BIRTH:  13-Apr-1945  SUBJECTIVE:  No issues overnight Tolerated surgery well this am  REVIEW OF SYSTEMS:    Review of Systems  Constitutional: Negative for fever, chills weight loss HENT: Negative for ear pain, nosebleeds, congestion, facial swelling, rhinorrhea, neck pain, neck stiffness and ear discharge.   Respiratory: Negative for cough, shortness of breath, wheezing  Cardiovascular: Negative for chest pain, palpitations and leg swelling.  Gastrointestinal: Negative for heartburn, abdominal pain, vomiting, diarrhea or consitpation Genitourinary: Negative for dysuria, urgency, frequency, hematuria Musculoskeletal: Negative for back pain denies hip pain Neurological: Negative for dizziness, seizures, syncope, focal weakness,  numbness and headaches.  Hematological: Does not bruise/bleed easily.  Psychiatric/Behavioral: Negative for hallucinations, confusion, dysphoric mood    Tolerating Diet: yes     DRUG ALLERGIES:   Allergies  Allergen Reactions  . Sulfa Antibiotics Diarrhea  . Codeine Nausea And Vomiting  . Penicillins Rash    Has patient had a PCN reaction causing immediate rash, facial/tongue/throat swelling, SOB or lightheadedness with hypotension: Unknown Has patient had a PCN reaction causing severe rash involving mucus membranes or skin necrosis: Unknown Has patient had a PCN reaction that required hospitalization: Unknown Has patient had a PCN reaction occurring within the last 10 years: No If all of the above answers are "NO", then may proceed with Cephalosporin use.     VITALS:  Blood pressure 137/85, pulse 97, temperature 97.8 F (36.6 C), resp. rate 19, height _0  (1.651 m), weight 47.6 kg, SpO2 98 %.  PHYSICAL EXAMINATION:  Constitutional: Appears well-developed and well-nourished. No distress. HENT: Normocephalic. [name] Oropharynx is clear  and moist.  Eyes: Conjunctivae and EOM are normal. PERRLA, no scleral icterus.  Neck: Normal ROM. Neck supple. No JVD. No tracheal deviation. CVS: RRR, S1/S2 +, no murmurs, no gallops, no carotid bruit.  Pulmonary: Effort and breath sounds normal, no stridor, rhonchi, wheezes, rales.  Abdominal: Soft. BS +,  no distension, tenderness, rebound or guarding.  Musculoskeletal: can move her legsNeuro: Alert. CN 2-12 grossly intact. No focal deficits. Skin: Skin is warm and dry. No rash noted. Psychiatric: Normal mood and affect.      LABORATORY PANEL:   CBC Recent Labs  Lab 10/01/18 0318  WBC 11.5*  HGB 8.8*  HCT 26.7*  PLT 150   ------------------------------------------------------------------------------------------------------------------  Chemistries  Recent Labs  Lab 09/27/18 1309 09/27/18 1508  10/01/18 0318  NA 141  --    < > 135  K 3.0*  --    < > 4.3  CL 93*  --    < > 94*  CO2 35*  --    < > 35*  GLUCOSE 118*  --    < > 171*  BUN 14  --    < > 16  CREATININE 0.58  --    < > 0.58  CALCIUM 9.3  --    < > 8.3*  MG  --  1.6*  --   --   AST 30  --   --   --   ALT 20  --   --   --   ALKPHOS 49  --   --   --   BILITOT 0.7  --   --   --    < > = values in this interval not displayed.   ------------------------------------------------------------------------------------------------------------------  Cardiac Enzymes No results for input(s): TROPONINI  in the last 168 hours. ------------------------------------------------------------------------------------------------------------------  RADIOLOGY:  Dg Chest 1 View  Result Date: 09/30/2018 CLINICAL DATA:  Hip fracture.  Preoperative exam. EXAM: CHEST  1 VIEW COMPARISON:  09/27/2018.  CT 09/28/2018. FINDINGS: Heart size is stable. Previous pulmonary resection in the right midlung. Widespread bilateral pulmonary opacity persists, shown by CT to be secondary to widespread bronchiectasis and alveolar opacities. No  radiographic change. Multiple old vertebral augmentations. IMPRESSION: No significant radiographic change. Extensive widespread bilateral pulmonary opacities as above and discussed completely by chest CT yesterday. Electronically Signed   By: Nelson Chimes M.D.   On: 09/30/2018 08:02   Dg Hip Operative Unilat W Or W/o Pelvis Right  Result Date: 09/30/2018 CLINICAL DATA:  Right hip fracture ORIF. EXAM: OPERATIVE RIGHT HIP (WITH PELVIS IF PERFORMED) TECHNIQUE: Fluoroscopic spot image(s) were submitted for interpretation post-operatively. Fluoroscopy time was 1.0 minutes. COMPARISON:  Right hip x-rays dated September 27, 2018. FINDINGS: Multiple intraoperative fluoroscopic images demonstrate interval gamma nail fixation of the right intertrochanteric femur fracture. Alignment is anatomic. IMPRESSION: 1. Intraoperative fluoroscopic guidance for right intertrochanteric femur fracture ORIF. Electronically Signed   By: Titus Dubin M.D.   On: 09/30/2018 11:28     ASSESSMENT AND PLAN:    73 year old female with chronic hypoxic respiratory failure on 2 L of oxygen and CAD who presented to the emergency room due to mechanical fall and right sided hip pain.  1.  Nondisplaced right intertrochanteric fracture:  1 Day Post-Op Procedure(s) (LRB): INTRAMEDULLARY (IM) NAIL INTERTROCHANTRIC  DVT prophylaxis with Eliquis, TED hose and SCDs Weightbearing as tolerated to right leg Acute postop blood loss with hemoglobin 8.8.  No indication for blood transfusion at this time. Continue oral iron supplement CBC in a.m. PT consultation for discharge planning and clinical social work consult placed.   2.  Acute on chronic hypoxic respiratory failure with history of bronchiectasis/MAC and left lower lobe resection and acute exacerbation of severe bilateral bronchiectasis: Pulmonary consult appreciated Solu-Medrol 40 mg IV twice daily, wean as tolerated Continue chest physiotherapy Continue azithromycin and  ciprofloxacin for prophylaxis due to history of chronic Mycobacterium AVM infection. WBC may be due to steroids. No evidence of pneumonia or COPD exacerbation as per pulmonary.   3.  CAD: Continue metoprolol  4.  Essential hypertension: Continue diltiazem and metoprolol Holding Norvasc and losartan due to normal blood pressure.    5.  PAF: Patient currently in normal sinus rhythm Continue Eliquis. Continue diltiazem and metoprolol for heart rate control.  6.  ESBL UTI: Continue meropenem day 2 of 5  management plans discussed with the patient and she is in agreement.  CODE STATUS: dnr  TOTAL TIME TAKING CARE OF THIS PATIENT: 25 minutes.     POSSIBLE D/C tomorrow DEPENDING ON CLINICAL CONDITION.   Bettey Costa M.D on 10/01/2018 at 11:03 AM  Between 7am to 6pm - Pager - [phone] After 6pm go to [email or web address] - password [email or web address]  Office  [phone]  CC: Primary care physician; [name], MD  Note: This dictation was prepared with Dragon dictation along with smaller phrase technology. Any transcriptional errors that result from this process are unintentional.

## 2018-10-01 NOTE — Anesthesia Postprocedure Evaluation (Signed)
Anesthesia Post Note  Patient: Ana Washington  Procedure(s) Performed: INTRAMEDULLARY (IM) NAIL INTERTROCHANTRIC (Right )  Patient location during evaluation: Nursing Unit Anesthesia Type: Spinal Level of consciousness: oriented and awake and alert Pain management: pain level controlled Vital Signs Assessment: post-procedure vital signs reviewed and stable Respiratory status: spontaneous breathing and respiratory function stable Cardiovascular status: blood pressure returned to baseline and stable Postop Assessment: no headache, no backache, no apparent nausea or vomiting and patient able to bend at knees Anesthetic complications: no     Last Vitals:  Vitals:   10/01/18 0305 10/01/18 0738  BP: 119/78 137/85  Pulse: 72 82  Resp: 18 19  Temp: (!) 36.3 C 36.6 C  SpO2: 100% 100%    Last Pain:  Vitals:   10/01/18 0305  TempSrc: Oral  PainSc:                  Caryl Asp

## 2018-10-02 LAB — BASIC METABOLIC PANEL
Anion gap: 6 (ref 5–15)
BUN: 17 mg/dL (ref 8–23)
CO2: 35 mmol/L — ABNORMAL HIGH (ref 22–32)
Calcium: 8.7 mg/dL — ABNORMAL LOW (ref 8.9–10.3)
Chloride: 96 mmol/L — ABNORMAL LOW (ref 98–111)
Creatinine, Ser: 0.5 mg/dL (ref 0.44–1.00)
GFR calc Af Amer: >60 mL/min (ref >60)
GFR calc non Af Amer: >60 mL/min (ref >60)
Glucose, Bld: 137 mg/dL — ABNORMAL HIGH (ref 70–99)
Potassium: 5.4 mmol/L — ABNORMAL HIGH (ref 3.5–5.1)
Sodium: 137 mmol/L (ref 135–145)

## 2018-10-02 LAB — CBC
HCT: 25.5 % — ABNORMAL LOW (ref 36.0–46.0)
Hemoglobin: 8.3 g/dL — ABNORMAL LOW (ref 12.0–15.0)
MCH: 33.9 pg (ref 26.0–34.0)
MCHC: 32.5 g/dL (ref 30.0–36.0)
MCV: 104.1 fL — ABNORMAL HIGH (ref 80.0–100.0)
Platelets: 209 10*3/uL (ref 150–400)
RBC: 2.45 MIL/uL — ABNORMAL LOW (ref 3.87–5.11)
RDW: 12.1 % (ref 11.5–15.5)
WBC: 18.2 10*3/uL — ABNORMAL HIGH (ref 4.0–10.5)
nRBC: 0 % (ref 0.0–0.2)

## 2018-10-02 LAB — GLUCOSE, CAPILLARY
Glucose-Capillary: 128 mg/dL — ABNORMAL HIGH (ref 70–99)
Glucose-Capillary: 99 mg/dL (ref 70–99)
Glucose-Capillary: 141 mg/dL — ABNORMAL HIGH (ref 70–99)

## 2018-10-02 LAB — POTASSIUM: Potassium: 4.8 mmol/L (ref 3.5–5.1)

## 2018-10-02 LAB — MAGNESIUM: Magnesium: 2.2 mg/dL (ref 1.7–2.4)

## 2018-10-02 MED ORDER — PREDNISONE 50 MG PO TABS
50.0000 mg | ORAL_TABLET | Freq: Every day | ORAL | Status: DC
  Administered 2018-10-03 – 2018-10-04 (×2): 50 mg via ORAL
  Filled 2018-10-02 (×2): qty 1

## 2018-10-02 MED ORDER — POLYETHYLENE GLYCOL 3350 17 G PO PACK
17.0000 g | PACK | Freq: Every day | ORAL | Status: DC
  Administered 2018-10-02 – 2018-10-11 (×9): 17 g via ORAL
  Filled 2018-10-02 (×10): qty 1

## 2018-10-02 MED ORDER — INSULIN ASPART 100 UNIT/ML IV SOLN
10.0000 [IU] | Freq: Once | INTRAVENOUS | Status: AC
  Administered 2018-10-02: 10:00:00 10 [IU] via INTRAVENOUS
  Filled 2018-10-02: qty 0.1

## 2018-10-02 MED ORDER — PATIROMER SORBITEX CALCIUM 8.4 G PO PACK
8.4000 g | PACK | Freq: Every day | ORAL | Status: DC
  Filled 2018-10-02: qty 1

## 2018-10-02 MED ORDER — METHYLPREDNISOLONE SODIUM SUCC 40 MG IJ SOLR
40.0000 mg | Freq: Every day | INTRAMUSCULAR | Status: DC
  Administered 2018-10-02: 10:00:00 40 mg via INTRAVENOUS
  Filled 2018-10-02: qty 1

## 2018-10-02 MED ORDER — SODIUM CHLORIDE 3 % IN NEBU
4.0000 mL | INHALATION_SOLUTION | Freq: Every day | RESPIRATORY_TRACT | Status: AC
  Administered 2018-10-03 – 2018-10-04 (×2): 4 mL via RESPIRATORY_TRACT
  Filled 2018-10-02 (×3): qty 4

## 2018-10-02 MED ORDER — IPRATROPIUM-ALBUTEROL 0.5-2.5 (3) MG/3ML IN SOLN
3.0000 mL | Freq: Four times a day (QID) | RESPIRATORY_TRACT | Status: DC
  Administered 2018-10-03 – 2018-10-04 (×8): 3 mL via RESPIRATORY_TRACT
  Filled 2018-10-02 (×8): qty 3

## 2018-10-02 MED ORDER — PATIROMER SORBITEX CALCIUM 8.4 G PO PACK
8.4000 g | PACK | ORAL | Status: AC
  Administered 2018-10-02: 8.4 g via ORAL
  Filled 2018-10-02: qty 1

## 2018-10-02 MED ORDER — DEXTROSE 50 % IV SOLN
1.0000 | Freq: Once | INTRAVENOUS | Status: AC
  Administered 2018-10-02: 10:00:00 50 mL via INTRAVENOUS
  Filled 2018-10-02: qty 50

## 2018-10-02 NOTE — Progress Notes (Addendum)
OT Cancellation Note  Patient Details Name: Ana Washington MRN: [id number] DOB: 06-22-1945   Cancelled Treatment:    Reason Eval/Treat Not Completed: Other (comment). Per chart, pt's K+ now within appropriate range for participation with therapy. Pt currently working with PT. Will continue to follow acutely, and re-attempt at later date/time as pt is available and medically appropriate. Spoke with LCSW, home health services are being arranged.  On 3rd attempt today, spoke with pt. Pt reporting fatigue from working with PT and reporting pain, however improving since having her leg prompted up while sitting in the recliner. Educated pt in role of OT and tentative plan for the evaluation. Pt politely declines despite education 2/2 fatigue. Pt agreeable to re-attempt in the morning.   Jeni Salles, MPH, MS, OTR/L ascom [phone] 10/02/18, 3:25 PM

## 2018-10-02 NOTE — TOC Progression Note (Signed)
Transition of Care [facility]) - Progression Note    Patient Details  Name: Ana Washington MRN: [id number] Date of Birth: October 15, 1945  Transition of Care [facility] CM/SW Contact  Alize Borrayo, Lenice Llamas Phone Number: [phone]  10/02/2018, 3:10 PM  Clinical Narrative: Per [facility] representative they can't accept patient. [facility] and [facility] also declined referral. Per [facility] home health representative they can accept patient for home health PT, OT, RN and aide. Clinical Social Worker (CSW) made patient aware of above. Patient is agreeable to [facility] home health. CSW will continue to follow and assist as needed.       Expected Discharge Plan: [place] Barriers to Discharge: Continued Medical Work up  Expected Discharge Plan and Services Expected Discharge Plan: [place] In-house Referral: Clinical Social Work Discharge Planning Services: CM Consult   Living arrangements for the past 2 months: Single Family Home Expected Discharge Date: 09/30/18                                     Social Determinants of Health (SDOH) Interventions    Readmission Risk Interventions Readmission Risk Prevention Plan 01/06/2018  Transportation Screening Complete  PCP or Specialist Appt within 5-7 Days Complete  Home Care Screening Complete  Medication Review (RN CM) Complete  Some recent data might be hidden

## 2018-10-02 NOTE — Consult Note (Signed)
Pharmacy Antibiotic Note  Ana Washington is a 73 y.o. female admitted on 09/27/2018 with pneumonia and is now growing ESBL in her urine.  Pharmacy has been consulted for meropenem dosing. Patient is also receiving azithromycin and ciprofloxacin currently for prophylaxis due to history of chronic Mycobacterium AVM infection.   9/1: Day 3 of meropenem therapy. Plan for 5 days of treatment per MD note. WBC trending up likely secondary to methylprednisolone. Patient has been afebrile although taking tylenol last couple of days.   Plan:  Continue meropenem 1 gram IV every 12 hours  Height: 5\' 5"  (165.1 cm) Weight: 105 lb (47.6 kg) IBW/kg (Calculated) : 57  Temp (24hrs), Avg:97.9 F (36.6 C), Min:97.8 F (36.6 C), Max:98.1 F (36.7 C)  Recent Labs  Lab 09/28/18 0446 09/28/18 1440 09/29/18 0415 09/30/18 0421 10/01/18 0318 10/02/18 0603  WBC 15.4* 23.1*  --  15.3* 11.5* 18.2*  CREATININE 0.65  --  0.67 0.67 0.58 0.50    Estimated Creatinine Clearance: 46.4 mL/min (by C-G formula based on SCr of 0.5 mg/dL).    Allergies  Allergen Reactions  . Sulfa Antibiotics Diarrhea  . Codeine Nausea And Vomiting  . Penicillins Rash    Has patient had a PCN reaction causing immediate rash, facial/tongue/throat swelling, SOB or lightheadedness with hypotension: Unknown Has patient had a PCN reaction causing severe rash involving mucus membranes or skin necrosis: Unknown Has patient had a PCN reaction that required hospitalization: Unknown Has patient had a PCN reaction occurring within the last 10 years: No If all of the above answers are "NO", then may proceed with Cephalosporin use.     Antimicrobials this admission: ceftriaxone 8/27 >> 8/28 azithromycin 8/27 >>  ciprofloxacin 8/28 >> meropenem 8/30 >>  Thank you for allowing pharmacy to be a part of this patient's care.  [place] Resident 10/02/2018 7:57 AM

## 2018-10-02 NOTE — Progress Notes (Signed)
OT Cancellation Note  Patient Details Name: Ana Washington MRN: [id number] DOB: 11/05/1945   Cancelled Treatment:    Reason Eval/Treat Not Completed: Other (comment);Medical issues which prohibited therapy. Chart reviewed. Pt noted with K+ 5.4. Scheduled for [place] to address potassium. Will hold at this time and re-attempt at later date/time as pt is medically appropriate.   Jeni Salles, MPH, MS, OTR/L ascom [phone] 10/02/18, 8:06 AM

## 2018-10-02 NOTE — Progress Notes (Signed)
PT Cancellation Note  Patient Details Name: Ana Washington MRN: [id number] DOB: December 12, 1945   Cancelled Treatment:    Reason Eval/Treat Not Completed: Medical issues which prohibited therapy.  Chart reviewed. Pt noted with K+ 5.4. Scheduled for [place] to address potassium. Will hold at this time and re-attempt at later date/time as pt is medically appropriate.   Roxanne Gates, PT, DPT   Roxanne Gates 10/02/2018, 8:57 AM

## 2018-10-02 NOTE — Progress Notes (Signed)
Pulmonary Medicine          Date: 10/02/2018,   MRN# [id number] Ana Washington 07/08/1945     AdmissionWeight: 47.6 kg                 CurrentWeight: 47.6 kg      CHIEF COMPLAINT:   Acute on chronic respiratory failure   SUBJECTIVE    Had + exposure to covid contact. Moved to neg pressure room.    She is now down to 3Lmin O2 theray  She is not using IS and Acapella much.   Can restart nebulizer therapy as repeat COVID is negative.   PAST MEDICAL HISTORY   Past Medical History:  Diagnosis Date   Arthritis    Atrial fibrillation (HCC)    Bronchiectasis (HCC)    CAD (coronary artery disease)    CAD (coronary artery disease) 07/30/2017   Dumping syndrome    Dyspnea    Dysrhythmia    afib   Essential hypertension, malignant 10/03/2013   Family history of adverse reaction to anesthesia    sister [name]   GERD (gastroesophageal reflux disease)    Headache    MIGRAINES   Hypertension    Lung disease    Myocardial infarction ([place]) 2007   Non-STEMI   [name] (postoperative nausea and vomiting)    Psoriasis    PUD (peptic ulcer disease)      SURGICAL HISTORY   Past Surgical History:  Procedure Laterality Date   BACK SURGERY     CHOLECYSTECTOMY     EYE SURGERY     FOOT SURGERY     INTRAMEDULLARY (IM) NAIL INTERTROCHANTERIC Right 09/30/2018   Procedure: INTRAMEDULLARY (IM) NAIL INTERTROCHANTRIC;  Surgeon: Dereck Leep, MD;  Location: [facility];  Service: Orthopedics;  Laterality: Right;   KYPHOPLASTY N/A 07/05/2016   Procedure: KYPHOPLASTY T - 9;  Surgeon: Hessie Knows, MD;  Location: [facility];  Service: Orthopedics;  Laterality: N/A;   KYPHOPLASTY N/A 11/29/2017   Procedure: Iona Hansen;  Surgeon: Hessie Knows, MD;  Location: [facility];  Service: Orthopedics;  Laterality: N/A;  L2 and L3   KYPHOPLASTY N/A 12/18/2017   Procedure: KYPHOPLASTY L1;  Surgeon: Hessie Knows, MD;  Location: [facility];  Service: Orthopedics;   Laterality: N/A;   KYPHOPLASTY N/A 01/05/2018   Procedure: KYPHOPLASTY-T11,T12;  Surgeon: Hessie Knows, MD;  Location: [facility];  Service: Orthopedics;  Laterality: N/A;   KYPHOPLASTY N/A 04/05/2018   Procedure: T10 KYPHOPLASTY;  Surgeon: Hessie Knows, MD;  Location: [facility];  Service: Orthopedics;  Laterality: N/A;   KYPHOPLASTY N/A 04/12/2018   Procedure: KYPHOPLASTY T7,8;  Surgeon: Hessie Knows, MD;  Location: [facility];  Service: Orthopedics;  Laterality: N/A;   KYPHOPLASTY N/A 04/19/2018   Procedure: KYPHOPLASTY T5, T6;  Surgeon: Hessie Knows, MD;  Location: [facility];  Service: Orthopedics;  Laterality: N/A;   LUNG SURGERY  1990 and 1996   THOROCOTOMY WITH LOBECTOMY     LEFT LOWER THORACOTOMY / RIGHT MIDDLE LOBECTOMY     FAMILY HISTORY   Family History  Problem Relation Age of Onset   Hypertension Mother    Hypertension Father      SOCIAL HISTORY   Social History   Tobacco Use   Smoking status: Never Smoker   Smokeless tobacco: Never Used  Substance Use Topics   Alcohol use: No   Drug use: No     MEDICATIONS    Home Medication:    Current Medication:  Current Facility-Administered Medications:  acetaminophen (TYLENOL) tablet 325-650 mg, 325-650 mg, Oral, Q6H PRN, Hooten, Laurice Record, MD, 650 mg at 10/01/18 0818   acidophilus (RISAQUAD) capsule 1 capsule, 1 capsule, Oral, Daily, Hooten, Laurice Record, MD, 1 capsule at 10/02/18 1008   albuterol (VENTOLIN HFA) 108 (90 Base) MCG/ACT inhaler 2 puff, 2 puff, Inhalation, QID, Mody, Sital, MD, 2 puff at 10/02/18 1250   apixaban (ELIQUIS) tablet 5 mg, 5 mg, Oral, BID, Hooten, Laurice Record, MD, 5 mg at 10/02/18 9509   azithromycin (ZITHROMAX) tablet 250 mg, 250 mg, Oral, Daily, Hooten, Laurice Record, MD, 250 mg at 10/02/18 3267   bisacodyl (DULCOLAX) suppository 10 mg, 10 mg, Rectal, Daily PRN, Hooten, Laurice Record, MD   calcium-vitamin D (OSCAL WITH D) 500-200 MG-UNIT per tablet 1 tablet, 1 tablet, Oral, Daily, Hooten, Laurice Record, MD, 1 tablet at 09/29/18 1245   chlorpheniramine-HYDROcodone (TUSSIONEX) 10-8 MG/5ML suspension 5 mL, 5 mL, Oral, QHS, Hooten, Laurice Record, MD, 5 mL at 10/01/18 2121   ciprofloxacin (CIPRO) tablet 500 mg, 500 mg, Oral, BID, Hooten, Laurice Record, MD, 500 mg at 10/02/18 1008   diltiazem (CARDIZEM CD) 24 hr capsule 120 mg, 120 mg, Oral, Daily, Hooten, Laurice Record, MD, 120 mg at 10/02/18 0951   feeding supplement (ENSURE ENLIVE) (ENSURE ENLIVE) liquid 237 mL, 237 mL, Oral, BID BM, Hooten, Laurice Record, MD, 237 mL at 10/02/18 1529   ferrous sulfate tablet 325 mg, 325 mg, Oral, Daily, Hooten, Laurice Record, MD, 325 mg at 10/02/18 1249   gabapentin (NEURONTIN) capsule 300 mg, 300 mg, Oral, QID, Hooten, Laurice Record, MD, 300 mg at 10/02/18 1529   guaiFENesin-dextromethorphan (ROBITUSSIN DM) 100-10 MG/5ML syrup 5 mL, 5 mL, Oral, Q4H PRN, Hooten, Laurice Record, MD   HYDROmorphone (DILAUDID) injection 0.5-1 mg, 0.5-1 mg, Intravenous, Q4H PRN, Hooten, Laurice Record, MD   LORazepam (ATIVAN) tablet 0.5 mg, 0.5 mg, Oral, Q4H, Hooten, Laurice Record, MD, 0.5 mg at 10/02/18 1529   magnesium hydroxide (MILK OF MAGNESIA) suspension 30 mL, 30 mL, Oral, Daily PRN, Hooten, Laurice Record, MD, 30 mL at 10/02/18 0951   menthol-cetylpyridinium (CEPACOL) lozenge 3 mg, 1 lozenge, Oral, PRN **OR** phenol (CHLORASEPTIC) mouth spray 1 spray, 1 spray, Mouth/Throat, PRN, Hooten, Laurice Record, MD   meropenem (MERREM) 1 g in sodium chloride 0.9 % 100 mL IVPB, 1 g, Intravenous, Q12H, Dallie Piles, RPH, Last Rate: 200 mL/hr at 10/02/18 1129, 1 g at 10/02/18 1129   methylPREDNISolone sodium succinate (SOLU-MEDROL) 40 mg/mL injection 40 mg, 40 mg, Intravenous, Daily, Mody, Sital, MD, 40 mg at 10/02/18 0953   metoCLOPramide (REGLAN) tablet 5-10 mg, 5-10 mg, Oral, Q8H PRN **OR** metoCLOPramide (REGLAN) injection 5-10 mg, 5-10 mg, Intravenous, Q8H PRN, Hooten, Laurice Record, MD   metoprolol succinate (TOPROL-XL) 24 hr tablet 50 mg, 50 mg, Oral, QHS, Hooten, Laurice Record, MD, 50 mg at  10/01/18 2120   multivitamin with minerals tablet 1 tablet, 1 tablet, Oral, Daily, Hooten, Laurice Record, MD, 1 tablet at 09/29/18 0953   ondansetron (ZOFRAN) tablet 4 mg, 4 mg, Oral, Q6H PRN **OR** ondansetron (ZOFRAN) injection 4 mg, 4 mg, Intravenous, Q6H PRN, Hooten, Laurice Record, MD   oxyCODONE (Oxy IR/ROXICODONE) immediate release tablet 10 mg, 10 mg, Oral, Q4H PRN, Hooten, Laurice Record, MD   oxyCODONE (Oxy IR/ROXICODONE) immediate release tablet 5 mg, 5 mg, Oral, Q4H PRN, Hooten, Laurice Record, MD   pantoprazole (PROTONIX) EC tablet 40 mg, 40 mg, Oral, Daily, Hooten, Laurice Record, MD, 40 mg at 10/02/18 0952   polyethylene glycol (  MIRALAX / GLYCOLAX) packet 17 g, 17 g, Oral, Daily, Mody, Sital, MD, 17 g at 10/02/18 1250   senna-docusate (Senokot-S) tablet 1 tablet, 1 tablet, Oral, BID, Hooten, Laurice Record, MD, 1 tablet at 10/02/18 0951   sodium phosphate (FLEET) 7-19 GM/118ML enema 1 enema, 1 enema, Rectal, Once PRN, Hooten, Laurice Record, MD   sucralfate (CARAFATE) tablet 1 g, 1 g, Oral, QID, Hooten, Laurice Record, MD, 1 g at 10/01/18 2123   traMADol (ULTRAM) tablet 50-100 mg, 50-100 mg, Oral, Q4H PRN, Hooten, Laurice Record, MD, 50 mg at 10/02/18 1538   vitamin C (ASCORBIC ACID) tablet 1,000 mg, 1,000 mg, Oral, Daily, Hooten, Laurice Record, MD, 1,000 mg at 10/02/18 4920    ALLERGIES   Sulfa antibiotics, Codeine, and Penicillins     REVIEW OF SYSTEMS    Review of Systems:  Gen:  Denies  fever, sweats, chills weigh loss  HEENT: Denies blurred vision, double vision, ear pain, eye pain, hearing loss, nose bleeds, sore throat Cardiac:  No dizziness, chest pain or heaviness, chest tightness,edema Resp:   Denies cough or sputum porduction, shortness of breath,wheezing, hemoptysis,  Gi: Denies swallowing difficulty, stomach pain, nausea or vomiting, diarrhea, constipation, bowel incontinence Gu:  Denies bladder incontinence, burning urine Ext:   Denies Joint pain, stiffness or swelling Skin: Denies  skin rash, easy bruising or  bleeding or hives Endoc:  Denies polyuria, polydipsia , polyphagia or weight change Psych:   Denies depression, insomnia or hallucinations   Other:  All other systems negative   VS: BP 132/66 (BP Location: Right Arm)    Pulse 85    Temp 97.7 F (36.5 C) (Oral)    Resp 17    Ht _0  (1.651 m)    Wt 47.6 kg    SpO2 100%    BMI 17.47 kg/m      PHYSICAL EXAM    GENERAL:NAD, no fevers, chills, no weakness no fatigue HEAD: Normocephalic, atraumatic.  EYES: Pupils equal, round, reactive to light. Extraocular muscles intact. No scleral icterus.  MOUTH: Moist mucosal membrane. Dentition intact. No abscess noted.  EAR, NOSE, THROAT: Clear without exudates. No external lesions.  NECK: Supple. No thyromegaly. No nodules. No JVD.  PULMONARY:mild rhonchi bilaterally  CARDIOVASCULAR: S1 and S2. Regular rate and rhythm. No murmurs, rubs, or gallops. No edema. Pedal pulses 2+ bilaterally.  GASTROINTESTINAL: Soft, nontender, nondistended. No masses. Positive bowel sounds. No hepatosplenomegaly.  MUSCULOSKELETAL: No swelling, clubbing, or edema. Range of motion full in all extremities.  NEUROLOGIC: Cranial nerves II through XII are intact. No gross focal neurological deficits. Sensation intact. Reflexes intact.  SKIN: No ulceration, lesions, rashes, or cyanosis. Skin warm and dry. Turgor intact.  PSYCHIATRIC: Mood, affect within normal limits. The patient is awake, alert and oriented x 3. Insight, judgment intact.       IMAGING    Dg Chest 1 View  Result Date: 09/30/2018 CLINICAL DATA:  Hip fracture.  Preoperative exam. EXAM: CHEST  1 VIEW COMPARISON:  09/27/2018.  CT 09/28/2018. FINDINGS: Heart size is stable. Previous pulmonary resection in the right midlung. Widespread bilateral pulmonary opacity persists, shown by CT to be secondary to widespread bronchiectasis and alveolar opacities. No radiographic change. Multiple old vertebral augmentations. IMPRESSION: No significant radiographic change.  Extensive widespread bilateral pulmonary opacities as above and discussed completely by chest CT yesterday. Electronically Signed   By: Nelson Chimes M.D.   On: 09/30/2018 08:02   Ct Chest W Contrast  Result Date: 09/28/2018 CLINICAL DATA:  Inpatient.  Dyspnea. Abnormal chest radiograph with question new suprahilar opacity on the right. Clinical concern for interstitial lung disease. EXAM: CT CHEST WITH CONTRAST TECHNIQUE: Multidetector CT imaging of the chest was performed during intravenous contrast administration. CONTRAST:  70m OMNIPAQUE IOHEXOL 300 MG/ML  SOLN COMPARISON:  Chest radiograph from one day prior. 04/08/2018 chest CT angiogram. FINDINGS: Cardiovascular: Top-normal heart size. No significant pericardial effusion/thickening. Left anterior descending coronary atherosclerosis. Atherosclerotic nonaneurysmal thoracic aorta. Normal caliber main pulmonary artery. No central pulmonary emboli. Mediastinum/Nodes: No discrete thyroid nodules. Unremarkable esophagus. No axillary adenopathy. Mildly enlarged 1.3 cm low right paratracheal lymph node (series 2/image 56) and mildly enlarged 1.4 cm subcarinal node (series 2/image 67), both stable since 04/08/2018. No new pathologically enlarged mediastinal nodes. No hilar adenopathy. Lungs/Pleura: Status post left lower and right middle lobectomies. No pneumothorax. Trace dependent left pleural effusion. No right pleural effusion. There is diffuse moderate cylindrical and varicoid bronchiectasis throughout both lungs with associated diffuse bronchial wall thickening. No acute consolidative airspace disease or lung masses. Mild patchy tree-in-bud opacity and scattered mucoid impaction associated with the areas of bronchiectasis, substantially decreased in the interval. Extensive parenchymal bands throughout the periphery of both lungs compatible with postinfectious/postinflammatory scarring, unchanged. No significant pulmonary nodules. Mild interlobular septal  thickening and mosaic attenuation throughout both lungs, similar to prior. Upper abdomen: Cholecystectomy. Stable moderate intrahepatic biliary ductal dilatation. Simple 3.7 cm lateral left renal cyst. Musculoskeletal: No aggressive appearing focal osseous lesions. Multilevel thoracolumbar vertebral compression fractures from T5 through L3 with associated vertebroplasty material at each level. IMPRESSION: 1. Diffuse cylindrical and varicoid bronchiectasis in both lungs with associated bandlike scarring throughout the periphery of both lungs. Mild patchy tree-in-bud opacities and scattered mucoid impaction is significantly improved since 04/08/2018 chest CT. 2. Prior left lower and right middle lobectomy. 3. Chronic mosaic attenuation in both lungs is probably due to air trapping. 4. Mild mediastinal lymphadenopathy is stable and probably reactive. 5. One vessel coronary atherosclerosis. Aortic Atherosclerosis (ICD10-I70.0). Electronically Signed   By: JIlona SorrelM.D.   On: 09/28/2018 16:53   Mr Pelvis Wo Contrast  Result Date: 09/27/2018 CLINICAL DATA:  Right hip pain after a fall. Abnormal radiographs suggesting right sacral fracture. EXAM: MRI PELVIS WITHOUT CONTRAST TECHNIQUE: Multiplanar multisequence MR imaging of the pelvis was performed. No intravenous contrast was administered. COMPARISON:  Radiographs dated 09/27/2018 FINDINGS: Musculoskeletal: There is an acute nondisplaced intertrochanteric fracture of the proximal right femur. There is an adjacent tear an intramuscular hematoma in the right gluteus maximus muscle. Is hemorrhage into the right greater trochanteric bursa and into the vastus lateralis muscle. There is also a small strain or tear of the proximal right gluteus maximus muscle. There is a subtle fracture of the third sacral segment extending into the sacral ala I to the right and left. There are subtle nondisplaced fractures of right and left pubic bodies. There are old healed fractures  of the right inferior and superior pubic rami. Urinary Tract:  No abnormality visualized. Bowel:  Numerous diverticula in distal colon.  Otherwise negative. Vascular/Lymphatic: No pathologically enlarged lymph nodes. No significant vascular abnormality seen. Reproductive:  No mass or other significant abnormality Other:  None. IMPRESSION: 1. Acute nondisplaced intertrochanteric fracture of the proximal right femur. 2. Nondisplaced subtle fractures of the S3 segment of the sacrum and of both pubic bodies. 3. Intramuscular tear and small hematoma and strain of the right gluteus maximus muscle. Small hematoma extending into the vastus lateralis muscle. Electronically Signed   By: JLorriane ShireM.D.  On: 09/27/2018 16:45   Dg Chest Port 1 View  Result Date: 09/27/2018 CLINICAL DATA:  COPD, fall EXAM: PORTABLE CHEST 1 VIEW COMPARISON:  10/09/2017 FINDINGS: Cardiac silhouette appears within normal limits. Calcific aortic knob. Diffuse interstitial thickening throughout both lungs. Prominent right suprahilar opacity, which appears more prominent compared to prior. There is hazy left basilar opacity, some of which may represent a component of hiatal hernia, better seen on prior exam. No pneumothorax is seen. The bones are diffusely demineralized. There are multiple thoracic spine compression deformity status post cement augmentation. No definite acute osseous abnormality within the limitations of this exam. IMPRESSION: 1. Prominent right suprahilar opacity, new from prior. Further evaluation with contrast-enhanced CT of the chest is recommended. 2. Hazy left basilar opacity. Electronically Signed   By: Davina Poke M.D.   On: 09/27/2018 13:59   Dg Hip Operative Unilat W Or W/o Pelvis Right  Result Date: 09/30/2018 CLINICAL DATA:  Right hip fracture ORIF. EXAM: OPERATIVE RIGHT HIP (WITH PELVIS IF PERFORMED) TECHNIQUE: Fluoroscopic spot image(s) were submitted for interpretation post-operatively. Fluoroscopy  time was 1.0 minutes. COMPARISON:  Right hip x-rays dated September 27, 2018. FINDINGS: Multiple intraoperative fluoroscopic images demonstrate interval gamma nail fixation of the right intertrochanteric femur fracture. Alignment is anatomic. IMPRESSION: 1. Intraoperative fluoroscopic guidance for right intertrochanteric femur fracture ORIF. Electronically Signed   By: Titus Dubin M.D.   On: 09/30/2018 11:28   Dg Hip Unilat With Pelvis 2-3 Views Right  Result Date: 09/27/2018 CLINICAL DATA:  73 year old female with a history of right hip pain after a fall EXAM: DG HIP (WITH OR WITHOUT PELVIS) 2-3V RIGHT COMPARISON:  None. FINDINGS: Osteopenia. Bony pelvic ring appears intact. Partially healed right inferior pubic ramus fracture. Disruption right-sided sacral foramen. Nondisplaced right intertrochanteric fracture is identified on the cross-table lateral view. Changes of prior vertebral augmentation. IMPRESSION: Nondisplaced right intertrochanteric fracture, best seen on the cross-table lateral view Questionable acute right sacral fracture with disruption of the sacral foramen. Further evaluation with pelvic MRI may be useful. Incomplete healing/remodeling of right inferior pubic ramus fracture. Osteopenia. Electronically Signed   By: Corrie Mckusick D.O.   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      - current right hilar lesion may be shifted vascular structures vs new pulmonary malignancy. Will repeat CT chest      -patient dropped from 140lbs to 105.  She states in past year she actually gained  weight appx 5lbs     - would recommend to continue chest physiotherapy with MetaNeb to recruit atelectatic segments.       -reviewed CT chest today - no new lesions, small denisty in lateral RML area likely due to post surgical changes.        -patient followed by Dr RAshby Daweson outpatient basis and will be seen by him post hospitalization   Acute exacerbation of severe bilateral bronchiectasis     - solumedrol 40 IV bid      - VEST therapy , monitor for signs of  pain for hip repair, switch to METANEB if possible      - antibiotic regiment switched to meropenem IV     - nebulized mucomyst 15m bid with albuterol      -pt moved to negative pressure room due to COVID exposure from sister who is +Covid             Thank you for allowing me to participate in the care of this patient.   Patient/Family are satisfied with care plan and all questions have been answered.  This document was prepared using Dragon voice recognition software and may include unintentional dictation errors.     FOttie Glazier M.D.  Division of [facility]

## 2018-10-02 NOTE — Consult Note (Signed)
ORTHOPAEDICS PROGRESS NOTE  PATIENT NAME: Ana Washington DOB: 03-22-1945  MRN: [id number]  POD # 2: ORIF of right intertrochanteric femur fracture.  Subjective: The patient is comfortable and states the pain is under control. Poor progress with physical therapy yesterday.  Objective: Vital signs in last 24 hours: Temp:  [97.8 F (36.6 C)-98.1 F (36.7 C)] 98.1 F (36.7 C) (09/01 0755) Pulse Rate:  [78-97] 83 (09/01 0755) Resp:  [17-19] 17 (09/01 0755) BP: (131-150)/(82-90) 150/87 (09/01 0755) SpO2:  [94 %-100 %] 100 % (09/01 0755)  Intake/Output from previous day: 08/31 0701 - 09/01 0700 In: 300 [IV Piggyback:300] Out: -   Recent Labs    09/30/18 0421 10/01/18 0318 10/02/18 0603  WBC 15.3* 11.5* 18.2*  HGB 10.7* 8.8* 8.3*  HCT 32.7* 26.7* 25.5*  PLT 223 150 209  K 4.0 4.3 5.4*  CL 89* 94* 96*  CO2 35* 35* 35*  BUN 21 16 17   CREATININE 0.67 0.58 0.50  GLUCOSE 92 171* 137*  CALCIUM 9.2 8.3* 8.7*    EXAM General: Frail-appearing female seen in no apparent discomfort. Right lower extremity: Right hip dressing is intact with scant amount of dried blood.  No significant ecchymosis or swelling to the thigh.  Homans test is negative. Neurologic: Awake, alert, and oriented.  Sensory and motor function are grossly intact.  Assessment: ORIF of right intertrochanteric femur fracture  Plan: The patient's sister apparently was COVID positive and is currently admitted at the [facility] facility. The patient was transferred to a negative pressure room and airborne precautions are being followed.  As per infectious disease, too early to repeat her COVID test. Patient is adamantly against going to Skilled Nursing, but is currently not safe given her work with physical therapy yesterday.  She was encouraged to continue efforts with physical therapy.  DVT Prophylaxis - Foot Pumps, TED hose and Eliquis  Rockie Vawter P. Holley Bouche M.D.

## 2018-10-02 NOTE — Progress Notes (Signed)
SUBJECTIVE: Feeling better   Vitals:   10/01/18 1500 10/01/18 1514 10/01/18 2359 10/02/18 0755  BP:  (!) 148/90 131/82 (!) 150/87  Pulse:  90 78 83  Resp:  19 19 17   Temp:  97.8 F (36.6 C) 97.9 F (36.6 C) 98.1 F (36.7 C)  TempSrc:  Oral  Oral  SpO2: 94% 100% 95% 100%  Weight:      Height:        Intake/Output Summary (Last 24 hours) at 10/02/2018 1032 Last data filed at 10/02/2018 0000 Gross per 24 hour  Intake 300 ml  Output -  Net 300 ml    LABS: Basic Metabolic Panel: Recent Labs    10/01/18 0318 10/02/18 0603  NA 135 137  K 4.3 5.4*  CL 94* 96*  CO2 35* 35*  GLUCOSE 171* 137*  BUN 16 17  CREATININE 0.58 0.50  CALCIUM 8.3* 8.7*  MG  --  2.2   Liver Function Tests: No results for input(s): AST, ALT, ALKPHOS, BILITOT, PROT, ALBUMIN in the last 72 hours. No results for input(s): LIPASE, AMYLASE in the last 72 hours. CBC: Recent Labs    10/01/18 0318 10/02/18 0603  WBC 11.5* 18.2*  HGB 8.8* 8.3*  HCT 26.7* 25.5*  MCV 102.7* 104.1*  PLT 150 209   Cardiac Enzymes: No results for input(s): CKTOTAL, CKMB, CKMBINDEX, TROPONINI in the last 72 hours. BNP: Invalid input(s): POCBNP D-Dimer: No results for input(s): DDIMER in the last 72 hours. Hemoglobin A1C: No results for input(s): HGBA1C in the last 72 hours. Fasting Lipid Panel: No results for input(s): CHOL, HDL, LDLCALC, TRIG, CHOLHDL, LDLDIRECT in the last 72 hours. Thyroid Function Tests: No results for input(s): TSH, T4TOTAL, T3FREE, THYROIDAB in the last 72 hours.  Invalid input(s): FREET3 Anemia Panel: No results for input(s): VITAMINB12, FOLATE, FERRITIN, TIBC, IRON, RETICCTPCT in the last 72 hours.   PHYSICAL EXAM General: Well developed, well nourished, in no acute distress HEENT:  Normocephalic and atramatic Neck:  No JVD.  Lungs: Clear bilaterally to auscultation and percussion. Heart: HRRR . Normal S1 and S2 without gallops or murmurs.  Abdomen: Bowel sounds are positive, abdomen  soft and non-tender  Msk:  Back normal, normal gait. Normal strength and tone for age. Extremities: No clubbing, cyanosis or edema.   Neuro: Alert and oriented X 3. Psych:  Good affect, responds appropriately  TELEMETRY: Sinus rhythm  ASSESSMENT AND PLAN: Status post hip fracture and treatment for it with surgery.  Patient is clinically doing fine had exposure to family member with coronavirus but so far all the to testings were negative.  Active Problems:   Closed right hip fracture (HCC)    Neoma Laming A, MD, [facility] 10/02/2018 10:32 AM

## 2018-10-02 NOTE — Progress Notes (Signed)
Physical Therapy Treatment Patient Details Name: Ana Washington MRN: [id number] DOB: 1945-07-23 Today's Date: 10/02/2018    History of Present Illness Pt is a 73 year old female admitted s/p I.M. nailing of R hip following a fall in her home.  Pt tripped over her O2 tubing.  PMH includes T10 kyphoplasty on 04/08/2018, atrial fibrillation, arthritis and anxiety.    PT Comments    Pt able to progress to standing pivot transfer today as well as better participation in bed mobility.  Pt still very anxious and requires much encouragement, though vitals did remain WNL today.  She required Max A to get to EOB, reporting sharp pain increase in R hip when attempting to bring over EOB.  Pt able to stand with mod A and close guarding, Vc's for WB and management of RW.  Pt hesitant to perform pivot transfer and PT provided assistance with management of RW and VC's for sequencing.  She was able to complete all there ex in supine and seated without difficulty.  PT provided education concerning benefit of each exercise and reassured pt that she is recovering to mobility along a normal timeline.  Pt will continue to benefit from skilled PT with focus on strength, tolerance to activity, pain management and safe functional mobility.   Follow Up Recommendations  SNF     Equipment Recommendations  Other (comment)(TBD at next venue of care.)    Recommendations for Other Services       Precautions / Restrictions Precautions Precautions: Fall Precaution Comments: High Fall risk Restrictions Weight Bearing Restrictions: Yes RLE Weight Bearing: Weight bearing as tolerated    Mobility  Bed Mobility Overal bed mobility: Needs Assistance Bed Mobility: Supine to Sit     Supine to sit: Max assist     General bed mobility comments: Bed mobility performed more smothly today but still required assistance of BLE's for pt to sit upright.  Pt able to control trunk.  Transfers Overall transfer level: Needs  assistance   Transfers: Stand Pivot Transfers   Stand pivot transfers: Mod assist       General transfer comment: Pt able to stand from elevated bedside and turning RW to sequence turns to chair.  Pt repeated "I can't do this!" and "my foot won't move" as she is stepping and will continue if motivated.  Ambulation/Gait                 Stairs             Wheelchair Mobility    Modified Rankin (Stroke Patients Only)       Balance Overall balance assessment: Needs assistance Sitting-balance support: Bilateral upper extremity supported;Feet supported Sitting balance-Leahy Scale: Fair Sitting balance - Comments: Sits with flexed posture at bedside and requires close guard.   Standing balance support: Bilateral upper extremity supported Standing balance-Leahy Scale: Fair Standing balance comment: Able to stand with RW today but requires very close CGA for safety.                            Cognition Arousal/Alertness: Awake/alert Behavior During Therapy: Restless;WFL for tasks assessed/performed Overall Cognitive Status: Within Functional Limits for tasks assessed                                 General Comments: Follows commands consistently.  Requires encouragement throughout session.  Exercises General Exercises - Lower Extremity Ankle Circles/Pumps: 20 reps;Both;Supine;AROM Quad Sets: Right;Supine;Strengthening;15 reps Gluteal Sets: Both;Seated;10 reps;Strengthening Short Arc Quad: Right;10 reps;Supine;Strengthening Heel Slides: AAROM;Right;5 reps;Supine(partial) Hip ABduction/ADduction: Strengthening;Right;Supine;10 reps    General Comments        Pertinent Vitals/Pain Pain Assessment: Faces Faces Pain Scale: Hurts little more Pain Location: R hip, did not provide a number when Pt asked. Pain Descriptors / Indicators: Grimacing;Guarding Pain Intervention(s): Monitored during session;RN gave pain meds during session     Home Living                      Prior Function            PT Goals (current goals can now be found in the care plan section) Acute Rehab PT Goals Patient Stated Goal: To get back to being able to walk on her own. PT Goal Formulation: With patient Time For Goal Achievement: 10/15/18 Potential to Achieve Goals: Good Progress towards PT goals: Progressing toward goals    Frequency    7X/week      PT Plan Current plan remains appropriate    Co-evaluation              AM-PAC PT "6 Clicks" Mobility   Outcome Measure  Help needed turning from your back to your side while in a flat bed without using bedrails?: A Lot Help needed moving from lying on your back to sitting on the side of a flat bed without using bedrails?: A Lot Help needed moving to and from a bed to a chair (including a wheelchair)?: A Lot Help needed standing up from a chair using your arms (e.g., wheelchair or bedside chair)?: A Lot Help needed to walk in hospital room?: A Lot Help needed climbing 3-5 steps with a railing? : Total 6 Click Score: 11    End of Session Equipment Utilized During Treatment: Gait belt;Oxygen Activity Tolerance: Patient limited by fatigue;Patient limited by pain Patient left: in chair;with nursing/sitter in room;with call bell/phone within reach;with bed alarm set Nurse Communication: Mobility status PT Visit Diagnosis: Unsteadiness on feet (R26.81);Muscle weakness (generalized) (M62.81);History of falling (Z91.81);Pain Pain - Right/Left: Right Pain - part of body: Hip     Time: [id number] PT Time Calculation (min) (ACUTE ONLY): 25 min  Charges:  $Therapeutic Exercise: 8-22 mins $Therapeutic Activity: 8-22 mins                     Roxanne Gates, PT, DPT    Roxanne Gates 10/02/2018, 4:19 PM

## 2018-10-02 NOTE — Progress Notes (Signed)
[facility] NAME: Ana Washington    MR#:  [id number]  DATE OF BIRTH:  December 11, 1945  SUBJECTIVE:  Patient wants to go home but she is very weak  REVIEW OF SYSTEMS:    Review of Systems  Constitutional: Negative for fever, chills weight loss HENT: Negative for ear pain, nosebleeds, congestion, facial swelling, rhinorrhea, neck pain, neck stiffness and ear discharge.   Respiratory: Negative for cough, shortness of breath, wheezing  Cardiovascular: Negative for chest pain, palpitations and leg swelling.  Gastrointestinal: Negative for heartburn, abdominal pain, vomiting, diarrhea or consitpation Genitourinary: Negative for dysuria, urgency, frequency, hematuria Musculoskeletal: Negative for back pain denies hip pain Neurological: Negative for dizziness, seizures, syncope, focal weakness,  numbness and headaches.  Hematological: Does not bruise/bleed easily.  Psychiatric/Behavioral: Negative for hallucinations, confusion, dysphoric mood    Tolerating Diet: yes     DRUG ALLERGIES:   Allergies  Allergen Reactions  . Sulfa Antibiotics Diarrhea  . Codeine Nausea And Vomiting  . Penicillins Rash    Has patient had a PCN reaction causing immediate rash, facial/tongue/throat swelling, SOB or lightheadedness with hypotension: Unknown Has patient had a PCN reaction causing severe rash involving mucus membranes or skin necrosis: Unknown Has patient had a PCN reaction that required hospitalization: Unknown Has patient had a PCN reaction occurring within the last 10 years: No If all of the above answers are "NO", then may proceed with Cephalosporin use.     VITALS:  Blood pressure (!) 150/87, pulse 83, temperature 98.1 F (36.7 C), temperature source Oral, resp. rate 17, height _0  (1.651 m), weight 47.6 kg, SpO2 100 %.  PHYSICAL EXAMINATION:  Constitutional: Appears well-developed and well-nourished. No distress. HENT:  Normocephalic. [name] Oropharynx is clear and moist.  Eyes: Conjunctivae and EOM are normal. PERRLA, no scleral icterus.  Neck: Normal ROM. Neck supple. No JVD. No tracheal deviation. CVS: RRR, S1/S2 +, no murmurs, no gallops, no carotid bruit.  Pulmonary: b/l rhonchii Abdominal: Soft. BS +,  no distension, tenderness, rebound or guarding.  Musculoskeletal: can move her legs Neuro: Alert. CN 2-12 grossly intact. No focal deficits. Skin: Skin is warm and dry. No rash noted. Psychiatric: Normal mood and affect.      LABORATORY PANEL:   CBC Recent Labs  Lab 10/02/18 0603  WBC 18.2*  HGB 8.3*  HCT 25.5*  PLT 209   ------------------------------------------------------------------------------------------------------------------  Chemistries  Recent Labs  Lab 09/27/18 1309  10/02/18 0603  NA 141   < > 137  K 3.0*   < > 5.4*  CL 93*   < > 96*  CO2 35*   < > 35*  GLUCOSE 118*   < > 137*  BUN 14   < > 17  CREATININE 0.58   < > 0.50  CALCIUM 9.3   < > 8.7*  MG  --    < > 2.2  AST 30  --   --   ALT 20  --   --   ALKPHOS 49  --   --   BILITOT 0.7  --   --    < > = values in this interval not displayed.   ------------------------------------------------------------------------------------------------------------------  Cardiac Enzymes No results for input(s): TROPONINI in the last 168 hours. ------------------------------------------------------------------------------------------------------------------  RADIOLOGY:  No results found.   ASSESSMENT AND PLAN:    73 year old female with chronic hypoxic respiratory failure on 2 L of oxygen and CAD who presented to the emergency room due to  mechanical fall and right sided hip pain.  1.  Nondisplaced right intertrochanteric fracture:  2 Day Post-Op Procedure(s) (LRB): INTRAMEDULLARY (IM) NAIL INTERTROCHANTRIC  DVT prophylaxis with Eliquis, TED hose and SCDs Weightbearing as tolerated to right leg Acute postop blood loss  with hemoglobin 8.8.  No indication for blood transfusion at this time. Continue oral iron supplement.   2.  Acute on chronic hypoxic respiratory failure with history of bronchiectasis/MAC and left lower lobe resection and acute exacerbation of severe bilateral bronchiectasis: Pulmonary consult appreciated Decrease Solu-Medrol to daily 40 mg IV  (Elevated WBC from steroids)  Continue chest physiotherapy Continue azithromycin and ciprofloxacin for prophylaxis due to history of chronic Mycobacterium AVM infection.  No evidence of pneumonia or COPD exacerbation as per pulmonary.   3.  CAD: Continue metoprolol  4.  Essential hypertension: Continue diltiazem and metoprolol Holding Norvasc and losartan due to normal blood pressure.    5.  PAF: Patient currently in normal sinus rhythm Continue Eliquis. Continue diltiazem and metoprolol for heart rate control.  6.  ESBL UTI: Continue meropenem day 3 of 5  management plans discussed with the patient and she is in agreement.  CODE STATUS: dnr  TOTAL TIME TAKING CARE OF THIS PATIENT: 25 minutes.   Patient would like to go home with home health.  Physical therapy is recommending skilled nursing facility.  She is too weak to go home  POSSIBLE D/C tomorrow DEPENDING ON CLINICAL CONDITION.   Bettey Costa M.D on 10/02/2018 at 11:37 AM  Between 7am to 6pm - Pager - [phone] After 6pm go to [email or web address] - password [email or web address] [facility]  Office  [phone]  CC: Primary care physician; [name], MD  Note: This dictation was prepared with Dragon dictation along with smaller phrase technology. Any transcriptional errors that result from this process are unintentional.

## 2018-10-03 LAB — CBC
HCT: 23.2 % — ABNORMAL LOW (ref 36.0–46.0)
Hemoglobin: 7.4 g/dL — ABNORMAL LOW (ref 12.0–15.0)
MCH: 33.2 pg (ref 26.0–34.0)
MCHC: 31.9 g/dL (ref 30.0–36.0)
MCV: 104 fL — ABNORMAL HIGH (ref 80.0–100.0)
Platelets: 213 10*3/uL (ref 150–400)
RBC: 2.23 MIL/uL — ABNORMAL LOW (ref 3.87–5.11)
RDW: 12.1 % (ref 11.5–15.5)
WBC: 12 10*3/uL — ABNORMAL HIGH (ref 4.0–10.5)
nRBC: 0 % (ref 0.0–0.2)

## 2018-10-03 LAB — BASIC METABOLIC PANEL
Anion gap: 6 (ref 5–15)
BUN: 20 mg/dL (ref 8–23)
CO2: 36 mmol/L — ABNORMAL HIGH (ref 22–32)
Calcium: 8.6 mg/dL — ABNORMAL LOW (ref 8.9–10.3)
Chloride: 94 mmol/L — ABNORMAL LOW (ref 98–111)
Creatinine, Ser: 0.51 mg/dL (ref 0.44–1.00)
GFR calc Af Amer: >60 mL/min (ref >60)
GFR calc non Af Amer: >60 mL/min (ref >60)
Glucose, Bld: 118 mg/dL — ABNORMAL HIGH (ref 70–99)
Potassium: 5 mmol/L (ref 3.5–5.1)
Sodium: 136 mmol/L (ref 135–145)

## 2018-10-03 LAB — GLUCOSE, CAPILLARY: Glucose-Capillary: 109 mg/dL — ABNORMAL HIGH (ref 70–99)

## 2018-10-03 LAB — PREPARE RBC (CROSSMATCH)

## 2018-10-03 LAB — ABO/RH: ABO/RH(D): A POS

## 2018-10-03 MED ORDER — SODIUM CHLORIDE 0.9% IV SOLUTION: Freq: Once | INTRAVENOUS | Status: DC

## 2018-10-03 NOTE — Progress Notes (Signed)
Pulmonary Medicine          Date: 10/03/2018,   MRN# [id number] Ana Washington Jul 08, 1945     AdmissionWeight: 47.6 kg                 CurrentWeight: 47.6 kg      CHIEF COMPLAINT:   Acute on chronic respiratory failure   SUBJECTIVE    Had + exposure to covid contact. Moved to neg pressure room.    She is now down to 3Lmin O2 theray  Have noted persistently decreased h/h over past few days, patient reports dark stools but is on iron.  Decreased blood likely contributing to SOB.   PAST MEDICAL HISTORY   Past Medical History:  Diagnosis Date   Arthritis    Atrial fibrillation (HCC)    Bronchiectasis (HCC)    CAD (coronary artery disease)    CAD (coronary artery disease) 07/30/2017   Dumping syndrome    Dyspnea    Dysrhythmia    afib   Essential hypertension, malignant 10/03/2013   Family history of adverse reaction to anesthesia    sister [name]   GERD (gastroesophageal reflux disease)    Headache    MIGRAINES   Hypertension    Lung disease    Myocardial infarction ([place]) 2007   Non-STEMI   [name] (postoperative nausea and vomiting)    Psoriasis    PUD (peptic ulcer disease)      SURGICAL HISTORY   Past Surgical History:  Procedure Laterality Date   BACK SURGERY     CHOLECYSTECTOMY     EYE SURGERY     FOOT SURGERY     INTRAMEDULLARY (IM) NAIL INTERTROCHANTERIC Right 09/30/2018   Procedure: INTRAMEDULLARY (IM) NAIL INTERTROCHANTRIC;  Surgeon: Dereck Leep, MD;  Location: [facility];  Service: Orthopedics;  Laterality: Right;   KYPHOPLASTY N/A 07/05/2016   Procedure: KYPHOPLASTY T - 9;  Surgeon: Hessie Knows, MD;  Location: [facility];  Service: Orthopedics;  Laterality: N/A;   KYPHOPLASTY N/A 11/29/2017   Procedure: Iona Hansen;  Surgeon: Hessie Knows, MD;  Location: [facility];  Service: Orthopedics;  Laterality: N/A;  L2 and L3   KYPHOPLASTY N/A 12/18/2017   Procedure: KYPHOPLASTY L1;  Surgeon: Hessie Knows, MD;   Location: [facility];  Service: Orthopedics;  Laterality: N/A;   KYPHOPLASTY N/A 01/05/2018   Procedure: KYPHOPLASTY-T11,T12;  Surgeon: Hessie Knows, MD;  Location: [facility];  Service: Orthopedics;  Laterality: N/A;   KYPHOPLASTY N/A 04/05/2018   Procedure: T10 KYPHOPLASTY;  Surgeon: Hessie Knows, MD;  Location: [facility];  Service: Orthopedics;  Laterality: N/A;   KYPHOPLASTY N/A 04/12/2018   Procedure: KYPHOPLASTY T7,8;  Surgeon: Hessie Knows, MD;  Location: [facility];  Service: Orthopedics;  Laterality: N/A;   KYPHOPLASTY N/A 04/19/2018   Procedure: KYPHOPLASTY T5, T6;  Surgeon: Hessie Knows, MD;  Location: [facility];  Service: Orthopedics;  Laterality: N/A;   LUNG SURGERY  1990 and 1996   THOROCOTOMY WITH LOBECTOMY     LEFT LOWER THORACOTOMY / RIGHT MIDDLE LOBECTOMY     FAMILY HISTORY   Family History  Problem Relation Age of Onset   Hypertension Mother    Hypertension Father      SOCIAL HISTORY   Social History   Tobacco Use   Smoking status: Never Smoker   Smokeless tobacco: Never Used  Substance Use Topics   Alcohol use: No   Drug use: No     MEDICATIONS    Home Medication:    Current  Medication:  Current Facility-Administered Medications:    0.9 %  sodium chloride infusion (Manually program via Guardrails IV Fluids), , Intravenous, Once, Bettey Costa, MD   acetaminophen (TYLENOL) tablet 325-650 mg, 325-650 mg, Oral, Q6H PRN, Hooten, Laurice Record, MD, 650 mg at 10/01/18 0818   acidophilus (RISAQUAD) capsule 1 capsule, 1 capsule, Oral, Daily, Hooten, Laurice Record, MD, 1 capsule at 10/03/18 2979   apixaban (ELIQUIS) tablet 5 mg, 5 mg, Oral, BID, Hooten, Laurice Record, MD, 5 mg at 10/03/18 8921   azithromycin [facility]) tablet 250 mg, 250 mg, Oral, Daily, Hooten, Laurice Record, MD, 250 mg at 10/03/18 1941   bisacodyl (DULCOLAX) suppository 10 mg, 10 mg, Rectal, Daily PRN, Hooten, Laurice Record, MD   calcium-vitamin D (OSCAL WITH D) 500-200 MG-UNIT per tablet 1 tablet, 1  tablet, Oral, Daily, Hooten, Laurice Record, MD, 1 tablet at 09/29/18 7408   chlorpheniramine-HYDROcodone (TUSSIONEX) 10-8 MG/5ML suspension 5 mL, 5 mL, Oral, QHS, Hooten, Laurice Record, MD, 5 mL at 10/02/18 2131   ciprofloxacin (CIPRO) tablet 500 mg, 500 mg, Oral, BID, Hooten, Laurice Record, MD, 500 mg at 10/03/18 1448   diltiazem (CARDIZEM CD) 24 hr capsule 120 mg, 120 mg, Oral, Daily, Hooten, Laurice Record, MD, 120 mg at 10/03/18 1856   feeding supplement (ENSURE ENLIVE) (ENSURE ENLIVE) liquid 237 mL, 237 mL, Oral, BID BM, Hooten, Laurice Record, MD, 237 mL at 10/03/18 0820   ferrous sulfate tablet 325 mg, 325 mg, Oral, Daily, Hooten, Laurice Record, MD, 325 mg at 10/02/18 1249   gabapentin (NEURONTIN) capsule 300 mg, 300 mg, Oral, QID, Hooten, Laurice Record, MD, 300 mg at 10/03/18 3149   guaiFENesin-dextromethorphan (ROBITUSSIN DM) 100-10 MG/5ML syrup 5 mL, 5 mL, Oral, Q4H PRN, Hooten, Laurice Record, MD   HYDROmorphone (DILAUDID) injection 0.5-1 mg, 0.5-1 mg, Intravenous, Q4H PRN, Hooten, Laurice Record, MD   ipratropium-albuterol (DUONEB) 0.5-2.5 (3) MG/3ML nebulizer solution 3 mL, 3 mL, Nebulization, Q6H, Lanney Gins, Kennisha Qin, MD, 3 mL at 10/03/18 0855   LORazepam (ATIVAN) tablet 0.5 mg, 0.5 mg, Oral, Q4H, Hooten, Laurice Record, MD, 0.5 mg at 10/03/18 7026   magnesium hydroxide (MILK OF MAGNESIA) suspension 30 mL, 30 mL, Oral, Daily PRN, Hooten, Laurice Record, MD, 30 mL at 10/02/18 0951   menthol-cetylpyridinium (CEPACOL) lozenge 3 mg, 1 lozenge, Oral, PRN **OR** phenol (CHLORASEPTIC) mouth spray 1 spray, 1 spray, Mouth/Throat, PRN, Hooten, Laurice Record, MD   meropenem (MERREM) 1 g in sodium chloride 0.9 % 100 mL IVPB, 1 g, Intravenous, Q12H, Dallie Piles, RPH, Last Rate: 200 mL/hr at 10/03/18 0959, 1 g at 10/03/18 0959   metoCLOPramide (REGLAN) tablet 5-10 mg, 5-10 mg, Oral, Q8H PRN **OR** metoCLOPramide (REGLAN) injection 5-10 mg, 5-10 mg, Intravenous, Q8H PRN, Hooten, Laurice Record, MD   metoprolol succinate (TOPROL-XL) 24 hr tablet 50 mg, 50 mg, Oral, QHS,  Hooten, Laurice Record, MD, 50 mg at 10/02/18 2129   multivitamin with minerals tablet 1 tablet, 1 tablet, Oral, Daily, Hooten, Laurice Record, MD, 1 tablet at 09/29/18 0953   ondansetron (ZOFRAN) tablet 4 mg, 4 mg, Oral, Q6H PRN **OR** ondansetron (ZOFRAN) injection 4 mg, 4 mg, Intravenous, Q6H PRN, Hooten, Laurice Record, MD   oxyCODONE (Oxy IR/ROXICODONE) immediate release tablet 10 mg, 10 mg, Oral, Q4H PRN, Hooten, Laurice Record, MD   oxyCODONE (Oxy IR/ROXICODONE) immediate release tablet 5 mg, 5 mg, Oral, Q4H PRN, Hooten, Laurice Record, MD   pantoprazole (PROTONIX) EC tablet 40 mg, 40 mg, Oral, Daily, Hooten, Laurice Record, MD, 40 mg at 10/03/18 [phone]  polyethylene glycol (MIRALAX / GLYCOLAX) packet 17 g, 17 g, Oral, Daily, Mody, Sital, MD, 17 g at 10/03/18 0820   predniSONE (DELTASONE) tablet 50 mg, 50 mg, Oral, Q breakfast, Ottie Glazier, MD, 50 mg at 10/03/18 8280   senna-docusate (Senokot-S) tablet 1 tablet, 1 tablet, Oral, BID, Hooten, Laurice Record, MD, 1 tablet at 10/03/18 0349   sodium chloride HYPERTONIC 3 % nebulizer solution 4 mL, 4 mL, Nebulization, Daily, Ottie Glazier, MD, 4 mL at 10/03/18 0858   sodium phosphate (FLEET) 7-19 GM/118ML enema 1 enema, 1 enema, Rectal, Once PRN, Hooten, Laurice Record, MD   sucralfate (CARAFATE) tablet 1 g, 1 g, Oral, QID, Hooten, Laurice Record, MD, 1 g at 10/03/18 0825   traMADol (ULTRAM) tablet 50-100 mg, 50-100 mg, Oral, Q4H PRN, Hooten, Laurice Record, MD, 50 mg at 10/03/18 1791   vitamin C (ASCORBIC ACID) tablet 1,000 mg, 1,000 mg, Oral, Daily, Hooten, Laurice Record, MD, 1,000 mg at 10/03/18 0820    ALLERGIES   Sulfa antibiotics, Codeine, and Penicillins     REVIEW OF SYSTEMS    Review of Systems:  Gen:  Denies  fever, sweats, chills weigh loss  HEENT: Denies blurred vision, double vision, ear pain, eye pain, hearing loss, nose bleeds, sore throat Cardiac:  No dizziness, chest pain or heaviness, chest tightness,edema Resp:   Denies cough or sputum porduction, shortness of  breath,wheezing, hemoptysis,  Gi: Denies swallowing difficulty, stomach pain, nausea or vomiting, diarrhea, constipation, bowel incontinence Gu:  Denies bladder incontinence, burning urine Ext:   Denies Joint pain, stiffness or swelling Skin: Denies  skin rash, easy bruising or bleeding or hives Endoc:  Denies polyuria, polydipsia , polyphagia or weight change Psych:   Denies depression, insomnia or hallucinations   Other:  All other systems negative   VS: BP (!) 166/95 (BP Location: Left Arm)    Pulse 76    Temp (!) 97.5 F (36.4 C) (Oral)    Resp 17    Ht _0  (1.651 m)    Wt 47.6 kg    SpO2 99%    BMI 17.47 kg/m      PHYSICAL EXAM    GENERAL:NAD, no fevers, chills, no weakness no fatigue HEAD: Normocephalic, atraumatic.  EYES: Pupils equal, round, reactive to light. Extraocular muscles intact. No scleral icterus.  MOUTH: Moist mucosal membrane. Dentition intact. No abscess noted.  EAR, NOSE, THROAT: Clear without exudates. No external lesions.  NECK: Supple. No thyromegaly. No nodules. No JVD.  PULMONARY:mild rhonchi bilaterally  CARDIOVASCULAR: S1 and S2. Regular rate and rhythm. No murmurs, rubs, or gallops. No edema. Pedal pulses 2+ bilaterally.  GASTROINTESTINAL: Soft, nontender, nondistended. No masses. Positive bowel sounds. No hepatosplenomegaly.  MUSCULOSKELETAL: No swelling, clubbing, or edema. Range of motion full in all extremities.  NEUROLOGIC: Cranial nerves II through XII are intact. No gross focal neurological deficits. Sensation intact. Reflexes intact.  SKIN: No ulceration, lesions, rashes, or cyanosis. Skin warm and dry. Turgor intact.  PSYCHIATRIC: Mood, affect within normal limits. The patient is awake, alert and oriented x 3. Insight, judgment intact.       IMAGING    Dg Chest 1 View  Result Date: 09/30/2018 CLINICAL DATA:  Hip fracture.  Preoperative exam. EXAM: CHEST  1 VIEW COMPARISON:  09/27/2018.  CT 09/28/2018. FINDINGS: Heart size is stable.  Previous pulmonary resection in the right midlung. Widespread bilateral pulmonary opacity persists, shown by CT to be secondary to widespread bronchiectasis and alveolar opacities. No radiographic change. Multiple old vertebral augmentations.  IMPRESSION: No significant radiographic change. Extensive widespread bilateral pulmonary opacities as above and discussed completely by chest CT yesterday. Electronically Signed   By: Nelson Chimes M.D.   On: 09/30/2018 08:02   Ct Chest W Contrast  Result Date: 09/28/2018 CLINICAL DATA:  Inpatient. Dyspnea. Abnormal chest radiograph with question new suprahilar opacity on the right. Clinical concern for interstitial lung disease. EXAM: CT CHEST WITH CONTRAST TECHNIQUE: Multidetector CT imaging of the chest was performed during intravenous contrast administration. CONTRAST:  70m OMNIPAQUE IOHEXOL 300 MG/ML  SOLN COMPARISON:  Chest radiograph from one day prior. 04/08/2018 chest CT angiogram. FINDINGS: Cardiovascular: Top-normal heart size. No significant pericardial effusion/thickening. Left anterior descending coronary atherosclerosis. Atherosclerotic nonaneurysmal thoracic aorta. Normal caliber main pulmonary artery. No central pulmonary emboli. Mediastinum/Nodes: No discrete thyroid nodules. Unremarkable esophagus. No axillary adenopathy. Mildly enlarged 1.3 cm low right paratracheal lymph node (series 2/image 56) and mildly enlarged 1.4 cm subcarinal node (series 2/image 67), both stable since 04/08/2018. No new pathologically enlarged mediastinal nodes. No hilar adenopathy. Lungs/Pleura: Status post left lower and right middle lobectomies. No pneumothorax. Trace dependent left pleural effusion. No right pleural effusion. There is diffuse moderate cylindrical and varicoid bronchiectasis throughout both lungs with associated diffuse bronchial wall thickening. No acute consolidative airspace disease or lung masses. Mild patchy tree-in-bud opacity and scattered mucoid  impaction associated with the areas of bronchiectasis, substantially decreased in the interval. Extensive parenchymal bands throughout the periphery of both lungs compatible with postinfectious/postinflammatory scarring, unchanged. No significant pulmonary nodules. Mild interlobular septal thickening and mosaic attenuation throughout both lungs, similar to prior. Upper abdomen: Cholecystectomy. Stable moderate intrahepatic biliary ductal dilatation. Simple 3.7 cm lateral left renal cyst. Musculoskeletal: No aggressive appearing focal osseous lesions. Multilevel thoracolumbar vertebral compression fractures from T5 through L3 with associated vertebroplasty material at each level. IMPRESSION: 1. Diffuse cylindrical and varicoid bronchiectasis in both lungs with associated bandlike scarring throughout the periphery of both lungs. Mild patchy tree-in-bud opacities and scattered mucoid impaction is significantly improved since 04/08/2018 chest CT. 2. Prior left lower and right middle lobectomy. 3. Chronic mosaic attenuation in both lungs is probably due to air trapping. 4. Mild mediastinal lymphadenopathy is stable and probably reactive. 5. One vessel coronary atherosclerosis. Aortic Atherosclerosis (ICD10-I70.0). Electronically Signed   By: JIlona SorrelM.D.   On: 09/28/2018 16:53   Mr Pelvis Wo Contrast  Result Date: 09/27/2018 CLINICAL DATA:  Right hip pain after a fall. Abnormal radiographs suggesting right sacral fracture. EXAM: MRI PELVIS WITHOUT CONTRAST TECHNIQUE: Multiplanar multisequence MR imaging of the pelvis was performed. No intravenous contrast was administered. COMPARISON:  Radiographs dated 09/27/2018 FINDINGS: Musculoskeletal: There is an acute nondisplaced intertrochanteric fracture of the proximal right femur. There is an adjacent tear an intramuscular hematoma in the right gluteus maximus muscle. Is hemorrhage into the right greater trochanteric bursa and into the vastus lateralis muscle. There  is also a small strain or tear of the proximal right gluteus maximus muscle. There is a subtle fracture of the third sacral segment extending into the sacral ala I to the right and left. There are subtle nondisplaced fractures of right and left pubic bodies. There are old healed fractures of the right inferior and superior pubic rami. Urinary Tract:  No abnormality visualized. Bowel:  Numerous diverticula in distal colon.  Otherwise negative. Vascular/Lymphatic: No pathologically enlarged lymph nodes. No significant vascular abnormality seen. Reproductive:  No mass or other significant abnormality Other:  None. IMPRESSION: 1. Acute nondisplaced intertrochanteric fracture of the proximal right femur.  2. Nondisplaced subtle fractures of the S3 segment of the sacrum and of both pubic bodies. 3. Intramuscular tear and small hematoma and strain of the right gluteus maximus muscle. Small hematoma extending into the vastus lateralis muscle. Electronically Signed   By: Lorriane Shire M.D.   On: 09/27/2018 16:45   Dg Chest Port 1 View  Result Date: 09/27/2018 CLINICAL DATA:  COPD, fall EXAM: PORTABLE CHEST 1 VIEW COMPARISON:  10/09/2017 FINDINGS: Cardiac silhouette appears within normal limits. Calcific aortic knob. Diffuse interstitial thickening throughout both lungs. Prominent right suprahilar opacity, which appears more prominent compared to prior. There is hazy left basilar opacity, some of which may represent a component of hiatal hernia, better seen on prior exam. No pneumothorax is seen. The bones are diffusely demineralized. There are multiple thoracic spine compression deformity status post cement augmentation. No definite acute osseous abnormality within the limitations of this exam. IMPRESSION: 1. Prominent right suprahilar opacity, new from prior. Further evaluation with contrast-enhanced CT of the chest is recommended. 2. Hazy left basilar opacity. Electronically Signed   By: Davina Poke M.D.   On:  09/27/2018 13:59   Dg Hip Operative Unilat W Or W/o Pelvis Right  Result Date: 09/30/2018 CLINICAL DATA:  Right hip fracture ORIF. EXAM: OPERATIVE RIGHT HIP (WITH PELVIS IF PERFORMED) TECHNIQUE: Fluoroscopic spot image(s) were submitted for interpretation post-operatively. Fluoroscopy time was 1.0 minutes. COMPARISON:  Right hip x-rays dated September 27, 2018. FINDINGS: Multiple intraoperative fluoroscopic images demonstrate interval gamma nail fixation of the right intertrochanteric femur fracture. Alignment is anatomic. IMPRESSION: 1. Intraoperative fluoroscopic guidance for right intertrochanteric femur fracture ORIF. Electronically Signed   By: Titus Dubin M.D.   On: 09/30/2018 11:28   Dg Hip Unilat With Pelvis 2-3 Views Right  Result Date: 09/27/2018 CLINICAL DATA:  73 year old female with a history of right hip pain after a fall EXAM: DG HIP (WITH OR WITHOUT PELVIS) 2-3V RIGHT COMPARISON:  None. FINDINGS: Osteopenia. Bony pelvic ring appears intact. Partially healed right inferior pubic ramus fracture. Disruption right-sided sacral foramen. Nondisplaced right intertrochanteric fracture is identified on the cross-table lateral view. Changes of prior vertebral augmentation. IMPRESSION: Nondisplaced right intertrochanteric fracture, best seen on the cross-table lateral view Questionable acute right sacral fracture with disruption of the sacral foramen. Further evaluation with pelvic MRI may be useful. Incomplete healing/remodeling of right inferior pubic ramus fracture. Osteopenia. Electronically Signed   By: Corrie Mckusick D.O.   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      - current right hilar lesion may be shifted vascular structures vs new pulmonary malignancy. Will repeat CT chest      -patient dropped from 140lbs to 105.  She states in past year she actually gained weight appx 5lbs     - would recommend to continue chest physiotherapy with MetaNeb to recruit atelectatic segments.       -reviewed CT chest today - no new lesions, small denisty in lateral RML area likely due to post surgical changes.        -  patient followed by Dr Ashby Dawes on outpatient basis and will be seen by him post hospitalization      Acute exacerbation of severe bilateral bronchiectasis     - prednisone 50      - VEST therapy , monitor for signs of pain for hip repair, switch to METANEB if possible      - antibiotic regiment switched to meropenem IV     - nebulized mucomyst 75m bid with albuterol      -pt moved to negative pressure room due to COVID exposure from sister who is +Covid             Thank you for allowing me to participate in the care of this patient.   Patient/Family are satisfied with care plan and all questions have been answered.  This document was prepared using Dragon voice recognition software and may include unintentional dictation errors.     FOttie Glazier M.D.  Division of [facility]

## 2018-10-03 NOTE — Progress Notes (Signed)
OT Cancellation Note  Patient Details Name: Ana Washington MRN: [id number] DOB: 1946/01/21   Cancelled Treatment:    Reason Eval/Treat Not Completed: Other (comment). OT made second attempt to see this pt for evaluation on this date. Upon arrival to room RN informed this OT that she was about to hang a unit of blood for this pt. Will hold OT evaluation until after transfusion is completed. Will follow acutely and re-attempt at a later time/date as pt medically appropriate for OT evaluation.   Shara Blazing, M.S., OTR/L Ascom: [phone] 10/03/18, 3:24 PM

## 2018-10-03 NOTE — Progress Notes (Signed)
OT Cancellation Note  Patient Details Name: Ana Washington MRN: [id number] DOB: [birth year]   Cancelled Treatment:    Reason Eval/Treat Not Completed: Patient at procedure or test/ unavailable. OT attempted to see pt on this date. This Pryor Curia spoke with the pt over the room phone and she was agreeable to OT evaluation on this date. Upon arrival to pt room, pt with MD in room and had additional hospital staff waiting to see. Will re-attempt at a later time as available and pt medically appropriate for OT evaluation.   Shara Blazing, M.S., OTR/L Ascom: [phone] 10/03/18, 11:04 AM

## 2018-10-03 NOTE — Progress Notes (Signed)
PT Cancellation Note  Patient Details Name: JAUNITA SZCZEPANIAK MRN: [id number] DOB: 1945/07/20   Cancelled Treatment:    Reason Eval/Treat Not Completed: Medical issues which prohibited therapy.  Pt's hemoglobin 7.4 and RN in room to start blood transfusion as PT began to enter room.  Will hold PT for today and resume tomorrow.  Roxanne Gates, PT, DPT  Roxanne Gates 10/03/2018, 4:00 PM

## 2018-10-03 NOTE — Progress Notes (Signed)
SUBJECTIVE: Mildly short of breath but feeling better since increasing cardizem to 120mg /day.   Vitals:   10/02/18 1611 10/03/18 0159 10/03/18 0820 10/03/18 0855  BP:  (!) 141/78 (!) 166/95   Pulse: 85 70 76   Resp:  16 17   Temp:  98.7 F (37.1 C) (!) 97.5 F (36.4 C)   TempSrc:  Oral Oral   SpO2: 100% 100% 100% 99%  Weight:      Height:        Intake/Output Summary (Last 24 hours) at 10/03/2018 0948 Last data filed at 10/03/2018 0535 Gross per 24 hour  Intake -  Output 1000 ml  Net -1000 ml    LABS: Basic Metabolic Panel: Recent Labs    10/02/18 0603 10/02/18 1258 10/03/18 0411  NA 137  --  136  K 5.4* 4.8 5.0  CL 96*  --  94*  CO2 35*  --  36*  GLUCOSE 137*  --  118*  BUN 17  --  20  CREATININE 0.50  --  0.51  CALCIUM 8.7*  --  8.6*  MG 2.2  --   --    Liver Function Tests: No results for input(s): AST, ALT, ALKPHOS, BILITOT, PROT, ALBUMIN in the last 72 hours. No results for input(s): LIPASE, AMYLASE in the last 72 hours. CBC: Recent Labs    10/02/18 0603 10/03/18 0411  WBC 18.2* 12.0*  HGB 8.3* 7.4*  HCT 25.5* 23.2*  MCV 104.1* 104.0*  PLT 209 213   Cardiac Enzymes: No results for input(s): CKTOTAL, CKMB, CKMBINDEX, TROPONINI in the last 72 hours. BNP: Invalid input(s): POCBNP D-Dimer: No results for input(s): DDIMER in the last 72 hours. Hemoglobin A1C: No results for input(s): HGBA1C in the last 72 hours. Fasting Lipid Panel: No results for input(s): CHOL, HDL, LDLCALC, TRIG, CHOLHDL, LDLDIRECT in the last 72 hours. Thyroid Function Tests: No results for input(s): TSH, T4TOTAL, T3FREE, THYROIDAB in the last 72 hours.  Invalid input(s): FREET3 Anemia Panel: No results for input(s): VITAMINB12, FOLATE, FERRITIN, TIBC, IRON, RETICCTPCT in the last 72 hours.   PHYSICAL EXAM General: Frail, mildly increased work of breathing. HEENT:  Normocephalic and atramatic Neck:  No JVD.  Lungs: Clear bilaterally to auscultation and percussion. Heart:  HRRR . Normal S1 and S2 without gallops or murmurs.  Abdomen: Bowel sounds are positive, abdomen soft and non-tender  Msk:  Back normal, normal gait. Normal strength and tone for age. Extremities: No clubbing, cyanosis or edema.   Neuro: Alert and oriented X 3. Psych:  Good affect, responds appropriately  TELEMETRY: Sinus rhythm  ASSESSMENT AND PLAN: Status post right intertrochanteric fracture and surgical repair, post op day 3:   Patient is subjectively feeling less short of breath on cardizem 120mg /day, continue current dose.  She had exposure to family member with coronavirus but so far all the to testings were negative.  Active Problems:   Closed right hip fracture (HCC)    Jake Bathe, MD, [facility] 10/03/2018 9:48 AM

## 2018-10-03 NOTE — Progress Notes (Signed)
   Subjective: 3 Days Post-Op Procedure(s) (LRB): INTRAMEDULLARY (IM) NAIL INTERTROCHANTRIC (Right) Patient reports pain as mild.   Patient is well, and has had no acute complaints or problems Denies any CP,  ABD pain. SOB at baseline We will continue with physical therapy today.   Objective: Vital signs in last 24 hours: Temp:  [97.5 F (36.4 C)-98.7 F (37.1 C)] 97.5 F (36.4 C) (09/02 0820) Pulse Rate:  [70-86] 76 (09/02 0820) Resp:  [16-17] 17 (09/02 0820) BP: (132-166)/(66-95) 166/95 (09/02 0820) SpO2:  [99 %-100 %] 99 % (09/02 0855)  Intake/Output from previous day: 09/01 0701 - 09/02 0700 In: -  Out: 1000 [Urine:1000] Intake/Output this shift: No intake/output data recorded.  Recent Labs    10/01/18 0318 10/02/18 0603 10/03/18 0411  HGB 8.8* 8.3* 7.4*   Recent Labs    10/02/18 0603 10/03/18 0411  WBC 18.2* 12.0*  RBC 2.45* 2.23*  HCT 25.5* 23.2*  PLT 209 213   Recent Labs    10/02/18 0603 10/02/18 1258 10/03/18 0411  NA 137  --  136  K 5.4* 4.8 5.0  CL 96*  --  94*  CO2 35*  --  36*  BUN 17  --  20  CREATININE 0.50  --  0.51  GLUCOSE 137*  --  118*  CALCIUM 8.7*  --  8.6*   No results for input(s): LABPT, INR in the last 72 hours.  EXAM General - Patient is Alert, Appropriate and Oriented Extremity - Neurovascular intact Sensation intact distally Intact pulses distally Dorsiflexion/Plantar flexion intact No cellulitis present Compartment soft Dressing - dressing C/D/I and scant drainage Motor Function - intact, moving foot and toes well on exam.   Past Medical History:  Diagnosis Date  . Arthritis   . Atrial fibrillation ([place])   . Bronchiectasis ([place])   . CAD (coronary artery disease)   . CAD (coronary artery disease) 07/30/2017  . Dumping syndrome   . Dyspnea   . Dysrhythmia    afib  . 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 Family history of adverse reaction to anesthesia    sister [name]  . GERD (gastroesophageal reflux  disease)   . Headache    MIGRAINES  . Hypertension   . Lung disease   . Myocardial infarction ([place]) 2007   Non-STEMI  . [name] (postoperative nausea and vomiting)   . Psoriasis   . PUD (peptic ulcer disease)     Assessment/Plan:   3 Days Post-Op Procedure(s) (LRB): INTRAMEDULLARY (IM) NAIL INTERTROCHANTRIC (Right) Active Problems:   Closed right hip fracture (HCC)   Acute post op blood loss anemia   Estimated body mass index is 17.47 kg/m as calculated from the following:   Height as of this encounter: 5\' 5"  (1.651 m).   Weight as of this encounter: 47.6 kg. Advance diet Up with therapy  Needs BM Acute post op blood loss anemia - Hgb 7.4. Transfuse 1 unit of PRBC  Recheck labs in the am. Pain well controlled CM to assist with discharge to SNF    Remove staples and apply steri strips on 10/14/2018 Follow up with [facility] orthopedics in 6 weeks TED hose BLE x 6 weeks, remove at night time  DVT Prophylaxis - TED hose and SCDs, Eliquis Weight-Bearing as tolerated to right leg   T. Rachelle Hora, PA-C [place] 10/03/2018, 11:42 AM

## 2018-10-03 NOTE — Care Management Important Message (Addendum)
Important Message  Patient Details  Name: AUTUMM STROHMAIER MRN: [id number] Date of Birth: March 25, 1945   Medicare Important Message Given:  Yes RN gave IM due to patient being on isolation.      Anaiz Qazi, Veronia Beets, LCSW 10/03/2018, 12:46 PM

## 2018-10-03 NOTE — Consult Note (Signed)
Pharmacy Antibiotic Note  Ana Washington is a 73 y.o. female admitted on 09/27/2018 with pneumonia and is now growing ESBL in her urine.  Pharmacy has been consulted for meropenem dosing. Patient is also receiving azithromycin and ciprofloxacin currently for prophylaxis due to history of chronic Mycobacterium AVM infection.   9/2: Day 4 of meropenem therapy. Plan for 5 days of treatment per MD note. WBC trending down and patient has been afebrile.   Plan:  Continue meropenem 1 gram IV every 12 hours  Height: 5\' 5"  (165.1 cm) Weight: 105 lb (47.6 kg) IBW/kg (Calculated) : 57  Temp (24hrs), Avg:98.2 F (36.8 C), Min:97.7 F (36.5 C), Max:98.7 F (37.1 C)  Recent Labs  Lab 09/28/18 1440 09/29/18 0415 09/30/18 0421 10/01/18 0318 10/02/18 0603 10/03/18 0411  WBC 23.1*  --  15.3* 11.5* 18.2* 12.0*  CREATININE  --  0.67 0.67 0.58 0.50 0.51    Estimated Creatinine Clearance: 46.4 mL/min (by C-G formula based on SCr of 0.51 mg/dL).    Allergies  Allergen Reactions  . Sulfa Antibiotics Diarrhea  . Codeine Nausea And Vomiting  . Penicillins Rash    Has patient had a PCN reaction causing immediate rash, facial/tongue/throat swelling, SOB or lightheadedness with hypotension: Unknown Has patient had a PCN reaction causing severe rash involving mucus membranes or skin necrosis: Unknown Has patient had a PCN reaction that required hospitalization: Unknown Has patient had a PCN reaction occurring within the last 10 years: No If all of the above answers are "NO", then may proceed with Cephalosporin use.     Antimicrobials this admission: ceftriaxone 8/27 >> 8/28 azithromycin 8/27 >>  ciprofloxacin 8/28 >> meropenem 8/30 >>  Thank you for allowing pharmacy to be a part of this patient's care.  [place] Resident 10/03/2018 8:16 AM

## 2018-10-03 NOTE — Progress Notes (Signed)
[facility] [place] NAME: Ana Washington    MR#:  [id number]  DATE OF BIRTH:  1946-01-29  SUBJECTIVE:   Patient okay about blood transfusion today denies melena or hematochezia Reports that she has 6 hours of home health care 4 days a week.  Her husband is at home. REVIEW OF SYSTEMS:    Review of Systems  Constitutional: Negative for fever, chills weight loss HENT: Negative for ear pain, nosebleeds, congestion, facial swelling, rhinorrhea, neck pain, neck stiffness and ear discharge.   Respiratory: Negative for cough, shortness of breath, wheezing  Cardiovascular: Negative for chest pain, palpitations and leg swelling.  Gastrointestinal: Negative for heartburn, abdominal pain, vomiting, diarrhea or consitpation Genitourinary: Negative for dysuria, urgency, frequency, hematuria Musculoskeletal: Negative for back pain denies hip pain Neurological: Negative for dizziness, seizures, syncope, focal weakness,  numbness and headaches.  Hematological: Does not bruise/bleed easily.  Psychiatric/Behavioral: Negative for hallucinations, confusion, dysphoric mood    Tolerating Diet: yes     DRUG ALLERGIES:   Allergies  Allergen Reactions  . Sulfa Antibiotics Diarrhea  . Codeine Nausea And Vomiting  . Penicillins Rash    Has patient had a PCN reaction causing immediate rash, facial/tongue/throat swelling, SOB or lightheadedness with hypotension: Unknown Has patient had a PCN reaction causing severe rash involving mucus membranes or skin necrosis: Unknown Has patient had a PCN reaction that required hospitalization: Unknown Has patient had a PCN reaction occurring within the last 10 years: No If all of the above answers are "NO", then may proceed with Cephalosporin use.     VITALS:  Blood pressure (!) 166/95, pulse 76, temperature (!) 97.5 F (36.4 C), temperature source Oral, resp. rate 17, height _0  (1.651 m), weight 47.6 kg, SpO2 99  %.  PHYSICAL EXAMINATION:  Constitutional: Appears well-developed and well-nourished. No distress. HENT: Normocephalic. [name] Oropharynx is clear and moist.  Eyes: Conjunctivae and EOM are normal. PERRLA, no scleral icterus.  Neck: Normal ROM. Neck supple. No JVD. No tracheal deviation. CVS: RRR, S1/S2 +, no murmurs, no gallops, no carotid bruit.  Pulmonary: b/l rhonchii Abdominal: Soft. BS +,  no distension, tenderness, rebound or guarding.  Musculoskeletal: can move her legs Neuro: Alert. CN 2-12 grossly intact. No focal deficits. Skin: Skin is warm and dry. No rash noted. Psychiatric: Normal mood and affect.      LABORATORY PANEL:   CBC Recent Labs  Lab 10/03/18 0411  WBC 12.0*  HGB 7.4*  HCT 23.2*  PLT 213   ------------------------------------------------------------------------------------------------------------------  Chemistries  Recent Labs  Lab 09/27/18 1309  10/02/18 0603  10/03/18 0411  NA 141   < > 137  --  136  K 3.0*   < > 5.4*   < > 5.0  CL 93*   < > 96*  --  94*  CO2 35*   < > 35*  --  36*  GLUCOSE 118*   < > 137*  --  118*  BUN 14   < > 17  --  20  CREATININE 0.58   < > 0.50  --  0.51  CALCIUM 9.3   < > 8.7*  --  8.6*  MG  --    < > 2.2  --   --   AST 30  --   --   --   --   ALT 20  --   --   --   --   ALKPHOS 49  --   --   --   --  BILITOT 0.7  --   --   --   --    < > = values in this interval not displayed.   ------------------------------------------------------------------------------------------------------------------  Cardiac Enzymes No results for input(s): TROPONINI in the last 168 hours. ------------------------------------------------------------------------------------------------------------------  RADIOLOGY:  No results found.   ASSESSMENT AND PLAN:    73 year old female with chronic hypoxic respiratory failure on 2 L of oxygen and CAD who presented to the emergency room due to mechanical fall and right sided hip  pain.  1.  Nondisplaced right intertrochanteric fracture:  3 Day Post-Op Procedure(s) (LRB): INTRAMEDULLARY (IM) NAIL INTERTROCHANTRIC  DVT prophylaxis with Eliquis, TED hose and SCDs Weightbearing as tolerated to right leg Acute postop blood loss with hemoglobin 7.4 this am. We will transfuse 1 unit.  Patient has consented.  Continue oral iron supplement.   2.  Acute on chronic hypoxic respiratory failure with history of bronchiectasis/MAC and left lower lobe resection and acute exacerbation of severe bilateral bronchiectasis: Pulmonary consult appreciated Wean oral steroids as tolerated. (Elevated WBC from steroids)   Continue azithromycin and ciprofloxacin for prophylaxis due to history of chronic Mycobacterium AVM infection.  No evidence of pneumonia or COPD exacerbation as per pulmonary.   3.  CAD: Continue metoprolol  4.  Essential hypertension: Continue diltiazem and metoprolol Holding Norvasc and losartan due to normal blood pressure.    5.  PAF: Patient currently in normal sinus rhythm Continue Eliquis. Continue diltiazem and metoprolol for heart rate control.  6.  ESBL UTI: Continue meropenem day 4 of 5  management plans discussed with the patient and she is in agreement.  CODE STATUS: dnr  TOTAL TIME TAKING CARE OF THIS PATIENT: 25 minutes.   Patient would like to go home with home health.  Physical therapy is recommending skilled nursing facility.  She is too weak to go home at this time.  We will continue with physical therapy while in hospital.  POSSIBLE D/C tomorrow DEPENDING ON CLINICAL CONDITION.   Bettey Costa M.D on 10/03/2018 at 10:42 AM  Between 7am to 6pm - Pager - [phone] After 6pm go to [email or web address] - password [email or web address] [facility]  Office  [phone]  CC: Primary care physician; [name], MD  Note: This dictation was prepared with Dragon dictation along with smaller phrase technology. Any transcriptional errors  that result from this process are unintentional.

## 2018-10-03 NOTE — Progress Notes (Signed)
Infection Prevention  Discussed case with Dr. Baxter Flattery Infectious Disease. In light of exposure to COVID + patient  and patient with nebulizers ordered, ID recommends continuing Airborne/contact precautions and on discharge patient should complete 14 day quarantine at home. No further visitors during hospital stay. No need for further testing unless needed for discharge to facility.

## 2018-10-04 LAB — CBC
HCT: 29.2 % — ABNORMAL LOW (ref 36.0–46.0)
Hemoglobin: 9.7 g/dL — ABNORMAL LOW (ref 12.0–15.0)
MCH: 33 pg (ref 26.0–34.0)
MCHC: 33.2 g/dL (ref 30.0–36.0)
MCV: 99.3 fL (ref 80.0–100.0)
Platelets: 226 10*3/uL (ref 150–400)
RBC: 2.94 MIL/uL — ABNORMAL LOW (ref 3.87–5.11)
RDW: 14.7 % (ref 11.5–15.5)
WBC: 11.1 10*3/uL — ABNORMAL HIGH (ref 4.0–10.5)
nRBC: 0 % (ref 0.0–0.2)

## 2018-10-04 LAB — BPAM RBC
Blood Product Expiration Date: 202009232359
ISSUE DATE / TIME: 202009021524
Unit Type and Rh: 6200

## 2018-10-04 LAB — TYPE AND SCREEN
ABO/RH(D): A POS
Antibody Screen: NEGATIVE
Unit division: 0

## 2018-10-04 MED ORDER — PROMETHAZINE HCL 25 MG/ML IJ SOLN
6.2500 mg | Freq: Once | INTRAMUSCULAR | Status: AC
  Administered 2018-10-04: 15:00:00 6.25 mg via INTRAVENOUS
  Filled 2018-10-04: qty 1

## 2018-10-04 MED ORDER — AMLODIPINE BESYLATE 5 MG PO TABS
5.0000 mg | ORAL_TABLET | Freq: Every day | ORAL | Status: DC
  Administered 2018-10-04 – 2018-10-11 (×7): 5 mg via ORAL
  Filled 2018-10-04 (×8): qty 1

## 2018-10-04 MED ORDER — IPRATROPIUM-ALBUTEROL 0.5-2.5 (3) MG/3ML IN SOLN: 3.0000 mL | Freq: Three times a day (TID) | RESPIRATORY_TRACT | Status: DC

## 2018-10-04 NOTE — Evaluation (Signed)
Occupational Therapy Evaluation Patient Details Name: Ana Washington MRN: [id number] DOB: 20-Jul-1945 Today's Date: 10/04/2018    History of Present Illness Pt is a 73 year old female admitted s/p I.M. nailing of R hip following a fall in her home.  Pt tripped over her O2 tubing.  PMH includes T10 kyphoplasty on 04/08/2018, atrial fibrillation, arthritis and anxiety.   Clinical Impression   Ms. [name] was seen for OT/PT co-evaluation/treatment on this date. Pt was generally independent in ADLs prior to surgery, however using a RW for functional mobility. She reports ambulating household distances and receiving assistance for IADL mgt including cleaning and meal prep. Is a home oxygen user. Pt is eager to return to PLOF with less pain and improved safety and independence. Pt currently requires moderate assist for LB dressing and bathing while in seated position due to pain and limited AROM of R hip as well as +2 assist for functional mobility. Pt instructed in self care skills, falls prevention strategies, and safe transfer techniques on this date. Pt would benefit from additional instruction in self care skills and techniques with or without assistive devices to support recall and carryover prior to discharge. Recommend STR upon discharge.      Follow Up Recommendations  SNF    Equipment Recommendations  Other (comment)(TBD at next venue of care.)    Recommendations for Other Services       Precautions / Restrictions Precautions Precautions: Fall Precaution Comments: High Fall risk Restrictions Weight Bearing Restrictions: Yes RLE Weight Bearing: Weight bearing as tolerated      Mobility Bed Mobility Overal bed mobility: Needs Assistance Bed Mobility: Supine to Sit     Supine to sit: Min assist     General bed mobility comments: Pt able to get to bedside with min hand held assist and assistance with initiation of R LE movement.  Able to sit at bedside with  supervision.  Transfers Overall transfer level: Needs assistance Equipment used: Rolling walker (2 wheeled) Transfers: Sit to/from Stand Sit to Stand: Min assist;+2 safety/equipment;Min guard         General transfer comment: Pt able to rise from bedside with Min A to initiate elevating hips and for stability when standing.    Balance Overall balance assessment: Needs assistance Sitting-balance support: Bilateral upper extremity supported;Feet supported Sitting balance-Leahy Scale: Good Sitting balance - Comments: Sits with flexed posture at bedside and requires close guard.   Standing balance support: Bilateral upper extremity supported Standing balance-Leahy Scale: Fair Standing balance comment: Able to stand with RW today but requires very close CGA for safety.                           ADL either performed or assessed with clinical judgement   ADL Overall ADL's : Needs assistance/impaired Eating/Feeding: Set up;Independent;Sitting   Grooming: Set up;Sitting;Supervision/safety   Upper Body Bathing: Sitting;Minimal assistance;Moderate assistance   Lower Body Bathing: Sitting/lateral leans;Moderate assistance;Maximal assistance   Upper Body Dressing : Sitting;Set up;Minimal assistance   Lower Body Dressing: Sit to/from stand;+2 for physical assistance;Moderate assistance   Toilet Transfer: +2 for physical assistance;+2 for safety/equipment;BSC;Minimal assistance;Moderate assistance;RW           Functional mobility during ADLs: +2 for physical assistance;+2 for safety/equipment;Minimal assistance General ADL Comments: Pt limited with functional mobility. Able to take 2-3 small steps twoard room chair on this date given +2 min assist     Vision  Perception     Praxis      Pertinent Vitals/Pain Pain Assessment: 0-10 Pain Score: 4  Faces Pain Scale: Hurts little more Pain Location: R hip Pain Descriptors / Indicators: Aching Pain  Intervention(s): Limited activity within patient's tolerance;Repositioned;RN gave pain meds during session     Hand Dominance Right   Extremity/Trunk Assessment Upper Extremity Assessment Upper Extremity Assessment: Generalized weakness(Grip grossly 3+/5, BUE generally week grossly 3/5 t/o.)   Lower Extremity Assessment Lower Extremity Assessment: Generalized weakness;Defer to PT evaluation RLE Coordination: decreased gross motor   Cervical / Trunk Assessment Cervical / Trunk Assessment: Kyphotic   Communication Communication Communication: HOH   Cognition Arousal/Alertness: Awake/alert Behavior During Therapy: Restless;WFL for tasks assessed/performed Overall Cognitive Status: Within Functional Limits for tasks assessed                                 General Comments: Follows commands consistently.  Requires encouragement throughout session.   General Comments  Pt BP once standing 168/100. BP taken again once seated in chair: 171/95 HR 117 with minimal exertion. Pt endorsed dizziness and SOB. Unsafe to attempt further mobility at this time.    Exercises General Exercises - Lower Extremity Ankle Circles/Pumps: 20 reps;Both;AROM;Supine Quad Sets: Both;10 reps;Supine;Strengthening Heel Slides: AROM;Both;10 reps;Supine Hip Flexion/Marching: Seated;Both;10 reps;Strengthening Other Exercises Other Exercises: Time to monitor vitals x4 min Other Exercises: Pt educated in falls prevention strategies, safe use of AE for functional mobility, and safe transfer strategies on this date. Vitals monitored t/o.   Shoulder Instructions      Home Living Family/patient expects to be discharged to:: Private residence Living Arrangements: Spouse/significant other Available Help at Discharge: Family;Personal care attendant;Available PRN/intermittently Type of Home: House Home Access: Ramped entrance     Home Layout: One level     Bathroom Shower/Tub: Animal nutritionist: Handicapped height     Home Equipment: Environmental consultant - 2 wheels          Prior Functioning/Environment Level of Independence: Needs assistance  Gait / Transfers Assistance Needed: Primarily household ambulatory with RW. ADL's / Homemaking Assistance Needed: Aid assists with ADL's.  Pt's husband is also very limited in mobility.            OT Problem List: Decreased strength;Decreased coordination;Pain;Decreased range of motion;Decreased safety awareness;Decreased activity tolerance;Decreased knowledge of use of DME or AE;Decreased knowledge of precautions;Impaired balance (sitting and/or standing);Impaired UE functional use      OT Treatment/Interventions: Self-care/ADL training;Balance training;Therapeutic exercise;Therapeutic activities;DME and/or AE instruction;Patient/family education    OT Goals(Current goals can be found in the care plan section) Acute Rehab OT Goals Patient Stated Goal: To get back to being able to walk on her own. OT Goal Formulation: With patient Time For Goal Achievement: 10/18/18 Potential to Achieve Goals: Good ADL Goals Pt Will Perform Lower Body Bathing: with supervision;with min guard assist;with set-up;sitting/lateral leans(With LRAD PRN for improved safety and functional independence) Pt Will Perform Lower Body Dressing: with adaptive equipment;sit to/from stand;with min assist(With LRAD PRN for improved safety and functional independence) Pt Will Transfer to Toilet: ambulating;bedside commode;with min guard assist;with min assist(With LRAD PRN for improved safety and functional independence)  OT Frequency: Min 1X/week   Barriers to D/C: Decreased caregiver support;Inaccessible home environment          Co-evaluation PT/OT/SLP Co-Evaluation/Treatment: Yes Reason for Co-Treatment: Complexity of the patient's impairments (multi-system involvement);To address functional/ADL transfers PT goals addressed during session:  Mobility/safety with mobility;Balance;Proper use of DME;Strengthening/ROM OT goals addressed during session: ADL's and self-care;Proper use of Adaptive equipment and DME      AM-PAC OT "6 Clicks" Daily Activity     Outcome Measure Help from another person eating meals?: A Little Help from another person taking care of personal grooming?: A Little Help from another person toileting, which includes using toliet, bedpan, or urinal?: A Lot Help from another person bathing (including washing, rinsing, drying)?: A Lot Help from another person to put on and taking off regular upper body clothing?: A Little Help from another person to put on and taking off regular lower body clothing?: A Lot 6 Click Score: 15   End of Session Equipment Utilized During Treatment: Gait belt;Rolling walker Nurse Communication: Mobility status;Other (comment)(BP with mobility attempts.)  Activity Tolerance: Patient tolerated treatment well Patient left: in chair;with call bell/phone within reach;with chair alarm set  OT Visit Diagnosis: Other abnormalities of gait and mobility (R26.89);Pain;Muscle weakness (generalized) (M62.81) Pain - Right/Left: Right Pain - part of body: Hip                Time: 1125-1200 OT Time Calculation (min): 35 min Charges:  OT General Charges $OT Visit: 1 Visit OT Evaluation $OT Eval Moderate Complexity: 1 Mod OT Treatments $Self Care/Home Management : 8-22 mins  Shara Blazing, M.S., OTR/L Ascom: [phone] 10/04/18, 2:00 PM

## 2018-10-04 NOTE — Consult Note (Signed)
Pharmacy Antibiotic Note  Ana Washington is a 73 y.o. female admitted on 09/27/2018 with pneumonia and is now growing ESBL in her urine.  Pharmacy has been consulted for meropenem dosing. Patient is also receiving azithromycin and ciprofloxacin currently for prophylaxis due to history of chronic Mycobacterium AVM infection.   9/3: Day 5 of meropenem therapy. Final day of treatment for ESBL UTI per MD. WBC trending down and patient has been afebrile.   Plan:  Continue meropenem 1 gram IV every 12 hours and stop after today  Height: 5\' 5"  (165.1 cm) Weight: 105 lb (47.6 kg) IBW/kg (Calculated) : 57  Temp (24hrs), Avg:97.8 F (36.6 C), Min:97.3 F (36.3 C), Max:98.1 F (36.7 C)  Recent Labs  Lab 09/29/18 0415 09/30/18 0421 10/01/18 0318 10/02/18 0603 10/03/18 0411 10/04/18 0552  WBC  --  15.3* 11.5* 18.2* 12.0* 11.1*  CREATININE 0.67 0.67 0.58 0.50 0.51  --     Estimated Creatinine Clearance: 46.4 mL/min (by C-G formula based on SCr of 0.51 mg/dL).    Allergies  Allergen Reactions  . Sulfa Antibiotics Diarrhea  . Codeine Nausea And Vomiting  . Penicillins Rash    Has patient had a PCN reaction causing immediate rash, facial/tongue/throat swelling, SOB or lightheadedness with hypotension: Unknown Has patient had a PCN reaction causing severe rash involving mucus membranes or skin necrosis: Unknown Has patient had a PCN reaction that required hospitalization: Unknown Has patient had a PCN reaction occurring within the last 10 years: No If all of the above answers are "NO", then may proceed with Cephalosporin use.     Antimicrobials this admission: ceftriaxone 8/27 >> 8/28 azithromycin 8/27 >>  ciprofloxacin 8/28 >> meropenem 8/30 >> 9/3  Thank you for allowing pharmacy to be a part of this patient's care.  [place] Resident 10/04/2018 11:22 AM

## 2018-10-04 NOTE — Progress Notes (Signed)
Pulmonary Medicine          Date: 10/04/2018,   MRN# [id number] ARLIE POSCH [birth year]     AdmissionWeight: 47.6 kg                 CurrentWeight: 47.6 kg      CHIEF COMPLAINT:   Acute on chronic respiratory failure   SUBJECTIVE    Had + exposure to covid contact. Moved to neg pressure room.    She is now down to 74Lmin O2 theray  Resting in chair comfortably.   PAST MEDICAL HISTORY   Past Medical History:  Diagnosis Date   Arthritis    Atrial fibrillation (HCC)    Bronchiectasis (HCC)    CAD (coronary artery disease)    CAD (coronary artery disease) 07/30/2017   Dumping syndrome    Dyspnea    Dysrhythmia    afib   Essential hypertension, malignant 10/03/2013   Family history of adverse reaction to anesthesia    sister [name]   GERD (gastroesophageal reflux disease)    Headache    MIGRAINES   Hypertension    Lung disease    Myocardial infarction ([place]) 2007   Non-STEMI   [name] (postoperative nausea and vomiting)    Psoriasis    PUD (peptic ulcer disease)      SURGICAL HISTORY   Past Surgical History:  Procedure Laterality Date   BACK SURGERY     CHOLECYSTECTOMY     EYE SURGERY     FOOT SURGERY     INTRAMEDULLARY (IM) NAIL INTERTROCHANTERIC Right 09/30/2018   Procedure: INTRAMEDULLARY (IM) NAIL INTERTROCHANTRIC;  Surgeon: Dereck Leep, MD;  Location: [facility];  Service: Orthopedics;  Laterality: Right;   KYPHOPLASTY N/A 07/05/2016   Procedure: KYPHOPLASTY T - 9;  Surgeon: Hessie Knows, MD;  Location: [facility];  Service: Orthopedics;  Laterality: N/A;   KYPHOPLASTY N/A 11/29/2017   Procedure: Iona Hansen;  Surgeon: Hessie Knows, MD;  Location: [facility];  Service: Orthopedics;  Laterality: N/A;  L2 and L3   KYPHOPLASTY N/A 12/18/2017   Procedure: KYPHOPLASTY L1;  Surgeon: Hessie Knows, MD;  Location: [facility];  Service: Orthopedics;  Laterality: N/A;   KYPHOPLASTY N/A 01/05/2018   Procedure:  KYPHOPLASTY-T11,T12;  Surgeon: Hessie Knows, MD;  Location: [facility];  Service: Orthopedics;  Laterality: N/A;   KYPHOPLASTY N/A 04/05/2018   Procedure: T10 KYPHOPLASTY;  Surgeon: Hessie Knows, MD;  Location: [facility];  Service: Orthopedics;  Laterality: N/A;   KYPHOPLASTY N/A 04/12/2018   Procedure: KYPHOPLASTY T7,8;  Surgeon: Hessie Knows, MD;  Location: [facility];  Service: Orthopedics;  Laterality: N/A;   KYPHOPLASTY N/A 04/19/2018   Procedure: KYPHOPLASTY T5, T6;  Surgeon: Hessie Knows, MD;  Location: [facility];  Service: Orthopedics;  Laterality: N/A;   LUNG SURGERY  1990 and 1996   THOROCOTOMY WITH LOBECTOMY     LEFT LOWER THORACOTOMY / RIGHT MIDDLE LOBECTOMY     FAMILY HISTORY   Family History  Problem Relation Age of Onset   Hypertension Mother    Hypertension Father      SOCIAL HISTORY   Social History   Tobacco Use   Smoking status: Never Smoker   Smokeless tobacco: Never Used  Substance Use Topics   Alcohol use: No   Drug use: No     MEDICATIONS    Home Medication:    Current Medication:  Current Facility-Administered Medications:    0.9 %  sodium chloride infusion (Manually program via Guardrails IV Fluids), ,  Intravenous, Once, Bettey Costa, MD   acetaminophen (TYLENOL) tablet 325-650 mg, 325-650 mg, Oral, Q6H PRN, Hooten, Laurice Record, MD, 650 mg at 10/01/18 0818   acidophilus (RISAQUAD) capsule 1 capsule, 1 capsule, Oral, Daily, Hooten, Laurice Record, MD, 1 capsule at 10/04/18 1127   amLODipine (NORVASC) tablet 5 mg, 5 mg, Oral, Daily, Mody, Sital, MD, 5 mg at 10/04/18 1426   apixaban (ELIQUIS) tablet 5 mg, 5 mg, Oral, BID, Hooten, Laurice Record, MD, 5 mg at 10/04/18 1126   azithromycin (ZITHROMAX) tablet 250 mg, 250 mg, Oral, Daily, Hooten, Laurice Record, MD, 250 mg at 10/04/18 1126   bisacodyl (DULCOLAX) suppository 10 mg, 10 mg, Rectal, Daily PRN, Hooten, Laurice Record, MD   calcium-vitamin D (OSCAL WITH D) 500-200 MG-UNIT per tablet 1 tablet, 1 tablet, Oral,  Daily, Hooten, Laurice Record, MD, 1 tablet at 10/04/18 1427   chlorpheniramine-HYDROcodone (TUSSIONEX) 10-8 MG/5ML suspension 5 mL, 5 mL, Oral, QHS, Hooten, Laurice Record, MD, 5 mL at 10/03/18 2132   ciprofloxacin (CIPRO) tablet 500 mg, 500 mg, Oral, BID, Hooten, Laurice Record, MD, 500 mg at 10/04/18 0836   diltiazem (CARDIZEM CD) 24 hr capsule 120 mg, 120 mg, Oral, Daily, Hooten, Laurice Record, MD, 120 mg at 10/04/18 1128   feeding supplement (ENSURE ENLIVE) (ENSURE ENLIVE) liquid 237 mL, 237 mL, Oral, BID BM, Hooten, Laurice Record, MD, 237 mL at 10/04/18 1128   ferrous sulfate tablet 325 mg, 325 mg, Oral, Daily, Hooten, Laurice Record, MD, 325 mg at 10/03/18 1731   gabapentin (NEURONTIN) capsule 300 mg, 300 mg, Oral, QID, Hooten, Laurice Record, MD, 300 mg at 10/04/18 1426   guaiFENesin-dextromethorphan (ROBITUSSIN DM) 100-10 MG/5ML syrup 5 mL, 5 mL, Oral, Q4H PRN, Hooten, Laurice Record, MD   HYDROmorphone (DILAUDID) injection 0.5-1 mg, 0.5-1 mg, Intravenous, Q4H PRN, Hooten, Laurice Record, MD, 0.5 mg at 10/04/18 1318   ipratropium-albuterol (DUONEB) 0.5-2.5 (3) MG/3ML nebulizer solution 3 mL, 3 mL, Nebulization, Q6H, Brendalee Matthies, MD, 3 mL at 10/04/18 1502   LORazepam (ATIVAN) tablet 0.5 mg, 0.5 mg, Oral, Q4H, Hooten, Laurice Record, MD, 0.5 mg at 10/04/18 1426   magnesium hydroxide (MILK OF MAGNESIA) suspension 30 mL, 30 mL, Oral, Daily PRN, Hooten, Laurice Record, MD, 30 mL at 10/02/18 0951   menthol-cetylpyridinium (CEPACOL) lozenge 3 mg, 1 lozenge, Oral, PRN **OR** phenol (CHLORASEPTIC) mouth spray 1 spray, 1 spray, Mouth/Throat, PRN, Hooten, Laurice Record, MD   meropenem (MERREM) 1 g in sodium chloride 0.9 % 100 mL IVPB, 1 g, Intravenous, Q12H, Mody, Sital, MD, Last Rate: 200 mL/hr at 10/03/18 2138, 1 g at 10/03/18 2138   metoCLOPramide (REGLAN) tablet 5-10 mg, 5-10 mg, Oral, Q8H PRN **OR** metoCLOPramide (REGLAN) injection 5-10 mg, 5-10 mg, Intravenous, Q8H PRN, Hooten, Laurice Record, MD   metoprolol succinate (TOPROL-XL) 24 hr tablet 50 mg, 50 mg, Oral,  QHS, Hooten, Laurice Record, MD, 50 mg at 10/03/18 2131   multivitamin with minerals tablet 1 tablet, 1 tablet, Oral, Daily, Hooten, Laurice Record, MD, 1 tablet at 10/03/18 1731   ondansetron (ZOFRAN) tablet 4 mg, 4 mg, Oral, Q6H PRN **OR** ondansetron (ZOFRAN) injection 4 mg, 4 mg, Intravenous, Q6H PRN, Hooten, Laurice Record, MD   oxyCODONE (Oxy IR/ROXICODONE) immediate release tablet 10 mg, 10 mg, Oral, Q4H PRN, Hooten, Laurice Record, MD   oxyCODONE (Oxy IR/ROXICODONE) immediate release tablet 5 mg, 5 mg, Oral, Q4H PRN, Hooten, Laurice Record, MD   pantoprazole (PROTONIX) EC tablet 40 mg, 40 mg, Oral, Daily, Hooten, Laurice Record, MD, 40 mg at  10/04/18 1126   polyethylene glycol (MIRALAX / GLYCOLAX) packet 17 g, 17 g, Oral, Daily, Mody, Sital, MD, 17 g at 10/04/18 1127   senna-docusate (Senokot-S) tablet 1 tablet, 1 tablet, Oral, BID, Hooten, Laurice Record, MD, 1 tablet at 10/04/18 1127   sodium chloride HYPERTONIC 3 % nebulizer solution 4 mL, 4 mL, Nebulization, Daily, Lanney Gins, Franciso Dierks, MD, 4 mL at 10/04/18 0905   sodium phosphate (FLEET) 7-19 GM/118ML enema 1 enema, 1 enema, Rectal, Once PRN, Hooten, Laurice Record, MD   sucralfate (CARAFATE) tablet 1 g, 1 g, Oral, QID, Hooten, Laurice Record, MD, 1 g at 10/04/18 1125   traMADol (ULTRAM) tablet 50-100 mg, 50-100 mg, Oral, Q4H PRN, Hooten, Laurice Record, MD, 50 mg at 10/04/18 1234   vitamin C (ASCORBIC ACID) tablet 1,000 mg, 1,000 mg, Oral, Daily, Hooten, Laurice Record, MD, 1,000 mg at 10/04/18 1126    ALLERGIES   Sulfa antibiotics, Codeine, and Penicillins     REVIEW OF SYSTEMS    Review of Systems:  Gen:  Denies  fever, sweats, chills weigh loss  HEENT: Denies blurred vision, double vision, ear pain, eye pain, hearing loss, nose bleeds, sore throat Cardiac:  No dizziness, chest pain or heaviness, chest tightness,edema Resp:   Denies cough or sputum porduction, shortness of breath,wheezing, hemoptysis,  Gi: Denies swallowing difficulty, stomach pain, nausea or vomiting, diarrhea,  constipation, bowel incontinence Gu:  Denies bladder incontinence, burning urine Ext:   Denies Joint pain, stiffness or swelling Skin: Denies  skin rash, easy bruising or bleeding or hives Endoc:  Denies polyuria, polydipsia , polyphagia or weight change Psych:   Denies depression, insomnia or hallucinations   Other:  All other systems negative   VS: BP (!) 166/101    Pulse 86    Temp 97.8 F (36.6 C)    Resp 17    Ht _0  (1.651 m)    Wt 47.6 kg    SpO2 99%    BMI 17.47 kg/m      PHYSICAL EXAM    GENERAL:NAD, no fevers, chills, no weakness no fatigue HEAD: Normocephalic, atraumatic.  EYES: Pupils equal, round, reactive to light. Extraocular muscles intact. No scleral icterus.  MOUTH: Moist mucosal membrane. Dentition intact. No abscess noted.  EAR, NOSE, THROAT: Clear without exudates. No external lesions.  NECK: Supple. No thyromegaly. No nodules. No JVD.  PULMONARY:mild rhonchi bilaterally  CARDIOVASCULAR: S1 and S2. Regular rate and rhythm. No murmurs, rubs, or gallops. No edema. Pedal pulses 2+ bilaterally.  GASTROINTESTINAL: Soft, nontender, nondistended. No masses. Positive bowel sounds. No hepatosplenomegaly.  MUSCULOSKELETAL: No swelling, clubbing, or edema. Range of motion full in all extremities.  NEUROLOGIC: Cranial nerves II through XII are intact. No gross focal neurological deficits. Sensation intact. Reflexes intact.  SKIN: No ulceration, lesions, rashes, or cyanosis. Skin warm and dry. Turgor intact.  PSYCHIATRIC: Mood, affect within normal limits. The patient is awake, alert and oriented x 3. Insight, judgment intact.       IMAGING    Dg Chest 1 View  Result Date: 09/30/2018 CLINICAL DATA:  Hip fracture.  Preoperative exam. EXAM: CHEST  1 VIEW COMPARISON:  09/27/2018.  CT 09/28/2018. FINDINGS: Heart size is stable. Previous pulmonary resection in the right midlung. Widespread bilateral pulmonary opacity persists, shown by CT to be secondary to widespread  bronchiectasis and alveolar opacities. No radiographic change. Multiple old vertebral augmentations. IMPRESSION: No significant radiographic change. Extensive widespread bilateral pulmonary opacities as above and discussed completely by chest CT yesterday. Electronically Signed  By: Nelson Chimes M.D.   On: 09/30/2018 08:02   Ct Chest W Contrast  Result Date: 09/28/2018 CLINICAL DATA:  Inpatient. Dyspnea. Abnormal chest radiograph with question new suprahilar opacity on the right. Clinical concern for interstitial lung disease. EXAM: CT CHEST WITH CONTRAST TECHNIQUE: Multidetector CT imaging of the chest was performed during intravenous contrast administration. CONTRAST:  70m OMNIPAQUE IOHEXOL 300 MG/ML  SOLN COMPARISON:  Chest radiograph from one day prior. 04/08/2018 chest CT angiogram. FINDINGS: Cardiovascular: Top-normal heart size. No significant pericardial effusion/thickening. Left anterior descending coronary atherosclerosis. Atherosclerotic nonaneurysmal thoracic aorta. Normal caliber main pulmonary artery. No central pulmonary emboli. Mediastinum/Nodes: No discrete thyroid nodules. Unremarkable esophagus. No axillary adenopathy. Mildly enlarged 1.3 cm low right paratracheal lymph node (series 2/image 56) and mildly enlarged 1.4 cm subcarinal node (series 2/image 67), both stable since 04/08/2018. No new pathologically enlarged mediastinal nodes. No hilar adenopathy. Lungs/Pleura: Status post left lower and right middle lobectomies. No pneumothorax. Trace dependent left pleural effusion. No right pleural effusion. There is diffuse moderate cylindrical and varicoid bronchiectasis throughout both lungs with associated diffuse bronchial wall thickening. No acute consolidative airspace disease or lung masses. Mild patchy tree-in-bud opacity and scattered mucoid impaction associated with the areas of bronchiectasis, substantially decreased in the interval. Extensive parenchymal bands throughout the  periphery of both lungs compatible with postinfectious/postinflammatory scarring, unchanged. No significant pulmonary nodules. Mild interlobular septal thickening and mosaic attenuation throughout both lungs, similar to prior. Upper abdomen: Cholecystectomy. Stable moderate intrahepatic biliary ductal dilatation. Simple 3.7 cm lateral left renal cyst. Musculoskeletal: No aggressive appearing focal osseous lesions. Multilevel thoracolumbar vertebral compression fractures from T5 through L3 with associated vertebroplasty material at each level. IMPRESSION: 1. Diffuse cylindrical and varicoid bronchiectasis in both lungs with associated bandlike scarring throughout the periphery of both lungs. Mild patchy tree-in-bud opacities and scattered mucoid impaction is significantly improved since 04/08/2018 chest CT. 2. Prior left lower and right middle lobectomy. 3. Chronic mosaic attenuation in both lungs is probably due to air trapping. 4. Mild mediastinal lymphadenopathy is stable and probably reactive. 5. One vessel coronary atherosclerosis. Aortic Atherosclerosis (ICD10-I70.0). Electronically Signed   By: JIlona SorrelM.D.   On: 09/28/2018 16:53   Mr Pelvis Wo Contrast  Result Date: 09/27/2018 CLINICAL DATA:  Right hip pain after a fall. Abnormal radiographs suggesting right sacral fracture. EXAM: MRI PELVIS WITHOUT CONTRAST TECHNIQUE: Multiplanar multisequence MR imaging of the pelvis was performed. No intravenous contrast was administered. COMPARISON:  Radiographs dated 09/27/2018 FINDINGS: Musculoskeletal: There is an acute nondisplaced intertrochanteric fracture of the proximal right femur. There is an adjacent tear an intramuscular hematoma in the right gluteus maximus muscle. Is hemorrhage into the right greater trochanteric bursa and into the vastus lateralis muscle. There is also a small strain or tear of the proximal right gluteus maximus muscle. There is a subtle fracture of the third sacral segment  extending into the sacral ala I to the right and left. There are subtle nondisplaced fractures of right and left pubic bodies. There are old healed fractures of the right inferior and superior pubic rami. Urinary Tract:  No abnormality visualized. Bowel:  Numerous diverticula in distal colon.  Otherwise negative. Vascular/Lymphatic: No pathologically enlarged lymph nodes. No significant vascular abnormality seen. Reproductive:  No mass or other significant abnormality Other:  None. IMPRESSION: 1. Acute nondisplaced intertrochanteric fracture of the proximal right femur. 2. Nondisplaced subtle fractures of the S3 segment of the sacrum and of both pubic bodies. 3. Intramuscular tear and small hematoma and  strain of the right gluteus maximus muscle. Small hematoma extending into the vastus lateralis muscle. Electronically Signed   By: Lorriane Shire M.D.   On: 09/27/2018 16:45   Dg Chest Port 1 View  Result Date: 09/27/2018 CLINICAL DATA:  COPD, fall EXAM: PORTABLE CHEST 1 VIEW COMPARISON:  10/09/2017 FINDINGS: Cardiac silhouette appears within normal limits. Calcific aortic knob. Diffuse interstitial thickening throughout both lungs. Prominent right suprahilar opacity, which appears more prominent compared to prior. There is hazy left basilar opacity, some of which may represent a component of hiatal hernia, better seen on prior exam. No pneumothorax is seen. The bones are diffusely demineralized. There are multiple thoracic spine compression deformity status post cement augmentation. No definite acute osseous abnormality within the limitations of this exam. IMPRESSION: 1. Prominent right suprahilar opacity, new from prior. Further evaluation with contrast-enhanced CT of the chest is recommended. 2. Hazy left basilar opacity. Electronically Signed   By: Davina Poke M.D.   On: 09/27/2018 13:59   Dg Hip Operative Unilat W Or W/o Pelvis Right  Result Date: 09/30/2018 CLINICAL DATA:  Right hip fracture ORIF.  EXAM: OPERATIVE RIGHT HIP (WITH PELVIS IF PERFORMED) TECHNIQUE: Fluoroscopic spot image(s) were submitted for interpretation post-operatively. Fluoroscopy time was 1.0 minutes. COMPARISON:  Right hip x-rays dated September 27, 2018. FINDINGS: Multiple intraoperative fluoroscopic images demonstrate interval gamma nail fixation of the right intertrochanteric femur fracture. Alignment is anatomic. IMPRESSION: 1. Intraoperative fluoroscopic guidance for right intertrochanteric femur fracture ORIF. Electronically Signed   By: Titus Dubin M.D.   On: 09/30/2018 11:28   Dg Hip Unilat With Pelvis 2-3 Views Right  Result Date: 09/27/2018 CLINICAL DATA:  73 year old female with a history of right hip pain after a fall EXAM: DG HIP (WITH OR WITHOUT PELVIS) 2-3V RIGHT COMPARISON:  None. FINDINGS: Osteopenia. Bony pelvic ring appears intact. Partially healed right inferior pubic ramus fracture. Disruption right-sided sacral foramen. Nondisplaced right intertrochanteric fracture is identified on the cross-table lateral view. Changes of prior vertebral augmentation. IMPRESSION: Nondisplaced right intertrochanteric fracture, best seen on the cross-table lateral view Questionable acute right sacral fracture with disruption of the sacral foramen. Further evaluation with pelvic MRI may be useful. Incomplete healing/remodeling of right inferior pubic ramus fracture. Osteopenia. Electronically Signed   By: Corrie Mckusick D.O.   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      - current right hilar lesion may be shifted vascular  structures vs new pulmonary malignancy. Will repeat CT chest      -patient dropped from 140lbs to 105.  She states in past year she actually gained weight appx 5lbs     - would recommend to continue chest physiotherapy with MetaNeb to recruit atelectatic segments.       -reviewed CT  - no new lesions, small denisty in lateral RML area likely due to post surgical changes.        -patient followed by Dr RAshby Daweson outpatient basis and will be seen by him post hospitalization  Acute exacerbation of severe bilateral bronchiectasis     - prednisone has been stopped       - VEST therapy , monitor for signs of pain for hip repair, switch to METANEB if possible      - antibiotic regiment switched to meropenem IV     - nebulized mucomyst 85m bid with albuterol      -pt moved to negative pressure room due to COVID exposure from sister who is +Covid       -d/c home in am as per Dr MBenjie Karvonenif clinically appropriate       Thank you for allowing me to participate in the care of this patient.   Patient/Family are satisfied with care plan and all questions have been answered.  This document was prepared using Dragon voice recognition software and may include unintentional dictation errors.     FOttie Glazier M.D.  Division of [facility]

## 2018-10-04 NOTE — Progress Notes (Signed)
Initial Nutrition Assessment  DOCUMENTATION CODES:   Underweight  INTERVENTION:   Ensure Enlive po BID, each supplement provides 350 kcal and 20 grams of protein  Magic cup TID with meals, each supplement provides 290 kcal and 9 grams of protein  MVI daily  NUTRITION DIAGNOSIS:   Increased nutrient needs related to hip fracture as evidenced by increased estimated needs.  GOAL:   Patient will meet greater than or equal to 90% of their needs  MONITOR:   PO intake, Supplement acceptance, Labs, Weight trends, Skin, I & O's  REASON FOR ASSESSMENT:   Other (Comment)(Low BMI)    ASSESSMENT:   73 y/o female with hx of CAD, COPD, MAC and bronchiectasis s/p RLL/RML resection with LLL resection, AF, HTN, AF, GERD, MID, PUD admitted s/p mechanical fall with subsequent right hip fracture. CXR also showed RUL infiltrate.  RD working remotely.  Pt familiar to nutrition department from recent previous admit. Pt with poor appetite and oral intake at baseline. Pt refusing supplements during her last admit; pt was willing to try Boost Breeze and orange Magic Cups.  Pt currently eating <75% of meals. Pt is drinking some Ensure; RD will leave this ordered for now but can switch to Boost Breeze if needed. Per chart, pt is weight stable pta.   Pt is at high risk for malnutrition but unable to diagnose at this time as NFPE cannot be performed.    Medications reviewed and include: risaquad, azithromycin, oscal w/ D, ciprofloxacin, ferrous sulfate, protonix, miralax, senokot, carafate, vitamin C, meropenem  Labs reviewed: wbc- 11.1(H), Hgb 9.7(L), Hct 29.2(L)  Unable to complete Nutrition-Focused physical exam at this time.   Diet Order:   Diet Order            Diet regular Room service appropriate? Yes; Fluid consistency: Thin  Diet effective now             EDUCATION NEEDS:   Education needs have been addressed  Skin:  Skin Assessment: Reviewed RN Assessment(Incision back,  ecchymosis)  Last BM:  9/2- TYPE 7  Height:   Ht Readings from Last 1 Encounters:  09/27/18 _0  (1.651 m)    Weight:   Wt Readings from Last 1 Encounters:  09/27/18 47.6 kg    Ideal Body Weight:  56.8 kg  BMI:  Body mass index is 17.47 kg/m.  Estimated Nutritional Needs:   Kcal:  1300-1500kcal/day  Protein:  65-75g/day  Fluid:  >1.3L/day  Koleen Distance MS, RD, LDN Pager #- [phone] Office#- [phone] After Hours Pager: [phone]

## 2018-10-04 NOTE — Progress Notes (Signed)
Pt was sitting up in the chair. She was given some pain medication. She is sleeping and looks comfortable. Chest vest was not done at this time.

## 2018-10-04 NOTE — Progress Notes (Signed)
Physical Therapy Treatment Patient Details Name: Ana Washington MRN: [id number] DOB: 24-Jan-1946 Today's Date: 10/04/2018    History of Present Illness Pt is a 73 year old female admitted s/p I.M. nailing of R hip following a fall in her home.  Pt tripped over her O2 tubing.  PMH includes T10 kyphoplasty on 04/08/2018, atrial fibrillation, arthritis and anxiety.    PT Comments    Pt reported 10/10 pain upon PT entering room and exclaiming "Come and look at my leg!  It is swollen and hurts so bad.".  PT observed an effused area just above pt's knee as well as spasms of the quadriceps.  PT guided pt through gentle there ex to alleviate spasms and pt was apprehensive throughout.  Spasmodic activity appeared to decrease with quad sets.  Pt requested pain medication and RN in to administer.  Pt became very lethargic almost immediately after administration and was not able to be aroused.  PT ensured pt was positioned for comfort prior to leaving room, pt's O2 and Purewick in place. Pt will continue to benefit from skilled PT with focus on strength, tolerance to activity, pain management and safe functional mobility.  Follow Up Recommendations  SNF     Equipment Recommendations  Other (comment)(TBD at next venue of care.)    Recommendations for Other Services       Precautions / Restrictions Precautions Precautions: Fall Precaution Comments: High Fall risk Restrictions Weight Bearing Restrictions: Yes RLE Weight Bearing: Weight bearing as tolerated    Mobility  Bed Mobility Overal bed mobility: (No mobility performed.) Bed Mobility: Supine to Sit     Supine to sit: Min assist     General bed mobility comments: Pt able to get to bedside with min hand held assist and assistance with initiation of R LE movement.  Able to sit at bedside with supervision.  Transfers Overall transfer level: Needs assistance Equipment used: Rolling walker (2 wheeled) Transfers: Sit to/from Stand Sit to  Stand: Min assist;+2 safety/equipment;Min guard         General transfer comment: Pt able to rise from bedside with Min A to initiate elevating hips and for stability when standing.  Ambulation/Gait Ambulation/Gait assistance: Min assist Gait Distance (Feet): 4 Feet Assistive device: Rolling walker (2 wheeled)     Gait velocity interpretation: <1.8 ft/sec, indicate of risk for recurrent falls General Gait Details: Very low foot clearance, step length.  Encouragement needed to advance L LE and WB on R LE.  Pt able to manage RW without assistance.  Pt became tachycardic (HR 117) and reported weakness, immediately moved to chair.   Stairs             Wheelchair Mobility    Modified Rankin (Stroke Patients Only)       Balance Overall balance assessment: Needs assistance Sitting-balance support: Bilateral upper extremity supported;Feet supported Sitting balance-Leahy Scale: Good Sitting balance - Comments: Sits with flexed posture at bedside and requires close guard.   Standing balance support: Bilateral upper extremity supported Standing balance-Leahy Scale: Fair Standing balance comment: Able to stand with RW today but requires very close CGA for safety.                            Cognition Arousal/Alertness: Lethargic Behavior During Therapy: Restless;Anxious Overall Cognitive Status: Within Functional Limits for tasks assessed  General Comments: Follows commands consistently.  Requires encouragement throughout session.      Exercises General Exercises - Lower Extremity Ankle Circles/Pumps: 20 reps;Both;AROM;Supine Quad Sets: Both;10 reps;Supine;Strengthening Heel Slides: AROM;Both;10 reps;Supine Hip Flexion/Marching: Seated;Both;10 reps;Strengthening Other Exercises Other Exercises: Time to assess R LE: effusion noted superolateral to patella with spasms of quadriceps and pt reporting 10/10 pain.  No  redness or warmth to touch. x3 min Other Exercises: Encouraged quad sets and gentle heel slides, SAQ with manual cues, all of which pt was apprehensive to perform. x7 min Other Exercises: RN in to administer pain medication after which pt became very lethargic.    General Comments General comments (skin integrity, edema, etc.): Pt BP once standing 168/100. BP taken again once seated in chair: 171/95 HR 117 with minimal exertion. Pt endorsed dizziness and SOB. Unsafe to attempt further mobility at this time.      Pertinent Vitals/Pain Pain Assessment: 0-10 Pain Score: 10-Worst pain ever Faces Pain Scale: Hurts little more Pain Location: R quadriceps Pain Descriptors / Indicators: Aching;Guarding;Spasm Pain Intervention(s): Monitored during session;RN gave pain meds during session    Home Living Family/patient expects to be discharged to:: Private residence Living Arrangements: Spouse/significant other Available Help at Discharge: Family;Personal care attendant;Available PRN/intermittently Type of Home: House Home Access: Ramped entrance   Home Layout: One level Home Equipment: Walker - 2 wheels      Prior Function Level of Independence: Needs assistance  Gait / Transfers Assistance Needed: Primarily household ambulatory with RW. ADL's / Homemaking Assistance Needed: Aid assists with ADL's.  Pt's husband is also very limited in mobility.     PT Goals (current goals can now be found in the care plan section) Acute Rehab PT Goals Patient Stated Goal: To get back to being able to walk on her own. Progress towards PT goals: PT to reassess next treatment    Frequency    7X/week      PT Plan Current plan remains appropriate    Co-evaluation PT/OT/SLP Co-Evaluation/Treatment: Yes Reason for Co-Treatment: Complexity of the patient's impairments (multi-system involvement);To address functional/ADL transfers PT goals addressed during session: Mobility/safety with  mobility;Balance;Proper use of DME;Strengthening/ROM OT goals addressed during session: ADL's and self-care;Proper use of Adaptive equipment and DME      AM-PAC PT "6 Clicks" Mobility   Outcome Measure  Help needed turning from your back to your side while in a flat bed without using bedrails?: A Lot Help needed moving from lying on your back to sitting on the side of a flat bed without using bedrails?: A Lot Help needed moving to and from a bed to a chair (including a wheelchair)?: A Lot Help needed standing up from a chair using your arms (e.g., wheelchair or bedside chair)?: A Lot Help needed to walk in hospital room?: A Lot Help needed climbing 3-5 steps with a railing? : Total 6 Click Score: 11    End of Session Equipment Utilized During Treatment: Oxygen Activity Tolerance: Patient limited by pain;Patient limited by lethargy Patient left: in chair;with nursing/sitter in room;with call bell/phone within reach Nurse Communication: Mobility status PT Visit Diagnosis: Unsteadiness on feet (R26.81);Muscle weakness (generalized) (M62.81);History of falling (Z91.81);Pain Pain - Right/Left: Right Pain - part of body: Hip     Time: 1420-1435 PT Time Calculation (min) (ACUTE ONLY): 15 min  Charges:  $Therapeutic Exercise: 8-22 mins $Therapeutic Activity: 8-22 mins                     Judson Roch  Blenda Mounts, PT, DPT    Ana Washington 10/04/2018, 3:29 PM

## 2018-10-04 NOTE — Progress Notes (Signed)
   Subjective: 4 Days Post-Op Procedure(s) (LRB): INTRAMEDULLARY (IM) NAIL INTERTROCHANTRIC (Right) Patient reports pain as mild, improving. Patient is well, and has had no acute complaints or problems Denies any CP,  ABD pain. SOB at baseline We will continue with physical therapy today.   Objective: Vital signs in last 24 hours: Temp:  [97.3 F (36.3 C)-98.1 F (36.7 C)] 97.8 F (36.6 C) (09/03 0251) Pulse Rate:  [72-99] 86 (09/03 0834) Resp:  [17-20] 17 (09/03 0251) BP: (135-166)/(81-101) 166/101 (09/03 0834) SpO2:  [98 %-100 %] 99 % (09/03 0907)  Intake/Output from previous day: 09/02 0701 - 09/03 0700 In: 399.5 [Blood:399.5] Out: -  Intake/Output this shift: No intake/output data recorded.  Recent Labs    10/02/18 0603 10/03/18 0411 10/04/18 0552  HGB 8.3* 7.4* 9.7*   Recent Labs    10/03/18 0411 10/04/18 0552  WBC 12.0* 11.1*  RBC 2.23* 2.94*  HCT 23.2* 29.2*  PLT 213 226   Recent Labs    10/02/18 0603 10/02/18 1258 10/03/18 0411  NA 137  --  136  K 5.4* 4.8 5.0  CL 96*  --  94*  CO2 35*  --  36*  BUN 17  --  20  CREATININE 0.50  --  0.51  GLUCOSE 137*  --  118*  CALCIUM 8.7*  --  8.6*   No results for input(s): LABPT, INR in the last 72 hours.  EXAM General - Patient is Alert, Appropriate and Oriented Extremity - Neurovascular intact Sensation intact distally Intact pulses distally Dorsiflexion/Plantar flexion intact No cellulitis present Compartment soft Dressing - dressing C/D/I and no drainage Motor Function - intact, moving foot and toes well on exam.   Past Medical History:  Diagnosis Date  . Arthritis   . Atrial fibrillation ([place])   . Bronchiectasis ([place])   . CAD (coronary artery disease)   . CAD (coronary artery disease) 07/30/2017  . Dumping syndrome   . Dyspnea   . Dysrhythmia    afib  . 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 Family history of adverse reaction to anesthesia    sister [name]  . GERD  (gastroesophageal reflux disease)   . Headache    MIGRAINES  . Hypertension   . Lung disease   . Myocardial infarction ([place]) 2007   Non-STEMI  . [name] (postoperative nausea and vomiting)   . Psoriasis   . PUD (peptic ulcer disease)     Assessment/Plan:   4 Days Post-Op Procedure(s) (LRB): INTRAMEDULLARY (IM) NAIL INTERTROCHANTRIC (Right) Active Problems:   Closed right hip fracture (HCC)   Acute post op blood loss anemia   Estimated body mass index is 17.47 kg/m as calculated from the following:   Height as of this encounter: 5\' 5"  (1.651 m).   Weight as of this encounter: 47.6 kg. Advance diet Up with therapy  Needs BM Acute post op blood loss anemia - Hgb 9.7 s/p 1 unit of PRBC on 10/03/2018. Recheck labs in the am. Pain well controlled CM to assist with discharge. Patient refusing SNF but unable to safely go home.   Remove staples and apply steri strips on 10/14/2018 Follow up with [facility] orthopedics in 6 weeks TED hose BLE x 6 weeks, remove at night time  DVT Prophylaxis - TED hose and SCDs, Eliquis Weight-Bearing as tolerated to right leg   T. Rachelle Hora, PA-C [place] 10/04/2018, 10:19 AM

## 2018-10-04 NOTE — TOC Progression Note (Signed)
Transition of Care [facility]) - Progression Note    Patient Details  Name: Ana Washington MRN: [id number] Date of Birth: [birth year]  Transition of Care [facility]) CM/SW Contact  Mima Cranmore, Lenice Llamas Phone Number: [phone]  10/04/2018, 5:05 PM  Clinical Narrative: Clinical Social Worker (CSW) attempted to contact patient via telephone to discuss D/C plan because. Patient is in a negative pressure room and has been exposed to covid so CSW contacted her via telephone however she did not answer. CSW made RN aware that patient needs qualifying sats for home oxygen. CSW will continue to follow and assist as needed.        Expected Discharge Plan: [place] Barriers to Discharge: Continued Medical Work up  Expected Discharge Plan and Services Expected Discharge Plan: [place] In-house Referral: Clinical Social Work Discharge Planning Services: CM Consult   Living arrangements for the past 2 months: Single Family Home Expected Discharge Date: 09/30/18                                     Social Determinants of Health (SDOH) Interventions    Readmission Risk Interventions Readmission Risk Prevention Plan 01/06/2018  Transportation Screening Complete  PCP or Specialist Appt within 5-7 Days Complete  Home Care Screening Complete  Medication Review (RN CM) Complete  Some recent data might be hidden

## 2018-10-04 NOTE — Progress Notes (Signed)
Physical Therapy Treatment Patient Details Name: Ana Washington MRN: [id number] DOB: 1945/11/27 Today's Date: 10/04/2018    History of Present Illness Pt is a 73 year old female admitted s/p I.M. nailing of R hip following a fall in her home.  Pt tripped over her O2 tubing.  PMH includes T10 kyphoplasty on 04/08/2018, atrial fibrillation, arthritis and anxiety.    PT Comments    This was a co-treatment with OT for safety and management of pt anxiety.  Nursing in room and pt awake when PT/OT arrived.  Pt min A for bed mobility for initiation of movement of R LE.  She was able to perform supine there ex to "warm up" before getting to bedside.  Pt able to STS with min A to initiate movement and for stability.  She took 3-4 steps with encouragement to WB on R side, very slow antalgic gait with decreased stance time on R LE.  Pt became symptomatic, reporting "weakness" which she stated continued to "get worse" as she walked.  Pt assisted to chair and HR: 117, BP: 170/95.  Nursing notified and pt assisted in positioning for comfort.  Pt will continue to benefit from skilled PT with focus on strength, pain management, tolerance to activity and safe functional mobility.   Follow Up Recommendations  SNF     Equipment Recommendations  Other (comment)(TBD at next venue of care.)    Recommendations for Other Services       Precautions / Restrictions Precautions Precautions: Fall Precaution Comments: High Fall risk Restrictions Weight Bearing Restrictions: Yes RLE Weight Bearing: Weight bearing as tolerated    Mobility  Bed Mobility Overal bed mobility: Needs Assistance Bed Mobility: Supine to Sit     Supine to sit: Min assist     General bed mobility comments: Pt able to get to bedside with min hand held assist and assistance with initiation of R LE movement.  Able to sit at bedside with supervision.  Transfers Overall transfer level: Needs assistance Equipment used: Rolling walker (2  wheeled) Transfers: Sit to/from Stand Sit to Stand: Min assist         General transfer comment: Pt able to rise from bedside with Min A to initiate elevating hips and for stability when standing.  Ambulation/Gait Ambulation/Gait assistance: Min assist Gait Distance (Feet): 4 Feet Assistive device: Rolling walker (2 wheeled)     Gait velocity interpretation: <1.8 ft/sec, indicate of risk for recurrent falls General Gait Details: Very low foot clearance, step length.  Encouragement needed to advance L LE and WB on R LE.  Pt able to manage RW without assistance.  Pt became tachycardic (HR 117) and reported weakness, immediately moved to chair.   Stairs             Wheelchair Mobility    Modified Rankin (Stroke Patients Only)       Balance Overall balance assessment: Needs assistance Sitting-balance support: Bilateral upper extremity supported;Feet supported Sitting balance-Leahy Scale: Good Sitting balance - Comments: Sits with flexed posture at bedside and requires close guard.   Standing balance support: Bilateral upper extremity supported Standing balance-Leahy Scale: Fair Standing balance comment: Able to stand with RW today but requires very close CGA for safety.                            Cognition Arousal/Alertness: Awake/alert Behavior During Therapy: Restless;WFL for tasks assessed/performed Overall Cognitive Status: Within Functional Limits for tasks assessed  General Comments: Follows commands consistently.  Requires encouragement throughout session.      Exercises General Exercises - Lower Extremity Ankle Circles/Pumps: 20 reps;Both;AROM;Supine Quad Sets: Both;10 reps;Supine;Strengthening Heel Slides: AROM;Both;10 reps;Supine Hip Flexion/Marching: Seated;Both;10 reps;Strengthening Other Exercises Other Exercises: Time to monitor vitals x4 min    General Comments        Pertinent  Vitals/Pain Pain Assessment: 0-10 Pain Score: 4  Pain Location: R hip Pain Descriptors / Indicators: Aching Pain Intervention(s): Limited activity within patient's tolerance    Home Living                      Prior Function            PT Goals (current goals can now be found in the care plan section)      Frequency    7X/week      PT Plan Current plan remains appropriate    Co-evaluation PT/OT/SLP Co-Evaluation/Treatment: Yes Reason for Co-Treatment: Complexity of the patient's impairments (multi-system involvement);To address functional/ADL transfers PT goals addressed during session: Mobility/safety with mobility;Balance;Proper use of DME;Strengthening/ROM OT goals addressed during session: ADL's and self-care;Proper use of Adaptive equipment and DME      AM-PAC PT "6 Clicks" Mobility   Outcome Measure  Help needed turning from your back to your side while in a flat bed without using bedrails?: A Lot Help needed moving from lying on your back to sitting on the side of a flat bed without using bedrails?: A Lot Help needed moving to and from a bed to a chair (including a wheelchair)?: A Lot Help needed standing up from a chair using your arms (e.g., wheelchair or bedside chair)?: A Lot Help needed to walk in hospital room?: A Lot Help needed climbing 3-5 steps with a railing? : Total 6 Click Score: 11    End of Session Equipment Utilized During Treatment: Gait belt;Oxygen Activity Tolerance: Patient limited by fatigue;Patient limited by pain Patient left: in chair;with nursing/sitter in room;with call bell/phone within reach Nurse Communication: Mobility status PT Visit Diagnosis: Unsteadiness on feet (R26.81);Muscle weakness (generalized) (M62.81);History of falling (Z91.81);Pain Pain - Right/Left: Right Pain - part of body: Hip     Time: 1125-1200 PT Time Calculation (min) (ACUTE ONLY): 35 min  Charges:  $Therapeutic Exercise: 8-22 mins                      Roxanne Gates, PT, DPT    Roxanne Gates 10/04/2018, 12:54 PM

## 2018-10-04 NOTE — Progress Notes (Signed)
[place] [facility] [place] NAME: Ana Washington    MR#:  [id number]  DATE OF BIRTH:  [birth year]  SUBJECTIVE:  Physical therapy did not work with the patient due to low hemoglobin yesterday.  She is upset.  She wants to go home.  She absolutely refuses again to go to skilled nursing facility. REVIEW OF SYSTEMS:    Review of Systems  Constitutional: Negative for fever, chills weight loss HENT: Negative for ear pain, nosebleeds, congestion, facial swelling, rhinorrhea, neck pain, neck stiffness and ear discharge.   Respiratory: Negative for cough, shortness of breath, wheezing  Cardiovascular: Negative for chest pain, palpitations and leg swelling.  Gastrointestinal: Negative for heartburn, abdominal pain, vomiting, diarrhea or consitpation Genitourinary: Negative for dysuria, urgency, frequency, hematuria Musculoskeletal: Negative for back pain denies hip pain Neurological: Negative for dizziness, seizures, syncope, focal weakness,  numbness and headaches.  Hematological: Does not bruise/bleed easily.  Psychiatric/Behavioral: Negative for hallucinations, confusion, dysphoric mood    Tolerating Diet: yes     DRUG ALLERGIES:   Allergies  Allergen Reactions  . Sulfa Antibiotics Diarrhea  . Codeine Nausea And Vomiting  . Penicillins Rash    Has patient had a PCN reaction causing immediate rash, facial/tongue/throat swelling, SOB or lightheadedness with hypotension: Unknown Has patient had a PCN reaction causing severe rash involving mucus membranes or skin necrosis: Unknown Has patient had a PCN reaction that required hospitalization: Unknown Has patient had a PCN reaction occurring within the last 10 years: No If all of the above answers are "NO", then may proceed with Cephalosporin use.     VITALS:  Blood pressure (!) 166/101, pulse 86, temperature 97.8 F (36.6 C), resp. rate 17, height _0  (1.651 m), weight 47.6 kg, SpO2 99  %.  PHYSICAL EXAMINATION:  Constitutional: Appears well-developed and well-nourished. No distress. HENT: Normocephalic. [name] Oropharynx is clear and moist.  Eyes: Conjunctivae and EOM are normal. PERRLA, no scleral icterus.  Neck: Normal ROM. Neck supple. No JVD. No tracheal deviation. CVS: RRR, S1/S2 +, no murmurs, no gallops, no carotid bruit.  Pulmonary: b/l rhonchii Abdominal: Soft. BS +,  no distension, tenderness, rebound or guarding.  Musculoskeletal: can move her legs Neuro: Alert. CN 2-12 grossly intact. No focal deficits. Skin: Skin is warm and dry. No rash noted. Psychiatric: Normal mood and affect.      LABORATORY PANEL:   CBC Recent Labs  Lab 10/04/18 0552  WBC 11.1*  HGB 9.7*  HCT 29.2*  PLT 226   ------------------------------------------------------------------------------------------------------------------  Chemistries  Recent Labs  Lab 09/27/18 1309  10/02/18 0603  10/03/18 0411  NA 141   < > 137  --  136  K 3.0*   < > 5.4*   < > 5.0  CL 93*   < > 96*  --  94*  CO2 35*   < > 35*  --  36*  GLUCOSE 118*   < > 137*  --  118*  BUN 14   < > 17  --  20  CREATININE 0.58   < > 0.50  --  0.51  CALCIUM 9.3   < > 8.7*  --  8.6*  MG  --    < > 2.2  --   --   AST 30  --   --   --   --   ALT 20  --   --   --   --   ALKPHOS 49  --   --   --   --  BILITOT 0.7  --   --   --   --    < > = values in this interval not displayed.   ------------------------------------------------------------------------------------------------------------------  Cardiac Enzymes No results for input(s): TROPONINI in the last 168 hours. ------------------------------------------------------------------------------------------------------------------  RADIOLOGY:  No results found.   ASSESSMENT AND PLAN:    73 year old female with chronic hypoxic respiratory failure on 2 L of oxygen and CAD who presented to the emergency room due to mechanical fall and right sided hip  pain.  1.  Nondisplaced right intertrochanteric fracture:  4 Day Post-Op Procedure(s) (LRB): INTRAMEDULLARY (IM) NAIL INTERTROCHANTRIC  DVT prophylaxis with Eliquis, TED hose and SCDs Weightbearing as tolerated to right leg She is s/p 1 unit PRBC due to acute postop blood loss with hemoglobin over 9  this am.    2.  Acute on chronic hypoxic respiratory failure with history of bronchiectasis/MAC and left lower lobe resection and acute exacerbation of severe bilateral bronchiectasis: Pulmonary consult appreciated Wean oral steroids as tolerated.    Continue azithromycin and ciprofloxacin for prophylaxis due to history of chronic Mycobacterium AVM infection.  No evidence of pneumonia or COPD exacerbation as per pulmonary.   3.  CAD: Continue metoprolol  4.  Essential hypertension: Continue diltiazem and metoprolol Holding Norvasc and losartan due to normal blood pressure.    5.  PAF: Patient currently in normal sinus rhythm Continue Eliquis. Continue diltiazem and metoprolol for heart rate control.  6.  ESBL UTI: She has completed course of meropenem for 5 days.   management plans discussed with the patient and she is in agreement.  CODE STATUS: dnr  TOTAL TIME TAKING CARE OF THIS PATIENT: 25 minutes.   Patient would like to go home with home health.  Physical therapy is recommending skilled nursing facility.  She is too weak to go home at this time.  We will continue with physical therapy while in hospital.  POSSIBLE D/C tomorrow DEPENDING ON CLINICAL CONDITION.   Bettey Costa M.D on 10/04/2018 at 10:21 AM  Between 7am to 6pm - Pager - [phone] After 6pm go to [email or web address] - password [email or web address] [place]  Office  [phone]  CC: Primary care physician; [name], MD  Note: This dictation was prepared with Dragon dictation along with smaller phrase technology. Any transcriptional errors that result from this process are unintentional.

## 2018-10-04 NOTE — Progress Notes (Signed)
SUBJECTIVE: Patient is feeling much better   Vitals:   10/03/18 2017 10/04/18 0251 10/04/18 0834 10/04/18 0907  BP:  (!) 162/91 (!) 166/101   Pulse:  72 86   Resp:  17    Temp:  97.8 F (36.6 C)    TempSrc:      SpO2: 98% 100% 99% 99%  Weight:      Height:        Intake/Output Summary (Last 24 hours) at 10/04/2018 1146 Last data filed at 10/03/2018 1851 Gross per 24 hour  Intake 399.5 ml  Output -  Net 399.5 ml    LABS: Basic Metabolic Panel: Recent Labs    10/02/18 0603 10/02/18 1258 10/03/18 0411  NA 137  --  136  K 5.4* 4.8 5.0  CL 96*  --  94*  CO2 35*  --  36*  GLUCOSE 137*  --  118*  BUN 17  --  20  CREATININE 0.50  --  0.51  CALCIUM 8.7*  --  8.6*  MG 2.2  --   --    Liver Function Tests: No results for input(s): AST, ALT, ALKPHOS, BILITOT, PROT, ALBUMIN in the last 72 hours. No results for input(s): LIPASE, AMYLASE in the last 72 hours. CBC: Recent Labs    10/03/18 0411 10/04/18 0552  WBC 12.0* 11.1*  HGB 7.4* 9.7*  HCT 23.2* 29.2*  MCV 104.0* 99.3  PLT 213 226   Cardiac Enzymes: No results for input(s): CKTOTAL, CKMB, CKMBINDEX, TROPONINI in the last 72 hours. BNP: Invalid input(s): POCBNP D-Dimer: No results for input(s): DDIMER in the last 72 hours. Hemoglobin A1C: No results for input(s): HGBA1C in the last 72 hours. Fasting Lipid Panel: No results for input(s): CHOL, HDL, LDLCALC, TRIG, CHOLHDL, LDLDIRECT in the last 72 hours. Thyroid Function Tests: No results for input(s): TSH, T4TOTAL, T3FREE, THYROIDAB in the last 72 hours.  Invalid input(s): FREET3 Anemia Panel: No results for input(s): VITAMINB12, FOLATE, FERRITIN, TIBC, IRON, RETICCTPCT in the last 72 hours.   PHYSICAL EXAM General: Well developed, well nourished, in no acute distress HEENT:  Normocephalic and atramatic Neck:  No JVD.  Lungs: Clear bilaterally to auscultation and percussion. Heart: HRRR . Normal S1 and S2 without gallops or murmurs.  Abdomen: Bowel sounds  are positive, abdomen soft and non-tender  Msk:  Back normal, normal gait. Normal strength and tone for age. Extremities: No clubbing, cyanosis or edema.   Neuro: Alert and oriented X 3. Psych:  Good affect, responds appropriately  TELEMETRY: Sinus rhythm  ASSESSMENT AND PLAN: Status post hip fracture and surgery without any symptoms now.  Will sign off.  Active Problems:   Closed right hip fracture (HCC)    Neoma Laming A, MD, [facility] [place] 10/04/2018 11:46 AM

## 2018-10-05 ENCOUNTER — Inpatient Hospital Stay: Payer: Medicare Other

## 2018-10-05 LAB — BASIC METABOLIC PANEL
Anion gap: 11 (ref 5–15)
BUN: 29 mg/dL — ABNORMAL HIGH (ref 8–23)
CO2: 37 mmol/L — ABNORMAL HIGH (ref 22–32)
Calcium: 9.3 mg/dL (ref 8.9–10.3)
Chloride: 89 mmol/L — ABNORMAL LOW (ref 98–111)
Creatinine, Ser: 0.55 mg/dL (ref 0.44–1.00)
GFR calc Af Amer: >60 mL/min (ref >60)
GFR calc non Af Amer: >60 mL/min (ref >60)
Glucose, Bld: 83 mg/dL (ref 70–99)
Potassium: 4.8 mmol/L (ref 3.5–5.1)
Sodium: 137 mmol/L (ref 135–145)

## 2018-10-05 LAB — CBC
HCT: 28.8 % — ABNORMAL LOW (ref 36.0–46.0)
Hemoglobin: 9.2 g/dL — ABNORMAL LOW (ref 12.0–15.0)
MCH: 32.3 pg (ref 26.0–34.0)
MCHC: 31.9 g/dL (ref 30.0–36.0)
MCV: 101.1 fL — ABNORMAL HIGH (ref 80.0–100.0)
Platelets: 392 10*3/uL (ref 150–400)
RBC: 2.85 MIL/uL — ABNORMAL LOW (ref 3.87–5.11)
RDW: 13.9 % (ref 11.5–15.5)
WBC: 23.7 10*3/uL — ABNORMAL HIGH (ref 4.0–10.5)
nRBC: 0 % (ref 0.0–0.2)

## 2018-10-05 LAB — SARS CORONAVIRUS 2 BY RT PCR (HOSPITAL ORDER, PERFORMED IN ~~LOC~~ HOSPITAL LAB): SARS Coronavirus 2: NEGATIVE

## 2018-10-05 LAB — ABO/RH: ABO/RH(D): A POS

## 2018-10-05 MED ORDER — SODIUM CHLORIDE 3 % IN NEBU
3.0000 mL | INHALATION_SOLUTION | Freq: Every day | RESPIRATORY_TRACT | Status: AC
  Administered 2018-10-06 – 2018-10-07 (×2): 3 mL via RESPIRATORY_TRACT
  Filled 2018-10-05 (×3): qty 4

## 2018-10-05 MED ORDER — IPRATROPIUM-ALBUTEROL 20-100 MCG/ACT IN AERS
2.0000 | INHALATION_SPRAY | Freq: Three times a day (TID) | RESPIRATORY_TRACT | Status: DC
  Administered 2018-10-05 – 2018-10-07 (×7): 2 via RESPIRATORY_TRACT
  Filled 2018-10-05: qty 4

## 2018-10-05 NOTE — TOC Progression Note (Addendum)
Transition of Care [facility]) - Progression Note    Patient Details  Name: Ana Washington MRN: [id number] Date of Birth: Feb 29, 1945  Transition of Care [facility]) CM/SW Standard, RN Phone Number: 10/05/2018, 12:21 PM  Clinical Narrative:     Spoke with the patient via Phone she has decided to accept the bed offer at [facility] in [place] and not [facility] was offered, I explained that [facility] has a higher star rating and she declined , I called Doug at the [facility] and requested that they start insurance auth, he agreed to start the insurance auth today and will let me know once obtained  Expected Discharge Plan: [place] Barriers to Discharge: Continued Medical Work up  Expected Discharge Plan and Services Expected Discharge Plan: [place] In-house Referral: Clinical Social Work Discharge Planning Services: CM Consult   Living arrangements for the past 2 months: Single Family Home Expected Discharge Date: 09/30/18                                     Social Determinants of Health (SDOH) Interventions    Readmission Risk Interventions Readmission Risk Prevention Plan 01/06/2018  Transportation Screening Complete  PCP or Specialist Appt within 5-7 Days Complete  Home Care Screening Complete  Medication Review (RN CM) Complete  Some recent data might be hidden

## 2018-10-05 NOTE — Progress Notes (Signed)
Physical Therapy Treatment Patient Details Name: Ana Washington MRN: [id number] DOB: [birth year] Today's Date: 10/05/2018    History of Present Illness Pt is a 73 year old female admitted s/p I.M. nailing of R hip following a fall in her home.  Pt tripped over her O2 tubing.  PMH includes T10 kyphoplasty on 04/08/2018, atrial fibrillation, arthritis and anxiety.    PT Comments    Patient agreeable to participation with session, goal of OOB to chair for lunch; however, requires frequent redirection to task at hand/goals of session due to multiple needs/requests throughout session.  Extensive encouragement and increased time to complete all activities, though able to complete all with mod assist +1 this date.  Poor tolerance for WBing/stance time R LE; unable to fully advance LEs via stepping pattern (tends to slide/scoot across floor).  Generally self-limiting for all mobility/participation tasks.   Follow Up Recommendations  SNF     Equipment Recommendations       Recommendations for Other Services       Precautions / Restrictions Precautions Precautions: Fall Precaution Comments: airborne/contact iso Restrictions Weight Bearing Restrictions: Yes RLE Weight Bearing: Weight bearing as tolerated    Mobility  Bed Mobility Overal bed mobility: Needs Assistance Bed Mobility: Supine to Sit     Supine to sit: Mod assist     General bed mobility comments: mod assist for R LE management, truncal elevation and scooting towards edge of bed  Transfers Overall transfer level: Needs assistance Equipment used: Rolling walker (2 wheeled)   Sit to Stand: Mod assist Stand pivot transfers: Mod assist       General transfer comment: assist fo rlift off, standing balance; tends to slide feet across floor vs truly step.  Poor tolerance for WBing R LE, maintains anterior to BOS at all times.  Ambulation/Gait             General Gait Details: unable to tolerate this  session   Stairs             Wheelchair Mobility    Modified Rankin (Stroke Patients Only)       Balance Overall balance assessment: Needs assistance Sitting-balance support: No upper extremity supported;Feet supported Sitting balance-Leahy Scale: Good     Standing balance support: Bilateral upper extremity supported Standing balance-Leahy Scale: Poor Standing balance comment: requires UE support, +1 at all times                            Cognition Arousal/Alertness: Awake/alert Behavior During Therapy: WFL for tasks assessed/performed Overall Cognitive Status: Within Functional Limits for tasks assessed                                 General Comments: follow commands, but requires extensive reorientation to task at hand/goals of session; extremely focused on multiple needs, requests (unrelated to therapy) throughout session      Exercises Other Exercises Other Exercises: Rolling/repositioning in bed to address/manage bladder incontinence, min/mod assist Other Exercises: Therex deferred at this time, as patient excessively focused on additional needs/requests during session.    General Comments        Pertinent Vitals/Pain Pain Assessment: Faces Faces Pain Scale: Hurts even more Pain Location: R knee Pain Descriptors / Indicators: Aching;Guarding;Grimacing Pain Intervention(s): Limited activity within patient's tolerance;Monitored during session;Premedicated before session;Repositioned;Patient requesting pain meds-RN notified    Home Living  Prior Function            PT Goals (current goals can now be found in the care plan section) Acute Rehab PT Goals Patient Stated Goal: To get back to being able to walk on her own. PT Goal Formulation: With patient Time For Goal Achievement: 10/15/18 Potential to Achieve Goals: Fair Progress towards PT goals: Progressing toward goals    Frequency     7X/week      PT Plan Current plan remains appropriate    Co-evaluation              AM-PAC PT "6 Clicks" Mobility   Outcome Measure  Help needed turning from your back to your side while in a flat bed without using bedrails?: A Lot Help needed moving from lying on your back to sitting on the side of a flat bed without using bedrails?: A Lot Help needed moving to and from a bed to a chair (including a wheelchair)?: A Lot Help needed standing up from a chair using your arms (e.g., wheelchair or bedside chair)?: A Lot Help needed to walk in hospital room?: Total Help needed climbing 3-5 steps with a railing? : Total 6 Click Score: 10    End of Session Equipment Utilized During Treatment: Gait belt;Oxygen Activity Tolerance: Patient tolerated treatment well;Patient limited by pain Patient left: in chair;with call bell/phone within reach;with chair alarm set Nurse Communication: Mobility status PT Visit Diagnosis: Unsteadiness on feet (R26.81);Muscle weakness (generalized) (M62.81);History of falling (Z91.81);Pain Pain - Right/Left: Right Pain - part of body: Knee     Time: [id number] PT Time Calculation (min) (ACUTE ONLY): 44 min  Charges:  $Therapeutic Activity: 38-52 mins                     Fable Huisman H. Owens Shark, PT, DPT, NCS 10/05/18, 1:49 PM [phone]

## 2018-10-05 NOTE — Progress Notes (Signed)
Subjective: 5 Days Post-Op Procedure(s) (LRB): INTRAMEDULLARY (IM) NAIL INTERTROCHANTRIC (Right) Patient reports pain as mild.  Severe spasm and right quadricep muscle yesterday, this has improved much today.  Patient is well, and has had no acute complaints or problems Denies any CP,  ABD pain. SOB at baseline We will continue with physical therapy today.   Objective: Vital signs in last 24 hours: Temp:  [97.8 F (36.6 C)-98.1 F (36.7 C)] 97.8 F (36.6 C) (09/03 2104) Pulse Rate:  [74-86] 74 (09/03 2104) Resp:  [16-19] 19 (09/03 2104) BP: (107-166)/(81-101) 107/81 (09/03 2104) SpO2:  [98 %-100 %] 99 % (09/03 2104)  Intake/Output from previous day: 09/03 0701 - 09/04 0700 In: 400 [IV Piggyback:400] Out: 1100 [Urine:1100] Intake/Output this shift: No intake/output data recorded.  Recent Labs    10/03/18 0411 10/04/18 0552  HGB 7.4* 9.7*   Recent Labs    10/03/18 0411 10/04/18 0552  WBC 12.0* 11.1*  RBC 2.23* 2.94*  HCT 23.2* 29.2*  PLT 213 226   Recent Labs    10/02/18 1258 10/03/18 0411  NA  --  136  K 4.8 5.0  CL  --  94*  CO2  --  36*  BUN  --  20  CREATININE  --  0.51  GLUCOSE  --  118*  CALCIUM  --  8.6*   No results for input(s): LABPT, INR in the last 72 hours.  EXAM General - Patient is Alert, Appropriate and Oriented Extremity - Neurovascular intact Sensation intact distally Intact pulses distally Dorsiflexion/Plantar flexion intact No cellulitis present Compartment soft  Quadricep muscle soft, no defect in the quad tendon.  Patient is able to straight leg raise Dressing - dressing C/D/I and no drainage Motor Function - intact, moving foot and toes well on exam.   Past Medical History:  Diagnosis Date  . Arthritis   . Atrial fibrillation ([place])   . Bronchiectasis ([place])   . CAD (coronary artery disease)   . CAD (coronary artery disease) 07/30/2017  . Dumping syndrome   . Dyspnea   . Dysrhythmia    afib  . Essential hypertension,  malignant 10/03/2013  . Family history of adverse reaction to anesthesia    sister [name]  . GERD (gastroesophageal reflux disease)   . Headache    MIGRAINES  . Hypertension   . Lung disease   . Myocardial infarction ([place]) 2007   Non-STEMI  . [name] (postoperative nausea and vomiting)   . Psoriasis   . PUD (peptic ulcer disease)     Assessment/Plan:   5 Days Post-Op Procedure(s) (LRB): INTRAMEDULLARY (IM) NAIL INTERTROCHANTRIC (Right) Active Problems:   Closed right hip fracture (HCC)   Acute post op blood loss anemia   Estimated body mass index is 17.47 kg/m as calculated from the following:   Height as of this encounter: 5\' 5"  (1.651 m).   Weight as of this encounter: 47.6 kg. Advance diet Up with therapy  Needs BM Acute post op blood loss anemia - Hgb 9.7 s/p 1 unit of PRBC on 10/03/2018. Labs pending this morning Pain well controlled CM to assist with discharge. Patient refusing SNF but unable to safely go home.   Remove staples and apply steri strips on 10/14/2018 Follow up with [facility] orthopedics in 6 weeks TED hose BLE x 6 weeks, remove at night time  DVT Prophylaxis - TED hose and SCDs, Eliquis Weight-Bearing as tolerated to right leg   T. Rachelle Hora, PA-C [place] 10/05/2018, 7:30 AM

## 2018-10-05 NOTE — Progress Notes (Signed)
Pulmonary Medicine          Date: 10/05/2018,   MRN# [id number] Ana Washington November 05, 1945     AdmissionWeight: 47.6 kg                 CurrentWeight: 47.6 kg      CHIEF COMPLAINT:   Acute on chronic respiratory failure   SUBJECTIVE    Had + exposure to covid contact. Moved to neg pressure room.    She is now down to 3Lmin O2 theray which is home setting.   Patient states her breathing is better, complains of right knee pain.   PAST MEDICAL HISTORY   Past Medical History:  Diagnosis Date   Arthritis    Atrial fibrillation (HCC)    Bronchiectasis (HCC)    CAD (coronary artery disease)    CAD (coronary artery disease) 07/30/2017   Dumping syndrome    Dyspnea    Dysrhythmia    afib   Essential hypertension, malignant 10/03/2013   Family history of adverse reaction to anesthesia    sister [name]   GERD (gastroesophageal reflux disease)    Headache    MIGRAINES   Hypertension    Lung disease    Myocardial infarction ([place]) 2007   Non-STEMI   [name] (postoperative nausea and vomiting)    Psoriasis    PUD (peptic ulcer disease)      SURGICAL HISTORY   Past Surgical History:  Procedure Laterality Date   BACK SURGERY     CHOLECYSTECTOMY     EYE SURGERY     FOOT SURGERY     INTRAMEDULLARY (IM) NAIL INTERTROCHANTERIC Right 09/30/2018   Procedure: INTRAMEDULLARY (IM) NAIL INTERTROCHANTRIC;  Surgeon: Dereck Leep, MD;  Location: [facility];  Service: Orthopedics;  Laterality: Right;   KYPHOPLASTY N/A 07/05/2016   Procedure: KYPHOPLASTY T - 9;  Surgeon: Hessie Knows, MD;  Location: [facility];  Service: Orthopedics;  Laterality: N/A;   KYPHOPLASTY N/A 11/29/2017   Procedure: Iona Hansen;  Surgeon: Hessie Knows, MD;  Location: [facility];  Service: Orthopedics;  Laterality: N/A;  L2 and L3   KYPHOPLASTY N/A 12/18/2017   Procedure: KYPHOPLASTY L1;  Surgeon: Hessie Knows, MD;  Location: [facility];  Service: Orthopedics;  Laterality:  N/A;   KYPHOPLASTY N/A 01/05/2018   Procedure: KYPHOPLASTY-T11,T12;  Surgeon: Hessie Knows, MD;  Location: [facility];  Service: Orthopedics;  Laterality: N/A;   KYPHOPLASTY N/A 04/05/2018   Procedure: T10 KYPHOPLASTY;  Surgeon: Hessie Knows, MD;  Location: [facility];  Service: Orthopedics;  Laterality: N/A;   KYPHOPLASTY N/A 04/12/2018   Procedure: KYPHOPLASTY T7,8;  Surgeon: Hessie Knows, MD;  Location: [facility];  Service: Orthopedics;  Laterality: N/A;   KYPHOPLASTY N/A 04/19/2018   Procedure: KYPHOPLASTY T5, T6;  Surgeon: Hessie Knows, MD;  Location: [facility];  Service: Orthopedics;  Laterality: N/A;   LUNG SURGERY  1990 and 1996   THOROCOTOMY WITH LOBECTOMY     LEFT LOWER THORACOTOMY / RIGHT MIDDLE LOBECTOMY     FAMILY HISTORY   Family History  Problem Relation Age of Onset   Hypertension Mother    Hypertension Father      SOCIAL HISTORY   Social History   Tobacco Use   Smoking status: Never Smoker   Smokeless tobacco: Never Used  Substance Use Topics   Alcohol use: No   Drug use: No     MEDICATIONS    Home Medication:    Current Medication:  Current Facility-Administered Medications:  0.9 %  sodium chloride infusion (Manually program via Guardrails IV Fluids), , Intravenous, Once, Mody, Sital, MD   acetaminophen (TYLENOL) tablet 325-650 mg, 325-650 mg, Oral, Q6H PRN, Hooten, Laurice Record, MD, 650 mg at 10/01/18 0818   acidophilus (RISAQUAD) capsule 1 capsule, 1 capsule, Oral, Daily, Hooten, Laurice Record, MD, 1 capsule at 10/04/18 1127   amLODipine (NORVASC) tablet 5 mg, 5 mg, Oral, Daily, Mody, Sital, MD, 5 mg at 10/04/18 1426   apixaban (ELIQUIS) tablet 5 mg, 5 mg, Oral, BID, Hooten, Laurice Record, MD, 5 mg at 10/04/18 2132   azithromycin (ZITHROMAX) tablet 250 mg, 250 mg, Oral, Daily, Hooten, Laurice Record, MD, 250 mg at 10/04/18 1126   bisacodyl (DULCOLAX) suppository 10 mg, 10 mg, Rectal, Daily PRN, Hooten, Laurice Record, MD   calcium-vitamin D (OSCAL WITH D)  500-200 MG-UNIT per tablet 1 tablet, 1 tablet, Oral, Daily, Hooten, Laurice Record, MD, 1 tablet at 10/04/18 1427   chlorpheniramine-HYDROcodone (TUSSIONEX) 10-8 MG/5ML suspension 5 mL, 5 mL, Oral, QHS, Hooten, Laurice Record, MD, 5 mL at 10/04/18 2133   ciprofloxacin (CIPRO) tablet 500 mg, 500 mg, Oral, BID, Hooten, Laurice Record, MD, 500 mg at 10/04/18 2133   diltiazem (CARDIZEM CD) 24 hr capsule 120 mg, 120 mg, Oral, Daily, Hooten, Laurice Record, MD, 120 mg at 10/04/18 1128   feeding supplement (ENSURE ENLIVE) (ENSURE ENLIVE) liquid 237 mL, 237 mL, Oral, BID BM, Hooten, Laurice Record, MD, 237 mL at 10/04/18 1128   ferrous sulfate tablet 325 mg, 325 mg, Oral, Daily, Hooten, Laurice Record, MD, 325 mg at 10/03/18 1731   gabapentin (NEURONTIN) capsule 300 mg, 300 mg, Oral, QID, Hooten, Laurice Record, MD, 300 mg at 10/04/18 2133   guaiFENesin-dextromethorphan (ROBITUSSIN DM) 100-10 MG/5ML syrup 5 mL, 5 mL, Oral, Q4H PRN, Hooten, Laurice Record, MD   HYDROmorphone (DILAUDID) injection 0.5-1 mg, 0.5-1 mg, Intravenous, Q4H PRN, Hooten, Laurice Record, MD, 0.5 mg at 10/04/18 1318   Ipratropium-Albuterol (COMBIVENT) respimat 2 puff, 2 puff, Inhalation, TID, Ottie Glazier, MD   LORazepam (ATIVAN) tablet 0.5 mg, 0.5 mg, Oral, Q4H, Hooten, Laurice Record, MD, 0.5 mg at 10/05/18 0631   magnesium hydroxide (MILK OF MAGNESIA) suspension 30 mL, 30 mL, Oral, Daily PRN, Hooten, Laurice Record, MD, 30 mL at 10/02/18 0951   menthol-cetylpyridinium (CEPACOL) lozenge 3 mg, 1 lozenge, Oral, PRN **OR** phenol (CHLORASEPTIC) mouth spray 1 spray, 1 spray, Mouth/Throat, PRN, Hooten, Laurice Record, MD   metoCLOPramide (REGLAN) tablet 5-10 mg, 5-10 mg, Oral, Q8H PRN **OR** metoCLOPramide (REGLAN) injection 5-10 mg, 5-10 mg, Intravenous, Q8H PRN, Hooten, Laurice Record, MD   metoprolol succinate (TOPROL-XL) 24 hr tablet 50 mg, 50 mg, Oral, QHS, Hooten, Laurice Record, MD, 50 mg at 10/04/18 2133   multivitamin with minerals tablet 1 tablet, 1 tablet, Oral, Daily, Hooten, Laurice Record, MD, 1 tablet at  10/03/18 1731   ondansetron (ZOFRAN) tablet 4 mg, 4 mg, Oral, Q6H PRN **OR** ondansetron (ZOFRAN) injection 4 mg, 4 mg, Intravenous, Q6H PRN, Hooten, Laurice Record, MD   oxyCODONE (Oxy IR/ROXICODONE) immediate release tablet 10 mg, 10 mg, Oral, Q4H PRN, Hooten, Laurice Record, MD   oxyCODONE (Oxy IR/ROXICODONE) immediate release tablet 5 mg, 5 mg, Oral, Q4H PRN, Hooten, Laurice Record, MD   pantoprazole (PROTONIX) EC tablet 40 mg, 40 mg, Oral, Daily, Hooten, Laurice Record, MD, 40 mg at 10/04/18 1126   polyethylene glycol (MIRALAX / GLYCOLAX) packet 17 g, 17 g, Oral, Daily, Mody, Sital, MD, 17 g at 10/04/18 1127   senna-docusate (Senokot-S) tablet  1 tablet, 1 tablet, Oral, BID, Hooten, Laurice Record, MD, 1 tablet at 10/04/18 2133   sodium chloride HYPERTONIC 3 % nebulizer solution 3 mL, 3 mL, Nebulization, Daily, Lanney Gins, Dulcemaria Bula, MD   sodium phosphate (FLEET) 7-19 GM/118ML enema 1 enema, 1 enema, Rectal, Once PRN, Hooten, Laurice Record, MD   sucralfate (CARAFATE) tablet 1 g, 1 g, Oral, QID, Hooten, Laurice Record, MD, 1 g at 10/04/18 1125   traMADol (ULTRAM) tablet 50-100 mg, 50-100 mg, Oral, Q4H PRN, Hooten, Laurice Record, MD, 50 mg at 10/04/18 2201   vitamin C (ASCORBIC ACID) tablet 1,000 mg, 1,000 mg, Oral, Daily, Hooten, Laurice Record, MD, 1,000 mg at 10/04/18 1126    ALLERGIES   Sulfa antibiotics, Codeine, and Penicillins     REVIEW OF SYSTEMS    Review of Systems:  Gen:  Denies  fever, sweats, chills weigh loss  HEENT: Denies blurred vision, double vision, ear pain, eye pain, hearing loss, nose bleeds, sore throat Cardiac:  No dizziness, chest pain or heaviness, chest tightness,edema Resp:   Denies cough or sputum porduction, shortness of breath,wheezing, hemoptysis,  Gi: Denies swallowing difficulty, stomach pain, nausea or vomiting, diarrhea, constipation, bowel incontinence Gu:  Denies bladder incontinence, burning urine Ext:   Denies Joint pain, stiffness or swelling Skin: Denies  skin rash, easy bruising or bleeding  or hives Endoc:  Denies polyuria, polydipsia , polyphagia or weight change Psych:   Denies depression, insomnia or hallucinations   Other:  All other systems negative   VS: BP (!) 153/90 (BP Location: Right Arm)    Pulse 97    Temp 97.9 F (36.6 C)    Resp 18    Ht _0  (1.651 m)    Wt 47.6 kg    SpO2 100%    BMI 17.47 kg/m      PHYSICAL EXAM    GENERAL:NAD, no fevers, chills, no weakness no fatigue HEAD: Normocephalic, atraumatic.  EYES: Pupils equal, round, reactive to light. Extraocular muscles intact. No scleral icterus.  MOUTH: Moist mucosal membrane. Dentition intact. No abscess noted.  EAR, NOSE, THROAT: Clear without exudates. No external lesions.  NECK: Supple. No thyromegaly. No nodules. No JVD.  PULMONARY:improved rhonchi  CARDIOVASCULAR: S1 and S2. Regular rate and rhythm. No murmurs, rubs, or gallops. No edema. Pedal pulses 2+ bilaterally.  GASTROINTESTINAL: Soft, nontender, nondistended. No masses. Positive bowel sounds. No hepatosplenomegaly.  MUSCULOSKELETAL: No swelling, clubbing, or edema. Range of motion full in all extremities.  NEUROLOGIC: Cranial nerves II through XII are intact. No gross focal neurological deficits. Sensation intact. Reflexes intact.  SKIN: No ulceration, lesions, rashes, or cyanosis. Skin warm and dry. Turgor intact.  PSYCHIATRIC: Mood, affect within normal limits. The patient is awake, alert and oriented x 3. Insight, judgment intact.       IMAGING    Dg Chest 1 View  Result Date: 09/30/2018 CLINICAL DATA:  Hip fracture.  Preoperative exam. EXAM: CHEST  1 VIEW COMPARISON:  09/27/2018.  CT 09/28/2018. FINDINGS: Heart size is stable. Previous pulmonary resection in the right midlung. Widespread bilateral pulmonary opacity persists, shown by CT to be secondary to widespread bronchiectasis and alveolar opacities. No radiographic change. Multiple old vertebral augmentations. IMPRESSION: No significant radiographic change. Extensive  widespread bilateral pulmonary opacities as above and discussed completely by chest CT yesterday. Electronically Signed   By: Nelson Chimes M.D.   On: 09/30/2018 08:02   Ct Chest W Contrast  Result Date: 09/28/2018 CLINICAL DATA:  Inpatient. Dyspnea. Abnormal chest radiograph with question  new suprahilar opacity on the right. Clinical concern for interstitial lung disease. EXAM: CT CHEST WITH CONTRAST TECHNIQUE: Multidetector CT imaging of the chest was performed during intravenous contrast administration. CONTRAST:  49m OMNIPAQUE IOHEXOL 300 MG/ML  SOLN COMPARISON:  Chest radiograph from one day prior. 04/08/2018 chest CT angiogram. FINDINGS: Cardiovascular: Top-normal heart size. No significant pericardial effusion/thickening. Left anterior descending coronary atherosclerosis. Atherosclerotic nonaneurysmal thoracic aorta. Normal caliber main pulmonary artery. No central pulmonary emboli. Mediastinum/Nodes: No discrete thyroid nodules. Unremarkable esophagus. No axillary adenopathy. Mildly enlarged 1.3 cm low right paratracheal lymph node (series 2/image 56) and mildly enlarged 1.4 cm subcarinal node (series 2/image 67), both stable since 04/08/2018. No new pathologically enlarged mediastinal nodes. No hilar adenopathy. Lungs/Pleura: Status post left lower and right middle lobectomies. No pneumothorax. Trace dependent left pleural effusion. No right pleural effusion. There is diffuse moderate cylindrical and varicoid bronchiectasis throughout both lungs with associated diffuse bronchial wall thickening. No acute consolidative airspace disease or lung masses. Mild patchy tree-in-bud opacity and scattered mucoid impaction associated with the areas of bronchiectasis, substantially decreased in the interval. Extensive parenchymal bands throughout the periphery of both lungs compatible with postinfectious/postinflammatory scarring, unchanged. No significant pulmonary nodules. Mild interlobular septal thickening and  mosaic attenuation throughout both lungs, similar to prior. Upper abdomen: Cholecystectomy. Stable moderate intrahepatic biliary ductal dilatation. Simple 3.7 cm lateral left renal cyst. Musculoskeletal: No aggressive appearing focal osseous lesions. Multilevel thoracolumbar vertebral compression fractures from T5 through L3 with associated vertebroplasty material at each level. IMPRESSION: 1. Diffuse cylindrical and varicoid bronchiectasis in both lungs with associated bandlike scarring throughout the periphery of both lungs. Mild patchy tree-in-bud opacities and scattered mucoid impaction is significantly improved since 04/08/2018 chest CT. 2. Prior left lower and right middle lobectomy. 3. Chronic mosaic attenuation in both lungs is probably due to air trapping. 4. Mild mediastinal lymphadenopathy is stable and probably reactive. 5. One vessel coronary atherosclerosis. Aortic Atherosclerosis (ICD10-I70.0). Electronically Signed   By: JIlona SorrelM.D.   On: 09/28/2018 16:53   Mr Pelvis Wo Contrast  Result Date: 09/27/2018 CLINICAL DATA:  Right hip pain after a fall. Abnormal radiographs suggesting right sacral fracture. EXAM: MRI PELVIS WITHOUT CONTRAST TECHNIQUE: Multiplanar multisequence MR imaging of the pelvis was performed. No intravenous contrast was administered. COMPARISON:  Radiographs dated 09/27/2018 FINDINGS: Musculoskeletal: There is an acute nondisplaced intertrochanteric fracture of the proximal right femur. There is an adjacent tear an intramuscular hematoma in the right gluteus maximus muscle. Is hemorrhage into the right greater trochanteric bursa and into the vastus lateralis muscle. There is also a small strain or tear of the proximal right gluteus maximus muscle. There is a subtle fracture of the third sacral segment extending into the sacral ala I to the right and left. There are subtle nondisplaced fractures of right and left pubic bodies. There are old healed fractures of the right  inferior and superior pubic rami. Urinary Tract:  No abnormality visualized. Bowel:  Numerous diverticula in distal colon.  Otherwise negative. Vascular/Lymphatic: No pathologically enlarged lymph nodes. No significant vascular abnormality seen. Reproductive:  No mass or other significant abnormality Other:  None. IMPRESSION: 1. Acute nondisplaced intertrochanteric fracture of the proximal right femur. 2. Nondisplaced subtle fractures of the S3 segment of the sacrum and of both pubic bodies. 3. Intramuscular tear and small hematoma and strain of the right gluteus maximus muscle. Small hematoma extending into the vastus lateralis muscle. Electronically Signed   By: JLorriane ShireM.D.   On: 09/27/2018 16:45  Dg Chest Port 1 View  Result Date: 09/27/2018 CLINICAL DATA:  COPD, fall EXAM: PORTABLE CHEST 1 VIEW COMPARISON:  10/09/2017 FINDINGS: Cardiac silhouette appears within normal limits. Calcific aortic knob. Diffuse interstitial thickening throughout both lungs. Prominent right suprahilar opacity, which appears more prominent compared to prior. There is hazy left basilar opacity, some of which may represent a component of hiatal hernia, better seen on prior exam. No pneumothorax is seen. The bones are diffusely demineralized. There are multiple thoracic spine compression deformity status post cement augmentation. No definite acute osseous abnormality within the limitations of this exam. IMPRESSION: 1. Prominent right suprahilar opacity, new from prior. Further evaluation with contrast-enhanced CT of the chest is recommended. 2. Hazy left basilar opacity. Electronically Signed   By: Davina Poke M.D.   On: 09/27/2018 13:59   Dg Hip Operative Unilat W Or W/o Pelvis Right  Result Date: 09/30/2018 CLINICAL DATA:  Right hip fracture ORIF. EXAM: OPERATIVE RIGHT HIP (WITH PELVIS IF PERFORMED) TECHNIQUE: Fluoroscopic spot image(s) were submitted for interpretation post-operatively. Fluoroscopy time was 1.0  minutes. COMPARISON:  Right hip x-rays dated September 27, 2018. FINDINGS: Multiple intraoperative fluoroscopic images demonstrate interval gamma nail fixation of the right intertrochanteric femur fracture. Alignment is anatomic. IMPRESSION: 1. Intraoperative fluoroscopic guidance for right intertrochanteric femur fracture ORIF. Electronically Signed   By: Titus Dubin M.D.   On: 09/30/2018 11:28   Dg Hip Unilat With Pelvis 2-3 Views Right  Result Date: 09/27/2018 CLINICAL DATA:  73 year old female with a history of right hip pain after a fall EXAM: DG HIP (WITH OR WITHOUT PELVIS) 2-3V RIGHT COMPARISON:  None. FINDINGS: Osteopenia. Bony pelvic ring appears intact. Partially healed right inferior pubic ramus fracture. Disruption right-sided sacral foramen. Nondisplaced right intertrochanteric fracture is identified on the cross-table lateral view. Changes of prior vertebral augmentation. IMPRESSION: Nondisplaced right intertrochanteric fracture, best seen on the cross-table lateral view Questionable acute right sacral fracture with disruption of the sacral foramen. Further evaluation with pelvic MRI may be useful. Incomplete healing/remodeling of right inferior pubic ramus fracture. Osteopenia. Electronically Signed   By: Corrie Mckusick D.O.   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       -patient dropped from 140lbs to 105.  She states in past year she actually gained weight appx 5lbs     - would recommend to continue chest physiotherapy with MetaNeb to recruit atelectatic segments.       -reviewed  CXR TODAY - mild RLL atelectasis compared to 8/30 film      -patient followed by Dr RAshby Daweson outpatient basis and will be seen by him post hospitalization      Acute exacerbation of severe bilateral bronchiectasis     - prednisone has been stopped       - VEST therapy , monitor for signs of pain for hip repair, switch to METANEB if possible      - antibiotic regiment switched to meropenem IV     - nebulized mucomyst 464mbid with albuterol      -  pt moved to negative pressure room due to COVID exposure from sister who is +Covid       -d/c to rehab as per Dr Benjie Karvonen if clinically appropriate       Thank you for allowing me to participate in the care of this patient.   Patient/Family are satisfied with care plan and all questions have been answered.  This document was prepared using Dragon voice recognition software and may include unintentional dictation errors.     Ottie Glazier, M.D.  Div[facility] of [facility]

## 2018-10-05 NOTE — Progress Notes (Signed)
[place] [facility] [place] NAME: Yazmeen Woolf    MR#:  [id number]  DATE OF BIRTH:  March 07, 1945  SUBJECTIVE:  Patient agreeable to go to re[facility]    REVIEW OF SYSTEMS:    Review of Systems  Constitutional: Negative for fever, chills weight loss HENT: Negative for ear pain, nosebleeds, congestion, facial swelling, rhinorrhea, neck pain, neck stiffness and ear discharge.   Respiratory: Negative for cough, shortness of breath, wheezing  Cardiovascular: Negative for chest pain, palpitations and leg swelling.  Gastrointestinal: Negative for heartburn, abdominal pain, vomiting, diarrhea or consitpation Genitourinary: Negative for dysuria, urgency, frequency, hematuria Musculoskeletal: Negative for back pain denies hip pain Neurological: Negative for dizziness, seizures, syncope, focal weakness,  numbness and headaches.  Hematological: Does not bruise/bleed easily.  Psychiatric/Behavioral: Negative for hallucinations, confusion, dysphoric mood    Tolerating Diet: yes     DRUG ALLERGIES:   Allergies  Allergen Reactions  . Sulfa Antibiotics Diarrhea  . Codeine Nausea And Vomiting  . Penicillins Rash    Has patient had a PCN reaction causing immediate rash, facial/tongue/throat swelling, SOB or lightheadedness with hypotension: Unknown Has patient had a PCN reaction causing severe rash involving mucus membranes or skin necrosis: Unknown Has patient had a PCN reaction that required hospitalization: Unknown Has patient had a PCN reaction occurring within the last 10 years: No If all of the above answers are "NO", then may proceed with Cephalosporin use.     VITALS:  Blood pressure (!) 153/90, pulse 97, temperature 97.9 F (36.6 C), resp. rate 18, height _0  (1.651 m), weight 47.6 kg, SpO2 100 %.  PHYSICAL EXAMINATION:  Constitutional: Appears well-developed and well-nourished. No distress. HENT: Normocephalic. [name] Oropharynx is clear and moist.   Eyes: Conjunctivae and EOM are normal. PERRLA, no scleral icterus.  Neck: Normal ROM. Neck supple. No JVD. No tracheal deviation. CVS: RRR, S1/S2 +, no murmurs, no gallops, no carotid bruit.  Pulmonary: b/l rhonchii Abdominal: Soft. BS +,  no distension, tenderness, rebound or guarding.  Musculoskeletal: can move her legs Neuro: Alert. CN 2-12 grossly intact. No focal deficits. Skin: Skin is warm and dry. No rash noted. Psychiatric: Normal mood and affect.      LABORATORY PANEL:   CBC Recent Labs  Lab 10/05/18 0911  WBC 23.7*  HGB 9.2*  HCT 28.8*  PLT 392   ------------------------------------------------------------------------------------------------------------------  Chemistries  Recent Labs  Lab 10/02/18 0603  10/05/18 0911  NA 137   < > 137  K 5.4*   < > 4.8  CL 96*   < > 89*  CO2 35*   < > 37*  GLUCOSE 137*   < > 83  BUN 17   < > 29*  CREATININE 0.50   < > 0.55  CALCIUM 8.7*   < > 9.3  MG 2.2  --   --    < > = values in this interval not displayed.   ------------------------------------------------------------------------------------------------------------------  Cardiac Enzymes No results for input(s): TROPONINI in the last 168 hours. ------------------------------------------------------------------------------------------------------------------  RADIOLOGY:  No results found.   ASSESSMENT AND PLAN:    73 year old female with chronic hypoxic respiratory failure on 2 L of oxygen and CAD who presented to the emergency room due to mechanical fall and right sided hip pain.  1.  Nondisplaced right intertrochanteric fracture:  5 Day Post-Op Procedure(s) (LRB): INTRAMEDULLARY (IM) NAIL INTERTROCHANTRIC  DVT prophylaxis with Eliquis, TED hose and SCDs Weightbearing as tolerated to right leg She is s/p  1 unit PRBC due to acute postop blood loss with hemoglobin over 9  this am.    2.  Acute on chronic hypoxic respiratory failure with history of  bronchiectasis/MAC and left lower lobe resection and acute exacerbation of severe bilateral bronchiectasis: Pulmonary consult appreciated Wean oral steroids as tolerated.    Continue azithromycin and ciprofloxacin for prophylaxis due to history of chronic Mycobacterium AVM infection.  No evidence of pneumonia or COPD exacerbation as per pulmonary.   3.  CAD: Continue metoprolol  4.  Essential hypertension: Continue diltiazem and metoprolol Holding Norvasc and losartan due to normal blood pressure.    5.  PAF: Patient currently in normal sinus rhythm Continue Eliquis. Continue diltiazem and metoprolol for heart rate control.  6.  ESBL UTI: She has completed course of meropenem for 5 days.  7. Elevated WBC: no Fever Will repeat in am   management plans discussed with the patient and she is in agreement.  CODE STATUS: dnr  TOTAL TIME TAKING CARE OF THIS PATIENT: 25 minutes.   Patient ok to go to SNF today Waiting to hear from insurance.   POSSIBLE D/C tomorrow DEPENDING ON CLINICAL CONDITION.   Bettey Costa M.D on 10/05/2018 at 10:33 AM  Between 7am to 6pm - Pager - [phone] After 6pm go to [email or web address] - password [email or web address] [facility]  Office  [phone]  CC: Primary care physician; [name], MD  Note: This dictation was prepared with Dragon dictation along with smaller phrase technology. Any transcriptional errors that result from this process are unintentional.

## 2018-10-05 NOTE — TOC Progression Note (Signed)
Transition of Care [facility]) - Progression Note    Patient Details  Name: Ana Washington MRN: [id number] Date of Birth: 1945-07-12  Transition of Care [facility]) CM/SW [place], RN Phone Number: 10/05/2018, 9:00 AM  Clinical Narrative:    Spoke with the patient on the phone I explained the options to her She can go to rehab that is a covid facility due to the fact that she has been exposed I agreed to seek  Rehab facilities and see if they will accept the patient being as it has been 7 days since the exposure and will see if they will accept if get a negative test  She agrees to the search for rehab The patient has her husband at home  She hires a care giver 4 days a week for 6 hours a day and can increase this some She has DME at home and does not need more I encouraged her to work with PT today and try to get to the bathroom and back walking.  I explained that we want her to be able to go home safely as she wants to do and walking to the bathroom would show she could do so at home as well.     Expected Discharge Plan: [place] Barriers to Discharge: Continued Medical Work up  Expected Discharge Plan and Services Expected Discharge Plan: [place] In-house Referral: Clinical Social Work Discharge Planning Services: CM Consult   Living arrangements for the past 2 months: Single Family Home Expected Discharge Date: 09/30/18                                     Social Determinants of Health (SDOH) Interventions    Readmission Risk Interventions Readmission Risk Prevention Plan 01/06/2018  Transportation Screening Complete  PCP or Specialist Appt within 5-7 Days Complete  Home Care Screening Complete  Medication Review (RN CM) Complete  Some recent data might be hidden

## 2018-10-05 NOTE — Progress Notes (Signed)
   10/05/18 1100  Clinical Encounter Type  Visited With Patient;Health care provider  Visit Type Initial  Referral From Nurse  Consult/Referral To Chaplain  Stress Factors  Patient Stress Factors Health changes;Loss of control   Chaplain received a referral from the patient's nurse and the unit secretary to visit with her. Due to her current COVID-19 rule-out status, this chaplain called the patient via phone. The patient was polite, welcoming, and conversational. The patient reported that she "wasn't doing very well physically or mentally today." The patient shared what necessitated her hospital stay and the difficulty of being on restrictions while she is ruled-out. The patient's sister is discharging from the hospital today and she is grateful. This chaplain joined in the patient's  celebration while acknowledging the other difficulties she is experiencing in isolation. The patient expressed gratitude for the call and would appreciate later follow-up.

## 2018-10-05 NOTE — Consult Note (Signed)
ORTHOPAEDICS PROGRESS NOTE  PATIENT NAME: Ana Washington DOB: [birth year]  MRN: [id number]  POD # 5: ORIF right Intertrochanteric femur fracture   Subjective: The patient is more alert today. She denies any significant hip pain, but doe complain of right knee pain and swelling. She cannot localize the knee pain and states that the knee throbs even at rest. Poor progress with Physical Therapy,  The patient has agreed to Skilled Nursing at [facility]. Awaiting authorization by insurance.  Objective: Vital signs in last 24 hours: Temp:  [97.8 F (36.6 C)-97.9 F (36.6 C)] 97.9 F (36.6 C) (09/04 0856) Pulse Rate:  [74-97] 92 (09/04 1300) Resp:  [18-19] 18 (09/04 0856) BP: (107-153)/(77-90) 125/77 (09/04 1300) SpO2:  [99 %-100 %] 100 % (09/04 1329)  Intake/Output from previous day: 09/03 0701 - 09/04 0700 In: 400 [IV Piggyback:400] Out: 1100 [Urine:1100]  Recent Labs    10/03/18 0411 10/04/18 0552 10/05/18 0911  WBC 12.0* 11.1* 23.7*  HGB 7.4* 9.7* 9.2*  HCT 23.2* 29.2* 28.8*  PLT 213 226 392  K 5.0  --  4.8  CL 94*  --  89*  CO2 36*  --  37*  BUN 20  --  29*  CREATININE 0.51  --  0.55  GLUCOSE 118*  --  83  CALCIUM 8.6*  --  9.3    EXAM General: Well developed, well nourished frail-appearing female seen i Right extremity: Right hip dressing is intact . No gross swelling or ecchymosis. No erythema or increased warmth. The right knee demonstrated mild soft tissue swelling and trace effusion. Stable to varus and valgus stress.Nonfocal tenderness. Neurologic: Awake, alert, and oriented. Sensory and motor function are grossly intact.   Assessment: ORIF right Intertrochanteric femur fracture   Plan: Will obtain x-rays of the right knee. Plan is to go Skilled nursing facility after hospital stay.  James P. Holley Bouche M.D.

## 2018-10-05 NOTE — TOC Progression Note (Addendum)
Transition of Care [facility]) - Progression Note    Patient Details  Name: Ana Washington MRN: [id number] Date of Birth: [birth year]  Transition of Care [facility]) CM/SW [place], RN Phone Number: 10/05/2018, 9:45 AM  Clinical Narrative:    Damaris Schooner with Claiborne Billings at [facility], explained the issues with the patient being exposed 7 days ago, The patient test negative 5 days ago,  she would be willing to accept the patient however, [facility] is out of Network with [facility] I left a voice mail for a return call with Hilda Blades at [facility] called Liliane Channel at [facility] and they are not in network with [facility] either I called Tina at [facility] and they agree to take a look to see if they can accept. They are INN with [employer], I notified the patient that [facility] will take a look and may offer a bed.  She is in agreeance and will speak to her husband about it, she did ask about [facility], I explained that they are not accepting patients on Nebulizers     Expected Discharge Plan: [place] Barriers to Discharge: Continued Medical Work up  Expected Discharge Plan and Services Expected Discharge Plan: [place] In-house Referral: Clinical Social Work Discharge Planning Services: CM Consult   Living arrangements for the past 2 months: Single Family Home Expected Discharge Date: 09/30/18                                     Social Determinants of Health (SDOH) Interventions    Readmission Risk Interventions Readmission Risk Prevention Plan 01/06/2018  Transportation Screening Complete  PCP or Specialist Appt within 5-7 Days Complete  Home Care Screening Complete  Medication Review (RN CM) Complete  Some recent data might be hidden

## 2018-10-05 NOTE — TOC Progression Note (Signed)
Transition of Care [facility]) - Progression Note    Patient Details  Name: REANNAH LOSANO MRN: [id number] Date of Birth: 1945-09-06  Transition of Care [facility]) CM/SW [place], RN Phone Number: 10/05/2018, 11:45 AM  Clinical Narrative:     Spoke with the patient on the phone and explained that I would need to reserve the bed and start the insurance authorization process today if she is planning to go to Rehab.  I explained that if she wants to go home with Home health I would set that up for her.  I need to know what her choice is today so that we can start the process, she took down my number and agreed to call me by 2 PM with a choice of what she would like to do  Expected Discharge Plan: [place] Barriers to Discharge: Continued Medical Work up  Expected Discharge Plan and Services Expected Discharge Plan: [place] In-house Referral: Clinical Social Work Discharge Planning Services: CM Consult   Living arrangements for the past 2 months: Single Family Home Expected Discharge Date: 09/30/18                                     Social Determinants of Health (SDOH) Interventions    Readmission Risk Interventions Readmission Risk Prevention Plan 01/06/2018  Transportation Screening Complete  PCP or Specialist Appt within 5-7 Days Complete  Home Care Screening Complete  Medication Review (RN CM) Complete  Some recent data might be hidden

## 2018-10-06 LAB — IRON AND TIBC
Iron: 72 ug/dL (ref 28–170)
Saturation Ratios: 29 % (ref 10.4–31.8)
TIBC: 248 ug/dL — ABNORMAL LOW (ref 250–450)
UIBC: 176 ug/dL

## 2018-10-06 LAB — BASIC METABOLIC PANEL
Anion gap: 9 (ref 5–15)
BUN: 28 mg/dL — ABNORMAL HIGH (ref 8–23)
CO2: 37 mmol/L — ABNORMAL HIGH (ref 22–32)
Calcium: 8.8 mg/dL — ABNORMAL LOW (ref 8.9–10.3)
Chloride: 88 mmol/L — ABNORMAL LOW (ref 98–111)
Creatinine, Ser: 0.47 mg/dL (ref 0.44–1.00)
GFR calc Af Amer: >60 mL/min (ref >60)
GFR calc non Af Amer: >60 mL/min (ref >60)
Glucose, Bld: 83 mg/dL (ref 70–99)
Potassium: 4 mmol/L (ref 3.5–5.1)
Sodium: 134 mmol/L — ABNORMAL LOW (ref 135–145)

## 2018-10-06 LAB — FERRITIN: Ferritin: 95 ng/mL (ref 11–307)

## 2018-10-06 LAB — RETICULOCYTES
Immature Retic Fract: 27.4 % — ABNORMAL HIGH (ref 2.3–15.9)
RBC.: 2.58 MIL/uL — ABNORMAL LOW (ref 3.87–5.11)
Retic Count, Absolute: 141.9 10*3/uL (ref 19.0–186.0)
Retic Ct Pct: 5.5 % — ABNORMAL HIGH (ref 0.4–3.1)

## 2018-10-06 LAB — VITAMIN B12: Vitamin B-12: 371 pg/mL (ref 180–914)

## 2018-10-06 LAB — CBC
HCT: 24.6 % — ABNORMAL LOW (ref 36.0–46.0)
Hemoglobin: 8.2 g/dL — ABNORMAL LOW (ref 12.0–15.0)
MCH: 33.3 pg (ref 26.0–34.0)
MCHC: 33.3 g/dL (ref 30.0–36.0)
MCV: 100 fL (ref 80.0–100.0)
Platelets: 361 10*3/uL (ref 150–400)
RBC: 2.46 MIL/uL — ABNORMAL LOW (ref 3.87–5.11)
RDW: 13.8 % (ref 11.5–15.5)
WBC: 16.5 10*3/uL — ABNORMAL HIGH (ref 4.0–10.5)
nRBC: 0 % (ref 0.0–0.2)

## 2018-10-06 LAB — FOLATE: Folate: 15.5 ng/mL (ref >5.9)

## 2018-10-06 MED ORDER — TRAMADOL HCL 50 MG PO TABS: 50.0000 mg | ORAL_TABLET | ORAL | 0 refills | Status: DC | PRN

## 2018-10-06 NOTE — Progress Notes (Signed)
[facility] [place] NAME: Ana Washington    MR#:  [id number]  DATE OF BIRTH:  Apr 20, 1945  SUBJECTIVE:  No acute events overnight.  We are still waiting insurance approval for rehab placement.  REVIEW OF SYSTEMS:    Review of Systems  Constitutional: Negative for fever, chills weight loss HENT: Negative for ear pain, nosebleeds, congestion, facial swelling, rhinorrhea, neck pain, neck stiffness and ear discharge.   Respiratory: Negative for cough, shortness of breath, wheezing  Cardiovascular: Negative for chest pain, palpitations and leg swelling.  Gastrointestinal: Negative for heartburn, abdominal pain, vomiting, diarrhea or consitpation Genitourinary: Negative for dysuria, urgency, frequency, hematuria Musculoskeletal: Negative for back pain denies hip pain Neurological: Negative for dizziness, seizures, syncope, focal weakness,  numbness and headaches.  Hematological: Does not bruise/bleed easily.  Psychiatric/Behavioral: Negative for hallucinations, confusion, dysphoric mood    Tolerating Diet: yes     DRUG ALLERGIES:   Allergies  Allergen Reactions  . Sulfa Antibiotics Diarrhea  . Codeine Nausea And Vomiting  . Penicillins Rash    Has patient had a PCN reaction causing immediate rash, facial/tongue/throat swelling, SOB or lightheadedness with hypotension: Unknown Has patient had a PCN reaction causing severe rash involving mucus membranes or skin necrosis: Unknown Has patient had a PCN reaction that required hospitalization: Unknown Has patient had a PCN reaction occurring within the last 10 years: No If all of the above answers are "NO", then may proceed with Cephalosporin use.     VITALS:  Blood pressure 131/76, pulse 91, temperature 98.8 F (37.1 C), temperature source Oral, resp. rate 18, height _0  (1.651 m), weight 47.6 kg, SpO2 100 %.  PHYSICAL EXAMINATION:  Constitutional: Appears well-developed and  well-nourished. No distress. HENT: Normocephalic. [name] Oropharynx is clear and moist.  Eyes: Conjunctivae and EOM are normal. PERRLA, no scleral icterus.  Neck: Normal ROM. Neck supple. No JVD. No tracheal deviation. CVS: RRR, S1/S2 +, no murmurs, no gallops, no carotid bruit.  Pulmonary: b/l rhonchii Abdominal: Soft. BS +,  no distension, tenderness, rebound or guarding.  Musculoskeletal: can move her legs Neuro: Alert. CN 2-12 grossly intact. No focal deficits. Skin: Skin is warm and dry. No rash noted. Psychiatric: Normal mood and affect.      LABORATORY PANEL:   CBC Recent Labs  Lab 10/06/18 0346  WBC 16.5*  HGB 8.2*  HCT 24.6*  PLT 361   ------------------------------------------------------------------------------------------------------------------  Chemistries  Recent Labs  Lab 10/02/18 0603  10/06/18 0346  NA 137   < > 134*  K 5.4*   < > 4.0  CL 96*   < > 88*  CO2 35*   < > 37*  GLUCOSE 137*   < > 83  BUN 17   < > 28*  CREATININE 0.50   < > 0.47  CALCIUM 8.7*   < > 8.8*  MG 2.2  --   --    < > = values in this interval not displayed.   ------------------------------------------------------------------------------------------------------------------  Cardiac Enzymes No results for input(s): TROPONINI in the last 168 hours. ------------------------------------------------------------------------------------------------------------------  RADIOLOGY:  Dg Chest Port 1 View  Result Date: 10/05/2018 CLINICAL DATA:  Pulmonary infiltrates. EXAM: PORTABLE CHEST 1 VIEW COMPARISON:  CT scan and chest x-ray dated 09/30/2018 and chest x-ray dated 09/27/2018 FINDINGS: The hazy areas of infiltrate lungs have improved bilaterally. Postsurgical changes in the right upper lobe are noted with surgical staples. Huge hiatal hernia appears unchanged. Chronic bronchiectasis is not as apparent on  chest x-ray as on the CT scan. Heart size and pulmonary vascularity are normal. No acute  bone abnormality. IMPRESSION: Improving bilateral infiltrates. Electronically Signed   By: Lorriane Shire M.D.   On: 10/05/2018 11:38   Dg Knee Right Port  Result Date: 10/05/2018 CLINICAL DATA:  Right knee pain. No reported injury. EXAM: PORTABLE RIGHT KNEE - 1-2 VIEW COMPARISON:  None. FINDINGS: Medial and lateral meniscal calcifications. Minimal posterior patellar spur formation. Minimal effusion. The bones appear osteopenic. Arterial calcifications. IMPRESSION: 1. Minimal degenerative changes and minimal effusion. 2. Chondrocalcinosis. Electronically Signed   By: Claudie Revering M.D.   On: 10/05/2018 18:46     ASSESSMENT AND PLAN:    73 year old female with chronic hypoxic respiratory failure on 2 L of oxygen and CAD who presented to the emergency room due to mechanical fall and right sided hip pain.  1.  Nondisplaced right intertrochanteric fracture:  6 Day Post-Op Procedure(s) (LRB): INTRAMEDULLARY (IM) NAIL INTERTROCHANTRIC  DVT prophylaxis with Eliquis TED hose BLE x 6 weeks, remove at night time  Weightbearing as tolerated to right leg She is s/p 1 unit PRBC due to acute postop blood loss with hemoglobin 8.2 this am Remove staples and apply Steri-Strips on October 14, 2018 Follow-up with [facility] orthopedics 2 to 4 weeks.  2.  Acute on chronic hypoxic respiratory failure with history of bronchiectasis/MAC and left lower lobe resection and acute exacerbation of severe bilateral bronchiectasis: This is stable. She will need to continue azithromycin and ciprofloxacin for prophylaxis due to history of chronic Mycobacterium AVM infection.     3.  CAD: Continue metoprolol  4.  Essential hypertension: Continue diltiazem and metoprolol Holding Norvasc and losartan due to normal blood pressure.    5.  PAF: Patient currently in normal sinus rhythm Continue Eliquis. Continue diltiazem and metoprolol for heart rate control.  6.  ESBL UTI: She has completed course of meropenem for 5  days.  7. Elevated WBC without Fever: This was reactive. CBC has improved   management plans discussed with the patient and she is in agreement.  CODE STATUS: dnr  TOTAL TIME TAKING CARE OF THIS PATIENT: 25 minutes.   Patient ok to go to SNF today Waiting to hear from insurance.   POSSIBLE D/C when insurance approval    Bettey Costa M.D on 10/06/2018 at 9:47 AM  Between 7am to 6pm - Pager - [phone] After 6pm go to [email or web address] - password [email or web address] [place] [facility]  Office  [phone]  CC: Primary care physician; [name], MD  Note: This dictation was prepared with Dragon dictation along with smaller phrase technology. Any transcriptional errors that result from this process are unintentional.

## 2018-10-06 NOTE — Progress Notes (Signed)
   Subjective: 6 Days Post-Op Procedure(s) (LRB): INTRAMEDULLARY (IM) NAIL INTERTROCHANTRIC (Right) Patient reports pain as mild.  Right knee swelling and pain.  Ice is helping.  Patient is well, and has had no acute complaints or problems Denies any CP,  ABD pain. SOB at baseline We will continue with physical therapy today.   Objective: Vital signs in last 24 hours: Temp:  [97.8 F (36.6 C)-98.8 F (37.1 C)] 98.8 F (37.1 C) (09/04 2303) Pulse Rate:  [88-97] 91 (09/04 2303) Resp:  [18-20] 18 (09/04 2303) BP: (125-153)/(76-90) 131/76 (09/04 2303) SpO2:  [100 %] 100 % (09/04 2303)  Intake/Output from previous day: 09/04 0701 - 09/05 0700 In: -  Out: 700 [Urine:700] Intake/Output this shift: Total I/O In: -  Out: 200 [Urine:200]  Recent Labs    10/04/18 0552 10/05/18 0911 10/06/18 0346  HGB 9.7* 9.2* 8.2*   Recent Labs    10/05/18 0911 10/06/18 0346  WBC 23.7* 16.5*  RBC 2.85* 2.46*  HCT 28.8* 24.6*  PLT 392 361   Recent Labs    10/05/18 0911 10/06/18 0346  NA 137 134*  K 4.8 4.0  CL 89* 88*  CO2 37* 37*  BUN 29* 28*  CREATININE 0.55 0.47  GLUCOSE 83 83  CALCIUM 9.3 8.8*   No results for input(s): LABPT, INR in the last 72 hours.  EXAM General - Patient is Alert, Appropriate and Oriented Extremity - Neurovascular intact Sensation intact distally Intact pulses distally Dorsiflexion/Plantar flexion intact No cellulitis present Compartment soft  Quadricep muscle soft, no defect in the quad tendon.  Patient is able to straight leg raise Dressing - dressing C/D/I and no drainage Motor Function - intact, moving foot and toes well on exam.   Past Medical History:  Diagnosis Date  . Arthritis   . Atrial fibrillation ([place])   . Bronchiectasis ([place])   . CAD (coronary artery disease)   . CAD (coronary artery disease) 07/30/2017  . Dumping syndrome   . Dyspnea   . Dysrhythmia    afib  . 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 Family history of  adverse reaction to anesthesia    sister [name]  . GERD (gastroesophageal reflux disease)   . Headache    MIGRAINES  . Hypertension   . Lung disease   . Myocardial infarction ([place]) 2007   Non-STEMI  . [name] (postoperative nausea and vomiting)   . Psoriasis   . PUD (peptic ulcer disease)     Assessment/Plan:   6 Days Post-Op Procedure(s) (LRB): INTRAMEDULLARY (IM) NAIL INTERTROCHANTRIC (Right) Active Problems:   Closed right hip fracture (HCC)   Acute post op blood loss anemia   Estimated body mass index is 17.47 kg/m as calculated from the following:   Height as of this encounter: 5\' 5"  (1.651 m).   Weight as of this encounter: 47.6 kg. Advance diet Up with therapy  Needs BM Acute post op blood loss anemia - Hgb 8.2 today s/p 1 unit of PRBC on 10/03/2018. COVID test negative yesterday. Pain well controlled CM to assist with discharge. Patient refusing SNF but unable to safely go home.   Remove staples and apply steri strips on 10/14/2018 Follow up with [facility] orthopedics in 6 weeks TED hose BLE x 6 weeks, remove at night time  DVT Prophylaxis - TED hose and SCDs, Eliquis Weight-Bearing as tolerated to right leg   Reche Dixon, PA-C [place] 10/06/2018, 6:38 AM

## 2018-10-06 NOTE — Progress Notes (Signed)
Received call from [facility] RN to F/U on patient's COVID test results. Per [facility] RN, Junious Dresser, she is following patient's husband due to him testing positive for COVID-19. It is known this patient's sister has tested positive as well. However, apparently patient's last encounter with her sister and husband was 8/27. Per the nurse at [facility] signs and symptoms begin to present within a 6-10 day window of exposure. It is the nurse's recommendation that we continue to utilize airborne isolation precautions throughout the patient's hospitalization.   Most importantly, the reason for the [facility] RN calling was to be sure we share her contact information with the patient's  accepting facility. Currently the accepting facility is the [place], [place]. Insurance Josem Kaufmann is pending at this time. I will place a sticky note in the patient's hard chart with the following information as well.   [facility] Dept Nurse, for this patient's  husband:    Junious Dresser   ([phone]

## 2018-10-06 NOTE — TOC Progression Note (Signed)
Transition of Care [facility]) - Progression Note    Patient Details  Name: Ana Washington MRN: [id number] Date of Birth: 06/23/1945  Transition [facility] Care [facility]) CM/SW Contact  Shelbie Hutching, RN Phone Number: 10/06/2018, 12:47 PM  Clinical Narrative:    Waiting on insurance authorization for SNF.    Expected Discharge Plan: [place] Barriers to Discharge: Continued Medical Work up  Expected Discharge Plan and Services Expected Discharge Plan: [place] In-house Referral: Clinical Social Work Discharge Planning Services: CM Consult   Living arrangements for the past 2 months: Single Family Home Expected Discharge Date: 10/06/18                                     Social Determinants of Health (SDOH) Interventions    Readmission Risk Interventions Readmission Risk Prevention Plan 01/06/2018  Transportation Screening Complete  PCP or Specialist Appt within 5-7 Days Complete  Home Care Screening Complete  Medication Review (RN CM) Complete  Some recent data might be hidden

## 2018-10-06 NOTE — Progress Notes (Addendum)
PT Cancellation Note  Patient Details Name: Ana Washington MRN: [id number] DOB: 1945-06-09   Cancelled Treatment:    Reason Eval/Treat Not Completed: (Treatment session attempted.  Upon arrival to room, patient inquiring about lunch tray (requested one hour earlier). Tray located per patient request.  Once in room, patient requesting to eat.  Declined OOB to chair for meal or any attempt at therex at this time.  Will continue efforts at later time/date as medically appropriate and available.)  Of note, per discussion with primary RN, patient to remain on airborne/contact isolation throughout hospitalization due to high risk exposure (sister and patient husband with positive test results).   Vernal Rutan H. Owens Shark, PT, DPT, NCS 10/06/18, 3:24 PM [phone]

## 2018-10-06 NOTE — Discharge Summary (Addendum)
Ana [facility] at [place] NAME: Ana Washington    MR#:  [id number]  DATE OF BIRTH:  1945/06/30  DATE OF ADMISSION:  09/27/2018 ADMITTING PHYSICIAN: Ana Loll, MD  DATE OF DISCHARGE: 10/11/2018   PRIMARY CARE PHYSICIAN: Ana Pink, MD    ADMISSION DIAGNOSIS:  Fall, initial encounter [W19.XXXA] Closed fracture of right hip, initial encounter ([place]) [S72.001A]  DISCHARGE DIAGNOSIS:  Active Problems:   Closed right hip fracture ([place])   SECONDARY DIAGNOSIS:   Past Medical History:  Diagnosis Date  . Arthritis   . Atrial fibrillation ([place])   . Bronchiectasis ([place])   . CAD (coronary artery disease)   . CAD (coronary artery disease) 07/30/2017  . Dumping syndrome   . Dyspnea   . Dysrhythmia    afib  . 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 Family history of adverse reaction to anesthesia    sister [name]  . GERD (gastroesophageal reflux disease)   . Headache    MIGRAINES  . Hypertension   . Lung disease   . Myocardial infarction ([place]) 2007   Non-STEMI  . [name] (postoperative nausea and vomiting)   . Psoriasis   . PUD (peptic ulcer disease)     HOSPITAL COURSE:   73 year old female with chronic hypoxic respiratory failure on 2 L of oxygen and CAD who presented to the emergency room due to mechanical fall and right sided hip pain.  1.  Nondisplaced right intertrochanteric fracture:  11 Day Post-Op Procedure(s) (LRB): INTRAMEDULLARY (IM) NAIL INTERTROCHANTRIC  DVT prophylaxis with Eliquis  TED hose BLE x 6 weeks, remove at night time  Weightbearing as tolerated to right leg She is s/p 2 unit PRBC due to acute postop blood loss.Anemia panel consistent with chronic disease. Remove staples and apply Steri-Strips on October 14, 2018 Follow-up with [facility] orthopedics 2 to 4 weeks.  2.  Acute on chronic hypoxic respiratory failure with history of bronchiectasis/MAC and left lower lobe resection and acute exacerbation of severe  bilateral bronchiectasis which has now resolved:  She will need to continue azithromycin and ciprofloxacin for prophylaxis due to history of chronic Mycobacterium AVM infection.  3.  CAD: She should continue metoprolol  4.  Essential hypertension: her blood pressure is acceptable. She will continue diltiazem and metoprolol     5.  pAFib with RVR Continue Eliquis. Continue diltiazem and metoprolol for heart rate control. Doses increased during hospital stay  6.  ESBL UTI: She has completed course of meropenem for 5 days.  7. Elevated WBC without Fever: This was reactive. CBC has improved  COVID negative  Stable for discharge to SNF   DISCHARGE CONDITIONS AND DIET:   Stable Regular heart healthy diet  CONSULTS OBTAINED:  Treatment Team:  Dereck Leep, MD Ottie Glazier, MD Dionisio David, MD  DRUG ALLERGIES:   Allergies  Allergen Reactions  . Sulfa Antibiotics Diarrhea  . Codeine Nausea And Vomiting  . Penicillins Rash    Has patient had a PCN reaction causing immediate rash, facial/tongue/throat swelling, SOB or lightheadedness with hypotension: Unknown Has patient had a PCN reaction causing severe rash involving mucus membranes or skin necrosis: Unknown Has patient had a PCN reaction that required hospitalization: Unknown Has patient had a PCN reaction occurring within the last 10 years: No If all of the above answers are "NO", then may proceed with Cephalosporin use.     DISCHARGE MEDICATIONS:   Allergies as of 10/11/2018      Reactions  Sulfa Antibiotics Diarrhea   Codeine Nausea And Vomiting   Penicillins Rash   Has patient had a PCN reaction causing immediate rash, facial/tongue/throat swelling, SOB or lightheadedness with hypotension: Unknown Has patient had a PCN reaction causing severe rash involving mucus membranes or skin necrosis: Unknown Has patient had a PCN reaction that required hospitalization: Unknown Has patient had a PCN reaction  occurring within the last 10 years: No If all of the above answers are "NO", then may proceed with Cephalosporin use.      Medication List    STOP taking these medications   aspirin EC 81 MG tablet   losartan 100 MG tablet Commonly known as: COZAAR     TAKE these medications   acetaminophen 500 MG tablet Commonly known as: TYLENOL Take 1,000 mg by mouth every 6 (six) hours as needed for mild pain or headache. Notes to patient: Last dose was given today at 10:33 AM   Align 4 MG Caps Take 4 mg by mouth daily. Notes to patient: Last dose was given today at 10:31 AM   amLODipine 5 MG tablet Commonly known as: NORVASC Take 5 mg by mouth daily. Notes to patient: Last dose was given today at 10:33 AM   apixaban 5 MG Tabs tablet Commonly known as: ELIQUIS Take 5 mg by mouth 2 (two) times daily. Notes to patient: Last dose was given today at 10:33 AM   azithromycin 250 MG tablet Commonly known as: ZITHROMAX Take 250 mg by mouth daily. Notes to patient: Last dose was given today at 10:34 AM   chlorpheniramine-HYDROcodone 10-8 MG/5ML Suer Commonly known as: TUSSIONEX Take 5 mLs by mouth at bedtime. Notes to patient: Last dose was given yesterday at 9:15 PM   ciprofloxacin 500 MG tablet Commonly known as: CIPRO Take 500 mg by mouth 2 (two) times daily. Notes to patient: Last dose was given today at 10:31 AM   diltiazem 180 MG 24 hr capsule Commonly known as: CARDIZEM CD Take 1 capsule (180 mg total) by mouth daily. What changed:   medication strength  how much to take   diltiazem 30 MG tablet Commonly known as: CARDIZEM Take 30 mg by mouth as needed for shortness of breath. What changed: Another medication with the same name was removed. Continue taking this medication, and follow the directions you see here. Notes to patient: Last dose was given today at 10:32 AM   ferrous sulfate 325 (65 FE) MG EC tablet Take 325 mg by mouth daily. Notes to patient: Last dose  was given yesterday at 5:42 PM   gabapentin 300 MG capsule Commonly known as: NEURONTIN Take 300 mg by mouth 4 (four) times daily. Notes to patient: Last dose was given today at 10:33 AM   LORazepam 0.5 MG tablet Commonly known as: ATIVAN Take 1 tablet (0.5 mg total) by mouth every 4 (four) hours.   metoprolol succinate 100 MG 24 hr tablet Commonly known as: TOPROL-XL Take 1 tablet (100 mg total) by mouth daily. What changed:   medication strength  how much to take  when to take this   Oyster Shell/Vitamin D 600-125 MG-UNIT Tabs Take 1 tablet by mouth daily. Notes to patient: Last dose was given today at 1:12 PM   pantoprazole 40 MG tablet Commonly known as: PROTONIX Take 1 tablet (40 mg total) by mouth daily. Notes to patient: Last dose was given today at 10:33 AM   potassium chloride 10 MEQ tablet Commonly known as: K-DUR Take 10 mEq  by mouth daily. Notes to patient: Not given in hospital   Sodium Chloride (Inhalant) 7 % Nebu Inhale 4 mLs into the lungs 3 (three) times daily. Notes to patient: Last dose was given today at 8:45 AM   sucralfate 1 g tablet Commonly known as: CARAFATE Take 1 g by mouth 4 (four) times daily. Notes to patient: Last dose was given today at 10:33 AM   traMADol 50 MG tablet Commonly known as: ULTRAM Take 1-2 tablets (50-100 mg total) by mouth every 4 (four) hours as needed for moderate pain. Notes to patient: Last dose was given yesterday at 2:41 PM   vitamin C 1000 MG tablet Take 1,000 mg by mouth daily. Notes to patient: Last dose was given today at 10:38 AM         Today   CHIEF COMPLAINT:  Doing ok this am  No issues overnight   VITAL SIGNS:  Blood pressure 132/79, pulse 81, temperature 99.1 F (37.3 C), resp. rate 18, height _0  (1.651 m), weight 47.6 kg, SpO2 96 %.   REVIEW OF SYSTEMS:  Review of Systems  Constitutional: Negative.  Negative for chills, fever and malaise/fatigue.  HENT: Negative.  Negative  for ear discharge, ear pain, hearing loss, nosebleeds and sore throat.   Eyes: Negative.  Negative for blurred vision and pain.  Respiratory: Negative for cough, hemoptysis, shortness of breath and wheezing.   Cardiovascular: Negative.  Negative for chest pain, palpitations and leg swelling.  Gastrointestinal: Negative.  Negative for abdominal pain, blood in stool, diarrhea, nausea and vomiting.  Genitourinary: Negative.  Negative for dysuria.  Musculoskeletal: Negative.  Negative for back pain.  Skin: Negative.   Neurological: Negative for dizziness, tremors, speech change, focal weakness, seizures and headaches.  Endo/Heme/Allergies: Negative.  Does not bruise/bleed easily.  Psychiatric/Behavioral: Negative.  Negative for depression, hallucinations and suicidal ideas.     PHYSICAL EXAMINATION:  GENERAL:  73 y.o.-year-old patient lying in the bed with no acute distress.  NECK:  Supple, no jugular venous distention. No thyroid enlargement, no tenderness.  LUNGS: Normal breath sounds bilaterally, no wheezing, rales,rhonchi  No use of accessory muscles of respiration.  CARDIOVASCULAR: S1, S2 normal. No murmurs, rubs, or gallops.  ABDOMEN: Soft, non-tender, non-distended. Bowel sounds present. No organomegaly or mass.  EXTREMITIES: No pedal edema, cyanosis, or clubbing.  PSYCHIATRIC: The patient is alert and oriented x 3.  SKIN: No obvious rash, lesion, or ulcer.   DATA REVIEW:   CBC Recent Labs  Lab 10/10/18 0756  WBC 10.7*  HGB 8.9*  HCT 26.7*  PLT 397    Chemistries  Recent Labs  Lab 10/09/18 0655  NA 137  K 3.7  CL 91*  CO2 39*  GLUCOSE 89  BUN 13  CREATININE 0.45  CALCIUM 8.6*    Cardiac Enzymes No results for input(s): TROPONINI in the last 168 hours.  Microbiology Results  _1 @  RADIOLOGY:  Dg Knee Right Port  Result Date: 10/05/2018 CLINICAL DATA:  Right knee pain. No reported injury. EXAM: PORTABLE RIGHT KNEE - 1-2 VIEW COMPARISON:  None.  FINDINGS: Medial and lateral meniscal calcifications. Minimal posterior patellar spur formation. Minimal effusion. The bones appear osteopenic. Arterial calcifications. IMPRESSION: 1. Minimal degenerative changes and minimal effusion. 2. Chondrocalcinosis. Electronically Signed   By: Claudie Revering M.D.   On: 10/05/2018 18:46      Allergies as of 10/11/2018      Reactions   Sulfa Antibiotics Diarrhea   Codeine Nausea And Vomiting   Penicillins  Rash   Has patient had a PCN reaction causing immediate rash, facial/tongue/throat swelling, SOB or lightheadedness with hypotension: Unknown Has patient had a PCN reaction causing severe rash involving mucus membranes or skin necrosis: Unknown Has patient had a PCN reaction that required hospitalization: Unknown Has patient had a PCN reaction occurring within the last 10 years: No If all of the above answers are "NO", then may proceed with Cephalosporin use.      Medication List    STOP taking these medications   aspirin EC 81 MG tablet   losartan 100 MG tablet Commonly known as: COZAAR     TAKE these medications   acetaminophen 500 MG tablet Commonly known as: TYLENOL Take 1,000 mg by mouth every 6 (six) hours as needed for mild pain or headache. Notes to patient: Last dose was given today at 10:33 AM   Align 4 MG Caps Take 4 mg by mouth daily. Notes to patient: Last dose was given today at 10:31 AM   amLODipine 5 MG tablet Commonly known as: NORVASC Take 5 mg by mouth daily. Notes to patient: Last dose was given today at 10:33 AM   apixaban 5 MG Tabs tablet Commonly known as: ELIQUIS Take 5 mg by mouth 2 (two) times daily. Notes to patient: Last dose was given today at 10:33 AM   azithromycin 250 MG tablet Commonly known as: ZITHROMAX Take 250 mg by mouth daily. Notes to patient: Last dose was given today at 10:34 AM   chlorpheniramine-HYDROcodone 10-8 MG/5ML Suer Commonly known as: TUSSIONEX Take 5 mLs by mouth at  bedtime. Notes to patient: Last dose was given yesterday at 9:15 PM   ciprofloxacin 500 MG tablet Commonly known as: CIPRO Take 500 mg by mouth 2 (two) times daily. Notes to patient: Last dose was given today at 10:31 AM   diltiazem 180 MG 24 hr capsule Commonly known as: CARDIZEM CD Take 1 capsule (180 mg total) by mouth daily. What changed:   medication strength  how much to take   diltiazem 30 MG tablet Commonly known as: CARDIZEM Take 30 mg by mouth as needed for shortness of breath. What changed: Another medication with the same name was removed. Continue taking this medication, and follow the directions you see here. Notes to patient: Last dose was given today at 10:32 AM   ferrous sulfate 325 (65 FE) MG EC tablet Take 325 mg by mouth daily. Notes to patient: Last dose was given yesterday at 5:42 PM   gabapentin 300 MG capsule Commonly known as: NEURONTIN Take 300 mg by mouth 4 (four) times daily. Notes to patient: Last dose was given today at 10:33 AM   LORazepam 0.5 MG tablet Commonly known as: ATIVAN Take 1 tablet (0.5 mg total) by mouth every 4 (four) hours.   metoprolol succinate 100 MG 24 hr tablet Commonly known as: TOPROL-XL Take 1 tablet (100 mg total) by mouth daily. What changed:   medication strength  how much to take  when to take this   Oyster Shell/Vitamin D 600-125 MG-UNIT Tabs Take 1 tablet by mouth daily. Notes to patient: Last dose was given today at 1:12 PM   pantoprazole 40 MG tablet Commonly known as: PROTONIX Take 1 tablet (40 mg total) by mouth daily. Notes to patient: Last dose was given today at 10:33 AM   potassium chloride 10 MEQ tablet Commonly known as: K-DUR Take 10 mEq by mouth daily. Notes to patient: Not given in hospital   Sodium  Chloride (Inhalant) 7 % Nebu Inhale 4 mLs into the lungs 3 (three) times daily. Notes to patient: Last dose was given today at 8:45 AM   sucralfate 1 g tablet Commonly known as:  CARAFATE Take 1 g by mouth 4 (four) times daily. Notes to patient: Last dose was given today at 10:33 AM   traMADol 50 MG tablet Commonly known as: ULTRAM Take 1-2 tablets (50-100 mg total) by mouth every 4 (four) hours as needed for moderate pain. Notes to patient: Last dose was given yesterday at 2:41 PM   vitamin C 1000 MG tablet Take 1,000 mg by mouth daily. Notes to patient: Last dose was given today at 10:38 AM         Management plans discussed with the patient and she is in agreement. Stable for discharge   Patient should follow up with pulmonary  CODE STATUS:     Code Status Orders  (From admission, onward)         Start     Ordered   09/27/18 1817  Do not attempt resuscitation (DNR)  Continuous    Question Answer Comment  In the event of cardiac or respiratory ARREST Do not call a "code blue"   In the event of cardiac or respiratory ARREST Do not perform Intubation, CPR, defibrillation or ACLS   In the event of cardiac or respiratory ARREST Use medication by any route, position, wound care, and other measures to relive pain and suffering. May use oxygen, suction and manual treatment of airway obstruction as needed for comfort.      09/27/18 1816        Code Status History    Date Active Date Inactive Code Status Order ID Comments User Context   04/19/2018 1147 04/19/2018 1549 Full Code [id number]  Hessie Knows, MD Inpatient   04/08/2018 1817 04/14/2018 1637 DNR [id number]  Tyler Pita, MD Inpatient   04/08/2018 0752 04/08/2018 1817 Full Code [id number]  Harrie Foreman, MD Inpatient   04/05/2018 1455 04/05/2018 1831 Full Code [id number]  Hessie Knows, MD Inpatient   01/05/2018 1836 01/06/2018 1504 Full Code [id number]  Hessie Knows, MD Inpatient   12/18/2017 1741 12/18/2017 2125 Full Code [id number]  Hessie Knows, MD Inpatient   11/29/2017 1555 11/29/2017 2013 Full Code [id number]  Hessie Knows, MD Inpatient   07/31/2017 1054 08/03/2017 1524 DNR [id number]  Ana Loll,  MD Inpatient   07/31/2017 0057 07/31/2017 1054 Full Code [id number]  Lance Coon, MD Inpatient   07/05/2016 0931 07/05/2016 1311 Full Code [id number]  Hessie Knows, MD Inpatient   Advance Care Planning Activity    Advance Directive Documentation     Most Recent Value  Type of Advance Directive  Healthcare Power of Attorney, Living will  Pre-existing out of facility DNR order (yellow form or [place] MOST form)  -  "MOST" Form in Place?  -      TOTAL TIME TAKING CARE OF THIS PATIENT: 38 minutes.    Note: This dictation was prepared with Dragon dictation along with smaller phrase technology. Any transcriptional errors that result from this process are unintentional.  Neita Carp M.D on 10/11/2018 at 1:12 PM  Between 7am to 6pm - Pager - [phone] After 6pm go to [email or web address] - password [email or web address] [facility] [facility]  Office  [phone]  CC: Primary care physician; Ana Pink, MD

## 2018-10-07 ENCOUNTER — Inpatient Hospital Stay: Payer: Medicare Other

## 2018-10-07 LAB — CBC
HCT: 25.2 % — ABNORMAL LOW (ref 36.0–46.0)
Hemoglobin: 8.1 g/dL — ABNORMAL LOW (ref 12.0–15.0)
MCH: 32.5 pg (ref 26.0–34.0)
MCHC: 32.1 g/dL (ref 30.0–36.0)
MCV: 101.2 fL — ABNORMAL HIGH (ref 80.0–100.0)
Platelets: 413 10*3/uL — ABNORMAL HIGH (ref 150–400)
RBC: 2.49 MIL/uL — ABNORMAL LOW (ref 3.87–5.11)
RDW: 13.6 % (ref 11.5–15.5)
WBC: 18.4 10*3/uL — ABNORMAL HIGH (ref 4.0–10.5)
nRBC: 0 % (ref 0.0–0.2)

## 2018-10-07 MED ORDER — SODIUM CHLORIDE 0.9 % IV BOLUS
500.0000 mL | Freq: Once | INTRAVENOUS | Status: AC
  Administered 2018-10-07: 500 mL via INTRAVENOUS

## 2018-10-07 MED ORDER — DIGOXIN 0.25 MG/ML IJ SOLN
0.2500 mg | Freq: Once | INTRAMUSCULAR | Status: AC
  Administered 2018-10-07: 21:00:00 0.25 mg via INTRAVENOUS
  Filled 2018-10-07: qty 2

## 2018-10-07 MED ORDER — METOPROLOL TARTRATE 5 MG/5ML IV SOLN
5.0000 mg | Freq: Once | INTRAVENOUS | Status: AC
  Administered 2018-10-07: 5 mg via INTRAVENOUS
  Filled 2018-10-07: qty 5

## 2018-10-07 MED ORDER — DILTIAZEM HCL 25 MG/5ML IV SOLN
10.0000 mg | INTRAVENOUS | Status: AC
  Administered 2018-10-07: 10 mg via INTRAVENOUS
  Filled 2018-10-07: qty 5

## 2018-10-07 MED ORDER — IPRATROPIUM-ALBUTEROL 20-100 MCG/ACT IN AERS
2.0000 | INHALATION_SPRAY | Freq: Three times a day (TID) | RESPIRATORY_TRACT | Status: DC
  Administered 2018-10-07 – 2018-10-11 (×10): 2 via RESPIRATORY_TRACT
  Filled 2018-10-07: qty 4

## 2018-10-07 MED ORDER — SODIUM CHLORIDE 0.9 % IV SOLN
1.0000 g | Freq: Two times a day (BID) | INTRAVENOUS | Status: DC
  Administered 2018-10-07: 23:00:00 1 g via INTRAVENOUS
  Filled 2018-10-07 (×3): qty 1

## 2018-10-07 MED ORDER — LORAZEPAM 0.5 MG PO TABS: 0.5000 mg | ORAL_TABLET | ORAL | 0 refills | Status: DC

## 2018-10-07 NOTE — Progress Notes (Signed)
notified by tele pts HR in th 150's-160's. MD Mody notified , orders received for 10 mg IV push Cardizem. Order placed.  ICU nurse Katie notified, at bedside to administer.

## 2018-10-07 NOTE — Progress Notes (Signed)
Physical Therapy Treatment Patient Details Name: Ana [place] MRN: [id number] DOB: [birth year] Today's Date: 10/07/2018    History of Present Illness Pt is a 73 year old female admitted s/p I.M. nailing of R hip following a fall in her home.  Pt tripped over her O2 tubing.  PMH includes T10 kyphoplasty on 04/08/2018, atrial fibrillation, arthritis and anxiety.    PT Comments    Pt less talkative today but still reporting high pain level.  Follows directions consistently but appears lethargic and still hesitant to move R LE.  Mod A to sit upright with HOB elevated and assisted pt in scooting to EOB.  Pt with poor sitting posture at EOB, stating that she was "resting".  Pt attempted 2x to stand with bed elevated and achieved upright posture on second attempt, during which she stated that she was urinating.  Nurse tech called in to assist with cleanup and PT ensured safety of pt during transfers and cleanup that required pt to stand.  Pt instructed in positioning, importance and management of HEP and there ex.  She demonstrates understanding of there ex but states that she "can't" move her R LE.  Increased time needed to demonstrate there ex on L side and to place R LE in varying positions so that pt was able to see ability to move and that pain is her most limiting factor.  Pt will continue to benefit from skilled PT with focus on strength, tolerance to activity, pain management and safe functional mobility.   Follow Up Recommendations  SNF     Equipment Recommendations       Recommendations for Other Services       Precautions / Restrictions Precautions Precautions: Fall Precaution Comments: airborne/contact iso Restrictions Weight Bearing Restrictions: Yes RLE Weight Bearing: Weight bearing as tolerated    Mobility  Bed Mobility Overal bed mobility: Needs Assistance Bed Mobility: Supine to Sit     Supine to sit: Mod assist     General bed mobility comments: Assistance for  initiation of R LE movement and to scoot to EOB.  Pt returns to a slumped postition when not cued to sit upright intermittiently.  Transfers Overall transfer level: Needs assistance Equipment used: Rolling walker (2 wheeled) Transfers: Sit to/from Stand Sit to Stand: Mod assist Stand pivot transfers: Max assist       General transfer comment: Attempted STS and was able to stand with mod A on second attempt.  Was achieving upright posture and began to urinate on floor and socks.  Pt seated and PT tech called in for cleaning and assistance with positioning.  PT performed Max A pivot transfer to get pt into chair after this.  Ambulation/Gait             General Gait Details: unable to tolerate this session   Stairs             Wheelchair Mobility    Modified Rankin (Stroke Patients Only)       Balance Overall balance assessment: Needs assistance Sitting-balance support: No upper extremity supported;Feet supported Sitting balance-Leahy Scale: Poor Sitting balance - Comments: Pt returns to flexed posture at L side when sitting, requires VC's to sit upright.   Standing balance support: Bilateral upper extremity supported Standing balance-Leahy Scale: Poor Standing balance comment: Able to briefly achieve upright posture with close guard, hesitant to WB on R side.  Cognition Arousal/Alertness: Lethargic;Suspect due to medications Behavior During Therapy: [facility] for tasks assessed/performed Overall Cognitive Status: Within Functional Limits for tasks assessed                                 General Comments: follow commands, but requires extensive reorientation to task at hand/goals of session; extremely focused on multiple needs, requests (unrelated to therapy) throughout session      Exercises General Exercises - Lower Extremity Ankle Circles/Pumps: 20 reps;Both;AROM;Supine Quad Sets: 10  reps;Supine;Strengthening;Right Short Arc QuadSinclair Ship;Right;Strengthening;Left;10 reps;Seated Long Arc Quad: Right;5 reps;Seated(Attempted at EOB with very small ROM achieved.) Heel Slides: AROM;Both;10 reps;Supine Hip ABduction/ADduction: AAROM;Right;10 reps;Supine(Within pt's tolerance.) Other Exercises Other Exercises: Time to assist with positioning and safety for cleaning pt after voiding accident, doffing and donning socks and positioning for comfort. x10 min Other Exercises: Education concerning management of HEP and pain management. x7 min    General Comments        Pertinent Vitals/Pain Faces Pain Scale: Hurts even more Pain Location: R hip and LE in general Pain Descriptors / Indicators: Grimacing;Guarding Pain Intervention(s): Limited activity within patient's tolerance;Monitored during session    Home Living                      Prior Function            PT Goals (current goals can now be found in the care plan section) Acute Rehab PT Goals Patient Stated Goal: To get back to being able to walk on her own. PT Goal Formulation: With patient Time For Goal Achievement: 10/15/18 Potential to Achieve Goals: Fair Progress towards PT goals: Progressing toward goals    Frequency    7X/week      PT Plan Current plan remains appropriate    Co-evaluation              AM-PAC PT "6 Clicks" Mobility   Outcome Measure  Help needed turning from your back to your side while in a flat bed without using bedrails?: A Lot Help needed moving from lying on your back to sitting on the side of a flat bed without using bedrails?: A Lot Help needed moving to and from a bed to a chair (including a wheelchair)?: A Lot Help needed standing up from a chair using your arms (e.g., wheelchair or bedside chair)?: A Lot Help needed to walk in hospital room?: Total Help needed climbing 3-5 steps with a railing? : Total 6 Click Score: 10    End of Session Equipment  Utilized During Treatment: Gait belt;Oxygen Activity Tolerance: Patient limited by pain;Patient limited by fatigue;Patient limited by lethargy Patient left: in chair;with call bell/phone within reach;with chair alarm set Nurse Communication: Mobility status PT Visit Diagnosis: Unsteadiness on feet (R26.81);Muscle weakness (generalized) (M62.81);History of falling (Z91.81);Pain Pain - Right/Left: Right Pain - part of body: Knee     Time: [id number] PT Time Calculation (min) (ACUTE ONLY): 46 min  Charges:  $Therapeutic Exercise: 8-22 mins $Therapeutic Activity: 23-37 mins                    Roxanne Gates, PT, DPT    Roxanne Gates 10/07/2018, 10:53 AM

## 2018-10-07 NOTE — Progress Notes (Signed)
Pharmacy Antibiotic Note  Ana Washington is a 73 y.o. female admitted on 09/27/2018 with UTI.  Pharmacy has been consulted for Meropenem dosing.  Plan: Will order Meropenem 1 gm IV Q12H ordered to start on 9/6 @ 2200.   Height: 5\' 5"  (165.1 cm) Weight: 105 lb (47.6 kg) IBW/kg (Calculated) : 57  Temp (24hrs), Avg:99.4 F (37.4 C), Min:98.5 F (36.9 C), Max:100.5 F (38.1 C)  Recent Labs  Lab 10/01/18 0318 10/02/18 0603 10/03/18 0411 10/04/18 0552 10/05/18 0911 10/06/18 0346 10/07/18 0356  WBC 11.5* 18.2* 12.0* 11.1* 23.7* 16.5* 18.4*  CREATININE 0.58 0.50 0.51  --  0.55 0.47  --     Estimated Creatinine Clearance: 46.4 mL/min (by C-G formula based on SCr of 0.47 mg/dL).    Allergies  Allergen Reactions  . Sulfa Antibiotics Diarrhea  . Codeine Nausea And Vomiting  . Penicillins Rash    Has patient had a PCN reaction causing immediate rash, facial/tongue/throat swelling, SOB or lightheadedness with hypotension: Unknown Has patient had a PCN reaction causing severe rash involving mucus membranes or skin necrosis: Unknown Has patient had a PCN reaction that required hospitalization: Unknown Has patient had a PCN reaction occurring within the last 10 years: No If all of the above answers are "NO", then may proceed with Cephalosporin use.     Antimicrobials this admission:   >>    >>   Dose adjustments this admission:   Microbiology results:  BCx:   UCx:    Sputum:    MRSA PCR:   Thank you for allowing pharmacy to be a part of this patient's care.  Rue Tinnel D 10/07/2018 9:02 PM

## 2018-10-07 NOTE — Progress Notes (Addendum)
Notified by tele pt in Afib, MD Mody notified via text page and paged to floor.

## 2018-10-07 NOTE — Progress Notes (Signed)
[facility] at [place] NAME: Ana Washington    MR#:  [id number]  DATE OF BIRTH:  07/08/1945  SUBJECTIVE:  Sleepy this ma Waiting for insurance to approve rehab   REVIEW OF SYSTEMS:    Review of Systems  Constitutional: Negative for fever, chills weight loss HENT: Negative for ear pain, nosebleeds, congestion, facial swelling, rhinorrhea, neck pain, neck stiffness and ear discharge.   Respiratory: Negative for cough, shortness of breath, wheezing  Cardiovascular: Negative for chest pain, palpitations and leg swelling.  Gastrointestinal: Negative for heartburn, abdominal pain, vomiting, diarrhea or consitpation Genitourinary: Negative for dysuria, urgency, frequency, hematuria Musculoskeletal: Negative for back pain denies hip pain Neurological: Negative for dizziness, seizures, syncope, focal weakness,  numbness and headaches.  Hematological: Does not bruise/bleed easily.  Psychiatric/Behavioral: Negative for hallucinations, confusion, dysphoric mood    Tolerating Diet: yes     DRUG ALLERGIES:   Allergies  Allergen Reactions  . Sulfa Antibiotics Diarrhea  . Codeine Nausea And Vomiting  . Penicillins Rash    Has patient had a PCN reaction causing immediate rash, facial/tongue/throat swelling, SOB or lightheadedness with hypotension: Unknown Has patient had a PCN reaction causing severe rash involving mucus membranes or skin necrosis: Unknown Has patient had a PCN reaction that required hospitalization: Unknown Has patient had a PCN reaction occurring within the last 10 years: No If all of the above answers are "NO", then may proceed with Cephalosporin use.     VITALS:  Blood pressure 108/72, pulse 84, temperature 98.5 F (36.9 C), temperature source Oral, resp. rate 18, height _0  (1.651 m), weight 47.6 kg, SpO2 100 %.  PHYSICAL EXAMINATION:  Constitutional: Appears well-developed and well-nourished. No distress. HENT:  Normocephalic. [name] Oropharynx is clear and moist.  Eyes: Conjunctivae and EOM are normal. PERRLA, no scleral icterus.  Neck: Normal ROM. Neck supple. No JVD. No tracheal deviation. CVS: RRR, S1/S2 +, no murmurs, no gallops, no carotid bruit.  Pulmonary: b/l rhonchii Abdominal: Soft. BS +,  no distension, tenderness, rebound or guarding.  Musculoskeletal: can move her legs Neuro: Alert. CN 2-12 grossly intact. No focal deficits. Skin: Skin is warm and dry. No rash noted. Psychiatric: Normal mood and affect.      LABORATORY PANEL:   CBC Recent Labs  Lab 10/07/18 0356  WBC 18.4*  HGB 8.1*  HCT 25.2*  PLT 413*   ------------------------------------------------------------------------------------------------------------------  Chemistries  Recent Labs  Lab 10/02/18 0603  10/06/18 0346  NA 137   < > 134*  K 5.4*   < > 4.0  CL 96*   < > 88*  CO2 35*   < > 37*  GLUCOSE 137*   < > 83  BUN 17   < > 28*  CREATININE 0.50   < > 0.47  CALCIUM 8.7*   < > 8.8*  MG 2.2  --   --    < > = values in this interval not displayed.   ------------------------------------------------------------------------------------------------------------------  Cardiac Enzymes No results for input(s): TROPONINI in the last 168 hours. ------------------------------------------------------------------------------------------------------------------  RADIOLOGY:  Dg Knee Right Port  Result Date: 10/05/2018 CLINICAL DATA:  Right knee pain. No reported injury. EXAM: PORTABLE RIGHT KNEE - 1-2 VIEW COMPARISON:  None. FINDINGS: Medial and lateral meniscal calcifications. Minimal posterior patellar spur formation. Minimal effusion. The bones appear osteopenic. Arterial calcifications. IMPRESSION: 1. Minimal degenerative changes and minimal effusion. 2. Chondrocalcinosis. Electronically Signed   By: Claudie Revering M.D.   On: 10/05/2018 18:46  ASSESSMENT AND PLAN:    73 year old female with chronic hypoxic  respiratory failure on 2 L of oxygen and CAD who presented to the emergency room due to mechanical fall and right sided hip pain.  1.  Nondisplaced right intertrochanteric fracture:  7 Day Post-Op Procedure(s) (LRB): INTRAMEDULLARY (IM) NAIL INTERTROCHANTRIC  DVT prophylaxis with Eliquis TED hose BLE x 6 weeks, remove at night time  Weightbearing as tolerated to right leg She is s/p 1 unit PRBC due to acute postop blood loss with hemoglobin 8.1 this am and stable Remove staples and apply Steri-Strips on October 14, 2018 Follow-up with [facility] orthopedics 2 to 4 weeks.  2.  Acute on chronic hypoxic respiratory failure with history of bronchiectasis/MAC and left lower lobe resection and acute exacerbation of severe bilateral bronchiectasis:  She will need to continue azithromycin and ciprofloxacin for prophylaxis due to history of chronic Mycobacterium AVM infection.     3.  CAD: She should continue metoprolol  4.  Essential hypertension: her blood pressure is acceptable. She will continue diltiazem and metoprolol     5.  PAF: Patient currently in normal sinus rhythm Continue Eliquis. Continue diltiazem and metoprolol for heart rate control.  6.  ESBL UTI: She has completed course of meropenem for 5 days.  7. Elevated WBC without Fever: This was reactive. CBC has improved   management plans discussed with the patient and she is in agreement.  CODE STATUS: dnr  TOTAL TIME TAKING CARE OF THIS PATIENT: 22 minutes.   Patient ok to go to SNF today Waiting to hear from insurance.   POSSIBLE D/C when insurance approval    Bettey Costa M.D on 10/07/2018 at 10:20 AM  Between 7am to 6pm - Pager - [phone] After 6pm go to [email or web address] - password [email or web address] [facility]  Office  [phone]  CC: Primary care physician; [name], MD  Note: This dictation was prepared with Dragon dictation along with smaller phrase technology. Any transcriptional errors  that result from this process are unintentional.

## 2018-10-07 NOTE — Progress Notes (Addendum)
Pt was admitted on the floor from 1A for Afib RVR HR  Between 120-158. BP at 96/62. Talked to ANA 1A and states pt was given IV cardizem10 mg  and IV metropolol 5mg  once but HR stays stays at 120-160. Page prime awaiting callback. Will continue to monitor.  Update 2050: Talked to Dr. Jannifer Franklin and order STAT chest x-ray, 0.25 mg digoxin IV stat, repeat urine culture, discontinue cipro oral and start pt on meropenem. Dr. Jannifer Franklin states that if pt HR not fix by digoxin to call him. Will continue to monitor.   Update 0545: Pt IV infiltrated. IV was intact upon removal. IV team notified. Pt arm was elevated by pillow and ice was applied on the area. Will notify incoming shift. Will continue to monitor.

## 2018-10-07 NOTE — Progress Notes (Signed)
Report called to Roma Schanz, RN on 2A

## 2018-10-07 NOTE — Progress Notes (Signed)
Pts BP 105/65, MD Mody notified, Per MD hold morning dose of Cardizem 120 mg.

## 2018-10-07 NOTE — Plan of Care (Signed)
  Problem: Education: Goal: Knowledge of General Education information will improve Description: Including pain rating scale, medication(s)/side effects and non-pharmacologic comfort measures Outcome: Progressing   Problem: Clinical Measurements: Goal: Will remain free from infection Outcome: Progressing Goal: Diagnostic test results will improve Outcome: Progressing   Problem: Safety: Goal: Ability to remain free from injury will improve Outcome: Progressing   

## 2018-10-07 NOTE — Progress Notes (Signed)
Pt transferred to 2A, Leisure centre manager at bedside.

## 2018-10-07 NOTE — Progress Notes (Signed)
Pts HR remains elevated, Prime doc paged. MD Oije placing orders

## 2018-10-07 NOTE — Progress Notes (Signed)
Chest vest deferred. Patient with HR in 130s-150s. Now transferred to telemetry unit.

## 2018-10-07 NOTE — Progress Notes (Signed)
BP 105/65, MD Mody notified, orders to hold morning BP meds

## 2018-10-08 MED ORDER — SODIUM CHLORIDE 3 % IN NEBU
3.0000 mL | INHALATION_SOLUTION | Freq: Every day | RESPIRATORY_TRACT | Status: AC
  Administered 2018-10-09 – 2018-10-11 (×3): 3 mL via RESPIRATORY_TRACT
  Filled 2018-10-08 (×3): qty 4

## 2018-10-08 NOTE — Progress Notes (Signed)
   Subjective: 8 Days Post-Op Procedure(s) (LRB): INTRAMEDULLARY (IM) NAIL INTERTROCHANTRIC (Right) Patient reports pain as mild.   Patient is well, and has had no acute complaints or problems Making slow progress with physical therapy Plan is to go Rehab after hospital stay. no nausea and no vomiting Patient denies any chest pains or shortness of breath. Appears to be resting well. Voicing no complaints  Objective: Vital signs in last 24 hours: Temp:  [98.1 F (36.7 C)-100.5 F (38.1 C)] 98.1 F (36.7 C) (09/07 0812) Pulse Rate:  [68-145] 71 (09/07 0812) Resp:  [16-18] 18 (09/07 0812) BP: (94-131)/(60-79) 118/60 (09/07 0812) SpO2:  [100 %] 100 % (09/07 0812) well approximated incision Heels are non tender and elevated off the bed using rolled towels Intake/Output from previous day: 09/06 0701 - 09/07 0700 In: 604.6 [IV Piggyback:604.6] Out: -  Intake/Output this shift: No intake/output data recorded.  Recent Labs    10/05/18 0911 10/06/18 0346 10/07/18 0356  HGB 9.2* 8.2* 8.1*   Recent Labs    10/06/18 0346 10/06/18 1041 10/07/18 0356  WBC 16.5*  --  18.4*  RBC 2.46* 2.58* 2.49*  HCT 24.6*  --  25.2*  PLT 361  --  413*   Recent Labs    10/05/18 0911 10/06/18 0346  NA 137 134*  K 4.8 4.0  CL 89* 88*  CO2 37* 37*  BUN 29* 28*  CREATININE 0.55 0.47  GLUCOSE 83 83  CALCIUM 9.3 8.8*   No results for input(s): LABPT, INR in the last 72 hours.  EXAM General - Patient is Alert, Appropriate and Oriented Extremity - Neurologically intact Neurovascular intact Sensation intact distally Intact pulses distally Dorsiflexion/Plantar flexion intact No cellulitis present Compartment soft Dressing - scant drainage Motor Function - intact, moving foot and toes well on exam.    Past Medical History:  Diagnosis Date  . Arthritis   . Atrial fibrillation ([place])   . Bronchiectasis ([place])   . CAD (coronary artery disease)   . CAD (coronary artery disease)  07/30/2017  . Dumping syndrome   . Dyspnea   . Dysrhythmia    afib  . 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 Family history of adverse reaction to anesthesia    sister [name]  . GERD (gastroesophageal reflux disease)   . Headache    MIGRAINES  . Hypertension   . Lung disease   . Myocardial infarction ([place]) 2007   Non-STEMI  . [name] (postoperative nausea and vomiting)   . Psoriasis   . PUD (peptic ulcer disease)     Assessment/Plan: 8 Days Post-Op Procedure(s) (LRB): INTRAMEDULLARY (IM) NAIL INTERTROCHANTRIC (Right) Active Problems:   Closed right hip fracture (HCC)  Estimated body mass index is 17.47 kg/m as calculated from the following:   Height as of this encounter: 5\' 5"  (1.651 m).   Weight as of this encounter: 47.6 kg. Up with therapy Discharge to SNF when authorization has been approved  Labs: None DVT Prophylaxis - Lovenox, Foot Pumps and TED hose Weight-Bearing as tolerated to right leg Will need to follow-up in clinical clinic 2 weeks postop for staple removal We will need to continue Lovenox for 2 weeks postop Continue TED stockings as ordered  Jon R. [place] 10/08/2018, 9:07 AM

## 2018-10-08 NOTE — Progress Notes (Signed)
[facility] at [place] NAME: Ana Washington    MR#:  [id number]  DATE OF BIRTH:  27-Apr-1945  SUBJECTIVE:  Patient tx tele due to elevated HR but HR better this am Sleepy this am Waiting to go to rehab  REVIEW OF SYSTEMS:    Review of Systems  Constitutional: Negative for fever, chills weight loss HENT: Negative for ear pain, nosebleeds, congestion, facial swelling, rhinorrhea, neck pain, neck stiffness and ear discharge.   Respiratory: Negative for cough, shortness of breath, wheezing  Cardiovascular: Negative for chest pain, palpitations and leg swelling.  Gastrointestinal: Negative for heartburn, abdominal pain, vomiting, diarrhea or consitpation Genitourinary: Negative for dysuria, urgency, frequency, hematuria Musculoskeletal: Negative for back pain denies hip pain Neurological: Negative for dizziness, seizures, syncope, focal weakness,  numbness and headaches.  Hematological: Does not bruise/bleed easily.  Psychiatric/Behavioral: Negative for hallucinations, confusion, dysphoric mood    Tolerating Diet: yes     DRUG ALLERGIES:   Allergies  Allergen Reactions  . Sulfa Antibiotics Diarrhea  . Codeine Nausea And Vomiting  . Penicillins Rash    Has patient had a PCN reaction causing immediate rash, facial/tongue/throat swelling, SOB or lightheadedness with hypotension: Unknown Has patient had a PCN reaction causing severe rash involving mucus membranes or skin necrosis: Unknown Has patient had a PCN reaction that required hospitalization: Unknown Has patient had a PCN reaction occurring within the last 10 years: No If all of the above answers are "NO", then may proceed with Cephalosporin use.     VITALS:  Blood pressure 118/60, pulse 71, temperature 98.1 F (36.7 C), temperature source Oral, resp. rate 18, height _0  (1.651 m), weight 47.6 kg, SpO2 100 %.  PHYSICAL EXAMINATION:  Constitutional: Appears well-developed and  well-nourished. No distress. HENT: Normocephalic. [name] Oropharynx is clear and moist.  Eyes: Conjunctivae and EOM are normal. PERRLA, no scleral icterus.  Neck: Normal ROM. Neck supple. No JVD. No tracheal deviation. CVS: RRR, S1/S2 +, no murmurs, no gallops, no carotid bruit.  Pulmonary: b/l rhonchii Abdominal: Soft. BS +,  no distension, tenderness, rebound or guarding.  Musculoskeletal: can move her legs Neuro: Alert. CN 2-12 grossly intact. No focal deficits. Skin: Skin is warm and dry. No rash noted. Psychiatric: Normal mood and affect.      LABORATORY PANEL:   CBC Recent Labs  Lab 10/07/18 0356  WBC 18.4*  HGB 8.1*  HCT 25.2*  PLT 413*   ------------------------------------------------------------------------------------------------------------------  Chemistries  Recent Labs  Lab 10/02/18 0603  10/06/18 0346  NA 137   < > 134*  K 5.4*   < > 4.0  CL 96*   < > 88*  CO2 35*   < > 37*  GLUCOSE 137*   < > 83  BUN 17   < > 28*  CREATININE 0.50   < > 0.47  CALCIUM 8.7*   < > 8.8*  MG 2.2  --   --    < > = values in this interval not displayed.   ------------------------------------------------------------------------------------------------------------------  Cardiac Enzymes No results for input(s): TROPONINI in the last 168 hours. ------------------------------------------------------------------------------------------------------------------  RADIOLOGY:  Dg Chest Port 1 View  Result Date: 10/07/2018 CLINICAL DATA:  Cough EXAM: PORTABLE CHEST 1 VIEW COMPARISON:  10/05/2018 FINDINGS: Postoperative changes bilaterally. Large hiatal hernia again noted, unchanged. Patchy bilateral airspace opacities again noted, stable. Possible small effusions. No acute bony abnormality. IMPRESSION: Stable patchy bilateral airspace disease and small effusions. Electronically Signed   By: Lennette Bihari  [place] M.D.   On: 10/07/2018 21:09     ASSESSMENT AND PLAN:    73 year old female with  chronic hypoxic respiratory failure on 2 L of oxygen and CAD who presented to the emergency room due to mechanical fall and right sided hip pain.  1.  Nondisplaced right intertrochanteric fracture:  8 Day Post-Op Procedure(s) (LRB): INTRAMEDULLARY (IM) NAIL INTERTROCHANTRIC  DVT prophylaxis with Eliquis TED hose BLE x 6 weeks, remove at night time  Weightbearing as tolerated to right leg She is s/p 1 unit PRBC due to acute postop blood loss with hemoglobin stable. Anemia panel consistent with chronic disease.  Remove staples and apply Steri-Strips on October 14, 2018 Follow-up with [facility] orthopedics 2 to 4 weeks.  2.  Acute on chronic hypoxic respiratory failure with history of bronchiectasis/MAC and left lower lobe resection and acute exacerbation of severe bilateral bronchiectasis:  She will need to continue azithromycin and ciprofloxacin for prophylaxis due to history of chronic Mycobacterium AVM infection.     3.  CAD: She should continue metoprolol  4.  Essential hypertension: her blood pressure is acceptable. She will continue diltiazem and metoprolol     5.  PAF: Patient currently in normal sinus rhythm and her heart rate is better controlled this am. Continue Eliquis. Continue diltiazem and metoprolol for heart rate control.  6.  ESBL UTI: She has completed course of meropenem for 5 days.  7. Elevated WBC without Fever: This was reactive. CBC has improved   management plans discussed with the patient and she is in agreement.  CODE STATUS: dnr  TOTAL TIME TAKING CARE OF THIS PATIENT: 22 minutes.   Patient ok to go to SNF today Waiting to hear from insurance.   POSSIBLE D/C when insurance approval    Bettey Costa M.D on 10/08/2018 at 10:18 AM  Between 7am to 6pm - Pager - [phone] After 6pm go to [email or web address] - password [email or web address] [facility]  Office  [phone]  CC: Primary care physician; [name], MD  Note: This dictation  was prepared with Dragon dictation along with smaller phrase technology. Any transcriptional errors that result from this process are unintentional.

## 2018-10-08 NOTE — Progress Notes (Signed)
Physical Therapy Treatment Patient Details Name: Ana Washington MRN: [id number] DOB: 1945-11-16 Today's Date: 10/08/2018    History of Present Illness Pt is a 73 year old female admitted s/p I.M. nailing of R hip following a fall in her home.  Pt tripped over her O2 tubing.  PMH includes T10 kyphoplasty on 04/08/2018, atrial fibrillation, arthritis and anxiety.    PT Comments    Patient resting with eyes closed upon arrival to session; awakens to voice, but intermittently closes eyes back requiring consistent cuing/encouragement for alertness and participation with task.  Voices eagerness to participate with R LE therex, stating "I want to get this leg working again so badly"; however, continues to demonstrate limited ability/willingness to actively initiate hip/knee musculature; quick to terminate effort with any onset of pain. Was able to complete sit/stand and SPT (bed/chair) with RW, mod assist +1; relative improvement in ease of movement transition this date (compared to previous attempts).  Fatigues very quickly; unable to tolerate gait efforts after transfer to chair due to fatigue/SOB. Would like to make gait progression primary goal of next session; will ask nursing to assist patient with OOB to chair prior to session so that therapy session may be more dedicated to mobility progression as patient able to tolerate. Of note, HR 70-80s throughout session, appropriate chronotropic response to activity; sats >97% on 2L [place].  Follow Up Recommendations  SNF     Equipment Recommendations       Recommendations for Other Services       Precautions / Restrictions Precautions Precautions: Fall Restrictions Weight Bearing Restrictions: Yes RLE Weight Bearing: Weight bearing as tolerated    Mobility  Bed Mobility Overal bed mobility: Needs Assistance Bed Mobility: Supine to Sit     Supine to sit: Mod assist     General bed mobility comments: assist for R LE movement, truncal  elevation and scooting towards edge of bed; prefers R LE semi-extended once upright for pain control  Transfers Overall transfer level: Needs assistance Equipment used: Rolling walker (2 wheeled) Transfers: Sit to/from Stand Sit to Stand: Mod assist Stand pivot transfers: Mod assist       General transfer comment: assist for lift off, anterior weight translation and standing balance; prefers bilat UEs pushing from bed surface.  Shuffling/sliding steps towards chair; unable to truly unweight and step with L LE due to pain in R LE with loading  Ambulation/Gait             General Gait Details: unable to tolerate this session   Stairs             Wheelchair Mobility    Modified Rankin (Stroke Patients Only)       Balance Overall balance assessment: Needs assistance Sitting-balance support: No upper extremity supported;Feet supported Sitting balance-Leahy Scale: Fair       Standing balance-Leahy Scale: Poor                              Cognition Arousal/Alertness: Awake/alert Behavior During Therapy: WFL for tasks assessed/performed Overall Cognitive Status: Within Functional Limits for tasks assessed                                 General Comments: limited recall of therapy involvement and intervention on previous days      Exercises Other Exercises Other Exercises: Supine R LE therex, 1x12, act  assist ROM: ankle pumps, SAQs, heel slides, hip abduct/adduct.  Limited ability/willingness to actively initiate hip/knee musculature; quick to terminate effort with any onset of pain.    General Comments        Pertinent Vitals/Pain Pain Assessment: Faces Faces Pain Scale: Hurts even more Pain Location: R hip and knee Pain Descriptors / Indicators: Aching;Grimacing;Guarding Pain Intervention(s): Limited activity within patient's tolerance;Monitored during session;Repositioned;Patient requesting pain meds-RN notified    Home  Living                      Prior Function            PT Goals (current goals can now be found in the care plan section) Acute Rehab PT Goals Patient Stated Goal: To get back to being able to walk on her own. PT Goal Formulation: With patient Time For Goal Achievement: 10/15/18 Potential to Achieve Goals: Fair Progress towards PT goals: Progressing toward goals    Frequency    7X/week      PT Plan Current plan remains appropriate    Co-evaluation              AM-PAC PT "6 Clicks" Mobility   Outcome Measure  Help needed turning from your back to your side while in a flat bed without using bedrails?: A Lot Help needed moving from lying on your back to sitting on the side of a flat bed without using bedrails?: A Lot Help needed moving to and from a bed to a chair (including a wheelchair)?: A Lot Help needed standing up from a chair using your arms (e.g., wheelchair or bedside chair)?: A Lot Help needed to walk in hospital room?: Total Help needed climbing 3-5 steps with a railing? : Total 6 Click Score: 10    End of Session Equipment Utilized During Treatment: Gait belt;Oxygen Activity Tolerance: Patient tolerated treatment well Patient left: in chair;with call bell/phone within reach(chair pad in place, alarm box not available; RN informed/aware and to locate for room) Nurse Communication: Mobility status PT Visit Diagnosis: Unsteadiness on feet (R26.81);Muscle weakness (generalized) (M62.81);History of falling (Z91.81);Pain Pain - Right/Left: Right Pain - part of body: Knee     Time: 1750-1830 PT Time Calculation (min) (ACUTE ONLY): 40 min  Charges:  $Gait Training: 8-22 mins $Therapeutic Exercise: 8-22 mins $Therapeutic Activity: 8-22 mins                     Jayleene Glaeser H. Owens Shark, PT, DPT, NCS 10/08/18, 9:47 PM [phone]

## 2018-10-08 NOTE — Care Management Important Message (Addendum)
Important Message  Patient Details  Name: AJSA GODIN MRN: [id number] Date of Birth: 14-Jul-1945   Medicare Important Message Given:  Yes  Patient on airborne precaution room, CSW gave IM to bedside nurse who will give to patient next time she goes in.    Ross Ludwig, LCSW 10/08/2018, 1:51 PM

## 2018-10-08 NOTE — TOC Transition Note (Signed)
Transition of Care [facility]) - CM/SW Discharge Note   Patient Details  Name: Ana Washington MRN: [id number] Date of Birth: 12/21/1945  Transition of Care [facility]) CM/SW Contact:  Ross Ludwig, LCSW Phone Number: 10/08/2018, 1:49 PM   Clinical Narrative:     SNF still has not received insurance authorization, CSW continuing to follow patient's progress throughout discharge planning.   Barriers to Discharge: Continued Medical Work up   Patient Goals and CMS Choice Patient states their goals for this hospitalization and ongoing recovery are:: To go home.      Discharge Placement                       Discharge Plan and Services In-house Referral: Clinical Social Work Discharge Planning Services: CM Consult                                 Social Determinants of Health (SDOH) Interventions     Readmission Risk Interventions Readmission Risk Prevention Plan 01/06/2018  Transportation Screening Complete  PCP or Specialist Appt within 5-7 Days Complete  Home Care Screening Complete  Medication Review (RN CM) Complete  Some recent data might be hidden

## 2018-10-09 ENCOUNTER — Encounter: Payer: Self-pay | Admitting: *Deleted

## 2018-10-09 LAB — BASIC METABOLIC PANEL
Anion gap: 7 (ref 5–15)
BUN: 13 mg/dL (ref 8–23)
CO2: 39 mmol/L — ABNORMAL HIGH (ref 22–32)
Calcium: 8.6 mg/dL — ABNORMAL LOW (ref 8.9–10.3)
Chloride: 91 mmol/L — ABNORMAL LOW (ref 98–111)
Creatinine, Ser: 0.45 mg/dL (ref 0.44–1.00)
GFR calc Af Amer: >60 mL/min (ref >60)
GFR calc non Af Amer: >60 mL/min (ref >60)
Glucose, Bld: 89 mg/dL (ref 70–99)
Potassium: 3.7 mmol/L (ref 3.5–5.1)
Sodium: 137 mmol/L (ref 135–145)

## 2018-10-09 LAB — HEMOGLOBIN AND HEMATOCRIT, BLOOD
HCT: 26.6 % — ABNORMAL LOW (ref 36.0–46.0)
Hemoglobin: 9 g/dL — ABNORMAL LOW (ref 12.0–15.0)

## 2018-10-09 LAB — CBC
HCT: 22.9 % — ABNORMAL LOW (ref 36.0–46.0)
Hemoglobin: 7.3 g/dL — ABNORMAL LOW (ref 12.0–15.0)
MCH: 32.3 pg (ref 26.0–34.0)
MCHC: 31.9 g/dL (ref 30.0–36.0)
MCV: 101.3 fL — ABNORMAL HIGH (ref 80.0–100.0)
Platelets: 434 10*3/uL — ABNORMAL HIGH (ref 150–400)
RBC: 2.26 MIL/uL — ABNORMAL LOW (ref 3.87–5.11)
RDW: 13.5 % (ref 11.5–15.5)
WBC: 11.3 10*3/uL — ABNORMAL HIGH (ref 4.0–10.5)
nRBC: 0 % (ref 0.0–0.2)

## 2018-10-09 LAB — URINE CULTURE: Culture: <10000 — AB

## 2018-10-09 LAB — PREPARE RBC (CROSSMATCH)

## 2018-10-09 MED ORDER — SODIUM CHLORIDE 0.9% IV SOLUTION
Freq: Once | INTRAVENOUS | Status: AC
  Administered 2018-10-09: 08:00:00 via INTRAVENOUS

## 2018-10-09 MED ORDER — INFLUENZA VAC A&B SA ADJ QUAD 0.5 ML IM PRSY
0.5000 mL | PREFILLED_SYRINGE | INTRAMUSCULAR | Status: AC
  Administered 2018-10-10: 0.5 mL via INTRAMUSCULAR
  Filled 2018-10-09 (×3): qty 0.5

## 2018-10-09 NOTE — Progress Notes (Signed)
OT Cancellation Note  Patient Details Name: GALA MERRIHEW MRN: [id number] DOB: 11-May-1945   Cancelled Treatment:    Reason Eval/Treat Not Completed: Medical issues which prohibited therapy. Pt receiving blood transfusion. Will re-attempt at later date/time as pt is available and medically appropriate.   Jeni Salles, MPH, MS, OTR/L ascom [phone] 10/09/18, 3:01 PM

## 2018-10-09 NOTE — Progress Notes (Signed)
Blood transfusion has completed; VSS. RR is 24 and pt has coughing, non-productive. Denies SOB; states cough is not new. Lung exam reveals bibasilar crackles. Left SL flushed and patent. PO meds were given without difficulty. Pt had large BM and voided approx 300 in bedpan. Temp 100.2. Will CTM.

## 2018-10-09 NOTE — Progress Notes (Signed)
[facility] NAME: Reagen Goates    MR#:  [id number]  DATE OF BIRTH:  09-03-1945  SUBJECTIVE:  Denies melena or abdominal pain waiting for rehab     REVIEW OF SYSTEMS:    Review of Systems  Constitutional: Negative for fever, chills weight loss HENT: Negative for ear pain, nosebleeds, congestion, facial swelling, rhinorrhea, neck pain, neck stiffness and ear discharge.   Respiratory: Negative for cough, shortness of breath, wheezing  Cardiovascular: Negative for chest pain, palpitations and leg swelling.  Gastrointestinal: Negative for heartburn, abdominal pain, vomiting, diarrhea or consitpation Genitourinary: Negative for dysuria, urgency, frequency, hematuria Musculoskeletal: Negative for back pain denies hip pain Neurological: Negative for dizziness, seizures, syncope, focal weakness,  numbness and headaches.  Hematological: Does not bruise/bleed easily.  Psychiatric/Behavioral: Negative for hallucinations, confusion, dysphoric mood    Tolerating Diet: yes     DRUG ALLERGIES:   Allergies  Allergen Reactions  . Sulfa Antibiotics Diarrhea  . Codeine Nausea And Vomiting  . Penicillins Rash    Has patient had a PCN reaction causing immediate rash, facial/tongue/throat swelling, SOB or lightheadedness with hypotension: Unknown Has patient had a PCN reaction causing severe rash involving mucus membranes or skin necrosis: Unknown Has patient had a PCN reaction that required hospitalization: Unknown Has patient had a PCN reaction occurring within the last 10 years: No If all of the above answers are "NO", then may proceed with Cephalosporin use.     VITALS:  Blood pressure 119/83, pulse 79, temperature 98.1 F (36.7 C), temperature source Oral, resp. rate 16, height _0  (1.651 m), weight 47.6 kg, SpO2 100 %.  PHYSICAL EXAMINATION:  Constitutional: Appears well-developed and well-nourished. No distress. HENT:  Normocephalic. [name] Oropharynx is clear and moist.  Eyes: Conjunctivae and EOM are normal. PERRLA, no scleral icterus.  Neck: Normal ROM. Neck supple. No JVD. No tracheal deviation. CVS: RRR, S1/S2 +, no murmurs, no gallops, no carotid bruit.  Pulmonary: b/l rhonchii Abdominal: Soft. BS +,  no distension, tenderness, rebound or guarding.  Musculoskeletal: can move her legs Neuro: Alert. CN 2-12 grossly intact. No focal deficits. Skin: Skin is warm and dry. No rash noted. Psychiatric: Normal mood and affect.      LABORATORY PANEL:   CBC Recent Labs  Lab 10/09/18 0655  WBC 11.3*  HGB 7.3*  HCT 22.9*  PLT 434*   ------------------------------------------------------------------------------------------------------------------  Chemistries  Recent Labs  Lab 10/09/18 0655  NA 137  K 3.7  CL 91*  CO2 39*  GLUCOSE 89  BUN 13  CREATININE 0.45  CALCIUM 8.6*   ------------------------------------------------------------------------------------------------------------------  Cardiac Enzymes No results for input(s): TROPONINI in the last 168 hours. ------------------------------------------------------------------------------------------------------------------  RADIOLOGY:  Dg Chest Port 1 View  Result Date: 10/07/2018 CLINICAL DATA:  Cough EXAM: PORTABLE CHEST 1 VIEW COMPARISON:  10/05/2018 FINDINGS: Postoperative changes bilaterally. Large hiatal hernia again noted, unchanged. Patchy bilateral airspace opacities again noted, stable. Possible small effusions. No acute bony abnormality. IMPRESSION: Stable patchy bilateral airspace disease and small effusions. Electronically Signed   By: Rolm Baptise M.D.   On: 10/07/2018 21:09     ASSESSMENT AND PLAN:    73 year old female with chronic hypoxic respiratory failure on 2 L of oxygen and CAD who presented to the emergency room due to mechanical fall and right sided hip pain.  1.  Nondisplaced right intertrochanteric fracture:  9  Day Post-Op Procedure(s) (LRB): INTRAMEDULLARY (IM) NAIL INTERTROCHANTRIC  DVT prophylaxis with Eliquis TED hose BLE  x 6 weeks, remove at night time  Weightbearing as tolerated to right leg One unit PRB today Continue PPI S/p 1 unit previously Anemia panel consistent with chronic disease.  Remove staples and apply Steri-Strips on October 14, 2018 Follow-up with [facility] orthopedics 2 to 4 weeks.  2.  Acute on chronic hypoxic respiratory failure with history of bronchiectasis/MAC and left lower lobe resection and acute exacerbation of severe bilateral bronchiectasis which has reso: lved She will need to continue azithromycin and ciprofloxacin for prophylaxis due to history of chronic Mycobacterium AVM infection.     3.  CAD: She should continue metoprolol  4.  Essential hypertension: her blood pressure is acceptable. She will continue diltiazem and metoprolol     5.  PAF: Patient currently in normal sinus rhythm and her heart rate is better controlled this am. Continue Eliquis. Continue diltiazem and metoprolol for heart rate control.  6.  ESBL UTI: She has completed course of meropenem for 5 days.  7. Elevated WBC without Fever: This was reactive. CBC has improved   management plans discussed with the patient and she is in agreement.  CODE STATUS: dnr  TOTAL TIME TAKING CARE OF THIS PATIENT: 22 minutes.   Patient ok to go to SNF today Waiting to hear from insurance.   POSSIBLE D/C when insurance approval    Bettey Costa M.D on 10/09/2018 at 11:06 AM  Between 7am to 6pm - Pager - [phone] After 6pm go to [email or web address] - password [email or web address] [facility]  Office  [phone]  CC: Primary care physician; [name], MD  Note: This dictation was prepared with Dragon dictation along with smaller phrase technology. Any transcriptional errors that result from this process are unintentional.

## 2018-10-09 NOTE — Progress Notes (Signed)
PT Cancellation Note  Patient Details Name: Ana Washington MRN: [id number] DOB: [birth year]   Cancelled Treatment:    Reason Eval/Treat Not Completed: Medical issues which prohibited therapy.  Pt Hgn downtrending, currently 7.3 and nursing to start blood transfusion after lunch.    Roxanne Gates, PT, DPT  Roxanne Gates 10/09/2018, 12:28 PM

## 2018-10-09 NOTE — Progress Notes (Signed)
OT Cancellation Note  Patient Details Name: Ana Washington MRN: [id number] DOB: 12-06-1945   Cancelled Treatment:    Reason Eval/Treat Not Completed: Other (comment) attempted to see patient for OT but was not available and may be discharging today.  Will attempt again later today if she is still here.  Chrys Racer, OTR/L, NTMTC ascom [phone] 10/09/18, 10:57 AM

## 2018-10-09 NOTE — Progress Notes (Signed)
ORTHOPAEDICS PROGRESS NOTE  PATIENT NAME: MARISON MCCALLEN DOB: 05/24/1945  MRN: [id number]  POD # 9: ORIF of right intertrochanteric femur fracture  Subjective: The patient was complaining of burning to the right foot. She states that the right knee pain has resolved.  She denies any significant right hip pain at this time.  Objective: Vital signs in last 24 hours: Temp:  [97.5 F (36.4 C)-98.6 F (37 C)] 97.5 F (36.4 C) (09/08 0328) Pulse Rate:  [71-88] 74 (09/08 0328) Resp:  [18] 18 (09/07 2045) BP: (108-134)/(60-89) 108/61 (09/08 0328) SpO2:  [97 %-100 %] 99 % (09/08 0752)  Intake/Output from previous day: 09/07 0701 - 09/08 0700 In: -  Out: 200 [Urine:200]  Recent Labs    10/07/18 0356 10/09/18 0655  WBC 18.4* 11.3*  HGB 8.1* 7.3*  HCT 25.2* 22.9*  PLT 413* 434*  K  --  3.7  CL  --  91*  CO2  --  39*  BUN  --  13  CREATININE  --  0.45  GLUCOSE  --  89  CALCIUM  --  8.6*    EXAM General: Frail-appearing female seen in mild discomfort. Right lower extremity: Right hip dressing is intact.  No significant thigh swelling or tenderness.  Right knee swelling has completely resolved.  No gross effusion.  No erythema or increased warmth.  No tenderness to palpation about the knee.  The "footie" was removed from the right foot and it appeared to be somewhat tight.  Padding had been applied to the medial and lateral aspects of the foot.  No evidence of erythema or skin breakdown to these areas.  The patient did note improvement following removal of the footie Neurologic: Awake, alert, and oriented.  Sensory and motor function are grossly intact.  Assessment: ORIF of right intertrochanteric femur fracture  Plan: The patient had been transferred to telemetry due to a rapid A. fib.  Rate is controlled at this time. Notes from physical therapy were reviewed.  Extremely slow progress with mobilization of the patient. Plan is to go Skilled nursing facility after hospital  stay.  Care Management is awaiting insurance authorization. DVT Prophylaxis - Eliquis and SCDs  James P. Holley Bouche M.D.

## 2018-10-09 NOTE — Progress Notes (Signed)
Pulmonary Medicine          Date: 10/09/2018,   MRN# [id number] Ana Washington 1945-11-15     AdmissionWeight: 47.6 kg                 CurrentWeight: 47.6 kg      CHIEF COMPLAINT:   Acute on chronic respiratory failure   SUBJECTIVE     Pt s/p PRBC transfusion,  Is mildly tachypneic.  No changes on CXR.  Plan for rehab as per surgery and hospitalist team.   Had + exposure to covid contact. Moved to neg pressure room.    She is now down to 3Lmin O2 theray which is home setting.    PAST MEDICAL HISTORY   Past Medical History:  Diagnosis Date   Arthritis    Atrial fibrillation (HCC)    Bronchiectasis (HCC)    CAD (coronary artery disease)    CAD (coronary artery disease) 07/30/2017   Dumping syndrome    Dyspnea    Dysrhythmia    afib   Essential hypertension, malignant 10/03/2013   Family history of adverse reaction to anesthesia    sister [name]   GERD (gastroesophageal reflux disease)    Headache    MIGRAINES   Hypertension    Lung disease    Myocardial infarction ([place]) 2007   Non-STEMI   [name] (postoperative nausea and vomiting)    Psoriasis    PUD (peptic ulcer disease)      SURGICAL HISTORY   Past Surgical History:  Procedure Laterality Date   BACK SURGERY     CHOLECYSTECTOMY     EYE SURGERY     FOOT SURGERY     INTRAMEDULLARY (IM) NAIL INTERTROCHANTERIC Right 09/30/2018   Procedure: INTRAMEDULLARY (IM) NAIL INTERTROCHANTRIC;  Surgeon: Dereck Leep, MD;  Location: [facility];  Service: Orthopedics;  Laterality: Right;   KYPHOPLASTY N/A 07/05/2016   Procedure: KYPHOPLASTY T - 9;  Surgeon: Hessie Knows, MD;  Location: [facility];  Service: Orthopedics;  Laterality: N/A;   KYPHOPLASTY N/A 11/29/2017   Procedure: Iona Hansen;  Surgeon: Hessie Knows, MD;  Location: [facility];  Service: Orthopedics;  Laterality: N/A;  L2 and L3   KYPHOPLASTY N/A 12/18/2017   Procedure: KYPHOPLASTY L1;  Surgeon: Hessie Knows, MD;   Location: [facility];  Service: Orthopedics;  Laterality: N/A;   KYPHOPLASTY N/A 01/05/2018   Procedure: KYPHOPLASTY-T11,T12;  Surgeon: Hessie Knows, MD;  Location: [facility];  Service: Orthopedics;  Laterality: N/A;   KYPHOPLASTY N/A 04/05/2018   Procedure: T10 KYPHOPLASTY;  Surgeon: Hessie Knows, MD;  Location: [facility];  Service: Orthopedics;  Laterality: N/A;   KYPHOPLASTY N/A 04/12/2018   Procedure: KYPHOPLASTY T7,8;  Surgeon: Hessie Knows, MD;  Location: [facility];  Service: Orthopedics;  Laterality: N/A;   KYPHOPLASTY N/A 04/19/2018   Procedure: KYPHOPLASTY T5, T6;  Surgeon: Hessie Knows, MD;  Location: [facility];  Service: Orthopedics;  Laterality: N/A;   LUNG SURGERY  1990 and 1996   THOROCOTOMY WITH LOBECTOMY     LEFT LOWER THORACOTOMY / RIGHT MIDDLE LOBECTOMY     FAMILY HISTORY   Family History  Problem Relation Age of Onset   Hypertension Mother    Hypertension Father      SOCIAL HISTORY   Social History   Tobacco Use   Smoking status: Never Smoker   Smokeless tobacco: Never Used  Substance Use Topics   Alcohol use: No   Drug use: No     MEDICATIONS  Home Medication:    Current Medication:  Current Facility-Administered Medications:    0.9 %  sodium chloride infusion (Manually program via Guardrails IV Fluids), , Intravenous, Once, Bettey Costa, MD   acetaminophen (TYLENOL) tablet 325-650 mg, 325-650 mg, Oral, Q6H PRN, Hooten, Laurice Record, MD, 650 mg at 10/07/18 2122   acidophilus (RISAQUAD) capsule 1 capsule, 1 capsule, Oral, Daily, Hooten, Laurice Record, MD, 1 capsule at 10/09/18 0816   amLODipine (NORVASC) tablet 5 mg, 5 mg, Oral, Daily, Mody, Sital, MD, 5 mg at 10/09/18 0816   apixaban (ELIQUIS) tablet 5 mg, 5 mg, Oral, BID, Hooten, Laurice Record, MD, 5 mg at 10/09/18 0815   azithromycin (ZITHROMAX) tablet 250 mg, 250 mg, Oral, Daily, Hooten, Laurice Record, MD, 250 mg at 10/09/18 0816   bisacodyl (DULCOLAX) suppository 10 mg, 10 mg, Rectal, Daily PRN,  Hooten, Laurice Record, MD   calcium-vitamin D (OSCAL WITH D) 500-200 MG-UNIT per tablet 1 tablet, 1 tablet, Oral, Daily, Hooten, Laurice Record, MD, 1 tablet at 10/08/18 1327   chlorpheniramine-HYDROcodone (TUSSIONEX) 10-8 MG/5ML suspension 5 mL, 5 mL, Oral, QHS, Hooten, Laurice Record, MD, 5 mL at 10/08/18 2308   diltiazem (CARDIZEM CD) 24 hr capsule 120 mg, 120 mg, Oral, Daily, Hooten, Laurice Record, MD, 120 mg at 10/09/18 0816   feeding supplement (ENSURE ENLIVE) (ENSURE ENLIVE) liquid 237 mL, 237 mL, Oral, BID BM, Hooten, Laurice Record, MD, 237 mL at 10/06/18 1034   ferrous sulfate tablet 325 mg, 325 mg, Oral, Daily, Hooten, Laurice Record, MD, 325 mg at 10/08/18 2035   gabapentin (NEURONTIN) capsule 300 mg, 300 mg, Oral, QID, Hooten, Laurice Record, MD, 300 mg at 10/09/18 0816   guaiFENesin-dextromethorphan (ROBITUSSIN DM) 100-10 MG/5ML syrup 5 mL, 5 mL, Oral, Q4H PRN, Hooten, Laurice Record, MD   HYDROmorphone (DILAUDID) injection 0.5-1 mg, 0.5-1 mg, Intravenous, Q4H PRN, Hooten, Laurice Record, MD, 0.5 mg at 10/04/18 1318   Ipratropium-Albuterol (COMBIVENT) respimat 2 puff, 2 puff, Inhalation, TID, Bettey Costa, MD, 2 puff at 10/09/18 0818   LORazepam (ATIVAN) tablet 0.5 mg, 0.5 mg, Oral, Q4H, Hooten, Laurice Record, MD, 0.5 mg at 10/08/18 2306   magnesium hydroxide (MILK OF MAGNESIA) suspension 30 mL, 30 mL, Oral, Daily PRN, Hooten, Laurice Record, MD, 30 mL at 10/02/18 0951   menthol-cetylpyridinium (CEPACOL) lozenge 3 mg, 1 lozenge, Oral, PRN **OR** phenol (CHLORASEPTIC) mouth spray 1 spray, 1 spray, Mouth/Throat, PRN, Hooten, Laurice Record, MD   metoCLOPramide (REGLAN) tablet 5-10 mg, 5-10 mg, Oral, Q8H PRN **OR** metoCLOPramide (REGLAN) injection 5-10 mg, 5-10 mg, Intravenous, Q8H PRN, Hooten, Laurice Record, MD   metoprolol succinate (TOPROL-XL) 24 hr tablet 50 mg, 50 mg, Oral, QHS, Hooten, Laurice Record, MD, 50 mg at 10/08/18 2306   multivitamin with minerals tablet 1 tablet, 1 tablet, Oral, Daily, Hooten, Laurice Record, MD, 1 tablet at 10/08/18 2034   ondansetron  (ZOFRAN) tablet 4 mg, 4 mg, Oral, Q6H PRN **OR** ondansetron (ZOFRAN) injection 4 mg, 4 mg, Intravenous, Q6H PRN, Hooten, Laurice Record, MD   oxyCODONE (Oxy IR/ROXICODONE) immediate release tablet 10 mg, 10 mg, Oral, Q4H PRN, Hooten, Laurice Record, MD   oxyCODONE (Oxy IR/ROXICODONE) immediate release tablet 5 mg, 5 mg, Oral, Q4H PRN, Hooten, Laurice Record, MD   pantoprazole (PROTONIX) EC tablet 40 mg, 40 mg, Oral, Daily, Hooten, Laurice Record, MD, 40 mg at 10/09/18 0815   polyethylene glycol (MIRALAX / GLYCOLAX) packet 17 g, 17 g, Oral, Daily, Mody, Sital, MD, 17 g at 10/09/18 0815   senna-docusate (Senokot-S) tablet 1  tablet, 1 tablet, Oral, BID, Hooten, Laurice Record, MD, 1 tablet at 10/09/18 0817   sodium chloride HYPERTONIC 3 % nebulizer solution 3 mL, 3 mL, Nebulization, Daily, Mody, Sital, MD, 3 mL at 10/09/18 0742   sodium phosphate (FLEET) 7-19 GM/118ML enema 1 enema, 1 enema, Rectal, Once PRN, Hooten, Laurice Record, MD   sucralfate (CARAFATE) tablet 1 g, 1 g, Oral, QID, Hooten, Laurice Record, MD, 1 g at 10/09/18 0816   traMADol (ULTRAM) tablet 50-100 mg, 50-100 mg, Oral, Q4H PRN, Dereck Leep, MD, 50 mg at 10/08/18 2033   vitamin C (ASCORBIC ACID) tablet 1,000 mg, 1,000 mg, Oral, Daily, Hooten, Laurice Record, MD, 1,000 mg at 10/09/18 0815    ALLERGIES   Sulfa antibiotics, Codeine, and Penicillins     REVIEW OF SYSTEMS    Review of Systems:  Gen:  Denies  fever, sweats, chills weigh loss  HEENT: Denies blurred vision, double vision, ear pain, eye pain, hearing loss, nose bleeds, sore throat Cardiac:  No dizziness, chest pain or heaviness, chest tightness,edema Resp:   Denies cough or sputum porduction, shortness of breath,wheezing, hemoptysis,  Gi: Denies swallowing difficulty, stomach pain, nausea or vomiting, diarrhea, constipation, bowel incontinence Gu:  Denies bladder incontinence, burning urine Ext:   Denies Joint pain, stiffness or swelling Skin: Denies  skin rash, easy bruising or bleeding or  hives Endoc:  Denies polyuria, polydipsia , polyphagia or weight change Psych:   Denies depression, insomnia or hallucinations   Other:  All other systems negative   VS: BP 119/83 (BP Location: Left Arm)    Pulse 79    Temp 98.1 F (36.7 C) (Oral)    Resp 16    Ht _0  (1.651 m)    Wt 47.6 kg    SpO2 100%    BMI 17.47 kg/m      PHYSICAL EXAM    GENERAL:NAD, no fevers, chills, no weakness no fatigue HEAD: Normocephalic, atraumatic.  EYES: Pupils equal, round, reactive to light. Extraocular muscles intact. No scleral icterus.  MOUTH: Moist mucosal membrane. Dentition intact. No abscess noted.  EAR, NOSE, THROAT: Clear without exudates. No external lesions.  NECK: Supple. No thyromegaly. No nodules. No JVD.  PULMONARY:improved rhonchi  CARDIOVASCULAR: S1 and S2. Regular rate and rhythm. No murmurs, rubs, or gallops. No edema. Pedal pulses 2+ bilaterally.  GASTROINTESTINAL: Soft, nontender, nondistended. No masses. Positive bowel sounds. No hepatosplenomegaly.  MUSCULOSKELETAL: No swelling, clubbing, or edema. Range of motion full in all extremities.  NEUROLOGIC: Cranial nerves II through XII are intact. No gross focal neurological deficits. Sensation intact. Reflexes intact.  SKIN: No ulceration, lesions, rashes, or cyanosis. Skin warm and dry. Turgor intact.  PSYCHIATRIC: Mood, affect within normal limits. The patient is awake, alert and oriented x 3. Insight, judgment intact.       IMAGING    Dg Chest 1 View  Result Date: 09/30/2018 CLINICAL DATA:  Hip fracture.  Preoperative exam. EXAM: CHEST  1 VIEW COMPARISON:  09/27/2018.  CT 09/28/2018. FINDINGS: Heart size is stable. Previous pulmonary resection in the right midlung. Widespread bilateral pulmonary opacity persists, shown by CT to be secondary to widespread bronchiectasis and alveolar opacities. No radiographic change. Multiple old vertebral augmentations. IMPRESSION: No significant radiographic change. Extensive widespread  bilateral pulmonary opacities as above and discussed completely by chest CT yesterday. Electronically Signed   By: Nelson Chimes M.D.   On: 09/30/2018 08:02   Ct Chest W Contrast  Result Date: 09/28/2018 CLINICAL DATA:  Inpatient. Dyspnea. Abnormal  chest radiograph with question new suprahilar opacity on the right. Clinical concern for interstitial lung disease. EXAM: CT CHEST WITH CONTRAST TECHNIQUE: Multidetector CT imaging of the chest was performed during intravenous contrast administration. CONTRAST:  51m OMNIPAQUE IOHEXOL 300 MG/ML  SOLN COMPARISON:  Chest radiograph from one day prior. 04/08/2018 chest CT angiogram. FINDINGS: Cardiovascular: Top-normal heart size. No significant pericardial effusion/thickening. Left anterior descending coronary atherosclerosis. Atherosclerotic nonaneurysmal thoracic aorta. Normal caliber main pulmonary artery. No central pulmonary emboli. Mediastinum/Nodes: No discrete thyroid nodules. Unremarkable esophagus. No axillary adenopathy. Mildly enlarged 1.3 cm low right paratracheal lymph node (series 2/image 56) and mildly enlarged 1.4 cm subcarinal node (series 2/image 67), both stable since 04/08/2018. No new pathologically enlarged mediastinal nodes. No hilar adenopathy. Lungs/Pleura: Status post left lower and right middle lobectomies. No pneumothorax. Trace dependent left pleural effusion. No right pleural effusion. There is diffuse moderate cylindrical and varicoid bronchiectasis throughout both lungs with associated diffuse bronchial wall thickening. No acute consolidative airspace disease or lung masses. Mild patchy tree-in-bud opacity and scattered mucoid impaction associated with the areas of bronchiectasis, substantially decreased in the interval. Extensive parenchymal bands throughout the periphery of both lungs compatible with postinfectious/postinflammatory scarring, unchanged. No significant pulmonary nodules. Mild interlobular septal thickening and mosaic  attenuation throughout both lungs, similar to prior. Upper abdomen: Cholecystectomy. Stable moderate intrahepatic biliary ductal dilatation. Simple 3.7 cm lateral left renal cyst. Musculoskeletal: No aggressive appearing focal osseous lesions. Multilevel thoracolumbar vertebral compression fractures from T5 through L3 with associated vertebroplasty material at each level. IMPRESSION: 1. Diffuse cylindrical and varicoid bronchiectasis in both lungs with associated bandlike scarring throughout the periphery of both lungs. Mild patchy tree-in-bud opacities and scattered mucoid impaction is significantly improved since 04/08/2018 chest CT. 2. Prior left lower and right middle lobectomy. 3. Chronic mosaic attenuation in both lungs is probably due to air trapping. 4. Mild mediastinal lymphadenopathy is stable and probably reactive. 5. One vessel coronary atherosclerosis. Aortic Atherosclerosis (ICD10-I70.0). Electronically Signed   By: JIlona SorrelM.D.   On: 09/28/2018 16:53   Mr Pelvis Wo Contrast  Result Date: 09/27/2018 CLINICAL DATA:  Right hip pain after a fall. Abnormal radiographs suggesting right sacral fracture. EXAM: MRI PELVIS WITHOUT CONTRAST TECHNIQUE: Multiplanar multisequence MR imaging of the pelvis was performed. No intravenous contrast was administered. COMPARISON:  Radiographs dated 09/27/2018 FINDINGS: Musculoskeletal: There is an acute nondisplaced intertrochanteric fracture of the proximal right femur. There is an adjacent tear an intramuscular hematoma in the right gluteus maximus muscle. Is hemorrhage into the right greater trochanteric bursa and into the vastus lateralis muscle. There is also a small strain or tear of the proximal right gluteus maximus muscle. There is a subtle fracture of the third sacral segment extending into the sacral ala I to the right and left. There are subtle nondisplaced fractures of right and left pubic bodies. There are old healed fractures of the right inferior  and superior pubic rami. Urinary Tract:  No abnormality visualized. Bowel:  Numerous diverticula in distal colon.  Otherwise negative. Vascular/Lymphatic: No pathologically enlarged lymph nodes. No significant vascular abnormality seen. Reproductive:  No mass or other significant abnormality Other:  None. IMPRESSION: 1. Acute nondisplaced intertrochanteric fracture of the proximal right femur. 2. Nondisplaced subtle fractures of the S3 segment of the sacrum and of both pubic bodies. 3. Intramuscular tear and small hematoma and strain of the right gluteus maximus muscle. Small hematoma extending into the vastus lateralis muscle. Electronically Signed   By: JLorriane ShireM.D.  On: 09/27/2018 16:45   Dg Chest Port 1 View  Result Date: 10/07/2018 CLINICAL DATA:  Cough EXAM: PORTABLE CHEST 1 VIEW COMPARISON:  10/05/2018 FINDINGS: Postoperative changes bilaterally. Large hiatal hernia again noted, unchanged. Patchy bilateral airspace opacities again noted, stable. Possible small effusions. No acute bony abnormality. IMPRESSION: Stable patchy bilateral airspace disease and small effusions. Electronically Signed   By: Rolm Baptise M.D.   On: 10/07/2018 21:09   Dg Chest Port 1 View  Result Date: 10/05/2018 CLINICAL DATA:  Pulmonary infiltrates. EXAM: PORTABLE CHEST 1 VIEW COMPARISON:  CT scan and chest x-ray dated 09/30/2018 and chest x-ray dated 09/27/2018 FINDINGS: The hazy areas of infiltrate lungs have improved bilaterally. Postsurgical changes in the right upper lobe are noted with surgical staples. Huge hiatal hernia appears unchanged. Chronic bronchiectasis is not as apparent on chest x-ray as on the CT scan. Heart size and pulmonary vascularity are normal. No acute bone abnormality. IMPRESSION: Improving bilateral infiltrates. Electronically Signed   By: Lorriane Shire M.D.   On: 10/05/2018 11:38   Dg Chest Port 1 View  Result Date: 09/27/2018 CLINICAL DATA:  COPD, fall EXAM: PORTABLE CHEST 1 VIEW  COMPARISON:  10/09/2017 FINDINGS: Cardiac silhouette appears within normal limits. Calcific aortic knob. Diffuse interstitial thickening throughout both lungs. Prominent right suprahilar opacity, which appears more prominent compared to prior. There is hazy left basilar opacity, some of which may represent a component of hiatal hernia, better seen on prior exam. No pneumothorax is seen. The bones are diffusely demineralized. There are multiple thoracic spine compression deformity status post cement augmentation. No definite acute osseous abnormality within the limitations of this exam. IMPRESSION: 1. Prominent right suprahilar opacity, new from prior. Further evaluation with contrast-enhanced CT of the chest is recommended. 2. Hazy left basilar opacity. Electronically Signed   By: Davina Poke M.D.   On: 09/27/2018 13:59   Dg Knee Right Port  Result Date: 10/05/2018 CLINICAL DATA:  Right knee pain. No reported injury. EXAM: PORTABLE RIGHT KNEE - 1-2 VIEW COMPARISON:  None. FINDINGS: Medial and lateral meniscal calcifications. Minimal posterior patellar spur formation. Minimal effusion. The bones appear osteopenic. Arterial calcifications. IMPRESSION: 1. Minimal degenerative changes and minimal effusion. 2. Chondrocalcinosis. Electronically Signed   By: Claudie Revering M.D.   On: 10/05/2018 18:46   Dg Hip Operative Unilat W Or W/o Pelvis Right  Result Date: 09/30/2018 CLINICAL DATA:  Right hip fracture ORIF. EXAM: OPERATIVE RIGHT HIP (WITH PELVIS IF PERFORMED) TECHNIQUE: Fluoroscopic spot image(s) were submitted for interpretation post-operatively. Fluoroscopy time was 1.0 minutes. COMPARISON:  Right hip x-rays dated September 27, 2018. FINDINGS: Multiple intraoperative fluoroscopic images demonstrate interval gamma nail fixation of the right intertrochanteric femur fracture. Alignment is anatomic. IMPRESSION: 1. Intraoperative fluoroscopic guidance for right intertrochanteric femur fracture ORIF. Electronically  Signed   By: Titus Dubin M.D.   On: 09/30/2018 11:28   Dg Hip Unilat With Pelvis 2-3 Views Right  Result Date: 09/27/2018 CLINICAL DATA:  73 year old female with a history of right hip pain after a fall EXAM: DG HIP (WITH OR WITHOUT PELVIS) 2-3V RIGHT COMPARISON:  None. FINDINGS: Osteopenia. Bony pelvic ring appears intact. Partially healed right inferior pubic ramus fracture. Disruption right-sided sacral foramen. Nondisplaced right intertrochanteric fracture is identified on the cross-table lateral view. Changes of prior vertebral augmentation. IMPRESSION: Nondisplaced right intertrochanteric fracture, best seen on the cross-table lateral view Questionable acute right sacral fracture with disruption of the sacral foramen. Further evaluation with pelvic MRI may be useful. Incomplete healing/remodeling of right  inferior pubic ramus fracture. Osteopenia. Electronically Signed   By: Corrie Mckusick D.O.   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       -patient dropped from 140lbs to 105.  She states in past year she actually gained weight appx 5lbs     - would recommend to continue chest physiotherapy with MetaNeb to recruit atelectatic segments.       -reviewed CXR from 9/6 reviewed - mild RLL atelectasis compared to 8/30 film      -patient followed by Dr RAshby Daweson outpatient basis and will be seen by him post hospitalization      Acute exacerbation of severe bilateral bronchiectasis     - resolved , s/p prednisone taper.       - VEST therapy , monitor for signs of pain for hip repair,  switch to METANEB if possible      - antibiotic regiment switched to meropenem IV     - nebulized mucomyst 430mbid with albuterol      -pt moved to negative pressure room due to COVID exposure from sister who is +Covid       -d/c to rehab as per Dr MoBenjie Karvonenf clinically appropriate       Thank you for allowing me to participate in the care of this patient.   Patient/Family are satisfied with care plan and all questions have been answered.  This document was prepared using Dragon voice recognition software and may include unintentional dictation errors.     FuOttie GlazierM.D.  Division of [employer]

## 2018-10-09 NOTE — Progress Notes (Signed)
PT Cancellation Note  Patient Details Name: Ana Washington MRN: [id number] DOB: 17-Sep-1945   Cancelled Treatment:    Reason Eval/Treat Not Completed: Medical issues which prohibited therapy Pt still receiving blood transfusion, will hold PT this date and try back when pt is appropriate.  Kreg Shropshire, DPT 10/09/2018, 4:43 PM

## 2018-10-09 NOTE — TOC Progression Note (Signed)
Transition of Care [facility]) - Progression Note    Patient Details  Name: Ana Washington MRN: [id number] Date of Birth: 29-Dec-1945  Transition of Care [facility] CM/SW Contact  Ross Ludwig, [place] Phone Number: 10/09/2018, 5:14 PM  Clinical Narrative:    CSW received phone call from [place] at [facility], he said they have received insurance authorization and can accept patient tomorrow if she is medically ready for discharge and orders have been received.  Marden Noble said he does not need another Covid test.  CSW to update [facility] case Freight forwarder following patient.   Expected Discharge Plan: [place] Barriers to Discharge: Continued Medical Work up  Expected Discharge Plan and Services Expected Discharge Plan: [place] In-house Referral: Clinical Social Work Discharge Planning Services: CM Consult   Living arrangements for the past 2 months: Single Family Home Expected Discharge Date: 10/06/18                                     Social Determinants of Health (SDOH) Interventions    Readmission Risk Interventions Readmission Risk Prevention Plan 01/06/2018  Transportation Screening Complete  PCP or Specialist Appt within 5-7 Days Complete  Home Care Screening Complete  Medication Review (RN CM) Complete  Some recent data might be hidden

## 2018-10-10 DIAGNOSIS — S72141A Displaced intertrochanteric fracture of right femur, initial encounter for closed fracture: Secondary | ICD-10-CM | POA: Diagnosis not present

## 2018-10-10 LAB — TYPE AND SCREEN
ABO/RH(D): A POS
Antibody Screen: NEGATIVE
Unit division: 0

## 2018-10-10 LAB — CBC
HCT: 26.7 % — ABNORMAL LOW (ref 36.0–46.0)
Hemoglobin: 8.9 g/dL — ABNORMAL LOW (ref 12.0–15.0)
MCH: 31.8 pg (ref 26.0–34.0)
MCHC: 33.3 g/dL (ref 30.0–36.0)
MCV: 95.4 fL (ref 80.0–100.0)
Platelets: 397 10*3/uL (ref 150–400)
RBC: 2.8 MIL/uL — ABNORMAL LOW (ref 3.87–5.11)
RDW: 15.9 % — ABNORMAL HIGH (ref 11.5–15.5)
WBC: 10.7 10*3/uL — ABNORMAL HIGH (ref 4.0–10.5)
nRBC: 0 % (ref 0.0–0.2)

## 2018-10-10 LAB — BPAM RBC
Blood Product Expiration Date: 202009162359
ISSUE DATE / TIME: 202009081258
Unit Type and Rh: 600

## 2018-10-10 LAB — SARS CORONAVIRUS 2 BY RT PCR (HOSPITAL ORDER, PERFORMED IN ~~LOC~~ HOSPITAL LAB): SARS Coronavirus 2: NEGATIVE

## 2018-10-10 LAB — ABO/RH: ABO/RH(D): A POS

## 2018-10-10 MED ORDER — DILTIAZEM HCL 25 MG/5ML IV SOLN
15.0000 mg | Freq: Once | INTRAVENOUS | Status: AC
  Administered 2018-10-10: 11:00:00 15 mg via INTRAVENOUS
  Filled 2018-10-10: qty 5

## 2018-10-10 NOTE — Progress Notes (Signed)
Pt had a MEWS score of 3 this morning due to pt converting from NSR to Afib with RVR with a heart rate in the 140-150s. Pt was asymptomatic. MD made aware and IV Cardizem has ordered and given. Pt is still in Afib but heart rate is now in 100-120s. Pt resting comfortably. Will continue to monitor.

## 2018-10-10 NOTE — Care Management Important Message (Signed)
Important Message  Patient Details  Name: Ana Washington MRN: [id number] Date of Birth: 08-Jun-1945   Medicare Important Message Given:  Yes     Elza Rafter, RN 10/10/2018, 10:50 AM

## 2018-10-10 NOTE — Progress Notes (Signed)
   Subjective: 10 Days Post-Op Procedure(s) (LRB): INTRAMEDULLARY (IM) NAIL INTERTROCHANTRIC (Right) Patient reports pain as mild. Patient is well, and has had no acute complaints or problems Denies any CP,  ABD pain. We will continue with physical therapy today.   Objective: Vital signs in last 24 hours: Temp:  [98 F (36.7 C)-100.2 F (37.9 C)] 98 F (36.7 C) (09/09 0812) Pulse Rate:  [76-92] 82 (09/09 0812) Resp:  [16-24] 19 (09/09 0812) BP: (98-144)/(72-88) 144/88 (09/09 0812) SpO2:  [94 %-100 %] 100 % (09/09 0812)  Intake/Output from previous day: 09/08 0701 - 09/09 0700 In: 525 [I.V.:125; Blood:400] Out: 200 [Urine:200] Intake/Output this shift: No intake/output data recorded.  Recent Labs    10/09/18 0655 10/09/18 2230 10/10/18 0756  HGB 7.3* 9.0* 8.9*   Recent Labs    10/09/18 0655 10/09/18 2230 10/10/18 0756  WBC 11.3*  --  10.7*  RBC 2.26*  --  2.80*  HCT 22.9* 26.6* 26.7*  PLT 434*  --  397   Recent Labs    10/09/18 0655  NA 137  K 3.7  CL 91*  CO2 39*  BUN 13  CREATININE 0.45  GLUCOSE 89  CALCIUM 8.6*   No results for input(s): LABPT, INR in the last 72 hours.  EXAM General - Patient is Alert, Appropriate and Oriented Extremity - Neurovascular intact Sensation intact distally Intact pulses distally Dorsiflexion/Plantar flexion intact No cellulitis present Compartment soft  Quadricep muscle soft, no defect in the quad tendon. Dressing - dressing C/D/I and scant drainage Motor Function - intact, moving foot and toes well on exam.   Past Medical History:  Diagnosis Date  . Arthritis   . Atrial fibrillation ([place])   . Bronchiectasis ([place])   . CAD (coronary artery disease) 07/30/2017  . Dumping syndrome   . Dyspnea   . Dysrhythmia    afib  . 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 Family history of adverse reaction to anesthesia    sister [name]  . GERD (gastroesophageal reflux disease)   . Headache    MIGRAINES  .  Hypertension   . Lung disease   . Myocardial infarction ([place]) 2007   Non-STEMI  . [name] (postoperative nausea and vomiting)   . Psoriasis   . PUD (peptic ulcer disease)     Assessment/Plan:   10 Days Post-Op Procedure(s) (LRB): INTRAMEDULLARY (IM) NAIL INTERTROCHANTRIC (Right) Active Problems:   Closed right hip fracture (HCC)   Acute post op blood loss anemia   Estimated body mass index is 17.47 kg/m as calculated from the following:   Height as of this encounter: 5\' 5"  (1.651 m).   Weight as of this encounter: 47.6 kg. Advance diet Up with therapy  Acute post op blood loss anemia - Hgb 8.9 stable. Pain well controlled CM to assist with discharge to SNF  Remove staples and apply steri strips on 10/14/2018 Follow up with [facility] orthopedics in 6 weeks TED hose BLE x 6 weeks, remove at night time  DVT Prophylaxis - TED hose and SCDs, Eliquis Weight-Bearing as tolerated to right leg   T. Rachelle Hora, PA-C [place] 10/10/2018, 10:17 AM

## 2018-10-10 NOTE — Progress Notes (Signed)
[facility] [place] NAME: Shanigua Gibb    MR#:  [id number]  DATE OF BIRTH:  1945-03-27  SUBJECTIVE:  She finally has insurance approval however she was noted to be A. fib RVR.  Patient given 10 mg of IV diltiazem.  Denies chest pain or palpitations.  Shortness of breath is at her baseline.  REVIEW OF SYSTEMS:    Review of Systems  Constitutional: Negative for fever, chills weight loss HENT: Negative for ear pain, nosebleeds, congestion, facial swelling, rhinorrhea, neck pain, neck stiffness and ear discharge.   Respiratory: Negative for cough, shortness of breath, wheezing  Cardiovascular: Negative for chest pain, palpitations and leg swelling.  Gastrointestinal: Negative for heartburn, abdominal pain, vomiting, diarrhea or consitpation Genitourinary: Negative for dysuria, urgency, frequency, hematuria Musculoskeletal: Negative for back pain denies hip pain Neurological: Negative for dizziness, seizures, syncope, focal weakness,  numbness and headaches.  Hematological: Does not bruise/bleed easily.  Psychiatric/Behavioral: Negative for hallucinations, confusion, dysphoric mood    Tolerating Diet: yes     DRUG ALLERGIES:   Allergies  Allergen Reactions  . Sulfa Antibiotics Diarrhea  . Codeine Nausea And Vomiting  . Penicillins Rash    Has patient had a PCN reaction causing immediate rash, facial/tongue/throat swelling, SOB or lightheadedness with hypotension: Unknown Has patient had a PCN reaction causing severe rash involving mucus membranes or skin necrosis: Unknown Has patient had a PCN reaction that required hospitalization: Unknown Has patient had a PCN reaction occurring within the last 10 years: No If all of the above answers are "NO", then may proceed with Cephalosporin use.     VITALS:  Blood pressure (!) 144/88, pulse 82, temperature 98 F (36.7 C), temperature source Oral, resp. rate 19, height _0  (1.651 m),  weight 47.6 kg, SpO2 100 %.  PHYSICAL EXAMINATION:  Constitutional: Appears well-developed and well-nourished. No distress. HENT: Normocephalic. [name] Oropharynx is clear and moist.  Eyes: Conjunctivae and EOM are normal. PERRLA, no scleral icterus.  Neck: Normal ROM. Neck supple. No JVD. No tracheal deviation. CVS: RRR, S1/S2 +, no murmurs, no gallops, no carotid bruit.  Pulmonary: b/l rhonchii Abdominal: Soft. BS +,  no distension, tenderness, rebound or guarding.  Musculoskeletal: can move her legs Neuro: Alert. CN 2-12 grossly intact. No focal deficits. Skin: Skin is warm and dry. No rash noted. Psychiatric: Normal mood and affect.      LABORATORY PANEL:   CBC Recent Labs  Lab 10/10/18 0756  WBC 10.7*  HGB 8.9*  HCT 26.7*  PLT 397   ------------------------------------------------------------------------------------------------------------------  Chemistries  Recent Labs  Lab 10/09/18 0655  NA 137  K 3.7  CL 91*  CO2 39*  GLUCOSE 89  BUN 13  CREATININE 0.45  CALCIUM 8.6*   ------------------------------------------------------------------------------------------------------------------  Cardiac Enzymes No results for input(s): TROPONINI in the last 168 hours. ------------------------------------------------------------------------------------------------------------------  RADIOLOGY:  No results found.   ASSESSMENT AND PLAN:    73 year old female with chronic hypoxic respiratory failure on 2 L of oxygen and CAD who presented to the emergency room due to mechanical fall and right sided hip pain.  1.  Nondisplaced right intertrochanteric fracture:  10 Day Post-Op Procedure(s) (LRB): INTRAMEDULLARY (IM) NAIL INTERTROCHANTRIC  DVT prophylaxis with Eliquis TED hose BLE x 6 weeks, remove at night time  Weightbearing as tolerated to right leg One 2 units PRBC Continue PPI Anemia panel consistent with chronic disease.  Remove staples and apply Steri-Strips  on October 14, 2018 Follow-up with [facility]opedics 2  to 4 weeks.  2.  Acute on chronic hypoxic respiratory failure with history of bronchiectasis/MAC and left lower lobe resection and acute exacerbation of severe bilateral bronchiectasis which has reso: lved She will need to continue azithromycin and ciprofloxacin for prophylaxis due to history of chronic Mycobacterium AVM infection.     3.  CAD: She should continue metoprolol  4.  Essential hypertension: her blood pressure is acceptable. She will continue diltiazem and metoprolol     5.  PAF: Patient given 1 dose of IV diltiazem.  We will continue telemetry monitoring. Continue Eliquis. Continue diltiazem and metoprolol for heart rate control.  6.  ESBL UTI: She has completed course of meropenem for 5 days.  7. Elevated WBC without Fever: This was reactive. CBC has improved   management plans discussed with the patient and she is in agreement.  CODE STATUS: dnr  TOTAL TIME TAKING CARE OF THIS PATIENT: 25 minutes.   Has approval from insurance.  Will reevaluate heart rate for discharge planning.     Bettey Costa M.D on 10/10/2018 at 11:36 AM  Between 7am to 6pm - Pager - [phone] After 6pm go to [email or web address] - password [email or web address] [facility]  Office  [phone]  CC: Primary care physician; [name], MD  Note: This dictation was prepared with Dragon dictation along with smaller phrase technology. Any transcriptional errors that result from this process are unintentional.

## 2018-10-10 NOTE — Progress Notes (Signed)
EMS called for transport to [place] at 1730. EMS called back at 1743 and said that they wouldn't be able to transport pt tonight because it was too late and their convalescent trucks stopped at 6pm. Pts daughter, Marlowe Kays, updated. Pt is ready for discharge first thing in the morning.

## 2018-10-11 MED ORDER — DILTIAZEM HCL ER COATED BEADS 180 MG PO CP24: 180.0000 mg | ORAL_CAPSULE | Freq: Every day | ORAL | Status: DC

## 2018-10-11 MED ORDER — METOPROLOL SUCCINATE ER 100 MG PO TB24: 100.0000 mg | ORAL_TABLET | Freq: Every day | ORAL | Status: DC

## 2018-10-11 MED ORDER — DILTIAZEM HCL ER COATED BEADS 180 MG PO CP24
180.0000 mg | ORAL_CAPSULE | Freq: Every day | ORAL | Status: DC
  Administered 2018-10-11: 180 mg via ORAL
  Filled 2018-10-11: qty 1

## 2018-10-11 NOTE — Progress Notes (Signed)
   Subjective: 11 Days Post-Op Procedure(s) (LRB): INTRAMEDULLARY (IM) NAIL INTERTROCHANTRIC (Right) Patient reports pain as mild. Patient is well, and has had no acute complaints or problems Denies any CP,  ABD pain. We will continue with physical therapy today.   Objective: Vital signs in last 24 hours: Temp:  [98 F (36.7 C)-99.4 F (37.4 C)] 99.4 F (37.4 C) (09/10 0549) Pulse Rate:  [80-115] 80 (09/10 0549) Resp:  [19-24] 24 (09/10 0549) BP: (113-144)/(78-88) 139/78 (09/10 0549) SpO2:  [91 %-100 %] 91 % (09/10 0549)  Intake/Output from previous day: No intake/output data recorded. Intake/Output this shift: No intake/output data recorded.  Recent Labs    10/09/18 0655 10/09/18 2230 10/10/18 0756  HGB 7.3* 9.0* 8.9*   Recent Labs    10/09/18 0655 10/09/18 2230 10/10/18 0756  WBC 11.3*  --  10.7*  RBC 2.26*  --  2.80*  HCT 22.9* 26.6* 26.7*  PLT 434*  --  397   Recent Labs    10/09/18 0655  NA 137  K 3.7  CL 91*  CO2 39*  BUN 13  CREATININE 0.45  GLUCOSE 89  CALCIUM 8.6*   No results for input(s): LABPT, INR in the last 72 hours.  EXAM General - Patient is Alert, Appropriate and Oriented Extremity - Neurovascular intact Sensation intact distally Intact pulses distally Dorsiflexion/Plantar flexion intact No cellulitis present Compartment soft  Quadricep muscle soft, no defect in the quad tendon. Dressing - dressing C/D/I and scant drainage Motor Function - intact, moving foot and toes well on exam.   Past Medical History:  Diagnosis Date  . Arthritis   . Atrial fibrillation ([place])   . Bronchiectasis ([place])   . CAD (coronary artery disease) 07/30/2017  . Dumping syndrome   . Dyspnea   . Dysrhythmia    afib  . 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 Family history of adverse reaction to anesthesia    sister [name]  . GERD (gastroesophageal reflux disease)   . Headache    MIGRAINES  . Hypertension   . Lung disease   . Myocardial  infarction ([place]) 2007   Non-STEMI  . [name] (postoperative nausea and vomiting)   . Psoriasis   . PUD (peptic ulcer disease)     Assessment/Plan:   11 Days Post-Op Procedure(s) (LRB): INTRAMEDULLARY (IM) NAIL INTERTROCHANTRIC (Right) Active Problems:   Closed right hip fracture (HCC)   Acute post op blood loss anemia   Estimated body mass index is 17.47 kg/m as calculated from the following:   Height as of this encounter: 5\' 5"  (1.651 m).   Weight as of this encounter: 47.6 kg. Advance diet Up with therapy  Acute post op blood loss anemia - Hgb 8.9 stable. Pain well controlled CM to assist with discharge to SNF  Remove staples and apply steri strips on 10/14/2018 Follow up with [facility] orthopedics in 6 weeks TED hose BLE x 6 weeks, remove at night time  DVT Prophylaxis - TED hose and SCDs, Eliquis Weight-Bearing as tolerated to right leg   Reche Dixon, PA-C [place] 10/11/2018, 7:18 AM

## 2018-10-11 NOTE — Progress Notes (Signed)
Physical Therapy Treatment Patient Details Name: Ana Washington MRN: [id number] DOB: 10/27/1945 Today's Date: 10/11/2018    History of Present Illness Pt is a 73 year old female admitted s/p I.M. nailing of R hip following a fall in her home.  Pt tripped over her O2 tubing.  PMH includes T10 kyphoplasty on 04/08/2018, atrial fibrillation, arthritis and anxiety.    PT Comments    Patient up in chair upon arrival to room (per nursing), goal to focus treatment session on gait progression. Able to complete sit/stand with RW, mod assist and initiate forward gait progression x2-3 steps with RW, mod assist.  Was able to take more formal step this date (vs scooting/sliding noted previously), but with consistent posterior trunk lean/weight shift and delayed/absent self-righting reactions.  Unsafe to attempt without hands-on assist at all times. Additional mobility progression limited by toileting needs-incontinent bladder during gait, bowel movement upon return to supine.    Follow Up Recommendations  SNF     Equipment Recommendations       Recommendations for Other Services       Precautions / Restrictions Precautions Precautions: Fall Precaution Comments: airborne/contact iso Restrictions Weight Bearing Restrictions: Yes RLE Weight Bearing: Weight bearing as tolerated    Mobility  Bed Mobility Overal bed mobility: Needs Assistance Bed Mobility: Sit to Supine       Sit to supine: Mod assist      Transfers Overall transfer level: Needs assistance Equipment used: Rolling walker (2 wheeled) Transfers: Sit to/from Stand Sit to Stand: Mod assist         General transfer comment: requires bilat UE support for lift off, balance an dpostural support; increased time for movement transition, generally guarded throughout  Ambulation/Gait Ambulation/Gait assistance: Mod assist Gait Distance (Feet): 2 Feet Assistive device: Rolling walker (2 wheeled)       General Gait  Details: step to gait pattern, fair WBing R LE; forward flexed posture with excessive posterior trunk lean/weight shift (constant assist to prevent posterior LOB).  Noted with bladder incontinence during gait trial; additional distance limited as result.   Stairs             Wheelchair Mobility    Modified Rankin (Stroke Patients Only)       Balance Overall balance assessment: Needs assistance Sitting-balance support: No upper extremity supported;Feet supported Sitting balance-Leahy Scale: Fair     Standing balance support: Bilateral upper extremity supported Standing balance-Leahy Scale: Poor Standing balance comment: excessive posterior weight shift                            Cognition Arousal/Alertness: Awake/alert Behavior During Therapy: WFL for tasks assessed/performed Overall Cognitive Status: Within Functional Limits for tasks assessed                                        Exercises Other Exercises Other Exercises: Sit/stand x3 with RW, mod assist +1 for lift off, balance Other Exercises: Rolling bilat, mod assist, for toileting/bowels; dep assist for hygiene and position of R LE    General Comments        Pertinent Vitals/Pain Pain Assessment: Faces Faces Pain Scale: Hurts even more Pain Location: R hip and knee Pain Descriptors / Indicators: Aching;Grimacing;Guarding Pain Intervention(s): Limited activity within patient's tolerance;Monitored during session;Repositioned    Home Living  Prior Function            PT Goals (current goals can now be found in the care plan section) Acute Rehab PT Goals Patient Stated Goal: To get back to being able to walk on her own. PT Goal Formulation: With patient Time For Goal Achievement: 10/15/18 Potential to Achieve Goals: Fair Progress towards PT goals: Progressing toward goals    Frequency    7X/week      PT Plan Current plan remains  appropriate    Co-evaluation              AM-PAC PT "6 Clicks" Mobility   Outcome Measure  Help needed turning from your back to your side while in a flat bed without using bedrails?: A Lot Help needed moving from lying on your back to sitting on the side of a flat bed without using bedrails?: A Lot Help needed moving to and from a bed to a chair (including a wheelchair)?: A Lot Help needed standing up from a chair using your arms (e.g., wheelchair or bedside chair)?: A Lot Help needed to walk in hospital room?: A Lot Help needed climbing 3-5 steps with a railing? : Total 6 Click Score: 11    End of Session Equipment Utilized During Treatment: Gait belt;Oxygen Activity Tolerance: Patient tolerated treatment well(limited by toileting needs) Patient left: in bed(EMS present for discharge) Nurse Communication: Mobility status PT Visit Diagnosis: Unsteadiness on feet (R26.81);Muscle weakness (generalized) (M62.81);History of falling (Z91.81);Pain Pain - Right/Left: Right Pain - part of body: Knee     Time: [id number] PT Time Calculation (min) (ACUTE ONLY): 41 min  Charges:  $Therapeutic Activity: 38-52 mins                     Mccoy Testa H. Owens Shark, PT, DPT, NCS 10/11/18, 4:46 PM [phone]

## 2018-10-11 NOTE — Progress Notes (Signed)
Discharge report called to [facility] verbalized an understanding/ iv and tele removed/ EMS called to transport

## 2018-10-11 NOTE — TOC Transition Note (Signed)
Transition of Care [facility]) - CM/SW Discharge Note   Patient Details  Name: Ana Washington MRN: [id number] Date of Birth: [birth year]  Transition of Care [facility]) CM/SW Contact:  Elza Rafter, RN Phone Number: 10/11/2018, 3:08 PM   Clinical Narrative:   Patient discharging via EMS to [facility].  Left Fritz Pickerel, patients spouse a message of patients discharge.        Barriers to Discharge: Continued Medical Work up   Patient Goals and CMS Choice Patient states their goals for this hospitalization and ongoing recovery are:: To go home.      Discharge Placement                       Discharge Plan and Services In-house Referral: Clinical Social Work Discharge Planning Services: CM Consult                                 Social Determinants of Health (SDOH) Interventions     Readmission Risk Interventions Readmission Risk Prevention Plan 01/06/2018  Transportation Screening Complete  PCP or Specialist Appt within 5-7 Days Complete  Home Care Screening Complete  Medication Review (RN CM) Complete  Some recent data might be hidden

## 2018-11-15 ENCOUNTER — Other Ambulatory Visit: Payer: Self-pay | Admitting: Internal Medicine

## 2018-12-17 ENCOUNTER — Encounter: Payer: Self-pay | Admitting: Surgery

## 2018-12-17 ENCOUNTER — Telehealth: Payer: Self-pay

## 2018-12-17 ENCOUNTER — Ambulatory Visit: Payer: Medicare HMO | Admitting: Surgery

## 2018-12-17 ENCOUNTER — Other Ambulatory Visit: Payer: Self-pay

## 2018-12-17 VITALS — BP 155/82 | HR 76 | Temp 97.3°F | Resp 22 | Ht 65.0 in | Wt 110.0 lb

## 2018-12-17 DIAGNOSIS — K449 Diaphragmatic hernia without obstruction or gangrene: Secondary | ICD-10-CM

## 2018-12-17 NOTE — Telephone Encounter (Signed)
Barium Swallow study- 12/19/2018 @ 9:15, nothing by mouth 4 hours prior.  Referral to GI-DR.Anna-EGD CT abdomen/pelvis 12/25/2018 @ 2:45 pm. Nothing by mouth 4 hours prior.   Dr.Pabon 01/21/2019 @ 10 am.  Patient to pick up prep kit for CT after she has barium swallow study done.  CT Scheduled @    Outpatient imaging [place], [id number]  Discussed with patient to have all test including seeing DR.Anna prior to appointment with Dr.Pabon.

## 2018-12-17 NOTE — Patient Instructions (Signed)
I will call you later today with the appointment information.

## 2018-12-18 ENCOUNTER — Encounter: Payer: Self-pay | Admitting: *Deleted

## 2018-12-18 ENCOUNTER — Encounter: Payer: Self-pay | Admitting: Surgery

## 2018-12-18 NOTE — Progress Notes (Signed)
Patient ID: [name], female   DOB: Dec 19, 1945, 73 y.o.   MRN: [id number]  HPI Ana Washington is a 73 y.o. female seen in consultation at the request of Dr. Lanney Gins for a giant paraesophageal hernia repair.  She does have a history of atrial fibrillation on anticoagulation, coronary artery disease, bronchiectasis with MAC.  She also had a history of bilateral thoracotomies with bilateral partial lung resections due to [place]. Mentally she is pristine.  She is able to make her own decisions.  She does have dyspnea on exertion and worse home oxygen 24/7.  She does have significant GERD symptoms with acid reflux.  She also reports that she takes inhalers and she takes a PPI with some relief but not complete relief of her reflux symptoms. 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  There is no evidence of volvulus.  She did have CMP showing that everything except is normal except the chronic hypercapnia  HPI  Past Medical History:  Diagnosis Date  . Arthritis   . Atrial fibrillation ([place])   . Bronchiectasis ([place])   . CAD (coronary artery disease) 07/30/2017  . Dumping syndrome   . Dyspnea   . Dysrhythmia    afib  . 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 Family history of adverse reaction to anesthesia    sister [name]  . GERD (gastroesophageal reflux disease)   . Headache    MIGRAINES  . Hypertension   . Lung disease   . Myocardial infarction ([place]) 2007   Non-STEMI  . [name] (postoperative nausea and vomiting)   . Psoriasis   . PUD (peptic ulcer disease)     Past Surgical History:  Procedure Laterality Date  . BACK SURGERY    . CHOLECYSTECTOMY    . EYE SURGERY    . FOOT SURGERY    . INTRAMEDULLARY (IM) NAIL INTERTROCHANTERIC Right 09/30/2018   Procedure: INTRAMEDULLARY (IM) NAIL INTERTROCHANTRIC;  Surgeon: Dereck Leep, MD;  Location: [facility];  Service: Orthopedics;  Laterality: Right;  .  KYPHOPLASTY N/A 07/05/2016   Procedure: KYPHOPLASTY T - 9;  Surgeon: Hessie Knows, MD;  Location: [facility];  Service: Orthopedics;  Laterality: N/A;  . KYPHOPLASTY N/A 11/29/2017   Procedure: Iona Hansen;  Surgeon: Hessie Knows, MD;  Location: [facility];  Service: Orthopedics;  Laterality: N/A;  L2 and L3  . KYPHOPLASTY N/A 12/18/2017   Procedure: KYPHOPLASTY L1;  Surgeon: Hessie Knows, MD;  Location: [facility];  Service: Orthopedics;  Laterality: N/A;  . KYPHOPLASTY N/A 01/05/2018   Procedure: KYPHOPLASTY-T11,T12;  Surgeon: Hessie Knows, MD;  Location: [facility];  Service: Orthopedics;  Laterality: N/A;  . KYPHOPLASTY N/A 04/05/2018   Procedure: T10 KYPHOPLASTY;  Surgeon: Hessie Knows, MD;  Location: [facility];  Service: Orthopedics;  Laterality: N/A;  . KYPHOPLASTY N/A 04/12/2018   Procedure: KYPHOPLASTY T7,8;  Surgeon: Hessie Knows, MD;  Location: [facility];  Service: Orthopedics;  Laterality: N/A;  . KYPHOPLASTY N/A 04/19/2018   Procedure: KYPHOPLASTY T5, T6;  Surgeon: Hessie Knows, MD;  Location: [facility];  Service: Orthopedics;  Laterality: N/A;  . LUNG SURGERY  1990 and 1996  . THOROCOTOMY WITH LOBECTOMY     LEFT LOWER THORACOTOMY / RIGHT MIDDLE LOBECTOMY    Family History  Problem Relation Age of Onset  . Hypertension Mother   . Hypertension Father     Social History Social History   Tobacco Use  . Smoking status: Never Smoker  .  Smokeless tobacco: Never Used  Substance Use Topics  . Alcohol use: No  . Drug use: No    Allergies  Allergen Reactions  . Sulfa Antibiotics Diarrhea  . Codeine Nausea And Vomiting  . Penicillins Rash    Has patient had a PCN reaction causing immediate rash, facial/tongue/throat swelling, SOB or lightheadedness with hypotension: Unknown Has patient had a PCN reaction causing severe rash involving mucus membranes or skin necrosis: Unknown Has patient had a PCN reaction that required hospitalization: Unknown Has patient had a PCN reaction  occurring within the last 10 years: No If all of the above answers are "NO", then may proceed with Cephalosporin use.     Current Outpatient Medications  Medication Sig Dispense Refill  . acetaminophen (TYLENOL) 500 MG tablet Take 1,000 mg by mouth every 6 (six) hours as needed for mild pain or headache.     [name] amLODipine (NORVASC) 5 MG tablet Take 5 mg by mouth daily.     [name] apixaban (ELIQUIS) 5 MG TABS tablet Take 5 mg by mouth 2 (two) times daily.     . Ascorbic Acid (VITAMIN C) 1000 MG tablet Take 1,000 mg by mouth daily.    [name] azithromycin (ZITHROMAX) 250 MG tablet Take 250 mg by mouth daily.     . Calcium Carbonate-Vitamin D (OYSTER SHELL/VITAMIN D) 600-125 MG-UNIT TABS Take 1 tablet by mouth daily.    . chlorpheniramine-HYDROcodone (TUSSIONEX) 10-8 MG/5ML SUER Take 5 mLs by mouth at bedtime.     . ciprofloxacin (CIPRO) 500 MG tablet Take 500 mg by mouth 2 (two) times daily.    [name] diltiazem (CARDIZEM CD) 180 MG 24 hr capsule Take 1 capsule (180 mg total) by mouth daily.    . ferrous sulfate 325 (65 FE) MG EC tablet Take 325 mg by mouth daily.     [name] gabapentin (NEURONTIN) 300 MG capsule Take 300 mg by mouth 4 (four) times daily.     [name] LORazepam (ATIVAN) 0.5 MG tablet Take 1 tablet (0.5 mg total) by mouth every 4 (four) hours. 30 tablet 0  . metoprolol succinate (TOPROL-XL) 100 MG 24 hr tablet Take 1 tablet (100 mg total) by mouth daily.    . potassium chloride (K-DUR) 10 MEQ tablet Take 10 mEq by mouth daily.    . Probiotic Product (ALIGN) 4 MG CAPS Take 4 mg by mouth daily.    . Sodium Chloride, Inhalant, 7 % NEBU Inhale 4 mLs into the lungs 3 (three) times daily.    . traMADol (ULTRAM) 50 MG tablet Take 1-2 tablets (50-100 mg total) by mouth every 4 (four) hours as needed for moderate pain. 30 tablet 0  . pantoprazole (PROTONIX) 40 MG tablet Take 1 tablet (40 mg total) by mouth daily. 30 tablet 1   No current facility-administered medications for this visit.      Review of  Systems Full ROS  was asked and was negative except for the information on the HPI  Physical Exam Blood pressure (!) 155/82, pulse 76, temperature (!) 97.3 F (36.3 C), temperature source Temporal, resp. rate (!) 22, height _0  (1.651 m), weight 110 lb (49.9 kg), SpO2 99 %. CONSTITUTIONAL: Debilitated elderly female with Dyspnea and unable to finish a full sentence without breaking for air EYES: Pupils are equal, round, and reactive to light, Sclera are non-icteric. EARS, NOSE, MOUTH AND THROAT: The oropharynx is clear. The oral mucosa is pink and moist. Hearing is intact to voice. LYMPH NODES:  Lymph nodes in the  neck are normal. RESPIRATORY:  Lungs are clear. There is normal respiratory effort, with equal breath sounds bilaterally, and without pathologic use of accessory muscles. There are bowel sound within the left chest CARDIOVASCULAR: Heart is regular without murmurs, gallops, or rubs. GI: The abdomen is  soft, nontender, and nondistended. There are no palpable masses. There is no hepatosplenomegaly. There are normal bowel sounds in all quadrants. GU: Rectal deferred.   MUSCULOSKELETAL: Normal muscle strength and tone. No cyanosis or edema.   SKIN: Turgor is good and there are no pathologic skin lesions or ulcers. NEUROLOGIC: Motor and sensation is grossly normal. Cranial nerves are grossly intact. PSYCH:  Oriented to person, place and time. Affect is normal.  Data Reviewed  I have personally reviewed the patient's imaging, laboratory findings and medical records.    Assessment/Plan 73 year old female with significant medical issues including severe bronchiectasis with chronic hypoxemia requiring home oxygen.  She does have a giant type IV paraesophageal hernia causing significant compression on the left lower lobe and contributing to her overall ventilatory, hypoxia  and pulmonary issues. This is definitely a complex scenario.  In theory paraesophageal hernia repair will improve her  ventilatory and hypoxemia.  However she does have significant comorbidities that puts her at a moderate to severe risk for perioperative morbidity and mortality. With that being said I have discussed my thoughts with the patient and the family in detail.  For now I will start work-up to include a repeat CT scan of the chest and abdomen, barium swallow to evaluate for the esophageal function and will also request an EGD to evaluate the intraluminal aspect of the esophagus and the stomach. After we obtain appropriate work-up will have another serious discussion with patient regarding potential surgical intervention.  Again this is a complex decision and the patient has to be involved and be very aware of potential serious complications.  There is no need for emergent surgical intervention at this time  She may also needs preoperative Cardiologic optimization but will wait until appropriate work-up is completed before taking that step  Time spent with the patient was 65 minutes, with more than 50% of the time spent in face-to-face education, counseling and care coordination.     Caroleen Hamman, MD FACS General Surgeon 12/18/2018, 10:08 AM

## 2018-12-19 ENCOUNTER — Telehealth: Payer: Self-pay | Admitting: *Deleted

## 2018-12-19 ENCOUNTER — Ambulatory Visit
Admission: RE | Admit: 2018-12-19 | Discharge: 2018-12-19 | Disposition: A | Discharge status: Home / Self Care | Payer: Medicare Other | Source: Ambulatory Visit | Attending: Surgery | Admitting: Surgery

## 2018-12-19 ENCOUNTER — Other Ambulatory Visit: Payer: Self-pay

## 2018-12-19 DIAGNOSIS — K449 Diaphragmatic hernia without obstruction or gangrene: Secondary | ICD-10-CM | POA: Insufficient documentation

## 2018-12-19 NOTE — Telephone Encounter (Signed)
Notified patient as instructed,swallow test showed a large hernia but nothing new  patient pleased. Discussed follow-up appointments, patient agrees

## 2018-12-25 ENCOUNTER — Other Ambulatory Visit: Payer: Self-pay

## 2018-12-25 ENCOUNTER — Ambulatory Visit
Admission: RE | Admit: 2018-12-25 | Discharge: 2018-12-25 | Disposition: A | Discharge status: Home / Self Care | Payer: Medicare Other | Source: Ambulatory Visit | Attending: Surgery | Admitting: Surgery

## 2018-12-25 DIAGNOSIS — K449 Diaphragmatic hernia without obstruction or gangrene: Secondary | ICD-10-CM | POA: Insufficient documentation

## 2018-12-25 LAB — POCT I-STAT CREATININE: Creatinine, Ser: 0.7 mg/dL (ref 0.44–1.00)

## 2018-12-25 MED ORDER — IOHEXOL 300 MG/ML  SOLN
75.0000 mL | Freq: Once | INTRAMUSCULAR | Status: AC | PRN
  Administered 2018-12-25: 75 mL via INTRAVENOUS

## 2018-12-31 ENCOUNTER — Telehealth: Payer: Self-pay

## 2018-12-31 NOTE — Telephone Encounter (Signed)
-----   Message from Jules Husbands, MD sent at 12/30/2018  9:40 AM EST ----- Please let her know the CT showed a hernia but nothing new, may see as previously scheduled ----- Message ----- From: Interface, Rad Results In Sent: 12/26/2018  12:01 AM EST To: Jules Husbands, MD

## 2018-12-31 NOTE — Telephone Encounter (Signed)
Left detailed message to notify patient of recent CT scan "showed a hernia but nothing new, may see as previously scheduled per Dr.Pabon." Also, advised patient to give our office a call if she has any questions or concerns.

## 2019-01-07 ENCOUNTER — Ambulatory Visit: Payer: Self-pay | Admitting: Surgery

## 2019-01-21 ENCOUNTER — Ambulatory Visit: Payer: Self-pay | Admitting: Surgery

## 2019-02-14 ENCOUNTER — Ambulatory Visit: Payer: Medicare HMO | Admitting: Gastroenterology

## 2019-02-14 ENCOUNTER — Other Ambulatory Visit: Payer: Self-pay

## 2019-02-14 VITALS — BP 108/74 | HR 89 | Temp 98.1°F | Ht 65.0 in | Wt 109.4 lb

## 2019-02-14 DIAGNOSIS — K449 Diaphragmatic hernia without obstruction or gangrene: Secondary | ICD-10-CM | POA: Diagnosis not present

## 2019-02-14 NOTE — Progress Notes (Signed)
[name] MD, MRCP(U.K) [address]  [place]  [place], [place] [address]  Main: [phone]  Fax: [phone]   Gastroenterology Consultation  Referring Provider:     Jules Husbands, MD Primary Care Physician:  [name], MD Primary Gastroenterologist:  Dr. [name]  Reason for Consultation:   Type IV paraesophageal hernia needing an EGD        HPI:   Ana Washington is a 74 y.o. y/o female referred by Dr. Dahlia Byes for an EGD to evaluate type IV paraesophageal hernia.  She has previously been seen by [facility] in 2018 for dysphagia.  Patient refused a barium swallow at that point and was not done.  She had a CT scan of the chest back in 09/20/2018 for dyspnea that demonstrated features of bronchiectasis in both lungs.  In November 2020 underwent a barium esophagogram with a tablet showed large hiatal hernia with the entirety of the stomach within the thorax.  Severe gastroesophageal reflux and no stricture noted.  #2020 underwent a CT scan of the abdomen which demonstrated a giant hiatal hernia type IV containing the entirety of the stomach as well as a portion of the colon and herniated omentum and mesentery.  Dilation of the common bile duct up to 13 mm in diameter greater than expected for typical reservoir effect without visible calcified intraductal stones or visible obstructing lesion.  She suffers from chronic hypoxemia requiring home oxygen.  It is felt that the esophageal hernia is causing significant compression of the left lower lobe and contributing to her overall hypoxia.  Past Medical History:  Diagnosis Date  . Arthritis   . Atrial fibrillation ([place])   . Bronchiectasis ([place])   . CAD (coronary artery disease) 07/30/2017  . Dumping syndrome   . Dyspnea   . Dysrhythmia    afib  . 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 Family history of adverse reaction to anesthesia    sister [name]  . GERD (gastroesophageal reflux disease)   . Headache    MIGRAINES    . Hypertension   . Lung disease   . Myocardial infarction ([place]) 2007   Non-STEMI  . [name] (postoperative nausea and vomiting)   . Psoriasis   . PUD (peptic ulcer disease)     Past Surgical History:  Procedure Laterality Date  . BACK SURGERY    . CHOLECYSTECTOMY    . EYE SURGERY    . FOOT SURGERY    . INTRAMEDULLARY (IM) NAIL INTERTROCHANTERIC Right 09/30/2018   Procedure: INTRAMEDULLARY (IM) NAIL INTERTROCHANTRIC;  Surgeon: Dereck Leep, MD;  Location: [facility];  Service: Orthopedics;  Laterality: Right;  . KYPHOPLASTY N/A 07/05/2016   Procedure: KYPHOPLASTY T - 9;  Surgeon: Hessie Knows, MD;  Location: [facility];  Service: Orthopedics;  Laterality: N/A;  . KYPHOPLASTY N/A 11/29/2017   Procedure: Iona Hansen;  Surgeon: Hessie Knows, MD;  Location: [facility];  Service: Orthopedics;  Laterality: N/A;  L2 and L3  . KYPHOPLASTY N/A 12/18/2017   Procedure: KYPHOPLASTY L1;  Surgeon: Hessie Knows, MD;  Location: [facility];  Service: Orthopedics;  Laterality: N/A;  . KYPHOPLASTY N/A 01/05/2018   Procedure: KYPHOPLASTY-T11,T12;  Surgeon: Hessie Knows, MD;  Location: [facility];  Service: Orthopedics;  Laterality: N/A;  . KYPHOPLASTY N/A 04/05/2018   Procedure: T10 KYPHOPLASTY;  Surgeon: Hessie Knows, MD;  Location: [facility];  Service: Orthopedics;  Laterality: N/A;  . KYPHOPLASTY N/A 04/12/2018   Procedure: KYPHOPLASTY T7,8;  Surgeon: Hessie Knows, MD;  Location:  [facility];  Service: Orthopedics;  Laterality: N/A;  . KYPHOPLASTY N/A 04/19/2018   Procedure: KYPHOPLASTY T5, T6;  Surgeon: Hessie Knows, MD;  Location: [facility];  Service: Orthopedics;  Laterality: N/A;  . LUNG SURGERY  1990 and 1996  . THOROCOTOMY WITH LOBECTOMY     LEFT LOWER THORACOTOMY / RIGHT MIDDLE LOBECTOMY    Prior to Admission medications   Medication Sig Start Date End Date Taking? Authorizing Provider  acetaminophen (TYLENOL) 500 MG tablet Take 1,000 mg by mouth every 6 (six) hours as needed for mild pain or  headache.     [provider]  amLODipine (NORVASC) 5 MG tablet Take 5 mg by mouth daily.  11/04/13   [provider]  apixaban (ELIQUIS) 5 MG TABS tablet Take 5 mg by mouth 2 (two) times daily.     [provider]  Ascorbic Acid (VITAMIN C) 1000 MG tablet Take 1,000 mg by mouth daily.    [provider]  azithromycin (ZITHROMAX) 250 MG tablet Take 250 mg by mouth daily.     [provider]  Calcium Carbonate-Vitamin D (OYSTER SHELL/VITAMIN D) 600-125 MG-UNIT TABS Take 1 tablet by mouth daily. 11/05/07   [provider]  chlorpheniramine-HYDROcodone (TUSSIONEX) 10-8 MG/5ML SUER Take 5 mLs by mouth at bedtime.  04/24/18   [provider]  ciprofloxacin (CIPRO) 500 MG tablet Take 500 mg by mouth 2 (two) times daily.    [provider]  diltiazem (CARDIZEM CD) 180 MG 24 hr capsule Take 1 capsule (180 mg total) by mouth daily. 10/11/18   Hillary Bow, MD  ferrous sulfate 325 (65 FE) MG EC tablet Take 325 mg by mouth daily.     [provider]  gabapentin (NEURONTIN) 300 MG capsule Take 300 mg by mouth 4 (four) times daily.     [provider]  LORazepam (ATIVAN) 0.5 MG tablet Take 1 tablet (0.5 mg total) by mouth every 4 (four) hours. 10/07/18   Bettey Costa, MD  metoprolol succinate (TOPROL-XL) 100 MG 24 hr tablet Take 1 tablet (100 mg total) by mouth daily. 10/11/18   Hillary Bow, MD  pantoprazole (PROTONIX) 40 MG tablet Take 1 tablet (40 mg total) by mouth daily. 04/15/18 06/14/18  Henreitta Leber, MD  potassium chloride (K-DUR) 10 MEQ tablet Take 10 mEq by mouth daily.    [provider]  Probiotic Product (ALIGN) 4 MG CAPS Take 4 mg by mouth daily.    [provider]  Sodium Chloride, Inhalant, 7 % NEBU Inhale 4 mLs into the lungs 3 (three) times daily. 04/05/18   [provider]  traMADol (ULTRAM) 50 MG tablet Take 1-2 tablets (50-100 mg total) by mouth every 4 (four) hours as needed for  moderate pain. 10/06/18   Bettey Costa, MD    Family History  Problem Relation Age of Onset  . Hypertension Mother   . Hypertension Father      Social History   Tobacco Use  . Smoking status: Never Smoker  . Smokeless tobacco: Never Used  Substance Use Topics  . Alcohol use: No  . Drug use: No    Allergies as of 02/14/2019 - Review Complete 12/18/2018  Allergen Reaction Noted  . Sulfa antibiotics Diarrhea 03/01/2018  . Codeine Nausea And Vomiting 06/30/2016  . Penicillins Rash 10/03/2013    Review of Systems:    All systems reviewed and negative except where noted in HPI.   Physical Exam:  There were no vitals taken for this  visit. No LMP recorded. Patient is postmenopausal. Psych:  Alert and cooperative. Normal mood and affect. General:   Alert, appears very thin and cachectic.  In a wheelchair.  On oxygen.   Head:  Normocephalic and atraumatic. Mouth:  No deformity or lesions,oropharynx pink & moist. Neck: Using external neck muscles to aid with her respiration Lungs:  Respirations even.  Decreased air entry bilaterally.   No wheezes, crackles, or rhonchi. No acute distress. Heart:  Regular rate and rhythm; no murmurs, clicks, rubs, or gallops. Abdomen: Limited exam, but no gross tenderness. Neurologic:  Alert and oriented x3;  grossly normal neurologically. Skin:  Intact without significant lesions or rashes. No jaundice. Psych:  Alert and cooperative. Normal mood and affect.  Imaging Studies: No results found.  Assessment and Plan:   Ana Washington is a 74 y.o. y/o female has been referred for for an EGD to evaluate large paraesophageal hernia seen on imaging recently it is felt that the hernia is contributing to the patient's hypoxia due to compression of the lung.  Discussion with surgery with regards to possible surgery.  She is baseline at high risk for any anesthesia related procedure due to chronic hypoxemia and requiring oxygen.  I have been requested to  perform an EGD to evaluate the lumen.  Plan 1.  EGD: Very high risk procedure with underlying lungs and compression of the lungs with the hernia.  May need to be intubated for the procedure.  I discussed with anesthesia and they are willing to sedate for the procedure  I have discussed alternative options, risks & benefits,  which include, but are not limited to, bleeding, infection, perforation,respiratory complication & drug reaction.  The patient agrees with this plan & written consent will be obtained.     Follow up in 8 weeks   Dr [name] MD,MRCP(U.K)

## 2019-02-20 ENCOUNTER — Ambulatory Visit: Payer: Self-pay | Admitting: Surgery

## 2019-02-26 ENCOUNTER — Telehealth: Payer: Self-pay | Admitting: Gastroenterology

## 2019-02-26 NOTE — Telephone Encounter (Signed)
Pt left vm to find out when she is due for an apt with  Dr. Vicente Males or to talk to Dr. Georgeann Oppenheim nurse

## 2019-02-26 NOTE — Telephone Encounter (Signed)
Spoke with pt and informed her that we are still waiting for surgical clearance from her pulmonologist. Once we have received the clearance we will proceed with scheduling the upper endoscopy. I also informed pt that we've received the cardiac clearance and her cardiologist states pt is okay to hold her blood thinner (Eliquis) 2 days prior to the procedure. Pt understands and states she has an upcoming appointment with her pulmonologist next week.

## 2019-03-04 ENCOUNTER — Ambulatory Visit: Payer: Self-pay | Admitting: Surgery

## 2019-03-08 ENCOUNTER — Other Ambulatory Visit: Payer: Self-pay

## 2019-03-08 DIAGNOSIS — K449 Diaphragmatic hernia without obstruction or gangrene: Secondary | ICD-10-CM

## 2019-03-08 NOTE — Telephone Encounter (Signed)
Spoke with pt and informed her that we have received her pulmonary clearance and can proceed with scheduling procedure. Pt agrees and has been scheduled for 03-19-19. Pt is aware she'll need to hold the Eliquis 2 days prior to procedure. Pt agrees to receiving procedure instructions via MyChart.

## 2019-03-15 ENCOUNTER — Other Ambulatory Visit: Payer: Self-pay

## 2019-03-15 ENCOUNTER — Other Ambulatory Visit
Admission: RE | Admit: 2019-03-15 | Discharge: 2019-03-15 | Disposition: A | Discharge status: Home / Self Care | Payer: Medicare HMO | Source: Ambulatory Visit | Attending: Gastroenterology | Admitting: Gastroenterology

## 2019-03-15 DIAGNOSIS — Z01812 Encounter for preprocedural laboratory examination: Secondary | ICD-10-CM | POA: Diagnosis present

## 2019-03-15 DIAGNOSIS — Z20822 Contact with and (suspected) exposure to covid-19: Secondary | ICD-10-CM | POA: Diagnosis not present

## 2019-03-16 LAB — SARS CORONAVIRUS 2 (TAT 6-24 HRS): SARS Coronavirus 2: NEGATIVE

## 2019-03-18 ENCOUNTER — Encounter: Payer: Self-pay | Admitting: Gastroenterology

## 2019-03-19 ENCOUNTER — Ambulatory Visit: Payer: Medicare HMO | Admitting: Anesthesiology

## 2019-03-19 ENCOUNTER — Encounter: Payer: Self-pay | Admitting: Gastroenterology

## 2019-03-19 ENCOUNTER — Encounter
Admission: RE | Disposition: A | Discharge status: Home / Self Care | Payer: Self-pay | Source: Home / Self Care | Attending: Gastroenterology

## 2019-03-19 ENCOUNTER — Other Ambulatory Visit: Payer: Self-pay

## 2019-03-19 ENCOUNTER — Ambulatory Visit
Admission: RE | Admit: 2019-03-19 | Discharge: 2019-03-19 | Disposition: A | Discharge status: Home / Self Care | Payer: Medicare HMO | Attending: Gastroenterology | Admitting: Gastroenterology

## 2019-03-19 DIAGNOSIS — J449 Chronic obstructive pulmonary disease, unspecified: Secondary | ICD-10-CM | POA: Insufficient documentation

## 2019-03-19 DIAGNOSIS — Z7901 Long term (current) use of anticoagulants: Secondary | ICD-10-CM | POA: Insufficient documentation

## 2019-03-19 DIAGNOSIS — Z885 Allergy status to narcotic agent status: Secondary | ICD-10-CM | POA: Insufficient documentation

## 2019-03-19 DIAGNOSIS — Z01818 Encounter for other preprocedural examination: Secondary | ICD-10-CM | POA: Diagnosis not present

## 2019-03-19 DIAGNOSIS — M1990 Unspecified osteoarthritis, unspecified site: Secondary | ICD-10-CM | POA: Insufficient documentation

## 2019-03-19 DIAGNOSIS — K449 Diaphragmatic hernia without obstruction or gangrene: Secondary | ICD-10-CM | POA: Insufficient documentation

## 2019-03-19 DIAGNOSIS — K5710 Diverticulosis of small intestine without perforation or abscess without bleeding: Secondary | ICD-10-CM | POA: Insufficient documentation

## 2019-03-19 DIAGNOSIS — Z792 Long term (current) use of antibiotics: Secondary | ICD-10-CM | POA: Insufficient documentation

## 2019-03-19 DIAGNOSIS — Z880 Allergy status to penicillin: Secondary | ICD-10-CM | POA: Diagnosis not present

## 2019-03-19 DIAGNOSIS — I2510 Atherosclerotic heart disease of native coronary artery without angina pectoris: Secondary | ICD-10-CM | POA: Insufficient documentation

## 2019-03-19 DIAGNOSIS — I4891 Unspecified atrial fibrillation: Secondary | ICD-10-CM | POA: Diagnosis not present

## 2019-03-19 DIAGNOSIS — I252 Old myocardial infarction: Secondary | ICD-10-CM | POA: Insufficient documentation

## 2019-03-19 DIAGNOSIS — Z882 Allergy status to sulfonamides status: Secondary | ICD-10-CM | POA: Insufficient documentation

## 2019-03-19 DIAGNOSIS — I10 Essential (primary) hypertension: Secondary | ICD-10-CM | POA: Diagnosis not present

## 2019-03-19 DIAGNOSIS — K219 Gastro-esophageal reflux disease without esophagitis: Secondary | ICD-10-CM | POA: Diagnosis not present

## 2019-03-19 DIAGNOSIS — Z9981 Dependence on supplemental oxygen: Secondary | ICD-10-CM | POA: Diagnosis not present

## 2019-03-19 HISTORY — PX: ESOPHAGOGASTRODUODENOSCOPY (EGD) WITH PROPOFOL: SHX5813

## 2019-03-19 SURGERY — ESOPHAGOGASTRODUODENOSCOPY (EGD) WITH PROPOFOL
Anesthesia: General

## 2019-03-19 MED ORDER — PROPOFOL 500 MG/50ML IV EMUL
INTRAVENOUS | Status: DC | PRN
  Administered 2019-03-19: 100 ug/kg/min via INTRAVENOUS

## 2019-03-19 MED ORDER — LIDOCAINE HCL (PF) 2 % IJ SOLN
INTRAMUSCULAR | Status: DC | PRN
  Administered 2019-03-19: 100 mg via INTRADERMAL

## 2019-03-19 MED ORDER — PROPOFOL 500 MG/50ML IV EMUL
INTRAVENOUS | Status: AC
  Filled 2019-03-19: qty 50

## 2019-03-19 MED ORDER — PHENYLEPHRINE HCL (PRESSORS) 10 MG/ML IV SOLN
INTRAVENOUS | Status: DC | PRN
  Administered 2019-03-19: 100 ug via INTRAVENOUS

## 2019-03-19 MED ORDER — PROPOFOL 10 MG/ML IV BOLUS
INTRAVENOUS | Status: DC | PRN
  Administered 2019-03-19: 30 mg via INTRAVENOUS

## 2019-03-19 MED ORDER — SODIUM CHLORIDE 0.9 % IV SOLN
INTRAVENOUS | Status: DC
  Administered 2019-03-19: 1000 mL via INTRAVENOUS

## 2019-03-19 NOTE — Op Note (Signed)
[facility] Patient Name: Ana Washington Procedure Date: 03/19/2019 8:12 AM MRN: [id number] Account #: [id number] Date of Birth: Sep 10, 1945 Admit Type: Outpatient Age: 74 Room: [facility] ENDO ROOM 4 Gender: Female Note Status: Finalized Procedure:             Upper GI endoscopy Indications:           Preoperative assessment Providers:             [name] MD, MD Referring MD:          Irven Easterly. Kary Kos, MD (Referring MD) Medicines:             Monitored Anesthesia Care Complications:         No immediate complications. Procedure:             Pre-Anesthesia Assessment:                        - Prior to the procedure, a History and Physical was                         performed, and patient medications, allergies and                         sensitivities were reviewed. The patient's tolerance                         of previous anesthesia was reviewed.                        - The risks and benefits of the procedure and the                         sedation options and risks were discussed with the                         patient. All questions were answered and informed                         consent was obtained.                        - ASA Grade Assessment: III - A patient with severe                         systemic disease.                        After obtaining informed consent, the endoscope was                         passed under direct vision. Throughout the procedure,                         the patient's blood pressure, pulse, and oxygen                         saturations were monitored continuously. The Endoscope                         was introduced through the mouth, and  advanced to the                         third part of duodenum. The upper GI endoscopy was                         accomplished with ease. The patient tolerated the                         procedure well. Findings:      The esophagus was normal.      A large hiatal  hernia was present.      A large non-bleeding diverticulum was found in the first portion of the       duodenum.      The cardia and gastric fundus were normal on retroflexion. Impression:            - Normal esophagus.                        - Large hiatal hernia.                        - Non-bleeding duodenal diverticulum.                        - No specimens collected. Recommendation:        - Discharge patient to home (with escort).                        - Resume previous diet.                        - Continue present medications.                        - Return to my office PRN. Procedure Code(s):     --- Professional ---                        [phone], Esophagogastroduodenoscopy, flexible,                         transoral; diagnostic, including collection of                         specimen(s) by brushing or washing, when performed                         (separate procedure) Diagnosis Code(s):     --- Professional ---                        K44.9, Diaphragmatic hernia without obstruction or                         gangrene                        Z01.818, Encounter for other preprocedural examination                        K57.10, Diverticulosis of small intestine without  perforation or abscess without bleeding CPT copyright 2019 [employer]. All rights reserved. The codes documented in this report are preliminary and upon coder review may  be revised to meet current compliance requirements. [name], MD [name] MD, MD 03/19/2019 8:26:26 AM This report has been signed electronically. Number of Addenda: 0 Note Initiated On: 03/19/2019 8:12 AM Estimated Blood Loss:  Estimated blood loss: none.      [facility]

## 2019-03-19 NOTE — Anesthesia Procedure Notes (Signed)
Date/Time: 03/19/2019 8:04 AM Performed by: Nelda Marseille, CRNA Pre-anesthesia Checklist: Patient identified, Emergency Drugs available, Suction available, Patient being monitored and Timeout performed Oxygen Delivery Method: Nasal cannula

## 2019-03-19 NOTE — H&P (Signed)
[name], MD [address] [place], [place], [place], [address] [address], [place], [place], [place], [address] Phone: [phone]  Fax: [phone]  Primary Care Physician:  [name], MD   Pre-Procedure History & Physical: HPI:  Ana Washington is a 74 y.o. female is here for an endoscopy    Past Medical History:  Diagnosis Date  . Arthritis   . Atrial fibrillation ([place])   . Bronchiectasis ([place])   . CAD (coronary artery disease) 07/30/2017  . Dumping syndrome   . Dyspnea   . Dysrhythmia    afib  . 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 Family history of adverse reaction to anesthesia    sister [name]  . GERD (gastroesophageal reflux disease)   . Headache    MIGRAINES  . Hypertension   . Lung disease   . Myocardial infarction ([facility]) 2007   Non-STEMI  . [name] (postoperative nausea and vomiting)   . Psoriasis   . PUD (peptic ulcer disease)     Past Surgical History:  Procedure Laterality Date  . BACK SURGERY    . CHOLECYSTECTOMY    . EYE SURGERY    . FOOT SURGERY    . INTRAMEDULLARY (IM) NAIL INTERTROCHANTERIC Right 09/30/2018   Procedure: INTRAMEDULLARY (IM) NAIL INTERTROCHANTRIC;  Surgeon: Dereck Leep, MD;  Location: [facility];  Service: Orthopedics;  Laterality: Right;  . KYPHOPLASTY N/A 07/05/2016   Procedure: KYPHOPLASTY T - 9;  Surgeon: Hessie Knows, MD;  Location: [facility];  Service: Orthopedics;  Laterality: N/A;  . KYPHOPLASTY N/A 11/29/2017   Procedure: Iona Hansen;  Surgeon: Hessie Knows, MD;  Location: [facility];  Service: Orthopedics;  Laterality: N/A;  L2 and L3  . KYPHOPLASTY N/A 12/18/2017   Procedure: KYPHOPLASTY L1;  Surgeon: Hessie Knows, MD;  Location: [facility];  Service: Orthopedics;  Laterality: N/A;  . KYPHOPLASTY N/A 01/05/2018   Procedure: KYPHOPLASTY-T11,T12;  Surgeon: Hessie Knows, MD;  Location: [facility];  Service: Orthopedics;  Laterality: N/A;  . KYPHOPLASTY N/A 04/05/2018   Procedure: T10 KYPHOPLASTY;   Surgeon: Hessie Knows, MD;  Location: [facility];  Service: Orthopedics;  Laterality: N/A;  . KYPHOPLASTY N/A 04/12/2018   Procedure: KYPHOPLASTY T7,8;  Surgeon: Hessie Knows, MD;  Location: [facility];  Service: Orthopedics;  Laterality: N/A;  . KYPHOPLASTY N/A 04/19/2018   Procedure: KYPHOPLASTY T5, T6;  Surgeon: Hessie Knows, MD;  Location: [facility];  Service: Orthopedics;  Laterality: N/A;  . LUNG SURGERY  1990 and 1996  . THOROCOTOMY WITH LOBECTOMY     LEFT LOWER THORACOTOMY / RIGHT MIDDLE LOBECTOMY    Prior to Admission medications   Medication Sig Start Date End Date Taking? Authorizing Provider  acetaminophen (TYLENOL) 500 MG tablet Take 1,000 mg by mouth every 6 (six) hours as needed for mild pain or headache.    Yes [provider]  amLODipine (NORVASC) 5 MG tablet Take 5 mg by mouth daily.  11/04/13  Yes [provider]  apixaban (ELIQUIS) 5 MG TABS tablet Take 5 mg by mouth 2 (two) times daily.    Yes [provider]  Ascorbic Acid (VITAMIN C) 1000 MG tablet Take 1,000 mg by mouth daily.   Yes [provider]  azithromycin (ZITHROMAX) 250 MG tablet Take 250 mg by mouth daily.    Yes [provider]  Calcium Carbonate-Vitamin D (OYSTER SHELL/VITAMIN D) 600-125 MG-UNIT TABS Take 1 tablet by mouth daily. 11/05/07  Yes [provider]  chlorpheniramine-HYDROcodone ([place]) 10-8 MG/5ML SUER  Take 5 mLs by mouth at bedtime.  04/24/18  Yes [provider]  ciprofloxacin (CIPRO) 500 MG tablet Take 500 mg by mouth 2 (two) times daily.   Yes [provider]  diltiazem (CARDIZEM CD) 180 MG 24 hr capsule Take 1 capsule (180 mg total) by mouth daily. 10/11/18  Yes Sudini, Alveta Heimlich, MD  ferrous sulfate 325 (65 FE) MG EC tablet Take 325 mg by mouth daily.    Yes [provider]  gabapentin (NEURONTIN) 300 MG capsule Take 300 mg by mouth 4 (four) times daily.    Yes [provider]  LORazepam (ATIVAN) 0.5 MG tablet  Take 1 tablet (0.5 mg total) by mouth every 4 (four) hours. 10/07/18  Yes Bettey Costa, MD  metoprolol succinate (TOPROL-XL) 100 MG 24 hr tablet Take 1 tablet (100 mg total) by mouth daily. 10/11/18  Yes Sudini, Alveta Heimlich, MD  potassium chloride (K-DUR) 10 MEQ tablet Take 10 mEq by mouth daily.   Yes [provider]  Probiotic Product (ALIGN) 4 MG CAPS Take 4 mg by mouth daily.   Yes [provider]  Sodium Chloride, Inhalant, 7 % NEBU Inhale 4 mLs into the lungs 3 (three) times daily. 04/05/18  Yes [provider]  traMADol (ULTRAM) 50 MG tablet Take 1-2 tablets (50-100 mg total) by mouth every 4 (four) hours as needed for moderate pain. 10/06/18  Yes Mody, Ulice Bold, MD  pantoprazole (PROTONIX) 40 MG tablet Take 1 tablet (40 mg total) by mouth daily. 04/15/18 06/14/18  Henreitta Leber, MD    Allergies as of 03/08/2019 - Review Complete 02/14/2019  Allergen Reaction Noted  . Sulfa antibiotics Diarrhea 03/01/2018  . Codeine Nausea And Vomiting 06/30/2016  . Penicillins Rash 10/03/2013    Family History  Problem Relation Age of Onset  . Hypertension Mother   . Hypertension Father     Social History   Socioeconomic History  . Marital status: Married    Spouse name: Not on file  . Number of children: Not on file  . Years of education: Not on file  . Highest education level: Not on file  Occupational History  . Not on file  Tobacco Use  . Smoking status: Never Smoker  . Smokeless tobacco: Never Used  Substance and Sexual Activity  . Alcohol use: No  . Drug use: No  . Sexual activity: Not on file  Other Topics Concern  . Not on file  Social History Narrative  . Not on file   Social Determinants of Health   Financial Resource Strain:   . Difficulty of Paying Living Expenses: Not on file  Food Insecurity:   . Worried About Charity fundraiser in the Last Year: Not on file  . Ran Out of Food in the Last Year: Not on file  Transportation Needs:   . Lack of  Transportation (Medical): Not on file  . Lack of Transportation (Non-Medical): Not on file  Physical Activity:   . Days of Exercise per Week: Not on file  . Minutes of Exercise per Session: Not on file  Stress:   . Feeling of Stress : Not on file  Social Connections:   . Frequency of Communication with Friends and Family: Not on file  . Frequency of Social Gatherings with Friends and Family: Not on file  . Attends Religious Services: Not on file  . Active Member of Clubs or Organizations: Not on file  . Attends Archivist Meetings: Not on file  .  Marital Status: Not on file  Intimate Partner Violence:   . Fear of Current or Ex-Partner: Not on file  . Emotionally Abused: Not on file  . Physically Abused: Not on file  . Sexually Abused: Not on file    Review of Systems: See HPI, otherwise negative ROS  Physical Exam: BP (!) 162/102   Pulse 83   Temp (!) 97.3 F (36.3 C) (Temporal)   Resp (!) 22   Ht 5\' 5"  (1.651 m)   Wt 49.9 kg   SpO2 98%   BMI 18.30 kg/m  General:   Alert,  pleasant and cooperative in NAD Head:  Normocephalic and atraumatic. Neck:  Supple; no masses or thyromegaly. Lungs:  Clear throughout to auscultation, normal respiratory effort.    Heart:  +S1, +S2, Regular rate and rhythm, No edema. Abdomen:  Soft, nontender and nondistended. Normal bowel sounds, without guarding, and without rebound.   Neurologic:  Alert and  oriented x4;  grossly normal neurologically.  Impression/Plan: DASHIYA DAM is here for an endoscopy  to be performed for  evaluation of paraesophageal hernia    Risks, benefits, limitations, and alternatives regarding endoscopy have been reviewed with the patient.  Questions have been answered.  All parties agreeable.   [name], MD  03/19/2019, 8:12 AM

## 2019-03-19 NOTE — Anesthesia Postprocedure Evaluation (Signed)
Anesthesia Post Note  Patient: Ana Washington  Procedure(s) Performed: ESOPHAGOGASTRODUODENOSCOPY (EGD) WITH PROPOFOL (N/A )  Patient location during evaluation: Endoscopy Anesthesia Type: General Level of consciousness: awake and alert Pain management: pain level controlled Vital Signs Assessment: post-procedure vital signs reviewed and stable Respiratory status: spontaneous breathing, nonlabored ventilation, respiratory function stable and patient connected to nasal cannula oxygen Cardiovascular status: blood pressure returned to baseline and stable Postop Assessment: no apparent nausea or vomiting Anesthetic complications: no     Last Vitals:  Vitals:   03/19/19 0829 03/19/19 0839  BP:  (!) 163/98  Pulse:    Resp:    Temp: (!) 36.2 C   SpO2:      Last Pain:  Vitals:   03/19/19 0849  TempSrc:   PainSc: 0-No pain                 Martha Clan

## 2019-03-19 NOTE — Transfer of Care (Signed)
Immediate Anesthesia Transfer of Care Note  Patient: Ana Washington  Procedure(s) Performed: ESOPHAGOGASTRODUODENOSCOPY (EGD) WITH PROPOFOL (N/A )  Patient Location: PACU  Anesthesia Type:General  Level of Consciousness: awake, alert  and oriented  Airway & Oxygen Therapy: Patient Spontanous Breathing and Patient connected to nasal cannula oxygen  Post-op Assessment: Report given to RN and Post -op Vital signs reviewed and stable  Post vital signs: Reviewed and stable  Last Vitals:  Vitals Value Taken Time  BP 155/90 03/19/19 0829  Temp    Pulse 73 03/19/19 0828  Resp 22 03/19/19 0828  SpO2 100 % 03/19/19 0828  Vitals shown include unvalidated device data.  Last Pain:  Vitals:   03/19/19 0711  TempSrc: Temporal  PainSc: 0-No pain         Complications: No apparent anesthesia complications

## 2019-03-19 NOTE — Anesthesia Preprocedure Evaluation (Signed)
Anesthesia Evaluation  Patient identified by MRN, date of birth, ID band Patient awake    Reviewed: Allergy & Precautions, H&P , NPO status , Patient's Chart, lab work & pertinent test results  History of Anesthesia Complications (+) [name], Family history of anesthesia reaction and history of anesthetic complications  Airway Mallampati: III  TM Distance: <3 FB Neck ROM: limited    Dental  (+) Chipped, Upper Dentures, Poor Dentition, Missing   Pulmonary shortness of breath, with exertion and Long-Term Oxygen Therapy, pneumonia, unresolved, COPD,  COPD inhaler and oxygen dependent, neg recent URI,           Cardiovascular Exercise Tolerance: Poor hypertension, (-) angina+ CAD and + Past MI  (-) Cardiac Stents and (-) CABG + dysrhythmias Atrial Fibrillation      Neuro/Psych  Headaches, neg Seizures  Neuromuscular disease negative psych ROS   GI/Hepatic Neg liver ROS, PUD, GERD  Medicated and Controlled,  Endo/Other  negative endocrine ROS  Renal/GU      Musculoskeletal  (+) Arthritis ,   Abdominal   Peds  Hematology negative hematology ROS (+)   Anesthesia Other Findings Past Medical History: No date: Arthritis No date: Atrial fibrillation ([facility]) No date: Bronchiectasis ([facility]) No date: CAD (coronary artery disease) 07/30/2017: CAD (coronary artery disease) No date: Dumping syndrome No date: Dyspnea No date: Dysrhythmia     Comment:  afib 10/03/2013: Essential hypertension, malignant No date: Family history of adverse reaction to anesthesia     Comment:  sister [name] No date: GERD (gastroesophageal reflux disease) No date: Headache     Comment:  MIGRAINES No date: Hypertension No date: Lung disease 2007: Myocardial infarction ([place])     Comment:  Non-STEMI No date: [name] (postoperative nausea and vomiting) No date: Psoriasis No date: PUD (peptic ulcer disease)     Reproductive/Obstetrics negative OB ROS                              Anesthesia Physical  Anesthesia Plan  ASA: IV  Anesthesia Plan: General   Post-op Pain Management:    Induction: Intravenous  [name] Risk Score and Plan: 4 or greater and Propofol infusion and TIVA  Airway Management Planned: Natural Airway and Nasal Cannula  Additional Equipment:   Intra-op Plan:   Post-operative Plan:   Informed Consent: I have reviewed the patients History and Physical, chart, labs and discussed the procedure including the risks, benefits and alternatives for the proposed anesthesia with the patient or authorized representative who has indicated his/her understanding and acceptance.   Patient has DNR.  Discussed DNR with patient and Suspend DNR.   Dental Advisory Given  Plan Discussed with: Anesthesiologist, CRNA and Surgeon  Anesthesia Plan Comments: (With the patients pulmonary (O2 dependent at home and acute on chronic COPD exacerbation) and cardiac history she is at increased risk for complications from a general anesthetic.  Case has already been delayed 3 days to have patient meet ASRA anticoagulation guidelines for neuraxial anesthesia 2/2 her Apixaban use.  Patient is now at least 72 hours out from last dose of Apixaban and greater than 24 hours out from last dose of Lovenox.  Patient had low grade fever overnight.  Patient is afebrile when checked in the PACU before the procedure.  Elevated WBC count (23) on admission but labeled as chronic leokocytosis per internal medicine note, 15 this AM which seems to be around her baseline. Discussed increased risks of spinal with patient  in the setting of elevated temp and WBC thought to be 2/2 acute on chronic COPD exacerbation including but not limited to infection of lumbar spine and or intrathecal space.   Patient is already on antibiotics.   We feel that benefits of spinal outweigh risks of general anesthesia or waiting for further optimization since the patient  is a pulmonary cripple and will require restarting anticoagulation for her A Fib.   This was discussed with the patient voiced understanding. Patient was given the option to delay surgery for further optimization and she declined and wishes to proceed with the surgery under a spinal anesthetic.  Patient has cardiac and pulmonary clearance for this procedure.   Patient reports no bleeding problems.  Plan for spinal with backup GA  Patient consented for risks of anesthesia including but not limited to:  - adverse reactions to medications - risk of bleeding, infection, nerve damage and headache - risk of failed spinal - damage to teeth, lips or other oral mucosa - sore throat or hoarseness - Damage to heart, brain, lungs or loss of life  Patient voiced understanding.)        Anesthesia Quick Evaluation

## 2019-03-20 ENCOUNTER — Encounter: Payer: Self-pay | Admitting: *Deleted

## 2019-04-03 ENCOUNTER — Other Ambulatory Visit: Payer: Self-pay

## 2019-04-03 ENCOUNTER — Encounter: Payer: Self-pay | Admitting: Surgery

## 2019-04-03 ENCOUNTER — Ambulatory Visit: Payer: Medicare HMO | Admitting: Surgery

## 2019-04-03 VITALS — BP 145/91 | HR 87 | Temp 98.1°F | Resp 16 | Ht 65.0 in | Wt 115.2 lb

## 2019-04-03 DIAGNOSIS — K449 Diaphragmatic hernia without obstruction or gangrene: Secondary | ICD-10-CM

## 2019-04-03 NOTE — Patient Instructions (Signed)
We have placed a referral to [facility]. They will contact you to make an appointment. If you don't hear from there office please call us back.  Follow up as needed. Please call the office if you have any questions or concerns.

## 2019-04-05 ENCOUNTER — Telehealth: Payer: Self-pay | Admitting: Emergency Medicine

## 2019-04-05 NOTE — Telephone Encounter (Signed)
Referral to General Surgery to Dr Alisia Ferrari [phone]) sent at this time.

## 2019-04-05 NOTE — Progress Notes (Signed)
Outpatient Surgical Follow Up  04/05/2019  Ana Washington is an 74 y.o. female.   Chief Complaint  Patient presents with  . Follow-up    Follow up: Type 4 Paraesophgeal Hernia- discuss CT, Barium swallow    HPI: Ana Washington is a 74 year old female with a giant type IV paraesophageal hernia with significant swallowing issues and chest discomfort.  She does have significant bronchiectasis and is on home options 24/7.  She does have significant dyspnea.  She comes in a wheelchair today and is unable to finish long sentences.  She is very smaller and her mind is completely pristine.  She underwent a recent EGD showing evidence of a large hiatal hernia duodenal diverticulum.  Please note that I have personally reviewed the images. He has significant mobility issues  Past Medical History:  Diagnosis Date  . Arthritis   . Atrial fibrillation ([place])   . Bronchiectasis ([place])   . CAD (coronary artery disease) 07/30/2017  . Dumping syndrome   . Dyspnea   . Dysrhythmia    afib  . 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 Family history of adverse reaction to anesthesia    sister [name]  . GERD (gastroesophageal reflux disease)   . Headache    MIGRAINES  . Hypertension   . Lung disease   . Myocardial infarction ([place]) 2007   Non-STEMI  . [name] (postoperative nausea and vomiting)   . Psoriasis   . PUD (peptic ulcer disease)     Past Surgical History:  Procedure Laterality Date  . BACK SURGERY    . CHOLECYSTECTOMY    . ESOPHAGOGASTRODUODENOSCOPY (EGD) WITH PROPOFOL N/A 03/19/2019   Procedure: ESOPHAGOGASTRODUODENOSCOPY (EGD) WITH PROPOFOL;  Surgeon: [name], MD;  Location: [facility];  Service: Gastroenterology;  Laterality: N/A;  *Note to anesthesia: Per pt's pulmonologist, if intubating, please extubate to BIPAP.  [name] EYE SURGERY    . FOOT SURGERY    . INTRAMEDULLARY (IM) NAIL INTERTROCHANTERIC Right 09/30/2018   Procedure: INTRAMEDULLARY (IM) NAIL INTERTROCHANTRIC;  Surgeon:  Dereck Leep, MD;  Location: [facility];  Service: Orthopedics;  Laterality: Right;  . KYPHOPLASTY N/A 07/05/2016   Procedure: KYPHOPLASTY T - 9;  Surgeon: Hessie Knows, MD;  Location: [facility];  Service: Orthopedics;  Laterality: N/A;  . KYPHOPLASTY N/A 11/29/2017   Procedure: Iona Hansen;  Surgeon: Hessie Knows, MD;  Location: [facility];  Service: Orthopedics;  Laterality: N/A;  L2 and L3  . KYPHOPLASTY N/A 12/18/2017   Procedure: KYPHOPLASTY L1;  Surgeon: Hessie Knows, MD;  Location: [facility];  Service: Orthopedics;  Laterality: N/A;  . KYPHOPLASTY N/A 01/05/2018   Procedure: KYPHOPLASTY-T11,T12;  Surgeon: Hessie Knows, MD;  Location: [facility];  Service: Orthopedics;  Laterality: N/A;  . KYPHOPLASTY N/A 04/05/2018   Procedure: T10 KYPHOPLASTY;  Surgeon: Hessie Knows, MD;  Location: [facility];  Service: Orthopedics;  Laterality: N/A;  . KYPHOPLASTY N/A 04/12/2018   Procedure: KYPHOPLASTY T7,8;  Surgeon: Hessie Knows, MD;  Location: [facility];  Service: Orthopedics;  Laterality: N/A;  . KYPHOPLASTY N/A 04/19/2018   Procedure: KYPHOPLASTY T5, T6;  Surgeon: Hessie Knows, MD;  Location: [facility];  Service: Orthopedics;  Laterality: N/A;  . LUNG SURGERY  1990 and 1996  . THOROCOTOMY WITH LOBECTOMY     LEFT LOWER THORACOTOMY / RIGHT MIDDLE LOBECTOMY    Family History  Problem Relation Age of Onset  . Hypertension Mother   . Hypertension Father     Social History:  reports that she has never smoked. She has never used  smokeless tobacco. She reports that she does not drink alcohol or use drugs.  Allergies:  Allergies  Allergen Reactions  . Sulfa Antibiotics Diarrhea  . Codeine Nausea And Vomiting  . Penicillins Rash    Has patient had a PCN reaction causing immediate rash, facial/tongue/throat swelling, SOB or lightheadedness with hypotension: Unknown Has patient had a PCN reaction causing severe rash involving mucus membranes or skin necrosis: Unknown Has patient had a PCN reaction  that required hospitalization: Unknown Has patient had a PCN reaction occurring within the last 10 years: No If all of the above answers are "NO", then may proceed with Cephalosporin use.     Medications reviewed.    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  Discussed with the patient in detail.  This is a very difficult situation.  Her mind is pristine.  Personally I think that no matter what course of action she decides to go a day is fraught with significant risk.  Given her severe pulmonary issues I feel that she probably is better served her at a tertiary center.  I have discussed with her in detail about my thought process.  I am very hesitant about performing any type of procedure on her given her overall fragility and her severe pulmonary disease.  There is no need for emergent surgical intervention at this time. He wishes to go to [facility] and we will arrange for referral  Greater than 50% of the 40 minutes  visit was spent in counseling/coordination of care   Caroleen Hamman, MD [facility] Surgeon

## 2019-09-08 ENCOUNTER — Other Ambulatory Visit: Payer: Self-pay

## 2019-09-08 ENCOUNTER — Inpatient Hospital Stay
Admission: EM | Admit: 2019-09-08 | Discharge: 2019-09-10 | DRG: 190 | Disposition: A | Discharge status: Home Health | Payer: Medicare HMO | Attending: Hospitalist | Admitting: Hospitalist

## 2019-09-08 ENCOUNTER — Emergency Department: Payer: Medicare HMO

## 2019-09-08 DIAGNOSIS — E876 Hypokalemia: Secondary | ICD-10-CM | POA: Diagnosis present

## 2019-09-08 DIAGNOSIS — J189 Pneumonia, unspecified organism: Secondary | ICD-10-CM

## 2019-09-08 DIAGNOSIS — J9621 Acute and chronic respiratory failure with hypoxia: Secondary | ICD-10-CM | POA: Diagnosis present

## 2019-09-08 DIAGNOSIS — Z902 Acquired absence of lung [part of]: Secondary | ICD-10-CM

## 2019-09-08 DIAGNOSIS — I252 Old myocardial infarction: Secondary | ICD-10-CM

## 2019-09-08 DIAGNOSIS — Z79899 Other long term (current) drug therapy: Secondary | ICD-10-CM

## 2019-09-08 DIAGNOSIS — I482 Chronic atrial fibrillation, unspecified: Secondary | ICD-10-CM | POA: Diagnosis present

## 2019-09-08 DIAGNOSIS — J471 Bronchiectasis with (acute) exacerbation: Principal | ICD-10-CM

## 2019-09-08 DIAGNOSIS — F419 Anxiety disorder, unspecified: Secondary | ICD-10-CM | POA: Diagnosis present

## 2019-09-08 DIAGNOSIS — Z8249 Family history of ischemic heart disease and other diseases of the circulatory system: Secondary | ICD-10-CM

## 2019-09-08 DIAGNOSIS — I2510 Atherosclerotic heart disease of native coronary artery without angina pectoris: Secondary | ICD-10-CM | POA: Diagnosis present

## 2019-09-08 DIAGNOSIS — Z7901 Long term (current) use of anticoagulants: Secondary | ICD-10-CM

## 2019-09-08 DIAGNOSIS — E440 Moderate protein-calorie malnutrition: Secondary | ICD-10-CM | POA: Diagnosis present

## 2019-09-08 DIAGNOSIS — K219 Gastro-esophageal reflux disease without esophagitis: Secondary | ICD-10-CM | POA: Diagnosis present

## 2019-09-08 DIAGNOSIS — K449 Diaphragmatic hernia without obstruction or gangrene: Secondary | ICD-10-CM | POA: Diagnosis present

## 2019-09-08 DIAGNOSIS — I10 Essential (primary) hypertension: Secondary | ICD-10-CM | POA: Diagnosis present

## 2019-09-08 DIAGNOSIS — G894 Chronic pain syndrome: Secondary | ICD-10-CM | POA: Diagnosis present

## 2019-09-08 DIAGNOSIS — R309 Painful micturition, unspecified: Secondary | ICD-10-CM | POA: Diagnosis present

## 2019-09-08 DIAGNOSIS — E782 Mixed hyperlipidemia: Secondary | ICD-10-CM | POA: Diagnosis present

## 2019-09-08 DIAGNOSIS — J479 Bronchiectasis, uncomplicated: Secondary | ICD-10-CM

## 2019-09-08 DIAGNOSIS — Z7952 Long term (current) use of systemic steroids: Secondary | ICD-10-CM | POA: Diagnosis not present

## 2019-09-08 DIAGNOSIS — Z20822 Contact with and (suspected) exposure to covid-19: Secondary | ICD-10-CM | POA: Diagnosis present

## 2019-09-08 DIAGNOSIS — Z880 Allergy status to penicillin: Secondary | ICD-10-CM | POA: Diagnosis not present

## 2019-09-08 DIAGNOSIS — Z8711 Personal history of peptic ulcer disease: Secondary | ICD-10-CM | POA: Diagnosis not present

## 2019-09-08 DIAGNOSIS — Z681 Body mass index (BMI) 19 or less, adult: Secondary | ICD-10-CM | POA: Diagnosis not present

## 2019-09-08 DIAGNOSIS — Z9981 Dependence on supplemental oxygen: Secondary | ICD-10-CM | POA: Diagnosis not present

## 2019-09-08 DIAGNOSIS — A419 Sepsis, unspecified organism: Secondary | ICD-10-CM

## 2019-09-08 LAB — COMPREHENSIVE METABOLIC PANEL
ALT: 9 U/L (ref 0–44)
AST: 18 U/L (ref 15–41)
Albumin: 3.6 g/dL (ref 3.5–5.0)
Alkaline Phosphatase: 38 U/L (ref 38–126)
Anion gap: 9 (ref 5–15)
BUN: 11 mg/dL (ref 8–23)
CO2: 37 mmol/L — ABNORMAL HIGH (ref 22–32)
Calcium: 8.8 mg/dL — ABNORMAL LOW (ref 8.9–10.3)
Chloride: 92 mmol/L — ABNORMAL LOW (ref 98–111)
Creatinine, Ser: 0.65 mg/dL (ref 0.44–1.00)
GFR calc Af Amer: >60 mL/min (ref >60)
GFR calc non Af Amer: >60 mL/min (ref >60)
Glucose, Bld: 95 mg/dL (ref 70–99)
Potassium: 2.9 mmol/L — ABNORMAL LOW (ref 3.5–5.1)
Sodium: 138 mmol/L (ref 135–145)
Total Bilirubin: 0.8 mg/dL (ref 0.3–1.2)
Total Protein: 6.2 g/dL — ABNORMAL LOW (ref 6.5–8.1)

## 2019-09-08 LAB — BRAIN NATRIURETIC PEPTIDE: B Natriuretic Peptide: 101.3 pg/mL — ABNORMAL HIGH (ref 0.0–100.0)

## 2019-09-08 LAB — BLOOD GAS, VENOUS
Acid-Base Excess: 11 mmol/L — ABNORMAL HIGH (ref 0.0–2.0)
Bicarbonate: 38 mmol/L — ABNORMAL HIGH (ref 20.0–28.0)
O2 Saturation: 53.6 %
Patient temperature: 37
pCO2, Ven: 60 mmHg (ref 44.0–60.0)
pH, Ven: 7.41 (ref 7.250–7.430)
pO2, Ven: <31 mmHg — CL (ref 32.0–45.0)

## 2019-09-08 LAB — CBC WITH DIFFERENTIAL/PLATELET
Abs Immature Granulocytes: 0.08 10*3/uL — ABNORMAL HIGH (ref 0.00–0.07)
Basophils Absolute: 0.1 10*3/uL (ref 0.0–0.1)
Basophils Relative: 1 %
Eosinophils Absolute: 0.5 10*3/uL (ref 0.0–0.5)
Eosinophils Relative: 3 %
HCT: 38.3 % (ref 36.0–46.0)
Hemoglobin: 12.8 g/dL (ref 12.0–15.0)
Immature Granulocytes: 1 %
Lymphocytes Relative: 17 %
Lymphs Abs: 2.9 10*3/uL (ref 0.7–4.0)
MCH: 32.3 pg (ref 26.0–34.0)
MCHC: 33.4 g/dL (ref 30.0–36.0)
MCV: 96.7 fL (ref 80.0–100.0)
Monocytes Absolute: 1.4 10*3/uL — ABNORMAL HIGH (ref 0.1–1.0)
Monocytes Relative: 8 %
Neutro Abs: 12.4 10*3/uL — ABNORMAL HIGH (ref 1.7–7.7)
Neutrophils Relative %: 70 %
Platelets: 306 10*3/uL (ref 150–400)
RBC: 3.96 MIL/uL (ref 3.87–5.11)
RDW: 11.5 % (ref 11.5–15.5)
WBC: 17.5 10*3/uL — ABNORMAL HIGH (ref 4.0–10.5)
nRBC: 0 % (ref 0.0–0.2)

## 2019-09-08 LAB — MAGNESIUM: Magnesium: 1.5 mg/dL — ABNORMAL LOW (ref 1.7–2.4)

## 2019-09-08 LAB — TROPONIN I (HIGH SENSITIVITY): Troponin I (High Sensitivity): 19 ng/L — ABNORMAL HIGH (ref <18)

## 2019-09-08 LAB — PROCALCITONIN: Procalcitonin: <0.1 ng/mL

## 2019-09-08 LAB — FIBRIN DERIVATIVES D-DIMER (ARMC ONLY): Fibrin derivatives D-dimer (ARMC): 398.68 ng/mL (FEU) (ref 0.00–499.00)

## 2019-09-08 LAB — SARS CORONAVIRUS 2 BY RT PCR (HOSPITAL ORDER, PERFORMED IN ~~LOC~~ HOSPITAL LAB): SARS Coronavirus 2: NEGATIVE

## 2019-09-08 LAB — LACTIC ACID, PLASMA: Lactic Acid, Venous: 1 mmol/L (ref 0.5–1.9)

## 2019-09-08 MED ORDER — LACTATED RINGERS IV SOLN: INTRAVENOUS | Status: DC

## 2019-09-08 MED ORDER — LACTATED RINGERS IV BOLUS (SEPSIS): 500.0000 mL | Freq: Once | INTRAVENOUS | Status: DC

## 2019-09-08 MED ORDER — ACETYLCYSTEINE 20 % IN SOLN
600.0000 mg | Freq: Every day | RESPIRATORY_TRACT | Status: DC
  Administered 2019-09-09 – 2019-09-10 (×2): 600 mg via ORAL
  Filled 2019-09-08 (×3): qty 4

## 2019-09-08 MED ORDER — SODIUM CHLORIDE 0.9% FLUSH
3.0000 mL | Freq: Two times a day (BID) | INTRAVENOUS | Status: DC
  Administered 2019-09-08 – 2019-09-10 (×3): 3 mL via INTRAVENOUS

## 2019-09-08 MED ORDER — SODIUM CHLORIDE 0.9 % IV SOLN
1.0000 g | Freq: Once | INTRAVENOUS | Status: AC
  Administered 2019-09-08: 1 g via INTRAVENOUS
  Filled 2019-09-08: qty 1

## 2019-09-08 MED ORDER — RISAQUAD PO CAPS
1.0000 | ORAL_CAPSULE | Freq: Every day | ORAL | Status: DC
  Administered 2019-09-09 – 2019-09-10 (×2): 1 via ORAL
  Filled 2019-09-08 (×3): qty 1

## 2019-09-08 MED ORDER — LORAZEPAM 0.5 MG PO TABS
0.5000 mg | ORAL_TABLET | ORAL | Status: DC
  Administered 2019-09-08: 0.5 mg via ORAL
  Filled 2019-09-08 (×2): qty 1

## 2019-09-08 MED ORDER — DILTIAZEM HCL ER COATED BEADS 120 MG PO CP24
120.0000 mg | ORAL_CAPSULE | Freq: Every day | ORAL | Status: DC
  Administered 2019-09-09 – 2019-09-10 (×2): 120 mg via ORAL
  Filled 2019-09-08 (×2): qty 1

## 2019-09-08 MED ORDER — LACTATED RINGERS IV BOLUS (SEPSIS)
1000.0000 mL | Freq: Once | INTRAVENOUS | Status: AC
  Administered 2019-09-08: 1000 mL via INTRAVENOUS

## 2019-09-08 MED ORDER — VANCOMYCIN HCL IN DEXTROSE 1-5 GM/200ML-% IV SOLN
1000.0000 mg | Freq: Once | INTRAVENOUS | Status: AC
  Administered 2019-09-08: 1000 mg via INTRAVENOUS
  Filled 2019-09-08: qty 200

## 2019-09-08 MED ORDER — DEXTROSE 5 % IV SOLN: 7.0000 mg/kg | Freq: Two times a day (BID) | INTRAVENOUS | Status: DC

## 2019-09-08 MED ORDER — METHYLPREDNISOLONE SODIUM SUCC 125 MG IJ SOLR
60.0000 mg | Freq: Two times a day (BID) | INTRAMUSCULAR | Status: DC
  Administered 2019-09-08 – 2019-09-09 (×4): 60 mg via INTRAVENOUS
  Filled 2019-09-08 (×5): qty 2

## 2019-09-08 MED ORDER — HYDROCOD POLST-CPM POLST ER 10-8 MG/5ML PO SUER
5.0000 mL | Freq: Every day | ORAL | Status: DC
  Administered 2019-09-08 – 2019-09-09 (×2): 5 mL via ORAL
  Filled 2019-09-08 (×2): qty 5

## 2019-09-08 MED ORDER — SODIUM CHLORIDE 0.9 % IV SOLN: 2.0000 g | Freq: Two times a day (BID) | INTRAVENOUS | Status: DC

## 2019-09-08 MED ORDER — AMLODIPINE BESYLATE 5 MG PO TABS
5.0000 mg | ORAL_TABLET | Freq: Every day | ORAL | Status: DC
  Administered 2019-09-08 – 2019-09-10 (×3): 5 mg via ORAL
  Filled 2019-09-08 (×3): qty 1

## 2019-09-08 MED ORDER — METOPROLOL SUCCINATE ER 100 MG PO TB24
100.0000 mg | ORAL_TABLET | Freq: Every day | ORAL | Status: DC
  Administered 2019-09-09 – 2019-09-10 (×2): 100 mg via ORAL
  Filled 2019-09-08 (×2): qty 1

## 2019-09-08 MED ORDER — SODIUM CHLORIDE 0.9 % IV SOLN
2.0000 g | Freq: Two times a day (BID) | INTRAVENOUS | Status: DC
  Administered 2019-09-08: 2 g via INTRAVENOUS
  Filled 2019-09-08 (×3): qty 2

## 2019-09-08 MED ORDER — ONDANSETRON HCL 4 MG/2ML IJ SOLN
4.0000 mg | Freq: Once | INTRAMUSCULAR | Status: AC
  Administered 2019-09-08: 4 mg via INTRAVENOUS
  Filled 2019-09-08: qty 2

## 2019-09-08 MED ORDER — GABAPENTIN 300 MG PO CAPS
300.0000 mg | ORAL_CAPSULE | Freq: Four times a day (QID) | ORAL | Status: DC
  Administered 2019-09-08 – 2019-09-10 (×7): 300 mg via ORAL
  Filled 2019-09-08 (×8): qty 1

## 2019-09-08 MED ORDER — TRAMADOL HCL 50 MG PO TABS: 50.0000 mg | ORAL_TABLET | ORAL | Status: DC | PRN

## 2019-09-08 MED ORDER — LOSARTAN POTASSIUM 50 MG PO TABS
100.0000 mg | ORAL_TABLET | Freq: Every day | ORAL | Status: DC
  Administered 2019-09-09 – 2019-09-10 (×2): 100 mg via ORAL
  Filled 2019-09-08 (×3): qty 2

## 2019-09-08 MED ORDER — APIXABAN 5 MG PO TABS
5.0000 mg | ORAL_TABLET | Freq: Two times a day (BID) | ORAL | Status: DC
  Administered 2019-09-08 – 2019-09-10 (×5): 5 mg via ORAL
  Filled 2019-09-08 (×5): qty 1

## 2019-09-08 MED ORDER — ALBUTEROL SULFATE (2.5 MG/3ML) 0.083% IN NEBU
2.5000 mg | INHALATION_SOLUTION | RESPIRATORY_TRACT | Status: DC | PRN
  Administered 2019-09-09 (×2): 2.5 mg via RESPIRATORY_TRACT
  Filled 2019-09-08 (×2): qty 3

## 2019-09-08 MED ORDER — PANTOPRAZOLE SODIUM 40 MG IV SOLR
40.0000 mg | Freq: Two times a day (BID) | INTRAVENOUS | Status: DC
  Administered 2019-09-08 – 2019-09-10 (×4): 40 mg via INTRAVENOUS
  Filled 2019-09-08 (×4): qty 40

## 2019-09-08 MED ORDER — DILTIAZEM HCL ER COATED BEADS 180 MG PO CP24
180.0000 mg | ORAL_CAPSULE | Freq: Every day | ORAL | Status: DC
  Filled 2019-09-08 (×2): qty 1

## 2019-09-08 MED ORDER — SODIUM CHLORIDE 3 % IN NEBU
4.0000 mL | INHALATION_SOLUTION | Freq: Three times a day (TID) | RESPIRATORY_TRACT | Status: DC
  Administered 2019-09-08 – 2019-09-09 (×2): 4 mL via RESPIRATORY_TRACT
  Filled 2019-09-08 (×6): qty 4

## 2019-09-08 MED ORDER — PANTOPRAZOLE SODIUM 40 MG PO TBEC: 40.0000 mg | DELAYED_RELEASE_TABLET | Freq: Every day | ORAL | Status: DC

## 2019-09-08 MED ORDER — TOBRAMYCIN 300 MG/5ML IN NEBU
300.0000 mg | INHALATION_SOLUTION | Freq: Two times a day (BID) | RESPIRATORY_TRACT | Status: DC
  Administered 2019-09-09 – 2019-09-10 (×3): 300 mg via RESPIRATORY_TRACT
  Filled 2019-09-08 (×6): qty 5

## 2019-09-08 MED ORDER — POTASSIUM CHLORIDE CRYS ER 20 MEQ PO TBCR
40.0000 meq | EXTENDED_RELEASE_TABLET | Freq: Once | ORAL | Status: AC
  Administered 2019-09-08: 40 meq via ORAL
  Filled 2019-09-08: qty 2

## 2019-09-08 MED ORDER — GENTAMICIN SULFATE 40 MG/ML IJ SOLN
7.0000 mg/kg | Freq: Once | INTRAVENOUS | Status: AC
  Administered 2019-09-08: 350 mg via INTRAVENOUS
  Filled 2019-09-08: qty 8.75

## 2019-09-08 NOTE — ED Notes (Signed)
Pt repositioned in bed for comfort. Call bell within reach. Pt denies further needs.

## 2019-09-08 NOTE — Consult Note (Signed)
Pulmonary Medicine          Date: 09/08/2019,   MRN# [id number] MATHEW STORCK March 18, 1945     AdmissionWeight: 49.9 kg                 CurrentWeight: 49.9 kg   Referring physician: Dr. 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  She has been on chronic suppressive therapy as well as nebulizer therapy for years.  Recently we had initiated after low vest therapy as well as hypertonic saline to advance for bronchopulmonary hygiene.  She has a very large hiatal hernia with severe GERD and chronic microaspiration despite twice daily PPIs.  She generally uses 2 to 3 L/min nasal cannula at home chronically.  Most recent exacerbation of underlying bronchiectasis was approximately 1 year ago.  She also has a background of atrial fibrillation and is on Eliquis.  She came to the ER due to worsening cough and malaise with nausea as well as headache.  She reports that despite using her oxygen therapy she felt air hunger.  Pulmonary consultation was placed for additional evaluation and management of complex pulmonary patient.   PAST MEDICAL HISTORY   Past Medical History:  Diagnosis Date  . Arthritis   . Atrial fibrillation ([place])   . Bronchiectasis ([place])   . CAD (coronary artery disease) 07/30/2017  . Dumping syndrome   . 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 Family history of adverse reaction to anesthesia    sister [name]  . GERD (gastroesophageal reflux disease)   . Headache    MIGRAINES  . Myocardial infarction ([place]) 2007   Non-STEMI  . [name] (postoperative nausea and vomiting)   . Psoriasis   . PUD (peptic ulcer disease)      SURGICAL HISTORY   Past Surgical History:  Procedure Laterality Date  . BACK SURGERY    . CHOLECYSTECTOMY    . ESOPHAGOGASTRODUODENOSCOPY (EGD) WITH PROPOFOL N/A  03/19/2019   Procedure: ESOPHAGOGASTRODUODENOSCOPY (EGD) WITH PROPOFOL;  Surgeon: [name], MD;  Location: [facility] ENDOSCOPY;  Service: Gastroenterology;  Laterality: N/A;  *Note to anesthesia: Per pt's pulmonologist, if intubating, please extubate to BIPAP.  [name] EYE SURGERY    . FOOT SURGERY    . INTRAMEDULLARY (IM) NAIL INTERTROCHANTERIC Right 09/30/2018   Procedure: INTRAMEDULLARY (IM) NAIL INTERTROCHANTRIC;  Surgeon: Dereck Leep, MD;  Location: [facility] ORS;  Service: Orthopedics;  Laterality: Right;  . KYPHOPLASTY N/A 07/05/2016   Procedure: KYPHOPLASTY T - 9;  Surgeon: Hessie Knows, MD;  Location: [facility] ORS;  Service: Orthopedics;  Laterality: N/A;  . KYPHOPLASTY N/A 11/29/2017   Procedure: Iona Hansen;  Surgeon: Hessie Knows, MD;  Location: [facility] ORS;  Service: Orthopedics;  Laterality: N/A;  L2 and L3  . KYPHOPLASTY N/A 12/18/2017   Procedure: KYPHOPLASTY L1;  Surgeon: Hessie Knows, MD;  Location: [facility] ORS;  Service: Orthopedics;  Laterality: N/A;  . KYPHOPLASTY N/A 01/05/2018   Procedure: KYPHOPLASTY-T11,T12;  Surgeon: Hessie Knows, MD;  Location: [facility] ORS;  Service: Orthopedics;  Laterality: N/A;  . KYPHOPLASTY N/A 04/05/2018   Procedure: T10 KYPHOPLASTY;  Surgeon: Hessie Knows, MD;  Location: [facility] ORS;  Service: Orthopedics;  Laterality: N/A;  . KYPHOPLASTY N/A 04/12/2018   Procedure: KYPHOPLASTY T7,8;  Surgeon: Hessie Knows, MD;  Location: [facility] ORS;  Service: Orthopedics;  Laterality: N/A;  . KYPHOPLASTY N/A 04/19/2018   Procedure:  KYPHOPLASTY T5, T6;  Surgeon: Hessie Knows, MD;  Location: [facility] ORS;  Service: Orthopedics;  Laterality: N/A;  . LUNG SURGERY  1990 and 1996  . THOROCOTOMY WITH LOBECTOMY     LEFT LOWER THORACOTOMY / RIGHT MIDDLE LOBECTOMY     FAMILY HISTORY   Family History  Problem Relation Age of Onset  . Hypertension Mother   . Hypertension Father      SOCIAL HISTORY   Social History   Tobacco Use  . Smoking status: Never Smoker  . Smokeless  tobacco: Never Used  Vaping Use  . Vaping Use: Never used  Substance Use Topics  . Alcohol use: No  . Drug use: No     MEDICATIONS    Home Medication:  Current Outpatient Rx  . Order #: [id number] Class: Historical Med  . Order #: [id number] Class: Historical Med  . Order #: [id number] Class: Historical Med  . Order #: [id number] Class: Historical Med  . Order #: [id number] Class: Historical Med  . Order #: [id number] Class: Historical Med  . Order #: [id number] Class: Historical Med  . Order #: [id number] Class: Historical Med  . Order #: [id number] Class: Historical Med  . Order #: [id number] Class: Print  . Order #: [id number] Class: Historical Med  . Order #: [id number] Class: No Print  . Order #: [id number] Class: Historical Med  . Order #: [id number] Class: Historical Med  . Order #: [id number] Class: Historical Med  . Order #: [id number] Class: Historical Med  . Order #: [id number] Class: Historical Med  . Order #: [id number] Class: Historical Med  . Order #: [id number] Class: Historical Med  . Order #: [id number] Class: Historical Med    Current Medication:  Current Facility-Administered Medications:  .  [START ON 09/09/2019] acetylcysteine (MUCOMYST) 20 % nebulizer / oral solution 600 mg, 600 mg, Oral, QPC breakfast, Karmen Bongo, MD .  acidophilus (RISAQUAD) capsule 1 capsule, 1 capsule, Oral, Daily, Karmen Bongo, MD .  albuterol (PROVENTIL) (2.5 MG/3ML) 0.083% nebulizer solution 2.5 mg, 2.5 mg, Nebulization, Q2H PRN, Karmen Bongo, MD .  amLODipine (NORVASC) tablet 5 mg, 5 mg, Oral, Daily, Karmen Bongo, MD, 5 mg at 09/08/19 1226 .  apixaban (ELIQUIS) tablet 5 mg, 5 mg, Oral, BID, Karmen Bongo, MD, 5 mg at 09/08/19 1226 .  cefTAZidime (FORTAZ) 2 g in sodium chloride 0.9 % 100 mL IVPB, 2 g, Intravenous, Q12H, Karmen Bongo, MD, Stopped at 09/08/19 1603 .  chlorpheniramine-HYDROcodone (TUSSIONEX) 10-8 MG/5ML suspension 5 mL, 5 mL, Oral, QHS, Karmen Bongo, MD .  Derrill Memo ON 09/09/2019]  diltiazem (CARDIZEM CD) 24 hr capsule 120 mg, 120 mg, Oral, Daily, Karmen Bongo, MD .  gabapentin (NEURONTIN) capsule 300 mg, 300 mg, Oral, QID, Karmen Bongo, MD, 300 mg at 09/08/19 1225 .  lactated ringers infusion, , Intravenous, Continuous, Karmen Bongo, MD, Last Rate: 75 mL/hr at 09/08/19 0952, New Bag at 09/08/19 0952 .  LORazepam (ATIVAN) tablet 0.5 mg, 0.5 mg, Oral, Q4H, Karmen Bongo, MD .  losartan (COZAAR) tablet 100 mg, 100 mg, Oral, Daily, Karmen Bongo, MD .  methylPREDNISolone sodium succinate (SOLU-MEDROL) 125 mg/2 mL injection 60 mg, 60 mg, Intravenous, Q12H, Karmen Bongo, MD, 60 mg at 09/08/19 1130 .  metoprolol succinate (TOPROL-XL) 24 hr tablet 100 mg, 100 mg, Oral, Daily, Karmen Bongo, MD .  pantoprazole (PROTONIX) EC tablet 40 mg, 40 mg, Oral, Daily, Karmen Bongo, MD .  sodium chloride flush (NS) 0.9 % injection 3 mL, 3 mL, Intravenous, Q12H, Karmen Bongo, MD .  sodium chloride HYPERTONIC 3 % nebulizer solution 4 mL, 4 mL, Inhalation, TID, Karmen Bongo, MD .  traMADol (ULTRAM) tablet 50-100 mg, 50-100 mg, Oral, Q4H PRN, Karmen Bongo, MD  Current Outpatient Medications:  .  amLODipine (NORVASC) 5 MG tablet, Take 5 mg by mouth daily. , Disp: , Rfl:  .  apixaban (ELIQUIS) 5 MG TABS tablet, Take 5 mg by mouth 2 (two) times daily. , Disp: , Rfl:  .  Ascorbic Acid (VITAMIN C) 1000 MG tablet, Take 1,000 mg by mouth daily., Disp: , Rfl:  .  azithromycin (ZITHROMAX) 250 MG tablet, Take 250 mg by mouth daily. , Disp: , Rfl:  .  chlorpheniramine-HYDROcodone (TUSSIONEX) 10-8 MG/5ML SUER, Take 5 mLs by mouth at bedtime. , Disp: , Rfl:  .  ciprofloxacin (CIPRO) 500 MG tablet, Take 500 mg by mouth 2 (two) times daily., Disp: , Rfl:  .  diltiazem (CARDIZEM CD) 120 MG 24 hr capsule, Take 120 mg by mouth daily., Disp: , Rfl:  .  ferrous sulfate 325 (65 FE) MG EC tablet, Take 325 mg by mouth daily. , Disp: , Rfl:  .  gabapentin (NEURONTIN) 300 MG capsule, Take  300 mg by mouth 4 (four) times daily. , Disp: , Rfl:  .  LORazepam (ATIVAN) 0.5 MG tablet, Take 1 tablet (0.5 mg total) by mouth every 4 (four) hours., Disp: 30 tablet, Rfl: 0 .  losartan (COZAAR) 100 MG tablet, Take 100 mg by mouth daily., Disp: , Rfl:  .  metoprolol succinate (TOPROL-XL) 100 MG 24 hr tablet, Take 1 tablet (100 mg total) by mouth daily., Disp: , Rfl:  .  NAC 600 MG CAPS, Take 600 mg by mouth daily after breakfast. , Disp: , Rfl:  .  omeprazole (PRILOSEC) 20 MG capsule, Take 20 mg by mouth daily., Disp: , Rfl:  .  potassium chloride (K-DUR) 10 MEQ tablet, Take 10 mEq by mouth daily., Disp: , Rfl:  .  predniSONE (DELTASONE) 10 MG tablet, Take 10 mg by mouth daily., Disp: , Rfl:  .  Probiotic Product (ALIGN) 4 MG CAPS, Take 4 mg by mouth daily., Disp: , Rfl:  .  Sodium Chloride, Inhalant, 7 % NEBU, Inhale 4 mLs into the lungs 3 (three) times daily., Disp: , Rfl:  .  acetaminophen (TYLENOL) 500 MG tablet, Take 1,000 mg by mouth every 6 (six) hours as needed for mild pain or headache. , Disp: , Rfl:  .  furosemide (LASIX) 20 MG tablet, Take 20 mg by mouth daily as needed for edema., Disp: , Rfl:     ALLERGIES   Codeine, Sulfa antibiotics, and Penicillins     REVIEW OF SYSTEMS    Review of Systems:  Gen:  Denies  fever, sweats, chills weigh loss  HEENT: Denies blurred vision, double vision, ear pain, eye pain, hearing loss, nose bleeds, sore throat Cardiac:  No dizziness, chest pain or heaviness, chest tightness,edema Resp:   Denies cough or sputum porduction, shortness of breath,wheezing, hemoptysis,  Gi: Denies swallowing difficulty, stomach pain, nausea or vomiting, diarrhea, constipation, bowel incontinence Gu:  Denies bladder incontinence, burning urine Ext:   Denies Joint pain, stiffness or swelling Skin: Denies  skin rash, easy bruising or bleeding or hives Endoc:  Denies polyuria, polydipsia , polyphagia or weight change Psych:   Denies depression, insomnia or  hallucinations   Other:  All other systems negative   VS: BP 134/70   Pulse 73   Temp 99.3 F (37.4 C) (Oral)   Resp 18   Ht _0  (1.651 m)   Wt 49.9 kg   SpO2 100%  BMI 18.30 kg/m      PHYSICAL EXAM    GENERAL:NAD, no fevers, chills, no weakness no fatigue HEAD: Normocephalic, atraumatic.  EYES: Pupils equal, round, reactive to light. Extraocular muscles intact. No scleral icterus.  MOUTH: Moist mucosal membrane. Dentition intact. No abscess noted.  EAR, NOSE, THROAT: Clear without exudates. No external lesions.  NECK: Supple. No thyromegaly. No nodules. No JVD.  PULMONARY: Bilateral rhonchorous breath sounds with wheezing CARDIOVASCULAR: S1 and S2. Regular rate and rhythm. No murmurs, rubs, or gallops. No edema. Pedal pulses 2+ bilaterally.  GASTROINTESTINAL: Soft, nontender, nondistended. No masses. Positive bowel sounds. No hepatosplenomegaly.  MUSCULOSKELETAL: No swelling, clubbing, or edema. Range of motion full in all extremities.  NEUROLOGIC: Cranial nerves II through XII are intact. No gross focal neurological deficits. Sensation intact. Reflexes intact.  SKIN: No ulceration, lesions, rashes, or cyanosis. Skin warm and dry. Turgor intact.  PSYCHIATRIC: Mood, affect within normal limits. The patient is awake, alert and oriented x 3. Insight, judgment intact.       IMAGING    DG Chest 1 View  Result Date: 09/08/2019 CLINICAL DATA:  Shortness of breath, cough EXAM: CHEST  1 VIEW COMPARISON:  Chest radiograph dated 10/07/2018. CT chest dated 09/28/2018. FINDINGS: Postsurgical changes in the right upper lobe, right perihilar region, and left mid lung. Chronic interstitial markings in the lungs bilaterally, with suspected bronchiectasis, better evaluated on prior CT. Left lower lobe opacity, likely reflecting atelectasis with associated eventration of left hemidiaphragm when correlating with prior CT. The heart is normal in size. Multilevel thoracolumbar vertebral  augmentation. Cholecystectomy clips. IMPRESSION: No evidence of acute cardiopulmonary disease. Chronic and postprocedural changes, as above. Electronically Signed   By: Julian Hy M.D.   On: 09/08/2019 07:52      ASSESSMENT/PLAN   Acute exacerbation of bronchiectasis -Complicated by severe GERD due to large hiatal hernia with microaspiration -Noted ceftazidime for antibiotic regimen, will also add amikacin IV twice daily -Agree with Solu-Medrol 60 twice daily will transition to 40 twice daily tomorrow -Respiratory viral panel -Pro calcitonin trend -D-dimer -Codeine Hycodan 5 mL every 8 hours -PT OT when possible  -Blood cultures x2 -Chest physiotherapy with MetaNeb for twice daily -Mucomyst 4 mL twice daily -Protonix 40 twice daily IV    Thank you for allowing me to participate in the care of this patient.   Patient/Family are satisfied with care plan and all questions have been answered.  This document was prepared using Dragon voice recognition software and may include unintentional dictation errors.     Ottie Glazier, M.D.  Division of [place]

## 2019-09-08 NOTE — ED Triage Notes (Signed)
Patient c/o low nausea, malaise beginning last night. Patient's home oxygen machine stopped working this morning at around 2am. Patient is on 2L at base line. Patient c/o SOB and wet/productive cough.

## 2019-09-08 NOTE — Consult Note (Signed)
Pharmacy Antibiotic Note  Ana Washington is a 74 y.o. female admitted on 09/08/2019 with a bronchiectasis exacerbation.  Pharmacy has been consulted for ceftazidime dosing. This patient has a documented penicillin allergy but according to medical records received cefazolin in the past with no documented side-effects. Her renal function on admission is at what appears to be her baseline level.  Plan:  Start ceftazidime 2 grams IV every 12 hours  Height: 5\' 5"  (165.1 cm) Weight: 49.9 kg (110 lb) IBW/kg (Calculated) : 57  Temp (24hrs), Avg:99.3 F (37.4 C), Min:99.3 F (37.4 C), Max:99.3 F (37.4 C)  Recent Labs  Lab 09/08/19 0647 09/08/19 0650  WBC  --  17.5*  CREATININE  --  0.65  LATICACIDVEN 1.0  --     Estimated Creatinine Clearance: 47.9 mL/min (by C-G formula based on SCr of 0.65 mg/dL).    Allergies  Allergen Reactions   Sulfa Antibiotics Diarrhea   Codeine Nausea And Vomiting   Penicillins Rash    Has patient had a PCN reaction causing immediate rash, facial/tongue/throat swelling, SOB or lightheadedness with hypotension: Unknown Has patient had a PCN reaction causing severe rash involving mucus membranes or skin necrosis: Unknown Has patient had a PCN reaction that required hospitalization: Unknown Has patient had a PCN reaction occurring within the last 10 years: No If all of the above answers are "NO", then may proceed with Cephalosporin use.     Antimicrobials this admission: Vancomycin 8/8 x 1 cefepime 8/8 x 1 ceftazidime 8/8 >>   Microbiology results: 8/8 BCx: pending 8/8 UCx: pending  8/8 SARS CoV-2: negative  8/8 sputum Cx: pending  Thank you for allowing pharmacy to be a part of this patients care.  Dallie Piles 09/08/2019 9:04 AM

## 2019-09-08 NOTE — ED Notes (Signed)
ED Provider at bedside. 

## 2019-09-08 NOTE — ED Provider Notes (Signed)
[facility] Emergency Department Provider Note  ____________________________________________   First MD Initiated Contact with Patient 09/08/19 [phone]     (approximate)  I have reviewed the triage vital signs and the nursing notes.   HISTORY  Chief Complaint Shortness of Breath   HPI Ana Washington is a 74 y.o. female with past medical history of arthritis, A. 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  Patient denies any fevers, headache, earache, sore throat, chest pain, vomiting, abdominal pain, back pain, diarrhea, vaginal bleeding, blood in her stool, rash, or extremity pain.  She endorses chronic burning with urination.  She feels her shortness of breath is similar to prior lung infections she has had in the past.  No clear alleviating or aggravating factors.         Past Medical History:  Diagnosis Date  . Arthritis   . Atrial fibrillation ([place])   . Bronchiectasis ([place])   . CAD (coronary artery disease) 07/30/2017  . Dumping syndrome   . Dyspnea   . Dysrhythmia    afib  . 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 Family history of adverse reaction to anesthesia    sister [name]  . GERD (gastroesophageal reflux disease)   . Headache    MIGRAINES  . Hypertension   . Lung disease   . Myocardial infarction ([place]) 2007   Non-STEMI  . [name] (postoperative nausea and vomiting)   . Psoriasis   . PUD (peptic ulcer disease)     Patient Active Problem List   Diagnosis Date Noted  . Bronchiectasis ([place]) 09/08/2019  . Closed right hip fracture ([place]) 09/27/2018  . Intractable nausea and vomiting 04/08/2018  . Atrial fibrillation with RVR ([place])  04/08/2018  . Thoracic radiculitis (Bilateral) 03/29/2018  . Vasovagal episode 03/29/2018  . Closed compression fracture of T10 thoracic vertebra, sequela 03/29/2018  . Abnormal MRI, lumbar spine (12/15/2016) 03/21/2018  . Lumbar compression fractures, sequela (L1, L2, L3, L4, and L5) 03/21/2018  . Closed compression fracture of L1 lumbar vertebra, sequela 03/21/2018  . Closed compression fracture of L2 lumbar vertebra, sequela 03/21/2018  . Closed compression fracture of L3 lumbar vertebra, sequela 03/21/2018  . Closed compression fracture of L4 lumbar vertebra, sequela 03/21/2018  . Closed compression fracture of L5 lumbar vertebra, sequela 03/21/2018  . Thoracic compression fracture, sequela (T5, T9, T10, T11, and T12) 03/21/2018  . Closed compression fracture of T5 thoracic vertebra, sequela 03/21/2018  . Closed compression fracture of T9 thoracic vertebra, sequela 03/21/2018  . Close compression fracture of T11 thoracic vertebra, sequela 03/21/2018  . Closed compression fracture of T12 thoracic vertebra, sequela 03/21/2018  . Lumbar facet hypertrophy 03/21/2018  . Grade 1  Lumbar Anterolisthesis of L3/4 and L4/5 03/21/2018  . Lumbar central spinal stenosis (Multilevel), 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 claudication 03/21/2018  . Chronic anticoagulation (ELIQUIS) 03/21/2018  . History of pelvic fracture 03/21/2018  . Chronic musculoskeletal pain 03/21/2018  . Neurogenic pain 03/21/2018  . Long term prescription benzodiazepine use 03/21/2018  . 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 Adult bronchiectasis ([place]) 03/01/2018  . Diverticulitis 03/01/2018  . 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[place]) 03/01/2018  . Migraines 03/01/2018  . 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  Psoriasis 03/01/2018  . Chronic upper back pain (Primary Area of Pain) (Bilateral) (R>L) 03/01/2018  . Chronic low back pain (Secondary Area of Pain) (Bilateral) (R>L)  w/o sciatica 03/01/2018  . Chronic pain syndrome 03/01/2018  . Long term current use of opiate analgesic 03/01/2018  . Pharmacologic therapy 03/01/2018  . Disorder of skeletal system 03/01/2018  . Problems influencing health status 03/01/2018  . History of kyphoplasty (L1, L2, L3, T9, T11, and T12) 01/05/2018  . Protein-calorie malnutrition, severe 08/03/2017  . GERD (gastroesophageal reflux disease) 07/30/2017  . Pelvic fracture ([place]) 07/30/2017  . Paroxysmal atrial fibrillation ([place]) 07/30/2017  . Other dysphagia 09/13/2016  . Unintended weight loss 09/13/2016  . Essential hypertension 10/03/2013  . Osteoarthritis of knees (Bilateral) 10/03/2013  . Primary localized osteoarthrosis, lower leg 10/03/2013  . Non-ischemic cardiomyopathy ([place]) 09/12/2013  . Coronary artery disease involving native coronary artery of native heart without angina pectoris 07/28/2012  . Hyperlipidemia, mixed 07/28/2012  . DDD (degenerative disc disease), lumbar 02/29/2012    Past Surgical History:  Procedure Laterality Date  . BACK SURGERY    . CHOLECYSTECTOMY    . ESOPHAGOGASTRODUODENOSCOPY (EGD) WITH PROPOFOL N/A 03/19/2019   Procedure: ESOPHAGOGASTRODUODENOSCOPY (EGD) WITH PROPOFOL;  Surgeon: [name], MD;  Location[facility];  Service: Gastroenterology;  Laterality: N/A;  *Note to anesthesia: Per pt's pulmonologist, if intubating, please extubate to BIPAP.  [name] EYE SURGERY    . FOOT SURGERY    . INTRAMEDULLARY (IM) NAIL INTERTROCHANTERIC Right 09/30/2018   Procedure: INTRAMEDULLARY (IM) NAIL INTERTROCHANTRIC;  Surgeon: Dereck Leep, MD;  Location: [facility] ORS;  Service: Orthopedics;  Laterality: Right;  . KYPHOPLASTY N/A 07/05/2016   Procedure: KYPHOPLASTY T - 9;  Surgeon: Hessie Knows, MD;  Location: [facility] ORS;  Service: Orthopedics;  Laterality: N/A;  . KYPHOPLASTY N/A 11/29/2017   Procedure: Iona Hansen;  Surgeon: Hessie Knows, MD;  Location: [facility] ORS;  Service: Orthopedics;  Laterality:  N/A;  L2 and L3  . KYPHOPLASTY N/A 12/18/2017   Procedure: KYPHOPLASTY L1;  Surgeon: Hessie Knows, MD;  Location: [facility] ORS;  Service: Orthopedics;  Laterality: N/A;  . KYPHOPLASTY N/A 01/05/2018   Procedure: KYPHOPLASTY-T11,T12;  Surgeon: Hessie Knows, MD;  Location: [facility] ORS;  Service: Orthopedics;  Laterality: N/A;  . KYPHOPLASTY N/A 04/05/2018   Procedure: T10 KYPHOPLASTY;  Surgeon: Hessie Knows, MD;  Location: [facility] ORS;  Service: Orthopedics;  Laterality: N/A;  . KYPHOPLASTY N/A 04/12/2018   Procedure: KYPHOPLASTY T7,8;  Surgeon: Hessie Knows, MD;  Location: [facility] ORS;  Service: Orthopedics;  Laterality: N/A;  . KYPHOPLASTY N/A 04/19/2018   Procedure: KYPHOPLASTY T5, T6;  Surgeon: Hessie Knows, MD;  Location: [facility] ORS;  Service: Orthopedics;  Laterality: N/A;  . LUNG SURGERY  1990 and 1996  . THOROCOTOMY WITH LOBECTOMY     LEFT LOWER THORACOTOMY / RIGHT MIDDLE LOBECTOMY    Prior to Admission medications   Medication Sig Start Date End Date Taking? Authorizing Provider  acetaminophen (TYLENOL) 500 MG tablet Take 1,000 mg by mouth every 6 (six) hours as needed for mild pain or headache.     [provider]  amLODipine (NORVASC) 5 MG tablet Take 5 mg by mouth daily.  11/04/13   [provider]  apixaban (ELIQUIS) 5 MG TABS tablet Take 5 mg by mouth 2 (two) times daily.     [provider]  Ascorbic Acid (VITAMIN C) 1000 MG tablet Take 1,000 mg by mouth daily.    [provider]  azithromycin (ZITHROMAX) 250 MG tablet Take 250  mg by mouth daily.     [provider]  Calcium Carbonate-Vitamin D (OYSTER SHELL/VITAMIN D) 600-125 MG-UNIT TABS Take 1 tablet by mouth daily. 11/05/07   [provider]  chlorpheniramine-HYDROcodone (TUSSIONEX) 10-8 MG/5ML SUER Take 5 mLs by mouth at bedtime.  04/24/18   [provider]  ciprofloxacin (CIPRO) 500 MG tablet Take 500 mg by mouth 2 (two) times daily.    [provider]  diltiazem  (CARDIZEM CD) 180 MG 24 hr capsule Take 1 capsule (180 mg total) by mouth daily. 10/11/18   Hillary Bow, MD  ferrous sulfate 325 (65 FE) MG EC tablet Take 325 mg by mouth daily.     [provider]  gabapentin (NEURONTIN) 300 MG capsule Take 300 mg by mouth 4 (four) times daily.     [provider]  LORazepam (ATIVAN) 0.5 MG tablet Take 1 tablet (0.5 mg total) by mouth every 4 (four) hours. 10/07/18   Bettey Costa, MD  metoprolol succinate (TOPROL-XL) 100 MG 24 hr tablet Take 1 tablet (100 mg total) by mouth daily. 10/11/18   Hillary Bow, MD  pantoprazole (PROTONIX) 40 MG tablet Take 1 tablet (40 mg total) by mouth daily. 04/15/18 06/14/18  Henreitta Leber, MD  potassium chloride (K-DUR) 10 MEQ tablet Take 10 mEq by mouth daily.    [provider]  predniSONE (DELTASONE) 10 MG tablet Take 10 mg by mouth daily. 03/28/19   [provider]  Probiotic Product (ALIGN) 4 MG CAPS Take 4 mg by mouth daily.    [provider]  Sodium Chloride, Inhalant, 7 % NEBU Inhale 4 mLs into the lungs 3 (three) times daily. 04/05/18   [provider]  traMADol (ULTRAM) 50 MG tablet Take 1-2 tablets (50-100 mg total) by mouth every 4 (four) hours as needed for moderate pain. 10/06/18   Bettey Costa, MD    Allergies Sulfa antibiotics, Codeine, and Penicillins  Family History  Problem Relation Age of Onset  . Hypertension Mother   . Hypertension Father     Social History Social History   Tobacco Use  . Smoking status: Never Smoker  . Smokeless tobacco: Never Used  Vaping Use  . Vaping Use: Never used  Substance Use Topics  . Alcohol use: No  . Drug use: No    Review of Systems  Review of Systems  Constitutional: Positive for chills. Negative for fever.  HENT: Negative for sore throat.   Eyes: Negative for pain.  Respiratory: Positive for cough and shortness of breath. Negative for stridor.   Cardiovascular: Negative for chest pain.  Gastrointestinal:  Positive for nausea. Negative for vomiting.  Skin: Negative for rash.  Neurological: Negative for seizures, loss of consciousness and headaches.  Psychiatric/Behavioral: Negative for suicidal ideas.  All other systems reviewed and are negative.     ____________________________________________   PHYSICAL EXAM:  VITAL SIGNS: ED Triage Vitals  Enc Vitals Group     BP 09/08/19 0646 108/78     Pulse Rate 09/08/19 0646 89     Resp 09/08/19 0646 (!) 30     Temp 09/08/19 0646 99.3 F (37.4 C)     Temp Source 09/08/19 0646 Oral     SpO2 09/08/19 0646 95 %     Weight 09/08/19 0644 110 lb (49.9 kg)     Height 09/08/19 0644 _0  (1.651 m)     Head Circumference --      Peak Flow --      Pain Score  09/08/19 0646 0     Pain Loc --      Pain Edu? --      Excl. in [place]? --    Vitals:   09/08/19 0646  BP: 108/78  Pulse: 89  Resp: (!) 30  Temp: 99.3 F (37.4 C)  SpO2: 95%   Physical Exam Vitals and nursing note reviewed.  Constitutional:      General: She is not in acute distress.    Appearance: She is well-developed.  HENT:     Head: Normocephalic and atraumatic.     Right Ear: External ear normal.     Left Ear: External ear normal.     Nose: Nose normal.  Eyes:     Conjunctiva/sclera: Conjunctivae normal.  Cardiovascular:     Rate and Rhythm: Normal rate and regular rhythm.     Heart sounds: No murmur heard.   Pulmonary:     Effort: Pulmonary effort is normal. Tachypnea present. No respiratory distress.     Breath sounds: Rhonchi present.  Abdominal:     Palpations: Abdomen is soft.     Tenderness: There is no abdominal tenderness.  Musculoskeletal:     Cervical back: Neck supple.     Right lower leg: No edema.     Left lower leg: No edema.  Skin:    General: Skin is warm and dry.     Capillary Refill: Capillary refill takes less than 2 seconds.  Neurological:     Mental Status: She is alert and oriented to person, place, and time.  Psychiatric:        Mood and  Affect: Mood normal.      ____________________________________________   LABS (all labs ordered are listed, but only abnormal results are displayed)  Labs Reviewed  CBC WITH DIFFERENTIAL/PLATELET - Abnormal; Notable for the following components:      Result Value   WBC 17.5 (*)    Neutro Abs 12.4 (*)    Monocytes Absolute 1.4 (*)    Abs Immature Granulocytes 0.08 (*)    All other components within normal limits  COMPREHENSIVE METABOLIC PANEL - Abnormal; Notable for the following components:   Potassium 2.9 (*)    Chloride 92 (*)    CO2 37 (*)    Calcium 8.8 (*)    Total Protein 6.2 (*)    All other components within normal limits  BRAIN NATRIURETIC PEPTIDE - Abnormal; Notable for the following components:   B Natriuretic Peptide 101.3 (*)    All other components within normal limits  BLOOD GAS, VENOUS - Abnormal; Notable for the following components:   pO2, Ven <31.0 (*)    Bicarbonate 38.0 (*)    Acid-Base Excess 11.0 (*)    All other components within normal limits  MAGNESIUM - Abnormal; Notable for the following components:   Magnesium 1.5 (*)    All other components within normal limits  TROPONIN I (HIGH SENSITIVITY) - Abnormal; Notable for the following components:   Troponin I (High Sensitivity) 19 (*)    All other components within normal limits  SARS CORONAVIRUS 2 BY RT PCR (HOSPITAL ORDER, [place] [facility])  CULTURE, BLOOD (ROUTINE X 2)  CULTURE, BLOOD (ROUTINE X 2)  URINE CULTURE  EXPECTORATED SPUTUM ASSESSMENT W REFEX TO RESP CULTURE  LACTIC ACID, PLASMA  URINALYSIS, COMPLETE (UACMP) WITH MICROSCOPIC  LACTIC ACID, PLASMA   ____________________________________________  EKG  Sinus rhythm with a ventricular of 93, normal axis, normal intervals, Q waves in anterior  leads with no evidence of acute ischemia or other underlying arrhythmia.  These Q waves appear grossly unchanged when compared to  prior. ____________________________________________  RADIOLOGY  ED MD interpretation: Chronic changes without evidence of acute focal consolidation.  Official radiology report(s): DG Chest 1 View  Result Date: 09/08/2019 CLINICAL DATA:  Shortness of breath, cough EXAM: CHEST  1 VIEW COMPARISON:  Chest radiograph dated 10/07/2018. CT chest dated 09/28/2018. FINDINGS: Postsurgical changes in the right upper lobe, right perihilar region, and left mid lung. Chronic interstitial markings in the lungs bilaterally, with suspected bronchiectasis, better evaluated on prior CT. Left lower lobe opacity, likely reflecting atelectasis with associated eventration of left hemidiaphragm when correlating with prior CT. The heart is normal in size. Multilevel thoracolumbar vertebral augmentation. Cholecystectomy clips. IMPRESSION: No evidence of acute cardiopulmonary disease. Chronic and postprocedural changes, as above. Electronically Signed   By: Julian Hy M.D.   On: 09/08/2019 07:52    ____________________________________________   PROCEDURES  Procedure(s) performed (including Critical Care):  .1-3 Lead EKG Interpretation Performed by: Lucrezia Starch, MD Authorized by: Lucrezia Starch, MD     Interpretation: normal     ECG rate assessment: normal     Rhythm: sinus rhythm     Ectopy: none     Conduction: normal   .Critical Care Performed by: Lucrezia Starch, MD Authorized by: Lucrezia Starch, MD   Critical care provider statement:    Critical care time (minutes):  45   Critical care time was exclusive of:  Separately billable procedures and treating other patients   Critical care was necessary to treat or prevent imminent or life-threatening deterioration of the following conditions:  Sepsis and respiratory failure     ____________________________________________   INITIAL IMPRESSION / ASSESSMENT AND PLAN / ED COURSE        Overall patient's history, exam, and ED work-up  is concerning for pneumonia despite no focal consolidations on chest x-ray.  On arrival patient is noted to be tachypneic and requiring 5 L nasal cannula to maintain her SPO2 saturations in the mid 90s which is higher than her typical 2 L.  In addition she is noted to to have an elevated white blood cell count of 17 and when taken into the context of history of change in her chronic cough from white to green sputum is concerning for pneumonia.  Given concern for pneumonia with multiple SIRS criteria met code sepsis initiated and patient was given broad-spectrum antibiotics, cultures were drawn, and patient was given IV fluids.  Low suspicion for PE as patient is anticoagulated on Eliquis and states she is compliant with his medication.  Low suspicion for ACS as patient has a reassuring EKG and denies any chest pain.  However we will plan to obtain troponins to assess for evidence of cardiac ischemia.  Patient does not appear volume overloaded on exam or chest x-ray and BNP is not elevated.  Patient is noted to be hyperkalemic but there are no other significant electrolyte or metabolic derangements identified likely explain patient's symptoms.  Will plan to admit to medicine service for further evaluation management.  Medications  ceFEPIme (MAXIPIME) 1 g in sodium chloride 0.9 % 100 mL IVPB (1 g Intravenous New Bag/Given 09/08/19 0756)  vancomycin (VANCOCIN) IVPB 1000 mg/200 mL premix (has no administration in time range)  lactated ringers bolus 1,000 mL (1,000 mLs Intravenous New Bag/Given 09/08/19 0756)    And  lactated ringers bolus 500 mL (has no administration in time range)  ondansetron (ZOFRAN) injection 4 mg (4 mg Intravenous Given 09/08/19 0748)  potassium chloride SA (KLOR-CON) CR tablet 40 mEq (40 mEq Oral Given 09/08/19 0746)     ____________________________________________   FINAL CLINICAL IMPRESSION(S) / ED DIAGNOSES  Final diagnoses:  Hypokalemia  Acute on chronic respiratory failure with  hypoxia (HCC)  Pneumonia due to infectious organism, unspecified laterality, unspecified part of lung  Sepsis, due to unspecified organism, unspecified whether acute organ dysfunction present [facility])    ED Discharge Orders    None       Note:  This document was prepared using Dragon voice recognition software and may include unintentional dictation errors.   Lucrezia Starch, MD 09/08/19 [phone]

## 2019-09-08 NOTE — H&P (Signed)
History and Physical    SHALIN LINDERS XNT:[id number] DOB: [birth year] DOA: 09/08/2019  PCP: [name], MD Consultants:  Lanney Gins - pulmonology; Anthimus.Gallant - cardiology Patient coming from: Home - lives with husband; NOK: Olam Idler Dailynn Nancarrow, [phone]  Chief Complaint: SOB  HPI: DARNEISHA WINDHORST is a 74 y.o. female with medical history significant of chronic respiratory failure, on home O2 (2L), due to bronchiectasis; HTN; and afib on Eliquis presenting with SOB.  She woke up about 2AM and was nauseated with cough, felt bad and O2 was low.  The oxygen tank didn't seem to be working and so she transitioned to the small tank and it still wouldn't get better.  She had a severe headache.  She has been feeling ok lately.  Yesterday afternoon she started feeling not as good - coughing and weak and a little nauseated.  No fever.   ED Course: Bronchiectasis, usually no 2L.  ?machine not working last night.  Increased SOB, now on 5L.  Increased green sputum.  ?exacerbation vs. PNA.  WBC 17.  Cultures pending.  Given Vanc/Cefepime.  On nebs and PT vest at home.  Review of Systems: As per HPI; otherwise review of systems reviewed and negative.   Ambulatory Status:  Ambulates with a walker  COVID Vaccine Status:   Complete  Past Medical History:  Diagnosis Date  . Arthritis   . Atrial fibrillation ([place])   . Bronchiectasis ([place])   . CAD (coronary artery disease) 07/30/2017  . Dumping syndrome   . 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 Family history of adverse reaction to anesthesia    sister [name]  . GERD (gastroesophageal reflux disease)   . Headache    MIGRAINES  . Myocardial infarction ([place]) 2007   Non-STEMI  . [name] (postoperative nausea and vomiting)   . Psoriasis   . PUD (peptic ulcer disease)     Past Surgical History:  Procedure Laterality Date  . BACK SURGERY    . CHOLECYSTECTOMY    . ESOPHAGOGASTRODUODENOSCOPY (EGD) WITH PROPOFOL N/A 03/19/2019   Procedure:  ESOPHAGOGASTRODUODENOSCOPY (EGD) WITH PROPOFOL;  Surgeon: [name], MD;  Location[facility] ENDOSCOPY;  Service: Gastroenterology;  Laterality: N/A;  *Note to anesthesia: Per pt's pulmonologist, if intubating, please extubate to BIPAP.  [name] EYE SURGERY    . FOOT SURGERY    . INTRAMEDULLARY (IM) NAIL INTERTROCHANTERIC Right 09/30/2018   Procedure: INTRAMEDULLARY (IM) NAIL INTERTROCHANTRIC;  Surgeon: Dereck Leep, MD;  Location: [facility] ORS;  Service: Orthopedics;  Laterality: Right;  . KYPHOPLASTY N/A 07/05/2016   Procedure: KYPHOPLASTY T - 9;  Surgeon: Hessie Knows, MD;  Location: [facility] ORS;  Service: Orthopedics;  Laterality: N/A;  . KYPHOPLASTY N/A 11/29/2017   Procedure: Iona Hansen;  Surgeon: Hessie Knows, MD;  Location: [facility] ORS;  Service: Orthopedics;  Laterality: N/A;  L2 and L3  . KYPHOPLASTY N/A 12/18/2017   Procedure: KYPHOPLASTY L1;  Surgeon: Hessie Knows, MD;  Location: [facility] ORS;  Service: Orthopedics;  Laterality: N/A;  . KYPHOPLASTY N/A 01/05/2018   Procedure: KYPHOPLASTY-T11,T12;  Surgeon: Hessie Knows, MD;  Location: [facility] ORS;  Service: Orthopedics;  Laterality: N/A;  . KYPHOPLASTY N/A 04/05/2018   Procedure: T10 KYPHOPLASTY;  Surgeon: Hessie Knows, MD;  Location: [facility] ORS;  Service: Orthopedics;  Laterality: N/A;  . KYPHOPLASTY N/A 04/12/2018   Procedure: KYPHOPLASTY T7,8;  Surgeon: Hessie Knows, MD;  Location: [facility] ORS;  Service: Orthopedics;  Laterality: N/A;  . KYPHOPLASTY N/A 04/19/2018   Procedure: KYPHOPLASTY T5, T6;  Surgeon: Hessie Knows, MD;  Location: [facility] ORS;  Service: Orthopedics;  Laterality: N/A;  . LUNG SURGERY  1990 and 1996  . THOROCOTOMY WITH LOBECTOMY     LEFT LOWER THORACOTOMY / RIGHT MIDDLE LOBECTOMY    Social History   Socioeconomic History  . Marital status: Married    Spouse name: Not on file  . Number of children: Not on file  . Years of education: Not on file  . Highest education level: Not on file  Occupational History  . Not on file    Tobacco Use  . Smoking status: Never Smoker  . Smokeless tobacco: Never Used  Vaping Use  . Vaping Use: Never used  Substance and Sexual Activity  . Alcohol use: No  . Drug use: No  . Sexual activity: Not on file  Other Topics Concern  . Not on file  Social History Narrative  . Not on file   Social Determinants of Health   Financial Resource Strain:   . Difficulty of Paying Living Expenses:   Food Insecurity:   . Worried About Charity fundraiser in the Last Year:   . Arboriculturist in the Last Year:   Transportation Needs:   . Film/video editor (Medical):   [name] Lack of Transportation (Non-Medical):   Physical Activity:   . Days of Exercise per Week:   . Minutes of Exercise per Session:   Stress:   . Feeling of Stress :   Social Connections:   . Frequency of Communication with Friends and Family:   . Frequency of Social Gatherings with Friends and Family:   . Attends Religious Services:   . Active Member of Clubs or Organizations:   . Attends Archivist Meetings:   [name] Marital Status:   Intimate Partner Violence:   . Fear of Current or Ex-Partner:   . Emotionally Abused:   [name] Physically Abused:   . Sexually Abused:     Allergies  Allergen Reactions  . Codeine Nausea And Vomiting  . Sulfa Antibiotics Diarrhea  . Penicillins Rash    Has patient had a PCN reaction causing immediate rash, facial/tongue/throat swelling, SOB or lightheadedness with hypotension: Unknown Has patient had a PCN reaction causing severe rash involving mucus membranes or skin necrosis: Unknown Has patient had a PCN reaction that required hospitalization: Unknown Has patient had a PCN reaction occurring within the last 10 years: No If all of the above answers are "NO", then may proceed with Cephalosporin use.     Family History  Problem Relation Age of Onset  . Hypertension Mother   . Hypertension Father     Prior to Admission medications   Medication Sig Start Date End  Date Taking? Authorizing Provider  acetaminophen (TYLENOL) 500 MG tablet Take 1,000 mg by mouth every 6 (six) hours as needed for mild pain or headache.     [provider]  amLODipine (NORVASC) 5 MG tablet Take 5 mg by mouth daily.  11/04/13   [provider]  apixaban (ELIQUIS) 5 MG TABS tablet Take 5 mg by mouth 2 (two) times daily.     [provider]  Ascorbic Acid (VITAMIN C) 1000 MG tablet Take 1,000 mg by mouth daily.    [provider]  azithromycin (ZITHROMAX) 250 MG tablet Take 250 mg by mouth daily.     [provider]  Calcium Carbonate-Vitamin D (OYSTER SHELL/VITAMIN D) 600-125 MG-UNIT TABS Take 1 tablet by mouth daily. 11/05/07   [provider]  chlorpheniramine-HYDROcodone (  TUSSIONEX) 10-8 MG/5ML SUER Take 5 mLs by mouth at bedtime.  04/24/18   [provider]  ciprofloxacin (CIPRO) 500 MG tablet Take 500 mg by mouth 2 (two) times daily.    [provider]  diltiazem (CARDIZEM CD) 180 MG 24 hr capsule Take 1 capsule (180 mg total) by mouth daily. 10/11/18   Hillary Bow, MD  ferrous sulfate 325 (65 FE) MG EC tablet Take 325 mg by mouth daily.     [provider]  gabapentin (NEURONTIN) 300 MG capsule Take 300 mg by mouth 4 (four) times daily.     [provider]  LORazepam (ATIVAN) 0.5 MG tablet Take 1 tablet (0.5 mg total) by mouth every 4 (four) hours. 10/07/18   Bettey Costa, MD  metoprolol succinate (TOPROL-XL) 100 MG 24 hr tablet Take 1 tablet (100 mg total) by mouth daily. 10/11/18   Hillary Bow, MD  pantoprazole (PROTONIX) 40 MG tablet Take 1 tablet (40 mg total) by mouth daily. 04/15/18 06/14/18  Henreitta Leber, MD  potassium chloride (K-DUR) 10 MEQ tablet Take 10 mEq by mouth daily.    [provider]  predniSONE (DELTASONE) 10 MG tablet Take 10 mg by mouth daily. 03/28/19   [provider]  Probiotic Product (ALIGN) 4 MG CAPS Take 4 mg by mouth daily.    [provider]  Sodium Chloride, Inhalant, 7 % NEBU Inhale 4 mLs into the lungs 3 (three) times daily. 04/05/18   [provider]  traMADol (ULTRAM) 50 MG tablet Take 1-2 tablets (50-100 mg total) by mouth every 4 (four) hours as needed for moderate pain. 10/06/18   Bettey Costa, MD    Physical Exam: Vitals:   09/08/19 0830 09/08/19 0900 09/08/19 0930 09/08/19 0954  BP: 124/81 124/76 119/77   Pulse: 79 77 72 73  Resp: (!) 27 20 16  (!) 27  Temp:      TempSrc:      SpO2: 92% 93% 100% 100%  Weight:      Height:         . General:  Appears calm and comfortable and is NAD, mildly cantankerous . Eyes:  PERRL, EOMI, normal lids, iris . ENT:   Hard of hearing, grossly normal lips & tongue, mmm; appropriate dentition . Neck:  no LAD, masses or thyromegaly . Cardiovascular:  RRR, no m/r/g. No LE edema.  [name] Respiratory:   Coarse scattered rales.  Mildly increased respiratory effort, on 5L [place] O2. . Abdomen:  soft, NT, ND, NABS . Skin:  no rash or induration seen on limited exam . Musculoskeletal:  grossly normal tone BUE/BLE, good ROM, no bony abnormality . Psychiatric:  grossly normal mood and affect, speech fluent and appropriate, AOx3 . Neurologic:  CN 2-12 grossly intact, moves all extremities in coordinated fashion    Radiological Exams on Admission: DG Chest 1 View  Result Date: 09/08/2019 CLINICAL DATA:  Shortness of breath, cough EXAM: CHEST  1 VIEW COMPARISON:  Chest radiograph dated 10/07/2018. CT chest dated 09/28/2018. FINDINGS: Postsurgical changes in the right upper lobe, right perihilar region, and left mid lung. Chronic interstitial markings in the lungs bilaterally, with suspected bronchiectasis, better evaluated on prior CT. Left lower lobe opacity, likely reflecting atelectasis with associated eventration of left hemidiaphragm when correlating with prior CT. The heart is normal in size. Multilevel thoracolumbar vertebral augmentation. Cholecystectomy clips. IMPRESSION: No  evidence of acute cardiopulmonary disease. Chronic and postprocedural changes, as above. Electronically Signed   By: Julian Hy  M.D.   On: 09/08/2019 07:52    EKG: Independently reviewed.  NSR with rate 93; nonspecific ST changes with no evidence of acute ischemia   Labs on Admission: I have personally reviewed the available labs and imaging studies at the time of the admission.  Pertinent labs:   K+ 2.9 CO2 37 Magnesium 1.5 BNP 101.3 - stable HS troponin 19; improved from 09/2018 WBC 17.5 COVID negative   Assessment/Plan Principal Problem:   Bronchiectasis (HCC) Active Problems:   Essential hypertension   Hyperlipidemia, mixed   Chronic pain syndrome   Chronic anticoagulation (ELIQUIS)   Atrial fibrillation, chronic (HCC)    Acute on chronic respiratory failure associated with a Bronchiectasis exacerbation -Patient's shortness of breath and productive cough are most likely caused by acute bronchiectasis exacerbation.  -She has history of O2-dependent bronchiectasis and is not certain if her O2 tank was working last night -She does not have fever but does have leukocytosis.  -Chest x-ray is not consistent with pneumonia -will admit patient - with her failure of outpatient therapy (on Cipro and Azithro daily) and decreased O2 sats despite home O2, it seems likely that she will need several days of hospitalization to show sufficient improvement for discharge. -Nebulizers:  prn albuterol -Solu-Medrol 80 mg IV BID (in lieu of home prednisone) -IV Ceftazidime -Pulmonology consult -Continue qAM NAC, qhs Tussionex -Continue Ativan  HTN -Continue Norvasc, Cozaar, Toprol XL  HLD -She does not appear to be taking medications for this issue at this time  Afib -Rate control with Cardizem, Toprol XL -Eliquis for AC  Chronic pain -Continue Neurontin -I have reviewed this patient in the [place] Controlled Substances Reporting System.  She is receiving medications from only  one provider and appears to be taking them as prescribed. -She is not at particularly high risk of opioid misuse, diversion, or overdose.  Malnutrition -The patient's BMI is Body mass index is 18.3 kg/m..  -The patient has at least 2 indicators for malnutrition (insufficient energy intake, weight loss, loss of muscle mass, loss of subcutaneous fat).  -This is likely due to starvation related/chronic disease -Will obtain a nutrition consult for further recommendations.    Note: This patient has been tested and is negative for the novel coronavirus COVID-19.     DVT prophylaxis: Eliquis Code Status:  DNR - confirmed with patient Family Communication: None present;I attempted to call her husband and was unable to reach him Disposition Plan:  The patient is from: home  Anticipated d/c is to: home without [facility] services once her respiratory issues have been resolved.  Anticipated d/c date will depend on clinical response to treatment, but possibly in the next 2-3 days if she has excellent response to treatment  Patient is currently: acutely ill Consults called: Pulmonology; PT/OT/Nutrition Admission status: Admit - It is my clinical opinion that admission to INPATIENT is reasonable and necessary because this patient will require at least 2 midnights in the hospital to treat this condition based on the medical complexity of the problems presented.  Given the aforementioned information, the predictability of an adverse outcome is felt to be significant.    Karmen Bongo MD [facility]   How to contact the [facility] Attending or Consulting provider [place] or covering provider during after hours [place], for this patient?  1. Check the care team in [facility] and look for a) attending/consulting [facility] provider listed and b) the [facility] team listed 2. Log into [email or web address] and use 's universal password to access.  If you do not have the password, please contact the hospital operator. 3. Locate  the [facility] provider you are looking for under [facility] and page to a number that you can be directly reached. 4. If you still have difficulty reaching the provider, please page the [facility] (Director on Call) for the Hospitalists listed on amion for assistance.   09/08/2019, 2:28 PM

## 2019-09-08 NOTE — Progress Notes (Signed)
Physical Therapy Evaluation Patient Details Name: Ana Washington MRN: [id number] DOB: 03-03-1945 Today's Date: 09/08/2019   History of Present Illness  Per MD note:Ana Washington is a 74 y.o. female with medical history significant of chronic respiratory failure, on home O2 (2L), due to bronchiectasis; HTN; and afib on Eliquis presenting with SOB.  She woke up about 2AM and was nauseated with cough, felt bad and O2 was low.  The oxygen tank didn't seem to be working and so she transitioned to the small tank and it still wouldn't get better.  She had a severe headache.  She has been feeling ok lately.  Yesterday afternoon she started feeling not as good - coughing and weak and a little nauseated.  No fever.  Clinical Impression  Patient agrees to PT evaluation. Pt reports no pain. Pt lives alone with a level entry into her home. Pt ambulated with RW prior to this hospital admission. Pt has 3/5 strength BLE hip and knee and is min a for bed mobility, min a for transfers sit to stand with RW. Patient is able to march in place in standing and RR increases to 35. Pt has WNL static sitting balance and fair static/dynamic standing balance and needs UE support with HHA. Pt will continue to benefit from skilled PT to improve mobility and strength.    Follow Up Recommendations Home health PT    Equipment Recommendations  None recommended by PT    Recommendations for Other Services       Precautions / Restrictions Precautions Precautions: None Restrictions Weight Bearing Restrictions: No      Mobility  Bed Mobility Overal bed mobility: Needs Assistance Bed Mobility: Supine to Sit;Sit to Supine     Supine to sit: Min assist Sit to supine: Min assist      Transfers Overall transfer level: Needs assistance Equipment used: 1 person hand held assist Transfers: Sit to/from Stand Sit to Stand: Min assist         General transfer comment: cues for safety with  lines  Ambulation/Gait Ambulation/Gait assistance:  (marching in place due to many lines in ER room)              Stairs            Wheelchair Mobility    Modified Rankin (Stroke Patients Only)       Balance Overall balance assessment: Needs assistance Sitting-balance support: Bilateral upper extremity supported;Feet supported Sitting balance-Leahy Scale: Good     Standing balance support: Single extremity supported Standing balance-Leahy Scale: Fair                               Pertinent Vitals/Pain Pain Assessment: No/denies pain    Home Living Family/patient expects to be discharged to:: Private residence Living Arrangements: Spouse/significant other Available Help at Discharge: Family Type of Home: House Home Access: Level entry     Home Layout: One level Home Equipment: Environmental consultant - 2 wheels;Bedside commode;Tub bench;Wheelchair - manual Additional Comments: Her sister lives close and takes care of her    Prior Function Level of Independence: Independent with assistive device(s)               Hand Dominance        Extremity/Trunk Assessment   Upper Extremity Assessment Upper Extremity Assessment: Overall WFL for tasks assessed    Lower Extremity Assessment Lower Extremity Assessment: Generalized weakness  Communication   Communication: No difficulties  Cognition Arousal/Alertness: Awake/alert Behavior During Therapy: WFL for tasks assessed/performed Overall Cognitive Status: Within Functional Limits for tasks assessed                                        General Comments      Exercises     Assessment/Plan    PT Assessment Patient needs continued PT services  PT Problem List Decreased strength;Decreased activity tolerance;Decreased balance;Decreased mobility;Cardiopulmonary status limiting activity       PT Treatment Interventions Gait training;Functional mobility training;Therapeutic  activities;Therapeutic exercise;Balance training    PT Goals (Current goals can be found in the Care Plan section)  Acute Rehab PT Goals Patient Stated Goal: to walk PT Goal Formulation: With patient Time For Goal Achievement: 09/22/19 Potential to Achieve Goals: Good    Frequency Min 2X/week   Barriers to discharge        Co-evaluation               AM-PAC PT "6 Clicks" Mobility  Outcome Measure Help needed turning from your back to your side while in a flat bed without using bedrails?: A Little Help needed moving from lying on your back to sitting on the side of a flat bed without using bedrails?: A Lot Help needed moving to and from a bed to a chair (including a wheelchair)?: A Lot Help needed standing up from a chair using your arms (e.g., wheelchair or bedside chair)?: A Little Help needed to walk in hospital room?: A Lot Help needed climbing 3-5 steps with a railing? : A Lot 6 Click Score: 14    End of Session Equipment Utilized During Treatment: Gait belt;Oxygen Activity Tolerance: Patient tolerated treatment well Patient left: in bed;with family/visitor present Nurse Communication: Mobility status PT Visit Diagnosis: Unsteadiness on feet (R26.81);Muscle weakness (generalized) (M62.81);Difficulty in walking, not elsewhere classified (R26.2)    Time: 5361-4431 PT Time Calculation (min) (ACUTE ONLY): 27 min   Charges:   PT Evaluation $PT Eval Low Complexity: 1 Low PT Treatments $Therapeutic Activity: 23-37 mins          [place], Sherryl Barters, PT DPT 09/08/2019, 3:12 PM

## 2019-09-08 NOTE — Consult Note (Signed)
Pharmacy Antibiotic Note  Ana Washington is a 74 y.o. female admitted on 09/08/2019 with a bronchiectasis exacerbation. Patient failed outpatient therapy with ciprofloxacin + azithromycin. Pharmacy has been consulted for ceftazidime and gentamicin dosing. This patient has a documented penicillin allergy but according to medical records received cefazolin in the past with no documented side-effects. Her renal function on admission is at what appears to be her baseline level.  Plan:    Ceftazidime 2 grams IV every 12 hours  Gentamicin 350 mg (7 mg/kg) IV x 1 --Will check random level with AM labs --Continue to monitor renal function  Height: 5\' 5"  (165.1 cm) Weight: 49.9 kg (110 lb) IBW/kg (Calculated) : 57  Temp (24hrs), Avg:98.8 F (37.1 C), Min:98.3 F (36.8 C), Max:99.3 F (37.4 C)  Recent Labs  Lab 09/08/19 0647 09/08/19 0650  WBC  --  17.5*  CREATININE  --  0.65  LATICACIDVEN 1.0  --     Estimated Creatinine Clearance: 47.9 mL/min (by C-G formula based on SCr of 0.65 mg/dL).    Allergies  Allergen Reactions  . Codeine Nausea And Vomiting  . Sulfa Antibiotics Diarrhea  . Penicillins Rash    Has patient had a PCN reaction causing immediate rash, facial/tongue/throat swelling, SOB or lightheadedness with hypotension: Unknown Has patient had a PCN reaction causing severe rash involving mucus membranes or skin necrosis: Unknown Has patient had a PCN reaction that required hospitalization: Unknown Has patient had a PCN reaction occurring within the last 10 years: No If all of the above answers are "NO", then may proceed with Cephalosporin use.     Antimicrobials this admission: vancomycin 8/8 x 1 cefepime 8/8 x 1 ceftazidime 8/8 >>  gentamicin 8/8 >>  Microbiology results: 8/8 BCx: pending 8/8 UCx: pending  8/8 SARS CoV-2: negative  8/8 Sputum Cx: pending 8/8 Respiratory panel: pending  Thank you for allowing pharmacy to be a part of this patient's care.  Benita Gutter 09/08/2019 6:14 PM

## 2019-09-08 NOTE — Consult Note (Signed)
PHARMACY -  BRIEF ANTIBIOTIC NOTE   Pharmacy has received consult(s) for vancomycin and cefepime from an ED provider.  The patient's profile has been reviewed for ht/wt/allergies/indication/available labs.  This patient has a documented penicillin allergy but according to medical records received cefazolin in the past with no documented side-effects  One time order(s) placed for:  1) cefepime 1 gram IV x 1  2) vancomycin 1000 mg x 1  Further antibiotics/pharmacy consults should be ordered by admitting physician if indicated.                       Thank you,  Dallie Piles 09/08/2019  7:44 AM

## 2019-09-08 NOTE — Consult Note (Signed)
CODE SEPSIS - PHARMACY COMMUNICATION  **Broad Spectrum Antibiotics should be administered within 1 hour of Sepsis diagnosis**  Time Code Sepsis Called/Page Received: 7654  Antibiotics Ordered: cefepime and vancomycin  Time of 1st antibiotic administration: 0756  Additional action taken by pharmacy: none required  If necessary, Name of Provider/Nurse Contacted: N/A    Dallie Piles ,PharmD Clinical Pharmacist  09/08/2019  7:48 AM

## 2019-09-09 LAB — EXPECTORATED SPUTUM ASSESSMENT W GRAM STAIN, RFLX TO RESP C

## 2019-09-09 LAB — RESPIRATORY PANEL BY PCR

## 2019-09-09 LAB — CBC
HCT: 36.6 % (ref 36.0–46.0)
Hemoglobin: 12.8 g/dL (ref 12.0–15.0)
MCH: 33.7 pg (ref 26.0–34.0)
MCHC: 35 g/dL (ref 30.0–36.0)
MCV: 96.3 fL (ref 80.0–100.0)
Platelets: 245 10*3/uL (ref 150–400)
RBC: 3.8 MIL/uL — ABNORMAL LOW (ref 3.87–5.11)
RDW: 11.2 % — ABNORMAL LOW (ref 11.5–15.5)
WBC: 9.4 10*3/uL (ref 4.0–10.5)
nRBC: 0 % (ref 0.0–0.2)

## 2019-09-09 LAB — BASIC METABOLIC PANEL
Anion gap: 6 (ref 5–15)
BUN: 16 mg/dL (ref 8–23)
CO2: 35 mmol/L — ABNORMAL HIGH (ref 22–32)
Calcium: 8.8 mg/dL — ABNORMAL LOW (ref 8.9–10.3)
Chloride: 93 mmol/L — ABNORMAL LOW (ref 98–111)
Creatinine, Ser: 0.65 mg/dL (ref 0.44–1.00)
GFR calc Af Amer: >60 mL/min (ref >60)
GFR calc non Af Amer: >60 mL/min (ref >60)
Glucose, Bld: 157 mg/dL — ABNORMAL HIGH (ref 70–99)
Potassium: 4.6 mmol/L (ref 3.5–5.1)
Sodium: 134 mmol/L — ABNORMAL LOW (ref 135–145)

## 2019-09-09 LAB — GENTAMICIN LEVEL, RANDOM
Gentamicin Rm: 11.3 ug/mL
Gentamicin Rm: 3.7 ug/mL

## 2019-09-09 MED ORDER — SENNOSIDES-DOCUSATE SODIUM 8.6-50 MG PO TABS
1.0000 | ORAL_TABLET | Freq: Every day | ORAL | Status: DC
  Administered 2019-09-09: 1 via ORAL
  Filled 2019-09-09: qty 1

## 2019-09-09 MED ORDER — SODIUM CHLORIDE 3 % IN NEBU
4.0000 mL | INHALATION_SOLUTION | Freq: Two times a day (BID) | RESPIRATORY_TRACT | Status: DC
  Administered 2019-09-10: 4 mL via RESPIRATORY_TRACT
  Filled 2019-09-09 (×3): qty 4

## 2019-09-09 MED ORDER — BOOST / RESOURCE BREEZE PO LIQD CUSTOM
1.0000 | Freq: Three times a day (TID) | ORAL | Status: DC
  Administered 2019-09-09 (×2): 1 via ORAL

## 2019-09-09 MED ORDER — SODIUM CHLORIDE 0.9 % IV SOLN
2.0000 g | Freq: Two times a day (BID) | INTRAVENOUS | Status: DC
  Administered 2019-09-09 – 2019-09-10 (×3): 2 g via INTRAVENOUS
  Filled 2019-09-09 (×5): qty 2

## 2019-09-09 MED ORDER — MAGNESIUM SULFATE 2 GM/50ML IV SOLN
2.0000 g | Freq: Once | INTRAVENOUS | Status: AC
  Administered 2019-09-09: 2 g via INTRAVENOUS
  Filled 2019-09-09: qty 50

## 2019-09-09 MED ORDER — LORAZEPAM 0.5 MG PO TABS: 0.5000 mg | ORAL_TABLET | Freq: Every day | ORAL | Status: DC

## 2019-09-09 MED ORDER — LORAZEPAM 0.5 MG PO TABS
0.5000 mg | ORAL_TABLET | Freq: Every day | ORAL | Status: DC
  Administered 2019-09-09: 0.5 mg via ORAL
  Filled 2019-09-09: qty 1

## 2019-09-09 NOTE — Progress Notes (Signed)
PIV consult: RN reports bleeding from site. Recommended applying pressure with 2x2 prior to cleansing during dressing change.

## 2019-09-09 NOTE — Progress Notes (Addendum)
OT Cancellation Note  Patient Details Name: Ana Washington MRN: [id number] DOB: January 28, 1946   Cancelled Treatment:    Reason Eval/Treat Not Completed: Other (comment). Consult received, chart reviewed. Upon attempt, RN leaving room and indicated pt was SOB and just received a breathing treatment. RN requesting therapy hold at this time. Will re-attempt at later date/time for OT evaluation as medically appropriate.   Addendum, 3:00pm: Spoke again with RN who states pt still not doing very well. Recommends holding OT for next date.    Jeni Salles, MPH, MS, OTR/L ascom [phone] 09/09/19, 1:40 PM

## 2019-09-09 NOTE — TOC Initial Note (Addendum)
Transition of Care [facility]) - Initial/Assessment Note    Patient Details  Name: Ana Washington MRN: [id number] Date of Birth: 11-28-1945  Transition of Care [facility]) CM/SW Contact:    Candie Chroman, LCSW Phone Number: 09/09/2019, 11:56 AM  Clinical Narrative: Patient on isolation precautions. CSW called patient in room, introduced role, and explained that PT recommendations would be discussed. Patient is agreeable to home health if her insurance covers it. Spoke with one home health representative who stated [facility] is the only agency in network with [employer]. [facility] representative is reviewing referral. No further concerns. CSW encouraged patient to contact CSW as needed. CSW will continue to follow patient for support and facilitate return home when stable.                 12:36 pm: [facility] is able to accept patient. She will not have copays. Patient is aware and agreeable. CSW inquired about her broken oxygen at home. She stated the supplier came out and fixed it on Sunday. Patient cannot remember the name of the oxygen supplier.   Expected Discharge Plan: [place] Barriers to Discharge: Continued Medical Work up   Patient Goals and CMS Choice        Expected Discharge Plan and Services Expected Discharge Plan: [place] Choice: [place] arrangements for the past 2 months: Single Family Home                                      Prior Living Arrangements/Services Living arrangements for the past 2 months: Single Family Home Lives with:: Self Patient language and need for interpreter reviewed:: Yes Do you feel safe going back to the place where you live?: Yes      Need for Family Participation in Patient Care: Yes (Comment) Care giver support system in place?: Yes (comment) Current home services: DME Criminal Activity/Legal Involvement Pertinent to Current Situation/Hospitalization: No - Comment as  needed  Activities of Daily Living Home Assistive Devices/Equipment: Walker (specify type), Dentures (specify type), Eyeglasses, Bedside commode/3-in-1, Shower chair without back, Blood pressure cuff ADL Screening (condition at time of admission) Patient's cognitive ability adequate to safely complete daily activities?: Yes Is the patient deaf or have difficulty hearing?: Yes Does the patient have difficulty seeing, even when wearing glasses/contacts?: Yes Does the patient have difficulty concentrating, remembering, or making decisions?: No Patient able to express need for assistance with ADLs?: Yes Does the patient have difficulty dressing or bathing?: Yes Independently performs ADLs?: No Communication: Independent Dressing (OT): Needs assistance Is this a change from baseline?: Pre-admission baseline Grooming: Independent Feeding: Independent Bathing: Needs assistance Is this a change from baseline?: Pre-admission baseline Toileting: Independent In/Out Bed: Independent Walks in Home: Independent with device (comment) (Walker with wheels) Does the patient have difficulty walking or climbing stairs?: Yes Weakness of Legs: Both Weakness of Arms/Hands: Both  Permission Sought/Granted Permission sought to share information with : Facility Art therapist granted to share information with : Yes, Verbal Permission Granted     Permission granted to share info w AGENCY: Home Health Agencies        Emotional Assessment Appearance:: Appears stated age Attitude/Demeanor/Rapport: Engaged, Gracious Affect (typically observed): Accepting, Appropriate, Calm, Pleasant Orientation: : Oriented to Self, Oriented to Place, Oriented to  Time, Oriented to Situation Alcohol / Substance Use: Not  Applicable Psych Involvement: No (comment)  Admission diagnosis:  Hypokalemia [E87.6] Bronchiectasis (HCC) [J47.9] Acute on chronic respiratory failure with hypoxia (HCC)  [J96.21] Pneumonia due to infectious organism, unspecified laterality, unspecified part of lung [J18.9] Sepsis, due to unspecified organism, unspecified whether acute organ dysfunction present [facility]) [A41.9] Patient Active Problem List   Diagnosis Date Noted  . Bronchiectasis ([place]) 09/08/2019  . Atrial fibrillation, chronic ([place]) 09/08/2019  . Closed right hip fracture ([place]) 09/27/2018  . Intractable nausea and vomiting 04/08/2018  . Atrial fibrillation with RVR ([place]) 04/08/2018  . Thoracic radiculitis (Bilateral) 03/29/2018  . Vasovagal episode 03/29/2018  . Closed compression fracture of T10 thoracic vertebra, sequela 03/29/2018  . Abnormal MRI, lumbar spine (12/15/2016) 03/21/2018  . Lumbar compression fractures, sequela (L1, L2, L3, L4, and L5) 03/21/2018  . Closed compression fracture of L1 lumbar vertebra, sequela 03/21/2018  . Closed compression fracture of L2 lumbar vertebra, sequela 03/21/2018  . Closed compression fracture of L3 lumbar vertebra, sequela 03/21/2018  . Closed compression fracture of L4 lumbar vertebra, sequela 03/21/2018  . Closed compression fracture of L5 lumbar vertebra, sequela 03/21/2018  . Thoracic compression fracture, sequela (T5, T9, T10, T11, and T12) 03/21/2018  . Closed compression fracture of T5 thoracic vertebra, sequela 03/21/2018  . Closed compression fracture of T9 thoracic vertebra, sequela 03/21/2018  . Close compression fracture of T11 thoracic vertebra, sequela 03/21/2018  . Closed compression fracture of T12 thoracic vertebra, sequela 03/21/2018  . Lumbar facet hypertrophy 03/21/2018  . Grade 1  Lumbar Anterolisthesis of L3/4 and L4/5 03/21/2018  . Lumbar central spinal stenosis (Multilevel), 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 claudication 03/21/2018  . Chronic anticoagulation (ELIQUIS) 03/21/2018  . History of pelvic fracture 03/21/2018  . Chronic musculoskeletal pain 03/21/2018  . Neurogenic pain 03/21/2018  . Long term prescription benzodiazepine use  03/21/2018  . 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 Adult bronchiectasis ([place]) 03/01/2018  . Diverticulitis 03/01/2018  . 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[place]) 03/01/2018  . Migraines 03/01/2018  . 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 Psoriasis 03/01/2018  . Chronic upper back pain (Primary Area of Pain) (Bilateral) (R>L) 03/01/2018  . Chronic low back pain (Secondary Area of Pain) (Bilateral) (R>L) w/o sciatica 03/01/2018  . Chronic pain syndrome 03/01/2018  . Long term current use of opiate analgesic 03/01/2018  . Pharmacologic therapy 03/01/2018  . Disorder of skeletal system 03/01/2018  . Problems influencing health status 03/01/2018  . History of kyphoplasty (L1, L2, L3, T9, T11, and T12) 01/05/2018  . Protein-calorie malnutrition, severe 08/03/2017  . GERD (gastroesophageal reflux disease) 07/30/2017  . Pelvic fracture ([place]) 07/30/2017  . Paroxysmal atrial fibrillation ([place]) 07/30/2017  . Other dysphagia 09/13/2016  . Unintended weight loss 09/13/2016  . Essential hypertension 10/03/2013  . Osteoarthritis of knees (Bilateral) 10/03/2013  . Primary localized osteoarthrosis, lower leg 10/03/2013  . Non-ischemic cardiomyopathy ([place]) 09/12/2013  . Coronary artery disease involving native coronary artery of native heart without angina pectoris 07/28/2012  . Hyperlipidemia, mixed 07/28/2012  . DDD (degenerative disc disease), lumbar 02/29/2012   PCP:  [name], MD Pharmacy:   [facility] [place], [place] [address] [place] [address] Phone: [phone] Fax: [phone]     Social Determinants of Health ([place]) Interventions    Readmission Risk Interventions Readmission Risk Prevention Plan 01/06/2018  Transportation Screening Complete  PCP or Specialist Appt within 5-7 Days Complete  Home  Care Screening Complete  Medication Review (RN CM) Complete  Some recent data might be hidden

## 2019-09-09 NOTE — Progress Notes (Signed)
PROGRESS NOTE    Ana Washington  NID:[id number] DOB: Nov 26, 1945 DOA: 09/08/2019 PCP: [name], MD    Assessment & Plan:   Principal Problem:   Bronchiectasis [facility]) Active Problems:   Essential hypertension   Hyperlipidemia, mixed   Chronic pain syndrome   Chronic anticoagulation (ELIQUIS)   Atrial fibrillation, chronic (HCC)    Ana Washington is a 74 y.o. female with medical history significant of chronic respiratory failure, on home O2 (2L), due to bronchiectasis; HTN; and afib on Eliquis presenting with SOB.  She woke up about 2AM and was nauseated with cough, felt bad and O2 was low.     Acute on chronic respiratory failure 2/2 Bronchiectasis exacerbation --Complicated by severe GERD due to large hiatal hernia with microaspiration. -on home 2L at baseline -afebrile but positive leukocytosis on presentation.  -Chest x-ray was not consistent with pneumonia --Pt follows with Dr. Keenan Bachelor outpatient, who is currently seeing pt as inpatient consult --RVP neg, procal neg PLAN: --Per pulm rec: --Solu-Medrol 60 twice daily will transition to 40 twice daily tomorrow -Codeine Hycodan 5 mL every 8 hours -PT OT when possible -Chest physiotherapy with [facility] for twice daily -Mucomyst 4 mL twice daily -Protonix 40 twice daily IV -Continue TOBI, Continue Gentamycin, stopped Tressie Ellis   HTN -Continue Norvasc, Cozaar, Toprol XL --continue home cardizem  HLD -She does not appear to be taking medications for this issue at this time  Afib -Rate controlled --continue home Cardizem and Toprol -Eliquis for [facility]  Chronic pain -Continue Neurontin -I have reviewed this patient in the [place] Controlled Substances Reporting System.  She is receiving medications from only one provider and appears to be taking them as prescribed. -She is not at particularly high risk of opioid misuse, diversion, or overdose.  Malnutrition -The patient's BMI is Body mass index is 18.3 kg/m..    -The patient has at least 2 indicators for malnutrition (insufficient energy intake, weight loss, loss of muscle mass, loss of subcutaneous fat).  -This is likely due to starvation related/chronic disease -Will obtain a nutrition consult for further recommendations.  Anxiety --continue home Ativan nightly   DVT prophylaxis: RW:ERXVQMG Code Status: DNR  Family Communication:  Status is: inpatient Dispo:   The patient is from: home Anticipated d/c is to: home Anticipated d/c date is: 1-2 days Patient currently is not medically stable to d/c due to: still on IV solumedrol, IV abx, need pulm clearance.   Subjective and Interval History:  Pt had intermittent bouts of dyspnea.  No fever.  No appetite.   Objective: Vitals:   09/09/19 0752 09/09/19 1032 09/09/19 1138 09/09/19 1330  BP: 132/87 127/72 124/72   Pulse: 72 83 69   Resp: 14 16 14    Temp: 97.7 F (36.5 C) 98.3 F (36.8 C) 97.9 F (36.6 C)   TempSrc: Oral Oral Oral   SpO2: 100% 100% 100% 98%  Weight:      Height:        Intake/Output Summary (Last 24 hours) at 09/09/2019 1737 Last data filed at 09/09/2019 1604 Gross per 24 hour  Intake 1717.74 ml  Output 400 ml  Net 1317.74 ml   Filed Weights   09/08/19 0644  Weight: 49.9 kg    Examination:   Constitutional: NAD, AAOx3, talkative HEENT: conjunctivae and lids normal, EOMI CV: RRR no M,R,G. Distal pulses +2.  No cyanosis.   RESP: CTA B/L over anterior, normal respiratory effort, on 2L GI: +BS, NTND Extremities: No effusions, edema, or tenderness in  BLE SKIN: warm, dry and intact Neuro: II - XII grossly intact.  Sensation intact Psych: Normal mood and affect.  Appropriate judgement and reason   Data Reviewed: I have personally reviewed following labs and imaging studies  CBC: Recent Labs  Lab 09/08/19 0650 09/09/19 0417  WBC 17.5* 9.4  NEUTROABS 12.4*  --   HGB 12.8 12.8  HCT 38.3 36.6  MCV 96.7 96.3  PLT 306 644   Basic Metabolic  Panel: Recent Labs  Lab 09/08/19 0650 09/09/19 0417  NA 138 134*  K 2.9* 4.6  CL 92* 93*  CO2 37* 35*  GLUCOSE 95 157*  BUN 11 16  CREATININE 0.65 0.65  CALCIUM 8.8* 8.8*  MG 1.5*  --    GFR: Estimated Creatinine Clearance: 47.9 mL/min (by C-G formula based on SCr of 0.65 mg/dL). Liver Function Tests: Recent Labs  Lab 09/08/19 0650  AST 18  ALT 9  ALKPHOS 38  BILITOT 0.8  PROT 6.2*  ALBUMIN 3.6   No results for input(s): LIPASE, AMYLASE in the last 168 hours. No results for input(s): AMMONIA in the last 168 hours. Coagulation Profile: No results for input(s): INR, PROTIME in the last 168 hours. Cardiac Enzymes: No results for input(s): CKTOTAL, CKMB, CKMBINDEX, TROPONINI in the last 168 hours. BNP (last 3 results) No results for input(s): PROBNP in the last 8760 hours. HbA1C: No results for input(s): HGBA1C in the last 72 hours. CBG: No results for input(s): GLUCAP in the last 168 hours. Lipid Profile: No results for input(s): CHOL, HDL, LDLCALC, TRIG, CHOLHDL, LDLDIRECT in the last 72 hours. Thyroid Function Tests: No results for input(s): TSH, T4TOTAL, FREET4, T3FREE, THYROIDAB in the last 72 hours. Anemia Panel: No results for input(s): VITAMINB12, FOLATE, FERRITIN, TIBC, IRON, RETICCTPCT in the last 72 hours. Sepsis Labs: Recent Labs  Lab 09/08/19 0647 09/08/19 1828  PROCALCITON  --  <0.10  LATICACIDVEN 1.0  --     Recent Results (from the past 240 hour(s))  SARS Coronavirus 2 by RT PCR (hospital order, performed in [facility] hospital lab) Nasopharyngeal Nasopharyngeal Swab     Status: None   Collection Time: 09/08/19  7:50 AM   Specimen: Nasopharyngeal Swab  Result Value Ref Range Status   SARS Coronavirus 2 NEGATIVE NEGATIVE Final    Comment: (NOTE) SARS-CoV-2 target nucleic acids are NOT DETECTED.  The SARS-CoV-2 RNA is generally detectable in upper and lower respiratory specimens during the acute phase of infection. The lowest concentration  of SARS-CoV-2 viral copies this assay can detect is 250 copies / mL. A negative result does not preclude SARS-CoV-2 infection and should not be used as the sole basis for treatment or other patient management decisions.  A negative result may occur with improper specimen collection / handling, submission of specimen other than nasopharyngeal swab, presence of viral mutation(s) within the areas targeted by this assay, and inadequate number of viral copies (<250 copies / mL). A negative result must be combined with clinical observations, patient history, and epidemiological information.  Fact Sheet for Patients:   StrictlyIdeas.no  Fact Sheet for Healthcare Providers: BankingDealers.co.za  This test is not yet approved or  cleared by the [place] FDA and has been authorized for detection and/or diagnosis of SARS-CoV-2 by FDA under an Emergency Use Authorization (EUA).  This EUA will remain in effect (meaning this test can be used) for the duration of the COVID-19 declaration under Section 564(b)(1) of the Act, 21 U.S.C. section 360bbb-3(b)(1), unless the authorization is terminated  or revoked sooner.  Performed at [facility], [place] [place], [place] [address]   Blood culture (routine x 2)     Status: None (Preliminary result)   Collection Time: 09/08/19  7:50 AM   Specimen: BLOOD  Result Value Ref Range Status   Specimen Description BLOOD LEFT ANTECUBITAL  Final   Special Requests   Final    BOTTLES DRAWN AEROBIC AND ANAEROBIC Blood Culture adequate volume   Culture   Final    NO GROWTH < 24 HOURS Performed at [facility], [address] [place], [place] [address]    Report Status PENDING  Incomplete  Blood culture (routine x 2)     Status: None (Preliminary result)   Collection Time: 09/08/19  7:50 AM   Specimen: BLOOD  Result Value Ref Range Status   Specimen Description BLOOD BLOOD LEFT  FOREARM  Final   Special Requests   Final    BOTTLES DRAWN AEROBIC AND ANAEROBIC Blood Culture adequate volume   Culture   Final    NO GROWTH < 24 HOURS Performed at [facility], [address], [place], [place] [address]    Report Status PENDING  Incomplete  Expectorated sputum assessment w rflx to resp cult     Status: None   Collection Time: 09/08/19  1:15 PM   Specimen: Sputum  Result Value Ref Range Status   Specimen Description SPUTUM  Final   Special Requests NONE  Final   Sputum evaluation   Final    Sputum specimen not acceptable for testing.  Please recollect.   CALLED TO ERICA JOHNSON AT [phone] ON 09/09/19 BY SS Performed at [facility], [address] [place], [place] [address]    Report Status 09/09/2019 FINAL  Final  CULTURE, BLOOD (ROUTINE X 2) w Reflex to ID Panel     Status: None (Preliminary result)   Collection Time: 09/08/19  6:29 PM   Specimen: BLOOD  Result Value Ref Range Status   Specimen Description BLOOD RIGHT [facility]  Final   Special Requests   Final    BOTTLES DRAWN AEROBIC AND ANAEROBIC Blood Culture results may not be optimal due to an excessive volume of blood received in culture bottles   Culture   Final    NO GROWTH < 12 HOURS Performed at [facility], [address], [place], [place] [address]    Report Status PENDING  Incomplete  CULTURE, BLOOD (ROUTINE X 2) w Reflex to ID Panel     Status: None (Preliminary result)   Collection Time: 09/08/19  6:37 PM   Specimen: BLOOD  Result Value Ref Range Status   Specimen Description BLOOD LEFT HAND  Final   Special Requests   Final    BOTTLES DRAWN AEROBIC AND ANAEROBIC Blood Culture adequate volume   Culture   Final    NO GROWTH < 12 HOURS Performed at [facility], [address] [place], [place] [address]    Report Status PENDING  Incomplete  Respiratory Panel by PCR     Status: None   Collection Time: 09/08/19  7:46 PM   Specimen: Nasopharyngeal Swab;  Respiratory  Result Value Ref Range Status   Adenovirus NOT DETECTED NOT DETECTED Final   Coronavirus 229E NOT DETECTED NOT DETECTED Final    Comment: (NOTE) The Coronavirus on the Respiratory Panel, DOES NOT test for the novel  Coronavirus (2019 nCoV)    Coronavirus HKU1 NOT DETECTED NOT DETECTED Final  Coronavirus NL63 NOT DETECTED NOT DETECTED Final   Coronavirus OC43 NOT DETECTED NOT DETECTED Final   Metapneumovirus NOT DETECTED NOT DETECTED Final   Rhinovirus / Enterovirus NOT DETECTED NOT DETECTED Final   Influenza A NOT DETECTED NOT DETECTED Final   Influenza B NOT DETECTED NOT DETECTED Final   Parainfluenza Virus 1 NOT DETECTED NOT DETECTED Final   Parainfluenza Virus 2 NOT DETECTED NOT DETECTED Final   Parainfluenza Virus 3 NOT DETECTED NOT DETECTED Final   Parainfluenza Virus 4 NOT DETECTED NOT DETECTED Final   Respiratory Syncytial Virus NOT DETECTED NOT DETECTED Final   Bordetella pertussis NOT DETECTED NOT DETECTED Final   Chlamydophila pneumoniae NOT DETECTED NOT DETECTED Final   Mycoplasma pneumoniae NOT DETECTED NOT DETECTED Final    Comment: Performed at [facility], [place] [address] [place],  [address]      Radiology Studies: DG Chest 1 View  Result Date: 09/08/2019 CLINICAL DATA:  Shortness of breath, cough EXAM: CHEST  1 VIEW COMPARISON:  Chest radiograph dated 10/07/2018. CT chest dated 09/28/2018. FINDINGS: Postsurgical changes in the right upper lobe, right perihilar region, and left mid lung. Chronic interstitial markings in the lungs bilaterally, with suspected bronchiectasis, better evaluated on prior CT. Left lower lobe opacity, likely reflecting atelectasis with associated eventration of left hemidiaphragm when correlating with prior CT. The heart is normal in size. Multilevel thoracolumbar vertebral augmentation. Cholecystectomy clips. IMPRESSION: No evidence of acute cardiopulmonary disease. Chronic and postprocedural changes, as above.  Electronically Signed   By: Julian Hy M.D.   On: 09/08/2019 07:52     Scheduled Meds: . acetylcysteine  600 mg Oral QPC breakfast  . acidophilus  1 capsule Oral Daily  . amLODipine  5 mg Oral Daily  . apixaban  5 mg Oral BID  . chlorpheniramine-HYDROcodone  5 mL Oral QHS  . diltiazem  120 mg Oral Daily  . feeding supplement  1 Container Oral TID BM  . gabapentin  300 mg Oral QID  . LORazepam  0.5 mg Oral Q4H  . losartan  100 mg Oral Daily  . methylPREDNISolone (SOLU-MEDROL) injection  60 mg Intravenous Q12H  . metoprolol succinate  100 mg Oral Daily  . pantoprazole (PROTONIX) IV  40 mg Intravenous Q12H  . sodium chloride flush  3 mL Intravenous Q12H  . [START ON 09/10/2019] sodium chloride HYPERTONIC  4 mL Inhalation BID  . tobramycin (PF)  300 mg Nebulization BID   Continuous Infusions: . cefTAZidime (FORTAZ)  IV 2 g (09/09/19 1604)  . lactated ringers Stopped (09/09/19 1604)     LOS: 1 day     Enzo Bi, MD [facility] If 7PM-7AM, please contact night-coverage 09/09/2019, 5:37 PM

## 2019-09-09 NOTE — Progress Notes (Addendum)
Initial Nutrition Assessment  DOCUMENTATION CODES:   Non-severe (moderate) malnutrition in context of social or environmental circumstances  INTERVENTION:   Boost Breeze po TID, each supplement provides 250 kcal and 9 grams of protein  Liberalize diet   NUTRITION DIAGNOSIS:   Moderate Malnutrition related to social / environmental circumstances (chronic poor appetite, bronchiectasis) as evidenced by mild fat depletion, moderate muscle depletion, severe muscle depletion.  GOAL:   Patient will meet greater than or equal to 90% of their needs  MONITOR:   PO intake, Supplement acceptance, Labs, Weight trends, Skin, I & O's  REASON FOR ASSESSMENT:   Consult Assessment of nutrition requirement/status  ASSESSMENT:   74 y.o. female with past medical history of arthritis, A. fib, CAD, HTN, psoriasis, type IV hiatal hernia, GERD, NSTEMI, PUD, dumping syndrome and chronic hypoxic respiratory failure secondary to bronchiectasis/MAC s/p left lower lobe resection on chronic Cipro who presents for assessment of shortness of breath associated with nausea   Met with pt in room today. Pt is well known to this RD from multiple previous admits. Pt with decreased appetite and oral intake at baseline; pt eats small meals. Pt does not really like supplements but is willing to try orange Boost after some encouragement. Pt's main complaint is not being able to have salt; RD will liberalize pt's diet and restrict salt packs from trays. Pt eating 25% of meals in hospital. Per chart, pt is weight stable pta.   Medications reviewed and include: risaquad, solu-medrol, protonix, LRS _0 /hr, Mg sulfate   Labs reviewed: Na 134(L)  NUTRITION - FOCUSED PHYSICAL EXAM:    Most Recent Value  Orbital Region Mild depletion  Upper Arm Region Mild depletion  Thoracic and Lumbar Region Mild depletion  Buccal Region Mild depletion  Temple Region Mild depletion  Clavicle Bone Region Moderate depletion  Clavicle  and Acromion Bone Region Moderate depletion  Scapular Bone Region Moderate depletion  Dorsal Hand Severe depletion  Patellar Region Severe depletion  Anterior Thigh Region Severe depletion  Posterior Calf Region Severe depletion  Edema (RD Assessment) None  Hair Reviewed  Eyes Reviewed  Mouth Reviewed  Skin Reviewed  Nails Reviewed     Diet Order:   Diet Order            Diet regular Room service appropriate? Yes; Fluid consistency: Thin  Diet effective now                EDUCATION NEEDS:   Education needs have been addressed  Skin:  Skin Assessment: Reviewed RN Assessment (ecchymosis)  Last BM:  PTA  Height:   Ht Readings from Last 1 Encounters:  09/08/19 _1  (1.651 m)    Weight:   Wt Readings from Last 1 Encounters:  09/08/19 49.9 kg    Ideal Body Weight:  56.8 kg  BMI:  Body mass index is 18.3 kg/m.  Estimated Nutritional Needs:   Kcal:  1400-1600kcal/day  Protein:  70-80g/day  Fluid:  >1.3L/day  Koleen Distance MS, RD, LDN Please refer to [facility] for RD and/or RD on-call/weekend/after hours pager

## 2019-09-09 NOTE — Progress Notes (Signed)
Late entry: Around 11:30 or 12:00 the patient said she was starting to feel more short of breath and wheezing. She said this happens at home at times after receiving her medications. Respiratory therapy notified and gave the patient a PRN SVN. Patient did get some relief. Dr Billie Ruddy was notified. Order received for stool softener per the patients request

## 2019-09-09 NOTE — Progress Notes (Signed)
Pulmonary Medicine          Date: 09/09/2019,   MRN# [id number] Ana Washington 1945-09-26     AdmissionWeight: 49.9 kg                 CurrentWeight: 49.9 kg   Referring physician: Dr. Lorin Mercy   CHIEF COMPLAINT:   Acute exacerbation of bronchiectasis   SUBJECTIVE   Patient feels significantly improved. She should be able to d/c home tommorow 09/10/19.   Reviewed care plan with CM, no need for NIV at this time and patient does have Aff[employer] vest therapy at home.    PAST MEDICAL HISTORY   Past Medical History:  Diagnosis Date  . Arthritis   . Atrial fibrillation ([place])   . Bronchiectasis ([place])   . CAD (coronary artery disease) 07/30/2017  . Dumping syndrome   . 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 Family history of adverse reaction to anesthesia    sister [name]  . GERD (gastroesophageal reflux disease)   . Headache    MIGRAINES  . Myocardial infarction ([place]) 2007   Non-STEMI  . [name] (postoperative nausea and vomiting)   . Psoriasis   . PUD (peptic ulcer disease)      SURGICAL HISTORY   Past Surgical History:  Procedure Laterality Date  . BACK SURGERY    . CHOLECYSTECTOMY    . ESOPHAGOGASTRODUODENOSCOPY (EGD) WITH PROPOFOL N/A 03/19/2019   Procedure: ESOPHAGOGASTRODUODENOSCOPY (EGD) WITH PROPOFOL;  Surgeon: [name], MD;  Location: [facility] ENDOSCOPY;  Service: Gastroenterology;  Laterality: N/A;  *Note to anesthesia: Per pt's pulmonologist, if intubating, please extubate to BIPAP.  [name] EYE SURGERY    . FOOT SURGERY    . INTRAMEDULLARY (IM) NAIL INTERTROCHANTERIC Right 09/30/2018   Procedure: INTRAMEDULLARY (IM) NAIL INTERTROCHANTRIC;  Surgeon: Dereck Leep, MD;  Location: [facility] ORS;  Service: Orthopedics;  Laterality: Right;  . KYPHOPLASTY N/A 07/05/2016   Procedure: KYPHOPLASTY T - 9;  Surgeon: Hessie Knows, MD;  Location: [facility] ORS;  Service: Orthopedics;  Laterality: N/A;  . KYPHOPLASTY N/A 11/29/2017   Procedure: Iona Hansen;  Surgeon:  Hessie Knows, MD;  Location: [facility] ORS;  Service: Orthopedics;  Laterality: N/A;  L2 and L3  . KYPHOPLASTY N/A 12/18/2017   Procedure: KYPHOPLASTY L1;  Surgeon: Hessie Knows, MD;  Location: [facility] ORS;  Service: Orthopedics;  Laterality: N/A;  . KYPHOPLASTY N/A 01/05/2018   Procedure: KYPHOPLASTY-T11,T12;  Surgeon: Hessie Knows, MD;  Location: [facility] ORS;  Service: Orthopedics;  Laterality: N/A;  . KYPHOPLASTY N/A 04/05/2018   Procedure: T10 KYPHOPLASTY;  Surgeon: Hessie Knows, MD;  Location: [facility] ORS;  Service: Orthopedics;  Laterality: N/A;  . KYPHOPLASTY N/A 04/12/2018   Procedure: KYPHOPLASTY T7,8;  Surgeon: Hessie Knows, MD;  Location: [facility] ORS;  Service: Orthopedics;  Laterality: N/A;  . KYPHOPLASTY N/A 04/19/2018   Procedure: KYPHOPLASTY T5, T6;  Surgeon: Hessie Knows, MD;  Location: [facility] ORS;  Service: Orthopedics;  Laterality: N/A;  . LUNG SURGERY  1990 and 1996  . THOROCOTOMY WITH LOBECTOMY     LEFT LOWER THORACOTOMY / RIGHT MIDDLE LOBECTOMY     FAMILY HISTORY   Family History  Problem Relation Age of Onset  . Hypertension Mother   . Hypertension Father      SOCIAL HISTORY   Social History   Tobacco Use  . Smoking status: Never Smoker  . Smokeless tobacco: Never Used  Vaping Use  . Vaping Use: Never used  Substance Use Topics  . Alcohol use: No  . Drug  use: No     MEDICATIONS    Home Medication:    Current Medication:  Current Facility-Administered Medications:  .  acetylcysteine (MUCOMYST) 20 % nebulizer / oral solution 600 mg, 600 mg, Oral, QPC breakfast, Karmen Bongo, MD .  acidophilus (RISAQUAD) capsule 1 capsule, 1 capsule, Oral, Daily, Karmen Bongo, MD .  albuterol (PROVENTIL) (2.5 MG/3ML) 0.083% nebulizer solution 2.5 mg, 2.5 mg, Nebulization, Q2H PRN, Karmen Bongo, MD .  amLODipine (NORVASC) tablet 5 mg, 5 mg, Oral, Daily, Karmen Bongo, MD, 5 mg at 09/08/19 1226 .  apixaban (ELIQUIS) tablet 5 mg, 5 mg, Oral, BID, Karmen Bongo, MD, 5 mg  at 09/08/19 2152 .  chlorpheniramine-HYDROcodone (TUSSIONEX) 10-8 MG/5ML suspension 5 mL, 5 mL, Oral, QHS, Karmen Bongo, MD, 5 mL at 09/08/19 2153 .  diltiazem (CARDIZEM CD) 24 hr capsule 120 mg, 120 mg, Oral, Daily, Karmen Bongo, MD .  gabapentin (NEURONTIN) capsule 300 mg, 300 mg, Oral, QID, Karmen Bongo, MD, 300 mg at 09/08/19 2152 .  lactated ringers infusion, , Intravenous, Continuous, Willie Loy, MD, Last Rate: 30 mL/hr at 09/08/19 2254, Restarted at 09/08/19 2254 .  LORazepam (ATIVAN) tablet 0.5 mg, 0.5 mg, Oral, Q4H, Karmen Bongo, MD, 0.5 mg at 09/08/19 2159 .  losartan (COZAAR) tablet 100 mg, 100 mg, Oral, Daily, Karmen Bongo, MD .  methylPREDNISolone sodium succinate (SOLU-MEDROL) 125 mg/2 mL injection 60 mg, 60 mg, Intravenous, Q12H, Karmen Bongo, MD, 60 mg at 09/08/19 2159 .  metoprolol succinate (TOPROL-XL) 24 hr tablet 100 mg, 100 mg, Oral, Daily, Karmen Bongo, MD .  pantoprazole (PROTONIX) injection 40 mg, 40 mg, Intravenous, Q12H, Ottie Glazier, MD, 40 mg at 09/08/19 2153 .  sodium chloride flush (NS) 0.9 % injection 3 mL, 3 mL, Intravenous, Q12H, Karmen Bongo, MD, 3 mL at 09/08/19 2159 .  sodium chloride HYPERTONIC 3 % nebulizer solution 4 mL, 4 mL, Inhalation, TID, Karmen Bongo, MD, 4 mL at 09/08/19 2200 .  tobramycin (PF) (TOBI) nebulizer solution 300 mg, 300 mg, Nebulization, BID, Lanney Gins, Anetta Olvera, MD, 300 mg at 09/09/19 0741 .  traMADol (ULTRAM) tablet 50-100 mg, 50-100 mg, Oral, Q4H PRN, Karmen Bongo, MD    ALLERGIES   Codeine, Sulfa antibiotics, and Penicillins     REVIEW OF SYSTEMS    Review of Systems:  Gen:  Denies  fever, sweats, chills weigh loss  HEENT: Denies blurred vision, double vision, ear pain, eye pain, hearing loss, nose bleeds, sore throat Cardiac:  No dizziness, chest pain or heaviness, chest tightness,edema Resp:   Denies cough or sputum porduction, shortness of breath,wheezing, hemoptysis,  Gi: Denies  swallowing difficulty, stomach pain, nausea or vomiting, diarrhea, constipation, bowel incontinence Gu:  Denies bladder incontinence, burning urine Ext:   Denies Joint pain, stiffness or swelling Skin: Denies  skin rash, easy bruising or bleeding or hives Endoc:  Denies polyuria, polydipsia , polyphagia or weight change Psych:   Denies depression, insomnia or hallucinations   Other:  All other systems negative   VS: BP 132/87   Pulse 72   Temp 97.7 F (36.5 C) (Oral)   Resp 14   Ht 5\' 5"  (1.651 m)   Wt 49.9 kg   SpO2 100%   BMI 18.30 kg/m      PHYSICAL EXAM    GENERAL:NAD, no fevers, chills, no weakness no fatigue HEAD: Normocephalic, atraumatic.  EYES: Pupils equal, round, reactive to light. Extraocular muscles intact. No scleral icterus.  MOUTH: Moist mucosal membrane. Dentition intact. No abscess noted.  EAR, NOSE, THROAT:  Clear without exudates. No external lesions.  NECK: Supple. No thyromegaly. No nodules. No JVD.  PULMONARY: Bilateral rhonchorous breath sounds with wheezing CARDIOVASCULAR: S1 and S2. Regular rate and rhythm. No murmurs, rubs, or gallops. No edema. Pedal pulses 2+ bilaterally.  GASTROINTESTINAL: Soft, nontender, nondistended. No masses. Positive bowel sounds. No hepatosplenomegaly.  MUSCULOSKELETAL: No swelling, clubbing, or edema. Range of motion full in all extremities.  NEUROLOGIC: Cranial nerves II through XII are intact. No gross focal neurological deficits. Sensation intact. Reflexes intact.  SKIN: No ulceration, lesions, rashes, or cyanosis. Skin warm and dry. Turgor intact.  PSYCHIATRIC: Mood, affect within normal limits. The patient is awake, alert and oriented x 3. Insight, judgment intact.       IMAGING    DG Chest 1 View  Result Date: 09/08/2019 CLINICAL DATA:  Shortness of breath, cough EXAM: CHEST  1 VIEW COMPARISON:  Chest radiograph dated 10/07/2018. CT chest dated 09/28/2018. FINDINGS: Postsurgical changes in the right upper lobe,  right perihilar region, and left mid lung. Chronic interstitial markings in the lungs bilaterally, with suspected bronchiectasis, better evaluated on prior CT. Left lower lobe opacity, likely reflecting atelectasis with associated eventration of left hemidiaphragm when correlating with prior CT. The heart is normal in size. Multilevel thoracolumbar vertebral augmentation. Cholecystectomy clips. IMPRESSION: No evidence of acute cardiopulmonary disease. Chronic and postprocedural changes, as above. Electronically Signed   By: Julian Hy M.D.   On: 09/08/2019 07:52      ASSESSMENT/PLAN   Acute exacerbation of bronchiectasis -Complicated by severe GERD due to large hiatal hernia with microaspiration -Agree with Solu-Medrol 60 twice daily will transition to 40 twice daily tomorrow -Respiratory viral panel -Pro calcitonin trend -D-dimer -Codeine Hycodan 5 mL every 8 hours -PT OT when possible  -Blood cultures x2 -Chest physiotherapy with MetaNeb for twice daily -Mucomyst 4 mL twice daily -Protonix 40 twice daily IV -Continue TOBI, Continue Gentamycin, stopped Tressie Ellis    Thank you for allowing me to participate in the care of this patient.   Patient/Family are satisfied with care plan and all questions have been answered.  This document was prepared using Dragon voice recognition software and may include unintentional dictation errors.     Ottie Glazier, M.D.  Division of [place]

## 2019-09-10 LAB — URINE CULTURE: Culture: NO GROWTH

## 2019-09-10 LAB — BASIC METABOLIC PANEL
Anion gap: 9 (ref 5–15)
BUN: 17 mg/dL (ref 8–23)
CO2: 35 mmol/L — ABNORMAL HIGH (ref 22–32)
Calcium: 8.9 mg/dL (ref 8.9–10.3)
Chloride: 92 mmol/L — ABNORMAL LOW (ref 98–111)
Creatinine, Ser: 0.52 mg/dL (ref 0.44–1.00)
GFR calc Af Amer: >60 mL/min (ref >60)
GFR calc non Af Amer: >60 mL/min (ref >60)
Glucose, Bld: 134 mg/dL — ABNORMAL HIGH (ref 70–99)
Potassium: 4.1 mmol/L (ref 3.5–5.1)
Sodium: 136 mmol/L (ref 135–145)

## 2019-09-10 LAB — CBC
HCT: 34.7 % — ABNORMAL LOW (ref 36.0–46.0)
Hemoglobin: 11.6 g/dL — ABNORMAL LOW (ref 12.0–15.0)
MCH: 33.4 pg (ref 26.0–34.0)
MCHC: 33.4 g/dL (ref 30.0–36.0)
MCV: 100 fL (ref 80.0–100.0)
Platelets: 264 10*3/uL (ref 150–400)
RBC: 3.47 MIL/uL — ABNORMAL LOW (ref 3.87–5.11)
RDW: 11.6 % (ref 11.5–15.5)
WBC: 17.8 10*3/uL — ABNORMAL HIGH (ref 4.0–10.5)
nRBC: 0 % (ref 0.0–0.2)

## 2019-09-10 LAB — MAGNESIUM: Magnesium: 2.2 mg/dL (ref 1.7–2.4)

## 2019-09-10 MED ORDER — PREDNISONE 10 MG PO TABS: ORAL_TABLET | ORAL | 0 refills | Status: DC

## 2019-09-10 MED ORDER — AZITHROMYCIN 250 MG PO TABS: 250.0000 mg | ORAL_TABLET | Freq: Every day | ORAL | 0 refills | Status: AC

## 2019-09-10 MED ORDER — HYDROCODONE-HOMATROPINE 5-1.5 MG/5ML PO SYRP: 5.0000 mL | ORAL_SOLUTION | Freq: Three times a day (TID) | ORAL | 0 refills | Status: DC | PRN

## 2019-09-10 MED ORDER — LORAZEPAM 0.5 MG PO TABS: 0.5000 mg | ORAL_TABLET | Freq: Every day | ORAL | Status: AC

## 2019-09-10 MED ORDER — PREDNISONE 20 MG PO TABS
50.0000 mg | ORAL_TABLET | Freq: Every day | ORAL | Status: DC
  Administered 2019-09-10: 50 mg via ORAL
  Filled 2019-09-10: qty 3

## 2019-09-10 MED ORDER — PREDNISONE 10 MG PO TABS
10.0000 mg | ORAL_TABLET | Freq: Every day | ORAL | 0 refills | Status: DC
Start: 2019-09-10 — End: 2019-09-10

## 2019-09-10 MED ORDER — TOBRAMYCIN 300 MG/5ML IN NEBU: 300.0000 mg | INHALATION_SOLUTION | Freq: Two times a day (BID) | RESPIRATORY_TRACT | 0 refills | Status: AC

## 2019-09-10 MED ORDER — ALBUTEROL SULFATE (2.5 MG/3ML) 0.083% IN NEBU: 2.5000 mg | INHALATION_SOLUTION | Freq: Four times a day (QID) | RESPIRATORY_TRACT | 0 refills | Status: DC | PRN

## 2019-09-10 NOTE — TOC Transition Note (Signed)
Transition of Care [facility]) - CM/SW Discharge Note   Patient Details  Name: Ana Washington MRN: [id number] Date of Birth: Feb 26, 1945  Transition of Care [facility]) CM/SW Contact:  Candie Chroman, LCSW Phone Number: 09/10/2019, 9:43 AM   Clinical Narrative:  Patient has orders to discharge home today. [facility] representative is aware. They are able to provide PT, OT, and nursing. Home health orders are in. No further concerns. CSW signing off.   Final next level of care: [place] Barriers to Discharge: Barriers Resolved   Patient Goals and CMS Choice     Choice offered to / list presented to : Patient  Discharge Placement                    Patient and family notified of of transfer: 09/10/19  Discharge Plan and Services     Post Acute Care Choice: Home Health                    HH Arranged: RN, PT, OT [facility] Agency: [facility] Date [place]: 09/10/19   Representative spoke with at [place]: [place] ([place]) Interventions     Readmission Risk Interventions Readmission Risk Prevention Plan 01/06/2018  Transportation Screening Complete  PCP or Specialist Appt within 5-7 Days Complete  Home Care Screening Complete  Medication Review (RN CM) Complete  Some recent data might be hidden

## 2019-09-10 NOTE — Progress Notes (Signed)
Pulmonary Medicine          Date: 09/10/2019,   MRN# [id number] Ana Washington August 13, 1945     AdmissionWeight: 49.9 kg                 CurrentWeight: 49.9 kg   Referring physician: Dr. Lorin Mercy   CHIEF COMPLAINT:   Acute exacerbation of bronchiectasis   SUBJECTIVE   Patient feels significantly improved. She was unable to sleep well last night. She is very close to baseline. She is cleared for dc home today.  Please dc with zithromax 250mg  po daily x 5 days, please continue TOBI inhaled bid q28 days on and off cycled, please continue steroid with taper starting prednisone 50mg  po daily to decrease by 10 each day (50>40>30>20>10>dc),  Please continue one time hycodan cough syrup 359ml to be used PRN q8h 16ml for cough and chest discomfort.   Follow up with me in clinic within 2 wks please.   PAST MEDICAL HISTORY   Past Medical History:  Diagnosis Date   Arthritis    Atrial fibrillation ([place])    Bronchiectasis ([facility])    CAD (coronary artery disease) 07/30/2017   Dumping syndrome    Essential hypertension, malignant 10/03/2013   Family history of adverse reaction to anesthesia    sister [name]   GERD (gastroesophageal reflux disease)    Headache    MIGRAINES   Myocardial infarction ([place]) 2007   Non-STEMI   [name] (postoperative nausea and vomiting)    Psoriasis    PUD (peptic ulcer disease)      SURGICAL HISTORY   Past Surgical History:  Procedure Laterality Date   BACK SURGERY     CHOLECYSTECTOMY     ESOPHAGOGASTRODUODENOSCOPY (EGD) WITH PROPOFOL N/A 03/19/2019   Procedure: ESOPHAGOGASTRODUODENOSCOPY (EGD) WITH PROPOFOL;  Surgeon: [name], MD;  Location: [facility] [place] ENDOSCOPY;  Service: Gastroenterology;  Laterality: N/A;  *Note to anesthesia: Per pt's pulmonologist, if intubating, please extubate to BIPAP.   EYE SURGERY     FOOT SURGERY     INTRAMEDULLARY (IM) NAIL INTERTROCHANTERIC Right 09/30/2018   Procedure: INTRAMEDULLARY (IM) NAIL  INTERTROCHANTRIC;  Surgeon: Dereck Leep, MD;  Location: [facility] ORS;  Service: Orthopedics;  Laterality: Right;   KYPHOPLASTY N/A 07/05/2016   Procedure: KYPHOPLASTY T - 9;  Surgeon: Hessie Knows, MD;  Location: [facility] ORS;  Service: Orthopedics;  Laterality: N/A;   KYPHOPLASTY N/A 11/29/2017   Procedure: Iona Hansen;  Surgeon: Hessie Knows, MD;  Location: [facility] ORS;  Service: Orthopedics;  Laterality: N/A;  L2 and L3   KYPHOPLASTY N/A 12/18/2017   Procedure: KYPHOPLASTY L1;  Surgeon: Hessie Knows, MD;  Location: [facility] ORS;  Service: Orthopedics;  Laterality: N/A;   KYPHOPLASTY N/A 01/05/2018   Procedure: KYPHOPLASTY-T11,T12;  Surgeon: Hessie Knows, MD;  Location: [facility] ORS;  Service: Orthopedics;  Laterality: N/A;   KYPHOPLASTY N/A 04/05/2018   Procedure: T10 KYPHOPLASTY;  Surgeon: Hessie Knows, MD;  Location: [facility] ORS;  Service: Orthopedics;  Laterality: N/A;   KYPHOPLASTY N/A 04/12/2018   Procedure: KYPHOPLASTY T7,8;  Surgeon: Hessie Knows, MD;  Location: [facility] ORS;  Service: Orthopedics;  Laterality: N/A;   KYPHOPLASTY N/A 04/19/2018   Procedure: KYPHOPLASTY T5, T6;  Surgeon: Hessie Knows, MD;  Location: [facility] ORS;  Service: Orthopedics;  Laterality: N/A;   LUNG SURGERY  1990 and 1996   THOROCOTOMY WITH LOBECTOMY     LEFT LOWER THORACOTOMY / RIGHT MIDDLE LOBECTOMY     FAMILY HISTORY   Family History  Problem Relation Age  of Onset   Hypertension Mother    Hypertension Father      SOCIAL HISTORY   Social History   Tobacco Use   Smoking status: Never Smoker   Smokeless tobacco: Never Used  Vaping Use   Vaping Use: Never used  Substance Use Topics   Alcohol use: No   Drug use: No     MEDICATIONS    Home Medication:    Current Medication:  Current Facility-Administered Medications:    acetylcysteine (MUCOMYST) 20 % nebulizer / oral solution 600 mg, 600 mg, Oral, QPC breakfast, Karmen Bongo, MD, 600 mg at 09/10/19 0835   acidophilus (RISAQUAD)  capsule 1 capsule, 1 capsule, Oral, Daily, Karmen Bongo, MD, 1 capsule at 09/09/19 1050   albuterol (PROVENTIL) (2.5 MG/3ML) 0.083% nebulizer solution 2.5 mg, 2.5 mg, Nebulization, Q2H PRN, Karmen Bongo, MD, 2.5 mg at 09/09/19 1559   amLODipine (NORVASC) tablet 5 mg, 5 mg, Oral, Daily, Karmen Bongo, MD, 5 mg at 09/09/19 1049   apixaban (ELIQUIS) tablet 5 mg, 5 mg, Oral, BID, Karmen Bongo, MD, 5 mg at 09/09/19 2126   cefTAZidime (FORTAZ) 2 g in sodium chloride 0.9 % 100 mL IVPB, 2 g, Intravenous, Q12H, Ottie Glazier, MD, Stopped at 09/09/19 2212   chlorpheniramine-HYDROcodone (TUSSIONEX) 10-8 MG/5ML suspension 5 mL, 5 mL, Oral, QHS, Karmen Bongo, MD, 5 mL at 09/09/19 2133   diltiazem (CARDIZEM CD) 24 hr capsule 120 mg, 120 mg, Oral, Daily, Karmen Bongo, MD, 120 mg at 09/09/19 1050   feeding supplement (BOOST / RESOURCE BREEZE) liquid 1 Container, 1 Container, Oral, TID BM, Enzo Bi, MD, 1 Container at 09/09/19 2029   gabapentin (NEURONTIN) capsule 300 mg, 300 mg, Oral, QID, Karmen Bongo, MD, 300 mg at 09/09/19 2126   lactated ringers infusion, , Intravenous, Continuous, Ottie Glazier, MD, Last Rate: 30 mL/hr at 09/10/19 0300, Rate Verify at 09/10/19 0300   LORazepam (ATIVAN) tablet 0.5 mg, 0.5 mg, Oral, QHS, [place], Bing Neighbors, NP, 0.5 mg at 09/09/19 2254   losartan (COZAAR) tablet 100 mg, 100 mg, Oral, Daily, Karmen Bongo, MD, 100 mg at 09/09/19 1049   methylPREDNISolone sodium succinate (SOLU-MEDROL) 125 mg/2 mL injection 60 mg, 60 mg, Intravenous, Q12H, Karmen Bongo, MD, 60 mg at 09/09/19 2134   metoprolol succinate (TOPROL-XL) 24 hr tablet 100 mg, 100 mg, Oral, Daily, Karmen Bongo, MD, 100 mg at 09/09/19 1050   pantoprazole (PROTONIX) injection 40 mg, 40 mg, Intravenous, Q12H, Ottie Glazier, MD, 40 mg at 09/09/19 2138   senna-docusate (Senokot-S) tablet 1 tablet, 1 tablet, Oral, QHS, Enzo Bi, MD, 1 tablet at 09/09/19 2126   sodium  chloride flush (NS) 0.9 % injection 3 mL, 3 mL, Intravenous, Q12H, Karmen Bongo, MD, 3 mL at 09/09/19 1051   sodium chloride HYPERTONIC 3 % nebulizer solution 4 mL, 4 mL, Inhalation, BID, Enzo Bi, MD, 4 mL at 09/10/19 0747   tobramycin (PF) (TOBI) nebulizer solution 300 mg, 300 mg, Nebulization, BID, Ottie Glazier, MD, 300 mg at 09/10/19 0747   traMADol (ULTRAM) tablet 50-100 mg, 50-100 mg, Oral, Q4H PRN, Karmen Bongo, MD    ALLERGIES   Codeine, Sulfa antibiotics, and Penicillins     REVIEW OF SYSTEMS    Review of Systems:  Gen:  Denies  fever, sweats, chills weigh loss  HEENT: Denies blurred vision, double vision, ear pain, eye pain, hearing loss, nose bleeds, sore throat Cardiac:  No dizziness, chest pain or heaviness, chest tightness,edema Resp:   Denies cough or sputum porduction, shortness of breath,wheezing,  hemoptysis,  Gi: Denies swallowing difficulty, stomach pain, nausea or vomiting, diarrhea, constipation, bowel incontinence Gu:  Denies bladder incontinence, burning urine Ext:   Denies Joint pain, stiffness or swelling Skin: Denies  skin rash, easy bruising or bleeding or hives Endoc:  Denies polyuria, polydipsia , polyphagia or weight change Psych:   Denies depression, insomnia or hallucinations   Other:  All other systems negative   VS: BP 134/84 (BP Location: Left Arm)    Pulse 73    Temp (!) 97.4 F (36.3 C) (Oral)    Resp 16    Ht 5\' 5"  (1.651 m)    Wt 49.9 kg    SpO2 100%    BMI 18.30 kg/m      PHYSICAL EXAM    GENERAL:NAD, no fevers, chills, no weakness no fatigue HEAD: Normocephalic, atraumatic.  EYES: Pupils equal, round, reactive to light. Extraocular muscles intact. No scleral icterus.  MOUTH: Moist mucosal membrane. Dentition intact. No abscess noted.  EAR, NOSE, THROAT: Clear without exudates. No external lesions.  NECK: Supple. No thyromegaly. No nodules. No JVD.  PULMONARY: Bilateral rhonchorous breath sounds with  wheezing CARDIOVASCULAR: S1 and S2. Regular rate and rhythm. No murmurs, rubs, or gallops. No edema. Pedal pulses 2+ bilaterally.  GASTROINTESTINAL: Soft, nontender, nondistended. No masses. Positive bowel sounds. No hepatosplenomegaly.  MUSCULOSKELETAL: No swelling, clubbing, or edema. Range of motion full in all extremities.  NEUROLOGIC: Cranial nerves II through XII are intact. No gross focal neurological deficits. Sensation intact. Reflexes intact.  SKIN: No ulceration, lesions, rashes, or cyanosis. Skin warm and dry. Turgor intact.  PSYCHIATRIC: Mood, affect within normal limits. The patient is awake, alert and oriented x 3. Insight, judgment intact.       IMAGING    DG Chest 1 View  Result Date: 09/08/2019 CLINICAL DATA:  Shortness of breath, cough EXAM: CHEST  1 VIEW COMPARISON:  Chest radiograph dated 10/07/2018. CT chest dated 09/28/2018. FINDINGS: Postsurgical changes in the right upper lobe, right perihilar region, and left mid lung. Chronic interstitial markings in the lungs bilaterally, with suspected bronchiectasis, better evaluated on prior CT. Left lower lobe opacity, likely reflecting atelectasis with associated eventration of left hemidiaphragm when correlating with prior CT. The heart is normal in size. Multilevel thoracolumbar vertebral augmentation. Cholecystectomy clips. IMPRESSION: No evidence of acute cardiopulmonary disease. Chronic and postprocedural changes, as above. Electronically Signed   By: Julian Hy M.D.   On: 09/08/2019 07:52      ASSESSMENT/PLAN   Acute exacerbation of bronchiectasis - ok to dc home as per instructions below  Please dc with zithromax 250mg  po daily x 5 days, please continue TOBI inhaled bid q28 days on and off cycled, please continue steroid with taper starting prednisone 50mg  po daily to decrease by 10 each day (50>40>30>20>10>dc),  Please continue one time hycodan cough syrup 347ml to be used PRN q8h 50ml for cough and chest  discomfort.   Follow up with me in clinic within 2 wks please.   -Complicated by severe GERD due to large hiatal hernia with microaspiration  -Respiratory viral panel -Pro calcitonin trend -D-dimer -Codeine Hycodan 5 mL every 8 hours -PT OT when possible  -Blood cultures x2 -Chest physiotherapy with MetaNeb for twice daily -Mucomyst 4 mL twice daily -Protonix 40 twice daily IV -Continue TOBI, Continue Gentamycin, stopped Tressie Ellis    Thank you for allowing me to participate in the care of this patient.   Patient/Family are satisfied with care plan and all questions have been answered.  This document was prepared using Dragon voice recognition software and may include unintentional dictation errors.     Ottie Glazier, M.D.  Division of [place]

## 2019-09-10 NOTE — Discharge Summary (Addendum)
Physician Discharge Summary   Ana Washington  female DOB: [birth year]  UKG:[id number]  PCP: [name], MD  Admit date: 09/08/2019 Discharge date: 09/10/2019  Admitted From: home Disposition:  home CODE STATUS: DNR    Hospital Course:  For full details, please see H&P, progress notes, consult notes and ancillary notes.  Briefly,  Ana Washington a 74 y.o.femalewith medical history significant ofchronic respiratory failure, on home O2 (2L), due to bronchiectasis; HTN; and afib on Eliquis presenting with SOB.   Acute on chronic respiratory failure 2/2 Bronchiectasisexacerbation Sepsis ruled out Complicated by severe GERD due to large hiatal hernia with microaspiration.  afebrile but positive leukocytosis on presentation.  Chest x-ray was not consistent with pneumonia.  RVP neg, procal neg.  Pt follows with Dr. Keenan Bachelor outpatient, who saw pt as inpatient consult.  Pt prescribed Solu-Medrol 60 twice daily which was tapered to prednisone.  Chest physiotherapy with MetaNeb twice daily.  Mucomyst 4 mL twice daily.  Protonix 40 twice daily.  TOBI BID.  Pt received 3 days of Fortaz.  Pt's respiratory status much improved, and pulm cleared pt for discharged, to continue outpatient followup with Dr. Lanney Gins.  HTN Continued home Norvasc, Cozaar, Toprol XL and cardizem  Afib Rate controlled.  continued home Cardizem and Toprol, and Eliquis for [facility]  Chronic pain Continued Neurontin  Non-severe (moderate) malnutrition in context of social or environmental circumstances (chronic poor appetite, bronchiectasis) as evidenced by mild fat depletion, moderate muscle depletion, severe muscle depletion.  Anxiety continued home Ativan nightly.   Discharge Diagnoses:  Principal Problem:   Bronchiectasis ([place]) Active Problems:   Essential hypertension   Hyperlipidemia, mixed   Chronic pain syndrome   Chronic anticoagulation (ELIQUIS)   Atrial fibrillation, chronic  [facility])    Discharge Instructions:  Allergies as of 09/10/2019      Reactions   Codeine Nausea And Vomiting   Sulfa Antibiotics Diarrhea   Penicillins Rash   Has patient had a PCN reaction causing immediate rash, facial/tongue/throat swelling, SOB or lightheadedness with hypotension: Unknown Has patient had a PCN reaction causing severe rash involving mucus membranes or skin necrosis: Unknown Has patient had a PCN reaction that required hospitalization: Unknown Has patient had a PCN reaction occurring within the last 10 years: No If all of the above answers are "NO", then may proceed with Cephalosporin use.      Medication List    STOP taking these medications   chlorpheniramine-HYDROcodone 10-8 MG/5ML Suer Commonly known as: TUSSIONEX   ciprofloxacin 500 MG tablet Commonly known as: CIPRO     TAKE these medications   acetaminophen 500 MG tablet Commonly known as: TYLENOL Take 1,000 mg by mouth every 6 (six) hours as needed for mild pain or headache.   albuterol (2.5 MG/3ML) 0.083% nebulizer solution Commonly known as: PROVENTIL Take 3 mLs (2.5 mg total) by nebulization every 6 (six) hours as needed for shortness of breath.   Align 4 MG Caps Take 4 mg by mouth daily.   amLODipine 5 MG tablet Commonly known as: NORVASC Take 5 mg by mouth daily.   apixaban 5 MG Tabs tablet Commonly known as: ELIQUIS Take 5 mg by mouth 2 (two) times daily.   azithromycin 250 MG tablet Commonly known as: ZITHROMAX Take 1 tablet (250 mg total) by mouth daily for 5 days.   diltiazem 120 MG 24 hr capsule Commonly known as: CARDIZEM CD Take 120 mg by mouth daily.   ferrous sulfate 325 (65 FE) MG EC tablet  Take 325 mg by mouth daily.   furosemide 20 MG tablet Commonly known as: LASIX Take 20 mg by mouth daily as needed for edema.   gabapentin 300 MG capsule Commonly known as: NEURONTIN Take 300 mg by mouth 4 (four) times daily.   HYDROcodone-homatropine 5-1.5 MG/5ML  syrup Commonly known as: Hycodan Take 5 mLs by mouth every 8 (eight) hours as needed for cough.   LORazepam 0.5 MG tablet Commonly known as: ATIVAN Take 1 tablet (0.5 mg total) by mouth at bedtime.   losartan 100 MG tablet Commonly known as: COZAAR Take 100 mg by mouth daily.   metoprolol succinate 100 MG 24 hr tablet Commonly known as: TOPROL-XL Take 1 tablet (100 mg total) by mouth daily.   NAC 600 MG Caps Generic drug: Acetylcysteine Take 600 mg by mouth daily after breakfast.   omeprazole 20 MG capsule Commonly known as: PRILOSEC Take 20 mg by mouth daily.   potassium chloride 10 MEQ tablet Commonly known as: KLOR-CON Take 10 mEq by mouth daily.   predniSONE 10 MG tablet Commonly known as: DELTASONE Take 40 mg (4 tablets) on 8/11, then 30 mg (3 tablets) on 8/12, then 20 mg (2 tablets) on 8/13, then 10 mg (1 tablet) on 8/14, and continue to take your maintenance dose of 10 mg daily until you follow up with Dr. Lanney Gins. Start taking on: September 11, 2019 What changed:   how much to take  how to take this  when to take this  additional instructions   Sodium Chloride (Inhalant) 7 % Nebu Inhale 4 mLs into the lungs 3 (three) times daily.   tobramycin (PF) 300 MG/5ML nebulizer solution Commonly known as: TOBI Take 5 mLs (300 mg total) by nebulization 2 (two) times daily for 28 days. From 8/10 to 9/6.  Then off for 28 days.  Follow up with Dr. Lanney Gins for further instruction and refills.   vitamin C 1000 MG tablet Take 1,000 mg by mouth daily.        Follow-up Information    [facility] Follow up.   Specialty: Home Health Services Why: They will follow up with you for your home health needs. Contact information: [address] [place] [address] [phone]        [name], MD. Go on 09/25/2019.   Specialty: Family Medicine Why: @2 :30pm Contact information: [place] [address] [phone]         Ottie Glazier, MD. Go on 09/24/2019.   Specialty: Pulmonary Disease Why: @11am  Contact information: [place] [address] [phone]               Allergies  Allergen Reactions  . Codeine Nausea And Vomiting  . Sulfa Antibiotics Diarrhea  . Penicillins Rash    Has patient had a PCN reaction causing immediate rash, facial/tongue/throat swelling, SOB or lightheadedness with hypotension: Unknown Has patient had a PCN reaction causing severe rash involving mucus membranes or skin necrosis: Unknown Has patient had a PCN reaction that required hospitalization: Unknown Has patient had a PCN reaction occurring within the last 10 years: No If all of the above answers are "NO", then may proceed with Cephalosporin use.      The results of significant diagnostics from this hospitalization (including imaging, microbiology, ancillary and laboratory) are listed below for reference.   Consultations:   Procedures/Studies: DG Chest 1 View  Result Date: 09/08/2019 CLINICAL DATA:  Shortness of breath, cough EXAM: CHEST  1 VIEW COMPARISON:  Chest radiograph dated 10/07/2018. CT chest dated 09/28/2018. FINDINGS: Postsurgical changes in the right upper lobe, right perihilar region, and left mid lung. Chronic interstitial markings in the lungs bilaterally, with suspected bronchiectasis, better evaluated on prior CT. Left lower lobe opacity, likely reflecting atelectasis with associated eventration of left hemidiaphragm when correlating with prior CT. The heart is normal in size. Multilevel thoracolumbar vertebral augmentation. Cholecystectomy clips. IMPRESSION: No evidence of acute cardiopulmonary disease. Chronic and postprocedural changes, as above. Electronically Signed   By: Julian Hy M.D.   On: 09/08/2019 07:52      Labs: BNP (last 3 results) Recent Labs    09/27/18 1309 09/08/19 0650  BNP 108.0* 676.7*   Basic Metabolic Panel: Recent Labs  Lab  09/08/19 0650 09/09/19 0417 09/10/19 0453  NA 138 134* 136  K 2.9* 4.6 4.1  CL 92* 93* 92*  CO2 37* 35* 35*  GLUCOSE 95 157* 134*  BUN 11 16 17   CREATININE 0.65 0.65 0.52  CALCIUM 8.8* 8.8* 8.9  MG 1.5*  --  2.2   Liver Function Tests: Recent Labs  Lab 09/08/19 0650  AST 18  ALT 9  ALKPHOS 38  BILITOT 0.8  PROT 6.2*  ALBUMIN 3.6   No results for input(s): LIPASE, AMYLASE in the last 168 hours. No results for input(s): AMMONIA in the last 168 hours. CBC: Recent Labs  Lab 09/08/19 0650 09/09/19 0417 09/10/19 0453  WBC 17.5* 9.4 17.8*  NEUTROABS 12.4*  --   --   HGB 12.8 12.8 11.6*  HCT 38.3 36.6 34.7*  MCV 96.7 96.3 100.0  PLT 306 245 264   Cardiac Enzymes: No results for input(s): CKTOTAL, CKMB, CKMBINDEX, TROPONINI in the last 168 hours. BNP: Invalid input(s): POCBNP CBG: No results for input(s): GLUCAP in the last 168 hours. D-Dimer No results for input(s): DDIMER in the last 72 hours. Hgb A1c No results for input(s): HGBA1C in the last 72 hours. Lipid Profile No results for input(s): CHOL, HDL, LDLCALC, TRIG, CHOLHDL, LDLDIRECT in the last 72 hours. Thyroid function studies No results for input(s): TSH, T4TOTAL, T3FREE, THYROIDAB in the last 72 hours.  Invalid input(s): FREET3 Anemia work up No results for input(s): VITAMINB12, FOLATE, FERRITIN, TIBC, IRON, RETICCTPCT in the last 72 hours. Urinalysis    Component Value Date/Time   COLORURINE YELLOW (A) 09/27/2018 2240   APPEARANCEUR CLEAR (A) 09/27/2018 2240   LABSPEC 1.013 09/27/2018 2240   PHURINE 7.0 09/27/2018 2240   GLUCOSEU NEGATIVE 09/27/2018 2240   HGBUR NEGATIVE 09/27/2018 2240   BILIRUBINUR NEGATIVE 09/27/2018 2240   KETONESUR NEGATIVE 09/27/2018 2240   PROTEINUR NEGATIVE 09/27/2018 2240   NITRITE NEGATIVE 09/27/2018 2240   LEUKOCYTESUR NEGATIVE 09/27/2018 2240   Sepsis Labs Invalid input(s): PROCALCITONIN,  WBC,  LACTICIDVEN Microbiology Recent Results (from the past 240  hour(s))  SARS Coronavirus 2 by RT PCR (hospital order, performed in [facility] hospital lab) Nasopharyngeal Nasopharyngeal Swab     Status: None   Collection Time: 09/08/19  7:50 AM   Specimen: Nasopharyngeal Swab  Result Value Ref Range Status   SARS Coronavirus 2 NEGATIVE NEGATIVE Final    Comment: (NOTE) SARS-CoV-2 target nucleic acids are NOT DETECTED.  The SARS-CoV-2 RNA is generally detectable in upper and lower respiratory specimens during the acute phase of infection. The lowest concentration of SARS-CoV-2 viral copies this assay can detect is 250 copies / mL. A negative result does not preclude SARS-CoV-2 infection and should not be used as the  sole basis for treatment or other patient management decisions.  A negative result may occur with improper specimen collection / handling, submission of specimen other than nasopharyngeal swab, presence of viral mutation(s) within the areas targeted by this assay, and inadequate number of viral copies (<250 copies / mL). A negative result must be combined with clinical observations, patient history, and epidemiological information.  Fact Sheet for Patients:   StrictlyIdeas.no  Fact Sheet for Healthcare Providers: BankingDealers.co.za  This test is not yet approved or  cleared by the [place] FDA and has been authorized for detection and/or diagnosis of SARS-CoV-2 by FDA under an Emergency Use Authorization (EUA).  This EUA will remain in effect (meaning this test can be used) for the duration of the COVID-19 declaration under Section 564(b)(1) of the Act, 21 U.S.C. section 360bbb-3(b)(1), unless the authorization is terminated or revoked sooner.  Performed at [facility], [address] [place], [place] [address]   Blood culture (routine x 2)     Status: None (Preliminary result)   Collection Time: 09/08/19  7:50 AM   Specimen: BLOOD  Result Value Ref Range  Status   Specimen Description BLOOD LEFT ANTECUBITAL  Final   Special Requests   Final    BOTTLES DRAWN AEROBIC AND ANAEROBIC Blood Culture adequate volume   Culture   Final    NO GROWTH 2 DAYS Performed at [facility], [address], [place], [place] [address]    Report Status PENDING  Incomplete  Blood culture (routine x 2)     Status: None (Preliminary result)   Collection Time: 09/08/19  7:50 AM   Specimen: BLOOD  Result Value Ref Range Status   Specimen Description BLOOD BLOOD LEFT FOREARM  Final   Special Requests   Final    BOTTLES DRAWN AEROBIC AND ANAEROBIC Blood Culture adequate volume   Culture   Final    NO GROWTH 2 DAYS Performed at [facility], [address] [place], [place] [address]    Report Status PENDING  Incomplete  Expectorated sputum assessment w rflx to resp cult     Status: None   Collection Time: 09/08/19  1:15 PM   Specimen: Sputum  Result Value Ref Range Status   Specimen Description SPUTUM  Final   Special Requests NONE  Final   Sputum evaluation   Final    Sputum specimen not acceptable for testing.  Please recollect.   CALLED TO ERICA JOHNSON AT 5498 ON 09/09/19 BY SS Performed at [facility], [address] [place], [place] [address]    Report Status 09/09/2019 FINAL  Final  CULTURE, BLOOD (ROUTINE X 2) w Reflex to ID Panel     Status: None (Preliminary result)   Collection Time: 09/08/19  6:29 PM   Specimen: BLOOD  Result Value Ref Range Status   Specimen Description BLOOD RIGHT [facility]  Final   Special Requests   Final    BOTTLES DRAWN AEROBIC AND ANAEROBIC Blood Culture results may not be optimal due to an excessive volume of blood received in culture bottles   Culture   Final    NO GROWTH 2 DAYS Performed at [facility], [address] [place],  [address]    Report Status PENDING  Incomplete  CULTURE, BLOOD (ROUTINE X 2) w Reflex to ID Panel     Status: None (Preliminary result)    Collection Time: 09/08/19  6:37 PM   Specimen: BLOOD  Result Value Ref Range Status  Specimen Description BLOOD LEFT HAND  Final   Special Requests   Final    BOTTLES DRAWN AEROBIC AND ANAEROBIC Blood Culture adequate volume   Culture   Final    NO GROWTH 2 DAYS Performed at [facility], [address] [place], [place] [address]    Report Status PENDING  Incomplete  Respiratory Panel by PCR     Status: None   Collection Time: 09/08/19  7:46 PM   Specimen: Nasopharyngeal Swab; Respiratory  Result Value Ref Range Status   Adenovirus NOT DETECTED NOT DETECTED Final   Coronavirus 229E NOT DETECTED NOT DETECTED Final    Comment: (NOTE) The Coronavirus on the Respiratory Panel, DOES NOT test for the novel  Coronavirus (2019 nCoV)    Coronavirus HKU1 NOT DETECTED NOT DETECTED Final   Coronavirus NL63 NOT DETECTED NOT DETECTED Final   Coronavirus OC43 NOT DETECTED NOT DETECTED Final   Metapneumovirus NOT DETECTED NOT DETECTED Final   Rhinovirus / Enterovirus NOT DETECTED NOT DETECTED Final   Influenza A NOT DETECTED NOT DETECTED Final   Influenza B NOT DETECTED NOT DETECTED Final   Parainfluenza Virus 1 NOT DETECTED NOT DETECTED Final   Parainfluenza Virus 2 NOT DETECTED NOT DETECTED Final   Parainfluenza Virus 3 NOT DETECTED NOT DETECTED Final   Parainfluenza Virus 4 NOT DETECTED NOT DETECTED Final   Respiratory Syncytial Virus NOT DETECTED NOT DETECTED Final   Bordetella pertussis NOT DETECTED NOT DETECTED Final   Chlamydophila pneumoniae NOT DETECTED NOT DETECTED Final   Mycoplasma pneumoniae NOT DETECTED NOT DETECTED Final    Comment: Performed at [facility], [place]. [address] [place], [place] [address]  Urine culture     Status: None   Collection Time: 09/09/19  8:45 AM   Specimen: Urine, Random  Result Value Ref Range Status   Specimen Description   Final    URINE, RANDOM Performed at [facility], [address] [place], [place] [address]     Special Requests   Final    NONE Performed at [facility], [address], [place], [place] [address]    Culture   Final    NO GROWTH Performed at [facility], [place] [address], [place], [place] [address]    Report Status 09/10/2019 FINAL  Final     Total time spend on discharging this patient, including the last patient exam, discussing the hospital stay, instructions for ongoing care as it relates to all pertinent caregivers, as well as preparing the medical discharge records, prescriptions, and/or referrals as applicable, is 45 minutes.    Enzo Bi, MD  [facility] 09/10/2019, 10:38 AM  If 7PM-7AM, please contact night-coverage

## 2019-09-10 NOTE — Progress Notes (Signed)
Ammie Ferrier to be D/C'd Home per MD order.  Discussed prescriptions and follow up appointments with the patient. Prescriptions given to patient, medication list explained in detail. Pt verbalized understanding.  Allergies as of 09/10/2019      Reactions   Codeine Nausea And Vomiting   Sulfa Antibiotics Diarrhea   Penicillins Rash   Has patient had a PCN reaction causing immediate rash, facial/tongue/throat swelling, SOB or lightheadedness with hypotension: Unknown Has patient had a PCN reaction causing severe rash involving mucus membranes or skin necrosis: Unknown Has patient had a PCN reaction that required hospitalization: Unknown Has patient had a PCN reaction occurring within the last 10 years: No If all of the above answers are "NO", then may proceed with Cephalosporin use.      Medication List    STOP taking these medications   chlorpheniramine-HYDROcodone 10-8 MG/5ML Suer Commonly known as: TUSSIONEX   ciprofloxacin 500 MG tablet Commonly known as: CIPRO     TAKE these medications   acetaminophen 500 MG tablet Commonly known as: TYLENOL Take 1,000 mg by mouth every 6 (six) hours as needed for mild pain or headache.   albuterol (2.5 MG/3ML) 0.083% nebulizer solution Commonly known as: PROVENTIL Take 3 mLs (2.5 mg total) by nebulization every 6 (six) hours as needed for shortness of breath.   Align 4 MG Caps Take 4 mg by mouth daily.   amLODipine 5 MG tablet Commonly known as: NORVASC Take 5 mg by mouth daily.   apixaban 5 MG Tabs tablet Commonly known as: ELIQUIS Take 5 mg by mouth 2 (two) times daily.   azithromycin 250 MG tablet Commonly known as: ZITHROMAX Take 1 tablet (250 mg total) by mouth daily for 5 days.   diltiazem 120 MG 24 hr capsule Commonly known as: CARDIZEM CD Take 120 mg by mouth daily.   ferrous sulfate 325 (65 FE) MG EC tablet Take 325 mg by mouth daily.   furosemide 20 MG tablet Commonly known as: LASIX Take 20 mg by mouth daily  as needed for edema.   gabapentin 300 MG capsule Commonly known as: NEURONTIN Take 300 mg by mouth 4 (four) times daily.   HYDROcodone-homatropine 5-1.5 MG/5ML syrup Commonly known as: Hycodan Take 5 mLs by mouth every 8 (eight) hours as needed for cough.   LORazepam 0.5 MG tablet Commonly known as: ATIVAN Take 1 tablet (0.5 mg total) by mouth at bedtime.   losartan 100 MG tablet Commonly known as: COZAAR Take 100 mg by mouth daily.   metoprolol succinate 100 MG 24 hr tablet Commonly known as: TOPROL-XL Take 1 tablet (100 mg total) by mouth daily.   NAC 600 MG Caps Generic drug: Acetylcysteine Take 600 mg by mouth daily after breakfast.   omeprazole 20 MG capsule Commonly known as: PRILOSEC Take 20 mg by mouth daily.   potassium chloride 10 MEQ tablet Commonly known as: KLOR-CON Take 10 mEq by mouth daily.   predniSONE 10 MG tablet Commonly known as: DELTASONE Take 40 mg (4 tablets) on 8/11, then 30 mg (3 tablets) on 8/12, then 20 mg (2 tablets) on 8/13, then 10 mg (1 tablet) on 8/14, and continue to take your maintenance dose of 10 mg daily until you follow up with Dr. Lanney Gins. Start taking on: September 11, 2019 What changed:   how much to take  how to take this  when to take this  additional instructions   Sodium Chloride (Inhalant) 7 % Nebu Inhale 4 mLs into the lungs  3 (three) times daily.   tobramycin (PF) 300 MG/5ML nebulizer solution Commonly known as: TOBI Take 5 mLs (300 mg total) by nebulization 2 (two) times daily for 28 days. From 8/10 to 9/6.  Then off for 28 days.  Follow up with Dr. Lanney Gins for further instruction and refills.   vitamin C 1000 MG tablet Take 1,000 mg by mouth daily.       Vitals:   09/10/19 0849 09/10/19 1142  BP: 130/87 (!) 126/91  Pulse: 68 78  Resp:  20  Temp:  98.2 F (36.8 C)  SpO2: 100% 99%    Skin clean, dry and intact without evidence of skin break down, no evidence of skin tears noted. IV catheter  discontinued intact. Site without signs and symptoms of complications. Dressing and pressure applied. Pt denies pain at this time. No complaints noted.  An After Visit Summary was printed and given to the patient. Patient escorted via [place], and D/C home via private auto.  Fuller Mandril, RN

## 2019-09-13 LAB — CULTURE, BLOOD (ROUTINE X 2)
Culture: NO GROWTH
Culture: NO GROWTH
Culture: NO GROWTH
Culture: NO GROWTH
Special Requests: ADEQUATE
Special Requests: ADEQUATE
Special Requests: ADEQUATE

## 2020-02-08 IMAGING — CR THORACIC SPINE 2 VIEWS
1 series · 1 of 1 positions shown · non-contrast
Comparison: MRI thoracic spine 04/17/2018

CLINICAL DATA: 72-year-old with spinal compression fractures and
kyphoplasty.

EXAM:
DG C-ARM 61-120 MIN; THORACIC SPINE 2 VIEWS

[cont.]
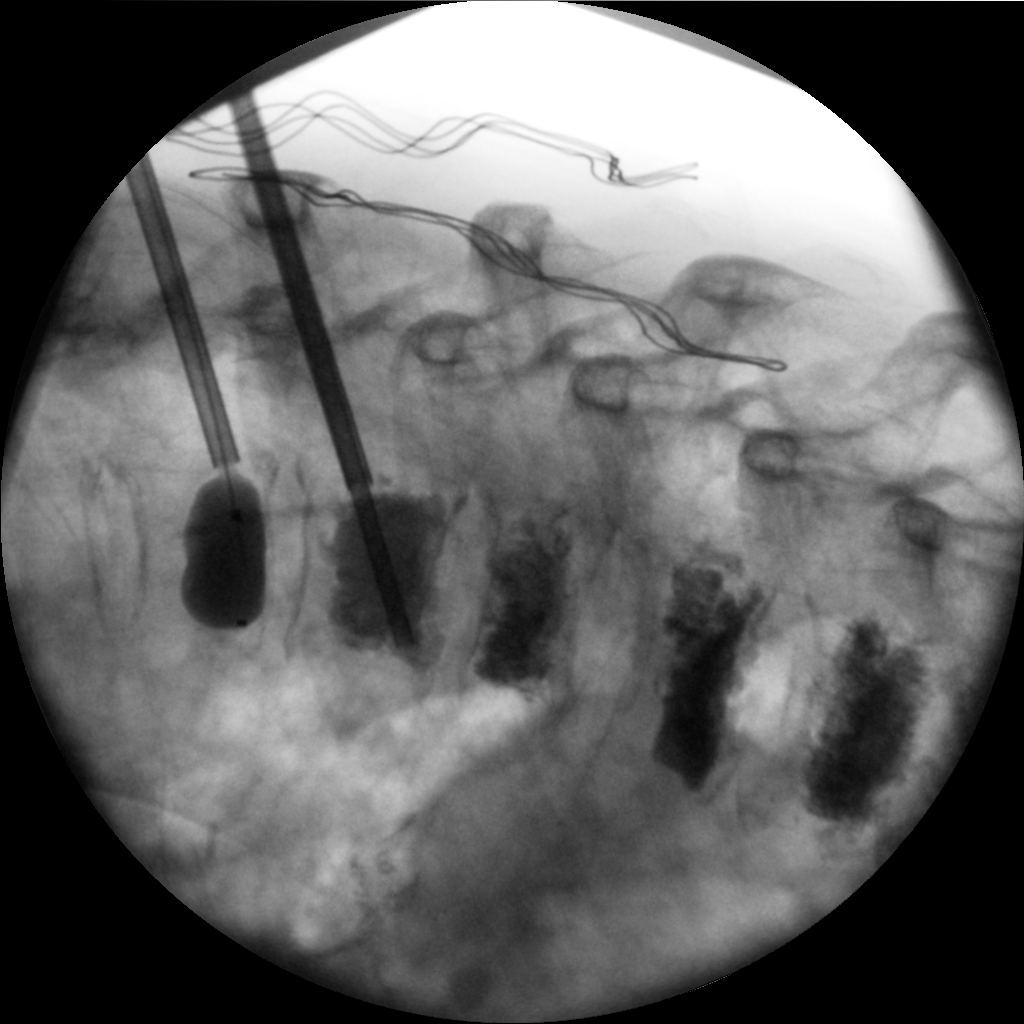

[1 of 1 positions shown; findings below may reference images not displayed]

FINDINGS: Intraoperative images demonstrate placement of bone cannulas at T5
and T6. Again noted is bone cement in T7, T8 and T9. Bone cement
placed at T6. There is a kyphoplasty balloon at T5.
IMPRESSION: Intraoperative images for T5 and T6 kyphoplasty.

## 2020-04-29 ENCOUNTER — Emergency Department: Payer: Medicare HMO

## 2020-04-29 ENCOUNTER — Other Ambulatory Visit: Payer: Self-pay

## 2020-04-29 ENCOUNTER — Inpatient Hospital Stay
Admission: EM | Admit: 2020-04-29 | Discharge: 2020-05-02 | DRG: 191 | Disposition: A | Discharge status: Home Health | Payer: Medicare HMO | Attending: Internal Medicine | Admitting: Internal Medicine

## 2020-04-29 DIAGNOSIS — Z8249 Family history of ischemic heart disease and other diseases of the circulatory system: Secondary | ICD-10-CM

## 2020-04-29 DIAGNOSIS — N189 Chronic kidney disease, unspecified: Secondary | ICD-10-CM

## 2020-04-29 DIAGNOSIS — I10 Essential (primary) hypertension: Secondary | ICD-10-CM | POA: Diagnosis present

## 2020-04-29 DIAGNOSIS — J471 Bronchiectasis with (acute) exacerbation: Principal | ICD-10-CM | POA: Diagnosis present

## 2020-04-29 DIAGNOSIS — Z7901 Long term (current) use of anticoagulants: Secondary | ICD-10-CM | POA: Diagnosis not present

## 2020-04-29 DIAGNOSIS — L89611 Pressure ulcer of right heel, stage 1: Secondary | ICD-10-CM

## 2020-04-29 DIAGNOSIS — Z66 Do not resuscitate: Secondary | ICD-10-CM | POA: Diagnosis present

## 2020-04-29 DIAGNOSIS — Z9981 Dependence on supplemental oxygen: Secondary | ICD-10-CM

## 2020-04-29 DIAGNOSIS — K219 Gastro-esophageal reflux disease without esophagitis: Secondary | ICD-10-CM | POA: Diagnosis present

## 2020-04-29 DIAGNOSIS — Z20822 Contact with and (suspected) exposure to covid-19: Secondary | ICD-10-CM | POA: Diagnosis present

## 2020-04-29 DIAGNOSIS — Z8711 Personal history of peptic ulcer disease: Secondary | ICD-10-CM | POA: Diagnosis not present

## 2020-04-29 DIAGNOSIS — K5900 Constipation, unspecified: Secondary | ICD-10-CM | POA: Diagnosis present

## 2020-04-29 DIAGNOSIS — I252 Old myocardial infarction: Secondary | ICD-10-CM | POA: Diagnosis not present

## 2020-04-29 DIAGNOSIS — N179 Acute kidney failure, unspecified: Secondary | ICD-10-CM | POA: Diagnosis present

## 2020-04-29 DIAGNOSIS — F419 Anxiety disorder, unspecified: Secondary | ICD-10-CM

## 2020-04-29 DIAGNOSIS — Z882 Allergy status to sulfonamides status: Secondary | ICD-10-CM

## 2020-04-29 DIAGNOSIS — Z885 Allergy status to narcotic agent status: Secondary | ICD-10-CM | POA: Diagnosis not present

## 2020-04-29 DIAGNOSIS — I480 Paroxysmal atrial fibrillation: Secondary | ICD-10-CM | POA: Diagnosis present

## 2020-04-29 DIAGNOSIS — J9611 Chronic respiratory failure with hypoxia: Secondary | ICD-10-CM | POA: Diagnosis present

## 2020-04-29 DIAGNOSIS — I2510 Atherosclerotic heart disease of native coronary artery without angina pectoris: Secondary | ICD-10-CM | POA: Diagnosis present

## 2020-04-29 DIAGNOSIS — R042 Hemoptysis: Secondary | ICD-10-CM | POA: Diagnosis present

## 2020-04-29 DIAGNOSIS — Z880 Allergy status to penicillin: Secondary | ICD-10-CM | POA: Diagnosis not present

## 2020-04-29 LAB — URINALYSIS, ROUTINE W REFLEX MICROSCOPIC
Bilirubin Urine: NEGATIVE
Glucose, UA: NEGATIVE mg/dL
Ketones, ur: NEGATIVE mg/dL
Leukocytes,Ua: NEGATIVE
Nitrite: NEGATIVE
Protein, ur: NEGATIVE mg/dL
Specific Gravity, Urine: 1.018 (ref 1.005–1.030)
pH: 5 (ref 5.0–8.0)

## 2020-04-29 LAB — BLOOD GAS, VENOUS
Acid-Base Excess: 13.2 mmol/L — ABNORMAL HIGH (ref 0.0–2.0)
Bicarbonate: 40.6 mmol/L — ABNORMAL HIGH (ref 20.0–28.0)
O2 Saturation: 46.1 %
Patient temperature: 37
pCO2, Ven: 64 mmHg — ABNORMAL HIGH (ref 44.0–60.0)
pH, Ven: 7.41 (ref 7.250–7.430)
pO2, Ven: <31 mmHg — CL (ref 32.0–45.0)

## 2020-04-29 LAB — COMPREHENSIVE METABOLIC PANEL
ALT: 13 U/L (ref 0–44)
AST: 26 U/L (ref 15–41)
Albumin: 3.8 g/dL (ref 3.5–5.0)
Alkaline Phosphatase: 41 U/L (ref 38–126)
Anion gap: 12 (ref 5–15)
BUN: 29 mg/dL — ABNORMAL HIGH (ref 8–23)
CO2: 33 mmol/L — ABNORMAL HIGH (ref 22–32)
Calcium: 8.9 mg/dL (ref 8.9–10.3)
Chloride: 93 mmol/L — ABNORMAL LOW (ref 98–111)
Creatinine, Ser: 1.15 mg/dL — ABNORMAL HIGH (ref 0.44–1.00)
GFR, Estimated: 50 mL/min — ABNORMAL LOW (ref >60)
Glucose, Bld: 115 mg/dL — ABNORMAL HIGH (ref 70–99)
Potassium: 3.7 mmol/L (ref 3.5–5.1)
Sodium: 138 mmol/L (ref 135–145)
Total Bilirubin: 0.7 mg/dL (ref 0.3–1.2)
Total Protein: 6.6 g/dL (ref 6.5–8.1)

## 2020-04-29 LAB — CBC WITH DIFFERENTIAL/PLATELET
Abs Immature Granulocytes: 0.2 10*3/uL — ABNORMAL HIGH (ref 0.00–0.07)
Basophils Absolute: 0 10*3/uL (ref 0.0–0.1)
Basophils Relative: 0 %
Eosinophils Absolute: 0 10*3/uL (ref 0.0–0.5)
Eosinophils Relative: 0 %
HCT: 38 % (ref 36.0–46.0)
Hemoglobin: 12.7 g/dL (ref 12.0–15.0)
Immature Granulocytes: 1 %
Lymphocytes Relative: 4 %
Lymphs Abs: 0.9 10*3/uL (ref 0.7–4.0)
MCH: 32.7 pg (ref 26.0–34.0)
MCHC: 33.4 g/dL (ref 30.0–36.0)
MCV: 97.9 fL (ref 80.0–100.0)
Monocytes Absolute: 1.4 10*3/uL — ABNORMAL HIGH (ref 0.1–1.0)
Monocytes Relative: 6 %
Neutro Abs: 19.1 10*3/uL — ABNORMAL HIGH (ref 1.7–7.7)
Neutrophils Relative %: 89 %
Platelets: 338 10*3/uL (ref 150–400)
RBC: 3.88 MIL/uL (ref 3.87–5.11)
RDW: 11.5 % (ref 11.5–15.5)
WBC: 21.6 10*3/uL — ABNORMAL HIGH (ref 4.0–10.5)
nRBC: 0 % (ref 0.0–0.2)

## 2020-04-29 LAB — TROPONIN I (HIGH SENSITIVITY)
Troponin I (High Sensitivity): 14 ng/L (ref <18)
Troponin I (High Sensitivity): 18 ng/L — ABNORMAL HIGH (ref <18)

## 2020-04-29 LAB — EXPECTORATED SPUTUM ASSESSMENT W GRAM STAIN, RFLX TO RESP C

## 2020-04-29 LAB — APTT: aPTT: 33 seconds (ref 24–36)

## 2020-04-29 LAB — RESP PANEL BY RT-PCR (FLU A&B, COVID) ARPGX2
Influenza A by PCR: NEGATIVE
Influenza B by PCR: NEGATIVE
SARS Coronavirus 2 by RT PCR: NEGATIVE

## 2020-04-29 LAB — HEMOGLOBIN AND HEMATOCRIT, BLOOD
HCT: 35.5 % — ABNORMAL LOW (ref 36.0–46.0)
HCT: 36.1 % (ref 36.0–46.0)
HCT: 37 % (ref 36.0–46.0)
Hemoglobin: 12.1 g/dL (ref 12.0–15.0)
Hemoglobin: 12 g/dL (ref 12.0–15.0)
Hemoglobin: 12.1 g/dL (ref 12.0–15.0)

## 2020-04-29 LAB — LACTIC ACID, PLASMA: Lactic Acid, Venous: 1.1 mmol/L (ref 0.5–1.9)

## 2020-04-29 LAB — TYPE AND SCREEN
ABO/RH(D): A POS
Antibody Screen: NEGATIVE

## 2020-04-29 LAB — LIPASE, BLOOD: Lipase: 42 U/L (ref 11–51)

## 2020-04-29 LAB — PROTIME-INR
INR: 1.4 — ABNORMAL HIGH (ref 0.8–1.2)
Prothrombin Time: 16.9 seconds — ABNORMAL HIGH (ref 11.4–15.2)

## 2020-04-29 LAB — D-DIMER, QUANTITATIVE: D-Dimer, Quant: 0.32 ug/mL-FEU (ref 0.00–0.50)

## 2020-04-29 LAB — BRAIN NATRIURETIC PEPTIDE: B Natriuretic Peptide: 71 pg/mL (ref 0.0–100.0)

## 2020-04-29 LAB — MAGNESIUM: Magnesium: 1.6 mg/dL — ABNORMAL LOW (ref 1.7–2.4)

## 2020-04-29 MED ORDER — DILTIAZEM HCL ER COATED BEADS 120 MG PO CP24
120.0000 mg | ORAL_CAPSULE | Freq: Every day | ORAL | Status: DC
  Administered 2020-04-29 – 2020-04-30 (×2): 120 mg via ORAL
  Filled 2020-04-29 (×2): qty 1

## 2020-04-29 MED ORDER — IPRATROPIUM-ALBUTEROL 0.5-2.5 (3) MG/3ML IN SOLN
3.0000 mL | Freq: Four times a day (QID) | RESPIRATORY_TRACT | Status: DC
  Administered 2020-04-29 – 2020-04-30 (×5): 3 mL via RESPIRATORY_TRACT
  Filled 2020-04-29 (×5): qty 3

## 2020-04-29 MED ORDER — ACETAMINOPHEN 325 MG PO TABS
650.0000 mg | ORAL_TABLET | Freq: Four times a day (QID) | ORAL | Status: DC | PRN
  Administered 2020-04-29 – 2020-05-01 (×5): 650 mg via ORAL
  Filled 2020-04-29 (×5): qty 2

## 2020-04-29 MED ORDER — SODIUM CHLORIDE 0.9 % IV SOLN: INTRAVENOUS | Status: DC

## 2020-04-29 MED ORDER — PANTOPRAZOLE SODIUM 40 MG PO TBEC
40.0000 mg | DELAYED_RELEASE_TABLET | Freq: Every day | ORAL | Status: DC
  Administered 2020-04-29 – 2020-05-02 (×4): 40 mg via ORAL
  Filled 2020-04-29 (×4): qty 1

## 2020-04-29 MED ORDER — MAGNESIUM SULFATE IN D5W 1-5 GM/100ML-% IV SOLN
1.0000 g | Freq: Once | INTRAVENOUS | Status: AC
  Administered 2020-04-29: 1 g via INTRAVENOUS
  Filled 2020-04-29: qty 100

## 2020-04-29 MED ORDER — IPRATROPIUM-ALBUTEROL 0.5-2.5 (3) MG/3ML IN SOLN
3.0000 mL | RESPIRATORY_TRACT | Status: AC
  Administered 2020-04-29: 3 mL via RESPIRATORY_TRACT
  Filled 2020-04-29: qty 3

## 2020-04-29 MED ORDER — ONDANSETRON HCL 4 MG/2ML IJ SOLN: 4.0000 mg | Freq: Four times a day (QID) | INTRAMUSCULAR | Status: DC | PRN

## 2020-04-29 MED ORDER — SODIUM CHLORIDE 0.9 % IV SOLN
2.0000 g | Freq: Two times a day (BID) | INTRAVENOUS | Status: DC
  Filled 2020-04-29 (×2): qty 2

## 2020-04-29 MED ORDER — FUROSEMIDE 20 MG PO TABS: 20.0000 mg | ORAL_TABLET | Freq: Every day | ORAL | Status: DC | PRN

## 2020-04-29 MED ORDER — BENZONATATE 100 MG PO CAPS: 200.0000 mg | ORAL_CAPSULE | Freq: Three times a day (TID) | ORAL | Status: DC | PRN

## 2020-04-29 MED ORDER — DM-GUAIFENESIN ER 30-600 MG PO TB12: 1.0000 | ORAL_TABLET | Freq: Two times a day (BID) | ORAL | Status: DC | PRN

## 2020-04-29 MED ORDER — METOPROLOL SUCCINATE ER 100 MG PO TB24
100.0000 mg | ORAL_TABLET | Freq: Every day | ORAL | Status: DC
  Administered 2020-04-30 – 2020-05-02 (×3): 100 mg via ORAL
  Filled 2020-04-29 (×2): qty 1
  Filled 2020-04-29: qty 2
  Filled 2020-04-29: qty 1

## 2020-04-29 MED ORDER — MAGNESIUM HYDROXIDE 400 MG/5ML PO SUSP: 30.0000 mL | Freq: Every day | ORAL | Status: DC | PRN

## 2020-04-29 MED ORDER — ONDANSETRON HCL 4 MG PO TABS: 4.0000 mg | ORAL_TABLET | Freq: Four times a day (QID) | ORAL | Status: DC | PRN

## 2020-04-29 MED ORDER — TOBRAMYCIN 300 MG/5ML IN NEBU
300.0000 mg | INHALATION_SOLUTION | Freq: Two times a day (BID) | RESPIRATORY_TRACT | Status: DC
  Administered 2020-04-29: 300 mg via RESPIRATORY_TRACT
  Filled 2020-04-29 (×2): qty 5

## 2020-04-29 MED ORDER — ACETYLCYSTEINE 20 % IN SOLN
4.0000 mL | Freq: Every day | RESPIRATORY_TRACT | Status: DC
  Administered 2020-04-29 – 2020-05-02 (×4): 4 mL via RESPIRATORY_TRACT
  Filled 2020-04-29 (×4): qty 4

## 2020-04-29 MED ORDER — GABAPENTIN 300 MG PO CAPS
300.0000 mg | ORAL_CAPSULE | Freq: Two times a day (BID) | ORAL | Status: DC | PRN
Start: 2020-04-29 — End: 2020-05-02
  Administered 2020-04-29 – 2020-05-01 (×3): 300 mg via ORAL
  Filled 2020-04-29 (×3): qty 1

## 2020-04-29 MED ORDER — GUAIFENESIN ER 600 MG PO TB12
600.0000 mg | ORAL_TABLET | Freq: Two times a day (BID) | ORAL | Status: DC
  Administered 2020-04-29 – 2020-05-02 (×7): 600 mg via ORAL
  Filled 2020-04-29 (×7): qty 1

## 2020-04-29 MED ORDER — TRAZODONE HCL 50 MG PO TABS: 25.0000 mg | ORAL_TABLET | Freq: Every evening | ORAL | Status: DC | PRN

## 2020-04-29 MED ORDER — DEXTROSE 5 % IV SOLN
800.0000 mg | Freq: Once | INTRAVENOUS | Status: AC
  Administered 2020-04-29: 800 mg via INTRAVENOUS
  Filled 2020-04-29: qty 3.2

## 2020-04-29 MED ORDER — POTASSIUM CHLORIDE 20 MEQ PO PACK
40.0000 meq | PACK | Freq: Once | ORAL | Status: AC
  Administered 2020-04-29: 40 meq via ORAL
  Filled 2020-04-29: qty 2

## 2020-04-29 MED ORDER — GLYCOPYRROLATE 1 MG PO TABS
1.0000 mg | ORAL_TABLET | Freq: Three times a day (TID) | ORAL | Status: DC | PRN
  Filled 2020-04-29: qty 1

## 2020-04-29 MED ORDER — DEXTROSE 5 % IV SOLN: 5.0000 mg/kg | Freq: Three times a day (TID) | INTRAVENOUS | Status: DC

## 2020-04-29 MED ORDER — ENOXAPARIN SODIUM 40 MG/0.4ML ~~LOC~~ SOLN: 40.0000 mg | SUBCUTANEOUS | Status: DC

## 2020-04-29 MED ORDER — ACETAMINOPHEN 650 MG RE SUPP: 650.0000 mg | Freq: Four times a day (QID) | RECTAL | Status: DC | PRN

## 2020-04-29 MED ORDER — SODIUM CHLORIDE 0.9 % IV SOLN
2.0000 g | INTRAVENOUS | Status: DC
  Administered 2020-04-29: 2 g via INTRAVENOUS
  Filled 2020-04-29: qty 20

## 2020-04-29 MED ORDER — RISAQUAD PO CAPS
1.0000 | ORAL_CAPSULE | Freq: Every day | ORAL | Status: DC
  Administered 2020-04-29 – 2020-05-02 (×4): 1 via ORAL
  Filled 2020-04-29 (×3): qty 1

## 2020-04-29 MED ORDER — IOHEXOL 300 MG/ML  SOLN
75.0000 mL | Freq: Once | INTRAMUSCULAR | Status: AC | PRN
  Administered 2020-04-29: 75 mL via INTRAVENOUS

## 2020-04-29 MED ORDER — METHYLPREDNISOLONE SODIUM SUCC 125 MG IJ SOLR
125.0000 mg | Freq: Once | INTRAMUSCULAR | Status: AC
  Administered 2020-04-29: 125 mg via INTRAVENOUS
  Filled 2020-04-29: qty 2

## 2020-04-29 MED ORDER — LORAZEPAM 0.5 MG PO TABS
0.5000 mg | ORAL_TABLET | Freq: Every day | ORAL | Status: DC
  Administered 2020-04-29 – 2020-05-01 (×3): 0.5 mg via ORAL
  Filled 2020-04-29 (×3): qty 1

## 2020-04-29 MED ORDER — SODIUM CHLORIDE 0.9 % IV SOLN
500.0000 mg | INTRAVENOUS | Status: DC
  Administered 2020-04-29: 500 mg via INTRAVENOUS
  Filled 2020-04-29: qty 500

## 2020-04-29 NOTE — H&P (Signed)
[place]   PATIENT NAME: Ana Washington    MR#:  [id number]  DATE OF BIRTH:  1945-03-11  DATE OF ADMISSION:  04/29/2020  PRIMARY CARE PHYSICIAN: [name], MD   Patient is coming from: Home  REQUESTING/REFERRING PHYSICIAN: Ward, Delice Bison, DO  CHIEF COMPLAINT:   Chief Complaint  Patient presents with  . Hemoptysis    HISTORY OF PRESENT ILLNESS:  Ana Washington is a 75 y.o. Caucasian female with medical history significant for atrial fibrillation on Eliquis, bronchiectasis, MAC, coronary artery disease, essential hypertension, GERD, migraines and peptic ulcer disease who presented to the emergency room with acute onset of worsening dyspnea with associated hemoptysis tonight.  The patient coughed up 4 handfuls of bright red blood with clots.  She was seen yesterday by Dr. Laqueta Jean and was noted to be hypotensive with a blood pressure of 87/63.  She was given 500 mill IV normal saline and 20 mg of IV Solu-Medrol.  She was discharged on 20 mg of p.o. prednisone.  She was also prescribed p.o. Levaquin.  She is chronically on 10 mg of p.o. prednisone.  She has been using her nebulizer at home without much relief.  No chest pain or palpitations.  No fever or chills.  No nausea or vomiting or abdominal pain.  No melena or bright red bleeding per rectum.  No other bleeding diathesis.  She has baseline orthopnea and last night used 3 pillows.  No worsening lower extremity edema.  ED Course: Upon presentation to the ER blood pressure was 152/84 with respiratory to 41 pulse oximetry of 94% on 2 L of O2 by nasal cannula which is her baseline and temperature was 97.4.Labs revealed hemoglobin of 12.7 hematocrit 38 compared to 11.6 and 34.7 in August of last year.  Lactic acid was 1.1 high-sensitivity troponin was 14 and later 18.  BNP was 71.  CMP showed BUN of 29 and creatinine 1.15 compared to 17 and 0.52 in August and CO2 is 33.  Magnesium was 1.6 and potassium 3.7.  INR is 1.4 and PT  16.9. EKG as reviewed by me :EKG shows sinus rhythm with a rate of 91 with ventricular trigeminy and left atrial enlargement with Q waves in anteroseptal leads Imaging: Chest CT without contrast revealed the following:  Multifocal bronchiectasis, similar to prior examination. No superimposed focal pulmonary infiltrate or mass identified. No central obstructing lesion.  Extensive multi-vessel coronary artery calcification.  Morphologic changes in keeping with pulmonary arterial hypertension.  Large hiatal hernia, similar to prior examination.  Aortic Atherosclerosis.  The patient was given a gram of IV magnesium sulfate, 125 mg IV Solu-Medrol and DuoNeb.  She will be admitted to a progressive unit bed for further evaluation and management.  PAST MEDICAL HISTORY:   Past Medical History:  Diagnosis Date  . Arthritis   . Atrial fibrillation ([place])   . Bronchiectasis ([place])   . CAD (coronary artery disease) 07/30/2017  . Dumping syndrome   . 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 Family history of adverse reaction to anesthesia    sister [name]  . GERD (gastroesophageal reflux disease)   . Headache    MIGRAINES  . Myocardial infarction ([place]) 2007   Non-STEMI  . [name] (postoperative nausea and vomiting)   . Psoriasis   . PUD (peptic ulcer disease)     PAST SURGICAL HISTORY:   Past Surgical History:  Procedure Laterality Date  . BACK SURGERY    . CHOLECYSTECTOMY    .  ESOPHAGOGASTRODUODENOSCOPY (EGD) WITH PROPOFOL N/A 03/19/2019   Procedure: ESOPHAGOGASTRODUODENOSCOPY (EGD) WITH PROPOFOL;  Surgeon: [name], MD;  Location[facility] ENDOSCOPY;  Service: Gastroenterology;  Laterality: N/A;  *Note to anesthesia: Per pt's pulmonologist, if intubating, please extubate to BIPAP.  [name] EYE SURGERY    . FOOT SURGERY    . INTRAMEDULLARY (IM) NAIL INTERTROCHANTERIC Right 09/30/2018   Procedure: INTRAMEDULLARY (IM) NAIL INTERTROCHANTRIC;  Surgeon: Dereck Leep, MD;  Location: [facility]  ORS;  Service: Orthopedics;  Laterality: Right;  . KYPHOPLASTY N/A 07/05/2016   Procedure: KYPHOPLASTY T - 9;  Surgeon: Hessie Knows, MD;  Location: [facility] ORS;  Service: Orthopedics;  Laterality: N/A;  . KYPHOPLASTY N/A 11/29/2017   Procedure: Iona Hansen;  Surgeon: Hessie Knows, MD;  Location: [facility] ORS;  Service: Orthopedics;  Laterality: N/A;  L2 and L3  . KYPHOPLASTY N/A 12/18/2017   Procedure: KYPHOPLASTY L1;  Surgeon: Hessie Knows, MD;  Location: [facility] ORS;  Service: Orthopedics;  Laterality: N/A;  . KYPHOPLASTY N/A 01/05/2018   Procedure: KYPHOPLASTY-T11,T12;  Surgeon: Hessie Knows, MD;  Location: [facility] ORS;  Service: Orthopedics;  Laterality: N/A;  . KYPHOPLASTY N/A 04/05/2018   Procedure: T10 KYPHOPLASTY;  Surgeon: Hessie Knows, MD;  Location: [facility] ORS;  Service: Orthopedics;  Laterality: N/A;  . KYPHOPLASTY N/A 04/12/2018   Procedure: KYPHOPLASTY T7,8;  Surgeon: Hessie Knows, MD;  Location: [facility] ORS;  Service: Orthopedics;  Laterality: N/A;  . KYPHOPLASTY N/A 04/19/2018   Procedure: KYPHOPLASTY T5, T6;  Surgeon: Hessie Knows, MD;  Location: [facility] ORS;  Service: Orthopedics;  Laterality: N/A;  . LUNG SURGERY  1990 and 1996  . THOROCOTOMY WITH LOBECTOMY     LEFT LOWER THORACOTOMY / RIGHT MIDDLE LOBECTOMY    SOCIAL HISTORY:   Social History   Tobacco Use  . Smoking status: Never Smoker  . Smokeless tobacco: Never Used  Substance Use Topics  . Alcohol use: No    FAMILY HISTORY:   Family History  Problem Relation Age of Onset  . Hypertension Mother   . Hypertension Father     DRUG ALLERGIES:   Allergies  Allergen Reactions  . Codeine Nausea And Vomiting  . Sulfa Antibiotics Diarrhea  . Penicillins Rash    Has patient had a PCN reaction causing immediate rash, facial/tongue/throat swelling, SOB or lightheadedness with hypotension: Unknown Has patient had a PCN reaction causing severe rash involving mucus membranes or skin necrosis: Unknown Has patient had a PCN  reaction that required hospitalization: Unknown Has patient had a PCN reaction occurring within the last 10 years: No If all of the above answers are "NO", then may proceed with Cephalosporin use.     REVIEW OF SYSTEMS:   ROS As per history of present illness. All pertinent systems were reviewed above. Constitutional, HEENT, cardiovascular, respiratory, GI, GU, musculoskeletal, neuro, psychiatric, endocrine, integumentary and hematologic systems were reviewed and are otherwise negative/unremarkable except for positive findings mentioned above in the HPI.   MEDICATIONS AT HOME:   Prior to Admission medications   Medication Sig Start Date End Date Taking? Authorizing Provider  acetaminophen (TYLENOL) 500 MG tablet Take 1,000 mg by mouth every 6 (six) hours as needed for mild pain or headache.    Yes [provider]  acetylcysteine (MUCOMYST) 20 % nebulizer solution Take 4 mLs by nebulization daily. 04/22/20   [provider]  albuterol (PROVENTIL) (2.5 MG/3ML) 0.083% nebulizer solution Take 3 mLs (2.5 mg total) by nebulization every 6 (six) hours as needed for shortness of breath. 09/10/19   Enzo Bi,  MD  amLODipine (NORVASC) 5 MG tablet Take 5 mg by mouth daily.  11/04/13   [provider]  apixaban (ELIQUIS) 5 MG TABS tablet Take 5 mg by mouth 2 (two) times daily.     [provider]  Ascorbic Acid (VITAMIN C) 1000 MG tablet Take 1,000 mg by mouth daily.    [provider]  diltiazem (CARDIZEM CD) 120 MG 24 hr capsule Take 120 mg by mouth daily. 07/25/19   [provider]  ferrous sulfate 325 (65 FE) MG EC tablet Take 325 mg by mouth daily.     [provider]  furosemide (LASIX) 20 MG tablet Take 20 mg by mouth daily as needed for edema. 07/08/19   [provider]  gabapentin (NEURONTIN) 300 MG capsule Take 300 mg by mouth 4 (four) times daily.     [provider]  HYDROcodone-homatropine (HYCODAN) 5-1.5 MG/5ML  syrup Take 5 mLs by mouth every 8 (eight) hours as needed for cough. 09/10/19   Enzo Bi, MD  LORazepam (ATIVAN) 0.5 MG tablet Take 1 tablet (0.5 mg total) by mouth at bedtime. 09/10/19   Enzo Bi, MD  losartan (COZAAR) 100 MG tablet Take 100 mg by mouth daily. 07/25/19   [provider]  metoprolol succinate (TOPROL-XL) 100 MG 24 hr tablet Take 1 tablet (100 mg total) by mouth daily. 10/11/18   Hillary Bow, MD  NAC 600 MG CAPS Take 600 mg by mouth daily after breakfast.  08/17/19   [provider]  omeprazole (PRILOSEC) 20 MG capsule Take 20 mg by mouth daily. 08/26/19   [provider]  potassium chloride (K-DUR) 10 MEQ tablet Take 10 mEq by mouth daily.    [provider]  predniSONE (DELTASONE) 10 MG tablet Take 40 mg (4 tablets) on 8/11, then 30 mg (3 tablets) on 8/12, then 20 mg (2 tablets) on 8/13, then 10 mg (1 tablet) on 8/14, and continue to take your maintenance dose of 10 mg daily until you follow up with Dr. Lanney Gins. 09/11/19   Enzo Bi, MD  Probiotic Product (ALIGN) 4 MG CAPS Take 4 mg by mouth daily.    [provider]  Sodium Chloride, Inhalant, 7 % NEBU Inhale 4 mLs into the lungs 3 (three) times daily. 04/05/18   [provider]      VITAL SIGNS:  Blood pressure 119/74, pulse 80, temperature (!) 97.5 F (36.4 C), temperature source Rectal, resp. rate (!) 23, height _0  (1.651 m), weight 50.3 kg, SpO2 96 %.  PHYSICAL EXAMINATION:  Physical Exam  GENERAL:  75 y.o.-year-old Caucasian female patient lying in the bed with mild respiratory distress with conversational dyspnea.   EYES: Pupils equal, round, reactive to light and accommodation. No scleral icterus. Extraocular muscles intact.  HEENT: Head atraumatic, normocephalic. Oropharynx and nasopharynx clear.  NECK:  Supple, no jugular venous distention. No thyroid enlargement, no tenderness.  LUNGS: Diminished bibasal breath sounds with bibasal crackles.   CARDIOVASCULAR:  Regular rate and rhythm, S1, S2 normal. No murmurs, rubs, or gallops.  ABDOMEN: Soft, nondistended, nontender. Bowel sounds present. No organomegaly or mass.  EXTREMITIES: No pedal edema, cyanosis, or clubbing.  NEUROLOGIC: Cranial nerves II through XII are intact. Muscle strength 5/5 in all extremities. Sensation intact. Gait not checked.  PSYCHIATRIC: The patient is alert and oriented x 3.  Normal affect and good eye contact. SKIN: No obvious rash, lesion, or ulcer.   LABORATORY PANEL:   CBC Recent Labs  Lab 04/29/20 0108  WBC 21.6*  HGB 12.7  HCT 38.0  PLT 338   ------------------------------------------------------------------------------------------------------------------  Chemistries  Recent Labs  Lab 04/29/20 0108  NA 138  K 3.7  CL 93*  CO2 33*  GLUCOSE 115*  BUN 29*  CREATININE 1.15*  CALCIUM 8.9  MG 1.6*  AST 26  ALT 13  ALKPHOS 41  BILITOT 0.7   ------------------------------------------------------------------------------------------------------------------  Cardiac Enzymes No results for input(s): TROPONINI in the last 168 hours. ------------------------------------------------------------------------------------------------------------------  RADIOLOGY:  CT Chest W Contrast  Result Date: 04/29/2020 CLINICAL DATA:  Hemoptysis EXAM: CT CHEST WITH CONTRAST TECHNIQUE: Multidetector CT imaging of the chest was performed during intravenous contrast administration. CONTRAST:  79m OMNIPAQUE IOHEXOL 300 MG/ML  SOLN COMPARISON:  09/28/2018 FINDINGS: Cardiovascular: Extensive multi-vessel coronary artery calcification. Mild cardiomegaly, unchanged. No pericardial effusion. The central pulmonary arteries are enlarged in keeping with changes of pulmonary arterial hypertension. Mild atherosclerotic calcification is seen within the thoracic aorta. No aortic aneurysm. Mediastinum/Nodes: Soft tissue within the right paratracheal region represents the unopacified azygos  vein. No pathologic thoracic adenopathy. Thyroid unremarkable. Large hiatal hernia of this present containing the hepatic flexure of the colon, and the majority of the stomach., similar to prior examination. Lungs/Pleura: There is multifocal bronchiectasis noted, most severe within the right upper lobe and lung bases bilaterally. There is marked architectural distortion of the left lower lobe. Bronchiectatic changes appear stable when compared to prior examination. No superimposed focal pulmonary infiltrates. Resection of the lateral segment of the right upper lobe, right middle lobe, and inferior lingula have been performed. No suspicious focal pulmonary nodules or masses. No pneumothorax or pleural effusion. The central airways are patent. Upper Abdomen: Cholecystectomy has been performed. Moderate intra and marked extrahepatic biliary ductal dilation appears stable, not fully assessed on this examination. Musculoskeletal: Multilevel thoracolumbar vertebroplasty has been performed. Remote appearing T3 compression fracture noted with approximately 50% loss of height. No acute bone abnormality. IMPRESSION: Multifocal bronchiectasis, similar to prior examination. No superimposed focal pulmonary infiltrate or mass identified. No central obstructing lesion. Extensive multi-vessel coronary artery calcification. Morphologic changes in keeping with pulmonary arterial hypertension. Large hiatal hernia, similar to prior examination. Aortic Atherosclerosis (ICD10-I70.0). Electronically Signed   By: AFidela SalisburyMD   On: 04/29/2020 05:12   DG Chest Portable 1 View  Result Date: 04/29/2020 CLINICAL DATA:  Shortness of breath and hemoptysis EXAM: PORTABLE CHEST 1 VIEW COMPARISON:  09/08/2019 FINDINGS: Cardiac shadow is stable. Aortic calcifications are again seen. Multilevel vertebral augmentation is noted. Left retrocardiac consolidation is noted stable from the prior exam. Chronic fibrotic changes are noted throughout  both lungs stable from the previous exam. Postsurgical changes are noted. No acute bony abnormality is seen. IMPRESSION: Chronic changes without acute abnormality Electronically Signed   By: MInez CatalinaM.D.   On: 04/29/2020 01:09      IMPRESSION AND PLAN:  Active Problems:   [place]  1.  Bronchiectasis with acute exacerbation with chronic respiratory failure. -The patient will be admitted to a progressive unit bed. -We will continue her on scheduled and as needed duo nebs. -We will place her on antibiotic therapy with IV Rocephin and Zithromax -We will obtain sputum culture as well as stool cultures. -O2 protocol will be followed. -Mucolytic therapy will be provided. -The patient will be hydrated with IV normal saline.  2.  Hemoptysis, likely secondary to #1. -Follow serial hemoglobins and hematocrits. -Pulmonary consultation will be obtained as she may need a bronchoscopy during this admission. -We will  hold off her Eliquis -I notified Dr. Lanney Gins who is aware about the patient.  3.  Essential hypertension. -We will continue her antihypertensives..  4.  Mild acute kidney injury. -The patient will be hydrated with IV normal saline and will hold nephrotoxins.  5.  Anxiety. -We will continue Ativan.  6.  GERD. -We will continue PPI therapy.  DVT prophylaxis: SCDs.  Medical prophylaxis currently contraindicated due to hemoptysis. Code Status: full code. Family Communication:  The plan of care was discussed in details with the patient (and family). I answered all questions. The patient agreed to proceed with the above mentioned plan. Further management will depend upon hospital course. Disposition Plan: Back to previous home environment Consults called: Pulmonary consult to Dr. Lanney Gins. All the records are reviewed and case discussed with ED provider.  Status is: Inpatient  Remains inpatient appropriate because:Ongoing active pain  requiring inpatient pain management, Ongoing diagnostic testing needed not appropriate for outpatient work up, Unsafe d/c plan, IV treatments appropriate due to intensity of illness or inability to take PO and Inpatient level of care appropriate due to severity of illness   Dispo: The patient is from: Home              Anticipated d/c is to: Home              Patient currently is not medically stable to d/c.   Difficult to place patient No      TOTAL TIME TAKING CARE OF THIS PATIENT: 55 minutes.    Christel Mormon M.D on 04/29/2020 at 5:55 AM  [facility]   From 7 PM-7 AM, contact night-coverage [email or web address]  CC: Primary care physician; [name], MD

## 2020-04-29 NOTE — ED Provider Notes (Signed)
[facility] Emergency Department Provider Note  ____________________________________________   Event Date/Time   First MD Initiated Contact with Patient 04/29/20 [phone]     (approximate)  I have reviewed the triage vital signs and the nursing notes.   HISTORY  Chief Complaint Hemoptysis    HPI Ana Washington is a 75 y.o. 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  States yesterday she was seen by her pulmonologist, Dr. Lanney Gins, and was found to be hypotensive (87/63) and was given 500 mL IV fluids and 20 mg IM solu-medrol She states she was discharged on prednisone 20 mg.  It appears she was also discharged with Levaquin.  She reports taking 10 mg of prednisone chronically as well.  She states today she feels like she has fluid in her abdomen and lungs.  She states that she feels that there is a tight band around her upper abdomen.  No known fevers.  She states she did cough up "4 handfuls" of bright red blood.  She denies history of PE or DVT.  No lower extremity swelling or pain.  She reports trying to use her nebulizer at home without much relief.        Past Medical History:  Diagnosis Date  . Arthritis   . Atrial fibrillation ([place])   . Bronchiectasis ([place])   . CAD (coronary artery disease) 07/30/2017  . Dumping syndrome   . 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 Family history of adverse reaction to anesthesia    sister [name]  . GERD (gastroesophageal reflux disease)   . Headache    MIGRAINES  . Myocardial infarction ([place]) 2007   Non-STEMI  . [name] (postoperative nausea and vomiting)   . Psoriasis   . PUD (peptic ulcer disease)     Patient Active Problem List   Diagnosis Date Noted  . Bronchiectasis ([place]) 09/08/2019  .  Atrial fibrillation, chronic ([place]) 09/08/2019  . Closed right hip fracture ([place]) 09/27/2018  . Intractable nausea and vomiting 04/08/2018  . Atrial fibrillation with RVR ([place]) 04/08/2018  . Thoracic radiculitis (Bilateral) 03/29/2018  . Vasovagal episode 03/29/2018  . Closed compression fracture of T10 thoracic vertebra, sequela 03/29/2018  . Abnormal MRI, lumbar spine (12/15/2016) 03/21/2018  . Lumbar compression fractures, sequela (L1, L2, L3, L4, and L5) 03/21/2018  . Closed compression fracture of L1 lumbar vertebra, sequela 03/21/2018  . Closed compression fracture of L2 lumbar vertebra, sequela 03/21/2018  . Closed compression fracture of L3 lumbar vertebra, sequela 03/21/2018  . Closed compression fracture of L4 lumbar vertebra, sequela 03/21/2018  . Closed compression fracture of L5 lumbar vertebra, sequela 03/21/2018  . Thoracic compression fracture, sequela (T5, T9, T10, T11, and T12) 03/21/2018  . Closed compression fracture of T5 thoracic vertebra, sequela 03/21/2018  . Closed compression fracture of T9 thoracic vertebra, sequela 03/21/2018  . Close compression fracture of T11 thoracic vertebra, sequela 03/21/2018  . Closed compression fracture of T12 thoracic vertebra, sequela 03/21/2018  . Lumbar facet hypertrophy 03/21/2018  . Grade 1  Lumbar Anterolisthesis of L3/4 and L4/5 03/21/2018  . Lumbar central spinal stenosis (Multilevel), 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 claudication 03/21/2018  . Chronic anticoagulation (ELIQUIS) 03/21/2018  . History of pelvic fracture 03/21/2018  . Chronic musculoskeletal pain 03/21/2018  . Neurogenic pain 03/21/2018  . Long term prescription benzodiazepine use  03/21/2018  . 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 Adult bronchiectasis ([place]) 03/01/2018  . Diverticulitis 03/01/2018  . 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[place]) 03/01/2018  . Migraines 03/01/2018  . 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 Osteoporosis, post-menopausal  03/01/2018  . Psoriasis 03/01/2018  . Chronic upper back pain (Primary Area of Pain) (Bilateral) (R>L) 03/01/2018  . Chronic low back pain (Secondary Area of Pain) (Bilateral) (R>L) w/o sciatica 03/01/2018  . Chronic pain syndrome 03/01/2018  . Long term current use of opiate analgesic 03/01/2018  . Pharmacologic therapy 03/01/2018  . Disorder of skeletal system 03/01/2018  . Problems influencing health status 03/01/2018  . History of kyphoplasty (L1, L2, L3, T9, T11, and T12) 01/05/2018  . Malnutrition of moderate degree ([place]) 08/03/2017  . GERD (gastroesophageal reflux disease) 07/30/2017  . Pelvic fracture ([place]) 07/30/2017  . Paroxysmal atrial fibrillation ([place]) 07/30/2017  . Other dysphagia 09/13/2016  . Unintended weight loss 09/13/2016  . Essential hypertension 10/03/2013  . Osteoarthritis of knees (Bilateral) 10/03/2013  . Primary localized osteoarthrosis, lower leg 10/03/2013  . Non-ischemic cardiomyopathy ([place]) 09/12/2013  . Coronary artery disease involving native coronary artery of native heart without angina pectoris 07/28/2012  . Hyperlipidemia, mixed 07/28/2012  . DDD (degenerative disc disease), lumbar 02/29/2012    Past Surgical History:  Procedure Laterality Date  . BACK SURGERY    . CHOLECYSTECTOMY    . ESOPHAGOGASTRODUODENOSCOPY (EGD) WITH PROPOFOL N/A 03/19/2019   Procedure: ESOPHAGOGASTRODUODENOSCOPY (EGD) WITH PROPOFOL;  Surgeon: [name], MD;  Location: [facility] ENDOSCOPY;  Service: Gastroenterology;  Laterality: N/A;  *Note to anesthesia: Per pt's pulmonologist, if intubating, please extubate to BIPAP.  [name] EYE SURGERY    . FOOT SURGERY    . INTRAMEDULLARY (IM) NAIL INTERTROCHANTERIC Right 09/30/2018   Procedure: INTRAMEDULLARY (IM) NAIL INTERTROCHANTRIC;  Surgeon: Dereck Leep, MD;  Location: [facility] ORS;  Service: Orthopedics;  Laterality: Right;  . KYPHOPLASTY N/A 07/05/2016   Procedure: KYPHOPLASTY T - 9;  Surgeon: Hessie Knows, MD;  Location: [facility] ORS;   Service: Orthopedics;  Laterality: N/A;  . KYPHOPLASTY N/A 11/29/2017   Procedure: Iona Hansen;  Surgeon: Hessie Knows, MD;  Location: [facility] ORS;  Service: Orthopedics;  Laterality: N/A;  L2 and L3  . KYPHOPLASTY N/A 12/18/2017   Procedure: KYPHOPLASTY L1;  Surgeon: Hessie Knows, MD;  Location: [facility] ORS;  Service: Orthopedics;  Laterality: N/A;  . KYPHOPLASTY N/A 01/05/2018   Procedure: KYPHOPLASTY-T11,T12;  Surgeon: Hessie Knows, MD;  Location: [facility] ORS;  Service: Orthopedics;  Laterality: N/A;  . KYPHOPLASTY N/A 04/05/2018   Procedure: T10 KYPHOPLASTY;  Surgeon: Hessie Knows, MD;  Location: [facility] ORS;  Service: Orthopedics;  Laterality: N/A;  . KYPHOPLASTY N/A 04/12/2018   Procedure: KYPHOPLASTY T7,8;  Surgeon: Hessie Knows, MD;  Location: [facility] ORS;  Service: Orthopedics;  Laterality: N/A;  . KYPHOPLASTY N/A 04/19/2018   Procedure: KYPHOPLASTY T5, T6;  Surgeon: Hessie Knows, MD;  Location: [facility] ORS;  Service: Orthopedics;  Laterality: N/A;  . LUNG SURGERY  1990 and 1996  . THOROCOTOMY WITH LOBECTOMY     LEFT LOWER THORACOTOMY / RIGHT MIDDLE LOBECTOMY    Prior to Admission medications   Medication Sig Start Date End Date Taking? Authorizing Provider  acetaminophen (TYLENOL) 500 MG tablet Take 1,000 mg by mouth every 6 (six) hours as needed for mild pain or headache.     [provider]  albuterol (PROVENTIL) (2.5 MG/3ML) 0.083% nebulizer solution Take 3 mLs (2.5 mg total) by nebulization every 6 (six) hours as needed for shortness of  breath. 09/10/19   Enzo Bi, MD  amLODipine (NORVASC) 5 MG tablet Take 5 mg by mouth daily.  11/04/13   [provider]  apixaban (ELIQUIS) 5 MG TABS tablet Take 5 mg by mouth 2 (two) times daily.     [provider]  Ascorbic Acid (VITAMIN C) 1000 MG tablet Take 1,000 mg by mouth daily.    [provider]  diltiazem (CARDIZEM CD) 120 MG 24 hr capsule Take 120 mg by mouth daily. 07/25/19   [provider]   ferrous sulfate 325 (65 FE) MG EC tablet Take 325 mg by mouth daily.     [provider]  furosemide (LASIX) 20 MG tablet Take 20 mg by mouth daily as needed for edema. 07/08/19   [provider]  gabapentin (NEURONTIN) 300 MG capsule Take 300 mg by mouth 4 (four) times daily.     [provider]  HYDROcodone-homatropine (HYCODAN) 5-1.5 MG/5ML syrup Take 5 mLs by mouth every 8 (eight) hours as needed for cough. 09/10/19   Enzo Bi, MD  LORazepam (ATIVAN) 0.5 MG tablet Take 1 tablet (0.5 mg total) by mouth at bedtime. 09/10/19   Enzo Bi, MD  losartan (COZAAR) 100 MG tablet Take 100 mg by mouth daily. 07/25/19   [provider]  metoprolol succinate (TOPROL-XL) 100 MG 24 hr tablet Take 1 tablet (100 mg total) by mouth daily. 10/11/18   Hillary Bow, MD  NAC 600 MG CAPS Take 600 mg by mouth daily after breakfast.  08/17/19   [provider]  omeprazole (PRILOSEC) 20 MG capsule Take 20 mg by mouth daily. 08/26/19   [provider]  potassium chloride (K-DUR) 10 MEQ tablet Take 10 mEq by mouth daily.    [provider]  predniSONE (DELTASONE) 10 MG tablet Take 40 mg (4 tablets) on 8/11, then 30 mg (3 tablets) on 8/12, then 20 mg (2 tablets) on 8/13, then 10 mg (1 tablet) on 8/14, and continue to take your maintenance dose of 10 mg daily until you follow up with Dr. Lanney Gins. 09/11/19   Enzo Bi, MD  Probiotic Product (ALIGN) 4 MG CAPS Take 4 mg by mouth daily.    [provider]  Sodium Chloride, Inhalant, 7 % NEBU Inhale 4 mLs into the lungs 3 (three) times daily. 04/05/18   [provider]    Allergies Codeine, Sulfa antibiotics, and Penicillins  Family History  Problem Relation Age of Onset  . Hypertension Mother   . Hypertension Father     Social History Social History   Tobacco Use  . Smoking status: Never Smoker  . Smokeless tobacco: Never Used  Vaping Use  . Vaping Use: Never used  Substance Use Topics   . Alcohol use: No  . Drug use: No    Review of Systems Constitutional: No fever. Eyes: No visual changes. ENT: No sore throat. Cardiovascular: Denies chest pain. Respiratory: + shortness of breath. Gastrointestinal: No nausea, vomiting, diarrhea. Genitourinary: Negative for dysuria. Musculoskeletal: Negative for back pain. Skin: Negative for rash. Neurological: Negative for focal weakness or numbness.  ____________________________________________   PHYSICAL EXAM:  VITAL SIGNS: ED Triage Vitals  Enc Vitals Group     BP 04/29/20 0058 (!) 152/84     Pulse Rate 04/29/20 0058 96     Resp 04/29/20 0058 (!) 40     Temp 04/29/20 0058 (!) 97.4 F (36.3 C)     Temp Source 04/29/20 0058 Oral     SpO2 04/29/20 0058  94 %     Weight 04/29/20 0059 111 lb (50.3 kg)     Height 04/29/20 0059 _0  (1.651 m)     Head Circumference --      Peak Flow --      Pain Score 04/29/20 0059 0     Pain Loc --      Pain Edu? --      Excl. in [place]? --    CONSTITUTIONAL: Alert and oriented and responds appropriately to questions.  Elderly.  In mild to moderate respiratory distress. HEAD: Normocephalic EYES: Conjunctivae clear, pupils appear equal, EOM appear intact ENT: normal nose; moist mucous membranes NECK: Supple, normal ROM CARD: RRR; S1 and S2 appreciated; no murmurs, no clicks, no rubs, no gallops RESP: Patient is significantly tachypneic.  No hypoxia on 2 L.  Diffuse inspiratory and expiratory wheezes.  Diminished at bases bilaterally.  No rhonchi or rales. ABD/GI: Normal bowel sounds; non-distended; soft, non-tender, no rebound, no guarding, no peritoneal signs, no hepatosplenomegaly BACK: The back appears normal EXT: Normal ROM in all joints; no deformity noted, no edema; no cyanosis, no calf tenderness or calf swelling SKIN: Normal color for age and race; warm; no rash on exposed skin NEURO: Moves all extremities equally PSYCH: The patient's mood and manner are  appropriate.  ____________________________________________   LABS (all labs ordered are listed, but only abnormal results are displayed)  Labs Reviewed  CBC WITH DIFFERENTIAL/PLATELET - Abnormal; Notable for the following components:      Result Value   WBC 21.6 (*)    Neutro Abs 19.1 (*)    Monocytes Absolute 1.4 (*)    Abs Immature Granulocytes 0.20 (*)    All other components within normal limits  COMPREHENSIVE METABOLIC PANEL - Abnormal; Notable for the following components:   Chloride 93 (*)    CO2 33 (*)    Glucose, Bld 115 (*)    BUN 29 (*)    Creatinine, Ser 1.15 (*)    GFR, Estimated 50 (*)    All other components within normal limits  PROTIME-INR - Abnormal; Notable for the following components:   Prothrombin Time 16.9 (*)    INR 1.4 (*)    All other components within normal limits  MAGNESIUM - Abnormal; Notable for the following components:   Magnesium 1.6 (*)    All other components within normal limits  BLOOD GAS, VENOUS - Abnormal; Notable for the following components:   pCO2, Ven 64 (*)    pO2, Ven <31.0 (*)    Bicarbonate 40.6 (*)    Acid-Base Excess 13.2 (*)    All other components within normal limits  TROPONIN I (HIGH SENSITIVITY) - Abnormal; Notable for the following components:   Troponin I (High Sensitivity) 18 (*)    All other components within normal limits  RESP PANEL BY RT-PCR (FLU A&B, COVID) ARPGX2  CULTURE, BLOOD (ROUTINE X 2)  CULTURE, BLOOD (ROUTINE X 2)  URINE CULTURE  LIPASE, BLOOD  BRAIN NATRIURETIC PEPTIDE  APTT  D-DIMER, QUANTITATIVE  LACTIC ACID, PLASMA  URINALYSIS, ROUTINE W REFLEX MICROSCOPIC  TYPE AND SCREEN  TROPONIN I (HIGH SENSITIVITY)   ____________________________________________  EKG   EKG Interpretation  Date/Time:  Wednesday April 29 2020 01:01:13 EDT Ventricular Rate:  91 PR Interval:  194 QRS Duration: 97 QT Interval:  374 QTC Calculation: 461 R Axis:   4 Text Interpretation: Sinus rhythm Ventricular  trigeminy Probable left atrial enlargement Probable anterior infarct, recent Confirmed by Jenalee Trevizo, Cyril Mourning [phone]) on 04/29/2020  1:04:48 AM       ____________________________________________  RADIOLOGY Jessie Foot Calyn Rubi, personally viewed and evaluated these images (plain radiographs) as part of my medical decision making, as well as reviewing the written report by the radiologist.  ED MD interpretation: Chest x-ray clear.  CT scan shows bronchiectasis.  Official radiology report(s): CT Chest W Contrast  Result Date: 04/29/2020 CLINICAL DATA:  Hemoptysis EXAM: CT CHEST WITH CONTRAST TECHNIQUE: Multidetector CT imaging of the chest was performed during intravenous contrast administration. CONTRAST:  20m OMNIPAQUE IOHEXOL 300 MG/ML  SOLN COMPARISON:  09/28/2018 FINDINGS: Cardiovascular: Extensive multi-vessel coronary artery calcification. Mild cardiomegaly, unchanged. No pericardial effusion. The central pulmonary arteries are enlarged in keeping with changes of pulmonary arterial hypertension. Mild atherosclerotic calcification is seen within the thoracic aorta. No aortic aneurysm. Mediastinum/Nodes: Soft tissue within the right paratracheal region represents the unopacified azygos vein. No pathologic thoracic adenopathy. Thyroid unremarkable. Large hiatal hernia of this present containing the hepatic flexure of the colon, and the majority of the stomach., similar to prior examination. Lungs/Pleura: There is multifocal bronchiectasis noted, most severe within the right upper lobe and lung bases bilaterally. There is marked architectural distortion of the left lower lobe. Bronchiectatic changes appear stable when compared to prior examination. No superimposed focal pulmonary infiltrates. Resection of the lateral segment of the right upper lobe, right middle lobe, and inferior lingula have been performed. No suspicious focal pulmonary nodules or masses. No pneumothorax or pleural effusion. The central  airways are patent. Upper Abdomen: Cholecystectomy has been performed. Moderate intra and marked extrahepatic biliary ductal dilation appears stable, not fully assessed on this examination. Musculoskeletal: Multilevel thoracolumbar vertebroplasty has been performed. Remote appearing T3 compression fracture noted with approximately 50% loss of height. No acute bone abnormality. IMPRESSION: Multifocal bronchiectasis, similar to prior examination. No superimposed focal pulmonary infiltrate or mass identified. No central obstructing lesion. Extensive multi-vessel coronary artery calcification. Morphologic changes in keeping with pulmonary arterial hypertension. Large hiatal hernia, similar to prior examination. Aortic Atherosclerosis (ICD10-I70.0). Electronically Signed   By: AFidela SalisburyMD   On: 04/29/2020 05:12   DG Chest Portable 1 View  Result Date: 04/29/2020 CLINICAL DATA:  Shortness of breath and hemoptysis EXAM: PORTABLE CHEST 1 VIEW COMPARISON:  09/08/2019 FINDINGS: Cardiac shadow is stable. Aortic calcifications are again seen. Multilevel vertebral augmentation is noted. Left retrocardiac consolidation is noted stable from the prior exam. Chronic fibrotic changes are noted throughout both lungs stable from the previous exam. Postsurgical changes are noted. No acute bony abnormality is seen. IMPRESSION: Chronic changes without acute abnormality Electronically Signed   By: MInez CatalinaM.D.   On: 04/29/2020 01:09    ____________________________________________   PROCEDURES  Procedure(s) performed (including Critical Care):  Procedures  CRITICAL CARE Performed by: KCyril MourningWard   Total critical care time: 65 minutes  Critical care time was exclusive of separately billable procedures and treating other patients.  Critical care was necessary to treat or prevent imminent or life-threatening deterioration.  Critical care was time spent personally by me on the following activities:  development of treatment plan with patient and/or surrogate as well as nursing, discussions with consultants, evaluation of patient's response to treatment, examination of patient, obtaining history from patient or surrogate, ordering and performing treatments and interventions, ordering and review of laboratory studies, ordering and review of radiographic studies, pulse oximetry and re-evaluation of patient's condition.  ____________________________________________   INITIAL IMPRESSION / ASSESSMENT AND PLAN / ED COURSE  As part of my medical decision making, I  reviewed the following data within the [place] notes reviewed and incorporated, Labs reviewed , EKG interpreted , Old EKG reviewed, Radiograph reviewed , Discussed with admitting physician  and Notes from prior ED visits         Patient here with shortness of breath, wheezing, hemoptysis.  Differential includes bronchiectasis with acute exacerbation, pneumonia, PE, alveolar hemorrhage, CHF.  Will give breathing treatments, steroids given she is wheezing at this time.  She reports coughing up 4 handfuls of blood at home.  She is only coughing up very small amounts of bright red blood here.  She is hemodynamically stable.  Will obtain labs, chest x-ray, troponin and D-dimer, ABG.  Patient will need admission.  ED PROGRESS  Patient's labs show leukocytosis of 21,000 with left shift.  This may be from recent steroids but will check rectal temperature and obtain cultures, urine to rule out infectious etiology.  Patient's rectal temp is normal.  Normal lactic.  Troponin 14 initially and then slightly elevated at 18.  BNP 71.  D-dimer negative.  CT chest shows multifocal bronchiectasis similar to previous examination with no infiltrate or mass identified.  No signs of active hemorrhaging.  She continues to cough up small amounts of blood but is hemodynamically stable.  Her symptoms have improved with breathing  treatments, steroids.  Will admit for an acute exacerbation of her bronchiectasis and further monitoring of her hemoptysis.   5:39 AM Discussed patient's case with hospitalist, Dr. Sidney Ace.  I have recommended admission and patient (and family if present) agree with this plan. Admitting physician will place admission orders.   I reviewed all nursing notes, vitals, pertinent previous records and reviewed/interpreted all EKGs, lab and urine results, imaging (as available).   ____________________________________________   FINAL CLINICAL IMPRESSION(S) / ED DIAGNOSES  Final diagnoses:  Hemoptysis  Bronchiectasis with acute exacerbation [facility])     ED Discharge Orders    None      *Please note:  Ana Washington was evaluated in Emergency Department on 04/29/2020 for the symptoms described in the history of present illness. She was evaluated in the context of the global COVID-19 pandemic, which necessitated consideration that the patient might be at risk for infection with the SARS-CoV-2 virus that causes COVID-19. Institutional protocols and algorithms that pertain to the evaluation of patients at risk for COVID-19 are in a state of rapid change based on information released by regulatory bodies including the CDC and federal and state organizations. These policies and algorithms were followed during the patient's care in the ED.  Some ED evaluations and interventions may be delayed as a result of limited staffing during and the pandemic.*   Note:  This document was prepared using Dragon voice recognition software and may include unintentional dictation errors.   Perri Lamagna, Delice Bison, DO 04/29/20 [phone]

## 2020-04-29 NOTE — Progress Notes (Signed)
Patient ID: Ana Washington, female   DOB: 02-11-1945, 75 y.o.   MRN: [id number] [facility] PROGRESS NOTE  Ana Washington BWG:[id number] DOB: 1945/06/14 DOA: 04/29/2020 PCP: [name], MD  HPI/Subjective: Patient states that she coughed up quite a bit of blood last night.  She states that she had 4 Kleenex is full.  She states that she is never coughed up this much blood before.  Is on Eliquis at home for atrial fibrillation.  She feels like she has a little bit of a fever.  On Friday she said she went into A. fib and took her medications.  On Sunday had some chills.  Having some acid reflux.  Objective: Vitals:   04/29/20 1100 04/29/20 1217  BP: 123/70   Pulse: 70   Resp: 16   Temp:  98 F (36.7 C)  SpO2: 99%     Intake/Output Summary (Last 24 hours) at 04/29/2020 1257 Last data filed at 04/29/2020 1779 Gross per 24 hour  Intake 450 ml  Output --  Net 450 ml   Filed Weights   04/29/20 0059  Weight: 50.3 kg    ROS: Review of Systems  Respiratory: Positive for cough, hemoptysis and shortness of breath.   Cardiovascular: Negative for chest pain.  Gastrointestinal: Negative for abdominal pain, nausea and vomiting.   Exam: Physical Exam HENT:     Head: Normocephalic.     Mouth/Throat:     Pharynx: No oropharyngeal exudate.  Eyes:     General: Lids are normal.     Conjunctiva/sclera: Conjunctivae normal.     Pupils: Pupils are equal, round, and reactive to light.  Cardiovascular:     Rate and Rhythm: Normal rate and regular rhythm.     Heart sounds: Normal heart sounds, S1 normal and S2 normal.  Pulmonary:     Breath sounds: Examination of the right-lower field reveals decreased breath sounds. Examination of the left-lower field reveals decreased breath sounds. Decreased breath sounds present. No wheezing, rhonchi or rales.  Abdominal:     Palpations: Abdomen is soft.     Tenderness: There is no abdominal tenderness.  Musculoskeletal:     Right lower leg: No  swelling.     Left lower leg: No swelling.  Skin:    General: Skin is warm.     Findings: No rash.  Neurological:     Mental Status: She is alert and oriented to person, place, and time.       Data Reviewed: Basic Metabolic Panel: Recent Labs  Lab 04/29/20 0108  NA 138  K 3.7  CL 93*  CO2 33*  GLUCOSE 115*  BUN 29*  CREATININE 1.15*  CALCIUM 8.9  MG 1.6*   Liver Function Tests: Recent Labs  Lab 04/29/20 0108  AST 26  ALT 13  ALKPHOS 41  BILITOT 0.7  PROT 6.6  ALBUMIN 3.8   Recent Labs  Lab 04/29/20 0108  LIPASE 42   CBC: Recent Labs  Lab 04/29/20 0108 04/29/20 0603  WBC 21.6*  --   NEUTROABS 19.1*  --   HGB 12.7 12.1  HCT 38.0 35.5*  MCV 97.9  --   PLT 338  --    BNP (last 3 results) Recent Labs    09/08/19 0650 04/29/20 0108  BNP 101.3* 71.0      Recent Results (from the past 240 hour(s))  Resp Panel by RT-PCR (Flu A&B, Covid) Nasopharyngeal Swab     Status: None   Collection Time: 04/29/20  1:08  AM   Specimen: Nasopharyngeal Swab; Nasopharyngeal(NP) swabs in vial transport medium  Result Value Ref Range Status   SARS Coronavirus 2 by RT PCR NEGATIVE NEGATIVE Final    Comment: (NOTE) SARS-CoV-2 target nucleic acids are NOT DETECTED.  The SARS-CoV-2 RNA is generally detectable in upper respiratory specimens during the acute phase of infection. The lowest concentration of SARS-CoV-2 viral copies this assay can detect is 138 copies/mL. A negative result does not preclude SARS-Cov-2 infection and should not be used as the sole basis for treatment or other patient management decisions. A negative result may occur with  improper specimen collection/handling, submission of specimen other than nasopharyngeal swab, presence of viral mutation(s) within the areas targeted by this assay, and inadequate number of viral copies(<138 copies/mL). A negative result must be combined with clinical observations, patient history, and  epidemiological information. The expected result is Negative.  Fact Sheet for Patients:  EntrepreneurPulse.com.au  Fact Sheet for Healthcare Providers:  IncredibleEmployment.be  This test is no t yet approved or cleared by the [place] FDA and  has been authorized for detection and/or diagnosis of SARS-CoV-2 by FDA under an Emergency Use Authorization (EUA). This EUA will remain  in effect (meaning this test can be used) for the duration of the COVID-19 declaration under Section 564(b)(1) of the Act, 21 U.S.C.section 360bbb-3(b)(1), unless the authorization is terminated  or revoked sooner.       Influenza A by PCR NEGATIVE NEGATIVE Final   Influenza B by PCR NEGATIVE NEGATIVE Final    Comment: (NOTE) The Xpert Xpress SARS-CoV-2/FLU/RSV plus assay is intended as an aid in the diagnosis of influenza from Nasopharyngeal swab specimens and should not be used as a sole basis for treatment. Nasal washings and aspirates are unacceptable for Xpert Xpress SARS-CoV-2/FLU/RSV testing.  Fact Sheet for Patients: EntrepreneurPulse.com.au  Fact Sheet for Healthcare Providers: IncredibleEmployment.be  This test is not yet approved or cleared by the [place] FDA and has been authorized for detection and/or diagnosis of SARS-CoV-2 by FDA under an Emergency Use Authorization (EUA). This EUA will remain in effect (meaning this test can be used) for the duration of the COVID-19 declaration under Section 564(b)(1) of the Act, 21 U.S.C. section 360bbb-3(b)(1), unless the authorization is terminated or revoked.  Performed at [facility], [address] [place], [place] [address]   Culture, blood (Routine X 2) w Reflex to ID Panel     Status: None (Preliminary result)   Collection Time: 04/29/20  2:35 AM   Specimen: BLOOD  Result Value Ref Range Status   Specimen Description BLOOD RIGHT ANTECUBITAL   Final   Special Requests   Final    BOTTLES DRAWN AEROBIC AND ANAEROBIC Blood Culture adequate volume   Culture   Final    NO GROWTH <12 HOURS Performed at [facility], [address], [place], [place] [address]    Report Status PENDING  Incomplete  Culture, blood (Routine X 2) w Reflex to ID Panel     Status: None (Preliminary result)   Collection Time: 04/29/20  3:08 AM   Specimen: BLOOD  Result Value Ref Range Status   Specimen Description BLOOD LEFT ANTECUBITAL  Final   Special Requests   Final    BOTTLES DRAWN AEROBIC AND ANAEROBIC Blood Culture adequate volume   Culture   Final    NO GROWTH <12 HOURS Performed at [facility], [address], [place], [place] [address]    Report Status PENDING  Incomplete  Studies: CT Chest W Contrast  Result Date: 04/29/2020 CLINICAL DATA:  Hemoptysis EXAM: CT CHEST WITH CONTRAST TECHNIQUE: Multidetector CT imaging of the chest was performed during intravenous contrast administration. CONTRAST:  25m OMNIPAQUE IOHEXOL 300 MG/ML  SOLN COMPARISON:  09/28/2018 FINDINGS: Cardiovascular: Extensive multi-vessel coronary artery calcification. Mild cardiomegaly, unchanged. No pericardial effusion. The central pulmonary arteries are enlarged in keeping with changes of pulmonary arterial hypertension. Mild atherosclerotic calcification is seen within the thoracic aorta. No aortic aneurysm. Mediastinum/Nodes: Soft tissue within the right paratracheal region represents the unopacified azygos vein. No pathologic thoracic adenopathy. Thyroid unremarkable. Large hiatal hernia of this present containing the hepatic flexure of the colon, and the majority of the stomach., similar to prior examination. Lungs/Pleura: There is multifocal bronchiectasis noted, most severe within the right upper lobe and lung bases bilaterally. There is marked architectural distortion of the left lower lobe. Bronchiectatic changes appear stable when compared to prior  examination. No superimposed focal pulmonary infiltrates. Resection of the lateral segment of the right upper lobe, right middle lobe, and inferior lingula have been performed. No suspicious focal pulmonary nodules or masses. No pneumothorax or pleural effusion. The central airways are patent. Upper Abdomen: Cholecystectomy has been performed. Moderate intra and marked extrahepatic biliary ductal dilation appears stable, not fully assessed on this examination. Musculoskeletal: Multilevel thoracolumbar vertebroplasty has been performed. Remote appearing T3 compression fracture noted with approximately 50% loss of height. No acute bone abnormality. IMPRESSION: Multifocal bronchiectasis, similar to prior examination. No superimposed focal pulmonary infiltrate or mass identified. No central obstructing lesion. Extensive multi-vessel coronary artery calcification. Morphologic changes in keeping with pulmonary arterial hypertension. Large hiatal hernia, similar to prior examination. Aortic Atherosclerosis (ICD10-I70.0). Electronically Signed   By: AFidela SalisburyMD   On: 04/29/2020 05:12   DG Chest Portable 1 View  Result Date: 04/29/2020 CLINICAL DATA:  Shortness of breath and hemoptysis EXAM: PORTABLE CHEST 1 VIEW COMPARISON:  09/08/2019 FINDINGS: Cardiac shadow is stable. Aortic calcifications are again seen. Multilevel vertebral augmentation is noted. Left retrocardiac consolidation is noted stable from the prior exam. Chronic fibrotic changes are noted throughout both lungs stable from the previous exam. Postsurgical changes are noted. No acute bony abnormality is seen. IMPRESSION: Chronic changes without acute abnormality Electronically Signed   By: MInez CatalinaM.D.   On: 04/29/2020 01:09    Scheduled Meds: . acetylcysteine  4 mL Nebulization Daily  . acidophilus  1 capsule Oral Daily  . diltiazem  120 mg Oral Daily  . guaiFENesin  600 mg Oral BID  . ipratropium-albuterol  3 mL Nebulization QID  .  LORazepam  0.5 mg Oral QHS  . metoprolol succinate  100 mg Oral Daily  . pantoprazole  40 mg Oral Daily  . tobramycin (PF)  300 mg Nebulization BID   Continuous Infusions: . sodium chloride 100 mL/hr at 04/29/20 0630  . azithromycin Stopped (04/29/20 0823)  . cefTRIAXone (ROCEPHIN)  IV Stopped (04/29/20 0716)    Assessment/Plan:  1. Multifocal bronchiectasis with exacerbation, chronic respiratory failure.  Change antibiotics from Rocephin to ceftazidime.  Get a sputum culture.  Continue Zithromax.  Continue oxygen supplementation.  Nebulizer treatments.  Covid 19 testing negative.  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 2. Hemoptysis.  Holding off Eliquis.  Pulmonary consult.  Serial hemoglobins. 3. Acute kidney injury.  Creatinine up at 1.15.  On IV fluids.  Baseline creatinine 0.52.  Check BMP tomorrow. 4. Essential hypertension.  Restart Toprol and Cardizem CD 5. Paroxysmal atrial fibrillation  on diltiazem and Toprol.  Holding off on Eliquis with atrial fibrillation. 6. Anxiety on Ativan 7. GERD.  On Protonix here.  Normally takes omeprazole at home 8. CT scan showing signs of pulmonary hypertension.  Will obtain an echocardiogram.     Code Status:     Code Status Orders  (From admission, onward)         Start     Ordered   04/29/20 0549  Do not attempt resuscitation (DNR)  Continuous       Question Answer Comment  In the event of cardiac or respiratory ARREST Do not call a "code blue"   In the event of cardiac or respiratory ARREST Do not perform Intubation, CPR, defibrillation or ACLS   In the event of cardiac or respiratory ARREST Use medication by any route, position, wound care, and other measures to relive pain and suffering. May use oxygen, suction and manual treatment of airway obstruction as needed for comfort.      04/29/20 0551        Code Status History    Date Active Date Inactive Code Status Order ID Comments User Context   09/08/2019 0853 09/10/2019  2215 DNR [id number]  Karmen Bongo, MD ED   09/27/2018 1816 10/11/2018 1818 DNR [id number]  Demetrios Loll, MD ED   04/19/2018 1147 04/19/2018 1549 Full Code [id number]  Hessie Knows, MD Inpatient   04/08/2018 1817 04/14/2018 1637 DNR [id number]  Tyler Pita, MD Inpatient   04/08/2018 0752 04/08/2018 1817 Full Code [id number]  Harrie Foreman, MD Inpatient   04/05/2018 1455 04/05/2018 1831 Full Code [id number]  Hessie Knows, MD Inpatient   01/05/2018 1836 01/06/2018 1504 Full Code [id number]  Hessie Knows, MD Inpatient   12/18/2017 1741 12/18/2017 2125 Full Code [id number]  Hessie Knows, MD Inpatient   11/29/2017 1555 11/29/2017 2013 Full Code [id number]  Hessie Knows, MD Inpatient   07/31/2017 1054 08/03/2017 1524 DNR [id number]  Demetrios Loll, MD Inpatient   07/31/2017 0057 07/31/2017 1054 Full Code [id number]  Lance Coon, MD Inpatient   07/05/2016 0931 07/05/2016 1311 Full Code [id number]  Hessie Knows, MD Inpatient   Advance Care Planning Activity     Family Communication: Left message for husband Disposition Plan: Status is: Inpatient  Dispo: The patient is from: Home              Anticipated d/c is to: Home              Patient currently watching closely with hemoptysis and treating bronchiectasis exacerbation.   Difficult to place patient.  No.  Consultants:  Pulmonary  Antibiotics:  Ceftazidime  Zithromax  Time spent: 32 minutes, case discussed with nursing staff  Loletha Grayer  [facility]

## 2020-04-29 NOTE — Progress Notes (Addendum)
Pharmacy Antibiotic Note  Ana Washington is a 75 y.o. female admitted on 04/29/2020. Pharmacy originally consulted for ceftazidime dosing. Antibiotics changed to amikacin per Pulmonology.  Plan: Amikacin 800 mg IV x 1. Will follow up further antibiotic plan and order labs as indicated.  Height: '5\' 5"'$  (165.1 cm) Weight: 50.3 kg (111 lb) IBW/kg (Calculated) : 57  Temp (24hrs), Avg:97.6 F (36.4 C), Min:97.4 F (36.3 C), Max:98 F (36.7 C)  Recent Labs  Lab 04/29/20 0108 04/29/20 0235  WBC 21.6*  --   CREATININE 1.15*  --   LATICACIDVEN  --  1.1    Estimated Creatinine Clearance: 33.6 mL/min (A) (by C-G formula based on SCr of 1.15 mg/dL (H)).    Allergies  Allergen Reactions  . Codeine Nausea And Vomiting  . Sulfa Antibiotics Diarrhea  . Penicillins Rash    Has patient had a PCN reaction causing immediate rash, facial/tongue/throat swelling, SOB or lightheadedness with hypotension: Unknown Has patient had a PCN reaction causing severe rash involving mucus membranes or skin necrosis: Unknown Has patient had a PCN reaction that required hospitalization: Unknown Has patient had a PCN reaction occurring within the last 10 years: No If all of the above answers are "NO", then may proceed with Cephalosporin use.     Antimicrobials this admission: Ceftriaxone 3/30 x 1 Azithromycin 3/30 x 1 Amikacin 3/30 >>   Microbiology results: 3/30 BCx: NGTD 3/30 UCx: pending    Thank you for allowing pharmacy to be a part of this patient's care.  Tawnya Crook, PharmD 04/29/2020 1:07 PM

## 2020-04-29 NOTE — ED Notes (Signed)
No acute changes-- pt remains awake and alert; GCS 15 with daughter at bedside.  RR even and unlabored on 2L O2 via [place] with intermittent ongoing hemoptysis.  Continuous cardiac and pulse ox maintained.  Pt agreeable with plan for admission -- awaits bed assignment at this time.

## 2020-04-29 NOTE — ED Triage Notes (Signed)
Pt arrives from home via [place] with c/o hemoptysis episodes tonight - has since resolved per patient.  O2 dependent on 2L O2 via [place]- O2 sats stable enroute 93-97%. Pt denied cp, sob, other complaints.  H/O Afib- lung disorder

## 2020-04-29 NOTE — Consult Note (Signed)
Pulmonary Medicine          Date: 04/29/2020,   MRN# [id number] OLIVIA CARULLI Aug 11, 1945     AdmissionWeight: 50.3 kg                 CurrentWeight: 50.3 kg    Referring physician: Dr Sidney Ace  CHIEF COMPLAINT:   Acute on chronic hypoxemic respiratory failure   HISTORY OF PRESENT ILLNESS   Very pleasant 75 yo well known to our service with recurrent respiratory infections and chronic brnochitis from advanced sacular cavitary bronchiectasis with MAI and recurrent overgrowth of flora and episodic non-massive hemoptysis. She was seen by me in office with hypotension and severe cough.  She mildly improved but not good enough with continued blood streaked expectorate.    PAST MEDICAL HISTORY   Past Medical History:  Diagnosis Date  . Arthritis   . Atrial fibrillation ([place])   . Bronchiectasis ([place])   . CAD (coronary artery disease) 07/30/2017  . Dumping syndrome   . 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 Family history of adverse reaction to anesthesia    sister [name]  . GERD (gastroesophageal reflux disease)   . Headache    MIGRAINES  . Myocardial infarction ([place]) 2007   Non-STEMI  . [name] (postoperative nausea and vomiting)   . Psoriasis   . PUD (peptic ulcer disease)      SURGICAL HISTORY   Past Surgical History:  Procedure Laterality Date  . BACK SURGERY    . CHOLECYSTECTOMY    . ESOPHAGOGASTRODUODENOSCOPY (EGD) WITH PROPOFOL N/A 03/19/2019   Procedure: ESOPHAGOGASTRODUODENOSCOPY (EGD) WITH PROPOFOL;  Surgeon: [name], MD;  Location: [facility] ENDOSCOPY;  Service: Gastroenterology;  Laterality: N/A;  *Note to anesthesia: Per pt's pulmonologist, if intubating, please extubate to BIPAP.  [name] EYE SURGERY    . FOOT SURGERY    . INTRAMEDULLARY (IM) NAIL INTERTROCHANTERIC Right 09/30/2018   Procedure: INTRAMEDULLARY (IM) NAIL INTERTROCHANTRIC;  Surgeon: Dereck Leep, MD;  Location: [facility] ORS;  Service: Orthopedics;  Laterality: Right;  . KYPHOPLASTY N/A  07/05/2016   Procedure: KYPHOPLASTY T - 9;  Surgeon: Hessie Knows, MD;  Location: [facility] ORS;  Service: Orthopedics;  Laterality: N/A;  . KYPHOPLASTY N/A 11/29/2017   Procedure: Iona Hansen;  Surgeon: Hessie Knows, MD;  Location: [facility] ORS;  Service: Orthopedics;  Laterality: N/A;  L2 and L3  . KYPHOPLASTY N/A 12/18/2017   Procedure: KYPHOPLASTY L1;  Surgeon: Hessie Knows, MD;  Location: [facility] ORS;  Service: Orthopedics;  Laterality: N/A;  . KYPHOPLASTY N/A 01/05/2018   Procedure: KYPHOPLASTY-T11,T12;  Surgeon: Hessie Knows, MD;  Location: [facility] ORS;  Service: Orthopedics;  Laterality: N/A;  . KYPHOPLASTY N/A 04/05/2018   Procedure: T10 KYPHOPLASTY;  Surgeon: Hessie Knows, MD;  Location: [facility] ORS;  Service: Orthopedics;  Laterality: N/A;  . KYPHOPLASTY N/A 04/12/2018   Procedure: KYPHOPLASTY T7,8;  Surgeon: Hessie Knows, MD;  Location: [facility] ORS;  Service: Orthopedics;  Laterality: N/A;  . KYPHOPLASTY N/A 04/19/2018   Procedure: KYPHOPLASTY T5, T6;  Surgeon: Hessie Knows, MD;  Location: [facility] ORS;  Service: Orthopedics;  Laterality: N/A;  . LUNG SURGERY  1990 and 1996  . THOROCOTOMY WITH LOBECTOMY     LEFT LOWER THORACOTOMY / RIGHT MIDDLE LOBECTOMY     FAMILY HISTORY   Family History  Problem Relation Age of Onset  . Hypertension Mother   . Hypertension Father      SOCIAL HISTORY   Social History   Tobacco Use  . Smoking status: Never Smoker  .  Smokeless tobacco: Never Used  Vaping Use  . Vaping Use: Never used  Substance Use Topics  . Alcohol use: No  . Drug use: No     MEDICATIONS    Home Medication:  Current Outpatient Rx  . Order #: [id number] Class: Historical Med  . Order #: [id number] Class: Historical Med  . Order #: [id number] Class: Historical Med  . Order #: [id number] Class: Historical Med  . Order #: [id number] Class: Historical Med  . Order #: [id number] Class: Historical Med  . Order #: [id number] Class: Historical Med  . Order #: [id number] Class: Historical Med   . Order #: [id number] Class: Historical Med  . Order #: [id number] Class: Historical Med  . Order #: [id number] Class: Historical Med  . Order #: [id number] Class: Historical Med  . Order #: [id number] Class: Historical Med  . Order #: [id number] Class: Historical Med  . Order #: [id number] Class: No Print  . Order #: [id number] Class: Historical Med  . Order #: [id number] Class: No Print  . Order #: [id number] Class: Historical Med  . Order #: [id number] Class: Historical Med  . Order #: [id number] Class: Historical Med  . Order #: [id number] Class: Historical Med  . Order #: [id number] Class: Historical Med  . Order #: [id number] Class: Historical Med    Current Medication:  Current Facility-Administered Medications:  .  0.9 %  sodium chloride infusion, , Intravenous, Continuous, Wieting, Richard, MD, Last Rate: 100 mL/hr at 04/29/20 0630, New Bag at 04/29/20 0630 .  acetaminophen (TYLENOL) tablet 650 mg, 650 mg, Oral, Q6H PRN **OR** acetaminophen (TYLENOL) suppository 650 mg, 650 mg, Rectal, Q6H PRN, Mansy, Jan A, MD .  acetylcysteine (MUCOMYST) 20 % nebulizer / oral solution 4 mL, 4 mL, Nebulization, Daily, Wieting, Richard, MD, 4 mL at 04/29/20 1227 .  acidophilus (RISAQUAD) capsule 1 capsule, 1 capsule, Oral, Daily, Leslye Peer, Richard, MD, 1 capsule at 04/29/20 1223 .  azithromycin (ZITHROMAX) 500 mg in sodium chloride 0.9 % 250 mL IVPB, 500 mg, Intravenous, Q24H, Mansy, Arvella Merles, MD, Stopped at 04/29/20 [phone] .  benzonatate (TESSALON) capsule 200 mg, 200 mg, Oral, TID PRN, Leslye Peer, Richard, MD .  cefTAZidime (FORTAZ) 2 g in sodium chloride 0.9 % 100 mL IVPB, 2 g, Intravenous, Q12H, Wieting, Richard, MD .  dextromethorphan-guaiFENesin (Dazey DM) 30-600 MG per 12 hr tablet 1 tablet, 1 tablet, Oral, BID PRN, Mansy, Jan A, MD .  diltiazem (CARDIZEM CD) 24 hr capsule 120 mg, 120 mg, Oral, Daily, Wieting, Richard, MD, 120 mg at 04/29/20 1222 .  furosemide (LASIX) tablet 20 mg, 20 mg, Oral, Daily PRN, Wieting, Richard,  MD .  gabapentin (NEURONTIN) capsule 300 mg, 300 mg, Oral, BID PRN, Leslye Peer, Richard, MD .  glycopyrrolate (ROBINUL) tablet 1 mg, 1 mg, Oral, TID PRN, Loletha Grayer, MD .  guaiFENesin (MUCINEX) 12 hr tablet 600 mg, 600 mg, Oral, BID, Mansy, Jan A, MD, 600 mg at 04/29/20 1222 .  ipratropium-albuterol (DUONEB) 0.5-2.5 (3) MG/3ML nebulizer solution 3 mL, 3 mL, Nebulization, QID, Mansy, Jan A, MD, 3 mL at 04/29/20 1222 .  LORazepam (ATIVAN) tablet 0.5 mg, 0.5 mg, Oral, QHS, Wieting, Richard, MD .  magnesium hydroxide (MILK OF MAGNESIA) suspension 30 mL, 30 mL, Oral, Daily PRN, Mansy, Jan A, MD .  metoprolol succinate (TOPROL-XL) 24 hr tablet 100 mg, 100 mg, Oral, Daily, Wieting, Richard, MD .  ondansetron (ZOFRAN) tablet 4 mg, 4 mg, Oral, Q6H PRN **OR** ondansetron (ZOFRAN) injection 4 mg, 4 mg, Intravenous, Q6H PRN, Mansy, Jan A, MD .  pantoprazole (PROTONIX) EC tablet 40 mg, 40 mg, Oral, Daily, Loletha Grayer, MD, 40  mg at 04/29/20 1223 .  tobramycin (PF) (TOBI) nebulizer solution 300 mg, 300 mg, Nebulization, BID, Leslye Peer, Richard, MD, 300 mg at 04/29/20 1222 .  traZODone (DESYREL) tablet 25 mg, 25 mg, Oral, QHS PRN, Mansy, Arvella Merles, MD  Current Outpatient Medications:  .  acetaminophen (TYLENOL) 500 MG tablet, Take 1,000 mg by mouth every 6 (six) hours as needed for mild pain or headache. , Disp: , Rfl:  .  acetylcysteine (MUCOMYST) 20 % nebulizer solution, Take 4 mLs by nebulization daily., Disp: , Rfl:  .  amLODipine (NORVASC) 5 MG tablet, Take 5 mg by mouth daily. , Disp: , Rfl:  .  apixaban (ELIQUIS) 5 MG TABS tablet, Take 5 mg by mouth 2 (two) times daily. , Disp: , Rfl:  .  Ascorbic Acid (VITAMIN C) 1000 MG tablet, Take 1,000 mg by mouth daily., Disp: , Rfl:  .  azithromycin (ZITHROMAX) 250 MG tablet, Take 250 mg by mouth daily., Disp: , Rfl:  .  benzonatate (TESSALON) 200 MG capsule, Take 200 mg by mouth 3 (three) times daily as needed., Disp: , Rfl:  .  diltiazem (CARDIZEM CD) 120 MG  24 hr capsule, Take 120 mg by mouth daily., Disp: , Rfl:  .  ferrous sulfate 325 (65 FE) MG EC tablet, Take 325 mg by mouth daily. , Disp: , Rfl:  .  furosemide (LASIX) 20 MG tablet, Take 20 mg by mouth daily as needed for edema., Disp: , Rfl:  .  gabapentin (NEURONTIN) 300 MG capsule, Take 300 mg by mouth 4 (four) times daily. , Disp: , Rfl:  .  glycopyrrolate (ROBINUL) 1 MG tablet, Take 1 tablet by mouth 3 (three) times daily., Disp: , Rfl:  .  levalbuterol (XOPENEX) 0.31 MG/3ML nebulizer solution, Inhale 3 mLs into the lungs every 6 (six) hours as needed., Disp: , Rfl:  .  levofloxacin (LEVAQUIN) 500 MG tablet, Take 1 tablet by mouth daily. For 7 days, Disp: , Rfl:  .  LORazepam (ATIVAN) 0.5 MG tablet, Take 1 tablet (0.5 mg total) by mouth at bedtime., Disp: , Rfl:  .  losartan (COZAAR) 100 MG tablet, Take 100 mg by mouth daily., Disp: , Rfl:  .  metoprolol succinate (TOPROL-XL) 100 MG 24 hr tablet, Take 1 tablet (100 mg total) by mouth daily., Disp: , Rfl:  .  NAC 600 MG CAPS, Take 600 mg by mouth daily after breakfast. , Disp: , Rfl:  .  omeprazole (PRILOSEC) 20 MG capsule, Take 20 mg by mouth daily., Disp: , Rfl:  .  potassium chloride (K-DUR) 10 MEQ tablet, Take 10 mEq by mouth daily., Disp: , Rfl:  .  predniSONE (DELTASONE) 20 MG tablet, Take 20 mg by mouth daily., Disp: , Rfl:  .  Probiotic Product (ALIGN) 4 MG CAPS, Take 4 mg by mouth daily., Disp: , Rfl:  .  tobramycin, PF, (TOBI) 300 MG/5ML nebulizer solution, Take 5 mLs by nebulization 2 (two) times daily., Disp: , Rfl:     ALLERGIES   Codeine, Sulfa antibiotics, and Penicillins     REVIEW OF SYSTEMS    Review of Systems:  Gen:  Denies  fever, sweats, chills weigh loss  HEENT: Denies blurred vision, double vision, ear pain, eye pain, hearing loss, nose bleeds, sore throat Cardiac:  No dizziness, chest pain or heaviness, chest tightness,edema Resp:   Denies cough or sputum porduction, shortness of breath,wheezing,  hemoptysis,  Gi: Denies swallowing difficulty, stomach pain, nausea or vomiting, diarrhea, constipation, bowel incontinence Gu:  Denies bladder incontinence, burning urine Ext:   Denies Joint pain, stiffness or swelling Skin: Denies  skin rash, easy bruising or bleeding or hives Endoc:  Denies polyuria, polydipsia , polyphagia or weight change Psych:   Denies depression, insomnia or hallucinations   Other:  All other systems negative   VS: BP 123/70   Pulse 70   Temp 98 F (36.7 C) (Oral)   Resp 16   Ht '5\' 5"'$  (1.651 m)   Wt 50.3 kg   SpO2 99%   BMI 18.47 kg/m      PHYSICAL EXAM    GENERAL:NAD, no fevers, chills, no weakness no fatigue HEAD: Normocephalic, atraumatic.  EYES: Pupils equal, round, reactive to light. Extraocular muscles intact. No scleral icterus.  MOUTH: Moist mucosal membrane. Dentition intact. No abscess noted.  EAR, NOSE, THROAT: Clear without exudates. No external lesions.  NECK: Supple. No thyromegaly. No nodules. No JVD.  PULMONARY: rhonchi bilaterally  CARDIOVASCULAR: S1 and S2. Regular rate and rhythm. No murmurs, rubs, or gallops. No edema. Pedal pulses 2+ bilaterally.  GASTROINTESTINAL: Soft, nontender, nondistended. No masses. Positive bowel sounds. No hepatosplenomegaly.  MUSCULOSKELETAL: No swelling, clubbing, or edema. Range of motion full in all extremities.  NEUROLOGIC: Cranial nerves II through XII are intact. No gross focal neurological deficits. Sensation intact. Reflexes intact.  SKIN: No ulceration, lesions, rashes, or cyanosis. Skin warm and dry. Turgor intact.  PSYCHIATRIC: Mood, affect within normal limits. The patient is awake, alert and oriented x 3. Insight, judgment intact.       IMAGING    CT Chest W Contrast  Result Date: 04/29/2020 CLINICAL DATA:  Hemoptysis EXAM: CT CHEST WITH CONTRAST TECHNIQUE: Multidetector CT imaging of the chest was performed during intravenous contrast administration. CONTRAST:  75m OMNIPAQUE  IOHEXOL 300 MG/ML  SOLN COMPARISON:  09/28/2018 FINDINGS: Cardiovascular: Extensive multi-vessel coronary artery calcification. Mild cardiomegaly, unchanged. No pericardial effusion. The central pulmonary arteries are enlarged in keeping with changes of pulmonary arterial hypertension. Mild atherosclerotic calcification is seen within the thoracic aorta. No aortic aneurysm. Mediastinum/Nodes: Soft tissue within the right paratracheal region represents the unopacified azygos vein. No pathologic thoracic adenopathy. Thyroid unremarkable. Large hiatal hernia of this present containing the hepatic flexure of the colon, and the majority of the stomach., similar to prior examination. Lungs/Pleura: There is multifocal bronchiectasis noted, most severe within the right upper lobe and lung bases bilaterally. There is marked architectural distortion of the left lower lobe. Bronchiectatic changes appear stable when compared to prior examination. No superimposed focal pulmonary infiltrates. Resection of the lateral segment of the right upper lobe, right middle lobe, and inferior lingula have been performed. No suspicious focal pulmonary nodules or masses. No pneumothorax or pleural effusion. The central airways are patent. Upper Abdomen: Cholecystectomy has been performed. Moderate intra and marked extrahepatic biliary ductal dilation appears stable, not fully assessed on this examination. Musculoskeletal: Multilevel thoracolumbar vertebroplasty has been performed. Remote appearing T3 compression fracture noted with approximately 50% loss of height. No acute bone abnormality. IMPRESSION: Multifocal bronchiectasis, similar to prior examination. No superimposed focal pulmonary infiltrate or mass identified. No central obstructing lesion. Extensive multi-vessel coronary artery calcification. Morphologic changes in keeping with pulmonary arterial hypertension. Large hiatal hernia, similar to prior examination. Aortic  Atherosclerosis (ICD10-I70.0). Electronically Signed   By: AFidela SalisburyMD   On: 04/29/2020 05:12   DG Chest Portable 1 View  Result Date: 04/29/2020 CLINICAL DATA:  Shortness of breath and hemoptysis EXAM: PORTABLE CHEST 1 VIEW COMPARISON:  09/08/2019 FINDINGS: Cardiac  shadow is stable. Aortic calcifications are again seen. Multilevel vertebral augmentation is noted. Left retrocardiac consolidation is noted stable from the prior exam. Chronic fibrotic changes are noted throughout both lungs stable from the previous exam. Postsurgical changes are noted. No acute bony abnormality is seen. IMPRESSION: Chronic changes without acute abnormality Electronically Signed   By: Inez Catalina M.D.   On: 04/29/2020 01:09      ASSESSMENT/PLAN   Acute on chronic hypoxemic respiratory failure - due to acute exacerbation of bronchiectasis -will d/c ceftaz/zithromax/tobi neb - instead amikacin q12h adjusted to ideal body weight with pharmacy for levels/dosing -chest PT -Mucomyst 20% 57m BID -continue Rubinol '1mg'$  tid  - PT/OT -fungal cultures -fungitell serum -AFB expectorated sputum  - aerobic/anaerobic cultures -sputum   Non-massive hemoptysis  - improved   - related to chronic bronchiectasis with exacerbation   - will investigate infectious etiology   - place on amikacin for now     Thank you for allowing me to participate in the care of this patient.   Patient/Family are satisfied with care plan and all questions have been answered.  This document was prepared using Dragon voice recognition software and may include unintentional dictation errors.     FOttie Glazier M.D.  Division of [facility]

## 2020-04-29 NOTE — ED Notes (Signed)
Pt to CT dept via stretcher; remains awake and alert- no acute changes.

## 2020-04-30 ENCOUNTER — Inpatient Hospital Stay
Admit: 2020-04-30 | Discharge: 2020-04-30 | Disposition: A | Discharge status: Home / Self Care | Payer: Medicare HMO | Attending: Internal Medicine | Admitting: Internal Medicine

## 2020-04-30 DIAGNOSIS — L89611 Pressure ulcer of right heel, stage 1: Secondary | ICD-10-CM

## 2020-04-30 DIAGNOSIS — K5900 Constipation, unspecified: Secondary | ICD-10-CM

## 2020-04-30 DIAGNOSIS — F419 Anxiety disorder, unspecified: Secondary | ICD-10-CM

## 2020-04-30 LAB — ECHOCARDIOGRAM COMPLETE
AR max vel: 1.52 cm2
AV Area VTI: 1.64 cm2
AV Area mean vel: 1.45 cm2
AV Mean grad: 4 mmHg
AV Peak grad: 7.7 mmHg
Ao pk vel: 1.39 m/s
Area-P 1/2: 5.54 cm2
Height: 65 in
S' Lateral: 2.42 cm
Weight: 1920 oz

## 2020-04-30 LAB — CBC
HCT: 34.6 % — ABNORMAL LOW (ref 36.0–46.0)
Hemoglobin: 11.4 g/dL — ABNORMAL LOW (ref 12.0–15.0)
MCH: 33 pg (ref 26.0–34.0)
MCHC: 32.9 g/dL (ref 30.0–36.0)
MCV: 100.3 fL — ABNORMAL HIGH (ref 80.0–100.0)
Platelets: 247 10*3/uL (ref 150–400)
RBC: 3.45 MIL/uL — ABNORMAL LOW (ref 3.87–5.11)
RDW: 11.9 % (ref 11.5–15.5)
WBC: 12.5 10*3/uL — ABNORMAL HIGH (ref 4.0–10.5)
nRBC: 0 % (ref 0.0–0.2)

## 2020-04-30 LAB — BASIC METABOLIC PANEL
Anion gap: 6 (ref 5–15)
BUN: 18 mg/dL (ref 8–23)
CO2: 32 mmol/L (ref 22–32)
Calcium: 8.2 mg/dL — ABNORMAL LOW (ref 8.9–10.3)
Chloride: 104 mmol/L (ref 98–111)
Creatinine, Ser: 0.75 mg/dL (ref 0.44–1.00)
GFR, Estimated: >60 mL/min (ref >60)
Glucose, Bld: 136 mg/dL — ABNORMAL HIGH (ref 70–99)
Potassium: 4.2 mmol/L (ref 3.5–5.1)
Sodium: 142 mmol/L (ref 135–145)

## 2020-04-30 LAB — URINE CULTURE: Culture: <10000 — AB

## 2020-04-30 LAB — HEMOGLOBIN AND HEMATOCRIT, BLOOD
HCT: 35.4 % — ABNORMAL LOW (ref 36.0–46.0)
Hemoglobin: 11.5 g/dL — ABNORMAL LOW (ref 12.0–15.0)

## 2020-04-30 MED ORDER — METHYLPREDNISOLONE SODIUM SUCC 40 MG IJ SOLR: 40.0000 mg | Freq: Every day | INTRAMUSCULAR | Status: DC

## 2020-04-30 MED ORDER — DEXTROSE 5 % IV SOLN
800.0000 mg | INTRAVENOUS | Status: DC
  Administered 2020-05-01: 800 mg via INTRAVENOUS
  Filled 2020-04-30 (×2): qty 3.2

## 2020-04-30 MED ORDER — SODIUM CHLORIDE 0.9 % IV SOLN
2.0000 g | INTRAVENOUS | Status: DC
  Administered 2020-04-30 – 2020-05-01 (×2): 2 g via INTRAVENOUS
  Filled 2020-04-30 (×2): qty 20
  Filled 2020-04-30: qty 2

## 2020-04-30 MED ORDER — METHYLPREDNISOLONE SODIUM SUCC 40 MG IJ SOLR
20.0000 mg | Freq: Every day | INTRAMUSCULAR | Status: DC
  Administered 2020-04-30 – 2020-05-02 (×3): 20 mg via INTRAVENOUS
  Filled 2020-04-30 (×3): qty 1

## 2020-04-30 MED ORDER — SENNA 8.6 MG PO TABS
2.0000 | ORAL_TABLET | Freq: Every day | ORAL | Status: DC
  Administered 2020-04-30 – 2020-05-01 (×2): 17.2 mg via ORAL
  Filled 2020-04-30 (×2): qty 2

## 2020-04-30 MED ORDER — IPRATROPIUM-ALBUTEROL 0.5-2.5 (3) MG/3ML IN SOLN
3.0000 mL | Freq: Three times a day (TID) | RESPIRATORY_TRACT | Status: DC
  Administered 2020-04-30 – 2020-05-02 (×6): 3 mL via RESPIRATORY_TRACT
  Filled 2020-04-30 (×6): qty 3

## 2020-04-30 MED ORDER — POLYETHYLENE GLYCOL 3350 17 G PO PACK
17.0000 g | PACK | Freq: Every day | ORAL | Status: DC
  Administered 2020-04-30 – 2020-05-02 (×3): 17 g via ORAL
  Filled 2020-04-30 (×3): qty 1

## 2020-04-30 NOTE — Progress Notes (Signed)
*  PRELIMINARY RESULTS* Echocardiogram 2D Echocardiogram has been performed.  Ana Washington 04/30/2020, 10:04 AM

## 2020-04-30 NOTE — Progress Notes (Signed)
Pharmacy Antibiotic Note  Ana Washington is a 75 y.o. female admitted on 04/29/2020. Pharmacy originally consulted for ceftazidime dosing. Antibiotics changed to amikacin per Pulmonology.  Plan: Amikacin 800 mg IV x 1 on 3/30. Plan to order level 3/31 AM. Lab is a send out lab, will take 2-3 days to result. Plan to assess pt's renal function and dose accordingly.    Height: '5\' 5"'$  (165.1 cm) Weight: 54.4 kg (120 lb) IBW/kg (Calculated) : 57  Temp (24hrs), Avg:98 F (36.7 C), Min:97.5 F (36.4 C), Max:98.3 F (36.8 C)  Recent Labs  Lab 04/29/20 0108 04/29/20 0235 04/30/20 0458  WBC 21.6*  --  12.5*  CREATININE 1.15*  --  0.75  LATICACIDVEN  --  1.1  --     Estimated Creatinine Clearance: 52.2 mL/min (by C-G formula based on SCr of 0.75 mg/dL).    Allergies  Allergen Reactions  . Codeine Nausea And Vomiting  . Sulfa Antibiotics Diarrhea  . Penicillins Rash    Has patient had a PCN reaction causing immediate rash, facial/tongue/throat swelling, SOB or lightheadedness with hypotension: Unknown Has patient had a PCN reaction causing severe rash involving mucus membranes or skin necrosis: Unknown Has patient had a PCN reaction that required hospitalization: Unknown Has patient had a PCN reaction occurring within the last 10 years: No If all of the above answers are "NO", then may proceed with Cephalosporin use.     Antimicrobials this admission: Ceftriaxone 3/30 x 1 Azithromycin 3/30 x 1 Amikacin 3/30 >>   Microbiology results: 3/30 BCx: NGTD 3/30 UCx: pending  3/30 Sputum Cx: NO WBC SEEN FEW GRAM POSITIVE COCCI FEW GRAM POSITIVE RODS 3/30 fungus without smear Expectorated Sputum Cx: pending.    Thank you for allowing pharmacy to be a part of this patient's care.  Oswald Hillock, PharmD 04/30/2020 8:19 AM

## 2020-04-30 NOTE — Progress Notes (Signed)
Pulmonary Medicine          Date: 04/30/2020,   MRN# [id number] Ana Washington April 03, 1945     AdmissionWeight: 50.3 kg                 CurrentWeight: 54.4 kg    Referring physician: Dr Sidney Ace  CHIEF COMPLAINT:   Acute on chronic hypoxemic respiratory failure   HISTORY OF PRESENT ILLNESS   Very pleasant 75 yo well known to our service with recurrent respiratory infections and chronic brnochitis from advanced sacular cavitary bronchiectasis with MAI and recurrent overgrowth of flora and episodic non-massive hemoptysis. She was seen by me in office with hypotension and severe cough.  She mildly improved but not good enough with continued blood streaked expectorate.   04/30/20- patient is improving, she has less cough today.  She had 1 dose of amikacin thus far. She responded well to this, renal function is stable. He phelgm is less this morning. She is using metaneb with Respiratory therapist. She received DuoNeb I have discussed with her this may cause worsening AFrVR she will minimize the frequency.    PAST MEDICAL HISTORY   Past Medical History:  Diagnosis Date  . Arthritis   . Atrial fibrillation ([place])   . Bronchiectasis ([place])   . CAD (coronary artery disease) 07/30/2017  . Dumping syndrome   . 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 Family history of adverse reaction to anesthesia    sister [name]  . GERD (gastroesophageal reflux disease)   . Headache    MIGRAINES  . Myocardial infarction ([place]) 2007   Non-STEMI  . [name] (postoperative nausea and vomiting)   . Psoriasis   . PUD (peptic ulcer disease)      SURGICAL HISTORY   Past Surgical History:  Procedure Laterality Date  . BACK SURGERY    . CHOLECYSTECTOMY    . ESOPHAGOGASTRODUODENOSCOPY (EGD) WITH PROPOFOL N/A 03/19/2019   Procedure: ESOPHAGOGASTRODUODENOSCOPY (EGD) WITH PROPOFOL;  Surgeon: [name], MD;  Location: [facility]COPY;  Service: Gastroenterology;  Laterality: N/A;  *Note to  anesthesia: Per pt's pulmonologist, if intubating, please extubate to BIPAP.  [name] EYE SURGERY    . FOOT SURGERY    . INTRAMEDULLARY (IM) NAIL INTERTROCHANTERIC Right 09/30/2018   Procedure: INTRAMEDULLARY (IM) NAIL INTERTROCHANTRIC;  Surgeon: Dereck Leep, MD;  Location: [facility] ORS;  Service: Orthopedics;  Laterality: Right;  . KYPHOPLASTY N/A 07/05/2016   Procedure: KYPHOPLASTY T - 9;  Surgeon: Hessie Knows, MD;  Location: [facility] ORS;  Service: Orthopedics;  Laterality: N/A;  . KYPHOPLASTY N/A 11/29/2017   Procedure: Iona Hansen;  Surgeon: Hessie Knows, MD;  Location: [facility] ORS;  Service: Orthopedics;  Laterality: N/A;  L2 and L3  . KYPHOPLASTY N/A 12/18/2017   Procedure: KYPHOPLASTY L1;  Surgeon: Hessie Knows, MD;  Location: [facility] ORS;  Service: Orthopedics;  Laterality: N/A;  . KYPHOPLASTY N/A 01/05/2018   Procedure: KYPHOPLASTY-T11,T12;  Surgeon: Hessie Knows, MD;  Location: [facility] ORS;  Service: Orthopedics;  Laterality: N/A;  . KYPHOPLASTY N/A 04/05/2018   Procedure: T10 KYPHOPLASTY;  Surgeon: Hessie Knows, MD;  Location: [facility] ORS;  Service: Orthopedics;  Laterality: N/A;  . KYPHOPLASTY N/A 04/12/2018   Procedure: KYPHOPLASTY T7,8;  Surgeon: Hessie Knows, MD;  Location: [facility] OR[facility];  Service: Orthopedics;  Laterality: N/A;  . KYPHOPLASTY N/A 04/19/2018   Procedure: KYPHOPLASTY T5, T6;  Surgeon: Hessie Knows, MD;  Location: [facility] ORS;  Service: Orthopedics;  Laterality: N/A;  . LUNG SURGERY  1990 and 1996  . THOROCOTOMY  WITH LOBECTOMY     LEFT LOWER THORACOTOMY / RIGHT MIDDLE LOBECTOMY     FAMILY HISTORY   Family History  Problem Relation Age of Onset  . Hypertension Mother   . Hypertension Father      SOCIAL HISTORY   Social History   Tobacco Use  . Smoking status: Never Smoker  . Smokeless tobacco: Never Used  Vaping Use  . Vaping Use: Never used  Substance Use Topics  . Alcohol use: No  . Drug use: No     MEDICATIONS    Home Medication:    Current  Medication:  Current Facility-Administered Medications:  .  0.9 %  sodium chloride infusion, , Intravenous, Continuous, Wieting, Richard, MD, Last Rate: 50 mL/hr at 04/30/20 0925, New Bag at 04/30/20 0925 .  acetaminophen (TYLENOL) tablet 650 mg, 650 mg, Oral, Q6H PRN, 650 mg at 04/30/20 0924 **OR** acetaminophen (TYLENOL) suppository 650 mg, 650 mg, Rectal, Q6H PRN, Mansy, Jan A, MD .  acetylcysteine (MUCOMYST) 20 % nebulizer / oral solution 4 mL, 4 mL, Nebulization, Daily, Leslye Peer, Richard, MD, 4 mL at 04/30/20 0746 .  acidophilus (RISAQUAD) capsule 1 capsule, 1 capsule, Oral, Daily, Leslye Peer, Richard, MD, 1 capsule at 04/30/20 0917 .  benzonatate (TESSALON) capsule 200 mg, 200 mg, Oral, TID PRN, Leslye Peer, Richard, MD .  dextromethorphan-guaiFENesin [facility] DM) 30-600 MG per 12 hr tablet 1 tablet, 1 tablet, Oral, BID PRN, Mansy, Jan A, MD .  diltiazem (CARDIZEM CD) 24 hr capsule 120 mg, 120 mg, Oral, Daily, Wieting, Richard, MD, 120 mg at 04/30/20 [id number] .  furosemide (LASIX) tablet 20 mg, 20 mg, Oral, Daily PRN, Wieting, Richard, MD .  gabapentin (NEURONTIN) capsule 300 mg, 300 mg, Oral, BID PRN, Loletha Grayer, MD, 300 mg at 04/29/20 2113 .  glycopyrrolate (ROBINUL) tablet 1 mg, 1 mg, Oral, TID PRN, Loletha Grayer, MD .  guaiFENesin [facility]) 12 hr tablet 600 mg, 600 mg, Oral, BID, Mansy, Jan A, MD, 600 mg at 04/30/20 0917 .  ipratropium-albuterol (DUONEB) 0.5-2.5 (3) MG/3ML nebulizer solution 3 mL, 3 mL, Nebulization, TID, Wieting, Richard, MD .  LORazepam (ATIVAN) tablet 0.5 mg, 0.5 mg, Oral, QHS, Wieting, Richard, MD, 0.5 mg at 04/29/20 2113 .  magnesium hydroxide (MILK OF MAGNESIA) suspension 30 mL, 30 mL, Oral, Daily PRN, Mansy, Jan A, MD .  methylPREDNISolone sodium succinate (SOLU-MEDROL) 40 mg/mL injection 40 mg, 40 mg, Intravenous, Daily, Wieting, Richard, MD .  metoprolol succinate (TOPROL-XL) 24 hr tablet 100 mg, 100 mg, Oral, Daily, Leslye Peer, Richard, MD, 100 mg at 04/30/20 0918 .   ondansetron (ZOFRAN) tablet 4 mg, 4 mg, Oral, Q6H PRN **OR** ondansetron (ZOFRAN) injection 4 mg, 4 mg, Intravenous, Q6H PRN, Mansy, Jan A, MD .  pantoprazole (PROTONIX) EC tablet 40 mg, 40 mg, Oral, Daily, Leslye Peer, Richard, MD, 40 mg at 04/30/20 0917 .  polyethylene glycol (MIRALAX / GLYCOLAX) packet 17 g, 17 g, Oral, Daily, Wieting, Richard, MD .  senna (SENOKOT) tablet 17.2 mg, 2 tablet, Oral, QHS, Wieting, Richard, MD .  traZODone (DESYREL) tablet 25 mg, 25 mg, Oral, QHS PRN, Mansy, Jan A, MD    ALLERGIES   Codeine, Sulfa antibiotics, and Penicillins     REVIEW OF SYSTEMS    Review of Systems:  Gen:  Denies  fever, sweats, chills weigh loss  HEENT: Denies blurred vision, double vision, ear pain, eye pain, hearing loss, nose bleeds, sore throat Cardiac:  No dizziness, chest pain or heaviness, chest tightness,edema Resp:   Denies cough or sputum  porduction, shortness of breath,wheezing, hemoptysis,  Gi: Denies swallowing difficulty, stomach pain, nausea or vomiting, diarrhea, constipation, bowel incontinence Gu:  Denies bladder incontinence, burning urine Ext:   Denies Joint pain, stiffness or swelling Skin: Denies  skin rash, easy bruising or bleeding or hives Endoc:  Denies polyuria, polydipsia , polyphagia or weight change Psych:   Denies depression, insomnia or hallucinations   Other:  All other systems negative   VS: BP 130/76 (BP Location: Right Arm)   Pulse 90   Temp 98 F (36.7 C) (Oral)   Resp 16   Ht '5\' 5"'$  (1.651 m)   Wt 54.4 kg   SpO2 100%   BMI 19.97 kg/m      PHYSICAL EXAM    GENERAL:NAD, no fevers, chills, no weakness no fatigue HEAD: Normocephalic, atraumatic.  EYES: Pupils equal, round, reactive to light. Extraocular muscles intact. No scleral icterus.  MOUTH: Moist mucosal membrane. Dentition intact. No abscess noted.  EAR, NOSE, THROAT: Clear without exudates. No external lesions.  NECK: Supple. No thyromegaly. No nodules. No JVD.   PULMONARY: rhonchi bilaterally  CARDIOVASCULAR: S1 and S2. Regular rate and rhythm. No murmurs, rubs, or gallops. No edema. Pedal pulses 2+ bilaterally.  GASTROINTESTINAL: Soft, nontender, nondistended. No masses. Positive bowel sounds. No hepatosplenomegaly.  MUSCULOSKELETAL: No swelling, clubbing, or edema. Range of motion full in all extremities.  NEUROLOGIC: Cranial nerves II through XII are intact. No gross focal neurological deficits. Sensation intact. Reflexes intact.  SKIN: No ulceration, lesions, rashes, or cyanosis. Skin warm and dry. Turgor intact.  PSYCHIATRIC: Mood, affect within normal limits. The patient is awake, alert and oriented x 3. Insight, judgment intact.       IMAGING    CT Chest W Contrast  Result Date: 04/29/2020 CLINICAL DATA:  Hemoptysis EXAM: CT CHEST WITH CONTRAST TECHNIQUE: Multidetector CT imaging of the chest was performed during intravenous contrast administration. CONTRAST:  31m OMNIPAQUE IOHEXOL 300 MG/ML  SOLN COMPARISON:  09/28/2018 FINDINGS: Cardiovascular: Extensive multi-vessel coronary artery calcification. Mild cardiomegaly, unchanged. No pericardial effusion. The central pulmonary arteries are enlarged in keeping with changes of pulmonary arterial hypertension. Mild atherosclerotic calcification is seen within the thoracic aorta. No aortic aneurysm. Mediastinum/Nodes: Soft tissue within the right paratracheal region represents the unopacified azygos vein. No pathologic thoracic adenopathy. Thyroid unremarkable. Large hiatal hernia of this present containing the hepatic flexure of the colon, and the majority of the stomach., similar to prior examination. Lungs/Pleura: There is multifocal bronchiectasis noted, most severe within the right upper lobe and lung bases bilaterally. There is marked architectural distortion of the left lower lobe. Bronchiectatic changes appear stable when compared to prior examination. No superimposed focal pulmonary infiltrates.  Resection of the lateral segment of the right upper lobe, right middle lobe, and inferior lingula have been performed. No suspicious focal pulmonary nodules or masses. No pneumothorax or pleural effusion. The central airways are patent. Upper Abdomen: Cholecystectomy has been performed. Moderate intra and marked extrahepatic biliary ductal dilation appears stable, not fully assessed on this examination. Musculoskeletal: Multilevel thoracolumbar vertebroplasty has been performed. Remote appearing T3 compression fracture noted with approximately 50% loss of height. No acute bone abnormality. IMPRESSION: Multifocal bronchiectasis, similar to prior examination. No superimposed focal pulmonary infiltrate or mass identified. No central obstructing lesion. Extensive multi-vessel coronary artery calcification. Morphologic changes in keeping with pulmonary arterial hypertension. Large hiatal hernia, similar to prior examination. Aortic Atherosclerosis (ICD10-I70.0). Electronically Signed   By: AFidela SalisburyMD   On: 04/29/2020 05:12   DG Chest  Portable 1 View  Result Date: 04/29/2020 CLINICAL DATA:  Shortness of breath and hemoptysis EXAM: PORTABLE CHEST 1 VIEW COMPARISON:  09/08/2019 FINDINGS: Cardiac shadow is stable. Aortic calcifications are again seen. Multilevel vertebral augmentation is noted. Left retrocardiac consolidation is noted stable from the prior exam. Chronic fibrotic changes are noted throughout both lungs stable from the previous exam. Postsurgical changes are noted. No acute bony abnormality is seen. IMPRESSION: Chronic changes without acute abnormality Electronically Signed   By: Inez Catalina M.D.   On: 04/29/2020 01:09      ASSESSMENT/PLAN   Acute on chronic hypoxemic respiratory failure - due to acute exacerbation of bronchiectasis -will d/c ceftaz/zithromax/tobi neb - instead amikacin q36 adjusted to ideal body weight with pharmacy for levels/dosing -chest PT -Mucomyst 20% 79m  BID -continue Rubinol '1mg'$  tid  - PT/OT -fungal cultures -fungitell serum -AFB expectorated sputum  - aerobic/anaerobic cultures -sputum -duoneb  -continue solumedrol decreased to 20 today  Non-massive hemoptysis  - improved   - related to chronic bronchiectasis with exacerbation   - infectious etiology   - continue amikacin for now appreciate pharmacy input      Thank you for allowing me to participate in the care of this patient.   Patient/Family are satisfied with care plan and all questions have been answered.  This document was prepared using Dragon voice recognition software and may include unintentional dictation errors.     FOttie Glazier M.D.  Division of [facility]

## 2020-04-30 NOTE — Progress Notes (Signed)
Pharmacy - Brief Note  See pharmacist's note re: Amikacin dosing from earlier today  Amikacin '800mg'$  x 1 given last evening at 18:54 and SCr checked this am, SCr improved from 1.15 to 0.75.  A random amikacin level was drawn this am at 08:17 (13.25hr from start of infusion)    Plan:  Random amikacin level pending but may take several days to result (appears to be send-out lab)  Start Amikacin '800mg'$  q36h for a peak = 60 mcg/ml and trough < 1 mcg/ml  Check SCr in am  Doreene Eland, PharmD, BCPS.   Work Cell: [phone] 04/30/2020 1:48 PM

## 2020-04-30 NOTE — Progress Notes (Signed)
Patient ID: Ana [place], female   DOB: [birth year], 75 y.o.   MRN: [id number] [facility] PROGRESS NOTE  CHANDANI ROGOWSKI WUJ:[id number] DOB: Mar 05, 1945 DOA: 04/29/2020 PCP: [name], MD  HPI/Subjective: Patient seen this morning and she stated she has not coughed up any blood today.  Did have a rough night with wheezing and coughing.  Not feeling great today.  Some shortness of breath.  Patient does complain of some right heel pain and constipation.  Patient admitted with bronchiectasis exacerbation.  Objective: Vitals:   04/30/20 0914 04/30/20 1418  BP: 130/76 137/80  Pulse: 90 79  Resp: 16 18  Temp: 98 F (36.7 C) 97.9 F (36.6 C)  SpO2: 100% 99%    Intake/Output Summary (Last 24 hours) at 04/30/2020 1541 Last data filed at 04/30/2020 1441 Gross per 24 hour  Intake 480 ml  Output 1500 ml  Net -1020 ml   Filed Weights   04/29/20 0059 04/30/20 0500  Weight: 50.3 kg 54.4 kg    ROS: Review of Systems  Respiratory: Positive for cough, shortness of breath and wheezing.   Cardiovascular: Negative for chest pain.  Gastrointestinal: Positive for constipation. Negative for abdominal pain, nausea and vomiting.  Musculoskeletal: Positive for joint pain.   Exam: Physical Exam HENT:     Head: Normocephalic.     Mouth/Throat:     Pharynx: No oropharyngeal exudate.  Eyes:     General: Lids are normal.     Conjunctiva/sclera: Conjunctivae normal.     Pupils: Pupils are equal, round, and reactive to light.  Cardiovascular:     Rate and Rhythm: Normal rate and regular rhythm.     Heart sounds: Normal heart sounds, S1 normal and S2 normal.  Pulmonary:     Breath sounds: Examination of the right-middle field reveals wheezing. Examination of the left-middle field reveals wheezing. Examination of the right-lower field reveals decreased breath sounds. Examination of the left-lower field reveals decreased breath sounds. Decreased breath sounds and wheezing present. No rhonchi  or rales.  Abdominal:     Palpations: Abdomen is soft.     Tenderness: There is no abdominal tenderness.  Musculoskeletal:     Right lower leg: No swelling.     Left lower leg: No swelling.     Comments: Some redness on the right heel.  Skin:    General: Skin is warm.     Findings: No rash.  Neurological:     Mental Status: She is alert and oriented to person, place, and time.       Data Reviewed: Basic Metabolic Panel: Recent Labs  Lab 04/29/20 0108 04/30/20 0458  NA 138 142  K 3.7 4.2  CL 93* 104  CO2 33* 32  GLUCOSE 115* 136*  BUN 29* 18  CREATININE 1.15* 0.75  CALCIUM 8.9 8.2*  MG 1.6*  --    Liver Function Tests: Recent Labs  Lab 04/29/20 0108  AST 26  ALT 13  ALKPHOS 41  BILITOT 0.7  PROT 6.6  ALBUMIN 3.8   Recent Labs  Lab 04/29/20 0108  LIPASE 42   CBC: Recent Labs  Lab 04/29/20 0108 04/29/20 0603 04/29/20 1422 04/29/20 1726 04/29/20 2338 04/30/20 0458  WBC 21.6*  --   --   --   --  12.5*  NEUTROABS 19.1*  --   --   --   --   --   HGB 12.7 12.1 12.0 12.1 11.5* 11.4*  HCT 38.0 35.5* 36.1 37.0 35.4* 34.6*  MCV  97.9  --   --   --   --  100.3*  PLT 338  --   --   --   --  247  BNP (last 3 results) Recent Labs    09/08/19 0650 04/29/20 0108  BNP 101.3* 71.0     Recent Results (from the past 240 hour(s))  Resp Panel by RT-PCR (Flu A&B, Covid) Nasopharyngeal Swab     Status: None   Collection Time: 04/29/20  1:08 AM   Specimen: Nasopharyngeal Swab; Nasopharyngeal(NP) swabs in vial transport medium  Result Value Ref Range Status   SARS Coronavirus 2 by RT PCR NEGATIVE NEGATIVE Final    Comment: (NOTE) SARS-CoV-2 target nucleic acids are NOT DETECTED.  The SARS-CoV-2 RNA is generally detectable in upper respiratory specimens during the acute phase of infection. The lowest concentration of SARS-CoV-2 viral copies this assay can detect is 138 copies/mL. A negative result does not preclude SARS-Cov-2 infection and should not be used  as the sole basis for treatment or other patient management decisions. A negative result may occur with  improper specimen collection/handling, submission of specimen other than nasopharyngeal swab, presence of viral mutation(s) within the areas targeted by this assay, and inadequate number of viral copies(<138 copies/mL). A negative result must be combined with clinical observations, patient history, and epidemiological information. The expected result is Negative.  Fact Sheet for Patients:  EntrepreneurPulse.com.au  Fact Sheet for Healthcare Providers:  IncredibleEmployment.be  This test is no t yet approved or cleared by the [place] FDA and  has been authorized for detection and/or diagnosis of SARS-CoV-2 by FDA under an Emergency Use Authorization (EUA). This EUA will remain  in effect (meaning this test can be used) for the duration of the COVID-19 declaration under Section 564(b)(1) of the Act, 21 U.S.C.section 360bbb-3(b)(1), unless the authorization is terminated  or revoked sooner.       Influenza A by PCR NEGATIVE NEGATIVE Final   Influenza B by PCR NEGATIVE NEGATIVE Final    Comment: (NOTE) The Xpert Xpress SARS-CoV-2/FLU/RSV plus assay is intended as an aid in the diagnosis of influenza from Nasopharyngeal swab specimens and should not be used as a sole basis for treatment. Nasal washings and aspirates are unacceptable for Xpert Xpress SARS-CoV-2/FLU/RSV testing.  Fact Sheet for Patients: EntrepreneurPulse.com.au  Fact Sheet for Healthcare Providers: IncredibleEmployment.be  This test is not yet approved or cleared by the [place] FDA and has been authorized for detection and/or diagnosis of SARS-CoV-2 by FDA under an Emergency Use Authorization (EUA). This EUA will remain in effect (meaning this test can be used) for the duration of the COVID-19 declaration under Section 564(b)(1)  of the Act, 21 U.S.C. section 360bbb-3(b)(1), unless the authorization is terminated or revoked.  Performed at [facility], [address] [place], [place] [address]   Culture, blood (Routine X 2) w Reflex to ID Panel     Status: None (Preliminary result)   Collection Time: 04/29/20  2:35 AM   Specimen: BLOOD  Result Value Ref Range Status   Specimen Description BLOOD RIGHT ANTECUBITAL  Final   Special Requests   Final    BOTTLES DRAWN AEROBIC AND ANAEROBIC Blood Culture adequate volume   Culture   Final    NO GROWTH 1 DAY Performed at [facility], [address], [place], [place] [address]    Report Status PENDING  Incomplete  Urine Culture     Status: Abnormal   Collection Time: 04/29/20  2:35 AM  Specimen: Urine, Random  Result Value Ref Range Status   Specimen Description   Final    URINE, RANDOM Performed at [facility], [address], [place], [place] [address]    Special Requests   Final    NONE Performed at [facility], [address] [place], [place] [address]    Culture (A)  Final    <10,000 COLONIES/mL INSIGNIFICANT GROWTH Performed at [facility] [address] [place], [place] [address]    Report Status 04/30/2020 FINAL  Final  Culture, blood (Routine X 2) w Reflex to ID Panel     Status: None (Preliminary result)   Collection Time: 04/29/20  3:08 AM   Specimen: BLOOD  Result Value Ref Range Status   Specimen Description BLOOD LEFT ANTECUBITAL  Final   Special Requests   Final    BOTTLES DRAWN AEROBIC AND ANAEROBIC Blood Culture adequate volume   Culture   Final    NO GROWTH 1 DAY Performed at [facility], [address], [place], [place] [address]    Report Status PENDING  Incomplete  Expectorated Sputum Assessment w Gram Stain, Rflx to Resp Cult     Status: None   Collection Time: 04/29/20  2:36 PM   Specimen: Sputum  Result Value Ref Range Status   Specimen Description SPUTUM  Final    Special Requests NONE  Final   Sputum evaluation   Final    THIS SPECIMEN IS ACCEPTABLE FOR SPUTUM CULTURE Performed at [facility], [address], [place], [place] [address]    Report Status 04/29/2020 FINAL  Final  Culture, Respiratory w Gram Stain     Status: None (Preliminary result)   Collection Time: 04/29/20  2:36 PM   Specimen: SPU  Result Value Ref Range Status   Specimen Description   Final    SPUTUM Performed at [facility], [address] [place], [place] [address]    Special Requests   Final    NONE Reflexed from [id number] Performed at [facility] [address] [place], [place] [address]    Gram Stain   Final    NO WBC SEEN FEW GRAM POSITIVE COCCI FEW GRAM POSITIVE RODS    Culture   Final    TOO YOUNG TO READ Performed at [facility], [place] [address], [place], [place] [address]    Report Status PENDING  Incomplete     Studies: CT Chest W Contrast  Result Date: 04/29/2020 CLINICAL DATA:  Hemoptysis EXAM: CT CHEST WITH CONTRAST TECHNIQUE: Multidetector CT imaging of the chest was performed during intravenous contrast administration. CONTRAST:  57m OMNIPAQUE IOHEXOL 300 MG/ML  SOLN COMPARISON:  09/28/2018 FINDINGS: Cardiovascular: Extensive multi-vessel coronary artery calcification. Mild cardiomegaly, unchanged. No pericardial effusion. The central pulmonary arteries are enlarged in keeping with changes of pulmonary arterial hypertension. Mild atherosclerotic calcification is seen within the thoracic aorta. No aortic aneurysm. Mediastinum/Nodes: Soft tissue within the right paratracheal region represents the unopacified azygos vein. No pathologic thoracic adenopathy. Thyroid unremarkable. Large hiatal hernia of this present containing the hepatic flexure of the colon, and the majority of the stomach., similar to prior examination. Lungs/Pleura: There is multifocal bronchiectasis noted, most severe within the right upper lobe and  lung bases bilaterally. There is marked architectural distortion of the left lower lobe. Bronchiectatic changes appear stable when compared to prior examination. No superimposed focal pulmonary infiltrates. Resection of the lateral segment of the right upper lobe, right middle  lobe, and inferior lingula have been performed. No suspicious focal pulmonary nodules or masses. No pneumothorax or pleural effusion. The central airways are patent. Upper Abdomen: Cholecystectomy has been performed. Moderate intra and marked extrahepatic biliary ductal dilation appears stable, not fully assessed on this examination. Musculoskeletal: Multilevel thoracolumbar vertebroplasty has been performed. Remote appearing T3 compression fracture noted with approximately 50% loss of height. No acute bone abnormality. IMPRESSION: Multifocal bronchiectasis, similar to prior examination. No superimposed focal pulmonary infiltrate or mass identified. No central obstructing lesion. Extensive multi-vessel coronary artery calcification. Morphologic changes in keeping with pulmonary arterial hypertension. Large hiatal hernia, similar to prior examination. Aortic Atherosclerosis (ICD10-I70.0). Electronically Signed   By: Fidela Salisbury MD   On: 04/29/2020 05:12   DG Chest Portable 1 View  Result Date: 04/29/2020 CLINICAL DATA:  Shortness of breath and hemoptysis EXAM: PORTABLE CHEST 1 VIEW COMPARISON:  09/08/2019 FINDINGS: Cardiac shadow is stable. Aortic calcifications are again seen. Multilevel vertebral augmentation is noted. Left retrocardiac consolidation is noted stable from the prior exam. Chronic fibrotic changes are noted throughout both lungs stable from the previous exam. Postsurgical changes are noted. No acute bony abnormality is seen. IMPRESSION: Chronic changes without acute abnormality Electronically Signed   By: Inez Catalina M.D.   On: 04/29/2020 01:09   ECHOCARDIOGRAM COMPLETE  Result Date: 04/30/2020    ECHOCARDIOGRAM  REPORT   Patient Name:   Julane MODENA BELLEMARE Date of Exam: 04/30/2020 Medical Rec #:  [id number]       Height:       65.0 in Accession #:    [id number]      Weight:       120.0 lb Date of Birth:  04-May-1945       BSA:          1.592 m Patient Age:    10 years        BP:           130/76 mmHg Patient Gender: F               HR:           90 bpm. Exam Location:  [facility] Procedure: 2D Echo, Cardiac Doppler, Color Doppler and Strain Analysis Indications:     Pulmonary hypertension I27.2  History:         Patient has no prior history of Echocardiogram examinations.                  Previous Myocardial Infarction; Arrythmias:Atrial Fibrillation.  Sonographer:     Sherrie Sport RDCS (AE) Referring Phys:  [id number] Loletha Grayer Diagnosing Phys: Isaias Cowman MD  Sonographer Comments: Global longitudinal strain was attempted. IMPRESSIONS  1. Left ventricular ejection fraction, by estimation, is 50 to 55%. The left ventricle has low normal function. The left ventricle has no regional wall motion abnormalities. Left ventricular diastolic parameters are consistent with Grade I diastolic dysfunction (impaired relaxation).  2. Right ventricular systolic function is normal. The right ventricular size is normal.  3. The mitral valve is normal in structure. Mild mitral valve regurgitation. No evidence of mitral stenosis.  4. The aortic valve is normal in structure. Aortic valve regurgitation is mild. No aortic stenosis is present.  5. The inferior vena cava is normal in size with greater than 50% respiratory variability, suggesting right atrial pressure of 3 mmHg. FINDINGS  Left Ventricle: Left ventricular ejection fraction, by estimation, is 50 to 55%. The left ventricle has low normal function. The left ventricle has no  regional wall motion abnormalities. The left ventricular internal cavity size was normal in size. There is no left ventricular hypertrophy. Left ventricular diastolic parameters are consistent with Grade I diastolic  dysfunction (impaired relaxation). Right Ventricle: The right ventricular size is normal. No increase in right ventricular wall thickness. Right ventricular systolic function is normal. Left Atrium: Left atrial size was normal in size. Right Atrium: Right atrial size was normal in size. Pericardium: There is no evidence of pericardial effusion. Mitral Valve: The mitral valve is normal in structure. Mild mitral valve regurgitation. No evidence of mitral valve stenosis. Tricuspid Valve: The tricuspid valve is normal in structure. Tricuspid valve regurgitation is mild . No evidence of tricuspid stenosis. Aortic Valve: The aortic valve is normal in structure. Aortic valve regurgitation is mild. No aortic stenosis is present. Aortic valve mean gradient measures 4.0 mmHg. Aortic valve peak gradient measures 7.7 mmHg. Aortic valve area, by VTI measures 1.64 cm. Pulmonic Valve: The pulmonic valve was normal in structure. Pulmonic valve regurgitation is not visualized. No evidence of pulmonic stenosis. Aorta: The aortic root is normal in size and structure. Venous: The inferior vena cava is normal in size with greater than 50% respiratory variability, suggesting right atrial pressure of 3 mmHg. IAS/Shunts: No atrial level shunt detected by color flow Doppler.  LEFT VENTRICLE PLAX 2D LVIDd:         3.25 cm  Diastology LVIDs:         2.42 cm  LV e' medial:    11.30 cm/s LV PW:         0.76 cm  LV E/e' medial:  7.0 LV IVS:        0.86 cm  LV e' lateral:   6.74 cm/s LVOT diam:     2.00 cm  LV E/e' lateral: 11.8 LV SV:         47 LV SV Index:   29 LVOT Area:     3.14 cm  RIGHT VENTRICLE RV Basal diam:  3.14 cm RV S prime:     15.00 cm/s TAPSE (M-mode): 4.2 cm LEFT ATRIUM             Index       RIGHT ATRIUM           Index LA diam:        2.80 cm 1.76 cm/m  RA Area:     13.50 cm LA Vol (A2C):   50.3 ml 31.59 ml/m RA Volume:   29.70 ml  18.65 ml/m LA Vol (A4C):   65.2 ml 40.95 ml/m LA Biplane Vol: 57.7 ml 36.24 ml/m   AORTIC VALVE                   PULMONIC VALVE AV Area (Vmax):    1.52 cm    PV Vmax:        0.72 m/s AV Area (Vmean):   1.45 cm    PV Peak grad:   2.1 mmHg AV Area (VTI):     1.64 cm    RVOT Peak grad: 3 mmHg AV Vmax:           139.00 cm/s AV Vmean:          97.400 cm/s AV VTI:            0.285 m AV Peak Grad:      7.7 mmHg AV Mean Grad:      4.0 mmHg LVOT Vmax:  67.20 cm/s LVOT Vmean:        45.000 cm/s LVOT VTI:          0.149 m LVOT/AV VTI ratio: 0.52  AORTA Ao Root diam: 2.70 cm MITRAL VALVE               TRICUSPID VALVE MV Area (PHT): 5.54 cm    TR Peak grad:   36.2 mmHg MV Decel Time: 137 msec    TR Vmax:        301.00 cm/s MV E velocity: 79.30 cm/s MV A velocity: 97.70 cm/s  SHUNTS MV E/A ratio:  0.81        Systemic VTI:  0.15 m                            Systemic Diam: 2.00 cm Isaias Cowman MD Electronically signed by Isaias Cowman MD Signature Date/Time: 04/30/2020/1:38:44 PM    Final     Scheduled Meds: . acetylcysteine  4 mL Nebulization Daily  . acidophilus  1 capsule Oral Daily  . diltiazem  120 mg Oral Daily  . guaiFENesin  600 mg Oral BID  . ipratropium-albuterol  3 mL Nebulization TID  . LORazepam  0.5 mg Oral QHS  . methylPREDNISolone (SOLU-MEDROL) injection  20 mg Intravenous Daily  . metoprolol succinate  100 mg Oral Daily  . pantoprazole  40 mg Oral Daily  . polyethylene glycol  17 g Oral Daily  . senna  2 tablet Oral QHS   Continuous Infusions: . sodium chloride 50 mL/hr at 04/30/20 0925  . [START ON 05/01/2020] amikacin (AMIKIN) IV      Assessment/Plan:  1.  Multifocal bronchiectasis with exacerbation and chronic respiratory failure.  Pulmonary change antibiotics to amikacin yesterday.  Since gram-positive cocci on sputum culture we will add back Rocephin.  COVID-19 testing negative.  Continue nebulizer treatments.  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  Start steroids today with worsening wheezing. 2.  Hemoptysis.  Continue to hold Eliquis  today.  No further hemoptysis today. 3.  Acute kidney injury.  Creatinine 1.15 on presentation.  Creatinine better today at 0.75.  Continue to monitor. 4.  Essential hypertension on Toprol and Cardizem CD 5.  Paroxysmal atrial fibrillation on Toprol and Cardizem CD.  Continue to hold Eliquis with hemoptysis. 6.  Anxiety on Ativan 7.  GERD on Protonix 8.  Echocardiogram does not show any signs of pulmonary hypertension.  Normal EF. 9.  Constipation start MiraLAX and senna 10.  Right heel redness stage I decubiti, present on admission.  Heel foam pad and pillow under right calf.     Code Status:     Code Status Orders  (From admission, onward)         Start     Ordered   04/29/20 0549  Do not attempt resuscitation (DNR)  Continuous       Question Answer Comment  In the event of cardiac or respiratory ARREST Do not call a "code blue"   In the event of cardiac or respiratory ARREST Do not perform Intubation, CPR, defibrillation or ACLS   In the event of cardiac or respiratory ARREST Use medication by any route, position, wound care, and other measures to relive pain and suffering. May use oxygen, suction and manual treatment of airway obstruction as needed for comfort.      04/29/20 0551        Code Status History  Date Active Date Inactive Code Status Order ID Comments User Context   09/08/2019 0853 09/10/2019 2215 DNR [id number]  Karmen Bongo, MD ED   09/27/2018 1816 10/11/2018 1818 DNR [id number]  Demetrios Loll, MD ED   04/19/2018 1147 04/19/2018 1549 Full Code [id number]  Hessie Knows, MD Inpatient   04/08/2018 1817 04/14/2018 1637 DNR [id number]  Tyler Pita, MD Inpatient   04/08/2018 0752 04/08/2018 1817 Full Code [id number]  Harrie Foreman, MD Inpatient   04/05/2018 1455 04/05/2018 1831 Full Code [id number]  Hessie Knows, MD Inpatient   01/05/2018 1836 01/06/2018 1504 Full Code [id number]  Hessie Knows, MD Inpatient   12/18/2017 1741 12/18/2017 2125 Full Code [id number]  Hessie Knows,  MD Inpatient   11/29/2017 1555 11/29/2017 2013 Full Code [id number]  Hessie Knows, MD Inpatient   07/31/2017 1054 08/03/2017 1524 DNR [id number]  Demetrios Loll, MD Inpatient   07/31/2017 0057 07/31/2017 1054 Full Code [id number]  Lance Coon, MD Inpatient   07/05/2016 0931 07/05/2016 1311 Full Code [id number]  Hessie Knows, MD Inpatient   Advance Care Planning Activity    Advance Directive Documentation   Flowsheet Row Most Recent Value  Type of Advance Directive Living will, Healthcare Power of Attorney  Pre-existing out of facility DNR order (yellow form or pink MOST form) --  "MOST" Form in Place? --     Family Communication: Left message for husband Disposition Plan: Status is: Inpatient  Dispo: The patient is from: Home              Anticipated d/c is to: Home              Patient currently not medically stable for disposition yet on IV antibiotics.  Started IV steroids today   Difficult to place patient.  No.  Consultants:  Pulmonary  Time spent: 28 minutes  [place]

## 2020-04-30 NOTE — Evaluation (Addendum)
Physical Therapy Evaluation Patient Details Name: Ana Washington MRN: [id number] DOB: [birth year] Today's Date: 04/30/2020   History of Present Illness  Patient is a 75 yo female that presented to the ED due to SOB, hemoptysis. PMH of afib, CAD, HTN, MI, PUD, lumbar compression fractures, thoracic compression fxs, adult bronchiectasis.    Clinical Impression  Patient alert, in bed, motivated to work with PT stating she's been in bed since Tuesday. Denied pain. Pt on 2L via [place], turned up to 4L in prep for OOB mobility, RN notified. Returned to 2L at end of session and spO2 97%. The patient was able to provide clear PLOF; lives in a one story home with her husband, has a daily care giver, and family support from her son and sister.   Pt performed bed mobility modI. Fair sitting balance noted. Sit <> stand with RW and CGA, pt with quickened steps to transfer to the recliner and exhibited significant DOE as well as anxiety after limited ambulation. Pt needed several minutes to recover and cueing for relaxation techniques, repositioned in chair for comfort.  Overall the patient demonstrated deficits (see "PT Problem List") that impede the patient's functional abilities, safety, and mobility and would benefit from skilled PT intervention. Recommendation is HHPT pending further mobility progress with supervision for mobility/OOB.     Follow Up Recommendations Home health PT;Supervision for mobility/OOB    Equipment Recommendations  None recommended by PT    Recommendations for Other Services       Precautions / Restrictions Precautions Precautions: Fall Restrictions Weight Bearing Restrictions: No      Mobility  Bed Mobility Overal bed mobility: Modified Independent                  Transfers Overall transfer level: Needs assistance Equipment used: Rolling walker (2 wheeled) Transfers: Sit to/from Stand Sit to Stand: Min guard            Ambulation/Gait Ambulation/Gait  assistance: Min guard Gait Distance (Feet): 2 Feet Assistive device: Rolling walker (2 wheeled)       General Gait Details: pt able to turn and take several steps to recliner in room. further ambulation deferred due to pts DOE. 4L via [place] prior to mobility and during mobility attempts, returned to 2L at end of session.  Stairs            Wheelchair Mobility    Modified Rankin (Stroke Patients Only)       Balance Overall balance assessment: Needs assistance Sitting-balance support: Feet supported Sitting balance-Leahy Scale: Good       Standing balance-Leahy Scale: Poor Standing balance comment: reliant on UE support                             Pertinent Vitals/Pain Pain Assessment: No/denies pain    Home Living Family/patient expects to be discharged to:: Private residence Living Arrangements: Spouse/significant other Available Help at Discharge: Family;Available PRN/intermittently;Personal care attendant (aide comes 9-1pm daily, son comes daily with food, sister is also able to check in at night) Type of Home: House Home Access: Level entry     Home Layout: One level Home Equipment: Walker - 2 wheels;Bedside commode;Tub bench;Wheelchair - Education officer, community - power Additional Comments: Her sister lives close and takes care of her    Prior Function Level of Independence: Independent with assistive device(s)               Hand Dominance  Dominant Hand: Right    Extremity/Trunk Assessment   Upper Extremity Assessment Upper Extremity Assessment: Generalized weakness    Lower Extremity Assessment Lower Extremity Assessment: Generalized weakness    Cervical / Trunk Assessment Cervical / Trunk Assessment: Kyphotic  Communication   Communication: No difficulties  Cognition Arousal/Alertness: Awake/alert Behavior During Therapy: WFL for tasks assessed/performed Overall Cognitive Status: Within Functional Limits for tasks assessed                                         General Comments      Exercises     Assessment/Plan    PT Assessment Patient needs continued PT services  PT Problem List Decreased strength;Decreased cognition;Decreased range of motion;Decreased activity tolerance;Decreased balance;Decreased mobility;Cardiopulmonary status limiting activity;Decreased knowledge of precautions       PT Treatment Interventions DME instruction;Balance training;Gait training;Neuromuscular re-education;Stair training;Functional mobility training;Patient/family education;Therapeutic activities;Therapeutic exercise    PT Goals (Current goals can be found in the Care Plan section)  Acute Rehab PT Goals Patient Stated Goal: to breathe better PT Goal Formulation: With patient Time For Goal Achievement: 05/14/20 Potential to Achieve Goals: Good    Frequency Min 2X/week   Barriers to discharge        Co-evaluation               AM-PAC PT "6 Clicks" Mobility  Outcome Measure Help needed turning from your back to your side while in a flat bed without using bedrails?: A Little Help needed moving from lying on your back to sitting on the side of a flat bed without using bedrails?: A Little Help needed moving to and from a bed to a chair (including a wheelchair)?: A Little Help needed standing up from a chair using your arms (e.g., wheelchair or bedside chair)?: A Little Help needed to walk in hospital room?: A Lot Help needed climbing 3-5 steps with a railing? : A Lot 6 Click Score: 16    End of Session Equipment Utilized During Treatment: Gait belt Activity Tolerance: Other (comment);Treatment limited secondary to medical complications (Comment);Patient tolerated treatment well (pt anxious, limited by SOb) Patient left: in chair;with call bell/phone within reach;with chair alarm set Nurse Communication: Mobility status PT Visit Diagnosis: Other abnormalities of gait and mobility (R26.89);Muscle  weakness (generalized) (M62.81);Difficulty in walking, not elsewhere classified (R26.2)    Time: [id number] PT Time Calculation (min) (ACUTE ONLY): 34 min   Charges:   PT Evaluation $PT Eval Moderate Complexity: 1 Mod PT Treatments $Therapeutic Activity: 23-37 mins       Lieutenant Diego PT, DPT 4:33 PM,04/30/20

## 2020-05-01 ENCOUNTER — Encounter: Payer: Self-pay | Admitting: Family Medicine

## 2020-05-01 LAB — BASIC METABOLIC PANEL
Anion gap: 5 (ref 5–15)
BUN: 15 mg/dL (ref 8–23)
CO2: 33 mmol/L — ABNORMAL HIGH (ref 22–32)
Calcium: 8.2 mg/dL — ABNORMAL LOW (ref 8.9–10.3)
Chloride: 102 mmol/L (ref 98–111)
Creatinine, Ser: 0.46 mg/dL (ref 0.44–1.00)
GFR, Estimated: >60 mL/min (ref >60)
Glucose, Bld: 124 mg/dL — ABNORMAL HIGH (ref 70–99)
Potassium: 4.5 mmol/L (ref 3.5–5.1)
Sodium: 140 mmol/L (ref 135–145)

## 2020-05-01 LAB — ACID FAST SMEAR (AFB, MYCOBACTERIA): Acid Fast Smear: NEGATIVE

## 2020-05-01 LAB — CBC WITH DIFFERENTIAL/PLATELET
Abs Immature Granulocytes: 0.13 10*3/uL — ABNORMAL HIGH (ref 0.00–0.07)
Basophils Absolute: 0 10*3/uL (ref 0.0–0.1)
Basophils Relative: 0 %
Eosinophils Absolute: 0 10*3/uL (ref 0.0–0.5)
Eosinophils Relative: 0 %
HCT: 35 % — ABNORMAL LOW (ref 36.0–46.0)
Hemoglobin: 11.4 g/dL — ABNORMAL LOW (ref 12.0–15.0)
Immature Granulocytes: 1 %
Lymphocytes Relative: 4 %
Lymphs Abs: 0.5 10*3/uL — ABNORMAL LOW (ref 0.7–4.0)
MCH: 32.9 pg (ref 26.0–34.0)
MCHC: 32.6 g/dL (ref 30.0–36.0)
MCV: 101.2 fL — ABNORMAL HIGH (ref 80.0–100.0)
Monocytes Absolute: 0.7 10*3/uL (ref 0.1–1.0)
Monocytes Relative: 5 %
Neutro Abs: 12 10*3/uL — ABNORMAL HIGH (ref 1.7–7.7)
Neutrophils Relative %: 90 %
Platelets: 242 10*3/uL (ref 150–400)
RBC: 3.46 MIL/uL — ABNORMAL LOW (ref 3.87–5.11)
RDW: 11.8 % (ref 11.5–15.5)
WBC: 13.3 10*3/uL — ABNORMAL HIGH (ref 4.0–10.5)
nRBC: 0 % (ref 0.0–0.2)

## 2020-05-01 LAB — CULTURE, RESPIRATORY W GRAM STAIN
Culture: NORMAL
Gram Stain: NONE SEEN

## 2020-05-01 LAB — AMIKACIN LEVEL: Amikacin Rm: 5.6 ug/mL (ref 1.0–30.0)

## 2020-05-01 MED ORDER — DILTIAZEM HCL 30 MG PO TABS
30.0000 mg | ORAL_TABLET | Freq: Two times a day (BID) | ORAL | Status: DC
  Administered 2020-05-01 – 2020-05-02 (×3): 30 mg via ORAL
  Filled 2020-05-01 (×3): qty 1

## 2020-05-01 NOTE — Progress Notes (Addendum)
Pharmacy - Brief Note- Amikacin follow-up  See pharmacist's note re: Amikacin dosing from earlier today  Amikacin '800mg'$  x 1 given last 3/30 at 18:54.    3/31 random amikacin level at 08:17 = 5.6 (13.25hr from start of infusion)    Scr 0.46  Sputum cx: Normal flora (Gram stain GPC and GNR)  Plan:  Based on random Amikacin level 3/31 and dosing nomogram, continue Amikacin '800mg'$  q36h. Level is on the line for q24h vs q36h dose, based on age will continue current dose as above  Estimate peak = 60 mcg/ml and trough < 1 mcg/ml  Check SCr in am  Doreene Eland, PharmD, BCPS.   Work Cell: [phone] 05/01/2020 1:07 PM

## 2020-05-01 NOTE — Care Management Important Message (Signed)
Important Message  Patient Details  Name: Ana Washington MRN: [id number] Date of Birth: Dec 11, 1945   Medicare Important Message Given:  Yes     Dannette Barbara 05/01/2020, 1:52 PM

## 2020-05-01 NOTE — Plan of Care (Signed)

## 2020-05-01 NOTE — Progress Notes (Signed)
Mobility Specialist - Progress Note   05/01/20 1100  Mobility  Activity Ambulated in room;Transferred to/from BSC  Range of Motion/Exercises Right leg;Left leg (TR, HR, SLR, ABD, ISO, SM)  Level of Assistance Modified independent, requires aide device or extra time  Assistive Device Front wheel walker  Distance Ambulated (ft) 30 ft  Mobility Response Tolerated well  Mobility performed by Mobility specialist  $Mobility charge 1 Mobility    Pre-mobility: 76 HR, 99% SpO2 During mobility: 85 HR, 96% SpO2 Post-mobility: 78 HR, 94% SpO2   Pt ambulated in room with RW. ModI. Voiced SOB, O2 maintained mid-high 90s on 3L. Upon return to recliner, pt participated in seated therex: toe raises, heel raises, straight leg raises, abduction, isometrics, and seated march. Pt requested to use BSC for BM, assistance with peri-care.    Kathee Delton Mobility Specialist 05/01/20, 11:27 AM

## 2020-05-01 NOTE — Progress Notes (Signed)
Pharmacy Antibiotic Note  Ana Washington is a 75 y.o. female admitted on 04/29/2020. Pharmacy originally consulted for ceftazidime dosing. Antibiotics changed to amikacin per Pulmonology. Scr improving. Started on ceftriaxone 3/31.   Plan: Amikacin 800 mg IV x 1 on 3/30.    Random amikacin level pending but may take several days to result (appears to be send-out lab)  Start Amikacin '800mg'$  q36h for a peak = 60 mcg/ml and trough < 1 mcg/ml  Check SCr in am    Height: '5\' 5"'$  (165.1 cm) Weight: 54.8 kg (120 lb 13 oz) IBW/kg (Calculated) : 57  Temp (24hrs), Avg:98 F (36.7 C), Min:97.9 F (36.6 C), Max:98.2 F (36.8 C)  Recent Labs  Lab 04/29/20 0108 04/29/20 0235 04/30/20 0458 04/30/20 0817 05/01/20 0457  WBC 21.6*  --  12.5*  --  13.3*  CREATININE 1.15*  --  0.75  --  0.46  LATICACIDVEN  --  1.1  --   --   --   AMIKACIN  --   --   --  5.6  --     Estimated Creatinine Clearance: 52.6 mL/min (by C-G formula based on SCr of 0.46 mg/dL).    Allergies  Allergen Reactions  . Codeine Nausea And Vomiting  . Sulfa Antibiotics Diarrhea  . Penicillins Rash    Has patient had a PCN reaction causing immediate rash, facial/tongue/throat swelling, SOB or lightheadedness with hypotension: Unknown Has patient had a PCN reaction causing severe rash involving mucus membranes or skin necrosis: Unknown Has patient had a PCN reaction that required hospitalization: Unknown Has patient had a PCN reaction occurring within the last 10 years: No If all of the above answers are "NO", then may proceed with Cephalosporin use.     Antimicrobials this admission: Ceftriaxone 3/30 >> Azithromycin 3/30 x 1 Amikacin 3/30 >>   Microbiology results: 3/30 BCx: NGTD 3/30 UCx: 10,000 COLONIES/mL INSIGNIFICANT GROWTH  3/30 Sputum Cx: NO WBC SEEN FEW GRAM POSITIVE COCCI FEW GRAM POSITIVE RODS 3/30 fungus without smear Expectorated Sputum Cx: pending.    Thank you for allowing pharmacy to be a part of  this patient's care.  Oswald Hillock, PharmD 05/01/2020 7:49 AM

## 2020-05-01 NOTE — Progress Notes (Signed)
Pulmonary Medicine          Date: 05/01/2020,   MRN# [id number] Ana Washington 04/16/1945     AdmissionWeight: 50.3 kg                 CurrentWeight: 54.8 kg    Referring physician: Dr Sidney Ace  CHIEF COMPLAINT:   Acute on chronic hypoxemic respiratory failure   HISTORY OF PRESENT ILLNESS   Very pleasant 75 yo well known to our service with recurrent respiratory infections and chronic brnochitis from advanced sacular cavitary bronchiectasis with MAI and recurrent overgrowth of flora and episodic non-massive hemoptysis. She was seen by me in office with hypotension and severe cough.  She mildly improved but not good enough with continued blood streaked expectorate.   04/30/20- patient is improving, she has less cough today.  She had 1 dose of amikacin thus far. She responded well to this, renal function is stable. He phelgm is less this morning. She is using metaneb with Respiratory therapist. She received DuoNeb I have discussed with her this may cause worsening AFrVR she will minimize the frequency.   05/01/20-  Patient is improved clinically this am, she is now not coughing up any phlegm or blood. She feels well and was able to walk with OT/PT service on 2L nasal canula without desaturation.  She is appreciative of care.    PAST MEDICAL HISTORY   Past Medical History:  Diagnosis Date  . Arthritis   . Atrial fibrillation ([place])   . Bronchiectasis ([place])   . CAD (coronary artery disease) 07/30/2017  . Dumping syndrome   . 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 Family history of adverse reaction to anesthesia    sister [name]  . GERD (gastroesophageal reflux disease)   . Headache    MIGRAINES  . Myocardial infarction ([place]) 2007   Non-STEMI  . [name] (postoperative nausea and vomiting)   . Psoriasis   . PUD (peptic ulcer disease)      SURGICAL HISTORY   Past Surgical History:  Procedure Laterality Date  . BACK SURGERY    . CHOLECYSTECTOMY    .  ESOPHAGOGASTRODUODENOSCOPY (EGD) WITH PROPOFOL N/A 03/19/2019   Procedure: ESOPHAGOGASTRODUODENOSCOPY (EGD) WITH PROPOFOL;  Surgeon: [name], MD;  Location: [facility] ENDOSCOPY;  Service: Gastroenterology;  Laterality: N/A;  *Note to anesthesia: Per pt's pulmonologist, if intubating, please extubate to BIPAP.  [name] EYE SURGERY    . FOOT SURGERY    . INTRAMEDULLARY (IM) NAIL INTERTROCHANTERIC Right 09/30/2018   Procedure: INTRAMEDULLARY (IM) NAIL INTERTROCHANTRIC;  Surgeon: Dereck Leep, MD;  Location: [facility] ORS;  Service: Orthopedics;  Laterality: Right;  . KYPHOPLASTY N/A 07/05/2016   Procedure: KYPHOPLASTY T - 9;  Surgeon: Hessie Knows, MD;  Location: [facility] ORS;  Service: Orthopedics;  Laterality: N/A;  . KYPHOPLASTY N/A 11/29/2017   Procedure: Iona Hansen;  Surgeon: Hessie Knows, MD;  Location: [facility] ORS;  Service: Orthopedics;  Laterality: N/A;  L2 and L3  . KYPHOPLASTY N/A 12/18/2017   Procedure: KYPHOPLASTY L1;  Surgeon: Hessie Knows, MD;  Location: [facility] ORS;  Service: Orthopedics;  Laterality: N/A;  . KYPHOPLASTY N/A 01/05/2018   Procedure: KYPHOPLASTY-T11,T12;  Surgeon: Hessie Knows, MD;  Location: [facility] ORS;  Service: Orthopedics;  Laterality: N/A;  . KYPHOPLASTY N/A 04/05/2018   Procedure: T10 KYPHOPLASTY;  Surgeon: Hessie Knows, MD;  Location: [facility] ORS;  Service: Orthopedics;  Laterality: N/A;  . KYPHOPLASTY N/A 04/12/2018   Procedure: KYPHOPLASTY T7,8;  Surgeon: Hessie Knows, MD;  Location: [facility] ORS;  Service: Orthopedics;  Laterality: N/A;  . KYPHOPLASTY N/A 04/19/2018   Procedure: KYPHOPLASTY T5, T6;  Surgeon: Hessie Knows, MD;  Location: [facility] ORS;  Service: Orthopedics;  Laterality: N/A;  . LUNG SURGERY  1990 and 1996  . THOROCOTOMY WITH LOBECTOMY     LEFT LOWER THORACOTOMY / RIGHT MIDDLE LOBECTOMY     FAMILY HISTORY   Family History  Problem Relation Age of Onset  . Hypertension Mother   . Hypertension Father      SOCIAL HISTORY   Social History   Tobacco Use  .  Smoking status: Never Smoker  . Smokeless tobacco: Never Used  Vaping Use  . Vaping Use: Never used  Substance Use Topics  . Alcohol use: No  . Drug use: No     MEDICATIONS    Home Medication:    Current Medication:  Current Facility-Administered Medications:  .  acetaminophen (TYLENOL) tablet 650 mg, 650 mg, Oral, Q6H PRN, 650 mg at 04/30/20 2210 **OR** acetaminophen (TYLENOL) suppository 650 mg, 650 mg, Rectal, Q6H PRN, Mansy, Jan A, MD .  acetylcysteine (MUCOMYST) 20 % nebulizer / oral solution 4 mL, 4 mL, Nebulization, Daily, Leslye Peer, Richard, MD, 4 mL at 05/01/20 0752 .  acidophilus (RISAQUAD) capsule 1 capsule, 1 capsule, Oral, Daily, Leslye Peer, Richard, MD, 1 capsule at 05/01/20 0914 .  amikacin (AMIKIN) 800 mg in dextrose 5 % 100 mL IVPB, 800 mg, Intravenous, Q36H, Berton Mount, RPH, Last Rate: 103.2 mL/hr at 05/01/20 0920, 800 mg at 05/01/20 0920 .  benzonatate (TESSALON) capsule 200 mg, 200 mg, Oral, TID PRN, Leslye Peer, Richard, MD .  cefTRIAXone (ROCEPHIN) 2 g in sodium chloride 0.9 % 100 mL IVPB, 2 g, Intravenous, Q24H, Wieting, Richard, MD, Last Rate: 200 mL/hr at 04/30/20 1701, 2 g at 04/30/20 1701 .  dextromethorphan-guaiFENesin (MUCINEX DM) 30-600 MG per 12 hr tablet 1 tablet, 1 tablet, Oral, BID PRN, Mansy, Jan A, MD .  diltiazem (CARDIZEM) tablet 30 mg, 30 mg, Oral, Q12H, Karolynn Infantino, MD, 30 mg at 05/01/20 [id number] .  furosemide (LASIX) tablet 20 mg, 20 mg, Oral, Daily PRN, Wieting, Richard, MD .  gabapentin (NEURONTIN) capsule 300 mg, 300 mg, Oral, BID PRN, Loletha Grayer, MD, 300 mg at 04/30/20 2210 .  glycopyrrolate (ROBINUL) tablet 1 mg, 1 mg, Oral, TID PRN, Loletha Grayer, MD .  guaiFENesin [facility]) 12 hr tablet 600 mg, 600 mg, Oral, BID, Mansy, Jan A, MD, 600 mg at 05/01/20 0914 .  ipratropium-albuterol (DUONEB) 0.5-2.5 (3) MG/3ML nebulizer solution 3 mL, 3 mL, Nebulization, TID, Leslye Peer, Richard, MD, 3 mL at 05/01/20 0751 .  LORazepam (ATIVAN) tablet 0.5  mg, 0.5 mg, Oral, QHS, Wieting, Richard, MD, 0.5 mg at 04/30/20 2210 .  magnesium hydroxide (MILK OF MAGNESIA) suspension 30 mL, 30 mL, Oral, Daily PRN, Mansy, Jan A, MD .  methylPREDNISolone sodium succinate (SOLU-MEDROL) 40 mg/mL injection 20 mg, 20 mg, Intravenous, Daily, Lanney Gins, Aubrionna Istre, MD, 20 mg at 05/01/20 0914 .  metoprolol succinate (TOPROL-XL) 24 hr tablet 100 mg, 100 mg, Oral, Daily, Leslye Peer, Richard, MD, 100 mg at 05/01/20 0914 .  ondansetron (ZOFRAN) tablet 4 mg, 4 mg, Oral, Q6H PRN **OR** ondansetron (ZOFRAN) injection 4 mg, 4 mg, Intravenous, Q6H PRN, Mansy, Jan A, MD .  pantoprazole (PROTONIX) EC tablet 40 mg, 40 mg, Oral, Daily, Leslye Peer, Richard, MD, 40 mg at 05/01/20 0914 .  polyethylene glycol (MIRALAX / GLYCOLAX) packet 17 g, 17 g, Oral, Daily, Loletha Grayer, MD, 17 g at 05/01/20 0914 .  senna (SENOKOT)  tablet 17.2 mg, 2 tablet, Oral, QHS, Wieting, Richard, MD, 17.2 mg at 04/30/20 2030 .  traZODone (DESYREL) tablet 25 mg, 25 mg, Oral, QHS PRN, Mansy, Jan A, MD    ALLERGIES   Codeine, Sulfa antibiotics, and Penicillins     REVIEW OF SYSTEMS    Review of Systems:  Gen:  Denies  fever, sweats, chills weigh loss  HEENT: Denies blurred vision, double vision, ear pain, eye pain, hearing loss, nose bleeds, sore throat Cardiac:  No dizziness, chest pain or heaviness, chest tightness,edema Resp:   Denies cough or sputum porduction, shortness of breath,wheezing, hemoptysis,  Gi: Denies swallowing difficulty, stomach pain, nausea or vomiting, diarrhea, constipation, bowel incontinence Gu:  Denies bladder incontinence, burning urine Ext:   Denies Joint pain, stiffness or swelling Skin: Denies  skin rash, easy bruising or bleeding or hives Endoc:  Denies polyuria, polydipsia , polyphagia or weight change Psych:   Denies depression, insomnia or hallucinations   Other:  All other systems negative   VS: BP (!) 149/91 (BP Location: Right Arm)   Pulse 86   Temp 98.7 F  (37.1 C) (Oral)   Resp 16   Ht '5\' 5"'$  (1.651 m)   Wt 54.8 kg   SpO2 96%   BMI 20.10 kg/m      PHYSICAL EXAM    GENERAL:NAD, no fevers, chills, no weakness no fatigue HEAD: Normocephalic, atraumatic.  EYES: Pupils equal, round, reactive to light. Extraocular muscles intact. No scleral icterus.  MOUTH: Moist mucosal membrane. Dentition intact. No abscess noted.  EAR, NOSE, THROAT: Clear without exudates. No external lesions.  NECK: Supple. No thyromegaly. No nodules. No JVD.  PULMONARY: rhonchi bilaterally  CARDIOVASCULAR: S1 and S2. Regular rate and rhythm. No murmurs, rubs, or gallops. No edema. Pedal pulses 2+ bilaterally.  GASTROINTESTINAL: Soft, nontender, nondistended. No masses. Positive bowel sounds. No hepatosplenomegaly.  MUSCULOSKELETAL: No swelling, clubbing, or edema. Range of motion full in all extremities.  NEUROLOGIC: Cranial nerves II through XII are intact. No gross focal neurological deficits. Sensation intact. Reflexes intact.  SKIN: No ulceration, lesions, rashes, or cyanosis. Skin warm and dry. Turgor intact.  PSYCHIATRIC: Mood, affect within normal limits. The patient is awake, alert and oriented x 3. Insight, judgment intact.       IMAGING    CT Chest W Contrast  Result Date: 04/29/2020 CLINICAL DATA:  Hemoptysis EXAM: CT CHEST WITH CONTRAST TECHNIQUE: Multidetector CT imaging of the chest was performed during intravenous contrast administration. CONTRAST:  47m OMNIPAQUE IOHEXOL 300 MG/ML  SOLN COMPARISON:  09/28/2018 FINDINGS: Cardiovascular: Extensive multi-vessel coronary artery calcification. Mild cardiomegaly, unchanged. No pericardial effusion. The central pulmonary arteries are enlarged in keeping with changes of pulmonary arterial hypertension. Mild atherosclerotic calcification is seen within the thoracic aorta. No aortic aneurysm. Mediastinum/Nodes: Soft tissue within the right paratracheal region represents the unopacified azygos vein. No  pathologic thoracic adenopathy. Thyroid unremarkable. Large hiatal hernia of this present containing the hepatic flexure of the colon, and the majority of the stomach., similar to prior examination. Lungs/Pleura: There is multifocal bronchiectasis noted, most severe within the right upper lobe and lung bases bilaterally. There is marked architectural distortion of the left lower lobe. Bronchiectatic changes appear stable when compared to prior examination. No superimposed focal pulmonary infiltrates. Resection of the lateral segment of the right upper lobe, right middle lobe, and inferior lingula have been performed. No suspicious focal pulmonary nodules or masses. No pneumothorax or pleural effusion. The central airways are patent. Upper Abdomen: Cholecystectomy has been  performed. Moderate intra and marked extrahepatic biliary ductal dilation appears stable, not fully assessed on this examination. Musculoskeletal: Multilevel thoracolumbar vertebroplasty has been performed. Remote appearing T3 compression fracture noted with approximately 50% loss of height. No acute bone abnormality. IMPRESSION: Multifocal bronchiectasis, similar to prior examination. No superimposed focal pulmonary infiltrate or mass identified. No central obstructing lesion. Extensive multi-vessel coronary artery calcification. Morphologic changes in keeping with pulmonary arterial hypertension. Large hiatal hernia, similar to prior examination. Aortic Atherosclerosis (ICD10-I70.0). Electronically Signed   By: Fidela Salisbury MD   On: 04/29/2020 05:12   DG Chest Portable 1 View  Result Date: 04/29/2020 CLINICAL DATA:  Shortness of breath and hemoptysis EXAM: PORTABLE CHEST 1 VIEW COMPARISON:  09/08/2019 FINDINGS: Cardiac shadow is stable. Aortic calcifications are again seen. Multilevel vertebral augmentation is noted. Left retrocardiac consolidation is noted stable from the prior exam. Chronic fibrotic changes are noted throughout both lungs  stable from the previous exam. Postsurgical changes are noted. No acute bony abnormality is seen. IMPRESSION: Chronic changes without acute abnormality Electronically Signed   By: Inez Catalina M.D.   On: 04/29/2020 01:09   ECHOCARDIOGRAM COMPLETE  Result Date: 04/30/2020    ECHOCARDIOGRAM REPORT   Patient Name:   Ana Washington Date of Exam: 04/30/2020 Medical Rec #:  [id number]       Height:       65.0 in Accession #:    [id number]      Weight:       120.0 lb Date of Birth:  Sep 05, 1945       BSA:          1.592 m Patient Age:    27 years        BP:           130/76 mmHg Patient Gender: F               HR:           90 bpm. Exam Location:  [facility] Procedure: 2D Echo, Cardiac Doppler, Color Doppler and Strain Analysis Indications:     Pulmonary hypertension I27.2  History:         Patient has no prior history of Echocardiogram examinations.                  Previous Myocardial Infarction; Arrythmias:Atrial Fibrillation.  Sonographer:     Sherrie Sport RDCS (AE) Referring Phys:  [id number] Loletha Grayer Diagnosing Phys: Isaias Cowman MD  Sonographer Comments: Global longitudinal strain was attempted. IMPRESSIONS  1. Left ventricular ejection fraction, by estimation, is 50 to 55%. The left ventricle has low normal function. The left ventricle has no regional wall motion abnormalities. Left ventricular diastolic parameters are consistent with Grade I diastolic dysfunction (impaired relaxation).  2. Right ventricular systolic function is normal. The right ventricular size is normal.  3. The mitral valve is normal in structure. Mild mitral valve regurgitation. No evidence of mitral stenosis.  4. The aortic valve is normal in structure. Aortic valve regurgitation is mild. No aortic stenosis is present.  5. The inferior vena cava is normal in size with greater than 50% respiratory variability, suggesting right atrial pressure of 3 mmHg. FINDINGS  Left Ventricle: Left ventricular ejection fraction, by estimation, is 50 to  55%. The left ventricle has low normal function. The left ventricle has no regional wall motion abnormalities. The left ventricular internal cavity size was normal in size. There is no left ventricular hypertrophy. Left ventricular diastolic parameters are consistent with Grade  I diastolic dysfunction (impaired relaxation). Right Ventricle: The right ventricular size is normal. No increase in right ventricular wall thickness. Right ventricular systolic function is normal. Left Atrium: Left atrial size was normal in size. Right Atrium: Right atrial size was normal in size. Pericardium: There is no evidence of pericardial effusion. Mitral Valve: The mitral valve is normal in structure. Mild mitral valve regurgitation. No evidence of mitral valve stenosis. Tricuspid Valve: The tricuspid valve is normal in structure. Tricuspid valve regurgitation is mild . No evidence of tricuspid stenosis. Aortic Valve: The aortic valve is normal in structure. Aortic valve regurgitation is mild. No aortic stenosis is present. Aortic valve mean gradient measures 4.0 mmHg. Aortic valve peak gradient measures 7.7 mmHg. Aortic valve area, by VTI measures 1.64 cm. Pulmonic Valve: The pulmonic valve was normal in structure. Pulmonic valve regurgitation is not visualized. No evidence of pulmonic stenosis. Aorta: The aortic root is normal in size and structure. Venous: The inferior vena cava is normal in size with greater than 50% respiratory variability, suggesting right atrial pressure of 3 mmHg. IAS/Shunts: No atrial level shunt detected by color flow Doppler.  LEFT VENTRICLE PLAX 2D LVIDd:         3.25 cm  Diastology LVIDs:         2.42 cm  LV e' medial:    11.30 cm/s LV PW:         0.76 cm  LV E/e' medial:  7.0 LV IVS:        0.86 cm  LV e' lateral:   6.74 cm/s LVOT diam:     2.00 cm  LV E/e' lateral: 11.8 LV SV:         47 LV SV Index:   29 LVOT Area:     3.14 cm  RIGHT VENTRICLE RV Basal diam:  3.14 cm RV S prime:     15.00 cm/s TAPSE  (M-mode): 4.2 cm LEFT ATRIUM             Index       RIGHT ATRIUM           Index LA diam:        2.80 cm 1.76 cm/m  RA Area:     13.50 cm LA Vol (A2C):   50.3 ml 31.59 ml/m RA Volume:   29.70 ml  18.65 ml/m LA Vol (A4C):   65.2 ml 40.95 ml/m LA Biplane Vol: 57.7 ml 36.24 ml/m  AORTIC VALVE                   PULMONIC VALVE AV Area (Vmax):    1.52 cm    PV Vmax:        0.72 m/s AV Area (Vmean):   1.45 cm    PV Peak grad:   2.1 mmHg AV Area (VTI):     1.64 cm    RVOT Peak grad: 3 mmHg AV Vmax:           139.00 cm/s AV Vmean:          97.400 cm/s AV VTI:            0.285 m AV Peak Grad:      7.7 mmHg AV Mean Grad:      4.0 mmHg LVOT Vmax:         67.20 cm/s LVOT Vmean:        45.000 cm/s LVOT VTI:          0.149 m LVOT/AV VTI ratio:  0.22  AORTA Ao Root diam: 2.70 cm MITRAL VALVE               TRICUSPID VALVE MV Area (PHT): 5.54 cm    TR Peak grad:   36.2 mmHg MV Decel Time: 137 msec    TR Vmax:        301.00 cm/s MV E velocity: 79.30 cm/s MV A velocity: 97.70 cm/s  SHUNTS MV E/A ratio:  0.81        Systemic VTI:  0.15 m                            Systemic Diam: 2.00 cm Isaias Cowman MD Electronically signed by Isaias Cowman MD Signature Date/Time: 04/30/2020/1:38:44 PM    Final       ASSESSMENT/PLAN   Acute on chronic hypoxemic respiratory failure - due to acute exacerbation of bronchiectasis -s/p amikacin dose #2 -patient imporved now d/c planning -chest PT dc-Mucomyst 20% 80m BID -continue Rubinol '1mg'$  tid  - PT/OT -fungal cultures -fungitell serum -AFB expectorated sputum  - aerobic/anaerobic cultures -sputum -duoneb  -continue solumedrol decreased to 20 today  Non-massive hemoptysis-resolved  caution with eliquis for AF  - improved   - related to chronic bronchiectasis with exacerbation   - infectious etiology   - continue amikacin for now appreciate pharmacy input      Thank you for allowing me to participate in the care of this patient.   Patient/Family are  satisfied with care plan and all questions have been answered.  This document was prepared using Dragon voice recognition software and may include unintentional dictation errors.     FOttie Glazier M.D.  Division of [facility]

## 2020-05-01 NOTE — Evaluation (Signed)
Occupational Therapy Evaluation Patient Details Name: Ana Washington MRN: [id number] DOB: 03-12-1945 Today's Date: 05/01/2020    History of Present Illness Patient is a 75 yo female that presented to the ED due to SOB, hemoptysis. Admitted with bronchiectasis exacerbation. PMH of afib, CAD, HTN, MI, PUD, lumbar compression fractures, thoracic compression fxs, adult bronchiectasis.   Clinical Impression   Pt seen for OT evaluation this date. Upon arrival to room, pt awake and seated upright in bed on 2L of O2 via [place]. Pt reporting no pain and agreeable to OT eval/tx. Prior to admission, pt was living in a 1-story home with spouse, using 2L of O2, walking household distance with RW, and receiving MIN A from PCA for ADLs. Pt currently presents with decreased activity tolerance, and required MIN GUARD for ambulating ~12 ft with RW and SUPERVISION for seated UB/LB dressing. Pt reporting improved breathing this date, with SpO2 >94% throughout. Of note, pt stated "I feel like my heart is racing", however HR 80s-100s throughout session. Pt would benefit from additionally skilled OT to improve activity tolerance, maximize return to PLOF, and minimize risk of future falls, injury, caregiver burden, and readmission. Upon discharge, recommend HHOT services and supervision/assistance from family as needed.     Follow Up Recommendations  Home health OT;Supervision - Intermittent    Equipment Recommendations  None recommended by OT       Precautions / Restrictions Precautions Precautions: Fall Restrictions Weight Bearing Restrictions: No      Mobility Bed Mobility Overal bed mobility: Modified Independent                  Transfers Overall transfer level: Needs assistance Equipment used: Rolling walker (2 wheeled) Transfers: Sit to/from Stand Sit to Stand: Min guard              Balance Overall balance assessment: Needs assistance Sitting-balance support: No upper extremity  supported;Feet supported Sitting balance-Leahy Scale: Good Sitting balance - Comments: Pt able to bend over and reach beyond BOS to don/doff socks with no LOB observed     Standing balance-Leahy Scale: Poor Standing balance comment: reliant on UE support from RW                           ADL either performed or assessed with clinical judgement   ADL Overall ADL's : Needs assistance/impaired Eating/Feeding: Set up;Sitting Eating/Feeding Details (indicate cue type and reason): SET-UP assist required to opening food packaging d/t decreased fine motor skills and trigger fingers             Upper Body Dressing : Supervision/safety;Set up;Sitting Upper Body Dressing Details (indicate cue type and reason): To don/doff hospital gown Lower Body Dressing: Supervision/safety;Set up;Sitting/lateral leans Lower Body Dressing Details (indicate cue type and reason): To doff/don socks             Functional mobility during ADLs: Min guard;Rolling walker (To walk ~51f)       Vision Baseline Vision/History: Wears glasses Wears Glasses: At all times              Pertinent Vitals/Pain Pain Assessment: No/denies pain     Hand Dominance Right   Extremity/Trunk Assessment Upper Extremity Assessment Upper Extremity Assessment: Generalized weakness (Decreased fine motor skills. Pt endorses having trigger fingers)   Lower Extremity Assessment Lower Extremity Assessment: Generalized weakness       Communication Communication Communication: No difficulties   Cognition Arousal/Alertness: Awake/alert Behavior During Therapy:  WFL for tasks assessed/performed Overall Cognitive Status: Within Functional Limits for tasks assessed                                                Home Living Family/patient expects to be discharged to:: Private residence Living Arrangements: Spouse/significant other Available Help at Discharge: Family;Available  PRN/intermittently;Personal care attendant (aide comes 9-1pm daily, son comes daily with food, sister is also able to check in at night) Type of Home: House Home Access: Level entry     Home Layout: One level     Bathroom Shower/Tub: Teacher, early years/pre: Handicapped height     Home Equipment: Environmental consultant - 2 wheels;Bedside commode;Tub bench;Wheelchair - Education officer, community - power   Additional Comments: Her sister lives close and takes care of her      Prior Functioning/Environment Level of Independence: Needs assistance  Gait / Transfers Assistance Needed: Primarily household ambulatory with RW. ADL's / Homemaking Assistance Needed: Aid assists with ADLs and IADLs.            OT Problem List: Decreased strength;Decreased activity tolerance;Impaired balance (sitting and/or standing)      OT Treatment/Interventions: Self-care/ADL training;Therapeutic exercise;Energy conservation;DME and/or AE instruction;Splinting;Therapeutic activities;Patient/family education;Balance training    OT Goals(Current goals can be found in the care plan section) Acute Rehab OT Goals Patient Stated Goal: to breathe better OT Goal Formulation: With patient Time For Goal Achievement: 05/15/20 Potential to Achieve Goals: Good ADL Goals Pt Will Perform Grooming: with modified independence;standing Pt Will Transfer to Toilet: with modified independence;ambulating;regular height toilet Pt Will Perform Toileting - Clothing Manipulation and hygiene: with modified independence;sitting/lateral leans  OT Frequency: Min 1X/week    AM-PAC OT "6 Clicks" Daily Activity     Outcome Measure Help from another person eating meals?: A Little Help from another person taking care of personal grooming?: A Little Help from another person toileting, which includes using toliet, bedpan, or urinal?: A Little Help from another person bathing (including washing, rinsing, drying)?: A Lot Help from another person  to put on and taking off regular upper body clothing?: A Little Help from another person to put on and taking off regular lower body clothing?: A Little 6 Click Score: 17   End of Session Equipment Utilized During Treatment: Rolling walker;Oxygen Nurse Communication: Mobility status  Activity Tolerance: Patient tolerated treatment well Patient left: in chair;with call bell/phone within reach;with chair alarm set  OT Visit Diagnosis: Unsteadiness on feet (R26.81);Muscle weakness (generalized) (M62.81)                Time: [id number] OT Time Calculation (min): 28 min Charges:  OT General Charges $OT Visit: 1 Visit OT Evaluation $OT Eval Moderate Complexity: 1 Mod OT Treatments $Self Care/Home Management : 8-22 mins  Fredirick Maudlin, OTR/L [place]

## 2020-05-01 NOTE — Progress Notes (Signed)
Patient ID: Ana Washington, female   DOB: Oct 03, 1945, 75 y.o.   MRN: [id number] [facility] PROGRESS NOTE  Ana Washington:[id number] DOB: [birth year] DOA: 04/29/2020 PCP: [name], MD  HPI/Subjective: Patient feels better than yesterday.  Still having rattling in the chest.  Some shortness of breath and cough.  Objective: Vitals:   05/01/20 0822 05/01/20 1210  BP: (!) 149/91 128/78  Pulse: 86 68  Resp: 16 16  Temp: 98.7 F (37.1 C) 98.7 F (37.1 C)  SpO2: 96% 99%    Intake/Output Summary (Last 24 hours) at 05/01/2020 1317 Last data filed at 05/01/2020 0950 Gross per 24 hour  Intake 1060 ml  Output 2000 ml  Net -940 ml   Filed Weights   04/29/20 0059 04/30/20 0500 05/01/20 0600  Weight: 50.3 kg 54.4 kg 54.8 kg    ROS: Review of Systems  Respiratory: Positive for cough and shortness of breath.   Cardiovascular: Negative for chest pain.  Gastrointestinal: Negative for abdominal pain, nausea and vomiting.  Musculoskeletal: Positive for joint pain.   Exam: Physical Exam HENT:     Head: Normocephalic.     Mouth/Throat:     Pharynx: No oropharyngeal exudate.  Eyes:     General: Lids are normal.     Conjunctiva/sclera: Conjunctivae normal.     Pupils: Pupils are equal, round, and reactive to light.  Cardiovascular:     Rate and Rhythm: Normal rate and regular rhythm.     Heart sounds: Normal heart sounds, S1 normal and S2 normal.  Pulmonary:     Breath sounds: Examination of the right-middle field reveals decreased breath sounds and rhonchi. Examination of the left-middle field reveals decreased breath sounds and rhonchi. Examination of the right-lower field reveals decreased breath sounds and rhonchi. Examination of the left-lower field reveals decreased breath sounds and rhonchi. Decreased breath sounds and rhonchi present. No wheezing or rales.  Abdominal:     Palpations: Abdomen is soft.     Tenderness: There is no abdominal tenderness.  Musculoskeletal:      Right lower leg: No swelling.     Left lower leg: No swelling.  Skin:    General: Skin is warm.     Findings: No rash.  Neurological:     Mental Status: She is alert and oriented to person, place, and time.       Data Reviewed: Basic Metabolic Panel: Recent Labs  Lab 04/29/20 0108 04/30/20 0458 05/01/20 0457  NA 138 142 140  K 3.7 4.2 4.5  CL 93* 104 102  CO2 33* 32 33*  GLUCOSE 115* 136* 124*  BUN 29* 18 15  CREATININE 1.15* 0.75 0.46  CALCIUM 8.9 8.2* 8.2*  MG 1.6*  --   --    Liver Function Tests: Recent Labs  Lab 04/29/20 0108  AST 26  ALT 13  ALKPHOS 41  BILITOT 0.7  PROT 6.6  ALBUMIN 3.8   Recent Labs  Lab 04/29/20 0108  LIPASE 42   CBC: Recent Labs  Lab 04/29/20 0108 04/29/20 0603 04/29/20 1422 04/29/20 1726 04/29/20 2338 04/30/20 0458 05/01/20 0457  WBC 21.6*  --   --   --   --  12.5* 13.3*  NEUTROABS 19.1*  --   --   --   --   --  12.0*  HGB 12.7   < > 12.0 12.1 11.5* 11.4* 11.4*  HCT 38.0   < > 36.1 37.0 35.4* 34.6* 35.0*  MCV 97.9  --   --   --   --  100.3* 101.2*  PLT 338  --   --   --   --  247 242   < > = values in this interval not displayed.   BNP (last 3 results) Recent Labs    09/08/19 0650 04/29/20 0108  BNP 101.3* 71.0     Recent Results (from the past 240 hour(s))  Resp Panel by RT-PCR (Flu A&B, Covid) Nasopharyngeal Swab     Status: None   Collection Time: 04/29/20  1:08 AM   Specimen: Nasopharyngeal Swab; Nasopharyngeal(NP) swabs in vial transport medium  Result Value Ref Range Status   SARS Coronavirus 2 by RT PCR NEGATIVE NEGATIVE Final    Comment: (NOTE) SARS-CoV-2 target nucleic acids are NOT DETECTED.  The SARS-CoV-2 RNA is generally detectable in upper respiratory specimens during the acute phase of infection. The lowest concentration of SARS-CoV-2 viral copies this assay can detect is 138 copies/mL. A negative result does not preclude SARS-Cov-2 infection and should not be used as the sole basis  for treatment or other patient management decisions. A negative result may occur with  improper specimen collection/handling, submission of specimen other than nasopharyngeal swab, presence of viral mutation(s) within the areas targeted by this assay, and inadequate number of viral copies(<138 copies/mL). A negative result must be combined with clinical observations, patient history, and epidemiological information. The expected result is Negative.  Fact Sheet for Patients:  EntrepreneurPulse.com.au  Fact Sheet for Healthcare Providers:  IncredibleEmployment.be  This test is no t yet approved or cleared by the [place] FDA and  has been authorized for detection and/or diagnosis of SARS-CoV-2 by FDA under an Emergency Use Authorization (EUA). This EUA will remain  in effect (meaning this test can be used) for the duration of the COVID-19 declaration under Section 564(b)(1) of the Act, 21 U.S.C.section 360bbb-3(b)(1), unless the authorization is terminated  or revoked sooner.       Influenza A by PCR NEGATIVE NEGATIVE Final   Influenza B by PCR NEGATIVE NEGATIVE Final    Comment: (NOTE) The Xpert Xpress SARS-CoV-2/FLU/RSV plus assay is intended as an aid in the diagnosis of influenza from Nasopharyngeal swab specimens and should not be used as a sole basis for treatment. Nasal washings and aspirates are unacceptable for Xpert Xpress SARS-CoV-2/FLU/RSV testing.  Fact Sheet for Patients: EntrepreneurPulse.com.au  Fact Sheet for Healthcare Providers: IncredibleEmployment.be  This test is not yet approved or cleared by the [place] FDA and has been authorized for detection and/or diagnosis of SARS-CoV-2 by FDA under an Emergency Use Authorization (EUA). This EUA will remain in effect (meaning this test can be used) for the duration of the COVID-19 declaration under Section 564(b)(1) of the Act, 21  U.S.C. section 360bbb-3(b)(1), unless the authorization is terminated or revoked.  Performed at [facility], [address] [place], [place] [address]   Culture, blood (Routine X 2) w Reflex to ID Panel     Status: None (Preliminary result)   Collection Time: 04/29/20  2:35 AM   Specimen: BLOOD  Result Value Ref Range Status   Specimen Description BLOOD RIGHT ANTECUBITAL  Final   Special Requests   Final    BOTTLES DRAWN AEROBIC AND ANAEROBIC Blood Culture adequate volume   Culture   Final    NO GROWTH 2 DAYS Performed at [facility] [address] [place], [place] [address]    Report Status PENDING  Incomplete  Urine Culture     Status: Abnormal   Collection Time: 04/29/20  2:35  AM   Specimen: Urine, Random  Result Value Ref Range Status   Specimen Description   Final    URINE, RANDOM Performed at [facility], [address], [place], [place] [address]    Special Requests   Final    NONE Performed at [facility], [address] [place], [place] [address]    Culture (A)  Final    <10,000 COLONIES/mL INSIGNIFICANT GROWTH Performed at [place] [address], [place], [place] [address]    Report Status 04/30/2020 FINAL  Final  Culture, blood (Routine X 2) w Reflex to ID Panel     Status: None (Preliminary result)   Collection Time: 04/29/20  3:08 AM   Specimen: BLOOD  Result Value Ref Range Status   Specimen Description BLOOD LEFT ANTECUBITAL  Final   Special Requests   Final    BOTTLES DRAWN AEROBIC AND ANAEROBIC Blood Culture adequate volume   Culture   Final    NO GROWTH 2 DAYS Performed at [facility], [address] [place], [place] [address]    Report Status PENDING  Incomplete  Acid Fast Smear (AFB)     Status: None   Collection Time: 04/29/20  2:08 PM   Specimen: Sputum  Result Value Ref Range Status   AFB Specimen Processing Concentration  Final   Acid Fast Smear Negative  Final     Comment: (NOTE) Performed At: [facility] [place], [place] [id number] Rush Farmer MD GQ:[id number]    Source (AFB) SPUTUM  Final    Comment: Performed at [facility], [address] [place], [place] [address]  Expectorated Sputum Assessment w Gram Stain, Rflx to Resp Cult     Status: None   Collection Time: 04/29/20  2:36 PM   Specimen: Sputum  Result Value Ref Range Status   Specimen Description SPUTUM  Final   Special Requests NONE  Final   Sputum evaluation   Final    THIS SPECIMEN IS ACCEPTABLE FOR SPUTUM CULTURE Performed at [facility], [address], [place], [place] [address]    Report Status 04/29/2020 FINAL  Final  Culture, Respiratory w Gram Stain     Status: None   Collection Time: 04/29/20  2:36 PM   Specimen: SPU  Result Value Ref Range Status   Specimen Description   Final    SPUTUM Performed at [facility], [address] [place], [place] [address]    Special Requests   Final    NONE Reflexed from [id number] Performed at [facility], [address] [place], [place] [address]    Gram Stain   Final    NO WBC SEEN FEW GRAM POSITIVE COCCI FEW GRAM POSITIVE RODS    Culture   Final    RARE Normal respiratory flora-no Staph aureus or Pseudomonas seen Performed at [facility], [place] [address], [place],  [address]    Report Status 05/01/2020 FINAL  Final     Studies: ECHOCARDIOGRAM COMPLETE  Result Date: 04/30/2020    ECHOCARDIOGRAM REPORT   Patient Name:   Saren SHAHIDAH NESBITT Date of Exam: 04/30/2020 Medical Rec #:  [id number]       Height:       65.0 in Accession #:    [id number]      Weight:       120.0 lb Date of Birth:  [birth year]       BSA:  1.592 m Patient Age:    20 years        BP:           130/76 mmHg Patient Gender: F               HR:           90 bpm. Exam Location:  [facility] Procedure: 2D Echo, Cardiac Doppler, Color Doppler and Strain Analysis Indications:      Pulmonary hypertension I27.2  History:         Patient has no prior history of Echocardiogram examinations.                  Previous Myocardial Infarction; Arrythmias:Atrial Fibrillation.  Sonographer:     Sherrie Sport RDCS (AE) Referring Phys:  [id number] Loletha Grayer Diagnosing Phys: Isaias Cowman MD  Sonographer Comments: Global longitudinal strain was attempted. IMPRESSIONS  1. Left ventricular ejection fraction, by estimation, is 50 to 55%. The left ventricle has low normal function. The left ventricle has no regional wall motion abnormalities. Left ventricular diastolic parameters are consistent with Grade I diastolic dysfunction (impaired relaxation).  2. Right ventricular systolic function is normal. The right ventricular size is normal.  3. The mitral valve is normal in structure. Mild mitral valve regurgitation. No evidence of mitral stenosis.  4. The aortic valve is normal in structure. Aortic valve regurgitation is mild. No aortic stenosis is present.  5. The inferior vena cava is normal in size with greater than 50% respiratory variability, suggesting right atrial pressure of 3 mmHg. FINDINGS  Left Ventricle: Left ventricular ejection fraction, by estimation, is 50 to 55%. The left ventricle has low normal function. The left ventricle has no regional wall motion abnormalities. The left ventricular internal cavity size was normal in size. There is no left ventricular hypertrophy. Left ventricular diastolic parameters are consistent with Grade I diastolic dysfunction (impaired relaxation). Right Ventricle: The right ventricular size is normal. No increase in right ventricular wall thickness. Right ventricular systolic function is normal. Left Atrium: Left atrial size was normal in size. Right Atrium: Right atrial size was normal in size. Pericardium: There is no evidence of pericardial effusion. Mitral Valve: The mitral valve is normal in structure. Mild mitral valve regurgitation. No evidence of  mitral valve stenosis. Tricuspid Valve: The tricuspid valve is normal in structure. Tricuspid valve regurgitation is mild . No evidence of tricuspid stenosis. Aortic Valve: The aortic valve is normal in structure. Aortic valve regurgitation is mild. No aortic stenosis is present. Aortic valve mean gradient measures 4.0 mmHg. Aortic valve peak gradient measures 7.7 mmHg. Aortic valve area, by VTI measures 1.64 cm. Pulmonic Valve: The pulmonic valve was normal in structure. Pulmonic valve regurgitation is not visualized. No evidence of pulmonic stenosis. Aorta: The aortic root is normal in size and structure. Venous: The inferior vena cava is normal in size with greater than 50% respiratory variability, suggesting right atrial pressure of 3 mmHg. IAS/Shunts: No atrial level shunt detected by color flow Doppler.  LEFT VENTRICLE PLAX 2D LVIDd:         3.25 cm  Diastology LVIDs:         2.42 cm  LV e' medial:    11.30 cm/s LV PW:         0.76 cm  LV E/e' medial:  7.0 LV IVS:        0.86 cm  LV e' lateral:   6.74 cm/s LVOT diam:     2.00 cm  LV E/e' lateral: 11.8 LV SV:         47 LV SV Index:   29 LVOT Area:     3.14 cm  RIGHT VENTRICLE RV Basal diam:  3.14 cm RV S prime:     15.00 cm/s TAPSE (M-mode): 4.2 cm LEFT ATRIUM             Index       RIGHT ATRIUM           Index LA diam:        2.80 cm 1.76 cm/m  RA Area:     13.50 cm LA Vol (A2C):   50.3 ml 31.59 ml/m RA Volume:   29.70 ml  18.65 ml/m LA Vol (A4C):   65.2 ml 40.95 ml/m LA Biplane Vol: 57.7 ml 36.24 ml/m  AORTIC VALVE                   PULMONIC VALVE AV Area (Vmax):    1.52 cm    PV Vmax:        0.72 m/s AV Area (Vmean):   1.45 cm    PV Peak grad:   2.1 mmHg AV Area (VTI):     1.64 cm    RVOT Peak grad: 3 mmHg AV Vmax:           139.00 cm/s AV Vmean:          97.400 cm/s AV VTI:            0.285 m AV Peak Grad:      7.7 mmHg AV Mean Grad:      4.0 mmHg LVOT Vmax:         67.20 cm/s LVOT Vmean:        45.000 cm/s LVOT VTI:          0.149 m LVOT/AV  VTI ratio: 0.52  AORTA Ao Root diam: 2.70 cm MITRAL VALVE               TRICUSPID VALVE MV Area (PHT): 5.54 cm    TR Peak grad:   36.2 mmHg MV Decel Time: 137 msec    TR Vmax:        301.00 cm/s MV E velocity: 79.30 cm/s MV A velocity: 97.70 cm/s  SHUNTS MV E/A ratio:  0.81        Systemic VTI:  0.15 m                            Systemic Diam: 2.00 cm Isaias Cowman MD Electronically signed by Isaias Cowman MD Signature Date/Time: 04/30/2020/1:38:44 PM    Final     Scheduled Meds: . acetylcysteine  4 mL Nebulization Daily  . acidophilus  1 capsule Oral Daily  . diltiazem  30 mg Oral Q12H  . guaiFENesin  600 mg Oral BID  . ipratropium-albuterol  3 mL Nebulization TID  . LORazepam  0.5 mg Oral QHS  . methylPREDNISolone (SOLU-MEDROL) injection  20 mg Intravenous Daily  . metoprolol succinate  100 mg Oral Daily  . pantoprazole  40 mg Oral Daily  . polyethylene glycol  17 g Oral Daily  . senna  2 tablet Oral QHS   Continuous Infusions: . amikacin (AMIKIN) IV 800 mg (05/01/20 0920)  . cefTRIAXone (ROCEPHIN)  IV 2 g (04/30/20 1701)    Assessment/Plan:  1. Multifocal bronchiectasis with exacerbation,  chronic hypoxic respiratory failure on chronic oxygen.  Patient on amikacin  and Rocephin currently.  COVID-19 testing negative.  Following up sputum culture so far gram-positive cocci.  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  Holding both of those medications while in the hospital and treating with others.  Solu-Medrol started yesterday. 2. Hemoptysis.  This has resolved.  Patient wants to hold Eliquis until follows up with pulmonary as outpatient. 3. Acute kidney injury.  Creatinine 1.15 on presentation and down to 0.46 today 4. Essential hypertension on Toprol and Cardizem CD. 5. Paroxysmal atrial fibrillation on Toprol and Cardizem CD 6. Anxiety on Ativan 7. GERD continue Protonix 8. Constipation improved with MiraLAX and senna 9. Right heel stage I decubiti, present on  admission.  Heel foam pad and pillow on the right calf to prevent worsening. 10. PT recommending home health PT        Code Status:     Code Status Orders  (From admission, onward)         Start     Ordered   04/29/20 0549  Do not attempt resuscitation (DNR)  Continuous       Question Answer Comment  In the event of cardiac or respiratory ARREST Do not call a "code blue"   In the event of cardiac or respiratory ARREST Do not perform Intubation, CPR, defibrillation or ACLS   In the event of cardiac or respiratory ARREST Use medication by any route, position, wound care, and other measures to relive pain and suffering. May use oxygen, suction and manual treatment of airway obstruction as needed for comfort.      04/29/20 0551        Code Status History    Date Active Date Inactive Code Status Order ID Comments User Context   09/08/2019 0853 09/10/2019 2215 DNR [id number]  Karmen Bongo, MD ED   09/27/2018 1816 10/11/2018 1818 DNR [id number]  Demetrios Loll, MD ED   04/19/2018 1147 04/19/2018 1549 Full Code [id number]  Hessie Knows, MD Inpatient   04/08/2018 1817 04/14/2018 1637 DNR [id number]  Tyler Pita, MD Inpatient   04/08/2018 0752 04/08/2018 1817 Full Code [id number]  Harrie Foreman, MD Inpatient   04/05/2018 1455 04/05/2018 1831 Full Code [id number]  Hessie Knows, MD Inpatient   01/05/2018 1836 01/06/2018 1504 Full Code [id number]  Hessie Knows, MD Inpatient   12/18/2017 1741 12/18/2017 2125 Full Code [id number]  Hessie Knows, MD Inpatient   11/29/2017 1555 11/29/2017 2013 Full Code [id number]  Hessie Knows, MD Inpatient   07/31/2017 1054 08/03/2017 1524 DNR [id number]  Demetrios Loll, MD Inpatient   07/31/2017 0057 07/31/2017 1054 Full Code [id number]  Lance Coon, MD Inpatient   07/05/2016 0931 07/05/2016 1311 Full Code [id number]  Hessie Knows, MD Inpatient   Advance Care Planning Activity    Advance Directive Documentation   Flowsheet Row Most Recent Value  Type of Advance Directive Living  will, Healthcare Power of Attorney  Pre-existing out of facility DNR order (yellow form or pink MOST form) --  "MOST" Form in Place? --     Family Communication: Spoke with husband on the phone at [phone] Disposition Plan: Status is: Inpatient  Dispo: The patient is from: Home              Anticipated d/c is to: Home with home health.  The patient would like to go home on 05/02/2020.  Will reassess tomorrow              Patient currently requiring IV antibiotics and IV steroids to improve air entry and  treat infection.   Difficult to place patient.  No.  Time spent: 28 minutes, case discussed with pulmonary  Loletha Grayer  [facility]

## 2020-05-02 LAB — CREATININE, SERUM
Creatinine, Ser: 0.63 mg/dL (ref 0.44–1.00)
GFR, Estimated: >60 mL/min (ref >60)

## 2020-05-02 LAB — MAGNESIUM: Magnesium: 2.1 mg/dL (ref 1.7–2.4)

## 2020-05-02 LAB — FUNGITELL, SERUM: Fungitell Result: 33 pg/mL (ref <80)

## 2020-05-02 MED ORDER — POLYETHYLENE GLYCOL 3350 17 G PO PACK: 17.0000 g | PACK | Freq: Every day | ORAL | 0 refills | Status: AC | PRN

## 2020-05-02 MED ORDER — AMLODIPINE BESYLATE 5 MG PO TABS
5.0000 mg | ORAL_TABLET | Freq: Every day | ORAL | Status: DC
  Administered 2020-05-02: 5 mg via ORAL
  Filled 2020-05-02: qty 1

## 2020-05-02 MED ORDER — SENNA 8.6 MG PO TABS: 2.0000 | ORAL_TABLET | Freq: Every day | ORAL | 0 refills | Status: AC

## 2020-05-02 NOTE — TOC Transition Note (Signed)
Transition of Care [facility]) - CM/SW Discharge Note   Patient Details  Name: Ana Washington MRN: [id number] Date of Birth: 07-Sep-1945  Transition of Care [facility] CM/SW Contact:  Izola Price, RN Phone Number: 05/02/2020, 12:05 PM   Clinical Narrative:   Patient being discharge Home with [place] arranged via [facility] at [phone] and start of service will be Tuesday or Wednesday. This was okay with patient as sister will be staying with her and she felt this was safe. Spouse is at home as well. Patient has a Rw/Rollator at home so no DME needed. Cautioned patient about having supervision for mobility per PT recommendations, and she said yes, her sister would be there for her. Communicated with Unit RN and provider. Patient has home oxygen already set up for chronic oxygen needs PTA. Patient confirmed transport oxygen via spouse. Simmie Davies RN CM    Final next level of care: [place] Barriers to Discharge: Barriers Resolved   Patient Goals and CMS Choice        Discharge Placement                       Discharge Plan and Services                DME Arranged: N/A (Patient has Diplomatic Services operational officer at home per patient.) DME Agency: NA       HH Arranged: PT [place] Agency: [facility] [employer] Date [place]: 05/02/20 Time [place]: 1204 Representative spoke with at [place]: Drue Novel [phone]  Social Determinants of Health ([place]) Interventions     Readmission Risk Interventions Readmission Risk Prevention Plan 01/06/2018  Transportation Screening Complete  PCP or Specialist Appt within 5-7 Days Complete  Home Care Screening Complete  Medication Review (RN CM) Complete  Some recent data might be hidden

## 2020-05-02 NOTE — Discharge Instructions (Signed)
Bronchiectasis  Bronchiectasis is a condition in which the airways in the lungs (bronchi) are damaged and widened. The condition makes it hard for the lungs to get rid of mucus, and it causes mucus to gather in the bronchi. This condition often leads to lung infections, which can make the condition worse. What are the causes? You can be born with this condition or you can develop it later in life. Common causes of this condition include:  Cystic fibrosis.  Repeated lung infections, such as pneumonia or tuberculosis.  An object or other blockage in the lungs.  Breathing in fluid, food, or other objects (aspiration).  A problem with the immune system and lung structure that is present at birth (congenital). Sometimes the cause is not known. What are the signs or symptoms? Common symptoms of this condition include:  A daily cough that brings up mucus and lasts for more than 3 weeks.  Lung infections that happen often.  Shortness of breath and wheezing.  Weakness and fatigue. How is this diagnosed? This condition is diagnosed with tests, such as:  Chest X-rays or CT scans. These are done to check for changes in the lungs.  Breathing tests. These are done to check how well your lungs are working.  A test of a sample of your saliva (sputum culture). This test is done to check for infection.  Blood tests and other tests. These are done to check for related diseases or causes. How is this treated? Treatment for this condition depends on the severity of the illness and its cause. Treatment may include:  Medicines that loosen mucus so it can be coughed up (mucolytics).  Medicines that relax the muscles of the bronchi (bronchodilators).  Antibiotic medicines to prevent or treat infection.  Physical therapy to help clear mucus from the lungs. Techniques may include: ? Postural drainage. This is when you sit or lie in certain positions so that mucus can drain by gravity. ? Chest  percussion. This involves tapping the chest or back with a cupped hand. ? Chest vibration. For this therapy, a hand or special equipment vibrates your chest and back.  Surgery to remove the affected part of the lung. This may be done in severe cases. Follow these instructions at home: Medicines  Take over-the-counter and prescription medicines only as told by your health care provider.  If you were prescribed an antibiotic medicine, take it as told by your health care provider. Do not stop taking the antibiotic even if you start to feel better.  Avoid taking sedatives and antihistamines unless your health care provider tells you to take them. These medicines tend to thicken the mucus in the lungs. Managing symptoms  Perform breathing exercises or techniques to clear your lungs as told by your health care provider.  Consider using a cold steam vaporizer or humidifier in your room or home to help loosen secretions.  If you have a cough that gets worse at night, try sleeping in a semi-upright position. General instructions  Get plenty of rest.  Drink enough fluid to keep your urine clear or pale yellow.  Stay inside when pollution and ozone levels are high.  Stay up to date with vaccinations and immunizations.  Avoid cigarette smoke and other lung irritants.  Do not use any products that contain nicotine or tobacco, such as cigarettes and e-cigarettes. If you need help quitting, ask your health care provider.  Keep all follow-up visits as told by your health care provider. This is important. Contact  a health care provider if:  You cough up more sputum than before and the sputum is yellow or green in color.  You have a fever.  You cannot control your cough and are losing sleep. Get help right away if:  You cough up blood.  You have chest pain.  You have increasing shortness of breath.  You have pain that gets worse or is not controlled with medicines.  You have a fever  and your symptoms suddenly get worse. Summary  Bronchiectasis is a condition in which the airways in the lungs (bronchi) are damaged and widened. The condition makes it hard for the lungs to get rid of mucus, and it causes mucus to gather in the bronchi.  Treatment usually includes therapy to help clear mucus from the lungs.  Avoid cigarette smoke and other lung irritants.  Stay up to date with vaccinations and immunizations. This information is not intended to replace advice given to you by your health care provider. Make sure you discuss any questions you have with your health care provider. Document Revised: 09/19/2019 Document Reviewed: 09/19/2019 Elsevier Patient Education  2021 [employer].

## 2020-05-02 NOTE — Plan of Care (Signed)

## 2020-05-02 NOTE — Plan of Care (Signed)

## 2020-05-02 NOTE — Discharge Summary (Signed)
[facility] NAME: Ana [place]    MR#:  [id number]  DATE OF BIRTH:  08-01-1945  DATE OF ADMISSION:  04/29/2020 ADMITTING PHYSICIAN: Christel Mormon, MD  DATE OF DISCHARGE: 05/02/2020  1:22 PM  PRIMARY CARE PHYSICIAN: [name], MD    ADMISSION DIAGNOSIS:  Bronchiectasis with acute exacerbation ([facility]) [J47.1] Hemoptysis [R04.2] Bronchiectasis with (acute) exacerbation ([facility]) [J47.1]  DISCHARGE DIAGNOSIS:  Active Problems:   AF (paroxysmal atrial fibrillation) (HCC)   Bronchiectasis with (acute) exacerbation ([facility])   Anxiety   Constipation   Pressure injury of right heel, stage 1   SECONDARY DIAGNOSIS:   Past Medical History:  Diagnosis Date  . Arthritis   . Atrial fibrillation ([facility])   . Bronchiectasis ([facility])   . CAD (coronary artery disease) 07/30/2017  . Dumping syndrome   . 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 Family history of adverse reaction to anesthesia    sister [name]  . GERD (gastroesophageal reflux disease)   . Headache    MIGRAINES  . Myocardial infarction ([facility]) 2007   Non-STEMI  . [name] (postoperative nausea and vomiting)   . Psoriasis   . PUD (peptic ulcer disease)     HOSPITAL COURSE:   1.  Multifocal bronchiectasis with exacerbation.  The patient does have chronic hypoxic respiratory failure on chronic oxygen 2 L.  Patient was given amikacin and Rocephin here in the hospital.  The patient has a new prescription for Levaquin at home.  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  Can go back on Zithromax upon going out of the hospital.  The patient was started on Solu-Medrol here and has a prescription for prednisone 20 mg for a few days and down to chronic 10 mg after that.  Patient seen by Dr. Lanney Gins here in the hospital.  Patient did have some yeast on sputum culture and in speaking with pulmonary no need to treat that. 2.  Hemoptysis.  This has resolved.   The patient is interested in holding Eliquis until follows up with pulmonary as outpatient. 3.  Acute kidney injury on presentation.  Creatinine was 1.15.  Did improve with IV fluids down to 0.46.  Upon discharge creatinine 0.63. 4.  Essential hypertension on Toprol and Cardizem CD and also Norvasc. 5.  Atrial fibrillation on Toprol and Cardizem CD.  Stroke risk higher being off anticoagulation.  Patient interested in holding Eliquis until follows up with pulmonary as outpatient. 6.  Anxiety.  Continue Ativan 7.  GERD.  Continue Protonix 8.  Constipation has resolved with MiraLAX and senna will make as needed upon going home 9.  Right heel stage I decubiti present on admission.  Heel foam pads and pillows under the right calf to prevent worsening. 10.  Weakness.  Physical therapy recommended home with home health  DISCHARGE CONDITIONS:   Satisfactory  CONSULTS OBTAINED:  Treatment Team:  Ottie Glazier, MD  DRUG ALLERGIES:   Allergies  Allergen Reactions  . Codeine Nausea And Vomiting  . Sulfa Antibiotics Diarrhea  . Penicillins Rash    Has patient had a PCN reaction causing immediate rash, facial/tongue/throat swelling, SOB or lightheadedness with hypotension: Unknown Has patient had a PCN reaction causing severe rash involving mucus membranes or skin necrosis: Unknown Has patient had a PCN reaction that required hospitalization: Unknown Has patient had a PCN reaction occurring within the last 10 years: No If all of the above answers are "  NO", then may proceed with Cephalosporin use.     DISCHARGE MEDICATIONS:   Allergies as of 05/02/2020      Reactions   Codeine Nausea And Vomiting   Sulfa Antibiotics Diarrhea   Penicillins Rash   Has patient had a PCN reaction causing immediate rash, facial/tongue/throat swelling, SOB or lightheadedness with hypotension: Unknown Has patient had a PCN reaction causing severe rash involving mucus membranes or skin necrosis: Unknown Has  patient had a PCN reaction that required hospitalization: Unknown Has patient had a PCN reaction occurring within the last 10 years: No If all of the above answers are "NO", then may proceed with Cephalosporin use.      Medication List    TAKE these medications   acetaminophen 500 MG tablet Commonly known as: TYLENOL Take 1,000 mg by mouth every 6 (six) hours as needed for mild pain or headache.   acetylcysteine 20 % nebulizer solution Commonly known as: MUCOMYST Take 4 mLs by nebulization daily.   Align 4 MG Caps Take 4 mg by mouth daily.   amLODipine 5 MG tablet Commonly known as: NORVASC Take 5 mg by mouth daily.   apixaban 5 MG Tabs tablet Commonly known as: ELIQUIS Take 5 mg by mouth 2 (two) times daily.   azithromycin 250 MG tablet Commonly known as: ZITHROMAX Take 250 mg by mouth daily.   benzonatate 200 MG capsule Commonly known as: TESSALON Take 200 mg by mouth 3 (three) times daily as needed.   diltiazem 120 MG 24 hr capsule Commonly known as: CARDIZEM CD Take 120 mg by mouth daily.   ferrous sulfate 325 (65 FE) MG EC tablet Take 325 mg by mouth daily.   furosemide 20 MG tablet Commonly known as: LASIX Take 20 mg by mouth daily as needed for edema.   gabapentin 300 MG capsule Commonly known as: NEURONTIN Take 300 mg by mouth 4 (four) times daily.   glycopyrrolate 1 MG tablet Commonly known as: ROBINUL Take 1 tablet by mouth 3 (three) times daily.   levalbuterol 0.31 MG/3ML nebulizer solution Commonly known as: XOPENEX Inhale 3 mLs into the lungs every 6 (six) hours as needed.   levofloxacin 500 MG tablet Commonly known as: LEVAQUIN Take 1 tablet by mouth daily. For 7 days   LORazepam 0.5 MG tablet Commonly known as: ATIVAN Take 1 tablet (0.5 mg total) by mouth at bedtime.   losartan 100 MG tablet Commonly known as: COZAAR Take 100 mg by mouth daily.   metoprolol succinate 100 MG 24 hr tablet Commonly known as: TOPROL-XL Take 1 tablet  (100 mg total) by mouth daily.   NAC 600 MG Caps Generic drug: Acetylcysteine Take 600 mg by mouth daily after breakfast.   omeprazole 20 MG capsule Commonly known as: PRILOSEC Take 20 mg by mouth daily.   polyethylene glycol 17 g packet Commonly known as: MIRALAX / GLYCOLAX Take 17 g by mouth daily as needed for severe constipation.   potassium chloride 10 MEQ tablet Commonly known as: KLOR-CON Take 10 mEq by mouth daily.   predniSONE 20 MG tablet Commonly known as: DELTASONE Take 20 mg by mouth daily.   senna 8.6 MG Tabs tablet Commonly known as: SENOKOT Take 2 tablets (17.2 mg total) by mouth at bedtime.   tobramycin (PF) 300 MG/5ML nebulizer solution Commonly known as: TOBI Take 5 mLs by nebulization 2 (two) times daily.   vitamin C 1000 MG tablet Take 1,000 mg by mouth daily.  DISCHARGE INSTRUCTIONS:   Follow-up PMD 5 days Follow-up pulmonary 1 week  If you experience worsening of your admission symptoms, develop shortness of breath, life threatening emergency, suicidal or homicidal thoughts you must seek medical attention immediately by calling 911 or calling your MD immediately  if symptoms less severe.  You Must read complete instructions/literature along with all the possible adverse reactions/side effects for all the Medicines you take and that have been prescribed to you. Take any new Medicines after you have completely understood and accept all the possible adverse reactions/side effects.   Please note  You were cared for by a hospitalist during your hospital stay. If you have any questions about your discharge medications or the care you received while you were in the hospital after you are discharged, you can call the unit and asked to speak with the hospitalist on call if the hospitalist that took care of you is not available. Once you are discharged, your primary care physician will handle any further medical issues. Please note that NO REFILLS  for any discharge medications will be authorized once you are discharged, as it is imperative that you return to your primary care physician (or establish a relationship with a primary care physician if you do not have one) for your aftercare needs so that they can reassess your need for medications and monitor your lab values.    Today   CHIEF COMPLAINT:   Chief Complaint  Patient presents with  . Hemoptysis    HISTORY OF PRESENT ILLNESS:  Hattie Pine  is a 75 y.o. female came in with hemoptysis   VITAL SIGNS:  Blood pressure (!) 122/92, pulse 75, temperature 98.2 F (36.8 C), temperature source Oral, resp. rate 16, height _0  (1.651 m), weight 52.9 kg, SpO2 99 %.  I/O:    Intake/Output Summary (Last 24 hours) at 05/02/2020 1359 Last data filed at 05/02/2020 0900 Gross per 24 hour  Intake 2776.53 ml  Output 1400 ml  Net 1376.53 ml    PHYSICAL EXAMINATION:  GENERAL:  75 y.o.-year-old patient lying in the bed with no acute distress.  EYES: Pupils equal, round, reactive to light and accommodation. No scleral icterus. HEENT: Head atraumatic, normocephalic. Oropharynx and nasopharynx clear.  LUNGS: Decreased breath sounds bilaterally, no wheezing, rales,rhonchi or crepitation. No use of accessory muscles of respiration.  CARDIOVASCULAR: S1, S2 normal. No murmurs, rubs, or gallops.  ABDOMEN: Soft, non-tender, non-distended.  EXTREMITIES: No pedal edema.  NEUROLOGIC: Cranial nerves II through XII are intact. Muscle strength 5/5 in all extremities. Sensation intact. Gait not checked.  PSYCHIATRIC: The patient is alert and oriented x 3.  SKIN: No obvious rash, lesion, or ulcer.   DATA REVIEW:   CBC Recent Labs  Lab 05/01/20 0457  WBC 13.3*  HGB 11.4*  HCT 35.0*  PLT 242    Chemistries  Recent Labs  Lab 04/29/20 0108 04/30/20 0458 05/01/20 0457 05/02/20 0533  NA 138   < > 140  --   K 3.7   < > 4.5  --   CL 93*   < > 102  --   CO2 33*   < > 33*  --   GLUCOSE  115*   < > 124*  --   BUN 29*   < > 15  --   CREATININE 1.15*   < > 0.46 0.63  CALCIUM 8.9   < > 8.2*  --   MG 1.6*  --   --  2.1  AST  26  --   --   --   ALT 13  --   --   --   ALKPHOS 41  --   --   --   BILITOT 0.7  --   --   --    < > = values in this interval not displayed.    Microbiology Results  Results for orders placed or performed during the hospital encounter of 04/29/20  Resp Panel by RT-PCR (Flu A&B, Covid) Nasopharyngeal Swab     Status: None   Collection Time: 04/29/20  1:08 AM   Specimen: Nasopharyngeal Swab; Nasopharyngeal(NP) swabs in vial transport medium  Result Value Ref Range Status   SARS Coronavirus 2 by RT PCR NEGATIVE NEGATIVE Final    Comment: (NOTE) SARS-CoV-2 target nucleic acids are NOT DETECTED.  The SARS-CoV-2 RNA is generally detectable in upper respiratory specimens during the acute phase of infection. The lowest concentration of SARS-CoV-2 viral copies this assay can detect is 138 copies/mL. A negative result does not preclude SARS-Cov-2 infection and should not be used as the sole basis for treatment or other patient management decisions. A negative result may occur with  improper specimen collection/handling, submission of specimen other than nasopharyngeal swab, presence of viral mutation(s) within the areas targeted by this assay, and inadequate number of viral copies(<138 copies/mL). A negative result must be combined with clinical observations, patient history, and epidemiological information. The expected result is Negative.  Fact Sheet for Patients:  EntrepreneurPulse.com.au  Fact Sheet for Healthcare Providers:  IncredibleEmployment.be  This test is no t yet approved or cleared by the [place] FDA and  has been authorized for detection and/or diagnosis of SARS-CoV-2 by FDA under an Emergency Use Authorization (EUA). This EUA will remain  in effect (meaning this test can be used) for the  duration of the COVID-19 declaration under Section 564(b)(1) of the Act, 21 U.S.C.section 360bbb-3(b)(1), unless the authorization is terminated  or revoked sooner.       Influenza A by PCR NEGATIVE NEGATIVE Final   Influenza B by PCR NEGATIVE NEGATIVE Final    Comment: (NOTE) The Xpert Xpress SARS-CoV-2/FLU/RSV plus assay is intended as an aid in the diagnosis of influenza from Nasopharyngeal swab specimens and should not be used as a sole basis for treatment. Nasal washings and aspirates are unacceptable for Xpert Xpress SARS-CoV-2/FLU/RSV testing.  Fact Sheet for Patients: EntrepreneurPulse.com.au  Fact Sheet for Healthcare Providers: IncredibleEmployment.be  This test is not yet approved or cleared by the [place] FDA and has been authorized for detection and/or diagnosis of SARS-CoV-2 by FDA under an Emergency Use Authorization (EUA). This EUA will remain in effect (meaning this test can be used) for the duration of the COVID-19 declaration under Section 564(b)(1) of the Act, 21 U.S.C. section 360bbb-3(b)(1), unless the authorization is terminated or revoked.  Performed at [facility], [address] [place], [place] [address]   Culture, blood (Routine X 2) w Reflex to ID Panel     Status: None (Preliminary result)   Collection Time: 04/29/20  2:35 AM   Specimen: BLOOD  Result Value Ref Range Status   Specimen Description BLOOD RIGHT ANTECUBITAL  Final   Special Requests   Final    BOTTLES DRAWN AEROBIC AND ANAEROBIC Blood Culture adequate volume   Culture   Final    NO GROWTH 3 DAYS Performed at [facility], [address], [place], [place] [address]    Report Status PENDING  Incomplete  Urine  Culture     Status: Abnormal   Collection Time: 04/29/20  2:35 AM   Specimen: Urine, Random  Result Value Ref Range Status   Specimen Description   Final    URINE, RANDOM Performed at [facility],  [address], [place], [place] [address]    Special Requests   Final    NONE Performed at [facility], [address] [place], [place] [address]    Culture (A)  Final    <10,000 COLONIES/mL INSIGNIFICANT GROWTH Performed at [facility] [address] [place], [place] [address]    Report Status 04/30/2020 FINAL  Final  Culture, blood (Routine X 2) w Reflex to ID Panel     Status: None (Preliminary result)   Collection Time: 04/29/20  3:08 AM   Specimen: BLOOD  Result Value Ref Range Status   Specimen Description BLOOD LEFT ANTECUBITAL  Final   Special Requests   Final    BOTTLES DRAWN AEROBIC AND ANAEROBIC Blood Culture adequate volume   Culture   Final    NO GROWTH 3 DAYS Performed at [facility], [address] [place], [place] [address]    Report Status PENDING  Incomplete  Acid Fast Smear (AFB)     Status: None   Collection Time: 04/29/20  2:08 PM   Specimen: Sputum  Result Value Ref Range Status   AFB Specimen Processing Concentration  Final   Acid Fast Smear Negative  Final    Comment: (NOTE) Performed At: [facility] [place], [place] [id number] Rush Farmer MD JI:[id number]    Source (AFB) SPUTUM  Final    Comment: Performed at [facility], [address] [place], [place] [address]  Culture, fungus without smear     Status: None (Preliminary result)   Collection Time: 04/29/20  2:36 PM   Specimen: SPU; Other  Result Value Ref Range Status   Specimen Description   Final    SPUTUM Performed at [facility], [address], [place], [place] [address]    Special Requests   Final    NONE Performed at [facility], [address] [place], [place] [address]    Culture   Final    YEAST IDENTIFICATION TO FOLLOW Performed at [facility], [place] [address], [place], [place] [address]    Report Status PENDING  Incomplete  Expectorated Sputum Assessment w Gram Stain, Rflx  to Resp Cult     Status: None   Collection Time: 04/29/20  2:36 PM   Specimen: Sputum  Result Value Ref Range Status   Specimen Description SPUTUM  Final   Special Requests NONE  Final   Sputum evaluation   Final    THIS SPECIMEN IS ACCEPTABLE FOR SPUTUM CULTURE Performed at [facility], [address], [place], [place] [address]    Report Status 04/29/2020 FINAL  Final  Culture, Respiratory w Gram Stain     Status: None   Collection Time: 04/29/20  2:36 PM   Specimen: SPU  Result Value Ref Range Status   Specimen Description   Final    SPUTUM Performed at [facility], [address], [place], [place] [address]    Special Requests   Final    NONE Reflexed from [id number] Performed at [facility], [address] [place], [place] [address]    Gram Stain   Final    NO WBC SEEN FEW GRAM POSITIVE COCCI FEW Lonell Grandchild  POSITIVE RODS    Culture   Final    RARE Normal respiratory flora-no Staph aureus or Pseudomonas seen Performed at [facility], [place] [address] [place], [place] [address]    Report Status 05/01/2020 FINAL  Final     Management plans discussed with the patient, and she is agreement.  I did speak with the patient's husband yesterday afternoon about potential discharge today.  I called them today and left a message.  CODE STATUS:     Code Status Orders  (From admission, onward)         Start     Ordered   04/29/20 0549  Do not attempt resuscitation (DNR)  Continuous       Question Answer Comment  In the event of cardiac or respiratory ARREST Do not call a "code blue"   In the event of cardiac or respiratory ARREST Do not perform Intubation, CPR, defibrillation or ACLS   In the event of cardiac or respiratory ARREST Use medication by any route, position, wound care, and other measures to relive pain and suffering. May use oxygen, suction and manual treatment of airway obstruction as needed for comfort.      04/29/20 0551         Code Status History    Date Active Date Inactive Code Status Order ID Comments User Context   09/08/2019 0853 09/10/2019 2215 DNR [id number]  Karmen Bongo, MD ED   09/27/2018 1816 10/11/2018 1818 DNR [id number]  Demetrios Loll, MD ED   04/19/2018 1147 04/19/2018 1549 Full Code [id number]  Hessie Knows, MD Inpatient   04/08/2018 1817 04/14/2018 1637 DNR [id number]  Tyler Pita, MD Inpatient   04/08/2018 0752 04/08/2018 1817 Full Code [id number]  Harrie Foreman, MD Inpatient   04/05/2018 1455 04/05/2018 1831 Full Code [id number]  Hessie Knows, MD Inpatient   01/05/2018 1836 01/06/2018 1504 Full Code [id number]  Hessie Knows, MD Inpatient   12/18/2017 1741 12/18/2017 2125 Full Code [id number]  Hessie Knows, MD Inpatient   11/29/2017 1555 11/29/2017 2013 Full Code [id number]  Hessie Knows, MD Inpatient   07/31/2017 1054 08/03/2017 1524 DNR [id number]  Demetrios Loll, MD Inpatient   07/31/2017 0057 07/31/2017 1054 Full Code [id number]  Lance Coon, MD Inpatient   07/05/2016 0931 07/05/2016 1311 Full Code [id number]  Hessie Knows, MD Inpatient   Advance Care Planning Activity    Advance Directive Documentation   Flowsheet Row Most Recent Value  Type of Advance Directive Living will, Healthcare Power of Attorney  Pre-existing out of facility DNR order (yellow form or pink MOST form) --  "MOST" Form in Place? --      TOTAL TIME TAKING CARE OF THIS PATIENT: 32 minutes.    Loletha Grayer M.D on 05/02/2020 at 1:59 PM  Between 7am to 6pm - Pager - [phone]  After 6pm go to [email or web address] - password [email or web address]  [facility]  CC: Primary care physician; [name], MD

## 2020-05-04 LAB — CULTURE, BLOOD (ROUTINE X 2)
Culture: NO GROWTH
Culture: NO GROWTH
Special Requests: ADEQUATE
Special Requests: ADEQUATE

## 2020-05-18 ENCOUNTER — Other Ambulatory Visit: Payer: Self-pay

## 2020-05-18 ENCOUNTER — Encounter: Payer: Self-pay | Admitting: Podiatry

## 2020-05-18 ENCOUNTER — Ambulatory Visit: Payer: Medicare HMO | Admitting: Podiatry

## 2020-05-18 DIAGNOSIS — I739 Peripheral vascular disease, unspecified: Secondary | ICD-10-CM

## 2020-05-18 DIAGNOSIS — L600 Ingrowing nail: Secondary | ICD-10-CM

## 2020-05-18 NOTE — Patient Instructions (Signed)
[facility] [place],  [place]  [address]  Main: [phone]

## 2020-05-21 LAB — CULTURE, FUNGUS WITHOUT SMEAR

## 2020-05-21 NOTE — Progress Notes (Signed)
  Subjective:  Patient ID: Ana Washington, female    DOB: Jul 23, 1945,  MRN: [id number]  Chief Complaint  Patient presents with  . Nail Problem    Patient presents today for ingrown toenail right hallux medial border x 2-3 days.  She says both corners of her nail are tender to touch    75 y.o. female presents with the above complaint. History confirmed with patient.   Objective:  Physical Exam: warm, good capillary refill, DP reduced bilateral, no trophic changes or ulcerative lesions, normal sensory exam and PT reduced bilateral.  Mild ingrowing right hallux nail border, no paronychia Assessment:   1. Peripheral vascular disease ([place])   2. Ingrowing right great toenail      Plan:  Patient was evaluated and treated and all questions answered.  Discussed treatment options for ingrown including partial permanent avulsion.  Would like to order vascular noninvasive testing prior to performing a permanent procedure.  She will follow-up after testing.  I debrided the nail a slant back fashion today to alleviate the pressure temporarily  Return in about 4 weeks (around 06/15/2020) for re-check ingrowing toenails.

## 2020-05-22 ENCOUNTER — Telehealth: Payer: Self-pay

## 2020-05-22 NOTE — Telephone Encounter (Signed)
-----   Message from Criselda Peaches, DPM sent at 05/18/2020  4:04 PM EDT ----- ABI/TBI ordered for [facility]

## 2020-05-22 NOTE — Telephone Encounter (Signed)
Order for vascular studies have been faxed to [facility]

## 2020-05-28 ENCOUNTER — Telehealth (INDEPENDENT_AMBULATORY_CARE_PROVIDER_SITE_OTHER): Payer: Self-pay

## 2020-05-29 NOTE — Telephone Encounter (Signed)
Documentation only.

## 2020-06-01 ENCOUNTER — Ambulatory Visit: Payer: Medicare HMO | Admitting: Podiatry

## 2020-06-01 ENCOUNTER — Ambulatory Visit (INDEPENDENT_AMBULATORY_CARE_PROVIDER_SITE_OTHER): Payer: Medicare HMO

## 2020-06-01 ENCOUNTER — Encounter: Payer: Self-pay | Admitting: Podiatry

## 2020-06-01 ENCOUNTER — Other Ambulatory Visit: Payer: Self-pay

## 2020-06-01 DIAGNOSIS — L600 Ingrowing nail: Secondary | ICD-10-CM

## 2020-06-01 DIAGNOSIS — I739 Peripheral vascular disease, unspecified: Secondary | ICD-10-CM | POA: Diagnosis not present

## 2020-06-01 NOTE — Patient Instructions (Signed)
Soak Instructions    2 DAYS AFTER THE PROCEDURE  Place 1/4 cup of epsom salts (or betadine, or white vinegar) in a quart of warm tap water.  Submerge your foot or feet with outer bandage intact for the initial soak; this will allow the bandage to become moist and wet for easy lift off.  Once you remove your bandage, continue to soak in the solution for 20 minutes.  This soak should be done twice a day.  Next, remove your foot or feet from solution, blot dry the affected area and cover.  You may use a band aid large enough to cover the area or use gauze and tape.  Apply other medications to the area as directed by the doctor such as polysporin neosporin.  IF YOUR SKIN BECOMES IRRITATED WHILE USING THESE INSTRUCTIONS, IT IS OKAY TO SWITCH TO  WHITE VINEGAR AND WATER. Or you may use antibacterial soap and water to keep the toe clean  Monitor for any signs/symptoms of infection. Call the office immediately if any occur or go directly to the emergency room. Call with any questions/concerns.    [facility] Instructions-Post Nail Surgery  You have had your ingrown toenail and root treated with a chemical.  This chemical causes a burn that will drain and ooze like a blister.  This can drain for 6-8 weeks or longer.  It is important to keep this area clean, covered, and follow the soaking instructions dispensed at the time of your surgery.  This area will eventually dry and form a scab.  Once the scab forms you no longer need to soak or apply a dressing.  If at any time you experience an increase in pain, redness, swelling, or drainage, you should contact the office as soon as possible.

## 2020-06-01 NOTE — Progress Notes (Signed)
  Subjective:  Patient ID: Ana Washington, female    DOB: Jul 02, 1945,  MRN: [id number]  Chief Complaint  Patient presents with  . Ingrown Toenail    "its had pus in it and its real tender to touch"    75 y.o. female presents with the above complaint. History confirmed with patient.  She completed her ABIs this morning  Objective:  Physical Exam: warm, good capillary refill, DP reduced bilateral, no trophic changes or ulcerative lesions, normal sensory exam and PT reduced bilateral.  Mild ingrowing right hallux nail border, no paronychia  ABI 1.18 with good waveforms, TBI 0.75 Assessment:   1. Ingrown toenail of right foot      Plan:  Patient was evaluated and treated and all questions answered.    Ingrown Nail, right -Patient elects to proceed with minor surgery to remove ingrown toenail today. Consent reviewed and signed by patient. -Ingrown nail excised. See procedure note. -Educated on post-procedure care including soaking. Written instructions provided and reviewed. -Patient to follow up in 2 weeks for nail check.  Procedure: Excision of Ingrown Toenail Location: Right 1st toe medial nail borders. Anesthesia: Lidocaine 1% plain; 1.5 mL and Marcaine 0.5% plain; 1.5 mL, digital block. Skin Prep: Betadine. Dressing: Silvadene; telfa; dry, sterile, compression dressing. Technique: Following skin prep, the toe was exsanguinated and a tourniquet was secured at the base of the toe. The affected nail border was freed, split with a nail splitter, and excised. The tourniquet was then removed and sterile dressing applied. Disposition: Patient tolerated procedure well. Patient to return in 2 weeks for follow-up.     Return in about 2 weeks (around 06/15/2020) for nail re-check.

## 2020-06-12 LAB — ACID FAST CULTURE WITH REFLEXED SENSITIVITIES (MYCOBACTERIA): Acid Fast Culture: NEGATIVE

## 2020-06-15 ENCOUNTER — Encounter: Payer: Self-pay | Admitting: Podiatry

## 2020-06-15 ENCOUNTER — Other Ambulatory Visit: Payer: Self-pay

## 2020-06-15 ENCOUNTER — Ambulatory Visit: Payer: Medicare HMO | Admitting: Podiatry

## 2020-06-15 DIAGNOSIS — L600 Ingrowing nail: Secondary | ICD-10-CM

## 2020-06-15 NOTE — Progress Notes (Signed)
  Subjective:  Patient ID: Ana Washington, female    DOB: 08-Jun-1945,  MRN: [id number]  Chief Complaint  Patient presents with  . Ingrown Toenail    "I dont think its doing good.  Its still sore and tender to touch"    75 y.o. female returns with the above complaint. History confirmed with patient.  She is doing well she still has some bleeding and oozing  Objective:  Physical Exam: warm, good capillary refill, DP reduced bilateral, no trophic changes or ulcerative lesions, normal sensory exam and PT reduced bilateral.  Matricectomy site is healing well  ABI 1.18 with good waveforms, TBI 0.75 Assessment:   No diagnosis found.   Plan:  Patient was evaluated and treated and all questions answered.    Ingrown Nail, right -Doing quite well she can leave this open to air and discontinue soaks and ointments, advised that it may continue to drain and scab for another week or so.  Return as needed    No follow-ups on file.

## 2020-07-14 ENCOUNTER — Emergency Department: Payer: Medicare HMO

## 2020-07-14 ENCOUNTER — Other Ambulatory Visit: Payer: Self-pay

## 2020-07-14 ENCOUNTER — Inpatient Hospital Stay
Admission: EM | Admit: 2020-07-14 | Discharge: 2020-07-20 | DRG: 871 | Disposition: A | Discharge status: Home Health | Payer: Medicare HMO | Attending: Internal Medicine | Admitting: Internal Medicine

## 2020-07-14 DIAGNOSIS — I110 Hypertensive heart disease with heart failure: Secondary | ICD-10-CM | POA: Diagnosis present

## 2020-07-14 DIAGNOSIS — U071 COVID-19: Secondary | ICD-10-CM | POA: Diagnosis not present

## 2020-07-14 DIAGNOSIS — Z9049 Acquired absence of other specified parts of digestive tract: Secondary | ICD-10-CM

## 2020-07-14 DIAGNOSIS — I252 Old myocardial infarction: Secondary | ICD-10-CM

## 2020-07-14 DIAGNOSIS — I5032 Chronic diastolic (congestive) heart failure: Secondary | ICD-10-CM | POA: Diagnosis present

## 2020-07-14 DIAGNOSIS — K219 Gastro-esophageal reflux disease without esophagitis: Secondary | ICD-10-CM | POA: Diagnosis present

## 2020-07-14 DIAGNOSIS — E872 Acidosis, unspecified: Secondary | ICD-10-CM | POA: Diagnosis present

## 2020-07-14 DIAGNOSIS — E871 Hypo-osmolality and hyponatremia: Secondary | ICD-10-CM | POA: Diagnosis present

## 2020-07-14 DIAGNOSIS — A4189 Other specified sepsis: Secondary | ICD-10-CM | POA: Diagnosis not present

## 2020-07-14 DIAGNOSIS — A310 Pulmonary mycobacterial infection: Secondary | ICD-10-CM | POA: Diagnosis present

## 2020-07-14 DIAGNOSIS — N390 Urinary tract infection, site not specified: Secondary | ICD-10-CM | POA: Diagnosis present

## 2020-07-14 DIAGNOSIS — Z66 Do not resuscitate: Secondary | ICD-10-CM | POA: Diagnosis present

## 2020-07-14 DIAGNOSIS — Z880 Allergy status to penicillin: Secondary | ICD-10-CM

## 2020-07-14 DIAGNOSIS — I480 Paroxysmal atrial fibrillation: Secondary | ICD-10-CM | POA: Diagnosis not present

## 2020-07-14 DIAGNOSIS — I4821 Permanent atrial fibrillation: Secondary | ICD-10-CM | POA: Diagnosis present

## 2020-07-14 DIAGNOSIS — Z79899 Other long term (current) drug therapy: Secondary | ICD-10-CM

## 2020-07-14 DIAGNOSIS — B370 Candidal stomatitis: Secondary | ICD-10-CM | POA: Diagnosis present

## 2020-07-14 DIAGNOSIS — J9610 Chronic respiratory failure, unspecified whether with hypoxia or hypercapnia: Secondary | ICD-10-CM | POA: Diagnosis present

## 2020-07-14 DIAGNOSIS — I248 Other forms of acute ischemic heart disease: Secondary | ICD-10-CM | POA: Diagnosis present

## 2020-07-14 DIAGNOSIS — R778 Other specified abnormalities of plasma proteins: Secondary | ICD-10-CM | POA: Diagnosis present

## 2020-07-14 DIAGNOSIS — Z7901 Long term (current) use of anticoagulants: Secondary | ICD-10-CM

## 2020-07-14 DIAGNOSIS — Z8711 Personal history of peptic ulcer disease: Secondary | ICD-10-CM

## 2020-07-14 DIAGNOSIS — B9620 Unspecified Escherichia coli [E. coli] as the cause of diseases classified elsewhere: Secondary | ICD-10-CM | POA: Diagnosis present

## 2020-07-14 DIAGNOSIS — J471 Bronchiectasis with (acute) exacerbation: Secondary | ICD-10-CM | POA: Diagnosis not present

## 2020-07-14 DIAGNOSIS — R6510 Systemic inflammatory response syndrome (SIRS) of non-infectious origin without acute organ dysfunction: Secondary | ICD-10-CM | POA: Diagnosis present

## 2020-07-14 DIAGNOSIS — Z9981 Dependence on supplemental oxygen: Secondary | ICD-10-CM

## 2020-07-14 DIAGNOSIS — Z882 Allergy status to sulfonamides status: Secondary | ICD-10-CM

## 2020-07-14 DIAGNOSIS — J1282 Pneumonia due to coronavirus disease 2019: Secondary | ICD-10-CM | POA: Diagnosis present

## 2020-07-14 DIAGNOSIS — J470 Bronchiectasis with acute lower respiratory infection: Secondary | ICD-10-CM | POA: Diagnosis present

## 2020-07-14 DIAGNOSIS — A419 Sepsis, unspecified organism: Secondary | ICD-10-CM

## 2020-07-14 DIAGNOSIS — Z885 Allergy status to narcotic agent status: Secondary | ICD-10-CM

## 2020-07-14 DIAGNOSIS — Z8249 Family history of ischemic heart disease and other diseases of the circulatory system: Secondary | ICD-10-CM

## 2020-07-14 DIAGNOSIS — K449 Diaphragmatic hernia without obstruction or gangrene: Secondary | ICD-10-CM | POA: Diagnosis present

## 2020-07-14 DIAGNOSIS — E782 Mixed hyperlipidemia: Secondary | ICD-10-CM | POA: Diagnosis present

## 2020-07-14 DIAGNOSIS — F411 Generalized anxiety disorder: Secondary | ICD-10-CM | POA: Diagnosis present

## 2020-07-14 DIAGNOSIS — R1310 Dysphagia, unspecified: Secondary | ICD-10-CM | POA: Diagnosis present

## 2020-07-14 DIAGNOSIS — I2510 Atherosclerotic heart disease of native coronary artery without angina pectoris: Secondary | ICD-10-CM | POA: Diagnosis present

## 2020-07-14 DIAGNOSIS — I10 Essential (primary) hypertension: Secondary | ICD-10-CM | POA: Diagnosis present

## 2020-07-14 DIAGNOSIS — Z1612 Extended spectrum beta lactamase (ESBL) resistance: Secondary | ICD-10-CM | POA: Diagnosis present

## 2020-07-14 LAB — CBC
HCT: 40.9 % (ref 36.0–46.0)
Hemoglobin: 14.2 g/dL (ref 12.0–15.0)
MCH: 33.4 pg (ref 26.0–34.0)
MCHC: 34.7 g/dL (ref 30.0–36.0)
MCV: 96.2 fL (ref 80.0–100.0)
Platelets: 349 10*3/uL (ref 150–400)
RBC: 4.25 MIL/uL (ref 3.87–5.11)
RDW: 12.1 % (ref 11.5–15.5)
WBC: 16.3 10*3/uL — ABNORMAL HIGH (ref 4.0–10.5)
nRBC: 0 % (ref 0.0–0.2)

## 2020-07-14 LAB — HEPATIC FUNCTION PANEL
ALT: 20 U/L (ref 0–44)
AST: 30 U/L (ref 15–41)
Albumin: 3.9 g/dL (ref 3.5–5.0)
Alkaline Phosphatase: 50 U/L (ref 38–126)
Bilirubin, Direct: 0.1 mg/dL (ref 0.0–0.2)
Indirect Bilirubin: 0.6 mg/dL (ref 0.3–0.9)
Total Bilirubin: 0.7 mg/dL (ref 0.3–1.2)
Total Protein: 6.9 g/dL (ref 6.5–8.1)

## 2020-07-14 LAB — BASIC METABOLIC PANEL
Anion gap: 9 (ref 5–15)
BUN: 10 mg/dL (ref 8–23)
CO2: 37 mmol/L — ABNORMAL HIGH (ref 22–32)
Calcium: 9.3 mg/dL (ref 8.9–10.3)
Chloride: 83 mmol/L — ABNORMAL LOW (ref 98–111)
Creatinine, Ser: 0.58 mg/dL (ref 0.44–1.00)
GFR, Estimated: >60 mL/min (ref >60)
Glucose, Bld: 87 mg/dL (ref 70–99)
Potassium: 4.1 mmol/L (ref 3.5–5.1)
Sodium: 129 mmol/L — ABNORMAL LOW (ref 135–145)

## 2020-07-14 LAB — PROCALCITONIN: Procalcitonin: <0.1 ng/mL

## 2020-07-14 LAB — RESP PANEL BY RT-PCR (FLU A&B, COVID) ARPGX2
Influenza A by PCR: NEGATIVE
Influenza B by PCR: NEGATIVE
SARS Coronavirus 2 by RT PCR: POSITIVE — AB

## 2020-07-14 LAB — URINALYSIS, COMPLETE (UACMP) WITH MICROSCOPIC
Bilirubin Urine: NEGATIVE
Glucose, UA: NEGATIVE mg/dL
Ketones, ur: NEGATIVE mg/dL
Leukocytes,Ua: NEGATIVE
Nitrite: NEGATIVE
Protein, ur: NEGATIVE mg/dL
Specific Gravity, Urine: 1.023 (ref 1.005–1.030)
Squamous Epithelial / HPF: NONE SEEN (ref 0–5)
pH: 7 (ref 5.0–8.0)

## 2020-07-14 LAB — PROTIME-INR
INR: 1 (ref 0.8–1.2)
Prothrombin Time: 13.1 seconds (ref 11.4–15.2)

## 2020-07-14 LAB — APTT: aPTT: 33 seconds (ref 24–36)

## 2020-07-14 LAB — LACTIC ACID, PLASMA
Lactic Acid, Venous: 2.5 mmol/L (ref 0.5–1.9)
Lactic Acid, Venous: 1.7 mmol/L (ref 0.5–1.9)

## 2020-07-14 LAB — D-DIMER, QUANTITATIVE: D-Dimer, Quant: 0.57 ug/mL-FEU — ABNORMAL HIGH (ref 0.00–0.50)

## 2020-07-14 LAB — BRAIN NATRIURETIC PEPTIDE: B Natriuretic Peptide: 181.4 pg/mL — ABNORMAL HIGH (ref 0.0–100.0)

## 2020-07-14 LAB — C-REACTIVE PROTEIN: CRP: 7.8 mg/dL — ABNORMAL HIGH (ref <1.0)

## 2020-07-14 LAB — TROPONIN I (HIGH SENSITIVITY): Troponin I (High Sensitivity): 49 ng/L — ABNORMAL HIGH (ref <18)

## 2020-07-14 MED ORDER — ASCORBIC ACID 500 MG PO TABS
500.0000 mg | ORAL_TABLET | Freq: Every day | ORAL | Status: DC
  Administered 2020-07-14 – 2020-07-20 (×7): 500 mg via ORAL
  Filled 2020-07-14 (×7): qty 1

## 2020-07-14 MED ORDER — APIXABAN 5 MG PO TABS
5.0000 mg | ORAL_TABLET | Freq: Two times a day (BID) | ORAL | Status: DC
  Administered 2020-07-14 – 2020-07-20 (×12): 5 mg via ORAL
  Filled 2020-07-14 (×12): qty 1

## 2020-07-14 MED ORDER — SODIUM CHLORIDE 0.9 % IV SOLN
500.0000 mg | INTRAVENOUS | Status: DC
  Administered 2020-07-14 – 2020-07-19 (×6): 500 mg via INTRAVENOUS
  Filled 2020-07-14 (×7): qty 500

## 2020-07-14 MED ORDER — SODIUM CHLORIDE 0.9 % IV SOLN
2.0000 g | Freq: Two times a day (BID) | INTRAVENOUS | Status: DC
  Administered 2020-07-15 – 2020-07-17 (×6): 2 g via INTRAVENOUS
  Filled 2020-07-14 (×8): qty 2

## 2020-07-14 MED ORDER — DIGOXIN 125 MCG PO TABS
0.1250 mg | ORAL_TABLET | Freq: Every day | ORAL | Status: DC
  Administered 2020-07-14: 0.125 mg via ORAL
  Filled 2020-07-14: qty 1

## 2020-07-14 MED ORDER — SODIUM CHLORIDE 0.9 % IV SOLN
100.0000 mg | Freq: Every day | INTRAVENOUS | Status: AC
  Administered 2020-07-15 – 2020-07-18 (×4): 100 mg via INTRAVENOUS
  Filled 2020-07-14 (×4): qty 100

## 2020-07-14 MED ORDER — ONDANSETRON HCL 4 MG/2ML IJ SOLN: 4.0000 mg | Freq: Four times a day (QID) | INTRAMUSCULAR | Status: DC | PRN

## 2020-07-14 MED ORDER — LEVALBUTEROL HCL 0.63 MG/3ML IN NEBU: 0.6300 mg | INHALATION_SOLUTION | Freq: Three times a day (TID) | RESPIRATORY_TRACT | Status: DC

## 2020-07-14 MED ORDER — SENNA 8.6 MG PO TABS
2.0000 | ORAL_TABLET | Freq: Every day | ORAL | Status: DC
  Administered 2020-07-14 – 2020-07-19 (×5): 17.2 mg via ORAL
  Filled 2020-07-14 (×5): qty 2

## 2020-07-14 MED ORDER — IOHEXOL 350 MG/ML SOLN
75.0000 mL | Freq: Once | INTRAVENOUS | Status: AC | PRN
  Administered 2020-07-14: 75 mL via INTRAVENOUS

## 2020-07-14 MED ORDER — METRONIDAZOLE 500 MG/100ML IV SOLN: 500.0000 mg | Freq: Once | INTRAVENOUS | Status: DC

## 2020-07-14 MED ORDER — GABAPENTIN 300 MG PO CAPS
300.0000 mg | ORAL_CAPSULE | Freq: Four times a day (QID) | ORAL | Status: DC
  Administered 2020-07-14 – 2020-07-20 (×22): 300 mg via ORAL
  Filled 2020-07-14 (×21): qty 1

## 2020-07-14 MED ORDER — PANTOPRAZOLE SODIUM 40 MG PO TBEC
40.0000 mg | DELAYED_RELEASE_TABLET | Freq: Every day | ORAL | Status: DC
  Administered 2020-07-14 – 2020-07-20 (×7): 40 mg via ORAL
  Filled 2020-07-14 (×7): qty 1

## 2020-07-14 MED ORDER — POLYETHYLENE GLYCOL 3350 17 G PO PACK
17.0000 g | PACK | Freq: Every day | ORAL | Status: DC | PRN
  Administered 2020-07-15 – 2020-07-18 (×3): 17 g via ORAL
  Filled 2020-07-14 (×3): qty 1

## 2020-07-14 MED ORDER — DILTIAZEM HCL ER BEADS 180 MG PO CP24: 180.0000 mg | ORAL_CAPSULE | Freq: Every day | ORAL | Status: DC

## 2020-07-14 MED ORDER — LEVALBUTEROL TARTRATE 45 MCG/ACT IN AERO
2.0000 | INHALATION_SPRAY | RESPIRATORY_TRACT | Status: DC | PRN
  Administered 2020-07-15: 2 via RESPIRATORY_TRACT
  Filled 2020-07-14: qty 15

## 2020-07-14 MED ORDER — ONDANSETRON HCL 4 MG PO TABS: 4.0000 mg | ORAL_TABLET | Freq: Four times a day (QID) | ORAL | Status: DC | PRN

## 2020-07-14 MED ORDER — METOPROLOL SUCCINATE ER 100 MG PO TB24
100.0000 mg | ORAL_TABLET | Freq: Every day | ORAL | Status: DC
  Administered 2020-07-14 – 2020-07-20 (×6): 100 mg via ORAL
  Filled 2020-07-14 (×4): qty 1
  Filled 2020-07-14: qty 2
  Filled 2020-07-14 (×2): qty 1

## 2020-07-14 MED ORDER — LEVALBUTEROL TARTRATE 45 MCG/ACT IN AERO
2.0000 | INHALATION_SPRAY | Freq: Four times a day (QID) | RESPIRATORY_TRACT | Status: DC
  Administered 2020-07-15 – 2020-07-20 (×19): 2 via RESPIRATORY_TRACT
  Filled 2020-07-14 (×2): qty 15

## 2020-07-14 MED ORDER — LORAZEPAM 0.5 MG PO TABS
0.5000 mg | ORAL_TABLET | Freq: Every day | ORAL | Status: DC
  Administered 2020-07-14 – 2020-07-19 (×6): 0.5 mg via ORAL
  Filled 2020-07-14 (×6): qty 1

## 2020-07-14 MED ORDER — FERROUS SULFATE 325 (65 FE) MG PO TABS
325.0000 mg | ORAL_TABLET | Freq: Every day | ORAL | Status: DC
  Administered 2020-07-15 – 2020-07-20 (×6): 325 mg via ORAL
  Filled 2020-07-14 (×6): qty 1

## 2020-07-14 MED ORDER — LACTATED RINGERS IV SOLN: INTRAVENOUS | Status: AC

## 2020-07-14 MED ORDER — SODIUM CHLORIDE 0.9 % IV BOLUS
1000.0000 mL | Freq: Once | INTRAVENOUS | Status: AC
  Administered 2020-07-14: 1000 mL via INTRAVENOUS

## 2020-07-14 MED ORDER — ACETAMINOPHEN 325 MG PO TABS
650.0000 mg | ORAL_TABLET | Freq: Four times a day (QID) | ORAL | Status: DC | PRN
  Administered 2020-07-14 – 2020-07-19 (×7): 650 mg via ORAL
  Filled 2020-07-14 (×7): qty 2

## 2020-07-14 MED ORDER — ACETAMINOPHEN 650 MG RE SUPP: 650.0000 mg | Freq: Four times a day (QID) | RECTAL | Status: DC | PRN

## 2020-07-14 MED ORDER — VANCOMYCIN HCL IN DEXTROSE 1-5 GM/200ML-% IV SOLN: 1000.0000 mg | Freq: Once | INTRAVENOUS | Status: DC

## 2020-07-14 MED ORDER — BENZONATATE 100 MG PO CAPS: 200.0000 mg | ORAL_CAPSULE | Freq: Three times a day (TID) | ORAL | Status: DC | PRN

## 2020-07-14 MED ORDER — LOSARTAN POTASSIUM 50 MG PO TABS
100.0000 mg | ORAL_TABLET | Freq: Every day | ORAL | Status: DC
  Administered 2020-07-15 – 2020-07-20 (×6): 100 mg via ORAL
  Filled 2020-07-14 (×6): qty 2

## 2020-07-14 MED ORDER — METOPROLOL SUCCINATE ER 50 MG PO TB24: 100.0000 mg | ORAL_TABLET | Freq: Every day | ORAL | Status: DC

## 2020-07-14 MED ORDER — SODIUM CHLORIDE 0.9 % IV SOLN: 2.0000 g | Freq: Once | INTRAVENOUS | Status: DC

## 2020-07-14 MED ORDER — LEVALBUTEROL HCL 0.63 MG/3ML IN NEBU
0.6300 mg | INHALATION_SOLUTION | Freq: Three times a day (TID) | RESPIRATORY_TRACT | Status: DC
  Administered 2020-07-14: 0.63 mg via RESPIRATORY_TRACT
  Filled 2020-07-14: qty 3

## 2020-07-14 MED ORDER — DEXAMETHASONE SODIUM PHOSPHATE 4 MG/ML IJ SOLN
4.0000 mg | INTRAMUSCULAR | Status: DC
  Administered 2020-07-14 – 2020-07-18 (×5): 4 mg via INTRAVENOUS
  Filled 2020-07-14 (×6): qty 1

## 2020-07-14 MED ORDER — SODIUM CHLORIDE 0.9 % IV SOLN
1.0000 g | Freq: Once | INTRAVENOUS | Status: AC
  Administered 2020-07-14: 1 g via INTRAVENOUS
  Filled 2020-07-14: qty 1

## 2020-07-14 MED ORDER — DIGOXIN 125 MCG PO TABS
125.0000 ug | ORAL_TABLET | Freq: Every day | ORAL | Status: DC
  Administered 2020-07-15 – 2020-07-20 (×6): 125 ug via ORAL
  Filled 2020-07-14 (×6): qty 1

## 2020-07-14 MED ORDER — DILTIAZEM HCL ER COATED BEADS 180 MG PO CP24
180.0000 mg | ORAL_CAPSULE | Freq: Every day | ORAL | Status: DC
  Administered 2020-07-14 – 2020-07-20 (×7): 180 mg via ORAL
  Filled 2020-07-14 (×7): qty 1

## 2020-07-14 MED ORDER — ACETYLCYSTEINE 20 % IN SOLN
4.0000 mL | Freq: Every day | RESPIRATORY_TRACT | Status: DC
  Filled 2020-07-14: qty 4

## 2020-07-14 MED ORDER — VANCOMYCIN HCL IN DEXTROSE 1-5 GM/200ML-% IV SOLN
1000.0000 mg | Freq: Once | INTRAVENOUS | Status: AC
  Administered 2020-07-14: 1000 mg via INTRAVENOUS
  Filled 2020-07-14: qty 200

## 2020-07-14 MED ORDER — GLYCOPYRROLATE 1 MG PO TABS
1.0000 mg | ORAL_TABLET | Freq: Three times a day (TID) | ORAL | Status: DC
  Administered 2020-07-14 – 2020-07-20 (×17): 1 mg via ORAL
  Filled 2020-07-14 (×20): qty 1

## 2020-07-14 MED ORDER — ALIGN 4 MG PO CAPS: 4.0000 mg | ORAL_CAPSULE | Freq: Every day | ORAL | Status: DC

## 2020-07-14 MED ORDER — REMDESIVIR 100 MG IV SOLR
200.0000 mg | Freq: Once | INTRAVENOUS | Status: AC
  Administered 2020-07-14: 200 mg via INTRAVENOUS
  Filled 2020-07-14: qty 200

## 2020-07-14 MED ORDER — ACETAMINOPHEN 325 MG PO TABS
650.0000 mg | ORAL_TABLET | Freq: Once | ORAL | Status: AC
  Administered 2020-07-14: 650 mg via ORAL
  Filled 2020-07-14: qty 2

## 2020-07-14 MED ORDER — ZINC SULFATE 220 (50 ZN) MG PO CAPS
220.0000 mg | ORAL_CAPSULE | Freq: Every day | ORAL | Status: DC
  Administered 2020-07-14 – 2020-07-20 (×7): 220 mg via ORAL
  Filled 2020-07-14 (×7): qty 1

## 2020-07-14 MED ORDER — METHYLPREDNISOLONE SODIUM SUCC 40 MG IJ SOLR
40.0000 mg | Freq: Two times a day (BID) | INTRAMUSCULAR | Status: DC
  Administered 2020-07-14: 40 mg via INTRAVENOUS
  Filled 2020-07-14: qty 1

## 2020-07-14 MED ORDER — LACTATED RINGERS IV SOLN: INTRAVENOUS | Status: DC

## 2020-07-14 NOTE — ED Notes (Signed)
Cardiology at bedside.

## 2020-07-14 NOTE — ED Notes (Signed)
Digoxin requested from pharmacy

## 2020-07-14 NOTE — ED Triage Notes (Signed)
First RN Note: pt to ed via [facility] from home with c/o Covid + on 6/5, per EMS pt normall on 2L via [place], turned up to 3L via [place], per EMS pt coughing up green phlegm this morning. Per EMS pt 96% on 3L via [place]. Per EMS pt with hx of A-fib   159/87 100-120 A-fib (hasn't had daily meds) CBG 94 96% on 3L

## 2020-07-14 NOTE — Progress Notes (Signed)
Patient arrived to the unit from the ED, vitals taken, tele 40-50 placed, & assessment completed.

## 2020-07-14 NOTE — ED Provider Notes (Signed)
[facility] Emergency Department Provider Note ____________________________________________   Event Date/Time   First MD Initiated Contact with Patient 07/14/20 1132     (approximate)  I have reviewed the triage vital signs and the nursing notes.   HISTORY  Chief Complaint Covid Positive and Shortness of Breath  HPI Ana Washington is a 75 y.o. female with history of medical issues as listed below presents to the emergency department for treatment and evaluation of worsening COVID symptoms. Shortness of breath has increased over the past 24 hours. Cough is productive of green sputum. Positive for fever.      Past Medical History:  Diagnosis Date   Arthritis    Atrial fibrillation ([place])    Bronchiectasis ([place])    CAD (coronary artery disease) 07/30/2017   Dumping syndrome    Essential hypertension, malignant 10/03/2013   Family history of adverse reaction to anesthesia    sister [name]   GERD (gastroesophageal reflux disease)    Headache    MIGRAINES   Myocardial infarction ([place]) 2007   Non-STEMI   [name] (postoperative nausea and vomiting)    Psoriasis    PUD (peptic ulcer disease)     Patient Active Problem List   Diagnosis Date Noted   Acute exacerbation of bronchiectasis ([place]) 07/14/2020   COVID-19 virus infection 07/14/2020   Lactic acidosis 07/14/2020   SIRS (systemic inflammatory response syndrome) ([place]) 07/14/2020   Elevated troponin level not due myocardial infarction 07/14/2020   Anxiety    Constipation    Pressure injury of right heel, stage 1    Bronchiectasis with (acute) exacerbation ([place]) 04/29/2020   Hemoptysis    Chronic respiratory failure with hypoxia ([place])    AKI (acute kidney injury) ([place])    Bronchiectasis ([place]) 09/08/2019   Atrial fibrillation, chronic ([place]) 09/08/2019   Closed right hip fracture ([place]) 09/27/2018   Intractable nausea and vomiting 04/08/2018   Atrial fibrillation with RVR ([place]) 04/08/2018   Thoracic  radiculitis (Bilateral) 03/29/2018   Vasovagal episode 03/29/2018   Closed compression fracture of T10 thoracic vertebra, sequela 03/29/2018   Abnormal MRI, lumbar spine (12/15/2016) 03/21/2018   Lumbar compression fractures, sequela (L1, L2, L3, L4, and L5) 03/21/2018   Closed compression fracture of L1 lumbar vertebra, sequela 03/21/2018   Closed compression fracture of L2 lumbar vertebra, sequela 03/21/2018   Closed compression fracture of L3 lumbar vertebra, sequela 03/21/2018   Closed compression fracture of L4 lumbar vertebra, sequela 03/21/2018   Closed compression fracture of L5 lumbar vertebra, sequela 03/21/2018   Thoracic compression fracture, sequela (T5, T9, T10, T11, and T12) 03/21/2018   Closed compression fracture of T5 thoracic vertebra, sequela 03/21/2018   Closed compression fracture of T9 thoracic vertebra, sequela 03/21/2018   Close compression fracture of T11 thoracic vertebra, sequela 03/21/2018   Closed compression fracture of T12 thoracic vertebra, sequela 03/21/2018   Lumbar facet hypertrophy 03/21/2018   Grade 1  Lumbar Anterolisthesis of L3/4 and L4/5 03/21/2018   Lumbar central spinal stenosis (Multilevel), 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 claudication 03/21/2018   Chronic anticoagulation (ELIQUIS) 03/21/2018   History of pelvic fracture 03/21/2018   Chronic musculoskeletal pain 03/21/2018   Neurogenic pain 03/21/2018   Long term prescription benzodiazepine use 03/21/2018   DDD (degenerative disc disease), 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   Adult bronchiectasis ([place]) 03/01/2018   Diverticulitis 03/01/2018   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[place]) 03/01/2018   Migraines 03/01/2018   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   Osteoporosis, 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   Psoriasis 03/01/2018   Chronic upper  back pain (Primary Area of Pain) (Bilateral) (R>L) 03/01/2018   Chronic low back pain (Secondary Area of Pain) (Bilateral) (R>L) w/o sciatica 03/01/2018   Chronic pain syndrome  03/01/2018   Long term current use of opiate analgesic 03/01/2018   Pharmacologic therapy 03/01/2018   Disorder of skeletal system 03/01/2018   Problems influencing health status 03/01/2018   History of kyphoplasty (L1, L2, L3, T9, T11, and T12) 01/05/2018   Malnutrition of moderate degree ([place]) 08/03/2017   GERD (gastroesophageal reflux disease) 07/30/2017   Pelvic fracture ([place]) 07/30/2017   AF (paroxysmal atrial fibrillation) ([place]) 07/30/2017   Other dysphagia 09/13/2016   Unintended weight loss 09/13/2016   Essential hypertension 10/03/2013   Osteoarthritis of knees (Bilateral) 10/03/2013   Primary localized osteoarthrosis, lower leg 10/03/2013   Non-ischemic cardiomyopathy ([place]) 09/12/2013   Coronary artery disease involving native coronary artery of native heart without angina pectoris 07/28/2012   Hyperlipidemia, mixed 07/28/2012   DDD (degenerative disc disease), lumbar 02/29/2012    Past Surgical History:  Procedure Laterality Date   BACK SURGERY     CHOLECYSTECTOMY     ESOPHAGOGASTRODUODENOSCOPY (EGD) WITH PROPOFOL N/A 03/19/2019   Procedure: ESOPHAGOGASTRODUODENOSCOPY (EGD) WITH PROPOFOL;  Surgeon: [name], MD;  Location: [facility] ENDOSCOPY;  Service: Gastroenterology;  Laterality: N/A;  *Note to anesthesia: Per pt's pulmonologist, if intubating, please extubate to BIPAP.   EYE SURGERY     FOOT SURGERY     INTRAMEDULLARY (IM) NAIL INTERTROCHANTERIC Right 09/30/2018   Procedure: INTRAMEDULLARY (IM) NAIL INTERTROCHANTRIC;  Surgeon: Dereck Leep, MD;  Location: [facility] ORS;  Service: Orthopedics;  Laterality: Right;   KYPHOPLASTY N/A 07/05/2016   Procedure: KYPHOPLASTY T - 9;  Surgeon: Hessie Knows, MD;  Location: [facility] ORS;  Service: Orthopedics;  Laterality: N/A;   KYPHOPLASTY N/A 11/29/2017   Procedure: Iona Hansen;  Surgeon: Hessie Knows, MD;  Location: [facility] ORS;  Service: Orthopedics;  Laterality: N/A;  L2 and L3   KYPHOPLASTY N/A 12/18/2017   Procedure:  KYPHOPLASTY L1;  Surgeon: Hessie Knows, MD;  Location: [facility] ORS;  Service: Orthopedics;  Laterality: N/A;   KYPHOPLASTY N/A 01/05/2018   Procedure: KYPHOPLASTY-T11,T12;  Surgeon: Hessie Knows, MD;  Location: [facility] ORS;  Service: Orthopedics;  Laterality: N/A;   KYPHOPLASTY N/A 04/05/2018   Procedure: T10 KYPHOPLASTY;  Surgeon: Hessie Knows, MD;  Location: [facility] ORS;  Service: Orthopedics;  Laterality: N/A;   KYPHOPLASTY N/A 04/12/2018   Procedure: KYPHOPLASTY T7,8;  Surgeon: Hessie Knows, MD;  Location: [facility] ORS;  Service: Orthopedics;  Laterality: N/A;   KYPHOPLASTY N/A 04/19/2018   Procedure: KYPHOPLASTY T5, T6;  Surgeon: Hessie Knows, MD;  Location: [facility] ORS;  Service: Orthopedics;  Laterality: N/A;   LUNG SURGERY  1990 and 1996   THOROCOTOMY WITH LOBECTOMY     LEFT LOWER THORACOTOMY / RIGHT MIDDLE LOBECTOMY    Prior to Admission medications   Medication Sig Start Date End Date Taking? Authorizing Provider  acetaminophen (TYLENOL) 500 MG tablet Take 1,000 mg by mouth every 6 (six) hours as needed for mild pain or headache.     [provider]  acetylcysteine (MUCOMYST) 20 % nebulizer solution Take 4 mLs by nebulization daily. 04/22/20   [provider]  amikacin (AMIKIN) 500 MG/2ML SOLN injection  05/16/20   [provider]  amLODipine (NORVASC) 2.5 MG tablet Take 2.5 mg by mouth daily. 05/11/20   [provider]  apixaban (ELIQUIS) 5 MG TABS tablet Take 5 mg by mouth 2 (two) times daily.     [provider]  Ascorbic Acid (VITAMIN C) 1000 MG tablet Take 1,000 mg by mouth daily.    [provider]  azithromycin (ZITHROMAX) 250 MG tablet Take 250 mg by mouth daily. 03/25/20   [provider]  benzonatate (TESSALON) 200 MG capsule Take 200 mg by mouth 3 (three) times daily as needed. 04/02/20   [provider]  ciprofloxacin (CIPRO) 500 MG tablet TAKE 1 TABLET(500 MG) BY MOUTH TWICE DAILY Patient not taking: No sig reported  04/16/20 10/13/20  [provider]  COMBIVENT RESPIMAT 20-100 MCG/ACT AERS respimat 1 puff every 6 (six) hours as needed for wheezing. 06/09/20   [provider]  digoxin (LANOXIN) 0.125 MG tablet Take 125 mcg by mouth daily. 05/11/20   [provider]  diltiazem (CARDIZEM CD) 180 MG 24 hr capsule Take 180 mg by mouth daily. 06/21/20   [provider]  diltiazem (TIAZAC) 180 MG 24 hr capsule Take by mouth. Patient not taking: No sig reported    [provider]  ferrous sulfate 325 (65 FE) MG EC tablet Take 325 mg by mouth daily.     [provider]  furosemide (LASIX) 20 MG tablet Take 20 mg by mouth daily as needed for edema. 07/08/19   [provider]  gabapentin (NEURONTIN) 300 MG capsule Take 300 mg by mouth 4 (four) times daily.     [provider]  glycopyrrolate (ROBINUL) 1 MG tablet Take 1 tablet by mouth 3 (three) times daily. 01/06/20 01/05/21  [provider]  levalbuterol Penne Lash) 0.31 MG/3ML nebulizer solution Inhale 3 mLs into the lungs every 6 (six) hours as needed. 12/10/19   [provider]  LORazepam (ATIVAN) 0.5 MG tablet Take 1 tablet (0.5 mg total) by mouth at bedtime. 09/10/19   Enzo Bi, MD  losartan (COZAAR) 100 MG tablet Take 100 mg by mouth daily. 07/25/19   [provider]  methylPREDNISolone sodium succinate (SOLU-MEDROL) 40 mg/mL injection SMARTSIG:20 Milligram(s) IV Once 04/28/20   [provider]  metoprolol succinate (TOPROL-XL) 100 MG 24 hr tablet Take 1 tablet (100 mg total) by mouth daily. 10/11/18   Hillary Bow, MD  NAC 600 MG CAPS Take 600 mg by mouth daily after breakfast.  08/17/19   [provider]  omeprazole (PRILOSEC) 20 MG capsule Take 20 mg by mouth daily. 08/26/19   [provider]  PAXLOVID 20 x 150 MG & 10 x '100MG'$  TBPK See admin instructions. 07/06/20   [provider]  polyethylene glycol (MIRALAX / GLYCOLAX) 17 g packet Take 17 g  by mouth daily as needed for severe constipation. 05/02/20   Loletha Grayer, MD  potassium chloride (K-DUR) 10 MEQ tablet Take 10 mEq by mouth daily.    [provider]  predniSONE (DELTASONE) 20 MG tablet Take 20 mg by mouth daily. Patient not taking: No sig reported 04/27/20   [provider]  predniSONE (DELTASONE) 5 MG tablet Take 5 mg by mouth daily. 06/08/20   [provider]  Probiotic Product (ALIGN) 4 MG CAPS Take 4 mg by mouth daily.    [provider]  senna (SENOKOT) 8.6 MG TABS tablet Take 2 tablets (17.2 mg total) by mouth at bedtime. 05/02/20   Loletha Grayer, MD  tobramycin, PF, (TOBI) 300 MG/5ML nebulizer solution Take 5 mLs by nebulization 2 (two) times daily. 01/10/20   [provider]    Allergies Codeine, Sulfa antibiotics, and Penicillins  Family History  Problem Relation Age of Onset   Hypertension  Mother    Hypertension Father     Social History Social History   Tobacco Use   Smoking status: Never   Smokeless tobacco: Never  Vaping Use   Vaping Use: Never used  Substance Use Topics   Alcohol use: No   Drug use: No    Review of Systems  Constitutional: Positive for fever/chills. Decreased appetite. Eyes: No visual changes. ENT: No sore throat. Cardiovascular: Denies chest pain. Respiratory: Positive for shortness of breath. Gastrointestinal: No abdominal pain.  No nausea, no vomiting.  No diarrhea.  No constipation. Genitourinary: Negative for dysuria. Musculoskeletal: Negative for back pain. Skin: Negative for rash. Neurological: Negative for headaches, focal weakness or numbness. ___________________________________________   PHYSICAL EXAM:  VITAL SIGNS: ED Triage Vitals  Enc Vitals Group     BP 07/14/20 1113 (!) 152/102     Pulse Rate 07/14/20 1112 (!) 123     Resp 07/14/20 1112 (!) 30     Temp 07/14/20 1112 100.3 F (37.9 C)     Temp Source 07/14/20 1112 Oral     SpO2 07/14/20 1112 96 %      Weight 07/14/20 1113 110 lb (49.9 kg)     Height 07/14/20 1113 '5\' 5"'$  (1.651 m)     Head Circumference --      Peak Flow --      Pain Score 07/14/20 1111 4     Pain Loc --      Pain Edu? --      Excl. in [place]? --     Constitutional: Alert and oriented. Acutely ill appearing and in no acute distress. Eyes: Conjunctivae are normal. Head: Atraumatic. Nose: No congestion/rhinnorhea. Mouth/Throat: Mucous membranes are moist.  oropharynx non-erythematous. Neck: No stridor.   Hematological/Lymphatic/Immunilogical: No cervical lymphadenopathy. Cardiovascular: Normal rate, regular rhythm. Grossly normal heart sounds.  Good peripheral circulation. Respiratory: Normal respiratory effort.  No retractions. Scattered rhonchi. Gastrointestinal: Soft and nontender. No distention. No abdominal bruits.  Musculoskeletal: No lower extremity tenderness. Bilateral 2+ pitting peripheral edema of feet.  No joint effusions. Neurologic:  Normal speech and language. No gross focal neurologic deficits are appreciated.  Skin:  Skin is warm, dry and intact. No rash noted. Psychiatric: Mood and affect are normal. Speech and behavior are normal.  ____________________________________________   LABS (all labs ordered are listed, but only abnormal results are displayed)  Labs Reviewed  RESP PANEL BY RT-PCR (FLU A&B, COVID) ARPGX2 - Abnormal; Notable for the following components:      Result Value   SARS Coronavirus 2 by RT PCR POSITIVE (*)    All other components within normal limits  CBC - Abnormal; Notable for the following components:   WBC 16.3 (*)    All other components within normal limits  BASIC METABOLIC PANEL - Abnormal; Notable for the following components:   Sodium 129 (*)    Chloride 83 (*)    CO2 37 (*)    All other components within normal limits  LACTIC ACID, PLASMA - Abnormal; Notable for the following components:   Lactic Acid, Venous 2.5 (*)    All other components within normal limits   URINALYSIS, COMPLETE (UACMP) WITH MICROSCOPIC - Abnormal; Notable for the following components:   Color, Urine YELLOW (*)    APPearance CLEAR (*)    Hgb urine dipstick SMALL (*)    Bacteria, UA FEW (*)    All other components within normal limits  BRAIN NATRIURETIC PEPTIDE - Abnormal; Notable for the following components:   B  Natriuretic Peptide 181.4 (*)    All other components within normal limits  D-DIMER, QUANTITATIVE - Abnormal; Notable for the following components:   D-Dimer, Quant 0.57 (*)    All other components within normal limits  TROPONIN I (HIGH SENSITIVITY) - Abnormal; Notable for the following components:   Troponin I (High Sensitivity) 49 (*)    All other components within normal limits  URINE CULTURE  CULTURE, BLOOD (ROUTINE X 2)  CULTURE, BLOOD (ROUTINE X 2)  LACTIC ACID, PLASMA  PROTIME-INR  APTT  HEPATIC FUNCTION PANEL  C-REACTIVE PROTEIN  PROCALCITONIN   ____________________________________________  EKG  Repeat requested. ____________________________________________  RADIOLOGY  ED MD interpretation:    Chest x-ray negative for acute concerns.  CTA chest showing progression of bronchiectasis especially on the right, but no indication of PE or acute infection/COVID pneumonia.  I, Sherrie George, personally viewed and evaluated these images (plain radiographs) as part of my medical decision making, as well as reviewing the written report by the radiologist.  Official radiology report(s): DG Chest 2 View  Result Date: 07/14/2020 CLINICAL DATA:  75 year old female with shortness of breath and cough. EXAM: CHEST - 2 VIEW COMPARISON:  Chest CT 04/29/2020 and earlier. FINDINGS: Seated AP and lateral views of the chest. Large chronic left diaphragmatic hernia appears stable. Stable cardiac size and mediastinal contours. Bronchiectasis better demonstrated by CT. Chronic staple line in the right upper lobe. Stable coarse bilateral pulmonary interstitial  opacity. No pneumothorax, pulmonary edema, or acute pulmonary opacity. Diffuse spinal compression fractures with at least 10 levels previously augmented. Stable visualized osseous structures. Negative visible bowel gas pattern. IMPRESSION: Chronic lung disease with bronchiectasis. Chronic left diaphragmatic hernia. No acute cardiopulmonary abnormality identified. Electronically Signed   By: Genevie Ann M.D.   On: 07/14/2020 11:54   CT Angio Chest PE W and/or Wo Contrast  Result Date: 07/14/2020 CLINICAL DATA:  PE suspected, high prob EXAM: CT ANGIOGRAPHY CHEST WITH CONTRAST TECHNIQUE: Multidetector CT imaging of the chest was performed using the standard protocol during bolus administration of intravenous contrast. Multiplanar CT image reconstructions and MIPs were obtained to evaluate the vascular anatomy. CONTRAST:  56m OMNIPAQUE IOHEXOL 350 MG/ML SOLN COMPARISON:  Chest CT 04/29/2020 FINDINGS: Cardiovascular: Satisfactory opacification of the pulmonary arteries to the segmental level. No evidence of pulmonary embolism. Unchanged enlarged central pulmonary arteries compatible with pulmonary hypertension. Unchanged mild cardiomegaly. Atherosclerotic calcifications of the aorta. Coronary artery calcifications. Mediastinum/Nodes: There is no mediastinal, hilar, or axillary lymphadenopathy. Thyroid is unremarkable. There is a large hiatal hernia containing the a hepatic flexure of the colon and the stomach, similar to prior exam. Lungs/Pleura: Diffuse bronchial wall thickening with multifocal bronchiectasis, most severe in the right upper lobe and lung bases bilaterally. Multifocal mucous plugging. Similar architectural distortion in the of the left lower lobe. Prior resections of the lateral segment of the right upper lobe, the right middle lobe, and inferior lingula. There is small right-sided subpleural nodularity, increased from prior exam, largest measuring 2.0 x 0.6 cm, and another measuring 0.9 x 0.8 cm  (series 7, images 53 and 41 respectively). Similar subpleural nodularity in the left upper lobe. Scattered lung scarring and tree-in-bud nodularity. Mosaic attenuation bilaterally, likely due to air trapping. Upper Abdomen: Unchanged left renal cyst. Prior cholecystectomy. No acute abnormality. Musculoskeletal: Unchanged multiple thoracolumbar compression deformities with vertebroplasties. Unchanged chronic T3 compression deformity. Partially visualized severe degenerative changes in the lower cervical spine. Review of the MIP images confirms the above findings. IMPRESSION: No evidence of pulmonary embolism.  Unchanged enlarged pulmonary arteries suggestive of pulmonary hypertension. 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 Increased subpleural nodularity on the right, which may represent progression of disease. Chronic mosaic attenuation of the lungs likely due to air trapping. Recommend follow-up chest CT in 3 months and follow-up with pulmonology. Aortic Atherosclerosis (ICD10-I70.0). Electronically Signed   By: Maurine Simmering   On: 07/14/2020 13:33    ____________________________________________   PROCEDURES  Procedure(s) performed (including Critical Care):  Procedures  ____________________________________________   INITIAL IMPRESSION / ASSESSMENT AND PLAN     75 year old female presenting to the ER for worsening shortness of breath with recent COVID 19 diagnosis on July 05, 2020. See HPI. Will await labs and imaging studies.  DIFFERENTIAL DIAGNOSIS  COVID pneumonia; sepsis of unknown origin  ED COURSE  Clinical Course as of 07/14/20 1643  Tue Jul 14, 2020  1234 Lactic 2.5. Sepsis protocol and antibiotics ordered. Patient with stable vital signs [CT]  1340 Chest x-ray and CTA without acute findings of concern. Dr. Lanney Gins aware of patient and pending admission.  Hospitalist services requested. [CT]  1356 Patient accepted for admission. [CT]  1403 COVID test was a home test. 2 hour PCR ordered.  [CT]    Clinical Course User Index [CT] Terresa Marlett B, FNP   ___________________________________________   FINAL CLINICAL IMPRESSION(S) / ED DIAGNOSES  Final diagnoses:  Sepsis without acute organ dysfunction, due to unspecified organism [facility]     ED Discharge Orders     None        Ana Washington was evaluated in Emergency Department on 07/14/2020 for the symptoms described in the history of present illness. She was evaluated in the context of the global COVID-19 pandemic, which necessitated consideration that the patient might be at risk for infection with the SARS-CoV-2 virus that causes COVID-19. Institutional protocols and algorithms that pertain to the evaluation of patients at risk for COVID-19 are in a state of rapid change based on information released by regulatory bodies including the CDC and federal and state organizations. These policies and algorithms were followed during the patient's care in the ED.   Note:  This document was prepared using Dragon voice recognition software and may include unintentional dictation errors.    Victorino Dike, FNP 07/14/20 1643    Duffy Bruce, MD 07/16/20 [phone]

## 2020-07-14 NOTE — H&P (Signed)
History and Physical    Ana Washington LGX:[id number] DOB: Jun 13, 1945 DOA: 07/14/2020  PCP: [name], MD  Patient coming from: Home   Chief Complaint:  Chief Complaint  Patient presents with   Covid Positive   Shortness of Breath     HPI:    75 year old female with past medical history of bronchiectasis, MAC, chronic respiratory failure (on 2lpm via [place] at home), Takotsubo cardiomyopathy (2015 at [facility]), gastroesophageal reflux disease, hypertension, paroxysmal atrial fibrillation (on Eliquis), generalized anxiety disorder, diastolic congestive heart failure (Echo 03/2020 EF 50-55% with G1DD) who presents to  at [facility]  emergency department with complaints of shortness of breath.  Patient explains that approximately 10 days ago her husband became unwell.  He began to develop cough and shortness of breath.  On Sunday, June 5th she and her husband both used an at home test and were positive for COVID.  At that time, she was asymptomatic.  In the days that followed, her husband began to recover however she began to develop very mild symptoms of malaise and increasing frequency of cough.  She contacted her physician early the week of June 6 and he prescribed her a home regimen of Paxlovid and was instructed to take a 5-day course.  Patient explains that she attempted to take this medications as instructed but began to feel that it caused her "throat to swell up."  She contacted her provider again the next day and instructed her to stop taking the medication.  She was instead provided with a short  By the following weekend, her husband symptoms had nearly resolved however she began to develop severe weakness, malaise increasing cough and now shortness of breath.  Patient repeated her COVID-19 home test on Sunday 6/12 and was once again found to be positive while her husband tested negative.  Patient's symptoms continue to worsen.  Patient complains of severe shortness of breath, worse with  minimal exertion and improved with rest.  Patient complains of associated severe cough that has increased compared to her chronic cough productive of green sputum.  The severe shortness of breath persisted despite increasing frequency of nebulized treatments at home as well as increasing her home regimen of supplemental oxygen to 3 L via nasal cannula.  Patient also complains of associated fevers poor appetite and muscle aches.  Patient symptoms continue to worsen until she eventually presented to [facility] emergency department for evaluation.    Upon evaluation in the emergency department patient was provided 500 cc of isotonic intravenous fluids as well as given a one-time dose of cefepime.  Patient was found to have a somewhat elevated lactic acid of 2.5 as well as an elevated white blood cell count of 16.9.  CT angiogram of the chest was also performed which was found to be negative for pulmonary embolism.  It did however reveal multifocal bronchiectasis with mucous plugging and mosaic attenuation consistent with air trapping.  The hospitalist group was then called to assist the patient for admission to the hospital.  Review of Systems:   Review of Systems  Constitutional:  Positive for fever and malaise/fatigue.  Respiratory:  Positive for cough and shortness of breath.   Musculoskeletal:  Positive for myalgias.  Neurological:  Positive for weakness.  All other systems reviewed and are negative.  Past Medical History:  Diagnosis Date   Arthritis    Atrial fibrillation ([place])    Bronchiectasis (HCC)    CAD (coronary artery disease) 07/30/2017   Dumping syndrome  Essential hypertension, malignant 10/03/2013   Family history of adverse reaction to anesthesia    sister [name]   GERD (gastroesophageal reflux disease)    Headache    MIGRAINES   Myocardial infarction ([place]) 2007   Non-STEMI   [name] (postoperative nausea and vomiting)    Psoriasis    PUD (peptic ulcer disease)     Past Surgical  History:  Procedure Laterality Date   BACK SURGERY     CHOLECYSTECTOMY     ESOPHAGOGASTRODUODENOSCOPY (EGD) WITH PROPOFOL N/A 03/19/2019   Procedure: ESOPHAGOGASTRODUODENOSCOPY (EGD) WITH PROPOFOL;  Surgeon: [name], MD;  Location: [facility] ENDOSCOPY;  Service: Gastroenterology;  Laterality: N/A;  *Note to anesthesia: Per pt's pulmonologist, if intubating, please extubate to BIPAP.   EYE SURGERY     FOOT SURGERY     INTRAMEDULLARY (IM) NAIL INTERTROCHANTERIC Right 09/30/2018   Procedure: INTRAMEDULLARY (IM) NAIL INTERTROCHANTRIC;  Surgeon: Dereck Leep, MD;  Location: [facility] ORS;  Service: Orthopedics;  Laterality: Right;   KYPHOPLASTY N/A 07/05/2016   Procedure: KYPHOPLASTY T - 9;  Surgeon: Hessie Knows, MD;  Location: [facility] ORS;  Service: Orthopedics;  Laterality: N/A;   KYPHOPLASTY N/A 11/29/2017   Procedure: Iona Hansen;  Surgeon: Hessie Knows, MD;  Location: [facility] ORS;  Service: Orthopedics;  Laterality: N/A;  L2 and L3   KYPHOPLASTY N/A 12/18/2017   Procedure: KYPHOPLASTY L1;  Surgeon: Hessie Knows, MD;  Location: [facility] ORS;  Service: Orthopedics;  Laterality: N/A;   KYPHOPLASTY N/A 01/05/2018   Procedure: KYPHOPLASTY-T11,T12;  Surgeon: Hessie Knows, MD;  Location: [facility] ORS;  Service: Orthopedics;  Laterality: N/A;   KYPHOPLASTY N/A 04/05/2018   Procedure: T10 KYPHOPLASTY;  Surgeon: Hessie Knows, MD;  Location: [facility] ORS;  Service: Orthopedics;  Laterality: N/A;   KYPHOPLASTY N/A 04/12/2018   Procedure: KYPHOPLASTY T7,8;  Surgeon: Hessie Knows, MD;  Location: [facility] ORS;  Service: Orthopedics;  Laterality: N/A;   KYPHOPLASTY N/A 04/19/2018   Procedure: KYPHOPLASTY T5, T6;  Surgeon: Hessie Knows, MD;  Location: [facility] ORS;  Service: Orthopedics;  Laterality: N/A;   LUNG SURGERY  1990 and 1996   THOROCOTOMY WITH LOBECTOMY     LEFT LOWER THORACOTOMY / RIGHT MIDDLE LOBECTOMY     reports that she has never smoked. She has never used smokeless tobacco. She reports that she does not drink  alcohol and does not use drugs.  Allergies  Allergen Reactions   Codeine Nausea And Vomiting   Sulfa Antibiotics Diarrhea   Penicillins Rash    Has patient had a PCN reaction causing immediate rash, facial/tongue/throat swelling, SOB or lightheadedness with hypotension: Unknown Has patient had a PCN reaction causing severe rash involving mucus membranes or skin necrosis: Unknown Has patient had a PCN reaction that required hospitalization: Unknown Has patient had a PCN reaction occurring within the last 10 years: No If all of the above answers are "NO", then may proceed with Cephalosporin use.     Family History  Problem Relation Age of Onset   Hypertension Mother    Hypertension Father      Prior to Admission medications   Medication Sig Start Date End Date Taking? Authorizing Provider  acetaminophen (TYLENOL) 500 MG tablet Take 1,000 mg by mouth every 6 (six) hours as needed for mild pain or headache.     [provider]  acetylcysteine (MUCOMYST) 20 % nebulizer solution Take 4 mLs by nebulization daily. 04/22/20   [provider]  amikacin (AMIKIN) 500 MG/2ML SOLN injection  05/16/20   [provider]  amLODipine (  NORVASC) 2.5 MG tablet Take 2.5 mg by mouth daily. 05/11/20   [provider]  apixaban (ELIQUIS) 5 MG TABS tablet Take 5 mg by mouth 2 (two) times daily.     [provider]  Ascorbic Acid (VITAMIN C) 1000 MG tablet Take 1,000 mg by mouth daily.    [provider]  azithromycin (ZITHROMAX) 250 MG tablet Take 250 mg by mouth daily. 03/25/20   [provider]  benzonatate (TESSALON) 200 MG capsule Take 200 mg by mouth 3 (three) times daily as needed. 04/02/20   [provider]  ciprofloxacin (CIPRO) 500 MG tablet TAKE 1 TABLET(500 MG) BY MOUTH TWICE DAILY 04/16/20 10/13/20  [provider]  digoxin (LANOXIN) 0.125 MG tablet Take 125 mcg by mouth daily. 05/11/20   [provider]  diltiazem  (TIAZAC) 180 MG 24 hr capsule Take by mouth.    [provider]  ferrous sulfate 325 (65 FE) MG EC tablet Take 325 mg by mouth daily.     [provider]  furosemide (LASIX) 20 MG tablet Take 20 mg by mouth daily as needed for edema. 07/08/19   [provider]  gabapentin (NEURONTIN) 300 MG capsule Take 300 mg by mouth 4 (four) times daily.     [provider]  glycopyrrolate (ROBINUL) 1 MG tablet Take 1 tablet by mouth 3 (three) times daily. 01/06/20 01/05/21  [provider]  levalbuterol Penne Lash) 0.31 MG/3ML nebulizer solution Inhale 3 mLs into the lungs every 6 (six) hours as needed. 12/10/19   [provider]  LORazepam (ATIVAN) 0.5 MG tablet Take 1 tablet (0.5 mg total) by mouth at bedtime. 09/10/19   Enzo Bi, MD  losartan (COZAAR) 100 MG tablet Take 100 mg by mouth daily. 07/25/19   [provider]  methylPREDNISolone sodium succinate (SOLU-MEDROL) 40 mg/mL injection SMARTSIG:20 Milligram(s) IV Once 04/28/20   [provider]  metoprolol succinate (TOPROL-XL) 100 MG 24 hr tablet Take 1 tablet (100 mg total) by mouth daily. 10/11/18   Hillary Bow, MD  NAC 600 MG CAPS Take 600 mg by mouth daily after breakfast.  08/17/19   [provider]  omeprazole (PRILOSEC) 20 MG capsule Take 20 mg by mouth daily. 08/26/19   [provider]  polyethylene glycol (MIRALAX / GLYCOLAX) 17 g packet Take 17 g by mouth daily as needed for severe constipation. 05/02/20   Loletha Grayer, MD  potassium chloride (K-DUR) 10 MEQ tablet Take 10 mEq by mouth daily.    [provider]  predniSONE (DELTASONE) 20 MG tablet Take 20 mg by mouth daily. 04/27/20   [provider]  Probiotic Product (ALIGN) 4 MG CAPS Take 4 mg by mouth daily.    [provider]  senna (SENOKOT) 8.6 MG TABS tablet Take 2 tablets (17.2 mg total) by mouth at bedtime. 05/02/20   Loletha Grayer, MD  tobramycin, PF, (TOBI) 300 MG/5ML nebulizer  solution Take 5 mLs by nebulization 2 (two) times daily. 01/10/20   [provider]    Physical Exam: Vitals:   07/14/20 1113 07/14/20 1200 07/14/20 1215 07/14/20 1230  BP: (!) 152/102   (!) 164/71  Pulse:  (!) 134 (!) 125 77  Resp:      Temp:      TempSrc:      SpO2:  97% 98% 97%  Weight: 49.9 kg     Height: _0  (1.651 m)       Constitutional: Awake alert and oriented x3, patient is  exhibiting mild respiratory distress. Skin: notable ecchymosis over the lower extremities.  No rashes, no lesions, good skin turgor noted. Eyes: Pupils are equally reactive to light.  No evidence of scleral icterus or conjunctival pallor.  ENMT: Extremely dry mucous membranes noted.  Posterior pharynx clear of any exudate or lesions.   Neck: normal, supple, no masses, no thyromegaly.  No evidence of jugular venous distension.   Respiratory: Coarse breath sounds bilaterally, worst in the bilateral lower fields with expiratory wheezing noted.  Increased respiratory effort without accessory muscle use.  Cardiovascular: Tachycardic rate with regular rhythm, no murmurs / rubs / gallops.  Pitting bilateral lower extremity edema.  2+ pedal pulses. No carotid bruits.  Chest:   Nontender without crepitus or deformity.   Back:   Nontender without crepitus or deformity. Abdomen: Abdomen is soft and nontender.  No evidence of intra-abdominal masses.  Positive bowel sounds noted in all quadrants.   Musculoskeletal: No joint deformity upper and lower extremities. Good ROM, no contractures. Normal muscle tone.  Neurologic: CN 2-12 grossly intact. Sensation intact.  Patient moving all 4 extremities spontaneously.  Patient is following all commands.  Patient is responsive to verbal stimuli.   Psychiatric: Patient exhibits somewhat anxious mood with appropriate affect.  Patient seems to possess insight as to their current situation.     Labs on Admission: I have personally reviewed following labs and imaging  studies -   CBC: Recent Labs  Lab 07/14/20 1118  WBC 16.3*  HGB 14.2  HCT 40.9  MCV 96.2  PLT 315   Basic Metabolic Panel: Recent Labs  Lab 07/14/20 1118  NA 129*  K 4.1  CL 83*  CO2 37*  GLUCOSE 87  BUN 10  CREATININE 0.58  CALCIUM 9.3   GFR: Estimated Creatinine Clearance: 47.9 mL/min (by C-G formula based on SCr of 0.58 mg/dL). Liver Function Tests: Recent Labs  Lab 07/14/20 1118  AST 30  ALT 20  ALKPHOS 50  BILITOT 0.7  PROT 6.9  ALBUMIN 3.9   No results for input(s): LIPASE, AMYLASE in the last 168 hours. No results for input(s): AMMONIA in the last 168 hours. Coagulation Profile: Recent Labs  Lab 07/14/20 1158  INR 1.0   Cardiac Enzymes: No results for input(s): CKTOTAL, CKMB, CKMBINDEX, TROPONINI in the last 168 hours. BNP (last 3 results) No results for input(s): PROBNP in the last 8760 hours. HbA1C: No results for input(s): HGBA1C in the last 72 hours. CBG: No results for input(s): GLUCAP in the last 168 hours. Lipid Profile: No results for input(s): CHOL, HDL, LDLCALC, TRIG, CHOLHDL, LDLDIRECT in the last 72 hours. Thyroid Function Tests: No results for input(s): TSH, T4TOTAL, FREET4, T3FREE, THYROIDAB in the last 72 hours. Anemia Panel: No results for input(s): VITAMINB12, FOLATE, FERRITIN, TIBC, IRON, RETICCTPCT in the last 72 hours. Urine analysis:    Component Value Date/Time   COLORURINE YELLOW (A) 07/14/2020 1406   APPEARANCEUR CLEAR (A) 07/14/2020 1406   LABSPEC 1.023 07/14/2020 1406   PHURINE 7.0 07/14/2020 1406   GLUCOSEU NEGATIVE 07/14/2020 1406   HGBUR SMALL (A) 07/14/2020 1406   BILIRUBINUR NEGATIVE 07/14/2020 1406   [place] 07/14/2020 1406   PROTEINUR NEGATIVE 07/14/2020 1406   NITRITE NEGATIVE 07/14/2020 1406   LEUKOCYTESUR NEGATIVE 07/14/2020 1406    Radiological Exams on Admission - Personally Reviewed: DG Chest 2 View  Result Date: 07/14/2020 CLINICAL DATA:  75 year old female with shortness of breath  and cough. EXAM: CHEST - 2 VIEW COMPARISON:  Chest CT  04/29/2020 and earlier. FINDINGS: Seated AP and lateral views of the chest. Large chronic left diaphragmatic hernia appears stable. Stable cardiac size and mediastinal contours. Bronchiectasis better demonstrated by CT. Chronic staple line in the right upper lobe. Stable coarse bilateral pulmonary interstitial opacity. No pneumothorax, pulmonary edema, or acute pulmonary opacity. Diffuse spinal compression fractures with at least 10 levels previously augmented. Stable visualized osseous structures. Negative visible bowel gas pattern. IMPRESSION: Chronic lung disease with bronchiectasis. Chronic left diaphragmatic hernia. No acute cardiopulmonary abnormality identified. Electronically Signed   By: Genevie Ann M.D.   On: 07/14/2020 11:54   CT Angio Chest PE W and/or Wo Contrast  Result Date: 07/14/2020 CLINICAL DATA:  PE suspected, high prob EXAM: CT ANGIOGRAPHY CHEST WITH CONTRAST TECHNIQUE: Multidetector CT imaging of the chest was performed using the standard protocol during bolus administration of intravenous contrast. Multiplanar CT image reconstructions and MIPs were obtained to evaluate the vascular anatomy. CONTRAST:  34m OMNIPAQUE IOHEXOL 350 MG/ML SOLN COMPARISON:  Chest CT 04/29/2020 FINDINGS: Cardiovascular: Satisfactory opacification of the pulmonary arteries to the segmental level. No evidence of pulmonary embolism. Unchanged enlarged central pulmonary arteries compatible with pulmonary hypertension. Unchanged mild cardiomegaly. Atherosclerotic calcifications of the aorta. Coronary artery calcifications. Mediastinum/Nodes: There is no mediastinal, hilar, or axillary lymphadenopathy. Thyroid is unremarkable. There is a large hiatal hernia containing the a hepatic flexure of the colon and the stomach, similar to prior exam. Lungs/Pleura: Diffuse bronchial wall thickening with multifocal bronchiectasis, most severe in the right upper lobe and lung bases  bilaterally. Multifocal mucous plugging. Similar architectural distortion in the of the left lower lobe. Prior resections of the lateral segment of the right upper lobe, the right middle lobe, and inferior lingula. There is small right-sided subpleural nodularity, increased from prior exam, largest measuring 2.0 x 0.6 cm, and another measuring 0.9 x 0.8 cm (series 7, images 53 and 41 respectively). Similar subpleural nodularity in the left upper lobe. Scattered lung scarring and tree-in-bud nodularity. Mosaic attenuation bilaterally, likely due to air trapping. Upper Abdomen: Unchanged left renal cyst. Prior cholecystectomy. No acute abnormality. Musculoskeletal: Unchanged multiple thoracolumbar compression deformities with vertebroplasties. Unchanged chronic T3 compression deformity. Partially visualized severe degenerative changes in the lower cervical spine. Review of the MIP images confirms the above findings. IMPRESSION: No evidence of pulmonary embolism. Unchanged enlarged pulmonary arteries suggestive of pulmonary hypertension. 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 Increased subpleural nodularity on the right, which may represent progression of disease. Chronic mosaic attenuation of the lungs likely due to air trapping. Recommend follow-up chest CT in 3 months and follow-up with pulmonology. Aortic Atherosclerosis (ICD10-I70.0). Electronically Signed   By: JMaurine Simmering  On: 07/14/2020 13:33    EKG: Personally reviewed.  Rhythm is sinus tachycardia with heart rate of 123 bpm.  No dynamic ST segment changes appreciated.  Assessment/Plan Principal Problem:   Acute exacerbation of bronchiectasis ([facility]  Patient presenting with increasing shortness of breath, severe wheezing and increasing frequency of cough with copious amounts of green sputum all consistent with acute exacerbation  of patient's longstanding history of chronic bronchiectasis. Presentation is complicated by underlying history of MAC as well as possible contribution of concurrent COVID infection Patient typically takes prednisone 10 mg daily.  We will transition patient to Solu-Medrol 40 mg IV every 12 for now. Patient is typically on chronic therapy with ciprofloxacin and oral azithromycin at home.  ER provider has already given the patient a dose  of cefepime which we will continue for now for pseudomonal coverage and switch patient to intravenous higher dose of azithromycin for the time being. As needed bronchodilator therapy via MDI considering concurrent COVID infection. ER provider has already called and consulted Dr. Lanney Gins who happens to be the patient's pulmonologist in the outpatient setting.  They stated that they will add the patient to the list and come evaluate the patient.  I appreciate the recommendations in advance. Providing patient with submental oxygen as necessary  Active Problems: COVID-19 virus infection  Clinically and concern for ongoing COVID-19 infection COVID-19 PCR testing positive on arrival Patient was initially diagnosed with a home test on 6/5 and therefore with continued respiratory symptoms ongoing respiratory and contact precautions are warranted. Patient reports when she felt to be an allergic reaction to Paxlovid early in the course of her illness and the sensation of her throat swelling up at that time -no observable evidence of a recent allergic reaction on my exam. Will initiate a 5-day course of remdesivir, monitor for tolerance Intravenous steroids as above As needed antitussives Zinc and vitamin C segmentation Bronchodilator therapy via MDI  Lactic acidosis  Unclear as to whether or not this is a type A or type B lactic acidosis Hydrating patient with intravenous isotonic fluids while being mindful of volume overload in this patient with diastolic  dysfunction Performing serial lactic acid levels to ensure downtrending and resolution.  SIRS  Patient is exhibiting tachycardia, leukocytosis and a temperature of 100.3 F. Tachycardia might be secondary to the patient not taking her AV nodal blocking agents this morning and leukocytosis may be secondary to ongoing steroid use.  I therefore do not believe the patient is septic at this time. Continue to monitor these indices closely.     Essential hypertension  Will resume home regimen of diltiazem, metoprolol and losartan    AF (paroxysmal atrial fibrillation) (HCC)  Continue home regimen of digoxin, metoprolol and diltiazem -regimen confirmed per last cardiology note.   Monitoring patient on telemetry Patient is currently exhibiting sinus tachycardia Continue home regimen of Eliquis for anticoagulation    Coronary artery disease involving native coronary artery of native heart without angina pectoris Currently chest pain-free Monitoring on telemetry Slightly elevated troponin likely secondary to supply demand mismatch, unlikely to be secondary to plaque rupture.    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    GERD (gastroesophageal reflux disease)  Continue daily PPI      Code Status:  DNR Family Communication: deferred   Status is: Observation  The patient remains OBS appropriate and will d/c before 2 midnights.  Dispo: The patient is from: Home              Anticipated d/c is to: Home              Patient currently is not medically stable to d/c.   Difficult to place patient No        Vernelle Emerald MD [facility] Pager [phone]  If 7PM-7AM, please contact  night-coverage [email or web address] Use universal [place] password for that web site. If you do not have the password, please call the hospital operator.  07/14/2020, 3:14 PM

## 2020-07-14 NOTE — Progress Notes (Signed)
CODE SEPSIS - PHARMACY COMMUNICATION  **Broad Spectrum Antibiotics should be administered within 1 hour of Sepsis diagnosis**  Time Code Sepsis Called/Page Received: 1233  Antibiotics Ordered: [id number]  Time of 1st antibiotic administration: 1318  Additional action taken by pharmacy: Spoke w/ RN to confirm cefepime balance in ED and triage med stock. Per RN, pt not in the room believe she is in ct or xray  If necessary, Name of Provider/Nurse Contacted: Joneen Caraway ED RN   Lorna Dibble ,PharmD Clinical Pharmacist  07/14/2020  1:08 PM

## 2020-07-14 NOTE — ED Notes (Signed)
Patient currently in XRAY, phlebotomy called for additional labs

## 2020-07-14 NOTE — Sepsis Progress Note (Signed)
Elink monitoring code sepsis 

## 2020-07-14 NOTE — Consult Note (Signed)
Remdesivir - Pharmacy Brief Note   O:  ALT: 30 CXR: Chronic lung disease with bronchiectasis. Chronic left diaphragmatic hernia. No acute cardiopulmonary abnormality identified. SpO2: 98% on 3L [place]   A/P:  Remdesivir 200 mg IVPB once followed by 100 mg IVPB daily x 4 days.   Darnelle Bos, PharmD 07/14/2020 4:02 PM

## 2020-07-14 NOTE — Consult Note (Signed)
Pharmacy Antibiotic Note  Ana Washington is a 75 y.o. female admitted on 07/14/2020 with SOB. Patient tested COVID + 6/5 and 6/12. CT suggestive of atypical mycobacterial airway colonization, with multifocal bronchiectasis, pharmacy has been consulted for cefepime dosing. Patient also started on azithromycin  Plan: Cefepime 1 gram x 1 given in ED, will order another cefepime 1 gram dose x 1 followed by Cefepime 2 gram Q12H per renal function   Height: '5\' 5"'$  (165.1 cm) Weight: 49.9 kg (110 lb) IBW/kg (Calculated) : 57  Temp (24hrs), Avg:100.3 F (37.9 C), Min:100.3 F (37.9 C), Max:100.3 F (37.9 C)  Recent Labs  Lab 07/14/20 1118 07/14/20 1158 07/14/20 1335  WBC 16.3*  --   --   CREATININE 0.58  --   --   LATICACIDVEN  --  2.5* 1.7    Estimated Creatinine Clearance: 47.9 mL/min (by C-G formula based on SCr of 0.58 mg/dL).    Allergies  Allergen Reactions   Codeine Nausea And Vomiting   Sulfa Antibiotics Diarrhea   Penicillins Rash    Has patient had a PCN reaction causing immediate rash, facial/tongue/throat swelling, SOB or lightheadedness with hypotension: Unknown Has patient had a PCN reaction causing severe rash involving mucus membranes or skin necrosis: Unknown Has patient had a PCN reaction that required hospitalization: Unknown Has patient had a PCN reaction occurring within the last 10 years: No If all of the above answers are "NO", then may proceed with Cephalosporin use.     Antimicrobials this admission: 6/14 Vancomycin x 1  6/14 cefepime >>  6/14 azithromycin >>   Dose adjustments this admission: N/a  Microbiology results: 6/14 BCx: sent 6/14 UCx: sent   Thank you for allowing pharmacy to be a part of this patient's care.  Dorothe Pea, PharmD, BCPS Clinical Pharmacist   07/14/2020 3:19 PM

## 2020-07-14 NOTE — ED Triage Notes (Signed)
Pt states she tested positive for covid 6/5 and was feeling fine until the past 24hrs having increased SOB with cough and congestion, has to increase O2 to 3L [place] from her normal 2L [place].   See first nurse note for more infol

## 2020-07-14 NOTE — Consult Note (Signed)
Pulmonary and Critical Care Medicine          Date: 07/14/2020,   MRN# [id number] Ana Washington 09/08/1945     AdmissionWeight: 49.9 kg                 CurrentWeight: 49.9 kg   Referring physician: Sherrie George FNP   CHIEF COMPLAINT:   Acute exacerbation of bronchiectasis due to COVID19    HISTORY OF PRESENT ILLNESS   This is a very pleasant female with hx of permanent AF, severe bronchiectasis chronically with chronic hypoxemia.  She uses Tobramycin nebulized therapy daily and has been on cipro po in the past for >20 years.  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  She is generally on 3L/min nasal canula and is slowly ambulatory.  She had mechanical fall last year and broke her foot which further hindered her functional status but is not demented.    PAST MEDICAL HISTORY   Past Medical History:  Diagnosis Date  . Arthritis   . Atrial fibrillation ([place])   . Bronchiectasis ([place])   . CAD (coronary artery disease) 07/30/2017  . Dumping syndrome   . 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 Family history of adverse reaction to anesthesia    sister [name]  . GERD (gastroesophageal reflux disease)   . Headache    MIGRAINES  . Myocardial infarction ([place]) 2007   Non-STEMI  . [name] (postoperative nausea and vomiting)   . Psoriasis   . PUD (peptic ulcer disease)      SURGICAL HISTORY   Past Surgical History:  Procedure Laterality Date  . BACK SURGERY    . CHOLECYSTECTOMY    . ESOPHAGOGASTRODUODENOSCOPY (EGD) WITH PROPOFOL N/A 03/19/2019   Procedure: ESOPHAGOGASTRODUODENOSCOPY (EGD) WITH PROPOFOL;  Surgeon: [name], MD;  Location: [facility] ENDOSCOPY;  Service: Gastroenterology;  Laterality: N/A;  *Note to anesthesia: Per pt's pulmonologist, if intubating, please extubate to BIPAP.  [name] EYE SURGERY    . FOOT SURGERY    . INTRAMEDULLARY (IM) NAIL INTERTROCHANTERIC Right 09/30/2018    Procedure: INTRAMEDULLARY (IM) NAIL INTERTROCHANTRIC;  Surgeon: Dereck Leep, MD;  Location: [facility] ORS;  Service: Orthopedics;  Laterality: Right;  . KYPHOPLASTY N/A 07/05/2016   Procedure: KYPHOPLASTY T - 9;  Surgeon: Hessie Knows, MD;  Location: [facility] ORS;  Service: Orthopedics;  Laterality: N/A;  . KYPHOPLASTY N/A 11/29/2017   Procedure: Iona Hansen;  Surgeon: Hessie Knows, MD;  Location: [facility] ORS;  Service: Orthopedics;  Laterality: N/A;  L2 and L3  . KYPHOPLASTY N/A 12/18/2017   Procedure: KYPHOPLASTY L1;  Surgeon: Hessie Knows, MD;  Location: [facility] ORS;  Service: Orthopedics;  Laterality: N/A;  . KYPHOPLASTY N/A 01/05/2018   Procedure: KYPHOPLASTY-T11,T12;  Surgeon: Hessie Knows, MD;  Location: [facility] ORS;  Service: Orthopedics;  Laterality: N/A;  . KYPHOPLASTY N/A 04/05/2018   Procedure: T10 KYPHOPLASTY;  Surgeon: Hessie Knows, MD;  Location: [facility] ORS;  Service: Orthopedics;  Laterality: N/A;  . KYPHOPLASTY N/A 04/12/2018   Procedure: KYPHOPLASTY T7,8;  Surgeon: Hessie Knows, MD;  Location: [facility] ORS;  Service: Orthopedics;  Laterality: N/A;  . KYPHOPLASTY N/A 04/19/2018   Procedure: KYPHOPLASTY T5, T6;  Surgeon: Hessie Knows, MD;  Location: [facility] ORS;  Service: Orthopedics;  Laterality: N/A;  . LUNG SURGERY  1990 and 1996  . THOROCOTOMY WITH LOBECTOMY     LEFT LOWER THORACOTOMY / RIGHT MIDDLE LOBECTOMY     FAMILY HISTORY   Family History  Problem Relation  Age of Onset  . Hypertension Mother   . Hypertension Father      SOCIAL HISTORY   Social History   Tobacco Use  . Smoking status: Never  . Smokeless tobacco: Never  Vaping Use  . Vaping Use: Never used  Substance Use Topics  . Alcohol use: No  . Drug use: No     MEDICATIONS    Home Medication:  Current Outpatient Rx  . Order #: [id number] Class: Historical Med  . Order #: [id number] Class: Historical Med  . Order #: [id number] Class: Historical Med  . Order #: [id number] Class: Historical Med  . Order #:  [id number] Class: Historical Med  . Order #: [id number] Class: Historical Med  . Order #: [id number] Class: Historical Med  . Order #: [id number] Class: Historical Med  . Order #: [id number] Class: Historical Med  . Order #: [id number] Class: Historical Med  . Order #: [id number] Class: Historical Med  . Order #: [id number] Class: Historical Med  . Order #: [id number] Class: Historical Med  . Order #: [id number] Class: Historical Med  . Order #: [id number] Class: Historical Med  . Order #: [id number] Class: Historical Med  . Order #: [id number] Class: No Print  . Order #: [id number] Class: Historical Med  . Order #: [id number] Class: Historical Med  . Order #: [id number] Class: No Print  . Order #: [id number] Class: Historical Med  . Order #: [id number] Class: Historical Med  . Order #: [id number] Class: Normal  . Order #: [id number] Class: Historical Med  . Order #: [id number] Class: Historical Med  . Order #: [id number] Class: Historical Med  . Order #: [id number] Class: Normal  . Order #: [id number] Class: Historical Med    Current Medication:  Current Facility-Administered Medications:  .  acetaminophen (TYLENOL) tablet 650 mg, 650 mg, Oral, Q6H PRN **OR** acetaminophen (TYLENOL) suppository 650 mg, 650 mg, Rectal, Q6H PRN, Shalhoub, Sherryll Burger, MD .  Derrill Memo ON 07/15/2020] acetylcysteine (MUCOMYST) 20 % nebulizer / oral solution 4 mL, 4 mL, Nebulization, Daily, Shalhoub, Sherryll Burger, MD .  Align CAPS 4 mg, 4 mg, Oral, Daily, Shalhoub, Sherryll Burger, MD .  apixaban (ELIQUIS) tablet 5 mg, 5 mg, Oral, BID, Shalhoub, Sherryll Burger, MD .  azithromycin (ZITHROMAX) 500 mg in sodium chloride 0.9 % 250 mL IVPB, 500 mg, Intravenous, Q24H, Shalhoub, Sherryll Burger, MD .  benzonatate (TESSALON) capsule 200 mg, 200 mg, Oral, TID PRN, Shalhoub, Sherryll Burger, MD .  ceFEPIme (MAXIPIME) 1 g in sodium chloride 0.9 % 100 mL IVPB, 1 g, Intravenous, Once, Last Rate: 200 mL/hr at 07/14/20 1522, 1 g at 07/14/20 1522 **FOLLOWED BY** [START ON 07/15/2020] ceFEPIme (MAXIPIME) 2  g in sodium chloride 0.9 % 100 mL IVPB, 2 g, Intravenous, Q12H, Dorothe Pea, RPH .  [START ON 07/15/2020] digoxin (LANOXIN) tablet 125 mcg, 125 mcg, Oral, Daily, Shalhoub, Sherryll Burger, MD .  Derrill Memo ON 07/15/2020] diltiazem [facility]) 24 hr capsule 180 mg, 180 mg, Oral, Daily, Shalhoub, Sherryll Burger, MD .  Derrill Memo ON 07/15/2020] ferrous sulfate EC tablet 325 mg, 325 mg, Oral, Daily, Shalhoub, Sherryll Burger, MD .  glycopyrrolate (ROBINUL) tablet 1 mg, 1 mg, Oral, TID, Shalhoub, Sherryll Burger, MD .  lactated ringers infusion, , Intravenous, Continuous, Shalhoub, Sherryll Burger, MD .  levalbuterol (XOPENEX HFA) inhaler 2 puff, 2 puff, Inhalation, Q6H, Shalhoub, Sherryll Burger, MD .  levalbuterol (XOPENEX HFA) inhaler 2 puff, 2 puff, Inhalation, Q2H PRN, Shalhoub, Sherryll Burger, MD .  LORazepam (ATIVAN) tablet 0.5 mg, 0.5 mg, Oral, QHS, Shalhoub, Sherryll Burger, MD .  Derrill Memo ON 07/15/2020] losartan (COZAAR) tablet 100 mg, 100 mg, Oral, Daily, Shalhoub, Iona Beard  J, MD .  methylPREDNISolone sodium succinate (SOLU-MEDROL) 40 mg/mL injection 40 mg, 40 mg, Intravenous, Q12H, Shalhoub, Sherryll Burger, MD, 40 mg at 07/14/20 1519 .  [START ON 07/15/2020] metoprolol succinate (TOPROL-XL) 24 hr tablet 100 mg, 100 mg, Oral, Daily, Shalhoub, Sherryll Burger, MD .  ondansetron (ZOFRAN) tablet 4 mg, 4 mg, Oral, Q6H PRN **OR** ondansetron (ZOFRAN) injection 4 mg, 4 mg, Intravenous, Q6H PRN, Shalhoub, Sherryll Burger, MD .  pantoprazole (PROTONIX) EC tablet 40 mg, 40 mg, Oral, Daily, Shalhoub, Sherryll Burger, MD .  polyethylene glycol (MIRALAX / GLYCOLAX) packet 17 g, 17 g, Oral, Daily PRN, Shalhoub, Sherryll Burger, MD .  senna (SENOKOT) tablet 17.2 mg, 2 tablet, Oral, QHS, Shalhoub, Sherryll Burger, MD  Current Outpatient Medications:  .  acetaminophen (TYLENOL) 500 MG tablet, Take 1,000 mg by mouth every 6 (six) hours as needed for mild pain or headache. , Disp: , Rfl:  .  acetylcysteine (MUCOMYST) 20 % nebulizer solution, Take 4 mLs by nebulization daily., Disp: , Rfl:  .  amikacin (AMIKIN) 500  MG/2ML SOLN injection, , Disp: , Rfl:  .  amLODipine (NORVASC) 2.5 MG tablet, Take 2.5 mg by mouth daily., Disp: , Rfl:  .  apixaban (ELIQUIS) 5 MG TABS tablet, Take 5 mg by mouth 2 (two) times daily. , Disp: , Rfl:  .  Ascorbic Acid (VITAMIN C) 1000 MG tablet, Take 1,000 mg by mouth daily., Disp: , Rfl:  .  azithromycin (ZITHROMAX) 250 MG tablet, Take 250 mg by mouth daily., Disp: , Rfl:  .  benzonatate (TESSALON) 200 MG capsule, Take 200 mg by mouth 3 (three) times daily as needed., Disp: , Rfl:  .  ciprofloxacin (CIPRO) 500 MG tablet, TAKE 1 TABLET(500 MG) BY MOUTH TWICE DAILY, Disp: , Rfl:  .  digoxin (LANOXIN) 0.125 MG tablet, Take 125 mcg by mouth daily., Disp: , Rfl:  .  diltiazem (TIAZAC) 180 MG 24 hr capsule, Take by mouth., Disp: , Rfl:  .  ferrous sulfate 325 (65 FE) MG EC tablet, Take 325 mg by mouth daily. , Disp: , Rfl:  .  furosemide (LASIX) 20 MG tablet, Take 20 mg by mouth daily as needed for edema., Disp: , Rfl:  .  gabapentin (NEURONTIN) 300 MG capsule, Take 300 mg by mouth 4 (four) times daily. , Disp: , Rfl:  .  glycopyrrolate (ROBINUL) 1 MG tablet, Take 1 tablet by mouth 3 (three) times daily., Disp: , Rfl:  .  levalbuterol (XOPENEX) 0.31 MG/3ML nebulizer solution, Inhale 3 mLs into the lungs every 6 (six) hours as needed., Disp: , Rfl:  .  LORazepam (ATIVAN) 0.5 MG tablet, Take 1 tablet (0.5 mg total) by mouth at bedtime., Disp: , Rfl:  .  losartan (COZAAR) 100 MG tablet, Take 100 mg by mouth daily., Disp: , Rfl:  .  methylPREDNISolone sodium succinate (SOLU-MEDROL) 40 mg/mL injection, SMARTSIG:20 Milligram(s) IV Once, Disp: , Rfl:  .  metoprolol succinate (TOPROL-XL) 100 MG 24 hr tablet, Take 1 tablet (100 mg total) by mouth daily., Disp: , Rfl:  .  NAC 600 MG CAPS, Take 600 mg by mouth daily after breakfast. , Disp: , Rfl:  .  omeprazole (PRILOSEC) 20 MG capsule, Take 20 mg by mouth daily., Disp: , Rfl:  .  polyethylene glycol (MIRALAX / GLYCOLAX) 17 g packet, Take 17 g  by mouth daily as needed for severe constipation., Disp: 30 each, Rfl: 0 .  potassium chloride (K-DUR) 10 MEQ tablet, Take 10 mEq by mouth daily.,  Disp: , Rfl:  .  predniSONE (DELTASONE) 20 MG tablet, Take 20 mg by mouth daily., Disp: , Rfl:  .  Probiotic Product (ALIGN) 4 MG CAPS, Take 4 mg by mouth daily., Disp: , Rfl:  .  senna (SENOKOT) 8.6 MG TABS tablet, Take 2 tablets (17.2 mg total) by mouth at bedtime., Disp: 60 tablet, Rfl: 0 .  tobramycin, PF, (TOBI) 300 MG/5ML nebulizer solution, Take 5 mLs by nebulization 2 (two) times daily., Disp: , Rfl:     ALLERGIES   Codeine, Sulfa antibiotics, and Penicillins     REVIEW OF SYSTEMS    Review of Systems:  Gen:  Denies  fever, sweats, chills weigh loss  HEENT: Denies blurred vision, double vision, ear pain, eye pain, hearing loss, nose bleeds, sore throat Cardiac:  No dizziness, chest pain or heaviness, chest tightness,edema Resp:   Denies cough or sputum porduction, shortness of breath,wheezing, hemoptysis,  Gi: Denies swallowing difficulty, stomach pain, nausea or vomiting, diarrhea, constipation, bowel incontinence Gu:  Denies bladder incontinence, burning urine Ext:   Denies Joint pain, stiffness or swelling Skin: Denies  skin rash, easy bruising or bleeding or hives Endoc:  Denies polyuria, polydipsia , polyphagia or weight change Psych:   Denies depression, insomnia or hallucinations   Other:  All other systems negative   VS: BP (!) 164/71   Pulse 77   Temp 100.3 F (37.9 C) (Oral)   Resp (!) 30   Ht '5\' 5"'$  (1.651 m)   Wt 49.9 kg   SpO2 97%   BMI 18.30 kg/m      PHYSICAL EXAM    GENERAL:NAD, no fevers, chills, no weakness no fatigue HEAD: Normocephalic, atraumatic.  EYES: Pupils equal, round, reactive to light. Extraocular muscles intact. No scleral icterus.  MOUTH: Moist mucosal membrane. Dentition intact. No abscess noted.  EAR, NOSE, THROAT: Clear without exudates. No external lesions.  NECK: Supple. No  thyromegaly. No nodules. No JVD.  PULMONARY: rhonchi bilaterally  CARDIOVASCULAR: S1 and S2. Regular rate and rhythm. No murmurs, rubs, or gallops. No edema. Pedal pulses 2+ bilaterally.  GASTROINTESTINAL: Soft, nontender, nondistended. No masses. Positive bowel sounds. No hepatosplenomegaly.  MUSCULOSKELETAL: No swelling, clubbing, or edema. Range of motion full in all extremities.  NEUROLOGIC: Cranial nerves II through XII are intact. No gross focal neurological deficits. Sensation intact. Reflexes intact.  SKIN: No ulceration, lesions, rashes, or cyanosis. Skin warm and dry. Turgor intact.  PSYCHIATRIC: Mood, affect within normal limits. The patient is awake, alert and oriented x 3. Insight, judgment intact.       IMAGING    DG Chest 2 View  Result Date: 07/14/2020 CLINICAL DATA:  75 year old female with shortness of breath and cough. EXAM: CHEST - 2 VIEW COMPARISON:  Chest CT 04/29/2020 and earlier. FINDINGS: Seated AP and lateral views of the chest. Large chronic left diaphragmatic hernia appears stable. Stable cardiac size and mediastinal contours. Bronchiectasis better demonstrated by CT. Chronic staple line in the right upper lobe. Stable coarse bilateral pulmonary interstitial opacity. No pneumothorax, pulmonary edema, or acute pulmonary opacity. Diffuse spinal compression fractures with at least 10 levels previously augmented. Stable visualized osseous structures. Negative visible bowel gas pattern. IMPRESSION: Chronic lung disease with bronchiectasis. Chronic left diaphragmatic hernia. No acute cardiopulmonary abnormality identified. Electronically Signed   By: Genevie Ann M.D.   On: 07/14/2020 11:54   CT Angio Chest PE W and/or Wo Contrast  Result Date: 07/14/2020 CLINICAL DATA:  PE suspected, high prob EXAM: CT ANGIOGRAPHY CHEST WITH CONTRAST  TECHNIQUE: Multidetector CT imaging of the chest was performed using the standard protocol during bolus administration of intravenous contrast.  Multiplanar CT image reconstructions and MIPs were obtained to evaluate the vascular anatomy. CONTRAST:  9m OMNIPAQUE IOHEXOL 350 MG/ML SOLN COMPARISON:  Chest CT 04/29/2020 FINDINGS: Cardiovascular: Satisfactory opacification of the pulmonary arteries to the segmental level. No evidence of pulmonary embolism. Unchanged enlarged central pulmonary arteries compatible with pulmonary hypertension. Unchanged mild cardiomegaly. Atherosclerotic calcifications of the aorta. Coronary artery calcifications. Mediastinum/Nodes: There is no mediastinal, hilar, or axillary lymphadenopathy. Thyroid is unremarkable. There is a large hiatal hernia containing the a hepatic flexure of the colon and the stomach, similar to prior exam. Lungs/Pleura: Diffuse bronchial wall thickening with multifocal bronchiectasis, most severe in the right upper lobe and lung bases bilaterally. Multifocal mucous plugging. Similar architectural distortion in the of the left lower lobe. Prior resections of the lateral segment of the right upper lobe, the right middle lobe, and inferior lingula. There is small right-sided subpleural nodularity, increased from prior exam, largest measuring 2.0 x 0.6 cm, and another measuring 0.9 x 0.8 cm (series 7, images 53 and 41 respectively). Similar subpleural nodularity in the left upper lobe. Scattered lung scarring and tree-in-bud nodularity. Mosaic attenuation bilaterally, likely due to air trapping. Upper Abdomen: Unchanged left renal cyst. Prior cholecystectomy. No acute abnormality. Musculoskeletal: Unchanged multiple thoracolumbar compression deformities with vertebroplasties. Unchanged chronic T3 compression deformity. Partially visualized severe degenerative changes in the lower cervical spine. Review of the MIP images confirms the above findings. IMPRESSION: No evidence of pulmonary embolism. Unchanged enlarged pulmonary arteries suggestive of pulmonary hypertension. 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 Increased subpleural nodularity on the right, which may represent progression of disease. Chronic mosaic attenuation of the lungs likely due to air trapping. Recommend follow-up chest CT in 3 months and follow-up with pulmonology. Aortic Atherosclerosis (ICD10-I70.0). Electronically Signed   By: JMaurine Simmering  On: 07/14/2020 13:33      ASSESSMENT/PLAN    Acute exacerbation of bronchiectasis  - empirically on zithromax and cefepime for possible sepsis    - agree with current antibiotics    Acute COVID19 pneumonia -Remdesevir antiviral - pharmacy protocol 5 d -vitamin C -zinc -decadron '6mg'$  IV daily  -- monitor UOP - utilize external urinary catheter if possible -encourage to use IS and Acapella device for bronchopulmonary hygiene when able -d/c hepatotoxic medications while on remdesevir -supportive care with ICU telemetry monitoring -PT/OT when possible -procalcitonin, CRP and ferritin trending -pt s/p Paxlovid therapy    Chronic AF    - continue eliquis - no hemoptysis Digoxin    - currently rate controlled with BB and CchB       Thank you for allowing me to participate in the care of this patient.   Patient/Family are satisfied with care plan and all questions have been answered.   This document was prepared using Dragon voice recognition software and may include unintentional dictation errors.     FOttie Glazier M.D.  Div[facility] of [place]

## 2020-07-14 NOTE — Progress Notes (Signed)
PHARMACY -  BRIEF ANTIBIOTIC NOTE   Pharmacy has received consult(s) for Vanc/Aztreonam from an ED provider.  The patient's profile has been reviewed for ht/wt/allergies/indication/available labs.    One time order(s) placed for:  Vanc 1g x1 ordered (~'20mg'$ /kg) loading dose Aztreonam substituted with Cefepime 1g x1 based on history of tolerating Ancef (12/2017), Ceftriaxone (09/2018), Cefepime (09/2019)  Further antibiotics/pharmacy consults should be ordered by admitting physician if indicated.                       Thank you, Lorna Dibble 07/14/2020  1:03 PM

## 2020-07-14 NOTE — ED Notes (Signed)
Phlebotomy at bedside.

## 2020-07-15 DIAGNOSIS — J9610 Chronic respiratory failure, unspecified whether with hypoxia or hypercapnia: Secondary | ICD-10-CM | POA: Diagnosis present

## 2020-07-15 DIAGNOSIS — Z79899 Other long term (current) drug therapy: Secondary | ICD-10-CM | POA: Diagnosis not present

## 2020-07-15 DIAGNOSIS — E872 Acidosis: Secondary | ICD-10-CM | POA: Diagnosis present

## 2020-07-15 DIAGNOSIS — E871 Hypo-osmolality and hyponatremia: Secondary | ICD-10-CM | POA: Diagnosis present

## 2020-07-15 DIAGNOSIS — J470 Bronchiectasis with acute lower respiratory infection: Secondary | ICD-10-CM | POA: Diagnosis present

## 2020-07-15 DIAGNOSIS — J471 Bronchiectasis with (acute) exacerbation: Secondary | ICD-10-CM | POA: Diagnosis present

## 2020-07-15 DIAGNOSIS — N390 Urinary tract infection, site not specified: Secondary | ICD-10-CM | POA: Diagnosis present

## 2020-07-15 DIAGNOSIS — I248 Other forms of acute ischemic heart disease: Secondary | ICD-10-CM | POA: Diagnosis present

## 2020-07-15 DIAGNOSIS — Z66 Do not resuscitate: Secondary | ICD-10-CM | POA: Diagnosis present

## 2020-07-15 DIAGNOSIS — B370 Candidal stomatitis: Secondary | ICD-10-CM | POA: Diagnosis present

## 2020-07-15 DIAGNOSIS — A4189 Other specified sepsis: Secondary | ICD-10-CM | POA: Diagnosis present

## 2020-07-15 DIAGNOSIS — I4821 Permanent atrial fibrillation: Secondary | ICD-10-CM | POA: Diagnosis present

## 2020-07-15 DIAGNOSIS — I252 Old myocardial infarction: Secondary | ICD-10-CM | POA: Diagnosis not present

## 2020-07-15 DIAGNOSIS — A310 Pulmonary mycobacterial infection: Secondary | ICD-10-CM | POA: Diagnosis present

## 2020-07-15 DIAGNOSIS — I2510 Atherosclerotic heart disease of native coronary artery without angina pectoris: Secondary | ICD-10-CM | POA: Diagnosis present

## 2020-07-15 DIAGNOSIS — J1282 Pneumonia due to coronavirus disease 2019: Secondary | ICD-10-CM | POA: Diagnosis present

## 2020-07-15 DIAGNOSIS — I5032 Chronic diastolic (congestive) heart failure: Secondary | ICD-10-CM | POA: Diagnosis present

## 2020-07-15 DIAGNOSIS — Z1612 Extended spectrum beta lactamase (ESBL) resistance: Secondary | ICD-10-CM | POA: Diagnosis present

## 2020-07-15 DIAGNOSIS — Z9049 Acquired absence of other specified parts of digestive tract: Secondary | ICD-10-CM | POA: Diagnosis not present

## 2020-07-15 DIAGNOSIS — K219 Gastro-esophageal reflux disease without esophagitis: Secondary | ICD-10-CM | POA: Diagnosis present

## 2020-07-15 DIAGNOSIS — I110 Hypertensive heart disease with heart failure: Secondary | ICD-10-CM | POA: Diagnosis present

## 2020-07-15 DIAGNOSIS — U071 COVID-19: Secondary | ICD-10-CM | POA: Diagnosis present

## 2020-07-15 DIAGNOSIS — Z8711 Personal history of peptic ulcer disease: Secondary | ICD-10-CM | POA: Diagnosis not present

## 2020-07-15 DIAGNOSIS — Z7901 Long term (current) use of anticoagulants: Secondary | ICD-10-CM | POA: Diagnosis not present

## 2020-07-15 LAB — CBC WITH DIFFERENTIAL/PLATELET
Abs Immature Granulocytes: 0.16 10*3/uL — ABNORMAL HIGH (ref 0.00–0.07)
Basophils Absolute: 0 10*3/uL (ref 0.0–0.1)
Basophils Relative: 0 %
Eosinophils Absolute: 0 10*3/uL (ref 0.0–0.5)
Eosinophils Relative: 0 %
HCT: 35 % — ABNORMAL LOW (ref 36.0–46.0)
Hemoglobin: 12.3 g/dL (ref 12.0–15.0)
Immature Granulocytes: 1 %
Lymphocytes Relative: 5 %
Lymphs Abs: 0.6 10*3/uL — ABNORMAL LOW (ref 0.7–4.0)
MCH: 32.7 pg (ref 26.0–34.0)
MCHC: 35.1 g/dL (ref 30.0–36.0)
MCV: 93.1 fL (ref 80.0–100.0)
Monocytes Absolute: 0.3 10*3/uL (ref 0.1–1.0)
Monocytes Relative: 2 %
Neutro Abs: 10.7 10*3/uL — ABNORMAL HIGH (ref 1.7–7.7)
Neutrophils Relative %: 92 %
Platelets: 268 10*3/uL (ref 150–400)
RBC: 3.76 MIL/uL — ABNORMAL LOW (ref 3.87–5.11)
RDW: 11.9 % (ref 11.5–15.5)
WBC: 11.7 10*3/uL — ABNORMAL HIGH (ref 4.0–10.5)
nRBC: 0 % (ref 0.0–0.2)

## 2020-07-15 LAB — MAGNESIUM: Magnesium: 1.7 mg/dL (ref 1.7–2.4)

## 2020-07-15 LAB — COMPREHENSIVE METABOLIC PANEL
ALT: 17 U/L (ref 0–44)
AST: 27 U/L (ref 15–41)
Albumin: 2.5 g/dL — ABNORMAL LOW (ref 3.5–5.0)
Alkaline Phosphatase: 36 U/L — ABNORMAL LOW (ref 38–126)
Anion gap: 9 (ref 5–15)
BUN: 11 mg/dL (ref 8–23)
CO2: 34 mmol/L — ABNORMAL HIGH (ref 22–32)
Calcium: 7.7 mg/dL — ABNORMAL LOW (ref 8.9–10.3)
Chloride: 85 mmol/L — ABNORMAL LOW (ref 98–111)
Creatinine, Ser: 0.48 mg/dL (ref 0.44–1.00)
GFR, Estimated: >60 mL/min (ref >60)
Glucose, Bld: 112 mg/dL — ABNORMAL HIGH (ref 70–99)
Potassium: 3.5 mmol/L (ref 3.5–5.1)
Sodium: 128 mmol/L — ABNORMAL LOW (ref 135–145)
Total Bilirubin: 0.6 mg/dL (ref 0.3–1.2)
Total Protein: 5.2 g/dL — ABNORMAL LOW (ref 6.5–8.1)

## 2020-07-15 MED ORDER — BISACODYL 10 MG RE SUPP
10.0000 mg | Freq: Once | RECTAL | Status: DC
  Filled 2020-07-15 (×2): qty 1

## 2020-07-15 MED ORDER — CALCIUM CARBONATE ANTACID 500 MG PO CHEW
1.0000 | CHEWABLE_TABLET | Freq: Three times a day (TID) | ORAL | Status: DC | PRN
  Administered 2020-07-15: 200 mg via ORAL
  Filled 2020-07-15: qty 1

## 2020-07-15 MED ORDER — ADULT MULTIVITAMIN W/MINERALS CH
1.0000 | ORAL_TABLET | Freq: Every day | ORAL | Status: DC
  Administered 2020-07-15 – 2020-07-20 (×6): 1 via ORAL
  Filled 2020-07-15 (×6): qty 1

## 2020-07-15 NOTE — Progress Notes (Signed)
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 Continue on IV steroids, azithromycin, cefepime and bronchodilators. Encourage incentive spirometry. Pulmon following and recs apprec  COVID19 infection: continue on IV remdesivir, IV steroids and bronchodilators. Had an allergic reaction to paxlovid early in her course of illness & sensation of her throat swelling up at that time   Possibly dysphagia: pt c/o difficulty swallowing. Speech consulted   Hyponatremia: labile. Will continue to monitor   Lactic acidosis: resolved  Sepsis: met criteria w/ tachycardia, luekocytosis, fever & COVID19 infection  HTN: continue on home dose of diltiazem, metoprolol, losartan  PAF: continue on home dose of metoprolol, diltiazem, & elquis   Hx of CAD: w/ minimally elevated troponins likely secondary to demand ischemia  MAC: continue on IV azithromycin, cefepime. Pulmon following and recs   GERD: continue on PPI    DVT prophylaxis: eliquis  Code Status: DNR  Family Communication:  Disposition Plan: depends on PT/OT recs   Level of care: Med-Surg  Status is: Inpatient  Remains inpatient appropriate because:Unsafe d/c plan, IV treatments appropriate due to intensity of illness or  inability to take PO, and Inpatient level of care appropriate due to severity of illness  Dispo: The patient is from: Home              Anticipated d/c is to: Home vs SNF               Patient currently is not medically stable to d/c.   Difficult to place patient: unclear       Consultants:  Pulmon   Procedures:   Antimicrobials: azithromycin, cefepime    Subjective: Pt c/o shortness of breath   Objective: Vitals:   07/14/20 2022 07/14/20 2128 07/14/20 2342 07/15/20 0445  BP:  (!) 106/59 (!) 155/87 (!) 142/85  Pulse: 78 72 72 62  Resp: (!) _0 Temp: 98.1 F (36.7 C) 97.8 F (36.6 C) 98.2 F (36.8 C) 97.6 F (36.4 C)  TempSrc: Axillary Oral Oral   SpO2: 94% 97% 96% 98%  Weight:      Height:        Intake/Output Summary (Last 24 hours) at 07/15/2020 0748 Last data filed at 07/14/2020 2258 Gross per 24 hour  Intake 0 ml  Output 0 ml  Net 0 ml   Filed Weights   07/14/20 1113  Weight: 49.9 kg    Examination:  General exam: Appears calm and comfortable  Respiratory system: diminished breath sounds b/l  Cardiovascular system: S1 & S2 +. No rubs, gallops or clicks.  Gastrointestinal system: Abdomen is nondistended, soft and nontender. Normal bowel sounds heard. Central nervous system: Alert and oriented. Moves all extremities  Psychiatry: Judgement and insight appear normal. Flat mood and affect.     Data Reviewed: I have personally reviewed following labs and imaging  studies  CBC: Recent Labs  Lab 07/14/20 1118 07/15/20 0638  WBC 16.3* 11.7*  NEUTROABS  --  10.7*  HGB 14.2 12.3  HCT 40.9 35.0*  MCV 96.2 93.1  PLT 349 474   Basic Metabolic Panel: Recent Labs  Lab 07/14/20 1118 07/15/20 0638  NA 129* 128*  K 4.1 3.5  CL 83* 85*  CO2 37* 34*  GLUCOSE 87 112*  BUN 10 11  CREATININE 0.58 0.48  CALCIUM 9.3 7.7*  MG  --  1.7   GFR: Estimated Creatinine Clearance: 47.9 mL/min (by C-G formula based on SCr of 0.48 mg/dL). Liver  Function Tests: Recent Labs  Lab 07/14/20 1118 07/15/20 0638  AST 30 27  ALT 20 17  ALKPHOS 50 36*  BILITOT 0.7 0.6  PROT 6.9 5.2*  ALBUMIN 3.9 2.5*   No results for input(s): LIPASE, AMYLASE in the last 168 hours. No results for input(s): AMMONIA in the last 168 hours. Coagulation Profile: Recent Labs  Lab 07/14/20 1158  INR 1.0   Cardiac Enzymes: No results for input(s): CKTOTAL, CKMB, CKMBINDEX, TROPONINI in the last 168 hours. BNP (last 3 results) No results for input(s): PROBNP in the last 8760 hours. HbA1C: No results for input(s): HGBA1C in the last 72 hours. CBG: No results for input(s): GLUCAP in the last 168 hours. Lipid Profile: No results for input(s): CHOL, HDL, LDLCALC, TRIG, CHOLHDL, LDLDIRECT in the last 72 hours. Thyroid Function Tests: No results for input(s): TSH, T4TOTAL, FREET4, T3FREE, THYROIDAB in the last 72 hours. Anemia Panel: No results for input(s): VITAMINB12, FOLATE, FERRITIN, TIBC, IRON, RETICCTPCT in the last 72 hours. Sepsis Labs: Recent Labs  Lab 07/14/20 1158 07/14/20 1335  PROCALCITON <0.10  --   LATICACIDVEN 2.5* 1.7    Recent Results (from the past 240 hour(s))  Blood culture (routine x 2)     Status: None (Preliminary result)   Collection Time: 07/14/20 11:58 AM   Specimen: BLOOD  Result Value Ref Range Status   Specimen Description BLOOD LEFT ANTECUBITAL  Final   Special Requests   Final    BOTTLES DRAWN AEROBIC AND ANAEROBIC Blood Culture adequate volume   Culture   Final    NO GROWTH < 24 HOURS Performed at [facility], [address] [place], [place] [address]    Report Status PENDING  Incomplete  Blood culture (routine x 2)     Status: None (Preliminary result)   Collection Time: 07/14/20 12:17 PM   Specimen: BLOOD  Result Value Ref Range Status   Specimen Description BLOOD BLOOD LEFT ARM  Final   Special Requests   Final    BOTTLES DRAWN AEROBIC AND ANAEROBIC Blood Culture adequate volume   Culture    Final    NO GROWTH < 24 HOURS Performed at [facility], [address] [place],  [address]    Report Status PENDING  Incomplete  Resp Panel by RT-PCR (Flu A&B, Covid) Urine, Clean Catch     Status: Abnormal   Collection Time: 07/14/20  2:03 PM   Specimen: Urine, Clean Catch; Nasopharyngeal(NP) swabs in vial transport medium  Result Value Ref Range Status   SARS Coronavirus 2 by RT PCR POSITIVE (A) NEGATIVE Final    Comment: RESULT CALLED TO, READ BACK BY AND VERIFIED WITH: ZACK REGISTER _0  07/14/20 MJU (NOTE) SARS-CoV-2 target nucleic acids are DETECTED.  The SARS-CoV-2 RNA is generally detectable in upper respiratory specimens during the acute phase of infection. Positive results are indicative of the  presence of the identified virus, but do not rule out bacterial infection or co-infection with other pathogens not detected by the test. Clinical correlation with patient history and other diagnostic information is necessary to determine patient infection status. The expected result is Negative.  Fact Sheet for Patients: EntrepreneurPulse.com.au  Fact Sheet for Healthcare Providers: IncredibleEmployment.be  This test is not yet approved or cleared by the [place] FDA and  has been authorized for detection and/or diagnosis of SARS-CoV-2 by FDA under an Emergency Use Authorization (EUA).  This EUA will remain in effect (meaning this test can be u sed) for the duration of  the COVID-19 declaration under Section 564(b)(1) of the Act, 21 U.S.C. section 360bbb-3(b)(1), unless the authorization is terminated or revoked sooner.     Influenza A by PCR NEGATIVE NEGATIVE Final   Influenza B by PCR NEGATIVE NEGATIVE Final    Comment: (NOTE) The Xpert Xpress SARS-CoV-2/FLU/RSV plus assay is intended as an aid in the diagnosis of influenza from Nasopharyngeal swab specimens and should not be used as a sole basis for treatment.  Nasal washings and aspirates are unacceptable for Xpert Xpress SARS-CoV-2/FLU/RSV testing.  Fact Sheet for Patients: EntrepreneurPulse.com.au  Fact Sheet for Healthcare Providers: IncredibleEmployment.be  This test is not yet approved or cleared by the [place] FDA and has been authorized for detection and/or diagnosis of SARS-CoV-2 by FDA under an Emergency Use Authorization (EUA). This EUA will remain in effect (meaning this test can be used) for the duration of the COVID-19 declaration under Section 564(b)(1) of the Act, 21 U.S.C. section 360bbb-3(b)(1), unless the authorization is terminated or revoked.  Performed at [facility], [address], [place], [place] [address]          Radiology Studies: DG Chest 2 View  Result Date: 07/14/2020 CLINICAL DATA:  75 year old female with shortness of breath and cough. EXAM: CHEST - 2 VIEW COMPARISON:  Chest CT 04/29/2020 and earlier. FINDINGS: Seated AP and lateral views of the chest. Large chronic left diaphragmatic hernia appears stable. Stable cardiac size and mediastinal contours. Bronchiectasis better demonstrated by CT. Chronic staple line in the right upper lobe. Stable coarse bilateral pulmonary interstitial opacity. No pneumothorax, pulmonary edema, or acute pulmonary opacity. Diffuse spinal compression fractures with at least 10 levels previously augmented. Stable visualized osseous structures. Negative visible bowel gas pattern. IMPRESSION: Chronic lung disease with bronchiectasis. Chronic left diaphragmatic hernia. No acute cardiopulmonary abnormality identified. Electronically Signed   By: Genevie Ann M.D.   On: 07/14/2020 11:54   CT Angio Chest PE W and/or Wo Contrast  Result Date: 07/14/2020 CLINICAL DATA:  PE suspected, high prob EXAM: CT ANGIOGRAPHY CHEST WITH CONTRAST TECHNIQUE: Multidetector CT imaging of the chest was performed using the standard protocol during bolus  administration of intravenous contrast. Multiplanar CT image reconstructions and MIPs were obtained to evaluate the vascular anatomy. CONTRAST:  74m OMNIPAQUE IOHEXOL 350 MG/ML SOLN COMPARISON:  Chest CT 04/29/2020 FINDINGS: Cardiovascular: Satisfactory opacification of the pulmonary arteries to the segmental level. No evidence of pulmonary embolism. Unchanged enlarged central pulmonary arteries compatible with pulmonary hypertension. Unchanged mild cardiomegaly. Atherosclerotic calcifications of the aorta. Coronary artery calcifications. Mediastinum/Nodes: There is no mediastinal, hilar, or axillary lymphadenopathy. Thyroid is unremarkable. There is a large hiatal hernia containing the a hepatic flexure of the colon and the stomach, similar to prior exam. Lungs/Pleura: Diffuse bronchial wall thickening with multifocal bronchiectasis, most severe in the right upper lobe and lung bases bilaterally. Multifocal mucous plugging. Similar architectural distortion in  the of the left lower lobe. Prior resections of the lateral segment of the right upper lobe, the right middle lobe, and inferior lingula. There is small right-sided subpleural nodularity, increased from prior exam, largest measuring 2.0 x 0.6 cm, and another measuring 0.9 x 0.8 cm (series 7, images 53 and 41 respectively). Similar subpleural nodularity in the left upper lobe. Scattered lung scarring and tree-in-bud nodularity. Mosaic attenuation bilaterally, likely due to air trapping. Upper Abdomen: Unchanged left renal cyst. Prior cholecystectomy. No acute abnormality. Musculoskeletal: Unchanged multiple thoracolumbar compression deformities with vertebroplasties. Unchanged chronic T3 compression deformity. Partially visualized severe degenerative changes in the lower cervical spine. Review of the MIP images confirms the above findings. IMPRESSION: No evidence of pulmonary embolism. Unchanged enlarged pulmonary arteries suggestive of pulmonary hypertension.  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 Increased subpleural nodularity on the right, which may represent progression of disease. Chronic mosaic attenuation of the lungs likely due to air trapping. Recommend follow-up chest CT in 3 months and follow-up with pulmonology. Aortic Atherosclerosis (ICD10-I70.0). Electronically Signed   By: Maurine Simmering   On: 07/14/2020 13:33        Scheduled Meds:  acetylcysteine  4 mL Nebulization Daily   apixaban  5 mg Oral BID   vitamin C  500 mg Oral Daily   dexamethasone (DECADRON) injection  4 mg Intravenous Q24H   digoxin  125 mcg Oral Daily   diltiazem  180 mg Oral Daily   ferrous sulfate  325 mg Oral Daily   gabapentin  300 mg Oral QID   glycopyrrolate  1 mg Oral TID   levalbuterol  2 puff Inhalation Q6H   LORazepam  0.5 mg Oral QHS   losartan  100 mg Oral Daily   metoprolol succinate  100 mg Oral Daily   pantoprazole  40 mg Oral Daily   senna  2 tablet Oral QHS   zinc sulfate  220 mg Oral Daily   Continuous Infusions:  azithromycin Stopped (07/14/20 1631)   ceFEPime (MAXIPIME) IV 2 g (07/15/20 0119)   remdesivir 100 mg in NS 100 mL       LOS: 0 days    Time spent: 33 mins     Wyvonnia Dusky, MD [facility] Pager [phone]  If 7PM-7AM, please contact night-coverage  07/15/2020, 7:48 AM

## 2020-07-15 NOTE — Progress Notes (Signed)
Order received from Dr Lanney Gins for a one time dulcolax suppository

## 2020-07-15 NOTE — Progress Notes (Signed)
Mobility Specialist - Progress Note   07/15/20 1636  Mobility  Activity Ambulated in room;Transferred to/from [facility]  Range of Motion/Exercises Active  Level of Assistance Standby assist, set-up cues, supervision of patient - no hands on  Assistive Device Front wheel walker  Distance Ambulated (ft) 15 ft  Mobility Out of bed for toileting;Ambulated with assistance in room  Mobility Response Tolerated well  Mobility performed by Mobility specialist  $Mobility charge 1 Mobility    During mobility: 73 HR, 96% SpO2 Post-mobility: 61 HR, 99% SpO2   Pt lying in bed upon arrival, utilizing 3L. Denied pain, nausea, and fatigue. Supervision to sit EOB. Denied dizziness. Pt SPT/MinA to [facility] for BM using rails. O2 briefly desat to 88% while voiding. PLB engaged. RN assisted with peri-care. Pt ambulated in room to recliner.  O2 > 93% most of session. No LOB with RW. Once seated, pt requested to brush teeth. Mobility provided set up. Pt left in recliner with needs in reach and alarm set.    Kathee Delton Mobility Specialist 07/15/20, 4:41 PM

## 2020-07-15 NOTE — Progress Notes (Signed)
Pulmonary and Critical Care Medicine          Date: 07/15/2020,   MRN# [id number] CARLETT FIRST [birth year]     AdmissionWeight: 49.9 kg                 CurrentWeight: 49.9 kg   Referring physician: Sherrie George FNP   CHIEF COMPLAINT:   Acute exacerbation of bronchiectasis due to COVID19    HISTORY OF PRESENT ILLNESS   This is a very pleasant female with hx of permanent AF, severe bronchiectasis chronically with chronic hypoxemia.  She uses Tobramycin nebulized therapy daily and has been on cipro po in the past for >20 years.  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  She is generally on 3L/min nasal canula and is slowly ambulatory.  She had mechanical fall last year and broke her foot which further hindered her functional status but is not demented.    07/15/20- Patient is improved, she is smiling during my interview.  She still is very ronchorous bilaterally but overall interval improvement. Plan to continue current care plan with estimated DC post [place] IV therapy completion.    PAST MEDICAL HISTORY   Past Medical History:  Diagnosis Date  . Arthritis   . Atrial fibrillation ([place])   . Bronchiectasis ([place])   . CAD (coronary artery disease) 07/30/2017  . Dumping syndrome   . 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 Family history of adverse reaction to anesthesia    sister [name]  . GERD (gastroesophageal reflux disease)   . Headache    MIGRAINES  . Myocardial infarction ([place]) 2007   Non-STEMI  . [name] (postoperative nausea and vomiting)   . Psoriasis   . PUD (peptic ulcer disease)      SURGICAL HISTORY   Past Surgical History:  Procedure Laterality Date  . BACK SURGERY    . CHOLECYSTECTOMY    . ESOPHAGOGASTRODUODENOSCOPY (EGD) WITH PROPOFOL N/A 03/19/2019   Procedure: ESOPHAGOGASTRODUODENOSCOPY (EGD) WITH PROPOFOL;  Surgeon: [name], MD;  Location[facility]  ENDOSCOPY;  Service: Gastroenterology;  Laterality: N/A;  *Note to anesthesia: Per pt's pulmonologist, if intubating, please extubate to BIPAP.  [name] EYE SURGERY    . FOOT SURGERY    . INTRAMEDULLARY (IM) NAIL INTERTROCHANTERIC Right 09/30/2018   Procedure: INTRAMEDULLARY (IM) NAIL INTERTROCHANTRIC;  Surgeon: Dereck Leep, MD;  Location: [facility] ORS;  Service: Orthopedics;  Laterality: Right;  . KYPHOPLASTY N/A 07/05/2016   Procedure: KYPHOPLASTY T - 9;  Surgeon: Hessie Knows, MD;  Location: [facility] ORS;  Service: Orthopedics;  Laterality: N/A;  . KYPHOPLASTY N/A 11/29/2017   Procedure: Iona Hansen;  Surgeon: Hessie Knows, MD;  Location: [facility] ORS;  Service: Orthopedics;  Laterality: N/A;  L2 and L3  . KYPHOPLASTY N/A 12/18/2017   Procedure: KYPHOPLASTY L1;  Surgeon: Hessie Knows, MD;  Location: [facility] ORS;  Service: Orthopedics;  Laterality: N/A;  . KYPHOPLASTY N/A 01/05/2018   Procedure: KYPHOPLASTY-T11,T12;  Surgeon: Hessie Knows, MD;  Location: [facility] ORS;  Service: Orthopedics;  Laterality: N/A;  . KYPHOPLASTY N/A 04/05/2018   Procedure: T10 KYPHOPLASTY;  Surgeon: Hessie Knows, MD;  Location: [facility] ORS;  Service: Orthopedics;  Laterality: N/A;  . KYPHOPLASTY N/A 04/12/2018   Procedure: KYPHOPLASTY T7,8;  Surgeon: Hessie Knows, MD;  Location: [facility] ORS;  Service: Orthopedics;  Laterality: N/A;  . KYPHOPLASTY N/A 04/19/2018   Procedure: KYPHOPLASTY T5, T6;  Surgeon: Hessie Knows, MD;  Location: [facility] ORS;  Service: Orthopedics;  Laterality:  N/A;  . LUNG SURGERY  1990 and 1996  . THOROCOTOMY WITH LOBECTOMY     LEFT LOWER THORACOTOMY / RIGHT MIDDLE LOBECTOMY     FAMILY HISTORY   Family History  Problem Relation Age of Onset  . Hypertension Mother   . Hypertension Father      SOCIAL HISTORY   Social History   Tobacco Use  . Smoking status: Never  . Smokeless tobacco: Never  Vaping Use  . Vaping Use: Never used  Substance Use Topics  . Alcohol use: No  . Drug use: No      MEDICATIONS    Home Medication:  REM   Current Medication:  Current Facility-Administered Medications:  .  acetaminophen (TYLENOL) tablet 650 mg, 650 mg, Oral, Q6H PRN, 650 mg at 07/14/20 2138 **OR** acetaminophen (TYLENOL) suppository 650 mg, 650 mg, Rectal, Q6H PRN, Shalhoub, Sherryll Burger, MD .  acetylcysteine (MUCOMYST) 20 % nebulizer / oral solution 4 mL, 4 mL, Nebulization, Daily, Shalhoub, Sherryll Burger, MD .  apixaban (ELIQUIS) tablet 5 mg, 5 mg, Oral, BID, Shalhoub, Sherryll Burger, MD, 5 mg at 07/14/20 2138 .  ascorbic acid (VITAMIN C) tablet 500 mg, 500 mg, Oral, Daily, Shalhoub, Sherryll Burger, MD, 500 mg at 07/14/20 1638 .  azithromycin (ZITHROMAX) 500 mg in sodium chloride 0.9 % 250 mL IVPB, 500 mg, Intravenous, Q24H, Shalhoub, Sherryll Burger, MD, Stopped at 07/14/20 1631 .  benzonatate (TESSALON) capsule 200 mg, 200 mg, Oral, TID PRN, Shalhoub, Sherryll Burger, MD .  [COMPLETED] ceFEPIme (MAXIPIME) 1 g in sodium chloride 0.9 % 100 mL IVPB, 1 g, Intravenous, Once, Stopped at 07/14/20 1557 **FOLLOWED BY** ceFEPIme (MAXIPIME) 2 g in sodium chloride 0.9 % 100 mL IVPB, 2 g, Intravenous, Q12H, Dorothe Pea, RPH, Last Rate: 200 mL/hr at 07/15/20 0119, 2 g at 07/15/20 0119 .  dexamethasone (DECADRON) injection 4 mg, 4 mg, Intravenous, Q24H, Lanney Gins, Marko Skalski, MD, 4 mg at 07/14/20 1639 .  digoxin (LANOXIN) tablet 125 mcg, 125 mcg, Oral, Daily, Shalhoub, Sherryll Burger, MD .  diltiazem (CARDIZEM CD) 24 hr capsule 180 mg, 180 mg, Oral, Daily, Dorothe Pea, RPH, 180 mg at 07/14/20 1638 .  ferrous sulfate tablet 325 mg, 325 mg, Oral, Daily, Shalhoub, Sherryll Burger, MD .  gabapentin (NEURONTIN) capsule 300 mg, 300 mg, Oral, QID, Athena Masse, MD, 300 mg at 07/14/20 2138 .  glycopyrrolate (ROBINUL) tablet 1 mg, 1 mg, Oral, TID, Shalhoub, Sherryll Burger, MD, 1 mg at 07/14/20 1910 .  levalbuterol [facility] HFA) inhaler 2 puff, 2 puff, Inhalation, Q6H, Shalhoub, Sherryll Burger, MD, 2 puff at 07/15/20 [phone] .  levalbuterol (XOPENEX HFA)  inhaler 2 puff, 2 puff, Inhalation, Q2H PRN, Shalhoub, Sherryll Burger, MD .  LORazepam (ATIVAN) tablet 0.5 mg, 0.5 mg, Oral, QHS, Shalhoub, Sherryll Burger, MD, 0.5 mg at 07/14/20 2138 .  losartan (COZAAR) tablet 100 mg, 100 mg, Oral, Daily, Shalhoub, Sherryll Burger, MD .  metoprolol succinate (TOPROL-XL) 24 hr tablet 100 mg, 100 mg, Oral, Daily, Shalhoub, Sherryll Burger, MD, 100 mg at 07/14/20 1638 .  ondansetron (ZOFRAN) tablet 4 mg, 4 mg, Oral, Q6H PRN **OR** ondansetron (ZOFRAN) injection 4 mg, 4 mg, Intravenous, Q6H PRN, Shalhoub, Sherryll Burger, MD .  pantoprazole (PROTONIX) EC tablet 40 mg, 40 mg, Oral, Daily, Shalhoub, Sherryll Burger, MD, 40 mg at 07/14/20 1638 .  polyethylene glycol (MIRALAX / GLYCOLAX) packet 17 g, 17 g, Oral, Daily PRN, Shalhoub, Sherryll Burger, MD .  [COMPLETED] remdesivir 200 mg in sodium chloride 0.9% 250  mL IVPB, 200 mg, Intravenous, Once, Stopped at 07/14/20 1801 **FOLLOWED BY** remdesivir 100 mg in sodium chloride 0.9 % 100 mL IVPB, 100 mg, Intravenous, Daily, Shalhoub, Sherryll Burger, MD .  senna (SENOKOT) tablet 17.2 mg, 2 tablet, Oral, QHS, Shalhoub, Sherryll Burger, MD, 17.2 mg at 07/14/20 2138 .  zinc sulfate capsule 220 mg, 220 mg, Oral, Daily, Shalhoub, Sherryll Burger, MD, 220 mg at 07/14/20 1638    ALLERGIES   Codeine, Sulfa antibiotics, and Penicillins     REVIEW OF SYSTEMS    Review of Systems:  Gen:  Denies  fever, sweats, chills weigh loss  HEENT: Denies blurred vision, double vision, ear pain, eye pain, hearing loss, nose bleeds, sore throat Cardiac:  No dizziness, chest pain or heaviness, chest tightness,edema Resp:   Denies cough or sputum porduction, shortness of breath,wheezing, hemoptysis,  Gi: Denies swallowing difficulty, stomach pain, nausea or vomiting, diarrhea, constipation, bowel incontinence Gu:  Denies bladder incontinence, burning urine Ext:   Denies Joint pain, stiffness or swelling Skin: Denies  skin rash, easy bruising or bleeding or hives Endoc:  Denies polyuria, polydipsia ,  polyphagia or weight change Psych:   Denies depression, insomnia or hallucinations   Other:  All other systems negative   VS: BP (!) 142/85 (BP Location: Left Arm)   Pulse 62   Temp 97.6 F (36.4 C)   Resp 20   Ht '5\' 5"'$  (1.651 m)   Wt 49.9 kg   SpO2 98%   BMI 18.30 kg/m      PHYSICAL EXAM    GENERAL:NAD, no fevers, chills, no weakness no fatigue HEAD: Normocephalic, atraumatic.  EYES: Pupils equal, round, reactive to light. Extraocular muscles intact. No scleral icterus.  MOUTH: Moist mucosal membrane. Dentition intact. No abscess noted.  EAR, NOSE, THROAT: Clear without exudates. No external lesions.  NECK: Supple. No thyromegaly. No nodules. No JVD.  PULMONARY: rhonchi bilaterally  CARDIOVASCULAR: S1 and S2. Regular rate and rhythm. No murmurs, rubs, or gallops. No edema. Pedal pulses 2+ bilaterally.  GASTROINTESTINAL: Soft, nontender, nondistended. No masses. Positive bowel sounds. No hepatosplenomegaly.  MUSCULOSKELETAL: No swelling, clubbing, or edema. Range of motion full in all extremities.  NEUROLOGIC: Cranial nerves II through XII are intact. No gross focal neurological deficits. Sensation intact. Reflexes intact.  SKIN: No ulceration, lesions, rashes, or cyanosis. Skin warm and dry. Turgor intact.  PSYCHIATRIC: Mood, affect within normal limits. The patient is awake, alert and oriented x 3. Insight, judgment intact.       IMAGING    DG Chest 2 View  Result Date: 07/14/2020 CLINICAL DATA:  75 year old female with shortness of breath and cough. EXAM: CHEST - 2 VIEW COMPARISON:  Chest CT 04/29/2020 and earlier. FINDINGS: Seated AP and lateral views of the chest. Large chronic left diaphragmatic hernia appears stable. Stable cardiac size and mediastinal contours. Bronchiectasis better demonstrated by CT. Chronic staple line in the right upper lobe. Stable coarse bilateral pulmonary interstitial opacity. No pneumothorax, pulmonary edema, or acute pulmonary opacity.  Diffuse spinal compression fractures with at least 10 levels previously augmented. Stable visualized osseous structures. Negative visible bowel gas pattern. IMPRESSION: Chronic lung disease with bronchiectasis. Chronic left diaphragmatic hernia. No acute cardiopulmonary abnormality identified. Electronically Signed   By: Genevie Ann M.D.   On: 07/14/2020 11:54   CT Angio Chest PE W and/or Wo Contrast  Result Date: 07/14/2020 CLINICAL DATA:  PE suspected, high prob EXAM: CT ANGIOGRAPHY CHEST WITH CONTRAST TECHNIQUE: Multidetector CT imaging of the chest was performed using  the standard protocol during bolus administration of intravenous contrast. Multiplanar CT image reconstructions and MIPs were obtained to evaluate the vascular anatomy. CONTRAST:  81m OMNIPAQUE IOHEXOL 350 MG/ML SOLN COMPARISON:  Chest CT 04/29/2020 FINDINGS: Cardiovascular: Satisfactory opacification of the pulmonary arteries to the segmental level. No evidence of pulmonary embolism. Unchanged enlarged central pulmonary arteries compatible with pulmonary hypertension. Unchanged mild cardiomegaly. Atherosclerotic calcifications of the aorta. Coronary artery calcifications. Mediastinum/Nodes: There is no mediastinal, hilar, or axillary lymphadenopathy. Thyroid is unremarkable. There is a large hiatal hernia containing the a hepatic flexure of the colon and the stomach, similar to prior exam. Lungs/Pleura: Diffuse bronchial wall thickening with multifocal bronchiectasis, most severe in the right upper lobe and lung bases bilaterally. Multifocal mucous plugging. Similar architectural distortion in the of the left lower lobe. Prior resections of the lateral segment of the right upper lobe, the right middle lobe, and inferior lingula. There is small right-sided subpleural nodularity, increased from prior exam, largest measuring 2.0 x 0.6 cm, and another measuring 0.9 x 0.8 cm (series 7, images 53 and 41 respectively). Similar subpleural nodularity in  the left upper lobe. Scattered lung scarring and tree-in-bud nodularity. Mosaic attenuation bilaterally, likely due to air trapping. Upper Abdomen: Unchanged left renal cyst. Prior cholecystectomy. No acute abnormality. Musculoskeletal: Unchanged multiple thoracolumbar compression deformities with vertebroplasties. Unchanged chronic T3 compression deformity. Partially visualized severe degenerative changes in the lower cervical spine. Review of the MIP images confirms the above findings. IMPRESSION: No evidence of pulmonary embolism. Unchanged enlarged pulmonary arteries suggestive of pulmonary hypertension. 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 Increased subpleural nodularity on the right, which may represent progression of disease. Chronic mosaic attenuation of the lungs likely due to air trapping. Recommend follow-up chest CT in 3 months and follow-up with pulmonology. Aortic Atherosclerosis (ICD10-I70.0). Electronically Signed   By: JMaurine Simmering  On: 07/14/2020 13:33      ASSESSMENT/PLAN    Acute exacerbation of bronchiectasis  - empirically on zithromax and cefepime for possible sepsis    - agree with current antibiotics    Acute COVID19 pneumonia -Remdesevir antiviral - pharmacy protocol 5 d -vitamin C -zinc -decadron '6mg'$  IV daily  -- monitor UOP - utilize external urinary catheter if possible -encourage to use IS and Acapella device for bronchopulmonary hygiene when able -d/c hepatotoxic medications while on remdesevir -supportive care with ICU telemetry monitoring -PT/OT when possible -procalcitonin, CRP and ferritin trending -pt s/p Paxlovid therapy    Chronic AF    - continue eliquis - no hemoptysis Digoxin    - currently rate controlled with BB and CchB       Thank you for allowing me to participate in the care of this patient.    Patient/Family are satisfied with care plan and all questions have been answered.   This document was prepared using Dragon voice recognition software and may include unintentional dictation errors.     FOttie Glazier M.D.  Division of [facility]

## 2020-07-15 NOTE — Progress Notes (Signed)
Initial Nutrition Assessment  DOCUMENTATION CODES:  Not applicable  INTERVENTION:  Liberalize diet to regular, encourage PO intake Magic cup BID with meals, each supplement provides 290 kcal and 9 grams of protein Multivitamin with minerals daily  NUTRITION DIAGNOSIS:  Increased nutrient needs related to acute illness (COVID19 infection) as evidenced by estimated needs.  GOAL:  Patient will meet greater than or equal to 90% of their needs  MONITOR:  PO intake, Supplement acceptance, Weight trends, I & O's  REASON FOR ASSESSMENT:  Other (Comment) (low BMI)    ASSESSMENT:  Pt with history of atrial fibrillation, CAD, bronchiectasis, HTN, GERD, MI, and PUD presented to the emergency department for treatment and evaluation of worsening COVID symptoms. Tested positive on home test 6/5. Shortness of breath has increased over the past 24 hours and having fevers. Cough is productive of green sputum.  Pt resting in bed at the time of visit. Lunch tray at bedside, ~50% consumed. Pt reports that she doesn't eat much at breakfast and lunch but normally eats more at dinner time. Discussed increased needs with COVID19. Pt does not like ensure, but is agreeable to Magic cup (orange).   Discussed weight, pt reports that she lost a significant amount of weight several years ago and has not been able to gain it back. Muscle and fat stores and mostly intact. Obtained bed weight while in room.  Average Meal Intake: 6/14-6/15: 13% intake x 2 recorded meals (0-25%)  Nutritionally Relevant Medications: Scheduled Meds:  vitamin C  500 mg Oral Daily   dexamethasone (DECADRON) injection  4 mg Intravenous Q24H   ferrous sulfate  325 mg Oral Daily   pantoprazole  40 mg Oral Daily   senna  2 tablet Oral QHS   zinc sulfate  220 mg Oral Daily   Continuous Infusions:  ceFEPime (MAXIPIME) IV 2 g (07/15/20 1113)   remdesivir 100 mg in NS 100 mL 100 mg (07/15/20 1029)   PRN Meds: calcium carbonate,  ondansetron, polyethylene glycol  Labs Reviewed: Na 128 / Chloride 85  NUTRITION - FOCUSED PHYSICAL EXAM: Flowsheet Row Most Recent Value  Orbital Region No depletion  Upper Arm Region No depletion  Thoracic and Lumbar Region No depletion  Buccal Region No depletion  Temple Region Mild depletion  Clavicle Bone Region No depletion  Clavicle and Acromion Bone Region No depletion  Scapular Bone Region No depletion  Dorsal Hand Moderate depletion  Patellar Region Mild depletion  Anterior Thigh Region No depletion  Posterior Calf Region No depletion  Edema (RD Assessment) None  Hair Reviewed  Eyes Reviewed  Mouth Reviewed  Skin Reviewed  Nails Reviewed   Diet Order:   Diet Order             Diet regular Room service appropriate? Yes; Fluid consistency: Thin  Diet effective now                   EDUCATION NEEDS:  No education needs have been identified at this time  Skin:  Skin Assessment: Reviewed RN Assessment  Last BM:  PTA  Height:  Ht Readings from Last 1 Encounters:  07/14/20 '5\' 5"'$  (1.651 m)    Weight:  Wt Readings from Last 1 Encounters:  07/15/20 52.3 kg    Ideal Body Weight:  56.8 kg  BMI:  Body mass index is 19.19 kg/m.  Estimated Nutritional Needs:  Kcal:  1600-1800 kcal/d Protein:  80-95 g/d Fluid:  >1.8L/d  Ranell Patrick, RD, LDN Clinical Dietitian Pager  on Amion 

## 2020-07-16 DIAGNOSIS — N390 Urinary tract infection, site not specified: Secondary | ICD-10-CM | POA: Diagnosis not present

## 2020-07-16 DIAGNOSIS — A310 Pulmonary mycobacterial infection: Secondary | ICD-10-CM | POA: Diagnosis not present

## 2020-07-16 DIAGNOSIS — J471 Bronchiectasis with (acute) exacerbation: Secondary | ICD-10-CM | POA: Diagnosis not present

## 2020-07-16 LAB — FERRITIN: Ferritin: 258 ng/mL (ref 11–307)

## 2020-07-16 LAB — CBC
HCT: 33.4 % — ABNORMAL LOW (ref 36.0–46.0)
Hemoglobin: 11.8 g/dL — ABNORMAL LOW (ref 12.0–15.0)
MCH: 33.4 pg (ref 26.0–34.0)
MCHC: 35.3 g/dL (ref 30.0–36.0)
MCV: 94.6 fL (ref 80.0–100.0)
Platelets: 283 10*3/uL (ref 150–400)
RBC: 3.53 MIL/uL — ABNORMAL LOW (ref 3.87–5.11)
RDW: 12 % (ref 11.5–15.5)
WBC: 10.1 10*3/uL (ref 4.0–10.5)
nRBC: 0 % (ref 0.0–0.2)

## 2020-07-16 LAB — BASIC METABOLIC PANEL
Anion gap: 3 — ABNORMAL LOW (ref 5–15)
BUN: 15 mg/dL (ref 8–23)
CO2: 37 mmol/L — ABNORMAL HIGH (ref 22–32)
Calcium: 8.3 mg/dL — ABNORMAL LOW (ref 8.9–10.3)
Chloride: 89 mmol/L — ABNORMAL LOW (ref 98–111)
Creatinine, Ser: 0.47 mg/dL (ref 0.44–1.00)
GFR, Estimated: >60 mL/min (ref >60)
Glucose, Bld: 150 mg/dL — ABNORMAL HIGH (ref 70–99)
Potassium: 4.1 mmol/L (ref 3.5–5.1)
Sodium: 129 mmol/L — ABNORMAL LOW (ref 135–145)

## 2020-07-16 LAB — C-REACTIVE PROTEIN: CRP: 6.8 mg/dL — ABNORMAL HIGH (ref <1.0)

## 2020-07-16 MED ORDER — NYSTATIN 100000 UNIT/ML MT SUSP
5.0000 mL | Freq: Four times a day (QID) | OROMUCOSAL | Status: DC
  Administered 2020-07-16 – 2020-07-20 (×15): 500000 [IU] via ORAL
  Filled 2020-07-16 (×14): qty 5

## 2020-07-16 NOTE — Evaluation (Signed)
Occupational Therapy Evaluation Patient Details Name: Ana Washington MRN: [id number] DOB: 1945/04/19 Today's Date: 07/16/2020    History of Present Illness Pt is a 75 yo female who presents to ED with acute exacerbation of bronchiectasis as well as concurrent COVID19 infection   Clinical Impression   Pt was seen for OT evaluation this date. Pt reports that prior to COVID-19 diagnosis she used 2L O2 at home, 24 hrs/day, has home health 4 hrs/day 5 days/wk, but was able to complete most ADLs with Mod I (aide assists with bathing). During today's evaluation, pt reports "I don't feel well" and appears visibly uncomfortable and SOB, although O2 sats remain in 95+% range, with 3 L O2. Pt denies "pain," but endorses "discomfort," with sore throat and burning sensation with urination. Pt is able to perform bed-level grooming with Mod I, but tires quickly and requires rest breaks. With supine<sit and OOB activity, pt reports becoming fatigued and out of breath, stating that she feels like "my heart is just pounding in my chest." (HR = 78, O2 = 98%). Provided educ re: energy conservation strategies, activity pacing, importance of gradual return to the HEP she reports performing regularly. Pt verbalized understanding but would benefit from additional skilled OT services to maximize recall and carryover of learned techniques and to facilitate return to PLOF. Prior to admission, pt was receiving HHPT. Recommend adding HHOT to services provided by the agency already serving this pt. Pt has all necessary DME.     Follow Up Recommendations  Home health OT    Equipment Recommendations  None recommended by OT    Recommendations for Other Services       Precautions / Restrictions Precautions Precautions: Fall Restrictions Weight Bearing Restrictions: No      Mobility Bed Mobility Overal bed mobility: Needs Assistance Bed Mobility: Supine to Sit     Supine to sit: Modified independent (Device/Increase  time)     General bed mobility comments: increased time, effort    Transfers Overall transfer level: Needs assistance Equipment used: Rolling walker (2 wheeled) Transfers: Stand Pivot Transfers;Sit to/from Stand Sit to Stand: Supervision Stand pivot transfers: Supervision       General transfer comment: labored effort, SUPV for safety    Balance Overall balance assessment: Needs assistance Sitting-balance support: Feet supported;Bilateral upper extremity supported Sitting balance-Leahy Scale: Good     Standing balance support: Bilateral upper extremity supported Standing balance-Leahy Scale: Fair                             ADL either performed or assessed with clinical judgement   ADL Overall ADL's : Needs assistance/impaired Eating/Feeding: Modified independent   Grooming: Set up;Supervision/safety;Sitting;Bed level Grooming Details (indicate cue type and reason): normally performs in standing. Today engaged in grooming at bed level, 2/2 SOB                             Functional mobility during ADLs: Rolling walker;Min guard;Minimal assistance       Vision Baseline Vision/History: Wears glasses Wears Glasses: At all times Patient Visual Report: No change from baseline       Perception     Praxis      Pertinent Vitals/Pain Pain Assessment: No/denies pain     Hand Dominance Right   Extremity/Trunk Assessment Upper Extremity Assessment Upper Extremity Assessment: Overall WFL for tasks assessed;LUE deficits/detail LUE Deficits / Details: trigger finger,  3rd and 4th digits   Lower Extremity Assessment Lower Extremity Assessment: Overall WFL for tasks assessed       Communication Communication Communication: No difficulties   Cognition Arousal/Alertness: Awake/alert Behavior During Therapy: WFL for tasks assessed/performed Overall Cognitive Status: Within Functional Limits for tasks assessed                                  General Comments: [name] x 4   General Comments  extensive bruising BLE    Exercises Other Exercises Other Exercises: educ re: ECS, gradual HEP, deep breathing techniques   Shoulder Instructions      Home Living Family/patient expects to be discharged to:: Private residence Living Arrangements: Spouse/significant other Available Help at Discharge: Family;Available PRN/intermittently;Personal care attendant (aide (working for both pt and spouse) comes 9AM-1PM 5days/wk. Son comes by with food each evening. Sister and daughter help PRN.) Type of Home: House Home Access: Level entry     Home Layout: One level     Bathroom Shower/Tub: Teacher, early years/pre: Handicapped height     Home Equipment: Environmental consultant - 2 wheels;Bedside commode;Tub bench;Wheelchair - Education officer, community - power          Prior Functioning/Environment Level of Independence: Needs assistance  Gait / Transfers Assistance Needed: Primarily household ambulatory with RW. ADL's / Homemaking Assistance Needed: Aid assists with ADLs and IADLs.            OT Problem List: Decreased strength;Decreased activity tolerance;Impaired balance (sitting and/or standing);Cardiopulmonary status limiting activity      OT Treatment/Interventions: Self-care/ADL training;Therapeutic exercise;Patient/family education;Balance training;Neuromuscular education;Energy conservation;Therapeutic activities;DME and/or AE instruction    OT Goals(Current goals can be found in the care plan section) Acute Rehab OT Goals Patient Stated Goal: to feel better and get home OT Goal Formulation: With patient Time For Goal Achievement: 07/30/20 Potential to Achieve Goals: Good ADL Goals Pt Will Perform Grooming: with modified independence;standing Pt Will Perform Lower Body Dressing: sitting/lateral leans;with modified independence Pt Will Transfer to Toilet: stand pivot transfer;with modified independence;grab  bars Pt/caregiver will Perform Home Exercise Program: Increased strength;Increased ROM;Independently (to increase strength and endurance)  OT Frequency: Min 2X/week   Barriers to D/C:            Co-evaluation              AM-PAC OT "6 Clicks" Daily Activity     Outcome Measure Help from another person eating meals?: None Help from another person taking care of personal grooming?: A Little Help from another person toileting, which includes using toliet, bedpan, or urinal?: A Little Help from another person bathing (including washing, rinsing, drying)?: A Little Help from another person to put on and taking off regular upper body clothing?: A Little Help from another person to put on and taking off regular lower body clothing?: A Lot 6 Click Score: 18   End of Session Equipment Utilized During Treatment: Rolling walker Nurse Communication: Other (comment) (no call bell in room)  Activity Tolerance: Patient tolerated treatment well;Other (comment);Patient limited by fatigue (Limited by SOB) Patient left: in chair;with call bell/phone within reach;with chair alarm set  OT Visit Diagnosis: Unsteadiness on feet (R26.81);Muscle weakness (generalized) (M62.81)                Time: 1005-1050 OT Time Calculation (min): 45 min Charges:  OT General Charges $OT Visit: 1 Visit OT Evaluation $OT Eval Moderate Complexity: 1 Mod  OT Treatments $Self Care/Home Management : 38-52 mins Josiah Lobo, PhD, MS, OTR/L 07/16/20, 2:10 PM

## 2020-07-16 NOTE — Evaluation (Signed)
Clinical/Bedside Swallow Evaluation Patient Details  Name: Ana Washington MRN: [id number] Date of Birth: October 30, 1945  Today's Date: 07/16/2020 Time: SLP Start Time (ACUTE ONLY): 1120 SLP Stop Time (ACUTE ONLY): 1210 SLP Time Calculation (min) (ACUTE ONLY): 50 min  Past Medical History:  Past Medical History:  Diagnosis Date   Arthritis    Atrial fibrillation ([employer])    Bronchiectasis ([place])    CAD (coronary artery disease) 07/30/2017   Dumping syndrome    Essential hypertension, malignant 10/03/2013   Family history of adverse reaction to anesthesia    sister [name]   GERD (gastroesophageal reflux disease)    Headache    MIGRAINES   Myocardial infarction ([place]) 2007   Non-STEMI   [name] (postoperative nausea and vomiting)    Psoriasis    PUD (peptic ulcer disease)    Past Surgical History:  Past Surgical History:  Procedure Laterality Date   BACK SURGERY     CHOLECYSTECTOMY     ESOPHAGOGASTRODUODENOSCOPY (EGD) WITH PROPOFOL N/A 03/19/2019   Procedure: ESOPHAGOGASTRODUODENOSCOPY (EGD) WITH PROPOFOL;  Surgeon: [name], MD;  Location: [facility] ENDOSCOPY;  Service: Gastroenterology;  Laterality: N/A;  *Note to anesthesia: Per pt's pulmonologist, if intubating, please extubate to BIPAP.   EYE SURGERY     FOOT SURGERY     INTRAMEDULLARY (IM) NAIL INTERTROCHANTERIC Right 09/30/2018   Procedure: INTRAMEDULLARY (IM) NAIL INTERTROCHANTRIC;  Surgeon: Dereck Leep, MD;  Location: [facility] ORS;  Service: Orthopedics;  Laterality: Right;   KYPHOPLASTY N/A 07/05/2016   Procedure: KYPHOPLASTY T - 9;  Surgeon: Hessie Knows, MD;  Location: [facility] ORS;  Service: Orthopedics;  Laterality: N/A;   KYPHOPLASTY N/A 11/29/2017   Procedure: Iona Hansen;  Surgeon: Hessie Knows, MD;  Location: [facility] ORS;  Service: Orthopedics;  Laterality: N/A;  L2 and L3   KYPHOPLASTY N/A 12/18/2017   Procedure: KYPHOPLASTY L1;  Surgeon: Hessie Knows, MD;  Location: [facility] ORS;  Service: Orthopedics;  Laterality: N/A;    KYPHOPLASTY N/A 01/05/2018   Procedure: KYPHOPLASTY-T11,T12;  Surgeon: Hessie Knows, MD;  Location: [facility] ORS;  Service: Orthopedics;  Laterality: N/A;   KYPHOPLASTY N/A 04/05/2018   Procedure: T10 KYPHOPLASTY;  Surgeon: Hessie Knows, MD;  Location: [facility] ORS;  Service: Orthopedics;  Laterality: N/A;   KYPHOPLASTY N/A 04/12/2018   Procedure: KYPHOPLASTY T7,8;  Surgeon: Hessie Knows, MD;  Location: [facility] ORS;  Service: Orthopedics;  Laterality: N/A;   KYPHOPLASTY N/A 04/19/2018   Procedure: KYPHOPLASTY T5, T6;  Surgeon: Hessie Knows, MD;  Location: [facility] ORS;  Service: Orthopedics;  Laterality: N/A;   LUNG SURGERY  1990 and 1996   THOROCOTOMY WITH LOBECTOMY     LEFT LOWER THORACOTOMY / RIGHT MIDDLE LOBECTOMY   HPI:  Per admitting H&P "75 year old female with past medical history of bronchiectasis, MAC, chronic respiratory failure (on 2lpm via [place] at home), Takotsubo cardiomyopathy (2015 at [facility]), gastroesophageal reflux disease, hypertension, paroxysmal atrial fibrillation (on Eliquis), generalized anxiety disorder, diastolic congestive heart failure (Echo 03/2020 EF 50-55% with G1DD) who presents to  at [facility]  emergency department with complaints of shortness of breath.    Patient explains that approximately 10 days ago her husband became unwell.  He began to develop cough and shortness of breath.  On Sunday, June 5th she and her husband both used an at home test and were positive for COVID.  At that time, she was asymptomatic."   Assessment / Plan / Recommendation Clinical Impression  Pt presents with mild oral dysphagia but no s/s of aspiration today. Pt complains of food  often getting stuck (points to sternum area). She says she has to chew food thoroughly and still often has to belch, gag and cough to eventually get food to come back up if she cant get it to move with sips of water. Today, she tolerated sisps of water, applesauce and a small bite of graham cracker without s/s of aspiration. Oral transit  delay with the solids as she chewed thoroughly before swallowing. mild oral residue after the swallow. Vocal quality remained clear and laryngeal elevation appeared adequate. rec Dys 3 diet/mechanical soft for ease of chewing as Mrs Legler did fatigue easily. She reports she gets tired easy at [place] but even more so since her recent illness with COVID and current UTI. Discussed need for GI consult or Ba swallow once she is discharged from the hospital and feeling better as she may have a stricture. She has seen GI in the past as an outpatient but not in rececent year as both GI physicians are no longer practicing. Discussed need to establish with another GI specialist. ST to follow up with toleration of diet and aletr if needed. SLP Visit Diagnosis: Dysphagia, oropharyngeal phase (R13.12)    Aspiration Risk  Mild aspiration risk    Diet Recommendation Dysphagia 3 (Mech soft)   Liquid Administration via: Cup;Straw Medication Administration: Whole meds with liquid (May need to crush large pills. One at a time if given whole) Compensations: Minimize environmental distractions;Slow rate;Small sips/bites;Other (Comment) (Periods of rest between bites) Postural Changes: Seated upright at 90 degrees;Remain upright for at least 30 minutes after po intake    Other  Recommendations Recommended Consults: Consider GI evaluation Oral Care Recommendations: Patient independent with oral care   Follow up Recommendations None      Frequency and Duration Other (Comment)  1 week       Prognosis Prognosis for Safe Diet Advancement: Good      Swallow Study   General Date of Onset: 07/14/20 HPI: Per admitting H&P "75 year old female with past medical history of bronchiectasis, MAC, chronic respiratory failure (on 2lpm via [place] at home), Takotsubo cardiomyopathy (2015 at [facility]), gastroesophageal reflux disease, hypertension, paroxysmal atrial fibrillation (on Eliquis), generalized anxiety disorder,  diastolic congestive heart failure (Echo 03/2020 EF 50-55% with G1DD) who presents to  at [facility]  emergency department with complaints of shortness of breath.    Patient explains that approximately 10 days ago her husband became unwell.  He began to develop cough and shortness of breath.  On Sunday, June 5th she and her husband both used an at home test and were positive for COVID.  At that time, she was asymptomatic." Type of Study: Bedside Swallow Evaluation Diet Prior to this Study: Regular Temperature Spikes Noted: No Respiratory Status: Nasal cannula Behavior/Cognition: Alert;Cooperative;Pleasant mood Oral Cavity Assessment: Within Functional Limits Oral Care Completed by SLP: No Oral Cavity - Dentition: Dentures, top Vision: Functional for self-feeding Self-Feeding Abilities: Able to feed self Patient Positioning: Upright in chair Baseline Vocal Quality: Normal;Hoarse    Oral/Motor/Sensory Function Overall Oral Motor/Sensory Function: Mild impairment Facial Symmetry: Within Functional Limits Facial Strength: Within Functional Limits Facial Sensation: Within Functional Limits Lingual ROM: Within Functional Limits Lingual Symmetry: Within Functional Limits Lingual Strength: Reduced Lingual Sensation: Within Functional Limits Velum: Within Functional Limits Mandible: Within Functional Limits   Ice Chips Ice chips: Not tested   Thin Liquid Thin Liquid: Within functional limits Presentation: Cup;Spoon;Straw    Nectar Thick Nectar Thick Liquid: Not tested   Honey Thick Honey Thick Liquid: Not  tested   Puree Puree: Within functional limits   Solid     Solid: Impaired Presentation: Self Fed Oral Phase Functional Implications: Prolonged oral transit      Lucila Maine 07/16/2020,12:38 PM

## 2020-07-16 NOTE — Plan of Care (Signed)
  Problem: Clinical Measurements: Goal: Ability to maintain clinical measurements within normal limits will improve Outcome: Progressing Goal: Will remain free from infection Outcome: Progressing Goal: Diagnostic test results will improve Outcome: Progressing Goal: Respiratory complications will improve Outcome: Progressing Goal: Cardiovascular complication will be avoided Outcome: Progressing   Problem: Pain Managment: Goal: General experience of comfort will improve Outcome: Progressing   Problem: Safety: Goal: Ability to remain free from injury will improve Outcome: Progressing   Pt is involved in and agrees with the plan of care. Alert and oriented. V/S stable except HR at 40s-50s while sleeping. On chronic oxygen at 3lpm/[place] with sats at 99%. Dyspnea on exertion noted. Has productive cough with yellowish phlegm. Pt reports green phlegm yesterday afternoon. No complaints of pain.

## 2020-07-16 NOTE — Progress Notes (Signed)
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 Continue on IV steroids, azithromycin, cefepime and bronchodilators. Encourage incentive spirometry. Pulmon following and recs apprec  COVID19 infection: continue on IV remdesivir, IV steroids and bronchodilators. Had an allergic reaction to paxlovid early in her course of illness & sensation of her throat swelling up at that time   UTI: urine cx is growing gram neg rods, sens pending. Continue on IV abxs   Possibly dysphagia: started on dysphagia III diet as per speech   Hyponatremia: trending up today. Will continue to monitor   Lactic acidosis: resolved  Sepsis: met criteria w/ tachycardia, luekocytosis, fever & COVID19 infection & UTI. Resolved   HTN: continue on home dose of losartan, diltiazem, metoprolol   PAF: continue on eliquis, metoprolol, & diltiazem  Hx of CAD: w/ minimally elevated troponins likely secondary to demand ischemia  MAC: continue on IV azithromycin, cefepime. Pulmon recs apprec  GERD: continue on pantoprazole    DVT prophylaxis: eliquis  Code Status: DNR  Family Communication:  Disposition Plan: depends on PT/OT recs   Level of care: Med-Surg  Status is: Inpatient  Remains inpatient  appropriate because:Unsafe d/c plan, IV treatments appropriate due to intensity of illness or inability to take PO, and Inpatient level of care appropriate due to severity of illness  Dispo: The patient is from: Home              Anticipated d/c is to: Home vs SNF               Patient currently is not medically stable to d/c.   Difficult to place patient: unclear       Consultants:  Pulmon   Procedures:   Antimicrobials: azithromycin, cefepime    Subjective: Pt c/o burning w/ urination   Objective: Vitals:   07/15/20 1608 07/15/20 2006 07/15/20 2340 07/16/20 0412  BP: 109/64 (!) 157/84 118/68 124/76  Pulse: 63 (!) 58 (!) 51 (!) 54  Resp: _0 Temp: 97.6 F (36.4 C) (!) 97.5 F (36.4 C) 97.9 F (36.6 C)   TempSrc:      SpO2: 100% 100% 100% 100%  Weight:      Height:        Intake/Output Summary (Last 24 hours) at 07/16/2020 0801 Last data filed at 07/16/2020 0426 Gross per 24 hour  Intake 1365.28 ml  Output 901 ml  Net 464.28 ml   Filed Weights   07/14/20 1113 07/15/20 1532  Weight: 49.9 kg 52.3 kg    Examination:  General exam: Appears comfortable  Respiratory system: decreased breath sounds b/l  Cardiovascular system: S1/S2+. No rubs or clicks  Gastrointestinal system: Abd is soft, NT, ND, hypoactive bowel sounds  Central nervous system: Alert and oriented. Moves all extremities  Psychiatry: Judgement and insight appear normal. Flat mood and affect  Data Reviewed: I have personally reviewed following labs and imaging studies  CBC: Recent Labs  Lab 07/14/20 1118 07/15/20 0638 07/16/20 0530  WBC 16.3* 11.7* 10.1  NEUTROABS  --  10.7*  --   HGB 14.2 12.3 11.8*  HCT 40.9 35.0* 33.4*  MCV 96.2 93.1 94.6  PLT 349 268 952   Basic Metabolic Panel: Recent Labs  Lab 07/14/20 1118 07/15/20 0638 07/16/20 0530  NA 129* 128* 129*  K 4.1 3.5 4.1  CL 83* 85* 89*  CO2 37* 34* 37*  GLUCOSE 87 112* 150*  BUN _0 CREATININE 0.58  0.48 0.47  CALCIUM 9.3 7.7* 8.3*  MG  --  1.7  --    GFR: Estimated Creatinine Clearance: 50.2 mL/min (by C-G formula based on SCr of 0.47 mg/dL). Liver Function Tests: Recent Labs  Lab 07/14/20 1118 07/15/20 0638  AST 30 27  ALT 20 17  ALKPHOS 50 36*  BILITOT 0.7 0.6  PROT 6.9 5.2*  ALBUMIN 3.9 2.5*   No results for input(s): LIPASE, AMYLASE in the last 168 hours. No results for input(s): AMMONIA in the last 168 hours. Coagulation Profile: Recent Labs  Lab 07/14/20 1158  INR 1.0   Cardiac Enzymes: No results for input(s): CKTOTAL, CKMB, CKMBINDEX, TROPONINI in the last 168 hours. BNP (last 3 results) No results for input(s): PROBNP in the last 8760 hours. HbA1C: No results for input(s): HGBA1C in the last 72 hours. CBG: No results for input(s): GLUCAP in the last 168 hours. Lipid Profile: No results for input(s): CHOL, HDL, LDLCALC, TRIG, CHOLHDL, LDLDIRECT in the last 72 hours. Thyroid Function Tests: No results for input(s): TSH, T4TOTAL, FREET4, T3FREE, THYROIDAB in the last 72 hours. Anemia Panel: Recent Labs    07/16/20 0530  FERRITIN 258   Sepsis Labs: Recent Labs  Lab 07/14/20 1158 07/14/20 1335  PROCALCITON <0.10  --   LATICACIDVEN 2.5* 1.7    Recent Results (from the past 240 hour(s))  Blood culture (routine x 2)     Status: None (Preliminary result)   Collection Time: 07/14/20 11:58 AM   Specimen: BLOOD  Result Value Ref Range Status   Specimen Description BLOOD LEFT ANTECUBITAL  Final   Special Requests   Final    BOTTLES DRAWN AEROBIC AND ANAEROBIC Blood Culture adequate volume   Culture   Final    NO GROWTH 2 DAYS Performed at [facility], [address], [place], [place] [address]    Report Status PENDING  Incomplete  Blood culture (routine x 2)     Status: None (Preliminary result)   Collection Time: 07/14/20 12:17 PM   Specimen: BLOOD  Result Value Ref Range Status   Specimen Description BLOOD BLOOD LEFT ARM  Final    Special Requests   Final    BOTTLES DRAWN AEROBIC AND ANAEROBIC Blood Culture adequate volume   Culture   Final    NO GROWTH 2 DAYS Performed at [facility], [address] [place], [place] [address]    Report Status PENDING  Incomplete  Resp Panel by RT-PCR (Flu A&B, Covid) Urine, Clean Catch     Status: Abnormal   Collection Time: 07/14/20  2:03 PM   Specimen: Urine, Clean Catch; Nasopharyngeal(NP) swabs in vial transport medium  Result Value Ref Range Status   SARS Coronavirus 2 by RT PCR POSITIVE (A) NEGATIVE Final    Comment: RESULT CALLED TO, READ BACK BY AND VERIFIED WITH: ZACK REGISTER _1  07/14/20 MJU (NOTE) SARS-CoV-2  target nucleic acids are DETECTED.  The SARS-CoV-2 RNA is generally detectable in upper respiratory specimens during the acute phase of infection. Positive results are indicative of the presence of the identified virus, but do not rule out bacterial infection or co-infection with other pathogens not detected by the test. Clinical correlation with patient history and other diagnostic information is necessary to determine patient infection status. The expected result is Negative.  Fact Sheet for Patients: EntrepreneurPulse.com.au  Fact Sheet for Healthcare Providers: IncredibleEmployment.be  This test is not yet approved or cleared by the [place] FDA and  has been authorized for detection and/or diagnosis of SARS-CoV-2 by FDA under an Emergency Use Authorization (EUA).  This EUA will remain in effect (meaning this test can be u sed) for the duration of  the COVID-19 declaration under Section 564(b)(1) of the Act, 21 U.S.C. section 360bbb-3(b)(1), unless the authorization is terminated or revoked sooner.     Influenza A by PCR NEGATIVE NEGATIVE Final   Influenza B by PCR NEGATIVE NEGATIVE Final    Comment: (NOTE) The Xpert Xpress SARS-CoV-2/FLU/RSV plus assay is intended as an aid in the diagnosis  of influenza from Nasopharyngeal swab specimens and should not be used as a sole basis for treatment. Nasal washings and aspirates are unacceptable for Xpert Xpress SARS-CoV-2/FLU/RSV testing.  Fact Sheet for Patients: EntrepreneurPulse.com.au  Fact Sheet for Healthcare Providers: IncredibleEmployment.be  This test is not yet approved or cleared by the [place] FDA and has been authorized for detection and/or diagnosis of SARS-CoV-2 by FDA under an Emergency Use Authorization (EUA). This EUA will remain in effect (meaning this test can be used) for the duration of the COVID-19 declaration under Section 564(b)(1) of the Act, 21 U.S.C. section 360bbb-3(b)(1), unless the authorization is terminated or revoked.  Performed at [facility], [address], [place], [place] [address]          Radiology Studies: DG Chest 2 View  Result Date: 07/14/2020 CLINICAL DATA:  75 year old female with shortness of breath and cough. EXAM: CHEST - 2 VIEW COMPARISON:  Chest CT 04/29/2020 and earlier. FINDINGS: Seated AP and lateral views of the chest. Large chronic left diaphragmatic hernia appears stable. Stable cardiac size and mediastinal contours. Bronchiectasis better demonstrated by CT. Chronic staple line in the right upper lobe. Stable coarse bilateral pulmonary interstitial opacity. No pneumothorax, pulmonary edema, or acute pulmonary opacity. Diffuse spinal compression fractures with at least 10 levels previously augmented. Stable visualized osseous structures. Negative visible bowel gas pattern. IMPRESSION: Chronic lung disease with bronchiectasis. Chronic left diaphragmatic hernia. No acute cardiopulmonary abnormality identified. Electronically Signed   By: Genevie Ann M.D.   On: 07/14/2020 11:54   CT Angio Chest PE W and/or Wo Contrast  Result Date: 07/14/2020 CLINICAL DATA:  PE suspected, high prob EXAM: CT ANGIOGRAPHY CHEST WITH CONTRAST  TECHNIQUE: Multidetector CT imaging of the chest was performed using the standard protocol during bolus administration of intravenous contrast. Multiplanar CT image reconstructions and MIPs were obtained to evaluate the vascular anatomy. CONTRAST:  58m OMNIPAQUE IOHEXOL 350 MG/ML SOLN COMPARISON:  Chest CT 04/29/2020 FINDINGS: Cardiovascular: Satisfactory opacification of the pulmonary arteries to the segmental level. No evidence of pulmonary embolism. Unchanged enlarged central pulmonary arteries compatible with pulmonary hypertension. Unchanged mild cardiomegaly. Atherosclerotic calcifications of the aorta. Coronary artery calcifications. Mediastinum/Nodes: There is no mediastinal, hilar, or axillary lymphadenopathy. Thyroid is unremarkable. There is a large hiatal hernia containing the a hepatic flexure of the colon and the stomach, similar to  prior exam. Lungs/Pleura: Diffuse bronchial wall thickening with multifocal bronchiectasis, most severe in the right upper lobe and lung bases bilaterally. Multifocal mucous plugging. Similar architectural distortion in the of the left lower lobe. Prior resections of the lateral segment of the right upper lobe, the right middle lobe, and inferior lingula. There is small right-sided subpleural nodularity, increased from prior exam, largest measuring 2.0 x 0.6 cm, and another measuring 0.9 x 0.8 cm (series 7, images 53 and 41 respectively). Similar subpleural nodularity in the left upper lobe. Scattered lung scarring and tree-in-bud nodularity. Mosaic attenuation bilaterally, likely due to air trapping. Upper Abdomen: Unchanged left renal cyst. Prior cholecystectomy. No acute abnormality. Musculoskeletal: Unchanged multiple thoracolumbar compression deformities with vertebroplasties. Unchanged chronic T3 compression deformity. Partially visualized severe degenerative changes in the lower cervical spine. Review of the MIP images confirms the above findings. IMPRESSION: No  evidence of pulmonary embolism. Unchanged enlarged pulmonary arteries suggestive of pulmonary hypertension. 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 Increased subpleural nodularity on the right, which may represent progression of disease. Chronic mosaic attenuation of the lungs likely due to air trapping. Recommend follow-up chest CT in 3 months and follow-up with pulmonology. Aortic Atherosclerosis (ICD10-I70.0). Electronically Signed   By: Maurine Simmering   On: 07/14/2020 13:33        Scheduled Meds:  apixaban  5 mg Oral BID   vitamin C  500 mg Oral Daily   bisacodyl  10 mg Rectal Once   dexamethasone (DECADRON) injection  4 mg Intravenous Q24H   digoxin  125 mcg Oral Daily   diltiazem  180 mg Oral Daily   ferrous sulfate  325 mg Oral Daily   gabapentin  300 mg Oral QID   glycopyrrolate  1 mg Oral TID   levalbuterol  2 puff Inhalation Q6H   LORazepam  0.5 mg Oral QHS   losartan  100 mg Oral Daily   metoprolol succinate  100 mg Oral Daily   multivitamin with minerals  1 tablet Oral Daily   pantoprazole  40 mg Oral Daily   senna  2 tablet Oral QHS   zinc sulfate  220 mg Oral Daily   Continuous Infusions:  azithromycin Stopped (07/15/20 1608)   ceFEPime (MAXIPIME) IV Stopped (07/16/20 0125)   remdesivir 100 mg in NS 100 mL Stopped (07/15/20 1102)     LOS: 1 day    Time spent: 35 mins     Wyvonnia Dusky, MD [facility] Pager [phone]  If 7PM-7AM, please contact night-coverage  07/16/2020, 8:01 AM

## 2020-07-16 NOTE — Clinical Social Work Note (Signed)
Patient on COVID isolation precautions. Tried calling in room for readmission prevention screen but no answer. Will try again later. Patient is active with [facility] for PT and OT.  [place] Scrape, [place]

## 2020-07-16 NOTE — Evaluation (Signed)
Physical Therapy Evaluation Patient Details Name: Ana Washington MRN: [id number] DOB: 1945/06/15 Today's Date: 07/16/2020   History of Present Illness  Pt is a 75 yo female who presents to ED with acute exacerbation of bronchiectasis as well as concurrent COVID19 infection  Clinical Impression  Patient received in recliner. She reports she needs to go to the bathroom prior to walking. She reports she is pretty independent with 4 wheeled walker at home. Feeling a bit short of breath. Patient requires supervision for sit to stand and ambulated 20 feet x 2 with RW and min guard to supervision. Patient reports she is weak with mobility. Increased sob with ambulation on 3 lpm, O2 saturations at 90%.  She will continue to benefit from skilled PT while here to improve functional independence and activity tolerance.      Follow Up Recommendations Home health PT    Equipment Recommendations  None recommended by PT    Recommendations for Other Services       Precautions / Restrictions Precautions Precautions: Fall Precaution Comments: mod fall Restrictions Weight Bearing Restrictions: No      Mobility  Bed Mobility Overal bed mobility: Modified Independent Bed Mobility: Sit to Supine     Supine to sit: Modified independent (Device/Increase time) Sit to supine: Modified independent (Device/Increase time)   General bed mobility comments: increased time, effort    Transfers Overall transfer level: Needs assistance Equipment used: Rolling walker (2 wheeled) Transfers: Sit to/from Stand Sit to Stand: Supervision Stand pivot transfers: Supervision       General transfer comment: labored effort, SUPV for safety  Ambulation/Gait Ambulation/Gait assistance: Min guard Gait Distance (Feet): 40 Feet Assistive device: Rolling walker (2 wheeled) Gait Pattern/deviations: Step-through pattern Gait velocity: WFL   General Gait Details: patient able to ambulate 20 feet x 2 with seated  rest between ( RN to replace tele box during session)  Stairs            Wheelchair Mobility    Modified Rankin (Stroke Patients Only)       Balance Overall balance assessment: Needs assistance Sitting-balance support: Feet supported Sitting balance-Leahy Scale: Good     Standing balance support: Bilateral upper extremity supported;During functional activity Standing balance-Leahy Scale: Fair Standing balance comment: requires B UE support                             Pertinent Vitals/Pain Pain Assessment: No/denies pain    Home Living Family/patient expects to be discharged to:: Private residence Living Arrangements: Spouse/significant other Available Help at Discharge: Family;Available PRN/intermittently;Available 24 hours/day Type of Home: House Home Access: Level entry     Home Layout: One level Home Equipment: Walker - 2 wheels;Bedside commode;Tub bench;Wheelchair - Education officer, community - power Additional Comments: Her sister lives close and assists, patient helps husband who is disabled    Prior Function Level of Independence: Independent with assistive device(s)   Gait / Transfers Assistance Needed: Primarily household ambulatory with RW.  ADL's / Homemaking Assistance Needed: Aid assists with ADLs and IADLs. 9-1 m-f        Hand Dominance   Dominant Hand: Right    Extremity/Trunk Assessment   Upper Extremity Assessment Upper Extremity Assessment: Overall WFL for tasks assessed;LUE deficits/detail LUE Deficits / Details: trigger finger, 3rd and 4th digits    Lower Extremity Assessment Lower Extremity Assessment: Generalized weakness    Cervical / Trunk Assessment Cervical / Trunk Assessment: Normal  Communication  Communication: No difficulties  Cognition Arousal/Alertness: Awake/alert Behavior During Therapy: WFL for tasks assessed/performed Overall Cognitive Status: Within Functional Limits for tasks assessed                                  General Comments: [name] x 4      General Comments General comments (skin integrity, edema, etc.): extensive bruising BLE    Exercises Other Exercises Other Exercises: educ re: ECS, gradual HEP, deep breathing techniques   Assessment/Plan    PT Assessment Patient needs continued PT services  PT Problem List Decreased strength;Decreased mobility;Decreased activity tolerance       PT Treatment Interventions Therapeutic exercise;Gait training;Functional mobility training;Therapeutic activities;Patient/family education    PT Goals (Current goals can be found in the Care Plan section)  Acute Rehab PT Goals Patient Stated Goal: to feel better and get home PT Goal Formulation: With patient Time For Goal Achievement: 07/23/20 Potential to Achieve Goals: Good    Frequency Min 2X/week   Barriers to discharge Decreased caregiver support      Co-evaluation               AM-PAC PT "6 Clicks" Mobility  Outcome Measure Help needed turning from your back to your side while in a flat bed without using bedrails?: None Help needed moving from lying on your back to sitting on the side of a flat bed without using bedrails?: None Help needed moving to and from a bed to a chair (including a wheelchair)?: A Little Help needed standing up from a chair using your arms (e.g., wheelchair or bedside chair)?: A Little Help needed to walk in hospital room?: A Little Help needed climbing 3-5 steps with a railing? : A Little 6 Click Score: 20    End of Session Equipment Utilized During Treatment: Gait belt;Oxygen Activity Tolerance: Patient tolerated treatment well Patient left: in bed;with call bell/phone within reach Nurse Communication: Mobility status PT Visit Diagnosis: Muscle weakness (generalized) (M62.81);Difficulty in walking, not elsewhere classified (R26.2)    Time: [id number] PT Time Calculation (min) (ACUTE ONLY): 46 min   Charges:   PT  Evaluation $PT Eval Moderate Complexity: 1 Mod PT Treatments $Gait Training: 8-22 mins $Therapeutic Activity: 8-22 mins        [employer], PT, G[employer] 07/16/20,3:39 PM

## 2020-07-16 NOTE — Progress Notes (Signed)
Pulmonary and Critical Care Medicine          Date: 07/16/2020,   MRN# [id number] Ana Washington Jul 25, 1945     AdmissionWeight: 49.9 kg                 CurrentWeight: 52.3 kg (bed weight)   Referring physician: Sherrie George FNP   CHIEF COMPLAINT:   Acute exacerbation of bronchiectasis due to [place]   This is a very pleasant female with hx of permanent AF, severe bronchiectasis chronically with chronic hypoxemia.  She uses Tobramycin nebulized therapy daily and has been on cipro po in the past for >20 years.  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  She is generally on 3L/min nasal canula and is slowly ambulatory.  She had mechanical fall last year and broke her foot which further hindered her functional status but is not demented.    07/15/20- Patient is improved, she is smiling during my interview.  She still is very ronchorous bilaterally but overall interval improvement. Plan to continue current care plan with estimated DC post [place] IV therapy completion.   07/16/20- Patient has oral thrush with sore throat and ecoli UTI.  I discussed her blood work and findings. Weaning to 2L/min.  She walked to door and back twice today    PAST MEDICAL HISTORY   Past Medical History:  Diagnosis Date  . Arthritis   . Atrial fibrillation ([place])   . Bronchiectasis ([place])   . CAD (coronary artery disease) 07/30/2017  . Dumping syndrome   . 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 Family history of adverse reaction to anesthesia    sister [name]  . GERD (gastroesophageal reflux disease)   . Headache    MIGRAINES  . Myocardial infarction ([place]) 2007   Non-STEMI  . [name] (postoperative nausea and vomiting)   . Psoriasis   . PUD (peptic ulcer disease)      SURGICAL HISTORY   Past Surgical History:  Procedure Laterality Date  . BACK SURGERY    .  CHOLECYSTECTOMY    . ESOPHAGOGASTRODUODENOSCOPY (EGD) WITH PROPOFOL N/A 03/19/2019   Procedure: ESOPHAGOGASTRODUODENOSCOPY (EGD) WITH PROPOFOL;  Surgeon: [name], MD;  Location: [facility] ENDOSCOPY;  Service: Gastroenterology;  Laterality: N/A;  *Note to anesthesia: Per pt's pulmonologist, if intubating, please extubate to BIPAP.  [name] EYE SURGERY    . FOOT SURGERY    . INTRAMEDULLARY (IM) NAIL INTERTROCHANTERIC Right 09/30/2018   Procedure: INTRAMEDULLARY (IM) NAIL INTERTROCHANTRIC;  Surgeon: Dereck Leep, MD;  Location: [facility] ORS;  Service: Orthopedics;  Laterality: Right;  . KYPHOPLASTY N/A 07/05/2016   Procedure: KYPHOPLASTY T - 9;  Surgeon: Hessie Knows, MD;  Location: [facility] ORS;  Service: Orthopedics;  Laterality: N/A;  . KYPHOPLASTY N/A 11/29/2017   Procedure: Iona Hansen;  Surgeon: Hessie Knows, MD;  Location: [facility] ORS;  Service: Orthopedics;  Laterality: N/A;  L2 and L3  . KYPHOPLASTY N/A 12/18/2017   Procedure: KYPHOPLASTY L1;  Surgeon: Hessie Knows, MD;  Location: [facility] ORS;  Service: Orthopedics;  Laterality: N/A;  . KYPHOPLASTY N/A 01/05/2018   Procedure: KYPHOPLASTY-T11,T12;  Surgeon: Hessie Knows, MD;  Location: [facility] ORS;  Service: Orthopedics;  Laterality: N/A;  . KYPHOPLASTY N/A 04/05/2018   Procedure: T10 KYPHOPLASTY;  Surgeon: Hessie Knows, MD;  Location: [facility] ORS;  Service: Orthopedics;  Laterality: N/A;  . KYPHOPLASTY N/A 04/12/2018   Procedure: KYPHOPLASTY T7,8;  Surgeon: Hessie Knows, MD;  Location: [facility] ORS;  Service: Orthopedics;  Laterality: N/A;  . KYPHOPLASTY N/A 04/19/2018   Procedure: KYPHOPLASTY T5, T6;  Surgeon: Hessie Knows, MD;  Location: [facility] ORS;  Service: Orthopedics;  Laterality: N/A;  . LUNG SURGERY  1990 and 1996  . THOROCOTOMY WITH LOBECTOMY     LEFT LOWER THORACOTOMY / RIGHT MIDDLE LOBECTOMY     FAMILY HISTORY   Family History  Problem Relation Age of Onset  . Hypertension Mother   . Hypertension Father      SOCIAL HISTORY   Social  History   Tobacco Use  . Smoking status: Never  . Smokeless tobacco: Never  Vaping Use  . Vaping Use: Never used  Substance Use Topics  . Alcohol use: No  . Drug use: No     MEDICATIONS    Home Medication:  REM   Current Medication:  Current Facility-Administered Medications:  .  acetaminophen (TYLENOL) tablet 650 mg, 650 mg, Oral, Q6H PRN, 650 mg at 07/15/20 2148 **OR** acetaminophen (TYLENOL) suppository 650 mg, 650 mg, Rectal, Q6H PRN, Shalhoub, Sherryll Burger, MD .  apixaban (ELIQUIS) tablet 5 mg, 5 mg, Oral, BID, Shalhoub, Sherryll Burger, MD, 5 mg at 07/16/20 0914 .  ascorbic acid (VITAMIN C) tablet 500 mg, 500 mg, Oral, Daily, Shalhoub, Sherryll Burger, MD, 500 mg at 07/16/20 0900 .  azithromycin (ZITHROMAX) 500 mg in sodium chloride 0.9 % 250 mL IVPB, 500 mg, Intravenous, Q24H, Shalhoub, Sherryll Burger, MD, Last Rate: 250 mL/hr at 07/16/20 1431, 500 mg at 07/16/20 1431 .  benzonatate (TESSALON) capsule 200 mg, 200 mg, Oral, TID PRN, Shalhoub, Sherryll Burger, MD .  bisacodyl (DULCOLAX) suppository 10 mg, 10 mg, Rectal, Once, Ottie Glazier, MD .  calcium carbonate (TUMS - dosed in mg elemental calcium) chewable tablet 200 mg of elemental calcium, 1 tablet, Oral, TID PRN, Wyvonnia Dusky, MD, 200 mg of elemental calcium at 07/15/20 1251 .  [COMPLETED] ceFEPIme (MAXIPIME) 1 g in sodium chloride 0.9 % 100 mL IVPB, 1 g, Intravenous, Once, Stopped at 07/14/20 1557 **FOLLOWED BY** ceFEPIme (MAXIPIME) 2 g in sodium chloride 0.9 % 100 mL IVPB, 2 g, Intravenous, Q12H, Dorothe Pea, RPH, Last Rate: 200 mL/hr at 07/16/20 1320, 2 g at 07/16/20 1320 .  dexamethasone (DECADRON) injection 4 mg, 4 mg, Intravenous, Q24H, Lanney Gins, Delvina Mizzell, MD, 4 mg at 07/15/20 1505 .  digoxin (LANOXIN) tablet 125 mcg, 125 mcg, Oral, Daily, Shalhoub, Sherryll Burger, MD, 125 mcg at 07/16/20 0901 .  diltiazem (CARDIZEM CD) 24 hr capsule 180 mg, 180 mg, Oral, Daily, Dorothe Pea, RPH, 180 mg at 07/16/20 0903 .  ferrous sulfate tablet 325  mg, 325 mg, Oral, Daily, Shalhoub, Sherryll Burger, MD, 325 mg at 07/16/20 0900 .  gabapentin (NEURONTIN) capsule 300 mg, 300 mg, Oral, QID, Athena Masse, MD, 300 mg at 07/16/20 1433 .  glycopyrrolate (ROBINUL) tablet 1 mg, 1 mg, Oral, TID, Shalhoub, Sherryll Burger, MD, 1 mg at 07/16/20 0902 .  levalbuterol (XOPENEX HFA) inhaler 2 puff, 2 puff, Inhalation, Q6H, Shalhoub, Sherryll Burger, MD, 2 puff at 07/16/20 1433 .  levalbuterol (XOPENEX HFA) inhaler 2 puff, 2 puff, Inhalation, Q2H PRN, Shalhoub, Sherryll Burger, MD, 2 puff at 07/15/20 1252 .  LORazepam (ATIVAN) tablet 0.5 mg, 0.5 mg, Oral, QHS, Shalhoub, Sherryll Burger, MD, 0.5 mg at 07/15/20 2028 .  losartan (COZAAR) tablet 100 mg, 100 mg, Oral, Daily, Shalhoub, Sherryll Burger, MD, 100 mg at 07/16/20 0901 .  metoprolol succinate (TOPROL-XL) 24 hr tablet 100 mg,  100 mg, Oral, Daily, Shalhoub, Sherryll Burger, MD, 100 mg at 07/15/20 1018 .  multivitamin with minerals tablet 1 tablet, 1 tablet, Oral, Daily, Wyvonnia Dusky, MD, 1 tablet at 07/16/20 0900 .  ondansetron (ZOFRAN) tablet 4 mg, 4 mg, Oral, Q6H PRN **OR** ondansetron (ZOFRAN) injection 4 mg, 4 mg, Intravenous, Q6H PRN, Shalhoub, Sherryll Burger, MD .  pantoprazole (PROTONIX) EC tablet 40 mg, 40 mg, Oral, Daily, Shalhoub, Sherryll Burger, MD, 40 mg at 07/16/20 0901 .  polyethylene glycol (MIRALAX / GLYCOLAX) packet 17 g, 17 g, Oral, Daily PRN, Vernelle Emerald, MD, 17 g at 07/15/20 1033 .  [COMPLETED] remdesivir 200 mg in sodium chloride 0.9% 250 mL IVPB, 200 mg, Intravenous, Once, Stopped at 07/14/20 1801 **FOLLOWED BY** remdesivir 100 mg in sodium chloride 0.9 % 100 mL IVPB, 100 mg, Intravenous, Daily, Shalhoub, Sherryll Burger, MD, Stopped at 07/16/20 [phone] .  senna (SENOKOT) tablet 17.2 mg, 2 tablet, Oral, QHS, Shalhoub, Sherryll Burger, MD, 17.2 mg at 07/15/20 2028 .  zinc sulfate capsule 220 mg, 220 mg, Oral, Daily, Shalhoub, Sherryll Burger, MD, 220 mg at 07/16/20 0902    ALLERGIES   Codeine, Sulfa antibiotics, and Penicillins     REVIEW OF  SYSTEMS    Review of Systems:  Gen:  Denies  fever, sweats, chills weigh loss  HEENT: Denies blurred vision, double vision, ear pain, eye pain, hearing loss, nose bleeds, sore throat Cardiac:  No dizziness, chest pain or heaviness, chest tightness,edema Resp:   Denies cough or sputum porduction, shortness of breath,wheezing, hemoptysis,  Gi: Denies swallowing difficulty, stomach pain, nausea or vomiting, diarrhea, constipation, bowel incontinence Gu:  Denies bladder incontinence, burning urine Ext:   Denies Joint pain, stiffness or swelling Skin: Denies  skin rash, easy bruising or bleeding or hives Endoc:  Denies polyuria, polydipsia , polyphagia or weight change Psych:   Denies depression, insomnia or hallucinations   Other:  All other systems negative   VS: BP (!) 145/81 (BP Location: Left Arm)   Pulse (!) 57   Temp 97.6 F (36.4 C)   Resp 20   Ht '5\' 5"'$  (1.651 m)   Wt 52.3 kg Comment: bed weight  SpO2 95%   BMI 19.19 kg/m      PHYSICAL EXAM    GENERAL:NAD, no fevers, chills, no weakness no fatigue HEAD: Normocephalic, atraumatic.  EYES: Pupils equal, round, reactive to light. Extraocular muscles intact. No scleral icterus.  MOUTH: Moist mucosal membrane. Dentition intact. No abscess noted.  EAR, NOSE, THROAT: Clear without exudates. No external lesions.  NECK: Supple. No thyromegaly. No nodules. No JVD.  PULMONARY: rhonchi bilaterally  CARDIOVASCULAR: S1 and S2. Regular rate and rhythm. No murmurs, rubs, or gallops. No edema. Pedal pulses 2+ bilaterally.  GASTROINTESTINAL: Soft, nontender, nondistended. No masses. Positive bowel sounds. No hepatosplenomegaly.  MUSCULOSKELETAL: No swelling, clubbing, or edema. Range of motion full in all extremities.  NEUROLOGIC: Cranial nerves II through XII are intact. No gross focal neurological deficits. Sensation intact. Reflexes intact.  SKIN: No ulceration, lesions, rashes, or cyanosis. Skin warm and dry. Turgor intact.   PSYCHIATRIC: Mood, affect within normal limits. The patient is awake, alert and oriented x 3. Insight, judgment intact.       IMAGING    DG Chest 2 View  Result Date: 07/14/2020 CLINICAL DATA:  75 year old female with shortness of breath and cough. EXAM: CHEST - 2 VIEW COMPARISON:  Chest CT 04/29/2020 and earlier. FINDINGS: Seated AP and lateral views of the chest. Large chronic  left diaphragmatic hernia appears stable. Stable cardiac size and mediastinal contours. Bronchiectasis better demonstrated by CT. Chronic staple line in the right upper lobe. Stable coarse bilateral pulmonary interstitial opacity. No pneumothorax, pulmonary edema, or acute pulmonary opacity. Diffuse spinal compression fractures with at least 10 levels previously augmented. Stable visualized osseous structures. Negative visible bowel gas pattern. IMPRESSION: Chronic lung disease with bronchiectasis. Chronic left diaphragmatic hernia. No acute cardiopulmonary abnormality identified. Electronically Signed   By: Genevie Ann M.D.   On: 07/14/2020 11:54   CT Angio Chest PE W and/or Wo Contrast  Result Date: 07/14/2020 CLINICAL DATA:  PE suspected, high prob EXAM: CT ANGIOGRAPHY CHEST WITH CONTRAST TECHNIQUE: Multidetector CT imaging of the chest was performed using the standard protocol during bolus administration of intravenous contrast. Multiplanar CT image reconstructions and MIPs were obtained to evaluate the vascular anatomy. CONTRAST:  73m OMNIPAQUE IOHEXOL 350 MG/ML SOLN COMPARISON:  Chest CT 04/29/2020 FINDINGS: Cardiovascular: Satisfactory opacification of the pulmonary arteries to the segmental level. No evidence of pulmonary embolism. Unchanged enlarged central pulmonary arteries compatible with pulmonary hypertension. Unchanged mild cardiomegaly. Atherosclerotic calcifications of the aorta. Coronary artery calcifications. Mediastinum/Nodes: There is no mediastinal, hilar, or axillary lymphadenopathy. Thyroid is  unremarkable. There is a large hiatal hernia containing the a hepatic flexure of the colon and the stomach, similar to prior exam. Lungs/Pleura: Diffuse bronchial wall thickening with multifocal bronchiectasis, most severe in the right upper lobe and lung bases bilaterally. Multifocal mucous plugging. Similar architectural distortion in the of the left lower lobe. Prior resections of the lateral segment of the right upper lobe, the right middle lobe, and inferior lingula. There is small right-sided subpleural nodularity, increased from prior exam, largest measuring 2.0 x 0.6 cm, and another measuring 0.9 x 0.8 cm (series 7, images 53 and 41 respectively). Similar subpleural nodularity in the left upper lobe. Scattered lung scarring and tree-in-bud nodularity. Mosaic attenuation bilaterally, likely due to air trapping. Upper Abdomen: Unchanged left renal cyst. Prior cholecystectomy. No acute abnormality. Musculoskeletal: Unchanged multiple thoracolumbar compression deformities with vertebroplasties. Unchanged chronic T3 compression deformity. Partially visualized severe degenerative changes in the lower cervical spine. Review of the MIP images confirms the above findings. IMPRESSION: No evidence of pulmonary embolism. Unchanged enlarged pulmonary arteries suggestive of pulmonary hypertension. 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 Increased subpleural nodularity on the right, which may represent progression of disease. Chronic mosaic attenuation of the lungs likely due to air trapping. Recommend follow-up chest CT in 3 months and follow-up with pulmonology. Aortic Atherosclerosis (ICD10-I70.0). Electronically Signed   By: JMaurine Simmering  On: 07/14/2020 13:33      ASSESSMENT/PLAN    Acute exacerbation of bronchiectasis  - empirically on zithromax and cefepime for possible sepsis    -  agree with current antibiotics    Acute COVID19 pneumonia -Remdesevir antiviral - pharmacy protocol 5 d -vitamin C -zinc -decadron '6mg'$  IV daily  -- monitor UOP - utilize external urinary catheter if possible -encourage to use IS and Acapella device for bronchopulmonary hygiene when able -d/c hepatotoxic medications while on remdesevir -supportive care with ICU telemetry monitoring -PT/OT when possible -procalcitonin, CRP and ferritin trending -pt s/p Paxlovid therapy     Oral thrush -nystatin   Ecoli UTI  - continue current antibiotics   Chronic AF    - continue eliquis - no hemoptysis Digoxin    - currently rate controlled with BB and CchB  Thank you for allowing me to participate in the care of this patient.   Patient/Family are satisfied with care plan and all questions have been answered.   This document was prepared using Dragon voice recognition software and may include unintentional dictation errors.     Ottie Glazier, M.D.  Division of [place]

## 2020-07-17 DIAGNOSIS — N390 Urinary tract infection, site not specified: Secondary | ICD-10-CM | POA: Diagnosis not present

## 2020-07-17 DIAGNOSIS — A310 Pulmonary mycobacterial infection: Secondary | ICD-10-CM | POA: Diagnosis not present

## 2020-07-17 DIAGNOSIS — J471 Bronchiectasis with (acute) exacerbation: Secondary | ICD-10-CM | POA: Diagnosis not present

## 2020-07-17 LAB — CBC
HCT: 36.5 % (ref 36.0–46.0)
Hemoglobin: 12.7 g/dL (ref 12.0–15.0)
MCH: 33.2 pg (ref 26.0–34.0)
MCHC: 34.8 g/dL (ref 30.0–36.0)
MCV: 95.3 fL (ref 80.0–100.0)
Platelets: 315 10*3/uL (ref 150–400)
RBC: 3.83 MIL/uL — ABNORMAL LOW (ref 3.87–5.11)
RDW: 12.1 % (ref 11.5–15.5)
WBC: 10.5 10*3/uL (ref 4.0–10.5)
nRBC: 0 % (ref 0.0–0.2)

## 2020-07-17 LAB — BASIC METABOLIC PANEL
Anion gap: 6 (ref 5–15)
BUN: 17 mg/dL (ref 8–23)
CO2: 36 mmol/L — ABNORMAL HIGH (ref 22–32)
Calcium: 8.5 mg/dL — ABNORMAL LOW (ref 8.9–10.3)
Chloride: 89 mmol/L — ABNORMAL LOW (ref 98–111)
Creatinine, Ser: 0.48 mg/dL (ref 0.44–1.00)
GFR, Estimated: >60 mL/min (ref >60)
Glucose, Bld: 136 mg/dL — ABNORMAL HIGH (ref 70–99)
Potassium: 4.1 mmol/L (ref 3.5–5.1)
Sodium: 131 mmol/L — ABNORMAL LOW (ref 135–145)

## 2020-07-17 LAB — URINE CULTURE: Culture: >=100000 — AB

## 2020-07-17 LAB — C-REACTIVE PROTEIN: CRP: 3.3 mg/dL — ABNORMAL HIGH (ref <1.0)

## 2020-07-17 LAB — FERRITIN: Ferritin: 256 ng/mL (ref 11–307)

## 2020-07-17 MED ORDER — SODIUM CHLORIDE 0.9 % IV SOLN
1.0000 g | Freq: Three times a day (TID) | INTRAVENOUS | Status: DC
  Administered 2020-07-17 – 2020-07-20 (×9): 1 g via INTRAVENOUS
  Filled 2020-07-17 (×12): qty 1

## 2020-07-17 NOTE — Progress Notes (Signed)
Pulmonary and Critical Care Medicine          Date: 07/17/2020,   MRN# [id number] Ana Washington 06-18-1945     AdmissionWeight: 49.9 kg                 CurrentWeight: 52.3 kg (bed weight)   Referring physician: Sherrie George FNP   CHIEF COMPLAINT:   Acute exacerbation of bronchiectasis due to [place]   This is a very pleasant female with hx of permanent AF, severe bronchiectasis chronically with chronic hypoxemia.  She uses Tobramycin nebulized therapy daily and has been on cipro po in the past for >20 years.  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  She is generally on 3L/min nasal canula and is slowly ambulatory.  She had mechanical fall last year and broke her foot which further hindered her functional status but is not demented.    07/15/20- Patient is improved, she is smiling during my interview.  She still is very ronchorous bilaterally but overall interval improvement. Plan to continue current care plan with estimated DC post Gre[place] IV therapy completion.   07/16/20- Patient has oral thrush with sore throat and ecoli UTI.  I discussed her blood work and findings. Weaning to 2L/min.  She walked to door and back twice today  07/17/20- patient reports burning and cold chills while infusion of maxipime is running. Will dc this today and prescribe levoquin  PAST MEDICAL HISTORY   Past Medical History:  Diagnosis Date  . Arthritis   . Atrial fibrillation ([place])   . Bronchiectasis ([place])   . CAD (coronary artery disease) 07/30/2017  . Dumping syndrome   . 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 Family history of adverse reaction to anesthesia    sister [name]  . GERD (gastroesophageal reflux disease)   . Headache    MIGRAINES  . Myocardial infarction ([place]) 2007   Non-STEMI  . [name] (postoperative nausea and vomiting)   . Psoriasis   . PUD (peptic  ulcer disease)      SURGICAL HISTORY   Past Surgical History:  Procedure Laterality Date  . BACK SURGERY    . CHOLECYSTECTOMY    . ESOPHAGOGASTRODUODENOSCOPY (EGD) WITH PROPOFOL N/A 03/19/2019   Procedure: ESOPHAGOGASTRODUODENOSCOPY (EGD) WITH PROPOFOL;  Surgeon: [name], MD;  Location: [facility] ENDOSCOPY;  Service: Gastroenterology;  Laterality: N/A;  *Note to anesthesia: Per pt's pulmonologist, if intubating, please extubate to BIPAP.  [name] EYE SURGERY    . FOOT SURGERY    . INTRAMEDULLARY (IM) NAIL INTERTROCHANTERIC Right 09/30/2018   Procedure: INTRAMEDULLARY (IM) NAIL INTERTROCHANTRIC;  Surgeon: Dereck Leep, MD;  Location: [facility] ORS;  Service: Orthopedics;  Laterality: Right;  . KYPHOPLASTY N/A 07/05/2016   Procedure: KYPHOPLASTY T - 9;  Surgeon: Hessie Knows, MD;  Location: [facility] ORS;  Service: Orthopedics;  Laterality: N/A;  . KYPHOPLASTY N/A 11/29/2017   Procedure: Iona Hansen;  Surgeon: Hessie Knows, MD;  Location: [facility] ORS;  Service: Orthopedics;  Laterality: N/A;  L2 and L3  . KYPHOPLASTY N/A 12/18/2017   Procedure: KYPHOPLASTY L1;  Surgeon: Hessie Knows, MD;  Location: [facility] ORS;  Service: Orthopedics;  Laterality: N/A;  . KYPHOPLASTY N/A 01/05/2018   Procedure: KYPHOPLASTY-T11,T12;  Surgeon: Hessie Knows, MD;  Location: [facility] ORS;  Service: Orthopedics;  Laterality: N/A;  . KYPHOPLASTY N/A 04/05/2018   Procedure: T10 KYPHOPLASTY;  Surgeon: Hessie Knows, MD;  Location: [facility] ORS;  Service:  Orthopedics;  Laterality: N/A;  . KYPHOPLASTY N/A 04/12/2018   Procedure: KYPHOPLASTY T7,8;  Surgeon: Hessie Knows, MD;  Location: [facility] ORS;  Service: Orthopedics;  Laterality: N/A;  . KYPHOPLASTY N/A 04/19/2018   Procedure: KYPHOPLASTY T5, T6;  Surgeon: Hessie Knows, MD;  Location: [facility] ORS;  Service: Orthopedics;  Laterality: N/A;  . LUNG SURGERY  1990 and 1996  . THOROCOTOMY WITH LOBECTOMY     LEFT LOWER THORACOTOMY / RIGHT MIDDLE LOBECTOMY     FAMILY HISTORY   Family History   Problem Relation Age of Onset  . Hypertension Mother   . Hypertension Father      SOCIAL HISTORY   Social History   Tobacco Use  . Smoking status: Never  . Smokeless tobacco: Never  Vaping Use  . Vaping Use: Never used  Substance Use Topics  . Alcohol use: No  . Drug use: No     MEDICATIONS    Home Medication:  REM   Current Medication:  Current Facility-Administered Medications:  .  acetaminophen (TYLENOL) tablet 650 mg, 650 mg, Oral, Q6H PRN, 650 mg at 07/16/20 2122 **OR** acetaminophen (TYLENOL) suppository 650 mg, 650 mg, Rectal, Q6H PRN, Shalhoub, Sherryll Burger, MD .  apixaban (ELIQUIS) tablet 5 mg, 5 mg, Oral, BID, Shalhoub, Sherryll Burger, MD, 5 mg at 07/16/20 2122 .  ascorbic acid (VITAMIN C) tablet 500 mg, 500 mg, Oral, Daily, Shalhoub, Sherryll Burger, MD, 500 mg at 07/16/20 0900 .  azithromycin (ZITHROMAX) 500 mg in sodium chloride 0.9 % 250 mL IVPB, 500 mg, Intravenous, Q24H, Shalhoub, Sherryll Burger, MD, Stopped at 07/16/20 1701 .  benzonatate (TESSALON) capsule 200 mg, 200 mg, Oral, TID PRN, Shalhoub, Sherryll Burger, MD .  bisacodyl (DULCOLAX) suppository 10 mg, 10 mg, Rectal, Once, Ottie Glazier, MD .  calcium carbonate (TUMS - dosed in mg elemental calcium) chewable tablet 200 mg of elemental calcium, 1 tablet, Oral, TID PRN, Wyvonnia Dusky, MD, 200 mg of elemental calcium at 07/15/20 1251 .  [COMPLETED] ceFEPIme (MAXIPIME) 1 g in sodium chloride 0.9 % 100 mL IVPB, 1 g, Intravenous, Once, Stopped at 07/14/20 1557 **FOLLOWED BY** ceFEPIme (MAXIPIME) 2 g in sodium chloride 0.9 % 100 mL IVPB, 2 g, Intravenous, Q12H, Dorothe Pea, RPH, Stopped at 07/17/20 0104 .  dexamethasone (DECADRON) injection 4 mg, 4 mg, Intravenous, Q24H, Lanney Gins, Davinci Glotfelty, MD, 4 mg at 07/16/20 1721 .  digoxin (LANOXIN) tablet 125 mcg, 125 mcg, Oral, Daily, Shalhoub, Sherryll Burger, MD, 125 mcg at 07/16/20 0901 .  diltiazem (CARDIZEM CD) 24 hr capsule 180 mg, 180 mg, Oral, Daily, Dorothe Pea, RPH, 180 mg at  07/16/20 0903 .  ferrous sulfate tablet 325 mg, 325 mg, Oral, Daily, Shalhoub, Sherryll Burger, MD, 325 mg at 07/16/20 0900 .  gabapentin (NEURONTIN) capsule 300 mg, 300 mg, Oral, QID, Athena Masse, MD, 300 mg at 07/16/20 2122 .  glycopyrrolate (ROBINUL) tablet 1 mg, 1 mg, Oral, TID, Shalhoub, Sherryll Burger, MD, 1 mg at 07/16/20 2130 .  levalbuterol [facility] HFA) inhaler 2 puff, 2 puff, Inhalation, Q6H, Shalhoub, Sherryll Burger, MD, 2 puff at 07/16/20 2123 .  levalbuterol (XOPENEX HFA) inhaler 2 puff, 2 puff, Inhalation, Q2H PRN, Shalhoub, Sherryll Burger, MD, 2 puff at 07/15/20 1252 .  LORazepam (ATIVAN) tablet 0.5 mg, 0.5 mg, Oral, QHS, Shalhoub, Sherryll Burger, MD, 0.5 mg at 07/16/20 2122 .  losartan (COZAAR) tablet 100 mg, 100 mg, Oral, Daily, Shalhoub, Sherryll Burger, MD, 100 mg at 07/16/20 0901 .  metoprolol succinate (TOPROL-XL) 24  hr tablet 100 mg, 100 mg, Oral, Daily, Shalhoub, Sherryll Burger, MD, 100 mg at 07/15/20 1018 .  multivitamin with minerals tablet 1 tablet, 1 tablet, Oral, Daily, Wyvonnia Dusky, MD, 1 tablet at 07/16/20 0900 .  nystatin (MYCOSTATIN) 100000 UNIT/ML suspension 500,000 Units, 5 mL, Oral, QID, Lanney Gins, Jaelee Laughter, MD, 500,000 Units at 07/16/20 2131 .  ondansetron (ZOFRAN) tablet 4 mg, 4 mg, Oral, Q6H PRN **OR** ondansetron (ZOFRAN) injection 4 mg, 4 mg, Intravenous, Q6H PRN, Shalhoub, Sherryll Burger, MD .  pantoprazole (PROTONIX) EC tablet 40 mg, 40 mg, Oral, Daily, Shalhoub, Sherryll Burger, MD, 40 mg at 07/16/20 0901 .  polyethylene glycol (MIRALAX / GLYCOLAX) packet 17 g, 17 g, Oral, Daily PRN, Vernelle Emerald, MD, 17 g at 07/15/20 1033 .  [COMPLETED] remdesivir 200 mg in sodium chloride 0.9% 250 mL IVPB, 200 mg, Intravenous, Once, Stopped at 07/14/20 1801 **FOLLOWED BY** remdesivir 100 mg in sodium chloride 0.9 % 100 mL IVPB, 100 mg, Intravenous, Daily, Shalhoub, Sherryll Burger, MD, Stopped at 07/16/20 [phone] .  senna (SENOKOT) tablet 17.2 mg, 2 tablet, Oral, QHS, Shalhoub, Sherryll Burger, MD, 17.2 mg at 07/16/20 2122 .  zinc  sulfate capsule 220 mg, 220 mg, Oral, Daily, Shalhoub, Sherryll Burger, MD, 220 mg at 07/16/20 0902    ALLERGIES   Codeine, Sulfa antibiotics, and Penicillins     REVIEW OF SYSTEMS    Review of Systems:  Gen:  Denies  fever, sweats, chills weigh loss  HEENT: Denies blurred vision, double vision, ear pain, eye pain, hearing loss, nose bleeds, sore throat Cardiac:  No dizziness, chest pain or heaviness, chest tightness,edema Resp:   Denies cough or sputum porduction, shortness of breath,wheezing, hemoptysis,  Gi: Denies swallowing difficulty, stomach pain, nausea or vomiting, diarrhea, constipation, bowel incontinence Gu:  Denies bladder incontinence, burning urine Ext:   Denies Joint pain, stiffness or swelling Skin: Denies  skin rash, easy bruising or bleeding or hives Endoc:  Denies polyuria, polydipsia , polyphagia or weight change Psych:   Denies depression, insomnia or hallucinations   Other:  All other systems negative   VS: BP (!) 162/83 (BP Location: Left Arm)   Pulse 63   Temp 97.7 F (36.5 C)   Resp 18   Ht '5\' 5"'$  (1.651 m)   Wt 52.3 kg Comment: bed weight  SpO2 96%   BMI 19.19 kg/m      PHYSICAL EXAM    GENERAL:NAD, no fevers, chills, no weakness no fatigue HEAD: Normocephalic, atraumatic.  EYES: Pupils equal, round, reactive to light. Extraocular muscles intact. No scleral icterus.  MOUTH: Moist mucosal membrane. Dentition intact. No abscess noted.  EAR, NOSE, THROAT: Clear without exudates. No external lesions.  NECK: Supple. No thyromegaly. No nodules. No JVD.  PULMONARY: rhonchi bilaterally  CARDIOVASCULAR: S1 and S2. Regular rate and rhythm. No murmurs, rubs, or gallops. No edema. Pedal pulses 2+ bilaterally.  GASTROINTESTINAL: Soft, nontender, nondistended. No masses. Positive bowel sounds. No hepatosplenomegaly.  MUSCULOSKELETAL: No swelling, clubbing, or edema. Range of motion full in all extremities.  NEUROLOGIC: Cranial nerves II through XII are  intact. No gross focal neurological deficits. Sensation intact. Reflexes intact.  SKIN: No ulceration, lesions, rashes, or cyanosis. Skin warm and dry. Turgor intact.  PSYCHIATRIC: Mood, affect within normal limits. The patient is awake, alert and oriented x 3. Insight, judgment intact.       IMAGING    DG Chest 2 View  Result Date: 07/14/2020 CLINICAL DATA:  75 year old female with shortness of breath and  cough. EXAM: CHEST - 2 VIEW COMPARISON:  Chest CT 04/29/2020 and earlier. FINDINGS: Seated AP and lateral views of the chest. Large chronic left diaphragmatic hernia appears stable. Stable cardiac size and mediastinal contours. Bronchiectasis better demonstrated by CT. Chronic staple line in the right upper lobe. Stable coarse bilateral pulmonary interstitial opacity. No pneumothorax, pulmonary edema, or acute pulmonary opacity. Diffuse spinal compression fractures with at least 10 levels previously augmented. Stable visualized osseous structures. Negative visible bowel gas pattern. IMPRESSION: Chronic lung disease with bronchiectasis. Chronic left diaphragmatic hernia. No acute cardiopulmonary abnormality identified. Electronically Signed   By: Genevie Ann M.D.   On: 07/14/2020 11:54   CT Angio Chest PE W and/or Wo Contrast  Result Date: 07/14/2020 CLINICAL DATA:  PE suspected, high prob EXAM: CT ANGIOGRAPHY CHEST WITH CONTRAST TECHNIQUE: Multidetector CT imaging of the chest was performed using the standard protocol during bolus administration of intravenous contrast. Multiplanar CT image reconstructions and MIPs were obtained to evaluate the vascular anatomy. CONTRAST:  63m OMNIPAQUE IOHEXOL 350 MG/ML SOLN COMPARISON:  Chest CT 04/29/2020 FINDINGS: Cardiovascular: Satisfactory opacification of the pulmonary arteries to the segmental level. No evidence of pulmonary embolism. Unchanged enlarged central pulmonary arteries compatible with pulmonary hypertension. Unchanged mild cardiomegaly.  Atherosclerotic calcifications of the aorta. Coronary artery calcifications. Mediastinum/Nodes: There is no mediastinal, hilar, or axillary lymphadenopathy. Thyroid is unremarkable. There is a large hiatal hernia containing the a hepatic flexure of the colon and the stomach, similar to prior exam. Lungs/Pleura: Diffuse bronchial wall thickening with multifocal bronchiectasis, most severe in the right upper lobe and lung bases bilaterally. Multifocal mucous plugging. Similar architectural distortion in the of the left lower lobe. Prior resections of the lateral segment of the right upper lobe, the right middle lobe, and inferior lingula. There is small right-sided subpleural nodularity, increased from prior exam, largest measuring 2.0 x 0.6 cm, and another measuring 0.9 x 0.8 cm (series 7, images 53 and 41 respectively). Similar subpleural nodularity in the left upper lobe. Scattered lung scarring and tree-in-bud nodularity. Mosaic attenuation bilaterally, likely due to air trapping. Upper Abdomen: Unchanged left renal cyst. Prior cholecystectomy. No acute abnormality. Musculoskeletal: Unchanged multiple thoracolumbar compression deformities with vertebroplasties. Unchanged chronic T3 compression deformity. Partially visualized severe degenerative changes in the lower cervical spine. Review of the MIP images confirms the above findings. IMPRESSION: No evidence of pulmonary embolism. Unchanged enlarged pulmonary arteries suggestive of pulmonary hypertension. 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 Increased subpleural nodularity on the right, which may represent progression of disease. Chronic mosaic attenuation of the lungs likely due to air trapping. Recommend follow-up chest CT in 3 months and follow-up with pulmonology. Aortic Atherosclerosis (ICD10-I70.0). Electronically Signed   By: JMaurine Simmering  On: 07/14/2020 13:33      ASSESSMENT/PLAN    Acute exacerbation of bronchiectasis  - empirically on zithromax and cefepime for possible sepsis    - agree with current antibiotics    Acute COVID19 pneumonia -Remdesevir antiviral - pharmacy protocol 5 d -vitamin C -zinc -decadron '6mg'$  IV daily  -- monitor UOP - utilize external urinary catheter if possible -encourage to use IS and Acapella device for bronchopulmonary hygiene when able -d/c hepatotoxic medications while on remdesevir -supportive care with ICU telemetry monitoring -PT/OT when possible -procalcitonin, CRP and ferritin trending -pt s/p Paxlovid therapy     Oral thrush -nystatin    Ecoli UTI  - patient rports adverse effect to cefepime, she  was on levoquin prior with good efficacy for bronchiectasis without side effects and this abx will work for uti as well.    Chronic AF    - continue eliquis - no hemoptysis Digoxin    - currently rate controlled with BB and CchB       Thank you for allowing me to participate in the care of this patient.   Patient/Family are satisfied with care plan and all questions have been answered.   This document was prepared using Dragon voice recognition software and may include unintentional dictation errors.     Ottie Glazier, M.D.  Division of [place]

## 2020-07-17 NOTE — Care Management Important Message (Signed)
Important Message  Patient Details  Name: Ana Washington MRN: [id number] Date of Birth: 13-Jan-1946   Medicare Important Message Given:  Yes - Important Message mailed due to current National Emergency  Reviewed Medicare IM with patient via cell phone due to isolation status.  Copy of Medicare IM sent securely to email address on file: [email or web address].     Dannette Barbara 07/17/2020, 1:41 PM

## 2020-07-17 NOTE — Progress Notes (Addendum)
Attempted call into room for high risk screening. Unable to reach patient. Asked RN to ask patient to call CSW when able.  10:30- Attempted call into room again. No answer.  Oleh Genin, [place]

## 2020-07-17 NOTE — Progress Notes (Signed)
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 Continue on IV merrem, IV steroids, bronchodilators and encourage incentive spirometry. Pulmon following and recs apprec   COVID19 infection: continue on IV remdesivir, IV steroids and bronchodilators. Had an allergic reaction to paxlovid early in her course of illness & sensation of her throat swelling up at that time   UTI: urine cx is growing ESBL e. Coli. Abxs changed to IV merrem   Possibly dysphagia: continue on dysphagia III diet as per speech   Hyponatremia: continues to trend up daily. Will continue to monitor   Lactic acidosis: resolved  Sepsis: met criteria w/ tachycardia, luekocytosis, fever & COVID19 infection & UTI. Resolved   HTN: continue on ARB, CCB & BB   PAF: continue on eliquis, CCB, & BB   Hx of CAD: w/ minimally elevated troponins likely secondary to demand ischemia  MAC:  continue on IV merrem as per pulmon. Pulmon recs apprec   GERD: continue on PPI    DVT prophylaxis: eliquis  Code Status: DNR  Family Communication:  Disposition Plan: likely d/c back home w/ home health   Level of care: Med-Surg  Status is: Inpatient  Remains inpatient appropriate because:Unsafe d/c plan, IV treatments  appropriate due to intensity of illness or inability to take PO, and Inpatient level of care appropriate due to severity of illness  Dispo: The patient is from: Home              Anticipated d/c is to: Home vs SNF               Patient currently is not medically stable to d/c.   Difficult to place patient: unclear       Consultants:  Pulmon   Procedures:   Antimicrobials: merrem    Subjective: Pt still c/o burning w/ urination   Objective: Vitals:   07/16/20 1736 07/16/20 2039 07/17/20 0032 07/17/20 0526  BP: 133/85 (!) 142/81 136/80 (!) 162/83  Pulse: (!) 55 64 (!) 56 63  Resp: _0 Temp: 97.7 F (36.5 C) 97.8 F (36.6 C) 97.7 F (36.5 C) 97.7 F (36.5 C)  TempSrc:      SpO2: 100% 99% 99% 96%  Weight:      Height:        Intake/Output Summary (Last 24 hours) at 07/17/2020 0803 Last data filed at 07/17/2020 0527 Gross per 24 hour  Intake 790 ml  Output 1800 ml  Net -1010 ml   Filed Weights   07/14/20 1113 07/15/20 1532  Weight: 49.9 kg 52.3 kg    Examination:  General exam: Appears uncomfortable  Respiratory system: diminished breath sounds b/l. Tachypnea Cardiovascular system: S1 &S2+. No rubs or clicks  Gastrointestinal system: Abd is soft, NT, ND & hypoactive bowel sounds  Central nervous system: Alert and oriented. Moves all  extremities  Psychiatry: judgement and insight appear normal. Flat mood and affect    Data Reviewed: I have personally reviewed following labs and imaging studies  CBC: Recent Labs  Lab 07/14/20 1118 07/15/20 0638 07/16/20 0530 07/17/20 0511  WBC 16.3* 11.7* 10.1 10.5  NEUTROABS  --  10.7*  --   --   HGB 14.2 12.3 11.8* 12.7  HCT 40.9 35.0* 33.4* 36.5  MCV 96.2 93.1 94.6 95.3  PLT 349 268 283 194   Basic Metabolic Panel: Recent Labs  Lab 07/14/20 1118 07/15/20 0638 07/16/20 0530 07/17/20 0511  NA 129* 128* 129* 131*  K 4.1 3.5 4.1 4.1  CL 83* 85* 89* 89*  CO2 37* 34* 37* 36*  GLUCOSE 87 112* 150*  136*  BUN _0 CREATININE 0.58 0.48 0.47 0.48  CALCIUM 9.3 7.7* 8.3* 8.5*  MG  --  1.7  --   --    GFR: Estimated Creatinine Clearance: 50.2 mL/min (by C-G formula based on SCr of 0.48 mg/dL). Liver Function Tests: Recent Labs  Lab 07/14/20 1118 07/15/20 0638  AST 30 27  ALT 20 17  ALKPHOS 50 36*  BILITOT 0.7 0.6  PROT 6.9 5.2*  ALBUMIN 3.9 2.5*   No results for input(s): LIPASE, AMYLASE in the last 168 hours. No results for input(s): AMMONIA in the last 168 hours. Coagulation Profile: Recent Labs  Lab 07/14/20 1158  INR 1.0   Cardiac Enzymes: No results for input(s): CKTOTAL, CKMB, CKMBINDEX, TROPONINI in the last 168 hours. BNP (last 3 results) No results for input(s): PROBNP in the last 8760 hours. HbA1C: No results for input(s): HGBA1C in the last 72 hours. CBG: No results for input(s): GLUCAP in the last 168 hours. Lipid Profile: No results for input(s): CHOL, HDL, LDLCALC, TRIG, CHOLHDL, LDLDIRECT in the last 72 hours. Thyroid Function Tests: No results for input(s): TSH, T4TOTAL, FREET4, T3FREE, THYROIDAB in the last 72 hours. Anemia Panel: Recent Labs    07/16/20 0530 07/17/20 0511  FERRITIN 258 256   Sepsis Labs: Recent Labs  Lab 07/14/20 1158 07/14/20 1335  PROCALCITON <0.10  --   LATICACIDVEN 2.5* 1.7    Recent Results (from the past 240 hour(s))  Blood culture (routine x 2)     Status: None (Preliminary result)   Collection Time: 07/14/20 11:58 AM   Specimen: BLOOD  Result Value Ref Range Status   Specimen Description BLOOD LEFT ANTECUBITAL  Final   Special Requests   Final    BOTTLES DRAWN AEROBIC AND ANAEROBIC Blood Culture adequate volume   Culture   Final    NO GROWTH 2 DAYS Performed at [facility], [address] [place], [place] [address]    Report Status PENDING  Incomplete  Blood culture (routine x 2)     Status: None (Preliminary result)   Collection Time: 07/14/20 12:17 PM   Specimen: BLOOD  Result  Value Ref Range Status   Specimen Description BLOOD BLOOD LEFT ARM  Final   Special Requests   Final    BOTTLES DRAWN AEROBIC AND ANAEROBIC Blood Culture adequate volume   Culture   Final    NO GROWTH 2 DAYS Performed at [facility], [address] [place], [place] [address]    Report Status PENDING  Incomplete  Resp Panel by RT-PCR (Flu A&B, Covid) Urine, Clean Catch     Status: Abnormal   Collection Time: 07/14/20  2:03 PM   Specimen: Urine, Clean Catch; Nasopharyngeal(NP) swabs in  vial transport medium  Result Value Ref Range Status   SARS Coronavirus 2 by RT PCR POSITIVE (A) NEGATIVE Final    Comment: RESULT CALLED TO, READ BACK BY AND VERIFIED WITH: ZACK REGISTER _0  07/14/20 MJU (NOTE) SARS-CoV-2 target nucleic acids are DETECTED.  The SARS-CoV-2 RNA is generally detectable in upper respiratory specimens during the acute phase of infection. Positive results are indicative of the presence of the identified virus, but do not rule out bacterial infection or co-infection with other pathogens not detected by the test. Clinical correlation with patient history and other diagnostic information is necessary to determine patient infection status. The expected result is Negative.  Fact Sheet for Patients: EntrepreneurPulse.com.au  Fact Sheet for Healthcare Providers: IncredibleEmployment.be  This test is not yet approved or cleared by the [place] FDA and  has been authorized for detection and/or diagnosis of SARS-CoV-2 by FDA under an Emergency Use Authorization (EUA).  This EUA will remain in effect (meaning this test can be u sed) for the duration of  the COVID-19 declaration under Section 564(b)(1) of the Act, 21 U.S.C. section 360bbb-3(b)(1), unless the authorization is terminated or revoked sooner.     Influenza A by PCR NEGATIVE NEGATIVE Final   Influenza B by PCR NEGATIVE NEGATIVE Final    Comment: (NOTE) The Xpert  Xpress SARS-CoV-2/FLU/RSV plus assay is intended as an aid in the diagnosis of influenza from Nasopharyngeal swab specimens and should not be used as a sole basis for treatment. Nasal washings and aspirates are unacceptable for Xpert Xpress SARS-CoV-2/FLU/RSV testing.  Fact Sheet for Patients: EntrepreneurPulse.com.au  Fact Sheet for Healthcare Providers: IncredibleEmployment.be  This test is not yet approved or cleared by the [place] FDA and has been authorized for detection and/or diagnosis of SARS-CoV-2 by FDA under an Emergency Use Authorization (EUA). This EUA will remain in effect (meaning this test can be used) for the duration of the COVID-19 declaration under Section 564(b)(1) of the Act, 21 U.S.C. section 360bbb-3(b)(1), unless the authorization is terminated or revoked.  Performed at [facility], [address] [place], [place] [address]   Urine culture     Status: Abnormal (Preliminary result)   Collection Time: 07/14/20  2:06 PM   Specimen: In/Out Cath Urine  Result Value Ref Range Status   Specimen Description   Final    IN/OUT CATH URINE Performed at [facility], [address] [place], [place] [address]    Special Requests   Final    NONE Performed at [facility], [address] [place], [place] [address]    Culture (A)  Final    >=100,000 COLONIES/mL ESCHERICHIA COLI SUSCEPTIBILITIES TO FOLLOW CULTURE REINCUBATED FOR BETTER GROWTH Performed at [facility], [place] [address], [place], [place] [address]    Report Status PENDING  Incomplete         Radiology Studies: No results found.      Scheduled Meds:  apixaban  5 mg Oral BID   vitamin C  500 mg Oral Daily   bisacodyl  10 mg Rectal Once   dexamethasone (DECADRON) injection  4 mg Intravenous Q24H   digoxin  125 mcg Oral Daily   diltiazem  180 mg Oral Daily   ferrous sulfate  325 mg Oral Daily   gabapentin   300 mg Oral QID   glycopyrrolate  1 mg Oral TID   levalbuterol  2 puff Inhalation Q6H   LORazepam  0.5 mg Oral QHS   losartan  100 mg Oral Daily   metoprolol succinate  100 mg Oral Daily   multivitamin with minerals  1 tablet Oral Daily   nystatin  5 mL Oral QID   pantoprazole  40 mg Oral Daily   senna  2 tablet Oral QHS   zinc sulfate  220 mg Oral Daily   Continuous Infusions:  azithromycin Stopped (07/16/20 1701)   ceFEPime (MAXIPIME) IV Stopped (07/17/20 0104)   remdesivir 100 mg in NS 100 mL Stopped (07/16/20 0929)     LOS: 2 days    Time spent: 33 mins     Wyvonnia Dusky, MD [facility] Pager [phone]  If 7PM-7AM, please contact night-coverage  07/17/2020, 8:03 AM

## 2020-07-17 NOTE — TOC Initial Note (Signed)
Transition of Care [facility]) - Initial/Assessment Note    Patient Details  Name: Ana Washington MRN: [id number] Date of Birth: Jun 19, 1945  Transition of Care [facility]) CM/SW Contact:    Magnus Ivan, LCSW Phone Number: 07/17/2020, 1:56 PM  Clinical Narrative:      CSW completed high risk screening with patient via phone due to [place] isolation. Patient lives with her husband. Patient reported they have a paid sitter from 9-1 5 days/week (helps with meal prep, transport, etc), son checks on them in the evenings and fixes them dinner, and her sister lives next door and checks in on nights and weekends/as needed. PCP is Dr. Kary Kos. Patient has a RW, 3 in 1, and ramp at home. Patient is active with [facility]. Patient reported she has no steps in her home. Sister provides transportation. Patient reported she has a good home set up and denied TOC needs at this time.         Expected Discharge Plan: [place] Barriers to Discharge: Continued Medical Work up   Patient Goals and CMS Choice Patient states their goals for this hospitalization and ongoing recovery are:: home with home health CMS Medicare.gov Compare Post Acute Care list provided to:: Patient Choice offered to / list presented to : Patient  Expected Discharge Plan and Services Expected Discharge Plan: [place]       Living arrangements for the past 2 months: Single Family Home                                      Prior Living Arrangements/Services Living arrangements for the past 2 months: Single Family Home Lives with:: Spouse Patient language and need for interpreter reviewed:: Yes Do you feel safe going back to the place where you live?: Yes      Need for Family Participation in Patient Care: Yes (Comment) Care giver support system in place?: Yes (comment) Current home services: DME Criminal Activity/Legal Involvement Pertinent to Current Situation/Hospitalization:  No - Comment as needed  Activities of Daily Living Home Assistive Devices/Equipment: Shower chair with back, Environmental consultant (specify type) ADL Screening (condition at time of admission) Patient's cognitive ability adequate to safely complete daily activities?: Yes Is the patient deaf or have difficulty hearing?: No Does the patient have difficulty seeing, even when wearing glasses/contacts?: No Does the patient have difficulty concentrating, remembering, or making decisions?: No Patient able to express need for assistance with ADLs?: Yes Does the patient have difficulty dressing or bathing?: No Independently performs ADLs?: Yes (appropriate for developmental age) (minimal assist with caregiver) Does the patient have difficulty walking or climbing stairs?: Yes Weakness of Legs: None Weakness of Arms/Hands: None  Permission Sought/Granted Permission sought to share information with : Facility Sport and exercise psychologist, Family Supports Permission granted to share information with : Yes, Verbal Permission Granted     Permission granted to share info w AGENCY: [facility]        Emotional Assessment       Orientation: : Oriented to Self, Oriented to Place, Oriented to  Time, Oriented to Situation Alcohol / Substance Use: Not Applicable Psych Involvement: No (comment)  Admission diagnosis:  Acute exacerbation of bronchiectasis ([place]) [J47.1] Sepsis without acute organ dysfunction, due to unspecified organism ([place]) [A41.9] Bronchiectasis ([place]) [J47.9] Patient Active Problem List   Diagnosis Date Noted   Acute exacerbation of bronchiectasis (  [place]) 07/14/2020   COVID-19 virus infection 07/14/2020   Lactic acidosis 07/14/2020   SIRS (systemic inflammatory response syndrome) ([place]) 07/14/2020   Elevated troponin level not due myocardial infarction 07/14/2020   Anxiety    Constipation    Pressure injury of right heel, stage 1    Bronchiectasis with (acute) exacerbation ([place]) 04/29/2020    Hemoptysis    Chronic respiratory failure with hypoxia ([place])    AKI (acute kidney injury) ([place])    Bronchiectasis ([place]) 09/08/2019   Atrial fibrillation, chronic ([place]) 09/08/2019   Closed right hip fracture (HCC) 09/27/2018   Intractable nausea and vomiting 04/08/2018   Atrial fibrillation with RVR ([place]) 04/08/2018   Thoracic radiculitis (Bilateral) 03/29/2018   Vasovagal episode 03/29/2018   Closed compression fracture of T10 thoracic vertebra, sequela 03/29/2018   Abnormal MRI, lumbar spine (12/15/2016) 03/21/2018   Lumbar compression fractures, sequela (L1, L2, L3, L4, and L5) 03/21/2018   Closed compression fracture of L1 lumbar vertebra, sequela 03/21/2018   Closed compression fracture of L2 lumbar vertebra, sequela 03/21/2018   Closed compression fracture of L3 lumbar vertebra, sequela 03/21/2018   Closed compression fracture of L4 lumbar vertebra, sequela 03/21/2018   Closed compression fracture of L5 lumbar vertebra, sequela 03/21/2018   Thoracic compression fracture, sequela (T5, T9, T10, T11, and T12) 03/21/2018   Closed compression fracture of T5 thoracic vertebra, sequela 03/21/2018   Closed compression fracture of T9 thoracic vertebra, sequela 03/21/2018   Close compression fracture of T11 thoracic vertebra, sequela 03/21/2018   Closed compression fracture of T12 thoracic vertebra, sequela 03/21/2018   Lumbar facet hypertrophy 03/21/2018   Grade 1  Lumbar Anterolisthesis of L3/4 and L4/5 03/21/2018   Lumbar central spinal stenosis (Multilevel), 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 claudication 03/21/2018   Chronic anticoagulation (ELIQUIS) 03/21/2018   History of pelvic fracture 03/21/2018   Chronic musculoskeletal pain 03/21/2018   Neurogenic pain 03/21/2018   Long term prescription benzodiazepine use 03/21/2018   DDD (degenerative disc disease), 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   Adult bronchiectasis ([place]) 03/01/2018   Diverticulitis 03/01/2018   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[place]) 03/01/2018   Migraines  03/01/2018   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[place]) 03/01/2018   Osteoporosis, 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   Psoriasis 03/01/2018   Chronic upper back pain (Primary Area of Pain) (Bilateral) (R>L) 03/01/2018   Chronic low back pain (Secondary Area of Pain) (Bilateral) (R>L) w/o sciatica 03/01/2018   Chronic pain syndrome 03/01/2018   Long term current use of opiate analgesic 03/01/2018   Pharmacologic therapy 03/01/2018   Disorder of skeletal system 03/01/2018   Problems influencing health status 03/01/2018   History of kyphoplasty (L1, L2, L3, T9, T11, and T12) 01/05/2018   Malnutrition of moderate degree ([place]) 08/03/2017   GERD (gastroesophageal reflux disease) 07/30/2017   Pelvic fracture ([place]) 07/30/2017   AF (paroxysmal atrial fibrillation) ([place]) 07/30/2017   Other dysphagia 09/13/2016   Unintended weight loss 09/13/2016   Essential hypertension 10/03/2013   Osteoarthritis of knees (Bilateral) 10/03/2013   Primary localized osteoarthrosis, lower leg 10/03/2013   Non-ischemic cardiomyopathy ([place]) 09/12/2013   Coronary artery disease involving native coronary artery of native heart without angina pectoris 07/28/2012   Hyperlipidemia, mixed 07/28/2012   DDD (degenerative disc disease), lumbar 02/29/2012   PCP:  [name], MD Pharmacy:   [id number] - [place], [place] - [place] AT [place] [address] [place] [address] Phone: [phone] Fax: [phone]     Social Determinants of Health (SDOH) Interventions  Readmission Risk Interventions Readmission Risk Prevention Plan 07/17/2020 01/06/2018  Transportation Screening Complete Complete  PCP or Specialist Appt within 5-7 Days - Complete  PCP or Specialist Appt within 3-5 Days Complete -  Home Care Screening - Complete  Medication Review (RN CM) - Complete  HRI or Home Care Consult Complete -  Social Work Consult for [place] Planning/Counseling Complete -  Palliative Care Screening Not Applicable -  Medication Review Press photographer) Complete -  Some recent data might be hidden

## 2020-07-18 DIAGNOSIS — N390 Urinary tract infection, site not specified: Secondary | ICD-10-CM | POA: Diagnosis not present

## 2020-07-18 DIAGNOSIS — J471 Bronchiectasis with (acute) exacerbation: Secondary | ICD-10-CM | POA: Diagnosis not present

## 2020-07-18 DIAGNOSIS — A310 Pulmonary mycobacterial infection: Secondary | ICD-10-CM | POA: Diagnosis not present

## 2020-07-18 LAB — CBC
HCT: 35.8 % — ABNORMAL LOW (ref 36.0–46.0)
Hemoglobin: 12.2 g/dL (ref 12.0–15.0)
MCH: 32.5 pg (ref 26.0–34.0)
MCHC: 34.1 g/dL (ref 30.0–36.0)
MCV: 95.5 fL (ref 80.0–100.0)
Platelets: 313 10*3/uL (ref 150–400)
RBC: 3.75 MIL/uL — ABNORMAL LOW (ref 3.87–5.11)
RDW: 11.9 % (ref 11.5–15.5)
WBC: 11.8 10*3/uL — ABNORMAL HIGH (ref 4.0–10.5)
nRBC: 0 % (ref 0.0–0.2)

## 2020-07-18 LAB — BASIC METABOLIC PANEL
Anion gap: 3 — ABNORMAL LOW (ref 5–15)
BUN: 17 mg/dL (ref 8–23)
CO2: 36 mmol/L — ABNORMAL HIGH (ref 22–32)
Calcium: 8.4 mg/dL — ABNORMAL LOW (ref 8.9–10.3)
Chloride: 92 mmol/L — ABNORMAL LOW (ref 98–111)
Creatinine, Ser: 0.38 mg/dL — ABNORMAL LOW (ref 0.44–1.00)
GFR, Estimated: >60 mL/min (ref >60)
Glucose, Bld: 126 mg/dL — ABNORMAL HIGH (ref 70–99)
Potassium: 4.1 mmol/L (ref 3.5–5.1)
Sodium: 131 mmol/L — ABNORMAL LOW (ref 135–145)

## 2020-07-18 LAB — FERRITIN: Ferritin: 208 ng/mL (ref 11–307)

## 2020-07-18 LAB — C-REACTIVE PROTEIN: CRP: 2 mg/dL — ABNORMAL HIGH (ref <1.0)

## 2020-07-18 NOTE — Plan of Care (Signed)
  Problem: Clinical Measurements: Goal: Ability to maintain clinical measurements within normal limits will improve Outcome: Progressing Goal: Will remain free from infection Outcome: Progressing Goal: Diagnostic test results will improve Outcome: Progressing Goal: Respiratory complications will improve Outcome: Progressing Goal: Cardiovascular complication will be avoided Outcome: Progressing   Problem: Pain Managment: Goal: General experience of comfort will improve Outcome: Progressing   Pt is involved in and agrees with the plan of care. V/S stable except HR in 50s. On oxygen at 2lpm/ with oxygen saturation at 96%. Dyspnea on exertion noted. No complaints of pain at this time.

## 2020-07-18 NOTE — Progress Notes (Addendum)
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 Continue on IV merrem, IV steroids, bronchodilators and encourage incentive spirometry. Pulmon following and recs apprec   COVID19 infection: completed remdesivir course. Continue on steroids and bronchodilators. Encourage incentive spirometry   UTI: urine cx growing ESBL e. coli. Continue on IV merrem  Possibly dysphagia: continue on dysphagia III diet as per speech   Oral thrush:continue on nystatin   Hyponatremia: stable. Will continue to monitor   Lactic acidosis: resolved  Sepsis: met criteria w/ tachycardia, luekocytosis, fever & COVID19 infection & UTI. Resolved   HTN: continue on metoprolol, diltiazem  PAF: continue on diltiazem, eliquis & metoprolol   Hx of CAD: w/ minimally elevated troponins likely secondary to demand ischemia  MAC:  continue on IV merrem, azithromycin. Pulmon following and recs apprec   GERD: continue on pantoprazole    DVT prophylaxis: eliquis  Code Status: DNR  Family Communication:  Disposition Plan: likely d/c back home w/ home health   Level of care: Med-Surg  Status is: Inpatient  Remains inpatient appropriate because:Unsafe d/c plan, IV treatments appropriate due to  intensity of illness or inability to take PO, and Inpatient level of care appropriate due to severity of illness  Dispo: The patient is from: Home              Anticipated d/c is to: Home vs SNF               Patient currently is not medically stable to d/c.   Difficult to place patient: unclear       Consultants:  Pulmon   Procedures:   Antimicrobials: merrem, azithromycin     Subjective: Pt c/o improved burning w/ urination today   Objective: Vitals:   07/17/20 1535 07/17/20 2108 07/18/20 0120 07/18/20 0445  BP: (!) 148/85 139/83 (!) 147/87 (!) 156/79  Pulse: 68 66 (!) 58 (!) 54  Resp: _0 Temp: 98.2 F (36.8 C) 98.2 F (36.8 C) 98.1 F (36.7 C) 98.1 F (36.7 C)  TempSrc:      SpO2: 98% 97% 96% 98%  Weight:      Height:        Intake/Output Summary (Last 24 hours) at 07/18/2020 0744 Last data filed at 07/18/2020 0500 Gross per 24 hour  Intake 730 ml  Output 1900 ml  Net -1170 ml   Filed Weights   07/14/20 1113 07/15/20 1532  Weight: 49.9 kg 52.3 kg    Examination:  General exam: Appears calm and comfortable Respiratory system: decreased breath sounds b/l.  Cardiovascular system: S1 & S2+. No gallops or rubs  Gastrointestinal system: Abd is soft, NT, ND & hypoactive bowel sounds  Central nervous system: Alert and oriented. Moves all extremities  Psychiatry: judgement and insight appear normal. Flat mood and affect    Data Reviewed:  I have personally reviewed following labs and imaging studies  CBC: Recent Labs  Lab 07/14/20 1118 07/15/20 0638 07/16/20 0530 07/17/20 0511 07/18/20 0419  WBC 16.3* 11.7* 10.1 10.5 11.8*  NEUTROABS  --  10.7*  --   --   --   HGB 14.2 12.3 11.8* 12.7 12.2  HCT 40.9 35.0* 33.4* 36.5 35.8*  MCV 96.2 93.1 94.6 95.3 95.5  PLT 349 268 283 315 536   Basic Metabolic Panel: Recent Labs  Lab 07/14/20 1118 07/15/20 0638 07/16/20 0530 07/17/20 0511 07/18/20 0419  NA 129* 128* 129* 131* 131*  K 4.1 3.5  4.1 4.1 4.1  CL 83* 85* 89* 89* 92*  CO2 37* 34* 37* 36* 36*  GLUCOSE 87 112* 150* 136* 126*  BUN _0 CREATININE 0.58 0.48 0.47 0.48 0.38*  CALCIUM 9.3 7.7* 8.3* 8.5* 8.4*  MG  --  1.7  --   --   --    GFR: Estimated Creatinine Clearance: 50.2 mL/min (A) (by C-G formula based on SCr of 0.38 mg/dL (L)). Liver Function Tests: Recent Labs  Lab 07/14/20 1118 07/15/20 0638  AST 30 27  ALT 20 17  ALKPHOS 50 36*  BILITOT 0.7 0.6  PROT 6.9 5.2*  ALBUMIN 3.9 2.5*   No results for input(s): LIPASE, AMYLASE in the last 168 hours. No results for input(s): AMMONIA in the last 168 hours. Coagulation Profile: Recent Labs  Lab 07/14/20 1158  INR 1.0   Cardiac Enzymes: No results for input(s): CKTOTAL, CKMB, CKMBINDEX, TROPONINI in the last 168 hours. BNP (last 3 results) No results for input(s): PROBNP in the last 8760 hours. HbA1C: No results for input(s): HGBA1C in the last 72 hours. CBG: No results for input(s): GLUCAP in the last 168 hours. Lipid Profile: No results for input(s): CHOL, HDL, LDLCALC, TRIG, CHOLHDL, LDLDIRECT in the last 72 hours. Thyroid Function Tests: No results for input(s): TSH, T4TOTAL, FREET4, T3FREE, THYROIDAB in the last 72 hours. Anemia Panel: Recent Labs    07/17/20 0511 07/18/20 0419  FERRITIN 256 208   Sepsis Labs: Recent Labs  Lab 07/14/20 1158 07/14/20 1335  PROCALCITON <0.10  --   LATICACIDVEN 2.5* 1.7    Recent Results (from the past 240 hour(s))  Blood culture (routine x 2)     Status: None (Preliminary result)   Collection Time: 07/14/20 11:58 AM   Specimen: BLOOD  Result Value Ref Range Status   Specimen Description BLOOD LEFT ANTECUBITAL  Final   Special Requests   Final    BOTTLES DRAWN AEROBIC AND ANAEROBIC Blood Culture adequate volume   Culture   Final    NO GROWTH 4 DAYS Performed at [facility], [address] [place], [place] [address]    Report Status PENDING  Incomplete  Blood culture  (routine x 2)     Status: None (Preliminary result)   Collection Time: 07/14/20 12:17 PM   Specimen: BLOOD  Result Value Ref Range Status   Specimen Description BLOOD BLOOD LEFT ARM  Final   Special Requests   Final    BOTTLES DRAWN AEROBIC AND ANAEROBIC Blood Culture adequate volume   Culture   Final    NO GROWTH 4 DAYS Performed at [facility], [address] [place], [place] [address]    Report Status PENDING  Incomplete  Resp Panel by RT-PCR (Flu A&B, Covid) Urine, Clean Catch     Status: Abnormal   Collection Time: 07/14/20  2:03 PM  Specimen: Urine, Clean Catch; Nasopharyngeal(NP) swabs in vial transport medium  Result Value Ref Range Status   SARS Coronavirus 2 by RT PCR POSITIVE (A) NEGATIVE Final    Comment: RESULT CALLED TO, READ BACK BY AND VERIFIED WITH: ZACK REGISTER _0  07/14/20 MJU (NOTE) SARS-CoV-2 target nucleic acids are DETECTED.  The SARS-CoV-2 RNA is generally detectable in upper respiratory specimens during the acute phase of infection. Positive results are indicative of the presence of the identified virus, but do not rule out bacterial infection or co-infection with other pathogens not detected by the test. Clinical correlation with patient history and other diagnostic information is necessary to determine patient infection status. The expected result is Negative.  Fact Sheet for Patients: EntrepreneurPulse.com.au  Fact Sheet for Healthcare Providers: IncredibleEmployment.be  This test is not yet approved or cleared by the [place] FDA and  has been authorized for detection and/or diagnosis of SARS-CoV-2 by FDA under an Emergency Use Authorization (EUA).  This EUA will remain in effect (meaning this test can be u sed) for the duration of  the COVID-19 declaration under Section 564(b)(1) of the Act, 21 U.S.C. section 360bbb-3(b)(1), unless the authorization is terminated or revoked sooner.      Influenza A by PCR NEGATIVE NEGATIVE Final   Influenza B by PCR NEGATIVE NEGATIVE Final    Comment: (NOTE) The Xpert Xpress SARS-CoV-2/FLU/RSV plus assay is intended as an aid in the diagnosis of influenza from Nasopharyngeal swab specimens and should not be used as a sole basis for treatment. Nasal washings and aspirates are unacceptable for Xpert Xpress SARS-CoV-2/FLU/RSV testing.  Fact Sheet for Patients: EntrepreneurPulse.com.au  Fact Sheet for Healthcare Providers: IncredibleEmployment.be  This test is not yet approved or cleared by the [place] FDA and has been authorized for detection and/or diagnosis of SARS-CoV-2 by FDA under an Emergency Use Authorization (EUA). This EUA will remain in effect (meaning this test can be used) for the duration of the COVID-19 declaration under Section 564(b)(1) of the Act, 21 U.S.C. section 360bbb-3(b)(1), unless the authorization is terminated or revoked.  Performed at [facility], [address], [place], [place] [address]   Urine culture     Status: Abnormal   Collection Time: 07/14/20  2:06 PM   Specimen: In/Out Cath Urine  Result Value Ref Range Status   Specimen Description   Final    IN/OUT CATH URINE Performed at [facility], [address] [place], [place] [address]    Special Requests   Final    NONE Performed at [facility], [address] [place], [place] [address]    Culture (A)  Final    >=100,000 COLONIES/mL ESCHERICHIA COLI Confirmed Extended Spectrum Beta-Lactamase Producer (ESBL).  In bloodstream infections from ESBL organisms, carbapenems are preferred over piperacillin/tazobactam. They are shown to have a lower risk of mortality.    Report Status 07/17/2020 FINAL  Final   Organism ID, Bacteria ESCHERICHIA COLI (A)  Final      Susceptibility   Escherichia coli - MIC*    AMPICILLIN >=32 RESISTANT Resistant     CEFAZOLIN >=64 RESISTANT  Resistant     CEFEPIME <=0.12 SENSITIVE Sensitive     CEFTRIAXONE >=64 RESISTANT Resistant     CIPROFLOXACIN >=4 RESISTANT Resistant     GENTAMICIN 8 INTERMEDIATE Intermediate     IMIPENEM <=0.25 SENSITIVE Sensitive     NITROFURANTOIN <=16 SENSITIVE Sensitive     TRIMETH/SULFA >=320 RESISTANT Resistant     AMPICILLIN/SULBACTAM >=32 RESISTANT Resistant  PIP/TAZO 16 SENSITIVE Sensitive     * >=100,000 COLONIES/mL ESCHERICHIA COLI         Radiology Studies: No results found.      Scheduled Meds:  apixaban  5 mg Oral BID   vitamin C  500 mg Oral Daily   bisacodyl  10 mg Rectal Once   dexamethasone (DECADRON) injection  4 mg Intravenous Q24H   digoxin  125 mcg Oral Daily   diltiazem  180 mg Oral Daily   ferrous sulfate  325 mg Oral Daily   gabapentin  300 mg Oral QID   glycopyrrolate  1 mg Oral TID   levalbuterol  2 puff Inhalation Q6H   LORazepam  0.5 mg Oral QHS   losartan  100 mg Oral Daily   metoprolol succinate  100 mg Oral Daily   multivitamin with minerals  1 tablet Oral Daily   nystatin  5 mL Oral QID   pantoprazole  40 mg Oral Daily   senna  2 tablet Oral QHS   zinc sulfate  220 mg Oral Daily   Continuous Infusions:  azithromycin 500 mg (07/17/20 1527)   meropenem (MERREM) IV Stopped (07/18/20 0146)   remdesivir 100 mg in NS 100 mL 100 mg (07/17/20 0955)     LOS: 3 days    Time spent: 30 mins     Wyvonnia Dusky, MD [facility] Pager [phone]  If 7PM-7AM, please contact night-coverage  07/18/2020, 7:44 AM

## 2020-07-18 NOTE — Progress Notes (Signed)
Pulmonary and Critical Care Medicine          Date: 07/18/2020,   MRN# [id number] Ana Washington 16-Dec-1945     AdmissionWeight: 49.9 kg                 CurrentWeight: 52.3 kg (bed weight)   Referring physician: Sherrie George FNP   CHIEF COMPLAINT:   Acute exacerbation of bronchiectasis due to [place]   This is a very pleasant female with hx of permanent AF, severe bronchiectasis chronically with chronic hypoxemia.  She uses Tobramycin nebulized therapy daily and has been on cipro po in the past for >20 years.  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  She is generally on 3L/min nasal canula and is slowly ambulatory.  She had mechanical fall last year and broke her foot which further hindered her functional status but is not demented.    07/15/20- Patient is improved, she is smiling during my interview.  She still is very ronchorous bilaterally but overall interval improvement. Plan to continue current care plan with estimated DC post [place] IV therapy completion.   07/16/20- Patient has oral thrush with sore throat and ecoli UTI.  I discussed her blood work and findings. Weaning to 2L/min.  She walked to door and back twice today  07/17/20- patient reports burning and cold chills while infusion of maxipime is running. Will dc this today and prescribe levoquin  07/18/20- patient on 2L/min , she is sitting up in chair.  She is on merem and vecluri.  She is feeling ok but expectorates green phlegm.  PAST MEDICAL HISTORY   Past Medical History:  Diagnosis Date  . Arthritis   . Atrial fibrillation ([place])   . Bronchiectasis ([place])   . CAD (coronary artery disease) 07/30/2017  . Dumping syndrome   . 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 Family history of adverse reaction to anesthesia    sister [name]  . GERD (gastroesophageal reflux disease)   . Headache     MIGRAINES  . Myocardial infarction ([place]) 2007   Non-STEMI  . [name] (postoperative nausea and vomiting)   . Psoriasis   . PUD (peptic ulcer disease)      SURGICAL HISTORY   Past Surgical History:  Procedure Laterality Date  . BACK SURGERY    . CHOLECYSTECTOMY    . ESOPHAGOGASTRODUODENOSCOPY (EGD) WITH PROPOFOL N/A 03/19/2019   Procedure: ESOPHAGOGASTRODUODENOSCOPY (EGD) WITH PROPOFOL;  Surgeon: [name], MD;  Location: [facility];  Service: Gastroenterology;  Laterality: N/A;  *Note to anesthesia: Per pt's pulmonologist, if intubating, please extubate to BIPAP.  [name] EYE SURGERY    . FOOT SURGERY    . INTRAMEDULLARY (IM) NAIL INTERTROCHANTERIC Right 09/30/2018   Procedure: INTRAMEDULLARY (IM) NAIL INTERTROCHANTRIC;  Surgeon: Dereck Leep, MD;  Location: [facility] ORS;  Service: Orthopedics;  Laterality: Right;  . KYPHOPLASTY N/A 07/05/2016   Procedure: KYPHOPLASTY T - 9;  Surgeon: Hessie Knows, MD;  Location: [facility] ORS;  Service: Orthopedics;  Laterality: N/A;  . KYPHOPLASTY N/A 11/29/2017   Procedure: Iona Hansen;  Surgeon: Hessie Knows, MD;  Location: [facility] ORS;  Service: Orthopedics;  Laterality: N/A;  L2 and L3  . KYPHOPLASTY N/A 12/18/2017   Procedure: KYPHOPLASTY L1;  Surgeon: Hessie Knows, MD;  Location: [facility] ORS;  Service: Orthopedics;  Laterality: N/A;  . KYPHOPLASTY N/A 01/05/2018   Procedure: KYPHOPLASTY-T11,T12;  Surgeon: Hessie Knows, MD;  Location: [facility]  ORS;  Service: Orthopedics;  Laterality: N/A;  . KYPHOPLASTY N/A 04/05/2018   Procedure: T10 KYPHOPLASTY;  Surgeon: Hessie Knows, MD;  Location: [facility] ORS;  Service: Orthopedics;  Laterality: N/A;  . KYPHOPLASTY N/A 04/12/2018   Procedure: KYPHOPLASTY T7,8;  Surgeon: Hessie Knows, MD;  Location: [facility] ORS;  Service: Orthopedics;  Laterality: N/A;  . KYPHOPLASTY N/A 04/19/2018   Procedure: KYPHOPLASTY T5, T6;  Surgeon: Hessie Knows, MD;  Location: [facility] ORS;  Service: Orthopedics;  Laterality: N/A;  . LUNG SURGERY   1990 and 1996  . THOROCOTOMY WITH LOBECTOMY     LEFT LOWER THORACOTOMY / RIGHT MIDDLE LOBECTOMY     FAMILY HISTORY   Family History  Problem Relation Age of Onset  . Hypertension Mother   . Hypertension Father      SOCIAL HISTORY   Social History   Tobacco Use  . Smoking status: Never  . Smokeless tobacco: Never  Vaping Use  . Vaping Use: Never used  Substance Use Topics  . Alcohol use: No  . Drug use: No     MEDICATIONS    Home Medication:  REM   Current Medication:  Current Facility-Administered Medications:  .  acetaminophen (TYLENOL) tablet 650 mg, 650 mg, Oral, Q6H PRN, 650 mg at 07/17/20 2202 **OR** acetaminophen (TYLENOL) suppository 650 mg, 650 mg, Rectal, Q6H PRN, Shalhoub, Sherryll Burger, MD .  apixaban (ELIQUIS) tablet 5 mg, 5 mg, Oral, BID, Shalhoub, Sherryll Burger, MD, 5 mg at 07/18/20 1058 .  ascorbic acid (VITAMIN C) tablet 500 mg, 500 mg, Oral, Daily, Shalhoub, Sherryll Burger, MD, 500 mg at 07/18/20 1058 .  azithromycin (ZITHROMAX) 500 mg in sodium chloride 0.9 % 250 mL IVPB, 500 mg, Intravenous, Q24H, Shalhoub, Sherryll Burger, MD, Last Rate: 250 mL/hr at 07/17/20 1527, 500 mg at 07/17/20 1527 .  benzonatate (TESSALON) capsule 200 mg, 200 mg, Oral, TID PRN, Shalhoub, Sherryll Burger, MD .  bisacodyl (DULCOLAX) suppository 10 mg, 10 mg, Rectal, Once, Ottie Glazier, MD .  calcium carbonate (TUMS - dosed in mg elemental calcium) chewable tablet 200 mg of elemental calcium, 1 tablet, Oral, TID PRN, Wyvonnia Dusky, MD, 200 mg of elemental calcium at 07/15/20 1251 .  dexamethasone (DECADRON) injection 4 mg, 4 mg, Intravenous, Q24H, Lanney Gins, Brendalyn Vallely, MD, 4 mg at 07/17/20 1733 .  digoxin (LANOXIN) tablet 125 mcg, 125 mcg, Oral, Daily, Shalhoub, Sherryll Burger, MD, 125 mcg at 07/18/20 1058 .  diltiazem (CARDIZEM CD) 24 hr capsule 180 mg, 180 mg, Oral, Daily, Dorothe Pea, RPH, 180 mg at 07/18/20 1059 .  ferrous sulfate tablet 325 mg, 325 mg, Oral, Daily, Shalhoub, Sherryll Burger, MD, 325 mg at  07/18/20 1058 .  gabapentin (NEURONTIN) capsule 300 mg, 300 mg, Oral, QID, Judd Gaudier V, MD, 300 mg at 07/18/20 1058 .  glycopyrrolate (ROBINUL) tablet 1 mg, 1 mg, Oral, TID, Shalhoub, Sherryll Burger, MD, 1 mg at 07/18/20 1058 .  levalbuterol (XOPENEX HFA) inhaler 2 puff, 2 puff, Inhalation, Q6H, Shalhoub, Sherryll Burger, MD, 2 puff at 07/18/20 1104 .  levalbuterol (XOPENEX HFA) inhaler 2 puff, 2 puff, Inhalation, Q2H PRN, Shalhoub, Sherryll Burger, MD, 2 puff at 07/15/20 1252 .  LORazepam (ATIVAN) tablet 0.5 mg, 0.5 mg, Oral, QHS, Shalhoub, Sherryll Burger, MD, 0.5 mg at 07/17/20 2202 .  losartan (COZAAR) tablet 100 mg, 100 mg, Oral, Daily, Shalhoub, Sherryll Burger, MD, 100 mg at 07/18/20 1058 .  meropenem (MERREM) 1 g in sodium chloride 0.9 % 100 mL IVPB, 1 g, Intravenous, Q8H, Alassane Kalafut,  Gerrod Maule, MD, Last Rate: 200 mL/hr at 07/18/20 1117, 1 g at 07/18/20 1117 .  metoprolol succinate (TOPROL-XL) 24 hr tablet 100 mg, 100 mg, Oral, Daily, Shalhoub, Sherryll Burger, MD, 100 mg at 07/18/20 1058 .  multivitamin with minerals tablet 1 tablet, 1 tablet, Oral, Daily, Wyvonnia Dusky, MD, 1 tablet at 07/18/20 1058 .  nystatin (MYCOSTATIN) 100000 UNIT/ML suspension 500,000 Units, 5 mL, Oral, QID, Verlyn Dannenberg, MD, 500,000 Units at 07/18/20 1059 .  ondansetron (ZOFRAN) tablet 4 mg, 4 mg, Oral, Q6H PRN **OR** ondansetron (ZOFRAN) injection 4 mg, 4 mg, Intravenous, Q6H PRN, Shalhoub, Sherryll Burger, MD .  pantoprazole (PROTONIX) EC tablet 40 mg, 40 mg, Oral, Daily, Shalhoub, Sherryll Burger, MD, 40 mg at 07/18/20 1058 .  polyethylene glycol (MIRALAX / GLYCOLAX) packet 17 g, 17 g, Oral, Daily PRN, Shalhoub, Sherryll Burger, MD, 17 g at 07/18/20 1056 .  [COMPLETED] remdesivir 200 mg in sodium chloride 0.9% 250 mL IVPB, 200 mg, Intravenous, Once, Stopped at 07/14/20 1801 **FOLLOWED BY** remdesivir 100 mg in sodium chloride 0.9 % 100 mL IVPB, 100 mg, Intravenous, Daily, Shalhoub, Sherryll Burger, MD, Last Rate: 200 mL/hr at 07/17/20 0955, 100 mg at 07/17/20 0955 .  senna  (SENOKOT) tablet 17.2 mg, 2 tablet, Oral, QHS, Shalhoub, Sherryll Burger, MD, 17.2 mg at 07/17/20 2201 .  zinc sulfate capsule 220 mg, 220 mg, Oral, Daily, Shalhoub, Sherryll Burger, MD, 220 mg at 07/18/20 1058    ALLERGIES   Codeine, Sulfa antibiotics, and Penicillins     REVIEW OF SYSTEMS    Review of Systems:  Gen:  Denies  fever, sweats, chills weigh loss  HEENT: Denies blurred vision, double vision, ear pain, eye pain, hearing loss, nose bleeds, sore throat Cardiac:  No dizziness, chest pain or heaviness, chest tightness,edema Resp:   Denies cough or sputum porduction, shortness of breath,wheezing, hemoptysis,  Gi: Denies swallowing difficulty, stomach pain, nausea or vomiting, diarrhea, constipation, bowel incontinence Gu:  Denies bladder incontinence, burning urine Ext:   Denies Joint pain, stiffness or swelling Skin: Denies  skin rash, easy bruising or bleeding or hives Endoc:  Denies polyuria, polydipsia , polyphagia or weight change Psych:   Denies depression, insomnia or hallucinations   Other:  All other systems negative   VS: BP (!) 146/94 (BP Location: Right Arm)   Pulse 71   Temp 97.9 F (36.6 C) (Oral)   Resp 18   Ht '5\' 5"'$  (1.651 m)   Wt 52.3 kg Comment: bed weight  SpO2 100%   BMI 19.19 kg/m      PHYSICAL EXAM    GENERAL:NAD, no fevers, chills, no weakness no fatigue HEAD: Normocephalic, atraumatic.  EYES: Pupils equal, round, reactive to light. Extraocular muscles intact. No scleral icterus.  MOUTH: Moist mucosal membrane. Dentition intact. No abscess noted.  EAR, NOSE, THROAT: Clear without exudates. No external lesions.  NECK: Supple. No thyromegaly. No nodules. No JVD.  PULMONARY: rhonchi bilaterally  CARDIOVASCULAR: S1 and S2. Regular rate and rhythm. No murmurs, rubs, or gallops. No edema. Pedal pulses 2+ bilaterally.  GASTROINTESTINAL: Soft, nontender, nondistended. No masses. Positive bowel sounds. No hepatosplenomegaly.  MUSCULOSKELETAL: No  swelling, clubbing, or edema. Range of motion full in all extremities.  NEUROLOGIC: Cranial nerves II through XII are intact. No gross focal neurological deficits. Sensation intact. Reflexes intact.  SKIN: No ulceration, lesions, rashes, or cyanosis. Skin warm and dry. Turgor intact.  PSYCHIATRIC: Mood, affect within normal limits. The patient is awake, alert and oriented x 3. Insight, judgment  intact.       IMAGING    DG Chest 2 View  Result Date: 07/14/2020 CLINICAL DATA:  74 year old female with shortness of breath and cough. EXAM: CHEST - 2 VIEW COMPARISON:  Chest CT 04/29/2020 and earlier. FINDINGS: Seated AP and lateral views of the chest. Large chronic left diaphragmatic hernia appears stable. Stable cardiac size and mediastinal contours. Bronchiectasis better demonstrated by CT. Chronic staple line in the right upper lobe. Stable coarse bilateral pulmonary interstitial opacity. No pneumothorax, pulmonary edema, or acute pulmonary opacity. Diffuse spinal compression fractures with at least 10 levels previously augmented. Stable visualized osseous structures. Negative visible bowel gas pattern. IMPRESSION: Chronic lung disease with bronchiectasis. Chronic left diaphragmatic hernia. No acute cardiopulmonary abnormality identified. Electronically Signed   By: Genevie Ann M.D.   On: 07/14/2020 11:54   CT Angio Chest PE W and/or Wo Contrast  Result Date: 07/14/2020 CLINICAL DATA:  PE suspected, high prob EXAM: CT ANGIOGRAPHY CHEST WITH CONTRAST TECHNIQUE: Multidetector CT imaging of the chest was performed using the standard protocol during bolus administration of intravenous contrast. Multiplanar CT image reconstructions and MIPs were obtained to evaluate the vascular anatomy. CONTRAST:  55m OMNIPAQUE IOHEXOL 350 MG/ML SOLN COMPARISON:  Chest CT 04/29/2020 FINDINGS: Cardiovascular: Satisfactory opacification of the pulmonary arteries to the segmental level. No evidence of pulmonary embolism.  Unchanged enlarged central pulmonary arteries compatible with pulmonary hypertension. Unchanged mild cardiomegaly. Atherosclerotic calcifications of the aorta. Coronary artery calcifications. Mediastinum/Nodes: There is no mediastinal, hilar, or axillary lymphadenopathy. Thyroid is unremarkable. There is a large hiatal hernia containing the a hepatic flexure of the colon and the stomach, similar to prior exam. Lungs/Pleura: Diffuse bronchial wall thickening with multifocal bronchiectasis, most severe in the right upper lobe and lung bases bilaterally. Multifocal mucous plugging. Similar architectural distortion in the of the left lower lobe. Prior resections of the lateral segment of the right upper lobe, the right middle lobe, and inferior lingula. There is small right-sided subpleural nodularity, increased from prior exam, largest measuring 2.0 x 0.6 cm, and another measuring 0.9 x 0.8 cm (series 7, images 53 and 41 respectively). Similar subpleural nodularity in the left upper lobe. Scattered lung scarring and tree-in-bud nodularity. Mosaic attenuation bilaterally, likely due to air trapping. Upper Abdomen: Unchanged left renal cyst. Prior cholecystectomy. No acute abnormality. Musculoskeletal: Unchanged multiple thoracolumbar compression deformities with vertebroplasties. Unchanged chronic T3 compression deformity. Partially visualized severe degenerative changes in the lower cervical spine. Review of the MIP images confirms the above findings. IMPRESSION: No evidence of pulmonary embolism. Unchanged enlarged pulmonary arteries suggestive of pulmonary hypertension. 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 Increased subpleural nodularity on the right, which may represent progression of disease. Chronic mosaic attenuation of the lungs likely due to air trapping. Recommend follow-up chest CT  in 3 months and follow-up with pulmonology. Aortic Atherosclerosis (ICD10-I70.0). Electronically Signed   By: JMaurine Simmering  On: 07/14/2020 13:33      ASSESSMENT/PLAN    Acute exacerbation of bronchiectasis  - empirically on zithromax and cefepime for possible sepsis    - agree with current antibiotics    Acute COVID19 pneumonia -Remdesevir antiviral - pharmacy protocol 5 d -vitamin C -zinc -decadron '6mg'$  IV daily  -- monitor UOP - utilize external urinary catheter if possible -encourage to use IS and Acapella device for bronchopulmonary hygiene when able -d/c hepatotoxic medications while on remdesevir -supportive care with ICU telemetry monitoring -PT/OT when possible -procalcitonin,  CRP and ferritin trending -pt s/p Paxlovid therapy     Oral thrush -nystatin    Ecoli UTI  - patient rports adverse effect to cefepime, she was on levoquin prior with good efficacy for bronchiectasis without side effects and this abx will work for uti as well.    Chronic AF    - continue eliquis - no hemoptysis Digoxin    - currently rate controlled with BB and CchB       Thank you for allowing me to participate in the care of this patient.   Patient/Family are satisfied with care plan and all questions have been answered.   This document was prepared using Dragon voice recognition software and may include unintentional dictation errors.     Ottie Glazier, M.D.  Division of [place]

## 2020-07-19 DIAGNOSIS — A310 Pulmonary mycobacterial infection: Secondary | ICD-10-CM | POA: Diagnosis not present

## 2020-07-19 DIAGNOSIS — J471 Bronchiectasis with (acute) exacerbation: Secondary | ICD-10-CM | POA: Diagnosis not present

## 2020-07-19 DIAGNOSIS — N390 Urinary tract infection, site not specified: Secondary | ICD-10-CM | POA: Diagnosis not present

## 2020-07-19 LAB — CULTURE, BLOOD (ROUTINE X 2)
Culture: NO GROWTH
Culture: NO GROWTH
Special Requests: ADEQUATE
Special Requests: ADEQUATE

## 2020-07-19 LAB — BASIC METABOLIC PANEL
Anion gap: 4 — ABNORMAL LOW (ref 5–15)
BUN: 18 mg/dL (ref 8–23)
CO2: 36 mmol/L — ABNORMAL HIGH (ref 22–32)
Calcium: 8.2 mg/dL — ABNORMAL LOW (ref 8.9–10.3)
Chloride: 90 mmol/L — ABNORMAL LOW (ref 98–111)
Creatinine, Ser: 0.41 mg/dL — ABNORMAL LOW (ref 0.44–1.00)
GFR, Estimated: >60 mL/min (ref >60)
Glucose, Bld: 135 mg/dL — ABNORMAL HIGH (ref 70–99)
Potassium: 4.4 mmol/L (ref 3.5–5.1)
Sodium: 130 mmol/L — ABNORMAL LOW (ref 135–145)

## 2020-07-19 LAB — CBC
HCT: 36.4 % (ref 36.0–46.0)
Hemoglobin: 13 g/dL (ref 12.0–15.0)
MCH: 33.6 pg (ref 26.0–34.0)
MCHC: 35.7 g/dL (ref 30.0–36.0)
MCV: 94.1 fL (ref 80.0–100.0)
Platelets: 332 10*3/uL (ref 150–400)
RBC: 3.87 MIL/uL (ref 3.87–5.11)
RDW: 11.8 % (ref 11.5–15.5)
WBC: 16.1 10*3/uL — ABNORMAL HIGH (ref 4.0–10.5)
nRBC: 0 % (ref 0.0–0.2)

## 2020-07-19 LAB — FERRITIN: Ferritin: 246 ng/mL (ref 11–307)

## 2020-07-19 LAB — C-REACTIVE PROTEIN: CRP: 1.1 mg/dL — ABNORMAL HIGH (ref <1.0)

## 2020-07-19 MED ORDER — TRAZODONE HCL 100 MG PO TABS
100.0000 mg | ORAL_TABLET | Freq: Every day | ORAL | Status: DC
  Administered 2020-07-19: 100 mg via ORAL
  Filled 2020-07-19: qty 1

## 2020-07-19 MED ORDER — DEXAMETHASONE SODIUM PHOSPHATE 10 MG/ML IJ SOLN
2.0000 mg | INTRAMUSCULAR | Status: DC
  Administered 2020-07-19: 2 mg via INTRAVENOUS
  Filled 2020-07-19: qty 1

## 2020-07-19 NOTE — Progress Notes (Signed)
Pulmonary and Critical Care Medicine          Date: 07/19/2020,   MRN# [id number] Ana Washington April 02, 1945     AdmissionWeight: 49.9 kg                 CurrentWeight: 52.3 kg (bed weight)   Referring physician: Sherrie George FNP   CHIEF COMPLAINT:   Acute exacerbation of bronchiectasis due to [place]   This is a very pleasant female with hx of permanent AF, severe bronchiectasis chronically with chronic hypoxemia.  She uses Tobramycin nebulized therapy daily and has been on cipro po in the past for >20 years.  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  She is generally on 3L/min nasal canula and is slowly ambulatory.  She had mechanical fall last year and broke her foot which further hindered her functional status but is not demented.    07/15/20- Patient is improved, she is smiling during my interview.  She still is very ronchorous bilaterally but overall interval improvement. Plan to continue current care plan with estimated DC post [place] IV therapy completion.   07/16/20- Patient has oral thrush with sore throat and ecoli UTI.  I discussed her blood work and findings. Weaning to 2L/min.  She walked to door and back twice today  07/17/20- patient reports burning and cold chills while infusion of maxipime is running. Will dc this today and prescribe levoquin  07/18/20- patient on 2L/min , she is sitting up in chair.  She is on merem and vecluri.  She is feeling ok but expectorates green phlegm.  07/19/20-  patient is improved at baseline now in terms of resp status.  She is optimized for dc home in am.  Would prescribe levofloxacin '750mg'$  x 5 days and prednisone '20mg'$  for 5 days also   PAST MEDICAL HISTORY   Past Medical History:  Diagnosis Date  . Arthritis   . Atrial fibrillation ([place])   . Bronchiectasis ([place])   . CAD (coronary artery disease) 07/30/2017  .  Dumping syndrome   . 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 Family history of adverse reaction to anesthesia    sister [name]  . GERD (gastroesophageal reflux disease)   . Headache    MIGRAINES  . Myocardial infarction ([place]) 2007   Non-STEMI  . [name] (postoperative nausea and vomiting)   . Psoriasis   . PUD (peptic ulcer disease)      SURGICAL HISTORY   Past Surgical History:  Procedure Laterality Date  . BACK SURGERY    . CHOLECYSTECTOMY    . ESOPHAGOGASTRODUODENOSCOPY (EGD) WITH PROPOFOL N/A 03/19/2019   Procedure: ESOPHAGOGASTRODUODENOSCOPY (EGD) WITH PROPOFOL;  Surgeon: [name], MD;  [facility] END[facility]COPY;  Service: Gastroenterology;  Laterality: N/A;  *Note to anesthesia: Per pt's pulmonologist, if intubating, please extubate to BIPAP.  [name] EYE SURGERY    . FOOT SURGERY    . INTRAMEDULLARY (IM) NAIL INTERTROCHANTERIC Right 09/30/2018   Procedure: INTRAMEDULLARY (IM) NAIL INTERTROCHANTRIC;  Surgeon: Dereck Leep, MD;  Location: [facility] ORS;  Service: Orthopedics;  Laterality: Right;  . KYPHOPLASTY N/A 07/05/2016   Procedure: KYPHOPLASTY T - 9;  Surgeon: Hessie Knows, MD;  Location: [facility] ORS;  Service: Orthopedics;  Laterality: N/A;  . KYPHOPLASTY N/A 11/29/2017   Procedure: Iona Hansen;  Surgeon: Hessie Knows, MD;  Location: [facility] ORS;  Service: Orthopedics;  Laterality: N/A;  L2 and L3  . KYPHOPLASTY  N/A 12/18/2017   Procedure: KYPHOPLASTY L1;  Surgeon: Hessie Knows, MD;  Location: [facility] ORS;  Service: Orthopedics;  Laterality: N/A;  . KYPHOPLASTY N/A 01/05/2018   Procedure: KYPHOPLASTY-T11,T12;  Surgeon: Hessie Knows, MD;  Location: [facility] ORS;  Service: Orthopedics;  Laterality: N/A;  . KYPHOPLASTY N/A 04/05/2018   Procedure: T10 KYPHOPLASTY;  Surgeon: Hessie Knows, MD;  Location: [facility] ORS;  Service: Orthopedics;  Laterality: N/A;  . KYPHOPLASTY N/A 04/12/2018   Procedure: KYPHOPLASTY T7,8;  Surgeon: Hessie Knows, MD;  Location: [facility] ORS;  Service: Orthopedics;   Laterality: N/A;  . KYPHOPLASTY N/A 04/19/2018   Procedure: KYPHOPLASTY T5, T6;  Surgeon: Hessie Knows, MD;  Location: [facility] ORS;  Service: Orthopedics;  Laterality: N/A;  . LUNG SURGERY  1990 and 1996  . THOROCOTOMY WITH LOBECTOMY     LEFT LOWER THORACOTOMY / RIGHT MIDDLE LOBECTOMY     FAMILY HISTORY   Family History  Problem Relation Age of Onset  . Hypertension Mother   . Hypertension Father      SOCIAL HISTORY   Social History   Tobacco Use  . Smoking status: Never  . Smokeless tobacco: Never  Vaping Use  . Vaping Use: Never used  Substance Use Topics  . Alcohol use: No  . Drug use: No     MEDICATIONS    Home Medication:  REM   Current Medication:  Current Facility-Administered Medications:  .  acetaminophen (TYLENOL) tablet 650 mg, 650 mg, Oral, Q6H PRN, 650 mg at 07/19/20 0439 **OR** acetaminophen (TYLENOL) suppository 650 mg, 650 mg, Rectal, Q6H PRN, Shalhoub, Sherryll Burger, MD .  apixaban (ELIQUIS) tablet 5 mg, 5 mg, Oral, BID, Shalhoub, Sherryll Burger, MD, 5 mg at 07/19/20 1029 .  ascorbic acid (VITAMIN C) tablet 500 mg, 500 mg, Oral, Daily, Shalhoub, Sherryll Burger, MD, 500 mg at 07/19/20 1029 .  azithromycin (ZITHROMAX) 500 mg in sodium chloride 0.9 % 250 mL IVPB, 500 mg, Intravenous, Q24H, Shalhoub, Sherryll Burger, MD, Last Rate: 250 mL/hr at 07/18/20 1713, 500 mg at 07/18/20 1713 .  benzonatate (TESSALON) capsule 200 mg, 200 mg, Oral, TID PRN, Shalhoub, Sherryll Burger, MD .  bisacodyl (DULCOLAX) suppository 10 mg, 10 mg, Rectal, Once, Ottie Glazier, MD .  calcium carbonate (TUMS - dosed in mg elemental calcium) chewable tablet 200 mg of elemental calcium, 1 tablet, Oral, TID PRN, Wyvonnia Dusky, MD, 200 mg of elemental calcium at 07/15/20 1251 .  dexamethasone (DECADRON) injection 4 mg, 4 mg, Intravenous, Q24H, Lanney Gins, Pax Reasoner, MD, 4 mg at 07/18/20 1709 .  digoxin (LANOXIN) tablet 125 mcg, 125 mcg, Oral, Daily, Shalhoub, Sherryll Burger, MD, 125 mcg at 07/19/20 1030 .  diltiazem  (CARDIZEM CD) 24 hr capsule 180 mg, 180 mg, Oral, Daily, Dorothe Pea, RPH, 180 mg at 07/19/20 1029 .  ferrous sulfate tablet 325 mg, 325 mg, Oral, Daily, Shalhoub, Sherryll Burger, MD, 325 mg at 07/19/20 1029 .  gabapentin (NEURONTIN) capsule 300 mg, 300 mg, Oral, QID, Judd Gaudier V, MD, 300 mg at 07/19/20 1030 .  glycopyrrolate (ROBINUL) tablet 1 mg, 1 mg, Oral, TID, Shalhoub, Sherryll Burger, MD, 1 mg at 07/19/20 1030 .  levalbuterol (XOPENEX HFA) inhaler 2 puff, 2 puff, Inhalation, Q6H, Shalhoub, Sherryll Burger, MD, 2 puff at 07/19/20 1030 .  levalbuterol (XOPENEX HFA) inhaler 2 puff, 2 puff, Inhalation, Q2H PRN, Shalhoub, Sherryll Burger, MD, 2 puff at 07/15/20 1252 .  LORazepam (ATIVAN) tablet 0.5 mg, 0.5 mg, Oral, QHS, Shalhoub, Sherryll Burger, MD, 0.5 mg at 07/18/20 2156 .  losartan (COZAAR) tablet 100 mg, 100 mg, Oral, Daily, Shalhoub, Sherryll Burger, MD, 100 mg at 07/19/20 1030 .  meropenem (MERREM) 1 g in sodium chloride 0.9 % 100 mL IVPB, 1 g, Intravenous, Q8H, Lukisha Procida, MD, Last Rate: 200 mL/hr at 07/19/20 1100, 1 g at 07/19/20 1100 .  metoprolol succinate (TOPROL-XL) 24 hr tablet 100 mg, 100 mg, Oral, Daily, Shalhoub, Sherryll Burger, MD, 100 mg at 07/19/20 1029 .  multivitamin with minerals tablet 1 tablet, 1 tablet, Oral, Daily, Wyvonnia Dusky, MD, 1 tablet at 07/19/20 1029 .  nystatin (MYCOSTATIN) 100000 UNIT/ML suspension 500,000 Units, 5 mL, Oral, QID, Ottie Glazier, MD, 500,000 Units at 07/19/20 1029 .  ondansetron (ZOFRAN) tablet 4 mg, 4 mg, Oral, Q6H PRN **OR** ondansetron (ZOFRAN) injection 4 mg, 4 mg, Intravenous, Q6H PRN, Shalhoub, Sherryll Burger, MD .  pantoprazole (PROTONIX) EC tablet 40 mg, 40 mg, Oral, Daily, Shalhoub, Sherryll Burger, MD, 40 mg at 07/19/20 1029 .  polyethylene glycol (MIRALAX / GLYCOLAX) packet 17 g, 17 g, Oral, Daily PRN, Shalhoub, Sherryll Burger, MD, 17 g at 07/18/20 1056 .  senna (SENOKOT) tablet 17.2 mg, 2 tablet, Oral, QHS, Shalhoub, Sherryll Burger, MD, 17.2 mg at 07/17/20 2201 .  zinc sulfate  capsule 220 mg, 220 mg, Oral, Daily, Shalhoub, Sherryll Burger, MD, 220 mg at 07/19/20 1030    ALLERGIES   Codeine, Sulfa antibiotics, and Penicillins     REVIEW OF SYSTEMS    Review of Systems:  Gen:  Denies  fever, sweats, chills weigh loss  HEENT: Denies blurred vision, double vision, ear pain, eye pain, hearing loss, nose bleeds, sore throat Cardiac:  No dizziness, chest pain or heaviness, chest tightness,edema Resp:   Denies cough or sputum porduction, shortness of breath,wheezing, hemoptysis,  Gi: Denies swallowing difficulty, stomach pain, nausea or vomiting, diarrhea, constipation, bowel incontinence Gu:  Denies bladder incontinence, burning urine Ext:   Denies Joint pain, stiffness or swelling Skin: Denies  skin rash, easy bruising or bleeding or hives Endoc:  Denies polyuria, polydipsia , polyphagia or weight change Psych:   Denies depression, insomnia or hallucinations   Other:  All other systems negative   VS: BP (!) 151/89 (BP Location: Left Arm)   Pulse 60   Temp (!) 97.4 F (36.3 C) (Oral)   Resp 18   Ht '5\' 5"'$  (1.651 m)   Wt 52.3 kg Comment: bed weight  SpO2 100%   BMI 19.19 kg/m      PHYSICAL EXAM    GENERAL:NAD, no fevers, chills, no weakness no fatigue HEAD: Normocephalic, atraumatic.  EYES: Pupils equal, round, reactive to light. Extraocular muscles intact. No scleral icterus.  MOUTH: Moist mucosal membrane. Dentition intact. No abscess noted.  EAR, NOSE, THROAT: Clear without exudates. No external lesions.  NECK: Supple. No thyromegaly. No nodules. No JVD.  PULMONARY: rhonchi bilaterally  CARDIOVASCULAR: S1 and S2. Regular rate and rhythm. No murmurs, rubs, or gallops. No edema. Pedal pulses 2+ bilaterally.  GASTROINTESTINAL: Soft, nontender, nondistended. No masses. Positive bowel sounds. No hepatosplenomegaly.  MUSCULOSKELETAL: No swelling, clubbing, or edema. Range of motion full in all extremities.  NEUROLOGIC: Cranial nerves II through XII are  intact. No gross focal neurological deficits. Sensation intact. Reflexes intact.  SKIN: No ulceration, lesions, rashes, or cyanosis. Skin warm and dry. Turgor intact.  PSYCHIATRIC: Mood, affect within normal limits. The patient is awake, alert and oriented x 3. Insight, judgment intact.       IMAGING    DG Chest 2 View  Result Date: 07/14/2020 CLINICAL DATA:  75 year old female with shortness of breath and cough. EXAM: CHEST - 2 VIEW COMPARISON:  Chest CT 04/29/2020 and earlier. FINDINGS: Seated AP and lateral views of the chest. Large chronic left diaphragmatic hernia appears stable. Stable cardiac size and mediastinal contours. Bronchiectasis better demonstrated by CT. Chronic staple line in the right upper lobe. Stable coarse bilateral pulmonary interstitial opacity. No pneumothorax, pulmonary edema, or acute pulmonary opacity. Diffuse spinal compression fractures with at least 10 levels previously augmented. Stable visualized osseous structures. Negative visible bowel gas pattern. IMPRESSION: Chronic lung disease with bronchiectasis. Chronic left diaphragmatic hernia. No acute cardiopulmonary abnormality identified. Electronically Signed   By: Genevie Ann M.D.   On: 07/14/2020 11:54   CT Angio Chest PE W and/or Wo Contrast  Result Date: 07/14/2020 CLINICAL DATA:  PE suspected, high prob EXAM: CT ANGIOGRAPHY CHEST WITH CONTRAST TECHNIQUE: Multidetector CT imaging of the chest was performed using the standard protocol during bolus administration of intravenous contrast. Multiplanar CT image reconstructions and MIPs were obtained to evaluate the vascular anatomy. CONTRAST:  48m OMNIPAQUE IOHEXOL 350 MG/ML SOLN COMPARISON:  Chest CT 04/29/2020 FINDINGS: Cardiovascular: Satisfactory opacification of the pulmonary arteries to the segmental level. No evidence of pulmonary embolism. Unchanged enlarged central pulmonary arteries compatible with pulmonary hypertension. Unchanged mild cardiomegaly.  Atherosclerotic calcifications of the aorta. Coronary artery calcifications. Mediastinum/Nodes: There is no mediastinal, hilar, or axillary lymphadenopathy. Thyroid is unremarkable. There is a large hiatal hernia containing the a hepatic flexure of the colon and the stomach, similar to prior exam. Lungs/Pleura: Diffuse bronchial wall thickening with multifocal bronchiectasis, most severe in the right upper lobe and lung bases bilaterally. Multifocal mucous plugging. Similar architectural distortion in the of the left lower lobe. Prior resections of the lateral segment of the right upper lobe, the right middle lobe, and inferior lingula. There is small right-sided subpleural nodularity, increased from prior exam, largest measuring 2.0 x 0.6 cm, and another measuring 0.9 x 0.8 cm (series 7, images 53 and 41 respectively). Similar subpleural nodularity in the left upper lobe. Scattered lung scarring and tree-in-bud nodularity. Mosaic attenuation bilaterally, likely due to air trapping. Upper Abdomen: Unchanged left renal cyst. Prior cholecystectomy. No acute abnormality. Musculoskeletal: Unchanged multiple thoracolumbar compression deformities with vertebroplasties. Unchanged chronic T3 compression deformity. Partially visualized severe degenerative changes in the lower cervical spine. Review of the MIP images confirms the above findings. IMPRESSION: No evidence of pulmonary embolism. Unchanged enlarged pulmonary arteries suggestive of pulmonary hypertension. 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 Increased subpleural nodularity on the right, which may represent progression of disease. Chronic mosaic attenuation of the lungs likely due to air trapping. Recommend follow-up chest CT in 3 months and follow-up with pulmonology. Aortic Atherosclerosis (ICD10-I70.0). Electronically Signed   By: JMaurine Simmering  On: 07/14/2020 13:33      ASSESSMENT/PLAN    Acute exacerbation of bronchiectasis  - empirically on zithromax and cefepime for possible sepsis    - agree with current antibiotics    Acute COVID19 pneumonia -Remdesevir antiviral - pharmacy protocol 5 d -vitamin C -zinc -decadron '6mg'$  IV daily  -- monitor UOP - utilize external urinary catheter if possible -encourage to use IS and Acapella device for bronchopulmonary hygiene when able -d/c hepatotoxic medications while on remdesevir -supportive care with ICU telemetry monitoring -PT/OT when possible -procalcitonin, CRP and ferritin trending -pt s/p Paxlovid therapy     Oral thrush -nystatin  Ecoli UTI  - patient rports adverse effect to cefepime, she was on levoquin prior with good efficacy for bronchiectasis without side effects and this abx will work for uti as well.    Chronic AF    - continue eliquis - no hemoptysis Digoxin    - currently rate controlled with BB and CchB       Thank you for allowing me to participate in the care of this patient.   Patient/Family are satisfied with care plan and all questions have been answered.   This document was prepared using Dragon voice recognition software and may include unintentional dictation errors.     Ottie Glazier, M.D.  Division of [facility]

## 2020-07-19 NOTE — Progress Notes (Signed)
PROGRESS NOTE    Ana Washington  UYQ:[id number] DOB: [birth year] DOA: 07/14/2020 PCP: [name], MD   Assessment & Plan:   Principal Problem:   Acute exacerbation of bronchiectasis 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 Continue on IV merrem, IV steroids, bronchodilators and encourage incentive spirometry. Pulmon following and recs apprec   COVID19 infection: completed remdesivir course. Start steroid taper and bronchodilators. Encourage incentive spirometry   UTI: urine cx growing ESBL e. coli. Continue on IV merrem   Possibly dysphagia: continue on dysphagia III diet as per speech   Oral thrush: continue on nystatin    Hyponatremia: labile. Will continue to monitor   Lactic acidosis: resolved  Sepsis: met criteria w/ tachycardia, luekocytosis, fever & COVID19 infection & UTI. Resolved   HTN: continue on BB, CCB   PAF: continue on eliquis, CCB, BB   Hx of CAD: w/ minimally elevated troponins likely secondary to demand ischemia  MAC: continue on IV azithromycin, merrem. Pulmon following and recs apprec  GERD: continue on PPI    DVT prophylaxis: eliquis  Code Status: DNR  Family Communication:  Disposition Plan: likely d/c back home w/ home health   Level of care: Med-Surg  Status is: Inpatient  Remains inpatient appropriate because:Unsafe d/c plan, IV treatments appropriate due to intensity of illness or inability to take  PO, and Inpatient level of care appropriate due to severity of illness  Dispo: The patient is from: Home              Anticipated d/c is to: Home               Patient currently is not medically stable to d/c.   Difficult to place patient: unclear       Consultants:  Pulmon   Procedures:   Antimicrobials: merrem, azithromycin     Subjective: Pt c/o shortness of breath   Objective: Vitals:   07/18/20 0445 07/18/20 1000 07/18/20 2202 07/19/20 0412  BP: (!) 156/79 (!) 146/94 (!) 153/87 (!) 153/88  Pulse: (!) 54 71 (!) 56 (!) 57  Resp: _0 Temp: 98.1 F (36.7 C) 97.9 F (36.6 C) 97.7 F (36.5 C) (!) 97.4 F (36.3 C)  TempSrc:  Oral    SpO2: 98% 100% 99% 98%  Weight:      Height:       No intake or output data in the 24 hours ending 07/19/20 0758  Filed Weights   07/14/20 1113 07/15/20 1532  Weight: 49.9 kg 52.3 kg    Examination:  General exam: Appears comfortable  Respiratory system: diminished breath sounds b/l Cardiovascular system: S1/S2+. No rubs or clicks  Gastrointestinal system: Abd is soft, NT, ND & hypoactive bowel sounds  Central nervous system: Alert and oriented. Moves all extremities  Psychiatry: judgement and insight appear normal. Flat mood and affect    Data Reviewed: I have personally reviewed following labs and imaging studies  CBC: Recent Labs  Lab 07/15/20 0638 07/16/20 0530 07/17/20 0511 07/18/20 0419 07/19/20  0504  WBC 11.7* 10.1 10.5 11.8* 16.1*  NEUTROABS 10.7*  --   --   --   --   HGB 12.3 11.8* 12.7 12.2 13.0  HCT 35.0* 33.4* 36.5 35.8* 36.4  MCV 93.1 94.6 95.3 95.5 94.1  PLT 268 283 315 313 578   Basic Metabolic Panel: Recent Labs  Lab 07/15/20 0638 07/16/20 0530 07/17/20 0511 07/18/20 0419 07/19/20 0504  NA 128* 129* 131* 131* 130*  K 3.5 4.1 4.1 4.1 4.4  CL 85* 89* 89* 92* 90*  CO2 34* 37* 36* 36* 36*  GLUCOSE 112* 150* 136* 126* 135*  BUN _0 CREATININE 0.48 0.47 0.48 0.38* 0.41*   CALCIUM 7.7* 8.3* 8.5* 8.4* 8.2*  MG 1.7  --   --   --   --    GFR: Estimated Creatinine Clearance: 50.2 mL/min (A) (by C-G formula based on SCr of 0.41 mg/dL (L)). Liver Function Tests: Recent Labs  Lab 07/14/20 1118 07/15/20 0638  AST 30 27  ALT 20 17  ALKPHOS 50 36*  BILITOT 0.7 0.6  PROT 6.9 5.2*  ALBUMIN 3.9 2.5*   No results for input(s): LIPASE, AMYLASE in the last 168 hours. No results for input(s): AMMONIA in the last 168 hours. Coagulation Profile: Recent Labs  Lab 07/14/20 1158  INR 1.0   Cardiac Enzymes: No results for input(s): CKTOTAL, CKMB, CKMBINDEX, TROPONINI in the last 168 hours. BNP (last 3 results) No results for input(s): PROBNP in the last 8760 hours. HbA1C: No results for input(s): HGBA1C in the last 72 hours. CBG: No results for input(s): GLUCAP in the last 168 hours. Lipid Profile: No results for input(s): CHOL, HDL, LDLCALC, TRIG, CHOLHDL, LDLDIRECT in the last 72 hours. Thyroid Function Tests: No results for input(s): TSH, T4TOTAL, FREET4, T3FREE, THYROIDAB in the last 72 hours. Anemia Panel: Recent Labs    07/18/20 0419 07/19/20 0504  FERRITIN 208 246   Sepsis Labs: Recent Labs  Lab 07/14/20 1158 07/14/20 1335  PROCALCITON <0.10  --   LATICACIDVEN 2.5* 1.7    Recent Results (from the past 240 hour(s))  Blood culture (routine x 2)     Status: None   Collection Time: 07/14/20 11:58 AM   Specimen: BLOOD  Result Value Ref Range Status   Specimen Description BLOOD LEFT ANTECUBITAL  Final   Special Requests   Final    BOTTLES DRAWN AEROBIC AND ANAEROBIC Blood Culture adequate volume   Culture   Final    NO GROWTH 5 DAYS Performed at [facility], [address], [place], [place] [address]    Report Status 07/19/2020 FINAL  Final  Blood culture (routine x 2)     Status: None   Collection Time: 07/14/20 12:17 PM   Specimen: BLOOD  Result Value Ref Range Status   Specimen Description BLOOD BLOOD LEFT ARM  Final    Special Requests   Final    BOTTLES DRAWN AEROBIC AND ANAEROBIC Blood Culture adequate volume   Culture   Final    NO GROWTH 5 DAYS Performed at [facility], [address] [place], [place] [address]    Report Status 07/19/2020 FINAL  Final  Resp Panel by RT-PCR (Flu A&B, Covid) Urine, Clean Catch     Status: Abnormal   Collection Time: 07/14/20  2:03 PM   Specimen: Urine, Clean Catch; Nasopharyngeal(NP) swabs in vial transport medium  Result Value Ref Range Status   SARS Coronavirus 2 by RT PCR POSITIVE (  A) NEGATIVE Final    Comment: RESULT CALLED TO, READ BACK BY AND VERIFIED WITH: ZACK REGISTER _0  07/14/20 MJU (NOTE) SARS-CoV-2 target nucleic acids are DETECTED.  The SARS-CoV-2 RNA is generally detectable in upper respiratory specimens during the acute phase of infection. Positive results are indicative of the presence of the identified virus, but do not rule out bacterial infection or co-infection with other pathogens not detected by the test. Clinical correlation with patient history and other diagnostic information is necessary to determine patient infection status. The expected result is Negative.  Fact Sheet for Patients: EntrepreneurPulse.com.au  Fact Sheet for Healthcare Providers: IncredibleEmployment.be  This test is not yet approved or cleared by the [place] FDA and  has been authorized for detection and/or diagnosis of SARS-CoV-2 by FDA under an Emergency Use Authorization (EUA).  This EUA will remain in effect (meaning this test can be u sed) for the duration of  the COVID-19 declaration under Section 564(b)(1) of the Act, 21 U.S.C. section 360bbb-3(b)(1), unless the authorization is terminated or revoked sooner.     Influenza A by PCR NEGATIVE NEGATIVE Final   Influenza B by PCR NEGATIVE NEGATIVE Final    Comment: (NOTE) The Xpert Xpress SARS-CoV-2/FLU/RSV plus assay is intended as an aid in the  diagnosis of influenza from Nasopharyngeal swab specimens and should not be used as a sole basis for treatment. Nasal washings and aspirates are unacceptable for Xpert Xpress SARS-CoV-2/FLU/RSV testing.  Fact Sheet for Patients: EntrepreneurPulse.com.au  Fact Sheet for Healthcare Providers: IncredibleEmployment.be  This test is not yet approved or cleared by the [place] FDA and has been authorized for detection and/or diagnosis of SARS-CoV-2 by FDA under an Emergency Use Authorization (EUA). This EUA will remain in effect (meaning this test can be used) for the duration of the COVID-19 declaration under Section 564(b)(1) of the Act, 21 U.S.C. section 360bbb-3(b)(1), unless the authorization is terminated or revoked.  Performed at [facility], [address], [place], [place] [address]   Urine culture     Status: Abnormal   Collection Time: 07/14/20  2:06 PM   Specimen: In/Out Cath Urine  Result Value Ref Range Status   Specimen Description   Final    IN/OUT CATH URINE Performed at [facility], [address], [place], [place] [address]    Special Requests   Final    NONE Performed at [facility], [address] [place], [place] [address]    Culture (A)  Final    >=100,000 COLONIES/mL ESCHERICHIA COLI Confirmed Extended Spectrum Beta-Lactamase Producer (ESBL).  In bloodstream infections from ESBL organisms, carbapenems are preferred over piperacillin/tazobactam. They are shown to have a lower risk of mortality.    Report Status 07/17/2020 FINAL  Final   Organism ID, Bacteria ESCHERICHIA COLI (A)  Final      Susceptibility   Escherichia coli - MIC*    AMPICILLIN >=32 RESISTANT Resistant     CEFAZOLIN >=64 RESISTANT Resistant     CEFEPIME <=0.12 SENSITIVE Sensitive     CEFTRIAXONE >=64 RESISTANT Resistant     CIPROFLOXACIN >=4 RESISTANT Resistant     GENTAMICIN 8 INTERMEDIATE Intermediate      IMIPENEM <=0.25 SENSITIVE Sensitive     NITROFURANTOIN <=16 SENSITIVE Sensitive     TRIMETH/SULFA >=320 RESISTANT Resistant     AMPICILLIN/SULBACTAM >=32 RESISTANT Resistant     PIP/TAZO 16 SENSITIVE Sensitive     * >=100,000 COLONIES/mL ESCHERICHIA COLI         Radiology  Studies: No results found.      Scheduled Meds:  apixaban  5 mg Oral BID   vitamin C  500 mg Oral Daily   bisacodyl  10 mg Rectal Once   dexamethasone (DECADRON) injection  4 mg Intravenous Q24H   digoxin  125 mcg Oral Daily   diltiazem  180 mg Oral Daily   ferrous sulfate  325 mg Oral Daily   gabapentin  300 mg Oral QID   glycopyrrolate  1 mg Oral TID   levalbuterol  2 puff Inhalation Q6H   LORazepam  0.5 mg Oral QHS   losartan  100 mg Oral Daily   metoprolol succinate  100 mg Oral Daily   multivitamin with minerals  1 tablet Oral Daily   nystatin  5 mL Oral QID   pantoprazole  40 mg Oral Daily   senna  2 tablet Oral QHS   zinc sulfate  220 mg Oral Daily   Continuous Infusions:  azithromycin 500 mg (07/18/20 1713)   meropenem (MERREM) IV 1 g (07/19/20 0228)     LOS: 4 days    Time spent: 25 mins     Wyvonnia Dusky, MD [facility] Pager [phone]  If 7PM-7AM, please contact night-coverage  07/19/2020, 7:58 AM

## 2020-07-20 DIAGNOSIS — J471 Bronchiectasis with (acute) exacerbation: Secondary | ICD-10-CM | POA: Diagnosis not present

## 2020-07-20 DIAGNOSIS — U071 COVID-19: Secondary | ICD-10-CM | POA: Diagnosis not present

## 2020-07-20 DIAGNOSIS — N390 Urinary tract infection, site not specified: Secondary | ICD-10-CM | POA: Diagnosis not present

## 2020-07-20 LAB — BASIC METABOLIC PANEL
Anion gap: 6 (ref 5–15)
BUN: 18 mg/dL (ref 8–23)
CO2: 37 mmol/L — ABNORMAL HIGH (ref 22–32)
Calcium: 8.3 mg/dL — ABNORMAL LOW (ref 8.9–10.3)
Chloride: 86 mmol/L — ABNORMAL LOW (ref 98–111)
Creatinine, Ser: 0.36 mg/dL — ABNORMAL LOW (ref 0.44–1.00)
GFR, Estimated: >60 mL/min (ref >60)
Glucose, Bld: 132 mg/dL — ABNORMAL HIGH (ref 70–99)
Potassium: 4.9 mmol/L (ref 3.5–5.1)
Sodium: 129 mmol/L — ABNORMAL LOW (ref 135–145)

## 2020-07-20 LAB — CBC
HCT: 36.3 % (ref 36.0–46.0)
Hemoglobin: 12.8 g/dL (ref 12.0–15.0)
MCH: 32.7 pg (ref 26.0–34.0)
MCHC: 35.3 g/dL (ref 30.0–36.0)
MCV: 92.8 fL (ref 80.0–100.0)
Platelets: 308 10*3/uL (ref 150–400)
RBC: 3.91 MIL/uL (ref 3.87–5.11)
RDW: 11.8 % (ref 11.5–15.5)
WBC: 15.2 10*3/uL — ABNORMAL HIGH (ref 4.0–10.5)
nRBC: 0 % (ref 0.0–0.2)

## 2020-07-20 LAB — FERRITIN: Ferritin: 275 ng/mL (ref 11–307)

## 2020-07-20 LAB — C-REACTIVE PROTEIN: CRP: 0.7 mg/dL (ref <1.0)

## 2020-07-20 MED ORDER — PREDNISONE 20 MG PO TABS: 20.0000 mg | ORAL_TABLET | Freq: Every day | ORAL | 0 refills | Status: AC

## 2020-07-20 MED ORDER — LEVOFLOXACIN 750 MG PO TABS: 750.0000 mg | ORAL_TABLET | Freq: Every day | ORAL | 0 refills | Status: AC

## 2020-07-20 NOTE — Progress Notes (Signed)
Pt discharged to home with ride from family. Discharge instructions given to pt and discussed. All questions answered. Iv removed. Tele removed. Assisted pt to car.

## 2020-07-20 NOTE — Care Management Important Message (Signed)
Important Message  Patient Details  Name: Ana Washington MRN: [id number] Date of Birth: 15-Sep-1945   Medicare Important Message Given:  No  Patient discharged prior to attempt to review Medicare IM.  Previously reviewed on 07/17/20 with copy of Medicare IM emailed securely to patient's email on file.     Dannette Barbara 07/20/2020, 2:55 PM

## 2020-07-20 NOTE — Discharge Summary (Signed)
Physician Discharge Summary  Ana Washington DPO:[id number] DOB: [birth year] DOA: 07/14/2020  PCP: [name], MD  Admit date: 07/14/2020 Discharge date: 07/20/2020  Admitted From: home  Disposition:  home w/ home health   Recommendations for Outpatient Follow-up:  Follow up with PCP in 1-2 weeks F/u w/ pulmon, Dr. Lanney Gins in 1-2 weeks  Home Health: yes Equipment/Devices:  Discharge Condition: stable  CODE STATUS: full  Diet recommendation: Heart Healthy  Brief/Interim Summary: HPI was taken from Dr. Cyd Silence:  75 year old female with past medical history of bronchiectasis, MAC, chronic respiratory failure (on 2lpm via [place] at home), Takotsubo cardiomyopathy (2015 at [facility]), gastroesophageal reflux disease, hypertension, paroxysmal atrial fibrillation (on Eliquis), generalized anxiety disorder, diastolic congestive heart failure (Echo 03/2020 EF 50-55% with G1DD) who presents to  at [facility]  emergency department with complaints of shortness of breath.  Patient explains that approximately 10 days ago her husband became unwell.  He began to develop cough and shortness of breath.  On Sunday, June 5th she and her husband both used an at home test and were positive for COVID.  At that time, she was asymptomatic.   In the days that followed, her husband began to recover however she began to develop very mild symptoms of malaise and increasing frequency of cough.  She contacted her physician early the week of June 6 and he prescribed her a home regimen of Paxlovid and was instructed to take a 5-day course.  Patient explains that she attempted to take this medications as instructed but began to feel that it caused her "throat to swell up."  She contacted her provider again the next day and instructed her to stop taking the medication.  She was instead provided with a short   By the following weekend, her husband symptoms had nearly resolved however she began to develop severe weakness, malaise  increasing cough and now shortness of breath.  Patient repeated her COVID-19 home test on Sunday 6/12 and was once again found to be positive while her husband tested negative.   Patient's symptoms continue to worsen.  Patient complains of severe shortness of breath, worse with minimal exertion and improved with rest.  Patient complains of associated severe cough that has increased compared to her chronic cough productive of green sputum.  The severe shortness of breath persisted despite increasing frequency of nebulized treatments at home as well as increasing her home regimen of supplemental oxygen to 3 L via nasal cannula.  Patient also complains of associated fevers poor appetite and muscle aches.   Patient symptoms continue to worsen until she eventually presented to [facility] emergency department for evaluation.     Upon evaluation in the emergency department patient was provided 500 cc of isotonic intravenous fluids as well as given a one-time dose of cefepime.  Patient was found to have a somewhat elevated lactic acid of 2.5 as well as an elevated white blood cell count of 16.9.  CT angiogram of the chest was also performed which was found to be negative for pulmonary embolism.  It did however reveal multifocal bronchiectasis with mucous plugging and mosaic attenuation consistent with air trapping.  The hospitalist group was then called to assist the patient for admission to the hospital.   Hospital course from Dr. Jimmye Norman 6/15-6/20/22: Pt was found to have COVID19 infection, acute exacerbation of bronchiectasis & UTI.  Pt has hx of MAC as well. Pt was treated w/ IV abxs, steroids, bronchodilators and incentive spirometry while inpatient. Pt was d/c home  w/ po levaquin, prednisone as per pulmon. Pt will f/u w/ pulmon, Dr. Lanney Gins in 1-2 weeks. Of note, pt completed her course of remdesivir while inpatient. PT/OT evaluated pt and recommended home health which was set up by CM prior to d/c. For more  information, please see previous progress/consult notes.   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 Continue on IV merrem while inpatient and switch to po levaquin & steroids at d/c. Continue on bronchodilators and encourage incentive spirometry. Pulmon following and recs apprec   COVID19 infection: completed remdesivir course. Continue steroids and bronchodilators. Encourage incentive spirometry   UTI: urine cx growing ESBL e. coli. Continue on IV merrem    Possibly dysphagia: continue on dysphagia III diet as per speech   Oral thrush: continue on nystatin     Hyponatremia: labile. Will continue to monitor    Lactic acidosis: resolved   Sepsis: met criteria w/ tachycardia, luekocytosis, fever & COVID19 infection & UTI. Resolved   HTN: continue on BB, CCB    PAF: continue on eliquis, CCB, BB    Hx of CAD: w/ minimally elevated troponins likely secondary to demand ischemia   MAC: continue on IV azithromycin, merrem. Pulmon following and recs apprec   GERD: continue on PPI  Discharge Instructions  Discharge Instructions     Diet - low sodium heart healthy   Complete by: As directed    Dysphagia III diet   Discharge instructions   Complete by: As directed    F/u w/ PCP in 1-2 weeks. F/u w/ pulmon in 1-2 weeks   Increase activity slowly   Complete by: As  directed       Allergies as of 07/20/2020       Reactions   Codeine Nausea And Vomiting   Sulfa Antibiotics Diarrhea   Penicillins Rash   Has patient had a PCN reaction causing immediate rash, facial/tongue/throat swelling, SOB or lightheadedness with hypotension: Unknown Has patient had a PCN reaction causing severe rash involving mucus membranes or skin necrosis: Unknown Has patient had a PCN reaction that required hospitalization: Unknown Has patient had a PCN reaction occurring within the last 10 years: No If all of the above answers are "NO", then may proceed with Cephalosporin use.        Medication List     STOP taking these medications    amikacin 500 MG/2ML Soln injection Commonly known as: AMIKIN   azithromycin 250 MG tablet Commonly known as: ZITHROMAX   ciprofloxacin 500 MG tablet Commonly known as: CIPRO   diltiazem 180 MG 24 hr capsule Commonly known as: TIAZAC   methylPREDNISolone sodium succinate 40 mg/mL injection Commonly known as: SOLU-MEDROL   Paxlovid 20 x 150 MG & 10 x 100MG Tbpk Generic drug: Nirmatrelvir & Ritonavir       TAKE these medications    acetaminophen 500 MG tablet Commonly known as: TYLENOL Take 1,000 mg by mouth every 6 (six) hours as needed for mild  pain or headache.   acetylcysteine 20 % nebulizer solution Commonly known as: MUCOMYST Take 4 mLs by nebulization daily.   Align 4 MG Caps Take 4 mg by mouth daily.   amLODipine 2.5 MG tablet Commonly known as: NORVASC Take 2.5 mg by mouth daily.   apixaban 5 MG Tabs tablet Commonly known as: ELIQUIS Take 5 mg by mouth 2 (two) times daily.   benzonatate 200 MG capsule Commonly known as: TESSALON Take 200 mg by mouth 3 (three) times daily as needed.   Combivent Respimat 20-100 MCG/ACT Aers respimat Generic drug: Ipratropium-Albuterol 1 puff every 6 (six) hours as needed for wheezing.   digoxin 0.125 MG tablet Commonly known as: LANOXIN Take 125 mcg by mouth  daily.   diltiazem 180 MG 24 hr capsule Commonly known as: CARDIZEM CD Take 180 mg by mouth daily.   ferrous sulfate 325 (65 FE) MG EC tablet Take 325 mg by mouth daily.   furosemide 20 MG tablet Commonly known as: LASIX Take 20 mg by mouth daily as needed for edema.   gabapentin 300 MG capsule Commonly known as: NEURONTIN Take 300 mg by mouth 4 (four) times daily.   glycopyrrolate 1 MG tablet Commonly known as: ROBINUL Take 1 tablet by mouth 3 (three) times daily.   levalbuterol 0.31 MG/3ML nebulizer solution Commonly known as: XOPENEX Inhale 3 mLs into the lungs every 6 (six) hours as needed.   levofloxacin 750 MG tablet Commonly known as: Levaquin Take 1 tablet (750 mg total) by mouth daily for 5 days.   LORazepam 0.5 MG tablet Commonly known as: ATIVAN Take 1 tablet (0.5 mg total) by mouth at bedtime.   losartan 100 MG tablet Commonly known as: COZAAR Take 100 mg by mouth daily.   metoprolol succinate 100 MG 24 hr tablet Commonly known as: TOPROL-XL Take 1 tablet (100 mg total) by mouth daily.   NAC 600 MG Caps Generic drug: Acetylcysteine Take 600 mg by mouth daily after breakfast.   omeprazole 20 MG capsule Commonly known as: PRILOSEC Take 20 mg by mouth daily.   polyethylene glycol 17 g packet Commonly known as: MIRALAX / GLYCOLAX Take 17 g by mouth daily as needed for severe constipation.   potassium chloride 10 MEQ tablet Commonly known as: KLOR-CON Take 10 mEq by mouth daily.   predniSONE 20 MG tablet Commonly known as: DELTASONE Take 1 tablet (20 mg total) by mouth daily for 5 days. What changed: Another medication with the same name was removed. Continue taking this medication, and follow the directions you see here.   senna 8.6 MG Tabs tablet Commonly known as: SENOKOT Take 2 tablets (17.2 mg total) by mouth at bedtime.   tobramycin (PF) 300 MG/5ML nebulizer solution Commonly known as: TOBI Take 5 mLs by nebulization 2 (two) times  daily.   vitamin C 1000 MG tablet Take 1,000 mg by mouth daily.        Allergies  Allergen Reactions   Codeine Nausea And Vomiting   Sulfa Antibiotics Diarrhea   Penicillins Rash    Has patient had a PCN reaction causing immediate rash, facial/tongue/throat swelling, SOB or lightheadedness with hypotension: Unknown Has patient had a PCN reaction causing severe rash involving mucus membranes or skin necrosis: Unknown Has patient had a PCN reaction that required hospitalization: Unknown Has patient had a PCN reaction occurring within the last 10 years: No If all of the above answers are "NO", then may proceed with Cephalosporin use.     Consultations:  Pulmon    Procedures/Studies: DG Chest 2 View  Result Date: 07/14/2020 CLINICAL DATA:  75 year old female with shortness of breath and cough. EXAM: CHEST - 2 VIEW COMPARISON:  Chest CT 04/29/2020 and earlier. FINDINGS: Seated AP and lateral views of the chest. Large chronic left diaphragmatic hernia appears stable. Stable cardiac size and mediastinal contours. Bronchiectasis better demonstrated by CT. Chronic staple line in the right upper lobe. Stable coarse bilateral pulmonary interstitial opacity. No pneumothorax, pulmonary edema, or acute pulmonary opacity. Diffuse spinal compression fractures with at least 10 levels previously augmented. Stable visualized osseous structures. Negative visible bowel gas pattern. IMPRESSION: Chronic lung disease with bronchiectasis. Chronic left diaphragmatic hernia. No acute cardiopulmonary abnormality identified. Electronically Signed   By: Genevie Ann M.D.   On: 07/14/2020 11:54   CT Angio Chest PE W and/or Wo Contrast  Result Date: 07/14/2020 CLINICAL DATA:  PE suspected, high prob EXAM: CT ANGIOGRAPHY CHEST WITH CONTRAST TECHNIQUE: Multidetector CT imaging of the chest was performed using the standard protocol during bolus administration of intravenous contrast. Multiplanar CT image reconstructions and  MIPs were obtained to evaluate the vascular anatomy. CONTRAST:  5m OMNIPAQUE IOHEXOL 350 MG/ML SOLN COMPARISON:  Chest CT 04/29/2020 FINDINGS: Cardiovascular: Satisfactory opacification of the pulmonary arteries to the segmental level. No evidence of pulmonary embolism. Unchanged enlarged central pulmonary arteries compatible with pulmonary hypertension. Unchanged mild cardiomegaly. Atherosclerotic calcifications of the aorta. Coronary artery calcifications. Mediastinum/Nodes: There is no mediastinal, hilar, or axillary lymphadenopathy. Thyroid is unremarkable. There is a large hiatal hernia containing the a hepatic flexure of the colon and the stomach, similar to prior exam. Lungs/Pleura: Diffuse bronchial wall thickening with multifocal bronchiectasis, most severe in the right upper lobe and lung bases bilaterally. Multifocal mucous plugging. Similar architectural distortion in the of the left lower lobe. Prior resections of the lateral segment of the right upper lobe, the right middle lobe, and inferior lingula. There is small right-sided subpleural nodularity, increased from prior exam, largest measuring 2.0 x 0.6 cm, and another measuring 0.9 x 0.8 cm (series 7, images 53 and 41 respectively). Similar subpleural nodularity in the left upper lobe. Scattered lung scarring and tree-in-bud nodularity. Mosaic attenuation bilaterally, likely due to air trapping. Upper Abdomen: Unchanged left renal cyst. Prior cholecystectomy. No acute abnormality. Musculoskeletal: Unchanged multiple thoracolumbar compression deformities with vertebroplasties. Unchanged chronic T3 compression deformity. Partially visualized severe degenerative changes in the lower cervical spine. Review of the MIP images confirms the above findings. IMPRESSION: No evidence of pulmonary embolism. Unchanged enlarged pulmonary arteries suggestive of pulmonary hypertension. 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 Increased subpleural nodularity on the right, which may represent progression of disease. Chronic mosaic attenuation of the lungs likely due to air trapping. Recommend follow-up chest CT in 3 months and follow-up with pulmonology. Aortic Atherosclerosis (ICD10-I70.0). Electronically Signed   By: JMaurine Simmering  On: 07/14/2020 13:33       Subjective: Pt c/o fatigue    Discharge Exam: Vitals:   07/20/20 0456 07/20/20 0835  BP: (!) 159/81 (!) 155/85  Pulse: (!) 57 60  Resp: 20 16  Temp: 97.6 F (36.4 C) 97.6 F (36.4 C)  SpO2: 100% 99%   Vitals:   07/19/20 1000 07/19/20 2114 07/20/20 0456 07/20/20 0835  BP: (!) 151/89 (!) 139/98 (!) 159/81 (!) 155/85  Pulse: 60 60 (!) 57 60  Resp: _0 Temp: (!) 97.4 F (36.3 C) 97.9 F (  36.6 C) 97.6 F (36.4 C) 97.6 F (36.4 C)  TempSrc: Oral Oral Oral Oral  SpO2: 100% 96% 100% 99%  Weight:      Height:        General: Pt is alert, awake, not in acute distress. Frail appearing  Cardiovascular: S1/S2 +, no rubs, no gallops Respiratory: diminished breath sounds b/l Abdominal: Soft, NT, ND, bowel sounds + Extremities: no edema, no cyanosis    The results of significant diagnostics from this hospitalization (including imaging, microbiology, ancillary and laboratory) are listed below for reference.     Microbiology: Recent Results (from the past 240 hour(s))  Blood culture (routine x 2)     Status: None   Collection Time: 07/14/20 11:58 AM   Specimen: BLOOD  Result Value Ref Range Status   Specimen Description BLOOD LEFT ANTECUBITAL  Final   Special Requests   Final    BOTTLES DRAWN AEROBIC AND ANAEROBIC Blood Culture adequate volume   Culture   Final    NO GROWTH 5 DAYS Performed at [facility], [address], [place], [place] [address]    Report Status 07/19/2020 FINAL  Final  Blood culture (routine x 2)     Status: None    Collection Time: 07/14/20 12:17 PM   Specimen: BLOOD  Result Value Ref Range Status   Specimen Description BLOOD BLOOD LEFT ARM  Final   Special Requests   Final    BOTTLES DRAWN AEROBIC AND ANAEROBIC Blood Culture adequate volume   Culture   Final    NO GROWTH 5 DAYS Performed at [facility], [address] [place], [place] [address]    Report Status 07/19/2020 FINAL  Final  Resp Panel by RT-PCR (Flu A&B, Covid) Urine, Clean Catch     Status: Abnormal   Collection Time: 07/14/20  2:03 PM   Specimen: Urine, Clean Catch; Nasopharyngeal(NP) swabs in vial transport medium  Result Value Ref Range Status   SARS Coronavirus 2 by RT PCR POSITIVE (A) NEGATIVE Final    Comment: RESULT CALLED TO, READ BACK BY AND VERIFIED WITH: ZACK REGISTER _0  07/14/20 MJU (NOTE) SARS-CoV-2 target nucleic acids are DETECTED.  The SARS-CoV-2 RNA is generally detectable in upper respiratory specimens during the acute phase of infection. Positive results are indicative of the presence of the identified virus, but do not rule out bacterial infection or co-infection with other pathogens not detected by the test. Clinical correlation with patient history and other diagnostic information is necessary to determine patient infection status. The expected result is Negative.  Fact Sheet for Patients: EntrepreneurPulse.com.au  Fact Sheet for Healthcare Providers: IncredibleEmployment.be  This test is not yet approved or cleared by the [place] FDA and  has been authorized for detection and/or diagnosis of SARS-CoV-2 by FDA under an Emergency Use Authorization (EUA).  This EUA will remain in effect (meaning this test can be u sed) for the duration of  the COVID-19 declaration under Section 564(b)(1) of the Act, 21 U.S.C. section 360bbb-3(b)(1), unless the authorization is terminated or revoked sooner.     Influenza A by PCR NEGATIVE NEGATIVE Final    Influenza B by PCR NEGATIVE NEGATIVE Final    Comment: (NOTE) The Xpert Xpress SARS-CoV-2/FLU/RSV plus assay is intended as an aid in the diagnosis of influenza from Nasopharyngeal swab specimens and should not be used as a sole basis for treatment. Nasal washings and aspirates are unacceptable for Xpert Xpress SARS-CoV-2/FLU/RSV testing.  Fact Sheet for Patients: EntrepreneurPulse.com.au  Fact Sheet  for Healthcare Providers: IncredibleEmployment.be  This test is not yet approved or cleared by the [place] and has been authorized for detection and/or diagnosis of SARS-CoV-2 by FDA under an Emergency Use Authorization (EUA). This EUA will remain in effect (meaning this test can be used) for the duration of the COVID-19 declaration under Section 564(b)(1) of the Act, 21 U.S.C. section 360bbb-3(b)(1), unless the authorization is terminated or revoked.  Performed at [facility], [address], [place], [place] [address]   Urine culture     Status: Abnormal   Collection Time: 07/14/20  2:06 PM   Specimen: In/Out Cath Urine  Result Value Ref Range Status   Specimen Description   Final    IN/OUT CATH URINE Performed at [facility], [address], [place], [place] [address]    Special Requests   Final    NONE Performed at [facility], [address] [place], [place] [address]    Culture (A)  Final    >=100,000 COLONIES/mL ESCHERICHIA COLI Confirmed Extended Spectrum Beta-Lactamase Producer (ESBL).  In bloodstream infections from ESBL organisms, carbapenems are preferred over piperacillin/tazobactam. They are shown to have a lower risk of mortality.    Report Status 07/17/2020 FINAL  Final   Organism ID, Bacteria ESCHERICHIA COLI (A)  Final      Susceptibility   Escherichia coli - MIC*    AMPICILLIN >=32 RESISTANT Resistant     CEFAZOLIN >=64 RESISTANT Resistant     CEFEPIME <=0.12 SENSITIVE  Sensitive     CEFTRIAXONE >=64 RESISTANT Resistant     CIPROFLOXACIN >=4 RESISTANT Resistant     GENTAMICIN 8 INTERMEDIATE Intermediate     IMIPENEM <=0.25 SENSITIVE Sensitive     NITROFURANTOIN <=16 SENSITIVE Sensitive     TRIMETH/SULFA >=320 RESISTANT Resistant     AMPICILLIN/SULBACTAM >=32 RESISTANT Resistant     PIP/TAZO 16 SENSITIVE Sensitive     * >=100,000 COLONIES/mL ESCHERICHIA COLI     Labs: BNP (last 3 results) Recent Labs    09/08/19 0650 04/29/20 0108 07/14/20 1118  BNP 101.3* 71.0 035.4*   Basic Metabolic Panel: Recent Labs  Lab 07/15/20 0638 07/16/20 0530 07/17/20 0511 07/18/20 0419 07/19/20 0504 07/20/20 0657  NA 128* 129* 131* 131* 130* 129*  K 3.5 4.1 4.1 4.1 4.4 4.9  CL 85* 89* 89* 92* 90* 86*  CO2 34* 37* 36* 36* 36* 37*  GLUCOSE 112* 150* 136* 126* 135* 132*  BUN _0 CREATININE 0.48 0.47 0.48 0.38* 0.41* 0.36*  CALCIUM 7.7* 8.3* 8.5* 8.4* 8.2* 8.3*  MG 1.7  --   --   --   --   --    Liver Function Tests: Recent Labs  Lab 07/14/20 1118 07/15/20 0638  AST 30 27  ALT 20 17  ALKPHOS 50 36*  BILITOT 0.7 0.6  PROT 6.9 5.2*  ALBUMIN 3.9 2.5*   No results for input(s): LIPASE, AMYLASE in the last 168 hours. No results for input(s): AMMONIA in the last 168 hours. CBC: Recent Labs  Lab 07/15/20 0638 07/16/20 0530 07/17/20 0511 07/18/20 0419 07/19/20 0504 07/20/20 0657  WBC 11.7* 10.1 10.5 11.8* 16.1* 15.2*  NEUTROABS 10.7*  --   --   --   --   --   HGB 12.3 11.8* 12.7 12.2 13.0 12.8  HCT 35.0* 33.4* 36.5 35.8* 36.4 36.3  MCV 93.1 94.6 95.3 95.5 94.1 92.8  PLT 268 283 315 313 332 308   Cardiac  Enzymes: No results for input(s): CKTOTAL, CKMB, CKMBINDEX, TROPONINI in the last 168 hours. BNP: Invalid input(s): POCBNP CBG: No results for input(s): GLUCAP in the last 168 hours. D-Dimer No results for input(s): DDIMER in the last 72 hours. Hgb A1c No results for input(s): HGBA1C in the last 72 hours. Lipid Profile No  results for input(s): CHOL, HDL, LDLCALC, TRIG, CHOLHDL, LDLDIRECT in the last 72 hours. Thyroid function studies No results for input(s): TSH, T4TOTAL, T3FREE, THYROIDAB in the last 72 hours.  Invalid input(s): FREET3 Anemia work up Recent Labs    07/19/20 0504 07/20/20 0657  FERRITIN 246 275   Urinalysis    Component Value Date/Time   COLORURINE YELLOW (A) 07/14/2020 1406   APPEARANCEUR CLEAR (A) 07/14/2020 1406   LABSPEC 1.023 07/14/2020 1406   PHURINE 7.0 07/14/2020 1406   GLUCOSEU NEGATIVE 07/14/2020 1406   HGBUR SMALL (A) 07/14/2020 1406   BILIRUBINUR NEGATIVE 07/14/2020 1406   [place] 07/14/2020 1406   [place] 07/14/2020 1406   NITRITE NEGATIVE 07/14/2020 1406   [place] 07/14/2020 1406   Sepsis Labs Invalid input(s): PROCALCITONIN,  WBC,  LACTICIDVEN Microbiology Recent Results (from the past 240 hour(s))  Blood culture (routine x 2)     Status: None   Collection Time: 07/14/20 11:58 AM   Specimen: BLOOD  Result Value Ref Range Status   Specimen Description BLOOD LEFT ANTECUBITAL  Final   Special Requests   Final    BOTTLES DRAWN AEROBIC AND ANAEROBIC Blood Culture adequate volume   Culture   Final    NO GROWTH 5 DAYS Performed at [facility], [address] [place], [place] [address]    Report Status 07/19/2020 FINAL  Final  Blood culture (routine x 2)     Status: None   Collection Time: 07/14/20 12:17 PM   Specimen: BLOOD  Result Value Ref Range Status   Specimen Description BLOOD BLOOD LEFT ARM  Final   Special Requests   Final    BOTTLES DRAWN AEROBIC AND ANAEROBIC Blood Culture adequate volume   Culture   Final    NO GROWTH 5 DAYS Performed at [facility], [address] [place], [place] [address]    Report Status 07/19/2020 FINAL  Final  Resp Panel by RT-PCR (Flu A&B, Covid) Urine, Clean Catch     Status: Abnormal   Collection Time: 07/14/20  2:03 PM   Specimen: Urine, Clean Catch;  Nasopharyngeal(NP) swabs in vial transport medium  Result Value Ref Range Status   SARS Coronavirus 2 by RT PCR POSITIVE (A) NEGATIVE Final    Comment: RESULT CALLED TO, READ BACK BY AND VERIFIED WITH: ZACK REGISTER _0  07/14/20 MJU (NOTE) SARS-CoV-2 target nucleic acids are DETECTED.  The SARS-CoV-2 RNA is generally detectable in upper respiratory specimens during the acute phase of infection. Positive results are indicative of the presence of the identified virus, but do not rule out bacterial infection or co-infection with other pathogens not detected by the test. Clinical correlation with patient history and other diagnostic information is necessary to determine patient infection status. The expected result is Negative.  Fact Sheet for Patients: EntrepreneurPulse.com.au  Fact Sheet for Healthcare Providers: IncredibleEmployment.be  This test is not yet approved or cleared by the [place] FDA and  has been authorized for detection and/or diagnosis of SARS-CoV-2 by FDA under an Emergency Use Authorization (EUA).  This EUA will remain in effect (meaning this test can be u sed) for the duration of  the COVID-19  declaration under Section 564(b)(1) of the Act, 21 U.S.C. section 360bbb-3(b)(1), unless the authorization is terminated or revoked sooner.     Influenza A by PCR NEGATIVE NEGATIVE Final   Influenza B by PCR NEGATIVE NEGATIVE Final    Comment: (NOTE) The Xpert Xpress SARS-CoV-2/FLU/RSV plus assay is intended as an aid in the diagnosis of influenza from Nasopharyngeal swab specimens and should not be used as a sole basis for treatment. Nasal washings and aspirates are unacceptable for Xpert Xpress SARS-CoV-2/FLU/RSV testing.  Fact Sheet for Patients: EntrepreneurPulse.com.au  Fact Sheet for Healthcare Providers: IncredibleEmployment.be  This test is not yet approved or cleared by the [place] FDA and has been authorized for detection and/or diagnosis of SARS-CoV-2 by FDA under an Emergency Use Authorization (EUA). This EUA will remain in effect (meaning this test can be used) for the duration of the COVID-19 declaration under Section 564(b)(1) of the Act, 21 U.S.C. section 360bbb-3(b)(1), unless the authorization is terminated or revoked.  Performed at [facility], [address] [place], [place] [address]   Urine culture     Status: Abnormal   Collection Time: 07/14/20  2:06 PM   Specimen: In/Out Cath Urine  Result Value Ref Range Status   Specimen Description   Final    IN/OUT CATH URINE Performed at [facility], [address], [place], [place] [address]    Special Requests   Final    NONE Performed at [facility], [address] [place], [place] [address]    Culture (A)  Final    >=100,000 COLONIES/mL ESCHERICHIA COLI Confirmed Extended Spectrum Beta-Lactamase Producer (ESBL).  In bloodstream infections from ESBL organisms, carbapenems are preferred over piperacillin/tazobactam. They are shown to have a lower risk of mortality.    Report Status 07/17/2020 FINAL  Final   Organism ID, Bacteria ESCHERICHIA COLI (A)  Final      Susceptibility   Escherichia coli - MIC*    AMPICILLIN >=32 RESISTANT Resistant     CEFAZOLIN >=64 RESISTANT Resistant     CEFEPIME <=0.12 SENSITIVE Sensitive     CEFTRIAXONE >=64 RESISTANT Resistant     CIPROFLOXACIN >=4 RESISTANT Resistant     GENTAMICIN 8 INTERMEDIATE Intermediate     IMIPENEM <=0.25 SENSITIVE Sensitive     NITROFURANTOIN <=16 SENSITIVE Sensitive     TRIMETH/SULFA >=320 RESISTANT Resistant     AMPICILLIN/SULBACTAM >=32 RESISTANT Resistant     PIP/TAZO 16 SENSITIVE Sensitive     * >=100,000 COLONIES/mL ESCHERICHIA COLI     Time coordinating discharge: Over 30 minutes  SIGNED:   Wyvonnia Dusky, MD  [facility] 07/20/2020, 10:49 AM Pager   If 7PM-7AM, please  contact night-coverage

## 2020-07-20 NOTE — Plan of Care (Signed)
  Problem: Education: Goal: Knowledge of General Education information will improve Description: Including pain rating scale, medication(s)/side effects and non-pharmacologic comfort measures 07/20/2020 1303 by Donzetta Matters, RN Outcome: Adequate for Discharge 07/20/2020 1302 by Donzetta Matters, RN Outcome: Adequate for Discharge   Problem: Health Behavior/Discharge Planning: Goal: Ability to manage health-related needs will improve 07/20/2020 1303 by Donzetta Matters, RN Outcome: Adequate for Discharge 07/20/2020 1302 by Donzetta Matters, RN Outcome: Adequate for Discharge   Problem: Clinical Measurements: Goal: Ability to maintain clinical measurements within normal limits will improve 07/20/2020 1303 by Donzetta Matters, RN Outcome: Adequate for Discharge 07/20/2020 1302 by Donzetta Matters, RN Outcome: Adequate for Discharge Goal: Will remain free from infection 07/20/2020 1303 by Donzetta Matters, RN Outcome: Adequate for Discharge 07/20/2020 1302 by Donzetta Matters, RN Outcome: Adequate for Discharge Goal: Diagnostic test results will improve 07/20/2020 1303 by Donzetta Matters, RN Outcome: Adequate for Discharge 07/20/2020 1302 by Donzetta Matters, RN Outcome: Adequate for Discharge Goal: Respiratory complications will improve 07/20/2020 1303 by Donzetta Matters, RN Outcome: Adequate for Discharge 07/20/2020 1302 by Donzetta Matters, RN Outcome: Adequate for Discharge Goal: Cardiovascular complication will be avoided 07/20/2020 1303 by Donzetta Matters, RN Outcome: Adequate for Discharge 07/20/2020 1302 by Donzetta Matters, RN Outcome: Adequate for Discharge   Problem: Activity: Goal: Risk for activity intolerance will decrease 07/20/2020 1303 by Donzetta Matters, RN Outcome: Adequate for Discharge 07/20/2020 1302 by Donzetta Matters, RN Outcome: Adequate for Discharge   Problem: Nutrition: Goal: Adequate nutrition will be maintained 07/20/2020 1303 by Donzetta Matters, RN Outcome: Adequate for Discharge 07/20/2020 1302  by Donzetta Matters, RN Outcome: Adequate for Discharge   Problem: Coping: Goal: Level of anxiety will decrease 07/20/2020 1303 by Donzetta Matters, RN Outcome: Adequate for Discharge 07/20/2020 1302 by Donzetta Matters, RN Outcome: Adequate for Discharge   Problem: Elimination: Goal: Will not experience complications related to bowel motility 07/20/2020 1303 by Donzetta Matters, RN Outcome: Adequate for Discharge 07/20/2020 1302 by Donzetta Matters, RN Outcome: Adequate for Discharge Goal: Will not experience complications related to urinary retention 07/20/2020 1303 by Donzetta Matters, RN Outcome: Adequate for Discharge 07/20/2020 1302 by Donzetta Matters, RN Outcome: Adequate for Discharge   Problem: Pain Managment: Goal: General experience of comfort will improve 07/20/2020 1303 by Donzetta Matters, RN Outcome: Adequate for Discharge 07/20/2020 1302 by Donzetta Matters, RN Outcome: Adequate for Discharge   Problem: Safety: Goal: Ability to remain free from injury will improve 07/20/2020 1303 by Donzetta Matters, RN Outcome: Adequate for Discharge 07/20/2020 1302 by Donzetta Matters, RN Outcome: Adequate for Discharge   Problem: Skin Integrity: Goal: Risk for impaired skin integrity will decrease 07/20/2020 1303 by Donzetta Matters, RN Outcome: Adequate for Discharge 07/20/2020 1302 by Donzetta Matters, RN Outcome: Adequate for Discharge

## 2020-07-20 NOTE — TOC Transition Note (Signed)
Transition of Care [facility]) - CM/SW Discharge Note   Patient Details  Name: Ana Washington MRN: [id number] Date of Birth: [birth year]  [facility]) CM/SW Contact:  Candie Chroman, LCSW Phone Number: 07/20/2020, 10:57 AM   Clinical Narrative:   Patient has orders to discharge home today. Left message for [facility] representative to notify. Home health orders were entered 6/17 for PT and OT. No further concerns. CSW signing off.  Final next level of care: [place] Barriers to Discharge: Barriers Resolved   Patient Goals and CMS Choice Patient states their goals for this hospitalization and ongoing recovery are:: home with home health CMS Medicare.gov Compare Post Acute Care list provided to:: Patient Choice offered to / list presented to : NA  Discharge Placement                    Patient and family notified of of transfer: 07/20/20  Discharge Plan and Services                          HH Arranged: PT, OT [facility] Agency: [place] Date [place]: 07/20/20   Representative spoke with at Agenda: [place] (Left message)  Social Determinants of Health (SDOH) Interventions     Readmission Risk Interventions Readmission Risk Prevention Plan 07/17/2020 01/06/2018  Transportation Screening Complete Complete  PCP or Specialist Appt within 5-7 Days - Complete  PCP or Specialist Appt within 3-5 Days Complete -  Home Care Screening - Complete  Medication Review (RN CM) - Complete  HRI or Home Care Consult Complete -  Social Work Consult for [place] Planning/Counseling Complete -  Palliative Care Screening Not Applicable -  Medication Review Press photographer) Complete -  Some recent data might be hidden

## 2020-07-24 ENCOUNTER — Other Ambulatory Visit
Admission: RE | Admit: 2020-07-24 | Discharge: 2020-07-24 | Disposition: A | Discharge status: Home / Self Care | Payer: Medicare HMO | Source: Ambulatory Visit | Attending: Pulmonary Disease | Admitting: Pulmonary Disease

## 2020-07-24 DIAGNOSIS — U071 COVID-19: Secondary | ICD-10-CM | POA: Diagnosis not present

## 2020-07-24 DIAGNOSIS — R600 Localized edema: Secondary | ICD-10-CM | POA: Diagnosis present

## 2020-07-24 LAB — BRAIN NATRIURETIC PEPTIDE: B Natriuretic Peptide: 340.9 pg/mL — ABNORMAL HIGH (ref 0.0–100.0)

## 2020-07-26 IMAGING — DX DG KNEE 1-2V PORT*R*
4 series · 4 of 4 positions shown · non-contrast
Comparison: None.

CLINICAL DATA: Right knee pain. No reported injury.

EXAM:
PORTABLE RIGHT KNEE - 1-2 VIEW

[knee ap]
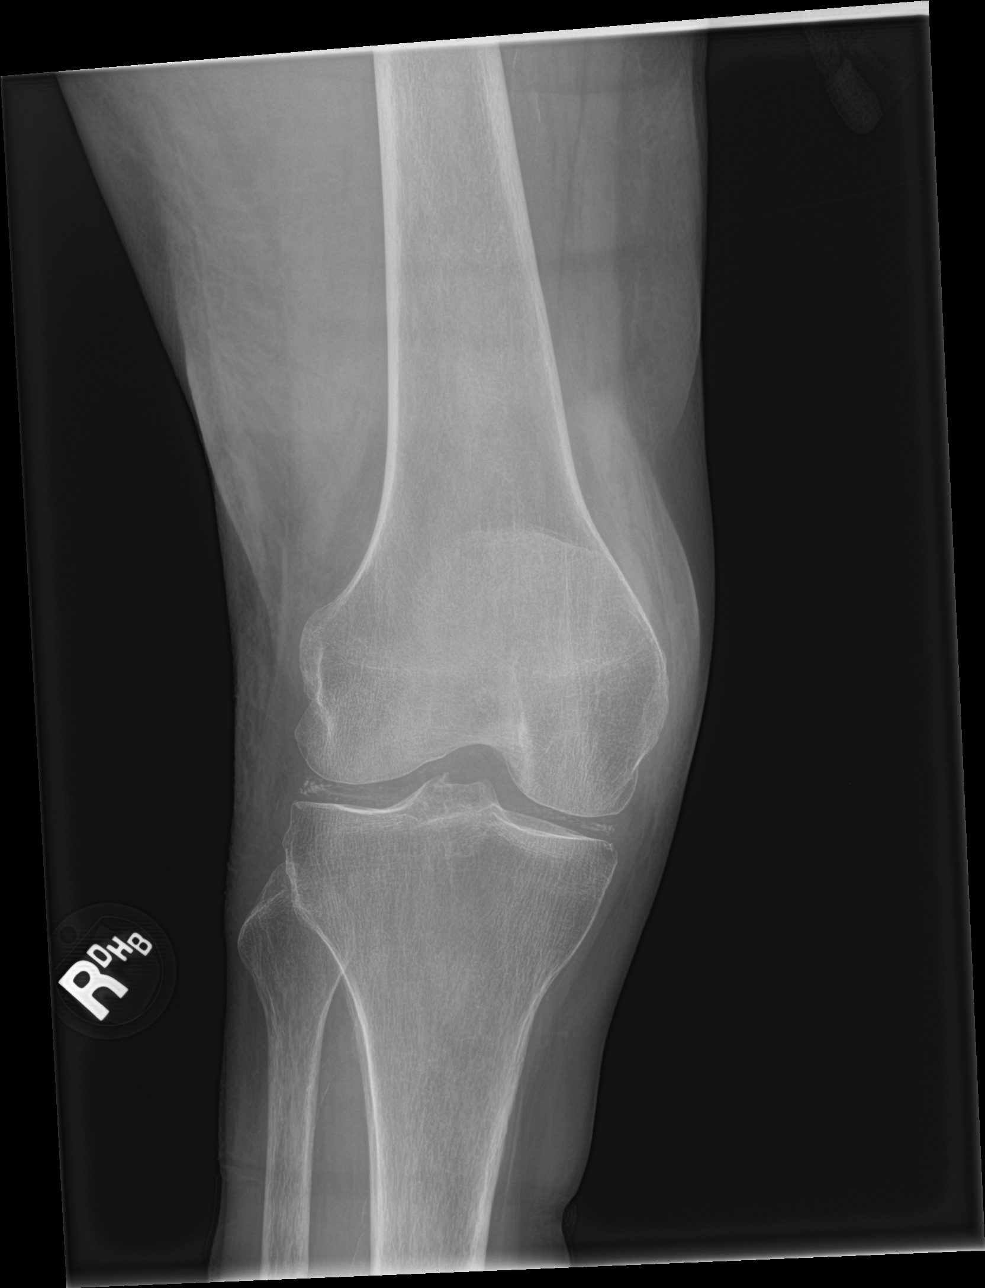

[knee lat]
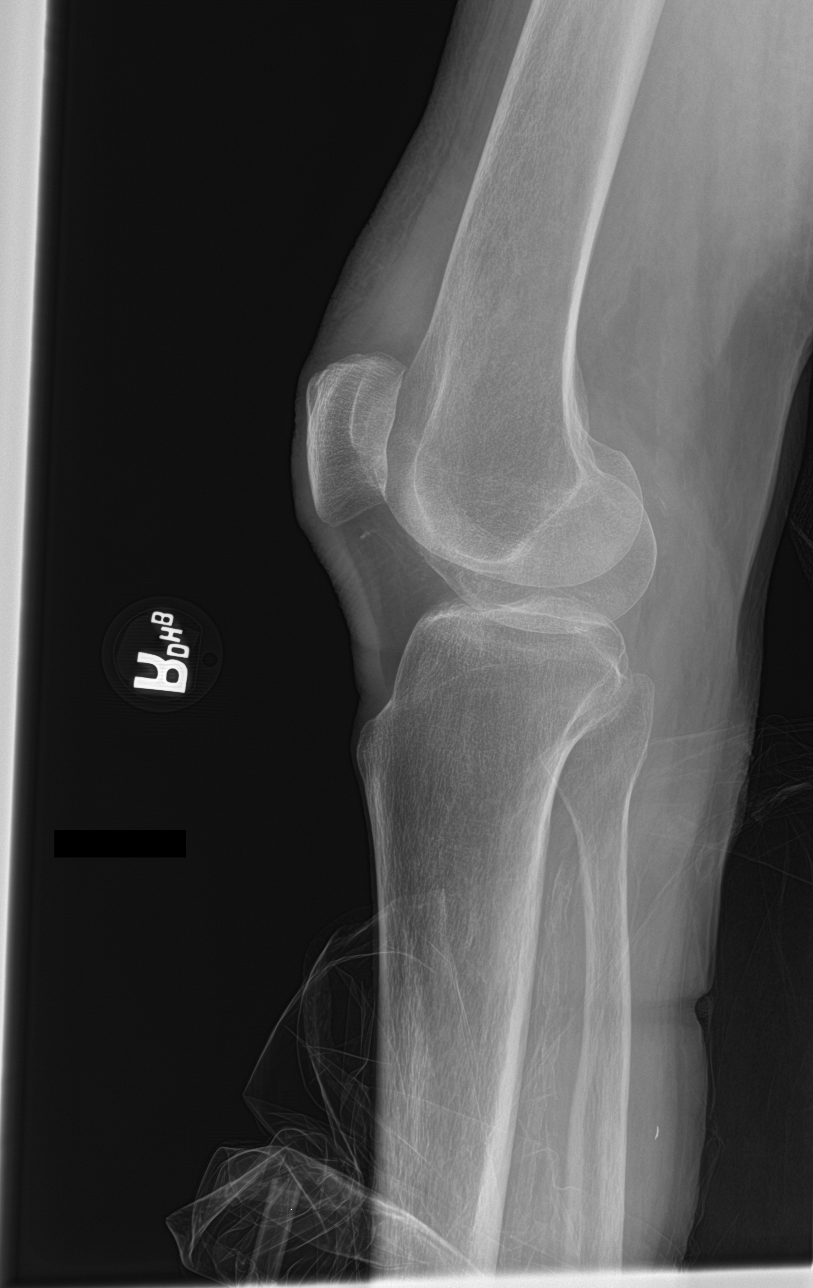

[knee obl (1 of 2)]
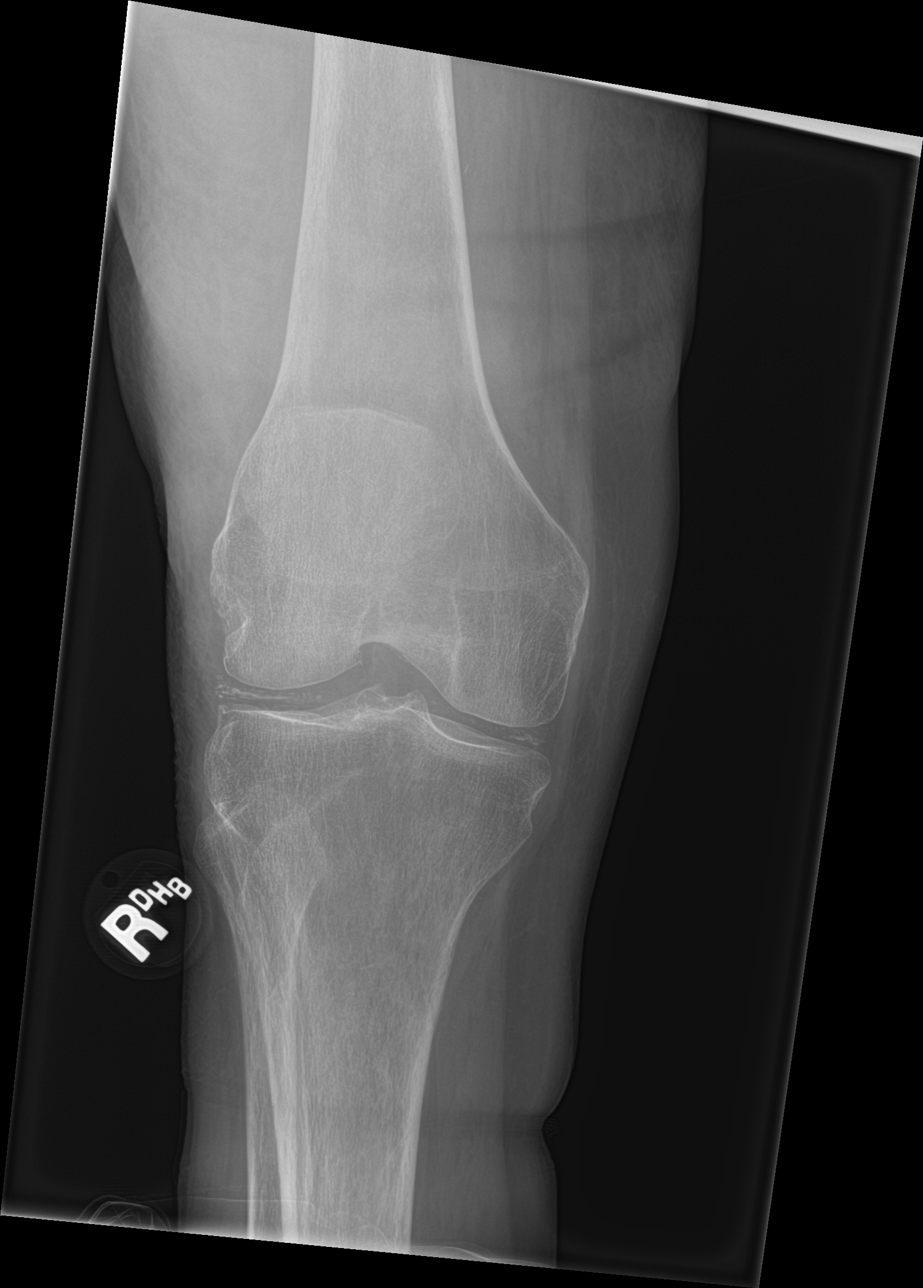

[knee obl (2 of 2)]
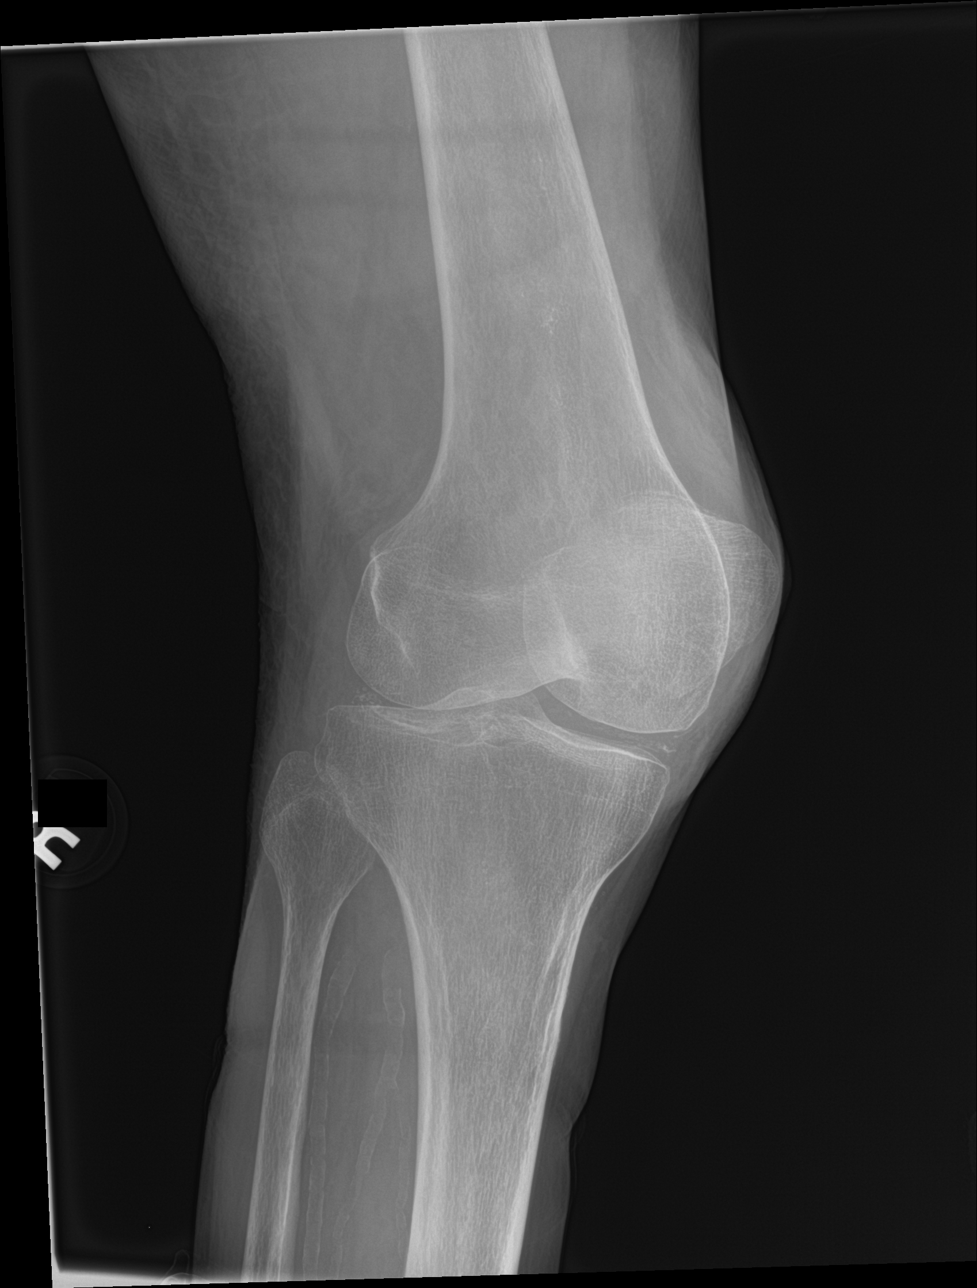

[4 of 4 positions shown; findings below may reference images not displayed]

FINDINGS: Medial and lateral meniscal calcifications. Minimal posterior
patellar spur formation. Minimal effusion. The bones appear
osteopenic. Arterial calcifications.
IMPRESSION: 1. Minimal degenerative changes and minimal effusion.
2. Chondrocalcinosis.

## 2020-07-26 IMAGING — DX DG CHEST 1V PORT
1 series · 1 of 1 positions shown · non-contrast
Comparison: CT scan and chest x-ray dated 09/30/2018 and chest
x-ray dated 09/27/2018

CLINICAL DATA: Pulmonary infiltrates.

EXAM:
PORTABLE CHEST 1 VIEW

[chest ap]
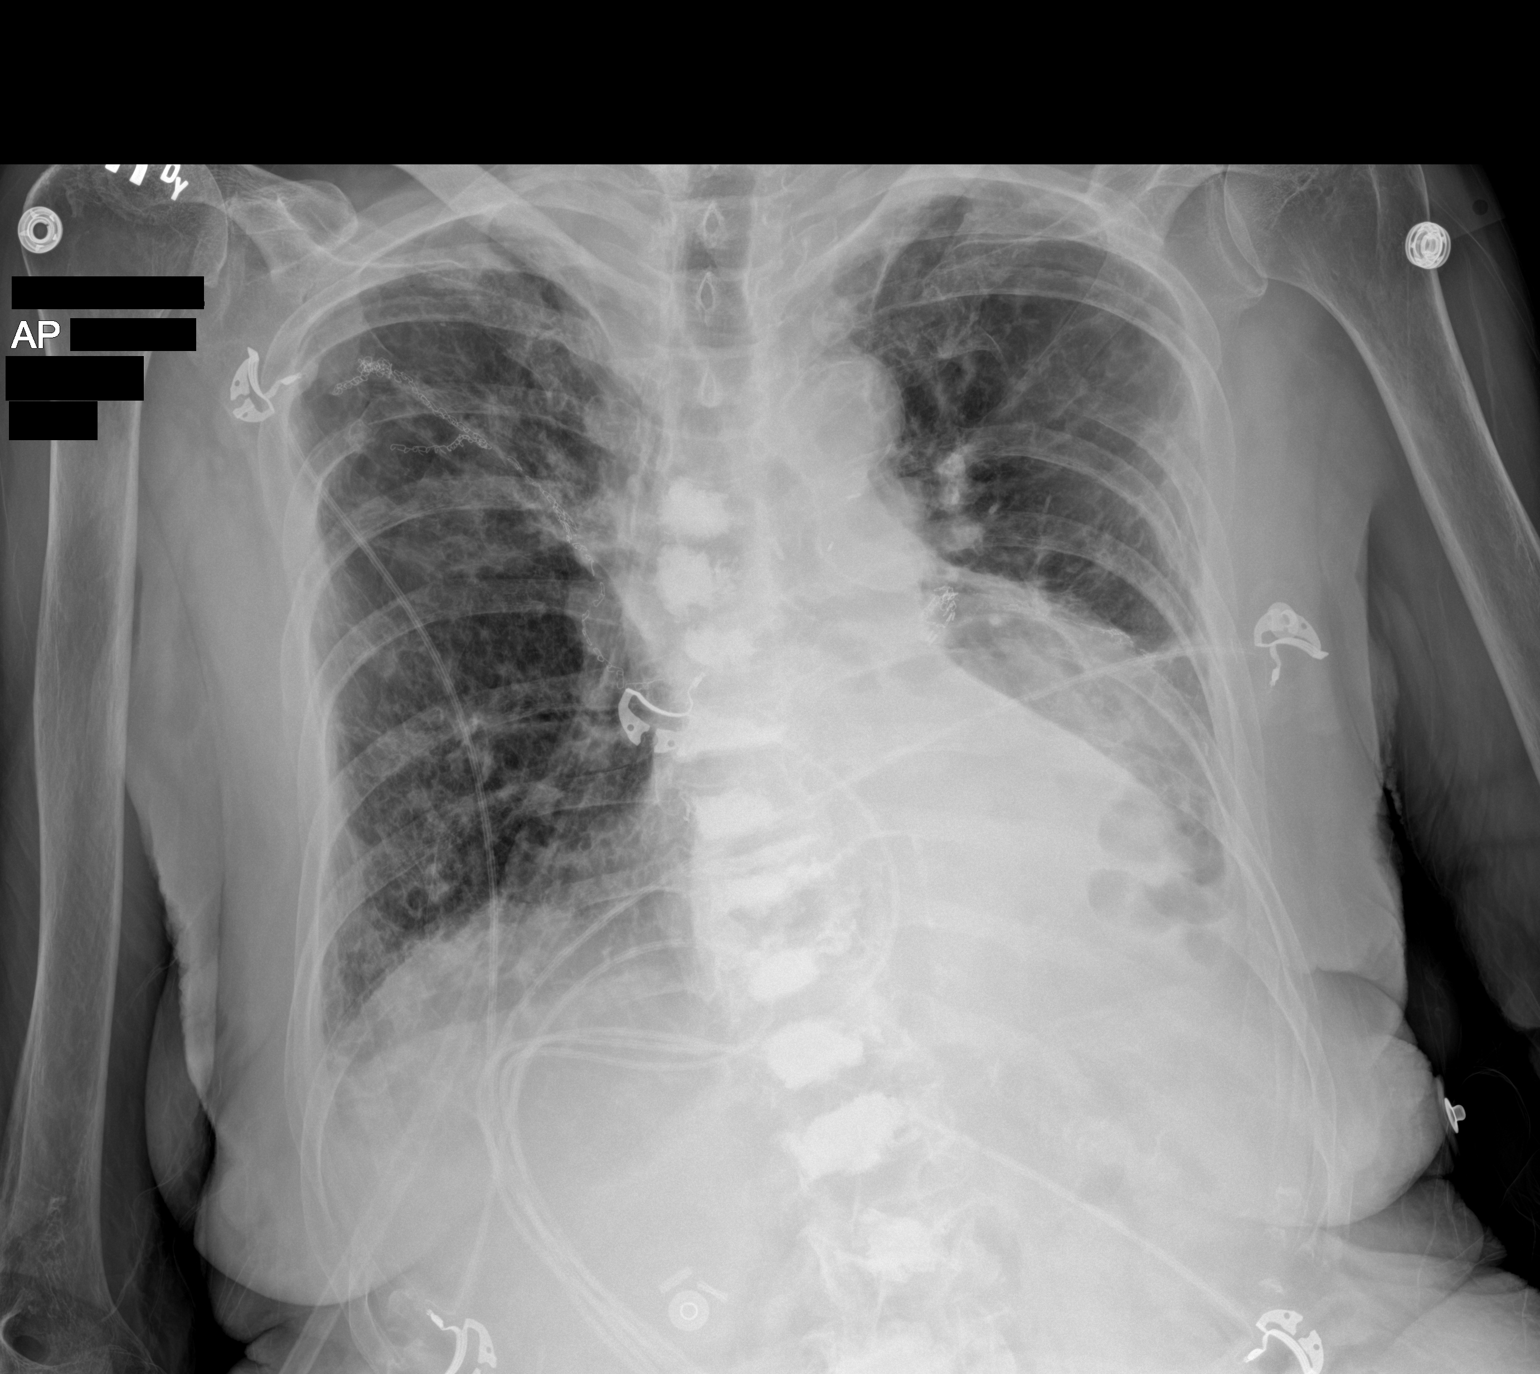

[1 of 1 positions shown; findings below may reference images not displayed]

FINDINGS: The hazy areas of infiltrate lungs have improved bilaterally.
Postsurgical changes in the right upper lobe are noted with surgical
staples. Huge hiatal hernia appears unchanged. Chronic
bronchiectasis is not as apparent on chest x-ray as on the CT scan.

Heart size and pulmonary vascularity are normal. No acute bone
abnormality.
IMPRESSION: Improving bilateral infiltrates.

## 2020-07-30 ENCOUNTER — Other Ambulatory Visit: Payer: Self-pay

## 2020-07-30 ENCOUNTER — Encounter: Payer: Self-pay | Admitting: Radiology

## 2020-07-30 ENCOUNTER — Emergency Department: Payer: Medicare HMO

## 2020-07-30 ENCOUNTER — Inpatient Hospital Stay
Admission: EM | Admit: 2020-07-30 | Discharge: 2020-08-07 | DRG: 871 | Disposition: A | Discharge status: Home Health | Payer: Medicare HMO | Attending: Student | Admitting: Student

## 2020-07-30 DIAGNOSIS — A419 Sepsis, unspecified organism: Principal | ICD-10-CM | POA: Diagnosis present

## 2020-07-30 DIAGNOSIS — I252 Old myocardial infarction: Secondary | ICD-10-CM | POA: Diagnosis not present

## 2020-07-30 DIAGNOSIS — N390 Urinary tract infection, site not specified: Secondary | ICD-10-CM | POA: Diagnosis present

## 2020-07-30 DIAGNOSIS — I110 Hypertensive heart disease with heart failure: Secondary | ICD-10-CM | POA: Diagnosis present

## 2020-07-30 DIAGNOSIS — E871 Hypo-osmolality and hyponatremia: Secondary | ICD-10-CM | POA: Diagnosis present

## 2020-07-30 DIAGNOSIS — Z8249 Family history of ischemic heart disease and other diseases of the circulatory system: Secondary | ICD-10-CM | POA: Diagnosis not present

## 2020-07-30 DIAGNOSIS — F411 Generalized anxiety disorder: Secondary | ICD-10-CM | POA: Diagnosis present

## 2020-07-30 DIAGNOSIS — E43 Unspecified severe protein-calorie malnutrition: Secondary | ICD-10-CM | POA: Diagnosis present

## 2020-07-30 DIAGNOSIS — Z9049 Acquired absence of other specified parts of digestive tract: Secondary | ICD-10-CM | POA: Diagnosis not present

## 2020-07-30 DIAGNOSIS — I4891 Unspecified atrial fibrillation: Secondary | ICD-10-CM

## 2020-07-30 DIAGNOSIS — I2510 Atherosclerotic heart disease of native coronary artery without angina pectoris: Secondary | ICD-10-CM | POA: Diagnosis present

## 2020-07-30 DIAGNOSIS — I5032 Chronic diastolic (congestive) heart failure: Secondary | ICD-10-CM | POA: Diagnosis present

## 2020-07-30 DIAGNOSIS — R9389 Abnormal findings on diagnostic imaging of other specified body structures: Secondary | ICD-10-CM

## 2020-07-30 DIAGNOSIS — R64 Cachexia: Secondary | ICD-10-CM | POA: Diagnosis present

## 2020-07-30 DIAGNOSIS — K219 Gastro-esophageal reflux disease without esophagitis: Secondary | ICD-10-CM | POA: Diagnosis present

## 2020-07-30 DIAGNOSIS — J189 Pneumonia, unspecified organism: Secondary | ICD-10-CM | POA: Diagnosis present

## 2020-07-30 DIAGNOSIS — Z515 Encounter for palliative care: Secondary | ICD-10-CM

## 2020-07-30 DIAGNOSIS — Z885 Allergy status to narcotic agent status: Secondary | ICD-10-CM

## 2020-07-30 DIAGNOSIS — I4821 Permanent atrial fibrillation: Secondary | ICD-10-CM | POA: Diagnosis present

## 2020-07-30 DIAGNOSIS — G4700 Insomnia, unspecified: Secondary | ICD-10-CM | POA: Diagnosis present

## 2020-07-30 DIAGNOSIS — J470 Bronchiectasis with acute lower respiratory infection: Secondary | ICD-10-CM | POA: Diagnosis present

## 2020-07-30 DIAGNOSIS — J471 Bronchiectasis with (acute) exacerbation: Secondary | ICD-10-CM | POA: Diagnosis present

## 2020-07-30 DIAGNOSIS — J9611 Chronic respiratory failure with hypoxia: Secondary | ICD-10-CM | POA: Diagnosis present

## 2020-07-30 DIAGNOSIS — Z7901 Long term (current) use of anticoagulants: Secondary | ICD-10-CM | POA: Diagnosis not present

## 2020-07-30 DIAGNOSIS — Z681 Body mass index (BMI) 19 or less, adult: Secondary | ICD-10-CM

## 2020-07-30 DIAGNOSIS — D62 Acute posthemorrhagic anemia: Secondary | ICD-10-CM | POA: Diagnosis present

## 2020-07-30 DIAGNOSIS — U099 Post covid-19 condition, unspecified: Secondary | ICD-10-CM | POA: Diagnosis present

## 2020-07-30 DIAGNOSIS — E876 Hypokalemia: Secondary | ICD-10-CM | POA: Diagnosis present

## 2020-07-30 DIAGNOSIS — Z66 Do not resuscitate: Secondary | ICD-10-CM | POA: Diagnosis present

## 2020-07-30 DIAGNOSIS — Z880 Allergy status to penicillin: Secondary | ICD-10-CM

## 2020-07-30 DIAGNOSIS — Z882 Allergy status to sulfonamides status: Secondary | ICD-10-CM

## 2020-07-30 DIAGNOSIS — R5381 Other malaise: Secondary | ICD-10-CM | POA: Diagnosis present

## 2020-07-30 DIAGNOSIS — Z8711 Personal history of peptic ulcer disease: Secondary | ICD-10-CM | POA: Diagnosis not present

## 2020-07-30 DIAGNOSIS — Z7189 Other specified counseling: Secondary | ICD-10-CM | POA: Diagnosis not present

## 2020-07-30 DIAGNOSIS — Z79899 Other long term (current) drug therapy: Secondary | ICD-10-CM

## 2020-07-30 DIAGNOSIS — K449 Diaphragmatic hernia without obstruction or gangrene: Secondary | ICD-10-CM | POA: Diagnosis present

## 2020-07-30 LAB — COMPREHENSIVE METABOLIC PANEL
ALT: 17 U/L (ref 0–44)
AST: 26 U/L (ref 15–41)
Albumin: 3.2 g/dL — ABNORMAL LOW (ref 3.5–5.0)
Alkaline Phosphatase: 43 U/L (ref 38–126)
Anion gap: 8 (ref 5–15)
BUN: 6 mg/dL — ABNORMAL LOW (ref 8–23)
CO2: 34 mmol/L — ABNORMAL HIGH (ref 22–32)
Calcium: 8.9 mg/dL (ref 8.9–10.3)
Chloride: 84 mmol/L — ABNORMAL LOW (ref 98–111)
Creatinine, Ser: 0.42 mg/dL — ABNORMAL LOW (ref 0.44–1.00)
GFR, Estimated: >60 mL/min (ref >60)
Glucose, Bld: 119 mg/dL — ABNORMAL HIGH (ref 70–99)
Potassium: 3.8 mmol/L (ref 3.5–5.1)
Sodium: 126 mmol/L — ABNORMAL LOW (ref 135–145)
Total Bilirubin: 0.7 mg/dL (ref 0.3–1.2)
Total Protein: 6 g/dL — ABNORMAL LOW (ref 6.5–8.1)

## 2020-07-30 LAB — CBC WITH DIFFERENTIAL/PLATELET
Abs Immature Granulocytes: 0.24 10*3/uL — ABNORMAL HIGH (ref 0.00–0.07)
Basophils Absolute: 0.1 10*3/uL (ref 0.0–0.1)
Basophils Relative: 1 %
Eosinophils Absolute: 0.4 10*3/uL (ref 0.0–0.5)
Eosinophils Relative: 2 %
HCT: 31.9 % — ABNORMAL LOW (ref 36.0–46.0)
Hemoglobin: 11.3 g/dL — ABNORMAL LOW (ref 12.0–15.0)
Immature Granulocytes: 1 %
Lymphocytes Relative: 8 %
Lymphs Abs: 1.4 10*3/uL (ref 0.7–4.0)
MCH: 34 pg (ref 26.0–34.0)
MCHC: 35.4 g/dL (ref 30.0–36.0)
MCV: 96.1 fL (ref 80.0–100.0)
Monocytes Absolute: 1.4 10*3/uL — ABNORMAL HIGH (ref 0.1–1.0)
Monocytes Relative: 8 %
Neutro Abs: 14.2 10*3/uL — ABNORMAL HIGH (ref 1.7–7.7)
Neutrophils Relative %: 80 %
Platelets: 309 10*3/uL (ref 150–400)
RBC: 3.32 MIL/uL — ABNORMAL LOW (ref 3.87–5.11)
RDW: 12.4 % (ref 11.5–15.5)
WBC: 17.6 10*3/uL — ABNORMAL HIGH (ref 4.0–10.5)
nRBC: 0 % (ref 0.0–0.2)

## 2020-07-30 LAB — LACTIC ACID, PLASMA: Lactic Acid, Venous: 1 mmol/L (ref 0.5–1.9)

## 2020-07-30 LAB — URINALYSIS, COMPLETE (UACMP) WITH MICROSCOPIC
Bilirubin Urine: NEGATIVE
Glucose, UA: NEGATIVE mg/dL
Hgb urine dipstick: NEGATIVE
Ketones, ur: NEGATIVE mg/dL
Leukocytes,Ua: NEGATIVE
Nitrite: NEGATIVE
Protein, ur: NEGATIVE mg/dL
Specific Gravity, Urine: 1.01 (ref 1.005–1.030)
pH: 8 (ref 5.0–8.0)

## 2020-07-30 LAB — PROTIME-INR
INR: 1.4 — ABNORMAL HIGH (ref 0.8–1.2)
Prothrombin Time: 17 seconds — ABNORMAL HIGH (ref 11.4–15.2)

## 2020-07-30 MED ORDER — SENNA 8.6 MG PO TABS
1.0000 | ORAL_TABLET | Freq: Every day | ORAL | Status: DC | PRN
  Filled 2020-07-30: qty 2

## 2020-07-30 MED ORDER — ACETAMINOPHEN 325 MG PO TABS
650.0000 mg | ORAL_TABLET | Freq: Four times a day (QID) | ORAL | Status: DC | PRN
  Administered 2020-07-31 – 2020-08-06 (×5): 650 mg via ORAL
  Filled 2020-07-30 (×6): qty 2

## 2020-07-30 MED ORDER — POLYETHYLENE GLYCOL 3350 17 G PO PACK: 17.0000 g | PACK | Freq: Every day | ORAL | Status: DC | PRN

## 2020-07-30 MED ORDER — GABAPENTIN 300 MG PO CAPS
300.0000 mg | ORAL_CAPSULE | Freq: Four times a day (QID) | ORAL | Status: DC
  Administered 2020-07-31: 300 mg via ORAL
  Filled 2020-07-30: qty 1

## 2020-07-30 MED ORDER — METOPROLOL SUCCINATE ER 50 MG PO TB24: 25.0000 mg | ORAL_TABLET | Freq: Every day | ORAL | Status: DC

## 2020-07-30 MED ORDER — AMLODIPINE BESYLATE 5 MG PO TABS
2.5000 mg | ORAL_TABLET | Freq: Every day | ORAL | Status: DC
  Administered 2020-07-31: 2.5 mg via ORAL
  Filled 2020-07-30: qty 1

## 2020-07-30 MED ORDER — VANCOMYCIN HCL IN DEXTROSE 1-5 GM/200ML-% IV SOLN
1000.0000 mg | Freq: Once | INTRAVENOUS | Status: AC
  Administered 2020-07-30: 1000 mg via INTRAVENOUS
  Filled 2020-07-30: qty 200

## 2020-07-30 MED ORDER — PREDNISONE 10 MG PO TABS
5.0000 mg | ORAL_TABLET | Freq: Every day | ORAL | Status: DC
  Administered 2020-07-31 – 2020-08-01 (×2): 5 mg via ORAL
  Filled 2020-07-30 (×2): qty 1

## 2020-07-30 MED ORDER — IPRATROPIUM-ALBUTEROL 20-100 MCG/ACT IN AERS: 1.0000 | INHALATION_SPRAY | Freq: Four times a day (QID) | RESPIRATORY_TRACT | Status: DC | PRN

## 2020-07-30 MED ORDER — APIXABAN 5 MG PO TABS
5.0000 mg | ORAL_TABLET | Freq: Two times a day (BID) | ORAL | Status: DC
  Administered 2020-07-31 – 2020-08-01 (×4): 5 mg via ORAL
  Filled 2020-07-30 (×4): qty 1

## 2020-07-30 MED ORDER — ONDANSETRON HCL 4 MG/2ML IJ SOLN
4.0000 mg | Freq: Four times a day (QID) | INTRAMUSCULAR | Status: DC | PRN
  Administered 2020-07-31: 4 mg via INTRAVENOUS
  Filled 2020-07-30: qty 2

## 2020-07-30 MED ORDER — LEVALBUTEROL HCL 0.63 MG/3ML IN NEBU
1.5000 mL | INHALATION_SOLUTION | Freq: Four times a day (QID) | RESPIRATORY_TRACT | Status: DC | PRN
  Administered 2020-07-31 (×2): 1.5 mL via RESPIRATORY_TRACT
  Filled 2020-07-30 (×2): qty 3

## 2020-07-30 MED ORDER — TOBRAMYCIN 300 MG/5ML IN NEBU
300.0000 mg | INHALATION_SOLUTION | Freq: Two times a day (BID) | RESPIRATORY_TRACT | Status: DC
  Administered 2020-07-31: 300 mg via RESPIRATORY_TRACT
  Filled 2020-07-30 (×2): qty 5

## 2020-07-30 MED ORDER — SODIUM CHLORIDE 0.9 % IV SOLN
500.0000 mg | INTRAVENOUS | Status: DC
  Administered 2020-07-31: 500 mg via INTRAVENOUS
  Filled 2020-07-30: qty 500

## 2020-07-30 MED ORDER — FERROUS SULFATE 325 (65 FE) MG PO TABS: 325.0000 mg | ORAL_TABLET | Freq: Every day | ORAL | Status: DC

## 2020-07-30 MED ORDER — ACETYLCYSTEINE 20 % IN SOLN
600.0000 mg | Freq: Every day | RESPIRATORY_TRACT | Status: DC
  Administered 2020-07-31: 600 mg via ORAL
  Filled 2020-07-30 (×2): qty 4

## 2020-07-30 MED ORDER — MAGNESIUM HYDROXIDE 400 MG/5ML PO SUSP
30.0000 mL | Freq: Every day | ORAL | Status: DC | PRN
  Administered 2020-08-05: 30 mL via ORAL
  Filled 2020-07-30: qty 30

## 2020-07-30 MED ORDER — SODIUM CHLORIDE 0.9 % IV SOLN
2.0000 g | INTRAVENOUS | Status: DC
  Administered 2020-07-31: 2 g via INTRAVENOUS
  Filled 2020-07-30: qty 20

## 2020-07-30 MED ORDER — ONDANSETRON HCL 4 MG PO TABS
4.0000 mg | ORAL_TABLET | Freq: Four times a day (QID) | ORAL | Status: DC | PRN
  Filled 2020-07-30: qty 1

## 2020-07-30 MED ORDER — GUAIFENESIN ER 600 MG PO TB12
600.0000 mg | ORAL_TABLET | Freq: Two times a day (BID) | ORAL | Status: DC
  Administered 2020-07-31 – 2020-08-07 (×16): 600 mg via ORAL
  Filled 2020-07-30 (×16): qty 1

## 2020-07-30 MED ORDER — SODIUM CHLORIDE 0.9 % IV SOLN
2.0000 g | Freq: Once | INTRAVENOUS | Status: AC
  Administered 2020-07-30: 2 g via INTRAVENOUS
  Filled 2020-07-30: qty 2

## 2020-07-30 MED ORDER — SODIUM CHLORIDE 0.9 % IV SOLN: INTRAVENOUS | Status: DC

## 2020-07-30 MED ORDER — ACETAMINOPHEN 650 MG RE SUPP: 650.0000 mg | Freq: Four times a day (QID) | RECTAL | Status: DC | PRN

## 2020-07-30 MED ORDER — LORAZEPAM 0.5 MG PO TABS
0.5000 mg | ORAL_TABLET | Freq: Every day | ORAL | Status: DC
  Administered 2020-07-31 – 2020-08-02 (×4): 0.5 mg via ORAL
  Filled 2020-07-30 (×4): qty 1

## 2020-07-30 MED ORDER — GLYCOPYRROLATE 1 MG PO TABS
1.0000 mg | ORAL_TABLET | Freq: Three times a day (TID) | ORAL | Status: DC
  Administered 2020-07-31: 1 mg via ORAL
  Filled 2020-07-30 (×2): qty 1

## 2020-07-30 MED ORDER — LOSARTAN POTASSIUM 50 MG PO TABS: 100.0000 mg | ORAL_TABLET | Freq: Every day | ORAL | Status: DC

## 2020-07-30 MED ORDER — RISAQUAD PO CAPS
1.0000 | ORAL_CAPSULE | Freq: Every day | ORAL | Status: DC
  Administered 2020-07-31 – 2020-08-07 (×8): 1 via ORAL
  Filled 2020-07-30 (×8): qty 1

## 2020-07-30 MED ORDER — IOHEXOL 350 MG/ML SOLN
75.0000 mL | Freq: Once | INTRAVENOUS | Status: AC | PRN
  Administered 2020-07-30: 75 mL via INTRAVENOUS

## 2020-07-30 MED ORDER — ACETYLCYSTEINE 20 % IN SOLN
4.0000 mL | Freq: Every day | RESPIRATORY_TRACT | Status: DC
  Filled 2020-07-30: qty 4

## 2020-07-30 MED ORDER — PANTOPRAZOLE SODIUM 40 MG PO TBEC: 40.0000 mg | DELAYED_RELEASE_TABLET | Freq: Every day | ORAL | Status: DC

## 2020-07-30 MED ORDER — SPIRONOLACTONE 25 MG PO TABS
25.0000 mg | ORAL_TABLET | Freq: Every day | ORAL | Status: DC
  Administered 2020-07-31 – 2020-08-04 (×5): 25 mg via ORAL
  Filled 2020-07-30 (×6): qty 1

## 2020-07-30 MED ORDER — TRAZODONE HCL 50 MG PO TABS
25.0000 mg | ORAL_TABLET | Freq: Every evening | ORAL | Status: DC | PRN
  Administered 2020-07-31 (×2): 25 mg via ORAL
  Filled 2020-07-30 (×2): qty 1

## 2020-07-30 MED ORDER — SODIUM CHLORIDE 0.9 % IV BOLUS (SEPSIS)
1000.0000 mL | Freq: Once | INTRAVENOUS | Status: AC
  Administered 2020-07-30: 1000 mL via INTRAVENOUS

## 2020-07-30 MED ORDER — DIGOXIN 125 MCG PO TABS
125.0000 ug | ORAL_TABLET | Freq: Every day | ORAL | Status: DC
  Filled 2020-07-30: qty 1

## 2020-07-30 MED ORDER — DILTIAZEM HCL ER COATED BEADS 180 MG PO CP24: 180.0000 mg | ORAL_CAPSULE | Freq: Every day | ORAL | Status: DC

## 2020-07-30 MED ORDER — POTASSIUM CHLORIDE CRYS ER 10 MEQ PO TBCR
10.0000 meq | EXTENDED_RELEASE_TABLET | Freq: Every day | ORAL | Status: DC
  Administered 2020-07-31 – 2020-08-03 (×4): 10 meq via ORAL
  Filled 2020-07-30 (×5): qty 1

## 2020-07-30 MED ORDER — BENZONATATE 100 MG PO CAPS: 200.0000 mg | ORAL_CAPSULE | Freq: Three times a day (TID) | ORAL | Status: DC | PRN

## 2020-07-30 MED ORDER — FUROSEMIDE 40 MG PO TABS: 20.0000 mg | ORAL_TABLET | Freq: Every day | ORAL | Status: DC | PRN

## 2020-07-30 MED ORDER — ASCORBIC ACID 500 MG PO TABS
1000.0000 mg | ORAL_TABLET | Freq: Every day | ORAL | Status: DC
  Administered 2020-07-31 – 2020-08-07 (×8): 1000 mg via ORAL
  Filled 2020-07-30 (×8): qty 2

## 2020-07-30 NOTE — H&P (Addendum)
[place]   PATIENT NAME: Ana Washington    MR#:  [id number]  DATE OF BIRTH:  Mar 03, 1945  DATE OF ADMISSION:  07/30/2020  PRIMARY CARE PHYSICIAN: [name], MD   Patient is coming from: Home  REQUESTING/REFERRING PHYSICIAN: Brenton Grills, MD  CHIEF COMPLAINT:   Chief Complaint  Patient presents with  . Shortness of Breath    HISTORY OF PRESENT ILLNESS:  Ana Washington is a 75 y.o. Caucasian female with medical history significant for coronary artery disease, essential hypertension, GERD, as well as history of bronchiectasis and chronic MAC, , chronic respiratory failure (on 2lpm via [place] at home), Takotsubo cardiomyopathy (2015 at [facility]), gastroesophageal reflux disease, hypertension, paroxysmal atrial fibrillation (on Eliquis), generalized anxiety disorder, diastolic congestive heart failure (Echo 03/2020 EF 50-55% with G1DD), with recent COVID 19 infection 16 days ago, who presented to the emergency room with acute onset of persistent cough since then with expectoration of yellowish green sputum and worsening dyspnea today as well as wheezing.  She has been feeling "very very weak".  She was admitted here recently for exacerbation of her bronchiectasis from 6/14-6/20.  She was discharged home on p.o. Levaquin and prednisone.  She finished a course of IV remdesivir when she was here.  No chest pain or palpitations.  No nausea or vomiting or abdominal pain.  She admitted to tactile low-grade fever and chills today.  No dysuria, oliguria or hematuria or flank pain.  ED Course: Upon presentation to the emergency room respiratory rate was 30 with otherwise normal vital signs.  Later heart rate was 99.  Pulse oximetry was 9800% on 1 L of O2 by nasal cannula.  Labs revealed hyponatremia 126 with a hypokalemia of 84 and CO2 of 34 with albumin of 3.2 and total protein of 6.  CBC showed leukocytosis 17.6 with neutrophilia and anemia.  INR was 1.4.  Urinalysis was unremarkable.  Blood  cultures were drawn. EKG as reviewed by me : Showed atrial fibrillation with rapid ventricular sponsor of 104 with T wave inversion laterally.  Imaging: Portable chest x-ray showed: Diffuse reticulonodular opacities throughout the lungs, can be seen in the setting as atypical mycobacterial infection as suggested previously versus additional acute superimposed infection or inflammation.   Large left diaphragmatic eventration.   Chronic scarring and architectural distortion with areas of postsurgical changes in the left hilum and right upper lung.   Aortic Atherosclerosis and multilevel vertebroplasty.  Chest CTA revealed showed: No pulmonary embolism.   Mild coronary artery calcification. Morphologic changes in keeping with pulmonary arterial hypertension.   Status post partial right upper lobe, right middle lobe, and left lower lobectomy.   Extensive scattered areas of cylindrical and varicoid bronchiectasis with mild tree-in-bud nodularity stable since prior examination. Differential considerations include the sequela of remote or recurrent infection, aspiration, and atypical infection as can be seen with mycobacterial or atypical fungal infection.   Mosaic attenuation of the pulmonary parenchyma in keeping with air trapping secondary to small airways disease.   Aortic Atherosclerosis.  The patient was given IV cefepime and vancomycin as well as 1 L bolus of IV normal saline.  She will be admitted to a medical monitored bed for further evaluation and management. PAST MEDICAL HISTORY:   Past Medical History:  Diagnosis Date  . Arthritis   . Atrial fibrillation ([place])   . Bronchiectasis ([place])   . CAD (coronary artery disease) 07/30/2017  . Dumping syndrome   . 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  Family history of adverse reaction to anesthesia    sister [name]  . GERD (gastroesophageal reflux disease)   . Headache    MIGRAINES  . Myocardial infarction ([place])  2007   Non-STEMI  . [name] (postoperative nausea and vomiting)   . Psoriasis   . PUD (peptic ulcer disease)     PAST SURGICAL HISTORY:   Past Surgical History:  Procedure Laterality Date  . BACK SURGERY    . CHOLECYSTECTOMY    . ESOPHAGOGASTRODUODENOSCOPY (EGD) WITH PROPOFOL N/A 03/19/2019   Procedure: ESOPHAGOGASTRODUODENOSCOPY (EGD) WITH PROPOFOL;  Surgeon: [name], MD;  Location: [facility] ENDOSCOPY;  Service: Gastroenterology;  Laterality: N/A;  *Note to anesthesia: Per pt's pulmonologist, if intubating, please extubate to BIPAP.  [name] EYE SURGERY    . FOOT SURGERY    . INTRAMEDULLARY (IM) NAIL INTERTROCHANTERIC Right 09/30/2018   Procedure: INTRAMEDULLARY (IM) NAIL INTERTROCHANTRIC;  Surgeon: Dereck Leep, MD;  Location: [facility] ORS;  Service: Orthopedics;  Laterality: Right;  . KYPHOPLASTY N/A 07/05/2016   Procedure: KYPHOPLASTY T - 9;  Surgeon: Hessie Knows, MD;  Location: [facility] ORS;  Service: Orthopedics;  Laterality: N/A;  . KYPHOPLASTY N/A 11/29/2017   Procedure: Iona Hansen;  Surgeon: Hessie Knows, MD;  Location: [facility] ORS;  Service: Orthopedics;  Laterality: N/A;  L2 and L3  . KYPHOPLASTY N/A 12/18/2017   Procedure: KYPHOPLASTY L1;  Surgeon: Hessie Knows, MD;  Location: [facility] ORS;  Service: Orthopedics;  Laterality: N/A;  . KYPHOPLASTY N/A 01/05/2018   Procedure: KYPHOPLASTY-T11,T12;  Surgeon: Hessie Knows, MD;  Location: [facility] ORS;  Service: Orthopedics;  Laterality: N/A;  . KYPHOPLASTY N/A 04/05/2018   Procedure: T10 KYPHOPLASTY;  Surgeon: Hessie Knows, MD;  Location: [facility] ORS;  Service: Orthopedics;  Laterality: N/A;  . KYPHOPLASTY N/A 04/12/2018   Procedure: KYPHOPLASTY T7,8;  Surgeon: Hessie Knows, MD;  Location: [facility] ORS;  Service: Orthopedics;  Laterality: N/A;  . KYPHOPLASTY N/A 04/19/2018   Procedure: KYPHOPLASTY T5, T6;  Surgeon: Hessie Knows, MD;  Location: [facility] ORS;  Service: Orthopedics;  Laterality: N/A;  . LUNG SURGERY  1990 and 1996  . THOROCOTOMY WITH  LOBECTOMY     LEFT LOWER THORACOTOMY / RIGHT MIDDLE LOBECTOMY    SOCIAL HISTORY:   Social History   Tobacco Use  . Smoking status: Never  . Smokeless tobacco: Never  Substance Use Topics  . Alcohol use: No    FAMILY HISTORY:   Family History  Problem Relation Age of Onset  . Hypertension Mother   . Hypertension Father     DRUG ALLERGIES:   Allergies  Allergen Reactions  . Codeine Nausea And Vomiting  . Sulfa Antibiotics Diarrhea  . Penicillins Rash    Has patient had a PCN reaction causing immediate rash, facial/tongue/throat swelling, SOB or lightheadedness with hypotension: Unknown Has patient had a PCN reaction causing severe rash involving mucus membranes or skin necrosis: Unknown Has patient had a PCN reaction that required hospitalization: Unknown Has patient had a PCN reaction occurring within the last 10 years: No If all of the above answers are "NO", then may proceed with Cephalosporin use.     REVIEW OF SYSTEMS:   ROS As per history of present illness. All pertinent systems were reviewed above. Constitutional, HEENT, cardiovascular, respiratory, GI, GU, musculoskeletal, neuro, psychiatric, endocrine, integumentary and hematologic systems were reviewed and are otherwise negative/unremarkable except for positive findings mentioned above in the HPI.   MEDICATIONS AT HOME:   Prior to Admission medications   Medication Sig Start Date End Date Taking? Authorizing  Provider  acetaminophen (TYLENOL) 500 MG tablet Take 1,000 mg by mouth every 6 (six) hours as needed for mild pain or headache.     [provider]  acetylcysteine (MUCOMYST) 20 % nebulizer solution Take 4 mLs by nebulization daily. 04/22/20   [provider]  amLODipine (NORVASC) 2.5 MG tablet Take 2.5 mg by mouth daily. 05/11/20   [provider]  apixaban (ELIQUIS) 5 MG TABS tablet Take 5 mg by mouth 2 (two) times daily.     [provider]  Ascorbic Acid (VITAMIN  C) 1000 MG tablet Take 1,000 mg by mouth daily.    [provider]  benzonatate (TESSALON) 200 MG capsule Take 200 mg by mouth 3 (three) times daily as needed. 04/02/20   [provider]  COMBIVENT RESPIMAT 20-100 MCG/ACT AERS respimat 1 puff every 6 (six) hours as needed for wheezing. 06/09/20   [provider]  digoxin (LANOXIN) 0.125 MG tablet Take 125 mcg by mouth daily. 05/11/20   [provider]  diltiazem (CARDIZEM CD) 180 MG 24 hr capsule Take 180 mg by mouth daily. 06/21/20   [provider]  ferrous sulfate 325 (65 FE) MG EC tablet Take 325 mg by mouth daily.     [provider]  furosemide (LASIX) 20 MG tablet Take 20 mg by mouth daily as needed for edema. 07/08/19   [provider]  gabapentin (NEURONTIN) 300 MG capsule Take 300 mg by mouth 4 (four) times daily.     [provider]  glycopyrrolate (ROBINUL) 1 MG tablet Take 1 tablet by mouth 3 (three) times daily. 01/06/20 01/05/21  [provider]  levalbuterol Penne Lash) 0.31 MG/3ML nebulizer solution Inhale 3 mLs into the lungs every 6 (six) hours as needed. 12/10/19   [provider]  LORazepam (ATIVAN) 0.5 MG tablet Take 1 tablet (0.5 mg total) by mouth at bedtime. 09/10/19   Enzo Bi, MD  losartan (COZAAR) 100 MG tablet Take 100 mg by mouth daily. 07/25/19   [provider]  metoprolol succinate (TOPROL-XL) 100 MG 24 hr tablet Take 1 tablet (100 mg total) by mouth daily. 10/11/18   Hillary Bow, MD  NAC 600 MG CAPS Take 600 mg by mouth daily after breakfast.  08/17/19   [provider]  omeprazole (PRILOSEC) 20 MG capsule Take 20 mg by mouth daily. 08/26/19   [provider]  polyethylene glycol (MIRALAX / GLYCOLAX) 17 g packet Take 17 g by mouth daily as needed for severe constipation. 05/02/20   Loletha Grayer, MD  potassium chloride (K-DUR) 10 MEQ tablet Take 10 mEq by mouth daily.    [provider]  predniSONE  (DELTASONE) 10 MG tablet Take 10 mg by mouth daily. 07/17/20   [provider]  Probiotic Product (ALIGN) 4 MG CAPS Take 4 mg by mouth daily.    [provider]  senna (SENOKOT) 8.6 MG TABS tablet Take 2 tablets (17.2 mg total) by mouth at bedtime. 05/02/20   Loletha Grayer, MD  spironolactone (ALDACTONE) 25 MG tablet Take 25 mg by mouth daily. 07/24/20   [provider]  tobramycin, PF, (TOBI) 300 MG/5ML nebulizer solution Take 5 mLs by nebulization 2 (two) times daily. 01/10/20   [provider]      VITAL SIGNS:  Blood pressure 109/76, pulse 71, temperature 99.7 F (37.6 C), temperature source Oral, resp. rate 19, height _0  (1.651 m), weight 49.9 kg, SpO2 100 %.  PHYSICAL EXAMINATION:  Physical Exam  GENERAL:  75 y.o.-year-old patient lying in the bed with mild respiratory distress with conversational dyspnea.[name]  EYES: Pupils equal, round, reactive to light and accommodation. No scleral icterus. Extraocular muscles intact.  HEENT: Head atraumatic, normocephalic. Oropharynx and nasopharynx clear.  NECK:  Supple, no jugular venous distention. No thyroid enlargement, no tenderness.  LUNGS: Diminished bibasilar breath sounds with bibasal and midlung zone crackles. CARDIOVASCULAR: Regular rate and rhythm, S1, S2 normal. No murmurs, rubs, or gallops.  ABDOMEN: Soft, nondistended, nontender. Bowel sounds present. No organomegaly or mass.  EXTREMITIES: No pedal edema, cyanosis, or clubbing.  NEUROLOGIC: Cranial nerves II through XII are intact. Muscle strength 5/5 in all extremities. Sensation intact. Gait not checked.  PSYCHIATRIC: The patient is alert and oriented x 3.  Normal affect and good eye contact. SKIN: No obvious rash, lesion, or ulcer.   LABORATORY PANEL:   CBC Recent Labs  Lab 07/30/20 2035  WBC 17.6*  HGB 11.3*  HCT 31.9*  PLT 309    ------------------------------------------------------------------------------------------------------------------  Chemistries  Recent Labs  Lab 07/30/20 2035  NA 126*  K 3.8  CL 84*  CO2 34*  GLUCOSE 119*  BUN 6*  CREATININE 0.42*  CALCIUM 8.9  AST 26  ALT 17  ALKPHOS 43  BILITOT 0.7   ------------------------------------------------------------------------------------------------------------------  Cardiac Enzymes No results for input(s): TROPONINI in the last 168 hours. ------------------------------------------------------------------------------------------------------------------  RADIOLOGY:  DG Chest 1 View  Result Date: 07/30/2020 CLINICAL DATA:  COVID diagnosis 5 weeks prior, known metastatic disease, increasing shortness of breath with productive cough EXAM: CHEST  1 VIEW COMPARISON:  Radiograph 07/14/2020, CT 07/14/2020 FINDINGS: Large left diaphragmatic eventration. Some adjacent passive atelectatic changes. Diffuse reticulonodular opacities present throughout both lungs. Postsurgical changes in the left hilum and right upper lung are stable from priors and likely reflecting multiple resections. No pneumothorax. No visible layering effusion. Stable cardiomediastinal contours with a calcified aorta. Multilevel vertebroplasty changes are seen. No acute osseous abnormality or suspicious osseous lesion. The osseous structures appear diffusely demineralized which may limit detection of small or nondisplaced fractures. Degenerative changes in the shoulders, right greater than left with high-riding humeral head suggesting underlying rotator cuff insufficiency. Telemetry leads overlie the chest. IMPRESSION: Diffuse reticulonodular opacities throughout the lungs, can be seen in the setting as atypical mycobacterial infection as suggested previously versus additional acute superimposed infection or inflammation. Large left diaphragmatic eventration. Chronic scarring and  architectural distortion with areas of postsurgical changes in the left hilum and right upper lung. Aortic Atherosclerosis (ICD10-I70.0). Multilevel vertebroplasty. Electronically Signed   By: Lovena Le M.D.   On: 07/30/2020 20:34   CT Angio Chest PE W and/or Wo Contrast  Result Date: 07/30/2020 CLINICAL DATA:  Subacute COVID pneumonia, cough EXAM: CT ANGIOGRAPHY CHEST WITH CONTRAST TECHNIQUE: Multidetector CT imaging of the chest was performed using the standard protocol during bolus administration of intravenous contrast. Multiplanar CT image reconstructions and MIPs were obtained to evaluate the vascular anatomy. CONTRAST:  61m OMNIPAQUE IOHEXOL 350 MG/ML SOLN COMPARISON:  07/14/2020 FINDINGS: Cardiovascular: There is adequate opacification of the residual pulmonary arterial tree. No intraluminal filling defect to suggest acute pulmonary embolism. The central pulmonary arteries are enlarged in keeping with changes of pulmonary arterial hypertension, unchanged from prior examination. Mild multi-vessel coronary artery calcification. Global cardiac size within normal limits. No pericardial effusion. Moderate atherosclerotic calcification within the abdominal aorta. No aortic aneurysm. Mediastinum/Nodes: No pathologic thoracic adenopathy. The visualized thyroid is unremarkable. Small amount of debris is seen within the esophagus possibly reflecting changes of esophageal dysmotility or  gastroesophageal reflux. A large hiatal hernia is seen extending into the left hemithorax with herniation of the splenic flexure of the colon and nearly the entire stomach into the left hemithorax. There is marked tortuosity of the thoracic aorta which extends into the posterior left hemithorax as result of the a large hernia. Lungs/Pleura: Status post partial right upper lobectomy and right middle lobectomy as well as left lower lobectomy. Mosaic attenuation pattern of the lungs is again identified in keeping with air trapping  secondary to probable small airways disease. There is superimposed areas of cylindrical and varicoid bronchiectasis again identified, more focal within the residual right upper lobe and basilar right lower lobe though chronic infection with atypical organisms such as MAI could result in such an appearance, an additional consideration should include the sequela of remote or recurrent infection and/or aspiration. There is these changes appears stable since prior examination. No new focal pulmonary nodules or infiltrates. No pneumothorax or pleural effusion. Upper Abdomen: Status post cholecystectomy.  No acute abnormality. Musculoskeletal: Multilevel thoracolumbar vertebroplasty has been performed of each visualized level inferior to T4. Stable remote compression fracture of T3. No acute bone abnormality. Review of the MIP images confirms the above findings. IMPRESSION: No pulmonary embolism. Mild coronary artery calcification. Morphologic changes in keeping with pulmonary arterial hypertension. Status post partial right upper lobe, right middle lobe, and left lower lobectomy. Extensive scattered areas of cylindrical and varicoid bronchiectasis with mild tree-in-bud nodularity stable since prior examination. Differential considerations include the sequela of remote or recurrent infection, aspiration, and atypical infection as can be seen with mycobacterial or atypical fungal infection. Mosaic attenuation of the pulmonary parenchyma in keeping with air trapping secondary to small airways disease. Aortic Atherosclerosis (ICD10-I70.0). Electronically Signed   By: Fidela Salisbury MD   On: 07/30/2020 22:16      IMPRESSION AND PLAN:  Active Problems:   CAP (community acquired pneumonia)  1.  Bronchiectasis exacerbation like secondary to lower respiratory infection possibly community-acquired pneumonia in the setting of chronic MAC.  The patient has subsequent mild sepsis manifested by leukocytosis, tachycardia and  tachypnea. - The patient will be admitted to a medical telemetry monitored bed. - We will continue antibiotic therapy with IV Rocephin and Zithromax. - Mucolytic therapy will be provided.  We will continue Robinul and Mucomyst. - Scheduled and as needed bronchodilator nebulizer therapy will be given. - We will follow blood, sputum cultures as well as urine pneumonia antigens. - The patient will be hydrated with IV normal saline. - We will follow lactic acid level.  2.  Atrial fibrillation with rapid ventricular response. - We will continue Eliquis as well as digoxin and Toprol-XL as well as Cardizem CD.  3.  Essential hypertension. - We will continue her Cozaar and Cardizem CD.  4.  GERD. - We will continue PPI therapy.  5.  Anxiety. - We will continue Ativan.  DVT prophylaxis: We will continue Eliquis. Code Status: full code. Family Communication:  The plan of care was discussed in details with the patient (and family). I answered all questions. The patient agreed to proceed with the above mentioned plan. Further management will depend upon hospital course. Disposition Plan: Back to previous home environment Consults called: none. All the records are reviewed and case discussed with ED provider.  Status is: Inpatient  Remains inpatient appropriate because:Ongoing diagnostic testing needed not appropriate for outpatient work up, Unsafe d/c plan, IV treatments appropriate due to intensity of illness or inability to take PO, and Inpatient  level of care appropriate due to severity of illness  Dispo: The patient is from: Home              Anticipated d/c is to: Home              Patient currently is not medically stable to d/c.   Difficult to place patient No  TOTAL TIME TAKING CARE OF THIS PATIENT: 55 minutes.    Christel Mormon M.D on 07/30/2020 at 11:12 PM  [facility]   From 7 PM-7 AM, contact night-coverage [email or web address]  CC: Primary care physician; [name],  MD

## 2020-07-30 NOTE — Consult Note (Signed)
PHARMACY -  BRIEF ANTIBIOTIC NOTE   Pharmacy has received consult(s) for vancomycin and cefepime from an ED provider.  The patient's profile has been reviewed for ht/wt/allergies/indication/available labs.    One time order(s) placed for  --Vancomycin 1 g IV --Cefepime 2 g IV  Further antibiotics/pharmacy consults should be ordered by admitting physician if indicated.                       Thank you, Benita Gutter 07/30/2020  9:03 PM

## 2020-07-30 NOTE — ED Notes (Signed)
Patient transported to CT 

## 2020-07-30 NOTE — ED Notes (Signed)
ED Provider at bedside. 

## 2020-07-30 NOTE — ED Triage Notes (Signed)
Pt with covid 5 weeks ago, pt states she has been coughing since. Pt has hx of MET dz. Pt has been coughing yellow sputum and more SOB today. Pt from home.

## 2020-07-30 NOTE — ED Provider Notes (Signed)
[facility] Emergency Department Provider Note  ____________________________________________  Time seen: Approximately 11:38 PM  I have reviewed the triage vital signs and the nursing notes.   HISTORY  Chief Complaint Shortness of Breath    HPI Ana Washington is a 75 y.o. 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  Patient has chronically been on azithromycin.  Recently she also was given a course of Levaquin and started to feel improved on this.  However, a few days ago the Levaquin course finished and since then she has felt like she is declining.  Had a goals of care discussion briefly with the patient and her daughter at bedside.  She is DNR.  Additionally, if her condition is felt to be end-stage, patient and family are clear that she wishes to die at home and not in the hospital.    Past Medical History:  Diagnosis Date   Arthritis    Atrial fibrillation ([place])    Bronchiectasis ([place])    CAD (coronary artery disease) 07/30/2017   Dumping syndrome    Essential hypertension, malignant 10/03/2013   Family history of adverse reaction to anesthesia    sister [name]   GERD (gastroesophageal reflux disease)    Headache    MIGRAINES   Myocardial infarction ([place]) 2007   Non-STEMI   [name] (postoperative nausea and vomiting)    Psoriasis    PUD (peptic ulcer disease)      Patient Active Problem List   Diagnosis Date Noted   CAP (community acquired pneumonia) 07/30/2020   Acute exacerbation of bronchiectasis ([place]) 07/14/2020   COVID-19 virus infection 07/14/2020   Lactic acidosis 07/14/2020   SIRS (systemic inflammatory response syndrome) ([place]) 07/14/2020   Elevated troponin level not due myocardial infarction 07/14/2020   Anxiety     Constipation    Pressure injury of right heel, stage 1    Bronchiectasis with (acute) exacerbation ([place]) 04/29/2020   Hemoptysis    Chronic respiratory failure with hypoxia ([place])    AKI (acute kidney injury) ([place])    Bronchiectasis ([place]) 09/08/2019   Atrial fibrillation, chronic ([place]) 09/08/2019   Closed right hip fracture (HCC) 09/27/2018   Intractable nausea and vomiting 04/08/2018   Atrial fibrillation with RVR ([place]) 04/08/2018   Thoracic radiculitis (Bilateral) 03/29/2018   Vasovagal episode 03/29/2018   Closed compression fracture of T10 thoracic vertebra, sequela 03/29/2018   Abnormal MRI, lumbar spine (12/15/2016) 03/21/2018   Lumbar compression fractures, sequela (L1, L2, L3, L4, and L5) 03/21/2018   Closed compression fracture of L1 lumbar vertebra, sequela 03/21/2018   Closed compression fracture of L2 lumbar vertebra, sequela 03/21/2018   Closed compression fracture of L3 lumbar vertebra, sequela 03/21/2018   Closed compression fracture of L4 lumbar vertebra, sequela 03/21/2018   Closed compression fracture of L5 lumbar vertebra, sequela 03/21/2018   Thoracic compression fracture, sequela (T5, T9, T10, T11, and T12) 03/21/2018   Closed compression fracture of T5 thoracic vertebra, sequela 03/21/2018   Closed compression fracture of T9 thoracic vertebra, sequela 03/21/2018   Close compression fracture of T11 thoracic vertebra, sequela 03/21/2018   Closed compression fracture of T12 thoracic vertebra, sequela 03/21/2018   Lumbar facet hypertrophy 03/21/2018   Grade 1  Lumbar Anterolisthesis of L3/4 and L4/5 03/21/2018   Lumbar central spinal stenosis (  Multilevel), 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 claudication 03/21/2018   Chronic anticoagulation (ELIQUIS) 03/21/2018   History of pelvic fracture 03/21/2018   Chronic musculoskeletal pain 03/21/2018   Neurogenic pain 03/21/2018   Long term prescription benzodiazepine use 03/21/2018   DDD (degenerative disc disease), 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    Adult bronchiectasis ([place]) 03/01/2018   Diverticulitis 03/01/2018   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[place]) 03/01/2018   Migraines 03/01/2018   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   Osteoporosis, 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   Psoriasis 03/01/2018   Chronic upper back pain (Primary Area of Pain) (Bilateral) (R>L) 03/01/2018   Chronic low back pain (Secondary Area of Pain) (Bilateral) (R>L) w/o sciatica 03/01/2018   Chronic pain syndrome 03/01/2018   Long term current use of opiate analgesic 03/01/2018   Pharmacologic therapy 03/01/2018   Disorder of skeletal system 03/01/2018   Problems influencing health status 03/01/2018   History of kyphoplasty (L1, L2, L3, T9, T11, and T12) 01/05/2018   Malnutrition of moderate degree ([place]) 08/03/2017   GERD (gastroesophageal reflux disease) 07/30/2017   Pelvic fracture ([place]) 07/30/2017   AF (paroxysmal atrial fibrillation) ([place]) 07/30/2017   Other dysphagia 09/13/2016   Unintended weight loss 09/13/2016   Essential hypertension 10/03/2013   Osteoarthritis of knees (Bilateral) 10/03/2013   Primary localized osteoarthrosis, lower leg 10/03/2013   Non-ischemic cardiomyopathy ([place]) 09/12/2013   Coronary artery disease involving native coronary artery of native heart without angina pectoris 07/28/2012   Hyperlipidemia, mixed 07/28/2012   DDD (degenerative disc disease), lumbar 02/29/2012     Past Surgical History:  Procedure Laterality Date   BACK SURGERY     CHOLECYSTECTOMY     ESOPHAGOGASTRODUODENOSCOPY (EGD) WITH PROPOFOL N/A 03/19/2019   Procedure: ESOPHAGOGASTRODUODENOSCOPY (EGD) WITH PROPOFOL;  Surgeon: [name], MD;  Location: [facility] ENDOSCOPY;  Service: Gastroenterology;  Laterality: N/A;  *Note to anesthesia: Per pt's pulmonologist, if intubating, please extubate to BIPAP.   EYE SURGERY     FOOT SURGERY     INTRAMEDULLARY (IM) NAIL INTERTROCHANTERIC Right 09/30/2018   Procedure: INTRAMEDULLARY (IM) NAIL INTERTROCHANTRIC;  Surgeon:  Dereck Leep, MD;  Location: [facility] ORS;  Service: Orthopedics;  Laterality: Right;   KYPHOPLASTY N/A 07/05/2016   Procedure: KYPHOPLASTY T - 9;  Surgeon: Hessie Knows, MD;  Location: [facility] ORS;  Service: Orthopedics;  Laterality: N/A;   KYPHOPLASTY N/A 11/29/2017   Procedure: Iona Hansen;  Surgeon: Hessie Knows, MD;  Location: [facility] ORS;  Service: Orthopedics;  Laterality: N/A;  L2 and L3   KYPHOPLASTY N/A 12/18/2017   Procedure: KYPHOPLASTY L1;  Surgeon: Hessie Knows, MD;  Location: [facility] ORS;  Service: Orthopedics;  Laterality: N/A;   KYPHOPLASTY N/A 01/05/2018   Procedure: KYPHOPLASTY-T11,T12;  Surgeon: Hessie Knows, MD;  Location: [facility] ORS;  Service: Orthopedics;  Laterality: N/A;   KYPHOPLASTY N/A 04/05/2018   Procedure: T10 KYPHOPLASTY;  Surgeon: Hessie Knows, MD;  Location: [facility] ORS;  Service: Orthopedics;  Laterality: N/A;   KYPHOPLASTY N/A 04/12/2018   Procedure: KYPHOPLASTY T7,8;  Surgeon: Hessie Knows, MD;  Location: [facility] ORS;  Service: Orthopedics;  Laterality: N/A;   KYPHOPLASTY N/A 04/19/2018   Procedure: KYPHOPLASTY T5, T6;  Surgeon: Hessie Knows, MD;  Location: [facility] ORS;  Service: Orthopedics;  Laterality: N/A;   LUNG SURGERY  1990 and 1996   THOROCOTOMY WITH LOBECTOMY     LEFT LOWER THORACOTOMY / RIGHT MIDDLE LOBECTOMY     Prior to Admission medications   Medication Sig Start Date End Date Taking? Authorizing Provider  acetaminophen (TYLENOL) 500 MG tablet Take 1,000 mg by mouth every 6 (six) hours  as needed for mild pain or headache.    Yes [provider]  acetylcysteine (MUCOMYST) 20 % nebulizer solution Take 4 mLs by nebulization daily. 04/22/20  Yes [provider]  amLODipine (NORVASC) 2.5 MG tablet Take 2.5 mg by mouth daily. 05/11/20  Yes [provider]  apixaban (ELIQUIS) 5 MG TABS tablet Take 5 mg by mouth 2 (two) times daily.    Yes [provider]  Ascorbic Acid (VITAMIN C) 1000 MG tablet Take 1,000 mg by mouth daily.    Yes [provider]  azithromycin (ZITHROMAX) 250 MG tablet Take 250 mg by mouth daily.   Yes [provider]  COMBIVENT RESPIMAT 20-100 MCG/ACT AERS respimat 1 puff every 6 (six) hours as needed for wheezing. 06/09/20  Yes [provider]  digoxin (LANOXIN) 0.125 MG tablet Take 125 mcg by mouth daily. 05/11/20  Yes [provider]  ferrous sulfate 325 (65 FE) MG EC tablet Take 325 mg by mouth daily.    Yes [provider]  furosemide (LASIX) 20 MG tablet Take 20 mg by mouth daily as needed for edema. 07/08/19  Yes [provider]  gabapentin (NEURONTIN) 300 MG capsule Take 300 mg by mouth 4 (four) times daily.    Yes [provider]  glycopyrrolate (ROBINUL) 1 MG tablet Take 1 tablet by mouth 3 (three) times daily. 01/06/20 01/05/21 Yes [provider]  levalbuterol (XOPENEX) 0.31 MG/3ML nebulizer solution Inhale 3 mLs into the lungs every 6 (six) hours as needed. 12/10/19  Yes [provider]  LORazepam (ATIVAN) 0.5 MG tablet Take 1 tablet (0.5 mg total) by mouth at bedtime. 09/10/19  Yes Enzo Bi, MD  metoprolol succinate (TOPROL-XL) 25 MG 24 hr tablet Take 25 mg by mouth at bedtime.   Yes [provider]  NAC 600 MG CAPS Take 600 mg by mouth daily after breakfast.  08/17/19  Yes [provider]  omeprazole (PRILOSEC) 20 MG capsule Take 20 mg by mouth daily. 08/26/19  Yes [provider]  polyethylene glycol (MIRALAX / GLYCOLAX) 17 g packet Take 17 g by mouth daily as needed for severe constipation. 05/02/20  Yes Wieting, Richard, MD  potassium chloride (K-DUR) 10 MEQ tablet Take 10 mEq by mouth daily.   Yes [provider]  Probiotic Product (ALIGN) 4 MG CAPS Take 4 mg by mouth daily.   Yes [provider]  senna (SENOKOT) 8.6 MG TABS tablet Take 2 tablets (17.2 mg total) by mouth at bedtime. 05/02/20  Yes Wieting, Richard, MD  spironolactone (ALDACTONE) 25 MG tablet Take 25 mg by  mouth daily. 07/24/20  Yes [provider]  tobramycin, PF, (TOBI) 300 MG/5ML nebulizer solution Take 5 mLs by nebulization 2 (two) times daily. 01/10/20  Yes [provider]  benzonatate (TESSALON) 200 MG capsule Take 200 mg by mouth 3 (three) times daily as needed. Patient not taking: Reported on 07/30/2020 04/02/20   [provider]  diltiazem (CARDIZEM CD) 180 MG 24 hr capsule Take 180 mg by mouth daily. Patient not taking: Reported on 07/30/2020 06/21/20   [provider]  losartan (COZAAR) 100 MG tablet Take 100 mg by mouth daily. Patient not taking: Reported on 07/30/2020 07/25/19   [provider]  metoprolol succinate (TOPROL-XL) 100 MG 24 hr tablet Take 1 tablet (100 mg total) by mouth daily. Patient not taking: Reported on 07/30/2020 10/11/18   Hillary Bow, MD  predniSONE (DELTASONE) 10 MG tablet Take 10 mg by mouth daily. Patient  not taking: Reported on 07/30/2020 07/17/20   [provider]     Allergies Codeine, Sulfa antibiotics, and Penicillins   Family History  Problem Relation Age of Onset   Hypertension Mother    Hypertension Father     Social History Social History   Tobacco Use   Smoking status: Never   Smokeless tobacco: Never  Vaping Use   Vaping Use: Never used  Substance Use Topics   Alcohol use: No   Drug use: No    Review of Systems  Constitutional:   No fever or chills.  ENT:   No sore throat. No rhinorrhea. Cardiovascular:   No chest pain or syncope. Respiratory:   Positive shortness of breath and cough. Gastrointestinal:   Negative for abdominal pain, vomiting and diarrhea.  Musculoskeletal:   Negative for focal pain or swelling All other systems reviewed and are negative except as documented above in ROS and HPI.  ____________________________________________   PHYSICAL EXAM:  VITAL SIGNS: ED Triage Vitals  Enc Vitals Group     BP 07/30/20 2001 117/76     Pulse Rate 07/30/20 2001 81      Resp 07/30/20 2001 (!) 23     Temp 07/30/20 2001 99.7 F (37.6 C)     Temp Source 07/30/20 2001 Oral     SpO2 07/30/20 2000 100 %     Weight 07/30/20 2003 110 lb (49.9 kg)     Height 07/30/20 2003 _0  (1.651 m)     Head Circumference --      Peak Flow --      Pain Score 07/30/20 2002 3     Pain Loc --      Pain Edu? --      Excl. in [place]? --     Vital signs reviewed, nursing assessments reviewed.   Constitutional:   Alert and oriented.  Chronically ill-appearing. Eyes:   Conjunctivae are normal. EOMI. PERRL. ENT      Head:   Normocephalic and atraumatic.      Nose:   Wearing a mask.      Mouth/Throat:   Wearing a mask.      Neck:   No meningismus. Full ROM. Hematological/Lymphatic/Immunilogical:   No cervical lymphadenopathy. Cardiovascular:   Atrial fibrillation, heart rate 120-130. Symmetric bilateral radial and DP pulses.  No murmurs. Cap refill less than 2 seconds. Respiratory:   Tachypnea, respiratory rate of 24.  Diffuse crackles.  No wheezing.  Grossly normal air entry bilaterally and symmetric. Gastrointestinal:   Soft and nontender. Non distended. There is no CVA tenderness.  No rebound, rigidity, or guarding. Genitourinary:   deferred Musculoskeletal:   Normal range of motion in all extremities. No joint effusions.  No lower extremity tenderness.  No edema. Neurologic:   Normal speech and language.  Motor grossly intact. No acute focal neurologic deficits are appreciated.  Skin:    Skin is warm, dry and intact. No rash noted.  No petechiae, purpura, or bullae.  ____________________________________________    LABS (pertinent positives/negatives) (all labs ordered are listed, but only abnormal results are displayed) Labs Reviewed  COMPREHENSIVE METABOLIC PANEL - Abnormal; Notable for the following components:      Result Value   Sodium 126 (*)    Chloride 84 (*)    CO2 34 (*)    Glucose, Bld 119 (*)    BUN 6 (*)    Creatinine, Ser 0.42 (*)    Total Protein  6.0 (*)    Albumin 3.2 (*)  All other components within normal limits  CBC WITH DIFFERENTIAL/PLATELET - Abnormal; Notable for the following components:   WBC 17.6 (*)    RBC 3.32 (*)    Hemoglobin 11.3 (*)    HCT 31.9 (*)    Neutro Abs 14.2 (*)    Monocytes Absolute 1.4 (*)    Abs Immature Granulocytes 0.24 (*)    All other components within normal limits  PROTIME-INR - Abnormal; Notable for the following components:   Prothrombin Time 17.0 (*)    INR 1.4 (*)    All other components within normal limits  URINALYSIS, COMPLETE (UACMP) WITH MICROSCOPIC - Abnormal; Notable for the following components:   Color, Urine YELLOW (*)    APPearance HAZY (*)    Bacteria, UA RARE (*)    All other components within normal limits  CULTURE, BLOOD (SINGLE)  CULTURE, BLOOD (SINGLE)  EXPECTORATED SPUTUM ASSESSMENT W GRAM STAIN, RFLX TO RESP C  CULTURE, BLOOD (ROUTINE X 2)  CULTURE, BLOOD (ROUTINE X 2)  LACTIC ACID, PLASMA  LACTIC ACID, PLASMA  BASIC METABOLIC PANEL  CBC  LEGIONELLA PNEUMOPHILA SEROGP 1 UR AG  STREP PNEUMONIAE URINARY ANTIGEN   ____________________________________________   EKG  Interpreted by me Atrial fibrillation, rate of 104.  Normal axis and intervals.  Poor R wave progression.  Normal ST segments and T waves.  No ischemic changes.  ____________________________________________    [id number]  DG Chest 1 View  Result Date: 07/30/2020 CLINICAL DATA:  COVID diagnosis 5 weeks prior, known metastatic disease, increasing shortness of breath with productive cough EXAM: CHEST  1 VIEW COMPARISON:  Radiograph 07/14/2020, CT 07/14/2020 FINDINGS: Large left diaphragmatic eventration. Some adjacent passive atelectatic changes. Diffuse reticulonodular opacities present throughout both lungs. Postsurgical changes in the left hilum and right upper lung are stable from priors and likely reflecting multiple resections. No pneumothorax. No visible layering effusion. Stable  cardiomediastinal contours with a calcified aorta. Multilevel vertebroplasty changes are seen. No acute osseous abnormality or suspicious osseous lesion. The osseous structures appear diffusely demineralized which may limit detection of small or nondisplaced fractures. Degenerative changes in the shoulders, right greater than left with high-riding humeral head suggesting underlying rotator cuff insufficiency. Telemetry leads overlie the chest. IMPRESSION: Diffuse reticulonodular opacities throughout the lungs, can be seen in the setting as atypical mycobacterial infection as suggested previously versus additional acute superimposed infection or inflammation. Large left diaphragmatic eventration. Chronic scarring and architectural distortion with areas of postsurgical changes in the left hilum and right upper lung. Aortic Atherosclerosis (ICD10-I70.0). Multilevel vertebroplasty. Electronically Signed   By: Lovena Le M.D.   On: 07/30/2020 20:34   CT Angio Chest PE W and/or Wo Contrast  Result Date: 07/30/2020 CLINICAL DATA:  Subacute COVID pneumonia, cough EXAM: CT ANGIOGRAPHY CHEST WITH CONTRAST TECHNIQUE: Multidetector CT imaging of the chest was performed using the standard protocol during bolus administration of intravenous contrast. Multiplanar CT image reconstructions and MIPs were obtained to evaluate the vascular anatomy. CONTRAST:  74m OMNIPAQUE IOHEXOL 350 MG/ML SOLN COMPARISON:  07/14/2020 FINDINGS: Cardiovascular: There is adequate opacification of the residual pulmonary arterial tree. No intraluminal filling defect to suggest acute pulmonary embolism. The central pulmonary arteries are enlarged in keeping with changes of pulmonary arterial hypertension, unchanged from prior examination. Mild multi-vessel coronary artery calcification. Global cardiac size within normal limits. No pericardial effusion. Moderate atherosclerotic calcification within the abdominal aorta. No aortic aneurysm.  Mediastinum/Nodes: No pathologic thoracic adenopathy. The visualized thyroid is unremarkable. Small amount of debris is seen within the  esophagus possibly reflecting changes of esophageal dysmotility or gastroesophageal reflux. A large hiatal hernia is seen extending into the left hemithorax with herniation of the splenic flexure of the colon and nearly the entire stomach into the left hemithorax. There is marked tortuosity of the thoracic aorta which extends into the posterior left hemithorax as result of the a large hernia. Lungs/Pleura: Status post partial right upper lobectomy and right middle lobectomy as well as left lower lobectomy. 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 There is superimposed areas of cylindrical and varicoid bronchiectasis again identified, more focal within the residual right upper lobe and basilar right lower lobe though chronic infection with atypical organisms such as MAI could result in such an appearance, an additional consideration should include the sequela of remote or recurrent infection and/or aspiration. There is these changes appears stable since prior examination. No new focal pulmonary nodules or infiltrates. No pneumothorax or pleural effusion. Upper Abdomen: Status post cholecystectomy.  No acute abnormality. Musculoskeletal: Multilevel thoracolumbar vertebroplasty has been performed of each visualized level inferior to T4. Stable remote compression fracture of T3. No acute bone abnormality. Review of the MIP images confirms the above findings. IMPRESSION: No pulmonary embolism. Mild coronary artery calcification. Morphologic changes in keeping with pulmonary arterial hypertension. Status post partial right upper lobe, right middle lobe, and left lower lobectomy. Extensive scattered areas of cylindrical and varicoid bronchiectasis with mild tree-in-bud nodularity stable since prior  examination. Differential considerations include the sequela of remote or recurrent infection, aspiration, and atypical infection as can be seen with mycobacterial or atypical fungal infection. Mosaic attenuation of the pulmonary parenchyma in keeping with air trapping secondary to small airways disease. Aortic Atherosclerosis (ICD10-I70.0). Electronically Signed   By: Fidela Salisbury MD   On: 07/30/2020 22:16    ____________________________________________   PROCEDURES .Critical Care  Date/Time: 07/30/2020 11:43 PM Performed by: Carrie Mew, MD Authorized by: Carrie Mew, MD   Critical care provider statement:    Critical care time (minutes):  35   Critical care time was exclusive of:  Separately billable procedures and treating other patients   Critical care was necessary to treat or prevent imminent or life-threatening deterioration of the following conditions:  Respiratory failure and sepsis   Critical care was time spent personally by me on the following activities:  Development of treatment plan with patient or surrogate, discussions with consultants, evaluation of patient's response to treatment, examination of patient, obtaining history from patient or surrogate, ordering and performing treatments and interventions, ordering and review of laboratory studies, ordering and review of radiographic studies, pulse oximetry, re-evaluation of patient's condition and review of old charts  ____________________________________________  DIFFERENTIAL DIAGNOSIS   Pneumonia, pleural effusion, pneumothorax, pulmonary embolism, non-STEMI  CLINICAL IMPRESSION / ASSESSMENT AND PLAN / ED COURSE  Medications ordered in the ED: Medications  vancomycin (VANCOCIN) IVPB 1000 mg/200 mL premix (1,000 mg Intravenous New Bag/Given 07/30/20 2250)  tobramycin (PF) (TOBI) nebulizer solution 300 mg (has no administration in time range)  amLODipine (NORVASC) tablet 2.5 mg (has no administration in time  range)  digoxin (LANOXIN) tablet 125 mcg (has no administration in time range)  diltiazem (CARDIZEM CD) 24 hr capsule 180 mg (has no administration in time range)  furosemide (LASIX) tablet 20 mg (has no administration in time range)  losartan (COZAAR) tablet 100 mg (has no administration in time range)  metoprolol succinate (TOPROL-XL) 24 hr tablet 100 mg (has no  administration in time range)  spironolactone (ALDACTONE) tablet 25 mg (has no administration in time range)  LORazepam (ATIVAN) tablet 0.5 mg (has no administration in time range)  predniSONE (DELTASONE) tablet 10 mg (has no administration in time range)  glycopyrrolate (ROBINUL) tablet 1 mg (has no administration in time range)  pantoprazole (PROTONIX) EC tablet 40 mg (has no administration in time range)  polyethylene glycol (MIRALAX / GLYCOLAX) packet 17 g (has no administration in time range)  Align CAPS 4 mg (has no administration in time range)  senna (SENOKOT) tablet 17.2 mg (has no administration in time range)  apixaban (ELIQUIS) tablet 5 mg (has no administration in time range)  ferrous sulfate EC tablet 325 mg (has no administration in time range)  gabapentin (NEURONTIN) capsule 300 mg (has no administration in time range)  ascorbic acid (VITAMIN C) tablet 1,000 mg (has no administration in time range)  Acetylcysteine CAPS 600 mg (has no administration in time range)  potassium chloride (KLOR-CON) CR tablet 10 mEq (has no administration in time range)  acetylcysteine (MUCOMYST) 20 % nebulizer / oral solution 4 mL (has no administration in time range)  benzonatate (TESSALON) capsule 200 mg (has no administration in time range)  Ipratropium-Albuterol (COMBIVENT) respimat 1 puff (has no administration in time range)  levalbuterol (XOPENEX) nebulizer solution 3 mL (has no administration in time range)  0.9 %  sodium chloride infusion (has no administration in time range)  acetaminophen (TYLENOL) tablet 650 mg (has no  administration in time range)    Or  acetaminophen (TYLENOL) suppository 650 mg (has no administration in time range)  traZODone (DESYREL) tablet 25 mg (has no administration in time range)  ondansetron (ZOFRAN) tablet 4 mg (has no administration in time range)    Or  ondansetron (ZOFRAN) injection 4 mg (has no administration in time range)  magnesium hydroxide (MILK OF MAGNESIA) suspension 30 mL (has no administration in time range)  cefTRIAXone (ROCEPHIN) 2 g in sodium chloride 0.9 % 100 mL IVPB (has no administration in time range)  azithromycin (ZITHROMAX) 500 mg in sodium chloride 0.9 % 250 mL IVPB (has no administration in time range)  guaiFENesin (MUCINEX) 12 hr tablet 600 mg (has no administration in time range)  sodium chloride 0.9 % bolus 1,000 mL (0 mLs Intravenous Stopped 07/30/20 2250)  ceFEPIme (MAXIPIME) 2 g in sodium chloride 0.9 % 100 mL IVPB (0 g Intravenous Stopped 07/30/20 2248)  iohexol (OMNIPAQUE) 350 MG/ML injection 75 mL (75 mLs Intravenous Contrast Given 07/30/20 2137)    Pertinent labs & imaging results that were available during my care of the patient were reviewed by me and considered in my medical decision making (see chart for details).  Ana Washington was evaluated in Emergency Department on 07/30/2020 for the symptoms described in the history of present illness. She was evaluated in the context of the global COVID-19 pandemic, which necessitated consideration that the patient might be at risk for infection with the SARS-CoV-2 virus that causes COVID-19. Institutional protocols and algorithms that pertain to the evaluation of patients at risk for COVID-19 are in a state of rapid change based on information released by regulatory bodies including the CDC and federal and state organizations. These policies and algorithms were followed during the patient's care in the ED.   Patient presents with worsening shortness of breath and cough.  Chest x-ray viewed and interpreted  by me and compared to 2 weeks ago, shows increased diffuse opacification of the right lung and left lower  lung.  Concern for HCAP given recent antibiotic use, versus progression of chronic lung disease..  CT scan of the chest negative for PE or other acute findings.  Patient meets sepsis criteria with tachycardia, tachypnea, borderline fever, leukocytosis.  Additionally her hyponatremia is worse than usual.  Blood cultures and lactate ordered, broad-spectrum antibiotics with cefepime and vancomycin.  Lactate is normal.  No further trending needed.  Discussed with hospitalist for further management.      ____________________________________________   FINAL CLINICAL IMPRESSION(S) / ED DIAGNOSES    Final diagnoses:  HCAP (healthcare-associated pneumonia)  Sepsis, due to unspecified organism, unspecified whether acute organ dysfunction present ([place])  Chronic respiratory failure with hypoxia ([place])  Protein-calorie malnutrition, severe ([place])  Atrial fibrillation, unspecified type [facility]     ED Discharge Orders     None       Portions of this note were generated with dragon dictation software. Dictation errors may occur despite best attempts at proofreading.   Carrie Mew, MD 07/30/20 [phone]

## 2020-07-31 ENCOUNTER — Encounter: Payer: Self-pay | Admitting: Internal Medicine

## 2020-07-31 ENCOUNTER — Inpatient Hospital Stay: Payer: Medicare HMO

## 2020-07-31 DIAGNOSIS — J471 Bronchiectasis with (acute) exacerbation: Secondary | ICD-10-CM | POA: Diagnosis present

## 2020-07-31 LAB — CBC
HCT: 29.1 % — ABNORMAL LOW (ref 36.0–46.0)
Hemoglobin: 9.9 g/dL — ABNORMAL LOW (ref 12.0–15.0)
MCH: 33.7 pg (ref 26.0–34.0)
MCHC: 34 g/dL (ref 30.0–36.0)
MCV: 99 fL (ref 80.0–100.0)
Platelets: 233 10*3/uL (ref 150–400)
RBC: 2.94 MIL/uL — ABNORMAL LOW (ref 3.87–5.11)
RDW: 12.5 % (ref 11.5–15.5)
WBC: 11.8 10*3/uL — ABNORMAL HIGH (ref 4.0–10.5)
nRBC: 0 % (ref 0.0–0.2)

## 2020-07-31 LAB — BASIC METABOLIC PANEL
Anion gap: 6 (ref 5–15)
BUN: 6 mg/dL — ABNORMAL LOW (ref 8–23)
CO2: 32 mmol/L (ref 22–32)
Calcium: 8.1 mg/dL — ABNORMAL LOW (ref 8.9–10.3)
Chloride: 93 mmol/L — ABNORMAL LOW (ref 98–111)
Creatinine, Ser: 0.32 mg/dL — ABNORMAL LOW (ref 0.44–1.00)
GFR, Estimated: >60 mL/min (ref >60)
Glucose, Bld: 107 mg/dL — ABNORMAL HIGH (ref 70–99)
Potassium: 3.5 mmol/L (ref 3.5–5.1)
Sodium: 131 mmol/L — ABNORMAL LOW (ref 135–145)

## 2020-07-31 LAB — STREP PNEUMONIAE URINARY ANTIGEN: Strep Pneumo Urinary Antigen: NEGATIVE

## 2020-07-31 LAB — TROPONIN I (HIGH SENSITIVITY)
Troponin I (High Sensitivity): 35 ng/L — ABNORMAL HIGH (ref <18)
Troponin I (High Sensitivity): 33 ng/L — ABNORMAL HIGH (ref <18)

## 2020-07-31 LAB — PROCALCITONIN: Procalcitonin: <0.1 ng/mL

## 2020-07-31 MED ORDER — OXYCODONE HCL 5 MG PO TABS: 5.0000 mg | ORAL_TABLET | ORAL | Status: DC | PRN

## 2020-07-31 MED ORDER — IPRATROPIUM-ALBUTEROL 0.5-2.5 (3) MG/3ML IN SOLN: 3.0000 mL | Freq: Four times a day (QID) | RESPIRATORY_TRACT | Status: DC | PRN

## 2020-07-31 MED ORDER — GABAPENTIN 300 MG PO CAPS: 300.0000 mg | ORAL_CAPSULE | Freq: Three times a day (TID) | ORAL | Status: DC

## 2020-07-31 MED ORDER — LEVALBUTEROL HCL 0.63 MG/3ML IN NEBU
1.5000 mL | INHALATION_SOLUTION | RESPIRATORY_TRACT | Status: DC | PRN
  Filled 2020-07-31: qty 3

## 2020-07-31 MED ORDER — MORPHINE SULFATE (PF) 2 MG/ML IV SOLN
2.0000 mg | INTRAVENOUS | Status: DC | PRN
  Administered 2020-07-31: 2 mg via INTRAVENOUS
  Filled 2020-07-31: qty 1

## 2020-07-31 MED ORDER — SODIUM CHLORIDE 0.9 % IV SOLN
2.0000 g | Freq: Two times a day (BID) | INTRAVENOUS | Status: DC
  Administered 2020-07-31 – 2020-08-05 (×12): 2 g via INTRAVENOUS
  Filled 2020-07-31 (×15): qty 2

## 2020-07-31 MED ORDER — DILTIAZEM HCL 25 MG/5ML IV SOLN
10.0000 mg | INTRAVENOUS | Status: DC | PRN
  Filled 2020-07-31: qty 5

## 2020-07-31 MED ORDER — DILTIAZEM HCL 25 MG/5ML IV SOLN
10.0000 mg | Freq: Once | INTRAVENOUS | Status: AC
  Administered 2020-07-31: 10 mg via INTRAVENOUS
  Filled 2020-07-31: qty 5

## 2020-07-31 MED ORDER — LEVOFLOXACIN IN D5W 250 MG/50ML IV SOLN
250.0000 mg | INTRAVENOUS | Status: DC
  Filled 2020-07-31: qty 50

## 2020-07-31 MED ORDER — AMIODARONE HCL IN DEXTROSE 360-4.14 MG/200ML-% IV SOLN
60.0000 mg/h | INTRAVENOUS | Status: AC
  Administered 2020-07-31: 60 mg/h via INTRAVENOUS
  Filled 2020-07-31: qty 200

## 2020-07-31 MED ORDER — OXYBUTYNIN CHLORIDE 5 MG PO TABS
5.0000 mg | ORAL_TABLET | Freq: Three times a day (TID) | ORAL | Status: DC | PRN
  Filled 2020-07-31: qty 1

## 2020-07-31 MED ORDER — AMIODARONE HCL IN DEXTROSE 360-4.14 MG/200ML-% IV SOLN
30.0000 mg/h | INTRAVENOUS | Status: DC
  Administered 2020-07-31 – 2020-08-01 (×2): 30 mg/h via INTRAVENOUS
  Filled 2020-07-31 (×2): qty 200

## 2020-07-31 MED ORDER — METHYLPREDNISOLONE SODIUM SUCC 40 MG IJ SOLR
40.0000 mg | Freq: Two times a day (BID) | INTRAMUSCULAR | Status: DC
  Administered 2020-07-31: 40 mg via INTRAVENOUS
  Filled 2020-07-31: qty 1

## 2020-07-31 NOTE — Progress Notes (Signed)
PROGRESS NOTE    Ana Washington  RXV:[id number] DOB: 31-Oct-1945 DOA: 07/30/2020 PCP: [name], MD   Brief Narrative:   75 y.o. 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  She has been feeling "very very weak".  She was admitted here recently for exacerbation of her bronchiectasis from 6/14-6/20.  She was discharged home on p.o. Levaquin and prednisone.  She finished a course of IV remdesivir when she was here  The patient is well-known to both Dr. Ubaldo Glassing and Dr. Lanney Gins.  I appreciate consultations from both of them.  Clinical presentation is likely mixed combination of exacerbation of bronchiectasis, partial contribution from suspected urinary tract infection and rapid atrial fibrillation.  Patient was in rapid atrial fibrillation on admission.  Initially controlled however occasionally becomes tachycardic and acutely symptomatic.  Responded to IV Cardizem however rate became uncontrolled shortly thereafter.  After pulmonary evaluation was started on amiodarone infusion.   Assessment & Plan:   Active Problems:   CAP (community acquired pneumonia)   Bronchiectasis with acute exacerbation ([place])  Acute exacerbation of bronchiectasis Possible urinary tract infection Sepsis secondary to above Patient has advanced lung disease with frequent readmissions Most recently 2  weeks ago Patient is well-known to pulmonary consultant, recommendations appreciated Plan: IV cefepime Drawn follow blood cultures Mucolytic's Xopenex, avoiding DuoNebs due to likely exacerbation of atrial fibrillation Continue steroids Follow any pulmonary recommendations  Atrial fibrillation with rapid ventricular response Likely triggered by underlying infection and lung disease Home regimen includes digoxin and Toprol-XL Cardizem CD was discontinued on previous admission Patient known to Dr. Ubaldo Glassing Initially responded to Cardizem bolus however became rapid again Plan: Digoxin discontinued Initiate amiodarone infusion Continue Eliquis for VTE prophylaxis Telemetry monitoring  Essential hypertension Cozaar Toprol  GERD PPI  Anxiety PTA Ativan  Functional decline Frequent readmissions The patient had expressed an interest in engaging hospice services due to decline of function and frequent readmissions.  She would like to avoid being hospitalized in the future.  I agree with her assessment.  We have engaged consultation from both hospice liaison as well as palliative care services.  DVT prophylaxis: Eliquis Code Status: DNR Family Communication: None today.  Offered to call but patient declined Disposition Plan: Status is: Inpatient  Remains inpatient appropriate because:Inpatient level of care appropriate due to severity of illness  Dispo: The patient is from: Home              Anticipated d/c is to:  TBD              Patient currently is not medically stable to d/c.   Difficult to place patient No  Acute on chronic bronchiectasis, suspected pneumonia and sepsis, rapid atrial fibrillation.  Disposition plan pending.     Level of care: Progressive Cardiac  Consultants:  Cardiology, [facility] clinic Pulmonology  Procedures: None  Antimicrobials:  Cefepime   Subjective: Patient seen and examined.  Resting in bed.  Visibly tachypneic.  Mentating clearly.   No pain complaints  Objective: Vitals:   07/31/20 1030 07/31/20 1031 07/31/20 1045 07/31/20 1100  BP: (!) 132/92 (!) 132/92  126/84  Pulse: (!) 126  79 96  Resp:   (!) 34 (!) 39  Temp:      TempSrc:      SpO2: 97%  98% 98%  Weight:      Height:        Intake/Output Summary (Last 24 hours) at 07/31/2020 1342 Last data filed at 07/31/2020 0807 Gross per 24 hour  Intake 1228.31 ml  Output --  Net 1228.31 ml   Filed Weights   07/30/20 2003  Weight: 49.9 kg    Examination:  General exam: No acute distress.  Appears frail Respiratory system: Tachypneic.  Increased work of breathing.  Coarse crackles bilaterally.  Room air Cardiovascular system: Tachycardic, irregular rhythm, no murmurs, no pedal edema Gastrointestinal system: Abdomen is nondistended, soft and nontender. No organomegaly or masses felt. Normal bowel sounds heard. Central nervous system: Alert and oriented. No focal neurological deficits. Extremities: Symmetric 5 x 5 power. Skin: Pale.  No obvious rashes or lesions Psychiatry: Judgement and insight appear normal. Mood & affect appropriate.     Data Reviewed: I have personally reviewed following labs and imaging studies  CBC: Recent Labs  Lab 07/30/20 2035 07/31/20 0436  WBC 17.6* 11.8*  NEUTROABS 14.2*  --   HGB 11.3* 9.9*  HCT 31.9* 29.1*  MCV 96.1 99.0  PLT 309 371   Basic Metabolic Panel: Recent Labs  Lab 07/30/20 2035 07/31/20 0436  NA 126* 131*  K 3.8 3.5  CL 84* 93*  CO2 34* 32  GLUCOSE 119* 107*  BUN 6* 6*  CREATININE 0.42* 0.32*  CALCIUM 8.9 8.1*   GFR: Estimated Creatinine Clearance: 47.9 mL/min (A) (by C-G formula based on SCr of 0.32 mg/dL (L)). Liver Function Tests: Recent Labs  Lab 07/30/20 2035  AST 26  ALT 17  ALKPHOS 43  BILITOT 0.7  PROT 6.0*  ALBUMIN 3.2*   No results for input(s): LIPASE, AMYLASE in the last 168 hours. No results for input(s): AMMONIA in the last 168 hours. Coagulation Profile: Recent Labs   Lab 07/30/20 2035  INR 1.4*   Cardiac Enzymes: No results for input(s): CKTOTAL, CKMB, CKMBINDEX, TROPONINI in the last 168 hours. BNP (last 3 results) No results for input(s): PROBNP in the last 8760 hours. HbA1C: No results for input(s): HGBA1C in the last 72 hours. CBG: No results for input(s): GLUCAP in the last 168 hours. Lipid Profile: No results for input(s): CHOL, HDL, LDLCALC, TRIG, CHOLHDL, LDLDIRECT in the last 72 hours. Thyroid Function Tests: No results for input(s): TSH, T4TOTAL, FREET4, T3FREE, THYROIDAB in the last 72 hours. Anemia Panel: No results for input(s): VITAMINB12, FOLATE, FERRITIN, TIBC, IRON, RETICCTPCT in the last 72 hours. Sepsis Labs: Recent Labs  Lab 07/30/20 2035 07/31/20 1051  PROCALCITON  --  <0.10  LATICACIDVEN 1.0  --     Recent Results (from the past 240 hour(s))  Blood culture (single)     Status: None (Preliminary result)   Collection Time: 07/30/20  8:36 PM   Specimen: BLOOD  Result Value Ref Range Status   Specimen Description BLOOD BLOOD RIGHT FOREARM  Final   Special Requests   Final    BOTTLES DRAWN AEROBIC AND ANAEROBIC Blood Culture adequate volume   Culture   Final    NO GROWTH < 12 HOURS Performed at [facility], [address], [place], [place] [address]    Report  Status PENDING  Incomplete  Culture, blood (single)     Status: None (Preliminary result)   Collection Time: 07/30/20  9:31 PM   Specimen: BLOOD  Result Value Ref Range Status   Specimen Description BLOOD BLOOD LEFT FOREARM  Final   Special Requests   Final    BOTTLES DRAWN AEROBIC AND ANAEROBIC Blood Culture adequate volume   Culture   Final    NO GROWTH < 12 HOURS Performed at [facility], [address], [place], [place] [address]    Report Status PENDING  Incomplete         Radiology Studies: DG Chest 1 View  Result Date: 07/30/2020 CLINICAL DATA:  COVID diagnosis 5 weeks prior, known metastatic disease, increasing  shortness of breath with productive cough EXAM: CHEST  1 VIEW COMPARISON:  Radiograph 07/14/2020, CT 07/14/2020 FINDINGS: Large left diaphragmatic eventration. Some adjacent passive atelectatic changes. Diffuse reticulonodular opacities present throughout both lungs. Postsurgical changes in the left hilum and right upper lung are stable from priors and likely reflecting multiple resections. No pneumothorax. No visible layering effusion. Stable cardiomediastinal contours with a calcified aorta. Multilevel vertebroplasty changes are seen. No acute osseous abnormality or suspicious osseous lesion. The osseous structures appear diffusely demineralized which may limit detection of small or nondisplaced fractures. Degenerative changes in the shoulders, right greater than left with high-riding humeral head suggesting underlying rotator cuff insufficiency. Telemetry leads overlie the chest. IMPRESSION: Diffuse reticulonodular opacities throughout the lungs, can be seen in the setting as atypical mycobacterial infection as suggested previously versus additional acute superimposed infection or inflammation. Large left diaphragmatic eventration. Chronic scarring and architectural distortion with areas of postsurgical changes in the left hilum and right upper lung. Aortic Atherosclerosis (ICD10-I70.0). Multilevel vertebroplasty. Electronically Signed   By: Lovena Le M.D.   On: 07/30/2020 20:34   CT Angio Chest PE W and/or Wo Contrast  Result Date: 07/30/2020 CLINICAL DATA:  Subacute COVID pneumonia, cough EXAM: CT ANGIOGRAPHY CHEST WITH CONTRAST TECHNIQUE: Multidetector CT imaging of the chest was performed using the standard protocol during bolus administration of intravenous contrast. Multiplanar CT image reconstructions and MIPs were obtained to evaluate the vascular anatomy. CONTRAST:  62m OMNIPAQUE IOHEXOL 350 MG/ML SOLN COMPARISON:  07/14/2020 FINDINGS: Cardiovascular: There is adequate opacification of the  residual pulmonary arterial tree. No intraluminal filling defect to suggest acute pulmonary embolism. The central pulmonary arteries are enlarged in keeping with changes of pulmonary arterial hypertension, unchanged from prior examination. Mild multi-vessel coronary artery calcification. Global cardiac size within normal limits. No pericardial effusion. Moderate atherosclerotic calcification within the abdominal aorta. No aortic aneurysm. Mediastinum/Nodes: No pathologic thoracic adenopathy. The visualized thyroid is unremarkable. Small amount of debris is seen within the esophagus possibly reflecting changes of esophageal dysmotility or gastroesophageal reflux. A large hiatal hernia is seen extending into the left hemithorax with herniation of the splenic flexure of the colon and nearly the entire stomach into the left hemithorax. There is marked tortuosity of the thoracic aorta which extends into the posterior left hemithorax as result of the a large hernia. Lungs/Pleura: Status post partial right upper lobectomy and right middle lobectomy as well as left lower lobectomy. 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 There is superimposed areas of cylindrical and varicoid bronchiectasis again identified, more focal within the residual right upper lobe and basilar right lower lobe though chronic infection with atypical organisms such as MAI could result in such  an appearance, an additional consideration should include the sequela of remote or recurrent infection and/or aspiration. There is these changes appears stable since prior examination. No new focal pulmonary nodules or infiltrates. No pneumothorax or pleural effusion. Upper Abdomen: Status post cholecystectomy.  No acute abnormality. Musculoskeletal: Multilevel thoracolumbar vertebroplasty has been performed of each visualized level inferior to T4. Stable remote  compression fracture of T3. No acute bone abnormality. Review of the MIP images confirms the above findings. IMPRESSION: No pulmonary embolism. Mild coronary artery calcification. Morphologic changes in keeping with pulmonary arterial hypertension. Status post partial right upper lobe, right middle lobe, and left lower lobectomy. Extensive scattered areas of cylindrical and varicoid bronchiectasis with mild tree-in-bud nodularity stable since prior examination. Differential considerations include the sequela of remote or recurrent infection, aspiration, and atypical infection as can be seen with mycobacterial or atypical fungal infection. Mosaic attenuation of the pulmonary parenchyma in keeping with air trapping secondary to small airways disease. Aortic Atherosclerosis (ICD10-I70.0). Electronically Signed   By: Fidela Salisbury MD   On: 07/30/2020 22:16        Scheduled Meds:  acetylcysteine  600 mg Oral QPC breakfast   acidophilus  1 capsule Oral Daily   amLODipine  2.5 mg Oral Daily   apixaban  5 mg Oral BID   vitamin C  1,000 mg Oral Daily   guaiFENesin  600 mg Oral BID   LORazepam  0.5 mg Oral QHS   potassium chloride  10 mEq Oral Daily   predniSONE  5 mg Oral Daily   spironolactone  25 mg Oral Daily   Continuous Infusions:  amiodarone 60 mg/hr (07/31/20 1041)   amiodarone     ceFEPime (MAXIPIME) IV 2 g (07/31/20 1052)     LOS: 1 day    Time spent: 35 minutes    Sidney Ace, MD [facility] Pager [phone]  If 7PM-7AM, please contact night-coverage 07/31/2020, 1:42 PM

## 2020-07-31 NOTE — Consult Note (Signed)
Pharmacy Antibiotic Note  Ana Washington is a 75 y.o. female with a past medical history of CAD, HTN, GERD, chronic MAC infection and bronchiectasis with recent COVID-19 infection who presented to the ED due to increased productive cough and shortness of breath. Pharmacy has been consulted for cefepime dosing for bronchiectasis exacerbation.  Plan: Cefepime 2g IV q12h Monitor clinical picture and renal function F/U C&S, abx deescalation / LOT  Height: _0  (165.1 cm) Weight: 49.9 kg (110 lb) IBW/kg (Calculated) : 57  Temp (24hrs), Avg:99 F (37.2 C), Min:98.3 F (36.8 C), Max:99.7 F (37.6 C)  Recent Labs  Lab 07/30/20 2035 07/31/20 0436  WBC 17.6* 11.8*  CREATININE 0.42* 0.32*  LATICACIDVEN 1.0  --     Estimated Creatinine Clearance: 47.9 mL/min (A) (by C-G formula based on SCr of 0.32 mg/dL (L)).    Allergies  Allergen Reactions   Codeine Nausea And Vomiting   Sulfa Antibiotics Diarrhea   Penicillins Rash    Has patient had a PCN reaction causing immediate rash, facial/tongue/throat swelling, SOB or lightheadedness with hypotension: Unknown Has patient had a PCN reaction causing severe rash involving mucus membranes or skin necrosis: Unknown Has patient had a PCN reaction that required hospitalization: Unknown Has patient had a PCN reaction occurring within the last 10 years: No If all of the above answers are "NO", then may proceed with Cephalosporin use.     Antimicrobials this admission: 6/30 cefepime >>  6/30 vancomycin x1 6/30 ceftriaxone x1 6/30 azithromycin x1 6/30 tobramycin neb x1  Dose adjustments this admission: N/A  Microbiology results: 6/30 BCx: pending 6/30 Sputum: pending   Thank you for allowing pharmacy to be a part of this patient's care.  Darnelle Bos, PharmD 07/31/2020 9:56 AM

## 2020-07-31 NOTE — ED Provider Notes (Signed)
EKG is reviewed interpreted by me at 8:30 AM Heart rate 110 QRs 80 QTc 369 A. fib, mild ST segment abnormality including T wave inversions V5 V6 lead II.  No evidence of STEMI  Dr. Ralene Muskrat notified of EKG complete.   Delman Kitten, MD 07/31/20 [phone]

## 2020-07-31 NOTE — Progress Notes (Signed)
[facility])   [facility]) [facility] Liaison Note:  Referral received for hospice services at home.   Met with patient and her daughter at bedside. Patient states that should would like to receive aggressive treatment during this hospitalization and declines hospice services at this time.   Please do not hesitate to call for any hospice related questions.   Thank you,   Bobbie "Loren Racer, [place], BSN [facility] Liaison [phone]

## 2020-07-31 NOTE — Evaluation (Signed)
Occupational Therapy Evaluation Patient Details Name: Ana Washington MRN: [id number] DOB: [birth year] Today's Date: 07/31/2020    History of Present Illness Pt is a 75 y/o F with PMH: COVID 5WA, Afib, CAD, HTN, bronchiectasis and chronic MAC who presents d/t cough and SOB. Concern for HCAP given recent abx use.   Clinical Impression   Pt seen for OT evaluation this date in setting of acute hospitalization d/t PNA. Pt reports being MOD I for fxl mobility at baseline, and requires some aide help for IADLs such as meal prep and laundry and for shower transfers. She is otherwise able to complete self care at MOD I. Pt as experienced significant decline since COVID ~5WA. Pt presents this date with decreased fxl activity tolerance and decreased strength impacting her ability to safely and efficiently perform ADLs/ADL mobility. Pt currently requires SETUP to MIN A for seated UB ADLs, MAX A for seated LB ADLs, very effortful for LB ADLs d/t SOB. Pt able to tolerate sup to sit transition with MIN A but declines to attempt standing or fxl mobility at this time citing fatigue, weakness and SOB. Will continue to follow acutely. Given pt's current weakness and decreased fxl activity tolerance, anticipate she may require STR stay to improve strength and tolerance for fxl mobility as she does not have home assistance available for the majority of the day and overnight and cannot currently tolerate standing/walking to complete basic self care.    Follow Up Recommendations  SNF    Equipment Recommendations  None recommended by OT (pt has all necessary equipment)    Recommendations for Other Services       Precautions / Restrictions Precautions Precautions: Fall Precaution Comments: mod fall Restrictions Weight Bearing Restrictions: No      Mobility Bed Mobility Overal bed mobility: Needs Assistance Bed Mobility: Sit to Supine     Supine to sit: Min assist Sit to supine: Min assist   General bed  mobility comments: increased time and effort, HOB elevated, assist to get LEs back to bed    Transfers                 General transfer comment: deferred d/t weakness/SOB/decreased tolerance    Balance Overall balance assessment: Needs assistance   Sitting balance-Leahy Scale: Good         Standing balance comment: deferred                           ADL either performed or assessed with clinical judgement   ADL Overall ADL's : Needs assistance/impaired                                       General ADL Comments: SETUP to MIN A for seated UB ADLs, MAX A for seated LB ADLs, very effortful for LB ADLs d/t SOB     Vision Baseline Vision/History: Wears glasses Wears Glasses: At all times Patient Visual Report: No change from baseline       Perception     Praxis      Pertinent Vitals/Pain Pain Assessment: No/denies pain     Hand Dominance Right   Extremity/Trunk Assessment Upper Extremity Assessment Upper Extremity Assessment: Generalized weakness   Lower Extremity Assessment Lower Extremity Assessment: Generalized weakness       Communication Communication Communication: No difficulties   Cognition Arousal/Alertness: Awake/alert Behavior During Therapy: [facility]  for tasks assessed/performed Overall Cognitive Status: Within Functional Limits for tasks assessed                                     General Comments       Exercises Other Exercises Other Exercises: OT ed re: importance of posture in consideration with lung fxn. OT engages pt in postural extension/scap retraction to improve surfacef area for more quality breaths.   Shoulder Instructions      Home Living Family/patient expects to be discharged to:: Private residence Living Arrangements: Spouse/significant other Available Help at Discharge: Family;Available 24 hours/day;Personal care attendant (aide M-F 9A-1P) Type of Home: House Home Access: Level  entry     Home Layout: One level     Bathroom Shower/Tub: Teacher, early years/pre: Handicapped height     Home Equipment: Environmental consultant - 2 wheels;Bedside commode;Tub bench;Wheelchair - Education officer, community - power          Prior Functioning/Environment Level of Independence: Needs assistance  Gait / Transfers Assistance Needed: HH amb with RW ADL's / Homemaking Assistance Needed: Pt has been requiring aide help with t/f into/out of shower and drying off, laundry and breakfast/lunch meal prep M-F from 9A-1P since pt had COVID ~1 month ago. She is otherwise able to perform basic self care. Pt's sister assists with transportation such as to get groceries or to MD appts            OT Problem List: Decreased strength;Decreased activity tolerance;Impaired balance (sitting and/or standing);Cardiopulmonary status limiting activity      OT Treatment/Interventions: Self-care/ADL training;Therapeutic exercise;Patient/family education;Balance training;Neuromuscular education;Energy conservation;Therapeutic activities;DME and/or AE instruction    OT Goals(Current goals can be found in the care plan section) Acute Rehab OT Goals Patient Stated Goal: to stop coming back to the hospital so often get stronger for my family OT Goal Formulation: With patient Time For Goal Achievement: 08/14/20 Potential to Achieve Goals: Good ADL Goals Pt Will Perform Upper Body Dressing: with modified independence;sitting Pt Will Perform Lower Body Dressing: with supervision;sitting/lateral leans (with AE PRN for EC) Pt Will Transfer to Toilet: with supervision;ambulating;bedside commode (with LRAD to/from [facility] ~10' away to increase tolerance for fxl HH distances.) Pt Will Perform Toileting - Clothing Manipulation and hygiene: with supervision;sit to/from stand Additional ADL Goal #1: pt will be able to verbalize/implement 3 EC strategies into ADL routines.  OT Frequency: Min 2X/week   Barriers to D/C:             Co-evaluation              AM-PAC OT "6 Clicks" Daily Activity     Outcome Measure Help from another person eating meals?: None Help from another person taking care of personal grooming?: A Little Help from another person toileting, which includes using toliet, bedpan, or urinal?: A Little Help from another person bathing (including washing, rinsing, drying)?: A Little Help from another person to put on and taking off regular upper body clothing?: A Little Help from another person to put on and taking off regular lower body clothing?: A Lot 6 Click Score: 18   End of Session Equipment Utilized During Treatment: Rolling walker Nurse Communication: Mobility status  Activity Tolerance: Patient tolerated treatment well;Other (comment);Patient limited by fatigue (limited by SOB) Patient left: in chair;with call bell/phone within reach;with chair alarm set  OT Visit Diagnosis: Unsteadiness on feet (R26.81);Muscle weakness (generalized) (M62.81)  Time: 0634-9494 OT Time Calculation (min): 29 min Charges:  OT General Charges $OT Visit: 1 Visit OT Evaluation $OT Eval Moderate Complexity: 1 Mod OT Treatments $Self Care/Home Management : 8-22 mins  Gerrianne Scale, MS, OTR/L ascom [phone] 07/31/20, 5:35 PM

## 2020-07-31 NOTE — ED Notes (Signed)
No change in condition, will continue to monitor.  

## 2020-07-31 NOTE — ED Notes (Signed)
Pt resting comfortably with eyes closed but is easily aroused. Pt awaiting hospital room availability. No co pain or discomfort at this time, will continue to monitor.

## 2020-07-31 NOTE — ED Notes (Signed)
Patient provided with meal tray.

## 2020-07-31 NOTE — ED Notes (Signed)
Report received from [facility]. Patient care assumed. Patient/RN introduction complete. Will continue to monitor.

## 2020-07-31 NOTE — ED Notes (Signed)
Report received from Amber, RN °

## 2020-07-31 NOTE — ED Notes (Signed)
Patient transported to CT 

## 2020-07-31 NOTE — Consult Note (Addendum)
Cardiology Consultation Note    Patient ID: Ana Washington, MRN: [id number], DOB/AGE: 1945-03-31 75 y.o. Admit date: 07/30/2020   Date of Consult: 07/31/2020 Primary Physician: [name], MD Primary Cardiologist: Dr. Ubaldo Glassing  Chief Complaint: sob Reason for Consultation: afib Requesting MD: Dr. Priscella Mann  HPI: Ana Washington is a 75 y.o. female with history of Takotsubo syndrome with normal coronaries and ejection fraction of 45%, history of paroxysmal atrial fibrillation, hypertension who presented to emergency room with shortness of breath and was noted to have diffuse rectal nodular opacities throughout the lungs consistent with atypical mycobacterial infection.  Chest CT revealed, atypical infection consistent as above with mycobacterial or atypical fungal infection as well as aspiration.  Pulmonary embolism.  EKG on admission revealed atrial fibrillation.  Patient states she was taken off of Cardizem at a previous hospitalization.  EKG shows A. fib with controlled ventricular response at present in the 80s to 90s.  Past Medical History:  Diagnosis Date   Arthritis    Atrial fibrillation ([place])    Bronchiectasis ([facility])    CAD (coronary artery disease) 07/30/2017   Dumping syndrome    Essential hypertension, malignant 10/03/2013   Family history of adverse reaction to anesthesia    sister [name]   GERD (gastroesophageal reflux disease)    Headache    MIGRAINES   Myocardial infarction ([place]) 2007   Non-STEMI   [name] (postoperative nausea and vomiting)    Psoriasis    PUD (peptic ulcer disease)       Surgical History:  Past Surgical History:  Procedure Laterality Date   BACK SURGERY     CHOLECYSTECTOMY     ESOPHAGOGASTRODUODENOSCOPY (EGD) WITH PROPOFOL N/A 03/19/2019   Procedure: ESOPHAGOGASTRODUODENOSCOPY (EGD) WITH PROPOFOL;  Surgeon: [name], MD;  Location[facility] ENDOSCOPY;  Service: Gastroenterology;  Laterality: N/A;  *Note to anesthesia: Per pt's pulmonologist, if  intubating, please extubate to BIPAP.   EYE SURGERY     FOOT SURGERY     INTRAMEDULLARY (IM) NAIL INTERTROCHANTERIC Right 09/30/2018   Procedure: INTRAMEDULLARY (IM) NAIL INTERTROCHANTRIC;  Surgeon: Dereck Leep, MD;  Location: [facility] ORS;  Service: Orthopedics;  Laterality: Right;   KYPHOPLASTY N/A 07/05/2016   Procedure: KYPHOPLASTY T - 9;  Surgeon: Hessie Knows, MD;  Location: [facility] ORS;  Service: Orthopedics;  Laterality: N/A;   KYPHOPLASTY N/A 11/29/2017   Procedure: Iona Hansen;  Surgeon: Hessie Knows, MD;  Location: [facility] ORS;  Service: Orthopedics;  Laterality: N/A;  L2 and L3   KYPHOPLASTY N/A 12/18/2017   Procedure: KYPHOPLASTY L1;  Surgeon: Hessie Knows, MD;  Location: [facility] ORS;  Service: Orthopedics;  Laterality: N/A;   KYPHOPLASTY N/A 01/05/2018   Procedure: KYPHOPLASTY-T11,T12;  Surgeon: Hessie Knows, MD;  Location: [facility] ORS;  Service: Orthopedics;  Laterality: N/A;   KYPHOPLASTY N/A 04/05/2018   Procedure: T10 KYPHOPLASTY;  Surgeon: Hessie Knows, MD;  Location: [facility] ORS;  Service: Orthopedics;  Laterality: N/A;   KYPHOPLASTY N/A 04/12/2018   Procedure: KYPHOPLASTY T7,8;  Surgeon: Hessie Knows, MD;  Location: [facility] ORS;  Service: Orthopedics;  Laterality: N/A;   KYPHOPLASTY N/A 04/19/2018   Procedure: KYPHOPLASTY T5, T6;  Surgeon: Hessie Knows, MD;  Location: [facility] ORS;  Service: Orthopedics;  Laterality: N/A;   LUNG SURGERY  1990 and 1996   THOROCOTOMY WITH LOBECTOMY     LEFT LOWER THORACOTOMY / RIGHT MIDDLE LOBECTOMY     Home Meds: Prior to Admission medications   Medication Sig Start Date End Date Taking? Authorizing Provider  acetaminophen (TYLENOL) 500  MG tablet Take 1,000 mg by mouth every 6 (six) hours as needed for mild pain or headache.    Yes [provider]  acetylcysteine (MUCOMYST) 20 % nebulizer solution Take 4 mLs by nebulization daily. 04/22/20  Yes [provider]  amLODipine (NORVASC) 2.5 MG tablet Take 2.5 mg by mouth daily. 05/11/20   Yes [provider]  apixaban (ELIQUIS) 5 MG TABS tablet Take 5 mg by mouth 2 (two) times daily.    Yes [provider]  Ascorbic Acid (VITAMIN C) 1000 MG tablet Take 1,000 mg by mouth daily.   Yes [provider]  azithromycin (ZITHROMAX) 250 MG tablet Take 250 mg by mouth daily.   Yes [provider]  COMBIVENT RESPIMAT 20-100 MCG/ACT AERS respimat 1 puff every 6 (six) hours as needed for wheezing. 06/09/20  Yes [provider]  digoxin (LANOXIN) 0.125 MG tablet Take 125 mcg by mouth daily. 05/11/20  Yes [provider]  ferrous sulfate 325 (65 FE) MG EC tablet Take 325 mg by mouth daily.    Yes [provider]  furosemide (LASIX) 20 MG tablet Take 20 mg by mouth daily as needed for edema. 07/08/19  Yes [provider]  gabapentin (NEURONTIN) 300 MG capsule Take 300 mg by mouth 4 (four) times daily.    Yes [provider]  glycopyrrolate (ROBINUL) 1 MG tablet Take 1 tablet by mouth 3 (three) times daily. 01/06/20 01/05/21 Yes [provider]  levalbuterol (XOPENEX) 0.31 MG/3ML nebulizer solution Inhale 3 mLs into the lungs every 6 (six) hours as needed. 12/10/19  Yes [provider]  LORazepam (ATIVAN) 0.5 MG tablet Take 1 tablet (0.5 mg total) by mouth at bedtime. 09/10/19  Yes Enzo Bi, MD  metoprolol succinate (TOPROL-XL) 25 MG 24 hr tablet Take 25 mg by mouth at bedtime.   Yes [provider]  NAC 600 MG CAPS Take 600 mg by mouth daily after breakfast.  08/17/19  Yes [provider]  omeprazole (PRILOSEC) 20 MG capsule Take 20 mg by mouth daily. 08/26/19  Yes [provider]  polyethylene glycol (MIRALAX / GLYCOLAX) 17 g packet Take 17 g by mouth daily as needed for severe constipation. 05/02/20  Yes Wieting, Richard, MD  potassium chloride (K-DUR) 10 MEQ tablet Take 10 mEq by mouth daily.   Yes [provider]  Probiotic Product (ALIGN) 4 MG CAPS Take 4 mg by mouth  daily.   Yes [provider]  senna (SENOKOT) 8.6 MG TABS tablet Take 2 tablets (17.2 mg total) by mouth at bedtime. 05/02/20  Yes Wieting, Richard, MD  spironolactone (ALDACTONE) 25 MG tablet Take 25 mg by mouth daily. 07/24/20  Yes [provider]  tobramycin, PF, (TOBI) 300 MG/5ML nebulizer solution Take 5 mLs by nebulization 2 (two) times daily. 01/10/20  Yes [provider]  benzonatate (TESSALON) 200 MG capsule Take 200 mg by mouth 3 (three) times daily as needed. Patient not taking: Reported on 07/30/2020 04/02/20   [provider]  diltiazem (CARDIZEM CD) 180 MG 24 hr capsule Take 180 mg by mouth daily. Patient not taking: Reported on 07/30/2020 06/21/20   [provider]  losartan (COZAAR) 100 MG tablet Take 100 mg by mouth daily. Patient not taking: Reported on 07/30/2020 07/25/19   [provider]  metoprolol succinate (TOPROL-XL) 100 MG 24 hr tablet Take 1 tablet (100 mg total) by mouth daily. Patient not taking: Reported on 07/30/2020 10/11/18   Hillary Bow, MD  predniSONE (  DELTASONE) 10 MG tablet Take 10 mg by mouth daily. Patient not taking: Reported on 07/30/2020 07/17/20   [provider]    Inpatient Medications:   acetylcysteine  4 mL Nebulization Daily   acetylcysteine  600 mg Oral QPC breakfast   acidophilus  1 capsule Oral Daily   amLODipine  2.5 mg Oral Daily   apixaban  5 mg Oral BID   vitamin C  1,000 mg Oral Daily   digoxin  125 mcg Oral Daily   ferrous sulfate  325 mg Oral Daily   gabapentin  300 mg Oral QID   glycopyrrolate  1 mg Oral TID   guaiFENesin  600 mg Oral BID   LORazepam  0.5 mg Oral QHS   metoprolol succinate  25 mg Oral Daily   pantoprazole  40 mg Oral Daily   potassium chloride  10 mEq Oral Daily   predniSONE  5 mg Oral Daily   spironolactone  25 mg Oral Daily   tobramycin (PF)  300 mg Nebulization BID    [START ON 08/01/2020] levofloxacin (LEVAQUIN) IV      Allergies:  Allergies   Allergen Reactions   Codeine Nausea And Vomiting   Sulfa Antibiotics Diarrhea   Penicillins Rash    Has patient had a PCN reaction causing immediate rash, facial/tongue/throat swelling, SOB or lightheadedness with hypotension: Unknown Has patient had a PCN reaction causing severe rash involving mucus membranes or skin necrosis: Unknown Has patient had a PCN reaction that required hospitalization: Unknown Has patient had a PCN reaction occurring within the last 10 years: No If all of the above answers are "NO", then may proceed with Cephalosporin use.     Social History   Socioeconomic History   Marital status: Married    Spouse name: Not on file   Number of children: Not on file   Years of education: Not on file   Highest education level: Not on file  Occupational History   Not on file  Tobacco Use   Smoking status: Never   Smokeless tobacco: Never  Vaping Use   Vaping Use: Never used  Substance and Sexual Activity   Alcohol use: No   Drug use: No   Sexual activity: Not on file  Other Topics Concern   Not on file  Social History Narrative   Not on file   Social Determinants of Health   Financial Resource Strain: Not on file  Food Insecurity: Not on file  Transportation Needs: Not on file  Physical Activity: Not on file  Stress: Not on file  Social Connections: Not on file  Intimate Partner Violence: Not on file     Family History  Problem Relation Age of Onset   Hypertension Mother    Hypertension Father      Review of Systems: A 12-system review of systems was performed and is negative except as noted in the HPI.  Labs: No results for input(s): CKTOTAL, CKMB, TROPONINI in the last 72 hours. Lab Results  Component Value Date   WBC 11.8 (H) 07/31/2020   HGB 9.9 (L) 07/31/2020   HCT 29.1 (L) 07/31/2020   MCV 99.0 07/31/2020   PLT 233 07/31/2020    Recent Labs  Lab 07/30/20 2035 07/31/20 0436  NA 126* 131*  K 3.8 3.5  CL 84* 93*  CO2 34* 32   BUN 6* 6*  CREATININE 0.42* 0.32*  CALCIUM 8.9 8.1*  PROT 6.0*  --   BILITOT 0.7  --   [facility]  43  --   ALT 17  --   AST 26  --   GLUCOSE 119* 107*   No results found for: CHOL, HDL, LDLCALC, TRIG Lab Results  Component Value Date   DDIMER 0.57 (H) 07/14/2020    Radiology/Studies:  DG Chest 1 View  Result Date: 07/30/2020 CLINICAL DATA:  COVID diagnosis 5 weeks prior, known metastatic disease, increasing shortness of breath with productive cough EXAM: CHEST  1 VIEW COMPARISON:  Radiograph 07/14/2020, CT 07/14/2020 FINDINGS: Large left diaphragmatic eventration. Some adjacent passive atelectatic changes. Diffuse reticulonodular opacities present throughout both lungs. Postsurgical changes in the left hilum and right upper lung are stable from priors and likely reflecting multiple resections. No pneumothorax. No visible layering effusion. Stable cardiomediastinal contours with a calcified aorta. Multilevel vertebroplasty changes are seen. No acute osseous abnormality or suspicious osseous lesion. The osseous structures appear diffusely demineralized which may limit detection of small or nondisplaced fractures. Degenerative changes in the shoulders, right greater than left with high-riding humeral head suggesting underlying rotator cuff insufficiency. Telemetry leads overlie the chest. IMPRESSION: Diffuse reticulonodular opacities throughout the lungs, can be seen in the setting as atypical mycobacterial infection as suggested previously versus additional acute superimposed infection or inflammation. Large left diaphragmatic eventration. Chronic scarring and architectural distortion with areas of postsurgical changes in the left hilum and right upper lung. Aortic Atherosclerosis (ICD10-I70.0). Multilevel vertebroplasty. Electronically Signed   By: Lovena Le M.D.   On: 07/30/2020 20:34   DG Chest 2 View  Result Date: 07/14/2020 CLINICAL DATA:  75 year old female with shortness of breath and  cough. EXAM: CHEST - 2 VIEW COMPARISON:  Chest CT 04/29/2020 and earlier. FINDINGS: Seated AP and lateral views of the chest. Large chronic left diaphragmatic hernia appears stable. Stable cardiac size and mediastinal contours. Bronchiectasis better demonstrated by CT. Chronic staple line in the right upper lobe. Stable coarse bilateral pulmonary interstitial opacity. No pneumothorax, pulmonary edema, or acute pulmonary opacity. Diffuse spinal compression fractures with at least 10 levels previously augmented. Stable visualized osseous structures. Negative visible bowel gas pattern. IMPRESSION: Chronic lung disease with bronchiectasis. Chronic left diaphragmatic hernia. No acute cardiopulmonary abnormality identified. Electronically Signed   By: Genevie Ann M.D.   On: 07/14/2020 11:54   CT Angio Chest PE W and/or Wo Contrast  Result Date: 07/30/2020 CLINICAL DATA:  Subacute COVID pneumonia, cough EXAM: CT ANGIOGRAPHY CHEST WITH CONTRAST TECHNIQUE: Multidetector CT imaging of the chest was performed using the standard protocol during bolus administration of intravenous contrast. Multiplanar CT image reconstructions and MIPs were obtained to evaluate the vascular anatomy. CONTRAST:  52m OMNIPAQUE IOHEXOL 350 MG/ML SOLN COMPARISON:  07/14/2020 FINDINGS: Cardiovascular: There is adequate opacification of the residual pulmonary arterial tree. No intraluminal filling defect to suggest acute pulmonary embolism. The central pulmonary arteries are enlarged in keeping with changes of pulmonary arterial hypertension, unchanged from prior examination. Mild multi-vessel coronary artery calcification. Global cardiac size within normal limits. No pericardial effusion. Moderate atherosclerotic calcification within the abdominal aorta. No aortic aneurysm. Mediastinum/Nodes: No pathologic thoracic adenopathy. The visualized thyroid is unremarkable. Small amount of debris is seen within the esophagus possibly reflecting changes of  esophageal dysmotility or gastroesophageal reflux. A large hiatal hernia is seen extending into the left hemithorax with herniation of the splenic flexure of the colon and nearly the entire stomach into the left hemithorax. There is marked tortuosity of the thoracic aorta which extends into the posterior left hemithorax as result of the a large hernia. Lungs/Pleura: Status post  partial right upper lobectomy and right middle lobectomy as well as left lower lobectomy. 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 There is superimposed areas of cylindrical and varicoid bronchiectasis again identified, more focal within the residual right upper lobe and basilar right lower lobe though chronic infection with atypical organisms such as MAI could result in such an appearance, an additional consideration should include the sequela of remote or recurrent infection and/or aspiration. There is these changes appears stable since prior examination. No new focal pulmonary nodules or infiltrates. No pneumothorax or pleural effusion. Upper Abdomen: Status post cholecystectomy.  No acute abnormality. Musculoskeletal: Multilevel thoracolumbar vertebroplasty has been performed of each visualized level inferior to T4. Stable remote compression fracture of T3. No acute bone abnormality. Review of the MIP images confirms the above findings. IMPRESSION: No pulmonary embolism. Mild coronary artery calcification. Morphologic changes in keeping with pulmonary arterial hypertension. Status post partial right upper lobe, right middle lobe, and left lower lobectomy. Extensive scattered areas of cylindrical and varicoid bronchiectasis with mild tree-in-bud nodularity stable since prior examination. Differential considerations include the sequela of remote or recurrent infection, aspiration, and atypical infection as can be seen with mycobacterial or atypical  fungal infection. Mosaic attenuation of the pulmonary parenchyma in keeping with air trapping secondary to small airways disease. Aortic Atherosclerosis (ICD10-I70.0). Electronically Signed   By: Fidela Salisbury MD   On: 07/30/2020 22:16   CT Angio Chest PE W and/or Wo Contrast  Result Date: 07/14/2020 CLINICAL DATA:  PE suspected, high prob EXAM: CT ANGIOGRAPHY CHEST WITH CONTRAST TECHNIQUE: Multidetector CT imaging of the chest was performed using the standard protocol during bolus administration of intravenous contrast. Multiplanar CT image reconstructions and MIPs were obtained to evaluate the vascular anatomy. CONTRAST:  67m OMNIPAQUE IOHEXOL 350 MG/ML SOLN COMPARISON:  Chest CT 04/29/2020 FINDINGS: Cardiovascular: Satisfactory opacification of the pulmonary arteries to the segmental level. No evidence of pulmonary embolism. Unchanged enlarged central pulmonary arteries compatible with pulmonary hypertension. Unchanged mild cardiomegaly. Atherosclerotic calcifications of the aorta. Coronary artery calcifications. Mediastinum/Nodes: There is no mediastinal, hilar, or axillary lymphadenopathy. Thyroid is unremarkable. There is a large hiatal hernia containing the a hepatic flexure of the colon and the stomach, similar to prior exam. Lungs/Pleura: Diffuse bronchial wall thickening with multifocal bronchiectasis, most severe in the right upper lobe and lung bases bilaterally. Multifocal mucous plugging. Similar architectural distortion in the of the left lower lobe. Prior resections of the lateral segment of the right upper lobe, the right middle lobe, and inferior lingula. There is small right-sided subpleural nodularity, increased from prior exam, largest measuring 2.0 x 0.6 cm, and another measuring 0.9 x 0.8 cm (series 7, images 53 and 41 respectively). Similar subpleural nodularity in the left upper lobe. Scattered lung scarring and tree-in-bud nodularity. Mosaic attenuation bilaterally, likely due to air  trapping. Upper Abdomen: Unchanged left renal cyst. Prior cholecystectomy. No acute abnormality. Musculoskeletal: Unchanged multiple thoracolumbar compression deformities with vertebroplasties. Unchanged chronic T3 compression deformity. Partially visualized severe degenerative changes in the lower cervical spine. Review of the MIP images confirms the above findings. IMPRESSION: No evidence of pulmonary embolism. Unchanged enlarged pulmonary arteries suggestive of pulmonary hypertension. 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 Increased subpleural nodularity on the right, which may represent progression of disease. Chronic mosaic attenuation of the lungs likely due to air trapping. Recommend follow-up chest CT in  3 months and follow-up with pulmonology. Aortic Atherosclerosis (ICD10-I70.0). Electronically Signed   By: Maurine Simmering   On: 07/14/2020 13:33    Wt Readings from Last 3 Encounters:  07/30/20 49.9 kg  07/15/20 52.3 kg  05/02/20 52.9 kg    EKG: Atrial fibrillation with no ischemia  Physical Exam:  Blood pressure 121/75, pulse 76, temperature 98.3 F (36.8 C), temperature source Oral, resp. rate (!) 27, height '5\' 5"'$  (1.651 m), weight 49.9 kg, SpO2 99 %. Body mass index is 18.3 kg/m. General: Well developed, well nourished, in no acute distress. Head: Normocephalic, atraumatic, sclera non-icteric, no xanthomas, nares are without discharge.  Neck: Negative for carotid bruits. JVD not elevated. Lungs: Decreased breath sounds and scattered rhonchi bilaterally. Heart: Irregular irregular rhythm Abdomen: Soft, non-tender, non-distended with normoactive bowel sounds. No hepatomegaly. No rebound/guarding. No obvious abdominal masses. Msk:  Strength and tone appear normal for age. Extremities: No clubbing or cyanosis. No edema.  Distal pedal pulses are 2+ and equal  bilaterally. Neuro: Alert and oriented X 3. No facial asymmetry. No focal deficit. Moves all extremities spontaneously. Psych:  Responds to questions appropriately with a normal affect.     Assessment and Plan  75 year old female with history of Takotsubo syndrome with normal coronary arteries apical hypokinesis, history of paroxysmal atrial fibrillation who presented to emergency room with shortness of breath.  Was noted to have probable atypical pneumonia.  Noted to be in atrial fibrillation with variable but initially rapid ventricular response.  Received Cardizem bolus of 10 mg.  Rate is controlled at present.  Currently on metoprolol succinate at 25 daily, digoxin 125 mcg, and Eliquis at 5 mg twice daily which she is on as an outpatient.  We will continue with this to control her rate and treat her airspace disease.  We will follow with you.  Signed, Teodoro Spray MD 07/31/2020, 8:59 AM Pager: [phone]

## 2020-07-31 NOTE — Care Management (Signed)
Received notification from bedside RN regarding intermittent tachycardia, tachypnea with wheezing, lower abd pain.  Complex presentation, multiple issues.  Plan: Judicious use of narcotics for pain control.  Patient has history of opiod induced constipation PRN oxybutynin for bladder spasms Abx for sepsis PRN cardizem pushes for sustained tachycardia Check CT abd/pelvis for intra-abdominal pathology Discussed with pulmonary  Ralene Muskrat MD

## 2020-07-31 NOTE — Consult Note (Signed)
Pulmonary and Critical Care Medicine          Date: 07/31/2020,   MRN# [id number] Ana Washington 05-14-1945     AdmissionWeight: 49.9 kg                 CurrentWeight: 49.9 kg   Referring physician: Sherrie George FNP   CHIEF COMPLAINT:   Acute exacerbation of bronchiectasis due to COVID19    HISTORY OF PRESENT ILLNESS   This is a very pleasant female with hx of permanent AF, severe bronchiectasis chronically with chronic hypoxemia.  She uses Tobramycin nebulized therapy daily and has been on cipro po in the past for >20 years.  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  She is generally on 3L/min nasal canula and is slowly ambulatory.  Patient came in to hospital due to worsening dyspnea and hypoxemia.    PAST MEDICAL HISTORY   Past Medical History:  Diagnosis Date  . Arthritis   . Atrial fibrillation ([place])   . Bronchiectasis ([place])   . CAD (coronary artery disease) 07/30/2017  . Dumping syndrome   . 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 Family history of adverse reaction to anesthesia    sister [name]  . GERD (gastroesophageal reflux disease)   . Headache    MIGRAINES  . Myocardial infarction ([place]) 2007   Non-STEMI  . [name] (postoperative nausea and vomiting)   . Psoriasis   . PUD (peptic ulcer disease)      SURGICAL HISTORY   Past Surgical History:  Procedure Laterality Date  . BACK SURGERY    . CHOLECYSTECTOMY    . ESOPHAGOGASTRODUODENOSCOPY (EGD) WITH PROPOFOL N/A 03/19/2019   Procedure: ESOPHAGOGASTRODUODENOSCOPY (EGD) WITH PROPOFOL;  Surgeon: [name], MD;  Location: [facility] ENDOSCOPY;  Service: Gastroenterology;  Laterality: N/A;  *Note to anesthesia: Per pt's pulmonologist, if intubating, please extubate to BIPAP.  [name] EYE SURGERY    . FOOT SURGERY    . INTRAMEDULLARY (IM) NAIL INTERTROCHANTERIC Right 09/30/2018   Procedure: INTRAMEDULLARY (IM) NAIL  INTERTROCHANTRIC;  Surgeon: Dereck Leep, MD;  Location: [facility] ORS;  Service: Orthopedics;  Laterality: Right;  . KYPHOPLASTY N/A 07/05/2016   Procedure: KYPHOPLASTY T - 9;  Surgeon: Hessie Knows, MD;  Location: [facility] ORS;  Service: Orthopedics;  Laterality: N/A;  . KYPHOPLASTY N/A 11/29/2017   Procedure: Iona Hansen;  Surgeon: Hessie Knows, MD;  Location: [facility] ORS;  Service: Orthopedics;  Laterality: N/A;  L2 and L3  . KYPHOPLASTY N/A 12/18/2017   Procedure: KYPHOPLASTY L1;  Surgeon: Hessie Knows, MD;  Location: [facility] ORS;  Service: Orthopedics;  Laterality: N/A;  . KYPHOPLASTY N/A 01/05/2018   Procedure: KYPHOPLASTY-T11,T12;  Surgeon: Hessie Knows, MD;  Location: [facility] ORS;  Service: Orthopedics;  Laterality: N/A;  . KYPHOPLASTY N/A 04/05/2018   Procedure: T10 KYPHOPLASTY;  Surgeon: Hessie Knows, MD;  Location: [facility] ORS;  Service: Orthopedics;  Laterality: N/A;  . KYPHOPLASTY N/A 04/12/2018   Procedure: KYPHOPLASTY T7,8;  Surgeon: Hessie Knows, MD;  Location: [facility] ORS;  Service: Orthopedics;  Laterality: N/A;  . KYPHOPLASTY N/A 04/19/2018   Procedure: KYPHOPLASTY T5, T6;  Surgeon: Hessie Knows, MD;  Location: [facility] ORS;  Service: Orthopedics;  Laterality: N/A;  . LUNG SURGERY  1990 and 1996  . THOROCOTOMY WITH LOBECTOMY     LEFT LOWER THORACOTOMY / RIGHT MIDDLE LOBECTOMY     FAMILY HISTORY   Family History  Problem Relation Age of Onset  . Hypertension Mother   .  Hypertension Father      SOCIAL HISTORY   Social History   Tobacco Use  . Smoking status: Never  . Smokeless tobacco: Never  Vaping Use  . Vaping Use: Never used  Substance Use Topics  . Alcohol use: No  . Drug use: No     MEDICATIONS    Home Medication:  Current Outpatient Rx  . Order #: [id number] Class: Historical Med  . Order #: [id number] Class: Historical Med  . Order #: [id number] Class: Historical Med  . Order #: [id number] Class: Historical Med  . Order #: [id number] Class: Historical Med  . Order  #: [id number] Class: Historical Med  . Order #: [id number] Class: Historical Med  . Order #: [id number] Class: Historical Med  . Order #: [id number] Class: Historical Med  . Order #: [id number] Class: Historical Med  . Order #: [id number] Class: Historical Med  . Order #: [id number] Class: Historical Med  . Order #: [id number] Class: Historical Med  . Order #: [id number] Class: No Print  . Order #: [id number] Class: Historical Med  . Order #: [id number] Class: Historical Med  . Order #: [id number] Class: Historical Med  . Order #: [id number] Class: Normal  . Order #: [id number] Class: Historical Med  . Order #: [id number] Class: Historical Med  . Order #: [id number] Class: Normal  . Order #: [id number] Class: Historical Med  . Order #: [id number] Class: Historical Med  . Order #: [id number] Class: Historical Med  . Order #: [id number] Class: Historical Med  . Order #: [id number] Class: Historical Med  . Order #: [id number] Class: No Print  . Order #: [id number] Class: Historical Med    Current Medication:  Current Facility-Administered Medications:  .  acetaminophen (TYLENOL) tablet 650 mg, 650 mg, Oral, Q6H PRN **OR** acetaminophen (TYLENOL) suppository 650 mg, 650 mg, Rectal, Q6H PRN, Mansy, Jan A, MD .  acetylcysteine (MUCOMYST) 20 % nebulizer / oral solution 600 mg, 600 mg, Oral, QPC breakfast, Mansy, Jan A, MD .  acidophilus (RISAQUAD) capsule 1 capsule, 1 capsule, Oral, Daily, Mansy, Jan A, MD .  amLODipine (NORVASC) tablet 2.5 mg, 2.5 mg, Oral, Daily, Mansy, Jan A, MD .  apixaban (ELIQUIS) tablet 5 mg, 5 mg, Oral, BID, Mansy, Jan A, MD, 5 mg at 07/31/20 0020 .  ascorbic acid (VITAMIN C) tablet 1,000 mg, 1,000 mg, Oral, Daily, Mansy, Jan A, MD .  digoxin (LANOXIN) tablet 125 mcg, 125 mcg, Oral, Daily, Mansy, Jan A, MD .  guaiFENesin (MUCINEX) 12 hr tablet 600 mg, 600 mg, Oral, BID, Mansy, Jan A, MD, 600 mg at 07/31/20 0020 .  levalbuterol (XOPENEX) nebulizer solution 1.5 mL, 1.5 mL, Nebulization, Q6H PRN, Mansy, Jan A,  MD .  LORazepam (ATIVAN) tablet 0.5 mg, 0.5 mg, Oral, QHS, Mansy, Jan A, MD, 0.5 mg at 07/31/20 0020 .  magnesium hydroxide (MILK OF MAGNESIA) suspension 30 mL, 30 mL, Oral, Daily PRN, Mansy, Jan A, MD .  ondansetron (ZOFRAN) tablet 4 mg, 4 mg, Oral, Q6H PRN **OR** ondansetron (ZOFRAN) injection 4 mg, 4 mg, Intravenous, Q6H PRN, Mansy, Jan A, MD .  polyethylene glycol (MIRALAX / GLYCOLAX) packet 17 g, 17 g, Oral, Daily PRN, Mansy, Jan A, MD .  potassium chloride (KLOR-CON) CR tablet 10 mEq, 10 mEq, Oral, Daily, Mansy, Jan A, MD .  predniSONE (DELTASONE) tablet 5 mg, 5 mg, Oral, Daily, Mansy, Jan A, MD .  senna (SENOKOT) tablet 8.6-17.2 mg, 1-2 tablet, Oral, Daily PRN, Mansy, Jan A, MD .  spironolactone (ALDACTONE) tablet 25 mg, 25 mg, Oral, Daily, Mansy, Jan A, MD .  traZODone (DESYREL) tablet 25 mg, 25 mg, Oral, QHS PRN, Mansy,  Arvella Merles, MD, 25 mg at 07/31/20 0020  Current Outpatient Medications:  .  acetaminophen (TYLENOL) 500 MG tablet, Take 1,000 mg by mouth every 6 (six) hours as needed for mild pain or headache. , Disp: , Rfl:  .  acetylcysteine (MUCOMYST) 20 % nebulizer solution, Take 4 mLs by nebulization daily., Disp: , Rfl:  .  amLODipine (NORVASC) 2.5 MG tablet, Take 2.5 mg by mouth daily., Disp: , Rfl:  .  apixaban (ELIQUIS) 5 MG TABS tablet, Take 5 mg by mouth 2 (two) times daily. , Disp: , Rfl:  .  Ascorbic Acid (VITAMIN C) 1000 MG tablet, Take 1,000 mg by mouth daily., Disp: , Rfl:  .  azithromycin (ZITHROMAX) 250 MG tablet, Take 250 mg by mouth daily., Disp: , Rfl:  .  COMBIVENT RESPIMAT 20-100 MCG/ACT AERS respimat, 1 puff every 6 (six) hours as needed for wheezing., Disp: , Rfl:  .  digoxin (LANOXIN) 0.125 MG tablet, Take 125 mcg by mouth daily., Disp: , Rfl:  .  ferrous sulfate 325 (65 FE) MG EC tablet, Take 325 mg by mouth daily. , Disp: , Rfl:  .  furosemide (LASIX) 20 MG tablet, Take 20 mg by mouth daily as needed for edema., Disp: , Rfl:  .  gabapentin (NEURONTIN) 300 MG  capsule, Take 300 mg by mouth 4 (four) times daily. , Disp: , Rfl:  .  glycopyrrolate (ROBINUL) 1 MG tablet, Take 1 tablet by mouth 3 (three) times daily., Disp: , Rfl:  .  levalbuterol (XOPENEX) 0.31 MG/3ML nebulizer solution, Inhale 3 mLs into the lungs every 6 (six) hours as needed., Disp: , Rfl:  .  LORazepam (ATIVAN) 0.5 MG tablet, Take 1 tablet (0.5 mg total) by mouth at bedtime., Disp: , Rfl:  .  metoprolol succinate (TOPROL-XL) 25 MG 24 hr tablet, Take 25 mg by mouth at bedtime., Disp: , Rfl:  .  NAC 600 MG CAPS, Take 600 mg by mouth daily after breakfast. , Disp: , Rfl:  .  omeprazole (PRILOSEC) 20 MG capsule, Take 20 mg by mouth daily., Disp: , Rfl:  .  polyethylene glycol (MIRALAX / GLYCOLAX) 17 g packet, Take 17 g by mouth daily as needed for severe constipation., Disp: 30 each, Rfl: 0 .  potassium chloride (K-DUR) 10 MEQ tablet, Take 10 mEq by mouth daily., Disp: , Rfl:  .  Probiotic Product (ALIGN) 4 MG CAPS, Take 4 mg by mouth daily., Disp: , Rfl:  .  senna (SENOKOT) 8.6 MG TABS tablet, Take 2 tablets (17.2 mg total) by mouth at bedtime., Disp: 60 tablet, Rfl: 0 .  spironolactone (ALDACTONE) 25 MG tablet, Take 25 mg by mouth daily., Disp: , Rfl:  .  tobramycin, PF, (TOBI) 300 MG/5ML nebulizer solution, Take 5 mLs by nebulization 2 (two) times daily., Disp: , Rfl:  .  benzonatate (TESSALON) 200 MG capsule, Take 200 mg by mouth 3 (three) times daily as needed. (Patient not taking: Reported on 07/30/2020), Disp: , Rfl:  .  diltiazem (CARDIZEM CD) 180 MG 24 hr capsule, Take 180 mg by mouth daily. (Patient not taking: Reported on 07/30/2020), Disp: , Rfl:  .  losartan (COZAAR) 100 MG tablet, Take 100 mg by mouth daily. (Patient not taking: Reported on 07/30/2020), Disp: , Rfl:  .  metoprolol succinate (TOPROL-XL) 100 MG 24 hr tablet, Take 1 tablet (100 mg total) by mouth daily. (Patient not taking: Reported on 07/30/2020), Disp: , Rfl:  .  predniSONE (DELTASONE) 10 MG tablet, Take 10 mg by  mouth daily. (Patient not taking: Reported on 07/30/2020), Disp: , Rfl:     ALLERGIES   Codeine, Sulfa antibiotics, and Penicillins     REVIEW OF SYSTEMS    Review of Systems:  Gen:  Denies  fever, sweats, chills weigh loss  HEENT: Denies blurred vision, double vision, ear pain, eye pain, hearing loss, nose bleeds, sore throat Cardiac:  No dizziness, chest pain or heaviness, chest tightness,edema Resp:   Denies cough or sputum porduction, shortness of breath,wheezing, hemoptysis,  Gi: Denies swallowing difficulty, stomach pain, nausea or vomiting, diarrhea, constipation, bowel incontinence Gu:  Denies bladder incontinence, burning urine Ext:   Denies Joint pain, stiffness or swelling Skin: Denies  skin rash, easy bruising or bleeding or hives Endoc:  Denies polyuria, polydipsia , polyphagia or weight change Psych:   Denies depression, insomnia or hallucinations   Other:  All other systems negative   VS: BP 121/75   Pulse 76   Temp 98.3 F (36.8 C) (Oral)   Resp (!) 27   Ht '5\' 5"'$  (1.651 m)   Wt 49.9 kg   SpO2 99%   BMI 18.30 kg/m      PHYSICAL EXAM    GENERAL:NAD, no fevers, chills, no weakness no fatigue HEAD: Normocephalic, atraumatic.  EYES: Pupils equal, round, reactive to light. Extraocular muscles intact. No scleral icterus.  MOUTH: Moist mucosal membrane. Dentition intact. No abscess noted.  EAR, NOSE, THROAT: Clear without exudates. No external lesions.  NECK: Supple. No thyromegaly. No nodules. No JVD.  PULMONARY: rhonchi bilaterally  CARDIOVASCULAR: S1 and S2. Regular rate and rhythm. No murmurs, rubs, or gallops. No edema. Pedal pulses 2+ bilaterally.  GASTROINTESTINAL: Soft, nontender, nondistended. No masses. Positive bowel sounds. No hepatosplenomegaly.  MUSCULOSKELETAL: No swelling, clubbing, or edema. Range of motion full in all extremities.  NEUROLOGIC: Cranial nerves II through XII are intact. No gross focal neurological deficits. Sensation  intact. Reflexes intact.  SKIN: No ulceration, lesions, rashes, or cyanosis. Skin warm and dry. Turgor intact.  PSYCHIATRIC: Mood, affect within normal limits. The patient is awake, alert and oriented x 3. Insight, judgment intact.       IMAGING    DG Chest 1 View  Result Date: 07/30/2020 CLINICAL DATA:  COVID diagnosis 5 weeks prior, known metastatic disease, increasing shortness of breath with productive cough EXAM: CHEST  1 VIEW COMPARISON:  Radiograph 07/14/2020, CT 07/14/2020 FINDINGS: Large left diaphragmatic eventration. Some adjacent passive atelectatic changes. Diffuse reticulonodular opacities present throughout both lungs. Postsurgical changes in the left hilum and right upper lung are stable from priors and likely reflecting multiple resections. No pneumothorax. No visible layering effusion. Stable cardiomediastinal contours with a calcified aorta. Multilevel vertebroplasty changes are seen. No acute osseous abnormality or suspicious osseous lesion. The osseous structures appear diffusely demineralized which may limit detection of small or nondisplaced fractures. Degenerative changes in the shoulders, right greater than left with high-riding humeral head suggesting underlying rotator cuff insufficiency. Telemetry leads overlie the chest. IMPRESSION: Diffuse reticulonodular opacities throughout the lungs, can be seen in the setting as atypical mycobacterial infection as suggested previously versus additional acute superimposed infection or inflammation. Large left diaphragmatic eventration. Chronic scarring and architectural distortion with areas of postsurgical changes in the left hilum and right upper lung. Aortic Atherosclerosis (ICD10-I70.0). Multilevel vertebroplasty. Electronically Signed   By: Lovena Le M.D.   On: 07/30/2020 20:34   DG Chest 2 View  Result Date: 07/14/2020 CLINICAL DATA:  75 year old female with shortness of breath and cough. EXAM: CHEST -  2 VIEW COMPARISON:   Chest CT 04/29/2020 and earlier. FINDINGS: Seated AP and lateral views of the chest. Large chronic left diaphragmatic hernia appears stable. Stable cardiac size and mediastinal contours. Bronchiectasis better demonstrated by CT. Chronic staple line in the right upper lobe. Stable coarse bilateral pulmonary interstitial opacity. No pneumothorax, pulmonary edema, or acute pulmonary opacity. Diffuse spinal compression fractures with at least 10 levels previously augmented. Stable visualized osseous structures. Negative visible bowel gas pattern. IMPRESSION: Chronic lung disease with bronchiectasis. Chronic left diaphragmatic hernia. No acute cardiopulmonary abnormality identified. Electronically Signed   By: Genevie Ann M.D.   On: 07/14/2020 11:54   CT Angio Chest PE W and/or Wo Contrast  Result Date: 07/30/2020 CLINICAL DATA:  Subacute COVID pneumonia, cough EXAM: CT ANGIOGRAPHY CHEST WITH CONTRAST TECHNIQUE: Multidetector CT imaging of the chest was performed using the standard protocol during bolus administration of intravenous contrast. Multiplanar CT image reconstructions and MIPs were obtained to evaluate the vascular anatomy. CONTRAST:  88m OMNIPAQUE IOHEXOL 350 MG/ML SOLN COMPARISON:  07/14/2020 FINDINGS: Cardiovascular: There is adequate opacification of the residual pulmonary arterial tree. No intraluminal filling defect to suggest acute pulmonary embolism. The central pulmonary arteries are enlarged in keeping with changes of pulmonary arterial hypertension, unchanged from prior examination. Mild multi-vessel coronary artery calcification. Global cardiac size within normal limits. No pericardial effusion. Moderate atherosclerotic calcification within the abdominal aorta. No aortic aneurysm. Mediastinum/Nodes: No pathologic thoracic adenopathy. The visualized thyroid is unremarkable. Small amount of debris is seen within the esophagus possibly reflecting changes of esophageal dysmotility or gastroesophageal  reflux. A large hiatal hernia is seen extending into the left hemithorax with herniation of the splenic flexure of the colon and nearly the entire stomach into the left hemithorax. There is marked tortuosity of the thoracic aorta which extends into the posterior left hemithorax as result of the a large hernia. Lungs/Pleura: Status post partial right upper lobectomy and right middle lobectomy as well as left lower lobectomy. 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 There is superimposed areas of cylindrical and varicoid bronchiectasis again identified, more focal within the residual right upper lobe and basilar right lower lobe though chronic infection with atypical organisms such as MAI could result in such an appearance, an additional consideration should include the sequela of remote or recurrent infection and/or aspiration. There is these changes appears stable since prior examination. No new focal pulmonary nodules or infiltrates. No pneumothorax or pleural effusion. Upper Abdomen: Status post cholecystectomy.  No acute abnormality. Musculoskeletal: Multilevel thoracolumbar vertebroplasty has been performed of each visualized level inferior to T4. Stable remote compression fracture of T3. No acute bone abnormality. Review of the MIP images confirms the above findings. IMPRESSION: No pulmonary embolism. Mild coronary artery calcification. Morphologic changes in keeping with pulmonary arterial hypertension. Status post partial right upper lobe, right middle lobe, and left lower lobectomy. Extensive scattered areas of cylindrical and varicoid bronchiectasis with mild tree-in-bud nodularity stable since prior examination. Differential considerations include the sequela of remote or recurrent infection, aspiration, and atypical infection as can be seen with mycobacterial or atypical fungal infection. Mosaic attenuation of the  pulmonary parenchyma in keeping with air trapping secondary to small airways disease. Aortic Atherosclerosis (ICD10-I70.0). Electronically Signed   By: AFidela SalisburyMD   On: 07/30/2020 22:16   CT Angio Chest PE W and/or Wo Contrast  Result Date: 07/14/2020 CLINICAL DATA:  PE suspected, high prob EXAM: CT  ANGIOGRAPHY CHEST WITH CONTRAST TECHNIQUE: Multidetector CT imaging of the chest was performed using the standard protocol during bolus administration of intravenous contrast. Multiplanar CT image reconstructions and MIPs were obtained to evaluate the vascular anatomy. CONTRAST:  10m OMNIPAQUE IOHEXOL 350 MG/ML SOLN COMPARISON:  Chest CT 04/29/2020 FINDINGS: Cardiovascular: Satisfactory opacification of the pulmonary arteries to the segmental level. No evidence of pulmonary embolism. Unchanged enlarged central pulmonary arteries compatible with pulmonary hypertension. Unchanged mild cardiomegaly. Atherosclerotic calcifications of the aorta. Coronary artery calcifications. Mediastinum/Nodes: There is no mediastinal, hilar, or axillary lymphadenopathy. Thyroid is unremarkable. There is a large hiatal hernia containing the a hepatic flexure of the colon and the stomach, similar to prior exam. Lungs/Pleura: Diffuse bronchial wall thickening with multifocal bronchiectasis, most severe in the right upper lobe and lung bases bilaterally. Multifocal mucous plugging. Similar architectural distortion in the of the left lower lobe. Prior resections of the lateral segment of the right upper lobe, the right middle lobe, and inferior lingula. There is small right-sided subpleural nodularity, increased from prior exam, largest measuring 2.0 x 0.6 cm, and another measuring 0.9 x 0.8 cm (series 7, images 53 and 41 respectively). Similar subpleural nodularity in the left upper lobe. Scattered lung scarring and tree-in-bud nodularity. Mosaic attenuation bilaterally, likely due to air trapping. Upper Abdomen: Unchanged left  renal cyst. Prior cholecystectomy. No acute abnormality. Musculoskeletal: Unchanged multiple thoracolumbar compression deformities with vertebroplasties. Unchanged chronic T3 compression deformity. Partially visualized severe degenerative changes in the lower cervical spine. Review of the MIP images confirms the above findings. IMPRESSION: No evidence of pulmonary embolism. Unchanged enlarged pulmonary arteries suggestive of pulmonary hypertension. 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 Increased subpleural nodularity on the right, which may represent progression of disease. Chronic mosaic attenuation of the lungs likely due to air trapping. Recommend follow-up chest CT in 3 months and follow-up with pulmonology. Aortic Atherosclerosis (ICD10-I70.0). Electronically Signed   By: JMaurine Simmering  On: 07/14/2020 13:33      ASSESSMENT/PLAN    Acute exacerbation of bronchiectasis  - empirically on zithromax and cefepime for possible sepsis    - agree with current antibiotics    S/p COVID19 pneumonia -mild residual on CT from this admission     Chronic AF    - continue eliquis - no hemoptysis -started amio stopped bb and dig  Appreciate cardiology - Dr FUbaldo Glassing  Acute blood loss anemia  FOBT   - on eliquis   End of life care  - palliative consultation    Thank you for allowing me to participate in the care of this patient.   Patient/Family are satisfied with care plan and all questions have been answered.   This document was prepared using Dragon voice recognition software and may include unintentional dictation errors.     FOttie Glazier M.D.  Division of [place]

## 2020-08-01 DIAGNOSIS — J471 Bronchiectasis with (acute) exacerbation: Secondary | ICD-10-CM | POA: Diagnosis not present

## 2020-08-01 LAB — BASIC METABOLIC PANEL
Anion gap: 8 (ref 5–15)
BUN: 9 mg/dL (ref 8–23)
CO2: 32 mmol/L (ref 22–32)
Calcium: 8.7 mg/dL — ABNORMAL LOW (ref 8.9–10.3)
Chloride: 91 mmol/L — ABNORMAL LOW (ref 98–111)
Creatinine, Ser: 0.42 mg/dL — ABNORMAL LOW (ref 0.44–1.00)
GFR, Estimated: >60 mL/min (ref >60)
Glucose, Bld: 149 mg/dL — ABNORMAL HIGH (ref 70–99)
Potassium: 4 mmol/L (ref 3.5–5.1)
Sodium: 131 mmol/L — ABNORMAL LOW (ref 135–145)

## 2020-08-01 LAB — CBC WITH DIFFERENTIAL/PLATELET
Abs Immature Granulocytes: 0.18 10*3/uL — ABNORMAL HIGH (ref 0.00–0.07)
Basophils Absolute: 0 10*3/uL (ref 0.0–0.1)
Basophils Relative: 0 %
Eosinophils Absolute: 0 10*3/uL (ref 0.0–0.5)
Eosinophils Relative: 0 %
HCT: 27.6 % — ABNORMAL LOW (ref 36.0–46.0)
Hemoglobin: 10 g/dL — ABNORMAL LOW (ref 12.0–15.0)
Immature Granulocytes: 1 %
Lymphocytes Relative: 4 %
Lymphs Abs: 0.6 10*3/uL — ABNORMAL LOW (ref 0.7–4.0)
MCH: 33.7 pg (ref 26.0–34.0)
MCHC: 36.2 g/dL — ABNORMAL HIGH (ref 30.0–36.0)
MCV: 92.9 fL (ref 80.0–100.0)
Monocytes Absolute: 0.6 10*3/uL (ref 0.1–1.0)
Monocytes Relative: 5 %
Neutro Abs: 11.4 10*3/uL — ABNORMAL HIGH (ref 1.7–7.7)
Neutrophils Relative %: 90 %
Platelets: 276 10*3/uL (ref 150–400)
RBC: 2.97 MIL/uL — ABNORMAL LOW (ref 3.87–5.11)
RDW: 12.4 % (ref 11.5–15.5)
WBC: 12.8 10*3/uL — ABNORMAL HIGH (ref 4.0–10.5)
nRBC: 0 % (ref 0.0–0.2)

## 2020-08-01 LAB — PROCALCITONIN: Procalcitonin: <0.1 ng/mL

## 2020-08-01 MED ORDER — POLYETHYLENE GLYCOL 3350 17 G PO PACK
17.0000 g | PACK | Freq: Every day | ORAL | Status: DC
  Administered 2020-08-01 – 2020-08-05 (×5): 17 g via ORAL
  Filled 2020-08-01 (×7): qty 1

## 2020-08-01 MED ORDER — AMIODARONE HCL 200 MG PO TABS
200.0000 mg | ORAL_TABLET | Freq: Two times a day (BID) | ORAL | Status: DC
  Administered 2020-08-01 – 2020-08-07 (×13): 200 mg via ORAL
  Filled 2020-08-01 (×14): qty 1

## 2020-08-01 MED ORDER — APIXABAN 2.5 MG PO TABS
2.5000 mg | ORAL_TABLET | Freq: Two times a day (BID) | ORAL | Status: DC
  Administered 2020-08-01 – 2020-08-07 (×12): 2.5 mg via ORAL
  Filled 2020-08-01 (×12): qty 1

## 2020-08-01 MED ORDER — DIPHENHYDRAMINE HCL 25 MG PO CAPS: 25.0000 mg | ORAL_CAPSULE | Freq: Every evening | ORAL | Status: DC | PRN

## 2020-08-01 MED ORDER — CYANOCOBALAMIN 1000 MCG/ML IJ SOLN
1000.0000 ug | Freq: Once | INTRAMUSCULAR | Status: AC
  Administered 2020-08-01: 1000 ug via INTRAMUSCULAR
  Filled 2020-08-01: qty 1

## 2020-08-01 MED ORDER — DIGOXIN 0.25 MG/ML IJ SOLN
0.2500 mg | Freq: Once | INTRAMUSCULAR | Status: AC
  Administered 2020-08-01: 0.25 mg via INTRAVENOUS
  Filled 2020-08-01: qty 2

## 2020-08-01 MED ORDER — DILTIAZEM HCL 25 MG/5ML IV SOLN
10.0000 mg | Freq: Once | INTRAVENOUS | Status: AC
  Administered 2020-08-01: 10 mg via INTRAVENOUS

## 2020-08-01 MED ORDER — FUROSEMIDE 10 MG/ML IJ SOLN
40.0000 mg | Freq: Every day | INTRAMUSCULAR | Status: DC
  Administered 2020-08-01 – 2020-08-03 (×3): 40 mg via INTRAVENOUS
  Filled 2020-08-01 (×3): qty 4

## 2020-08-01 MED ORDER — METHYLPREDNISOLONE SODIUM SUCC 40 MG IJ SOLR
40.0000 mg | Freq: Every day | INTRAMUSCULAR | Status: DC
  Administered 2020-08-01 – 2020-08-03 (×3): 40 mg via INTRAVENOUS
  Filled 2020-08-01 (×3): qty 1

## 2020-08-01 MED ORDER — ACETAMINOPHEN 325 MG PO TABS
650.0000 mg | ORAL_TABLET | Freq: Every evening | ORAL | Status: DC
  Administered 2020-08-01 – 2020-08-06 (×6): 650 mg via ORAL
  Filled 2020-08-01 (×5): qty 2

## 2020-08-01 MED ORDER — GABAPENTIN 300 MG PO CAPS
300.0000 mg | ORAL_CAPSULE | Freq: Three times a day (TID) | ORAL | Status: DC
  Administered 2020-08-01 – 2020-08-07 (×20): 300 mg via ORAL
  Filled 2020-08-01 (×12): qty 1
  Filled 2020-08-01: qty 3
  Filled 2020-08-01 (×7): qty 1

## 2020-08-01 NOTE — Evaluation (Addendum)
Physical Therapy Evaluation Patient Details Name: Ana Washington MRN: [id number] DOB: [birth year] Today's Date: 08/01/2020   History of Present Illness  Ana Washington is a 75 y.o. 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    Clinical Impression  Pt admitted with above diagnosis. Pt received supine in bed agreeable to PT services. Pt reports her baseline function is household mobility with RW and that she relies on family support and care aids at her home. Pt displays mod-I bed mobility going from supine to seated EOB on 2L/min via [place]. SPO2 sats throughout session 89-94% with all mobility. Seated therex performed. Supervision for STS to RW and amb 20' in room with good step through pattern and no LOB. However pt does require intermittent, short standing rest breaks during ambulation due to fatigue. Pt educated on pursed lip breathing throughout gait to improve SOB. With noted Spo2 sats from 89%-94% once seated. Pt displays good bed mobility and balance with walking but is generally weak and deconditioned. Throughout session pt required Mod VC's for STS and stand to sit transfers for hand placement with RW along with correct sequencing to improve safety with transfers. Pt would benefit from continued PT services to progress walking tolerance and improve SOB with functional mobility to maximize safe d/c to recommended venue below.      Follow Up Recommendations Home health PT    Equipment Recommendations  None recommended by PT    Recommendations for Other Services       Precautions / Restrictions Precautions Precautions: Fall Restrictions Weight Bearing  Restrictions: No      Mobility  Bed Mobility Overal bed mobility: Needs Assistance Bed Mobility: Supine to Sit     Supine to sit: Supervision;HOB elevated     General bed mobility comments: returned to recliner    Transfers Overall transfer level: Needs assistance Equipment used: Rolling walker (2 wheeled) Transfers: Sit to/from Stand Sit to Stand: Supervision            Ambulation/Gait Ambulation/Gait assistance: Min guard Gait Distance (Feet): 20 Feet Assistive device: Rolling walker (2 wheeled) Gait Pattern/deviations: Step-through pattern Gait velocity: WFL      Stairs            Wheelchair Mobility    Modified Rankin (Stroke Patients Only)       Balance Overall balance assessment: Needs assistance Sitting-balance support: Feet supported Sitting balance-Leahy Scale: Good     Standing balance support: Bilateral upper extremity supported;During functional activity Standing balance-Leahy Scale: Fair                               Pertinent Vitals/Pain Pain Assessment: No/denies pain    Home Living Family/patient expects to be discharged to:: Private residence Living Arrangements: Spouse/significant other Available Help at Discharge: Family;Available 24 hours/day;Personal care attendant Type of Home: House Home Access: Level entry     Home Layout: One level Home Equipment: Walker - 2 wheels;Bedside commode;Tub bench;Wheelchair - Education officer, community - power Additional Comments: Her sister lives close and assists, patient helps husband who is disabled    Prior Function Level of Independence: Needs assistance   Gait / Transfers  Assistance Needed: HH amb with RW           Hand Dominance   Dominant Hand: Right    Extremity/Trunk Assessment   Upper Extremity Assessment Upper Extremity Assessment: Generalized weakness    Lower Extremity Assessment Lower Extremity Assessment: Generalized weakness    Cervical / Trunk  Assessment Cervical / Trunk Assessment: Normal  Communication   Communication: No difficulties  Cognition Arousal/Alertness: Awake/alert Behavior During Therapy: WFL for tasks assessed/performed Overall Cognitive Status: Within Functional Limits for tasks assessed                                        General Comments      Exercises General Exercises - Lower Extremity Long Arc Quad: AROM;Strengthening;Both;10 reps Hip Flexion/Marching: AROM;Seated;Both;10 reps Toe Raises: Both;Strengthening;Seated;10 reps Heel Raises: Strengthening;Seated;Both;10 reps   Assessment/Plan    PT Assessment Patient needs continued PT services  PT Problem List Decreased strength;Decreased mobility;Decreased activity tolerance;Cardiopulmonary status limiting activity       PT Treatment Interventions Therapeutic exercise;Gait training;Functional mobility training;Therapeutic activities;Patient/family education;Balance training;Neuromuscular re-education    PT Goals (Current goals can be found in the Care Plan section)  Acute Rehab PT Goals Patient Stated Goal: go home PT Goal Formulation: With patient/family Time For Goal Achievement: 08/15/20 Potential to Achieve Goals: Good    Frequency Min 2X/week   Barriers to discharge        Co-evaluation               AM-PAC PT "6 Clicks" Mobility  Outcome Measure Help needed turning from your back to your side while in a flat bed without using bedrails?: None Help needed moving from lying on your back to sitting on the side of a flat bed without using bedrails?: A Little Help needed moving to and from a bed to a chair (including a wheelchair)?: A Little Help needed standing up from a chair using your arms (e.g., wheelchair or bedside chair)?: A Little Help needed to walk in hospital room?: A Little Help needed climbing 3-5 steps with a railing? : A Little 6 Click Score: 19    End of Session Equipment Utilized During  Treatment: Gait belt;Oxygen Activity Tolerance: Patient tolerated treatment well Patient left: in chair;with call bell/phone within reach;with family/visitor present;with chair alarm set Nurse Communication: Mobility status PT Visit Diagnosis: Muscle weakness (generalized) (M62.81);Difficulty in walking, not elsewhere classified (R26.2)    Time: 5208-0223 PT Time Calculation (min) (ACUTE ONLY): 32 min   Charges:   PT Evaluation $PT Eval Moderate Complexity: 1 Mod PT Treatments $Therapeutic Exercise: 8-22 mins $Therapeutic Activity: 8-22 mins        Jontue Crumpacker M. Fairly IV, PT, DPT Physical Therapist- [facility]  08/01/2020, 4:19 PM

## 2020-08-01 NOTE — Progress Notes (Signed)
Pulmonary and Critical Care Medicine          Date: 08/01/2020,   MRN# [id number] Ana Washington [birth year]     AdmissionWeight: 49.9 kg                 CurrentWeight: 50.1 kg   Referring physician: Sherrie George FNP   CHIEF COMPLAINT:   Acute exacerbation of bronchiectasis due to [place]   This is a very pleasant female with hx of permanent AF, severe bronchiectasis chronically with chronic hypoxemia.  She uses Tobramycin nebulized therapy daily and has been on cipro po in the past for >20 years.  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  She is generally on 3L/min nasal canula and is slowly ambulatory.  Patient came in to hospital due to worsening dyspnea and hypoxemia.   08/01/20- patient seen and evaluated at bedside. She seems improved less labored, she had tachypnea post-prandially only finishing 1/4 meal.  CT Abd showing pleural effusions bilaterally I have ordered lasix today bp is adequate.  H/h is stable but no bm, there is diverticulosis some concern for GIB patient has not had bm though in 24h. FOBT is ordered.  She remains in AF, she reported visual hallucinations post sleeping aid, she has not had digoxin today. Added po amio to reduce volume of infusion. NA is low , changed diet to regular to allow patient to eat more and hopefully improve sodium since shes with episodes of altered mental status.  Long term prognosis is poor unfortunately.   PAST MEDICAL HISTORY   Past Medical History:  Diagnosis Date  . Arthritis   . Atrial fibrillation ([place])   . Bronchiectasis ([place])   . CAD (coronary artery disease) 07/30/2017  . Dumping syndrome   . 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 Family history of adverse reaction to anesthesia    sister [name]  . GERD (gastroesophageal reflux disease)   . Headache    MIGRAINES  . Myocardial infarction ([place]) 2007    Non-STEMI  . [name] (postoperative nausea and vomiting)   . Psoriasis   . PUD (peptic ulcer disease)      SURGICAL HISTORY   Past Surgical History:  Procedure Laterality Date  . BACK SURGERY    . CHOLECYSTECTOMY    . ESOPHAGOGASTRODUODENOSCOPY (EGD) WITH PROPOFOL N/A 03/19/2019   Procedure: ESOPHAGOGASTRODUODENOSCOPY (EGD) WITH PROPOFOL;  Surgeon: [name], MD;  Location: [facility] ENDOSCOPY;  Service: Gastroenterology;  Laterality: N/A;  *Note to anesthesia: Per pt's pulmonologist, if intubating, please extubate to BIPAP.  [name] EYE SURGERY    . FOOT SURGERY    . INTRAMEDULLARY (IM) NAIL INTERTROCHANTERIC Right 09/30/2018   Procedure: INTRAMEDULLARY (IM) NAIL INTERTROCHANTRIC;  Surgeon: Dereck Leep, MD;  Location: [facility] ORS;  Service: Orthopedics;  Laterality: Right;  . KYPHOPLASTY N/A 07/05/2016   Procedure: KYPHOPLASTY T - 9;  Surgeon: Hessie Knows, MD;  Location: [facility] ORS;  Service: Orthopedics;  Laterality: N/A;  . KYPHOPLASTY N/A 11/29/2017   Procedure: Iona Hansen;  Surgeon: Hessie Knows, MD;  Location: [facility] ORS;  Service: Orthopedics;  Laterality: N/A;  L2 and L3  . KYPHOPLASTY N/A 12/18/2017   Procedure: KYPHOPLASTY L1;  Surgeon: Hessie Knows, MD;  Location: [facility] ORS;  Service: Orthopedics;  Laterality: N/A;  . KYPHOPLASTY N/A 01/05/2018   Procedure: KYPHOPLASTY-T11,T12;  Surgeon: Hessie Knows, MD;  Location: [facility] ORS;  Service: Orthopedics;  Laterality: N/A;  .  KYPHOPLASTY N/A 04/05/2018   Procedure: T10 KYPHOPLASTY;  Surgeon: Hessie Knows, MD;  Location: [facility] ORS;  Service: Orthopedics;  Laterality: N/A;  . KYPHOPLASTY N/A 04/12/2018   Procedure: KYPHOPLASTY T7,8;  Surgeon: Hessie Knows, MD;  Location: [facility] ORS;  Service: Orthopedics;  Laterality: N/A;  . KYPHOPLASTY N/A 04/19/2018   Procedure: KYPHOPLASTY T5, T6;  Surgeon: Hessie Knows, MD;  Location: [facility] ORS;  Service: Orthopedics;  Laterality: N/A;  . LUNG SURGERY  1990 and 1996  . THOROCOTOMY WITH LOBECTOMY      LEFT LOWER THORACOTOMY / RIGHT MIDDLE LOBECTOMY     FAMILY HISTORY   Family History  Problem Relation Age of Onset  . Hypertension Mother   . Hypertension Father      SOCIAL HISTORY   Social History   Tobacco Use  . Smoking status: Never  . Smokeless tobacco: Never  Vaping Use  . Vaping Use: Never used  Substance Use Topics  . Alcohol use: No  . Drug use: No     MEDICATIONS    Home Medication:  REM   Current Medication:  Current Facility-Administered Medications:  .  acetaminophen (TYLENOL) tablet 650 mg, 650 mg, Oral, Q6H PRN, 650 mg at 07/31/20 1510 **OR** acetaminophen (TYLENOL) suppository 650 mg, 650 mg, Rectal, Q6H PRN, Mansy, Jan A, MD .  acetaminophen (TYLENOL) tablet 650 mg, 650 mg, Oral, QPM, Fath, Kenneth A, MD .  acetylcysteine (MUCOMYST) 20 % nebulizer / oral solution 600 mg, 600 mg, Oral, QPC breakfast, Mansy, Jan A, MD, 600 mg at 07/31/20 1058 .  acidophilus (RISAQUAD) capsule 1 capsule, 1 capsule, Oral, Daily, Mansy, Jan A, MD, 1 capsule at 08/01/20 0950 .  amiodarone (NEXTERONE PREMIX) 360-4.14 MG/200ML-% (1.8 mg/mL) IV infusion, 30 mg/hr, Intravenous, Continuous, Nazar Kuan, MD, Last Rate: 16.67 mL/hr at 08/01/20 0447, 30 mg/hr at 08/01/20 0447 .  amiodarone (PACERONE) tablet 200 mg, 200 mg, Oral, BID, Kymari Lollis, MD .  apixaban (ELIQUIS) tablet 2.5 mg, 2.5 mg, Oral, BID, Jameson Tormey, MD .  ascorbic acid (VITAMIN C) tablet 1,000 mg, 1,000 mg, Oral, Daily, Mansy, Jan A, MD, 1,000 mg at 08/01/20 0951 .  ceFEPIme (MAXIPIME) 2 g in sodium chloride 0.9 % 100 mL IVPB, 2 g, Intravenous, Q12H, Darnelle Bos, RPH, Last Rate: 200 mL/hr at 08/01/20 1003, 2 g at 08/01/20 1003 .  diltiazem (CARDIZEM) injection 10 mg, 10 mg, Intravenous, Q2H PRN, Sreenath, Sudheer B, MD .  gabapentin (NEURONTIN) capsule 300 mg, 300 mg, Oral, TID, Renda Rolls, RPH, 300 mg at 08/01/20 0951 .  guaiFENesin (MUCINEX) 12 hr tablet 600 mg, 600 mg, Oral, BID, Mansy, Jan  A, MD, 600 mg at 08/01/20 0951 .  levalbuterol (XOPENEX) nebulizer solution 1.5 mL, 1.5 mL, Nebulization, Q4H PRN, Priscella Mann, Sudheer B, MD .  LORazepam (ATIVAN) tablet 0.5 mg, 0.5 mg, Oral, QHS, Mansy, Jan A, MD, 0.5 mg at 08/01/20 0019 .  magnesium hydroxide (MILK OF MAGNESIA) suspension 30 mL, 30 mL, Oral, Daily PRN, Mansy, Jan A, MD .  morphine 2 MG/ML injection 2 mg, 2 mg, Intravenous, Q3H PRN, Priscella Mann, Sudheer B, MD, 2 mg at 07/31/20 1846 .  ondansetron (ZOFRAN) tablet 4 mg, 4 mg, Oral, Q6H PRN **OR** ondansetron (ZOFRAN) injection 4 mg, 4 mg, Intravenous, Q6H PRN, Mansy, Jan A, MD, 4 mg at 07/31/20 1838 .  oxybutynin (DITROPAN) tablet 5 mg, 5 mg, Oral, Q8H PRN, Sreenath, Sudheer B, MD .  oxyCODONE (Oxy IR/ROXICODONE) immediate release tablet 5 mg, 5 mg, Oral, Q4H PRN,  Sreenath, Sudheer B, MD .  polyethylene glycol (MIRALAX / GLYCOLAX) packet 17 g, 17 g, Oral, Daily, Sreenath, Sudheer B, MD, 17 g at 08/01/20 0950 .  potassium chloride (KLOR-CON) CR tablet 10 mEq, 10 mEq, Oral, Daily, Mansy, Jan A, MD, 10 mEq at 08/01/20 0951 .  predniSONE (DELTASONE) tablet 5 mg, 5 mg, Oral, Daily, Mansy, Jan A, MD, 5 mg at 08/01/20 0951 .  senna (SENOKOT) tablet 8.6-17.2 mg, 1-2 tablet, Oral, Daily PRN, Mansy, Jan A, MD .  spironolactone (ALDACTONE) tablet 25 mg, 25 mg, Oral, Daily, Mansy, Jan A, MD, 25 mg at 08/01/20 [id number]    ALLERGIES   Codeine, Sulfa antibiotics, and Penicillins     REVIEW OF SYSTEMS    Review of Systems:  Gen:  Denies  fever, sweats, chills weigh loss  HEENT: Denies blurred vision, double vision, ear pain, eye pain, hearing loss, nose bleeds, sore throat Cardiac:  No dizziness, chest pain or heaviness, chest tightness,edema Resp:   Denies cough or sputum porduction, shortness of breath,wheezing, hemoptysis,  Gi: Denies swallowing difficulty, stomach pain, nausea or vomiting, diarrhea, constipation, bowel incontinence Gu:  Denies bladder incontinence, burning urine Ext:    Denies Joint pain, stiffness or swelling Skin: Denies  skin rash, easy bruising or bleeding or hives Endoc:  Denies polyuria, polydipsia , polyphagia or weight change Psych:   Denies depression, insomnia or hallucinations   Other:  All other systems negative   VS: BP 128/90 (BP Location: Right Arm)   Pulse 97   Temp 97.6 F (36.4 C)   Resp 17   Ht '5\' 5"'$  (1.651 m)   Wt 50.1 kg   SpO2 100%   BMI 18.38 kg/m      PHYSICAL EXAM    GENERAL:NAD, no fevers, chills, no weakness no fatigue HEAD: Normocephalic, atraumatic.  EYES: Pupils equal, round, reactive to light. Extraocular muscles intact. No scleral icterus.  MOUTH: Moist mucosal membrane. Dentition intact. No abscess noted.  EAR, NOSE, THROAT: Clear without exudates. No external lesions.  NECK: Supple. No thyromegaly. No nodules. No JVD.  PULMONARY: rhonchi bilaterally  CARDIOVASCULAR: S1 and S2. Regular rate and rhythm. No murmurs, rubs, or gallops. No edema. Pedal pulses 2+ bilaterally.  GASTROINTESTINAL: Soft, nontender, nondistended. No masses. Positive bowel sounds. No hepatosplenomegaly.  MUSCULOSKELETAL: No swelling, clubbing, or edema. Range of motion full in all extremities.  NEUROLOGIC: Cranial nerves II through XII are intact. No gross focal neurological deficits. Sensation intact. Reflexes intact.  SKIN: No ulceration, lesions, rashes, or cyanosis. Skin warm and dry. Turgor intact.  PSYCHIATRIC: Mood, affect within normal limits. The patient is awake, alert and oriented x 3. Insight, judgment intact.       IMAGING    CT ABDOMEN PELVIS WO CONTRAST  Result Date: 07/31/2020 CLINICAL DATA:  Lower abdominal pain.  Tachycardia and tachypnea. EXAM: CT ABDOMEN AND PELVIS WITHOUT CONTRAST TECHNIQUE: Multidetector CT imaging of the abdomen and pelvis was performed following the standard protocol without IV contrast. COMPARISON:  12/25/2018 and prior CT a chest 07/30/2020 FINDINGS: Lower chest: Stable large hiatal hernia  containing most of the stomach. Extensive cylindrical bronchiectasis, airway thickening, and some airway plugging in the lung bases as shown on the exam from 07/30/2020. A small right pleural effusion is mildly increased from 07/30/2020. Mild cardiomegaly noted along with coronary artery and descending thoracic aortic atherosclerotic calcification. High-density material along the left lung base pleural margin, stable. Hepatobiliary: Cholecystectomy. Prominent common bile duct at 1.3 cm in diameter on image 33 series  5, previously the same on 12/25/2018, this biliary dilatation may represent a physiologic response to cholecystectomy. Pancreas: Unremarkable Spleen: Old granulomatous disease. Adrenals/Urinary Tract: Accentuated density in both renal collecting systems probably left over from recent contrast injection. Stable left mid kidney exophytic lesion compatible with cyst. Contrast medium in the urinary bladder. Adrenal glands unremarkable. Stomach/Bowel: Large hiatal hernia containing the majority of the stomach as well as part of the transverse colon and splenic flexure. No findings of strangulation or obstruction, this herniation is chronic. Sigmoid colon diverticulosis without findings of active diverticulitis. Vascular/Lymphatic: Aortoiliac atherosclerotic vascular disease. Reproductive: Small calcified posterior uterine body fibroid. Other: No supplemental non-categorized findings. Musculoskeletal: Bony demineralization. Deformities from old bilateral pelvic fractures, stable from 12/25/2018. Right hip ORIF. Compression fractures at all lumbar levels and all visualized lower thoracic levels, with vertebral augmentations at all visible lower thoracic levels and at L1, L2, and L3. The lumbar compression fractures at L4 and L5 appear similar to the 12/25/2018 exam. IMPRESSION: 1. A specific cause for patient's lower abdominal pain is not identified. 2. Small right pleural effusion, but increased in size  compared to 07/30/2020. 3. Other imaging findings of potential clinical significance: Large hiatal hernia containing most of the stomach as well as part of the transverse colon. Prominent cylindrical bronchiectasis in both lower lobes with scattered airway plugging. Mild cardiomegaly. Aortic Atherosclerosis (ICD10-I70.0). Coronary atherosclerosis. Stable chronic extrahepatic biliary dilatation, possibly a physiologic response to cholecystectomy. Sigmoid diverticulosis. Small uterine fibroid. Old pelvic fractures. Chronic compression fractures at all visualized thoracic and lumbar levels with vertebral augmentations at all visualized thoracolumbar levels except for L4 and L5. Electronically Signed   By: Van Clines M.D.   On: 07/31/2020 20:56   DG Chest 1 View  Result Date: 07/30/2020 CLINICAL DATA:  COVID diagnosis 5 weeks prior, known metastatic disease, increasing shortness of breath with productive cough EXAM: CHEST  1 VIEW COMPARISON:  Radiograph 07/14/2020, CT 07/14/2020 FINDINGS: Large left diaphragmatic eventration. Some adjacent passive atelectatic changes. Diffuse reticulonodular opacities present throughout both lungs. Postsurgical changes in the left hilum and right upper lung are stable from priors and likely reflecting multiple resections. No pneumothorax. No visible layering effusion. Stable cardiomediastinal contours with a calcified aorta. Multilevel vertebroplasty changes are seen. No acute osseous abnormality or suspicious osseous lesion. The osseous structures appear diffusely demineralized which may limit detection of small or nondisplaced fractures. Degenerative changes in the shoulders, right greater than left with high-riding humeral head suggesting underlying rotator cuff insufficiency. Telemetry leads overlie the chest. IMPRESSION: Diffuse reticulonodular opacities throughout the lungs, can be seen in the setting as atypical mycobacterial infection as suggested previously versus  additional acute superimposed infection or inflammation. Large left diaphragmatic eventration. Chronic scarring and architectural distortion with areas of postsurgical changes in the left hilum and right upper lung. Aortic Atherosclerosis (ICD10-I70.0). Multilevel vertebroplasty. Electronically Signed   By: Lovena Le M.D.   On: 07/30/2020 20:34   DG Chest 2 View  Result Date: 07/14/2020 CLINICAL DATA:  75 year old female with shortness of breath and cough. EXAM: CHEST - 2 VIEW COMPARISON:  Chest CT 04/29/2020 and earlier. FINDINGS: Seated AP and lateral views of the chest. Large chronic left diaphragmatic hernia appears stable. Stable cardiac size and mediastinal contours. Bronchiectasis better demonstrated by CT. Chronic staple line in the right upper lobe. Stable coarse bilateral pulmonary interstitial opacity. No pneumothorax, pulmonary edema, or acute pulmonary opacity. Diffuse spinal compression fractures with at least 10 levels previously augmented. Stable visualized osseous structures. Negative visible bowel  gas pattern. IMPRESSION: Chronic lung disease with bronchiectasis. Chronic left diaphragmatic hernia. No acute cardiopulmonary abnormality identified. Electronically Signed   By: Genevie Ann M.D.   On: 07/14/2020 11:54   CT Angio Chest PE W and/or Wo Contrast  Result Date: 07/30/2020 CLINICAL DATA:  Subacute COVID pneumonia, cough EXAM: CT ANGIOGRAPHY CHEST WITH CONTRAST TECHNIQUE: Multidetector CT imaging of the chest was performed using the standard protocol during bolus administration of intravenous contrast. Multiplanar CT image reconstructions and MIPs were obtained to evaluate the vascular anatomy. CONTRAST:  83m OMNIPAQUE IOHEXOL 350 MG/ML SOLN COMPARISON:  07/14/2020 FINDINGS: Cardiovascular: There is adequate opacification of the residual pulmonary arterial tree. No intraluminal filling defect to suggest acute pulmonary embolism. The central pulmonary arteries are enlarged in keeping with  changes of pulmonary arterial hypertension, unchanged from prior examination. Mild multi-vessel coronary artery calcification. Global cardiac size within normal limits. No pericardial effusion. Moderate atherosclerotic calcification within the abdominal aorta. No aortic aneurysm. Mediastinum/Nodes: No pathologic thoracic adenopathy. The visualized thyroid is unremarkable. Small amount of debris is seen within the esophagus possibly reflecting changes of esophageal dysmotility or gastroesophageal reflux. A large hiatal hernia is seen extending into the left hemithorax with herniation of the splenic flexure of the colon and nearly the entire stomach into the left hemithorax. There is marked tortuosity of the thoracic aorta which extends into the posterior left hemithorax as result of the a large hernia. Lungs/Pleura: Status post partial right upper lobectomy and right middle lobectomy as well as left lower lobectomy. 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 There is superimposed areas of cylindrical and varicoid bronchiectasis again identified, more focal within the residual right upper lobe and basilar right lower lobe though chronic infection with atypical organisms such as MAI could result in such an appearance, an additional consideration should include the sequela of remote or recurrent infection and/or aspiration. There is these changes appears stable since prior examination. No new focal pulmonary nodules or infiltrates. No pneumothorax or pleural effusion. Upper Abdomen: Status post cholecystectomy.  No acute abnormality. Musculoskeletal: Multilevel thoracolumbar vertebroplasty has been performed of each visualized level inferior to T4. Stable remote compression fracture of T3. No acute bone abnormality. Review of the MIP images confirms the above findings. IMPRESSION: No pulmonary embolism. Mild coronary artery  calcification. Morphologic changes in keeping with pulmonary arterial hypertension. Status post partial right upper lobe, right middle lobe, and left lower lobectomy. Extensive scattered areas of cylindrical and varicoid bronchiectasis with mild tree-in-bud nodularity stable since prior examination. Differential considerations include the sequela of remote or recurrent infection, aspiration, and atypical infection as can be seen with mycobacterial or atypical fungal infection. Mosaic attenuation of the pulmonary parenchyma in keeping with air trapping secondary to small airways disease. Aortic Atherosclerosis (ICD10-I70.0). Electronically Signed   By: AFidela SalisburyMD   On: 07/30/2020 22:16   CT Angio Chest PE W and/or Wo Contrast  Result Date: 07/14/2020 CLINICAL DATA:  PE suspected, high prob EXAM: CT ANGIOGRAPHY CHEST WITH CONTRAST TECHNIQUE: Multidetector CT imaging of the chest was performed using the standard protocol during bolus administration of intravenous contrast. Multiplanar CT image reconstructions and MIPs were obtained to evaluate the vascular anatomy. CONTRAST:  746mOMNIPAQUE IOHEXOL 350 MG/ML SOLN COMPARISON:  Chest CT 04/29/2020 FINDINGS: Cardiovascular: Satisfactory opacification of the pulmonary arteries to the segmental level. No evidence of pulmonary embolism. Unchanged enlarged central pulmonary arteries compatible with pulmonary hypertension. Unchanged mild  cardiomegaly. Atherosclerotic calcifications of the aorta. Coronary artery calcifications. Mediastinum/Nodes: There is no mediastinal, hilar, or axillary lymphadenopathy. Thyroid is unremarkable. There is a large hiatal hernia containing the a hepatic flexure of the colon and the stomach, similar to prior exam. Lungs/Pleura: Diffuse bronchial wall thickening with multifocal bronchiectasis, most severe in the right upper lobe and lung bases bilaterally. Multifocal mucous plugging. Similar architectural distortion in the of the left  lower lobe. Prior resections of the lateral segment of the right upper lobe, the right middle lobe, and inferior lingula. There is small right-sided subpleural nodularity, increased from prior exam, largest measuring 2.0 x 0.6 cm, and another measuring 0.9 x 0.8 cm (series 7, images 53 and 41 respectively). Similar subpleural nodularity in the left upper lobe. Scattered lung scarring and tree-in-bud nodularity. Mosaic attenuation bilaterally, likely due to air trapping. Upper Abdomen: Unchanged left renal cyst. Prior cholecystectomy. No acute abnormality. Musculoskeletal: Unchanged multiple thoracolumbar compression deformities with vertebroplasties. Unchanged chronic T3 compression deformity. Partially visualized severe degenerative changes in the lower cervical spine. Review of the MIP images confirms the above findings. IMPRESSION: No evidence of pulmonary embolism. Unchanged enlarged pulmonary arteries suggestive of pulmonary hypertension. 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 Increased subpleural nodularity on the right, which may represent progression of disease. Chronic mosaic attenuation of the lungs likely due to air trapping. Recommend follow-up chest CT in 3 months and follow-up with pulmonology. Aortic Atherosclerosis (ICD10-I70.0). Electronically Signed   By: Maurine Simmering   On: 07/14/2020 13:33      ASSESSMENT/PLAN    Acute exacerbation of bronchiectasis  - empirically on zithromax and cefepime for possible sepsis    - agree with current antibiotics    S/p COVID19 pneumonia -mild residual on CT from this admission     Chronic AF    - continue eliquis - no hemoptysis -started amio stopped bb and dig  Appreciate cardiology - Dr Ubaldo Glassing -reduced eliquis to 2.5 due to diverticulosis on ct abd and reduction in hb -amio po 200 bid transition from gtt  Acute blood  loss anemia  FOBT   - on eliquis - 5 po bid>>2.5 bid   End of life care  - palliative consultation     Thank you for allowing me to participate in the care of this patient.   Patient/Family are satisfied with care plan and all questions have been answered.   This document was prepared using Dragon voice recognition software and may include unintentional dictation errors.     Ottie Glazier, M.D.  Division of [place]

## 2020-08-01 NOTE — Progress Notes (Signed)
Patient Name: Ana Washington Date of Encounter: 08/01/2020  Hospital Problem List     Active Problems:   CAP (community acquired pneumonia)   Bronchiectasis with acute exacerbation [facility])    Patient Profile     75 y.o. female with history of Takotsubo syndrome with normal coronaries and ejection fraction of 45%, history of paroxysmal atrial fibrillation, hypertension who presented to emergency room with shortness of breath and was noted to have diffuse rectal nodular opacities throughout the lungs consistent with atypical mycobacterial infection.  Chest CT revealed, atypical infection consistent as above with mycobacterial or atypical fungal infection as well as aspiration.  Pulmonary embolism.  EKG on admission revealed atrial fibrillation.  Patient states she was taken off of Cardizem at a previous hospitalization.  EKG shows A. fib with controlled ventricular response at present in the 80s to 90s.  Subjective   More alert today.  Complains of anxiety and insomnia.  Inpatient Medications     acetylcysteine  600 mg Oral QPC breakfast   acidophilus  1 capsule Oral Daily   apixaban  5 mg Oral BID   vitamin C  1,000 mg Oral Daily   gabapentin  300 mg Oral TID   guaiFENesin  600 mg Oral BID   LORazepam  0.5 mg Oral QHS   polyethylene glycol  17 g Oral Daily   potassium chloride  10 mEq Oral Daily   predniSONE  5 mg Oral Daily   spironolactone  25 mg Oral Daily    Vital Signs    Vitals:   07/31/20 2200 07/31/20 2249 08/01/20 0432 08/01/20 0830  BP:  (!) 163/93 (!) 138/94 (!) 130/92  Pulse:  (!) 106 66 100  Resp:  '20 20 18  '$ Temp:  98.2 F (36.8 C) 97.6 F (36.4 C) 98.6 F (37 C)  TempSrc:  Oral Oral   SpO2:  97% 100% 99%  Weight: 50.1 kg     Height: '5\' 5"'$  (1.651 m)       Intake/Output Summary (Last 24 hours) at 08/01/2020 1008 Last data filed at 07/31/2020 1608 Gross per 24 hour  Intake 280.43 ml  Output 375 ml  Net -94.57 ml   Filed Weights   07/30/20 2003 07/31/20  2200  Weight: 49.9 kg 50.1 kg    Physical Exam    GEN: Chronically ill appearing.   HEENT: normal.  Neck: Supple, no JVD, carotid bruits, or masses. Cardiac: irr, irr Respiratory:  Respirations regular and unlabored, clear to auscultation bilaterally. GI: Soft, nontender, nondistended, BS + x 4. MS: no deformity or atrophy. Skin: warm and dry, no rash. Neuro:  Strength and sensation are intact. Psych: Normal affect.  Labs    CBC Recent Labs    07/30/20 2035 07/31/20 0436 08/01/20 0521  WBC 17.6* 11.8* 12.8*  NEUTROABS 14.2*  --  11.4*  HGB 11.3* 9.9* 10.0*  HCT 31.9* 29.1* 27.6*  MCV 96.1 99.0 92.9  PLT 309 233 [id number]   Basic Metabolic Panel Recent Labs    07/31/20 0436 08/01/20 0521  NA 131* 131*  K 3.5 4.0  CL 93* 91*  CO2 32 32  GLUCOSE 107* 149*  BUN 6* 9  CREATININE 0.32* 0.42*  CALCIUM 8.1* 8.7*   Liver Function Tests Recent Labs    07/30/20 2035  AST 26  ALT 17  ALKPHOS 43  BILITOT 0.7  PROT 6.0*  ALBUMIN 3.2*   No results for input(s): LIPASE, AMYLASE in the last 72 hours. Cardiac Enzymes No  results for input(s): CKTOTAL, CKMB, CKMBINDEX, TROPONINI in the last 72 hours. BNP No results for input(s): BNP in the last 72 hours. D-Dimer No results for input(s): DDIMER in the last 72 hours. Hemoglobin A1C No results for input(s): HGBA1C in the last 72 hours. Fasting Lipid Panel No results for input(s): CHOL, HDL, LDLCALC, TRIG, CHOLHDL, LDLDIRECT in the last 72 hours. Thyroid Function Tests No results for input(s): TSH, T4TOTAL, T3FREE, THYROIDAB in the last 72 hours.  Invalid input(s): FREET3  Telemetry    Atrial fibrillation with improved ventricular response  ECG       Radiology    CT ABDOMEN PELVIS WO CONTRAST  Result Date: 07/31/2020 CLINICAL DATA:  Lower abdominal pain.  Tachycardia and tachypnea. EXAM: CT ABDOMEN AND PELVIS WITHOUT CONTRAST TECHNIQUE: Multidetector CT imaging of the abdomen and pelvis was performed following  the standard protocol without IV contrast. COMPARISON:  12/25/2018 and prior CT a chest 07/30/2020 FINDINGS: Lower chest: Stable large hiatal hernia containing most of the stomach. Extensive cylindrical bronchiectasis, airway thickening, and some airway plugging in the lung bases as shown on the exam from 07/30/2020. A small right pleural effusion is mildly increased from 07/30/2020. Mild cardiomegaly noted along with coronary artery and descending thoracic aortic atherosclerotic calcification. High-density material along the left lung base pleural margin, stable. Hepatobiliary: Cholecystectomy. Prominent common bile duct at 1.3 cm in diameter on image 33 series 5, previously the same on 12/25/2018, this biliary dilatation may represent a physiologic response to cholecystectomy. Pancreas: Unremarkable Spleen: Old granulomatous disease. Adrenals/Urinary Tract: Accentuated density in both renal collecting systems probably left over from recent contrast injection. Stable left mid kidney exophytic lesion compatible with cyst. Contrast medium in the urinary bladder. Adrenal glands unremarkable. Stomach/Bowel: Large hiatal hernia containing the majority of the stomach as well as part of the transverse colon and splenic flexure. No findings of strangulation or obstruction, this herniation is chronic. Sigmoid colon diverticulosis without findings of active diverticulitis. Vascular/Lymphatic: Aortoiliac atherosclerotic vascular disease. Reproductive: Small calcified posterior uterine body fibroid. Other: No supplemental non-categorized findings. Musculoskeletal: Bony demineralization. Deformities from old bilateral pelvic fractures, stable from 12/25/2018. Right hip ORIF. Compression fractures at all lumbar levels and all visualized lower thoracic levels, with vertebral augmentations at all visible lower thoracic levels and at L1, L2, and L3. The lumbar compression fractures at L4 and L5 appear similar to the 12/25/2018  exam. IMPRESSION: 1. A specific cause for patient's lower abdominal pain is not identified. 2. Small right pleural effusion, but increased in size compared to 07/30/2020. 3. Other imaging findings of potential clinical significance: Large hiatal hernia containing most of the stomach as well as part of the transverse colon. Prominent cylindrical bronchiectasis in both lower lobes with scattered airway plugging. Mild cardiomegaly. Aortic Atherosclerosis (ICD10-I70.0). Coronary atherosclerosis. Stable chronic extrahepatic biliary dilatation, possibly a physiologic response to cholecystectomy. Sigmoid diverticulosis. Small uterine fibroid. Old pelvic fractures. Chronic compression fractures at all visualized thoracic and lumbar levels with vertebral augmentations at all visualized thoracolumbar levels except for L4 and L5. Electronically Signed   By: Van Clines M.D.   On: 07/31/2020 20:56   DG Chest 1 View  Result Date: 07/30/2020 CLINICAL DATA:  COVID diagnosis 5 weeks prior, known metastatic disease, increasing shortness of breath with productive cough EXAM: CHEST  1 VIEW COMPARISON:  Radiograph 07/14/2020, CT 07/14/2020 FINDINGS: Large left diaphragmatic eventration. Some adjacent passive atelectatic changes. Diffuse reticulonodular opacities present throughout both lungs. Postsurgical changes in the left hilum and right upper lung are  stable from priors and likely reflecting multiple resections. No pneumothorax. No visible layering effusion. Stable cardiomediastinal contours with a calcified aorta. Multilevel vertebroplasty changes are seen. No acute osseous abnormality or suspicious osseous lesion. The osseous structures appear diffusely demineralized which may limit detection of small or nondisplaced fractures. Degenerative changes in the shoulders, right greater than left with high-riding humeral head suggesting underlying rotator cuff insufficiency. Telemetry leads overlie the chest. IMPRESSION:  Diffuse reticulonodular opacities throughout the lungs, can be seen in the setting as atypical mycobacterial infection as suggested previously versus additional acute superimposed infection or inflammation. Large left diaphragmatic eventration. Chronic scarring and architectural distortion with areas of postsurgical changes in the left hilum and right upper lung. Aortic Atherosclerosis (ICD10-I70.0). Multilevel vertebroplasty. Electronically Signed   By: Lovena Le M.D.   On: 07/30/2020 20:34   DG Chest 2 View  Result Date: 07/14/2020 CLINICAL DATA:  75 year old female with shortness of breath and cough. EXAM: CHEST - 2 VIEW COMPARISON:  Chest CT 04/29/2020 and earlier. FINDINGS: Seated AP and lateral views of the chest. Large chronic left diaphragmatic hernia appears stable. Stable cardiac size and mediastinal contours. Bronchiectasis better demonstrated by CT. Chronic staple line in the right upper lobe. Stable coarse bilateral pulmonary interstitial opacity. No pneumothorax, pulmonary edema, or acute pulmonary opacity. Diffuse spinal compression fractures with at least 10 levels previously augmented. Stable visualized osseous structures. Negative visible bowel gas pattern. IMPRESSION: Chronic lung disease with bronchiectasis. Chronic left diaphragmatic hernia. No acute cardiopulmonary abnormality identified. Electronically Signed   By: Genevie Ann M.D.   On: 07/14/2020 11:54   CT Angio Chest PE W and/or Wo Contrast  Result Date: 07/30/2020 CLINICAL DATA:  Subacute COVID pneumonia, cough EXAM: CT ANGIOGRAPHY CHEST WITH CONTRAST TECHNIQUE: Multidetector CT imaging of the chest was performed using the standard protocol during bolus administration of intravenous contrast. Multiplanar CT image reconstructions and MIPs were obtained to evaluate the vascular anatomy. CONTRAST:  80m OMNIPAQUE IOHEXOL 350 MG/ML SOLN COMPARISON:  07/14/2020 FINDINGS: Cardiovascular: There is adequate opacification of the residual  pulmonary arterial tree. No intraluminal filling defect to suggest acute pulmonary embolism. The central pulmonary arteries are enlarged in keeping with changes of pulmonary arterial hypertension, unchanged from prior examination. Mild multi-vessel coronary artery calcification. Global cardiac size within normal limits. No pericardial effusion. Moderate atherosclerotic calcification within the abdominal aorta. No aortic aneurysm. Mediastinum/Nodes: No pathologic thoracic adenopathy. The visualized thyroid is unremarkable. Small amount of debris is seen within the esophagus possibly reflecting changes of esophageal dysmotility or gastroesophageal reflux. A large hiatal hernia is seen extending into the left hemithorax with herniation of the splenic flexure of the colon and nearly the entire stomach into the left hemithorax. There is marked tortuosity of the thoracic aorta which extends into the posterior left hemithorax as result of the a large hernia. Lungs/Pleura: Status post partial right upper lobectomy and right middle lobectomy as well as left lower lobectomy. 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 There is superimposed areas of cylindrical and varicoid bronchiectasis again identified, more focal within the residual right upper lobe and basilar right lower lobe though chronic infection with atypical organisms such as MAI could result in such an appearance, an additional consideration should include the sequela of remote or recurrent infection and/or aspiration. There is these changes appears stable since prior examination. No new focal pulmonary nodules or infiltrates. No pneumothorax or pleural effusion. Upper Abdomen: Status post  cholecystectomy.  No acute abnormality. Musculoskeletal: Multilevel thoracolumbar vertebroplasty has been performed of each visualized level inferior to T4. Stable remote compression  fracture of T3. No acute bone abnormality. Review of the MIP images confirms the above findings. IMPRESSION: No pulmonary embolism. Mild coronary artery calcification. Morphologic changes in keeping with pulmonary arterial hypertension. Status post partial right upper lobe, right middle lobe, and left lower lobectomy. Extensive scattered areas of cylindrical and varicoid bronchiectasis with mild tree-in-bud nodularity stable since prior examination. Differential considerations include the sequela of remote or recurrent infection, aspiration, and atypical infection as can be seen with mycobacterial or atypical fungal infection. Mosaic attenuation of the pulmonary parenchyma in keeping with air trapping secondary to small airways disease. Aortic Atherosclerosis (ICD10-I70.0). Electronically Signed   By: Fidela Salisbury MD   On: 07/30/2020 22:16   CT Angio Chest PE W and/or Wo Contrast  Result Date: 07/14/2020 CLINICAL DATA:  PE suspected, high prob EXAM: CT ANGIOGRAPHY CHEST WITH CONTRAST TECHNIQUE: Multidetector CT imaging of the chest was performed using the standard protocol during bolus administration of intravenous contrast. Multiplanar CT image reconstructions and MIPs were obtained to evaluate the vascular anatomy. CONTRAST:  16m OMNIPAQUE IOHEXOL 350 MG/ML SOLN COMPARISON:  Chest CT 04/29/2020 FINDINGS: Cardiovascular: Satisfactory opacification of the pulmonary arteries to the segmental level. No evidence of pulmonary embolism. Unchanged enlarged central pulmonary arteries compatible with pulmonary hypertension. Unchanged mild cardiomegaly. Atherosclerotic calcifications of the aorta. Coronary artery calcifications. Mediastinum/Nodes: There is no mediastinal, hilar, or axillary lymphadenopathy. Thyroid is unremarkable. There is a large hiatal hernia containing the a hepatic flexure of the colon and the stomach, similar to prior exam. Lungs/Pleura: Diffuse bronchial wall thickening with multifocal  bronchiectasis, most severe in the right upper lobe and lung bases bilaterally. Multifocal mucous plugging. Similar architectural distortion in the of the left lower lobe. Prior resections of the lateral segment of the right upper lobe, the right middle lobe, and inferior lingula. There is small right-sided subpleural nodularity, increased from prior exam, largest measuring 2.0 x 0.6 cm, and another measuring 0.9 x 0.8 cm (series 7, images 53 and 41 respectively). Similar subpleural nodularity in the left upper lobe. Scattered lung scarring and tree-in-bud nodularity. Mosaic attenuation bilaterally, likely due to air trapping. Upper Abdomen: Unchanged left renal cyst. Prior cholecystectomy. No acute abnormality. Musculoskeletal: Unchanged multiple thoracolumbar compression deformities with vertebroplasties. Unchanged chronic T3 compression deformity. Partially visualized severe degenerative changes in the lower cervical spine. Review of the MIP images confirms the above findings. IMPRESSION: No evidence of pulmonary embolism. Unchanged enlarged pulmonary arteries suggestive of pulmonary hypertension. 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 Increased subpleural nodularity on the right, which may represent progression of disease. Chronic mosaic attenuation of the lungs likely due to air trapping. Recommend follow-up chest CT in 3 months and follow-up with pulmonology. Aortic Atherosclerosis (ICD10-I70.0). Electronically Signed   By: JMaurine Simmering  On: 07/14/2020 13:33    Assessment & Plan    75year old female with history of Takotsubo syndrome with normal coronary arteries apical hypokinesis, history of paroxysmal atrial fibrillation who presented to emergency room with shortness of breath.  Was noted to have probable atypical pneumonia.  Noted to be in atrial fibrillation with variable but  initially rapid ventricular response.  Received Cardizem bolus of 10 mg.  Rate is controlled at present.  Currently on metoprolol succinate at 25 daily, digoxin 125 mcg, and Eliquis at 5 mg twice daily  which she is on as an outpatient.  1.  Atrial fibrillation-rate is better controlled now that she is on amiodarone.  We will continue with this and follow.  If rate remains controlled will convert to p.o.  Continue with apixaban  2.  Bronchiectasis-appreciate pulmonary input.  We will continue with their recommendations.  Appears somewhat better this morning. Signed, Javier Docker Apryl Brymer MD 08/01/2020, 10:08 AM  Pager: [phone]

## 2020-08-01 NOTE — Progress Notes (Signed)
PROGRESS NOTE    Ana Washington  ION:[id number] DOB: September 05, 1945 DOA: 07/30/2020 PCP: [name], MD   Brief Narrative:   75 y.o. Caucasian female with medical history significant for coronary artery disease, essential hypertension, GERD, as well as history of bronchiectasis and chronic MAC, , chronic respiratory failure (on 2lpm via [place] at home), Takotsubo cardiomyopathy (2015 at [facility]), gastroesophageal reflux disease, hypertension, paroxysmal atrial fibrillation (on Eliquis), generalized anxiety disorder, diastolic congestive heart failure (Echo 03/2020 EF 50-55% with G1DD), with recent COVID 19 infection 16 days ago, who presented to the emergency room with acute onset of persistent cough since then with expectoration of yellowish green sputum and worsening dyspnea today as well as wheezing.  She has been feeling "very very weak".  She was admitted here recently for exacerbation of her bronchiectasis from 6/14-6/20.  She was discharged home on p.o. Levaquin and prednisone.  She finished a course of IV remdesivir when she was here  The patient is well-known to both Dr. Ubaldo Glassing and Dr. Lanney Gins.  I appreciate consultations from both of them.  Clinical presentation is likely mixed combination of exacerbation of bronchiectasis, partial contribution from suspected urinary tract infection and rapid atrial fibrillation.  Patient was in rapid atrial fibrillation on admission.  Initially controlled however occasionally becomes tachycardic and acutely symptomatic.  Responded to IV Cardizem however rate became uncontrolled shortly thereafter.  After pulmonary evaluation was started on amiodarone infusion.  Patient appears to be responding to initial therapy.  Heart rate improved on amiodarone infusion.  Patient was endorsing abdominal pain.  CT abdomen pelvis did not reveal any abnormalities.  Unclear driving force for abdominal pain.  Patient reports a bandlike sensation around the upper  abdomen.   Assessment & Plan:   Active Problems:   CAP (community acquired pneumonia)   Bronchiectasis with acute exacerbation ([place])  Acute exacerbation of bronchiectasis Possible urinary tract infection Sepsis secondary to above Patient has advanced lung disease with frequent readmissions Most recently 2 weeks ago Patient is well-known to pulmonary consultant, recommendations appreciated Plan: Continue IV cefepime Follow blood cultures, no growth to date As needed mucolytic's Continue Xopenex every 4 hours as needed Continue prednisone 5 mg daily Appreciate pulmonary recommendations  Atrial fibrillation with rapid ventricular response Likely triggered by underlying infection and lung disease Home regimen includes digoxin and Toprol-XL Cardizem CD was discontinued on previous admission Patient known to Dr. Ubaldo Glassing Initially responded to Cardizem bolus however became rapid again Started amiodarone gtt. on 7/1, digoxin and metoprolol held Plan: Continue amiodarone infusion his rate is controlled If he remains controlled will convert to p.o. Eliquis for VTE prophylaxis  Essential hypertension Cozaar Toprol  GERD PPI  Anxiety PTA Ativan  Functional decline Frequent readmissions The patient had expressed an interest in engaging hospice services due to decline of function and frequent readmissions.  She would like to avoid being hospitalized in the future.  I agree with her assessment.  We have engaged consultation from both hospice liaison as well as palliative care services.  DVT prophylaxis: Eliquis Code Status: DNR Family Communication: None today.  Offered to call but patient declined Disposition Plan: Status is: Inpatient  Remains inpatient appropriate because:Inpatient level of care appropriate due to severity of illness  Dispo: The patient is from: Home              Anticipated d/c is to:  TBD              Patient currently is not medically stable to  d/c.    Difficult to place patient No  Acute flare of bronchiectasis.  Suspected pneumonia.  Sepsis and rapid atrial fibrillation.  Disposition plan pending.     Level of care: Progressive Cardiac  Consultants:  Cardiology, [facility] Pulmonology  Procedures: None  Antimicrobials:  Cefepime   Subjective: Patient seen and examined.  Resting in bed.  Work of breathing improved.  Heart rate improved.  Patient endorses insomnia and anxiety.  Objective: Vitals:   07/31/20 2200 07/31/20 2249 08/01/20 0432 08/01/20 0830  BP:  (!) 163/93 (!) 138/94 (!) 130/92  Pulse:  (!) 106 66 100  Resp:  _0 Temp:  98.2 F (36.8 C) 97.6 F (36.4 C) 98.6 F (37 C)  TempSrc:  Oral Oral   SpO2:  97% 100% 99%  Weight: 50.1 kg     Height: _1  (1.651 m)       Intake/Output Summary (Last 24 hours) at 08/01/2020 1127 Last data filed at 08/01/2020 1049 Gross per 24 hour  Intake 180.43 ml  Output 975 ml  Net -794.57 ml   Filed Weights   07/30/20 2003 07/31/20 2200  Weight: 49.9 kg 50.1 kg    Examination:  General exam: No apparent distress.  Frail Respiratory system: Coarse crackles bilaterally.  Normal work of breathing.  1 L Cardiovascular system: Regular rate, irregular rhythm, no murmurs, no pedal edema Gastrointestinal system: Abdomen is nondistended, soft and nontender. No organomegaly or masses felt. Normal bowel sounds heard. Central nervous system: Alert and oriented. No focal neurological deficits. Extremities: Symmetric 5 x 5 power. Skin: Pale.  No obvious rashes or lesions Psychiatry: Judgement and insight appear normal. Mood & affect appropriate.     Data Reviewed: I have personally reviewed following labs and imaging studies  CBC: Recent Labs  Lab 07/30/20 2035 07/31/20 0436 08/01/20 0521  WBC 17.6* 11.8* 12.8*  NEUTROABS 14.2*  --  11.4*  HGB 11.3* 9.9* 10.0*  HCT 31.9* 29.1* 27.6*  MCV 96.1 99.0 92.9  PLT 309 233 818   Basic Metabolic Panel: Recent  Labs  Lab 07/30/20 2035 07/31/20 0436 08/01/20 0521  NA 126* 131* 131*  K 3.8 3.5 4.0  CL 84* 93* 91*  CO2 34* 32 32  GLUCOSE 119* 107* 149*  BUN 6* 6* 9  CREATININE 0.42* 0.32* 0.42*  CALCIUM 8.9 8.1* 8.7*   GFR: Estimated Creatinine Clearance: 48.1 mL/min (A) (by C-G formula based on SCr of 0.42 mg/dL (L)). Liver Function Tests: Recent Labs  Lab 07/30/20 2035  AST 26  ALT 17  ALKPHOS 43  BILITOT 0.7  PROT 6.0*  ALBUMIN 3.2*   No results for input(s): LIPASE, AMYLASE in the last 168 hours. No results for input(s): AMMONIA in the last 168 hours. Coagulation Profile: Recent Labs  Lab 07/30/20 2035  INR 1.4*   Cardiac Enzymes: No results for input(s): CKTOTAL, CKMB, CKMBINDEX, TROPONINI in the last 168 hours. BNP (last 3 results) No results for input(s): PROBNP in the last 8760 hours. HbA1C: No results for input(s): HGBA1C in the last 72 hours. CBG: No results for input(s): GLUCAP in the last 168 hours. Lipid Profile: No results for input(s): CHOL, HDL, LDLCALC, TRIG, CHOLHDL, LDLDIRECT in the last 72 hours. Thyroid Function Tests: No results for input(s): TSH, T4TOTAL, FREET4, T3FREE, THYROIDAB in the last 72 hours. Anemia Panel: No results for input(s): VITAMINB12, FOLATE, FERRITIN, TIBC, IRON, RETICCTPCT in the last 72 hours. Sepsis Labs: Recent Labs  Lab 07/30/20 2035 07/31/20 1051 08/01/20  [place]  --  <0.10 <0.10  LATICACIDVEN 1.0  --   --     Recent Results (from the past 240 hour(s))  Blood culture (single)     Status: None (Preliminary result)   Collection Time: 07/30/20  8:36 PM   Specimen: BLOOD  Result Value Ref Range Status   Specimen Description BLOOD BLOOD RIGHT FOREARM  Final   Special Requests   Final    BOTTLES DRAWN AEROBIC AND ANAEROBIC Blood Culture adequate volume   Culture   Final    NO GROWTH 2 DAYS Performed at [facility], [address], [place], [place] [address]    Report Status PENDING  Incomplete   Culture, blood (single)     Status: None (Preliminary result)   Collection Time: 07/30/20  9:31 PM   Specimen: BLOOD  Result Value Ref Range Status   Specimen Description BLOOD BLOOD LEFT FOREARM  Final   Special Requests   Final    BOTTLES DRAWN AEROBIC AND ANAEROBIC Blood Culture adequate volume   Culture   Final    NO GROWTH 2 DAYS Performed at [facility], [address] [place], [place] [address]    Report Status PENDING  Incomplete  CULTURE, BLOOD (ROUTINE X 2) w Reflex to ID Panel     Status: None (Preliminary result)   Collection Time: 07/31/20  9:42 AM   Specimen: BLOOD  Result Value Ref Range Status   Specimen Description BLOOD  LEFT AC  Final   Special Requests   Final    BOTTLES DRAWN AEROBIC AND ANAEROBIC Blood Culture results may not be optimal due to an excessive volume of blood received in culture bottles   Culture   Final    NO GROWTH < 24 HOURS Performed at [facility], [address], [place], [place] [address]    Report Status PENDING  Incomplete  CULTURE, BLOOD (ROUTINE X 2) w Reflex to ID Panel     Status: None (Preliminary result)   Collection Time: 07/31/20 10:27 AM   Specimen: BLOOD  Result Value Ref Range Status   Specimen Description BLOOD L FOREARM  Final   Special Requests   Final    BOTTLES DRAWN AEROBIC AND ANAEROBIC Blood Culture results may not be optimal due to an excessive volume of blood received in culture bottles   Culture   Final    NO GROWTH < 24 HOURS Performed at [facility], [address], [place], [place] [address]    Report Status PENDING  Incomplete         Radiology Studies: CT ABDOMEN PELVIS WO CONTRAST  Result Date: 07/31/2020 CLINICAL DATA:  Lower abdominal pain.  Tachycardia and tachypnea. EXAM: CT ABDOMEN AND PELVIS WITHOUT CONTRAST TECHNIQUE: Multidetector CT imaging of the abdomen and pelvis was performed following the standard protocol without IV contrast. COMPARISON:  12/25/2018  and prior CT a chest 07/30/2020 FINDINGS: Lower chest: Stable large hiatal hernia containing most of the stomach. Extensive cylindrical bronchiectasis, airway thickening, and some airway plugging in the lung bases as shown on the exam from 07/30/2020. A small right pleural effusion is mildly increased from 07/30/2020. Mild cardiomegaly noted along with coronary artery and descending thoracic aortic atherosclerotic calcification. High-density material along the left lung base pleural margin, stable. Hepatobiliary: Cholecystectomy. Prominent common bile duct at 1.3 cm in diameter on image 33 series 5, previously the same on 12/25/2018, this biliary dilatation may represent a physiologic response to cholecystectomy. Pancreas: Unremarkable  Spleen: Old granulomatous disease. Adrenals/Urinary Tract: Accentuated density in both renal collecting systems probably left over from recent contrast injection. Stable left mid kidney exophytic lesion compatible with cyst. Contrast medium in the urinary bladder. Adrenal glands unremarkable. Stomach/Bowel: Large hiatal hernia containing the majority of the stomach as well as part of the transverse colon and splenic flexure. No findings of strangulation or obstruction, this herniation is chronic. Sigmoid colon diverticulosis without findings of active diverticulitis. Vascular/Lymphatic: Aortoiliac atherosclerotic vascular disease. Reproductive: Small calcified posterior uterine body fibroid. Other: No supplemental non-categorized findings. Musculoskeletal: Bony demineralization. Deformities from old bilateral pelvic fractures, stable from 12/25/2018. Right hip ORIF. Compression fractures at all lumbar levels and all visualized lower thoracic levels, with vertebral augmentations at all visible lower thoracic levels and at L1, L2, and L3. The lumbar compression fractures at L4 and L5 appear similar to the 12/25/2018 exam. IMPRESSION: 1. A specific cause for patient's lower abdominal  pain is not identified. 2. Small right pleural effusion, but increased in size compared to 07/30/2020. 3. Other imaging findings of potential clinical significance: Large hiatal hernia containing most of the stomach as well as part of the transverse colon. Prominent cylindrical bronchiectasis in both lower lobes with scattered airway plugging. Mild cardiomegaly. Aortic Atherosclerosis (ICD10-I70.0). Coronary atherosclerosis. Stable chronic extrahepatic biliary dilatation, possibly a physiologic response to cholecystectomy. Sigmoid diverticulosis. Small uterine fibroid. Old pelvic fractures. Chronic compression fractures at all visualized thoracic and lumbar levels with vertebral augmentations at all visualized thoracolumbar levels except for L4 and L5. Electronically Signed   By: Van Clines M.D.   On: 07/31/2020 20:56   DG Chest 1 View  Result Date: 07/30/2020 CLINICAL DATA:  COVID diagnosis 5 weeks prior, known metastatic disease, increasing shortness of breath with productive cough EXAM: CHEST  1 VIEW COMPARISON:  Radiograph 07/14/2020, CT 07/14/2020 FINDINGS: Large left diaphragmatic eventration. Some adjacent passive atelectatic changes. Diffuse reticulonodular opacities present throughout both lungs. Postsurgical changes in the left hilum and right upper lung are stable from priors and likely reflecting multiple resections. No pneumothorax. No visible layering effusion. Stable cardiomediastinal contours with a calcified aorta. Multilevel vertebroplasty changes are seen. No acute osseous abnormality or suspicious osseous lesion. The osseous structures appear diffusely demineralized which may limit detection of small or nondisplaced fractures. Degenerative changes in the shoulders, right greater than left with high-riding humeral head suggesting underlying rotator cuff insufficiency. Telemetry leads overlie the chest. IMPRESSION: Diffuse reticulonodular opacities throughout the lungs, can be seen in  the setting as atypical mycobacterial infection as suggested previously versus additional acute superimposed infection or inflammation. Large left diaphragmatic eventration. Chronic scarring and architectural distortion with areas of postsurgical changes in the left hilum and right upper lung. Aortic Atherosclerosis (ICD10-I70.0). Multilevel vertebroplasty. Electronically Signed   By: Lovena Le M.D.   On: 07/30/2020 20:34   CT Angio Chest PE W and/or Wo Contrast  Result Date: 07/30/2020 CLINICAL DATA:  Subacute COVID pneumonia, cough EXAM: CT ANGIOGRAPHY CHEST WITH CONTRAST TECHNIQUE: Multidetector CT imaging of the chest was performed using the standard protocol during bolus administration of intravenous contrast. Multiplanar CT image reconstructions and MIPs were obtained to evaluate the vascular anatomy. CONTRAST:  55m OMNIPAQUE IOHEXOL 350 MG/ML SOLN COMPARISON:  07/14/2020 FINDINGS: Cardiovascular: There is adequate opacification of the residual pulmonary arterial tree. No intraluminal filling defect to suggest acute pulmonary embolism. The central pulmonary arteries are enlarged in keeping with changes of pulmonary arterial hypertension, unchanged from prior examination. Mild multi-vessel coronary artery calcification. Global cardiac size within  normal limits. No pericardial effusion. Moderate atherosclerotic calcification within the abdominal aorta. No aortic aneurysm. Mediastinum/Nodes: No pathologic thoracic adenopathy. The visualized thyroid is unremarkable. Small amount of debris is seen within the esophagus possibly reflecting changes of esophageal dysmotility or gastroesophageal reflux. A large hiatal hernia is seen extending into the left hemithorax with herniation of the splenic flexure of the colon and nearly the entire stomach into the left hemithorax. There is marked tortuosity of the thoracic aorta which extends into the posterior left hemithorax as result of the a large hernia.  Lungs/Pleura: Status post partial right upper lobectomy and right middle lobectomy as well as left lower lobectomy. 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 There is superimposed areas of cylindrical and varicoid bronchiectasis again identified, more focal within the residual right upper lobe and basilar right lower lobe though chronic infection with atypical organisms such as MAI could result in such an appearance, an additional consideration should include the sequela of remote or recurrent infection and/or aspiration. There is these changes appears stable since prior examination. No new focal pulmonary nodules or infiltrates. No pneumothorax or pleural effusion. Upper Abdomen: Status post cholecystectomy.  No acute abnormality. Musculoskeletal: Multilevel thoracolumbar vertebroplasty has been performed of each visualized level inferior to T4. Stable remote compression fracture of T3. No acute bone abnormality. Review of the MIP images confirms the above findings. IMPRESSION: No pulmonary embolism. Mild coronary artery calcification. Morphologic changes in keeping with pulmonary arterial hypertension. Status post partial right upper lobe, right middle lobe, and left lower lobectomy. Extensive scattered areas of cylindrical and varicoid bronchiectasis with mild tree-in-bud nodularity stable since prior examination. Differential considerations include the sequela of remote or recurrent infection, aspiration, and atypical infection as can be seen with mycobacterial or atypical fungal infection. Mosaic attenuation of the pulmonary parenchyma in keeping with air trapping secondary to small airways disease. Aortic Atherosclerosis (ICD10-I70.0). Electronically Signed   By: Fidela Salisbury MD   On: 07/30/2020 22:16        Scheduled Meds:  acetaminophen  650 mg Oral QPM   acetylcysteine  600 mg Oral QPC breakfast    acidophilus  1 capsule Oral Daily   apixaban  5 mg Oral BID   vitamin C  1,000 mg Oral Daily   gabapentin  300 mg Oral TID   guaiFENesin  600 mg Oral BID   LORazepam  0.5 mg Oral QHS   polyethylene glycol  17 g Oral Daily   potassium chloride  10 mEq Oral Daily   predniSONE  5 mg Oral Daily   spironolactone  25 mg Oral Daily   Continuous Infusions:  amiodarone 30 mg/hr (08/01/20 0447)   ceFEPime (MAXIPIME) IV 2 g (08/01/20 1003)     LOS: 2 days    Time spent: 25 minutes    Sidney Ace, MD [facility] Pager [phone]  If 7PM-7AM, please contact night-coverage 08/01/2020, 11:27 AM

## 2020-08-02 ENCOUNTER — Inpatient Hospital Stay: Payer: Medicare HMO

## 2020-08-02 DIAGNOSIS — J471 Bronchiectasis with (acute) exacerbation: Secondary | ICD-10-CM | POA: Diagnosis not present

## 2020-08-02 LAB — CBC WITH DIFFERENTIAL/PLATELET
Abs Immature Granulocytes: 0.18 10*3/uL — ABNORMAL HIGH (ref 0.00–0.07)
Basophils Absolute: 0 10*3/uL (ref 0.0–0.1)
Basophils Relative: 0 %
Eosinophils Absolute: 0 10*3/uL (ref 0.0–0.5)
Eosinophils Relative: 0 %
HCT: 27.1 % — ABNORMAL LOW (ref 36.0–46.0)
Hemoglobin: 9.6 g/dL — ABNORMAL LOW (ref 12.0–15.0)
Immature Granulocytes: 2 %
Lymphocytes Relative: 6 %
Lymphs Abs: 0.6 10*3/uL — ABNORMAL LOW (ref 0.7–4.0)
MCH: 33.9 pg (ref 26.0–34.0)
MCHC: 35.4 g/dL (ref 30.0–36.0)
MCV: 95.8 fL (ref 80.0–100.0)
Monocytes Absolute: 0.4 10*3/uL (ref 0.1–1.0)
Monocytes Relative: 4 %
Neutro Abs: 9.1 10*3/uL — ABNORMAL HIGH (ref 1.7–7.7)
Neutrophils Relative %: 88 %
Platelets: 252 10*3/uL (ref 150–400)
RBC: 2.83 MIL/uL — ABNORMAL LOW (ref 3.87–5.11)
RDW: 12.7 % (ref 11.5–15.5)
WBC: 10.3 10*3/uL (ref 4.0–10.5)
nRBC: 0 % (ref 0.0–0.2)

## 2020-08-02 LAB — BASIC METABOLIC PANEL
Anion gap: 6 (ref 5–15)
BUN: 12 mg/dL (ref 8–23)
CO2: 33 mmol/L — ABNORMAL HIGH (ref 22–32)
Calcium: 8.8 mg/dL — ABNORMAL LOW (ref 8.9–10.3)
Chloride: 91 mmol/L — ABNORMAL LOW (ref 98–111)
Creatinine, Ser: 0.47 mg/dL (ref 0.44–1.00)
GFR, Estimated: >60 mL/min (ref >60)
Glucose, Bld: 131 mg/dL — ABNORMAL HIGH (ref 70–99)
Potassium: 5 mmol/L (ref 3.5–5.1)
Sodium: 130 mmol/L — ABNORMAL LOW (ref 135–145)

## 2020-08-02 LAB — OCCULT BLOOD X 1 CARD TO LAB, STOOL: Fecal Occult Bld: NEGATIVE

## 2020-08-02 LAB — PROCALCITONIN: Procalcitonin: <0.1 ng/mL

## 2020-08-02 MED ORDER — ACETYLCYSTEINE 20 % IN SOLN
2.0000 mL | Freq: Two times a day (BID) | RESPIRATORY_TRACT | Status: DC
  Administered 2020-08-02: 2 mL via RESPIRATORY_TRACT
  Administered 2020-08-03: 4 mL via RESPIRATORY_TRACT
  Administered 2020-08-03: 2 mL via RESPIRATORY_TRACT
  Administered 2020-08-04: 4 mL via RESPIRATORY_TRACT
  Administered 2020-08-04 – 2020-08-07 (×6): 2 mL via RESPIRATORY_TRACT
  Filled 2020-08-02 (×11): qty 4

## 2020-08-02 MED ORDER — CYANOCOBALAMIN 1000 MCG/ML IJ SOLN
1000.0000 ug | Freq: Once | INTRAMUSCULAR | Status: AC
  Administered 2020-08-02: 1000 ug via INTRAMUSCULAR
  Filled 2020-08-02: qty 1

## 2020-08-02 MED ORDER — ALBUMIN HUMAN 25 % IV SOLN
12.5000 g | Freq: Once | INTRAVENOUS | Status: AC
  Administered 2020-08-02: 12.5 g via INTRAVENOUS
  Filled 2020-08-02: qty 50

## 2020-08-02 MED ORDER — SENNA 8.6 MG PO TABS
2.0000 | ORAL_TABLET | Freq: Every day | ORAL | Status: DC
  Administered 2020-08-03: 8.6 mg via ORAL
  Administered 2020-08-04: 17.2 mg via ORAL
  Filled 2020-08-02 (×5): qty 2

## 2020-08-02 MED ORDER — LEVALBUTEROL HCL 0.63 MG/3ML IN NEBU
0.6300 mg | INHALATION_SOLUTION | Freq: Two times a day (BID) | RESPIRATORY_TRACT | Status: DC
  Administered 2020-08-02 – 2020-08-07 (×11): 0.63 mg via RESPIRATORY_TRACT
  Filled 2020-08-02 (×10): qty 3

## 2020-08-02 MED ORDER — FLEET ENEMA 7-19 GM/118ML RE ENEM: 1.0000 | ENEMA | Freq: Once | RECTAL | Status: DC

## 2020-08-02 NOTE — Progress Notes (Signed)
Patient Name: Ana Washington Date of Encounter: 08/02/2020  Hospital Problem List     Active Problems:   CAP (community acquired pneumonia)   Bronchiectasis with acute exacerbation [facility])    Patient Profile      75 y.o. female with history of Takotsubo syndrome with normal coronaries and ejection fraction of 45%, history of paroxysmal atrial fibrillation, hypertension who presented to emergency room with shortness of breath and was noted to have diffuse rectal nodular opacities throughout the lungs consistent with atypical mycobacterial infection.  Chest CT revealed, atypical infection consistent as above with mycobacterial or atypical fungal infection as well as aspiration.  Pulmonary embolism.  EKG on admission revealed atrial fibrillation.  Patient states she was taken off of Cardizem at a previous hospitalization.   Subjective   Patient states she feels somewhat better today.  Heart rate is still somewhat variable with A. fib between high 90s and 120s.  Very sensitive to activity.  Inpatient Medications     acetaminophen  650 mg Oral QPM   acidophilus  1 capsule Oral Daily   amiodarone  200 mg Oral BID   apixaban  2.5 mg Oral BID   vitamin C  1,000 mg Oral Daily   furosemide  40 mg Intravenous Daily   gabapentin  300 mg Oral TID   guaiFENesin  600 mg Oral BID   LORazepam  0.5 mg Oral QHS   methylPREDNISolone (SOLU-MEDROL) injection  40 mg Intravenous Daily   polyethylene glycol  17 g Oral Daily   potassium chloride  10 mEq Oral Daily   spironolactone  25 mg Oral Daily    Vital Signs    Vitals:   08/01/20 1152 08/01/20 1537 08/01/20 1950 08/02/20 0524  BP: 128/90 (!) 144/96 138/87 110/82  Pulse: 97 71 90 72  Resp: '17 17 20 20  '$ Temp: 97.6 F (36.4 C) (!) 97.5 F (36.4 C) 97.9 F (36.6 C) 97.9 F (36.6 C)  TempSrc:      SpO2: 100% 98% 100% 100%  Weight:      Height:        Intake/Output Summary (Last 24 hours) at 08/02/2020 1030 Last data filed at 08/02/2020  0532 Gross per 24 hour  Intake --  Output 2050 ml  Net -2050 ml   Filed Weights   07/30/20 2003 07/31/20 2200  Weight: 49.9 kg 50.1 kg    Physical Exam    GEN: Well nourished, well developed, in no acute distress.  HEENT: normal.  Neck: Supple, no JVD, carotid bruits, or masses. Cardiac: Irregular irregular Respiratory: Scattered rhonchi bilaterally GI: Soft, nontender, nondistended, BS + x 4. MS: no deformity or atrophy. Skin: warm and dry, no rash. Neuro:  Strength and sensation are intact. Psych: Normal affect.  Labs    CBC Recent Labs    08/01/20 0521 08/02/20 0644  WBC 12.8* 10.3  NEUTROABS 11.4* 9.1*  HGB 10.0* 9.6*  HCT 27.6* 27.1*  MCV 92.9 95.8  PLT 276 [id number]   Basic Metabolic Panel Recent Labs    08/01/20 0521 08/02/20 0644  NA 131* 130*  K 4.0 5.0  CL 91* 91*  CO2 32 33*  GLUCOSE 149* 131*  BUN 9 12  CREATININE 0.42* 0.47  CALCIUM 8.7* 8.8*   Liver Function Tests Recent Labs    07/30/20 2035  AST 26  ALT 17  ALKPHOS 43  BILITOT 0.7  PROT 6.0*  ALBUMIN 3.2*   No results for input(s): LIPASE, AMYLASE in the last  72 hours. Cardiac Enzymes No results for input(s): CKTOTAL, CKMB, CKMBINDEX, TROPONINI in the last 72 hours. BNP No results for input(s): BNP in the last 72 hours. D-Dimer No results for input(s): DDIMER in the last 72 hours. Hemoglobin A1C No results for input(s): HGBA1C in the last 72 hours. Fasting Lipid Panel No results for input(s): CHOL, HDL, LDLCALC, TRIG, CHOLHDL, LDLDIRECT in the last 72 hours. Thyroid Function Tests No results for input(s): TSH, T4TOTAL, T3FREE, THYROIDAB in the last 72 hours.  Invalid input(s): FREET3  Telemetry    Atrial fibrillation with variable but occasionally rapid ventricular response  ECG    A. fib  Radiology    CT ABDOMEN PELVIS WO CONTRAST  Result Date: 07/31/2020 CLINICAL DATA:  Lower abdominal pain.  Tachycardia and tachypnea. EXAM: CT ABDOMEN AND PELVIS WITHOUT CONTRAST  TECHNIQUE: Multidetector CT imaging of the abdomen and pelvis was performed following the standard protocol without IV contrast. COMPARISON:  12/25/2018 and prior CT a chest 07/30/2020 FINDINGS: Lower chest: Stable large hiatal hernia containing most of the stomach. Extensive cylindrical bronchiectasis, airway thickening, and some airway plugging in the lung bases as shown on the exam from 07/30/2020. A small right pleural effusion is mildly increased from 07/30/2020. Mild cardiomegaly noted along with coronary artery and descending thoracic aortic atherosclerotic calcification. High-density material along the left lung base pleural margin, stable. Hepatobiliary: Cholecystectomy. Prominent common bile duct at 1.3 cm in diameter on image 33 series 5, previously the same on 12/25/2018, this biliary dilatation may represent a physiologic response to cholecystectomy. Pancreas: Unremarkable Spleen: Old granulomatous disease. Adrenals/Urinary Tract: Accentuated density in both renal collecting systems probably left over from recent contrast injection. Stable left mid kidney exophytic lesion compatible with cyst. Contrast medium in the urinary bladder. Adrenal glands unremarkable. Stomach/Bowel: Large hiatal hernia containing the majority of the stomach as well as part of the transverse colon and splenic flexure. No findings of strangulation or obstruction, this herniation is chronic. Sigmoid colon diverticulosis without findings of active diverticulitis. Vascular/Lymphatic: Aortoiliac atherosclerotic vascular disease. Reproductive: Small calcified posterior uterine body fibroid. Other: No supplemental non-categorized findings. Musculoskeletal: Bony demineralization. Deformities from old bilateral pelvic fractures, stable from 12/25/2018. Right hip ORIF. Compression fractures at all lumbar levels and all visualized lower thoracic levels, with vertebral augmentations at all visible lower thoracic levels and at L1, L2, and  L3. The lumbar compression fractures at L4 and L5 appear similar to the 12/25/2018 exam. IMPRESSION: 1. A specific cause for patient's lower abdominal pain is not identified. 2. Small right pleural effusion, but increased in size compared to 07/30/2020. 3. Other imaging findings of potential clinical significance: Large hiatal hernia containing most of the stomach as well as part of the transverse colon. Prominent cylindrical bronchiectasis in both lower lobes with scattered airway plugging. Mild cardiomegaly. Aortic Atherosclerosis (ICD10-I70.0). Coronary atherosclerosis. Stable chronic extrahepatic biliary dilatation, possibly a physiologic response to cholecystectomy. Sigmoid diverticulosis. Small uterine fibroid. Old pelvic fractures. Chronic compression fractures at all visualized thoracic and lumbar levels with vertebral augmentations at all visualized thoracolumbar levels except for L4 and L5. Electronically Signed   By: Van Clines M.D.   On: 07/31/2020 20:56   DG Chest 1 View  Result Date: 07/30/2020 CLINICAL DATA:  COVID diagnosis 5 weeks prior, known metastatic disease, increasing shortness of breath with productive cough EXAM: CHEST  1 VIEW COMPARISON:  Radiograph 07/14/2020, CT 07/14/2020 FINDINGS: Large left diaphragmatic eventration. Some adjacent passive atelectatic changes. Diffuse reticulonodular opacities present throughout both lungs. Postsurgical changes in  the left hilum and right upper lung are stable from priors and likely reflecting multiple resections. No pneumothorax. No visible layering effusion. Stable cardiomediastinal contours with a calcified aorta. Multilevel vertebroplasty changes are seen. No acute osseous abnormality or suspicious osseous lesion. The osseous structures appear diffusely demineralized which may limit detection of small or nondisplaced fractures. Degenerative changes in the shoulders, right greater than left with high-riding humeral head suggesting  underlying rotator cuff insufficiency. Telemetry leads overlie the chest. IMPRESSION: Diffuse reticulonodular opacities throughout the lungs, can be seen in the setting as atypical mycobacterial infection as suggested previously versus additional acute superimposed infection or inflammation. Large left diaphragmatic eventration. Chronic scarring and architectural distortion with areas of postsurgical changes in the left hilum and right upper lung. Aortic Atherosclerosis (ICD10-I70.0). Multilevel vertebroplasty. Electronically Signed   By: Lovena Le M.D.   On: 07/30/2020 20:34   DG Chest 2 View  Result Date: 07/14/2020 CLINICAL DATA:  75 year old female with shortness of breath and cough. EXAM: CHEST - 2 VIEW COMPARISON:  Chest CT 04/29/2020 and earlier. FINDINGS: Seated AP and lateral views of the chest. Large chronic left diaphragmatic hernia appears stable. Stable cardiac size and mediastinal contours. Bronchiectasis better demonstrated by CT. Chronic staple line in the right upper lobe. Stable coarse bilateral pulmonary interstitial opacity. No pneumothorax, pulmonary edema, or acute pulmonary opacity. Diffuse spinal compression fractures with at least 10 levels previously augmented. Stable visualized osseous structures. Negative visible bowel gas pattern. IMPRESSION: Chronic lung disease with bronchiectasis. Chronic left diaphragmatic hernia. No acute cardiopulmonary abnormality identified. Electronically Signed   By: Genevie Ann M.D.   On: 07/14/2020 11:54   CT Angio Chest PE W and/or Wo Contrast  Result Date: 07/30/2020 CLINICAL DATA:  Subacute COVID pneumonia, cough EXAM: CT ANGIOGRAPHY CHEST WITH CONTRAST TECHNIQUE: Multidetector CT imaging of the chest was performed using the standard protocol during bolus administration of intravenous contrast. Multiplanar CT image reconstructions and MIPs were obtained to evaluate the vascular anatomy. CONTRAST:  46m OMNIPAQUE IOHEXOL 350 MG/ML SOLN COMPARISON:   07/14/2020 FINDINGS: Cardiovascular: There is adequate opacification of the residual pulmonary arterial tree. No intraluminal filling defect to suggest acute pulmonary embolism. The central pulmonary arteries are enlarged in keeping with changes of pulmonary arterial hypertension, unchanged from prior examination. Mild multi-vessel coronary artery calcification. Global cardiac size within normal limits. No pericardial effusion. Moderate atherosclerotic calcification within the abdominal aorta. No aortic aneurysm. Mediastinum/Nodes: No pathologic thoracic adenopathy. The visualized thyroid is unremarkable. Small amount of debris is seen within the esophagus possibly reflecting changes of esophageal dysmotility or gastroesophageal reflux. A large hiatal hernia is seen extending into the left hemithorax with herniation of the splenic flexure of the colon and nearly the entire stomach into the left hemithorax. There is marked tortuosity of the thoracic aorta which extends into the posterior left hemithorax as result of the a large hernia. Lungs/Pleura: Status post partial right upper lobectomy and right middle lobectomy as well as left lower lobectomy. 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 There is superimposed areas of cylindrical and varicoid bronchiectasis again identified, more focal within the residual right upper lobe and basilar right lower lobe though chronic infection with atypical organisms such as MAI could result in such an appearance, an additional consideration should include the sequela of remote or recurrent infection and/or aspiration. There is these changes appears stable since prior examination. No new focal pulmonary nodules or infiltrates. No  pneumothorax or pleural effusion. Upper Abdomen: Status post cholecystectomy.  No acute abnormality. Musculoskeletal: Multilevel thoracolumbar vertebroplasty has  been performed of each visualized level inferior to T4. Stable remote compression fracture of T3. No acute bone abnormality. Review of the MIP images confirms the above findings. IMPRESSION: No pulmonary embolism. Mild coronary artery calcification. Morphologic changes in keeping with pulmonary arterial hypertension. Status post partial right upper lobe, right middle lobe, and left lower lobectomy. Extensive scattered areas of cylindrical and varicoid bronchiectasis with mild tree-in-bud nodularity stable since prior examination. Differential considerations include the sequela of remote or recurrent infection, aspiration, and atypical infection as can be seen with mycobacterial or atypical fungal infection. Mosaic attenuation of the pulmonary parenchyma in keeping with air trapping secondary to small airways disease. Aortic Atherosclerosis (ICD10-I70.0). Electronically Signed   By: Fidela Salisbury MD   On: 07/30/2020 22:16   CT Angio Chest PE W and/or Wo Contrast  Result Date: 07/14/2020 CLINICAL DATA:  PE suspected, high prob EXAM: CT ANGIOGRAPHY CHEST WITH CONTRAST TECHNIQUE: Multidetector CT imaging of the chest was performed using the standard protocol during bolus administration of intravenous contrast. Multiplanar CT image reconstructions and MIPs were obtained to evaluate the vascular anatomy. CONTRAST:  31m OMNIPAQUE IOHEXOL 350 MG/ML SOLN COMPARISON:  Chest CT 04/29/2020 FINDINGS: Cardiovascular: Satisfactory opacification of the pulmonary arteries to the segmental level. No evidence of pulmonary embolism. Unchanged enlarged central pulmonary arteries compatible with pulmonary hypertension. Unchanged mild cardiomegaly. Atherosclerotic calcifications of the aorta. Coronary artery calcifications. Mediastinum/Nodes: There is no mediastinal, hilar, or axillary lymphadenopathy. Thyroid is unremarkable. There is a large hiatal hernia containing the a hepatic flexure of the colon and the stomach, similar to  prior exam. Lungs/Pleura: Diffuse bronchial wall thickening with multifocal bronchiectasis, most severe in the right upper lobe and lung bases bilaterally. Multifocal mucous plugging. Similar architectural distortion in the of the left lower lobe. Prior resections of the lateral segment of the right upper lobe, the right middle lobe, and inferior lingula. There is small right-sided subpleural nodularity, increased from prior exam, largest measuring 2.0 x 0.6 cm, and another measuring 0.9 x 0.8 cm (series 7, images 53 and 41 respectively). Similar subpleural nodularity in the left upper lobe. Scattered lung scarring and tree-in-bud nodularity. Mosaic attenuation bilaterally, likely due to air trapping. Upper Abdomen: Unchanged left renal cyst. Prior cholecystectomy. No acute abnormality. Musculoskeletal: Unchanged multiple thoracolumbar compression deformities with vertebroplasties. Unchanged chronic T3 compression deformity. Partially visualized severe degenerative changes in the lower cervical spine. Review of the MIP images confirms the above findings. IMPRESSION: No evidence of pulmonary embolism. Unchanged enlarged pulmonary arteries suggestive of pulmonary hypertension. 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 Increased subpleural nodularity on the right, which may represent progression of disease. Chronic mosaic attenuation of the lungs likely due to air trapping. Recommend follow-up chest CT in 3 months and follow-up with pulmonology. Aortic Atherosclerosis (ICD10-I70.0). Electronically Signed   By: JMaurine Simmering  On: 07/14/2020 13:33    Assessment & Plan     75year old female with history of Takotsubo syndrome with normal coronary arteries apical hypokinesis, history of paroxysmal atrial fibrillation who presented to emergency room with shortness of breath.  Was noted to have  probable atypical pneumonia.  Noted to be in atrial fibrillation with variable but initially rapid ventricular response.  Received Cardizem bolus of 10 mg.  Rate is controlled at present.  Currently on metoprolol succinate at 25 daily, digoxin  125 mcg, and Eliquis at 5 mg twice daily which she is on as an outpatient.  1.  Atrial fibrillation-rate is better controlled now that she is on amiodarone.  Agree with converting to 200 mg twice daily p.o.  If rate remains somewhat elevated may transiently increase to 400 twice daily during hospitalization and follow.  Continue with apixaban  2.  Bronchiectasis-appreciate pulmonary input.  We will continue with their recommendations.  Appears somewhat better this morning.  Signed, Javier Docker Anayansi Rundquist MD 08/02/2020, 10:30 AM  Pager: [phone]

## 2020-08-02 NOTE — Progress Notes (Signed)
PROGRESS NOTE    Ana Washington  WJX:[id number] DOB: 1945/06/04 DOA: 07/30/2020 PCP: [name], MD   Brief Narrative:   75 y.o. Caucasian female with medical history significant for coronary artery disease, essential hypertension, GERD, as well as history of bronchiectasis and chronic MAC, , chronic respiratory failure (on 2lpm via [place] at home), Takotsubo cardiomyopathy (2015 at [facility]), gastroesophageal reflux disease, hypertension, paroxysmal atrial fibrillation (on Eliquis), generalized anxiety disorder, diastolic congestive heart failure (Echo 03/2020 EF 50-55% with G1DD), with recent COVID 19 infection 16 days ago, who presented to the emergency room with acute onset of persistent cough since then with expectoration of yellowish green sputum and worsening dyspnea today as well as wheezing.  She has been feeling "very very weak".  She was admitted here recently for exacerbation of her bronchiectasis from 6/14-6/20.  She was discharged home on p.o. Levaquin and prednisone.  She finished a course of IV remdesivir when she was here  The patient is well-known to both Dr. Ubaldo Glassing and Dr. Lanney Gins.  I appreciate consultations from both of them.  Clinical presentation is likely mixed combination of exacerbation of bronchiectasis, partial contribution from suspected urinary tract infection and rapid atrial fibrillation.  Patient was in rapid atrial fibrillation on admission.  Initially controlled however occasionally becomes tachycardic and acutely symptomatic.  Responded to IV Cardizem however rate became uncontrolled shortly thereafter.  After pulmonary evaluation was started on amiodarone infusion.  Patient appears to be responding to initial therapy.  Heart rate improved on amiodarone infusion.  Patient was endorsing abdominal pain.  CT abdomen pelvis did not reveal any abnormalities.  Unclear driving force for abdominal pain.  Patient reports a bandlike sensation around the upper abdomen.  7/3:  Appears to be responding to therapy.  Greatly appreciate pulmonary and cardiac recommendations.  Abdominal pain improved.  No bowel movement reported yet.  All cultures no growth to date.  Reports better sleep after discontinuation of trazodone.   Assessment & Plan:   Active Problems:   CAP (community acquired pneumonia)   Bronchiectasis with acute exacerbation ([place])  Acute exacerbation of bronchiectasis Possible urinary tract infection Sepsis secondary to above Patient has advanced lung disease with frequent readmissions Most recently 2 weeks ago Patient is well-known to pulmonary consultant, recommendations appreciated Plan: Continue IV cefepime Follow blood cultures, no growth to date As needed mucolytic's Continue Xopenex every 4 hours as needed IV steroids per pulmonary recommendations Appreciate pulmonary recommendations  Atrial fibrillation with rapid ventricular response Likely triggered by underlying infection and lung disease Home regimen includes digoxin and Toprol-XL Cardizem CD was discontinued on previous admission Patient known to Dr. Ubaldo Glassing Initially responded to Cardizem bolus however became rapid again Started amiodarone gtt. on 7/1, digoxin and metoprolol held Transition to p.o. amiodarone 7/2 Occasional elevation heart rate Plan: Continue p.o. amiodarone 200 twice daily As needed Cardizem pushes for sustained tachycardia Eliquis for VTE prophylaxis  Essential hypertension Cozaar Toprol  GERD PPI  Anxiety PTA Ativan  Functional decline Frequent readmissions The patient had expressed an interest in engaging hospice services due to decline of function and frequent readmissions.  She would like to avoid being hospitalized in the future.  I agree with her assessment.  We have engaged consultation from both hospice liaison as well as palliative care services. Plan: At time of this note patient would like to continue pushing forward with aggressive  treatment  DVT prophylaxis: Eliquis Code Status: DNR Family Communication: None today.  Offered to call but patient declined Disposition  Plan: Status is: Inpatient  Remains inpatient appropriate because:Inpatient level of care appropriate due to severity of illness  Dispo: The patient is from: Home              Anticipated d/c is to:  TBD              Patient currently is not medically stable to d/c.   Difficult to place patient No  Complex presentation.  Acute flare of bronchiectasis with suspected pneumonia.  Associated with sepsis and rapid atrial fibrillation.  Disposition plan unclear at this time.     Level of care: Progressive Cardiac  Consultants:  Cardiology, [facility] Pulmonology  Procedures: None  Antimicrobials:  Cefepime   Subjective: Patient seen and examined.  Resting comfortably in bed.  Much more comfortable appearing this morning.  Heart rate control improved.  Insomnia resolved  Objective: Vitals:   08/01/20 1152 08/01/20 1537 08/01/20 1950 08/02/20 0524  BP: 128/90 (!) 144/96 138/87 110/82  Pulse: 97 71 90 72  Resp: _0 Temp: 97.6 F (36.4 C) (!) 97.5 F (36.4 C) 97.9 F (36.6 C) 97.9 F (36.6 C)  TempSrc:      SpO2: 100% 98% 100% 100%  Weight:      Height:        Intake/Output Summary (Last 24 hours) at 08/02/2020 1200 Last data filed at 08/02/2020 0532 Gross per 24 hour  Intake --  Output 1450 ml  Net -1450 ml   Filed Weights   07/30/20 2003 07/31/20 2200  Weight: 49.9 kg 50.1 kg    Examination:  General exam: No apparent distress.  Energy level improved today Respiratory system: Coarse crackles bilaterally.  Normal work of breathing.  2 L Cardiovascular system: Regular rate, irregular rhythm, no murmurs Gastrointestinal system: Abdomen is nondistended, soft and nontender. No organomegaly or masses felt. Normal bowel sounds heard. Central nervous system: Alert and oriented. No focal neurological  deficits. Extremities: Symmetric 5 x 5 power. Skin: Pale.  No obvious rashes or lesions Psychiatry: Judgement and insight appear normal. Mood & affect appropriate.     Data Reviewed: I have personally reviewed following labs and imaging studies  CBC: Recent Labs  Lab 07/30/20 2035 07/31/20 0436 08/01/20 0521 08/02/20 0644  WBC 17.6* 11.8* 12.8* 10.3  NEUTROABS 14.2*  --  11.4* 9.1*  HGB 11.3* 9.9* 10.0* 9.6*  HCT 31.9* 29.1* 27.6* 27.1*  MCV 96.1 99.0 92.9 95.8  PLT 309 233 276 656   Basic Metabolic Panel: Recent Labs  Lab 07/30/20 2035 07/31/20 0436 08/01/20 0521 08/02/20 0644  NA 126* 131* 131* 130*  K 3.8 3.5 4.0 5.0  CL 84* 93* 91* 91*  CO2 34* 32 32 33*  GLUCOSE 119* 107* 149* 131*  BUN 6* 6* 9 12  CREATININE 0.42* 0.32* 0.42* 0.47  CALCIUM 8.9 8.1* 8.7* 8.8*   GFR: Estimated Creatinine Clearance: 48.1 mL/min (by C-G formula based on SCr of 0.47 mg/dL). Liver Function Tests: Recent Labs  Lab 07/30/20 2035  AST 26  ALT 17  ALKPHOS 43  BILITOT 0.7  PROT 6.0*  ALBUMIN 3.2*   No results for input(s): LIPASE, AMYLASE in the last 168 hours. No results for input(s): AMMONIA in the last 168 hours. Coagulation Profile: Recent Labs  Lab 07/30/20 2035  INR 1.4*   Cardiac Enzymes: No results for input(s): CKTOTAL, CKMB, CKMBINDEX, TROPONINI in the last 168 hours. BNP (last 3 results) No results for input(s): PROBNP in the last 8760 hours.  HbA1C: No results for input(s): HGBA1C in the last 72 hours. CBG: No results for input(s): GLUCAP in the last 168 hours. Lipid Profile: No results for input(s): CHOL, HDL, LDLCALC, TRIG, CHOLHDL, LDLDIRECT in the last 72 hours. Thyroid Function Tests: No results for input(s): TSH, T4TOTAL, FREET4, T3FREE, THYROIDAB in the last 72 hours. Anemia Panel: No results for input(s): VITAMINB12, FOLATE, FERRITIN, TIBC, IRON, RETICCTPCT in the last 72 hours. Sepsis Labs: Recent Labs  Lab 07/30/20 2035 07/31/20 1051  08/01/20 0521 08/02/20 0644  PROCALCITON  --  <0.10 <0.10 <0.10  LATICACIDVEN 1.0  --   --   --     Recent Results (from the past 240 hour(s))  Blood culture (single)     Status: None (Preliminary result)   Collection Time: 07/30/20  8:36 PM   Specimen: BLOOD  Result Value Ref Range Status   Specimen Description BLOOD BLOOD RIGHT FOREARM  Final   Special Requests   Final    BOTTLES DRAWN AEROBIC AND ANAEROBIC Blood Culture adequate volume   Culture   Final    NO GROWTH 2 DAYS Performed at [facility], [address], [place], [place] [address]    Report Status PENDING  Incomplete  Culture, blood (single)     Status: None (Preliminary result)   Collection Time: 07/30/20  9:31 PM   Specimen: BLOOD  Result Value Ref Range Status   Specimen Description BLOOD BLOOD LEFT FOREARM  Final   Special Requests   Final    BOTTLES DRAWN AEROBIC AND ANAEROBIC Blood Culture adequate volume   Culture   Final    NO GROWTH 2 DAYS Performed at [facility], [address], [place], [place] [address]    Report Status PENDING  Incomplete  CULTURE, BLOOD (ROUTINE X 2) w Reflex to ID Panel     Status: None (Preliminary result)   Collection Time: 07/31/20  9:42 AM   Specimen: BLOOD  Result Value Ref Range Status   Specimen Description BLOOD  LEFT AC  Final   Special Requests   Final    BOTTLES DRAWN AEROBIC AND ANAEROBIC Blood Culture results may not be optimal due to an excessive volume of blood received in culture bottles   Culture   Final    NO GROWTH < 24 HOURS Performed at [facility], [address] [place], [place] [address]    Report Status PENDING  Incomplete  CULTURE, BLOOD (ROUTINE X 2) w Reflex to ID Panel     Status: None (Preliminary result)   Collection Time: 07/31/20 10:27 AM   Specimen: BLOOD  Result Value Ref Range Status   Specimen Description BLOOD L FOREARM  Final   Special Requests   Final    BOTTLES DRAWN AEROBIC AND ANAEROBIC Blood  Culture results may not be optimal due to an excessive volume of blood received in culture bottles   Culture   Final    NO GROWTH < 24 HOURS Performed at [facility], [address] [place], [place] [address]    Report Status PENDING  Incomplete         Radiology Studies: CT ABDOMEN PELVIS WO CONTRAST  Result Date: 07/31/2020 CLINICAL DATA:  Lower abdominal pain.  Tachycardia and tachypnea. EXAM: CT ABDOMEN AND PELVIS WITHOUT CONTRAST TECHNIQUE: Multidetector CT imaging of the abdomen and pelvis was performed following the standard protocol without IV contrast. COMPARISON:  12/25/2018 and prior CT a chest 07/30/2020 FINDINGS: Lower chest: Stable large hiatal hernia containing  most of the stomach. Extensive cylindrical bronchiectasis, airway thickening, and some airway plugging in the lung bases as shown on the exam from 07/30/2020. A small right pleural effusion is mildly increased from 07/30/2020. Mild cardiomegaly noted along with coronary artery and descending thoracic aortic atherosclerotic calcification. High-density material along the left lung base pleural margin, stable. Hepatobiliary: Cholecystectomy. Prominent common bile duct at 1.3 cm in diameter on image 33 series 5, previously the same on 12/25/2018, this biliary dilatation may represent a physiologic response to cholecystectomy. Pancreas: Unremarkable Spleen: Old granulomatous disease. Adrenals/Urinary Tract: Accentuated density in both renal collecting systems probably left over from recent contrast injection. Stable left mid kidney exophytic lesion compatible with cyst. Contrast medium in the urinary bladder. Adrenal glands unremarkable. Stomach/Bowel: Large hiatal hernia containing the majority of the stomach as well as part of the transverse colon and splenic flexure. No findings of strangulation or obstruction, this herniation is chronic. Sigmoid colon diverticulosis without findings of active diverticulitis.  Vascular/Lymphatic: Aortoiliac atherosclerotic vascular disease. Reproductive: Small calcified posterior uterine body fibroid. Other: No supplemental non-categorized findings. Musculoskeletal: Bony demineralization. Deformities from old bilateral pelvic fractures, stable from 12/25/2018. Right hip ORIF. Compression fractures at all lumbar levels and all visualized lower thoracic levels, with vertebral augmentations at all visible lower thoracic levels and at L1, L2, and L3. The lumbar compression fractures at L4 and L5 appear similar to the 12/25/2018 exam. IMPRESSION: 1. A specific cause for patient's lower abdominal pain is not identified. 2. Small right pleural effusion, but increased in size compared to 07/30/2020. 3. Other imaging findings of potential clinical significance: Large hiatal hernia containing most of the stomach as well as part of the transverse colon. Prominent cylindrical bronchiectasis in both lower lobes with scattered airway plugging. Mild cardiomegaly. Aortic Atherosclerosis (ICD10-I70.0). Coronary atherosclerosis. Stable chronic extrahepatic biliary dilatation, possibly a physiologic response to cholecystectomy. Sigmoid diverticulosis. Small uterine fibroid. Old pelvic fractures. Chronic compression fractures at all visualized thoracic and lumbar levels with vertebral augmentations at all visualized thoracolumbar levels except for L4 and L5. Electronically Signed   By: Van Clines M.D.   On: 07/31/2020 20:56        Scheduled Meds:  acetaminophen  650 mg Oral QPM   acidophilus  1 capsule Oral Daily   amiodarone  200 mg Oral BID   apixaban  2.5 mg Oral BID   vitamin C  1,000 mg Oral Daily   cyanocobalamin  1,000 mcg Intramuscular Once   furosemide  40 mg Intravenous Daily   gabapentin  300 mg Oral TID   guaiFENesin  600 mg Oral BID   LORazepam  0.5 mg Oral QHS   methylPREDNISolone (SOLU-MEDROL) injection  40 mg Intravenous Daily   polyethylene glycol  17 g Oral Daily    potassium chloride  10 mEq Oral Daily   sodium phosphate  1 enema Rectal Once   spironolactone  25 mg Oral Daily   Continuous Infusions:  albumin human     ceFEPime (MAXIPIME) IV 2 g (08/02/20 1017)     LOS: 3 days    Time spent: 25 minutes    Sidney Ace, MD [facility] Pager [phone]  If 7PM-7AM, please contact night-coverage 08/02/2020, 12:00 PM

## 2020-08-02 NOTE — Progress Notes (Signed)
Pulmonary and Critical Care Medicine          Date: 08/02/2020,   MRN# [id number] Ana Washington 01-08-1946     AdmissionWeight: 49.9 kg                 CurrentWeight: 50.1 kg   Referring physician: Sherrie George FNP   CHIEF COMPLAINT:   Acute exacerbation of bronchiectasis due to [place]   This is a very pleasant female with hx of permanent AF, severe bronchiectasis chronically with chronic hypoxemia.  She uses Tobramycin nebulized therapy daily and has been on cipro po in the past for >20 years.  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  She is generally on 3L/min nasal canula and is slowly ambulatory.  Patient came in to hospital due to worsening dyspnea and hypoxemia.   08/01/20- patient seen and evaluated at bedside. She seems improved less labored, she had tachypnea post-prandially only finishing 1/4 meal.  CT Abd showing pleural effusions bilaterally I have ordered lasix today bp is adequate.  H/h is stable but no bm, there is diverticulosis some concern for GIB patient has not had bm though in 24h. FOBT is ordered.  She remains in AF, she reported visual hallucinations post sleeping aid, she has not had digoxin today. Added po amio to reduce volume of infusion. NA is low , changed diet to regular to allow patient to eat more and hopefully improve sodium since shes with episodes of altered mental status.  Long term prognosis is poor unfortunately.   08/02/20-  Patient is slighly improved , appreciate cardiology management and [place] collaboration.  She is able to walk around hallway.  Family at bedside we reviewed medical plan. She had big BM said it was very dark concern for GI blood loss, FOBT was ordered but not performed, have reordered FOBT.   PAST MEDICAL HISTORY   Past Medical History:  Diagnosis Date  . Arthritis   . Atrial fibrillation ([place])   .  Bronchiectasis ([place])   . CAD (coronary artery disease) 07/30/2017  . Dumping syndrome   . 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 Family history of adverse reaction to anesthesia    sister [name]  . GERD (gastroesophageal reflux disease)   . Headache    MIGRAINES  . Myocardial infarction ([place]) 2007   Non-STEMI  . [name] (postoperative nausea and vomiting)   . Psoriasis   . PUD (peptic ulcer disease)      SURGICAL HISTORY   Past Surgical History:  Procedure Laterality Date  . BACK SURGERY    . CHOLECYSTECTOMY    . ESOPHAGOGASTRODUODENOSCOPY (EGD) WITH PROPOFOL N/A 03/19/2019   Procedure: ESOPHAGOGASTRODUODENOSCOPY (EGD) WITH PROPOFOL;  Surgeon: [name], MD;  Location: [facility] ENDOSCOPY;  Service: Gastroenterology;  Laterality: N/A;  *Note to anesthesia: Per pt's pulmonologist, if intubating, please extubate to BIPAP.  [name] EYE SURGERY    . FOOT SURGERY    . INTRAMEDULLARY (IM) NAIL INTERTROCHANTERIC Right 09/30/2018   Procedure: INTRAMEDULLARY (IM) NAIL INTERTROCHANTRIC;  Surgeon: Dereck Leep, MD;  Location: [facility] ORS;  Service: Orthopedics;  Laterality: Right;  . KYPHOPLASTY N/A 07/05/2016   Procedure: KYPHOPLASTY T - 9;  Surgeon: Hessie Knows, MD;  Location: [facility] ORS;  Service: Orthopedics;  Laterality: N/A;  . KYPHOPLASTY N/A 11/29/2017   Procedure: Iona Hansen;  Surgeon: Hessie Knows, MD;  Location: [facility] ORS;  Service: Orthopedics;  Laterality: N/A;  L2 and L3  . KYPHOPLASTY N/A 12/18/2017   Procedure: KYPHOPLASTY L1;  Surgeon: Hessie Knows, MD;  Location: [facility] ORS;  Service: Orthopedics;  Laterality: N/A;  . KYPHOPLASTY N/A 01/05/2018   Procedure: KYPHOPLASTY-T11,T12;  Surgeon: Hessie Knows, MD;  Location: [facility] ORS;  Service: Orthopedics;  Laterality: N/A;  . KYPHOPLASTY N/A 04/05/2018   Procedure: T10 KYPHOPLASTY;  Surgeon: Hessie Knows, MD;  Location: [facility] ORS;  Service: Orthopedics;  Laterality: N/A;  . KYPHOPLASTY N/A 04/12/2018   Procedure: KYPHOPLASTY T7,8;   Surgeon: Hessie Knows, MD;  Location: [facility] ORS;  Service: Orthopedics;  Laterality: N/A;  . KYPHOPLASTY N/A 04/19/2018   Procedure: KYPHOPLASTY T5, T6;  Surgeon: Hessie Knows, MD;  Location: [facility] ORS;  Service: Orthopedics;  Laterality: N/A;  . LUNG SURGERY  1990 and 1996  . THOROCOTOMY WITH LOBECTOMY     LEFT LOWER THORACOTOMY / RIGHT MIDDLE LOBECTOMY     FAMILY HISTORY   Family History  Problem Relation Age of Onset  . Hypertension Mother   . Hypertension Father      SOCIAL HISTORY   Social History   Tobacco Use  . Smoking status: Never  . Smokeless tobacco: Never  Vaping Use  . Vaping Use: Never used  Substance Use Topics  . Alcohol use: No  . Drug use: No     MEDICATIONS    Home Medication:  REM   Current Medication:  Current Facility-Administered Medications:  .  acetaminophen (TYLENOL) tablet 650 mg, 650 mg, Oral, Q6H PRN, 650 mg at 07/31/20 1510 **OR** acetaminophen (TYLENOL) suppository 650 mg, 650 mg, Rectal, Q6H PRN, Mansy, Jan A, MD .  acetaminophen (TYLENOL) tablet 650 mg, 650 mg, Oral, QPM, Teodoro Spray, MD, 650 mg at 08/01/20 2146 .  acidophilus (RISAQUAD) capsule 1 capsule, 1 capsule, Oral, Daily, Mansy, Jan A, MD, 1 capsule at 08/02/20 0917 .  albumin human 25 % solution 12.5 g, 12.5 g, Intravenous, Once, Rochester Serpe, MD .  amiodarone (PACERONE) tablet 200 mg, 200 mg, Oral, BID, Lanney Gins, Tristin Vandeusen, MD, 200 mg at 08/02/20 0917 .  apixaban (ELIQUIS) tablet 2.5 mg, 2.5 mg, Oral, BID, Brittane Grudzinski, MD, 2.5 mg at 08/02/20 0917 .  ascorbic acid (VITAMIN C) tablet 1,000 mg, 1,000 mg, Oral, Daily, Mansy, Jan A, MD, 1,000 mg at 08/02/20 0917 .  ceFEPIme (MAXIPIME) 2 g in sodium chloride 0.9 % 100 mL IVPB, 2 g, Intravenous, Q12H, Darnelle Bos, RPH, Last Rate: 200 mL/hr at 08/02/20 1017, 2 g at 08/02/20 1017 .  cyanocobalamin ((VITAMIN B-12)) injection 1,000 mcg, 1,000 mcg, Intramuscular, Once, Airi Copado, MD .  furosemide (LASIX) injection 40  mg, 40 mg, Intravenous, Daily, Ottie Glazier, MD, 40 mg at 08/02/20 0917 .  gabapentin (NEURONTIN) capsule 300 mg, 300 mg, Oral, TID, Renda Rolls, RPH, 300 mg at 08/02/20 0917 .  guaiFENesin (MUCINEX) 12 hr tablet 600 mg, 600 mg, Oral, BID, Mansy, Jan A, MD, 600 mg at 08/02/20 0917 .  levalbuterol (XOPENEX) nebulizer solution 1.5 mL, 1.5 mL, Nebulization, Q4H PRN, Priscella Mann, Sudheer B, MD .  LORazepam (ATIVAN) tablet 0.5 mg, 0.5 mg, Oral, QHS, Mansy, Jan A, MD, 0.5 mg at 08/01/20 2143 .  magnesium hydroxide (MILK OF MAGNESIA) suspension 30 mL, 30 mL, Oral, Daily PRN, Mansy, Jan A, MD .  methylPREDNISolone sodium succinate (SOLU-MEDROL) 40 mg/mL injection 40 mg, 40 mg, Intravenous, Daily, Lanney Gins, Minie Roadcap, MD, 40 mg at 08/02/20 0917 .  oxybutynin (DITROPAN) tablet 5 mg, 5 mg, Oral, Q8H PRN, Sreenath, Sudheer B,  MD .  oxyCODONE (Oxy IR/ROXICODONE) immediate release tablet 5 mg, 5 mg, Oral, Q4H PRN, Sreenath, Sudheer B, MD .  polyethylene glycol (MIRALAX / GLYCOLAX) packet 17 g, 17 g, Oral, Daily, Sreenath, Sudheer B, MD, 17 g at 08/02/20 0917 .  potassium chloride (KLOR-CON) CR tablet 10 mEq, 10 mEq, Oral, Daily, Mansy, Jan A, MD, 10 mEq at 08/02/20 0917 .  senna (SENOKOT) tablet 17.2 mg, 2 tablet, Oral, QHS, Nephi Savage, MD .  spironolactone (ALDACTONE) tablet 25 mg, 25 mg, Oral, Daily, Mansy, Jan A, MD, 25 mg at 08/02/20 [id number]    ALLERGIES   Codeine, Sulfa antibiotics, and Penicillins     REVIEW OF SYSTEMS    Review of Systems:  Gen:  Denies  fever, sweats, chills weigh loss  HEENT: Denies blurred vision, double vision, ear pain, eye pain, hearing loss, nose bleeds, sore throat Cardiac:  No dizziness, chest pain or heaviness, chest tightness,edema Resp:   Denies cough or sputum porduction, shortness of breath,wheezing, hemoptysis,  Gi: Denies swallowing difficulty, stomach pain, nausea or vomiting, diarrhea, constipation, bowel incontinence Gu:  Denies bladder incontinence,  burning urine Ext:   Denies Joint pain, stiffness or swelling Skin: Denies  skin rash, easy bruising or bleeding or hives Endoc:  Denies polyuria, polydipsia , polyphagia or weight change Psych:   Denies depression, insomnia or hallucinations   Other:  All other systems negative   VS: BP 110/82 (BP Location: Right Arm)   Pulse 72   Temp 97.9 F (36.6 C)   Resp 20   Ht '5\' 5"'$  (1.651 m)   Wt 50.1 kg   SpO2 100%   BMI 18.38 kg/m      PHYSICAL EXAM    GENERAL:NAD, no fevers, chills, no weakness no fatigue HEAD: Normocephalic, atraumatic.  EYES: Pupils equal, round, reactive to light. Extraocular muscles intact. No scleral icterus.  MOUTH: Moist mucosal membrane. Dentition intact. No abscess noted.  EAR, NOSE, THROAT: Clear without exudates. No external lesions.  NECK: Supple. No thyromegaly. No nodules. No JVD.  PULMONARY: rhonchi bilaterally  CARDIOVASCULAR: S1 and S2. Regular rate and rhythm. No murmurs, rubs, or gallops. No edema. Pedal pulses 2+ bilaterally.  GASTROINTESTINAL: Soft, nontender, nondistended. No masses. Positive bowel sounds. No hepatosplenomegaly.  MUSCULOSKELETAL: No swelling, clubbing, or edema. Range of motion full in all extremities.  NEUROLOGIC: Cranial nerves II through XII are intact. No gross focal neurological deficits. Sensation intact. Reflexes intact.  SKIN: No ulceration, lesions, rashes, or cyanosis. Skin warm and dry. Turgor intact.  PSYCHIATRIC: Mood, affect within normal limits. The patient is awake, alert and oriented x 3. Insight, judgment intact.       IMAGING    CT ABDOMEN PELVIS WO CONTRAST  Result Date: 07/31/2020 CLINICAL DATA:  Lower abdominal pain.  Tachycardia and tachypnea. EXAM: CT ABDOMEN AND PELVIS WITHOUT CONTRAST TECHNIQUE: Multidetector CT imaging of the abdomen and pelvis was performed following the standard protocol without IV contrast. COMPARISON:  12/25/2018 and prior CT a chest 07/30/2020 FINDINGS: Lower chest: Stable  large hiatal hernia containing most of the stomach. Extensive cylindrical bronchiectasis, airway thickening, and some airway plugging in the lung bases as shown on the exam from 07/30/2020. A small right pleural effusion is mildly increased from 07/30/2020. Mild cardiomegaly noted along with coronary artery and descending thoracic aortic atherosclerotic calcification. High-density material along the left lung base pleural margin, stable. Hepatobiliary: Cholecystectomy. Prominent common bile duct at 1.3 cm in diameter on image 33 series 5, previously the same on 12/25/2018,  this biliary dilatation may represent a physiologic response to cholecystectomy. Pancreas: Unremarkable Spleen: Old granulomatous disease. Adrenals/Urinary Tract: Accentuated density in both renal collecting systems probably left over from recent contrast injection. Stable left mid kidney exophytic lesion compatible with cyst. Contrast medium in the urinary bladder. Adrenal glands unremarkable. Stomach/Bowel: Large hiatal hernia containing the majority of the stomach as well as part of the transverse colon and splenic flexure. No findings of strangulation or obstruction, this herniation is chronic. Sigmoid colon diverticulosis without findings of active diverticulitis. Vascular/Lymphatic: Aortoiliac atherosclerotic vascular disease. Reproductive: Small calcified posterior uterine body fibroid. Other: No supplemental non-categorized findings. Musculoskeletal: Bony demineralization. Deformities from old bilateral pelvic fractures, stable from 12/25/2018. Right hip ORIF. Compression fractures at all lumbar levels and all visualized lower thoracic levels, with vertebral augmentations at all visible lower thoracic levels and at L1, L2, and L3. The lumbar compression fractures at L4 and L5 appear similar to the 12/25/2018 exam. IMPRESSION: 1. A specific cause for patient's lower abdominal pain is not identified. 2. Small right pleural effusion, but  increased in size compared to 07/30/2020. 3. Other imaging findings of potential clinical significance: Large hiatal hernia containing most of the stomach as well as part of the transverse colon. Prominent cylindrical bronchiectasis in both lower lobes with scattered airway plugging. Mild cardiomegaly. Aortic Atherosclerosis (ICD10-I70.0). Coronary atherosclerosis. Stable chronic extrahepatic biliary dilatation, possibly a physiologic response to cholecystectomy. Sigmoid diverticulosis. Small uterine fibroid. Old pelvic fractures. Chronic compression fractures at all visualized thoracic and lumbar levels with vertebral augmentations at all visualized thoracolumbar levels except for L4 and L5. Electronically Signed   By: Van Clines M.D.   On: 07/31/2020 20:56   DG Chest 1 View  Result Date: 07/30/2020 CLINICAL DATA:  COVID diagnosis 5 weeks prior, known metastatic disease, increasing shortness of breath with productive cough EXAM: CHEST  1 VIEW COMPARISON:  Radiograph 07/14/2020, CT 07/14/2020 FINDINGS: Large left diaphragmatic eventration. Some adjacent passive atelectatic changes. Diffuse reticulonodular opacities present throughout both lungs. Postsurgical changes in the left hilum and right upper lung are stable from priors and likely reflecting multiple resections. No pneumothorax. No visible layering effusion. Stable cardiomediastinal contours with a calcified aorta. Multilevel vertebroplasty changes are seen. No acute osseous abnormality or suspicious osseous lesion. The osseous structures appear diffusely demineralized which may limit detection of small or nondisplaced fractures. Degenerative changes in the shoulders, right greater than left with high-riding humeral head suggesting underlying rotator cuff insufficiency. Telemetry leads overlie the chest. IMPRESSION: Diffuse reticulonodular opacities throughout the lungs, can be seen in the setting as atypical mycobacterial infection as suggested  previously versus additional acute superimposed infection or inflammation. Large left diaphragmatic eventration. Chronic scarring and architectural distortion with areas of postsurgical changes in the left hilum and right upper lung. Aortic Atherosclerosis (ICD10-I70.0). Multilevel vertebroplasty. Electronically Signed   By: Lovena Le M.D.   On: 07/30/2020 20:34   DG Chest 2 View  Result Date: 07/14/2020 CLINICAL DATA:  75 year old female with shortness of breath and cough. EXAM: CHEST - 2 VIEW COMPARISON:  Chest CT 04/29/2020 and earlier. FINDINGS: Seated AP and lateral views of the chest. Large chronic left diaphragmatic hernia appears stable. Stable cardiac size and mediastinal contours. Bronchiectasis better demonstrated by CT. Chronic staple line in the right upper lobe. Stable coarse bilateral pulmonary interstitial opacity. No pneumothorax, pulmonary edema, or acute pulmonary opacity. Diffuse spinal compression fractures with at least 10 levels previously augmented. Stable visualized osseous structures. Negative visible bowel gas pattern. IMPRESSION: Chronic lung disease  with bronchiectasis. Chronic left diaphragmatic hernia. No acute cardiopulmonary abnormality identified. Electronically Signed   By: Genevie Ann M.D.   On: 07/14/2020 11:54   CT Angio Chest PE W and/or Wo Contrast  Result Date: 07/30/2020 CLINICAL DATA:  Subacute COVID pneumonia, cough EXAM: CT ANGIOGRAPHY CHEST WITH CONTRAST TECHNIQUE: Multidetector CT imaging of the chest was performed using the standard protocol during bolus administration of intravenous contrast. Multiplanar CT image reconstructions and MIPs were obtained to evaluate the vascular anatomy. CONTRAST:  78m OMNIPAQUE IOHEXOL 350 MG/ML SOLN COMPARISON:  07/14/2020 FINDINGS: Cardiovascular: There is adequate opacification of the residual pulmonary arterial tree. No intraluminal filling defect to suggest acute pulmonary embolism. The central pulmonary arteries are  enlarged in keeping with changes of pulmonary arterial hypertension, unchanged from prior examination. Mild multi-vessel coronary artery calcification. Global cardiac size within normal limits. No pericardial effusion. Moderate atherosclerotic calcification within the abdominal aorta. No aortic aneurysm. Mediastinum/Nodes: No pathologic thoracic adenopathy. The visualized thyroid is unremarkable. Small amount of debris is seen within the esophagus possibly reflecting changes of esophageal dysmotility or gastroesophageal reflux. A large hiatal hernia is seen extending into the left hemithorax with herniation of the splenic flexure of the colon and nearly the entire stomach into the left hemithorax. There is marked tortuosity of the thoracic aorta which extends into the posterior left hemithorax as result of the a large hernia. Lungs/Pleura: Status post partial right upper lobectomy and right middle lobectomy as well as left lower lobectomy. 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 There is superimposed areas of cylindrical and varicoid bronchiectasis again identified, more focal within the residual right upper lobe and basilar right lower lobe though chronic infection with atypical organisms such as MAI could result in such an appearance, an additional consideration should include the sequela of remote or recurrent infection and/or aspiration. There is these changes appears stable since prior examination. No new focal pulmonary nodules or infiltrates. No pneumothorax or pleural effusion. Upper Abdomen: Status post cholecystectomy.  No acute abnormality. Musculoskeletal: Multilevel thoracolumbar vertebroplasty has been performed of each visualized level inferior to T4. Stable remote compression fracture of T3. No acute bone abnormality. Review of the MIP images confirms the above findings. IMPRESSION: No pulmonary embolism.  Mild coronary artery calcification. Morphologic changes in keeping with pulmonary arterial hypertension. Status post partial right upper lobe, right middle lobe, and left lower lobectomy. Extensive scattered areas of cylindrical and varicoid bronchiectasis with mild tree-in-bud nodularity stable since prior examination. Differential considerations include the sequela of remote or recurrent infection, aspiration, and atypical infection as can be seen with mycobacterial or atypical fungal infection. Mosaic attenuation of the pulmonary parenchyma in keeping with air trapping secondary to small airways disease. Aortic Atherosclerosis (ICD10-I70.0). Electronically Signed   By: AFidela SalisburyMD   On: 07/30/2020 22:16   CT Angio Chest PE W and/or Wo Contrast  Result Date: 07/14/2020 CLINICAL DATA:  PE suspected, high prob EXAM: CT ANGIOGRAPHY CHEST WITH CONTRAST TECHNIQUE: Multidetector CT imaging of the chest was performed using the standard protocol during bolus administration of intravenous contrast. Multiplanar CT image reconstructions and MIPs were obtained to evaluate the vascular anatomy. CONTRAST:  741mOMNIPAQUE IOHEXOL 350 MG/ML SOLN COMPARISON:  Chest CT 04/29/2020 FINDINGS: Cardiovascular: Satisfactory opacification of the pulmonary arteries to the segmental level. No evidence of pulmonary embolism. Unchanged enlarged central pulmonary arteries compatible with pulmonary hypertension. Unchanged mild cardiomegaly. Atherosclerotic calcifications of the aorta.  Coronary artery calcifications. Mediastinum/Nodes: There is no mediastinal, hilar, or axillary lymphadenopathy. Thyroid is unremarkable. There is a large hiatal hernia containing the a hepatic flexure of the colon and the stomach, similar to prior exam. Lungs/Pleura: Diffuse bronchial wall thickening with multifocal bronchiectasis, most severe in the right upper lobe and lung bases bilaterally. Multifocal mucous plugging. Similar architectural distortion  in the of the left lower lobe. Prior resections of the lateral segment of the right upper lobe, the right middle lobe, and inferior lingula. There is small right-sided subpleural nodularity, increased from prior exam, largest measuring 2.0 x 0.6 cm, and another measuring 0.9 x 0.8 cm (series 7, images 53 and 41 respectively). Similar subpleural nodularity in the left upper lobe. Scattered lung scarring and tree-in-bud nodularity. Mosaic attenuation bilaterally, likely due to air trapping. Upper Abdomen: Unchanged left renal cyst. Prior cholecystectomy. No acute abnormality. Musculoskeletal: Unchanged multiple thoracolumbar compression deformities with vertebroplasties. Unchanged chronic T3 compression deformity. Partially visualized severe degenerative changes in the lower cervical spine. Review of the MIP images confirms the above findings. IMPRESSION: No evidence of pulmonary embolism. Unchanged enlarged pulmonary arteries suggestive of pulmonary hypertension. 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 Increased subpleural nodularity on the right, which may represent progression of disease. Chronic mosaic attenuation of the lungs likely due to air trapping. Recommend follow-up chest CT in 3 months and follow-up with pulmonology. Aortic Atherosclerosis (ICD10-I70.0). Electronically Signed   By: Maurine Simmering   On: 07/14/2020 13:33      ASSESSMENT/PLAN    Acute exacerbation of bronchiectasis  - empirically on zithromax and cefepime for possible sepsis    - agree with current antibiotics    S/p COVID19 pneumonia -mild residual on CT from this admission     Chronic AF    - continue eliquis - no hemoptysis -started amio stopped bb and dig  Appreciate cardiology - Dr Ubaldo Glassing -reduced eliquis to 2.5 due to diverticulosis on ct abd and reduction in hb -amio po 200 bid transition from  gtt  Acute blood loss anemia  FOBT   - on eliquis - 5 po bid>>2.5 bid   End of life care  - palliative consultation     Thank you for allowing me to participate in the care of this patient.   Patient/Family are satisfied with care plan and all questions have been answered.   This document was prepared using Dragon voice recognition software and may include unintentional dictation errors.     Ottie Glazier, M.D.  Division of [place]

## 2020-08-03 DIAGNOSIS — Z515 Encounter for palliative care: Secondary | ICD-10-CM | POA: Diagnosis not present

## 2020-08-03 DIAGNOSIS — Z7189 Other specified counseling: Secondary | ICD-10-CM | POA: Diagnosis not present

## 2020-08-03 DIAGNOSIS — J471 Bronchiectasis with (acute) exacerbation: Secondary | ICD-10-CM | POA: Diagnosis not present

## 2020-08-03 LAB — CBC WITH DIFFERENTIAL/PLATELET
Abs Immature Granulocytes: 0.21 10*3/uL — ABNORMAL HIGH (ref 0.00–0.07)
Basophils Absolute: 0 10*3/uL (ref 0.0–0.1)
Basophils Relative: 0 %
Eosinophils Absolute: 0 10*3/uL (ref 0.0–0.5)
Eosinophils Relative: 0 %
HCT: 26.7 % — ABNORMAL LOW (ref 36.0–46.0)
Hemoglobin: 9.2 g/dL — ABNORMAL LOW (ref 12.0–15.0)
Immature Granulocytes: 2 %
Lymphocytes Relative: 6 %
Lymphs Abs: 0.8 10*3/uL (ref 0.7–4.0)
MCH: 33.5 pg (ref 26.0–34.0)
MCHC: 34.5 g/dL (ref 30.0–36.0)
MCV: 97.1 fL (ref 80.0–100.0)
Monocytes Absolute: 0.8 10*3/uL (ref 0.1–1.0)
Monocytes Relative: 5 %
Neutro Abs: 12 10*3/uL — ABNORMAL HIGH (ref 1.7–7.7)
Neutrophils Relative %: 87 %
Platelets: 257 10*3/uL (ref 150–400)
RBC: 2.75 MIL/uL — ABNORMAL LOW (ref 3.87–5.11)
RDW: 12.9 % (ref 11.5–15.5)
WBC: 13.8 10*3/uL — ABNORMAL HIGH (ref 4.0–10.5)
nRBC: 0 % (ref 0.0–0.2)

## 2020-08-03 LAB — BASIC METABOLIC PANEL
Anion gap: 7 (ref 5–15)
BUN: 25 mg/dL — ABNORMAL HIGH (ref 8–23)
CO2: 32 mmol/L (ref 22–32)
Calcium: 8.9 mg/dL (ref 8.9–10.3)
Chloride: 91 mmol/L — ABNORMAL LOW (ref 98–111)
Creatinine, Ser: 0.55 mg/dL (ref 0.44–1.00)
GFR, Estimated: >60 mL/min (ref >60)
Glucose, Bld: 124 mg/dL — ABNORMAL HIGH (ref 70–99)
Potassium: 5 mmol/L (ref 3.5–5.1)
Sodium: 130 mmol/L — ABNORMAL LOW (ref 135–145)

## 2020-08-03 LAB — LEGIONELLA PNEUMOPHILA SEROGP 1 UR AG: L. pneumophila Serogp 1 Ur Ag: NEGATIVE

## 2020-08-03 MED ORDER — METOPROLOL TARTRATE 25 MG PO TABS
12.5000 mg | ORAL_TABLET | Freq: Two times a day (BID) | ORAL | Status: DC
  Administered 2020-08-03 – 2020-08-05 (×5): 12.5 mg via ORAL
  Filled 2020-08-03 (×5): qty 1

## 2020-08-03 MED ORDER — METHYLPREDNISOLONE SODIUM SUCC 40 MG IJ SOLR
30.0000 mg | Freq: Every day | INTRAMUSCULAR | Status: DC
  Administered 2020-08-04: 30 mg via INTRAVENOUS
  Filled 2020-08-03: qty 1

## 2020-08-03 MED ORDER — DIGOXIN 125 MCG PO TABS
0.1250 mg | ORAL_TABLET | Freq: Every day | ORAL | Status: DC
  Administered 2020-08-03 – 2020-08-07 (×5): 0.125 mg via ORAL
  Filled 2020-08-03 (×8): qty 1

## 2020-08-03 MED ORDER — LORAZEPAM 0.5 MG PO TABS
0.5000 mg | ORAL_TABLET | Freq: Four times a day (QID) | ORAL | Status: DC | PRN
  Administered 2020-08-03 – 2020-08-04 (×3): 0.5 mg via ORAL
  Filled 2020-08-03 (×3): qty 1

## 2020-08-03 MED ORDER — FUROSEMIDE 40 MG PO TABS: 40.0000 mg | ORAL_TABLET | Freq: Every day | ORAL | Status: DC

## 2020-08-03 MED ORDER — SODIUM CHLORIDE 0.9 % IV SOLN
INTRAVENOUS | Status: DC | PRN
  Administered 2020-08-03: 25 mL via INTRAVENOUS

## 2020-08-03 NOTE — Consult Note (Signed)
Consultation Note Date: 08/03/2020   Patient Name: Ana Washington  DOB: May 05, 1945  MRN: [id number]  Age / Sex: 75 y.o., female  PCP: [name], MD Referring Physician: Sidney Ace, MD  Reason for Consultation: Establishing goals of care  HPI/Patient Profile: 75 y.o. female  with past medical history of CAD, htn, GERD, bronchiectasis, chronic MAC, chronic resp failure on 2 L oxygen at home, takotsubo cardiomyopathy, a fib, anxiety, CHF, recent COVID-19 infection admitted on 07/30/2020 with persistent cough, worsening dyspnea, and weakness. Found to be in rapid a fib on admission - somewhat better controlled but occasionally becomes tachycardic and symptomatic. Patient also being treated for acute exacerbation of bronchiectasis and possible UTI. Patient with persistent anxiety. PMT consulted by Dr. Lanney Gins to discuss goals of care.   Clinical Assessment and Goals of Care: I have reviewed medical records including EPIC notes, labs and imaging, received report from RN, assessed the patient and then met with patient and her daughter Marlowe Kays  to discuss diagnosis prognosis, GOC, EOL wishes, disposition and options.  I introduced Palliative Medicine as specialized medical care for people living with serious illness. It focuses on providing relief from the symptoms and stress of a serious illness. The goal is to improve quality of life for both the patient and the family.  We discussed a brief life review of the patient. She shares with me that she lives at home with her spouse - her spouse has neuropathy and is disabled.   As far as functional and nutritional status, she tells she was doing well prior to her covid infection but since then has been incredibly weak. She feels as though she has regained some of her function since being hospitalized and working with PT. She tells me her appetite has also improved - she has problems with appetite at  home d/t chronic GI issues.    We discussed patient's current illness and what it means in the larger context of patient's on-going co-morbidities. We discuss her improvement since admission. Discussed her bodies response to ongoing treatments. Discussed continuing to work with PT/OT to increase function. Discussed her improving appetite.   I attempted to elicit values and goals of care important to the patient. The difference between aggressive medical intervention and comfort care was considered in light of the patient's goals of care. Ms Perkovich tells me she is committed to continued full scope of care - she is hopeful for continued improvement. She notes that she is motivated by her children to improve.   We review that earlier in her hospitalization she had commented that she may no longer be interested in continuing aggressive treatment and would like to involve hospice. She tells me she did make those statements but she no longer feels that way and would like to continue current measures.  She does  ask more about hospice and we discuss type of care provided by hospice. We clarify differences between palliative and hospice. She again shares she is not interested in hospice support. She is hopeful to return home with support of home health PT and OT.   Discussed with patient/family the importance of continued conversation with family and the medical providers regarding overall plan of care and treatment options, ensuring decisions are within the context of the patient's values and GOCs.    Questions and concerns were addressed. The family was encouraged to call with questions or concerns.   Of note, patient was quite anxious and tachypneic when I entered room but after  several minutes of speaking she calmed and her RR slowed. RN had also administered ativan shortly before my visit.   Primary Decision Maker PATIENT   SUMMARY OF RECOMMENDATIONS   - continue current measures/no change to  plan of care - patient clarifies that earlier in hospitalization she did state she would be interested in hospice/not returning to hospital but she no longer feels that way - patient is hopeful to return home with support of home health PT/OT, not interested in hospice at this time, could consider outpatient palliative referral - will continue goals of care conversation - plan to revisit 7/6  Code Status/Advance Care Planning: DNR     Primary Diagnoses: Present on Admission:  CAP (community acquired pneumonia)  Bronchiectasis with acute exacerbation ([place])   I have reviewed the medical record, interviewed the patient and family, and examined the patient. The following aspects are pertinent.  Past Medical History:  Diagnosis Date   Arthritis    Atrial fibrillation (HCC)    Bronchiectasis (HCC)    CAD (coronary artery disease) 07/30/2017   Dumping syndrome    Essential hypertension, malignant 10/03/2013   Family history of adverse reaction to anesthesia    sister [name]   GERD (gastroesophageal reflux disease)    Headache    MIGRAINES   Myocardial infarction ([place]) 2007   Non-STEMI   [name] (postoperative nausea and vomiting)    Psoriasis    PUD (peptic ulcer disease)    Social History   Socioeconomic History   Marital status: Married    Spouse name: Not on file   Number of children: Not on file   Years of education: Not on file   Highest education level: Not on file  Occupational History   Not on file  Tobacco Use   Smoking status: Never   Smokeless tobacco: Never  Vaping Use   Vaping Use: Never used  Substance and Sexual Activity   Alcohol use: No   Drug use: No   Sexual activity: Not on file  Other Topics Concern   Not on file  Social History Narrative   Not on file   Social Determinants of Health   Financial Resource Strain: Not on file  Food Insecurity: Not on file  Transportation Needs: Not on file  Physical Activity: Not on file  Stress: Not on file   Social Connections: Not on file   Family History  Problem Relation Age of Onset   Hypertension Mother    Hypertension Father    Scheduled Meds:  acetaminophen  650 mg Oral QPM   acetylcysteine  2 mL Nebulization BID   acidophilus  1 capsule Oral Daily   amiodarone  200 mg Oral BID   apixaban  2.5 mg Oral BID   vitamin C  1,000 mg Oral Daily   digoxin  0.125 mg Oral Daily   gabapentin  300 mg Oral TID   guaiFENesin  600 mg Oral BID   levalbuterol  0.63 mg Nebulization BID   [START ON 08/04/2020] methylPREDNISolone (SOLU-MEDROL) injection  30 mg Intravenous Daily   metoprolol tartrate  12.5 mg Oral BID   polyethylene glycol  17 g Oral Daily   senna  2 tablet Oral QHS   spironolactone  25 mg Oral Daily   Continuous Infusions:  sodium chloride 25 mL (08/03/20 1111)   ceFEPime (MAXIPIME) IV 2 g (08/03/20 1113)   PRN Meds:.sodium chloride, acetaminophen **OR** acetaminophen, levalbuterol, LORazepam, magnesium hydroxide, oxybutynin, oxyCODONE Allergies  Allergen Reactions   Codeine  Nausea And Vomiting   Sulfa Antibiotics Diarrhea   Penicillins Rash    Has patient had a PCN reaction causing immediate rash, facial/tongue/throat swelling, SOB or lightheadedness with hypotension: Unknown Has patient had a PCN reaction causing severe rash involving mucus membranes or skin necrosis: Unknown Has patient had a PCN reaction that required hospitalization: Unknown Has patient had a PCN reaction occurring within the last 10 years: No If all of the above answers are "NO", then may proceed with Cephalosporin use.    Review of Systems  Constitutional:  Positive for activity change.  Respiratory:  Positive for shortness of breath.   Neurological:  Positive for weakness.  Psychiatric/Behavioral:  The patient is nervous/anxious.    Physical Exam Constitutional:      Comments: Appears anxious with elevated RR that slowed after several minutes of conversation and benzo administration  Skin:     General: Skin is warm and dry.  Neurological:     Mental Status: She is alert and oriented to person, place, and time.    Vital Signs: BP 113/84 (BP Location: Right Arm)   Pulse 93   Temp 98.1 F (36.7 C)   Resp 16   Ht _0  (1.651 m)   Wt 50.1 kg   SpO2 98%   BMI 18.38 kg/m  Pain Scale: 0-10 POSS *See Group Information*: 1-Acceptable,Awake and alert Pain Score: 0-No pain   SpO2: SpO2: 98 % O2 Device:SpO2: 98 % O2 Flow Rate: .O2 Flow Rate (L/min): 2 L/min  IO: Intake/output summary:  Intake/Output Summary (Last 24 hours) at 08/03/2020 1304 Last data filed at 08/03/2020 1113 Gross per 24 hour  Intake 640 ml  Output 1750 ml  Net -1110 ml    LBM: Last BM Date: 08/02/20 Baseline Weight: Weight: 49.9 kg Most recent weight: Weight: 50.1 kg     Palliative Assessment/Data: PPS 50%    Time Total: 85 minutes Greater than 50%  of this time was spent counseling and coordinating care related to the above assessment and plan.  Juel Burrow, DNP, AGNP-C Palliative Medicine Team [phone] Pager: [phone]

## 2020-08-03 NOTE — Progress Notes (Signed)
PROGRESS NOTE    SAROYA RICCOBONO  MHD:[id number] DOB: 27-Jan-1946 DOA: 07/30/2020 PCP: [name], MD   Brief Narrative:   75 y.o. Caucasian female with medical history significant for coronary artery disease, essential hypertension, GERD, as well as history of bronchiectasis and chronic MAC, , chronic respiratory failure (on 2lpm via [place] at home), Takotsubo cardiomyopathy ([facility] at [facility]), gastroesophageal reflux disease, hypertension, paroxysmal atrial fibrillation (on Eliquis), generalized anxiety disorder, diastolic congestive heart failure (Echo 03/2020 EF 50-55% with G1DD), with recent COVID 19 infection 16 days ago, who presented to the emergency room with acute onset of persistent cough since then with expectoration of yellowish green sputum and worsening dyspnea today as well as wheezing.  She has been feeling "very very weak".  She was admitted here recently for exacerbation of her bronchiectasis from 6/14-6/20.  She was discharged home on p.o. Levaquin and prednisone.  She finished a course of IV remdesivir when she was here  The patient is well-known to both Dr. Ubaldo Glassing and Dr. Lanney Gins.  I appreciate consultations from both of them.  Clinical presentation is likely mixed combination of exacerbation of bronchiectasis, partial contribution from suspected urinary tract infection and rapid atrial fibrillation.  Patient was in rapid atrial fibrillation on admission.  Initially controlled however occasionally becomes tachycardic and acutely symptomatic.  Responded to IV Cardizem however rate became uncontrolled shortly thereafter.  After pulmonary evaluation was started on amiodarone infusion.  Patient appears to be responding to initial therapy.  Heart rate improved on amiodarone infusion.  Patient was endorsing abdominal pain.  CT abdomen pelvis did not reveal any abnormalities.  Unclear driving force for abdominal pain.  Patient reports a bandlike sensation around the upper abdomen.  7/3:  Appears to be responding to therapy.  Greatly appreciate pulmonary and cardiac recommendations.  Abdominal pain improved.  No bowel movement reported yet.  All cultures no growth to date.  Reports better sleep after discontinuation of trazodone.  7/4: Poor sleep.  FOBT negative.  Afib remains difficult to control   Assessment & Plan:   Active Problems:   CAP (community acquired pneumonia)   Bronchiectasis with acute exacerbation ([place])  Acute exacerbation of bronchiectasis Possible urinary tract infection Sepsis secondary to above Patient has advanced lung disease with frequent readmissions Most recently 2 weeks ago Patient is well-known to pulmonary consultant, recommendations appreciated Plan: Continue IV cefepime Follow blood cultures, no growth to date As needed mucolytic's Continue Xopenex every 4 hours as needed IV steroids per pulmonary recommendations, currently on 74m IV solumedrol daily Appreciate pulmonary recommendations  Atrial fibrillation with rapid ventricular response Likely triggered by underlying infection and lung disease Home regimen includes digoxin and Toprol-XL Cardizem CD was discontinued on previous admission Patient known to Dr. FUbaldo GlassingInitially responded to Cardizem bolus however became rapid again Started amiodarone gtt. on 7/1, digoxin and metoprolol held Transition to p.o. amiodarone 7/2 Occasional elevation heart rate - Patient with high anxiety.  Suspect this is worsening her afib Plan: Increase ativan to 0.549mq6h prn Continue p.o. amiodarone 200 twice daily As needed Cardizem pushes for sustained tachycardia Eliquis for VTE prophylaxis  Essential hypertension Cozaar Toprol  GERD PPI  Anxiety Ativan increased to 0.10m60m6h PRN  Functional decline Frequent readmissions The patient had expressed an interest in engaging hospice services due to decline of function and frequent readmissions.  She would like to avoid being hospitalized in  the future.  I agree with her assessment.  We have engaged consultation from both hospice liaison  as well as palliative care services. Plan: At time of this note patient would like to continue pushing forward with aggressive treatment  DVT prophylaxis: Eliquis Code Status: DNR Family Communication: None today.  Offered to call but patient declined Disposition Plan: Status is: Inpatient  Remains inpatient appropriate because:Inpatient level of care appropriate due to severity of illness  Dispo: The patient is from: Home              Anticipated d/c is to:  TBD              Patient currently is not medically stable to d/c.   Difficult to place patient No  Complex presentation.  Acute flare of bronchiectasis with suspected pneumonia.  Associated with sepsis and rapid atrial fibrillation.  Disposition plan unclear at this time.     Level of care: Progressive Cardiac  Consultants:  Cardiology, [facility] Pulmonology  Procedures: None  Antimicrobials:  Cefepime   Subjective: Patient seen and examined.  Resting comfortably in bed.  Endorses poor sleep and palpitations  Objective: Vitals:   08/02/20 1955 08/02/20 2339 08/03/20 0415 08/03/20 0719  BP: (!) 135/94 (!) 126/96 124/87 127/80  Pulse: (!) 111 71 (!) 111 91  Resp: 19 (!) 21 16   Temp: 98.3 F (36.8 C) 98.2 F (36.8 C) 97.8 F (36.6 C) 98 F (36.7 C)  TempSrc: Oral Oral Oral   SpO2: 98% 100%  99%  Weight:      Height:        Intake/Output Summary (Last 24 hours) at 08/03/2020 0821 Last data filed at 08/03/2020 0537 Gross per 24 hour  Intake 400 ml  Output 1300 ml  Net -900 ml   Filed Weights   07/30/20 2003 07/31/20 2200  Weight: 49.9 kg 50.1 kg    Examination:  General exam: No apparent distress.  Appears anxious Respiratory system: Bibasilar crackles.  Normal WOB, 2L Cardiovascular system: Regular rate, irregular rhythm, no murmurs Gastrointestinal system: Abdomen is nondistended, soft and  nontender. No organomegaly or masses felt. Normal bowel sounds heard. Central nervous system: Alert and oriented. No focal neurological deficits. Extremities: Symmetric 5 x 5 power. Skin: Pale.  No obvious rashes or lesions Psychiatry: Judgement and insight appear normal. Mood & affect appropriate.     Data Reviewed: I have personally reviewed following labs and imaging studies  CBC: Recent Labs  Lab 07/30/20 2035 07/31/20 0436 08/01/20 0521 08/02/20 0644 08/03/20 0607  WBC 17.6* 11.8* 12.8* 10.3 13.8*  NEUTROABS 14.2*  --  11.4* 9.1* 12.0*  HGB 11.3* 9.9* 10.0* 9.6* 9.2*  HCT 31.9* 29.1* 27.6* 27.1* 26.7*  MCV 96.1 99.0 92.9 95.8 97.1  PLT 309 233 276 252 161   Basic Metabolic Panel: Recent Labs  Lab 07/30/20 2035 07/31/20 0436 08/01/20 0521 08/02/20 0644 08/03/20 0607  NA 126* 131* 131* 130* 130*  K 3.8 3.5 4.0 5.0 5.0  CL 84* 93* 91* 91* 91*  CO2 34* 32 32 33* 32  GLUCOSE 119* 107* 149* 131* 124*  BUN 6* 6* 9 12 25*  CREATININE 0.42* 0.32* 0.42* 0.47 0.55  CALCIUM 8.9 8.1* 8.7* 8.8* 8.9   GFR: Estimated Creatinine Clearance: 48.1 mL/min (by C-G formula based on SCr of 0.55 mg/dL). Liver Function Tests: Recent Labs  Lab 07/30/20 2035  AST 26  ALT 17  ALKPHOS 43  BILITOT 0.7  PROT 6.0*  ALBUMIN 3.2*   No results for input(s): LIPASE, AMYLASE in the last 168 hours. No results for input(s): AMMONIA  in the last 168 hours. Coagulation Profile: Recent Labs  Lab 07/30/20 2035  INR 1.4*   Cardiac Enzymes: No results for input(s): CKTOTAL, CKMB, CKMBINDEX, TROPONINI in the last 168 hours. BNP (last 3 results) No results for input(s): PROBNP in the last 8760 hours. HbA1C: No results for input(s): HGBA1C in the last 72 hours. CBG: No results for input(s): GLUCAP in the last 168 hours. Lipid Profile: No results for input(s): CHOL, HDL, LDLCALC, TRIG, CHOLHDL, LDLDIRECT in the last 72 hours. Thyroid Function Tests: No results for input(s): TSH, T4TOTAL,  FREET4, T3FREE, THYROIDAB in the last 72 hours. Anemia Panel: No results for input(s): VITAMINB12, FOLATE, FERRITIN, TIBC, IRON, RETICCTPCT in the last 72 hours. Sepsis Labs: Recent Labs  Lab 07/30/20 2035 07/31/20 1051 08/01/20 0521 08/02/20 0644  PROCALCITON  --  <0.10 <0.10 <0.10  LATICACIDVEN 1.0  --   --   --     Recent Results (from the past 240 hour(s))  Blood culture (single)     Status: None (Preliminary result)   Collection Time: 07/30/20  8:36 PM   Specimen: BLOOD  Result Value Ref Range Status   Specimen Description BLOOD BLOOD RIGHT FOREARM  Final   Special Requests   Final    BOTTLES DRAWN AEROBIC AND ANAEROBIC Blood Culture adequate volume   Culture   Final    NO GROWTH 4 DAYS Performed at [facility], [address], [place], [place] [address]    Report Status PENDING  Incomplete  Culture, blood (single)     Status: None (Preliminary result)   Collection Time: 07/30/20  9:31 PM   Specimen: BLOOD  Result Value Ref Range Status   Specimen Description BLOOD BLOOD LEFT FOREARM  Final   Special Requests   Final    BOTTLES DRAWN AEROBIC AND ANAEROBIC Blood Culture adequate volume   Culture   Final    NO GROWTH 4 DAYS Performed at [facility], [address], [place], [place] [address]    Report Status PENDING  Incomplete  CULTURE, BLOOD (ROUTINE X 2) w Reflex to ID Panel     Status: None (Preliminary result)   Collection Time: 07/31/20  9:42 AM   Specimen: BLOOD  Result Value Ref Range Status   Specimen Description BLOOD  LEFT AC  Final   Special Requests   Final    BOTTLES DRAWN AEROBIC AND ANAEROBIC Blood Culture results may not be optimal due to an excessive volume of blood received in culture bottles   Culture   Final    NO GROWTH 3 DAYS Performed at [facility], [address] [place], [place] [address]    Report Status PENDING  Incomplete  CULTURE, BLOOD (ROUTINE X 2) w Reflex to ID Panel     Status: None  (Preliminary result)   Collection Time: 07/31/20 10:27 AM   Specimen: BLOOD  Result Value Ref Range Status   Specimen Description BLOOD L FOREARM  Final   Special Requests   Final    BOTTLES DRAWN AEROBIC AND ANAEROBIC Blood Culture results may not be optimal due to an excessive volume of blood received in culture bottles   Culture   Final    NO GROWTH 3 DAYS Performed at [facility], [address], [place], [place] [address]    Report Status PENDING  Incomplete         Radiology Studies: DG Chest Port 1 View  Result Date: 08/02/2020 CLINICAL DATA:  Chronic hypoxemia. EXAM: PORTABLE  CHEST 1 VIEW COMPARISON:  07/30/2020 FINDINGS: Stable cardiomediastinal contours. Postoperative changes with suture chains noted in the perihilar right lung and right upper lobe as well as the left midlung and perihilar left mid lung. Diffuse chronic interstitial advanced chronic interstitial changes are identified throughout both lungs reflecting underlying postinflammatory changes including bronchiectasis, interstitial scarring and reticulation. Small right pleural effusion unchanged. Large hiatal hernia is identified which overlies the left heart and left lower lung. Unchanged. IMPRESSION: 1. Severe chronic interstitial lung disease appears unchanged from previous exam. 2. Stable appearance of large hiatal hernia. Electronically Signed   By: Kerby Moors M.D.   On: 08/02/2020 12:31        Scheduled Meds:  acetaminophen  650 mg Oral QPM   acetylcysteine  2 mL Nebulization BID   acidophilus  1 capsule Oral Daily   amiodarone  200 mg Oral BID   apixaban  2.5 mg Oral BID   vitamin C  1,000 mg Oral Daily   furosemide  40 mg Intravenous Daily   gabapentin  300 mg Oral TID   guaiFENesin  600 mg Oral BID   levalbuterol  0.63 mg Nebulization BID   methylPREDNISolone (SOLU-MEDROL) injection  40 mg Intravenous Daily   polyethylene glycol  17 g Oral Daily   potassium chloride  10 mEq Oral Daily    senna  2 tablet Oral QHS   spironolactone  25 mg Oral Daily   Continuous Infusions:  ceFEPime (MAXIPIME) IV 2 g (08/02/20 2121)     LOS: 4 days    Time spent: 25 minutes    Sidney Ace, MD [facility] Pager [phone]  If 7PM-7AM, please contact night-coverage 08/03/2020, 8:21 AM

## 2020-08-03 NOTE — Progress Notes (Signed)
Patient Name: Ana Washington Date of Encounter: 08/03/2020  Hospital Problem List     Active Problems:   CAP (community acquired pneumonia)   Bronchiectasis with acute exacerbation [facility])    Patient Profile     75 y.o. female with history of Takotsubo syndrome with normal coronaries and ejection fraction of 45%, history of paroxysmal atrial fibrillation, hypertension who presented to emergency room with shortness of breath and was noted to have diffuse rectal nodular opacities throughout the lungs consistent with atypical mycobacterial infection.  Chest CT revealed, atypical infection consistent as above with mycobacterial or atypical fungal infection as well as aspiration.  Pulmonary embolism.  EKG on admission revealed atrial fibrillation.  Patient states she was taken off of Cardizem at a previous hospitalization.   Subjective   Doing well this morning although still somewhat anxious.  Appears at her baseline mentally and physically.  Inpatient Medications     acetaminophen  650 mg Oral QPM   acetylcysteine  2 mL Nebulization BID   acidophilus  1 capsule Oral Daily   amiodarone  200 mg Oral BID   apixaban  2.5 mg Oral BID   vitamin C  1,000 mg Oral Daily   furosemide  40 mg Intravenous Daily   gabapentin  300 mg Oral TID   guaiFENesin  600 mg Oral BID   levalbuterol  0.63 mg Nebulization BID   methylPREDNISolone (SOLU-MEDROL) injection  40 mg Intravenous Daily   polyethylene glycol  17 g Oral Daily   potassium chloride  10 mEq Oral Daily   senna  2 tablet Oral QHS   spironolactone  25 mg Oral Daily    Vital Signs    Vitals:   08/02/20 1955 08/02/20 2339 08/03/20 0415 08/03/20 0719  BP: (!) 135/94 (!) 126/96 124/87 127/80  Pulse: (!) 111 71 (!) 111 91  Resp: 19 (!) 21 16   Temp: 98.3 F (36.8 C) 98.2 F (36.8 C) 97.8 F (36.6 C) 98 F (36.7 C)  TempSrc: Oral Oral Oral   SpO2: 98% 100%  99%  Weight:      Height:        Intake/Output Summary (Last 24 hours) at  08/03/2020 0953 Last data filed at 08/03/2020 0537 Gross per 24 hour  Intake 400 ml  Output 1300 ml  Net -900 ml   Filed Weights   07/30/20 2003 07/31/20 2200  Weight: 49.9 kg 50.1 kg    Physical Exam    GEN: Well nourished, well developed, in no acute distress.  HEENT: normal.  Neck: Supple, no JVD, carotid bruits, or masses. Cardiac: irr, irr Respiratory:  Respirations regular and unlabored, clear to auscultation bilaterally. GI: Soft, nontender, nondistended, BS + x 4. MS: no deformity or atrophy. Skin: warm and dry, no rash. Neuro:  Strength and sensation are intact. Psych: Normal affect.  Labs    CBC Recent Labs    08/02/20 0644 08/03/20 0607  WBC 10.3 13.8*  NEUTROABS 9.1* 12.0*  HGB 9.6* 9.2*  HCT 27.1* 26.7*  MCV 95.8 97.1  PLT 252 [id number]   Basic Metabolic Panel Recent Labs    08/02/20 0644 08/03/20 0607  NA 130* 130*  K 5.0 5.0  CL 91* 91*  CO2 33* 32  GLUCOSE 131* 124*  BUN 12 25*  CREATININE 0.47 0.55  CALCIUM 8.8* 8.9   Liver Function Tests No results for input(s): AST, ALT, ALKPHOS, BILITOT, PROT, ALBUMIN in the last 72 hours. No results for input(s): LIPASE, AMYLASE in  the last 72 hours. Cardiac Enzymes No results for input(s): CKTOTAL, CKMB, CKMBINDEX, TROPONINI in the last 72 hours. BNP No results for input(s): BNP in the last 72 hours. D-Dimer No results for input(s): DDIMER in the last 72 hours. Hemoglobin A1C No results for input(s): HGBA1C in the last 72 hours. Fasting Lipid Panel No results for input(s): CHOL, HDL, LDLCALC, TRIG, CHOLHDL, LDLDIRECT in the last 72 hours. Thyroid Function Tests No results for input(s): TSH, T4TOTAL, T3FREE, THYROIDAB in the last 72 hours.  Invalid input(s): FREET3  Telemetry    afib  ECG    afib  Radiology    CT ABDOMEN PELVIS WO CONTRAST  Result Date: 07/31/2020 CLINICAL DATA:  Lower abdominal pain.  Tachycardia and tachypnea. EXAM: CT ABDOMEN AND PELVIS WITHOUT CONTRAST TECHNIQUE:  Multidetector CT imaging of the abdomen and pelvis was performed following the standard protocol without IV contrast. COMPARISON:  12/25/2018 and prior CT a chest 07/30/2020 FINDINGS: Lower chest: Stable large hiatal hernia containing most of the stomach. Extensive cylindrical bronchiectasis, airway thickening, and some airway plugging in the lung bases as shown on the exam from 07/30/2020. A small right pleural effusion is mildly increased from 07/30/2020. Mild cardiomegaly noted along with coronary artery and descending thoracic aortic atherosclerotic calcification. High-density material along the left lung base pleural margin, stable. Hepatobiliary: Cholecystectomy. Prominent common bile duct at 1.3 cm in diameter on image 33 series 5, previously the same on 12/25/2018, this biliary dilatation may represent a physiologic response to cholecystectomy. Pancreas: Unremarkable Spleen: Old granulomatous disease. Adrenals/Urinary Tract: Accentuated density in both renal collecting systems probably left over from recent contrast injection. Stable left mid kidney exophytic lesion compatible with cyst. Contrast medium in the urinary bladder. Adrenal glands unremarkable. Stomach/Bowel: Large hiatal hernia containing the majority of the stomach as well as part of the transverse colon and splenic flexure. No findings of strangulation or obstruction, this herniation is chronic. Sigmoid colon diverticulosis without findings of active diverticulitis. Vascular/Lymphatic: Aortoiliac atherosclerotic vascular disease. Reproductive: Small calcified posterior uterine body fibroid. Other: No supplemental non-categorized findings. Musculoskeletal: Bony demineralization. Deformities from old bilateral pelvic fractures, stable from 12/25/2018. Right hip ORIF. Compression fractures at all lumbar levels and all visualized lower thoracic levels, with vertebral augmentations at all visible lower thoracic levels and at L1, L2, and L3. The  lumbar compression fractures at L4 and L5 appear similar to the 12/25/2018 exam. IMPRESSION: 1. A specific cause for patient's lower abdominal pain is not identified. 2. Small right pleural effusion, but increased in size compared to 07/30/2020. 3. Other imaging findings of potential clinical significance: Large hiatal hernia containing most of the stomach as well as part of the transverse colon. Prominent cylindrical bronchiectasis in both lower lobes with scattered airway plugging. Mild cardiomegaly. Aortic Atherosclerosis (ICD10-I70.0). Coronary atherosclerosis. Stable chronic extrahepatic biliary dilatation, possibly a physiologic response to cholecystectomy. Sigmoid diverticulosis. Small uterine fibroid. Old pelvic fractures. Chronic compression fractures at all visualized thoracic and lumbar levels with vertebral augmentations at all visualized thoracolumbar levels except for L4 and L5. Electronically Signed   By: Van Clines M.D.   On: 07/31/2020 20:56   DG Chest 1 View  Result Date: 07/30/2020 CLINICAL DATA:  COVID diagnosis 5 weeks prior, known metastatic disease, increasing shortness of breath with productive cough EXAM: CHEST  1 VIEW COMPARISON:  Radiograph 07/14/2020, CT 07/14/2020 FINDINGS: Large left diaphragmatic eventration. Some adjacent passive atelectatic changes. Diffuse reticulonodular opacities present throughout both lungs. Postsurgical changes in the left hilum and right upper lung  are stable from priors and likely reflecting multiple resections. No pneumothorax. No visible layering effusion. Stable cardiomediastinal contours with a calcified aorta. Multilevel vertebroplasty changes are seen. No acute osseous abnormality or suspicious osseous lesion. The osseous structures appear diffusely demineralized which may limit detection of small or nondisplaced fractures. Degenerative changes in the shoulders, right greater than left with high-riding humeral head suggesting underlying  rotator cuff insufficiency. Telemetry leads overlie the chest. IMPRESSION: Diffuse reticulonodular opacities throughout the lungs, can be seen in the setting as atypical mycobacterial infection as suggested previously versus additional acute superimposed infection or inflammation. Large left diaphragmatic eventration. Chronic scarring and architectural distortion with areas of postsurgical changes in the left hilum and right upper lung. Aortic Atherosclerosis (ICD10-I70.0). Multilevel vertebroplasty. Electronically Signed   By: Lovena Le M.D.   On: 07/30/2020 20:34   DG Chest 2 View  Result Date: 07/14/2020 CLINICAL DATA:  75 year old female with shortness of breath and cough. EXAM: CHEST - 2 VIEW COMPARISON:  Chest CT 04/29/2020 and earlier. FINDINGS: Seated AP and lateral views of the chest. Large chronic left diaphragmatic hernia appears stable. Stable cardiac size and mediastinal contours. Bronchiectasis better demonstrated by CT. Chronic staple line in the right upper lobe. Stable coarse bilateral pulmonary interstitial opacity. No pneumothorax, pulmonary edema, or acute pulmonary opacity. Diffuse spinal compression fractures with at least 10 levels previously augmented. Stable visualized osseous structures. Negative visible bowel gas pattern. IMPRESSION: Chronic lung disease with bronchiectasis. Chronic left diaphragmatic hernia. No acute cardiopulmonary abnormality identified. Electronically Signed   By: Genevie Ann M.D.   On: 07/14/2020 11:54   CT Angio Chest PE W and/or Wo Contrast  Result Date: 07/30/2020 CLINICAL DATA:  Subacute COVID pneumonia, cough EXAM: CT ANGIOGRAPHY CHEST WITH CONTRAST TECHNIQUE: Multidetector CT imaging of the chest was performed using the standard protocol during bolus administration of intravenous contrast. Multiplanar CT image reconstructions and MIPs were obtained to evaluate the vascular anatomy. CONTRAST:  40m OMNIPAQUE IOHEXOL 350 MG/ML SOLN COMPARISON:  07/14/2020  FINDINGS: Cardiovascular: There is adequate opacification of the residual pulmonary arterial tree. No intraluminal filling defect to suggest acute pulmonary embolism. The central pulmonary arteries are enlarged in keeping with changes of pulmonary arterial hypertension, unchanged from prior examination. Mild multi-vessel coronary artery calcification. Global cardiac size within normal limits. No pericardial effusion. Moderate atherosclerotic calcification within the abdominal aorta. No aortic aneurysm. Mediastinum/Nodes: No pathologic thoracic adenopathy. The visualized thyroid is unremarkable. Small amount of debris is seen within the esophagus possibly reflecting changes of esophageal dysmotility or gastroesophageal reflux. A large hiatal hernia is seen extending into the left hemithorax with herniation of the splenic flexure of the colon and nearly the entire stomach into the left hemithorax. There is marked tortuosity of the thoracic aorta which extends into the posterior left hemithorax as result of the a large hernia. Lungs/Pleura: Status post partial right upper lobectomy and right middle lobectomy as well as left lower lobectomy. 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 There is superimposed areas of cylindrical and varicoid bronchiectasis again identified, more focal within the residual right upper lobe and basilar right lower lobe though chronic infection with atypical organisms such as MAI could result in such an appearance, an additional consideration should include the sequela of remote or recurrent infection and/or aspiration. There is these changes appears stable since prior examination. No new focal pulmonary nodules or infiltrates. No pneumothorax or pleural effusion. Upper Abdomen: Status  post cholecystectomy.  No acute abnormality. Musculoskeletal: Multilevel thoracolumbar vertebroplasty has been performed  of each visualized level inferior to T4. Stable remote compression fracture of T3. No acute bone abnormality. Review of the MIP images confirms the above findings. IMPRESSION: No pulmonary embolism. Mild coronary artery calcification. Morphologic changes in keeping with pulmonary arterial hypertension. Status post partial right upper lobe, right middle lobe, and left lower lobectomy. Extensive scattered areas of cylindrical and varicoid bronchiectasis with mild tree-in-bud nodularity stable since prior examination. Differential considerations include the sequela of remote or recurrent infection, aspiration, and atypical infection as can be seen with mycobacterial or atypical fungal infection. Mosaic attenuation of the pulmonary parenchyma in keeping with air trapping secondary to small airways disease. Aortic Atherosclerosis (ICD10-I70.0). Electronically Signed   By: Fidela Salisbury MD   On: 07/30/2020 22:16   CT Angio Chest PE W and/or Wo Contrast  Result Date: 07/14/2020 CLINICAL DATA:  PE suspected, high prob EXAM: CT ANGIOGRAPHY CHEST WITH CONTRAST TECHNIQUE: Multidetector CT imaging of the chest was performed using the standard protocol during bolus administration of intravenous contrast. Multiplanar CT image reconstructions and MIPs were obtained to evaluate the vascular anatomy. CONTRAST:  9m OMNIPAQUE IOHEXOL 350 MG/ML SOLN COMPARISON:  Chest CT 04/29/2020 FINDINGS: Cardiovascular: Satisfactory opacification of the pulmonary arteries to the segmental level. No evidence of pulmonary embolism. Unchanged enlarged central pulmonary arteries compatible with pulmonary hypertension. Unchanged mild cardiomegaly. Atherosclerotic calcifications of the aorta. Coronary artery calcifications. Mediastinum/Nodes: There is no mediastinal, hilar, or axillary lymphadenopathy. Thyroid is unremarkable. There is a large hiatal hernia containing the a hepatic flexure of the colon and the stomach, similar to prior exam.  Lungs/Pleura: Diffuse bronchial wall thickening with multifocal bronchiectasis, most severe in the right upper lobe and lung bases bilaterally. Multifocal mucous plugging. Similar architectural distortion in the of the left lower lobe. Prior resections of the lateral segment of the right upper lobe, the right middle lobe, and inferior lingula. There is small right-sided subpleural nodularity, increased from prior exam, largest measuring 2.0 x 0.6 cm, and another measuring 0.9 x 0.8 cm (series 7, images 53 and 41 respectively). Similar subpleural nodularity in the left upper lobe. Scattered lung scarring and tree-in-bud nodularity. Mosaic attenuation bilaterally, likely due to air trapping. Upper Abdomen: Unchanged left renal cyst. Prior cholecystectomy. No acute abnormality. Musculoskeletal: Unchanged multiple thoracolumbar compression deformities with vertebroplasties. Unchanged chronic T3 compression deformity. Partially visualized severe degenerative changes in the lower cervical spine. Review of the MIP images confirms the above findings. IMPRESSION: No evidence of pulmonary embolism. Unchanged enlarged pulmonary arteries suggestive of pulmonary hypertension. 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 Increased subpleural nodularity on the right, which may represent progression of disease. Chronic mosaic attenuation of the lungs likely due to air trapping. Recommend follow-up chest CT in 3 months and follow-up with pulmonology. Aortic Atherosclerosis (ICD10-I70.0). Electronically Signed   By: JMaurine Simmering  On: 07/14/2020 13:33   DG Chest Port 1 View  Result Date: 08/02/2020 CLINICAL DATA:  Chronic hypoxemia. EXAM: PORTABLE CHEST 1 VIEW COMPARISON:  07/30/2020 FINDINGS: Stable cardiomediastinal contours. Postoperative changes with suture chains noted in the perihilar right lung and right  upper lobe as well as the left midlung and perihilar left mid lung. Diffuse chronic interstitial advanced chronic interstitial changes are identified throughout both lungs reflecting underlying postinflammatory changes including bronchiectasis, interstitial scarring and reticulation. Small right pleural effusion unchanged. Large hiatal hernia is identified which overlies  the left heart and left lower lung. Unchanged. IMPRESSION: 1. Severe chronic interstitial lung disease appears unchanged from previous exam. 2. Stable appearance of large hiatal hernia. Electronically Signed   By: Kerby Moors M.D.   On: 08/02/2020 12:31    Assessment & Plan     75 year old female with history of Takotsubo syndrome with normal coronary arteries apical hypokinesis, history of paroxysmal atrial fibrillation who presented to emergency room with shortness of breath.  Was noted to have probable atypical pneumonia.  Noted to be in atrial fibrillation with variable but initially rapid ventricular response.  Received Cardizem bolus of 10 mg.  Rate is controlled at present.  Currently on metoprolol succinate at 25 daily, digoxin 125 mcg, and Eliquis at 5 mg twice daily which she is on as an outpatient.  1.  Atrial fibrillation-rate is better controlled now that she is on amiodarone.  Agree with converting to 200 mg twice daily p.o.  If rate remains somewhat elevated may transiently increase to 400 twice daily during hospitalization and follow.  Continue with apixaban  2.  Bronchiectasis-appreciate pulmonary input.  We will continue with their recommendations.  Appears somewhat better this morning.  3. Sob-will change iv lasix to po and follow.     Signed, Javier Docker Rosalie Buenaventura MD 08/03/2020, 9:53 AM  Pager: [phone]

## 2020-08-04 DIAGNOSIS — J471 Bronchiectasis with (acute) exacerbation: Secondary | ICD-10-CM | POA: Diagnosis not present

## 2020-08-04 LAB — BASIC METABOLIC PANEL
Anion gap: 9 (ref 5–15)
BUN: 26 mg/dL — ABNORMAL HIGH (ref 8–23)
CO2: 32 mmol/L (ref 22–32)
Calcium: 8.8 mg/dL — ABNORMAL LOW (ref 8.9–10.3)
Chloride: 85 mmol/L — ABNORMAL LOW (ref 98–111)
Creatinine, Ser: 0.59 mg/dL (ref 0.44–1.00)
GFR, Estimated: >60 mL/min (ref >60)
Glucose, Bld: 84 mg/dL (ref 70–99)
Potassium: 5.4 mmol/L — ABNORMAL HIGH (ref 3.5–5.1)
Sodium: 126 mmol/L — ABNORMAL LOW (ref 135–145)

## 2020-08-04 LAB — CULTURE, BLOOD (SINGLE)
Culture: NO GROWTH
Culture: NO GROWTH
Special Requests: ADEQUATE
Special Requests: ADEQUATE

## 2020-08-04 LAB — CBC WITH DIFFERENTIAL/PLATELET
Abs Immature Granulocytes: 0.25 10*3/uL — ABNORMAL HIGH (ref 0.00–0.07)
Basophils Absolute: 0 10*3/uL (ref 0.0–0.1)
Basophils Relative: 0 %
Eosinophils Absolute: 0 10*3/uL (ref 0.0–0.5)
Eosinophils Relative: 0 %
HCT: 29.7 % — ABNORMAL LOW (ref 36.0–46.0)
Hemoglobin: 10.3 g/dL — ABNORMAL LOW (ref 12.0–15.0)
Immature Granulocytes: 2 %
Lymphocytes Relative: 8 %
Lymphs Abs: 1.4 10*3/uL (ref 0.7–4.0)
MCH: 33.7 pg (ref 26.0–34.0)
MCHC: 34.7 g/dL (ref 30.0–36.0)
MCV: 97.1 fL (ref 80.0–100.0)
Monocytes Absolute: 0.9 10*3/uL (ref 0.1–1.0)
Monocytes Relative: 5 %
Neutro Abs: 14.6 10*3/uL — ABNORMAL HIGH (ref 1.7–7.7)
Neutrophils Relative %: 85 %
Platelets: 187 10*3/uL (ref 150–400)
RBC: 3.06 MIL/uL — ABNORMAL LOW (ref 3.87–5.11)
RDW: 12.6 % (ref 11.5–15.5)
WBC: 17.2 10*3/uL — ABNORMAL HIGH (ref 4.0–10.5)
nRBC: 0 % (ref 0.0–0.2)

## 2020-08-04 MED ORDER — LORAZEPAM 0.5 MG PO TABS
0.5000 mg | ORAL_TABLET | Freq: Every day | ORAL | Status: DC
  Administered 2020-08-05 – 2020-08-06 (×2): 0.5 mg via ORAL
  Filled 2020-08-04 (×2): qty 1

## 2020-08-04 MED ORDER — FUROSEMIDE 10 MG/ML IJ SOLN
40.0000 mg | Freq: Once | INTRAMUSCULAR | Status: AC
  Administered 2020-08-04: 40 mg via INTRAVENOUS
  Filled 2020-08-04: qty 4

## 2020-08-04 MED ORDER — SODIUM ZIRCONIUM CYCLOSILICATE 10 G PO PACK
10.0000 g | PACK | Freq: Once | ORAL | Status: AC
  Administered 2020-08-04: 10 g via ORAL
  Filled 2020-08-04: qty 1

## 2020-08-04 MED ORDER — ALPRAZOLAM 0.25 MG PO TABS
0.2500 mg | ORAL_TABLET | Freq: Three times a day (TID) | ORAL | Status: DC | PRN
  Administered 2020-08-04 (×2): 0.25 mg via ORAL
  Filled 2020-08-04 (×2): qty 1

## 2020-08-04 MED ORDER — PREDNISONE 50 MG PO TABS
50.0000 mg | ORAL_TABLET | Freq: Every day | ORAL | Status: DC
  Administered 2020-08-05: 50 mg via ORAL
  Filled 2020-08-04: qty 1

## 2020-08-04 NOTE — Progress Notes (Signed)
PROGRESS NOTE    Ana Washington  FIE:[id number] DOB: [birth year] DOA: 07/30/2020 PCP: [name], MD   Brief Narrative:   75 y.o. Caucasian female with medical history significant for coronary artery disease, essential hypertension, GERD, as well as history of bronchiectasis and chronic MAC, , chronic respiratory failure (on 2lpm via [place] at home), Takotsubo cardiomyopathy (2015 at [facility]), gastroesophageal reflux disease, hypertension, paroxysmal atrial fibrillation (on Eliquis), generalized anxiety disorder, diastolic congestive heart failure (Echo 03/2020 EF 50-55% with G1DD), with recent COVID 19 infection 16 days ago, who presented to the emergency room with acute onset of persistent cough since then with expectoration of yellowish green sputum and worsening dyspnea today as well as wheezing.  She has been feeling "very very weak".  She was admitted here recently for exacerbation of her bronchiectasis from 6/14-6/20.  She was discharged home on p.o. Levaquin and prednisone.  She finished a course of IV remdesivir when she was here  The patient is well-known to both Dr. Ubaldo Glassing and Dr. Lanney Gins.  I appreciate consultations from both of them.  Clinical presentation is likely mixed combination of exacerbation of bronchiectasis, partial contribution from suspected urinary tract infection and rapid atrial fibrillation.  Patient was in rapid atrial fibrillation on admission.  Initially controlled however occasionally becomes tachycardic and acutely symptomatic.  Responded to IV Cardizem however rate became uncontrolled shortly thereafter.  After pulmonary evaluation was started on amiodarone infusion.  Patient appears to be responding to initial therapy.  Heart rate improved on amiodarone infusion.  Patient was endorsing abdominal pain.  CT abdomen pelvis did not reveal any abnormalities.  Unclear driving force for abdominal pain.  Patient reports a bandlike sensation around the upper abdomen.  7/3:  Appears to be responding to therapy.  Greatly appreciate pulmonary and cardiac recommendations.  Abdominal pain improved.  No bowel movement reported yet.  All cultures no growth to date.  Reports better sleep after discontinuation of trazodone.  7/4: Poor sleep.  FOBT negative.  Afib remains difficult to control 7/5: Symptomatically improving.  Reports that daytime Ativan makes her too lethargic.  Seen by pulmonary consultant in follow-up.  Patient is symptomatically improving however has had similar improvements on previous hospitalizations and subsequent deterioration once independent at home.   Assessment & Plan:   Active Problems:   CAP (community acquired pneumonia)   Bronchiectasis with acute exacerbation ([place])  Acute exacerbation of bronchiectasis Possible urinary tract infection Sepsis secondary to above Patient has advanced lung disease with frequent readmissions Most recently 2 weeks ago Patient is well-known to pulmonary consultant, recommendations appreciated Plan: Continue IV cefepime Follow blood cultures, no growth to date As needed mucolytic's Continue Xopenex every 4 hours as needed IV steroids per pulmonary recommendations, currently on 62m IV solumedrol daily Appreciate pulmonary recommendations  Atrial fibrillation with rapid ventricular response Likely triggered by underlying infection and lung disease Home regimen includes digoxin and Toprol-XL Cardizem CD was discontinued on previous admission Patient known to Dr. FUbaldo GlassingInitially responded to Cardizem bolus however became rapid again Started amiodarone gtt. on 7/1, digoxin and metoprolol held Transition to p.o. amiodarone 7/2 Occasional elevation heart rate - Patient with high anxiety.  Suspect this is worsening her afib Plan: DC daytime Ativan as patient states makes her too lethargic Initiate low-dose p.o. Xanax for daytime anxiety control Continue p.o. amiodarone 200 twice daily, can possibly increase  to 400 twice daily if larger dose needed for rate control As needed Cardizem pushes for sustained tachycardia Eliquis for VTE prophylaxis  Essential hypertension Cozaar Toprol  GERD PPI  Anxiety Daytime Ativan discontinued Initiate Xanax 25 mg p.o. 3 times daily as needed  Functional decline Frequent readmissions The patient had expressed an interest in engaging hospice services due to decline of function and frequent readmissions.  She would like to avoid being hospitalized in the future.  I agree with her assessment.  We have engaged consultation from both hospice liaison as well as palliative care services. Plan: At time of this note patient would like to continue pushing forward with aggressive treatment Per previous pulmonary recommendation patient has had similar improvements on previous hospitalizations and subsequent deterioration upon return home.  If patient is not interested in hospice services at the moment strongly recommend outpatient palliative care services  DVT prophylaxis: Eliquis Code Status: DNR Family Communication: None today.  Offered to call but patient declined Disposition Plan: Status is: Inpatient  Remains inpatient appropriate because:Inpatient level of care appropriate due to severity of illness  Dispo: The patient is from: Home              Anticipated d/c is to:  TBD              Patient currently is not medically stable to d/c.   Difficult to place patient No  Complex presentation.  Acute flare of bronchiectasis with suspected pneumonia.  Remains on IV antibiotics.  Also rapid atrial fibrillation with difficult to control rate.  Patient being followed by pulmonary and cardiology.     Level of care: Progressive Cardiac  Consultants:  Cardiology, [facility] clinic Pulmonology  Procedures: None  Antimicrobials:  Cefepime   Subjective: Patient seen and examined.  Resting in bed.  No visible distress.  Reports respiratory status is  improving.  Objective: Vitals:   08/03/20 1639 08/03/20 2039 08/04/20 0826 08/04/20 1146  BP: (!) 134/94 (!) 136/92 (!) 119/91 (!) 139/100  Pulse: (!) 109 98 (!) 108 98  Resp:  16    Temp: 97.9 F (36.6 C) 98.1 F (36.7 C) 98 F (36.7 C) 97.6 F (36.4 C)  TempSrc:  Oral    SpO2: 100% 98% 100% 100%  Weight:      Height:        Intake/Output Summary (Last 24 hours) at 08/04/2020 1153 Last data filed at 08/03/2020 2300 Gross per 24 hour  Intake 120 ml  Output 300 ml  Net -180 ml   Filed Weights   07/30/20 2003 07/31/20 2200  Weight: 49.9 kg 50.1 kg    Examination:  General exam: No apparent distress.  Appears frail Respiratory system: Scattered crackles bilaterally.  Normal work of breathing.  2 L Cardiovascular system: Regular rate, irregular rhythm, no murmurs Gastrointestinal system: Abdomen is nondistended, soft and nontender. No organomegaly or masses felt. Normal bowel sounds heard. Central nervous system: Alert and oriented. No focal neurological deficits. Extremities: Symmetric 5 x 5 power. Skin: Pale.  No obvious rashes or lesions Psychiatry: Judgement and insight appear normal. Mood & affect appropriate.     Data Reviewed: I have personally reviewed following labs and imaging studies  CBC: Recent Labs  Lab 07/30/20 2035 07/31/20 0436 08/01/20 0521 08/02/20 0644 08/03/20 0607 08/04/20 0729  WBC 17.6* 11.8* 12.8* 10.3 13.8* 17.2*  NEUTROABS 14.2*  --  11.4* 9.1* 12.0* 14.6*  HGB 11.3* 9.9* 10.0* 9.6* 9.2* 10.3*  HCT 31.9* 29.1* 27.6* 27.1* 26.7* 29.7*  MCV 96.1 99.0 92.9 95.8 97.1 97.1  PLT 309 233 276 252 257 355   Basic Metabolic Panel:  Recent Labs  Lab 07/31/20 0436 08/01/20 0521 08/02/20 0644 08/03/20 0607 08/04/20 0729  NA 131* 131* 130* 130* 126*  K 3.5 4.0 5.0 5.0 5.4*  CL 93* 91* 91* 91* 85*  CO2 32 32 33* 32 32  GLUCOSE 107* 149* 131* 124* 84  BUN 6* 9 12 25* 26*  CREATININE 0.32* 0.42* 0.47 0.55 0.59  CALCIUM 8.1* 8.7* 8.8* 8.9  8.8*   GFR: Estimated Creatinine Clearance: 48.1 mL/min (by C-G formula based on SCr of 0.59 mg/dL). Liver Function Tests: Recent Labs  Lab 07/30/20 2035  AST 26  ALT 17  ALKPHOS 43  BILITOT 0.7  PROT 6.0*  ALBUMIN 3.2*   No results for input(s): LIPASE, AMYLASE in the last 168 hours. No results for input(s): AMMONIA in the last 168 hours. Coagulation Profile: Recent Labs  Lab 07/30/20 2035  INR 1.4*   Cardiac Enzymes: No results for input(s): CKTOTAL, CKMB, CKMBINDEX, TROPONINI in the last 168 hours. BNP (last 3 results) No results for input(s): PROBNP in the last 8760 hours. HbA1C: No results for input(s): HGBA1C in the last 72 hours. CBG: No results for input(s): GLUCAP in the last 168 hours. Lipid Profile: No results for input(s): CHOL, HDL, LDLCALC, TRIG, CHOLHDL, LDLDIRECT in the last 72 hours. Thyroid Function Tests: No results for input(s): TSH, T4TOTAL, FREET4, T3FREE, THYROIDAB in the last 72 hours. Anemia Panel: No results for input(s): VITAMINB12, FOLATE, FERRITIN, TIBC, IRON, RETICCTPCT in the last 72 hours. Sepsis Labs: Recent Labs  Lab 07/30/20 2035 07/31/20 1051 08/01/20 0521 08/02/20 0644  PROCALCITON  --  <0.10 <0.10 <0.10  LATICACIDVEN 1.0  --   --   --     Recent Results (from the past 240 hour(s))  Blood culture (single)     Status: None   Collection Time: 07/30/20  8:36 PM   Specimen: BLOOD  Result Value Ref Range Status   Specimen Description BLOOD BLOOD RIGHT FOREARM  Final   Special Requests   Final    BOTTLES DRAWN AEROBIC AND ANAEROBIC Blood Culture adequate volume   Culture   Final    NO GROWTH 5 DAYS Performed at [facility], [address] [place], [place] [address]    Report Status 08/04/2020 FINAL  Final  Culture, blood (single)     Status: None   Collection Time: 07/30/20  9:31 PM   Specimen: BLOOD  Result Value Ref Range Status   Specimen Description BLOOD BLOOD LEFT FOREARM  Final   Special Requests    Final    BOTTLES DRAWN AEROBIC AND ANAEROBIC Blood Culture adequate volume   Culture   Final    NO GROWTH 5 DAYS Performed at [facility], [address] [place], [place] [address]    Report Status 08/04/2020 FINAL  Final  CULTURE, BLOOD (ROUTINE X 2) w Reflex to ID Panel     Status: None (Preliminary result)   Collection Time: 07/31/20  9:42 AM   Specimen: BLOOD  Result Value Ref Range Status   Specimen Description BLOOD  LEFT AC  Final   Special Requests   Final    BOTTLES DRAWN AEROBIC AND ANAEROBIC Blood Culture results may not be optimal due to an excessive volume of blood received in culture bottles   Culture   Final    NO GROWTH 4 DAYS Performed at [facility], [address] [place], [place] [address]    Report Status PENDING  Incomplete  CULTURE, BLOOD (ROUTINE X 2) w  Reflex to ID Panel     Status: None (Preliminary result)   Collection Time: 07/31/20 10:27 AM   Specimen: BLOOD  Result Value Ref Range Status   Specimen Description BLOOD L FOREARM  Final   Special Requests   Final    BOTTLES DRAWN AEROBIC AND ANAEROBIC Blood Culture results may not be optimal due to an excessive volume of blood received in culture bottles   Culture   Final    NO GROWTH 4 DAYS Performed at [facility], [address], [place], [place] [address]    Report Status PENDING  Incomplete         Radiology Studies: DG Chest Port 1 View  Result Date: 08/02/2020 CLINICAL DATA:  Chronic hypoxemia. EXAM: PORTABLE CHEST 1 VIEW COMPARISON:  07/30/2020 FINDINGS: Stable cardiomediastinal contours. Postoperative changes with suture chains noted in the perihilar right lung and right upper lobe as well as the left midlung and perihilar left mid lung. Diffuse chronic interstitial advanced chronic interstitial changes are identified throughout both lungs reflecting underlying postinflammatory changes including bronchiectasis, interstitial scarring and reticulation. Small  right pleural effusion unchanged. Large hiatal hernia is identified which overlies the left heart and left lower lung. Unchanged. IMPRESSION: 1. Severe chronic interstitial lung disease appears unchanged from previous exam. 2. Stable appearance of large hiatal hernia. Electronically Signed   By: Kerby Moors M.D.   On: 08/02/2020 12:31        Scheduled Meds:  acetaminophen  650 mg Oral QPM   acetylcysteine  2 mL Nebulization BID   acidophilus  1 capsule Oral Daily   amiodarone  200 mg Oral BID   apixaban  2.5 mg Oral BID   vitamin C  1,000 mg Oral Daily   digoxin  0.125 mg Oral Daily   gabapentin  300 mg Oral TID   guaiFENesin  600 mg Oral BID   levalbuterol  0.63 mg Nebulization BID   methylPREDNISolone (SOLU-MEDROL) injection  30 mg Intravenous Daily   metoprolol tartrate  12.5 mg Oral BID   polyethylene glycol  17 g Oral Daily   senna  2 tablet Oral QHS   spironolactone  25 mg Oral Daily   Continuous Infusions:  sodium chloride 25 mL (08/03/20 1111)   ceFEPime (MAXIPIME) IV 2 g (08/04/20 1003)     LOS: 5 days    Time spent: 25 minutes    Sidney Ace, MD [facility] Pager [phone]  If 7PM-7AM, please contact night-coverage 08/04/2020, 11:53 AM

## 2020-08-04 NOTE — Progress Notes (Signed)
[facility] Liaison RN note  Received request from [facility] [place] and Dr. Priscella Mann for hospice services at home after discharge. Chart and patient information under review by [facility] physician. Hospice eligibility pending at this time.  Met with Ms. Cartelli at bedside to initiate education related to hospice philosophy, services and team approach to care. Patient verbalized understanding of information given. Per discussion, the plan is for discharge home by private vehicle tomorrow 7.6.22.  DME needs discussed. Patient has O2, nebulizer, chest therapy vest, wheelchair X 2, walker, BSC and shower chair in the home. She is considering if she has other DME needs and will be addressed at her admission visit to hospice in the home.  Please send signed and completed DNR home with the patient at discharge.  Please provide prescriptions at discharge as needed to ensure ongoing symptom management.  [facility] information and contact numbers given to Ms. Embleton.  Above information shared with [facility] with TOC.  Please call with any hospice related questions or concerns.  Thank you for the opportunity to participate in this patient's care.  Margaretmary Eddy, BSN, RN [facility] Liaison [phone]

## 2020-08-04 NOTE — Progress Notes (Signed)
Pulmonary and Critical Care Medicine          Date: 08/04/2020,   MRN# [id number] Ana Washington 01/11/1946     AdmissionWeight: 49.9 kg                 CurrentWeight: 50.1 kg   Referring physician: Sherrie George FNP   CHIEF COMPLAINT:   Acute exacerbation of bronchiectasis due to [place]   This is a very pleasant female with hx of permanent AF, severe bronchiectasis chronically with chronic hypoxemia.  She uses Tobramycin nebulized therapy daily and has been on cipro po in the past for >20 years.  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  She is generally on 3L/min nasal canula and is slowly ambulatory.  Patient came in to hospital due to worsening dyspnea and hypoxemia.   08/01/20- patient seen and evaluated at bedside. She seems improved less labored, she had tachypnea post-prandially only finishing 1/4 meal.  CT Abd showing pleural effusions bilaterally I have ordered lasix today bp is adequate.  H/h is stable but no bm, there is diverticulosis some concern for GIB patient has not had bm though in 24h. FOBT is ordered.  She remains in AF, she reported visual hallucinations post sleeping aid, she has not had digoxin today. Added po amio to reduce volume of infusion. NA is low , changed diet to regular to allow patient to eat more and hopefully improve sodium since shes with episodes of altered mental status.  Long term prognosis is poor unfortunately.   08/02/20-  Patient is slighly improved , appreciate cardiology management and [place] collaboration.  She is able to walk around hallway.  Family at bedside we reviewed medical plan. She had big BM said it was very dark concern for GI blood loss, FOBT was ordered but not performed, have reordered FOBT.   08/04/20-patient slightly improved from previous, saturating 100% on nasal cannula.  Was unable to tolerate MetaNeb with  respiratory therapist for recruitment.  She also met with palliative care team and wishes to continue to work to get home, remains DNR CODE STATUS.  Despite having positive outcomes during this hospitalization patient remains with severe multiorgan level comorbid status and is still appropriate for hospice.  She had similar improvement on 3 previous hospitalizations with subsequent decline once independent at home and is difficult to optimize even with an entire medical care team inpatient therefore my recommendation is unfortunately to proceed with hospice.  I do think that patient is improving however this will likely be a temporary and transient symptomatic recovery.  PAST MEDICAL HISTORY   Past Medical History:  Diagnosis Date  . Arthritis   . Atrial fibrillation ([place])   . Bronchiectasis ([place])   . CAD (coronary artery disease) 07/30/2017  . Dumping syndrome   . 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 Family history of adverse reaction to anesthesia    sister [name]  . GERD (gastroesophageal reflux disease)   . Headache    MIGRAINES  . Myocardial infarction ([place]) 2007   Non-STEMI  . [name] (postoperative nausea and vomiting)   . Psoriasis   . PUD (peptic ulcer disease)      SURGICAL HISTORY   Past Surgical History:  Procedure Laterality Date  . BACK SURGERY    . CHOLECYSTECTOMY    . ESOPHAGOGASTRODUODENOSCOPY (EGD) WITH PROPOFOL N/A 03/19/2019   Procedure: ESOPHAGOGASTRODUODENOSCOPY (EGD) WITH PROPOFOL;  Surgeon: [name], MD;  Location: [facility] ENDOSCOPY;  Service: Gastroenterology;  Laterality: N/A;  *Note to anesthesia: Per pt's pulmonologist, if intubating, please extubate to BIPAP.  [name] EYE SURGERY    . FOOT SURGERY    . INTRAMEDULLARY (IM) NAIL INTERTROCHANTERIC Right 09/30/2018   Procedure: INTRAMEDULLARY (IM) NAIL INTERTROCHANTRIC;  Surgeon: Dereck Leep, MD;  Location: [facility] ORS;  Service: Orthopedics;  Laterality: Right;  . KYPHOPLASTY N/A 07/05/2016   Procedure:  KYPHOPLASTY T - 9;  Surgeon: Hessie Knows, MD;  Location: [facility] ORS;  Service: Orthopedics;  Laterality: N/A;  . KYPHOPLASTY N/A 11/29/2017   Procedure: Iona Hansen;  Surgeon: Hessie Knows, MD;  Location: [facility] ORS;  Service: Orthopedics;  Laterality: N/A;  L2 and L3  . KYPHOPLASTY N/A 12/18/2017   Procedure: KYPHOPLASTY L1;  Surgeon: Hessie Knows, MD;  Location: [facility] ORS;  Service: Orthopedics;  Laterality: N/A;  . KYPHOPLASTY N/A 01/05/2018   Procedure: KYPHOPLASTY-T11,T12;  Surgeon: Hessie Knows, MD;  Location: [facility] ORS;  Service: Orthopedics;  Laterality: N/A;  . KYPHOPLASTY N/A 04/05/2018   Procedure: T10 KYPHOPLASTY;  Surgeon: Hessie Knows, MD;  Location: [facility] ORS;  Service: Orthopedics;  Laterality: N/A;  . KYPHOPLASTY N/A 04/12/2018   Procedure: KYPHOPLASTY T7,8;  Surgeon: Hessie Knows, MD;  Location: [facility] ORS;  Service: Orthopedics;  Laterality: N/A;  . KYPHOPLASTY N/A 04/19/2018   Procedure: KYPHOPLASTY T5, T6;  Surgeon: Hessie Knows, MD;  Location: [facility] ORS;  Service: Orthopedics;  Laterality: N/A;  . LUNG SURGERY  1990 and 1996  . THOROCOTOMY WITH LOBECTOMY     LEFT LOWER THORACOTOMY / RIGHT MIDDLE LOBECTOMY     FAMILY HISTORY   Family History  Problem Relation Age of Onset  . Hypertension Mother   . Hypertension Father      SOCIAL HISTORY   Social History   Tobacco Use  . Smoking status: Never  . Smokeless tobacco: Never  Vaping Use  . Vaping Use: Never used  Substance Use Topics  . Alcohol use: No  . Drug use: No     MEDICATIONS    Home Medication:  REM   Current Medication:  Current Facility-Administered Medications:  .  0.9 %  sodium chloride infusion, , Intravenous, PRN, Priscella Mann, Sudheer B, MD, Last Rate: 10 mL/hr at 08/03/20 1111, 25 mL at 08/03/20 1111 .  acetaminophen (TYLENOL) tablet 650 mg, 650 mg, Oral, Q6H PRN, 650 mg at 08/03/20 2122 **OR** acetaminophen (TYLENOL) suppository 650 mg, 650 mg, Rectal, Q6H PRN, Mansy, Jan A, MD .   acetaminophen (TYLENOL) tablet 650 mg, 650 mg, Oral, QPM, Teodoro Spray, MD, 650 mg at 08/03/20 1652 .  acetylcysteine (MUCOMYST) 20 % nebulizer / oral solution 2 mL, 2 mL, Nebulization, BID, Lanney Gins, Djuana Littleton, MD, 4 mL at 08/04/20 0805 .  acidophilus (RISAQUAD) capsule 1 capsule, 1 capsule, Oral, Daily, Mansy, Jan A, MD, 1 capsule at 08/03/20 0900 .  amiodarone (PACERONE) tablet 200 mg, 200 mg, Oral, BID, Ottie Glazier, MD, 200 mg at 08/03/20 2122 .  apixaban (ELIQUIS) tablet 2.5 mg, 2.5 mg, Oral, BID, Trace Cederberg, MD, 2.5 mg at 08/03/20 2123 .  ascorbic acid (VITAMIN C) tablet 1,000 mg, 1,000 mg, Oral, Daily, Mansy, Jan A, MD, 1,000 mg at 08/03/20 0901 .  ceFEPIme (MAXIPIME) 2 g in sodium chloride 0.9 % 100 mL IVPB, 2 g, Intravenous, Q12H, Darnelle Bos, RPH, Last Rate: 200 mL/hr at 08/03/20 2124, 2 g at 08/03/20 2124 .  digoxin (LANOXIN) tablet 0.125 mg, 0.125 mg, Oral, Daily, Kaulin Chaves,  MD, 0.125 mg at 08/03/20 1302 .  gabapentin (NEURONTIN) capsule 300 mg, 300 mg, Oral, TID, Renda Rolls, RPH, 300 mg at 08/03/20 2122 .  guaiFENesin (MUCINEX) 12 hr tablet 600 mg, 600 mg, Oral, BID, Mansy, Jan A, MD, 600 mg at 08/03/20 2122 .  levalbuterol (XOPENEX) nebulizer solution 0.63 mg, 0.63 mg, Nebulization, BID, Lanney Gins, Dedria Endres, MD, 0.63 mg at 08/04/20 0805 .  levalbuterol (XOPENEX) nebulizer solution 1.5 mL, 1.5 mL, Nebulization, Q4H PRN, Sreenath, Sudheer B, MD .  LORazepam (ATIVAN) tablet 0.5 mg, 0.5 mg, Oral, Q6H PRN, Priscella Mann, Sudheer B, MD, 0.5 mg at 08/03/20 2123 .  magnesium hydroxide (MILK OF MAGNESIA) suspension 30 mL, 30 mL, Oral, Daily PRN, Mansy, Jan A, MD .  methylPREDNISolone sodium succinate (SOLU-MEDROL) 40 mg/mL injection 30 mg, 30 mg, Intravenous, Daily, Gearline Spilman, MD .  metoprolol tartrate (LOPRESSOR) tablet 12.5 mg, 12.5 mg, Oral, BID, Lanney Gins, Daritza Brees, MD, 12.5 mg at 08/03/20 2123 .  oxybutynin (DITROPAN) tablet 5 mg, 5 mg, Oral, Q8H PRN, Sreenath, Sudheer B,  MD .  oxyCODONE (Oxy IR/ROXICODONE) immediate release tablet 5 mg, 5 mg, Oral, Q4H PRN, Sreenath, Sudheer B, MD .  polyethylene glycol (MIRALAX / GLYCOLAX) packet 17 g, 17 g, Oral, Daily, Sreenath, Sudheer B, MD, 17 g at 08/03/20 0902 .  senna (SENOKOT) tablet 17.2 mg, 2 tablet, Oral, QHS, Marian Meneely, MD, 8.6 mg at 08/03/20 2122 .  spironolactone (ALDACTONE) tablet 25 mg, 25 mg, Oral, Daily, Mansy, Jan A, MD, 25 mg at 08/03/20 0901    ALLERGIES   Codeine, Sulfa antibiotics, and Penicillins     REVIEW OF SYSTEMS    Review of Systems:  Gen:  Denies  fever, sweats, chills weigh loss  HEENT: Denies blurred vision, double vision, ear pain, eye pain, hearing loss, nose bleeds, sore throat Cardiac:  No dizziness, chest pain or heaviness, chest tightness,edema Resp:   Denies cough or sputum porduction, shortness of breath,wheezing, hemoptysis,  Gi: Denies swallowing difficulty, stomach pain, nausea or vomiting, diarrhea, constipation, bowel incontinence Gu:  Denies bladder incontinence, burning urine Ext:   Denies Joint pain, stiffness or swelling Skin: Denies  skin rash, easy bruising or bleeding or hives Endoc:  Denies polyuria, polydipsia , polyphagia or weight change Psych:   Denies depression, insomnia or hallucinations   Other:  All other systems negative   VS: BP (!) 119/91 (BP Location: Right Arm)   Pulse (!) 108   Temp 98 F (36.7 C)   Resp 16   Ht _0  (1.651 m)   Wt 50.1 kg   SpO2 100%   BMI 18.38 kg/m      PHYSICAL EXAM    GENERAL:NAD, no fevers, chills, no weakness no fatigue HEAD: Normocephalic, atraumatic.  EYES: Pupils equal, round, reactive to light. Extraocular muscles intact. No scleral icterus.  MOUTH: Moist mucosal membrane. Dentition intact. No abscess noted.  EAR, NOSE, THROAT: Clear without exudates. No external lesions.  NECK: Supple. No thyromegaly. No nodules. No JVD.  PULMONARY: rhonchi bilaterally  CARDIOVASCULAR: S1 and S2. Regular  rate and rhythm. No murmurs, rubs, or gallops. No edema. Pedal pulses 2+ bilaterally.  GASTROINTESTINAL: Soft, nontender, nondistended. No masses. Positive bowel sounds. No hepatosplenomegaly.  MUSCULOSKELETAL: No swelling, clubbing, or edema. Range of motion full in all extremities.  NEUROLOGIC: Cranial nerves II through XII are intact. No gross focal neurological deficits. Sensation intact. Reflexes intact.  SKIN: No ulceration, lesions, rashes, or cyanosis. Skin warm and dry. Turgor intact.  PSYCHIATRIC: Mood, affect within  normal limits. The patient is awake, alert and oriented x 3. Insight, judgment intact.       IMAGING    CT ABDOMEN PELVIS WO CONTRAST  Result Date: 07/31/2020 CLINICAL DATA:  Lower abdominal pain.  Tachycardia and tachypnea. EXAM: CT ABDOMEN AND PELVIS WITHOUT CONTRAST TECHNIQUE: Multidetector CT imaging of the abdomen and pelvis was performed following the standard protocol without IV contrast. COMPARISON:  12/25/2018 and prior CT a chest 07/30/2020 FINDINGS: Lower chest: Stable large hiatal hernia containing most of the stomach. Extensive cylindrical bronchiectasis, airway thickening, and some airway plugging in the lung bases as shown on the exam from 07/30/2020. A small right pleural effusion is mildly increased from 07/30/2020. Mild cardiomegaly noted along with coronary artery and descending thoracic aortic atherosclerotic calcification. High-density material along the left lung base pleural margin, stable. Hepatobiliary: Cholecystectomy. Prominent common bile duct at 1.3 cm in diameter on image 33 series 5, previously the same on 12/25/2018, this biliary dilatation may represent a physiologic response to cholecystectomy. Pancreas: Unremarkable Spleen: Old granulomatous disease. Adrenals/Urinary Tract: Accentuated density in both renal collecting systems probably left over from recent contrast injection. Stable left mid kidney exophytic lesion compatible with cyst. Contrast  medium in the urinary bladder. Adrenal glands unremarkable. Stomach/Bowel: Large hiatal hernia containing the majority of the stomach as well as part of the transverse colon and splenic flexure. No findings of strangulation or obstruction, this herniation is chronic. Sigmoid colon diverticulosis without findings of active diverticulitis. Vascular/Lymphatic: Aortoiliac atherosclerotic vascular disease. Reproductive: Small calcified posterior uterine body fibroid. Other: No supplemental non-categorized findings. Musculoskeletal: Bony demineralization. Deformities from old bilateral pelvic fractures, stable from 12/25/2018. Right hip ORIF. Compression fractures at all lumbar levels and all visualized lower thoracic levels, with vertebral augmentations at all visible lower thoracic levels and at L1, L2, and L3. The lumbar compression fractures at L4 and L5 appear similar to the 12/25/2018 exam. IMPRESSION: 1. A specific cause for patient's lower abdominal pain is not identified. 2. Small right pleural effusion, but increased in size compared to 07/30/2020. 3. Other imaging findings of potential clinical significance: Large hiatal hernia containing most of the stomach as well as part of the transverse colon. Prominent cylindrical bronchiectasis in both lower lobes with scattered airway plugging. Mild cardiomegaly. Aortic Atherosclerosis (ICD10-I70.0). Coronary atherosclerosis. Stable chronic extrahepatic biliary dilatation, possibly a physiologic response to cholecystectomy. Sigmoid diverticulosis. Small uterine fibroid. Old pelvic fractures. Chronic compression fractures at all visualized thoracic and lumbar levels with vertebral augmentations at all visualized thoracolumbar levels except for L4 and L5. Electronically Signed   By: Van Clines M.D.   On: 07/31/2020 20:56   DG Chest 1 View  Result Date: 07/30/2020 CLINICAL DATA:  COVID diagnosis 5 weeks prior, known metastatic disease, increasing shortness of  breath with productive cough EXAM: CHEST  1 VIEW COMPARISON:  Radiograph 07/14/2020, CT 07/14/2020 FINDINGS: Large left diaphragmatic eventration. Some adjacent passive atelectatic changes. Diffuse reticulonodular opacities present throughout both lungs. Postsurgical changes in the left hilum and right upper lung are stable from priors and likely reflecting multiple resections. No pneumothorax. No visible layering effusion. Stable cardiomediastinal contours with a calcified aorta. Multilevel vertebroplasty changes are seen. No acute osseous abnormality or suspicious osseous lesion. The osseous structures appear diffusely demineralized which may limit detection of small or nondisplaced fractures. Degenerative changes in the shoulders, right greater than left with high-riding humeral head suggesting underlying rotator cuff insufficiency. Telemetry leads overlie the chest. IMPRESSION: Diffuse reticulonodular opacities throughout the lungs, can be seen in  the setting as atypical mycobacterial infection as suggested previously versus additional acute superimposed infection or inflammation. Large left diaphragmatic eventration. Chronic scarring and architectural distortion with areas of postsurgical changes in the left hilum and right upper lung. Aortic Atherosclerosis (ICD10-I70.0). Multilevel vertebroplasty. Electronically Signed   By: Lovena Le M.D.   On: 07/30/2020 20:34   DG Chest 2 View  Result Date: 07/14/2020 CLINICAL DATA:  75 year old female with shortness of breath and cough. EXAM: CHEST - 2 VIEW COMPARISON:  Chest CT 04/29/2020 and earlier. FINDINGS: Seated AP and lateral views of the chest. Large chronic left diaphragmatic hernia appears stable. Stable cardiac size and mediastinal contours. Bronchiectasis better demonstrated by CT. Chronic staple line in the right upper lobe. Stable coarse bilateral pulmonary interstitial opacity. No pneumothorax, pulmonary edema, or acute pulmonary opacity. Diffuse  spinal compression fractures with at least 10 levels previously augmented. Stable visualized osseous structures. Negative visible bowel gas pattern. IMPRESSION: Chronic lung disease with bronchiectasis. Chronic left diaphragmatic hernia. No acute cardiopulmonary abnormality identified. Electronically Signed   By: Genevie Ann M.D.   On: 07/14/2020 11:54   CT Angio Chest PE W and/or Wo Contrast  Result Date: 07/30/2020 CLINICAL DATA:  Subacute COVID pneumonia, cough EXAM: CT ANGIOGRAPHY CHEST WITH CONTRAST TECHNIQUE: Multidetector CT imaging of the chest was performed using the standard protocol during bolus administration of intravenous contrast. Multiplanar CT image reconstructions and MIPs were obtained to evaluate the vascular anatomy. CONTRAST:  49m OMNIPAQUE IOHEXOL 350 MG/ML SOLN COMPARISON:  07/14/2020 FINDINGS: Cardiovascular: There is adequate opacification of the residual pulmonary arterial tree. No intraluminal filling defect to suggest acute pulmonary embolism. The central pulmonary arteries are enlarged in keeping with changes of pulmonary arterial hypertension, unchanged from prior examination. Mild multi-vessel coronary artery calcification. Global cardiac size within normal limits. No pericardial effusion. Moderate atherosclerotic calcification within the abdominal aorta. No aortic aneurysm. Mediastinum/Nodes: No pathologic thoracic adenopathy. The visualized thyroid is unremarkable. Small amount of debris is seen within the esophagus possibly reflecting changes of esophageal dysmotility or gastroesophageal reflux. A large hiatal hernia is seen extending into the left hemithorax with herniation of the splenic flexure of the colon and nearly the entire stomach into the left hemithorax. There is marked tortuosity of the thoracic aorta which extends into the posterior left hemithorax as result of the a large hernia. Lungs/Pleura: Status post partial right upper lobectomy and right middle lobectomy as  well as left lower lobectomy. 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 There is superimposed areas of cylindrical and varicoid bronchiectasis again identified, more focal within the residual right upper lobe and basilar right lower lobe though chronic infection with atypical organisms such as MAI could result in such an appearance, an additional consideration should include the sequela of remote or recurrent infection and/or aspiration. There is these changes appears stable since prior examination. No new focal pulmonary nodules or infiltrates. No pneumothorax or pleural effusion. Upper Abdomen: Status post cholecystectomy.  No acute abnormality. Musculoskeletal: Multilevel thoracolumbar vertebroplasty has been performed of each visualized level inferior to T4. Stable remote compression fracture of T3. No acute bone abnormality. Review of the MIP images confirms the above findings. IMPRESSION: No pulmonary embolism. Mild coronary artery calcification. Morphologic changes in keeping with pulmonary arterial hypertension. Status post partial right upper lobe, right middle lobe, and left lower lobectomy. Extensive scattered areas of cylindrical and varicoid bronchiectasis with mild tree-in-bud nodularity stable since prior examination.  Differential considerations include the sequela of remote or recurrent infection, aspiration, and atypical infection as can be seen with mycobacterial or atypical fungal infection. Mosaic attenuation of the pulmonary parenchyma in keeping with air trapping secondary to small airways disease. Aortic Atherosclerosis (ICD10-I70.0). Electronically Signed   By: Fidela Salisbury MD   On: 07/30/2020 22:16   CT Angio Chest PE W and/or Wo Contrast  Result Date: 07/14/2020 CLINICAL DATA:  PE suspected, high prob EXAM: CT ANGIOGRAPHY CHEST WITH CONTRAST TECHNIQUE: Multidetector CT imaging of the chest was  performed using the standard protocol during bolus administration of intravenous contrast. Multiplanar CT image reconstructions and MIPs were obtained to evaluate the vascular anatomy. CONTRAST:  55m OMNIPAQUE IOHEXOL 350 MG/ML SOLN COMPARISON:  Chest CT 04/29/2020 FINDINGS: Cardiovascular: Satisfactory opacification of the pulmonary arteries to the segmental level. No evidence of pulmonary embolism. Unchanged enlarged central pulmonary arteries compatible with pulmonary hypertension. Unchanged mild cardiomegaly. Atherosclerotic calcifications of the aorta. Coronary artery calcifications. Mediastinum/Nodes: There is no mediastinal, hilar, or axillary lymphadenopathy. Thyroid is unremarkable. There is a large hiatal hernia containing the a hepatic flexure of the colon and the stomach, similar to prior exam. Lungs/Pleura: Diffuse bronchial wall thickening with multifocal bronchiectasis, most severe in the right upper lobe and lung bases bilaterally. Multifocal mucous plugging. Similar architectural distortion in the of the left lower lobe. Prior resections of the lateral segment of the right upper lobe, the right middle lobe, and inferior lingula. There is small right-sided subpleural nodularity, increased from prior exam, largest measuring 2.0 x 0.6 cm, and another measuring 0.9 x 0.8 cm (series 7, images 53 and 41 respectively). Similar subpleural nodularity in the left upper lobe. Scattered lung scarring and tree-in-bud nodularity. Mosaic attenuation bilaterally, likely due to air trapping. Upper Abdomen: Unchanged left renal cyst. Prior cholecystectomy. No acute abnormality. Musculoskeletal: Unchanged multiple thoracolumbar compression deformities with vertebroplasties. Unchanged chronic T3 compression deformity. Partially visualized severe degenerative changes in the lower cervical spine. Review of the MIP images confirms the above findings. IMPRESSION: No evidence of pulmonary embolism. Unchanged enlarged  pulmonary arteries suggestive of pulmonary hypertension. 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 Increased subpleural nodularity on the right, which may represent progression of disease. Chronic mosaic attenuation of the lungs likely due to air trapping. Recommend follow-up chest CT in 3 months and follow-up with pulmonology. Aortic Atherosclerosis (ICD10-I70.0). Electronically Signed   By: JMaurine Simmering  On: 07/14/2020 13:33   DG Chest Port 1 View  Result Date: 08/02/2020 CLINICAL DATA:  Chronic hypoxemia. EXAM: PORTABLE CHEST 1 VIEW COMPARISON:  07/30/2020 FINDINGS: Stable cardiomediastinal contours. Postoperative changes with suture chains noted in the perihilar right lung and right upper lobe as well as the left midlung and perihilar left mid lung. Diffuse chronic interstitial advanced chronic interstitial changes are identified throughout both lungs reflecting underlying postinflammatory changes including bronchiectasis, interstitial scarring and reticulation. Small right pleural effusion unchanged. Large hiatal hernia is identified which overlies the left heart and left lower lung. Unchanged. IMPRESSION: 1. Severe chronic interstitial lung disease appears unchanged from previous exam. 2. Stable appearance of large hiatal hernia. Electronically Signed   By: TKerby MoorsM.D.   On: 08/02/2020 12:31      ASSESSMENT/PLAN    Acute exacerbation of bronchiectasis  - empirically on zithromax and cefepime for possible sepsis    - agree with current antibiotics    S/p COVID19 pneumonia -mild residual on CT from this admission  Chronic AF    - continue eliquis - no hemoptysis -started amio stopped bb and dig  Appreciate cardiology - Dr Ubaldo Glassing -reduced eliquis to 2.5 due to diverticulosis on ct abd and reduction in hb -amio po 200 bid transition from gtt  Acute blood loss  anemia  FOBT   - on eliquis - 5 po bid>>2.5 bid   End of life care  - palliative consultation and hospice consultation    Thank you for allowing me to participate in the care of this patient.   Patient/Family are satisfied with care plan and all questions have been answered.   This document was prepared using Dragon voice recognition software and may include unintentional dictation errors.     Ottie Glazier, M.D.  Division of [place]

## 2020-08-04 NOTE — Progress Notes (Signed)
Physical Therapy Treatment Patient Details Name: Ana Washington MRN: [id number] DOB: 12/29/1945 Today's Date: 08/04/2020    History of Present Illness Ana Washington is a 75 y.o. 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    PT Comments    Pt was long sitting in bed on O2. She greets Chief Strategy Officer and is extremely motivated/cooperative throughout session. Resting HR 102 BPM with sao2 94%. She is eager for OOB activity. Easily able to exit bed, stand, and ambulate 2 x ~ 30 ft. Fatigue/activity tolerance most limiting. HR peak during ambulation at 115 bpm. Pt endorses slight SOB that resolved quickly. Overall pt is progressing. Recommend DC home with HHPT to follow when stable. Pt has caregivers for several hours daily.     Follow Up Recommendations  Home health PT;Supervision - Intermittent     Equipment Recommendations  None recommended by PT       Precautions / Restrictions Precautions Precautions: Fall Restrictions Weight Bearing Restrictions: No    Mobility  Bed Mobility Overal bed mobility: Modified Independent Bed Mobility: Supine to Sit     Supine to sit: Modified independent (Device/Increase time);HOB elevated (HOB elevated ~ 15 degrees)          Transfers Overall transfer level: Needs assistance Equipment used: Rolling walker (2 wheeled) Transfers: Sit to/from Stand Sit to Stand: Supervision         General transfer comment: Pt demonstrated safe abiolity to STS from EOB and from recliner  Ambulation/Gait Ambulation/Gait assistance: Supervision Gait Distance (Feet): 30 Feet Assistive device: Rolling walker (2 wheeled) Gait  Pattern/deviations: Step-through pattern Gait velocity: WFL   General Gait Details: Pt was able to ambulate 2 bouts of 30 ft with HR < 115 BPM and sao2 > 92 % on 2 L [place]       Balance Overall balance assessment: Needs assistance Sitting-balance support: Feet supported Sitting balance-Leahy Scale: Good     Standing balance support: Bilateral upper extremity supported;During functional activity Standing balance-Leahy Scale: Good Standing balance comment: no LOB with use of RW     Cognition Arousal/Alertness: Awake/alert Behavior During Therapy: WFL for tasks assessed/performed Overall Cognitive Status: Within Functional Limits for tasks assessed        General Comments: [name] x 4             Pertinent Vitals/Pain Pain Assessment: No/denies pain     PT Goals (current goals can now be found in the care plan section) Acute Rehab PT Goals Patient Stated Goal: go home Progress towards PT goals: Progressing toward goals    Frequency    Min 2X/week      PT Plan Current plan remains appropriate       AM-PAC PT "6 Clicks" Mobility   Outcome Measure  Help needed turning from your back to your side while in a flat bed without using bedrails?: None Help needed moving from lying on your back to sitting on the side of a flat bed without using bedrails?: A Little Help needed moving to and from a bed to a chair (including a wheelchair)?: A Little Help needed standing up from a chair using your arms (e.g., wheelchair or bedside chair)?: A Little Help needed to  walk in hospital room?: A Little Help needed climbing 3-5 steps with a railing? : A Little 6 Click Score: 19    End of Session Equipment Utilized During Treatment: Gait belt;Oxygen Activity Tolerance: Patient tolerated treatment well Patient left: in chair;with call bell/phone within reach;with family/visitor present;with chair alarm set Nurse Communication: Mobility status PT Visit Diagnosis: Muscle weakness  (generalized) (M62.81);Difficulty in walking, not elsewhere classified (R26.2)     Time: 3833-3832 PT Time Calculation (min) (ACUTE ONLY): 23 min  Charges:  $Gait Training: 8-22 mins $Therapeutic Activity: 8-22 mins                     Julaine Fusi PTA 08/04/20, 11:55 AM

## 2020-08-05 DIAGNOSIS — Z515 Encounter for palliative care: Secondary | ICD-10-CM | POA: Diagnosis not present

## 2020-08-05 DIAGNOSIS — Z66 Do not resuscitate: Secondary | ICD-10-CM

## 2020-08-05 DIAGNOSIS — Z7189 Other specified counseling: Secondary | ICD-10-CM | POA: Diagnosis not present

## 2020-08-05 DIAGNOSIS — J471 Bronchiectasis with (acute) exacerbation: Secondary | ICD-10-CM | POA: Diagnosis not present

## 2020-08-05 LAB — CBC WITH DIFFERENTIAL/PLATELET
Abs Immature Granulocytes: 0.43 10*3/uL — ABNORMAL HIGH (ref 0.00–0.07)
Basophils Absolute: 0 10*3/uL (ref 0.0–0.1)
Basophils Relative: 0 %
Eosinophils Absolute: 0 10*3/uL (ref 0.0–0.5)
Eosinophils Relative: 0 %
HCT: 29.1 % — ABNORMAL LOW (ref 36.0–46.0)
Hemoglobin: 10.2 g/dL — ABNORMAL LOW (ref 12.0–15.0)
Immature Granulocytes: 2 %
Lymphocytes Relative: 7 %
Lymphs Abs: 1.3 10*3/uL (ref 0.7–4.0)
MCH: 32.9 pg (ref 26.0–34.0)
MCHC: 35.1 g/dL (ref 30.0–36.0)
MCV: 93.9 fL (ref 80.0–100.0)
Monocytes Absolute: 1.1 10*3/uL — ABNORMAL HIGH (ref 0.1–1.0)
Monocytes Relative: 6 %
Neutro Abs: 16.1 10*3/uL — ABNORMAL HIGH (ref 1.7–7.7)
Neutrophils Relative %: 85 %
Platelets: 283 10*3/uL (ref 150–400)
RBC: 3.1 MIL/uL — ABNORMAL LOW (ref 3.87–5.11)
RDW: 12.5 % (ref 11.5–15.5)
WBC: 19 10*3/uL — ABNORMAL HIGH (ref 4.0–10.5)
nRBC: 0 % (ref 0.0–0.2)

## 2020-08-05 LAB — CULTURE, BLOOD (ROUTINE X 2)
Culture: NO GROWTH
Culture: NO GROWTH

## 2020-08-05 LAB — BASIC METABOLIC PANEL
Anion gap: 6 (ref 5–15)
BUN: 30 mg/dL — ABNORMAL HIGH (ref 8–23)
CO2: 35 mmol/L — ABNORMAL HIGH (ref 22–32)
Calcium: 8.8 mg/dL — ABNORMAL LOW (ref 8.9–10.3)
Chloride: 85 mmol/L — ABNORMAL LOW (ref 98–111)
Creatinine, Ser: 0.6 mg/dL (ref 0.44–1.00)
GFR, Estimated: >60 mL/min (ref >60)
Glucose, Bld: 101 mg/dL — ABNORMAL HIGH (ref 70–99)
Potassium: 4.8 mmol/L (ref 3.5–5.1)
Sodium: 126 mmol/L — ABNORMAL LOW (ref 135–145)

## 2020-08-05 MED ORDER — METOPROLOL TARTRATE 25 MG PO TABS
25.0000 mg | ORAL_TABLET | Freq: Two times a day (BID) | ORAL | Status: DC
  Administered 2020-08-05 – 2020-08-07 (×4): 25 mg via ORAL
  Filled 2020-08-05 (×4): qty 1

## 2020-08-05 MED ORDER — ALUM & MAG HYDROXIDE-SIMETH 200-200-20 MG/5ML PO SUSP
30.0000 mL | Freq: Once | ORAL | Status: AC
  Administered 2020-08-05: 30 mL via ORAL
  Filled 2020-08-05: qty 30

## 2020-08-05 MED ORDER — PREDNISONE 20 MG PO TABS
40.0000 mg | ORAL_TABLET | Freq: Every day | ORAL | Status: DC
  Administered 2020-08-06 – 2020-08-07 (×2): 40 mg via ORAL
  Filled 2020-08-05 (×2): qty 2

## 2020-08-05 MED ORDER — PANTOPRAZOLE SODIUM 40 MG IV SOLR
40.0000 mg | Freq: Every day | INTRAVENOUS | Status: DC
  Administered 2020-08-05 – 2020-08-06 (×2): 40 mg via INTRAVENOUS
  Filled 2020-08-05 (×2): qty 40

## 2020-08-05 NOTE — Progress Notes (Signed)
[facility] Room [facility]) Hospital Liaison note:  Notified of request for [facility] services. Will continue to follow for disposition.  Please call with any outpatient palliative questions or concerns.  Thank you for the opportunity to participate in this patient's care.  Thank you, Lorelee Market, LPN [facility] Liaison [phone]

## 2020-08-05 NOTE — Progress Notes (Signed)
Initial Nutrition Assessment  DOCUMENTATION CODES:  Not applicable  INTERVENTION:  Continue regular diet.  Continue Magic cup TID with meals, each supplement provides 290 kcal and 9 grams of protein.  NUTRITION DIAGNOSIS:  Increased nutrient needs related to acute illness (sepsis) as evidenced by estimated needs.  GOAL:  Patient will meet greater than or equal to 90% of their needs  MONITOR:  PO intake, Supplement acceptance, Labs, Weight trends, I & O's  REASON FOR ASSESSMENT:  Other (Comment) (Low BMI)    ASSESSMENT:  Pt with history of atrial fibrillation, CAD, bronchiectasis, HTN, GERD, MI, PUD, and recent treatment and evaluation of worsening COVID symptoms last month who now presents with sepsis 2/2 acute exacerbation of bronchiectasis and possible UTI.  Pt seen by RD 3 weeks ago during previous admission. Per RD note, "Pt reports that she doesn't eat much at breakfast and lunch but normally eats more at dinner time. Discussed increased needs with COVID19. Pt does not like Ensure, but is agreeable to Magic cup (orange). Discussed weight, pt reports that she lost a significant amount of weight several years ago and has not been able to gain it back. Muscle and fat stores and mostly intact."  Spoke with pt at bedside. She reiterated similar information to the above and reported she was feeling anxious and possibly short of breath. RD went to find RN quickly after.  Per Epic, pt has lost ~4.6 lbs (4%) in the last 2 weeks, which is significant and severe for the time frame.  On exam, pt's muscles still remain mostly intact.  Continue Magic Cup TID.  Medications: reviewed; Vitamin C, Ativan, miralax, prednisone, Senokot  Labs: reviewed; Na 126 (L), Glucose 101 (H)  NUTRITION - FOCUSED PHYSICAL EXAM: Flowsheet Row Most Recent Value  Orbital Region Mild depletion  Upper Arm Region No depletion  Thoracic and Lumbar Region No depletion  Buccal Region No depletion  Temple  Region Mild depletion  Clavicle Bone Region No depletion  Clavicle and Acromion Bone Region No depletion  Scapular Bone Region No depletion  Dorsal Hand Moderate depletion  Patellar Region Mild depletion  Anterior Thigh Region No depletion  Posterior Calf Region No depletion  Edema (RD Assessment) None  Hair Reviewed  Eyes Reviewed  Mouth Reviewed  Skin Reviewed  Nails Reviewed   Diet Order:   Diet Order             Diet regular Room service appropriate? Yes; Fluid consistency: Thin  Diet effective now                  EDUCATION NEEDS:  No education needs have been identified at this time  Skin:  Skin Assessment: Reviewed RN Assessment  Last BM:  08/04/20 - Type 4, large  Height:  Ht Readings from Last 1 Encounters:  07/31/20 '5\' 5"'$  (1.651 m)   Weight:  Wt Readings from Last 1 Encounters:  07/31/20 50.1 kg   Ideal Body Weight:  56.8 kg  BMI:  Body mass index is 18.38 kg/m.  Estimated Nutritional Needs:  Kcal:  [id number] Protein:  80-95 grams Fluid:  >1.75 L  Derrel Nip, RD, LDN (she/her/hers) Registered Dietitian I After-Hours/Weekend Pager # in [place]

## 2020-08-05 NOTE — Progress Notes (Signed)
[facility] RN note  Chart and patient information reviewed by Hospice MD. [facility] MD does not feel patient meets hospice eligibility requirements quite yet. Recommending outpatient palliative services at discharge.  Met with Ms. Bubel to explain and she agrees with this plan. Ms. Goering maintains that she does not desire any further hospitalizations. Explained that she can reach out to her palliative provider if/when she feels she is beginning to experience decline and be re-evaluated for hospice services at that time.   Notified care team of this decision.  Placing outpatient palliative referral with [facility].  Please call with any questions or concerns.  Thank you. Margaretmary Eddy, BSN, RN [facility] Liaison [phone]

## 2020-08-05 NOTE — Progress Notes (Signed)
Occupational Therapy Treatment Patient Details Name: Ana Washington MRN: [id number] DOB: Mar 02, 1945 Today's Date: 08/05/2020    History of present illness Ana Washington is a 75 y.o. 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   OT comments  Pt seen for OT tx this date to f/u re: safety with ADLs/ADL mobility. Pt able to perform sup to sit with MOD I. Pt requires MIN A/CGA for ADL transfers with RW this date, citing increased fatigue 2/2 having been given xanax overnight. Pt requires SETUP to MIN A for seated UB bathing (primarily requiring assist d/t fatiguing with higher reaching) and requires MIN/MOD A for LB bathing. Pt SPS to commode with RW with MIN A and requires MIN/MOD A for thorough peri care. Pt then able to take ~4-5 steps with RW from [facility] to recliner and left with LE elevated and all needs met/in reach. Pt requires cues for pacing/energy conservation throughout as she is noted to have increased RR with all transfers/activity.    Follow Up Recommendations  Home health OT;Supervision - Intermittent    Equipment Recommendations  None recommended by OT (pt has all necessary equipment)    Recommendations for Other Services      Precautions / Restrictions Precautions Precautions: Fall Precaution Comments: mod fall Restrictions Weight Bearing Restrictions: No       Mobility Bed Mobility Overal bed mobility: Modified Independent Bed Mobility: Sit to Supine     Supine to sit: Modified independent (Device/Increase time);HOB elevated Sit to supine: Min guard   General bed mobility comments: increased time, use of rails, HOB ~20 degrees     Transfers Overall transfer level: Needs assistance Equipment used: Rolling walker (2 wheeled) Transfers: Sit to/from [facility] Sit to Stand: Min assist Stand pivot transfers: Min assist       General transfer comment: increased assist todate with STS to/from commode, pt citing fatigue from xanax    Balance Overall balance assessment: Needs assistance Sitting-balance support: Feet supported Sitting balance-Leahy Scale: Good     Standing balance support: Bilateral upper extremity supported;During functional activity Standing balance-Leahy Scale: Fair Standing balance comment: requires UE support                           ADL either performed or assessed with clinical judgement   ADL Overall ADL's : Needs assistance/impaired         Upper Body Bathing: Min guard;Minimal assistance;Sitting Upper Body Bathing Details (indicate cue type and reason): assist d/t fatiguing with higher reaching tasks such as armpits/shoulders Lower Body Bathing: Minimal assistance;Moderate assistance;Sit to/from stand Lower Body Bathing Details (indicate cue type and reason): with RW for standing balance Upper Body Dressing : Set up;Sitting   Lower Body Dressing: Moderate assistance Lower Body Dressing Details (indicate cue type and reason): to thread mesh underwear with pad and socks Toilet Transfer: Min guard;Minimal assistance   Toileting- Clothing Manipulation and Hygiene: Minimal assistance;Moderate assistance               Vision Baseline Vision/History: Wears glasses Wears Glasses: At all times Patient Visual Report: No change from baseline     Perception     Praxis  Cognition Arousal/Alertness: Awake/alert Behavior During Therapy: WFL for tasks assessed/performed Overall Cognitive Status: Within Functional Limits for tasks assessed                                 General Comments: [name] x 4        Exercises Other  Exercises Other Exercises: OT engaged pt in UB/LB bathing/dressing tasks as well as commode transfer and toileting tasks with use of RW to stabilize. Pt requires cues for pacing throughout as her RR is noted to increase with all activity/transfers   Shoulder Instructions       General Comments      Pertinent Vitals/ Pain       Pain Assessment: No/denies pain  Home Living                                          Prior Functioning/Environment              Frequency  Min 2X/week        Progress Toward Goals  OT Goals(current goals can now be found in the care plan section)  Progress towards OT goals: Progressing toward goals  Acute Rehab OT Goals Patient Stated Goal: go home OT Goal Formulation: With patient Time For Goal Achievement: 08/14/20 Potential to Achieve Goals: Good  Plan Discharge plan needs to be updated    Co-evaluation                 AM-PAC OT "6 Clicks" Daily Activity     Outcome Measure   Help from another person eating meals?: None Help from another person taking care of personal grooming?: A Little Help from another person toileting, which includes using toliet, bedpan, or urinal?: A Little Help from another person bathing (including washing, rinsing, drying)?: A Little Help from another person to put on and taking off regular upper body clothing?: A Little Help from another person to put on and taking off regular lower body clothing?: A Lot 6 Click Score: 18    End of Session Equipment Utilized During Treatment: Rolling walker;Oxygen  OT Visit Diagnosis: Unsteadiness on feet (R26.81);Muscle weakness (generalized) (M62.81)   Activity Tolerance Patient tolerated treatment well;Other (comment);Patient limited by fatigue   Patient Left in chair;with call bell/phone within reach;with chair alarm set   Nurse Communication Mobility status        Time: 5809-9833 OT Time Calculation (min): 45 min  Charges: OT  General Charges $OT Visit: 1 Visit OT Treatments $Self Care/Home Management : 23-37 mins $Therapeutic Activity: 8-22 mins  Gerrianne Scale, MS, OTR/L ascom [phone] 08/05/20, 5:25 PM

## 2020-08-05 NOTE — Progress Notes (Signed)
[facility] Progress Note  Patient: Ana Washington    SAY:[id number]  DOA: 07/30/2020     Date of Service: the patient was seen and examined on 08/05/2020  Chief Complaint  Patient presents with   Shortness of Breath   Brief hospital course: 75 y.o. Caucasian female with medical history significant for coronary artery disease, essential hypertension, GERD, as well as history of bronchiectasis and chronic MAC, , chronic respiratory failure (on 2lpm via [place] at home), Takotsubo cardiomyopathy (2015 at [facility]), gastroesophageal reflux disease, hypertension, paroxysmal atrial fibrillation (on Eliquis), generalized anxiety disorder, diastolic congestive heart failure (Echo 03/2020 EF 50-55% with G1DD), with recent COVID 19 infection 16 days ago, who presented to the emergency room with acute onset of persistent cough since then with expectoration of yellowish green sputum and worsening dyspnea today as well as wheezing.  She has been feeling "very very weak".  She was admitted here recently for exacerbation of her bronchiectasis from 6/14-6/20.  She was discharged home on p.o. Levaquin and prednisone.  She finished a course of IV remdesivir when she was here   The patient is well-known to both Dr. Ubaldo Glassing and Dr. Lanney Gins.  I appreciate consultations from both of them.  Clinical presentation is likely mixed combination of exacerbation of bronchiectasis, partial contribution from suspected urinary tract infection and rapid atrial fibrillation.   Patient was in rapid atrial fibrillation on admission.  Initially controlled however occasionally becomes tachycardic and acutely symptomatic.  Responded to IV Cardizem however rate became uncontrolled shortly thereafter.  After pulmonary evaluation was started on amiodarone infusion.   Patient appears to be responding to initial therapy.  Heart rate improved on amiodarone infusion.  Patient was endorsing abdominal pain.  CT abdomen pelvis did not reveal any  abnormalities.  Unclear driving force for abdominal pain.  Patient reports a bandlike sensation around the upper abdomen.   7/3: Appears to be responding to therapy.  Greatly appreciate pulmonary and cardiac recommendations.  Abdominal pain improved.  No bowel movement reported yet.  All cultures no growth to date.  Reports better sleep after discontinuation of trazodone.   7/4: Poor sleep.  FOBT negative.  Afib remains difficult to control 7/5: Symptomatically improving.  Reports that daytime Ativan makes her too lethargic.  Seen by pulmonary consultant in follow-up.  Patient is symptomatically improving however has had similar improvements on previous hospitalizations and subsequent deterioration once independent at home.   Assessment and Plan: Active Problems:   CAP (community acquired pneumonia)   Bronchiectasis with acute exacerbation ([place])   Acute exacerbation of bronchiectasis Possible urinary tract infection Sepsis secondary to above Patient has advanced lung disease with frequent readmissions Most recently 2 weeks ago Patient is well-known to pulmonary consultant, recommendations appreciated Plan: Continue IV cefepime Follow blood cultures, no growth to date As needed mucolytic's Continue Xopenex every 4 hours as needed S/p IV steroids per pulmonary recommendations, switch to oral prednisone 40 mg daily from 7/7 Appreciate pulmonary recommendations   Atrial fibrillation with rapid ventricular response Likely triggered by underlying infection and lung disease Home regimen includes digoxin and Toprol-XL Cardizem CD was discontinued on previous admission Patient known to Dr. Ubaldo Glassing Initially responded to Cardizem bolus however became rapid again Started amiodarone gtt. on 7/1, digoxin and metoprolol held Transition to p.o. amiodarone 7/2 Occasional elevation heart rate - Patient with high anxiety.  Suspect this is worsening her afib Plan: DC daytime Ativan as patient states  makes her too lethargic Initiate low-dose p.o. Xanax for daytime  anxiety control Continue p.o. amiodarone 200 twice daily, can possibly increase to 400 twice daily if larger dose needed for rate control As needed Cardizem pushes for sustained tachycardia Eliquis for VTE prophylaxis   Essential hypertension Cozaar Toprol   GERD PPI   Anxiety Daytime Ativan discontinued Initiate Xanax 25 mg p.o. 3 times daily as needed   Functional decline Frequent readmissions The patient had expressed an interest in engaging hospice services due to decline of function and frequent readmissions.  She would like to avoid being hospitalized in the future.  I agree with her assessment.  We have engaged consultation from both hospice liaison as well as palliative care services. Plan: At time of this note patient would like to continue pushing forward with aggressive treatment Per previous pulmonary recommendation patient has had similar improvements on previous hospitalizations and subsequent deterioration upon return home.  If patient is not interested in hospice services at the moment strongly recommend outpatient palliative care services  Body mass index is 18.38 kg/m.  Nutrition Problem: Increased nutrient needs Etiology: acute illness (sepsis) Interventions: Interventions: Magic cup   Diet: Regular diet DVT Prophylaxis: Therapeutic Anticoagulation with Eliquis    Advance goals of care discussion: DNR  Family Communication: family was NOT present at bedside, at the time of interview.  The pt provided permission to discuss medical plan with the family. Opportunity was given to ask question and all questions were answered satisfactorily.   Disposition:  Pt is from Home, admitted with SOB, chronic MAC infection, still has MAC, which precludes a safe discharge. Discharge to Home with HH/PT, most likely tomorrow a.m.  Patient was supposed to go home with hospice care but she did not qualify for  hospice so she will be discharged home with home PT and palliative care will follow..  Subjective: No significant overnight events, patient still has shortness of breath, cough, does not feel much improvement.  Patient denies any chest pain or palpitations.  Denies any abdominal pain.  Patient feels extremely tired and does not think that she can make it to the home today.  We will plan to send her tomorrow a.m.  Physical Exam: General:  alert oriented to time, place, and person.  Appear in moderate distress, affect appropriate Eyes: PERRLA ENT: Oral Mucosa Clear, moist  Neck: no JVD,  Cardiovascular: S1 and S2 Present, no Murmur,  Respiratory: increased respiratory effort, Bilateral Air entry equal and Decreased, b/l Crackles, mild wheezes Abdomen: Bowel Sound present, Soft and no tenderness,  Skin: no rashes Extremities: no Pedal edema, no calf tenderness Neurologic: without any new focal findings Gait not checked due to patient safety concerns  Vitals:   08/05/20 0735 08/05/20 1132 08/05/20 1302 08/05/20 1712  BP: 111/81 124/82 132/81 (!) 150/92  Pulse: 74 77 90 69  Resp: _0 Temp: 97.6 F (36.4 C) 97.7 F (36.5 C) (!) 97.5 F (36.4 C) 97.7 F (36.5 C)  TempSrc: Oral Oral Oral Oral  SpO2: 94% 97% 100% 99%  Weight:      Height:        Intake/Output Summary (Last 24 hours) at 08/05/2020 1808 Last data filed at 08/05/2020 1716 Gross per 24 hour  Intake 806.58 ml  Output 2150 ml  Net -1343.42 ml   Filed Weights   07/30/20 2003 07/31/20 2200  Weight: 49.9 kg 50.1 kg    Data Reviewed: I have personally reviewed and interpreted daily labs, tele strips, imagings as discussed above. I reviewed all nursing notes,  pharmacy notes, vitals, pertinent old records I have discussed plan of care as described above with RN and patient/family.  CBC: Recent Labs  Lab 08/01/20 0521 08/02/20 0644 08/03/20 0607 08/04/20 0729 08/05/20 0548  WBC 12.8* 10.3 13.8* 17.2* 19.0*   NEUTROABS 11.4* 9.1* 12.0* 14.6* 16.1*  HGB 10.0* 9.6* 9.2* 10.3* 10.2*  HCT 27.6* 27.1* 26.7* 29.7* 29.1*  MCV 92.9 95.8 97.1 97.1 93.9  PLT 276 252 257 187 563   Basic Metabolic Panel: Recent Labs  Lab 08/01/20 0521 08/02/20 0644 08/03/20 0607 08/04/20 0729 08/05/20 0548  NA 131* 130* 130* 126* 126*  K 4.0 5.0 5.0 5.4* 4.8  CL 91* 91* 91* 85* 85*  CO2 32 33* 32 32 35*  GLUCOSE 149* 131* 124* 84 101*  BUN 9 12 25* 26* 30*  CREATININE 0.42* 0.47 0.55 0.59 0.60  CALCIUM 8.7* 8.8* 8.9 8.8* 8.8*    Studies: No results found.  Scheduled Meds:  acetaminophen  650 mg Oral QPM   acetylcysteine  2 mL Nebulization BID   acidophilus  1 capsule Oral Daily   amiodarone  200 mg Oral BID   apixaban  2.5 mg Oral BID   vitamin C  1,000 mg Oral Daily   digoxin  0.125 mg Oral Daily   gabapentin  300 mg Oral TID   guaiFENesin  600 mg Oral BID   levalbuterol  0.63 mg Nebulization BID   LORazepam  0.5 mg Oral QHS   metoprolol tartrate  25 mg Oral BID   pantoprazole (PROTONIX) IV  40 mg Intravenous QHS   polyethylene glycol  17 g Oral Daily   [START ON 08/06/2020] predniSONE  40 mg Oral Q breakfast   senna  2 tablet Oral QHS   Continuous Infusions:  sodium chloride 25 mL (08/03/20 1111)   ceFEPime (MAXIPIME) IV 2 g (08/05/20 0931)   PRN Meds: sodium chloride, acetaminophen **OR** acetaminophen, levalbuterol, magnesium hydroxide, oxybutynin, oxyCODONE  Time spent: 35 minutes  Author: Val Riles. MD [facility] 08/05/2020 6:08 PM  To reach On-call, see care teams to locate the attending and reach out to them via [email or web address]. If 7PM-7AM, please contact night-coverage If you still have difficulty reaching the attending provider, please page the [facility] (Director on Call) for [facility] on amion for assistance.

## 2020-08-05 NOTE — Care Management Important Message (Signed)
Important Message  Patient Details  Name: ALMIRA MATTEY MRN: [id number] Date of Birth: 1945-05-17   Medicare Important Message Given:  Other (see comment)  Per chart, plan to discharge home with hospice services.  Medicare IM withheld at this time.     Dannette Barbara 08/05/2020, 8:45 AM

## 2020-08-05 NOTE — Progress Notes (Signed)
Physical Therapy Treatment Patient Details Name: Ana Washington MRN: [id number] DOB: 1945-08-19 Today's Date: 08/05/2020    History of Present Illness Ana Washington is a 75 y.o. 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    PT Comments    Pt was sitting in recliner upon arriving. " I've been waiting on you all day." Pt is very agreeable to session and cooperative throughout. Was able to stand from recliner to RW and ambulate 60 ft.  WC follow for safety. Ambulated into hallway while adhering to isolation/ following precautions. Pt on 2 L o2 during ambulation with sao2 > 94%. HR in A-fib peaking at 135 BPM. Overall pt demonstrates improving activity tolerance and safety with gait. Recommend DC home with HHPT to follow once cleared medically. She was in bed at conclusion of session with call bell in reach and RN aware of pt's abilities.     Follow Up Recommendations  Home health PT;Supervision - Intermittent     Equipment Recommendations  None recommended by PT       Precautions / Restrictions Precautions Precautions: Fall Restrictions Weight Bearing Restrictions: No    Mobility  Bed Mobility Overal bed mobility: Modified Independent Bed Mobility: Sit to Supine       Sit to supine: Min guard   General bed mobility comments: Was able to get back into bed without physical assistance.    Transfers Overall transfer level: Needs assistance Equipment used: Rolling walker (2 wheeled) Transfers: Sit to/from Stand Sit to Stand: Supervision         General transfer comment: pt performed STS from recliner and w/c without assistance. did requrie vcs for  handplacement, fwd wt shift, and overall improved safety awareness  Ambulation/Gait Ambulation/Gait assistance: Supervision Gait Distance (Feet): 60 Feet Assistive device: Rolling walker (2 wheeled) Gait Pattern/deviations: Step-through pattern Gait velocity: WFL   General Gait Details: Pt was able to ambulate 2 x 60 ft with w/c follow for safety. HR maintained less than 135bpm with sao2 >95% on 2 L during ambulation     Balance Overall balance assessment: Needs assistance Sitting-balance support: Feet supported Sitting balance-Leahy Scale: Good     Standing balance support: Bilateral upper extremity supported;During functional activity Standing balance-Leahy Scale: Good Standing balance comment: no LOB with use of RW        Cognition Arousal/Alertness: Awake/alert Behavior During Therapy: WFL for tasks assessed/performed Overall Cognitive Status: Within Functional Limits for tasks assessed        General Comments: [name] x 4             Pertinent Vitals/Pain Pain Assessment: No/denies pain     PT Goals (current goals can now be found in the care plan section) Acute Rehab PT Goals Patient Stated Goal: go home Progress towards PT goals: Progressing toward goals    Frequency    Min 2X/week      PT Plan Current plan remains appropriate       AM-PAC PT "6 Clicks" Mobility   Outcome Measure  Help needed turning from your back to your side while in a flat bed without using bedrails?: None Help needed moving from lying on your back to sitting on the side  of a flat bed without using bedrails?: A Little Help needed moving to and from a bed to a chair (including a wheelchair)?: A Little Help needed standing up from a chair using your arms (e.g., wheelchair or bedside chair)?: A Little Help needed to walk in hospital room?: A Little Help needed climbing 3-5 steps with a railing? : A Little 6 Click Score: 19    End of Session Equipment Utilized During Treatment:  Gait belt;Oxygen Activity Tolerance: Patient tolerated treatment well Patient left: in bed;with call bell/phone within reach;with bed alarm set;with family/visitor present Nurse Communication: Mobility status PT Visit Diagnosis: Muscle weakness (generalized) (M62.81);Difficulty in walking, not elsewhere classified (R26.2)     Time: 7989-2119 PT Time Calculation (min) (ACUTE ONLY): 33 min  Charges:  $Gait Training: 8-22 mins $Therapeutic Activity: 8-22 mins                     Julaine Fusi PTA 08/05/20, 4:21 PM

## 2020-08-05 NOTE — TOC Initial Note (Addendum)
Transition of Care [facility]) - Initial/Assessment Note    Patient Details  Name: Ana Washington MRN: [id number] Date of Birth: [birth year]  Transition of Care [facility]) CM/SW Contact:    Alberteen Sam, LCSW Phone Number: 08/05/2020, 3:12 PM  Clinical Narrative:                  CSW spoke with patient regarding recommendation of home health services. Patient reports she is in agreement with this and would prefer to resume her current services with [facility].   CSW spoke with [place] with [facility] who reports patient was active with PT and OT services.   Patient will need home health PT and OT orders at time of discharge.   Patient will [facility] with [facility] for outpatient palliative to follow as well.   No other needs identified at this time.   Expected Discharge Plan: [place] Barriers to Discharge: Continued Medical Work up   Patient Goals and CMS Choice Patient states their goals for this hospitalization and ongoing recovery are:: to go home CMS Medicare.gov Compare Post Acute Care list provided to:: Patient Choice offered to / list presented to : Patient  Expected Discharge Plan and Services Expected Discharge Plan: [place] Arranged: PT, OT [facility] Agency: [place] Date [facility] Agency Contacted: 08/05/20 Time HH Agency Contacted: 12 Representative spoke with at [place]: [place]  Prior Living Arrangements/Services     Patient language and need for interpreter reviewed:: Yes Do you feel safe going back to the place where you live?: Yes      Need for Family Participation in Patient Care: Yes (Comment) Care giver support system in place?: Yes (comment)   Criminal Activity/Legal Involvement Pertinent to Current Situation/Hospitalization: No - Comment as needed  Activities of Daily Living Home Assistive Devices/Equipment: Shower chair with back, Environmental consultant (specify type) ADL  Screening (condition at time of admission) Patient's cognitive ability adequate to safely complete daily activities?: Yes Is the patient deaf or have difficulty hearing?: No Does the patient have difficulty seeing, even when wearing glasses/contacts?: No Does the patient have difficulty concentrating, remembering, or making decisions?: No Patient able to express need for assistance with ADLs?: Yes Does the patient have difficulty dressing or bathing?: No Independently performs ADLs?: Yes (appropriate for developmental age) Does the patient have difficulty walking or climbing stairs?: Yes Weakness of Legs: None Weakness of Arms/Hands: None  Permission Sought/Granted Permission sought to share information with : Case Manager, Customer service manager, Family Supports Permission granted to share information with : Yes, Verbal Permission Granted     Permission granted to share info w AGENCY: [facility]        Emotional Assessment   Attitude/Demeanor/Rapport: Gracious Affect (typically observed): Calm Orientation: : Oriented to Self, Oriented to Place, Oriented to  Time, Oriented to Situation Alcohol / Substance Use: Not Applicable Psych Involvement: No (comment)  Admission diagnosis:  Bronchiectasis with acute exacerbation ([place]) [J47.1] CAP (community acquired pneumonia) [J18.9] Protein-calorie malnutrition, severe ([place]) [E43] Chronic respiratory failure with hypoxia ([place]) [J96.11] HCAP (healthcare-associated pneumonia) [J18.9] Sepsis ([place]) [A41.9] Atrial fibrillation, unspecified type ([place]) [I48.91] Sepsis, due to unspecified organism, unspecified whether acute organ dysfunction present [facility]) [A41.9] Patient Active Problem List   Diagnosis Date Noted   Bronchiectasis with acute  exacerbation ([place]) 07/31/2020   CAP (community acquired pneumonia) 07/30/2020   Acute exacerbation of bronchiectasis ([place]) 07/14/2020   COVID-19 virus infection 07/14/2020   Lactic acidosis  07/14/2020   SIRS (systemic inflammatory response syndrome) ([place]) 07/14/2020   Elevated troponin level not due myocardial infarction 07/14/2020   Anxiety    Constipation    Pressure injury of right heel, stage 1    Bronchiectasis with (acute) exacerbation ([place]) 04/29/2020   Hemoptysis    Chronic respiratory failure with hypoxia ([place])    AKI (acute kidney injury) ([place])    Bronchiectasis ([place]) 09/08/2019   Atrial fibrillation, chronic ([place]) 09/08/2019   Closed right hip fracture (HCC) 09/27/2018   Intractable nausea and vomiting 04/08/2018   Atrial fibrillation with RVR ([place]) 04/08/2018   Thoracic radiculitis (Bilateral) 03/29/2018   Vasovagal episode 03/29/2018   Closed compression fracture of T10 thoracic vertebra, sequela 03/29/2018   Abnormal MRI, lumbar spine (12/15/2016) 03/21/2018   Lumbar compression fractures, sequela (L1, L2, L3, L4, and L5) 03/21/2018   Closed compression fracture of L1 lumbar vertebra, sequela 03/21/2018   Closed compression fracture of L2 lumbar vertebra, sequela 03/21/2018   Closed compression fracture of L3 lumbar vertebra, sequela 03/21/2018   Closed compression fracture of L4 lumbar vertebra, sequela 03/21/2018   Closed compression fracture of L5 lumbar vertebra, sequela 03/21/2018   Thoracic compression fracture, sequela (T5, T9, T10, T11, and T12) 03/21/2018   Closed compression fracture of T5 thoracic vertebra, sequela 03/21/2018   Closed compression fracture of T9 thoracic vertebra, sequela 03/21/2018   Close compression fracture of T11 thoracic vertebra, sequela 03/21/2018   Closed compression fracture of T12 thoracic vertebra, sequela 03/21/2018   Lumbar facet hypertrophy 03/21/2018   Grade 1  Lumbar Anterolisthesis of L3/4 and L4/5 03/21/2018   Lumbar central spinal stenosis (Multilevel), 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 claudication 03/21/2018   Chronic anticoagulation (ELIQUIS) 03/21/2018   History of pelvic fracture 03/21/2018   Chronic musculoskeletal pain  03/21/2018   Neurogenic pain 03/21/2018   Long term prescription benzodiazepine use 03/21/2018   DDD (degenerative disc disease), 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   Adult bronchiectasis ([place]) 03/01/2018   Diverticulitis 03/01/2018   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[place]) 03/01/2018   Migraines 03/01/2018   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   Osteoporosis, 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   Psoriasis 03/01/2018   Chronic upper back pain (Primary Area of Pain) (Bilateral) (R>L) 03/01/2018   Chronic low back pain (Secondary Area of Pain) (Bilateral) (R>L) w/o sciatica 03/01/2018   Chronic pain syndrome 03/01/2018   Long term current use of opiate analgesic 03/01/2018   Pharmacologic therapy 03/01/2018   Disorder of skeletal system 03/01/2018   Problems influencing health status 03/01/2018   History of kyphoplasty (L1, L2, L3, T9, T11, and T12) 01/05/2018   Malnutrition of moderate degree ([place]) 08/03/2017   GERD (gastroesophageal reflux disease) 07/30/2017   Pelvic fracture ([place]) 07/30/2017   AF (paroxysmal atrial fibrillation) ([place]) 07/30/2017   Other dysphagia 09/13/2016   Unintended weight loss 09/13/2016   Essential hypertension 10/03/2013   Osteoarthritis of knees (Bilateral) 10/03/2013   Primary localized osteoarthrosis, lower leg 10/03/2013   Non-ischemic cardiomyopathy ([place]) 09/12/2013   Coronary artery disease involving native coronary artery of native heart without angina pectoris 07/28/2012   Hyperlipidemia, mixed 07/28/2012   DDD (degenerative disc disease), lumbar 02/29/2012   PCP:  [name], MD Pharmacy:   [id number] - [place], [address] [place] [address]  Phone: [phone] Fax: [phone]     Social Determinants of Health (SDOH) Interventions    Readmission Risk Interventions Readmission Risk Prevention Plan 07/17/2020 01/06/2018  Transportation  Screening Complete Complete  PCP or Specialist Appt within 5-7 Days - Complete  PCP or Specialist Appt within 3-5 Days Complete -  Home Care Screening - Complete  Medication Review (RN CM) - Complete  HRI or Home Care Consult Complete -  Social Work Consult for [place] Planning/Counseling Complete -  Palliative Care Screening Not Applicable -  Medication Review Press photographer) Complete -  Some recent data might be hidden

## 2020-08-05 NOTE — Progress Notes (Signed)
Daily Progress Note   Patient Name: Ana Washington       Date: 08/05/2020 DOB: July 20, 1945  Age: 75 y.o. MRN#: [id number] Attending Physician: Val Riles, MD Primary Care Physician: [name], MD Admit Date: 07/30/2020  Reason for Consultation/Follow-up: Establishing goals of care  Subjective: C/o feeling tired, weak, and anxious today.   Length of Stay: 6  Current Medications: Scheduled Meds:  . acetaminophen  650 mg Oral QPM  . acetylcysteine  2 mL Nebulization BID  . acidophilus  1 capsule Oral Daily  . amiodarone  200 mg Oral BID  . apixaban  2.5 mg Oral BID  . vitamin C  1,000 mg Oral Daily  . digoxin  0.125 mg Oral Daily  . gabapentin  300 mg Oral TID  . guaiFENesin  600 mg Oral BID  . levalbuterol  0.63 mg Nebulization BID  . LORazepam  0.5 mg Oral QHS  . metoprolol tartrate  12.5 mg Oral BID  . polyethylene glycol  17 g Oral Daily  . predniSONE  50 mg Oral Q breakfast  . senna  2 tablet Oral QHS    Continuous Infusions: . sodium chloride 25 mL (08/03/20 1111)  . ceFEPime (MAXIPIME) IV 2 g (08/05/20 0931)    PRN Meds: sodium chloride, acetaminophen **OR** acetaminophen, ALPRAZolam, levalbuterol, magnesium hydroxide, oxybutynin, oxyCODONE  Physical Exam Constitutional:      General: She is not in acute distress. Pulmonary:     Effort: Pulmonary effort is normal.  Skin:    General: Skin is warm and dry.  Neurological:     Mental Status: She is alert and oriented to person, place, and time.  Psychiatric:        Behavior: Behavior is slowed.        Cognition and Memory: Cognition and memory normal.            Vital Signs: BP 132/81 (BP Location: Left Arm)   Pulse 90   Temp (!) 97.5 F (36.4 C) (Oral)   Resp 18   Ht _0  (1.651 m)   Wt 50.1 kg   SpO2 100%    BMI 18.38 kg/m  SpO2: SpO2: 100 % O2 Device: O2 Device: Nasal Cannula O2 Flow Rate: O2 Flow Rate (L/min): 2 L/min  Intake/output summary:  Intake/Output Summary (Last 24 hours) at 08/05/2020 1440 Last data filed at 08/05/2020 1303 Gross per 24 hour  Intake 480 ml  Output 2200 ml  Net -1720 ml   LBM: Last BM Date: 08/04/20 Baseline Weight: Weight: 49.9 kg Most recent weight: Weight: 50.1 kg       Palliative Assessment/Data: PPS 50%    Flowsheet Rows    Flowsheet Row Most Recent Value  Intake Tab   Referral Department Pulmonary  Unit at Time of Referral Intermediate Care Unit  Palliative Care Primary Diagnosis Pulmonary  Date Notified 07/31/20  Palliative Care Type New Palliative care  Reason for referral Clarify Goals of Care  Date of Admission 07/30/20  Date first seen by Palliative Care 08/03/20  # of days Palliative referral response time 3 Day(s)  # of days IP prior to Palliative referral 1  Clinical Assessment   Psychosocial & Spiritual Assessment  Palliative Care Outcomes        Patient Active Problem List   Diagnosis Date Noted  . Bronchiectasis with acute exacerbation ([place]) 07/31/2020  . CAP (community acquired pneumonia) 07/30/2020  . Acute exacerbation of bronchiectasis ([place]) 07/14/2020  . COVID-19 virus infection 07/14/2020  . Lactic acidosis 07/14/2020  . SIRS (systemic inflammatory response syndrome) ([place]) 07/14/2020  . Elevated troponin level not due myocardial infarction 07/14/2020  . Anxiety   . Constipation   . Pressure injury of right heel, stage 1   . Bronchiectasis with (acute) exacerbation ([place]) 04/29/2020  . Hemoptysis   . Chronic respiratory failure with hypoxia ([place])   . AKI (acute kidney injury) ([place])   . Bronchiectasis ([place]) 09/08/2019  . Atrial fibrillation, chronic ([place]) 09/08/2019  . Closed right hip fracture ([place]) 09/27/2018  . Intractable nausea and vomiting 04/08/2018  . Atrial fibrillation with RVR ([place]) 04/08/2018  .  Thoracic radiculitis (Bilateral) 03/29/2018  . Vasovagal episode 03/29/2018  . Closed compression fracture of T10 thoracic vertebra, sequela 03/29/2018  . Abnormal MRI, lumbar spine (12/15/2016) 03/21/2018  . Lumbar compression fractures, sequela (L1, L2, L3, L4, and L5) 03/21/2018  . Closed compression fracture of L1 lumbar vertebra, sequela 03/21/2018  . Closed compression fracture of L2 lumbar vertebra, sequela 03/21/2018  . Closed compression fracture of L3 lumbar vertebra, sequela 03/21/2018  . Closed compression fracture of L4 lumbar vertebra, sequela 03/21/2018  . Closed compression fracture of L5 lumbar vertebra, sequela 03/21/2018  . Thoracic compression fracture, sequela (T5, T9, T10, T11, and T12) 03/21/2018  . Closed compression fracture of T5 thoracic vertebra, sequela 03/21/2018  . Closed compression fracture of T9 thoracic vertebra, sequela 03/21/2018  . Close compression fracture of T11 thoracic vertebra, sequela 03/21/2018  . Closed compression fracture of T12 thoracic vertebra, sequela 03/21/2018  . Lumbar facet hypertrophy 03/21/2018  . Grade 1  Lumbar Anterolisthesis of L3/4 and L4/5 03/21/2018  . Lumbar central spinal stenosis (Multilevel), 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 claudication 03/21/2018  . Chronic anticoagulation (ELIQUIS) 03/21/2018  . History of pelvic fracture 03/21/2018  . Chronic musculoskeletal pain 03/21/2018  . Neurogenic pain 03/21/2018  . Long term prescription benzodiazepine use 03/21/2018  . 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 Adult bronchiectasis ([place]) 03/01/2018  . Diverticulitis 03/01/2018  . 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[place]) 03/01/2018  . Migraines 03/01/2018  . 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 Psoriasis 03/01/2018  . Chronic upper back pain (Primary Area of Pain) (Bilateral) (R>L) 03/01/2018  . Chronic low back pain (Secondary Area of Pain) (Bilateral) (R>L) w/o sciatica  03/01/2018  . Chronic pain syndrome 03/01/2018  . Long term current use of opiate analgesic 03/01/2018  . Pharmacologic therapy 03/01/2018  . Disorder of skeletal system 03/01/2018  . Problems influencing health status 03/01/2018  . History of kyphoplasty (L1, L2, L3, T9, T11, and T12) 01/05/2018  . Malnutrition of moderate degree ([place]) 08/03/2017  . GERD (gastroesophageal reflux disease) 07/30/2017  . Pelvic fracture ([place]) 07/30/2017  . AF (paroxysmal atrial fibrillation) ([place]) 07/30/2017  . Other dysphagia 09/13/2016  . Unintended weight loss 09/13/2016  . Essential hypertension 10/03/2013  . Osteoarthritis of knees (Bilateral) 10/03/2013  . Primary localized osteoarthrosis, lower leg 10/03/2013  . Non-ischemic cardiomyopathy ([place]) 09/12/2013  . Coronary artery disease involving native coronary artery of native heart without angina pectoris 07/28/2012  . Hyperlipidemia, mixed 07/28/2012  . DDD (degenerative disc disease), lumbar 02/29/2012    Palliative Care Assessment & Plan   HPI: 75 y.o.  female  with past medical history of CAD, htn, GERD, bronchiectasis, chronic MAC, chronic resp failure on 2 L oxygen at home, takotsubo cardiomyopathy, a fib, anxiety, CHF, recent COVID-19 infection admitted on 07/30/2020 with persistent cough, worsening dyspnea, and weakness. Found to be in rapid a fib on admission - somewhat better controlled but occasionally becomes tachycardic and symptomatic. Patient also being treated for acute exacerbation of bronchiectasis and possible UTI. Patient with persistent anxiety. PMT consulted by Dr. Lanney Gins to discuss goals of care.   Assessment: F/u today with Ana Washington. She remembers are conversation from 2 days ago well and we review that discussion.  We discuss yesterday she decided to go home with hospice however she does not yet meet hospice criteria. We review the reasons she came to this decision - she shares that she is no longer interested in  aggressive medical care such as hospital admissions and wants to focus on comfort and quality of life.  She also shares that wants to die at home and wants to only be at home. She is not interested in rehab facility placement.    I completed a MOST form today. The patient and family outlined their wishes for the following treatment decisions:  Cardiopulmonary Resuscitation: Do Not Attempt Resuscitation (DNR/No CPR)  Medical Interventions: Comfort Measures: Keep clean, warm, and dry. Use medication by any route, positioning, wound care, and other measures to relieve pain and suffering. Use oxygen, suction and manual treatment of airway obstruction as needed for comfort. Do not transfer to the hospital unless comfort needs cannot be met in current location.  Antibiotics: Determine use of limitation of antibiotics when infection occurs  IV Fluids: No IV fluids (provide other measures to ensure comfort)  Feeding Tube: No feeding tube   We again reviewed that she is no longer interested in further hospital admissions.  She tells me her family is supportive of these decisions.   We discuss referral to outpatient palliative care and their help with hospice referral when appropriate. She agrees.   We review her anxiety - she feels like the medications are sedating her. She tells me she is not this anxious at home and feels anxiety is r/t hospital admission. She is hopeful for dc tomorrow.   Recommendations/Plan: Home with home health and palliative follow up MOST completed as above - no further hospital admission, involve hospice when she declines  Goals of Care and Additional Recommendations: Limitations on Scope of Treatment: Avoid Hospitalization, No Artificial Feeding, and No IV Fluids  Code Status: DNR  Prognosis:  Unable to determine  Discharge Planning: Home with [place] was discussed with patient  Thank you for allowing the Palliative Medicine  Team to assist in the care of this patient.   Total Time 30 minutes Prolonged Time Billed  no       Greater than 50%  of this time was spent counseling and coordinating care related to the above assessment and plan.  Juel Burrow, DNP, [facility] Palliative Medicine Team Team Phone # [phone]  Pager [phone]

## 2020-08-06 DIAGNOSIS — J471 Bronchiectasis with (acute) exacerbation: Secondary | ICD-10-CM | POA: Diagnosis not present

## 2020-08-06 DIAGNOSIS — J189 Pneumonia, unspecified organism: Secondary | ICD-10-CM | POA: Diagnosis not present

## 2020-08-06 LAB — CBC WITH DIFFERENTIAL/PLATELET
Abs Immature Granulocytes: 0.59 10*3/uL — ABNORMAL HIGH (ref 0.00–0.07)
Basophils Absolute: 0.1 10*3/uL (ref 0.0–0.1)
Basophils Relative: 0 %
Eosinophils Absolute: 0 10*3/uL (ref 0.0–0.5)
Eosinophils Relative: 0 %
HCT: 30.8 % — ABNORMAL LOW (ref 36.0–46.0)
Hemoglobin: 10.9 g/dL — ABNORMAL LOW (ref 12.0–15.0)
Immature Granulocytes: 3 %
Lymphocytes Relative: 8 %
Lymphs Abs: 1.5 10*3/uL (ref 0.7–4.0)
MCH: 34.4 pg — ABNORMAL HIGH (ref 26.0–34.0)
MCHC: 35.4 g/dL (ref 30.0–36.0)
MCV: 97.2 fL (ref 80.0–100.0)
Monocytes Absolute: 1.4 10*3/uL — ABNORMAL HIGH (ref 0.1–1.0)
Monocytes Relative: 7 %
Neutro Abs: 15.9 10*3/uL — ABNORMAL HIGH (ref 1.7–7.7)
Neutrophils Relative %: 82 %
Platelets: 299 10*3/uL (ref 150–400)
RBC: 3.17 MIL/uL — ABNORMAL LOW (ref 3.87–5.11)
RDW: 12.5 % (ref 11.5–15.5)
WBC: 19.5 10*3/uL — ABNORMAL HIGH (ref 4.0–10.5)
nRBC: 0.2 % (ref 0.0–0.2)

## 2020-08-06 NOTE — Progress Notes (Signed)
Occupational Therapy Treatment Patient Details Name: Ana Washington MRN: [id number] DOB: 1945-02-03 Today's Date: 08/06/2020    History of present illness Ana Washington is a 75 y.o. 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   OT comments  Pt seen for OT tx this date to f/u re: safety with ADLs/ADL mobility. Pt requesting to walk this session. OT engages pt in STS x3 before leaving the room to complete self care tasks including changing underwear and pad and peri care. Pt requires MOD A for these tasks as well as MIN A for STS with RW for balance. Pt requires MIN A/CGA for fxl mobility on unit with HR up to 117 with fxl mobility, O2 stable on 2Lnc throughout. Pt with good normal resting HR when sitting in the 80s. Pt completes ~70' on the unit with chair follow d/t significant deconditioning and requires ~5 minute seated rest break at half way point. Pt requires MOD A for peri care after small BM toward end of session. She is returned to bed with CGA and left with all needs met and in reach. Will continue to follow acutely.    Follow Up Recommendations  Home health OT;Supervision - Intermittent    Equipment Recommendations  None recommended by OT (pt has all equipment)    Recommendations for Other Services      Precautions / Restrictions Precautions Precautions: Fall Precaution Comments: mod fall Restrictions Weight Bearing Restrictions: No       Mobility Bed Mobility   Bed Mobility: Sit to Supine       Sit to supine: Min guard   General bed mobility comments: increased time to get LEs back to bed    Transfers Overall transfer level: Needs  assistance Equipment used: Rolling walker (2 wheeled) Transfers: Sit to/from [facility] Sit to Stand: Min assist         General transfer comment: increased time to CTS, MIN A initially, does eventually improve to CGA for last trial of session out of ~4 trials considering toileting and dressing tasks completed    Balance Overall balance assessment: Needs assistance Sitting-balance support: Feet supported Sitting balance-Leahy Scale: Good     Standing balance support: Bilateral upper extremity supported;During functional activity Standing balance-Leahy Scale: Fair Standing balance comment: requires UE support                           ADL either performed or assessed with clinical judgement   ADL Overall ADL's : Needs assistance/impaired     Grooming: Wash/dry hands;Set up;Sitting       Lower Body Bathing: Minimal assistance;Moderate assistance;Sit to/from stand Lower Body Bathing Details (indicate cue type and reason): with RW for standing balance Upper Body Dressing : Set up;Sitting Upper Body Dressing Details (indicate cue type and reason): to don gown Lower Body Dressing: Moderate assistance;Sit to/from stand Lower Body Dressing Details (indicate cue type and reason): to thread mesh underwear, pt able to pull over hips in standing with MIN A Toilet Transfer: Min guard;Minimal assistance Toilet Transfer Details (indicate cue type and reason): to/from [facility] Toileting- Clothing Manipulation and Hygiene: Minimal assistance;Moderate assistance Toileting - Clothing Manipulation Details (indicate  cue type and reason): for peri care after BM     Functional mobility during ADLs: Rolling walker;Min guard;Minimal assistance (with O2 tank and chair follow d/t deconditioning, pt completes ~70' on unit with on seated rest break x5 mins)       Vision       Perception     Praxis      Cognition Arousal/Alertness: Awake/alert Behavior During Therapy:  WFL for tasks assessed/performed Overall Cognitive Status: Within Functional Limits for tasks assessed                                          Exercises Other Exercises Other Exercises: OT engaged pt in toileting, dressing, and light bathing tasks as well as fxl mobility wiht RW and consistent O2/HR checks while on 2Lnc to complete ~70' on unit with on seated rest break   Shoulder Instructions       General Comments      Pertinent Vitals/ Pain       Pain Assessment: Faces Faces Pain Scale: Hurts little more Pain Location: discomfort 2/2 not being able to adjust seating while up in chair herself such as lever to recline Pain Descriptors / Indicators: Sore Pain Intervention(s): Repositioned  Home Living                                          Prior Functioning/Environment              Frequency  Min 2X/week        Progress Toward Goals  OT Goals(current goals can now be found in the care plan section)  Progress towards OT goals: Progressing toward goals  Acute Rehab OT Goals Patient Stated Goal: go home OT Goal Formulation: With patient Time For Goal Achievement: 08/14/20 Potential to Achieve Goals: Good  Plan Discharge plan needs to be updated    Co-evaluation                 AM-PAC OT "6 Clicks" Daily Activity     Outcome Measure   Help from another person eating meals?: None Help from another person taking care of personal grooming?: A Little Help from another person toileting, which includes using toliet, bedpan, or urinal?: A Little Help from another person bathing (including washing, rinsing, drying)?: A Little Help from another person to put on and taking off regular upper body clothing?: A Little Help from another person to put on and taking off regular lower body clothing?: A Lot 6 Click Score: 18    End of Session Equipment Utilized During Treatment: Rolling walker;Oxygen  OT Visit Diagnosis:  Unsteadiness on feet (R26.81);Muscle weakness (generalized) (M62.81)   Activity Tolerance Patient tolerated treatment well;Other (comment);Patient limited by fatigue   Patient Left with call bell/phone within reach;in bed;with bed alarm set;Other (comment) (pure wick on)   Nurse Communication Mobility status        Time: 1512-1600 OT Time Calculation (min): 48 min  Charges: OT General Charges $OT Visit: 1 Visit OT Treatments $Self Care/Home Management : 23-37 mins $Therapeutic Activity: 8-22 mins  Gerrianne Scale, MS, OTR/L ascom [phone] 08/06/20, 6:44 PM

## 2020-08-06 NOTE — Progress Notes (Signed)
[facility] Progress Note  Patient: Ana Washington    WIO:[id number]  DOA: 07/30/2020     Date of Service: the patient was seen and examined on 08/06/2020  Chief Complaint  Patient presents with   Shortness of Breath   Brief hospital course: 75 y.o. Caucasian female with medical history significant for coronary artery disease, essential hypertension, GERD, as well as history of bronchiectasis and chronic MAC, , chronic respiratory failure (on 2lpm via [place] at home), Takotsubo cardiomyopathy (2015 at [facility]), gastroesophageal reflux disease, hypertension, paroxysmal atrial fibrillation (on Eliquis), generalized anxiety disorder, diastolic congestive heart failure (Echo 03/2020 EF 50-55% with G1DD), with recent COVID 19 infection 16 days ago, who presented to the emergency room with acute onset of persistent cough since then with expectoration of yellowish green sputum and worsening dyspnea today as well as wheezing.  She has been feeling "very very weak".  She was admitted here recently for exacerbation of her bronchiectasis from 6/14-6/20.  She was discharged home on p.o. Levaquin and prednisone.  She finished a course of IV remdesivir when she was here   The patient is well-known to both Dr. Ubaldo Glassing and Dr. Lanney Gins.  I appreciate consultations from both of them.  Clinical presentation is likely mixed combination of exacerbation of bronchiectasis, partial contribution from suspected urinary tract infection and rapid atrial fibrillation.   Patient was in rapid atrial fibrillation on admission.  Initially controlled however occasionally becomes tachycardic and acutely symptomatic.  Responded to IV Cardizem however rate became uncontrolled shortly thereafter.  After pulmonary evaluation was started on amiodarone infusion.   Patient appears to be responding to initial therapy.  Heart rate improved on amiodarone infusion.  Patient was endorsing abdominal pain.  CT abdomen pelvis did not reveal any  abnormalities.  Unclear driving force for abdominal pain.  Patient reports a bandlike sensation around the upper abdomen.   7/3: Appears to be responding to therapy.  Greatly appreciate pulmonary and cardiac recommendations.  Abdominal pain improved.  No bowel movement reported yet.  All cultures no growth to date.  Reports better sleep after discontinuation of trazodone.   7/4: Poor sleep.  FOBT negative.  Afib remains difficult to control 7/5: Symptomatically improving.  Reports that daytime Ativan makes her too lethargic.  Seen by pulmonary consultant in follow-up.  Patient is symptomatically improving however has had similar improvements on previous hospitalizations and subsequent deterioration once independent at home.   Assessment and Plan: Active Problems:   CAP (community acquired pneumonia)   Bronchiectasis with acute exacerbation ([place])   Acute exacerbation of bronchiectasis Possible urinary tract infection Sepsis secondary to above Patient has advanced lung disease with frequent readmissions Most recently 2 weeks ago Patient is well-known to pulmonary consultant, recommendations appreciated Plan: S/p IV cefepime d/c'd on 7/7 Follow blood cultures, no growth to date As needed mucolytic's Continue Xopenex every 4 hours as needed S/p IV steroids per pulmonary recommendations, switch to oral prednisone 40 mg daily from 7/7, will start quick taper over few days Appreciate pulmonary recommendations   Atrial fibrillation with rapid ventricular response Likely triggered by underlying infection and lung disease Home regimen includes digoxin and Toprol-XL Cardizem CD was discontinued on previous admission Patient known to Dr. Ubaldo Glassing Initially responded to Cardizem bolus however became rapid again Started amiodarone gtt. on 7/1, digoxin and metoprolol held Transition to p.o. amiodarone 7/2 Occasional elevation heart rate - Patient with high anxiety.  Suspect this is worsening her  afib Plan: Resumed Ativan 0.5 mg nightly, patient  did not like Xanax which was discontinued Continue p.o. amiodarone 200 twice daily, Continue digoxin _0 mcg p.o. daily, Lopressor 25 mg p.o. twice daily as needed Cardizem pushes for sustained tachycardia Eliquis for VTE prophylaxis   Essential hypertension Continue metoprolol 25 mg p.o. twice daily   GERD PPI   Anxiety Resumed Ativan 0.5 mg nightly, patient did not like Xanax which was discontinued    Functional decline Frequent readmissions The patient had expressed an interest in engaging hospice services due to decline of function and frequent readmissions.  She would like to avoid being hospitalized in the future.  I agree with her assessment.  We have engaged consultation from both hospice liaison as well as palliative care services. Plan: At time of this note patient would like to continue pushing forward with aggressive treatment Per previous pulmonary recommendation patient has had similar improvements on previous hospitalizations and subsequent deterioration upon return home.  If patient is not interested in hospice services at the moment strongly recommend outpatient palliative care services  Body mass index is 18.38 kg/m.  Nutrition Problem: Increased nutrient needs Etiology: acute illness (sepsis) Interventions: Interventions: Magic cup   Diet: Regular diet DVT Prophylaxis: Therapeutic Anticoagulation with Eliquis    Advance goals of care discussion: DNR  Family Communication: family was NOT present at bedside, at the time of interview.  The pt provided permission to discuss medical plan with the family. Opportunity was given to ask question and all questions were answered satisfactorily.   Disposition:  Pt is from Home, admitted with SOB, chronic MAC infection, still has MAC, which precludes a safe discharge. Discharge to Home with HH/PT, most likely tomorrow a.m.  Patient was supposed to go home with  hospice care but she did not qualify for hospice so she will be discharged home with home PT and palliative care will follow..  Subjective: No significant overnight events, patient feels weakness and not back to her baseline activity, but patient is persistently refusing to go to SNF.  Patient wants to go home. Patient has debility, needs home care.  Today patient is feeling so weak so we will observe her overnight, plan for disposition tomorrow a.m.    Physical Exam: General:  alert oriented to time, place, and person.  Appear in moderate distress, affect appropriate Eyes: PERRLA ENT: Oral Mucosa Clear, moist  Neck: no JVD,  Cardiovascular: S1 and S2 Present, no Murmur,  Respiratory: increased respiratory effort, Bilateral Air entry equal and Decreased, b/l Crackles, mild wheezes Abdomen: Bowel Sound present, Soft and no tenderness,  Skin: no rashes Extremities: no Pedal edema, no calf tenderness Neurologic: without any new focal findings Gait not checked due to patient safety concerns  Vitals:   08/06/20 0533 08/06/20 0800 08/06/20 0808 08/06/20 1133  BP: 137/88 (!) 135/91  124/85  Pulse: 85 63  85  Resp: _1 Temp: 97.6 F (36.4 C) 97.7 F (36.5 C)  97.8 F (36.6 C)  TempSrc: Oral Oral    SpO2: 100% 100% 100% 100%  Weight:      Height:        Intake/Output Summary (Last 24 hours) at 08/06/2020 1548 Last data filed at 08/06/2020 1130 Gross per 24 hour  Intake 806.58 ml  Output 800 ml  Net 6.58 ml   Filed Weights   07/30/20 2003 07/31/20 2200  Weight: 49.9 kg 50.1 kg    Data Reviewed: I have personally reviewed and interpreted daily labs, tele strips, imagings as discussed  above. I reviewed all nursing notes, pharmacy notes, vitals, pertinent old records I have discussed plan of care as described above with RN and patient/family.  CBC: Recent Labs  Lab 08/02/20 0644 08/03/20 0607 08/04/20 0729 08/05/20 0548 08/06/20 0722  WBC 10.3 13.8* 17.2* 19.0*  19.5*  NEUTROABS 9.1* 12.0* 14.6* 16.1* 15.9*  HGB 9.6* 9.2* 10.3* 10.2* 10.9*  HCT 27.1* 26.7* 29.7* 29.1* 30.8*  MCV 95.8 97.1 97.1 93.9 97.2  PLT 252 257 187 283 124   Basic Metabolic Panel: Recent Labs  Lab 08/01/20 0521 08/02/20 0644 08/03/20 0607 08/04/20 0729 08/05/20 0548  NA 131* 130* 130* 126* 126*  K 4.0 5.0 5.0 5.4* 4.8  CL 91* 91* 91* 85* 85*  CO2 32 33* 32 32 35*  GLUCOSE 149* 131* 124* 84 101*  BUN 9 12 25* 26* 30*  CREATININE 0.42* 0.47 0.55 0.59 0.60  CALCIUM 8.7* 8.8* 8.9 8.8* 8.8*    Studies: No results found.  Scheduled Meds:  acetaminophen  650 mg Oral QPM   acetylcysteine  2 mL Nebulization BID   acidophilus  1 capsule Oral Daily   amiodarone  200 mg Oral BID   apixaban  2.5 mg Oral BID   vitamin C  1,000 mg Oral Daily   digoxin  0.125 mg Oral Daily   gabapentin  300 mg Oral TID   guaiFENesin  600 mg Oral BID   levalbuterol  0.63 mg Nebulization BID   LORazepam  0.5 mg Oral QHS   metoprolol tartrate  25 mg Oral BID   pantoprazole (PROTONIX) IV  40 mg Intravenous QHS   polyethylene glycol  17 g Oral Daily   predniSONE  40 mg Oral Q breakfast   senna  2 tablet Oral QHS   Continuous Infusions:  sodium chloride 25 mL (08/03/20 1111)   PRN Meds: sodium chloride, acetaminophen **OR** acetaminophen, levalbuterol, magnesium hydroxide, oxybutynin, oxyCODONE  Time spent: 35 minutes  Author: Val Riles. MD [facility] 08/06/2020 3:48 PM  To reach On-call, see care teams to locate the attending and reach out to them via [email or web address]. If 7PM-7AM, please contact night-coverage If you still have difficulty reaching the attending provider, please page the [facility] (Director on Call) for [facility] on amion for assistance.

## 2020-08-06 NOTE — Progress Notes (Signed)
Pulmonary and Critical Care Medicine          Date: 08/06/2020,   MRN# [id number] Ana Washington 04-04-1945     AdmissionWeight: 49.9 kg                 CurrentWeight: 50.1 kg   Referring physician: Sherrie George FNP   CHIEF COMPLAINT:   Acute exacerbation of bronchiectasis due to [place]   This is a very pleasant female with hx of permanent AF, severe bronchiectasis chronically with chronic hypoxemia.  She uses Tobramycin nebulized therapy daily and has been on cipro po in the past for >20 years.  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  She is generally on 3L/min nasal canula and is slowly ambulatory.  Patient came in to hospital due to worsening dyspnea and hypoxemia.   08/01/20- patient seen and evaluated at bedside. She seems improved less labored, she had tachypnea post-prandially only finishing 1/4 meal.  CT Abd showing pleural effusions bilaterally I have ordered lasix today bp is adequate.  H/h is stable but no bm, there is diverticulosis some concern for GIB patient has not had bm though in 24h. FOBT is ordered.  She remains in AF, she reported visual hallucinations post sleeping aid, she has not had digoxin today. Added po amio to reduce volume of infusion. NA is low , changed diet to regular to allow patient to eat more and hopefully improve sodium since shes with episodes of altered mental status.  Long term prognosis is poor unfortunately.   08/02/20-  Patient is slighly improved , appreciate cardiology management and [place] collaboration.  She is able to walk around hallway.  Family at bedside we reviewed medical plan. She had big BM said it was very dark concern for GI blood loss, FOBT was ordered but not performed, have reordered FOBT.   08/04/20-patient slightly improved from previous, saturating 100% on nasal cannula.  Was unable to tolerate MetaNeb with  respiratory therapist for recruitment.  She also met with palliative care team and wishes to continue to work to get home, remains DNR CODE STATUS.  Despite having positive outcomes during this hospitalization patient remains with severe multiorgan level comorbid status and is still appropriate for hospice.  She had similar improvement on 3 previous hospitalizations with subsequent decline once independent at home and is difficult to optimize even with an entire medical care team inpatient therefore my recommendation is unfortunately to proceed with hospice.  I do think that patient is improving however this will likely be a temporary and transient symptomatic recovery.  08/06/20-  patient is feeling weaker today.  We are tapering steroids and this can cause transient malaise. She is breathing more stable.  Her Atrial fibrillation is stable non-accelerated.  She started having diarreah today. We stopped Cefepime antibiotics. She complains of irritation in vagina with burning , this could be from purewick device being there for 8 days I doubt UTI post all the antiboitics shes been delivered thus far.  Plan to dc home per hospitalist and continue PT/OT and outpatient palliative.   PAST MEDICAL HISTORY   Past Medical History:  Diagnosis Date  . Arthritis   . Atrial fibrillation ([place])   . Bronchiectasis ([place])   . CAD (coronary artery disease) 07/30/2017  . Dumping syndrome   . 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 Family history of adverse reaction to anesthesia  sister [name]  . GERD (gastroesophageal reflux disease)   . Headache    MIGRAINES  . Myocardial infarction ([place]) 2007   Non-STEMI  . [name] (postoperative nausea and vomiting)   . Psoriasis   . PUD (peptic ulcer disease)      SURGICAL HISTORY   Past Surgical History:  Procedure Laterality Date  . BACK SURGERY    . CHOLECYSTECTOMY    . ESOPHAGOGASTRODUODENOSCOPY (EGD) WITH PROPOFOL N/A 03/19/2019   Procedure:  ESOPHAGOGASTRODUODENOSCOPY (EGD) WITH PROPOFOL;  Surgeon: [name], MD;  Location: [facility] ENDOSCOPY;  Service: Gastroenterology;  Laterality: N/A;  *Note to anesthesia: Per pt's pulmonologist, if intubating, please extubate to BIPAP.  [name] EYE SURGERY    . FOOT SURGERY    . INTRAMEDULLARY (IM) NAIL INTERTROCHANTERIC Right 09/30/2018   Procedure: INTRAMEDULLARY (IM) NAIL INTERTROCHANTRIC;  Surgeon: Dereck Leep, MD;  Location: [facility] ORS;  Service: Orthopedics;  Laterality: Right;  . KYPHOPLASTY N/A 07/05/2016   Procedure: KYPHOPLASTY T - 9;  Surgeon: Hessie Knows, MD;  Location: [facility] ORS;  Service: Orthopedics;  Laterality: N/A;  . KYPHOPLASTY N/A 11/29/2017   Procedure: Iona Hansen;  Surgeon: Hessie Knows, MD;  Location: [facility] ORS;  Service: Orthopedics;  Laterality: N/A;  L2 and L3  . KYPHOPLASTY N/A 12/18/2017   Procedure: KYPHOPLASTY L1;  Surgeon: Hessie Knows, MD;  Location: [facility] ORS;  Service: Orthopedics;  Laterality: N/A;  . KYPHOPLASTY N/A 01/05/2018   Procedure: KYPHOPLASTY-T11,T12;  Surgeon: Hessie Knows, MD;  Location: [facility] ORS;  Service: Orthopedics;  Laterality: N/A;  . KYPHOPLASTY N/A 04/05/2018   Procedure: T10 KYPHOPLASTY;  Surgeon: Hessie Knows, MD;  Location: [facility] ORS;  Service: Orthopedics;  Laterality: N/A;  . KYPHOPLASTY N/A 04/12/2018   Procedure: KYPHOPLASTY T7,8;  Surgeon: Hessie Knows, MD;  Location: [facility] ORS;  Service: Orthopedics;  Laterality: N/A;  . KYPHOPLASTY N/A 04/19/2018   Procedure: KYPHOPLASTY T5, T6;  Surgeon: Hessie Knows, MD;  Location: [facility] ORS;  Service: Orthopedics;  Laterality: N/A;  . LUNG SURGERY  1990 and 1996  . THOROCOTOMY WITH LOBECTOMY     LEFT LOWER THORACOTOMY / RIGHT MIDDLE LOBECTOMY     FAMILY HISTORY   Family History  Problem Relation Age of Onset  . Hypertension Mother   . Hypertension Father      SOCIAL HISTORY   Social History   Tobacco Use  . Smoking status: Never  . Smokeless tobacco: Never  Vaping Use  . Vaping  Use: Never used  Substance Use Topics  . Alcohol use: No  . Drug use: No     MEDICATIONS    Home Medication:  REM   Current Medication:  Current Facility-Administered Medications:  .  0.9 %  sodium chloride infusion, , Intravenous, PRN, Priscella Mann, Sudheer B, MD, Last Rate: 10 mL/hr at 08/03/20 1111, 25 mL at 08/03/20 1111 .  acetaminophen (TYLENOL) tablet 650 mg, 650 mg, Oral, Q6H PRN, 650 mg at 08/05/20 2201 **OR** acetaminophen (TYLENOL) suppository 650 mg, 650 mg, Rectal, Q6H PRN, Mansy, Jan A, MD .  acetaminophen (TYLENOL) tablet 650 mg, 650 mg, Oral, QPM, Teodoro Spray, MD, 650 mg at 08/05/20 1714 .  acetylcysteine (MUCOMYST) 20 % nebulizer / oral solution 2 mL, 2 mL, Nebulization, BID, Lanney Gins, Nadene Witherspoon, MD, 2 mL at 08/05/20 2049 .  acidophilus (RISAQUAD) capsule 1 capsule, 1 capsule, Oral, Daily, Mansy, Jan A, MD, 1 capsule at 08/05/20 0914 .  amiodarone (PACERONE) tablet 200 mg, 200 mg, Oral, BID, Aerilyn Slee, MD, 200 mg at 08/05/20 2202 .  apixaban (ELIQUIS) tablet 2.5 mg, 2.5 mg, Oral, BID, Mabeline Varas, MD, 2.5 mg at 08/05/20 2201 .  ascorbic acid (VITAMIN C) tablet 1,000 mg, 1,000 mg, Oral, Daily, Mansy, Jan A, MD, 1,000 mg at 08/05/20 0915 .  ceFEPIme (MAXIPIME) 2 g in sodium chloride 0.9 % 100 mL IVPB, 2 g, Intravenous, Q12H, Darnelle Bos, RPH, Last Rate: 200 mL/hr at 08/05/20 2210, 2 g at 08/05/20 2210 .  digoxin (LANOXIN) tablet 0.125 mg, 0.125 mg, Oral, Daily, Lanney Gins, Neko Boyajian, MD, 0.125 mg at 08/05/20 1147 .  gabapentin (NEURONTIN) capsule 300 mg, 300 mg, Oral, TID, Renda Rolls, RPH, 300 mg at 08/05/20 2202 .  guaiFENesin (MUCINEX) 12 hr tablet 600 mg, 600 mg, Oral, BID, Mansy, Jan A, MD, 600 mg at 08/05/20 2202 .  levalbuterol (XOPENEX) nebulizer solution 0.63 mg, 0.63 mg, Nebulization, BID, Lanney Gins, Neyah Ellerman, MD, 0.63 mg at 08/05/20 2049 .  levalbuterol (XOPENEX) nebulizer solution 1.5 mL, 1.5 mL, Nebulization, Q4H PRN, Sreenath, Sudheer B, MD .  LORazepam  (ATIVAN) tablet 0.5 mg, 0.5 mg, Oral, QHS, Sreenath, Sudheer B, MD, 0.5 mg at 08/05/20 2203 .  magnesium hydroxide (MILK OF MAGNESIA) suspension 30 mL, 30 mL, Oral, Daily PRN, Mansy, Jan A, MD, 30 mL at 08/05/20 2346 .  metoprolol tartrate (LOPRESSOR) tablet 25 mg, 25 mg, Oral, BID, Teodoro Spray, MD, 25 mg at 08/05/20 2201 .  oxybutynin (DITROPAN) tablet 5 mg, 5 mg, Oral, Q8H PRN, Sreenath, Sudheer B, MD .  oxyCODONE (Oxy IR/ROXICODONE) immediate release tablet 5 mg, 5 mg, Oral, Q4H PRN, Sreenath, Sudheer B, MD .  pantoprazole (PROTONIX) injection 40 mg, 40 mg, Intravenous, QHS, Raimundo Corbit, MD, 40 mg at 08/05/20 2202 .  polyethylene glycol (MIRALAX / GLYCOLAX) packet 17 g, 17 g, Oral, Daily, Sreenath, Sudheer B, MD, 17 g at 08/05/20 0916 .  predniSONE (DELTASONE) tablet 40 mg, 40 mg, Oral, Q breakfast, Ottie Glazier, MD .  senna (SENOKOT) tablet 17.2 mg, 2 tablet, Oral, QHS, Mikell Camp, MD, 17.2 mg at 08/04/20 2131    ALLERGIES   Codeine, Sulfa antibiotics, and Penicillins     REVIEW OF SYSTEMS    Review of Systems:  Gen:  Denies  fever, sweats, chills weigh loss  HEENT: Denies blurred vision, double vision, ear pain, eye pain, hearing loss, nose bleeds, sore throat Cardiac:  No dizziness, chest pain or heaviness, chest tightness,edema Resp:   Denies cough or sputum porduction, shortness of breath,wheezing, hemoptysis,  Gi: Denies swallowing difficulty, stomach pain, nausea or vomiting, diarrhea, constipation, bowel incontinence Gu:  Denies bladder incontinence, burning urine Ext:   Denies Joint pain, stiffness or swelling Skin: Denies  skin rash, easy bruising or bleeding or hives Endoc:  Denies polyuria, polydipsia , polyphagia or weight change Psych:   Denies depression, insomnia or hallucinations   Other:  All other systems negative   VS: BP 137/88 (BP Location: Right Arm)   Pulse 85   Temp 97.6 F (36.4 C) (Oral)   Resp 20   Ht _0  (1.651 m)   Wt 50.1  kg   SpO2 100%   BMI 18.38 kg/m      PHYSICAL EXAM    GENERAL:NAD, no fevers, chills, no weakness no fatigue HEAD: Normocephalic, atraumatic.  EYES: Pupils equal, round, reactive to light. Extraocular muscles intact. No scleral icterus.  MOUTH: Moist mucosal membrane. Dentition intact. No abscess noted.  EAR, NOSE, THROAT: Clear without exudates. No external lesions.  NECK: Supple. No thyromegaly. No nodules. No JVD.  PULMONARY: rhonchi bilaterally  CARDIOVASCULAR: S1 and S2. Regular rate and rhythm. No murmurs, rubs, or gallops. No edema. Pedal pulses 2+ bilaterally.  GASTROINTESTINAL: Soft, nontender, nondistended. No masses. Positive bowel sounds. No hepatosplenomegaly.  MUSCULOSKELETAL: No swelling, clubbing, or edema. Range of motion full in all extremities.  NEUROLOGIC: Cranial nerves II through XII are intact. No gross focal neurological deficits. Sensation intact. Reflexes intact.  SKIN: No ulceration, lesions, rashes, or cyanosis. Skin warm and dry. Turgor intact.  PSYCHIATRIC: Mood, affect within normal limits. The patient is awake, alert and oriented x 3. Insight, judgment intact.       IMAGING    CT ABDOMEN PELVIS WO CONTRAST  Result Date: 07/31/2020 CLINICAL DATA:  Lower abdominal pain.  Tachycardia and tachypnea. EXAM: CT ABDOMEN AND PELVIS WITHOUT CONTRAST TECHNIQUE: Multidetector CT imaging of the abdomen and pelvis was performed following the standard protocol without IV contrast. COMPARISON:  12/25/2018 and prior CT a chest 07/30/2020 FINDINGS: Lower chest: Stable large hiatal hernia containing most of the stomach. Extensive cylindrical bronchiectasis, airway thickening, and some airway plugging in the lung bases as shown on the exam from 07/30/2020. A small right pleural effusion is mildly increased from 07/30/2020. Mild cardiomegaly noted along with coronary artery and descending thoracic aortic atherosclerotic calcification. High-density material along the left  lung base pleural margin, stable. Hepatobiliary: Cholecystectomy. Prominent common bile duct at 1.3 cm in diameter on image 33 series 5, previously the same on 12/25/2018, this biliary dilatation may represent a physiologic response to cholecystectomy. Pancreas: Unremarkable Spleen: Old granulomatous disease. Adrenals/Urinary Tract: Accentuated density in both renal collecting systems probably left over from recent contrast injection. Stable left mid kidney exophytic lesion compatible with cyst. Contrast medium in the urinary bladder. Adrenal glands unremarkable. Stomach/Bowel: Large hiatal hernia containing the majority of the stomach as well as part of the transverse colon and splenic flexure. No findings of strangulation or obstruction, this herniation is chronic. Sigmoid colon diverticulosis without findings of active diverticulitis. Vascular/Lymphatic: Aortoiliac atherosclerotic vascular disease. Reproductive: Small calcified posterior uterine body fibroid. Other: No supplemental non-categorized findings. Musculoskeletal: Bony demineralization. Deformities from old bilateral pelvic fractures, stable from 12/25/2018. Right hip ORIF. Compression fractures at all lumbar levels and all visualized lower thoracic levels, with vertebral augmentations at all visible lower thoracic levels and at L1, L2, and L3. The lumbar compression fractures at L4 and L5 appear similar to the 12/25/2018 exam. IMPRESSION: 1. A specific cause for patient's lower abdominal pain is not identified. 2. Small right pleural effusion, but increased in size compared to 07/30/2020. 3. Other imaging findings of potential clinical significance: Large hiatal hernia containing most of the stomach as well as part of the transverse colon. Prominent cylindrical bronchiectasis in both lower lobes with scattered airway plugging. Mild cardiomegaly. Aortic Atherosclerosis (ICD10-I70.0). Coronary atherosclerosis. Stable chronic extrahepatic biliary  dilatation, possibly a physiologic response to cholecystectomy. Sigmoid diverticulosis. Small uterine fibroid. Old pelvic fractures. Chronic compression fractures at all visualized thoracic and lumbar levels with vertebral augmentations at all visualized thoracolumbar levels except for L4 and L5. Electronically Signed   By: Van Clines M.D.   On: 07/31/2020 20:56   DG Chest 1 View  Result Date: 07/30/2020 CLINICAL DATA:  COVID diagnosis 5 weeks prior, known metastatic disease, increasing shortness of breath with productive cough EXAM: CHEST  1 VIEW COMPARISON:  Radiograph 07/14/2020, CT 07/14/2020 FINDINGS: Large left diaphragmatic eventration. Some adjacent passive atelectatic changes. Diffuse reticulonodular opacities present throughout both lungs. Postsurgical changes in the left hilum and right  upper lung are stable from priors and likely reflecting multiple resections. No pneumothorax. No visible layering effusion. Stable cardiomediastinal contours with a calcified aorta. Multilevel vertebroplasty changes are seen. No acute osseous abnormality or suspicious osseous lesion. The osseous structures appear diffusely demineralized which may limit detection of small or nondisplaced fractures. Degenerative changes in the shoulders, right greater than left with high-riding humeral head suggesting underlying rotator cuff insufficiency. Telemetry leads overlie the chest. IMPRESSION: Diffuse reticulonodular opacities throughout the lungs, can be seen in the setting as atypical mycobacterial infection as suggested previously versus additional acute superimposed infection or inflammation. Large left diaphragmatic eventration. Chronic scarring and architectural distortion with areas of postsurgical changes in the left hilum and right upper lung. Aortic Atherosclerosis (ICD10-I70.0). Multilevel vertebroplasty. Electronically Signed   By: Lovena Le M.D.   On: 07/30/2020 20:34   DG Chest 2 View  Result Date:  07/14/2020 CLINICAL DATA:  75 year old female with shortness of breath and cough. EXAM: CHEST - 2 VIEW COMPARISON:  Chest CT 04/29/2020 and earlier. FINDINGS: Seated AP and lateral views of the chest. Large chronic left diaphragmatic hernia appears stable. Stable cardiac size and mediastinal contours. Bronchiectasis better demonstrated by CT. Chronic staple line in the right upper lobe. Stable coarse bilateral pulmonary interstitial opacity. No pneumothorax, pulmonary edema, or acute pulmonary opacity. Diffuse spinal compression fractures with at least 10 levels previously augmented. Stable visualized osseous structures. Negative visible bowel gas pattern. IMPRESSION: Chronic lung disease with bronchiectasis. Chronic left diaphragmatic hernia. No acute cardiopulmonary abnormality identified. Electronically Signed   By: Genevie Ann M.D.   On: 07/14/2020 11:54   CT Angio Chest PE W and/or Wo Contrast  Result Date: 07/30/2020 CLINICAL DATA:  Subacute COVID pneumonia, cough EXAM: CT ANGIOGRAPHY CHEST WITH CONTRAST TECHNIQUE: Multidetector CT imaging of the chest was performed using the standard protocol during bolus administration of intravenous contrast. Multiplanar CT image reconstructions and MIPs were obtained to evaluate the vascular anatomy. CONTRAST:  50m OMNIPAQUE IOHEXOL 350 MG/ML SOLN COMPARISON:  07/14/2020 FINDINGS: Cardiovascular: There is adequate opacification of the residual pulmonary arterial tree. No intraluminal filling defect to suggest acute pulmonary embolism. The central pulmonary arteries are enlarged in keeping with changes of pulmonary arterial hypertension, unchanged from prior examination. Mild multi-vessel coronary artery calcification. Global cardiac size within normal limits. No pericardial effusion. Moderate atherosclerotic calcification within the abdominal aorta. No aortic aneurysm. Mediastinum/Nodes: No pathologic thoracic adenopathy. The visualized thyroid is unremarkable. Small  amount of debris is seen within the esophagus possibly reflecting changes of esophageal dysmotility or gastroesophageal reflux. A large hiatal hernia is seen extending into the left hemithorax with herniation of the splenic flexure of the colon and nearly the entire stomach into the left hemithorax. There is marked tortuosity of the thoracic aorta which extends into the posterior left hemithorax as result of the a large hernia. Lungs/Pleura: Status post partial right upper lobectomy and right middle lobectomy as well as left lower lobectomy. 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 There is superimposed areas of cylindrical and varicoid bronchiectasis again identified, more focal within the residual right upper lobe and basilar right lower lobe though chronic infection with atypical organisms such as MAI could result in such an appearance, an additional consideration should include the sequela of remote or recurrent infection and/or aspiration. There is these changes appears stable since prior examination. No new focal pulmonary nodules or infiltrates. No pneumothorax or pleural effusion. Upper  Abdomen: Status post cholecystectomy.  No acute abnormality. Musculoskeletal: Multilevel thoracolumbar vertebroplasty has been performed of each visualized level inferior to T4. Stable remote compression fracture of T3. No acute bone abnormality. Review of the MIP images confirms the above findings. IMPRESSION: No pulmonary embolism. Mild coronary artery calcification. Morphologic changes in keeping with pulmonary arterial hypertension. Status post partial right upper lobe, right middle lobe, and left lower lobectomy. Extensive scattered areas of cylindrical and varicoid bronchiectasis with mild tree-in-bud nodularity stable since prior examination. Differential considerations include the sequela of remote or recurrent infection,  aspiration, and atypical infection as can be seen with mycobacterial or atypical fungal infection. Mosaic attenuation of the pulmonary parenchyma in keeping with air trapping secondary to small airways disease. Aortic Atherosclerosis (ICD10-I70.0). Electronically Signed   By: Fidela Salisbury MD   On: 07/30/2020 22:16   CT Angio Chest PE W and/or Wo Contrast  Result Date: 07/14/2020 CLINICAL DATA:  PE suspected, high prob EXAM: CT ANGIOGRAPHY CHEST WITH CONTRAST TECHNIQUE: Multidetector CT imaging of the chest was performed using the standard protocol during bolus administration of intravenous contrast. Multiplanar CT image reconstructions and MIPs were obtained to evaluate the vascular anatomy. CONTRAST:  3m OMNIPAQUE IOHEXOL 350 MG/ML SOLN COMPARISON:  Chest CT 04/29/2020 FINDINGS: Cardiovascular: Satisfactory opacification of the pulmonary arteries to the segmental level. No evidence of pulmonary embolism. Unchanged enlarged central pulmonary arteries compatible with pulmonary hypertension. Unchanged mild cardiomegaly. Atherosclerotic calcifications of the aorta. Coronary artery calcifications. Mediastinum/Nodes: There is no mediastinal, hilar, or axillary lymphadenopathy. Thyroid is unremarkable. There is a large hiatal hernia containing the a hepatic flexure of the colon and the stomach, similar to prior exam. Lungs/Pleura: Diffuse bronchial wall thickening with multifocal bronchiectasis, most severe in the right upper lobe and lung bases bilaterally. Multifocal mucous plugging. Similar architectural distortion in the of the left lower lobe. Prior resections of the lateral segment of the right upper lobe, the right middle lobe, and inferior lingula. There is small right-sided subpleural nodularity, increased from prior exam, largest measuring 2.0 x 0.6 cm, and another measuring 0.9 x 0.8 cm (series 7, images 53 and 41 respectively). Similar subpleural nodularity in the left upper lobe. Scattered lung  scarring and tree-in-bud nodularity. Mosaic attenuation bilaterally, likely due to air trapping. Upper Abdomen: Unchanged left renal cyst. Prior cholecystectomy. No acute abnormality. Musculoskeletal: Unchanged multiple thoracolumbar compression deformities with vertebroplasties. Unchanged chronic T3 compression deformity. Partially visualized severe degenerative changes in the lower cervical spine. Review of the MIP images confirms the above findings. IMPRESSION: No evidence of pulmonary embolism. Unchanged enlarged pulmonary arteries suggestive of pulmonary hypertension. 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 Increased subpleural nodularity on the right, which may represent progression of disease. Chronic mosaic attenuation of the lungs likely due to air trapping. Recommend follow-up chest CT in 3 months and follow-up with pulmonology. Aortic Atherosclerosis (ICD10-I70.0). Electronically Signed   By: JMaurine Simmering  On: 07/14/2020 13:33   DG Chest Port 1 View  Result Date: 08/02/2020 CLINICAL DATA:  Chronic hypoxemia. EXAM: PORTABLE CHEST 1 VIEW COMPARISON:  07/30/2020 FINDINGS: Stable cardiomediastinal contours. Postoperative changes with suture chains noted in the perihilar right lung and right upper lobe as well as the left midlung and perihilar left mid lung. Diffuse chronic interstitial advanced chronic interstitial changes are identified throughout both lungs reflecting underlying postinflammatory changes including bronchiectasis, interstitial scarring and reticulation. Small right pleural effusion unchanged. Large hiatal hernia is identified which  overlies the left heart and left lower lung. Unchanged. IMPRESSION: 1. Severe chronic interstitial lung disease appears unchanged from previous exam. 2. Stable appearance of large hiatal hernia. Electronically Signed   By: Kerby Moors  M.D.   On: 08/02/2020 12:31      ASSESSMENT/PLAN    Acute exacerbation of bronchiectasis  - empirically on zithromax and cefepime for possible sepsis    - agree with stopping antibiotics    -prednisone 40 with taper to 10 chronically     S/p COVID19 pneumonia -mild residual on CT from this admission    Chronic AF    - continue eliquis - no hemoptysis -started amio stopped bb and dig  Appreciate cardiology - Dr Ubaldo Glassing -reduced eliquis to 2.5 due to diverticulosis on ct abd and reduction in hb -amio po 200 bid transition from gtt  Acute blood loss anemia  FOBT   - on eliquis - 5 po bid>>2.5 bid   End of life care  - palliative consultation     Thank you for allowing me to participate in the care of this patient.   Patient/Family are satisfied with care plan and all questions have been answered.   This document was prepared using Dragon voice recognition software and may include unintentional dictation errors.     Ottie Glazier, M.D.  Division of [place]

## 2020-08-06 NOTE — Care Management Important Message (Signed)
Important Message  Patient Details  Name: ZAKYRA DILWORTH MRN: [id number] Date of Birth: [birth year]   Medicare Important Message Given:  Yes  Reviewed Medicare IM with patient via room phone due to isolation status.  Aware of right to appeal.  Confirmed she received copy emailed during previous admission.  Declined copy of Medicare IM at this time.     Dannette Barbara 08/06/2020, 12:55 PM

## 2020-08-07 DIAGNOSIS — J471 Bronchiectasis with (acute) exacerbation: Secondary | ICD-10-CM | POA: Diagnosis not present

## 2020-08-07 DIAGNOSIS — J189 Pneumonia, unspecified organism: Secondary | ICD-10-CM | POA: Diagnosis not present

## 2020-08-07 LAB — CBC WITH DIFFERENTIAL/PLATELET
Abs Immature Granulocytes: 0.54 10*3/uL — ABNORMAL HIGH (ref 0.00–0.07)
Basophils Absolute: 0 10*3/uL (ref 0.0–0.1)
Basophils Relative: 0 %
Eosinophils Absolute: 0 10*3/uL (ref 0.0–0.5)
Eosinophils Relative: 0 %
HCT: 29.2 % — ABNORMAL LOW (ref 36.0–46.0)
Hemoglobin: 10.1 g/dL — ABNORMAL LOW (ref 12.0–15.0)
Immature Granulocytes: 4 %
Lymphocytes Relative: 8 %
Lymphs Abs: 1.2 10*3/uL (ref 0.7–4.0)
MCH: 33.9 pg (ref 26.0–34.0)
MCHC: 34.6 g/dL (ref 30.0–36.0)
MCV: 98 fL (ref 80.0–100.0)
Monocytes Absolute: 1 10*3/uL (ref 0.1–1.0)
Monocytes Relative: 7 %
Neutro Abs: 11.8 10*3/uL — ABNORMAL HIGH (ref 1.7–7.7)
Neutrophils Relative %: 81 %
Platelets: 278 10*3/uL (ref 150–400)
RBC: 2.98 MIL/uL — ABNORMAL LOW (ref 3.87–5.11)
RDW: 12.7 % (ref 11.5–15.5)
WBC: 14.6 10*3/uL — ABNORMAL HIGH (ref 4.0–10.5)
nRBC: 0 % (ref 0.0–0.2)

## 2020-08-07 MED ORDER — GUAIFENESIN ER 600 MG PO TB12: 600.0000 mg | ORAL_TABLET | Freq: Two times a day (BID) | ORAL | 0 refills | Status: AC

## 2020-08-07 MED ORDER — METOPROLOL TARTRATE 25 MG PO TABS: 25.0000 mg | ORAL_TABLET | Freq: Two times a day (BID) | ORAL | 0 refills | Status: DC

## 2020-08-07 MED ORDER — PREDNISONE 10 MG (21) PO TBPK: ORAL_TABLET | ORAL | 0 refills | Status: DC

## 2020-08-07 MED ORDER — AMIODARONE HCL 200 MG PO TABS: 200.0000 mg | ORAL_TABLET | Freq: Two times a day (BID) | ORAL | 0 refills | Status: AC

## 2020-08-07 NOTE — TOC Transition Note (Signed)
Transition of Care [facility]) - CM/SW Discharge Note   Patient Details  Name: Ana Washington MRN: [id number] Date of Birth: 11-13-1945  [facility] CM/SW Contact:  Alberteen Sam, LCSW Phone Number: 08/07/2020, 1:38 PM   Clinical Narrative:      Patient to discharge today, CSW notes patient was previously set up with home health services through [facility].   Per MD Lanney Gins, patient will be participating in the pulmonary lung works program 2-3 times a week for 45 minutes which will replace home therapies. He reports no need for home health therapies at this time.   Patient to continue to be followed by outpatient palliative with [facility] and they have been informed of patient's discharge today.    Final next level of care: Home/Self Care Barriers to Discharge: No Barriers Identified   Patient Goals and CMS Choice Patient states their goals for this hospitalization and ongoing recovery are:: to go home CMS Medicare.gov Compare Post Acute Care list provided to:: Patient Choice offered to / list presented to : Patient  Discharge Placement                       Discharge Plan and Services                          HH Arranged: PT, OT [facility] Agency: [place] Date [place]: 08/05/20 Time [place]: [id number] Representative spoke with at [place]: [place]  Social Determinants of Health ([place]) Interventions     Readmission Risk Interventions Readmission Risk Prevention Plan 07/17/2020 01/06/2018  Transportation Screening Complete Complete  PCP or Specialist Appt within 5-7 Days - Complete  PCP or Specialist Appt within 3-5 Days Complete -  Home Care Screening - Complete  Medication Review (RN CM) - Complete  HRI or Home Care Consult Complete -  Social Work Consult for [place] Planning/Counseling Complete -  Palliative Care Screening Not Applicable -  Medication Review Press photographer) Complete -  Some recent data  might be hidden

## 2020-08-07 NOTE — Progress Notes (Signed)
Palliative Medicine RN Note: Ana Washington called the PMT office from her room. She states that Dr Lanney Gins now wants her to do "lung therapy" at the hospital instead of going home w [facility]. She was very clear that she wants to hear from palliative care next week after she gets home, and she told me she expects to leave today.  I called and left a message for her SW Caryl Pina; I asked that she go see Ana Washington and make sure everyone is on the same page. Unfortunately, the PMT provider who knos her is not on service today. I also called Olivia Mackie, the [facility] liaison, to ensure they follow up next week, once the pt is home.  Marjie Skiff Shawana Knoch, RN, BSN, [facility] Palliative Medicine Team 08/07/2020 11:43 AM Office [phone]

## 2020-08-07 NOTE — Discharge Summary (Signed)
[facility] Discharge Summary   Patient: Ana Washington TGG:[id number]  PCP: Ana Pink, MD  Date of admission: 07/30/2020   Date of discharge:  08/07/2020     Discharge Diagnoses:  Principal diagnosis Chronic MAC bronchiectasis Active Problems:   CAP (community acquired pneumonia)   Bronchiectasis with acute exacerbation ([place])   Admitted From: Home Disposition:  Home with HHPT  Recommendations for Outpatient Follow-up:  PCP: in 1 wk Pulmonologist in 1 week Follow up LABS/TEST: CBC, BMP in 1 week   Diet recommendation: Regular diet  Activity: The patient is advised to gradually reintroduce usual activities, as tolerated  Discharge Condition: stable  Code Status: DNR   History of present illness: As per the H and P dictated on admission 75 y.o. Caucasian female with medical history significant for coronary artery disease, essential hypertension, GERD, as well as history of bronchiectasis and chronic MAC, , chronic respiratory failure (on 2lpm via [place] at home), Takotsubo cardiomyopathy (2015 at [facility]), gastroesophageal reflux disease, hypertension, paroxysmal atrial fibrillation (on Eliquis), generalized anxiety disorder, diastolic congestive heart failure (Echo 03/2020 EF 50-55% with G1DD), with recent COVID 19 infection 16 days ago, who presented to the emergency room with acute onset of persistent cough since then with expectoration of yellowish green sputum and worsening dyspnea today as well as wheezing.  She has been feeling "very very weak".  She was admitted here recently for exacerbation of her bronchiectasis from 6/14-6/20.  She was discharged home on p.o. Levaquin and prednisone.  She finished a course of IV remdesivir when she was here  The patient is well-known to both Dr. Ubaldo Glassing and Dr. Lanney Gins.  I appreciate consultations from both of them.  Clinical presentation is likely mixed combination of exacerbation of bronchiectasis, partial contribution from suspected  urinary tract infection and rapid atrial fibrillation.  Patient was in rapid atrial fibrillation on admission.  Initially controlled however occasionally becomes tachycardic and acutely symptomatic.  Responded to IV Cardizem however rate became uncontrolled shortly thereafter.  After pulmonary evaluation was started on amiodarone infusion. Patient appears to be responding to initial therapy.  Heart rate improved on amiodarone infusion.  Patient was endorsing abdominal pain.  CT abdomen pelvis did not reveal any abnormalities.  Unclear driving force for abdominal pain.  Patient reports a bandlike sensation around the upper abdomen. 7/3: Appears to be responding to therapy.  Greatly appreciate pulmonary and cardiac recommendations.  Abdominal pain improved.  No bowel movement reported yet.  All cultures no growth to date.  Reports better sleep after discontinuation of trazodone. 7/4: Poor sleep.  FOBT negative.  Afib remains difficult to control 7/5: Symptomatically improving.  Reports that daytime Ativan makes her too lethargic.  Seen by pulmonary consultant in follow-up.  Patient is symptomatically improving however has had similar improvements on previous hospitalizations and subsequent deterioration once independent at home. Hospital Course:  # Acute exacerbation of bronchiectasis, Possible urinary tract infection, Sepsis secondary uti VS pulmonary infection.  Patient has advanced lung disease with frequent readmissions, Most recently 2 weeks ago. Patient is well-known to pulmonary consultant, recommendations appreciated S/p IV cefepime d/c'd on 7/7, blood cultures, no growth to date, As needed mucolytic's, Xopenex every 4 hours as needed. S/p IV steroids per pulmonary recommendations, switch to oral prednisone 40 mg daily from 7/7, started prednisone Dosepak on discharge.  Recommended to follow with pulmonologist as an outpatient.  # Atrial fibrillation with rapid ventricular response, Likely triggered  by underlying infection and lung disease. Home regimen includes digoxin and  Toprol-XL, Cardizem CD was discontinued on previous admission. Patient known to Dr. Ubaldo Glassing, Initially responded to Cardizem bolus however became rapid again, S/P amiodarone gtt. on 7/1, digoxin and metoprolol held, Transition to p.o. amiodarone 7/2, Occasional elevation heart rate.  Patient with high anxiety.  Suspect this is worsening her afib. Resumed Ativan 0.5 mg nightly, patient did not like Xanax which was discontinued. Continue p.o. amiodarone 200 twice daily, Continue digoxin _0 mcg p.o. daily, Lopressor 25 mg p.o. twice daily. Eliquis for VTE prophylaxis # Essential hypertension, Continue metoprolol 25 mg p.o. twice daily # GERD, PPI # Anxiety, Resumed Ativan 0.5 mg nightly, patient did not like Xanax which was discontinued # Functional decline and Frequent readmissions The patient had expressed an interest in engaging hospice services due to decline of function and frequent readmissions.  She would like to avoid being hospitalized in the future.  I agree with her assessment.  We have engaged consultation from both hospice liaison as well as palliative care services. Per previous pulmonary recommendation patient has had similar improvements on previous hospitalizations and subsequent deterioration upon return home.  Patient was interested in hospice services but patient improved so did not qualify for hospice. Patient preferred to go home and palliative care will follow as an outpatient, and they will call hospice services on the board when needed.  Patient understands that her condition is terminal and stated that she would not not prefer to come back to the hospital and would rather call hospice at home. Body mass index is 18.38 kg/m.  Nutrition Problem: Increased nutrient needs Etiology: acute illness (sepsis) Nutrition Interventions: Interventions: Magic cup  - Patient was instructed, not to drive, operate heavy  machinery, perform activities at heights, swimming or participation in water activities or provide baby sitting services while on Pain, Sleep and Anxiety Medications; until her outpatient Physician has advised to do so again.  - Also recommended to not to take more than prescribed Pain, Sleep and Anxiety Medications. Patient was seen by physical therapy, who recommended Home health, which was arranged. On the day of the discharge the patient's vitals were stable, and no other acute medical condition were reported by patient. the patient was felt safe to be discharge at Home with Home health.  Consultants: [place] and [facility] Procedures: None  Discharge Exam: General: Appear in mild distress, no Rash; Oral Mucosa Clear, moist. Cardiovascular: S1 and S2 Present, no Murmur, Respiratory: increased respiratory effort, Bilateral Air entry present and b/l Crackles, Mild wheezes Abdomen: Bowel Sound present, Soft and no tenderness, no hernia Extremities: no Pedal edema, no calf tenderness Neurology: alert and oriented to time, place, and person affect appropriate.  Filed Weights   07/30/20 2003 07/31/20 2200  Weight: 49.9 kg 50.1 kg   Vitals:   08/07/20 0759 08/07/20 1117  BP:  (!) 154/90  Pulse:  66  Resp:  18  Temp:  97.6 F (36.4 C)  SpO2: 100% 100%    DISCHARGE MEDICATION: Allergies as of 08/07/2020       Reactions   Codeine Nausea And Vomiting   Sulfa Antibiotics Diarrhea   Penicillins Rash   Has patient had a PCN reaction causing immediate rash, facial/tongue/throat swelling, SOB or lightheadedness with hypotension: Unknown Has patient had a PCN reaction causing severe rash involving mucus membranes or skin necrosis: Unknown Has patient had a PCN reaction that required hospitalization: Unknown Has patient had a PCN reaction occurring within the last 10 years: No If all of the above answers  are "NO", then may proceed with Cephalosporin use.        Medication List     STOP  taking these medications    amLODipine 2.5 MG tablet Commonly known as: NORVASC   azithromycin 250 MG tablet Commonly known as: ZITHROMAX   diltiazem 180 MG 24 hr capsule Commonly known as: CARDIZEM CD   furosemide 20 MG tablet Commonly known as: LASIX   glycopyrrolate 1 MG tablet Commonly known as: ROBINUL   losartan 100 MG tablet Commonly known as: COZAAR   metoprolol succinate 100 MG 24 hr tablet Commonly known as: TOPROL-XL   metoprolol succinate 25 MG 24 hr tablet Commonly known as: TOPROL-XL   potassium chloride 10 MEQ tablet Commonly known as: KLOR-CON   predniSONE 10 MG tablet Commonly known as: DELTASONE Replaced by: predniSONE 10 MG (21) Tbpk tablet   spironolactone 25 MG tablet Commonly known as: ALDACTONE   tobramycin (PF) 300 MG/5ML nebulizer solution Commonly known as: TOBI       TAKE these medications    acetaminophen 500 MG tablet Commonly known as: TYLENOL Take 1,000 mg by mouth every 6 (six) hours as needed for mild pain or headache.   acetylcysteine 20 % nebulizer solution Commonly known as: MUCOMYST Take 4 mLs by nebulization daily.   Align 4 MG Caps Take 4 mg by mouth daily.   amiodarone 200 MG tablet Commonly known as: PACERONE Take 1 tablet (200 mg total) by mouth 2 (two) times daily.   apixaban 5 MG Tabs tablet Commonly known as: ELIQUIS Take 5 mg by mouth 2 (two) times daily.   benzonatate 200 MG capsule Commonly known as: TESSALON Take 200 mg by mouth 3 (three) times daily as needed.   Combivent Respimat 20-100 MCG/ACT Aers respimat Generic drug: Ipratropium-Albuterol 1 puff every 6 (six) hours as needed for wheezing.   digoxin 0.125 MG tablet Commonly known as: LANOXIN Take 125 mcg by mouth daily.   ferrous sulfate 325 (65 FE) MG EC tablet Take 325 mg by mouth daily.   gabapentin 300 MG capsule Commonly known as: NEURONTIN Take 300 mg by mouth 4 (four) times daily.   guaiFENesin 600 MG 12 hr tablet Commonly  known as: MUCINEX Take 1 tablet (600 mg total) by mouth 2 (two) times daily for 10 days.   levalbuterol 0.31 MG/3ML nebulizer solution Commonly known as: XOPENEX Inhale 3 mLs into the lungs every 6 (six) hours as needed.   LORazepam 0.5 MG tablet Commonly known as: ATIVAN Take 1 tablet (0.5 mg total) by mouth at bedtime.   metoprolol tartrate 25 MG tablet Commonly known as: LOPRESSOR Take 1 tablet (25 mg total) by mouth 2 (two) times daily.   NAC 600 MG Caps Generic drug: Acetylcysteine Take 600 mg by mouth daily after breakfast.   omeprazole 20 MG capsule Commonly known as: PRILOSEC Take 20 mg by mouth daily.   polyethylene glycol 17 g packet Commonly known as: MIRALAX / GLYCOLAX Take 17 g by mouth daily as needed for severe constipation.   predniSONE 10 MG (21) Tbpk tablet Commonly known as: STERAPRED UNI-PAK 21 TAB 40 mg p.o. daily x 2 days, 30 mg p.o. daily x2 days, 20 mg p.o. daily x 2 days, 10 mg p.o. daily x3 days Replaces: predniSONE 10 MG tablet   senna 8.6 MG Tabs tablet Commonly known as: SENOKOT Take 2 tablets (17.2 mg total) by mouth at bedtime.   vitamin C 1000 MG tablet Take 1,000 mg by mouth daily.  Allergies  Allergen Reactions   Codeine Nausea And Vomiting   Sulfa Antibiotics Diarrhea   Penicillins Rash    Has patient had a PCN reaction causing immediate rash, facial/tongue/throat swelling, SOB or lightheadedness with hypotension: Unknown Has patient had a PCN reaction causing severe rash involving mucus membranes or skin necrosis: Unknown Has patient had a PCN reaction that required hospitalization: Unknown Has patient had a PCN reaction occurring within the last 10 years: No If all of the above answers are "NO", then may proceed with Cephalosporin use.    Discharge Instructions     Call MD for:  difficulty breathing, headache or visual disturbances   Complete by: As directed    Call MD for:  persistant nausea and vomiting   Complete  by: As directed    Call MD for:  temperature >100.4   Complete by: As directed    Diet - low sodium heart healthy   Complete by: As directed    Discharge instructions   Complete by: As directed    Follow with PCP in 1 week Follow with pulmonologist in 1 week Follow-up with cardiologist in 1 week Follow with palliative care as an outpatient   Increase activity slowly   Complete by: As directed        The results of significant diagnostics from this hospitalization (including imaging, microbiology, ancillary and laboratory) are listed below for reference.    Significant Diagnostic Studies: CT ABDOMEN PELVIS WO CONTRAST  Result Date: 07/31/2020 CLINICAL DATA:  Lower abdominal pain.  Tachycardia and tachypnea. EXAM: CT ABDOMEN AND PELVIS WITHOUT CONTRAST TECHNIQUE: Multidetector CT imaging of the abdomen and pelvis was performed following the standard protocol without IV contrast. COMPARISON:  12/25/2018 and prior CT a chest 07/30/2020 FINDINGS: Lower chest: Stable large hiatal hernia containing most of the stomach. Extensive cylindrical bronchiectasis, airway thickening, and some airway plugging in the lung bases as shown on the exam from 07/30/2020. A small right pleural effusion is mildly increased from 07/30/2020. Mild cardiomegaly noted along with coronary artery and descending thoracic aortic atherosclerotic calcification. High-density material along the left lung base pleural margin, stable. Hepatobiliary: Cholecystectomy. Prominent common bile duct at 1.3 cm in diameter on image 33 series 5, previously the same on 12/25/2018, this biliary dilatation may represent a physiologic response to cholecystectomy. Pancreas: Unremarkable Spleen: Old granulomatous disease. Adrenals/Urinary Tract: Accentuated density in both renal collecting systems probably left over from recent contrast injection. Stable left mid kidney exophytic lesion compatible with cyst. Contrast medium in the urinary bladder.  Adrenal glands unremarkable. Stomach/Bowel: Large hiatal hernia containing the majority of the stomach as well as part of the transverse colon and splenic flexure. No findings of strangulation or obstruction, this herniation is chronic. Sigmoid colon diverticulosis without findings of active diverticulitis. Vascular/Lymphatic: Aortoiliac atherosclerotic vascular disease. Reproductive: Small calcified posterior uterine body fibroid. Other: No supplemental non-categorized findings. Musculoskeletal: Bony demineralization. Deformities from old bilateral pelvic fractures, stable from 12/25/2018. Right hip ORIF. Compression fractures at all lumbar levels and all visualized lower thoracic levels, with vertebral augmentations at all visible lower thoracic levels and at L1, L2, and L3. The lumbar compression fractures at L4 and L5 appear similar to the 12/25/2018 exam. IMPRESSION: 1. A specific cause for patient's lower abdominal pain is not identified. 2. Small right pleural effusion, but increased in size compared to 07/30/2020. 3. Other imaging findings of potential clinical significance: Large hiatal hernia containing most of the stomach as well as part of the transverse colon. Prominent cylindrical  bronchiectasis in both lower lobes with scattered airway plugging. Mild cardiomegaly. Aortic Atherosclerosis (ICD10-I70.0). Coronary atherosclerosis. Stable chronic extrahepatic biliary dilatation, possibly a physiologic response to cholecystectomy. Sigmoid diverticulosis. Small uterine fibroid. Old pelvic fractures. Chronic compression fractures at all visualized thoracic and lumbar levels with vertebral augmentations at all visualized thoracolumbar levels except for L4 and L5. Electronically Signed   By: Van Clines M.D.   On: 07/31/2020 20:56   DG Chest 1 View  Result Date: 07/30/2020 CLINICAL DATA:  COVID diagnosis 5 weeks prior, known metastatic disease, increasing shortness of breath with productive cough  EXAM: CHEST  1 VIEW COMPARISON:  Radiograph 07/14/2020, CT 07/14/2020 FINDINGS: Large left diaphragmatic eventration. Some adjacent passive atelectatic changes. Diffuse reticulonodular opacities present throughout both lungs. Postsurgical changes in the left hilum and right upper lung are stable from priors and likely reflecting multiple resections. No pneumothorax. No visible layering effusion. Stable cardiomediastinal contours with a calcified aorta. Multilevel vertebroplasty changes are seen. No acute osseous abnormality or suspicious osseous lesion. The osseous structures appear diffusely demineralized which may limit detection of small or nondisplaced fractures. Degenerative changes in the shoulders, right greater than left with high-riding humeral head suggesting underlying rotator cuff insufficiency. Telemetry leads overlie the chest. IMPRESSION: Diffuse reticulonodular opacities throughout the lungs, can be seen in the setting as atypical mycobacterial infection as suggested previously versus additional acute superimposed infection or inflammation. Large left diaphragmatic eventration. Chronic scarring and architectural distortion with areas of postsurgical changes in the left hilum and right upper lung. Aortic Atherosclerosis (ICD10-I70.0). Multilevel vertebroplasty. Electronically Signed   By: Lovena Le M.D.   On: 07/30/2020 20:34   DG Chest 2 View  Result Date: 07/14/2020 CLINICAL DATA:  75 year old female with shortness of breath and cough. EXAM: CHEST - 2 VIEW COMPARISON:  Chest CT 04/29/2020 and earlier. FINDINGS: Seated AP and lateral views of the chest. Large chronic left diaphragmatic hernia appears stable. Stable cardiac size and mediastinal contours. Bronchiectasis better demonstrated by CT. Chronic staple line in the right upper lobe. Stable coarse bilateral pulmonary interstitial opacity. No pneumothorax, pulmonary edema, or acute pulmonary opacity. Diffuse spinal compression fractures  with at least 10 levels previously augmented. Stable visualized osseous structures. Negative visible bowel gas pattern. IMPRESSION: Chronic lung disease with bronchiectasis. Chronic left diaphragmatic hernia. No acute cardiopulmonary abnormality identified. Electronically Signed   By: Genevie Ann M.D.   On: 07/14/2020 11:54   CT Angio Chest PE W and/or Wo Contrast  Result Date: 07/30/2020 CLINICAL DATA:  Subacute COVID pneumonia, cough EXAM: CT ANGIOGRAPHY CHEST WITH CONTRAST TECHNIQUE: Multidetector CT imaging of the chest was performed using the standard protocol during bolus administration of intravenous contrast. Multiplanar CT image reconstructions and MIPs were obtained to evaluate the vascular anatomy. CONTRAST:  78m OMNIPAQUE IOHEXOL 350 MG/ML SOLN COMPARISON:  07/14/2020 FINDINGS: Cardiovascular: There is adequate opacification of the residual pulmonary arterial tree. No intraluminal filling defect to suggest acute pulmonary embolism. The central pulmonary arteries are enlarged in keeping with changes of pulmonary arterial hypertension, unchanged from prior examination. Mild multi-vessel coronary artery calcification. Global cardiac size within normal limits. No pericardial effusion. Moderate atherosclerotic calcification within the abdominal aorta. No aortic aneurysm. Mediastinum/Nodes: No pathologic thoracic adenopathy. The visualized thyroid is unremarkable. Small amount of debris is seen within the esophagus possibly reflecting changes of esophageal dysmotility or gastroesophageal reflux. A large hiatal hernia is seen extending into the left hemithorax with herniation of the splenic flexure of the colon and nearly the entire stomach into the  left hemithorax. There is marked tortuosity of the thoracic aorta which extends into the posterior left hemithorax as result of the a large hernia. Lungs/Pleura: Status post partial right upper lobectomy and right middle lobectomy as well as left lower lobectomy.  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 There is superimposed areas of cylindrical and varicoid bronchiectasis again identified, more focal within the residual right upper lobe and basilar right lower lobe though chronic infection with atypical organisms such as MAI could result in such an appearance, an additional consideration should include the sequela of remote or recurrent infection and/or aspiration. There is these changes appears stable since prior examination. No new focal pulmonary nodules or infiltrates. No pneumothorax or pleural effusion. Upper Abdomen: Status post cholecystectomy.  No acute abnormality. Musculoskeletal: Multilevel thoracolumbar vertebroplasty has been performed of each visualized level inferior to T4. Stable remote compression fracture of T3. No acute bone abnormality. Review of the MIP images confirms the above findings. IMPRESSION: No pulmonary embolism. Mild coronary artery calcification. Morphologic changes in keeping with pulmonary arterial hypertension. Status post partial right upper lobe, right middle lobe, and left lower lobectomy. Extensive scattered areas of cylindrical and varicoid bronchiectasis with mild tree-in-bud nodularity stable since prior examination. Differential considerations include the sequela of remote or recurrent infection, aspiration, and atypical infection as can be seen with mycobacterial or atypical fungal infection. Mosaic attenuation of the pulmonary parenchyma in keeping with air trapping secondary to small airways disease. Aortic Atherosclerosis (ICD10-I70.0). Electronically Signed   By: Fidela Salisbury MD   On: 07/30/2020 22:16   CT Angio Chest PE W and/or Wo Contrast  Result Date: 07/14/2020 CLINICAL DATA:  PE suspected, high prob EXAM: CT ANGIOGRAPHY CHEST WITH CONTRAST TECHNIQUE: Multidetector CT imaging of the chest was performed using the standard  protocol during bolus administration of intravenous contrast. Multiplanar CT image reconstructions and MIPs were obtained to evaluate the vascular anatomy. CONTRAST:  38m OMNIPAQUE IOHEXOL 350 MG/ML SOLN COMPARISON:  Chest CT 04/29/2020 FINDINGS: Cardiovascular: Satisfactory opacification of the pulmonary arteries to the segmental level. No evidence of pulmonary embolism. Unchanged enlarged central pulmonary arteries compatible with pulmonary hypertension. Unchanged mild cardiomegaly. Atherosclerotic calcifications of the aorta. Coronary artery calcifications. Mediastinum/Nodes: There is no mediastinal, hilar, or axillary lymphadenopathy. Thyroid is unremarkable. There is a large hiatal hernia containing the a hepatic flexure of the colon and the stomach, similar to prior exam. Lungs/Pleura: Diffuse bronchial wall thickening with multifocal bronchiectasis, most severe in the right upper lobe and lung bases bilaterally. Multifocal mucous plugging. Similar architectural distortion in the of the left lower lobe. Prior resections of the lateral segment of the right upper lobe, the right middle lobe, and inferior lingula. There is small right-sided subpleural nodularity, increased from prior exam, largest measuring 2.0 x 0.6 cm, and another measuring 0.9 x 0.8 cm (series 7, images 53 and 41 respectively). Similar subpleural nodularity in the left upper lobe. Scattered lung scarring and tree-in-bud nodularity. Mosaic attenuation bilaterally, likely due to air trapping. Upper Abdomen: Unchanged left renal cyst. Prior cholecystectomy. No acute abnormality. Musculoskeletal: Unchanged multiple thoracolumbar compression deformities with vertebroplasties. Unchanged chronic T3 compression deformity. Partially visualized severe degenerative changes in the lower cervical spine. Review of the MIP images confirms the above findings. IMPRESSION: No evidence of pulmonary embolism. Unchanged enlarged pulmonary arteries suggestive of  pulmonary hypertension. 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  Increased subpleural nodularity on the right, which may represent progression of disease. Chronic mosaic attenuation of the lungs likely due to air trapping. Recommend follow-up chest CT in 3 months and follow-up with pulmonology. Aortic Atherosclerosis (ICD10-I70.0). Electronically Signed   By: Maurine Simmering   On: 07/14/2020 13:33   DG Chest Port 1 View  Result Date: 08/02/2020 CLINICAL DATA:  Chronic hypoxemia. EXAM: PORTABLE CHEST 1 VIEW COMPARISON:  07/30/2020 FINDINGS: Stable cardiomediastinal contours. Postoperative changes with suture chains noted in the perihilar right lung and right upper lobe as well as the left midlung and perihilar left mid lung. Diffuse chronic interstitial advanced chronic interstitial changes are identified throughout both lungs reflecting underlying postinflammatory changes including bronchiectasis, interstitial scarring and reticulation. Small right pleural effusion unchanged. Large hiatal hernia is identified which overlies the left heart and left lower lung. Unchanged. IMPRESSION: 1. Severe chronic interstitial lung disease appears unchanged from previous exam. 2. Stable appearance of large hiatal hernia. Electronically Signed   By: Kerby Moors M.D.   On: 08/02/2020 12:31    Microbiology: Recent Results (from the past 240 hour(s))  Blood culture (single)     Status: None   Collection Time: 07/30/20  8:36 PM   Specimen: BLOOD  Result Value Ref Range Status   Specimen Description BLOOD BLOOD RIGHT FOREARM  Final   Special Requests   Final    BOTTLES DRAWN AEROBIC AND ANAEROBIC Blood Culture adequate volume   Culture   Final    NO GROWTH 5 DAYS Performed at [facility], [address] [place], [place] [address]    Report Status 08/04/2020 FINAL  Final  Culture, blood  (single)     Status: None   Collection Time: 07/30/20  9:31 PM   Specimen: BLOOD  Result Value Ref Range Status   Specimen Description BLOOD BLOOD LEFT FOREARM  Final   Special Requests   Final    BOTTLES DRAWN AEROBIC AND ANAEROBIC Blood Culture adequate volume   Culture   Final    NO GROWTH 5 DAYS Performed at [facility], [address] [place], [place] [address]    Report Status 08/04/2020 FINAL  Final  CULTURE, BLOOD (ROUTINE X 2) w Reflex to ID Panel     Status: None   Collection Time: 07/31/20  9:42 AM   Specimen: BLOOD  Result Value Ref Range Status   Specimen Description BLOOD  LEFT AC  Final   Special Requests   Final    BOTTLES DRAWN AEROBIC AND ANAEROBIC Blood Culture results may not be optimal due to an excessive volume of blood received in culture bottles   Culture   Final    NO GROWTH 5 DAYS Performed at [name], [address] [place], [place] [address]    Report Status 08/05/2020 FINAL  Final  CULTURE, BLOOD (ROUTINE X 2) w Reflex to ID Panel     Status: None   Collection Time: 07/31/20 10:27 AM   Specimen: BLOOD  Result Value Ref Range Status   Specimen Description BLOOD L FOREARM  Final   Special Requests   Final    BOTTLES DRAWN AEROBIC AND ANAEROBIC Blood Culture results may not be optimal due to an excessive volume of blood received in culture bottles   Culture   Final    NO GROWTH 5 DAYS Performed at [facility], [address], [place], [place] [address]    Report Status 08/05/2020 FINAL  Final     Labs:  CBC: Recent Labs  Lab 08/03/20 0607 08/04/20 0729 08/05/20 0548 08/06/20 0722 08/07/20 0526  WBC 13.8* 17.2* 19.0* 19.5* 14.6*  NEUTROABS 12.0* 14.6* 16.1* 15.9* 11.8*  HGB 9.2* 10.3* 10.2* 10.9* 10.1*  HCT 26.7* 29.7* 29.1* 30.8* 29.2*  MCV 97.1 97.1 93.9 97.2 98.0  PLT 257 [phone]   Basic Metabolic Panel: Recent Labs  Lab 08/01/20 0521 08/02/20 0644 08/03/20 0607 08/04/20 0729  08/05/20 0548  NA 131* 130* 130* 126* 126*  K 4.0 5.0 5.0 5.4* 4.8  CL 91* 91* 91* 85* 85*  CO2 32 33* 32 32 35*  GLUCOSE 149* 131* 124* 84 101*  BUN 9 12 25* 26* 30*  CREATININE 0.42* 0.47 0.55 0.59 0.60  CALCIUM 8.7* 8.8* 8.9 8.8* 8.8*   Liver Function Tests: No results for input(s): AST, ALT, ALKPHOS, BILITOT, PROT, ALBUMIN in the last 168 hours. No results for input(s): LIPASE, AMYLASE in the last 168 hours. No results for input(s): AMMONIA in the last 168 hours. Cardiac Enzymes: No results for input(s): CKTOTAL, CKMB, CKMBINDEX, TROPONINI in the last 168 hours. BNP (last 3 results) Recent Labs    04/29/20 0108 07/14/20 1118 07/24/20 0940  BNP 71.0 181.4* 340.9*   CBG: No results for input(s): GLUCAP in the last 168 hours.  Time spent: 35 minutes  Signed:  Val Riles  [facility]  08/07/2020 12:24 PM

## 2020-08-07 NOTE — Progress Notes (Signed)
Pulmonary and Critical Care Medicine          Date: 08/07/2020,   MRN# [id number] Ana Washington [birth year]     AdmissionWeight: 49.9 kg                 CurrentWeight: 50.1 kg   Referring physician: Sherrie George FNP   CHIEF COMPLAINT:   Acute exacerbation of bronchiectasis due to [place]   This is a very pleasant female with hx of permanent AF, severe bronchiectasis chronically with chronic hypoxemia.  She uses Tobramycin nebulized therapy daily and has been on cipro po in the past for >20 years.  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  She is generally on 3L/min nasal canula and is slowly ambulatory.  Patient came in to hospital due to worsening dyspnea and hypoxemia.   08/01/20- patient seen and evaluated at bedside. She seems improved less labored, she had tachypnea post-prandially only finishing 1/4 meal.  CT Abd showing pleural effusions bilaterally I have ordered lasix today bp is adequate.  H/h is stable but no bm, there is diverticulosis some concern for GIB patient has not had bm though in 24h. FOBT is ordered.  She remains in AF, she reported visual hallucinations post sleeping aid, she has not had digoxin today. Added po amio to reduce volume of infusion. NA is low , changed diet to regular to allow patient to eat more and hopefully improve sodium since shes with episodes of altered mental status.  Long term prognosis is poor unfortunately.   08/02/20-  Patient is slighly improved , appreciate cardiology management and [place] collaboration.  She is able to walk around hallway.  Family at bedside we reviewed medical plan. She had big BM said it was very dark concern for GI blood loss, FOBT was ordered but not performed, have reordered FOBT.   08/04/20-patient slightly improved from previous, saturating 100% on nasal cannula.  Was unable to tolerate Meta[employer]eb with  respiratory therapist for recruitment.  She also met with palliative care team and wishes to continue to work to get home, remains DNR CODE STATUS.  Despite having positive outcomes during this hospitalization patient remains with severe multiorgan level comorbid status and is still appropriate for hospice.  She had similar improvement on 3 previous hospitalizations with subsequent decline once independent at home and is difficult to optimize even with an entire medical care team inpatient therefore my recommendation is unfortunately to proceed with hospice.  I do think that patient is improving however this will likely be a temporary and transient symptomatic recovery.  08/06/20-  patient is feeling weaker today.  We are tapering steroids and this can cause transient malaise. She is breathing more stable.  Her Atrial fibrillation is stable non-accelerated.  She started having diarreah today. We stopped Cefepime antibiotics. She complains of irritation in vagina with burning , this could be from purewick device being there for 8 days I doubt UTI post all the antiboitics shes been delivered thus far.  Plan to dc home per hospitalist and continue PT/OT and outpatient palliative.   08/07/20- Patient is improved, she is on 2L/min [place].  She is being optimized for dc home.   PAST MEDICAL HISTORY   Past Medical History:  Diagnosis Date  . Arthritis   . Atrial fibrillation ([place])   . Bronchiectasis ([place])   . CAD (coronary artery disease) 07/30/2017  . Dumping  syndrome   . 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 Family history of adverse reaction to anesthesia    sister [name]  . GERD (gastroesophageal reflux disease)   . Headache    MIGRAINES  . Myocardial infarction ([place]) 2007   Non-STEMI  . [name] (postoperative nausea and vomiting)   . Psoriasis   . PUD (peptic ulcer disease)      SURGICAL HISTORY   Past Surgical History:  Procedure Laterality Date  . BACK SURGERY    . CHOLECYSTECTOMY    .  ESOPHAGOGASTRODUODENOSCOPY (EGD) WITH PROPOFOL N/A 03/19/2019   Procedure: ESOPHAGOGASTRODUODENOSCOPY (EGD) WITH PROPOFOL;  Surgeon: [name], MD;  Location: [facility] ENDOSCOPY;  Service: Gastroenterology;  Laterality: N/A;  *Note to anesthesia: Per pt's pulmonologist, if intubating, please extubate to BIPAP.  [name] EYE SURGERY    . FOOT SURGERY    . INTRAMEDULLARY (IM) NAIL INTERTROCHANTERIC Right 09/30/2018   Procedure: INTRAMEDULLARY (IM) NAIL INTERTROCHANTRIC;  Surgeon: Dereck Leep, MD;  Location: [facility] ORS;  Service: Orthopedics;  Laterality: Right;  . KYPHOPLASTY N/A 07/05/2016   Procedure: KYPHOPLASTY T - 9;  Surgeon: Hessie Knows, MD;  Location: [facility] ORS;  Service: Orthopedics;  Laterality: N/A;  . KYPHOPLASTY N/A 11/29/2017   Procedure: Iona Hansen;  Surgeon: Hessie Knows, MD;  Location: [facility] ORS;  Service: Orthopedics;  Laterality: N/A;  L2 and L3  . KYPHOPLASTY N/A 12/18/2017   Procedure: KYPHOPLASTY L1;  Surgeon: Hessie Knows, MD;  Location: [facility] ORS;  Service: Orthopedics;  Laterality: N/A;  . KYPHOPLASTY N/A 01/05/2018   Procedure: KYPHOPLASTY-T11,T12;  Surgeon: Hessie Knows, MD;  Location: [facility] ORS;  Service: Orthopedics;  Laterality: N/A;  . KYPHOPLASTY N/A 04/05/2018   Procedure: T10 KYPHOPLASTY;  Surgeon: Hessie Knows, MD;  Location: [facility] ORS;  Service: Orthopedics;  Laterality: N/A;  . KYPHOPLASTY N/A 04/12/2018   Procedure: KYPHOPLASTY T7,8;  Surgeon: Hessie Knows, MD;  Location: [facility] ORS;  Service: Orthopedics;  Laterality: N/A;  . KYPHOPLASTY N/A 04/19/2018   Procedure: KYPHOPLASTY T5, T6;  Surgeon: Hessie Knows, MD;  Location: [facility] ORS;  Service: Orthopedics;  Laterality: N/A;  . LUNG SURGERY  1990 and 1996  . THOROCOTOMY WITH LOBECTOMY     LEFT LOWER THORACOTOMY / RIGHT MIDDLE LOBECTOMY     FAMILY HISTORY   Family History  Problem Relation Age of Onset  . Hypertension Mother   . Hypertension Father      SOCIAL HISTORY   Social History   Tobacco Use  .  Smoking status: Never  . Smokeless tobacco: Never  Vaping Use  . Vaping Use: Never used  Substance Use Topics  . Alcohol use: No  . Drug use: No     MEDICATIONS    Home Medication:  REM   Current Medication:  Current Facility-Administered Medications:  .  0.9 %  sodium chloride infusion, , Intravenous, PRN, Priscella Mann, Sudheer B, MD, Last Rate: 10 mL/hr at 08/03/20 1111, 25 mL at 08/03/20 1111 .  acetaminophen (TYLENOL) tablet 650 mg, 650 mg, Oral, Q6H PRN, 650 mg at 08/06/20 2331 **OR** acetaminophen (TYLENOL) suppository 650 mg, 650 mg, Rectal, Q6H PRN, Mansy, Jan A, MD .  acetaminophen (TYLENOL) tablet 650 mg, 650 mg, Oral, QPM, Teodoro Spray, MD, 650 mg at 08/06/20 1704 .  acetylcysteine (MUCOMYST) 20 % nebulizer / oral solution 2 mL, 2 mL, Nebulization, BID, Lanney Gins, Geraldy Akridge, MD, 2 mL at 08/07/20 0759 .  acidophilus (RISAQUAD) capsule 1 capsule, 1 capsule, Oral, Daily, Mansy, Jan A, MD, 1 capsule at 08/07/20 1018 .  amiodarone (PACERONE) tablet 200 mg, 200 mg, Oral, BID, Jadelynn Boylan, MD, 200 mg at 08/07/20 1019 .  apixaban (ELIQUIS) tablet 2.5 mg, 2.5 mg, Oral, BID, Trish Mancinelli, MD, 2.5 mg at 08/07/20 1018 .  ascorbic acid (VITAMIN C) tablet 1,000 mg, 1,000 mg, Oral, Daily, Mansy, Jan A, MD, 1,000 mg at 08/07/20 1019 .  digoxin (LANOXIN) tablet 0.125 mg, 0.125 mg, Oral, Daily, Deloros Beretta, MD, 0.125 mg at 08/07/20 1029 .  gabapentin (NEURONTIN) capsule 300 mg, 300 mg, Oral, TID, Renda Rolls, RPH, 300 mg at 08/07/20 1019 .  guaiFENesin (MUCINEX) 12 hr tablet 600 mg, 600 mg, Oral, BID, Mansy, Jan A, MD, 600 mg at 08/07/20 1018 .  levalbuterol (XOPENEX) nebulizer solution 0.63 mg, 0.63 mg, Nebulization, BID, Lanney Gins, Tracy Gerken, MD, 0.63 mg at 08/07/20 0759 .  levalbuterol (XOPENEX) nebulizer solution 1.5 mL, 1.5 mL, Nebulization, Q4H PRN, Sreenath, Sudheer B, MD .  LORazepam (ATIVAN) tablet 0.5 mg, 0.5 mg, Oral, QHS, Sreenath, Sudheer B, MD, 0.5 mg at 08/06/20 2201 .   magnesium hydroxide (MILK OF MAGNESIA) suspension 30 mL, 30 mL, Oral, Daily PRN, Mansy, Jan A, MD, 30 mL at 08/05/20 2346 .  metoprolol tartrate (LOPRESSOR) tablet 25 mg, 25 mg, Oral, BID, Teodoro Spray, MD, 25 mg at 08/07/20 1019 .  oxybutynin (DITROPAN) tablet 5 mg, 5 mg, Oral, Q8H PRN, Sreenath, Sudheer B, MD .  oxyCODONE (Oxy IR/ROXICODONE) immediate release tablet 5 mg, 5 mg, Oral, Q4H PRN, Sreenath, Sudheer B, MD .  pantoprazole (PROTONIX) injection 40 mg, 40 mg, Intravenous, QHS, Lidwina Kaner, MD, 40 mg at 08/06/20 2200 .  polyethylene glycol (MIRALAX / GLYCOLAX) packet 17 g, 17 g, Oral, Daily, Sreenath, Sudheer B, MD, 17 g at 08/05/20 0916 .  predniSONE (DELTASONE) tablet 40 mg, 40 mg, Oral, Q breakfast, Vedika Dumlao, MD, 40 mg at 08/07/20 0831 .  senna (SENOKOT) tablet 17.2 mg, 2 tablet, Oral, QHS, Neve Branscomb, MD, 17.2 mg at 08/04/20 2131    ALLERGIES   Codeine, Sulfa antibiotics, and Penicillins     REVIEW OF SYSTEMS    Review of Systems:  Gen:  Denies  fever, sweats, chills weigh loss  HEENT: Denies blurred vision, double vision, ear pain, eye pain, hearing loss, nose bleeds, sore throat Cardiac:  No dizziness, chest pain or heaviness, chest tightness,edema Resp:   Denies cough or sputum porduction, shortness of breath,wheezing, hemoptysis,  Gi: Denies swallowing difficulty, stomach pain, nausea or vomiting, diarrhea, constipation, bowel incontinence Gu:  Denies bladder incontinence, burning urine Ext:   Denies Joint pain, stiffness or swelling Skin: Denies  skin rash, easy bruising or bleeding or hives Endoc:  Denies polyuria, polydipsia , polyphagia or weight change Psych:   Denies depression, insomnia or hallucinations   Other:  All other systems negative   VS: BP (!) 159/81 (BP Location: Right Arm)   Pulse 60   Temp 97.8 F (36.6 C)   Resp 18   Ht _0  (1.651 m)   Wt 50.1 kg   SpO2 100%   BMI 18.38 kg/m      PHYSICAL EXAM     GENERAL:NAD, no fevers, chills, no weakness no fatigue HEAD: Normocephalic, atraumatic.  EYES: Pupils equal, round, reactive to light. Extraocular muscles intact. No scleral icterus.  MOUTH: Moist mucosal membrane. Dentition intact. No abscess noted.  EAR, NOSE, THROAT: Clear without exudates. No external lesions.  NECK: Supple. No thyromegaly. No nodules. No JVD.  PULMONARY: rhonchi bilaterally  CARDIOVASCULAR: S1 and S2. Regular rate  and rhythm. No murmurs, rubs, or gallops. No edema. Pedal pulses 2+ bilaterally.  GASTROINTESTINAL: Soft, nontender, nondistended. No masses. Positive bowel sounds. No hepatosplenomegaly.  MUSCULOSKELETAL: No swelling, clubbing, or edema. Range of motion full in all extremities.  NEUROLOGIC: Cranial nerves II through XII are intact. No gross focal neurological deficits. Sensation intact. Reflexes intact.  SKIN: No ulceration, lesions, rashes, or cyanosis. Skin warm and dry. Turgor intact.  PSYCHIATRIC: Mood, affect within normal limits. The patient is awake, alert and oriented x 3. Insight, judgment intact.       IMAGING    CT ABDOMEN PELVIS WO CONTRAST  Result Date: 07/31/2020 CLINICAL DATA:  Lower abdominal pain.  Tachycardia and tachypnea. EXAM: CT ABDOMEN AND PELVIS WITHOUT CONTRAST TECHNIQUE: Multidetector CT imaging of the abdomen and pelvis was performed following the standard protocol without IV contrast. COMPARISON:  12/25/2018 and prior CT a chest 07/30/2020 FINDINGS: Lower chest: Stable large hiatal hernia containing most of the stomach. Extensive cylindrical bronchiectasis, airway thickening, and some airway plugging in the lung bases as shown on the exam from 07/30/2020. A small right pleural effusion is mildly increased from 07/30/2020. Mild cardiomegaly noted along with coronary artery and descending thoracic aortic atherosclerotic calcification. High-density material along the left lung base pleural margin, stable. Hepatobiliary:  Cholecystectomy. Prominent common bile duct at 1.3 cm in diameter on image 33 series 5, previously the same on 12/25/2018, this biliary dilatation may represent a physiologic response to cholecystectomy. Pancreas: Unremarkable Spleen: Old granulomatous disease. Adrenals/Urinary Tract: Accentuated density in both renal collecting systems probably left over from recent contrast injection. Stable left mid kidney exophytic lesion compatible with cyst. Contrast medium in the urinary bladder. Adrenal glands unremarkable. Stomach/Bowel: Large hiatal hernia containing the majority of the stomach as well as part of the transverse colon and splenic flexure. No findings of strangulation or obstruction, this herniation is chronic. Sigmoid colon diverticulosis without findings of active diverticulitis. Vascular/Lymphatic: Aortoiliac atherosclerotic vascular disease. Reproductive: Small calcified posterior uterine body fibroid. Other: No supplemental non-categorized findings. Musculoskeletal: Bony demineralization. Deformities from old bilateral pelvic fractures, stable from 12/25/2018. Right hip ORIF. Compression fractures at all lumbar levels and all visualized lower thoracic levels, with vertebral augmentations at all visible lower thoracic levels and at L1, L2, and L3. The lumbar compression fractures at L4 and L5 appear similar to the 12/25/2018 exam. IMPRESSION: 1. A specific cause for patient's lower abdominal pain is not identified. 2. Small right pleural effusion, but increased in size compared to 07/30/2020. 3. Other imaging findings of potential clinical significance: Large hiatal hernia containing most of the stomach as well as part of the transverse colon. Prominent cylindrical bronchiectasis in both lower lobes with scattered airway plugging. Mild cardiomegaly. Aortic Atherosclerosis (ICD10-I70.0). Coronary atherosclerosis. Stable chronic extrahepatic biliary dilatation, possibly a physiologic response to  cholecystectomy. Sigmoid diverticulosis. Small uterine fibroid. Old pelvic fractures. Chronic compression fractures at all visualized thoracic and lumbar levels with vertebral augmentations at all visualized thoracolumbar levels except for L4 and L5. Electronically Signed   By: Van Clines M.D.   On: 07/31/2020 20:56   DG Chest 1 View  Result Date: 07/30/2020 CLINICAL DATA:  COVID diagnosis 5 weeks prior, known metastatic disease, increasing shortness of breath with productive cough EXAM: CHEST  1 VIEW COMPARISON:  Radiograph 07/14/2020, CT 07/14/2020 FINDINGS: Large left diaphragmatic eventration. Some adjacent passive atelectatic changes. Diffuse reticulonodular opacities present throughout both lungs. Postsurgical changes in the left hilum and right upper lung are stable from priors and likely reflecting multiple  resections. No pneumothorax. No visible layering effusion. Stable cardiomediastinal contours with a calcified aorta. Multilevel vertebroplasty changes are seen. No acute osseous abnormality or suspicious osseous lesion. The osseous structures appear diffusely demineralized which may limit detection of small or nondisplaced fractures. Degenerative changes in the shoulders, right greater than left with high-riding humeral head suggesting underlying rotator cuff insufficiency. Telemetry leads overlie the chest. IMPRESSION: Diffuse reticulonodular opacities throughout the lungs, can be seen in the setting as atypical mycobacterial infection as suggested previously versus additional acute superimposed infection or inflammation. Large left diaphragmatic eventration. Chronic scarring and architectural distortion with areas of postsurgical changes in the left hilum and right upper lung. Aortic Atherosclerosis (ICD10-I70.0). Multilevel vertebroplasty. Electronically Signed   By: Lovena Le M.D.   On: 07/30/2020 20:34   DG Chest 2 View  Result Date: 07/14/2020 CLINICAL DATA:  75 year old female  with shortness of breath and cough. EXAM: CHEST - 2 VIEW COMPARISON:  Chest CT 04/29/2020 and earlier. FINDINGS: Seated AP and lateral views of the chest. Large chronic left diaphragmatic hernia appears stable. Stable cardiac size and mediastinal contours. Bronchiectasis better demonstrated by CT. Chronic staple line in the right upper lobe. Stable coarse bilateral pulmonary interstitial opacity. No pneumothorax, pulmonary edema, or acute pulmonary opacity. Diffuse spinal compression fractures with at least 10 levels previously augmented. Stable visualized osseous structures. Negative visible bowel gas pattern. IMPRESSION: Chronic lung disease with bronchiectasis. Chronic left diaphragmatic hernia. No acute cardiopulmonary abnormality identified. Electronically Signed   By: Genevie Ann M.D.   On: 07/14/2020 11:54   CT Angio Chest PE W and/or Wo Contrast  Result Date: 07/30/2020 CLINICAL DATA:  Subacute COVID pneumonia, cough EXAM: CT ANGIOGRAPHY CHEST WITH CONTRAST TECHNIQUE: Multidetector CT imaging of the chest was performed using the standard protocol during bolus administration of intravenous contrast. Multiplanar CT image reconstructions and MIPs were obtained to evaluate the vascular anatomy. CONTRAST:  82m OMNIPAQUE IOHEXOL 350 MG/ML SOLN COMPARISON:  07/14/2020 FINDINGS: Cardiovascular: There is adequate opacification of the residual pulmonary arterial tree. No intraluminal filling defect to suggest acute pulmonary embolism. The central pulmonary arteries are enlarged in keeping with changes of pulmonary arterial hypertension, unchanged from prior examination. Mild multi-vessel coronary artery calcification. Global cardiac size within normal limits. No pericardial effusion. Moderate atherosclerotic calcification within the abdominal aorta. No aortic aneurysm. Mediastinum/Nodes: No pathologic thoracic adenopathy. The visualized thyroid is unremarkable. Small amount of debris is seen within the esophagus  possibly reflecting changes of esophageal dysmotility or gastroesophageal reflux. A large hiatal hernia is seen extending into the left hemithorax with herniation of the splenic flexure of the colon and nearly the entire stomach into the left hemithorax. There is marked tortuosity of the thoracic aorta which extends into the posterior left hemithorax as result of the a large hernia. Lungs/Pleura: Status post partial right upper lobectomy and right middle lobectomy as well as left lower lobectomy. 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 There is superimposed areas of cylindrical and varicoid bronchiectasis again identified, more focal within the residual right upper lobe and basilar right lower lobe though chronic infection with atypical organisms such as MAI could result in such an appearance, an additional consideration should include the sequela of remote or recurrent infection and/or aspiration. There is these changes appears stable since prior examination. No new focal pulmonary nodules or infiltrates. No pneumothorax or pleural effusion. Upper Abdomen: Status post cholecystectomy.  No acute abnormality. Musculoskeletal: Multilevel  thoracolumbar vertebroplasty has been performed of each visualized level inferior to T4. Stable remote compression fracture of T3. No acute bone abnormality. Review of the MIP images confirms the above findings. IMPRESSION: No pulmonary embolism. Mild coronary artery calcification. Morphologic changes in keeping with pulmonary arterial hypertension. Status post partial right upper lobe, right middle lobe, and left lower lobectomy. Extensive scattered areas of cylindrical and varicoid bronchiectasis with mild tree-in-bud nodularity stable since prior examination. Differential considerations include the sequela of remote or recurrent infection, aspiration, and atypical infection as can be seen  with mycobacterial or atypical fungal infection. Mosaic attenuation of the pulmonary parenchyma in keeping with air trapping secondary to small airways disease. Aortic Atherosclerosis (ICD10-I70.0). Electronically Signed   By: Fidela Salisbury MD   On: 07/30/2020 22:16   CT Angio Chest PE W and/or Wo Contrast  Result Date: 07/14/2020 CLINICAL DATA:  PE suspected, high prob EXAM: CT ANGIOGRAPHY CHEST WITH CONTRAST TECHNIQUE: Multidetector CT imaging of the chest was performed using the standard protocol during bolus administration of intravenous contrast. Multiplanar CT image reconstructions and MIPs were obtained to evaluate the vascular anatomy. CONTRAST:  72m OMNIPAQUE IOHEXOL 350 MG/ML SOLN COMPARISON:  Chest CT 04/29/2020 FINDINGS: Cardiovascular: Satisfactory opacification of the pulmonary arteries to the segmental level. No evidence of pulmonary embolism. Unchanged enlarged central pulmonary arteries compatible with pulmonary hypertension. Unchanged mild cardiomegaly. Atherosclerotic calcifications of the aorta. Coronary artery calcifications. Mediastinum/Nodes: There is no mediastinal, hilar, or axillary lymphadenopathy. Thyroid is unremarkable. There is a large hiatal hernia containing the a hepatic flexure of the colon and the stomach, similar to prior exam. Lungs/Pleura: Diffuse bronchial wall thickening with multifocal bronchiectasis, most severe in the right upper lobe and lung bases bilaterally. Multifocal mucous plugging. Similar architectural distortion in the of the left lower lobe. Prior resections of the lateral segment of the right upper lobe, the right middle lobe, and inferior lingula. There is small right-sided subpleural nodularity, increased from prior exam, largest measuring 2.0 x 0.6 cm, and another measuring 0.9 x 0.8 cm (series 7, images 53 and 41 respectively). Similar subpleural nodularity in the left upper lobe. Scattered lung scarring and tree-in-bud nodularity. Mosaic attenuation  bilaterally, likely due to air trapping. Upper Abdomen: Unchanged left renal cyst. Prior cholecystectomy. No acute abnormality. Musculoskeletal: Unchanged multiple thoracolumbar compression deformities with vertebroplasties. Unchanged chronic T3 compression deformity. Partially visualized severe degenerative changes in the lower cervical spine. Review of the MIP images confirms the above findings. IMPRESSION: No evidence of pulmonary embolism. Unchanged enlarged pulmonary arteries suggestive of pulmonary hypertension. 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 Increased subpleural nodularity on the right, which may represent progression of disease. Chronic mosaic attenuation of the lungs likely due to air trapping. Recommend follow-up chest CT in 3 months and follow-up with pulmonology. Aortic Atherosclerosis (ICD10-I70.0). Electronically Signed   By: JMaurine Simmering  On: 07/14/2020 13:33   DG Chest Port 1 View  Result Date: 08/02/2020 CLINICAL DATA:  Chronic hypoxemia. EXAM: PORTABLE CHEST 1 VIEW COMPARISON:  07/30/2020 FINDINGS: Stable cardiomediastinal contours. Postoperative changes with suture chains noted in the perihilar right lung and right upper lobe as well as the left midlung and perihilar left mid lung. Diffuse chronic interstitial advanced chronic interstitial changes are identified throughout both lungs reflecting underlying postinflammatory changes including bronchiectasis, interstitial scarring and reticulation. Small right pleural effusion unchanged. Large hiatal hernia is identified which overlies the left heart and left lower lung. Unchanged. IMPRESSION:  1. Severe chronic interstitial lung disease appears unchanged from previous exam. 2. Stable appearance of large hiatal hernia. Electronically Signed   By: Kerby Moors M.D.   On: 08/02/2020 12:31      ASSESSMENT/PLAN     Acute exacerbation of bronchiectasis  - empirically on zithromax and cefepime for possible sepsis    - agree with stopping antibiotics    -prednisone 40 with taper to 10 chronically     S/p COVID19 pneumonia -mild residual on CT from this admission    Chronic AF    - continue eliquis - no hemoptysis -started amio stopped bb and dig  Appreciate cardiology - Dr Ubaldo Glassing -reduced eliquis to 2.5 due to diverticulosis on ct abd and reduction in hb -amio po 200 bid transition from gtt   Acute blood loss anemia  FOBT   - on eliquis - 5 po bid>>2.5 bid   End of life care  - palliative consultation       Thank you for allowing me to participate in the care of this patient.   Patient/Family are satisfied with care plan and all questions have been answered.   This document was prepared using Dragon voice recognition software and may include unintentional dictation errors.     Ottie Glazier, M.D.  Division of [facility]

## 2020-08-07 NOTE — Progress Notes (Signed)
Physical Therapy Treatment Patient Details Name: Ana Washington MRN: [id number] DOB: September 25, 1945 Today's Date: 08/07/2020    History of Present Illness Ana Washington is a 75 y.o. 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    PT Comments    Pt resting in bed upon PT arrival; agreeable to PT session and reporting plan to discharge home today.  Modified independent with bed mobility; min assist with transfers (pt reports having lift chair and BSC at home that help her stand) with RW use; and CGA ambulating 20 feet with RW (limited distance ambulating d/t pt needing to toilet and for clean-up).  Pt's gown wet (appeared to be from [place] issue) requiring gown and underwear change during session and then pt needing to have bowel movement on BSC.  Pt stood multiple times (stood for extended time) for clean-up (NT present assisting) and although pt requesting to walk again once finished with toileting/clean-up pt ultimately declined further therapy/walking d/t fatigue.  HR 60-80 bpm during sessions activities.  Educated pt on need for 24/7 assist at home with functional mobility for safety: pt verbalizing understanding.  Will continue to focus on strengthening and progressive functional mobility during hospitalization.   Follow Up Recommendations  Home health PT;Supervision/Assistance - 24 hour     Equipment Recommendations  Rolling walker with 5" wheels    Recommendations for Other Services       Precautions / Restrictions Precautions Precautions: Fall Restrictions Weight Bearing Restrictions: No    Mobility  Bed Mobility Overal bed mobility: Modified  Independent Bed Mobility: Supine to Sit;Sit to Supine     Supine to sit: Modified independent (Device/Increase time) Sit to supine: Modified independent (Device/Increase time)   General bed mobility comments: mild increased effort to perform on own    Transfers Overall transfer level: Needs assistance Equipment used: Rolling walker (2 wheeled) Transfers: Sit to/from [facility] Sit to Stand: Min assist Stand pivot transfers: Min guard (stand step turn bed to/from [facility] with RW)       General transfer comment: x2 trials standing from bed and x2 trials standing from BSC up to RW; minimal assist to initiate stand up to walker  Ambulation/Gait Ambulation/Gait assistance: Min guard Gait Distance (Feet): 20 Feet Assistive device: Rolling walker (2 wheeled) Gait Pattern/deviations: Step-through pattern Gait velocity: decreased   General Gait Details: limited distance d/t pt needing to toilet and for clean-up   Stairs             Wheelchair Mobility    Modified Rankin (Stroke Patients Only)       Balance Overall balance assessment: Needs assistance Sitting-balance support: No upper extremity supported;Feet supported Sitting balance-Leahy Scale: Good Sitting balance - Comments: steady sitting reaching within BOS   Standing balance support: Single extremity supported Standing balance-Leahy Scale: Fair Standing balance comment: pt requiring at least single UE support for static standing balance                            Cognition Arousal/Alertness: Awake/alert Behavior During Therapy: WFL for tasks assessed/performed Overall Cognitive Status: Within Functional Limits for tasks assessed  Exercises      General Comments  Nursing cleared pt for participation in physical therapy.  Pt agreeable to PT session.      Pertinent Vitals/Pain Pain Assessment: No/denies pain Pain  Intervention(s): Limited activity within patient's tolerance;Monitored during session;Repositioned Vitals (HR and O2 on 2 L via nasal cannula) stable and WFL throughout treatment session.    Home Living                      Prior Function            PT Goals (current goals can now be found in the care plan section) Acute Rehab PT Goals Patient Stated Goal: go home PT Goal Formulation: With patient/family Time For Goal Achievement: 08/15/20 Potential to Achieve Goals: Good Progress towards PT goals: Progressing toward goals    Frequency    Min 2X/week      PT Plan Discharge plan needs to be updated    Co-evaluation              AM-PAC PT "6 Clicks" Mobility   Outcome Measure  Help needed turning from your back to your side while in a flat bed without using bedrails?: None Help needed moving from lying on your back to sitting on the side of a flat bed without using bedrails?: None Help needed moving to and from a bed to a chair (including a wheelchair)?: A Little Help needed standing up from a chair using your arms (e.g., wheelchair or bedside chair)?: A Little Help needed to walk in hospital room?: A Little Help needed climbing 3-5 steps with a railing? : A Little 6 Click Score: 20    End of Session Equipment Utilized During Treatment: Gait belt;Oxygen (2 L O2 via nasal cannula) Activity Tolerance: Patient tolerated treatment well;Patient limited by fatigue Patient left: in bed;with call bell/phone within reach;with bed alarm set Nurse Communication: Mobility status;Precautions PT Visit Diagnosis: Muscle weakness (generalized) (M62.81);Difficulty in walking, not elsewhere classified (R26.2)     Time: 4650-3546 PT Time Calculation (min) (ACUTE ONLY): 64 min  Charges:  $Gait Training: 8-22 mins $Therapeutic Activity: 38-52 mins                    Leitha Bleak, PT 08/07/20, 12:19 PM

## 2020-08-11 ENCOUNTER — Telehealth: Payer: Self-pay

## 2020-08-11 ENCOUNTER — Telehealth: Payer: Self-pay | Admitting: Nurse Practitioner

## 2020-08-11 NOTE — Telephone Encounter (Signed)
Spoke with patient regarding the Palliative referral/services and she was in agreement with beginning services with [place].  I have scheduled an In-home Consult for 08/25/20 @ 12:30 PM

## 2020-08-11 NOTE — Telephone Encounter (Signed)
Palliative Medicine RN Note: I rec'd a call from patient Ana Washington. She asked about when she would hear from home-based palliative, as well as her PT/OT.  I spoke w Bevely Palmer, [facility] liaison, to let her know patient is looking to hear from them.  I called [facility] [phone]) and spoke w Estill Bamberg. She spoke with the scheduler for [facility] team and will have her called today.  Marjie Skiff Arnetia Bronk, RN, BSN, [facility] Palliative Medicine Team 08/11/2020 3:15 PM Office [phone]

## 2020-08-23 ENCOUNTER — Emergency Department: Payer: Medicare HMO

## 2020-08-23 ENCOUNTER — Inpatient Hospital Stay
Admission: EM | Admit: 2020-08-23 | Discharge: 2020-08-27 | DRG: 917 | Disposition: A | Discharge status: Home Health | Payer: Medicare HMO | Attending: Internal Medicine | Admitting: Internal Medicine

## 2020-08-23 DIAGNOSIS — I509 Heart failure, unspecified: Secondary | ICD-10-CM

## 2020-08-23 DIAGNOSIS — Z8249 Family history of ischemic heart disease and other diseases of the circulatory system: Secondary | ICD-10-CM

## 2020-08-23 DIAGNOSIS — J471 Bronchiectasis with (acute) exacerbation: Secondary | ICD-10-CM | POA: Diagnosis present

## 2020-08-23 DIAGNOSIS — J9611 Chronic respiratory failure with hypoxia: Secondary | ICD-10-CM | POA: Diagnosis present

## 2020-08-23 DIAGNOSIS — I5033 Acute on chronic diastolic (congestive) heart failure: Secondary | ICD-10-CM | POA: Diagnosis present

## 2020-08-23 DIAGNOSIS — E873 Alkalosis: Secondary | ICD-10-CM | POA: Diagnosis not present

## 2020-08-23 DIAGNOSIS — Z8711 Personal history of peptic ulcer disease: Secondary | ICD-10-CM

## 2020-08-23 DIAGNOSIS — R001 Bradycardia, unspecified: Secondary | ICD-10-CM | POA: Diagnosis not present

## 2020-08-23 DIAGNOSIS — Z66 Do not resuscitate: Secondary | ICD-10-CM | POA: Diagnosis present

## 2020-08-23 DIAGNOSIS — Z79891 Long term (current) use of opiate analgesic: Secondary | ICD-10-CM

## 2020-08-23 DIAGNOSIS — Z9981 Dependence on supplemental oxygen: Secondary | ICD-10-CM

## 2020-08-23 DIAGNOSIS — K219 Gastro-esophageal reflux disease without esophagitis: Secondary | ICD-10-CM | POA: Diagnosis present

## 2020-08-23 DIAGNOSIS — G8929 Other chronic pain: Secondary | ICD-10-CM | POA: Diagnosis present

## 2020-08-23 DIAGNOSIS — E871 Hypo-osmolality and hyponatremia: Secondary | ICD-10-CM

## 2020-08-23 DIAGNOSIS — Z880 Allergy status to penicillin: Secondary | ICD-10-CM

## 2020-08-23 DIAGNOSIS — Z882 Allergy status to sulfonamides status: Secondary | ICD-10-CM

## 2020-08-23 DIAGNOSIS — I110 Hypertensive heart disease with heart failure: Secondary | ICD-10-CM | POA: Diagnosis present

## 2020-08-23 DIAGNOSIS — F32A Depression, unspecified: Secondary | ICD-10-CM | POA: Diagnosis present

## 2020-08-23 DIAGNOSIS — L409 Psoriasis, unspecified: Secondary | ICD-10-CM | POA: Diagnosis present

## 2020-08-23 DIAGNOSIS — T402X1A Poisoning by other opioids, accidental (unintentional), initial encounter: Principal | ICD-10-CM | POA: Diagnosis present

## 2020-08-23 DIAGNOSIS — I2721 Secondary pulmonary arterial hypertension: Secondary | ICD-10-CM | POA: Diagnosis present

## 2020-08-23 DIAGNOSIS — G43909 Migraine, unspecified, not intractable, without status migrainosus: Secondary | ICD-10-CM | POA: Diagnosis present

## 2020-08-23 DIAGNOSIS — J9620 Acute and chronic respiratory failure, unspecified whether with hypoxia or hypercapnia: Secondary | ICD-10-CM

## 2020-08-23 DIAGNOSIS — T50901A Poisoning by unspecified drugs, medicaments and biological substances, accidental (unintentional), initial encounter: Secondary | ICD-10-CM | POA: Diagnosis not present

## 2020-08-23 DIAGNOSIS — I482 Chronic atrial fibrillation, unspecified: Secondary | ICD-10-CM | POA: Diagnosis present

## 2020-08-23 DIAGNOSIS — J9621 Acute and chronic respiratory failure with hypoxia: Secondary | ICD-10-CM | POA: Diagnosis present

## 2020-08-23 DIAGNOSIS — R4182 Altered mental status, unspecified: Secondary | ICD-10-CM | POA: Diagnosis present

## 2020-08-23 DIAGNOSIS — Z8616 Personal history of COVID-19: Secondary | ICD-10-CM

## 2020-08-23 DIAGNOSIS — Z885 Allergy status to narcotic agent status: Secondary | ICD-10-CM

## 2020-08-23 DIAGNOSIS — I2510 Atherosclerotic heart disease of native coronary artery without angina pectoris: Secondary | ICD-10-CM | POA: Diagnosis present

## 2020-08-23 DIAGNOSIS — Z79899 Other long term (current) drug therapy: Secondary | ICD-10-CM

## 2020-08-23 DIAGNOSIS — Z7901 Long term (current) use of anticoagulants: Secondary | ICD-10-CM

## 2020-08-23 DIAGNOSIS — I480 Paroxysmal atrial fibrillation: Secondary | ICD-10-CM | POA: Diagnosis present

## 2020-08-23 DIAGNOSIS — U071 COVID-19: Secondary | ICD-10-CM | POA: Diagnosis present

## 2020-08-23 DIAGNOSIS — I252 Old myocardial infarction: Secondary | ICD-10-CM

## 2020-08-23 DIAGNOSIS — M1990 Unspecified osteoarthritis, unspecified site: Secondary | ICD-10-CM | POA: Diagnosis present

## 2020-08-23 DIAGNOSIS — I10 Essential (primary) hypertension: Secondary | ICD-10-CM | POA: Diagnosis present

## 2020-08-23 DIAGNOSIS — I700 Atherosclerosis of aorta: Secondary | ICD-10-CM | POA: Diagnosis present

## 2020-08-23 DIAGNOSIS — I428 Other cardiomyopathies: Secondary | ICD-10-CM

## 2020-08-23 LAB — CBC WITH DIFFERENTIAL/PLATELET
Abs Immature Granulocytes: 0.37 10*3/uL — ABNORMAL HIGH (ref 0.00–0.07)
Basophils Absolute: 0.1 10*3/uL (ref 0.0–0.1)
Basophils Relative: 0 %
Eosinophils Absolute: 0 10*3/uL (ref 0.0–0.5)
Eosinophils Relative: 0 %
HCT: 33.9 % — ABNORMAL LOW (ref 36.0–46.0)
Hemoglobin: 11.6 g/dL — ABNORMAL LOW (ref 12.0–15.0)
Immature Granulocytes: 2 %
Lymphocytes Relative: 3 %
Lymphs Abs: 0.6 10*3/uL — ABNORMAL LOW (ref 0.7–4.0)
MCH: 34.5 pg — ABNORMAL HIGH (ref 26.0–34.0)
MCHC: 34.2 g/dL (ref 30.0–36.0)
MCV: 100.9 fL — ABNORMAL HIGH (ref 80.0–100.0)
Monocytes Absolute: 0.6 10*3/uL (ref 0.1–1.0)
Monocytes Relative: 3 %
Neutro Abs: 20.1 10*3/uL — ABNORMAL HIGH (ref 1.7–7.7)
Neutrophils Relative %: 92 %
Platelets: 282 10*3/uL (ref 150–400)
RBC: 3.36 MIL/uL — ABNORMAL LOW (ref 3.87–5.11)
RDW: 13 % (ref 11.5–15.5)
WBC: 21.8 10*3/uL — ABNORMAL HIGH (ref 4.0–10.5)
nRBC: 0 % (ref 0.0–0.2)

## 2020-08-23 LAB — BASIC METABOLIC PANEL
Anion gap: 7 (ref 5–15)
BUN: 11 mg/dL (ref 8–23)
CO2: 34 mmol/L — ABNORMAL HIGH (ref 22–32)
Calcium: 8.1 mg/dL — ABNORMAL LOW (ref 8.9–10.3)
Chloride: 82 mmol/L — ABNORMAL LOW (ref 98–111)
Creatinine, Ser: 0.55 mg/dL (ref 0.44–1.00)
GFR, Estimated: >60 mL/min (ref >60)
Glucose, Bld: 271 mg/dL — ABNORMAL HIGH (ref 70–99)
Potassium: 4.8 mmol/L (ref 3.5–5.1)
Sodium: 123 mmol/L — ABNORMAL LOW (ref 135–145)

## 2020-08-23 LAB — BRAIN NATRIURETIC PEPTIDE: B Natriuretic Peptide: 1408.1 pg/mL — ABNORMAL HIGH (ref 0.0–100.0)

## 2020-08-23 LAB — DIGOXIN LEVEL: Digoxin Level: 0.3 ng/mL — ABNORMAL LOW (ref 0.8–2.0)

## 2020-08-23 LAB — TROPONIN I (HIGH SENSITIVITY): Troponin I (High Sensitivity): 50 ng/L — ABNORMAL HIGH (ref <18)

## 2020-08-23 MED ORDER — ONDANSETRON HCL 4 MG/2ML IJ SOLN: 4.0000 mg | Freq: Once | INTRAMUSCULAR | Status: AC

## 2020-08-23 MED ORDER — IPRATROPIUM-ALBUTEROL 0.5-2.5 (3) MG/3ML IN SOLN
3.0000 mL | Freq: Once | RESPIRATORY_TRACT | Status: AC
  Administered 2020-08-23: 3 mL via RESPIRATORY_TRACT
  Filled 2020-08-23: qty 9

## 2020-08-23 MED ORDER — ONDANSETRON HCL 4 MG/2ML IJ SOLN
INTRAMUSCULAR | Status: AC
  Administered 2020-08-23: 4 mg via INTRAVENOUS
  Filled 2020-08-23: qty 2

## 2020-08-23 MED ORDER — FUROSEMIDE 10 MG/ML IJ SOLN
40.0000 mg | Freq: Once | INTRAMUSCULAR | Status: AC
  Administered 2020-08-23: 40 mg via INTRAVENOUS
  Filled 2020-08-23: qty 4

## 2020-08-23 NOTE — ED Provider Notes (Signed)
[facility] Emergency Department Provider Note  ____________________________________________   Event Date/Time   First MD Initiated Contact with Patient 08/23/20 2003     (approximate)  I have reviewed the triage vital signs and the nursing notes.   HISTORY  Chief Complaint Respiratory Distress    HPI Ana Washington is a 75 y.o. female  with PMHx below including bronchiectasis, AFib, CAD, HTN, here with AMS. Per report, pt has c/o increasing headache throughout the day. H/o headaches but this has been more severe. She has also c/o increasing SOB, so asked family if she could get morphine tonight (had been on hospice so had this in the home). Family accidentally gave morphine 10 mg PO. She shortly thereafter became less responsive, drowsy. EMS called, gave pt narcan 2 mg with improvement in mental status. Pt states she overall feels well now - denies any ongoing headache. She has some mild nausea that is new. Reports she has been mildly SOB for a few days but not severe. She has not noticed any increased swelling.       Past Medical History:  Diagnosis Date   Arthritis    Atrial fibrillation ([place])    Bronchiectasis ([place])    CAD (coronary artery disease) 07/30/2017   Dumping syndrome    Essential hypertension, malignant 10/03/2013   Family history of adverse reaction to anesthesia    sister [name]   GERD (gastroesophageal reflux disease)    Headache    MIGRAINES   Myocardial infarction ([place]) 2007   Non-STEMI   [name] (postoperative nausea and vomiting)    Psoriasis    PUD (peptic ulcer disease)     Patient Active Problem List   Diagnosis Date Noted   Chronic hyponatremia 08/23/2020   Acute on chronic diastolic CHF (congestive heart failure) ([place]) 08/23/2020   Morphine Overdose, accidental or unintentional, initial encounter 08/23/2020   Bronchiectasis with acute exacerbation ([place]) 07/31/2020   CAP (community acquired pneumonia) 07/30/2020   Acute  exacerbation of bronchiectasis ([place]) 07/14/2020   COVID-19 virus infection 07/14/2020   Lactic acidosis 07/14/2020   SIRS (systemic inflammatory response syndrome) ([place]) 07/14/2020   Elevated troponin level not due myocardial infarction 07/14/2020   Anxiety    Constipation    Pressure injury of right heel, stage 1    Bronchiectasis with (acute) exacerbation ([place]) 04/29/2020   Hemoptysis    Chronic respiratory failure with hypoxia ([place])    AKI (acute kidney injury) ([place])    Bronchiectasis ([place]) 09/08/2019   Atrial fibrillation, chronic ([place]) 09/08/2019   Closed right hip fracture (HCC) 09/27/2018   Intractable nausea and vomiting 04/08/2018   Atrial fibrillation with RVR ([place]) 04/08/2018   Thoracic radiculitis (Bilateral) 03/29/2018   Vasovagal episode 03/29/2018   Closed compression fracture of T10 thoracic vertebra, sequela 03/29/2018   Abnormal MRI, lumbar spine (12/15/2016) 03/21/2018   Lumbar compression fractures, sequela (L1, L2, L3, L4, and L5) 03/21/2018   Closed compression fracture of L1 lumbar vertebra, sequela 03/21/2018   Closed compression fracture of L2 lumbar vertebra, sequela 03/21/2018   Closed compression fracture of L3 lumbar vertebra, sequela 03/21/2018   Closed compression fracture of L4 lumbar vertebra, sequela 03/21/2018   Closed compression fracture of L5 lumbar vertebra, sequela 03/21/2018   Thoracic compression fracture, sequela (T5, T9, T10, T11, and T12) 03/21/2018   Closed compression fracture of T5 thoracic vertebra, sequela 03/21/2018   Closed compression fracture of T9 thoracic vertebra, sequela 03/21/2018   Close compression fracture of T11 thoracic  vertebra, sequela 03/21/2018   Closed compression fracture of T12 thoracic vertebra, sequela 03/21/2018   Lumbar facet hypertrophy 03/21/2018   Grade 1  Lumbar Anterolisthesis of L3/4 and L4/5 03/21/2018   Lumbar central spinal stenosis (Multilevel), 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 claudication 03/21/2018   Chronic  anticoagulation (ELIQUIS) 03/21/2018   History of pelvic fracture 03/21/2018   Chronic musculoskeletal pain 03/21/2018   Neurogenic pain 03/21/2018   Long term prescription benzodiazepine use 03/21/2018   DDD (degenerative disc disease), 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   Adult bronchiectasis ([place]) 03/01/2018   Diverticulitis 03/01/2018   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[place]) 03/01/2018   Migraines 03/01/2018   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   Osteoporosis, 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   Psoriasis 03/01/2018   Chronic upper back pain (Primary Area of Pain) (Bilateral) (R>L) 03/01/2018   Chronic low back pain (Secondary Area of Pain) (Bilateral) (R>L) w/o sciatica 03/01/2018   Chronic pain syndrome 03/01/2018   Long term current use of opiate analgesic 03/01/2018   Pharmacologic therapy 03/01/2018   Disorder of skeletal system 03/01/2018   Problems influencing health status 03/01/2018   History of kyphoplasty (L1, L2, L3, T9, T11, and T12) 01/05/2018   Malnutrition of moderate degree ([place]) 08/03/2017   GERD (gastroesophageal reflux disease) 07/30/2017   Pelvic fracture ([place]) 07/30/2017   AF (paroxysmal atrial fibrillation) ([place]) 07/30/2017   Other dysphagia 09/13/2016   Unintended weight loss 09/13/2016   Essential hypertension 10/03/2013   Osteoarthritis of knees (Bilateral) 10/03/2013   Primary localized osteoarthrosis, lower leg 10/03/2013   Non-ischemic cardiomyopathy ([place]) 09/12/2013   Coronary artery disease involving native coronary artery of native heart without angina pectoris 07/28/2012   Hyperlipidemia, mixed 07/28/2012   DDD (degenerative disc disease), lumbar 02/29/2012    Past Surgical History:  Procedure Laterality Date   BACK SURGERY     CHOLECYSTECTOMY     ESOPHAGOGASTRODUODENOSCOPY (EGD) WITH PROPOFOL N/A 03/19/2019   Procedure: ESOPHAGOGASTRODUODENOSCOPY (EGD) WITH PROPOFOL;  Surgeon: [name], MD;  Location: [facility] ENDOSCOPY;  Service: Gastroenterology;   Laterality: N/A;  *Note to anesthesia: Per pt's pulmonologist, if intubating, please extubate to BIPAP.   EYE SURGERY     FOOT SURGERY     INTRAMEDULLARY (IM) NAIL INTERTROCHANTERIC Right 09/30/2018   Procedure: INTRAMEDULLARY (IM) NAIL INTERTROCHANTRIC;  Surgeon: Dereck Leep, MD;  Location: [facility] ORS;  Service: Orthopedics;  Laterality: Right;   KYPHOPLASTY N/A 07/05/2016   Procedure: KYPHOPLASTY T - 9;  Surgeon: Hessie Knows, MD;  Location: [facility] ORS;  Service: Orthopedics;  Laterality: N/A;   KYPHOPLASTY N/A 11/29/2017   Procedure: Iona Hansen;  Surgeon: Hessie Knows, MD;  Location: [facility] ORS;  Service: Orthopedics;  Laterality: N/A;  L2 and L3   KYPHOPLASTY N/A 12/18/2017   Procedure: KYPHOPLASTY L1;  Surgeon: Hessie Knows, MD;  Location: [facility] ORS;  Service: Orthopedics;  Laterality: N/A;   KYPHOPLASTY N/A 01/05/2018   Procedure: KYPHOPLASTY-T11,T12;  Surgeon: Hessie Knows, MD;  Location: [facility] ORS;  Service: Orthopedics;  Laterality: N/A;   KYPHOPLASTY N/A 04/05/2018   Procedure: T10 KYPHOPLASTY;  Surgeon: Hessie Knows, MD;  Location: [facility] ORS;  Service: Orthopedics;  Laterality: N/A;   KYPHOPLASTY N/A 04/12/2018   Procedure: KYPHOPLASTY T7,8;  Surgeon: Hessie Knows, MD;  Location: [facility] ORS;  Service: Orthopedics;  Laterality: N/A;   KYPHOPLASTY N/A 04/19/2018   Procedure: KYPHOPLASTY T5, T6;  Surgeon: Hessie Knows, MD;  Location: [facility] ORS;  Service: Orthopedics;  Laterality: N/A;   LUNG SURGERY  1990 and [place] /  RIGHT MIDDLE LOBECTOMY    Prior to Admission medications   Medication Sig Start Date End Date Taking? Authorizing Provider  acetaminophen (TYLENOL) 500 MG tablet Take 1,000 mg by mouth every 6 (six) hours as needed for mild pain or headache.     [provider]  acetylcysteine (MUCOMYST) 20 % nebulizer solution Take 4 mLs by nebulization daily. 04/22/20   [provider]  amiodarone (PACERONE) 200  MG tablet Take 1 tablet (200 mg total) by mouth 2 (two) times daily. 08/07/20 09/06/20  Val Riles, MD  apixaban (ELIQUIS) 5 MG TABS tablet Take 5 mg by mouth 2 (two) times daily.     [provider]  Ascorbic Acid (VITAMIN C) 1000 MG tablet Take 1,000 mg by mouth daily.    [provider]  benzonatate (TESSALON) 200 MG capsule Take 200 mg by mouth 3 (three) times daily as needed. 04/02/20   [provider]  COMBIVENT RESPIMAT 20-100 MCG/ACT AERS respimat 1 puff every 6 (six) hours as needed for wheezing. 06/09/20   [provider]  digoxin (LANOXIN) 0.125 MG tablet Take 125 mcg by mouth daily. 05/11/20   [provider]  ferrous sulfate 325 (65 FE) MG EC tablet Take 325 mg by mouth daily.     [provider]  gabapentin (NEURONTIN) 300 MG capsule Take 300 mg by mouth 4 (four) times daily.     [provider]  levalbuterol (XOPENEX) 0.31 MG/3ML nebulizer solution Inhale 3 mLs into the lungs every 6 (six) hours as needed. 12/10/19   [provider]  LORazepam (ATIVAN) 0.5 MG tablet Take 1 tablet (0.5 mg total) by mouth at bedtime. 09/10/19   Enzo Bi, MD  metoprolol tartrate (LOPRESSOR) 25 MG tablet Take 1 tablet (25 mg total) by mouth 2 (two) times daily. 08/07/20 09/06/20  Val Riles, MD  NAC 600 MG CAPS Take 600 mg by mouth daily after breakfast.  08/17/19   [provider]  omeprazole (PRILOSEC) 20 MG capsule Take 20 mg by mouth daily. 08/26/19   [provider]  polyethylene glycol (MIRALAX / GLYCOLAX) 17 g packet Take 17 g by mouth daily as needed for severe constipation. 05/02/20   Loletha Grayer, MD  predniSONE (STERAPRED UNI-PAK 21 TAB) 10 MG (21) TBPK tablet 40 mg p.o. daily x 2 days, 30 mg p.o. daily x2 days, 20 mg p.o. daily x 2 days, 10 mg p.o. daily x3 days 08/07/20   Val Riles, MD  Probiotic Product (ALIGN) 4 MG CAPS Take 4 mg by mouth daily.    [provider]  senna (SENOKOT) 8.6 MG TABS tablet  Take 2 tablets (17.2 mg total) by mouth at bedtime. 05/02/20   Loletha Grayer, MD    Allergies Codeine, Sulfa antibiotics, and Penicillins  Family History  Problem Relation Age of Onset   Hypertension Mother    Hypertension Father     Social History Social History   Tobacco Use   Smoking status: Never   Smokeless tobacco: Never  Vaping Use   Vaping Use: Never used  Substance Use Topics   Alcohol use: No   Drug use: No    Review of Systems  Review of Systems  Constitutional:  Positive for fatigue. Negative for fever.  HENT:  Negative for congestion and sore throat.   Eyes:  Negative for visual disturbance.  Respiratory:  Positive for shortness of breath. Negative for cough.   Cardiovascular:  Negative for chest pain.  Gastrointestinal:  Positive for nausea.  Negative for abdominal pain, diarrhea and vomiting.  Genitourinary:  Negative for flank pain.  Musculoskeletal:  Negative for back pain and neck pain.  Skin:  Negative for rash and wound.  Neurological:  Positive for headaches. Negative for weakness.    ____________________________________________  PHYSICAL EXAM:      VITAL SIGNS: ED Triage Vitals  Enc Vitals Group     BP      Pulse      Resp      Temp      Temp src      SpO2      Weight      Height      Head Circumference      [place] Flow      Pain Score      Pain Loc      Pain Edu?      Excl. in [place]?      Physical Exam Vitals and nursing note reviewed.  Constitutional:      General: She is not in acute distress.    Appearance: She is well-developed.  HENT:     Head: Normocephalic and atraumatic.  Eyes:     Conjunctiva/sclera: Conjunctivae normal.  Cardiovascular:     Rate and Rhythm: Normal rate and regular rhythm.     Heart sounds: Normal heart sounds. No murmur heard.   No friction rub.  Pulmonary:     Effort: Pulmonary effort is normal. Tachypnea present. No respiratory distress.     Breath sounds: Decreased air movement present.  Examination of the right-lower field reveals rales. Examination of the left-lower field reveals rales. Wheezing, rhonchi and rales present.  Abdominal:     General: There is no distension.     Palpations: Abdomen is soft.     Tenderness: There is no abdominal tenderness.  Musculoskeletal:     Cervical back: Neck supple.  Skin:    General: Skin is warm.     Capillary Refill: Capillary refill takes less than 2 seconds.  Neurological:     Mental Status: She is alert and oriented to person, place, and time.     Motor: No abnormal muscle tone.      ____________________________________________   LABS (all labs ordered are listed, but only abnormal results are displayed)  Labs Reviewed  CBC WITH DIFFERENTIAL/PLATELET - Abnormal; Notable for the following components:      Result Value   WBC 21.8 (*)    RBC 3.36 (*)    Hemoglobin 11.6 (*)    HCT 33.9 (*)    MCV 100.9 (*)    MCH 34.5 (*)    Neutro Abs 20.1 (*)    Lymphs Abs 0.6 (*)    Abs Immature Granulocytes 0.37 (*)    All other components within normal limits  BASIC METABOLIC PANEL - Abnormal; Notable for the following components:   Sodium 123 (*)    Chloride 82 (*)    CO2 34 (*)    Glucose, Bld 271 (*)    Calcium 8.1 (*)    All other components within normal limits  BRAIN NATRIURETIC PEPTIDE - Abnormal; Notable for the following components:   B Natriuretic Peptide 1,408.1 (*)    All other components within normal limits  DIGOXIN LEVEL - Abnormal; Notable for the following components:   Digoxin Level 0.3 (*)    All other components within normal limits  TROPONIN I (HIGH SENSITIVITY) - Abnormal; Notable for the following components:   Troponin I (High Sensitivity) 50 (*)  All other components within normal limits    ____________________________________________  EKG: Normal sinus rhythm, VR 82. PR 234, QRS 119, QTc 420. No acute ST elevations or depressions. No ischemia or  infarct. ________________________________________  RADIOLOGY All imaging, including plain films, CT scans, and ultrasounds, independently reviewed by me, and interpretations confirmed via formal radiology reads.  ED MD interpretation:   CT Head: NAICA CXR: bronchiectasis    Official radiology report(s): CT Head Wo Contrast  Result Date: 08/23/2020 CLINICAL DATA:  Headache.  Suspect intracranial hemorrhage. EXAM: CT HEAD WITHOUT CONTRAST TECHNIQUE: Contiguous axial images were obtained from the base of the skull through the vertex without intravenous contrast. COMPARISON:  None. FINDINGS: Brain: No evidence of acute infarction, hemorrhage, hydrocephalus, extra-axial collection or mass lesion/mass effect. Mild diffuse cerebral atrophy. Low-attenuation changes in the deep white matter consistent with small vessel ischemia. Vascular: Moderate intracranial arterial vascular calcifications. Skull: Calvarium appears intact. Sinuses/Orbits: Small retention cyst in the right maxillary antrum. 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 Paranasal sinuses and mastoid air cells are otherwise clear. Other: None. IMPRESSION: No acute intracranial abnormalities. Chronic atrophy and small vessel ischemic changes. Electronically Signed   By: Lucienne Capers M.D.   On: 08/23/2020 21:59   DG Chest Port 1 View  Result Date: 08/23/2020 CLINICAL DATA:  Short of breath. EXAM: PORTABLE CHEST 1 VIEW COMPARISON:  08/02/2020 and older exams.  CT, 07/30/2020. FINDINGS: Cardiac silhouette is mostly obscured. There is bi basilar opacity consistent with small effusions. Post lung surgery changes are noted with left perihilar pulmonary anastomosis staples. There are also pulmonary anastomosis staples in the right upper lung extending from the right hilum. Lungs demonstrate prominent vascular and thickened interstitial markings similar to the prior exam. Additional opacity is noted at the left  lung base consistent with atelectasis. No pneumothorax. No mediastinal or hilar masses. Numerous vertebral fractures have been treated with vertebroplasty/kyphoplasty, unchanged. IMPRESSION: 1. Findings are similar to the most recent prior chest radiograph. No convincing pneumonia or pulmonary edema. 2. Bilateral lung base opacities consistent with small effusions, greater on the left. Left greater than right basilar atelectasis. 3. Significant chronic interstitial lung disease. Stable changes from prior bilateral lung surgery. Electronically Signed   By: Lajean Manes M.D.   On: 08/23/2020 20:57    ____________________________________________  PROCEDURES   Procedure(s) performed (including Critical Care):  Procedures  ____________________________________________  INITIAL IMPRESSION / MDM / [place] / ED COURSE  As part of my medical decision making, I reviewed the following data within the [place] notes reviewed and incorporated, Old chart reviewed, Notes from prior ED visits, and [place] Controlled Substance Database       *SAJA BALAGUER was evaluated in Emergency Department on 08/24/2020 for the symptoms described in the history of present illness. She was evaluated in the context of the global COVID-19 pandemic, which necessitated consideration that the patient might be at risk for infection with the SARS-CoV-2 virus that causes COVID-19. Institutional protocols and algorithms that pertain to the evaluation of patients at risk for COVID-19 are in a state of rapid change based on information released by regulatory bodies including the CDC and federal and state organizations. These policies and algorithms were followed during the patient's care in the ED.  Some ED evaluations and interventions may be delayed as a result of limited staffing during the pandemic.*     Medical Decision Making:  75 yo F here with SOB, headache, transient AMS  improved w/ narcan.  Re: her AMS, suspect opioid narcosis related to 10 mg dose of morphine. Improved with narcan and she is now awake, alert. C/o mild nausea likely related to this - zofran given. Re: her HA, she has had some increasing SOB, HA, and HTN over past several days. She was told to watch her weight and as such, has not been taking lasix as often. Labs reviewed, remarkable for significantly elevated BNP above baseline as well as mild troponin elevation.  EKG is nonischemic.  Chest x-ray shows no acute changes from her baseline.  CT head reviewed, shows no acute abnormality.  BMP with acute on chronic hyponatremia, and I suspect clinically patient has mild CHF exacerbation in the setting of decreasing her Lasix leading to hypertension and possible hypertension related headache.  Will admit for Lasix, trending of troponins, and further monitoring.  ____________________________________________  FINAL CLINICAL IMPRESSION(S) / ED DIAGNOSES  Final diagnoses:  Acute on chronic respiratory failure after trauma ([place])  Opioid overdose, accidental or unintentional, initial encounter ([place])     MEDICATIONS GIVEN DURING THIS VISIT:  Medications  ipratropium-albuterol (DUONEB) 0.5-2.5 (3) MG/3ML nebulizer solution 3 mL (3 mLs Nebulization Given 08/23/20 2237)  furosemide (LASIX) injection 40 mg (40 mg Intravenous Given 08/23/20 2238)  ondansetron (ZOFRAN) injection 4 mg (4 mg Intravenous Given 08/23/20 2237)     ED Discharge Orders     None        Note:  This document was prepared using Dragon voice recognition software and may include unintentional dictation errors.   Duffy Bruce, MD 08/24/20 [phone]

## 2020-08-23 NOTE — H&P (Signed)
History and Physical    STEVEN VEAZIE WGY:[id number] DOB: 1945/05/16 DOA: 08/23/2020  PCP: [name], MD   Patient coming from: home  I have personally briefly reviewed patient's old medical records in [place]  Chief Complaint: shortness of breath  HPI: Ana Washington is a 75 y.o. female with medical history significant for , nonischemic cardiomyopathy, diastolic CHF, last EF 50 to 55% 03/2020, Takotsubo cardiomyopathy, HTN, paroxysmal A. 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 fib, who presents with an unintentional overdose of morphine, reversed with Narcan by EMS.  Patient reports that she had been having a headache that was severe and felt worsening shortness of breath and asked that her sister administer some leftover morphine that she had.  She became obtunded after administration of morphine and EMS was called out.  She returned to normal mentation following a dose of Narcan but was subsequently brought to the ED.  She continued to have shortness of breath but denied chest pain.  Cough is at baseline.  Had no fever or chills.  Denied nausea, vomiting, abdominal pain or diarrhea.  ED course: On arrival awake and alert.  Temperature 97.5, pulse 77, BP 170/104, respirations 20 with O2 sat 99% on 3 L Blood work significant for leukocytosis of 21,000 with hemoglobin 11.6.  Sodium 123, glucose 271, troponin 50 with BNP 1408 dig level 0.3  EKG, personally viewed and interpreted: Sinus at 82 with nonspecific ST-T wave changes  Imaging: Chest x-ray with similar findings to prior chest x-ray from 7/3.  No convincing pneumonia or pulmonary edema.  Significant chronic interstitial lung disease CT head no acute intracranial abnormalities  Patient treated with a dose of Lasix and DuoNeb.  Hospitalist consulted for  admission.    Review of Systems: As per HPI otherwise all other systems on review of systems negative.    Past Medical History:  Diagnosis Date   Arthritis    Atrial fibrillation ([place])    Bronchiectasis (HCC)    CAD (coronary artery disease) 07/30/2017   Dumping syndrome    Essential hypertension, malignant 10/03/2013   Family history of adverse reaction to anesthesia    sister [name]   GERD (gastroesophageal reflux disease)    Headache    MIGRAINES   Myocardial infarction ([place]) 2007   Non-STEMI   [name] (postoperative nausea and vomiting)    Psoriasis    PUD (peptic ulcer disease)     Past Surgical History:  Procedure Laterality Date   BACK SURGERY     CHOLECYSTECTOMY     ESOPHAGOGASTRODUODENOSCOPY (EGD) WITH PROPOFOL N/A 03/19/2019   Procedure: ESOPHAGOGASTRODUODENOSCOPY (EGD) WITH PROPOFOL;  Surgeon: [name], MD;  Location: [facility] ENDOSCOPY;  Service: Gastroenterology;  Laterality: N/A;  *Note to anesthesia: Per pt's pulmonologist, if intubating, please extubate to BIPAP.   EYE SURGERY     FOOT SURGERY     INTRAMEDULLARY (IM) NAIL INTERTROCHANTERIC Right 09/30/2018   Procedure: INTRAMEDULLARY (IM) NAIL INTERTROCHANTRIC;  Surgeon: Dereck Leep, MD;  Location: [facility] ORS;  Service: Orthopedics;  Laterality: Right;   KYPHOPLASTY N/A 07/05/2016   Procedure: KYPHOPLASTY T - 9;  Surgeon: Hessie Knows, MD;  Location: [facility] ORS;  Service: Orthopedics;  Laterality: N/A;   KYPHOPLASTY N/A 11/29/2017   Procedure: Iona Hansen;  Surgeon: Hessie Knows, MD;  Location: [facility] ORS;  Service: Orthopedics;  Laterality: N/A;  L2 and  L3   KYPHOPLASTY N/A 12/18/2017   Procedure: KYPHOPLASTY L1;  Surgeon: Hessie Knows, MD;  Location: [facility] ORS;  Service: Orthopedics;  Laterality: N/A;   KYPHOPLASTY N/A 01/05/2018   Procedure: KYPHOPLASTY-T11,T12;  Surgeon: Hessie Knows, MD;  Location: [facility] ORS;  Service: Orthopedics;  Laterality: N/A;   KYPHOPLASTY N/A 04/05/2018   Procedure: T10 KYPHOPLASTY;   Surgeon: Hessie Knows, MD;  Location: [facility] ORS;  Service: Orthopedics;  Laterality: N/A;   KYPHOPLASTY N/A 04/12/2018   Procedure: KYPHOPLASTY T7,8;  Surgeon: Hessie Knows, MD;  Location: [facility] ORS;  Service: Orthopedics;  Laterality: N/A;   KYPHOPLASTY N/A 04/19/2018   Procedure: KYPHOPLASTY T5, T6;  Surgeon: Hessie Knows, MD;  Location: [facility] ORS;  Service: Orthopedics;  Laterality: N/A;   LUNG SURGERY  1990 and 1996   THOROCOTOMY WITH LOBECTOMY     LEFT LOWER THORACOTOMY / RIGHT MIDDLE LOBECTOMY     reports that she has never smoked. She has never used smokeless tobacco. She reports that she does not drink alcohol and does not use drugs.  Allergies  Allergen Reactions   Codeine Nausea And Vomiting   Sulfa Antibiotics Diarrhea   Penicillins Rash    Has patient had a PCN reaction causing immediate rash, facial/tongue/throat swelling, SOB or lightheadedness with hypotension: Unknown Has patient had a PCN reaction causing severe rash involving mucus membranes or skin necrosis: Unknown Has patient had a PCN reaction that required hospitalization: Unknown Has patient had a PCN reaction occurring within the last 10 years: No If all of the above answers are "NO", then may proceed with Cephalosporin use.     Family History  Problem Relation Age of Onset   Hypertension Mother    Hypertension Father       Prior to Admission medications   Medication Sig Start Date End Date Taking? Authorizing Provider  acetaminophen (TYLENOL) 500 MG tablet Take 1,000 mg by mouth every 6 (six) hours as needed for mild pain or headache.     [provider]  acetylcysteine (MUCOMYST) 20 % nebulizer solution Take 4 mLs by nebulization daily. 04/22/20   [provider]  amiodarone (PACERONE) 200 MG tablet Take 1 tablet (200 mg total) by mouth 2 (two) times daily. 08/07/20 09/06/20  Val Riles, MD  apixaban (ELIQUIS) 5 MG TABS tablet Take 5 mg by mouth 2 (two) times daily.     [provider]  Ascorbic Acid (VITAMIN C) 1000 MG tablet Take 1,000 mg by mouth daily.    [provider]  benzonatate (TESSALON) 200 MG capsule Take 200 mg by mouth 3 (three) times daily as needed. 04/02/20   [provider]  COMBIVENT RESPIMAT 20-100 MCG/ACT AERS respimat 1 puff every 6 (six) hours as needed for wheezing. 06/09/20   [provider]  digoxin (LANOXIN) 0.125 MG tablet Take 125 mcg by mouth daily. 05/11/20   [provider]  ferrous sulfate 325 (65 FE) MG EC tablet Take 325 mg by mouth daily.     [provider]  gabapentin (NEURONTIN) 300 MG capsule Take 300 mg by mouth 4 (four) times daily.     [provider]  levalbuterol (XOPENEX) 0.31 MG/3ML nebulizer solution Inhale 3 mLs into the lungs every 6 (six) hours as needed. 12/10/19   [provider]  LORazepam (ATIVAN) 0.5 MG tablet Take 1 tablet (0.5 mg total) by mouth at bedtime. 09/10/19   Enzo Bi, MD  metoprolol tartrate (LOPRESSOR) 25 MG tablet Take 1 tablet (25 mg total) by  mouth 2 (two) times daily. 08/07/20 09/06/20  Val Riles, MD  NAC 600 MG CAPS Take 600 mg by mouth daily after breakfast.  08/17/19   [provider]  omeprazole (PRILOSEC) 20 MG capsule Take 20 mg by mouth daily. 08/26/19   [provider]  polyethylene glycol (MIRALAX / GLYCOLAX) 17 g packet Take 17 g by mouth daily as needed for severe constipation. 05/02/20   Loletha Grayer, MD  predniSONE (STERAPRED UNI-PAK 21 TAB) 10 MG (21) TBPK tablet 40 mg p.o. daily x 2 days, 30 mg p.o. daily x2 days, 20 mg p.o. daily x 2 days, 10 mg p.o. daily x3 days 08/07/20   Val Riles, MD  Probiotic Product (ALIGN) 4 MG CAPS Take 4 mg by mouth daily.    [provider]  senna (SENOKOT) 8.6 MG TABS tablet Take 2 tablets (17.2 mg total) by mouth at bedtime. 05/02/20   Loletha Grayer, MD    Physical Exam: Vitals:   08/23/20 2100 08/23/20 2130 08/23/20 2200 08/23/20 2230  BP: 126/78 109/68  131/80 (!) 150/92  Pulse: 67 64 64 72  Resp: _0 Temp:      TempSrc:      SpO2: 100% 100% 100% 100%  Weight:      Height:         Vitals:   08/23/20 2100 08/23/20 2130 08/23/20 2200 08/23/20 2230  BP: 126/78 109/68 131/80 (!) 150/92  Pulse: 67 64 64 72  Resp: _1 Temp:      TempSrc:      SpO2: 100% 100% 100% 100%  Weight:      Height:          Constitutional: Alert and oriented x 3 .  Mild conversational dyspnea  HEENT:      Head: Normocephalic and atraumatic.         Eyes: PERLA, EOMI, Conjunctivae are normal. Sclera is non-icteric.       Mouth/Throat: Mucous membranes are moist.       Neck: Supple with no signs of meningismus. Cardiovascular: Regular rate and rhythm. No murmurs, gallops, or rubs. 2+ symmetrical distal pulses are present . No JVD. Trace LE edema Respiratory: Respiratory effort slightly increased.faint bibasilar crackles  gastrointestinal: Soft, non tender, and non distended with positive bowel sounds.  Genitourinary: No CVA tenderness. Musculoskeletal: Nontender with normal range of motion in all extremities. No cyanosis, or erythema of extremities. Neurologic:  Face is symmetric. Moving all extremities. No gross focal neurologic deficits . Skin: Skin is warm, dry.  No rash or ulcers Psychiatric: Mood and affect are normal    Labs on Admission: I have personally reviewed following labs and imaging studies  CBC: Recent Labs  Lab 08/23/20 2013  WBC 21.8*  NEUTROABS 20.1*  HGB 11.6*  HCT 33.9*  MCV 100.9*  PLT 841   Basic Metabolic Panel: Recent Labs  Lab 08/23/20 2013  NA 123*  K 4.8  CL 82*  CO2 34*  GLUCOSE 271*  BUN 11  CREATININE 0.55  CALCIUM 8.1*   GFR: Estimated Creatinine Clearance: 47.3 mL/min (by C-G formula based on SCr of 0.55 mg/dL). Liver Function Tests: No results for input(s): AST, ALT, ALKPHOS, BILITOT, PROT, ALBUMIN in the last 168 hours. No results for input(s): LIPASE, AMYLASE in the last 168  hours. No results for input(s): AMMONIA in the last 168 hours. Coagulation Profile: No results for input(s): INR, PROTIME in the last 168 hours. Cardiac Enzymes: No results  for input(s): CKTOTAL, CKMB, CKMBINDEX, TROPONINI in the last 168 hours. BNP (last 3 results) No results for input(s): PROBNP in the last 8760 hours. HbA1C: No results for input(s): HGBA1C in the last 72 hours. CBG: No results for input(s): GLUCAP in the last 168 hours. Lipid Profile: No results for input(s): CHOL, HDL, LDLCALC, TRIG, CHOLHDL, LDLDIRECT in the last 72 hours. Thyroid Function Tests: No results for input(s): TSH, T4TOTAL, FREET4, T3FREE, THYROIDAB in the last 72 hours. Anemia Panel: No results for input(s): VITAMINB12, FOLATE, FERRITIN, TIBC, IRON, RETICCTPCT in the last 72 hours. Urine analysis:    Component Value Date/Time   COLORURINE YELLOW (A) 07/30/2020 2131   APPEARANCEUR HAZY (A) 07/30/2020 2131   LABSPEC 1.010 07/30/2020 2131   PHURINE 8.0 07/30/2020 2131   GLUCOSEU NEGATIVE 07/30/2020 2131   HGBUR NEGATIVE 07/30/2020 2131   BILIRUBINUR NEGATIVE 07/30/2020 2131   KETONESUR NEGATIVE 07/30/2020 2131   PROTEINUR NEGATIVE 07/30/2020 2131   NITRITE NEGATIVE 07/30/2020 2131   LEUKOCYTESUR NEGATIVE 07/30/2020 2131    Radiological Exams on Admission: CT Head Wo Contrast  Result Date: 08/23/2020 CLINICAL DATA:  Headache.  Suspect intracranial hemorrhage. EXAM: CT HEAD WITHOUT CONTRAST TECHNIQUE: Contiguous axial images were obtained from the base of the skull through the vertex without intravenous contrast. COMPARISON:  None. FINDINGS: Brain: No evidence of acute infarction, hemorrhage, hydrocephalus, extra-axial collection or mass lesion/mass effect. Mild diffuse cerebral atrophy. Low-attenuation changes in the deep white matter consistent with small vessel ischemia. Vascular: Moderate intracranial arterial vascular calcifications. Skull: Calvarium appears intact. Sinuses/Orbits: Small  retention cyst in the right maxillary antrum. 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 Paranasal sinuses and mastoid air cells are otherwise clear. Other: None. IMPRESSION: No acute intracranial abnormalities. Chronic atrophy and small vessel ischemic changes. Electronically Signed   By: Lucienne Capers M.D.   On: 08/23/2020 21:59   DG Chest Port 1 View  Result Date: 08/23/2020 CLINICAL DATA:  Short of breath. EXAM: PORTABLE CHEST 1 VIEW COMPARISON:  08/02/2020 and older exams.  CT, 07/30/2020. FINDINGS: Cardiac silhouette is mostly obscured. There is bi basilar opacity consistent with small effusions. Post lung surgery changes are noted with left perihilar pulmonary anastomosis staples. There are also pulmonary anastomosis staples in the right upper lung extending from the right hilum. Lungs demonstrate prominent vascular and thickened interstitial markings similar to the prior exam. Additional opacity is noted at the left lung base consistent with atelectasis. No pneumothorax. No mediastinal or hilar masses. Numerous vertebral fractures have been treated with vertebroplasty/kyphoplasty, unchanged. IMPRESSION: 1. Findings are similar to the most recent prior chest radiograph. No convincing pneumonia or pulmonary edema. 2. Bilateral lung base opacities consistent with small effusions, greater on the left. Left greater than right basilar atelectasis. 3. Significant chronic interstitial lung disease. Stable changes from prior bilateral lung surgery. Electronically Signed   By: Lajean Manes M.D.   On: 08/23/2020 20:57     Assessment/Plan 75 year old female with nonischemic cardiomyopathy, diastolic CHF, last EF 50 to 55% 03/2020, Takotsubo cardiomyopathy, HTN, paroxysmal A. fib on Eliquis, bronchiectasis on home O2 presenting following a morphine overdose after taking it for increased shortness of breath.  Reversed with Narcan by EMS.     Morphine Overdose, accidental or  unintentional, - Resolved with Narcan administered by EMS - Continue to monitor respiratory status - Narcan as needed  Acute dyspnea Chronic respiratory failure with hypoxia - Multifactorial related to exacerbations of bronchiectasis, diastolic heart failure and depression from morphine overdose -  No increasing oxygen needs.  Saturating in the 90s on 3 L - Treat each individual etiology as outlined above and below  Bronchiectasis with (acute) exacerbation (HCC) - DuoNebs/Xopenex every 6 and as needed - Continue acetylcysteine nebulizer solution - Continue Tessalon  Acute on chronic diastolic CHF (congestive heart failure) Nonischemic cardiomyopathy -BNP > 1400.  Baseline in the 300s.  No pulmonary edema on chest x-ray - Patient reports she held off her diuretic for a few days recently - Daily weights with intake and output monitoring - IV Lasix - Continue metoprolol and digoxin  Paroxysmal atrial fibrillation - Continue amiodarone and apixaban  Chronic pain - Continue gabapentin    Chronic hyponatremia with mild worsening - Sodium 123, baseline 129-131 - Suspect hypervolemic - Follow urine and sodium osmolality  DVT prophylaxis: Eliquis Code Status: DNR Family Communication:  none  Disposition Plan: Back to previous home environment Consults called: Cardiology Status:At the time of admission, it appears that the appropriate admission status for this patient is INPATIENT. This is judged to be reasonable and necessary in order to provide the required intensity of service to ensure the patient's safety given the presenting symptoms, physical exam findings, and initial radiographic and laboratory data in the context of their  Comorbid conditions.   Patient requires inpatient status due to high intensity of service, high risk for further deterioration and high frequency of surveillance required.   I certify that at the point of admission it is my clinical judgment that the patient  will require inpatient hospital care spanning beyond [place] MD [facility]     08/23/2020, 11:27 PM

## 2020-08-23 NOTE — ED Triage Notes (Signed)
Patient arrives from home via EMS with accidental overdose on morphine. Patient has a long history of respiratory disease and felt like she couldn't breathe tonight, asked sister to give her a dose of morphine left over from when patient was on hospice. Patient was given '10mg'$  by sister, upon arrival patient was given '2mg'$  of Narcan by EMS with marked improvement. Patient normally wears 3LPM of oxygen via [place] at home. Patient arrives awake and alert, able to answer all questions from this RN. Patient is a DNR but EMS was unable to procure DNR form prior to transporting patient to ED. Patient is no longer on hospice.

## 2020-08-24 DIAGNOSIS — Z8616 Personal history of COVID-19: Secondary | ICD-10-CM | POA: Diagnosis not present

## 2020-08-24 DIAGNOSIS — I480 Paroxysmal atrial fibrillation: Secondary | ICD-10-CM | POA: Diagnosis present

## 2020-08-24 DIAGNOSIS — I110 Hypertensive heart disease with heart failure: Secondary | ICD-10-CM | POA: Diagnosis present

## 2020-08-24 DIAGNOSIS — Z882 Allergy status to sulfonamides status: Secondary | ICD-10-CM | POA: Diagnosis not present

## 2020-08-24 DIAGNOSIS — T50901A Poisoning by unspecified drugs, medicaments and biological substances, accidental (unintentional), initial encounter: Secondary | ICD-10-CM | POA: Diagnosis not present

## 2020-08-24 DIAGNOSIS — Z7901 Long term (current) use of anticoagulants: Secondary | ICD-10-CM | POA: Diagnosis not present

## 2020-08-24 DIAGNOSIS — I700 Atherosclerosis of aorta: Secondary | ICD-10-CM | POA: Diagnosis present

## 2020-08-24 DIAGNOSIS — J471 Bronchiectasis with (acute) exacerbation: Secondary | ICD-10-CM | POA: Diagnosis present

## 2020-08-24 DIAGNOSIS — Z66 Do not resuscitate: Secondary | ICD-10-CM | POA: Diagnosis present

## 2020-08-24 DIAGNOSIS — G8929 Other chronic pain: Secondary | ICD-10-CM | POA: Diagnosis present

## 2020-08-24 DIAGNOSIS — I2510 Atherosclerotic heart disease of native coronary artery without angina pectoris: Secondary | ICD-10-CM | POA: Diagnosis present

## 2020-08-24 DIAGNOSIS — T402X1A Poisoning by other opioids, accidental (unintentional), initial encounter: Secondary | ICD-10-CM | POA: Diagnosis present

## 2020-08-24 DIAGNOSIS — K219 Gastro-esophageal reflux disease without esophagitis: Secondary | ICD-10-CM | POA: Diagnosis present

## 2020-08-24 DIAGNOSIS — I252 Old myocardial infarction: Secondary | ICD-10-CM | POA: Diagnosis not present

## 2020-08-24 DIAGNOSIS — J9621 Acute and chronic respiratory failure with hypoxia: Secondary | ICD-10-CM | POA: Diagnosis present

## 2020-08-24 DIAGNOSIS — E873 Alkalosis: Secondary | ICD-10-CM | POA: Diagnosis not present

## 2020-08-24 DIAGNOSIS — Z79891 Long term (current) use of opiate analgesic: Secondary | ICD-10-CM | POA: Diagnosis not present

## 2020-08-24 DIAGNOSIS — Z7189 Other specified counseling: Secondary | ICD-10-CM | POA: Diagnosis not present

## 2020-08-24 DIAGNOSIS — E871 Hypo-osmolality and hyponatremia: Secondary | ICD-10-CM | POA: Diagnosis present

## 2020-08-24 DIAGNOSIS — I428 Other cardiomyopathies: Secondary | ICD-10-CM | POA: Diagnosis present

## 2020-08-24 DIAGNOSIS — I509 Heart failure, unspecified: Secondary | ICD-10-CM

## 2020-08-24 DIAGNOSIS — I5033 Acute on chronic diastolic (congestive) heart failure: Secondary | ICD-10-CM | POA: Diagnosis present

## 2020-08-24 DIAGNOSIS — U071 COVID-19: Secondary | ICD-10-CM | POA: Diagnosis present

## 2020-08-24 DIAGNOSIS — I2721 Secondary pulmonary arterial hypertension: Secondary | ICD-10-CM | POA: Diagnosis present

## 2020-08-24 DIAGNOSIS — G43909 Migraine, unspecified, not intractable, without status migrainosus: Secondary | ICD-10-CM | POA: Diagnosis present

## 2020-08-24 DIAGNOSIS — Z8249 Family history of ischemic heart disease and other diseases of the circulatory system: Secondary | ICD-10-CM | POA: Diagnosis not present

## 2020-08-24 DIAGNOSIS — F32A Depression, unspecified: Secondary | ICD-10-CM | POA: Diagnosis present

## 2020-08-24 LAB — OSMOLALITY, URINE: Osmolality, Ur: 289 mOsm/kg — ABNORMAL LOW (ref 300–900)

## 2020-08-24 LAB — RESP PANEL BY RT-PCR (FLU A&B, COVID) ARPGX2
Influenza A by PCR: NEGATIVE
Influenza B by PCR: NEGATIVE
SARS Coronavirus 2 by RT PCR: POSITIVE — AB

## 2020-08-24 LAB — SODIUM, URINE, RANDOM: Sodium, Ur: 83 mmol/L

## 2020-08-24 LAB — OSMOLALITY: Osmolality: 269 mOsm/kg — ABNORMAL LOW (ref 275–295)

## 2020-08-24 MED ORDER — METOPROLOL TARTRATE 25 MG PO TABS
25.0000 mg | ORAL_TABLET | Freq: Three times a day (TID) | ORAL | Status: DC
  Administered 2020-08-24 – 2020-08-27 (×9): 25 mg via ORAL
  Filled 2020-08-24 (×9): qty 1

## 2020-08-24 MED ORDER — IPRATROPIUM-ALBUTEROL 0.5-2.5 (3) MG/3ML IN SOLN
3.0000 mL | Freq: Four times a day (QID) | RESPIRATORY_TRACT | Status: DC | PRN
  Administered 2020-08-24: 3 mL via RESPIRATORY_TRACT
  Filled 2020-08-24: qty 3

## 2020-08-24 MED ORDER — DILTIAZEM HCL 25 MG/5ML IV SOLN: 10.0000 mg | Freq: Once | INTRAVENOUS | Status: DC

## 2020-08-24 MED ORDER — AMIODARONE HCL 200 MG PO TABS
200.0000 mg | ORAL_TABLET | Freq: Two times a day (BID) | ORAL | Status: DC
  Administered 2020-08-24 – 2020-08-27 (×7): 200 mg via ORAL
  Filled 2020-08-24 (×8): qty 1

## 2020-08-24 MED ORDER — GLYCOPYRROLATE 1 MG PO TABS
1.0000 mg | ORAL_TABLET | Freq: Three times a day (TID) | ORAL | Status: DC
  Filled 2020-08-24: qty 1

## 2020-08-24 MED ORDER — ONDANSETRON HCL 4 MG/2ML IJ SOLN
4.0000 mg | Freq: Four times a day (QID) | INTRAMUSCULAR | Status: DC | PRN
  Administered 2020-08-24: 4 mg via INTRAVENOUS
  Filled 2020-08-24: qty 2

## 2020-08-24 MED ORDER — METOPROLOL TARTRATE 25 MG PO TABS: 25.0000 mg | ORAL_TABLET | Freq: Two times a day (BID) | ORAL | Status: DC

## 2020-08-24 MED ORDER — DIGOXIN 125 MCG PO TABS
125.0000 ug | ORAL_TABLET | Freq: Every day | ORAL | Status: DC
  Administered 2020-08-24 – 2020-08-27 (×4): 125 ug via ORAL
  Filled 2020-08-24 (×5): qty 1

## 2020-08-24 MED ORDER — ACETYLCYSTEINE 20 % IN SOLN
4.0000 mL | Freq: Every day | RESPIRATORY_TRACT | Status: DC
  Administered 2020-08-24: 4 mL via RESPIRATORY_TRACT
  Filled 2020-08-24 (×2): qty 4

## 2020-08-24 MED ORDER — METOPROLOL TARTRATE 5 MG/5ML IV SOLN: 2.5000 mg | INTRAVENOUS | Status: DC | PRN

## 2020-08-24 MED ORDER — FUROSEMIDE 10 MG/ML IJ SOLN
40.0000 mg | Freq: Two times a day (BID) | INTRAMUSCULAR | Status: DC
  Administered 2020-08-24 – 2020-08-25 (×3): 40 mg via INTRAVENOUS
  Filled 2020-08-24 (×3): qty 4

## 2020-08-24 MED ORDER — IPRATROPIUM-ALBUTEROL 20-100 MCG/ACT IN AERS
1.0000 | INHALATION_SPRAY | Freq: Four times a day (QID) | RESPIRATORY_TRACT | Status: DC | PRN
  Filled 2020-08-24: qty 4

## 2020-08-24 MED ORDER — ONDANSETRON HCL 4 MG PO TABS
4.0000 mg | ORAL_TABLET | Freq: Four times a day (QID) | ORAL | Status: DC | PRN
  Administered 2020-08-26: 4 mg via ORAL
  Filled 2020-08-24: qty 1

## 2020-08-24 MED ORDER — SODIUM CHLORIDE 0.9 % IV SOLN
12.5000 mg | Freq: Four times a day (QID) | INTRAVENOUS | Status: DC | PRN
  Filled 2020-08-24: qty 0.5

## 2020-08-24 MED ORDER — BENZONATATE 100 MG PO CAPS
200.0000 mg | ORAL_CAPSULE | Freq: Three times a day (TID) | ORAL | Status: DC | PRN
  Filled 2020-08-24: qty 2

## 2020-08-24 MED ORDER — PREDNISONE 10 MG PO TABS
10.0000 mg | ORAL_TABLET | Freq: Every day | ORAL | Status: DC
  Administered 2020-08-24 – 2020-08-27 (×4): 10 mg via ORAL
  Filled 2020-08-24 (×4): qty 1

## 2020-08-24 MED ORDER — DIGOXIN 0.25 MG/ML IJ SOLN
0.1250 mg | Freq: Once | INTRAMUSCULAR | Status: AC
  Administered 2020-08-24: 0.125 mg via INTRAVENOUS
  Filled 2020-08-24 (×2): qty 2

## 2020-08-24 MED ORDER — POLYETHYLENE GLYCOL 3350 17 G PO PACK
17.0000 g | PACK | Freq: Every day | ORAL | Status: DC | PRN
  Administered 2020-08-25 – 2020-08-27 (×2): 17 g via ORAL
  Filled 2020-08-24 (×3): qty 1

## 2020-08-24 MED ORDER — ACETAMINOPHEN 325 MG PO TABS
650.0000 mg | ORAL_TABLET | Freq: Four times a day (QID) | ORAL | Status: DC | PRN
  Administered 2020-08-24 – 2020-08-27 (×5): 650 mg via ORAL
  Filled 2020-08-24 (×5): qty 2

## 2020-08-24 MED ORDER — DILTIAZEM HCL 30 MG PO TABS: 30.0000 mg | ORAL_TABLET | Freq: Four times a day (QID) | ORAL | Status: DC | PRN

## 2020-08-24 MED ORDER — FERROUS SULFATE 325 (65 FE) MG PO TABS
325.0000 mg | ORAL_TABLET | Freq: Every day | ORAL | Status: DC
  Administered 2020-08-24 – 2020-08-27 (×4): 325 mg via ORAL
  Filled 2020-08-24 (×4): qty 1

## 2020-08-24 MED ORDER — APIXABAN 5 MG PO TABS
5.0000 mg | ORAL_TABLET | Freq: Two times a day (BID) | ORAL | Status: DC
  Administered 2020-08-24 – 2020-08-25 (×4): 5 mg via ORAL
  Filled 2020-08-24 (×3): qty 1

## 2020-08-24 MED ORDER — ACETAMINOPHEN 650 MG RE SUPP: 650.0000 mg | Freq: Four times a day (QID) | RECTAL | Status: DC | PRN

## 2020-08-24 MED ORDER — GABAPENTIN 300 MG PO CAPS
300.0000 mg | ORAL_CAPSULE | Freq: Every day | ORAL | Status: DC
  Administered 2020-08-24 – 2020-08-26 (×3): 300 mg via ORAL
  Filled 2020-08-24 (×3): qty 1

## 2020-08-24 MED ORDER — DICYCLOMINE HCL 20 MG PO TABS
20.0000 mg | ORAL_TABLET | Freq: Three times a day (TID) | ORAL | Status: DC
  Administered 2020-08-24 – 2020-08-27 (×8): 20 mg via ORAL
  Filled 2020-08-24 (×10): qty 1

## 2020-08-24 MED ORDER — PANTOPRAZOLE SODIUM 40 MG PO TBEC
40.0000 mg | DELAYED_RELEASE_TABLET | Freq: Every day | ORAL | Status: DC
  Administered 2020-08-24 – 2020-08-27 (×4): 40 mg via ORAL
  Filled 2020-08-24 (×4): qty 1

## 2020-08-24 NOTE — Progress Notes (Signed)
PROGRESS NOTE    Ana Washington  ZDG:[id number] DOB: 12-25-1945 DOA: 08/23/2020 PCP: [name], MD   Brief Narrative:   75 y.o. female with medical history significant for , nonischemic cardiomyopathy, diastolic CHF, last EF 50 to 55% 03/2020, Takotsubo cardiomyopathy, HTN, paroxysmal A. 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 fib, who presents with an unintentional overdose of morphine, reversed with Narcan by EMS.  Patient reports that she had been having a headache that was severe and felt worsening shortness of breath and asked that her sister administer some leftover morphine that she had.  She became obtunded after administration of morphine and EMS was called out.  She returned to normal mentation following a dose of Narcan but was subsequently brought to the ED.  She continued to have shortness of breath but denied chest pain.  Cough is at baseline.  Had no fever or chills.  Denied nausea, vomiting, abdominal pain or diarrhea.  Remains in rapid atrial fibrillation.  Rate has been difficult to control.  Is on IV diuresis for decompensated heart failure.  Assessment & Plan:   Principal Problem:   Morphine Overdose, accidental or unintentional, initial encounter Active Problems:   Essential hypertension   Coronary artery disease involving native coronary artery of native heart without angina pectoris   Non-ischemic cardiomyopathy ([place])   Long term current use of opiate analgesic   Chronic anticoagulation (ELIQUIS)   Atrial fibrillation, chronic (HCC)   Bronchiectasis with (acute) exacerbation (HCC)   Chronic respiratory failure with hypoxia (HCC)   Chronic hyponatremia   Acute on chronic diastolic CHF (congestive heart failure) (HCC)   CHF (congestive heart failure) (HCC)   Morphine Overdose, accidental or  unintentional, - Resolved with Narcan administered by EMS - Continue to monitor respiratory status - Narcan as needed   Acute dyspnea Chronic respiratory failure with hypoxia - Multifactorial related to exacerbations of bronchiectasis, diastolic heart failure and depression from morphine overdose - No increasing oxygen needs.  Saturating in the 90s on 3 L - Treat each individual etiology as outlined above and below   Bronchiectasis with (acute) exacerbation (HCC) - DuoNebs/Xopenex every 6 and as needed - Continue acetylcysteine nebulizer solution - Continue Tessalon   Acute on chronic diastolic CHF (congestive heart failure) Nonischemic cardiomyopathy -BNP > 1400.  Baseline in the 300s.  No pulmonary edema on chest x-ray - Patient reports she held off her diuretic for a few days recently Plan: Lasix IV 40 mg twice daily Daily weights, strict I's and O's Continue metoprolol and digoxin   Atrial fibrillation with rapid ventricular response - Rates in 120s 130s Cardiology consulted Plan: Continue current regimen of beta-blocker, amiodarone, digoxin Slowly uptitrate beta-blocker and amiodarone to achieve good rate control   Chronic pain - Continue gabapentin     Chronic hyponatremia with mild worsening - Sodium 123, baseline 129-131 - Suspect hypervolemic - Follow urine and sodium osmolality  Patient is chronically ill and is well-known to the hospitalist, cardiology, pulmonary service.  She has had frequent readmissions and overall prognosis remains very poor.  Apparently she was discharged and did engage hospice services however was felt to be too well for hospice services and was subsequently discharged.  I will reengage palliative care for further discussion regarding goals of care.  We will attempt to medically optimize   DVT prophylaxis: Eliquis Code Status: DNR  Family Communication: Daughter bedside Disposition Plan:Status is: Inpatient  Remains inpatient appropriate  because:Inpatient level of care appropriate due to severity of illness  Dispo: The patient is from: Home              Anticipated d/c is to: Home              Patient currently is not medically stable to d/c.   Difficult to place patient No       Level of care: Progressive Cardiac  Consultants:  Cardiology  Procedures: (Don't include imaging studies which can be auto populated. Include things that cannot be auto populated i.e. Echo, Carotid and venous dopplers, Foley, Bipap, HD, tubes/drains, wound vac, central lines etc) None  Antimicrobials: (specify start and planned stop date. Auto populated tables are space occupying and do not give end dates) None   Subjective: Seen and examined.  Reports nausea and stomach pain.  Endorses shortness of breath.  Objective: Vitals:   08/24/20 1330 08/24/20 1400 08/24/20 1530 08/24/20 1533  BP: 121/88  (!) 132/91 (!) 132/91  Pulse: (!) 126 (!) 129 (!) 124 (!) 124  Resp: (!) 23 (!) 21 (!) 26   Temp:      TempSrc:      SpO2: 100% 100% 100%   Weight:      Height:        Intake/Output Summary (Last 24 hours) at 08/24/2020 1546 Last data filed at 08/24/2020 0800 Gross per 24 hour  Intake 325 ml  Output 825 ml  Net -500 ml   Filed Weights   08/23/20 2010  Weight: 50.1 kg    Examination:  General exam: Mild distress due to pain.  Appears frail and chronically ill Respiratory system: Diffuse scattered crackles bilaterally.  Normal work of breathing.  3 L Cardiovascular system: S1-S2, irregular rate, irregular rhythm, tachycardic, no murmurs Gastrointestinal system: Thin, nondistended, mild TTP epigastrium, normal bowel sounds Central nervous system: Alert and oriented. No focal neurological deficits. Extremities: Symmetric 5 x 5 power. Skin: No rashes, lesions or ulcers Psychiatry: Judgement and insight appear normal. Mood & affect appropriate.     Data Reviewed: I have personally reviewed following labs and imaging  studies  CBC: Recent Labs  Lab 08/23/20 2013  WBC 21.8*  NEUTROABS 20.1*  HGB 11.6*  HCT 33.9*  MCV 100.9*  PLT 782   Basic Metabolic Panel: Recent Labs  Lab 08/23/20 2013  NA 123*  K 4.8  CL 82*  CO2 34*  GLUCOSE 271*  BUN 11  CREATININE 0.55  CALCIUM 8.1*   GFR: Estimated Creatinine Clearance: 47.3 mL/min (by C-G formula based on SCr of 0.55 mg/dL). Liver Function Tests: No results for input(s): AST, ALT, ALKPHOS, BILITOT, PROT, ALBUMIN in the last 168 hours. No results for input(s): LIPASE, AMYLASE in the last 168 hours. No results for input(s): AMMONIA in the last 168 hours. Coagulation Profile: No results for input(s): INR, PROTIME in the last 168 hours. Cardiac Enzymes: No results for input(s): CKTOTAL, CKMB, CKMBINDEX, TROPONINI in the last 168 hours. BNP (last 3 results) No results for input(s): PROBNP in the last 8760 hours. HbA1C: No results for input(s): HGBA1C in the last 72 hours. CBG: No results for input(s): GLUCAP in the last 168 hours. Lipid Profile: No results for input(s): CHOL, HDL, LDLCALC, TRIG, CHOLHDL, LDLDIRECT in the last 72 hours. Thyroid Function Tests: No results for input(s): TSH, T4TOTAL, FREET4, T3FREE, THYROIDAB in the last 72 hours. Anemia Panel: No results for input(s): VITAMINB12,  FOLATE, FERRITIN, TIBC, IRON, RETICCTPCT in the last 72 hours. Sepsis Labs: No results for input(s): PROCALCITON, LATICACIDVEN in the last 168 hours.  Recent Results (from the past 240 hour(s))  Resp Panel by RT-PCR (Flu A&B, Covid) Nasopharyngeal Swab     Status: Abnormal   Collection Time: 08/24/20 11:12 AM   Specimen: Nasopharyngeal Swab; Nasopharyngeal(NP) swabs in vial transport medium  Result Value Ref Range Status   SARS Coronavirus 2 by RT PCR POSITIVE (A) NEGATIVE Final    Comment: CRITICAL RESULT CALLED TO, READ BACK BY AND VERIFIED WITH:  Reyes Ivan, RN AT 1224 08/24/2020. GAA. (NOTE) SARS-CoV-2 target nucleic acids are  DETECTED.  The SARS-CoV-2 RNA is generally detectable in upper respiratory specimens during the acute phase of infection. Positive results are indicative of the presence of the identified virus, but do not rule out bacterial infection or co-infection with other pathogens not detected by the test. Clinical correlation with patient history and other diagnostic information is necessary to determine patient infection status. The expected result is Negative.  Fact Sheet for Patients: EntrepreneurPulse.com.au  Fact Sheet for Healthcare Providers: IncredibleEmployment.be  This test is not yet approved or cleared by the [place] FDA and  has been authorized for detection and/or diagnosis of SARS-CoV-2 by FDA under an Emergency Use Authorization (EUA).  This EUA will remain in effect (meani ng this test can be used) for the duration of  the COVID-19 declaration under Section 564(b)(1) of the Act, 21 U.S.C. section 360bbb-3(b)(1), unless the authorization is terminated or revoked sooner.     Influenza A by PCR NEGATIVE NEGATIVE Final   Influenza B by PCR NEGATIVE NEGATIVE Final    Comment: (NOTE) The Xpert Xpress SARS-CoV-2/FLU/RSV plus assay is intended as an aid in the diagnosis of influenza from Nasopharyngeal swab specimens and should not be used as a sole basis for treatment. Nasal washings and aspirates are unacceptable for Xpert Xpress SARS-CoV-2/FLU/RSV testing.  Fact Sheet for Patients: EntrepreneurPulse.com.au  Fact Sheet for Healthcare Providers: IncredibleEmployment.be  This test is not yet approved or cleared by the [place] FDA and has been authorized for detection and/or diagnosis of SARS-CoV-2 by FDA under an Emergency Use Authorization (EUA). This EUA will remain in effect (meaning this test can be used) for the duration of the COVID-19 declaration under Section 564(b)(1) of the Act, 21  U.S.C. section 360bbb-3(b)(1), unless the authorization is terminated or revoked.  Performed at [facility], [address], [place], [place] [address]          Radiology Studies: CT Head Wo Contrast  Result Date: 08/23/2020 CLINICAL DATA:  Headache.  Suspect intracranial hemorrhage. EXAM: CT HEAD WITHOUT CONTRAST TECHNIQUE: Contiguous axial images were obtained from the base of the skull through the vertex without intravenous contrast. COMPARISON:  None. FINDINGS: Brain: No evidence of acute infarction, hemorrhage, hydrocephalus, extra-axial collection or mass lesion/mass effect. Mild diffuse cerebral atrophy. Low-attenuation changes in the deep white matter consistent with small vessel ischemia. Vascular: Moderate intracranial arterial vascular calcifications. Skull: Calvarium appears intact. Sinuses/Orbits: Small retention cyst in the right maxillary antrum. 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 Paranasal sinuses and mastoid air cells are otherwise clear. Other: None. IMPRESSION: No acute intracranial abnormalities. Chronic atrophy and small vessel ischemic changes. Electronically Signed   By: Lucienne Capers M.D.   On: 08/23/2020 21:59   DG Chest Port 1 View  Result Date: 08/23/2020 CLINICAL DATA:  Short of breath. EXAM: PORTABLE CHEST 1 VIEW COMPARISON:  08/02/2020 and older exams.  CT, 07/30/2020. FINDINGS: Cardiac silhouette is mostly obscured. There is bi basilar opacity consistent with small effusions. Post lung surgery changes are noted with left perihilar pulmonary anastomosis staples. There are also pulmonary anastomosis staples in the right upper lung extending from the right hilum. Lungs demonstrate prominent vascular and thickened interstitial markings similar to the prior exam. Additional opacity is noted at the left lung base consistent with atelectasis. No pneumothorax. No mediastinal or hilar masses. Numerous vertebral  fractures have been treated with vertebroplasty/kyphoplasty, unchanged. IMPRESSION: 1. Findings are similar to the most recent prior chest radiograph. No convincing pneumonia or pulmonary edema. 2. Bilateral lung base opacities consistent with small effusions, greater on the left. Left greater than right basilar atelectasis. 3. Significant chronic interstitial lung disease. Stable changes from prior bilateral lung surgery. Electronically Signed   By: Lajean Manes M.D.   On: 08/23/2020 20:57        Scheduled Meds:  acetylcysteine  4 mL Nebulization Daily   amiodarone  200 mg Oral BID   apixaban  5 mg Oral BID   dicyclomine  20 mg Oral TID AC   digoxin  125 mcg Oral Daily   ferrous sulfate  325 mg Oral Daily   furosemide  40 mg Intravenous BID   gabapentin  300 mg Oral QHS   metoprolol tartrate  25 mg Oral TID   pantoprazole  40 mg Oral Daily   predniSONE  10 mg Oral Daily   Continuous Infusions:  promethazine (PHENERGAN) injection (IM or IVPB)       LOS: 0 days    Time spent: 25 minutes    Sidney Ace, MD [facility] Pager [phone]  If 7PM-7AM, please contact night-coverage  08/24/2020, 3:46 PM

## 2020-08-24 NOTE — ED Notes (Signed)
Pt placed on bedpan. Continues to dry heave and complain of nausea. Md notified,.

## 2020-08-24 NOTE — ED Notes (Signed)
DNR armband placed on patients right wrist - per order.

## 2020-08-24 NOTE — Consult Note (Signed)
CARDIOLOGY CONSULT NOTE               Patient ID: Ana Washington MRN: [id number] DOB/AGE: Nov 21, 1945 75 y.o.  Admit date: 08/23/2020 Referring Physician Dr. Judd Gaudier hospitalist Primary Physician Dr. [name] primary Primary Cardiologist Dr. Jordan Hawks Reason for Consultation atrial fibrillation shortness of breath diastolic congestive heart failure  HPI: Patient is a 75 year old female multiple medical problems nonischemic cardiomyopathy due to Takotsubo last known EF is around 80 to 55% back in March 2022 she has history of hypertension paroxysmal atrial fibrillation on Eliquis has bronchiectasis and MAC she is on home O2 3 L she has chronic pain she has chronic opioid use started having acute dyspnea at home her family gave her increasing doses of morphine and she became somnolent with respiratory depression and subsequently emergency room was treated with Narcan for reversal but the patient still has dyspnea shortness of breath that she thinks is fluid accumulation denies any chest pain she is also found to be paroxysmal in rapid atrial fibrillation.  She has not had much in the way of leg swelling  Review of systems complete and found to be negative unless listed above     Past Medical History:  Diagnosis Date   Arthritis    Atrial fibrillation (HCC)    Bronchiectasis (HCC)    CAD (coronary artery disease) 07/30/2017   Dumping syndrome    Essential hypertension, malignant 10/03/2013   Family history of adverse reaction to anesthesia    sister [name]   GERD (gastroesophageal reflux disease)    Headache    MIGRAINES   Myocardial infarction ([place]) 2007   Non-STEMI   [name] (postoperative nausea and vomiting)    Psoriasis    PUD (peptic ulcer disease)     Past Surgical History:  Procedure Laterality Date   BACK SURGERY     CHOLECYSTECTOMY     ESOPHAGOGASTRODUODENOSCOPY (EGD) WITH PROPOFOL N/A 03/19/2019   Procedure: ESOPHAGOGASTRODUODENOSCOPY (EGD) WITH PROPOFOL;   Surgeon: [name], MD;  Location: [facility] ENDOSCOPY;  Service: Gastroenterology;  Laterality: N/A;  *Note to anesthesia: Per pt's pulmonologist, if intubating, please extubate to BIPAP.   EYE SURGERY     FOOT SURGERY     INTRAMEDULLARY (IM) NAIL INTERTROCHANTERIC Right 09/30/2018   Procedure: INTRAMEDULLARY (IM) NAIL INTERTROCHANTRIC;  Surgeon: Dereck Leep, MD;  Location: [facility] ORS;  Service: Orthopedics;  Laterality: Right;   KYPHOPLASTY N/A 07/05/2016   Procedure: KYPHOPLASTY T - 9;  Surgeon: Hessie Knows, MD;  Location: [facility] ORS;  Service: Orthopedics;  Laterality: N/A;   KYPHOPLASTY N/A 11/29/2017   Procedure: Iona Hansen;  Surgeon: Hessie Knows, MD;  Location: [facility] ORS;  Service: Orthopedics;  Laterality: N/A;  L2 and L3   KYPHOPLASTY N/A 12/18/2017   Procedure: KYPHOPLASTY L1;  Surgeon: Hessie Knows, MD;  Location: [facility] ORS;  Service: Orthopedics;  Laterality: N/A;   KYPHOPLASTY N/A 01/05/2018   Procedure: KYPHOPLASTY-T11,T12;  Surgeon: Hessie Knows, MD;  Location: [facility] ORS;  Service: Orthopedics;  Laterality: N/A;   KYPHOPLASTY N/A 04/05/2018   Procedure: T10 KYPHOPLASTY;  Surgeon: Hessie Knows, MD;  Location: [facility] ORS;  Service: Orthopedics;  Laterality: N/A;   KYPHOPLASTY N/A 04/12/2018   Procedure: KYPHOPLASTY T7,8;  Surgeon: Hessie Knows, MD;  Location: [facility] ORS;  Service: Orthopedics;  Laterality: N/A;   KYPHOPLASTY N/A 04/19/2018   Procedure: KYPHOPLASTY T5, T6;  Surgeon: Hessie Knows, MD;  Location: [facility] ORS;  Service: Orthopedics;  Laterality: N/A;   LUNG SURGERY  1990 and 1996  THOROCOTOMY WITH LOBECTOMY     LEFT LOWER THORACOTOMY / RIGHT MIDDLE LOBECTOMY    (Not in a hospital admission)  Social History   Socioeconomic History   Marital status: Married    Spouse name: Not on file   Number of children: Not on file   Years of education: Not on file   Highest education level: Not on file  Occupational History   Not on file  Tobacco Use   Smoking status: Never    Smokeless tobacco: Never  Vaping Use   Vaping Use: Never used  Substance and Sexual Activity   Alcohol use: No   Drug use: No   Sexual activity: Not on file  Other Topics Concern   Not on file  Social History Narrative   Not on file   Social Determinants of Health   Financial Resource Strain: Not on file  Food Insecurity: Not on file  Transportation Needs: Not on file  Physical Activity: Not on file  Stress: Not on file  Social Connections: Not on file  Intimate Partner Violence: Not on file    Family History  Problem Relation Age of Onset   Hypertension Mother    Hypertension Father       Review of systems complete and found to be negative unless listed above      PHYSICAL EXAM  General: Well developed, well nourished, in no acute distress HEENT:  Normocephalic and atramatic Neck:  No JVD.  Lungs: Diffuse rhonchi diminished bilaterally to auscultation and percussion. Heart: Irregular irregular tachycardic. Normal S1 and S2 without gallops or murmurs.  Abdomen: Bowel sounds are positive, abdomen soft and non-tender  Msk:  Back normal, normal gait. Normal strength and tone for age. Extremities: No clubbing, cyanosis or edema.   Neuro: Alert and oriented X 3. Psych:  Good affect, responds appropriately  Labs:   Lab Results  Component Value Date   WBC 21.8 (H) 08/23/2020   HGB 11.6 (L) 08/23/2020   HCT 33.9 (L) 08/23/2020   MCV 100.9 (H) 08/23/2020   PLT 282 08/23/2020    Recent Labs  Lab 08/23/20 2013  NA 123*  K 4.8  CL 82*  CO2 34*  BUN 11  CREATININE 0.55  CALCIUM 8.1*  GLUCOSE 271*   Lab Results  Component Value Date   TROPONINI <0.03 04/07/2018   No results found for: CHOL No results found for: HDL No results found for: LDLCALC No results found for: TRIG No results found for: CHOLHDL No results found for: LDLDIRECT    Radiology: CT ABDOMEN PELVIS WO CONTRAST  Result Date: 07/31/2020 CLINICAL DATA:  Lower abdominal pain.   Tachycardia and tachypnea. EXAM: CT ABDOMEN AND PELVIS WITHOUT CONTRAST TECHNIQUE: Multidetector CT imaging of the abdomen and pelvis was performed following the standard protocol without IV contrast. COMPARISON:  12/25/2018 and prior CT a chest 07/30/2020 FINDINGS: Lower chest: Stable large hiatal hernia containing most of the stomach. Extensive cylindrical bronchiectasis, airway thickening, and some airway plugging in the lung bases as shown on the exam from 07/30/2020. A small right pleural effusion is mildly increased from 07/30/2020. Mild cardiomegaly noted along with coronary artery and descending thoracic aortic atherosclerotic calcification. High-density material along the left lung base pleural margin, stable. Hepatobiliary: Cholecystectomy. Prominent common bile duct at 1.3 cm in diameter on image 33 series 5, previously the same on 12/25/2018, this biliary dilatation may represent a physiologic response to cholecystectomy. Pancreas: Unremarkable Spleen: Old granulomatous disease. Adrenals/Urinary Tract: Accentuated density in both renal  collecting systems probably left over from recent contrast injection. Stable left mid kidney exophytic lesion compatible with cyst. Contrast medium in the urinary bladder. Adrenal glands unremarkable. Stomach/Bowel: Large hiatal hernia containing the majority of the stomach as well as part of the transverse colon and splenic flexure. No findings of strangulation or obstruction, this herniation is chronic. Sigmoid colon diverticulosis without findings of active diverticulitis. Vascular/Lymphatic: Aortoiliac atherosclerotic vascular disease. Reproductive: Small calcified posterior uterine body fibroid. Other: No supplemental non-categorized findings. Musculoskeletal: Bony demineralization. Deformities from old bilateral pelvic fractures, stable from 12/25/2018. Right hip ORIF. Compression fractures at all lumbar levels and all visualized lower thoracic levels, with vertebral  augmentations at all visible lower thoracic levels and at L1, L2, and L3. The lumbar compression fractures at L4 and L5 appear similar to the 12/25/2018 exam. IMPRESSION: 1. A specific cause for patient's lower abdominal pain is not identified. 2. Small right pleural effusion, but increased in size compared to 07/30/2020. 3. Other imaging findings of potential clinical significance: Large hiatal hernia containing most of the stomach as well as part of the transverse colon. Prominent cylindrical bronchiectasis in both lower lobes with scattered airway plugging. Mild cardiomegaly. Aortic Atherosclerosis (ICD10-I70.0). Coronary atherosclerosis. Stable chronic extrahepatic biliary dilatation, possibly a physiologic response to cholecystectomy. Sigmoid diverticulosis. Small uterine fibroid. Old pelvic fractures. Chronic compression fractures at all visualized thoracic and lumbar levels with vertebral augmentations at all visualized thoracolumbar levels except for L4 and L5. Electronically Signed   By: Van Clines M.D.   On: 07/31/2020 20:56   DG Chest 1 View  Result Date: 07/30/2020 CLINICAL DATA:  COVID diagnosis 5 weeks prior, known metastatic disease, increasing shortness of breath with productive cough EXAM: CHEST  1 VIEW COMPARISON:  Radiograph 07/14/2020, CT 07/14/2020 FINDINGS: Large left diaphragmatic eventration. Some adjacent passive atelectatic changes. Diffuse reticulonodular opacities present throughout both lungs. Postsurgical changes in the left hilum and right upper lung are stable from priors and likely reflecting multiple resections. No pneumothorax. No visible layering effusion. Stable cardiomediastinal contours with a calcified aorta. Multilevel vertebroplasty changes are seen. No acute osseous abnormality or suspicious osseous lesion. The osseous structures appear diffusely demineralized which may limit detection of small or nondisplaced fractures. Degenerative changes in the shoulders,  right greater than left with high-riding humeral head suggesting underlying rotator cuff insufficiency. Telemetry leads overlie the chest. IMPRESSION: Diffuse reticulonodular opacities throughout the lungs, can be seen in the setting as atypical mycobacterial infection as suggested previously versus additional acute superimposed infection or inflammation. Large left diaphragmatic eventration. Chronic scarring and architectural distortion with areas of postsurgical changes in the left hilum and right upper lung. Aortic Atherosclerosis (ICD10-I70.0). Multilevel vertebroplasty. Electronically Signed   By: Lovena Le M.D.   On: 07/30/2020 20:34   CT Head Wo Contrast  Result Date: 08/23/2020 CLINICAL DATA:  Headache.  Suspect intracranial hemorrhage. EXAM: CT HEAD WITHOUT CONTRAST TECHNIQUE: Contiguous axial images were obtained from the base of the skull through the vertex without intravenous contrast. COMPARISON:  None. FINDINGS: Brain: No evidence of acute infarction, hemorrhage, hydrocephalus, extra-axial collection or mass lesion/mass effect. Mild diffuse cerebral atrophy. Low-attenuation changes in the deep white matter consistent with small vessel ischemia. Vascular: Moderate intracranial arterial vascular calcifications. Skull: Calvarium appears intact. Sinuses/Orbits: Small retention cyst in the right maxillary antrum. 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 Paranasal sinuses and mastoid air cells are otherwise clear. Other: None. IMPRESSION: No acute intracranial abnormalities. Chronic atrophy and small vessel ischemic changes. Electronically Signed  By: Lucienne Capers M.D.   On: 08/23/2020 21:59   CT Angio Chest PE W and/or Wo Contrast  Result Date: 07/30/2020 CLINICAL DATA:  Subacute COVID pneumonia, cough EXAM: CT ANGIOGRAPHY CHEST WITH CONTRAST TECHNIQUE: Multidetector CT imaging of the chest was performed using the standard protocol during bolus  administration of intravenous contrast. Multiplanar CT image reconstructions and MIPs were obtained to evaluate the vascular anatomy. CONTRAST:  68m OMNIPAQUE IOHEXOL 350 MG/ML SOLN COMPARISON:  07/14/2020 FINDINGS: Cardiovascular: There is adequate opacification of the residual pulmonary arterial tree. No intraluminal filling defect to suggest acute pulmonary embolism. The central pulmonary arteries are enlarged in keeping with changes of pulmonary arterial hypertension, unchanged from prior examination. Mild multi-vessel coronary artery calcification. Global cardiac size within normal limits. No pericardial effusion. Moderate atherosclerotic calcification within the abdominal aorta. No aortic aneurysm. Mediastinum/Nodes: No pathologic thoracic adenopathy. The visualized thyroid is unremarkable. Small amount of debris is seen within the esophagus possibly reflecting changes of esophageal dysmotility or gastroesophageal reflux. A large hiatal hernia is seen extending into the left hemithorax with herniation of the splenic flexure of the colon and nearly the entire stomach into the left hemithorax. There is marked tortuosity of the thoracic aorta which extends into the posterior left hemithorax as result of the a large hernia. Lungs/Pleura: Status post partial right upper lobectomy and right middle lobectomy as well as left lower lobectomy. 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 There is superimposed areas of cylindrical and varicoid bronchiectasis again identified, more focal within the residual right upper lobe and basilar right lower lobe though chronic infection with atypical organisms such as MAI could result in such an appearance, an additional consideration should include the sequela of remote or recurrent infection and/or aspiration. There is these changes appears stable since prior examination. No new focal  pulmonary nodules or infiltrates. No pneumothorax or pleural effusion. Upper Abdomen: Status post cholecystectomy.  No acute abnormality. Musculoskeletal: Multilevel thoracolumbar vertebroplasty has been performed of each visualized level inferior to T4. Stable remote compression fracture of T3. No acute bone abnormality. Review of the MIP images confirms the above findings. IMPRESSION: No pulmonary embolism. Mild coronary artery calcification. Morphologic changes in keeping with pulmonary arterial hypertension. Status post partial right upper lobe, right middle lobe, and left lower lobectomy. Extensive scattered areas of cylindrical and varicoid bronchiectasis with mild tree-in-bud nodularity stable since prior examination. Differential considerations include the sequela of remote or recurrent infection, aspiration, and atypical infection as can be seen with mycobacterial or atypical fungal infection. Mosaic attenuation of the pulmonary parenchyma in keeping with air trapping secondary to small airways disease. Aortic Atherosclerosis (ICD10-I70.0). Electronically Signed   By: AFidela SalisburyMD   On: 07/30/2020 22:16   DG Chest Port 1 View  Result Date: 08/23/2020 CLINICAL DATA:  Short of breath. EXAM: PORTABLE CHEST 1 VIEW COMPARISON:  08/02/2020 and older exams.  CT, 07/30/2020. FINDINGS: Cardiac silhouette is mostly obscured. There is bi basilar opacity consistent with small effusions. Post lung surgery changes are noted with left perihilar pulmonary anastomosis staples. There are also pulmonary anastomosis staples in the right upper lung extending from the right hilum. Lungs demonstrate prominent vascular and thickened interstitial markings similar to the prior exam. Additional opacity is noted at the left lung base consistent with atelectasis. No pneumothorax. No mediastinal or hilar masses. Numerous vertebral fractures have been treated with vertebroplasty/kyphoplasty, unchanged. IMPRESSION: 1. Findings  are similar  to the most recent prior chest radiograph. No convincing pneumonia or pulmonary edema. 2. Bilateral lung base opacities consistent with small effusions, greater on the left. Left greater than right basilar atelectasis. 3. Significant chronic interstitial lung disease. Stable changes from prior bilateral lung surgery. Electronically Signed   By: Lajean Manes M.D.   On: 08/23/2020 20:57   DG Chest Port 1 View  Result Date: 08/02/2020 CLINICAL DATA:  Chronic hypoxemia. EXAM: PORTABLE CHEST 1 VIEW COMPARISON:  07/30/2020 FINDINGS: Stable cardiomediastinal contours. Postoperative changes with suture chains noted in the perihilar right lung and right upper lobe as well as the left midlung and perihilar left mid lung. Diffuse chronic interstitial advanced chronic interstitial changes are identified throughout both lungs reflecting underlying postinflammatory changes including bronchiectasis, interstitial scarring and reticulation. Small right pleural effusion unchanged. Large hiatal hernia is identified which overlies the left heart and left lower lung. Unchanged. IMPRESSION: 1. Severe chronic interstitial lung disease appears unchanged from previous exam. 2. Stable appearance of large hiatal hernia. Electronically Signed   By: Kerby Moors M.D.   On: 08/02/2020 12:31    EKG: Atrial fibrillation nonspecific ST-T changes rapid ventricular response  ASSESSMENT AND PLAN:  Respiratory failure potentially secondary to narcotics unintentional overdose Cardiomyopathy history of Takotsubo EF preserved between 50 and 55% Bronchiectasis with MAC Acute dyspnea Acute on chronic diastolic congestive heart failure with preserved left ventricular function Paroxysmal atrial fibrillation rapid ventricular response Mild hyponatremia Chronic pain  Plan Agree with admit for supplemental oxygen and respiratory support Continue amiodarone therapy for antiarrhythmic we will consider increasing the dose Maintain  digoxin at current levels dig level was acceptable Metoprolol at 25 mg we will increase the dose to see if we can help with rate management and control Maintain Eliquis for anticoagulation for atrial fibrillation Diuretic therapy because of significant congestive heart failure elevated BNP Maintain omeprazole therapy for reflux type symptoms Elevated white count may represent infection consider antibiotic therapy but may also be related to chronic steroid therapy Gradually increase metoprolol and increase amiodarone to help with rate control  Signed: Yolonda Kida MD 08/24/2020, 12:15 PM

## 2020-08-25 ENCOUNTER — Other Ambulatory Visit: Payer: Self-pay

## 2020-08-25 ENCOUNTER — Telehealth: Payer: Self-pay | Admitting: Nurse Practitioner

## 2020-08-25 ENCOUNTER — Encounter: Payer: Self-pay | Admitting: Internal Medicine

## 2020-08-25 ENCOUNTER — Other Ambulatory Visit: Payer: Medicare HMO | Admitting: Nurse Practitioner

## 2020-08-25 DIAGNOSIS — T50901A Poisoning by unspecified drugs, medicaments and biological substances, accidental (unintentional), initial encounter: Secondary | ICD-10-CM | POA: Diagnosis not present

## 2020-08-25 DIAGNOSIS — Z7189 Other specified counseling: Secondary | ICD-10-CM | POA: Diagnosis not present

## 2020-08-25 LAB — CBC WITH DIFFERENTIAL/PLATELET
Abs Immature Granulocytes: 0.15 10*3/uL — ABNORMAL HIGH (ref 0.00–0.07)
Basophils Absolute: 0.1 10*3/uL (ref 0.0–0.1)
Basophils Relative: 1 %
Eosinophils Absolute: 0.1 10*3/uL (ref 0.0–0.5)
Eosinophils Relative: 1 %
HCT: 34 % — ABNORMAL LOW (ref 36.0–46.0)
Hemoglobin: 11.9 g/dL — ABNORMAL LOW (ref 12.0–15.0)
Immature Granulocytes: 1 %
Lymphocytes Relative: 19 %
Lymphs Abs: 2.4 10*3/uL (ref 0.7–4.0)
MCH: 34 pg (ref 26.0–34.0)
MCHC: 35 g/dL (ref 30.0–36.0)
MCV: 97.1 fL (ref 80.0–100.0)
Monocytes Absolute: 1.1 10*3/uL — ABNORMAL HIGH (ref 0.1–1.0)
Monocytes Relative: 9 %
Neutro Abs: 8.9 10*3/uL — ABNORMAL HIGH (ref 1.7–7.7)
Neutrophils Relative %: 69 %
Platelets: 244 10*3/uL (ref 150–400)
RBC: 3.5 MIL/uL — ABNORMAL LOW (ref 3.87–5.11)
RDW: 12.8 % (ref 11.5–15.5)
WBC: 12.7 10*3/uL — ABNORMAL HIGH (ref 4.0–10.5)
nRBC: 0 % (ref 0.0–0.2)

## 2020-08-25 LAB — BASIC METABOLIC PANEL
Anion gap: 12 (ref 5–15)
BUN: 12 mg/dL (ref 8–23)
CO2: 40 mmol/L — ABNORMAL HIGH (ref 22–32)
Calcium: 8.4 mg/dL — ABNORMAL LOW (ref 8.9–10.3)
Chloride: 73 mmol/L — ABNORMAL LOW (ref 98–111)
Creatinine, Ser: 0.6 mg/dL (ref 0.44–1.00)
GFR, Estimated: >60 mL/min (ref >60)
Glucose, Bld: 77 mg/dL (ref 70–99)
Potassium: 3.4 mmol/L — ABNORMAL LOW (ref 3.5–5.1)
Sodium: 125 mmol/L — ABNORMAL LOW (ref 135–145)

## 2020-08-25 LAB — BRAIN NATRIURETIC PEPTIDE: B Natriuretic Peptide: 1154.6 pg/mL — ABNORMAL HIGH (ref 0.0–100.0)

## 2020-08-25 MED ORDER — ACETYLCYSTEINE 20 % IN SOLN: 4.0000 mL | Freq: Every day | RESPIRATORY_TRACT | Status: DC

## 2020-08-25 MED ORDER — FUROSEMIDE 10 MG/ML IJ SOLN
40.0000 mg | Freq: Every day | INTRAMUSCULAR | Status: DC
  Administered 2020-08-26: 40 mg via INTRAVENOUS
  Filled 2020-08-25 (×2): qty 4

## 2020-08-25 MED ORDER — GUAIFENESIN ER 600 MG PO TB12
1200.0000 mg | ORAL_TABLET | Freq: Two times a day (BID) | ORAL | Status: DC
  Administered 2020-08-25 – 2020-08-27 (×4): 1200 mg via ORAL
  Filled 2020-08-25 (×4): qty 2

## 2020-08-25 MED ORDER — IPRATROPIUM-ALBUTEROL 0.5-2.5 (3) MG/3ML IN SOLN: 3.0000 mL | Freq: Four times a day (QID) | RESPIRATORY_TRACT | Status: DC | PRN

## 2020-08-25 MED ORDER — LORAZEPAM 0.5 MG PO TABS
0.5000 mg | ORAL_TABLET | Freq: Every evening | ORAL | Status: DC | PRN
  Administered 2020-08-25 – 2020-08-26 (×2): 0.5 mg via ORAL
  Filled 2020-08-25 (×2): qty 1

## 2020-08-25 MED ORDER — POTASSIUM CHLORIDE CRYS ER 20 MEQ PO TBCR
40.0000 meq | EXTENDED_RELEASE_TABLET | Freq: Once | ORAL | Status: AC
  Administered 2020-08-25: 40 meq via ORAL
  Filled 2020-08-25: qty 2

## 2020-08-25 MED ORDER — LEVALBUTEROL TARTRATE 45 MCG/ACT IN AERO
2.0000 | INHALATION_SPRAY | Freq: Four times a day (QID) | RESPIRATORY_TRACT | Status: DC
  Administered 2020-08-25 – 2020-08-27 (×7): 2 via RESPIRATORY_TRACT
  Filled 2020-08-25: qty 15

## 2020-08-25 MED ORDER — LEVALBUTEROL HCL 1.25 MG/0.5ML IN NEBU: 1.2500 mg | INHALATION_SOLUTION | Freq: Four times a day (QID) | RESPIRATORY_TRACT | Status: DC

## 2020-08-25 MED ORDER — ALUM & MAG HYDROXIDE-SIMETH 200-200-20 MG/5ML PO SUSP
30.0000 mL | Freq: Four times a day (QID) | ORAL | Status: DC | PRN
  Administered 2020-08-25 – 2020-08-27 (×4): 30 mL via ORAL
  Filled 2020-08-25 (×4): qty 30

## 2020-08-25 NOTE — ED Notes (Signed)
Pt assisted to bedside commode by Joellen Jersey, RN

## 2020-08-25 NOTE — Consult Note (Signed)
   Heart Failure Nurse Navigator Note  HFpEF 50-55%  She presented to the emergency room for an unintentional overdose of morphine and worsening shortness of breath.  Comorbidities:  Hypertension Paroxysmal atrial fibrillation on Eliquis Nonischemic cardiomyopathy Arthritis   Medications:  Amiodarone 200 mg twice a day Apixaban 5 mg 2 times a day Digoxin 125 mcg once daily Diltiazem 30 mg every 6 hours as needed Furosemide 40 mg IV 2 times a day   Labs:  Sodium 125, potassium 3.4, chlorides 73, CO2 40, BUN 12, creatinine 0.6.  BNP is pending.  Initial meeting with patient.  She states she lives at home with her husband.  She has a caregiver that comes in from 9 in the morning until mid afternoon and fixes their breakfast and lunch.  She states that evening meal is usually brought in by her son from local restaurants.  She states that she knows these are higher in sodium but states that she does not have much of an appetite and does not eat a whole meal.  She also admits to using some salt at the table, she comments that is not like she used to use salt.  She does weigh herself daily, and has been instructed by her primary cardiologist if she has a 3 pound weight gain overnight she is to take her furosemide.  She states that at home her weight has been running between 101 and 102.  She had noticed an increase in her shortness of breath.  She states at home she ambulates with a walker, also has a nurse and PT coming in to her home.  Is followed by Palliative. She feels that each time she is admitted to the hospital she gets weaker.   Discussed living with heart failure teaching booklet, pointed out page on restaurant dining and foods to avoid.  She states usually if they get [place] that she will get the fajita.  Admits to rare piece of pizza which she states is usually pepperoni thin crust.  Pricilla Riffle RN [facility]

## 2020-08-25 NOTE — Progress Notes (Signed)
[facility] Cardiology    SUBJECTIVE: Patient feels somewhat better now less fatigue had generalized weakness shortness of breath dyspnea denies significant chest pain feels somewhat better   Vitals:   08/25/20 0500 08/25/20 0630 08/25/20 0700 08/25/20 0730  BP: 118/77 (!) 127/96 119/77 139/85  Pulse: 71 70 62 62  Resp: '20 14 18 18  '$ Temp:      TempSrc:      SpO2: 100% 100% 100% 100%  Weight:      Height:         Intake/Output Summary (Last 24 hours) at 08/25/2020 1216 Last data filed at 08/25/2020 0248 Gross per 24 hour  Intake --  Output 900 ml  Net -900 ml      PHYSICAL EXAM  General: Well developed, well nourished, in no acute distress HEENT:  Normocephalic and atramatic Neck:  No JVD.  Lungs: Diminished diffuse bilaterally to auscultation and percussion. Heart: HRRR . Normal S1 and S2 without gallops or murmurs.  Abdomen: Bowel sounds are positive, abdomen soft and non-tender  Msk:  Back normal, normal gait. Normal strength and tone for age. Extremities: No clubbing, cyanosis or edema.   Neuro: Alert and oriented X 3. Psych:  Good affect, responds appropriately   LABS: Basic Metabolic Panel: Recent Labs    08/23/20 2013 08/25/20 0912  NA 123* 125*  K 4.8 3.4*  CL 82* 73*  CO2 34* 40*  GLUCOSE 271* 77  BUN 11 12  CREATININE 0.55 0.60  CALCIUM 8.1* 8.4*   Liver Function Tests: No results for input(s): AST, ALT, ALKPHOS, BILITOT, PROT, ALBUMIN in the last 72 hours. No results for input(s): LIPASE, AMYLASE in the last 72 hours. CBC: Recent Labs    08/23/20 2013 08/25/20 0912  WBC 21.8* 12.7*  NEUTROABS 20.1* 8.9*  HGB 11.6* 11.9*  HCT 33.9* 34.0*  MCV 100.9* 97.1  PLT 282 244   Cardiac Enzymes: No results for input(s): CKTOTAL, CKMB, CKMBINDEX, TROPONINI in the last 72 hours. BNP: Invalid input(s): POCBNP D-Dimer: No results for input(s): DDIMER in the last 72 hours. Hemoglobin A1C: No results for input(s): HGBA1C in the last 72 hours. Fasting  Lipid Panel: No results for input(s): CHOL, HDL, LDLCALC, TRIG, CHOLHDL, LDLDIRECT in the last 72 hours. Thyroid Function Tests: No results for input(s): TSH, T4TOTAL, T3FREE, THYROIDAB in the last 72 hours.  Invalid input(s): FREET3 Anemia Panel: No results for input(s): VITAMINB12, FOLATE, FERRITIN, TIBC, IRON, RETICCTPCT in the last 72 hours.  CT Head Wo Contrast  Result Date: 08/23/2020 CLINICAL DATA:  Headache.  Suspect intracranial hemorrhage. EXAM: CT HEAD WITHOUT CONTRAST TECHNIQUE: Contiguous axial images were obtained from the base of the skull through the vertex without intravenous contrast. COMPARISON:  None. FINDINGS: Brain: No evidence of acute infarction, hemorrhage, hydrocephalus, extra-axial collection or mass lesion/mass effect. Mild diffuse cerebral atrophy. Low-attenuation changes in the deep white matter consistent with small vessel ischemia. Vascular: Moderate intracranial arterial vascular calcifications. Skull: Calvarium appears intact. Sinuses/Orbits: Small retention cyst in the right maxillary antrum. 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 Paranasal sinuses and mastoid air cells are otherwise clear. Other: None. IMPRESSION: No acute intracranial abnormalities. Chronic atrophy and small vessel ischemic changes. Electronically Signed   By: Lucienne Capers M.D.   On: 08/23/2020 21:59   DG Chest Port 1 View  Result Date: 08/23/2020 CLINICAL DATA:  Short of breath. EXAM: PORTABLE CHEST 1 VIEW COMPARISON:  08/02/2020 and older exams.  CT, 07/30/2020. FINDINGS: Cardiac silhouette is mostly obscured.  There is bi basilar opacity consistent with small effusions. Post lung surgery changes are noted with left perihilar pulmonary anastomosis staples. There are also pulmonary anastomosis staples in the right upper lung extending from the right hilum. Lungs demonstrate prominent vascular and thickened interstitial markings similar to the prior exam.  Additional opacity is noted at the left lung base consistent with atelectasis. No pneumothorax. No mediastinal or hilar masses. Numerous vertebral fractures have been treated with vertebroplasty/kyphoplasty, unchanged. IMPRESSION: 1. Findings are similar to the most recent prior chest radiograph. No convincing pneumonia or pulmonary edema. 2. Bilateral lung base opacities consistent with small effusions, greater on the left. Left greater than right basilar atelectasis. 3. Significant chronic interstitial lung disease. Stable changes from prior bilateral lung surgery. Electronically Signed   By: Lajean Manes M.D.   On: 08/23/2020 20:57     Echo pending  TELEMETRY: Normal sinus rhythm nonspecific ST-T changes rate of 80:  ASSESSMENT AND PLAN:  Principal Problem:   Morphine Overdose, accidental or unintentional, initial encounter Active Problems:   Essential hypertension   Coronary artery disease involving native coronary artery of native heart without angina pectoris   Non-ischemic cardiomyopathy ([place])   Long term current use of opiate analgesic   Chronic anticoagulation (ELIQUIS)   Atrial fibrillation, chronic (HCC)   Bronchiectasis with (acute) exacerbation (HCC)   Chronic respiratory failure with hypoxia (HCC)   Chronic hyponatremia   Acute on chronic diastolic CHF (congestive heart failure) (HCC)   CHF (congestive heart failure) ([place])   Plan Agree with admit to telemetry monitor Rule out myocardial infarction follow-up troponins EKGs Respiratory support for shortness of breath dyspnea Continue anticoagulation for atrial fibrillation Cardiomyopathy support Inhalers and necessary for shortness of breath Correct electrolytes as necessary Continue current therapy for bronchiectasis afollow-up with pulmonary Supportive care for congestive heart failure cardiomyopathy    Yolonda Kida, MD 08/25/2020 12:16 PM

## 2020-08-25 NOTE — Plan of Care (Signed)
Patient moved from ED to floor Alert and oriented, eating Breakfast

## 2020-08-25 NOTE — Telephone Encounter (Signed)
Chart reviewed for initial PC visit, noted Ms. Ana Washington was in ED with pending admission; attempted to contact Mr. Ana Washington, no answer with message left with contact information

## 2020-08-25 NOTE — Progress Notes (Signed)
[facility]) Hospital Liaison note:  This patient is currently enrolled in [facility] outpatient-based Palliative Care. Will continue to follow for disposition.  Please call with any outpatient palliative questions or concerns.  Thank you, Lorelee Market, LPN [facility] Liaison [phone]

## 2020-08-25 NOTE — Consult Note (Signed)
Consultation Note Date: 08/25/2020   Patient Name: Ana Washington  DOB: [birth year]  MRN: [id number]  Age / Sex: 75 y.o., female  PCP: [name], MD Referring Physician: Sidney Ace, MD  Reason for Consultation: Establishing goals of care  HPI/Patient Profile: Ana Washington is a 75 y.o. female with medical history significant for , nonischemic cardiomyopathy, diastolic CHF, last EF 50 to 55% 03/2020, Takotsubo cardiomyopathy, HTN, paroxysmal A. 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 fib, who presents with an unintentional overdose of morphine, reversed with Narcan by EMS.  Clinical Assessment and Goals of Care: Patient is resting in bed. She states she lives at home with her husband who is disabled.  Patient states she has had weakness for some time. She has [employer] that comes to her home to work with her. She uses a a walker. She advises in June, she had covid and noticed an acute decline with great weakness and worsening SOB. She states this is what led to her using her old Morphine.   We discussed her diagnosis, prognosis, GOC, EOL wishes disposition and options.  A detailed discussion was had today regarding advanced directives.  Concepts specific to code status, artifical feeding and hydration, IV antibiotics and rehospitalization were discussed.  The difference between an aggressive medical intervention path and a comfort care path was discussed.  Values and goals of care important to patient and family were attempted to be elicited.  Discussed limitations of medical interventions to prolong quality of life in some situations and discussed the concept of human mortality.  She states she was under hospice care years ago, but she outlived  their timelines. She states the expired Morphine helped with her SOB prior to this admission, but states it appears it was too much on an empty stomach as family was unable to wake her. She states she would like input from her pulmonologist on plans moving forward. She likes working with [facility] PT to gain strength.  She is currently DNR/DNI, no feeding tube. Treat the treatable.      SUMMARY OF RECOMMENDATIONS   Recommend outpatient palliative to follow.        Primary Diagnoses: Present on Admission:  Coronary artery disease involving native coronary artery of native heart without angina pectoris  Essential hypertension  Atrial fibrillation, chronic (HCC)  Chronic respiratory failure with hypoxia (HCC)  Bronchiectasis with (acute) exacerbation ([place])   I have reviewed the medical record, interviewed the patient and family, and examined the patient. The following aspects are pertinent.  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   Family history of adverse reaction to anesthesia    sister [name]   GERD (gastroesophageal reflux disease)  Headache    MIGRAINES   Myocardial infarction ([place]) 2007   Non-STEMI   [name] (postoperative nausea and vomiting)    Psoriasis    PUD (peptic ulcer disease)    Social History   Socioeconomic History   Marital status: Married    Spouse name: Not on file   Number of children: Not on file   Years of education: Not on file   Highest education level: Not on file  Occupational History   Not on file  Tobacco Use   Smoking status: Never   Smokeless tobacco: Never  Vaping Use   Vaping Use: Never used  Substance and Sexual Activity   Alcohol use: No   Drug use: No   Sexual activity: Not on file  Other Topics Concern   Not on file  Social History Narrative   Not on file    Social Determinants of Health   Financial Resource Strain: Not on file  Food Insecurity: Not on file  Transportation Needs: Not on file  Physical Activity: Not on file  Stress: Not on file  Social Connections: Not on file   Family History  Problem Relation Age of Onset   Hypertension Mother    Hypertension Father    Scheduled Meds:  amiodarone  200 mg Oral BID   apixaban  5 mg Oral BID   dicyclomine  20 mg Oral TID AC   digoxin  125 mcg Oral Daily   ferrous sulfate  325 mg Oral Daily   [START ON 08/26/2020] furosemide  40 mg Intravenous Daily   gabapentin  300 mg Oral QHS   metoprolol tartrate  25 mg Oral TID   pantoprazole  40 mg Oral Daily   predniSONE  10 mg Oral Daily   Continuous Infusions:  promethazine (PHENERGAN) injection (IM or IVPB)     PRN Meds:.acetaminophen **OR** acetaminophen, benzonatate, diltiazem, Ipratropium-Albuterol, metoprolol tartrate, ondansetron **OR** ondansetron (ZOFRAN) IV, polyethylene glycol, promethazine (PHENERGAN) injection (IM or IVPB) Medications Prior to Admission:  Prior to Admission medications   Medication Sig Start Date End Date Taking? Authorizing Provider  acetaminophen (TYLENOL) 500 MG tablet Take 1,000 mg by mouth every 6 (six) hours as needed for mild pain or headache.    Yes [provider]  acetylcysteine (MUCOMYST) 20 % nebulizer solution Take 4 mLs by nebulization daily. 04/22/20  Yes [provider]  amiodarone (PACERONE) 200 MG tablet Take 1 tablet (200 mg total) by mouth 2 (two) times daily. 08/07/20 09/06/20 Yes Val Riles, MD  apixaban (ELIQUIS) 5 MG TABS tablet Take 5 mg by mouth 2 (two) times daily.    Yes [provider]  Ascorbic Acid (VITAMIN C) 1000 MG tablet Take 1,000 mg by mouth daily.   Yes [provider]  benzonatate (TESSALON) 200 MG capsule Take 200 mg by mouth 3 (three) times daily as needed. 04/02/20  Yes [provider]  COMBIVENT RESPIMAT 20-100 MCG/ACT AERS  respimat 1 puff every 6 (six) hours as needed for wheezing. 06/09/20  Yes [provider]  digoxin (LANOXIN) 0.125 MG tablet Take 125 mcg by mouth daily. 05/11/20  Yes [provider]  ferrous sulfate 325 (65 FE) MG EC tablet Take 325 mg by mouth daily.    Yes [provider]  furosemide (LASIX) 20 MG tablet Take 20 mg by mouth daily as needed for fluid (for weight gain from fluid of more than 3 pounds.). 08/11/20  Yes [provider]  gabapentin (NEURONTIN) 300 MG capsule Take 300  mg by mouth at bedtime as needed.   Yes [provider]  glycopyrrolate (ROBINUL) 1 MG tablet Take 1 mg by mouth 3 (three) times daily.   Yes [provider]  [facility]) OINT Apply 1 application topically as needed. 08/17/20  Yes [provider]  levalbuterol (XOPENEX) 0.31 MG/3ML nebulizer solution Inhale 3 mLs into the lungs every 6 (six) hours as needed. 12/10/19  Yes [provider]  LORazepam (ATIVAN) 0.5 MG tablet Take 1 tablet (0.5 mg total) by mouth at bedtime. 09/10/19  Yes Enzo Bi, MD  metoprolol tartrate (LOPRESSOR) 25 MG tablet Take 1 tablet (25 mg total) by mouth 2 (two) times daily. 08/07/20 09/06/20 Yes Val Riles, MD  NAC 600 MG CAPS Take 600 mg by mouth daily after breakfast.  08/17/19  Yes [provider]  omeprazole (PRILOSEC) 20 MG capsule Take 20 mg by mouth daily. 08/26/19  Yes [provider]  polyethylene glycol (MIRALAX / GLYCOLAX) 17 g packet Take 17 g by mouth daily as needed for severe constipation. 05/02/20  Yes Wieting, Richard, MD  potassium chloride (KLOR-CON) 10 MEQ tablet Take 10 mEq by mouth daily. 08/16/20  Yes [provider]  predniSONE (DELTASONE) 10 MG tablet Take 10 mg by mouth daily. 08/07/20  Yes [provider]  Probiotic Product (ALIGN) 4 MG CAPS Take 4 mg by mouth daily.   Yes [provider]  senna (SENOKOT) 8.6 MG TABS tablet Take 2 tablets (17.2 mg  total) by mouth at bedtime. Patient taking differently: Take 1-2 tablets by mouth daily as needed. 05/02/20  Yes Wieting, Richard, MD  Sodium Chloride, Inhalant, 7 % NEBU Inhale 4 mLs into the lungs daily.   Yes [provider]  sulfamethoxazole-trimethoprim (BACTRIM) 400-80 MG tablet Take 1 tablet by mouth 3 (three) times a week. 08/18/20  Yes [provider]  predniSONE (STERAPRED UNI-PAK 21 TAB) 10 MG (21) TBPK tablet 40 mg p.o. daily x 2 days, 30 mg p.o. daily x2 days, 20 mg p.o. daily x 2 days, 10 mg p.o. daily x3 days Patient not taking: Reported on 08/24/2020 08/07/20   Val Riles, MD   Allergies  Allergen Reactions   Codeine Nausea And Vomiting   Sulfa Antibiotics Diarrhea and Anxiety    "Makes her feel shaky."   Penicillins Rash    Has patient had a PCN reaction causing immediate rash, facial/tongue/throat swelling, SOB or lightheadedness with hypotension: Unknown Has patient had a PCN reaction causing severe rash involving mucus membranes or skin necrosis: Unknown Has patient had a PCN reaction that required hospitalization: Unknown Has patient had a PCN reaction occurring within the last 10 years: No If all of the above answers are "NO", then may proceed with Cephalosporin use.    Review of Systems  All other systems reviewed and are negative.  Physical Exam Pulmonary:     Effort: Pulmonary effort is normal.  Neurological:     Mental Status: She is alert.    Vital Signs: BP 110/81 (BP Location: Left Arm)   Pulse 87   Temp 97.8 F (36.6 C) (Oral)   Resp 16   Ht _0  (1.651 m)   Wt 50.1 kg   SpO2 100%   BMI 18.38 kg/m  Pain Scale: 0-10   Pain Score: 5    SpO2: SpO2: 100 % O2 Device:SpO2: 100 % O2 Flow Rate: .O2 Flow Rate (L/min): 3 L/min  IO: Intake/output summary:  Intake/Output Summary (Last 24 hours) at 08/25/2020  1357 Last data filed at 08/25/2020 1328 Gross per 24 hour  Intake 360 ml  Output 1150 ml  Net -790 ml    LBM: Last BM  Date: 08/23/20 Baseline Weight: Weight: 50.1 kg Most recent weight: Weight: 50.1 kg       Time In: 10:00 Time Out: 10:40 Time Total: 40 min Greater than 50%  of this time was spent counseling and coordinating care related to the above assessment and plan.  Signed by: Asencion Gowda, NP   Please contact Palliative Medicine Team phone at [phone] for questions and concerns.  For individual provider: See Shea Evans

## 2020-08-25 NOTE — Evaluation (Signed)
Occupational Therapy Evaluation Patient Details Name: Ana Washington MRN: [id number] DOB: May 06, 1945 Today's Date: 08/25/2020    History of Present Illness Ana Washington is a 75 y.o. female with a history of nonischemic cardiomyopathy, diastolic CHF, last EF 65 to 55% 03/2020, Takotsubo cardiomyopathy, Afib, HTN, MI, PUD, lumbar compression fractures, thoracic compression fractures, chronic MAC infection with bronchiectasis on 3 L nasal cannula at home, recent multiple hospitalizations due to COVID-19 infection, SOB. Pt admitted unintentional overdose of morphine, reversed with Narcan by EMS.   Clinical Impression   Pt seen for OT evaluation this date. Pt was requiring assist from a PCA for tub transfers, bathing, laundry, making beds, breakfast, and lunch. Her sister provided transportation as needed. Pt reports recently working with [place] and [place] as well. She was walking with a rollator, per pt report before this admission. Pt is on 2 liters of O2 at home per her report. Found to be on 3-3.5L O2 and pt requested OT turn back down to 2L. RN notified of change. Pt performed bed mobility with supervision for safety and noted effort to perform. Pt tolerated sitting EOB to comb her hair requiring MIN A for R elbow support 2/2 decreased R shoulder AROM which she reports is not new. Pt required CGA for ADL transfers from elevated bed + RW. Took a few steps EOB + RW + CGA. Back to bed and pt endorsed 4/10 PRE, was 2/10 PRE at start of session. Cues intermittently for PLB.  Pt currently requires MIN A for UB ADL, MOD A for LB ADL due to current functional impairments in strength, activity tolerance, balance, and SOB (See OT Problem List below). Pt educated in PLB and role of OT to address energy conservation strategies. Pt would benefit from additional skilled OT services to maximize recall and carryover of learned techniques and facilitate implementation of learned techniques into daily routines. Upon  discharge, recommend [place] services pending additional progress towards OT goals.    Follow Up Recommendations  Home health OT;Supervision - Intermittent    Equipment Recommendations  None recommended by OT    Recommendations for Other Services       Precautions / Restrictions Precautions Precautions: Fall Restrictions Weight Bearing Restrictions: No      Mobility Bed Mobility Overal bed mobility: Needs Assistance Bed Mobility: Supine to Sit;Sit to Supine     Supine to sit: Supervision Sit to supine: Modified independent (Device/Increase time)   General bed mobility comments: increased effort/exertion    Transfers Overall transfer level: Needs assistance Equipment used: Rolling walker (2 wheeled) Transfers: Sit to/from Stand Sit to Stand: From elevated surface;Min guard         General transfer comment: cues for PLB    Balance Overall balance assessment: Needs assistance Sitting-balance support: No upper extremity supported;Feet supported Sitting balance-Leahy Scale: Good     Standing balance support: Bilateral upper extremity supported;During functional activity Standing balance-Leahy Scale: Fair                             ADL either performed or assessed with clinical judgement   ADL Overall ADL's : Needs assistance/impaired                                       General ADL Comments: Pt requires set up to MIN A for seated UB bathing/dressing, set up  and MIN A for RUE elbow support to comb hair 2/2 decreased shoulder flexion AROM, MOD A for LB ADL, increased SOB with exertion. CGA for ADL transfers from elevated surface + RW. VC for PLB.     Vision Baseline Vision/History: Wears glasses Wears Glasses: At all times Patient Visual Report: No change from baseline       Perception     Praxis      Pertinent Vitals/Pain Pain Assessment: 0-10 Pain Score: 4  Pain Location: abdominal discomfort, chronic per pt report Pain  Descriptors / Indicators: Aching Pain Intervention(s): Limited activity within patient's tolerance;Monitored during session;Repositioned     Hand Dominance Right   Extremity/Trunk Assessment Upper Extremity Assessment Upper Extremity Assessment: Generalized weakness   Lower Extremity Assessment Lower Extremity Assessment: Generalized weakness   Cervical / Trunk Assessment Cervical / Trunk Assessment: Normal   Communication Communication Communication: No difficulties   Cognition Arousal/Alertness: Awake/alert Behavior During Therapy: WFL for tasks assessed/performed Overall Cognitive Status: Within Functional Limits for tasks assessed                                     General Comments  Pt on 3L O2 at start of session, down to 2L O2 per pt request as this is per baseline and RN earlier only bumped her up for toilet transfer. SpO2 >95% on 2L throughout exertional activity. HR up to 90's with exertion.    Exercises     Shoulder Instructions      Home Living Family/patient expects to be discharged to:: Private residence Living Arrangements: Spouse/significant other Available Help at Discharge: Family;Available 24 hours/day;Personal care attendant (PCA 9a-1p: helps with laundry, makes beds, breakfast, lunch, cleaning, tub transfers, and bathing. Sister provides transportation. Son provides dinner.) Type of Home: House Home Access: Level entry     Home Layout: One level     Bathroom Shower/Tub: Teacher, early years/pre: Handicapped height     Home Equipment: Environmental consultant - 2 wheels;Bedside commode;Tub bench;Wheelchair - Comptroller - 4 wheels   Additional Comments: Her sister lives close and assists, patient helps husband who is disabled      Prior Functioning/Environment Level of Independence: Needs assistance  Gait / Transfers Assistance Needed: pt reports ambulating wtih rollator ADL's / Homemaking Assistance Needed: PCA  9a-1p: helps with laundry, makes beds, breakfast, lunch, cleaning, tub transfers, and bathing. Sister provides transportation. Son provides dinner.   Comments: pt denies recent falls        OT Problem List: Decreased strength;Decreased activity tolerance;Impaired balance (sitting and/or standing);Cardiopulmonary status limiting activity      OT Treatment/Interventions: Self-care/ADL training;Therapeutic exercise;Patient/family education;Balance training;Neuromuscular education;Energy conservation;Therapeutic activities;DME and/or AE instruction    OT Goals(Current goals can be found in the care plan section) Acute Rehab OT Goals Patient Stated Goal: go home OT Goal Formulation: With patient Time For Goal Achievement: 09/08/20 Potential to Achieve Goals: Good ADL Goals Pt Will Perform Lower Body Dressing: sit to/from stand;with min guard assist Pt Will Transfer to Toilet: with supervision;ambulating;bedside commode (c LRAD) Additional ADL Goal #1: Pt will verbalize plan to implement at least 3 ECS into ADL routines to maximize safety/indep and minimize SOB.  OT Frequency: Min 2X/week   Barriers to D/C:            Co-evaluation              AM-PAC OT "6 Clicks" Daily Activity  Outcome Measure Help from another person eating meals?: None Help from another person taking care of personal grooming?: A Little Help from another person toileting, which includes using toliet, bedpan, or urinal?: A Little Help from another person bathing (including washing, rinsing, drying)?: A Little Help from another person to put on and taking off regular upper body clothing?: A Little Help from another person to put on and taking off regular lower body clothing?: A Lot 6 Click Score: 18   End of Session Equipment Utilized During Treatment: Rolling walker;Oxygen Nurse Communication: Mobility status;Other (comment) (pt requests RN assess "bed sore" and also endorses burning sensation when she  urinates. No BM since Saturday.)  Activity Tolerance: Patient tolerated treatment well Patient left:    OT Visit Diagnosis: Unsteadiness on feet (R26.81);Muscle weakness (generalized) (M62.81)                Time: 3428-7681 OT Time Calculation (min): 19 min Charges:  OT General Charges $OT Visit: 1 Visit OT Evaluation $OT Eval Moderate Complexity: 1 Mod OT Treatments $Self Care/Home Management : 8-22 mins  Hanley Hays, MPH, MS, OTR/L ascom [phone] 08/25/20, 4:15 PM

## 2020-08-25 NOTE — Progress Notes (Signed)
PROGRESS NOTE    Ana Washington  LZJ:[id number] DOB: Sep 19, 1945 DOA: 08/23/2020 PCP: [name], MD   Brief Narrative:   75 y.o. female with medical history significant for , nonischemic cardiomyopathy, diastolic CHF, last EF 50 to 55% 03/2020, Takotsubo cardiomyopathy, HTN, paroxysmal A. 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 fib, who presents with an unintentional overdose of morphine, reversed with Narcan by EMS.  Patient reports that she had been having a headache that was severe and felt worsening shortness of breath and asked that her sister administer some leftover morphine that she had.  She became obtunded after administration of morphine and EMS was called out.  She returned to normal mentation following a dose of Narcan but was subsequently brought to the ED.  She continued to have shortness of breath but denied chest pain.  Cough is at baseline.  Had no fever or chills.  Denied nausea, vomiting, abdominal pain or diarrhea.  On admission she was in rapid atrial fibrillation.  Cardiology consulted.  Recommendations appreciated.  Currently well rate controlled on amiodarone, metoprolol, digoxin.  Continues to endorse shortness of breath.  Diuresing effectively.  Rate controlled on amiodarone, metoprolol, digoxin  Assessment & Plan:   Principal Problem:   Morphine Overdose, accidental or unintentional, initial encounter Active Problems:   Essential hypertension   Coronary artery disease involving native coronary artery of native heart without angina pectoris   Non-ischemic cardiomyopathy ([facility])   Long term current use of opiate analgesic   Chronic anticoagulation (ELIQUIS)   Atrial fibrillation, chronic (HCC)   Bronchiectasis with (acute) exacerbation (HCC)   Chronic respiratory failure with hypoxia (HCC)   Chronic  hyponatremia   Acute on chronic diastolic CHF (congestive heart failure) (HCC)   CHF (congestive heart failure) (HCC)   Morphine Overdose, accidental or unintentional, Patient took concentrated morphine given to her by hospice.  She became obtunded and required admission to ED.  Status post Narcan mental status returned to baseline.   Acute dyspnea Chronic respiratory failure with hypoxia - Multifactorial related to exacerbations of bronchiectasis, diastolic heart failure and depression from morphine overdose - No increasing oxygen needs.  Saturating in the 90s on 3 L - Continues to endorse shortness of breath.  Likely multifactorial in origin   Bronchiectasis with (acute) exacerbation (HCC) - DuoNebs/Xopenex every 6 and as needed - Continue acetylcysteine nebulizer solution - Continue Tessalon   Acute on chronic diastolic CHF (congestive heart failure) Nonischemic cardiomyopathy -BNP > 1400.  Baseline in the 300s.  No pulmonary edema on chest x-ray - Patient reports she held off her diuretic for a few days recently -Continues to endorse shortness of breath -Cautious diuresis.  Patient becoming alkalotic Plan: Lasix 40 mg once daily Daily weights, strict I's and O's   Atrial fibrillation with rapid ventricular response - Ventricular rates improved after addition of metoprolol, digoxin, amiodarone Cardiology consulted Plan: Continue current regimen of beta-blocker, amiodarone, digoxin Slowly uptitrate beta-blocker and amiodarone to achieve good rate control Currently controlled   Chronic pain - Continue gabapentin     Chronic hyponatremia with mild worsening - Sodium 125, baseline 129-131 - Suspect hypervolemic - Follow urine and sodium osmolality -Cautious while on diuretics  Patient is chronically ill and is well-known to the hospitalist, cardiology, pulmonary service.  She has had frequent readmissions and overall prognosis remains very poor.  Apparently she  was  discharged and did engage hospice services however was felt to be too well for hospice services and was subsequently discharged.  I will reengage palliative care for further discussion regarding goals of care.  We will attempt to medically optimize   DVT prophylaxis: Eliquis Code Status: DNR Family Communication: Daughter bedside 7/25 Disposition Plan:Status is: Inpatient  Remains inpatient appropriate because:Inpatient level of care appropriate due to severity of illness  Dispo: The patient is from: Home              Anticipated d/c is to: Home              Patient currently is not medically stable to d/c.   Difficult to place patient No  We will request therapy evaluations and attempt to medically optimize for tentative discharge on 7/27    Level of care: Progressive Cardiac  Consultants:  Cardiology  Procedures:  None  Antimicrobials:  None   Subjective: Patient seen and examined.  Endorses shortness of breath.  No pain complaints  Objective: Vitals:   08/25/20 0630 08/25/20 0700 08/25/20 0730 08/25/20 1219  BP: (!) 127/96 119/77 139/85 110/81  Pulse: 70 62 62 87  Resp: _0 Temp:    97.8 F (36.6 C)  TempSrc:    Oral  SpO2: 100% 100% 100% 100%  Weight:      Height:        Intake/Output Summary (Last 24 hours) at 08/25/2020 1340 Last data filed at 08/25/2020 1328 Gross per 24 hour  Intake 360 ml  Output 1150 ml  Net -790 ml   Filed Weights   08/23/20 2010  Weight: 50.1 kg    Examination:  General exam: No acute distress.  Frail and chronically ill Respiratory system: Diffuse crackles bilaterally.  Normal work of breathing.  3 L Cardiovascular system: S1-S2, regular rate, irregular rhythm, no murmurs Gastrointestinal system: Thin, nondistended, mild TTP epigastrium, normal bowel sounds Central nervous system: Alert and oriented. No focal neurological deficits. Extremities: Symmetric 5 x 5 power. Skin: No rashes, lesions or  ulcers Psychiatry: Judgement and insight appear normal. Mood & affect appropriate.     Data Reviewed: I have personally reviewed following labs and imaging studies  CBC: Recent Labs  Lab 08/23/20 2013 08/25/20 0912  WBC 21.8* 12.7*  NEUTROABS 20.1* 8.9*  HGB 11.6* 11.9*  HCT 33.9* 34.0*  MCV 100.9* 97.1  PLT 282 165   Basic Metabolic Panel: Recent Labs  Lab 08/23/20 2013 08/25/20 0912  NA 123* 125*  K 4.8 3.4*  CL 82* 73*  CO2 34* 40*  GLUCOSE 271* 77  BUN 11 12  CREATININE 0.55 0.60  CALCIUM 8.1* 8.4*   GFR: Estimated Creatinine Clearance: 47.3 mL/min (by C-G formula based on SCr of 0.6 mg/dL). Liver Function Tests: No results for input(s): AST, ALT, ALKPHOS, BILITOT, PROT, ALBUMIN in the last 168 hours. No results for input(s): LIPASE, AMYLASE in the last 168 hours. No results for input(s): AMMONIA in the last 168 hours. Coagulation Profile: No results for input(s): INR, PROTIME in the last 168 hours. Cardiac Enzymes: No results for input(s): CKTOTAL, CKMB, CKMBINDEX, TROPONINI in the last 168 hours. BNP (last 3 results) No results for input(s): PROBNP in the last 8760 hours. HbA1C: No results for input(s): HGBA1C in the last 72 hours. CBG: No results for input(s): GLUCAP in the last 168 hours. Lipid Profile: No results for input(s): CHOL, HDL, LDLCALC, TRIG, CHOLHDL, LDLDIRECT in the last 72 hours. Thyroid  Function Tests: No results for input(s): TSH, T4TOTAL, FREET4, T3FREE, THYROIDAB in the last 72 hours. Anemia Panel: No results for input(s): VITAMINB12, FOLATE, FERRITIN, TIBC, IRON, RETICCTPCT in the last 72 hours. Sepsis Labs: No results for input(s): PROCALCITON, LATICACIDVEN in the last 168 hours.  Recent Results (from the past 240 hour(s))  Resp Panel by RT-PCR (Flu A&B, Covid) Nasopharyngeal Swab     Status: Abnormal   Collection Time: 08/24/20 11:12 AM   Specimen: Nasopharyngeal Swab; Nasopharyngeal(NP) swabs in vial transport medium  Result  Value Ref Range Status   SARS Coronavirus 2 by RT PCR POSITIVE (A) NEGATIVE Final    Comment: CRITICAL RESULT CALLED TO, READ BACK BY AND VERIFIED WITH:  Reyes Ivan, RN AT 1224 08/24/2020. GAA. (NOTE) SARS-CoV-2 target nucleic acids are DETECTED.  The SARS-CoV-2 RNA is generally detectable in upper respiratory specimens during the acute phase of infection. Positive results are indicative of the presence of the identified virus, but do not rule out bacterial infection or co-infection with other pathogens not detected by the test. Clinical correlation with patient history and other diagnostic information is necessary to determine patient infection status. The expected result is Negative.  Fact Sheet for Patients: EntrepreneurPulse.com.au  Fact Sheet for Healthcare Providers: IncredibleEmployment.be  This test is not yet approved or cleared by the [place] FDA and  has been authorized for detection and/or diagnosis of SARS-CoV-2 by FDA under an Emergency Use Authorization (EUA).  This EUA will remain in effect (meani ng this test can be used) for the duration of  the COVID-19 declaration under Section 564(b)(1) of the Act, 21 U.S.C. section 360bbb-3(b)(1), unless the authorization is terminated or revoked sooner.     Influenza A by PCR NEGATIVE NEGATIVE Final   Influenza B by PCR NEGATIVE NEGATIVE Final    Comment: (NOTE) The Xpert Xpress SARS-CoV-2/FLU/RSV plus assay is intended as an aid in the diagnosis of influenza from Nasopharyngeal swab specimens and should not be used as a sole basis for treatment. Nasal washings and aspirates are unacceptable for Xpert Xpress SARS-CoV-2/FLU/RSV testing.  Fact Sheet for Patients: EntrepreneurPulse.com.au  Fact Sheet for Healthcare Providers: IncredibleEmployment.be  This test is not yet approved or cleared by the [place] FDA and has been authorized  for detection and/or diagnosis of SARS-CoV-2 by FDA under an Emergency Use Authorization (EUA). This EUA will remain in effect (meaning this test can be used) for the duration of the COVID-19 declaration under Section 564(b)(1) of the Act, 21 U.S.C. section 360bbb-3(b)(1), unless the authorization is terminated or revoked.  Performed at [facility], [address] [place], [place] [address]          Radiology Studies: CT Head Wo Contrast  Result Date: 08/23/2020 CLINICAL DATA:  Headache.  Suspect intracranial hemorrhage. EXAM: CT HEAD WITHOUT CONTRAST TECHNIQUE: Contiguous axial images were obtained from the base of the skull through the vertex without intravenous contrast. COMPARISON:  None. FINDINGS: Brain: No evidence of acute infarction, hemorrhage, hydrocephalus, extra-axial collection or mass lesion/mass effect. Mild diffuse cerebral atrophy. Low-attenuation changes in the deep white matter consistent with small vessel ischemia. Vascular: Moderate intracranial arterial vascular calcifications. Skull: Calvarium appears intact. Sinuses/Orbits: Small retention cyst in the right maxillary antrum. 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 Paranasal sinuses and mastoid air cells are otherwise clear. Other: None. IMPRESSION: No acute intracranial abnormalities. Chronic atrophy and small vessel ischemic changes. Electronically Signed   By: Lucienne Capers M.D.   On: 08/23/2020 21:59  DG Chest Port 1 View  Result Date: 08/23/2020 CLINICAL DATA:  Short of breath. EXAM: PORTABLE CHEST 1 VIEW COMPARISON:  08/02/2020 and older exams.  CT, 07/30/2020. FINDINGS: Cardiac silhouette is mostly obscured. There is bi basilar opacity consistent with small effusions. Post lung surgery changes are noted with left perihilar pulmonary anastomosis staples. There are also pulmonary anastomosis staples in the right upper lung extending from the right hilum. Lungs  demonstrate prominent vascular and thickened interstitial markings similar to the prior exam. Additional opacity is noted at the left lung base consistent with atelectasis. No pneumothorax. No mediastinal or hilar masses. Numerous vertebral fractures have been treated with vertebroplasty/kyphoplasty, unchanged. IMPRESSION: 1. Findings are similar to the most recent prior chest radiograph. No convincing pneumonia or pulmonary edema. 2. Bilateral lung base opacities consistent with small effusions, greater on the left. Left greater than right basilar atelectasis. 3. Significant chronic interstitial lung disease. Stable changes from prior bilateral lung surgery. Electronically Signed   By: Lajean Manes M.D.   On: 08/23/2020 20:57        Scheduled Meds:  amiodarone  200 mg Oral BID   apixaban  5 mg Oral BID   dicyclomine  20 mg Oral TID AC   digoxin  125 mcg Oral Daily   ferrous sulfate  325 mg Oral Daily   [START ON 08/26/2020] furosemide  40 mg Intravenous Daily   gabapentin  300 mg Oral QHS   metoprolol tartrate  25 mg Oral TID   pantoprazole  40 mg Oral Daily   predniSONE  10 mg Oral Daily   Continuous Infusions:  promethazine (PHENERGAN) injection (IM or IVPB)       LOS: 1 day    Time spent: 25 minutes    Sidney Ace, MD [facility] Pager [phone]  If 7PM-7AM, please contact night-coverage  08/25/2020, 1:40 PM

## 2020-08-26 DIAGNOSIS — Z7189 Other specified counseling: Secondary | ICD-10-CM | POA: Diagnosis not present

## 2020-08-26 DIAGNOSIS — T50901A Poisoning by unspecified drugs, medicaments and biological substances, accidental (unintentional), initial encounter: Secondary | ICD-10-CM | POA: Diagnosis not present

## 2020-08-26 LAB — BASIC METABOLIC PANEL
Anion gap: 11 (ref 5–15)
BUN: 14 mg/dL (ref 8–23)
CO2: 37 mmol/L — ABNORMAL HIGH (ref 22–32)
Calcium: 8.3 mg/dL — ABNORMAL LOW (ref 8.9–10.3)
Chloride: 76 mmol/L — ABNORMAL LOW (ref 98–111)
Creatinine, Ser: 0.61 mg/dL (ref 0.44–1.00)
GFR, Estimated: >60 mL/min (ref >60)
Glucose, Bld: 78 mg/dL (ref 70–99)
Potassium: 3.9 mmol/L (ref 3.5–5.1)
Sodium: 124 mmol/L — ABNORMAL LOW (ref 135–145)

## 2020-08-26 LAB — CBC WITH DIFFERENTIAL/PLATELET
Abs Immature Granulocytes: 0.16 10*3/uL — ABNORMAL HIGH (ref 0.00–0.07)
Basophils Absolute: 0.1 10*3/uL (ref 0.0–0.1)
Basophils Relative: 1 %
Eosinophils Absolute: 0.1 10*3/uL (ref 0.0–0.5)
Eosinophils Relative: 1 %
HCT: 32.1 % — ABNORMAL LOW (ref 36.0–46.0)
Hemoglobin: 11.4 g/dL — ABNORMAL LOW (ref 12.0–15.0)
Immature Granulocytes: 2 %
Lymphocytes Relative: 17 %
Lymphs Abs: 1.8 10*3/uL (ref 0.7–4.0)
MCH: 33.9 pg (ref 26.0–34.0)
MCHC: 35.5 g/dL (ref 30.0–36.0)
MCV: 95.5 fL (ref 80.0–100.0)
Monocytes Absolute: 0.9 10*3/uL (ref 0.1–1.0)
Monocytes Relative: 9 %
Neutro Abs: 7.8 10*3/uL — ABNORMAL HIGH (ref 1.7–7.7)
Neutrophils Relative %: 70 %
Platelets: 231 10*3/uL (ref 150–400)
RBC: 3.36 MIL/uL — ABNORMAL LOW (ref 3.87–5.11)
RDW: 12.9 % (ref 11.5–15.5)
WBC: 10.8 10*3/uL — ABNORMAL HIGH (ref 4.0–10.5)
nRBC: 0 % (ref 0.0–0.2)

## 2020-08-26 LAB — URINALYSIS, COMPLETE (UACMP) WITH MICROSCOPIC
Bilirubin Urine: NEGATIVE
Glucose, UA: NEGATIVE mg/dL
Ketones, ur: NEGATIVE mg/dL
Leukocytes,Ua: NEGATIVE
Nitrite: NEGATIVE
Protein, ur: NEGATIVE mg/dL
RBC / HPF: >50 RBC/hpf — ABNORMAL HIGH (ref 0–5)
Specific Gravity, Urine: 1.008 (ref 1.005–1.030)
pH: 9 — ABNORMAL HIGH (ref 5.0–8.0)

## 2020-08-26 LAB — MAGNESIUM: Magnesium: 1.6 mg/dL — ABNORMAL LOW (ref 1.7–2.4)

## 2020-08-26 MED ORDER — APIXABAN 2.5 MG PO TABS
2.5000 mg | ORAL_TABLET | Freq: Two times a day (BID) | ORAL | Status: DC
  Administered 2020-08-26 – 2020-08-27 (×3): 2.5 mg via ORAL
  Filled 2020-08-26 (×3): qty 1

## 2020-08-26 NOTE — Progress Notes (Signed)
PROGRESS NOTE    Ana Washington  BWI:[id number] DOB: 1945-05-23 DOA: 08/23/2020 PCP: [name], MD   Brief Narrative:   75 y.o. female with medical history significant for , nonischemic cardiomyopathy, diastolic CHF, last EF 50 to 55% 03/2020, Takotsubo cardiomyopathy, HTN, paroxysmal A. 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 fib, who presents with an unintentional overdose of morphine, reversed with Narcan by EMS.  Patient reports that she had been having a headache that was severe and felt worsening shortness of breath and asked that her sister administer some leftover morphine that she had.  She became obtunded after administration of morphine and EMS was called out.  She returned to normal mentation following a dose of Narcan but was subsequently brought to the ED.  She continued to have shortness of breath but denied chest pain.  Cough is at baseline.  Had no fever or chills.  Denied nausea, vomiting, abdominal pain or diarrhea.  On admission she was in rapid atrial fibrillation.  Cardiology consulted.  Recommendations appreciated.  Currently well rate controlled on amiodarone, metoprolol, digoxin.  Continues to endorse shortness of breath.  Diuresing effectively.  Rate controlled on amiodarone, metoprolol, digoxin  Assessment & Plan:   Principal Problem:   Morphine Overdose, accidental or unintentional, initial encounter Active Problems:   Essential hypertension   Coronary artery disease involving native coronary artery of native heart without angina pectoris   Non-ischemic cardiomyopathy ([place])   Long term current use of opiate analgesic   Chronic anticoagulation (ELIQUIS)   Atrial fibrillation, chronic (HCC)   Bronchiectasis with (acute) exacerbation (HCC)   Chronic respiratory failure with hypoxia (HCC)   Chronic  hyponatremia   Acute on chronic diastolic CHF (congestive heart failure) (HCC)   CHF (congestive heart failure) (HCC)   Morphine Overdose, accidental or unintentional, Patient took concentrated morphine given to her by hospice.  She became obtunded and required admission to ED.  Status post Narcan mental status returned to baseline.   Acute dyspnea Chronic respiratory failure with hypoxia - Multifactorial related to exacerbations of bronchiectasis, diastolic heart failure and depression from morphine overdose - No increasing oxygen needs.  Saturating in the 90s on 3 L - Continues to endorse shortness of breath.  Likely multifactorial in origin Plan: Continue gentle diuresis.  Monitor urine output.  If respiratory status remains poor patient may for hospice care.  She does not want to return to the hospital.  Attempt to avoid contraction alkalosis.   Bronchiectasis with (acute) exacerbation (HCC) - DuoNebs/Xopenex every 6 and as needed - Continue acetylcysteine nebulizer solution - Continue Tessalon   Acute on chronic diastolic CHF (congestive heart failure) Nonischemic cardiomyopathy -BNP > 1400.  Baseline in the 300s.  No pulmonary edema on chest x-ray - Patient reports she held off her diuretic for a few days recently -Continues to endorse shortness of breath -Cautious diuresis.  Patient becoming alkalotic Plan: Lasix 40 mg once daily Daily weights, strict I's and O's   Atrial fibrillation with rapid ventricular response - Ventricular rates improved after addition of metoprolol, digoxin, amiodarone Cardiology consulted Plan: Continue current regimen of beta-blocker, amiodarone, digoxin Slowly uptitrate beta-blocker and amiodarone to achieve good rate control Currently controlled   Chronic pain - Continue gabapentin     Chronic hyponatremia with mild worsening - Sodium 125, baseline 129-131 - Suspect hypervolemic - Follow urine and  sodium osmolality -Cautious while on  diuretics  Patient is chronically ill and is well-known to the hospitalist, cardiology, pulmonary service.  She has had frequent readmissions and overall prognosis remains very poor.  She has made her wishes clear that she does not want to return to the hospital.  We will attempt to medically optimize and control symptoms.  Patient likely a good candidate for outpatient hospice services as she has made her wishes clear not to return the hospital.   DVT prophylaxis: Eliquis Code Status: DNR Family Communication: Daughter bedside 7/25 Disposition Plan:Status is: Inpatient  Remains inpatient appropriate because:Inpatient level of care appropriate due to severity of illness  Dispo: The patient is from: Home              Anticipated d/c is to: Home versus home with hospice              Patient currently is not medically stable to d/c.   Difficult to place patient No  Intent medical optimization for the next 24 hours.  Palliative care follow-up.  Will discharge home if respiratory status improves.  If not patient may qualify for home hospice services.   Level of care: Progressive Cardiac  Consultants:  Cardiology  Procedures:  None  Antimicrobials:  None   Subjective: Patient seen and examined.  Endorses shortness of breath.  No pain complaints  Objective: Vitals:   08/25/20 2050 08/26/20 0522 08/26/20 0828 08/26/20 1215  BP: (!) 127/96 (!) 144/86 (!) 152/96 (!) 145/79  Pulse: 89 (!) 58 (!) 57 (!) 55  Resp: _0 Temp: 98.7 F (37.1 C) 97.7 F (36.5 C) 98.4 F (36.9 C) 98 F (36.7 C)  TempSrc: Oral     SpO2: 100% 100% 100% 100%  Weight:  46.1 kg    Height:        Intake/Output Summary (Last 24 hours) at 08/26/2020 1540 Last data filed at 08/26/2020 0600 Gross per 24 hour  Intake 480 ml  Output 850 ml  Net -370 ml   Filed Weights   08/23/20 2010 08/26/20 0522  Weight: 50.1 kg 46.1 kg    Examination:  General exam: Mild respiratory distress.  Appears  frail Respiratory system: Coarse crackles bilaterally.  Increased work of breathing.  3 L Cardiovascular system: S1-S2, regular rate, irregular rhythm, no murmurs Gastrointestinal system: Thin, nondistended, mild TTP epigastrium, normal bowel sounds Central nervous system: Alert and oriented. No focal neurological deficits. Extremities: Symmetric 5 x 5 power. Skin: No rashes, lesions or ulcers Psychiatry: Judgement and insight appear normal. Mood & affect appropriate.     Data Reviewed: I have personally reviewed following labs and imaging studies  CBC: Recent Labs  Lab 08/23/20 2013 08/25/20 0912 08/26/20 0519  WBC 21.8* 12.7* 10.8*  NEUTROABS 20.1* 8.9* 7.8*  HGB 11.6* 11.9* 11.4*  HCT 33.9* 34.0* 32.1*  MCV 100.9* 97.1 95.5  PLT 282 244 299   Basic Metabolic Panel: Recent Labs  Lab 08/23/20 2013 08/25/20 0912 08/26/20 0519  NA 123* 125* 124*  K 4.8 3.4* 3.9  CL 82* 73* 76*  CO2 34* 40* 37*  GLUCOSE 271* 77 78  BUN _1 CREATININE 0.55 0.60 0.61  CALCIUM 8.1* 8.4* 8.3*  MG  --   --  1.6*   GFR: Estimated Creatinine Clearance: 43.5 mL/min (by C-G formula based on SCr of 0.61 mg/dL). Liver Function Tests: No results for input(s): AST, ALT, ALKPHOS, BILITOT, PROT, ALBUMIN in the last 168 hours.  No results for input(s): LIPASE, AMYLASE in the last 168 hours. No results for input(s): AMMONIA in the last 168 hours. Coagulation Profile: No results for input(s): INR, PROTIME in the last 168 hours. Cardiac Enzymes: No results for input(s): CKTOTAL, CKMB, CKMBINDEX, TROPONINI in the last 168 hours. BNP (last 3 results) No results for input(s): PROBNP in the last 8760 hours. HbA1C: No results for input(s): HGBA1C in the last 72 hours. CBG: No results for input(s): GLUCAP in the last 168 hours. Lipid Profile: No results for input(s): CHOL, HDL, LDLCALC, TRIG, CHOLHDL, LDLDIRECT in the last 72 hours. Thyroid Function Tests: No results for input(s): TSH, T4TOTAL,  FREET4, T3FREE, THYROIDAB in the last 72 hours. Anemia Panel: No results for input(s): VITAMINB12, FOLATE, FERRITIN, TIBC, IRON, RETICCTPCT in the last 72 hours. Sepsis Labs: No results for input(s): PROCALCITON, LATICACIDVEN in the last 168 hours.  Recent Results (from the past 240 hour(s))  Resp Panel by RT-PCR (Flu A&B, Covid) Nasopharyngeal Swab     Status: Abnormal   Collection Time: 08/24/20 11:12 AM   Specimen: Nasopharyngeal Swab; Nasopharyngeal(NP) swabs in vial transport medium  Result Value Ref Range Status   SARS Coronavirus 2 by RT PCR POSITIVE (A) NEGATIVE Final    Comment: CRITICAL RESULT CALLED TO, READ BACK BY AND VERIFIED WITH:  Reyes Ivan, RN AT 1224 08/24/2020. GAA. (NOTE) SARS-CoV-2 target nucleic acids are DETECTED.  The SARS-CoV-2 RNA is generally detectable in upper respiratory specimens during the acute phase of infection. Positive results are indicative of the presence of the identified virus, but do not rule out bacterial infection or co-infection with other pathogens not detected by the test. Clinical correlation with patient history and other diagnostic information is necessary to determine patient infection status. The expected result is Negative.  Fact Sheet for Patients: EntrepreneurPulse.com.au  Fact Sheet for Healthcare Providers: IncredibleEmployment.be  This test is not yet approved or cleared by the [place] FDA and  has been authorized for detection and/or diagnosis of SARS-CoV-2 by FDA under an Emergency Use Authorization (EUA).  This EUA will remain in effect (meani ng this test can be used) for the duration of  the COVID-19 declaration under Section 564(b)(1) of the Act, 21 U.S.C. section 360bbb-3(b)(1), unless the authorization is terminated or revoked sooner.     Influenza A by PCR NEGATIVE NEGATIVE Final   Influenza B by PCR NEGATIVE NEGATIVE Final    Comment: (NOTE) The Xpert Xpress  SARS-CoV-2/FLU/RSV plus assay is intended as an aid in the diagnosis of influenza from Nasopharyngeal swab specimens and should not be used as a sole basis for treatment. Nasal washings and aspirates are unacceptable for Xpert Xpress SARS-CoV-2/FLU/RSV testing.  Fact Sheet for Patients: EntrepreneurPulse.com.au  Fact Sheet for Healthcare Providers: IncredibleEmployment.be  This test is not yet approved or cleared by the [place] FDA and has been authorized for detection and/or diagnosis of SARS-CoV-2 by FDA under an Emergency Use Authorization (EUA). This EUA will remain in effect (meaning this test can be used) for the duration of the COVID-19 declaration under Section 564(b)(1) of the Act, 21 U.S.C. section 360bbb-3(b)(1), unless the authorization is terminated or revoked.  Performed at [facility], [address] [place], [place] [address]          Radiology Studies: No results found.      Scheduled Meds:  amiodarone  200 mg Oral BID   apixaban  2.5 mg Oral BID   dicyclomine  20 mg Oral TID AC  digoxin  125 mcg Oral Daily   ferrous sulfate  325 mg Oral Daily   furosemide  40 mg Intravenous Daily   gabapentin  300 mg Oral QHS   guaiFENesin  1,200 mg Oral BID   levalbuterol  2 puff Inhalation Q6H   metoprolol tartrate  25 mg Oral TID   pantoprazole  40 mg Oral Daily   predniSONE  10 mg Oral Daily   Continuous Infusions:  promethazine (PHENERGAN) injection (IM or IVPB)       LOS: 2 days    Time spent: 25 minutes    Sidney Ace, MD [facility] Pager [phone]  If 7PM-7AM, please contact night-coverage  08/26/2020, 3:40 PM

## 2020-08-26 NOTE — Progress Notes (Signed)
[facility]    SUBJECTIVE: Patient feels reasonably well no worsening congestion or cough no fever no sputum production resting comfortably in bed currently sleeping   Vitals:   08/25/20 1219 08/25/20 1539 08/25/20 2050 08/26/20 0522  BP: 110/81 116/64 (!) 127/96 (!) 144/86  Pulse: 87 67 89 (!) 58  Resp: '16 18 18 18  '$ Temp: 97.8 F (36.6 C) 97.9 F (36.6 C) 98.7 F (37.1 C) 97.7 F (36.5 C)  TempSrc: Oral Oral Oral   SpO2: 100% 100% 100% 100%  Weight:    46.1 kg  Height:         Intake/Output Summary (Last 24 hours) at 08/26/2020 [id number] Last data filed at 08/26/2020 0600 Gross per 24 hour  Intake 840 ml  Output 1100 ml  Net -260 ml      PHYSICAL EXAM  General: Ill-appearing in no acute distress HEENT:  Normocephalic and atramatic Neck:  No JVD.  Lungs: Diffuse rhonchi bilaterally to auscultation and percussion. Heart: HRRR . Normal S1 and S2 without gallops or murmurs.  Abdomen: Bowel sounds are positive, abdomen soft and non-tender  Msk:  Back normal, normal gait. Normal strength and tone for age. Extremities: No clubbing, cyanosis or edema.   Neuro: Alert and oriented X 3. Psych:  Good affect, responds appropriately   LABS: Basic Metabolic Panel: Recent Labs    08/25/20 0912 08/26/20 0519  NA 125* 124*  K 3.4* 3.9  CL 73* 76*  CO2 40* 37*  GLUCOSE 77 78  BUN 12 14  CREATININE 0.60 0.61  CALCIUM 8.4* 8.3*  MG  --  1.6*   Liver Function Tests: No results for input(s): AST, ALT, ALKPHOS, BILITOT, PROT, ALBUMIN in the last 72 hours. No results for input(s): LIPASE, AMYLASE in the last 72 hours. CBC: Recent Labs    08/25/20 0912 08/26/20 0519  WBC 12.7* 10.8*  NEUTROABS 8.9* 7.8*  HGB 11.9* 11.4*  HCT 34.0* 32.1*  MCV 97.1 95.5  PLT 244 231   Cardiac Enzymes: No results for input(s): CKTOTAL, CKMB, CKMBINDEX, TROPONINI in the last 72 hours. BNP: Invalid input(s): POCBNP D-Dimer: No results for input(s): DDIMER in the last 72  hours. Hemoglobin A1C: No results for input(s): HGBA1C in the last 72 hours. Fasting Lipid Panel: No results for input(s): CHOL, HDL, LDLCALC, TRIG, CHOLHDL, LDLDIRECT in the last 72 hours. Thyroid Function Tests: No results for input(s): TSH, T4TOTAL, T3FREE, THYROIDAB in the last 72 hours.  Invalid input(s): FREET3 Anemia Panel: No results for input(s): VITAMINB12, FOLATE, FERRITIN, TIBC, IRON, RETICCTPCT in the last 72 hours.  No results found.   Echo preserved left ventricular function of at least 55%  TELEMETRY: Sinus bradycardia 57:  ASSESSMENT AND PLAN:  Principal Problem:   Morphine Overdose, accidental or unintentional, initial encounter Active Problems:   Essential hypertension   Coronary artery disease involving native coronary artery of native heart without angina pectoris   Non-ischemic cardiomyopathy ([place])   Long term current use of opiate analgesic   Chronic anticoagulation (ELIQUIS)   Atrial fibrillation, chronic (HCC)   Bronchiectasis with (acute) exacerbation (HCC)   Chronic respiratory failure with hypoxia (HCC)   Chronic hyponatremia   Acute on chronic diastolic CHF (congestive heart failure) (HCC)   CHF (congestive heart failure) (HCC)    Plan Continue telemetry Reduce amiodarone to 200 daily Antibiotic therapy for bronchiectasis Continue anticoagulation for atrial fibrillation recommend reducing Eliquis to 2.5 twice a day  Continue with mild diuresis for congestive heart failure Continue modest blood  pressure control support Consider physical therapy Continue inhalers for respiratory support bronchiectasis Avoid narcotics because of respiratory suppression Agree evaluation by hospice and palliative care   Yolonda Kida, MD 08/26/2020 7:52 AM

## 2020-08-26 NOTE — Progress Notes (Addendum)
Daily Progress Note   Patient Name: Ana Washington       Date: 08/26/2020 DOB: Nov 23, 1945  Age: 75 y.o. MRN#: [id number] Attending Physician: Sidney Ace, MD Primary Care Physician: [name], MD Admit Date: 08/23/2020  Reason for Consultation/Follow-up: Establishing goals of care  Subjective: Patient is sitting in bedside chair. She appears to be somewhat short of breath today. She ate about 25% of her lunch.  She states she does not feel her breathing is as good today as it was yesterday. She asks questions about hospice vs palliative and an aggressive path vs  comfort path. She states she is not interested in returning to the hospital. She states she does want to continue home health PT/OT, and is not ready to stop this.   Continue plan for outpatient palliative.   Length of Stay: 2  Current Medications: Scheduled Meds:   amiodarone  200 mg Oral BID   apixaban  2.5 mg Oral BID   dicyclomine  20 mg Oral TID AC   digoxin  125 mcg Oral Daily   ferrous sulfate  325 mg Oral Daily   furosemide  40 mg Intravenous Daily   gabapentin  300 mg Oral QHS   guaiFENesin  1,200 mg Oral BID   levalbuterol  2 puff Inhalation Q6H   metoprolol tartrate  25 mg Oral TID   pantoprazole  40 mg Oral Daily   predniSONE  10 mg Oral Daily    Continuous Infusions:  promethazine (PHENERGAN) injection (IM or IVPB)      PRN Meds: acetaminophen **OR** acetaminophen, alum & mag hydroxide-simeth, benzonatate, diltiazem, Ipratropium-Albuterol, LORazepam, metoprolol tartrate, ondansetron **OR** ondansetron (ZOFRAN) IV, polyethylene glycol, promethazine (PHENERGAN) injection (IM or IVPB)  Physical Exam Pulmonary:     Effort: Pulmonary effort is normal.  Skin:    General: Skin is warm and dry.   Neurological:     Mental Status: She is alert.            Vital Signs: BP (!) 145/79 (BP Location: Left Arm)   Pulse (!) 55   Temp 98 F (36.7 C)   Resp 16   Ht '5\' 5"'$  (1.651 m)   Wt 46.1 kg   SpO2 100%   BMI 16.91 kg/m  SpO2: SpO2: 100 % O2 Device: O2 Device: Nasal Cannula  O2 Flow Rate: O2 Flow Rate (L/min): 2 L/min  Intake/output summary:  Intake/Output Summary (Last 24 hours) at 08/26/2020 1335 Last data filed at 08/26/2020 0600 Gross per 24 hour  Intake 480 ml  Output 850 ml  Net -370 ml   LBM: Last BM Date: 08/22/20 Baseline Weight: Weight: 50.1 kg Most recent weight: Weight: 46.1 kg        Patient Active Problem List   Diagnosis Date Noted   CHF (congestive heart failure) ([place]) 08/24/2020   Chronic hyponatremia 08/23/2020   Acute on chronic diastolic CHF (congestive heart failure) ([place]) 08/23/2020   Morphine Overdose, accidental or unintentional, initial encounter 08/23/2020   Bronchiectasis with acute exacerbation ([place]) 07/31/2020   CAP (community acquired pneumonia) 07/30/2020   Acute exacerbation of bronchiectasis ([place]) 07/14/2020   COVID-19 virus infection 07/14/2020   Lactic acidosis 07/14/2020   SIRS (systemic inflammatory response syndrome) ([place]) 07/14/2020   Elevated troponin level not due myocardial infarction 07/14/2020   Anxiety    Constipation    Pressure injury of right heel, stage 1    Bronchiectasis with (acute) exacerbation ([place]) 04/29/2020   Hemoptysis    Chronic respiratory failure with hypoxia ([place])    AKI (acute kidney injury) ([place])    Bronchiectasis ([place]) 09/08/2019   Atrial fibrillation, chronic ([place]) 09/08/2019   Closed right hip fracture ([place]) 09/27/2018   Intractable nausea and vomiting 04/08/2018   Atrial fibrillation with RVR ([place]) 04/08/2018   Thoracic radiculitis (Bilateral) 03/29/2018   Vasovagal episode 03/29/2018   Closed compression fracture of T10 thoracic vertebra, sequela 03/29/2018   Abnormal MRI, lumbar spine  (12/15/2016) 03/21/2018   Lumbar compression fractures, sequela (L1, L2, L3, L4, and L5) 03/21/2018   Closed compression fracture of L1 lumbar vertebra, sequela 03/21/2018   Closed compression fracture of L2 lumbar vertebra, sequela 03/21/2018   Closed compression fracture of L3 lumbar vertebra, sequela 03/21/2018   Closed compression fracture of L4 lumbar vertebra, sequela 03/21/2018   Closed compression fracture of L5 lumbar vertebra, sequela 03/21/2018   Thoracic compression fracture, sequela (T5, T9, T10, T11, and T12) 03/21/2018   Closed compression fracture of T5 thoracic vertebra, sequela 03/21/2018   Closed compression fracture of T9 thoracic vertebra, sequela 03/21/2018   Close compression fracture of T11 thoracic vertebra, sequela 03/21/2018   Closed compression fracture of T12 thoracic vertebra, sequela 03/21/2018   Lumbar facet hypertrophy 03/21/2018   Grade 1  Lumbar Anterolisthesis of L3/4 and L4/5 03/21/2018   Lumbar central spinal stenosis (Multilevel), 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 claudication 03/21/2018   Chronic anticoagulation (ELIQUIS) 03/21/2018   History of pelvic fracture 03/21/2018   Chronic musculoskeletal pain 03/21/2018   Neurogenic pain 03/21/2018   Long term prescription benzodiazepine use 03/21/2018   DDD (degenerative disc disease), 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   Adult bronchiectasis ([place]) 03/01/2018   Diverticulitis 03/01/2018   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[place]) 03/01/2018   Migraines 03/01/2018   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   Osteoporosis, 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   Psoriasis 03/01/2018   Chronic upper back pain (Primary Area of Pain) (Bilateral) (R>L) 03/01/2018   Chronic low back pain (Secondary Area of Pain) (Bilateral) (R>L) w/o sciatica 03/01/2018   Chronic pain syndrome 03/01/2018   Long term current use of opiate analgesic 03/01/2018   Pharmacologic therapy 03/01/2018   Disorder of skeletal system 03/01/2018   Problems influencing health  status 03/01/2018   History of kyphoplasty (L1, L2, L3, T9, T11, and T12) 01/05/2018   Malnutrition of moderate degree ([place]) 08/03/2017   GERD (gastroesophageal reflux disease) 07/30/2017  Pelvic fracture (HCC) 07/30/2017   AF (paroxysmal atrial fibrillation) ([place]) 07/30/2017   Other dysphagia 09/13/2016   Unintended weight loss 09/13/2016   Essential hypertension 10/03/2013   Osteoarthritis of knees (Bilateral) 10/03/2013   Primary localized osteoarthrosis, lower leg 10/03/2013   Non-ischemic cardiomyopathy ([place]) 09/12/2013   Coronary artery disease involving native coronary artery of native heart without angina pectoris 07/28/2012   Hyperlipidemia, mixed 07/28/2012   DDD (degenerative disc disease), lumbar 02/29/2012    Palliative Care Assessment & Plan     Recommendations/Plan: Continue plans for outpatient palliative. If her respiratory status continues as it is today, she may opt for hospice care.  Code Status:    Code Status Orders  (From admission, onward)           Start     Ordered   08/24/20 0541  Do not attempt resuscitation (DNR)  Continuous       Question Answer Comment  In the event of cardiac or respiratory ARREST Do not call a "code blue"   In the event of cardiac or respiratory ARREST Do not perform Intubation, CPR, defibrillation or ACLS   In the event of cardiac or respiratory ARREST Use medication by any route, position, wound care, and other measures to relive pain and suffering. May use oxygen, suction and manual treatment of airway obstruction as needed for comfort.      08/24/20 0541           Code Status History     Date Active Date Inactive Code Status Order ID Comments User Context   07/30/2020 2312 08/07/2020 1903 DNR [id number]  Christel Mormon, MD ED   07/14/2020 1452 07/20/2020 2312 DNR [id number]  Vernelle Emerald, MD ED   04/29/2020 0551 05/02/2020 1832 DNR [id number]  Sidney Ace, Arvella Merles, MD ED   09/08/2019 0853 09/10/2019 2215 DNR [id number]  Karmen Bongo, MD ED   09/27/2018 1816 10/11/2018 1818 DNR [id number]  Demetrios Loll, MD ED   04/19/2018 1147 04/19/2018 1549 Full Code [id number]  Hessie Knows, MD Inpatient   04/08/2018 1817 04/14/2018 1637 DNR [id number]  Tyler Pita, MD Inpatient   04/08/2018 0752 04/08/2018 1817 Full Code [id number]  Harrie Foreman, MD Inpatient   04/05/2018 1455 04/05/2018 1831 Full Code [id number]  Hessie Knows, MD Inpatient   01/05/2018 1836 01/06/2018 1504 Full Code [id number]  Hessie Knows, MD Inpatient   12/18/2017 1741 12/18/2017 2125 Full Code [id number]  Hessie Knows, MD Inpatient   11/29/2017 1555 11/29/2017 2013 Full Code [id number]  Hessie Knows, MD Inpatient   07/31/2017 1054 08/03/2017 1524 DNR [id number]  Demetrios Loll, MD Inpatient   07/31/2017 0057 07/31/2017 1054 Full Code [id number]  Lance Coon, MD Inpatient   07/05/2016 0931 07/05/2016 1311 Full Code [id number]  Hessie Knows, MD Inpatient      Advance Directive Documentation    Flowsheet Row Most Recent Value  Type of Advance Directive Living will  Pre-existing out of facility DNR order (yellow form or pink MOST form) --  "MOST" Form in Place? --       Thank you for allowing the Palliative Medicine Team to assist in the care of this patient.       Total Time 15 min Prolonged Time Billed  no       Greater than 50%  of this time was spent counseling and coordinating care related to the above assessment and plan.  Asencion Gowda, NP  Please contact Palliative Medicine Team phone at [phone] for questions and  concerns.

## 2020-08-26 NOTE — Evaluation (Signed)
Physical Therapy Evaluation Patient Details Name: Ana Washington MRN: [id number] DOB: 1945-07-31 Today's Date: 08/26/2020   History of Present Illness  Ana Washington is a 75 y.o. female with a history of nonischemic cardiomyopathy, diastolic CHF, last EF 50 to 55% 03/2020, Takotsubo cardiomyopathy, Afib, HTN, MI, PUD, lumbar compression fractures, thoracic compression fractures, chronic MAC infection with bronchiectasis on 3 L nasal cannula at home, recent multiple hospitalizations due to COVID-19 infection, SOB. Pt admitted unintentional overdose of morphine, reversed with Narcan by EMS.  Clinical Impression  Patient seated in recliner upon arrival to room; alert and oriented, follows commands and agreeable to participation with session.  "I've got to try.  That's the only way to get stronger".  Endorses generalized pain/soreness in abdomen and in buttocks; reports meds requested per RN prior to session.  Patient generally weak and deconditioned throughout all extremities; however, no focal weakness/asymmetry noted.  Currently requiring min assist for sit/stand, basic transfers and gait (25') with RW.  Demonstrates reciprocal stepping pattern with fair step height/length; narrowed BOS; slow and cautious performance.  Mod SOB with minimal distance/exertion; sats >94% on 2L.  Would benefit from skilled PT to address above deficits and promote optimal return to PLOF.; Recommend transition to HHPT upon discharge from acute hospitalization.     Follow Up Recommendations Home health PT;Supervision/Assistance - 24 hour    Equipment Recommendations  Rolling walker with 5" wheels    Recommendations for Other Services       Precautions / Restrictions Precautions Precautions: Fall Restrictions Weight Bearing Restrictions: No      Mobility  Bed Mobility               General bed mobility comments: seated in recliner beginning/end of treatment session    Transfers Overall transfer  level: Needs assistance Equipment used: Rolling walker (2 wheeled) Transfers: Sit to/from Stand Sit to Stand: Min guard;Min assist         General transfer comment: cuing for handp lacement, assist for initial lift off  Ambulation/Gait Ambulation/Gait assistance: Min guard Gait Distance (Feet): 25 Feet Assistive device: Rolling walker (2 wheeled)       General Gait Details: reciprocal stepping pattern with fair step height/length; narrowed BOS; slow and cautious performance.  Mod SOB with minimal distance/exertion; sats >94% on 2L  Stairs            Wheelchair Mobility    Modified Rankin (Stroke Patients Only)       Balance Overall balance assessment: Needs assistance Sitting-balance support: No upper extremity supported;Feet supported Sitting balance-Leahy Scale: Good     Standing balance support: Bilateral upper extremity supported Standing balance-Leahy Scale: Fair                               Pertinent Vitals/Pain Pain Assessment: Faces Faces Pain Scale: Hurts little more Pain Location: abdominal pain, soreness in buttocks Pain Descriptors / Indicators: Grimacing;Guarding;Aching Pain Intervention(s): Limited activity within patient's tolerance;Monitored during session;Repositioned    Home Living Family/patient expects to be discharged to:: Private residence Living Arrangements: Spouse/significant other Available Help at Discharge: Family;Available 24 hours/day;Personal care attendant Type of Home: House Home Access: Level entry     Home Layout: One level Home Equipment: Walker - 2 wheels;Bedside commode;Tub bench;Wheelchair - Comptroller - 4 wheels Additional Comments: Her sister lives close and assists, patient helps husband who is disabled    Prior Function Level of Independence: Needs assistance  Gait / Transfers Assistance Needed: pt reports ambulating wtih rollator; home O2 at 2L  ADL's / Homemaking  Assistance Needed: PCA 9a-1p: helps with laundry, makes beds, breakfast, lunch, cleaning, tub transfers, and bathing. Sister provides transportation. Son provides dinner.  Comments: pt denies recent falls     Hand Dominance   Dominant Hand: Right    Extremity/Trunk Assessment   Upper Extremity Assessment Upper Extremity Assessment: Generalized weakness    Lower Extremity Assessment Lower Extremity Assessment: Generalized weakness (grossly 4-/5 throughout)       Communication   Communication: No difficulties  Cognition Arousal/Alertness: Awake/alert Behavior During Therapy: WFL for tasks assessed/performed Overall Cognitive Status: Within Functional Limits for tasks assessed                                        General Comments      Exercises Other Exercises Other Exercises: Toilet transfer, SPT without assist device, min assist-does require UE support throughout.  Generally weak and unsteady; quick to fatigue.   Assessment/Plan    PT Assessment Patient needs continued PT services  PT Problem List Decreased strength;Decreased mobility;Decreased activity tolerance;Cardiopulmonary status limiting activity       PT Treatment Interventions Therapeutic exercise;Gait training;Functional mobility training;Therapeutic activities;Patient/family education;Balance training;Neuromuscular re-education    PT Goals (Current goals can be found in the Care Plan section)  Acute Rehab PT Goals Patient Stated Goal: go home PT Goal Formulation: With patient/family Time For Goal Achievement: 09/09/20 Potential to Achieve Goals: Fair    Frequency Min 2X/week   Barriers to discharge        Co-evaluation               AM-PAC PT "6 Clicks" Mobility  Outcome Measure Help needed turning from your back to your side while in a flat bed without using bedrails?: None Help needed moving from lying on your back to sitting on the side of a flat bed without using  bedrails?: None Help needed moving to and from a bed to a chair (including a wheelchair)?: A Little Help needed standing up from a chair using your arms (e.g., wheelchair or bedside chair)?: A Little Help needed to walk in hospital room?: A Little Help needed climbing 3-5 steps with a railing? : A Little 6 Click Score: 20    End of Session Equipment Utilized During Treatment: Gait belt;Oxygen Activity Tolerance: Patient tolerated treatment well Patient left: in chair;with call bell/phone within reach;with chair alarm set Nurse Communication: Mobility status PT Visit Diagnosis: Muscle weakness (generalized) (M62.81);Difficulty in walking, not elsewhere classified (R26.2)    Time: 1937-9024 PT Time Calculation (min) (ACUTE ONLY): 30 min   Charges:   PT Evaluation $PT Eval Moderate Complexity: 1 Mod PT Treatments $Therapeutic Activity: 8-22 mins        Emie Sommerfeld H. Owens Shark, PT, DPT, NCS 08/26/20, 7:19 PM [phone]

## 2020-08-27 DIAGNOSIS — T50901A Poisoning by unspecified drugs, medicaments and biological substances, accidental (unintentional), initial encounter: Secondary | ICD-10-CM | POA: Diagnosis not present

## 2020-08-27 DIAGNOSIS — Z7189 Other specified counseling: Secondary | ICD-10-CM | POA: Diagnosis not present

## 2020-08-27 LAB — CBC WITH DIFFERENTIAL/PLATELET
Abs Immature Granulocytes: 0.11 10*3/uL — ABNORMAL HIGH (ref 0.00–0.07)
Basophils Absolute: 0.1 10*3/uL (ref 0.0–0.1)
Basophils Relative: 1 %
Eosinophils Absolute: 0.1 10*3/uL (ref 0.0–0.5)
Eosinophils Relative: 1 %
HCT: 32.2 % — ABNORMAL LOW (ref 36.0–46.0)
Hemoglobin: 11.4 g/dL — ABNORMAL LOW (ref 12.0–15.0)
Immature Granulocytes: 1 %
Lymphocytes Relative: 21 %
Lymphs Abs: 2 10*3/uL (ref 0.7–4.0)
MCH: 34.4 pg — ABNORMAL HIGH (ref 26.0–34.0)
MCHC: 35.4 g/dL (ref 30.0–36.0)
MCV: 97.3 fL (ref 80.0–100.0)
Monocytes Absolute: 1 10*3/uL (ref 0.1–1.0)
Monocytes Relative: 11 %
Neutro Abs: 6.1 10*3/uL (ref 1.7–7.7)
Neutrophils Relative %: 65 %
Platelets: 249 10*3/uL (ref 150–400)
RBC: 3.31 MIL/uL — ABNORMAL LOW (ref 3.87–5.11)
RDW: 13 % (ref 11.5–15.5)
WBC: 9.5 10*3/uL (ref 4.0–10.5)
nRBC: 0 % (ref 0.0–0.2)

## 2020-08-27 LAB — BASIC METABOLIC PANEL
Anion gap: 14 (ref 5–15)
BUN: 14 mg/dL (ref 8–23)
CO2: 36 mmol/L — ABNORMAL HIGH (ref 22–32)
Calcium: 8.5 mg/dL — ABNORMAL LOW (ref 8.9–10.3)
Chloride: 74 mmol/L — ABNORMAL LOW (ref 98–111)
Creatinine, Ser: 0.65 mg/dL (ref 0.44–1.00)
GFR, Estimated: >60 mL/min (ref >60)
Glucose, Bld: 71 mg/dL (ref 70–99)
Potassium: 3.4 mmol/L — ABNORMAL LOW (ref 3.5–5.1)
Sodium: 124 mmol/L — ABNORMAL LOW (ref 135–145)

## 2020-08-27 MED ORDER — FUROSEMIDE 40 MG PO TABS
40.0000 mg | ORAL_TABLET | Freq: Once | ORAL | Status: AC
  Administered 2020-08-27: 40 mg via ORAL
  Filled 2020-08-27: qty 1

## 2020-08-27 MED ORDER — POTASSIUM CHLORIDE CRYS ER 20 MEQ PO TBCR
40.0000 meq | EXTENDED_RELEASE_TABLET | Freq: Once | ORAL | Status: AC
  Administered 2020-08-27: 40 meq via ORAL
  Filled 2020-08-27: qty 2

## 2020-08-27 MED ORDER — FUROSEMIDE 20 MG PO TABS: 20.0000 mg | ORAL_TABLET | Freq: Every day | ORAL | 0 refills | Status: DC

## 2020-08-27 MED ORDER — APIXABAN 2.5 MG PO TABS: 2.5000 mg | ORAL_TABLET | Freq: Two times a day (BID) | ORAL | 0 refills | Status: DC

## 2020-08-27 NOTE — Progress Notes (Signed)
Manufacturing engineer [facility])  Ana Washington is a pending outpatient palliative patient that was admitted prior to being enrolled in our outpatient palliative care services.  Discussed with PMT, likely dc today and will need close following for readiness for hospice.  ACC will follow up with patient when she is back at home.  Thank you, [name] Carbon RN, BSN, [facility] Liaison

## 2020-08-27 NOTE — Care Management Important Message (Signed)
Important Message  Patient Details  Name: Ana Washington MRN: [id number] Date of Birth: 06-08-1945   Medicare Important Message Given:  Yes    Reviewed Medicare IM with patient via room phone due to isolation status.  Aware of right to appeal.  Confirmed she received copy emailed during previous admission.  Declined copy of Medicare IM at this time.     Dannette Barbara 08/27/2020, 1:54 PM

## 2020-08-27 NOTE — TOC Initial Note (Addendum)
Transition of Care [facility]) - Initial/Assessment Note    Patient Details  Name: Ana Washington MRN: [id number] Date of Birth: [birth year]  [facility]) CM/SW Contact:    Alberteen Sam, LCSW Phone Number: 08/27/2020, 10:12 AM  Clinical Narrative:                  CSW spoke with patient as she would like to continue [facility] services, would like to continue with [place] for [facility] PT, RN and aide. [place] with [place] updated. Patient to continue to be followed by outpatient palliative with [facility]. Patient reports no further dc needs at this time.   Expected Discharge Plan: [place] Barriers to Discharge: Continued Medical Work up   Patient Goals and CMS Choice Patient states their goals for this hospitalization and ongoing recovery are:: to go home CMS Medicare.gov Compare Post Acute Care list provided to:: Patient Choice offered to / list presented to : Patient  Expected Discharge Plan and Services Expected Discharge Plan: [place] Acute Care Choice: [place] arrangements for the past 2 months: Single Family Home                           HH Arranged: PT, RN, Nurse's Aide HH Agency: [place] Date [facility] Agency Contacted: 08/27/20 Time HH Agency Contacted: 46 Representative spoke with at [place]: [place]  Prior Living Arrangements/Services Living arrangements for the past 2 months: [place] with:: Self Patient language and need for interpreter reviewed:: Yes Do you feel safe going back to the place where you live?: Yes      Need for Family Participation in Patient Care: Yes (Comment) Care giver support system in place?: Yes (comment)   Criminal Activity/Legal Involvement Pertinent to Current Situation/Hospitalization: No - Comment as needed  Activities of Daily Living Home Assistive Devices/Equipment: Walker (specify type) ADL Screening (condition at time of  admission) Patient's cognitive ability adequate to safely complete daily activities?: Yes Is the patient deaf or have difficulty hearing?: Yes Does the patient have difficulty seeing, even when wearing glasses/contacts?: No Does the patient have difficulty concentrating, remembering, or making decisions?: No Patient able to express need for assistance with ADLs?: Yes Does the patient have difficulty dressing or bathing?: No Independently performs ADLs?: Yes (appropriate for developmental age) Does the patient have difficulty walking or climbing stairs?: Yes Weakness of Legs: Both Weakness of Arms/Hands: Both  Permission Sought/Granted         Permission granted to share info w AGENCY: HH        Emotional Assessment   Attitude/Demeanor/Rapport: Gracious Affect (typically observed): Calm Orientation: : Oriented to Self, Oriented to Place, Oriented to  Time, Oriented to Situation Alcohol / Substance Use: Not Applicable Psych Involvement: No (comment)  Admission diagnosis:  CHF (congestive heart failure) ([place]) [I50.9] Opioid overdose, accidental or unintentional, initial encounter ([place]) [T40.2X1A] Acute on chronic respiratory failure after trauma ([place]) [J96.20] Patient Active Problem List   Diagnosis Date Noted   CHF (congestive heart failure) ([place]) 08/24/2020   Chronic hyponatremia 08/23/2020   Acute on chronic diastolic CHF (congestive heart failure) ([place]) 08/23/2020   Morphine Overdose, accidental or unintentional, initial encounter 08/23/2020   Bronchiectasis with acute exacerbation ([place]) 07/31/2020   CAP (community acquired pneumonia) 07/30/2020   Acute exacerbation of bronchiectasis ([place]) 07/14/2020   COVID-19 virus infection 07/14/2020   Lactic acidosis  07/14/2020   SIRS (systemic inflammatory response syndrome) ([place]) 07/14/2020   Elevated troponin level not due myocardial infarction 07/14/2020   Anxiety    Constipation    Pressure injury of right heel, stage 1     Bronchiectasis with (acute) exacerbation ([place]) 04/29/2020   Hemoptysis    Chronic respiratory failure with hypoxia (HCC)    AKI (acute kidney injury) ([place])    Bronchiectasis ([place]) 09/08/2019   Atrial fibrillation, chronic (HCC) 09/08/2019   Closed right hip fracture (HCC) 09/27/2018   Intractable nausea and vomiting 04/08/2018   Atrial fibrillation with RVR ([place]) 04/08/2018   Thoracic radiculitis (Bilateral) 03/29/2018   Vasovagal episode 03/29/2018   Closed compression fracture of T10 thoracic vertebra, sequela 03/29/2018   Abnormal MRI, lumbar spine (12/15/2016) 03/21/2018   Lumbar compression fractures, sequela (L1, L2, L3, L4, and L5) 03/21/2018   Closed compression fracture of L1 lumbar vertebra, sequela 03/21/2018   Closed compression fracture of L2 lumbar vertebra, sequela 03/21/2018   Closed compression fracture of L3 lumbar vertebra, sequela 03/21/2018   Closed compression fracture of L4 lumbar vertebra, sequela 03/21/2018   Closed compression fracture of L5 lumbar vertebra, sequela 03/21/2018   Thoracic compression fracture, sequela (T5, T9, T10, T11, and T12) 03/21/2018   Closed compression fracture of T5 thoracic vertebra, sequela 03/21/2018   Closed compression fracture of T9 thoracic vertebra, sequela 03/21/2018   Close compression fracture of T11 thoracic vertebra, sequela 03/21/2018   Closed compression fracture of T12 thoracic vertebra, sequela 03/21/2018   Lumbar facet hypertrophy 03/21/2018   Grade 1  Lumbar Anterolisthesis of L3/4 and L4/5 03/21/2018   Lumbar central spinal stenosis (Multilevel), 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 claudication 03/21/2018   Chronic anticoagulation (ELIQUIS) 03/21/2018   History of pelvic fracture 03/21/2018   Chronic musculoskeletal pain 03/21/2018   Neurogenic pain 03/21/2018   Long term prescription benzodiazepine use 03/21/2018   DDD (degenerative disc disease), 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   Adult bronchiectasis ([place]) 03/01/2018   Diverticulitis  03/01/2018   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[place]) 03/01/2018   Migraines 03/01/2018   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   Osteoporosis, 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   Psoriasis 03/01/2018   Chronic upper back pain (Primary Area of Pain) (Bilateral) (R>L) 03/01/2018   Chronic low back pain (Secondary Area of Pain) (Bilateral) (R>L) w/o sciatica 03/01/2018   Chronic pain syndrome 03/01/2018   Long term current use of opiate analgesic 03/01/2018   Pharmacologic therapy 03/01/2018   Disorder of skeletal system 03/01/2018   Problems influencing health status 03/01/2018   History of kyphoplasty (L1, L2, L3, T9, T11, and T12) 01/05/2018   Malnutrition of moderate degree ([place]) 08/03/2017   GERD (gastroesophageal reflux disease) 07/30/2017   Pelvic fracture ([place]) 07/30/2017   AF (paroxysmal atrial fibrillation) ([place]) 07/30/2017   Other dysphagia 09/13/2016   Unintended weight loss 09/13/2016   Essential hypertension 10/03/2013   Osteoarthritis of knees (Bilateral) 10/03/2013   Primary localized osteoarthrosis, lower leg 10/03/2013   Non-ischemic cardiomyopathy ([place] ) 09/12/2013   Coronary artery disease involving native coronary artery of native heart without angina pectoris 07/28/2012   Hyperlipidemia, mixed 07/28/2012   DDD (degenerative disc disease), lumbar 02/29/2012   PCP:  [name], MD Pharmacy:   [facility] [place], [place] - [place] [address] [place] [address] [place] [address] Phone: [phone] Fax: [phone]     Social Determinants of Health (SDOH) Interventions    Readmission Risk Interventions Readmission Risk Prevention Plan 07/17/2020 01/06/2018  Transportation  Screening Complete Complete  PCP or Specialist Appt within 5-7 Days - Complete  PCP or Specialist Appt within 3-5 Days Complete -  Home Care Screening - Complete  Medication Review (RN CM) - Complete  HRI or Home  Care Consult Complete -  Social Work Consult for [place] Planning/Counseling Complete -  Palliative Care Screening Not Applicable -  Medication Review (RN Care Manager) Complete -  Some recent data might be hidden

## 2020-08-27 NOTE — Progress Notes (Signed)
Daily Progress Note   Patient Name: Ana Washington       Date: 08/27/2020 DOB: 1945/04/03  Age: 75 y.o. MRN#: [id number] Attending Physician: Sidney Ace, MD Primary Care Physician: [name], MD Admit Date: 08/23/2020  Reason for Consultation/Follow-up: Establishing goals of care  Subjective: Patient appears to have WOB noted. She is eating small amounts of food today, and stating today as yesterday that there are issues with the food that she does not want to eat it.   She states she feels her breathing is not good at this time. Discussed her diuresis and volume/electrolyte status. Discussed that from a medical standpoint, she is appropriate for hospice at her home if she chooses. She discusses concerns that she was "dropped" last time from their service and discusses her wishes to continue [facility] PT/OT. She is aware from her previous experience that she will not be able to have the two services at the same time. I verified with palliative liaison that medicare will not cover [facility] PT/OT and hospice. Patient is not interested in returning to the hospital.   Anticipate rapid decline with the need for quick transition to hospice care in order to honor her wishes to stay at home.  Length of Stay: 3  Current Medications: Scheduled Meds:   amiodarone  200 mg Oral BID   apixaban  2.5 mg Oral BID   dicyclomine  20 mg Oral TID AC   digoxin  125 mcg Oral Daily   ferrous sulfate  325 mg Oral Daily   gabapentin  300 mg Oral QHS   guaiFENesin  1,200 mg Oral BID   levalbuterol  2 puff Inhalation Q6H   metoprolol tartrate  25 mg Oral TID   pantoprazole  40 mg Oral Daily   predniSONE  10 mg Oral Daily    Continuous Infusions:  promethazine (PHENERGAN) injection (IM or IVPB)      PRN  Meds: acetaminophen **OR** acetaminophen, alum & mag hydroxide-simeth, benzonatate, diltiazem, Ipratropium-Albuterol, LORazepam, metoprolol tartrate, ondansetron **OR** ondansetron (ZOFRAN) IV, polyethylene glycol, promethazine (PHENERGAN) injection (IM or IVPB)  Physical Exam Pulmonary:     Comments: Some WOB noted.  Neurological:     Mental Status: She is alert.            Vital Signs: BP (!) 143/75 (BP Location:  Right Arm)   Pulse 61   Temp (!) 97.5 F (36.4 C) (Oral)   Resp 14   Ht '5\' 5"'$  (1.651 m)   Wt 46.1 kg   SpO2 97%   BMI 16.91 kg/m  SpO2: SpO2: 97 % O2 Device: O2 Device: Nasal Cannula O2 Flow Rate: O2 Flow Rate (L/min): 2 L/min  Intake/output summary:  Intake/Output Summary (Last 24 hours) at 08/27/2020 1257 Last data filed at 08/26/2020 2100 Gross per 24 hour  Intake 240 ml  Output --  Net 240 ml   LBM: Last BM Date: 08/27/20 Baseline Weight: Weight: 50.1 kg Most recent weight: Weight: 46.1 kg   Patient Active Problem List   Diagnosis Date Noted   CHF (congestive heart failure) ([place]) 08/24/2020   Chronic hyponatremia 08/23/2020   Acute on chronic diastolic CHF (congestive heart failure) ([place]) 08/23/2020   Morphine Overdose, accidental or unintentional, initial encounter 08/23/2020   Bronchiectasis with acute exacerbation ([place]) 07/31/2020   CAP (community acquired pneumonia) 07/30/2020   Acute exacerbation of bronchiectasis ([place]) 07/14/2020   COVID-19 virus infection 07/14/2020   Lactic acidosis 07/14/2020   SIRS (systemic inflammatory response syndrome) ([place]) 07/14/2020   Elevated troponin level not due myocardial infarction 07/14/2020   Anxiety    Constipation    Pressure injury of right heel, stage 1    Bronchiectasis with (acute) exacerbation ([place]) 04/29/2020   Hemoptysis    Chronic respiratory failure with hypoxia (HCC)    AKI (acute kidney injury) ([place])    Bronchiectasis ([place]) 09/08/2019   Atrial fibrillation, chronic ([place]) 09/08/2019   Closed  right hip fracture (HCC) 09/27/2018   Intractable nausea and vomiting 04/08/2018   Atrial fibrillation with RVR ([place]) 04/08/2018   Thoracic radiculitis (Bilateral) 03/29/2018   Vasovagal episode 03/29/2018   Closed compression fracture of T10 thoracic vertebra, sequela 03/29/2018   Abnormal MRI, lumbar spine (12/15/2016) 03/21/2018   Lumbar compression fractures, sequela (L1, L2, L3, L4, and L5) 03/21/2018   Closed compression fracture of L1 lumbar vertebra, sequela 03/21/2018   Closed compression fracture of L2 lumbar vertebra, sequela 03/21/2018   Closed compression fracture of L3 lumbar vertebra, sequela 03/21/2018   Closed compression fracture of L4 lumbar vertebra, sequela 03/21/2018   Closed compression fracture of L5 lumbar vertebra, sequela 03/21/2018   Thoracic compression fracture, sequela (T5, T9, T10, T11, and T12) 03/21/2018   Closed compression fracture of T5 thoracic vertebra, sequela 03/21/2018   Closed compression fracture of T9 thoracic vertebra, sequela 03/21/2018   Close compression fracture of T11 thoracic vertebra, sequela 03/21/2018   Closed compression fracture of T12 thoracic vertebra, sequela 03/21/2018   Lumbar facet hypertrophy 03/21/2018   Grade 1  Lumbar Anterolisthesis of L3/4 and L4/5 03/21/2018   Lumbar central spinal stenosis (Multilevel), 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 claudication 03/21/2018   Chronic anticoagulation (ELIQUIS) 03/21/2018   History of pelvic fracture 03/21/2018   Chronic musculoskeletal pain 03/21/2018   Neurogenic pain 03/21/2018   Long term prescription benzodiazepine use 03/21/2018   DDD (degenerative disc disease), 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   Adult bronchiectasis ([place]) 03/01/2018   Diverticulitis 03/01/2018   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[place]) 03/01/2018   Migraines 03/01/2018   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   Osteoporosis, 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   Psoriasis 03/01/2018   Chronic upper back pain (Primary Area of Pain)  (Bilateral) (R>L) 03/01/2018   Chronic low back pain (Secondary Area of Pain) (Bilateral) (R>L) w/o sciatica 03/01/2018   Chronic pain syndrome 03/01/2018   Long term current use of opiate analgesic 03/01/2018  Pharmacologic therapy 03/01/2018   Disorder of skeletal system 03/01/2018   Problems influencing health status 03/01/2018   History of kyphoplasty (L1, L2, L3, T9, T11, and T12) 01/05/2018   Malnutrition of moderate degree ([place]) 08/03/2017   GERD (gastroesophageal reflux disease) 07/30/2017   Pelvic fracture ([place]) 07/30/2017   AF (paroxysmal atrial fibrillation) ([place]) 07/30/2017   Other dysphagia 09/13/2016   Unintended weight loss 09/13/2016   Essential hypertension 10/03/2013   Osteoarthritis of knees (Bilateral) 10/03/2013   Primary localized osteoarthrosis, lower leg 10/03/2013   Non-ischemic cardiomyopathy ([place]) 09/12/2013   Coronary artery disease involving native coronary artery of native heart without angina pectoris 07/28/2012   Hyperlipidemia, mixed 07/28/2012   DDD (degenerative disc disease), lumbar 02/29/2012    Palliative Care Assessment & Plan    Recommendations/Plan: D/C with palliative. She cannot have hospice and [facility] PT/OT per rep. Anticipate the need for quick transition from palliative to hospice in order to honor her wishes of not returning to the hospital. Hospice liaison and outpatient palliative NP is aware.     Code Status:    Code Status Orders  (From admission, onward)           Start     Ordered   08/24/20 0541  Do not attempt resuscitation (DNR)  Continuous       Question Answer Comment  In the event of cardiac or respiratory ARREST Do not call a "code blue"   In the event of cardiac or respiratory ARREST Do not perform Intubation, CPR, defibrillation or ACLS   In the event of cardiac or respiratory ARREST Use medication by any route, position, wound care, and other measures to relive pain and suffering. May use oxygen, suction and  manual treatment of airway obstruction as needed for comfort.      08/24/20 0541           Code Status History     Date Active Date Inactive Code Status Order ID Comments User Context   07/30/2020 2312 08/07/2020 1903 DNR [id number]  Christel Mormon, MD ED   07/14/2020 1452 07/20/2020 2312 DNR [id number]  Vernelle Emerald, MD ED   04/29/2020 0551 05/02/2020 1832 DNR [id number]  Sidney Ace, Arvella Merles, MD ED   09/08/2019 0853 09/10/2019 2215 DNR [id number]  Karmen Bongo, MD ED   09/27/2018 1816 10/11/2018 1818 DNR [id number]  Demetrios Loll, MD ED   04/19/2018 1147 04/19/2018 1549 Full Code [id number]  Hessie Knows, MD Inpatient   04/08/2018 1817 04/14/2018 1637 DNR [id number]  Tyler Pita, MD Inpatient   04/08/2018 0752 04/08/2018 1817 Full Code [id number]  Harrie Foreman, MD Inpatient   04/05/2018 1455 04/05/2018 1831 Full Code [id number]  Hessie Knows, MD Inpatient   01/05/2018 1836 01/06/2018 1504 Full Code [id number]  Hessie Knows, MD Inpatient   12/18/2017 1741 12/18/2017 2125 Full Code [id number]  Hessie Knows, MD Inpatient   11/29/2017 1555 11/29/2017 2013 Full Code [id number]  Hessie Knows, MD Inpatient   07/31/2017 1054 08/03/2017 1524 DNR [id number]  Demetrios Loll, MD Inpatient   07/31/2017 0057 07/31/2017 1054 Full Code [id number]  Lance Coon, MD Inpatient   07/05/2016 0931 07/05/2016 1311 Full Code [id number]  Hessie Knows, MD Inpatient      Advance Directive Documentation    Flowsheet Row Most Recent Value  Type of Advance Directive Living will  Pre-existing out of facility DNR order (yellow form or pink MOST form) --  "MOST" Form in Place? --       Prognosis:  <  3 months    Care plan was discussed with attending team.   Thank you for allowing the Palliative Medicine Team to assist in the care of this patient.       Total Time 35 min Prolonged Time Billed  no       Greater than 50%  of this time was spent counseling and coordinating care related to the above assessment and plan.  Asencion Gowda,  NP  Please contact Palliative Medicine Team phone at [phone] for questions and concerns.

## 2020-08-27 NOTE — Discharge Summary (Signed)
Physician Discharge Summary  Ana Washington JJO:[id number] DOB: April 28, 1945 DOA: 08/23/2020  PCP: [name], MD  Admit date: 08/23/2020 Discharge date: 08/27/2020  Admitted From: Home Disposition: Home with home health  Recommendations for Outpatient Follow-up:  Follow-up with outpatient palliative care  Home Health: Yes Equipment/Devices: Oxygen 2 L  Discharge Condition: Guarded CODE STATUS: DNR Diet recommendation: Dysphagia  Brief/Interim Summary:  75 y.o. female with medical history significant for , nonischemic cardiomyopathy, diastolic CHF, last EF 50 to 55% 03/2020, Takotsubo cardiomyopathy, HTN, paroxysmal A. 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 fib, who presents with an unintentional overdose of morphine, reversed with Narcan by EMS.  Patient reports that she had been having a headache that was severe and felt worsening shortness of breath and asked that her sister administer some leftover morphine that she had.  She became obtunded after administration of morphine and EMS was called out.  She returned to normal mentation following a dose of Narcan but was subsequently brought to the ED.  She continued to have shortness of breath but denied chest pain.  Cough is at baseline.  Had no fever or chills.  Denied nausea, vomiting, abdominal pain or diarrhea.   On admission she was in rapid atrial fibrillation.  Cardiology consulted.  Recommendations appreciated.  Currently well rate controlled on amiodarone, metoprolol, digoxin.   Continues to endorse shortness of breath.  Diuresing effectively.  Rate controlled on amiodarone, metoprolol, digoxin.  On day of discharge patient is approaching euvolemia.  She still endorses some shortness of breath though unlikely to be fluid related at this point.  We do lengthy discussion as I am  familiar with the patient from prior hospitalizations and she is well-known to the hospitalist, palliative care, cardiology, pulmonology services.  The patient understands the gravity of her situation and that she is likely to deteriorate when she goes home.  We have given her information on how to contact outpatient palliative care and my anticipation is that if she does deteriorate at home she may be a good candidate for transition to home with hospice as she does not wish to be rehospitalized.     Discharge Diagnoses:  Principal Problem:   Morphine Overdose, accidental or unintentional, initial encounter Active Problems:   Essential hypertension   Coronary artery disease involving native coronary artery of native heart without angina pectoris   Non-ischemic cardiomyopathy ([place])   Long term current use of opiate analgesic   Chronic anticoagulation (ELIQUIS)   Atrial fibrillation, chronic (HCC)   Bronchiectasis with (acute) exacerbation (HCC)   Chronic respiratory failure with hypoxia (HCC)   Chronic hyponatremia   Acute on chronic diastolic CHF (congestive heart failure) (HCC)   CHF (congestive heart failure) (HCC)   Morphine Overdose, accidental or unintentional, Patient took concentrated morphine given to her by hospice.  She became obtunded and required admission to ED.  Status post Narcan mental status returned to baseline.   Acute dyspnea Chronic respiratory failure with hypoxia - Multifactorial related to exacerbations of bronchiectasis, diastolic heart failure and depression from morphine overdose - No increasing oxygen needs.  Saturating in the 90s on 3 L - Continues to endorse shortness of breath.  Likely multifactorial in origin Plan: At time of discharge respiratory status is at baseline.  Patient is still somewhat tachypneic with increased work of breathing but this is unlikely to completely  recover.  Will recommend that she take Lasix daily instead of as needed.  Dose is  20 mg.   Bronchiectasis with (acute) exacerbation (HCC) - DuoNebs/Xopenex every 6 and as needed - Continue acetylcysteine nebulizer solution - Continue Tessalon   Acute on chronic diastolic CHF (congestive heart failure) Nonischemic cardiomyopathy -BNP > 1400.  Baseline in the 300s.  No pulmonary edema on chest x-ray - Patient reports she held off her diuretic for a few days recently -Continues to endorse shortness of breath -On discharge recommend 20 mg Lasix p.o. daily   Atrial fibrillation with rapid ventricular response - Ventricular rates improved after addition of metoprolol, digoxin, amiodarone Cardiology consulted Plan: Discharged on regimen of beta-blocker, amiodarone, digoxin.  Heart rate controlled   Chronic pain - Continue gabapentin     Chronic hyponatremia with mild worsening - Appears chronic, asymptomatic  Discharge Instructions  Discharge Instructions     Diet - low sodium heart healthy   Complete by: As directed    Increase activity slowly   Complete by: As directed       Allergies as of 08/27/2020       Reactions   Codeine Nausea And Vomiting   Sulfa Antibiotics Diarrhea, Anxiety   "Makes her feel shaky."   Penicillins Rash   Has patient had a PCN reaction causing immediate rash, facial/tongue/throat swelling, SOB or lightheadedness with hypotension: Unknown Has patient had a PCN reaction causing severe rash involving mucus membranes or skin necrosis: Unknown Has patient had a PCN reaction that required hospitalization: Unknown Has patient had a PCN reaction occurring within the last 10 years: No If all of the above answers are "NO", then may proceed with Cephalosporin use.        Medication List     STOP taking these medications    sulfamethoxazole-trimethoprim 400-80 MG tablet Commonly known as: BACTRIM       TAKE these medications    acetaminophen 500 MG tablet Commonly known as: TYLENOL Take 1,000 mg by mouth every 6 (six) hours  as needed for mild pain or headache.   acetylcysteine 20 % nebulizer solution Commonly known as: MUCOMYST Take 4 mLs by nebulization daily.   Align 4 MG Caps Take 4 mg by mouth daily.   amiodarone 200 MG tablet Commonly known as: PACERONE Take 1 tablet (200 mg total) by mouth 2 (two) times daily.   apixaban 2.5 MG Tabs tablet Commonly known as: ELIQUIS Take 1 tablet (2.5 mg total) by mouth 2 (two) times daily. What changed:  medication strength how much to take   benzonatate 200 MG capsule Commonly known as: TESSALON Take 200 mg by mouth 3 (three) times daily as needed.   Combivent Respimat 20-100 MCG/ACT Aers respimat Generic drug: Ipratropium-Albuterol 1 puff every 6 (six) hours as needed for wheezing.   Dermacloud Oint Apply 1 application topically as needed.   digoxin 0.125 MG tablet Commonly known as: LANOXIN Take 125 mcg by mouth daily.   ferrous sulfate 325 (65 FE) MG EC tablet Take 325 mg by mouth daily.   furosemide 20 MG tablet Commonly known as: LASIX Take 1 tablet (20 mg total) by mouth daily. What changed:  when to take this reasons to take this   gabapentin 300 MG capsule Commonly known as: NEURONTIN Take 300 mg by mouth at bedtime as needed.   glycopyrrolate 1 MG tablet Commonly known as: ROBINUL Take 1 mg by mouth 3 (three) times daily.   levalbuterol 0.31 MG/3ML nebulizer solution Commonly  known as: XOPENEX Inhale 3 mLs into the lungs every 6 (six) hours as needed.   LORazepam 0.5 MG tablet Commonly known as: ATIVAN Take 1 tablet (0.5 mg total) by mouth at bedtime.   metoprolol tartrate 25 MG tablet Commonly known as: LOPRESSOR Take 1 tablet (25 mg total) by mouth 2 (two) times daily.   NAC 600 MG Caps Generic drug: Acetylcysteine Take 600 mg by mouth daily after breakfast.   omeprazole 20 MG capsule Commonly known as: PRILOSEC Take 20 mg by mouth daily.   polyethylene glycol 17 g packet Commonly known as: MIRALAX /  GLYCOLAX Take 17 g by mouth daily as needed for severe constipation.   potassium chloride 10 MEQ tablet Commonly known as: KLOR-CON Take 10 mEq by mouth daily.   predniSONE 10 MG tablet Commonly known as: DELTASONE Take 10 mg by mouth daily. What changed: Another medication with the same name was removed. Continue taking this medication, and follow the directions you see here.   Sodium Chloride (Inhalant) 7 % Nebu Inhale 4 mLs into the lungs daily.   vitamin C 1000 MG tablet Take 1,000 mg by mouth daily.       ASK your doctor about these medications    senna 8.6 MG Tabs tablet Commonly known as: SENOKOT Take 2 tablets (17.2 mg total) by mouth at bedtime.        Follow-up Information     [name], MD. Schedule an appointment as soon as possible for a visit in 1 week(s).   Specialty: Family Medicine Contact information: [place] [phone]                Allergies  Allergen Reactions   Codeine Nausea And Vomiting   Sulfa Antibiotics Diarrhea and Anxiety    "Makes her feel shaky."   Penicillins Rash    Has patient had a PCN reaction causing immediate rash, facial/tongue/throat swelling, SOB or lightheadedness with hypotension: Unknown Has patient had a PCN reaction causing severe rash involving mucus membranes or skin necrosis: Unknown Has patient had a PCN reaction that required hospitalization: Unknown Has patient had a PCN reaction occurring within the last 10 years: No If all of the above answers are "NO", then may proceed with Cephalosporin use.     Consultations: Cardiology Pulmonology Palliative care   Procedures/Studies: CT ABDOMEN PELVIS WO CONTRAST  Result Date: 07/31/2020 CLINICAL DATA:  Lower abdominal pain.  Tachycardia and tachypnea. EXAM: CT ABDOMEN AND PELVIS WITHOUT CONTRAST TECHNIQUE: Multidetector CT imaging of the abdomen and pelvis was performed following the standard protocol without IV  contrast. COMPARISON:  12/25/2018 and prior CT a chest 07/30/2020 FINDINGS: Lower chest: Stable large hiatal hernia containing most of the stomach. Extensive cylindrical bronchiectasis, airway thickening, and some airway plugging in the lung bases as shown on the exam from 07/30/2020. A small right pleural effusion is mildly increased from 07/30/2020. Mild cardiomegaly noted along with coronary artery and descending thoracic aortic atherosclerotic calcification. High-density material along the left lung base pleural margin, stable. Hepatobiliary: Cholecystectomy. Prominent common bile duct at 1.3 cm in diameter on image 33 series 5, previously the same on 12/25/2018, this biliary dilatation may represent a physiologic response to cholecystectomy. Pancreas: Unremarkable Spleen: Old granulomatous disease. Adrenals/Urinary Tract: Accentuated density in both renal collecting systems probably left over from recent contrast injection. Stable left mid kidney exophytic lesion compatible with cyst. Contrast medium in the urinary bladder. Adrenal glands unremarkable. Stomach/Bowel: Large hiatal hernia containing the  majority of the stomach as well as part of the transverse colon and splenic flexure. No findings of strangulation or obstruction, this herniation is chronic. Sigmoid colon diverticulosis without findings of active diverticulitis. Vascular/Lymphatic: Aortoiliac atherosclerotic vascular disease. Reproductive: Small calcified posterior uterine body fibroid. Other: No supplemental non-categorized findings. Musculoskeletal: Bony demineralization. Deformities from old bilateral pelvic fractures, stable from 12/25/2018. Right hip ORIF. Compression fractures at all lumbar levels and all visualized lower thoracic levels, with vertebral augmentations at all visible lower thoracic levels and at L1, L2, and L3. The lumbar compression fractures at L4 and L5 appear similar to the 12/25/2018 exam. IMPRESSION: 1. A specific cause  for patient's lower abdominal pain is not identified. 2. Small right pleural effusion, but increased in size compared to 07/30/2020. 3. Other imaging findings of potential clinical significance: Large hiatal hernia containing most of the stomach as well as part of the transverse colon. Prominent cylindrical bronchiectasis in both lower lobes with scattered airway plugging. Mild cardiomegaly. Aortic Atherosclerosis (ICD10-I70.0). Coronary atherosclerosis. Stable chronic extrahepatic biliary dilatation, possibly a physiologic response to cholecystectomy. Sigmoid diverticulosis. Small uterine fibroid. Old pelvic fractures. Chronic compression fractures at all visualized thoracic and lumbar levels with vertebral augmentations at all visualized thoracolumbar levels except for L4 and L5. Electronically Signed   By: Van Clines M.D.   On: 07/31/2020 20:56   DG Chest 1 View  Result Date: 07/30/2020 CLINICAL DATA:  COVID diagnosis 5 weeks prior, known metastatic disease, increasing shortness of breath with productive cough EXAM: CHEST  1 VIEW COMPARISON:  Radiograph 07/14/2020, CT 07/14/2020 FINDINGS: Large left diaphragmatic eventration. Some adjacent passive atelectatic changes. Diffuse reticulonodular opacities present throughout both lungs. Postsurgical changes in the left hilum and right upper lung are stable from priors and likely reflecting multiple resections. No pneumothorax. No visible layering effusion. Stable cardiomediastinal contours with a calcified aorta. Multilevel vertebroplasty changes are seen. No acute osseous abnormality or suspicious osseous lesion. The osseous structures appear diffusely demineralized which may limit detection of small or nondisplaced fractures. Degenerative changes in the shoulders, right greater than left with high-riding humeral head suggesting underlying rotator cuff insufficiency. Telemetry leads overlie the chest. IMPRESSION: Diffuse reticulonodular opacities  throughout the lungs, can be seen in the setting as atypical mycobacterial infection as suggested previously versus additional acute superimposed infection or inflammation. Large left diaphragmatic eventration. Chronic scarring and architectural distortion with areas of postsurgical changes in the left hilum and right upper lung. Aortic Atherosclerosis (ICD10-I70.0). Multilevel vertebroplasty. Electronically Signed   By: Lovena Le M.D.   On: 07/30/2020 20:34   CT Head Wo Contrast  Result Date: 08/23/2020 CLINICAL DATA:  Headache.  Suspect intracranial hemorrhage. EXAM: CT HEAD WITHOUT CONTRAST TECHNIQUE: Contiguous axial images were obtained from the base of the skull through the vertex without intravenous contrast. COMPARISON:  None. FINDINGS: Brain: No evidence of acute infarction, hemorrhage, hydrocephalus, extra-axial collection or mass lesion/mass effect. Mild diffuse cerebral atrophy. Low-attenuation changes in the deep white matter consistent with small vessel ischemia. Vascular: Moderate intracranial arterial vascular calcifications. Skull: Calvarium appears intact. Sinuses/Orbits: Small retention cyst in the right maxillary antrum. 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 Paranasal sinuses and mastoid air cells are otherwise clear. Other: None. IMPRESSION: No acute intracranial abnormalities. Chronic atrophy and small vessel ischemic changes. Electronically Signed   By: Lucienne Capers M.D.   On: 08/23/2020 21:59   CT Angio Chest PE W and/or Wo Contrast  Result Date: 07/30/2020 CLINICAL DATA:  Subacute COVID pneumonia, cough  EXAM: CT ANGIOGRAPHY CHEST WITH CONTRAST TECHNIQUE: Multidetector CT imaging of the chest was performed using the standard protocol during bolus administration of intravenous contrast. Multiplanar CT image reconstructions and MIPs were obtained to evaluate the vascular anatomy. CONTRAST:  67m OMNIPAQUE IOHEXOL 350 MG/ML SOLN COMPARISON:   07/14/2020 FINDINGS: Cardiovascular: There is adequate opacification of the residual pulmonary arterial tree. No intraluminal filling defect to suggest acute pulmonary embolism. The central pulmonary arteries are enlarged in keeping with changes of pulmonary arterial hypertension, unchanged from prior examination. Mild multi-vessel coronary artery calcification. Global cardiac size within normal limits. No pericardial effusion. Moderate atherosclerotic calcification within the abdominal aorta. No aortic aneurysm. Mediastinum/Nodes: No pathologic thoracic adenopathy. The visualized thyroid is unremarkable. Small amount of debris is seen within the esophagus possibly reflecting changes of esophageal dysmotility or gastroesophageal reflux. A large hiatal hernia is seen extending into the left hemithorax with herniation of the splenic flexure of the colon and nearly the entire stomach into the left hemithorax. There is marked tortuosity of the thoracic aorta which extends into the posterior left hemithorax as result of the a large hernia. Lungs/Pleura: Status post partial right upper lobectomy and right middle lobectomy as well as left lower lobectomy. 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 There is superimposed areas of cylindrical and varicoid bronchiectasis again identified, more focal within the residual right upper lobe and basilar right lower lobe though chronic infection with atypical organisms such as MAI could result in such an appearance, an additional consideration should include the sequela of remote or recurrent infection and/or aspiration. There is these changes appears stable since prior examination. No new focal pulmonary nodules or infiltrates. No pneumothorax or pleural effusion. Upper Abdomen: Status post cholecystectomy.  No acute abnormality. Musculoskeletal: Multilevel thoracolumbar vertebroplasty has  been performed of each visualized level inferior to T4. Stable remote compression fracture of T3. No acute bone abnormality. Review of the MIP images confirms the above findings. IMPRESSION: No pulmonary embolism. Mild coronary artery calcification. Morphologic changes in keeping with pulmonary arterial hypertension. Status post partial right upper lobe, right middle lobe, and left lower lobectomy. Extensive scattered areas of cylindrical and varicoid bronchiectasis with mild tree-in-bud nodularity stable since prior examination. Differential considerations include the sequela of remote or recurrent infection, aspiration, and atypical infection as can be seen with mycobacterial or atypical fungal infection. Mosaic attenuation of the pulmonary parenchyma in keeping with air trapping secondary to small airways disease. Aortic Atherosclerosis (ICD10-I70.0). Electronically Signed   By: AFidela SalisburyMD   On: 07/30/2020 22:16   DG Chest Port 1 View  Result Date: 08/23/2020 CLINICAL DATA:  Short of breath. EXAM: PORTABLE CHEST 1 VIEW COMPARISON:  08/02/2020 and older exams.  CT, 07/30/2020. FINDINGS: Cardiac silhouette is mostly obscured. There is bi basilar opacity consistent with small effusions. Post lung surgery changes are noted with left perihilar pulmonary anastomosis staples. There are also pulmonary anastomosis staples in the right upper lung extending from the right hilum. Lungs demonstrate prominent vascular and thickened interstitial markings similar to the prior exam. Additional opacity is noted at the left lung base consistent with atelectasis. No pneumothorax. No mediastinal or hilar masses. Numerous vertebral fractures have been treated with vertebroplasty/kyphoplasty, unchanged. IMPRESSION: 1. Findings are similar to the most recent prior chest radiograph. No convincing pneumonia or pulmonary edema. 2. Bilateral lung base opacities consistent with small effusions, greater on the left. Left greater  than right basilar  atelectasis. 3. Significant chronic interstitial lung disease. Stable changes from prior bilateral lung surgery. Electronically Signed   By: Lajean Manes M.D.   On: 08/23/2020 20:57   DG Chest Port 1 View  Result Date: 08/02/2020 CLINICAL DATA:  Chronic hypoxemia. EXAM: PORTABLE CHEST 1 VIEW COMPARISON:  07/30/2020 FINDINGS: Stable cardiomediastinal contours. Postoperative changes with suture chains noted in the perihilar right lung and right upper lobe as well as the left midlung and perihilar left mid lung. Diffuse chronic interstitial advanced chronic interstitial changes are identified throughout both lungs reflecting underlying postinflammatory changes including bronchiectasis, interstitial scarring and reticulation. Small right pleural effusion unchanged. Large hiatal hernia is identified which overlies the left heart and left lower lung. Unchanged. IMPRESSION: 1. Severe chronic interstitial lung disease appears unchanged from previous exam. 2. Stable appearance of large hiatal hernia. Electronically Signed   By: Kerby Moors M.D.   On: 08/02/2020 12:31   (Echo, Carotid, EGD, Colonoscopy, ERCP)    Subjective: Patient seen and examined on the day of discharge.  Respiratory status at baseline.  Patient does not wish to be rehospitalized but also does not wish to lose her home physical therapy services./Discharge plan will be to go home with outpatient palliative following with plans for a transition to hospice should the patient deteriorate after discharge.  Discharge Exam: Vitals:   08/27/20 1004 08/27/20 1308  BP: (!) 143/75 (!) 161/81  Pulse: 61 (!) 55  Resp: 14 18  Temp: (!) 97.5 F (36.4 C) (!) 97.5 F (36.4 C)  SpO2: 97% 100%   Vitals:   08/26/20 1932 08/27/20 0421 08/27/20 1004 08/27/20 1308  BP:  129/82 (!) 143/75 (!) 161/81  Pulse:  (!) 55 61 (!) 55  Resp:  _0 Temp:  97.9 F (36.6 C) (!) 97.5 F (36.4 C) (!) 97.5 F (36.4 C)  TempSrc:   Oral    SpO2: 100% 100% 97% 100%  Weight:      Height:        General: Alert awake, no acute distress, appears fatigued, chronically ill Cardiovascular: RRR, S1/S2 +, no rubs, no gallops Respiratory: Bibasilar rhonchi bilaterally.  Increased work of breathing.  2 L Abdominal: Soft, NT, ND, bowel sounds + Extremities: no edema, no cyanosis    The results of significant diagnostics from this hospitalization (including imaging, microbiology, ancillary and laboratory) are listed below for reference.     Microbiology: Recent Results (from the past 240 hour(s))  Resp Panel by RT-PCR (Flu A&B, Covid) Nasopharyngeal Swab     Status: Abnormal   Collection Time: 08/24/20 11:12 AM   Specimen: Nasopharyngeal Swab; Nasopharyngeal(NP) swabs in vial transport medium  Result Value Ref Range Status   SARS Coronavirus 2 by RT PCR POSITIVE (A) NEGATIVE Final    Comment: CRITICAL RESULT CALLED TO, READ BACK BY AND VERIFIED WITH:  Reyes Ivan, RN AT 1224 08/24/2020. GAA. (NOTE) SARS-CoV-2 target nucleic acids are DETECTED.  The SARS-CoV-2 RNA is generally detectable in upper respiratory specimens during the acute phase of infection. Positive results are indicative of the presence of the identified virus, but do not rule out bacterial infection or co-infection with other pathogens not detected by the test. Clinical correlation with patient history and other diagnostic information is necessary to determine patient infection status. The expected result is Negative.  Fact Sheet for Patients: EntrepreneurPulse.com.au  Fact Sheet for Healthcare Providers: IncredibleEmployment.be  This test is not yet approved or cleared by the [place] FDA and  has been  authorized for detection and/or diagnosis of SARS-CoV-2 by FDA under an Emergency Use Authorization (EUA).  This EUA will remain in effect (meani ng this test can be used) for the duration of  the COVID-19  declaration under Section 564(b)(1) of the Act, 21 U.S.C. section 360bbb-3(b)(1), unless the authorization is terminated or revoked sooner.     Influenza A by PCR NEGATIVE NEGATIVE Final   Influenza B by PCR NEGATIVE NEGATIVE Final    Comment: (NOTE) The Xpert Xpress SARS-CoV-2/FLU/RSV plus assay is intended as an aid in the diagnosis of influenza from Nasopharyngeal swab specimens and should not be used as a sole basis for treatment. Nasal washings and aspirates are unacceptable for Xpert Xpress SARS-CoV-2/FLU/RSV testing.  Fact Sheet for Patients: EntrepreneurPulse.com.au  Fact Sheet for Healthcare Providers: IncredibleEmployment.be  This test is not yet approved or cleared by the [place] FDA and has been authorized for detection and/or diagnosis of SARS-CoV-2 by FDA under an Emergency Use Authorization (EUA). This EUA will remain in effect (meaning this test can be used) for the duration of the COVID-19 declaration under Section 564(b)(1) of the Act, 21 U.S.C. section 360bbb-3(b)(1), unless the authorization is terminated or revoked.  Performed at [facility], [address] [place], [place] [address]      Labs: BNP (last 3 results) Recent Labs    07/24/20 0940 08/23/20 2013 08/25/20 0912  BNP 340.9* 1,408.1* 4,782.9*   Basic Metabolic Panel: Recent Labs  Lab 08/23/20 2013 08/25/20 0912 08/26/20 0519 08/27/20 0442  NA 123* 125* 124* 124*  K 4.8 3.4* 3.9 3.4*  CL 82* 73* 76* 74*  CO2 34* 40* 37* 36*  GLUCOSE 271* 77 78 71  BUN _0 CREATININE 0.55 0.60 0.61 0.65  CALCIUM 8.1* 8.4* 8.3* 8.5*  MG  --   --  1.6*  --    Liver Function Tests: No results for input(s): AST, ALT, ALKPHOS, BILITOT, PROT, ALBUMIN in the last 168 hours. No results for input(s): LIPASE, AMYLASE in the last 168 hours. No results for input(s): AMMONIA in the last 168 hours. CBC: Recent Labs  Lab 08/23/20 2013  08/25/20 0912 08/26/20 0519 08/27/20 0442  WBC 21.8* 12.7* 10.8* 9.5  NEUTROABS 20.1* 8.9* 7.8* 6.1  HGB 11.6* 11.9* 11.4* 11.4*  HCT 33.9* 34.0* 32.1* 32.2*  MCV 100.9* 97.1 95.5 97.3  PLT 282 244 231 249   Cardiac Enzymes: No results for input(s): CKTOTAL, CKMB, CKMBINDEX, TROPONINI in the last 168 hours. BNP: Invalid input(s): POCBNP CBG: No results for input(s): GLUCAP in the last 168 hours. D-Dimer No results for input(s): DDIMER in the last 72 hours. Hgb A1c No results for input(s): HGBA1C in the last 72 hours. Lipid Profile No results for input(s): CHOL, HDL, LDLCALC, TRIG, CHOLHDL, LDLDIRECT in the last 72 hours. Thyroid function studies No results for input(s): TSH, T4TOTAL, T3FREE, THYROIDAB in the last 72 hours.  Invalid input(s): FREET3 Anemia work up No results for input(s): VITAMINB12, FOLATE, FERRITIN, TIBC, IRON, RETICCTPCT in the last 72 hours. Urinalysis    Component Value Date/Time   COLORURINE YELLOW (A) 08/25/2020 1430   APPEARANCEUR HAZY (A) 08/25/2020 1430   LABSPEC 1.008 08/25/2020 1430   PHURINE 9.0 (H) 08/25/2020 1430   GLUCOSEU NEGATIVE 08/25/2020 1430   HGBUR LARGE (A) 08/25/2020 1430   BILIRUBINUR NEGATIVE 08/25/2020 1430   KETONESUR NEGATIVE 08/25/2020 1430   PROTEINUR NEGATIVE 08/25/2020 1430   NITRITE NEGATIVE 08/25/2020 1430   LEUKOCYTESUR NEGATIVE 08/25/2020 1430  Sepsis Labs Invalid input(s): PROCALCITONIN,  WBC,  LACTICIDVEN Microbiology Recent Results (from the past 240 hour(s))  Resp Panel by RT-PCR (Flu A&B, Covid) Nasopharyngeal Swab     Status: Abnormal   Collection Time: 08/24/20 11:12 AM   Specimen: Nasopharyngeal Swab; Nasopharyngeal(NP) swabs in vial transport medium  Result Value Ref Range Status   SARS Coronavirus 2 by RT PCR POSITIVE (A) NEGATIVE Final    Comment: CRITICAL RESULT CALLED TO, READ BACK BY AND VERIFIED WITH:  Reyes Ivan, RN AT 1224 08/24/2020. GAA. (NOTE) SARS-CoV-2 target nucleic acids are  DETECTED.  The SARS-CoV-2 RNA is generally detectable in upper respiratory specimens during the acute phase of infection. Positive results are indicative of the presence of the identified virus, but do not rule out bacterial infection or co-infection with other pathogens not detected by the test. Clinical correlation with patient history and other diagnostic information is necessary to determine patient infection status. The expected result is Negative.  Fact Sheet for Patients: EntrepreneurPulse.com.au  Fact Sheet for Healthcare Providers: IncredibleEmployment.be  This test is not yet approved or cleared by the [place] FDA and  has been authorized for detection and/or diagnosis of SARS-CoV-2 by FDA under an Emergency Use Authorization (EUA).  This EUA will remain in effect (meani ng this test can be used) for the duration of  the COVID-19 declaration under Section 564(b)(1) of the Act, 21 U.S.C. section 360bbb-3(b)(1), unless the authorization is terminated or revoked sooner.     Influenza A by PCR NEGATIVE NEGATIVE Final   Influenza B by PCR NEGATIVE NEGATIVE Final    Comment: (NOTE) The Xpert Xpress SARS-CoV-2/FLU/RSV plus assay is intended as an aid in the diagnosis of influenza from Nasopharyngeal swab specimens and should not be used as a sole basis for treatment. Nasal washings and aspirates are unacceptable for Xpert Xpress SARS-CoV-2/FLU/RSV testing.  Fact Sheet for Patients: EntrepreneurPulse.com.au  Fact Sheet for Healthcare Providers: IncredibleEmployment.be  This test is not yet approved or cleared by the [place] FDA and has been authorized for detection and/or diagnosis of SARS-CoV-2 by FDA under an Emergency Use Authorization (EUA). This EUA will remain in effect (meaning this test can be used) for the duration of the COVID-19 declaration under Section 564(b)(1) of the Act, 21  U.S.C. section 360bbb-3(b)(1), unless the authorization is terminated or revoked.  Performed at [facility], [address], [place], [place] [address]      Time coordinating discharge: Over 30 minutes  SIGNED:   Sidney Ace, MD  [facility] 08/27/2020, 1:46 PM Pager   If 7PM-7AM, please contact night-coverage

## 2020-08-27 NOTE — Progress Notes (Signed)
[facility] Cardiology    SUBJECTIVE: Patient states to be doing reasonably well felt like she was relatively back at baseline no worsening shortness of breath no chest pain mild congestion and cough.   Vitals:   08/26/20 1604 08/26/20 1916 08/26/20 1932 08/27/20 0421  BP: (!) 147/80 138/74  129/82  Pulse: (!) 57 (!) 57  (!) 55  Resp: '16 16  16  '$ Temp: 98 F (36.7 C) 98 F (36.7 C)  97.9 F (36.6 C)  TempSrc:      SpO2: 100% 100% 100% 100%  Weight:      Height:         Intake/Output Summary (Last 24 hours) at 08/27/2020 0915 Last data filed at 08/26/2020 2100 Gross per 24 hour  Intake 240 ml  Output --  Net 240 ml      PHYSICAL EXAM  General: Well developed, well nourished, in no acute distress HEENT:  Normocephalic and atramatic Neck:  No JVD.  Lungs: Diminished bilaterally to auscultation and percussion. Heart: Irregular irregular. Normal S1 and S2 without gallops or murmurs.  Abdomen: Bowel sounds are positive, abdomen soft and non-tender  Msk:  Back normal, normal gait. Normal strength and tone for age. Extremities: No clubbing, cyanosis or edema.   Neuro: Alert and oriented X 3. Psych:  Good affect, responds appropriately   LABS: Basic Metabolic Panel: Recent Labs    08/26/20 0519 08/27/20 0442  NA 124* 124*  K 3.9 3.4*  CL 76* 74*  CO2 37* 36*  GLUCOSE 78 71  BUN 14 14  CREATININE 0.61 0.65  CALCIUM 8.3* 8.5*  MG 1.6*  --    Liver Function Tests: No results for input(s): AST, ALT, ALKPHOS, BILITOT, PROT, ALBUMIN in the last 72 hours. No results for input(s): LIPASE, AMYLASE in the last 72 hours. CBC: Recent Labs    08/26/20 0519 08/27/20 0442  WBC 10.8* 9.5  NEUTROABS 7.8* 6.1  HGB 11.4* 11.4*  HCT 32.1* 32.2*  MCV 95.5 97.3  PLT 231 249   Cardiac Enzymes: No results for input(s): CKTOTAL, CKMB, CKMBINDEX, TROPONINI in the last 72 hours. BNP: Invalid input(s): POCBNP D-Dimer: No results for input(s): DDIMER in the last 72 hours. Hemoglobin  A1C: No results for input(s): HGBA1C in the last 72 hours. Fasting Lipid Panel: No results for input(s): CHOL, HDL, LDLCALC, TRIG, CHOLHDL, LDLDIRECT in the last 72 hours. Thyroid Function Tests: No results for input(s): TSH, T4TOTAL, T3FREE, THYROIDAB in the last 72 hours.  Invalid input(s): FREET3 Anemia Panel: No results for input(s): VITAMINB12, FOLATE, FERRITIN, TIBC, IRON, RETICCTPCT in the last 72 hours.  No results found.   Echo preserved left ventricular function  TELEMETRY: AFIB 60 nsstw:  ASSESSMENT AND PLAN:  Principal Problem:   Morphine Overdose, accidental or unintentional, initial encounter Active Problems:   Essential hypertension   Coronary artery disease involving native coronary artery of native heart without angina pectoris   Non-ischemic cardiomyopathy ([place])   Long term current use of opiate analgesic   Chronic anticoagulation ([place])   Atrial fibrillation, chronic (HCC)   Bronchiectasis with (acute) exacerbation (HCC)   Chronic respiratory failure with hypoxia (HCC)   Chronic hyponatremia   Acute on chronic diastolic CHF (congestive heart failure) (HCC)   CHF (congestive heart failure) ([place])    1.  Shortness of breath improved related to bronchiectasis as well as chronic diastolic heart failure continue current medical therapy with inhalers 2 morphine overdose accidental unintentional recovered continue current management 3 acute dyspnea related to  respiratory failure hypoxemia partly related to morphine overdose as well as bronchiectasis and underlying chronic hypoxemia continue inhalers as necessary and supplemental oxygen 4 chronic bronchiectasis now with acute exacerbation treated with nebs inhalers [place] no oxygen 5 atrial fibrillation rapid ventricular response somewhat improved currently on metoprolol digoxin and amiodarone patient relatively stable anticoagulation poor candidate 6 GERD continue Protonix therapy for reflux type symptoms 7  have the patient follow-up with cardiology as an outpatient 1 to 2 weeks   Yolonda Kida, MD, 08/27/2020 9:15 AM

## 2020-08-27 NOTE — Progress Notes (Signed)
Physical Therapy Treatment Patient Details Name: Ana Washington MRN: [id number] DOB: 1945/04/14 Today's Date: 08/27/2020    History of Present Illness Ana Washington is a 75 y.o. 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 Pt admitted unintentional overdose of morphine, reversed with Narcan by EMS.    PT Comments    Pt received supine in bed, eager to participate with PT. She ambulated 57f within room. Strength appeared to improve with decreased trembling in BLE as ambulation distance increased, however SOB and fatigue were noted upon completion. SpO2 was recorded ay 99% once seated after ambulation. Pt remained EOB for an additional 3 minutes to recover her breathing pattern. Due to pending d/c pt asked to not participate in further PT as she did not want to wear herself out before d/c home. Would benefit from skilled PT to address above deficits and promote optimal return to PLOF.   Follow Up Recommendations  Home health PT;Supervision/Assistance - 24 hour     Equipment Recommendations  Rolling walker with 5" wheels    Recommendations for Other Services       Precautions / Restrictions Precautions Precautions: Fall    Mobility  Bed Mobility Overal bed mobility: Modified Independent                  Transfers Overall transfer level: Needs assistance Equipment used: Rolling walker (2 wheeled) Transfers: Sit to/from Stand Sit to Stand: Min assist         General transfer comment: using RW, assist for initial lift off  Ambulation/Gait Ambulation/Gait assistance: Min guard Gait Distance (Feet): 25 Feet Assistive device: Rolling walker (2 wheeled) Gait Pattern/deviations:  Step-through pattern Gait velocity: decreased   General Gait Details: reciprocal stepping pattern with fair step height/length; narrowed BOS; slow and cautious performance.  Mod SOB with minimal distance/exertion; Sats 99% at end of walk.   Stairs             Wheelchair Mobility    Modified Rankin (Stroke Patients Only)       Balance Overall balance assessment: Needs assistance Sitting-balance support: No upper extremity supported;Feet supported Sitting balance-Leahy Scale: Good     Standing balance support: Bilateral upper extremity supported Standing balance-Leahy Scale: Fair                              Cognition Arousal/Alertness: Awake/alert Behavior During Therapy: WFL for tasks assessed/performed Overall Cognitive Status: Within Functional Limits for tasks assessed                                        Exercises      General Comments        Pertinent Vitals/Pain Pain Assessment: No/denies pain    Home Living                      Prior Function            PT Goals (current goals can now be found in the care plan section) Acute Rehab PT Goals Patient Stated Goal: go home PT Goal Formulation: With patient/family Time For Goal Achievement:  09/09/20 Potential to Achieve Goals: Fair    Frequency    Min 2X/week      PT Plan      Co-evaluation              AM-PAC PT "6 Clicks" Mobility   Outcome Measure  Help needed turning from your back to your side while in a flat bed without using bedrails?: None Help needed moving from lying on your back to sitting on the side of a flat bed without using bedrails?: None Help needed moving to and from a bed to a chair (including a wheelchair)?: A Little Help needed standing up from a chair using your arms (e.g., wheelchair or bedside chair)?: A Little Help needed to walk in hospital room?: A Little Help needed climbing 3-5 steps with a railing? : A  Little 6 Click Score: 20    End of Session Equipment Utilized During Treatment: Gait belt;Oxygen Activity Tolerance: Patient tolerated treatment well Patient left: in chair;with call bell/phone within reach;with chair alarm set Nurse Communication: Mobility status PT Visit Diagnosis: Muscle weakness (generalized) (M62.81);Difficulty in walking, not elsewhere classified (R26.2)     Time: 1027-2536 PT Time Calculation (min) (ACUTE ONLY): 18 min  Charges:  $Therapeutic Activity: 8-22 mins                     Patrina Levering PT, DPT 08/27/20 4:19 PM [phone]    [facility] 08/27/2020, 4:15 PM

## 2020-08-27 NOTE — TOC Transition Note (Signed)
Transition of Care [facility]) - CM/SW Discharge Note   Patient Details  Name: Ana Washington MRN: [id number] Date of Birth: 1945-04-25  [facility] of [name]) CM/SW Contact:  Alberteen Sam, LCSW Phone Number: 08/27/2020, 1:31 PM   Clinical Narrative:     Patient will DC to: Home with [facility] Anticipated DC date: 08/27/20   Per MD patient ready for DC to home. CSW spoke with patient as she would like to continue [facility] services, would like to continue with [facility] PT, RN and aide. [place] with [place] update on patient's dc today. Patient to continue to be followed by outpatient palliative with [facility]. Patient reports no further dc needs at this time. CSW signing off.  Pricilla Riffle, LCSW    Final next level of care: [place] Barriers to Discharge: No Barriers Identified   Patient Goals and CMS Choice Patient states their goals for this hospitalization and ongoing recovery are:: to go home CMS Medicare.gov Compare Post Acute Care list provided to:: Patient Choice offered to / list presented to : Patient  Discharge Placement                    Patient and family notified of of transfer: 08/27/20  Discharge Plan and Services     Post Acute Care Choice: Home Health                    HH Arranged: PT, RN, OT, Nurse's Aide [place] Agency: [place] Date [place]: 08/27/20 Time [place]: [place] Representative spoke with at [place]: [place]  Social Determinants of Health ([place]) Interventions     Readmission Risk Interventions Readmission Risk Prevention Plan 07/17/2020 01/06/2018  Transportation Screening Complete Complete  PCP or Specialist Appt within 5-7 Days - Complete  PCP or Specialist Appt within 3-5 Days Complete -  Home Care Screening - Complete  Medication Review (RN CM) - Complete  HRI or Home Care Consult Complete -  Social Work Consult for [place] Planning/Counseling Complete -   Palliative Care Screening Not Applicable -  Medication Review Press photographer) Complete -  Some recent data might be hidden

## 2020-09-03 ENCOUNTER — Other Ambulatory Visit: Payer: Self-pay

## 2020-09-03 ENCOUNTER — Emergency Department: Payer: Medicare HMO

## 2020-09-03 ENCOUNTER — Encounter: Payer: Self-pay | Admitting: *Deleted

## 2020-09-03 DIAGNOSIS — Z9981 Dependence on supplemental oxygen: Secondary | ICD-10-CM | POA: Insufficient documentation

## 2020-09-03 DIAGNOSIS — K5710 Diverticulosis of small intestine without perforation or abscess without bleeding: Secondary | ICD-10-CM | POA: Insufficient documentation

## 2020-09-03 DIAGNOSIS — I480 Paroxysmal atrial fibrillation: Secondary | ICD-10-CM | POA: Insufficient documentation

## 2020-09-03 DIAGNOSIS — I110 Hypertensive heart disease with heart failure: Secondary | ICD-10-CM | POA: Diagnosis not present

## 2020-09-03 DIAGNOSIS — Z8616 Personal history of COVID-19: Secondary | ICD-10-CM | POA: Diagnosis not present

## 2020-09-03 DIAGNOSIS — Q399 Congenital malformation of esophagus, unspecified: Secondary | ICD-10-CM | POA: Diagnosis not present

## 2020-09-03 DIAGNOSIS — R1310 Dysphagia, unspecified: Secondary | ICD-10-CM | POA: Diagnosis not present

## 2020-09-03 DIAGNOSIS — I471 Supraventricular tachycardia: Secondary | ICD-10-CM | POA: Diagnosis not present

## 2020-09-03 DIAGNOSIS — Z7901 Long term (current) use of anticoagulants: Secondary | ICD-10-CM | POA: Insufficient documentation

## 2020-09-03 DIAGNOSIS — T182XXA Foreign body in stomach, initial encounter: Secondary | ICD-10-CM | POA: Diagnosis not present

## 2020-09-03 DIAGNOSIS — Z882 Allergy status to sulfonamides status: Secondary | ICD-10-CM | POA: Diagnosis not present

## 2020-09-03 DIAGNOSIS — I2510 Atherosclerotic heart disease of native coronary artery without angina pectoris: Secondary | ICD-10-CM | POA: Insufficient documentation

## 2020-09-03 DIAGNOSIS — I5033 Acute on chronic diastolic (congestive) heart failure: Secondary | ICD-10-CM | POA: Diagnosis not present

## 2020-09-03 DIAGNOSIS — I351 Nonrheumatic aortic (valve) insufficiency: Secondary | ICD-10-CM | POA: Diagnosis not present

## 2020-09-03 DIAGNOSIS — X58XXXA Exposure to other specified factors, initial encounter: Secondary | ICD-10-CM | POA: Insufficient documentation

## 2020-09-03 DIAGNOSIS — K449 Diaphragmatic hernia without obstruction or gangrene: Secondary | ICD-10-CM | POA: Insufficient documentation

## 2020-09-03 DIAGNOSIS — Z880 Allergy status to penicillin: Secondary | ICD-10-CM | POA: Insufficient documentation

## 2020-09-03 DIAGNOSIS — T18128A Food in esophagus causing other injury, initial encounter: Secondary | ICD-10-CM | POA: Diagnosis present

## 2020-09-03 DIAGNOSIS — Z992 Dependence on renal dialysis: Secondary | ICD-10-CM | POA: Insufficient documentation

## 2020-09-03 DIAGNOSIS — Z79899 Other long term (current) drug therapy: Secondary | ICD-10-CM | POA: Insufficient documentation

## 2020-09-03 DIAGNOSIS — Z885 Allergy status to narcotic agent status: Secondary | ICD-10-CM | POA: Diagnosis not present

## 2020-09-03 DIAGNOSIS — Z7952 Long term (current) use of systemic steroids: Secondary | ICD-10-CM | POA: Insufficient documentation

## 2020-09-03 DIAGNOSIS — Z8249 Family history of ischemic heart disease and other diseases of the circulatory system: Secondary | ICD-10-CM | POA: Diagnosis not present

## 2020-09-03 DIAGNOSIS — T18108S Unspecified foreign body in esophagus causing other injury, sequela: Secondary | ICD-10-CM | POA: Diagnosis not present

## 2020-09-03 DIAGNOSIS — R1319 Other dysphagia: Secondary | ICD-10-CM | POA: Diagnosis not present

## 2020-09-03 LAB — BASIC METABOLIC PANEL
Anion gap: 12 (ref 5–15)
BUN: 12 mg/dL (ref 8–23)
CO2: 34 mmol/L — ABNORMAL HIGH (ref 22–32)
Calcium: 9.4 mg/dL (ref 8.9–10.3)
Chloride: 81 mmol/L — ABNORMAL LOW (ref 98–111)
Creatinine, Ser: 0.71 mg/dL (ref 0.44–1.00)
GFR, Estimated: >60 mL/min (ref >60)
Glucose, Bld: 95 mg/dL (ref 70–99)
Potassium: 3.9 mmol/L (ref 3.5–5.1)
Sodium: 127 mmol/L — ABNORMAL LOW (ref 135–145)

## 2020-09-03 LAB — CBC
HCT: 33.9 % — ABNORMAL LOW (ref 36.0–46.0)
Hemoglobin: 11.9 g/dL — ABNORMAL LOW (ref 12.0–15.0)
MCH: 34.3 pg — ABNORMAL HIGH (ref 26.0–34.0)
MCHC: 35.1 g/dL (ref 30.0–36.0)
MCV: 97.7 fL (ref 80.0–100.0)
Platelets: 344 10*3/uL (ref 150–400)
RBC: 3.47 MIL/uL — ABNORMAL LOW (ref 3.87–5.11)
RDW: 12.8 % (ref 11.5–15.5)
WBC: 11.3 10*3/uL — ABNORMAL HIGH (ref 4.0–10.5)
nRBC: 0 % (ref 0.0–0.2)

## 2020-09-03 LAB — TROPONIN I (HIGH SENSITIVITY): Troponin I (High Sensitivity): 37 ng/L — ABNORMAL HIGH (ref <18)

## 2020-09-03 NOTE — ED Triage Notes (Signed)
Pt brought in via ems from home after eating chicken tonight and choking on it.  Pt states it is hung in her throat   pt unable to swallow water.  Pt talking in complete sentences.  No drooling..  pt alert  speech clear.

## 2020-09-03 NOTE — ED Notes (Signed)
Pt brought in by [facility] states was eating boneless chicken fajitas and now feels something is stuck.

## 2020-09-04 ENCOUNTER — Emergency Department: Payer: Medicare HMO | Admitting: Anesthesiology

## 2020-09-04 ENCOUNTER — Encounter
Admission: EM | Disposition: A | Discharge status: Home / Self Care | Payer: Self-pay | Source: Home / Self Care | Attending: Emergency Medicine

## 2020-09-04 ENCOUNTER — Ambulatory Visit
Admission: EM | Admit: 2020-09-04 | Discharge: 2020-09-04 | Disposition: A | Discharge status: Home / Self Care | Payer: Medicare HMO | Attending: Gastroenterology | Admitting: Gastroenterology

## 2020-09-04 DIAGNOSIS — T18108A Unspecified foreign body in esophagus causing other injury, initial encounter: Secondary | ICD-10-CM

## 2020-09-04 DIAGNOSIS — K449 Diaphragmatic hernia without obstruction or gangrene: Secondary | ICD-10-CM

## 2020-09-04 DIAGNOSIS — T182XXA Foreign body in stomach, initial encounter: Secondary | ICD-10-CM

## 2020-09-04 DIAGNOSIS — T18108S Unspecified foreign body in esophagus causing other injury, sequela: Secondary | ICD-10-CM

## 2020-09-04 DIAGNOSIS — T18128A Food in esophagus causing other injury, initial encounter: Secondary | ICD-10-CM

## 2020-09-04 DIAGNOSIS — Q399 Congenital malformation of esophagus, unspecified: Secondary | ICD-10-CM

## 2020-09-04 DIAGNOSIS — R1319 Other dysphagia: Secondary | ICD-10-CM

## 2020-09-04 DIAGNOSIS — K5711 Diverticulosis of small intestine without perforation or abscess with bleeding: Secondary | ICD-10-CM

## 2020-09-04 DIAGNOSIS — W44F1XA Bezoar entering into or through a natural orifice, initial encounter: Secondary | ICD-10-CM

## 2020-09-04 DIAGNOSIS — W44F3XA Food entering into or through a natural orifice, initial encounter: Secondary | ICD-10-CM

## 2020-09-04 HISTORY — PX: ESOPHAGOGASTRODUODENOSCOPY: SHX5428

## 2020-09-04 LAB — TROPONIN I (HIGH SENSITIVITY): Troponin I (High Sensitivity): 50 ng/L — ABNORMAL HIGH (ref <18)

## 2020-09-04 LAB — DIGOXIN LEVEL: Digoxin Level: 2 ng/mL (ref 0.8–2.0)

## 2020-09-04 SURGERY — EGD (ESOPHAGOGASTRODUODENOSCOPY)
Anesthesia: General

## 2020-09-04 MED ORDER — DEXAMETHASONE SODIUM PHOSPHATE 10 MG/ML IJ SOLN
INTRAMUSCULAR | Status: DC | PRN
Start: 2020-09-04 — End: 2020-09-04
  Administered 2020-09-04: 5 mg via INTRAVENOUS

## 2020-09-04 MED ORDER — SUCCINYLCHOLINE CHLORIDE 200 MG/10ML IV SOSY
PREFILLED_SYRINGE | INTRAVENOUS | Status: DC | PRN
  Administered 2020-09-04: 40 mg via INTRAVENOUS

## 2020-09-04 MED ORDER — ALBUTEROL SULFATE HFA 108 (90 BASE) MCG/ACT IN AERS
INHALATION_SPRAY | RESPIRATORY_TRACT | Status: AC
  Filled 2020-09-04: qty 6.7

## 2020-09-04 MED ORDER — METOPROLOL TARTRATE 25 MG PO TABS: 25.0000 mg | ORAL_TABLET | Freq: Once | ORAL | Status: DC

## 2020-09-04 MED ORDER — LORAZEPAM 2 MG/ML IJ SOLN
0.5000 mg | Freq: Once | INTRAMUSCULAR | Status: AC
  Administered 2020-09-04: 0.5 mg via INTRAVENOUS
  Filled 2020-09-04: qty 1

## 2020-09-04 MED ORDER — PROPOFOL 500 MG/50ML IV EMUL
INTRAVENOUS | Status: DC | PRN
  Administered 2020-09-04: 40 ug/kg/min via INTRAVENOUS

## 2020-09-04 MED ORDER — SODIUM CHLORIDE 0.9 % IV SOLN: Freq: Once | INTRAVENOUS | Status: AC

## 2020-09-04 MED ORDER — ONDANSETRON HCL 4 MG/2ML IJ SOLN
4.0000 mg | Freq: Once | INTRAMUSCULAR | Status: AC
  Administered 2020-09-04: 4 mg via INTRAVENOUS
  Filled 2020-09-04: qty 2

## 2020-09-04 MED ORDER — PROPOFOL 10 MG/ML IV BOLUS
INTRAVENOUS | Status: DC | PRN
  Administered 2020-09-04 (×2): 20 mg via INTRAVENOUS
  Administered 2020-09-04: 70 mg via INTRAVENOUS

## 2020-09-04 MED ORDER — ONDANSETRON HCL 4 MG/2ML IJ SOLN: 4.0000 mg | Freq: Once | INTRAMUSCULAR | Status: DC | PRN

## 2020-09-04 MED ORDER — PHENYLEPHRINE HCL (PRESSORS) 10 MG/ML IV SOLN
INTRAVENOUS | Status: DC | PRN
  Administered 2020-09-04 (×5): 100 ug via INTRAVENOUS

## 2020-09-04 MED ORDER — PROPOFOL 10 MG/ML IV BOLUS
INTRAVENOUS | Status: AC
  Filled 2020-09-04: qty 20

## 2020-09-04 MED ORDER — ONDANSETRON HCL 4 MG/2ML IJ SOLN
INTRAMUSCULAR | Status: DC | PRN
  Administered 2020-09-04: 4 mg via INTRAVENOUS

## 2020-09-04 MED ORDER — SODIUM CHLORIDE 0.9 % IV SOLN: INTRAVENOUS | Status: DC | PRN

## 2020-09-04 MED ORDER — AMIODARONE HCL 200 MG PO TABS: 200.0000 mg | ORAL_TABLET | Freq: Once | ORAL | Status: DC

## 2020-09-04 MED ORDER — PROPOFOL 500 MG/50ML IV EMUL
INTRAVENOUS | Status: AC
  Filled 2020-09-04: qty 50

## 2020-09-04 MED ORDER — GLUCAGON HCL RDNA (DIAGNOSTIC) 1 MG IJ SOLR
1.0000 mg | Freq: Once | INTRAMUSCULAR | Status: AC
  Administered 2020-09-04: 1 mg via INTRAVENOUS
  Filled 2020-09-04: qty 1

## 2020-09-04 MED ORDER — LIDOCAINE HCL (CARDIAC) PF 100 MG/5ML IV SOSY
PREFILLED_SYRINGE | INTRAVENOUS | Status: DC | PRN
  Administered 2020-09-04: 40 mg via INTRAVENOUS

## 2020-09-04 MED ORDER — ALBUTEROL SULFATE HFA 108 (90 BASE) MCG/ACT IN AERS
INHALATION_SPRAY | RESPIRATORY_TRACT | Status: DC | PRN
  Administered 2020-09-04 (×2): 2 via RESPIRATORY_TRACT

## 2020-09-04 NOTE — Anesthesia Postprocedure Evaluation (Signed)
Anesthesia Post Note  Patient: Ana Washington  Procedure(s) Performed: ESOPHAGOGASTRODUODENOSCOPY (EGD)  Patient location during evaluation: Endoscopy Anesthesia Type: General Level of consciousness: awake and alert Pain management: pain level controlled Vital Signs Assessment: post-procedure vital signs reviewed and stable Respiratory status: spontaneous breathing, nonlabored ventilation, respiratory function stable and patient connected to nasal cannula oxygen Cardiovascular status: blood pressure returned to baseline and stable Postop Assessment: no apparent nausea or vomiting Anesthetic complications: no   No notable events documented.   Last Vitals:  Vitals:   09/04/20 0857 09/04/20 0900  BP: (!) 143/86 (!) 143/86  Pulse: 75 90  Resp: 18 (!) 27  Temp: 36.4 C   SpO2: 96% 97%    Last Pain:  Vitals:   09/04/20 0857  TempSrc:   PainSc: 0-No pain                 Arita Miss

## 2020-09-04 NOTE — Transfer of Care (Signed)
Immediate Anesthesia Transfer of Care Note  Patient: Ana Washington  Procedure(s) Performed: ESOPHAGOGASTRODUODENOSCOPY (EGD)  Patient Location: PACU  Anesthesia Type:General  Level of Consciousness: awake  Airway & Oxygen Therapy: Patient Spontanous Breathing and Patient connected to face mask oxygen  Post-op Assessment: Report given to RN  Post vital signs: stable  Last Vitals:  Vitals Value Taken Time  BP 132/88 09/04/20 0840  Temp    Pulse 31 09/04/20 0841  Resp 29 09/04/20 0841  SpO2 97 % 09/04/20 0841  Vitals shown include unvalidated device data.  Last Pain:  Vitals:   09/04/20 0657  TempSrc: Temporal  PainSc: 0-No pain         Complications: No notable events documented.

## 2020-09-04 NOTE — Op Note (Addendum)
[facility] Gastroenterology Patient Name: Ana Washington Procedure Date: 09/04/2020 6:56 AM MRN: [id number] Account #: [id number] Date of Birth: Nov 22, 1945 Admit Type: Inpatient Age: 75 Room: [facility] ENDO ROOM 2 Gender: Female Note Status: Finalized Procedure:             Upper GI endoscopy Indications:           Foreign body in the esophagus Providers:             Lorana Maffeo B. Bonna Gains MD, MD Referring MD:          Irven Easterly. Kary Kos, MD (Referring MD) Medicines:             Monitored Anesthesia Care Complications:         No immediate complications. Procedure:             Pre-Anesthesia Assessment:                        - The risks and benefits of the procedure and the                         sedation options and risks were discussed with the                         patient. All questions were answered and informed                         consent was obtained.                        - Patient identification and proposed procedure were                         verified prior to the procedure.                        - ASA Grade Assessment: III - A patient with severe                         systemic disease.                        After obtaining informed consent, the endoscope was                         passed under direct vision. Throughout the procedure,                         the patient's blood pressure, pulse, and oxygen                         saturations were monitored continuously. The Endoscope                         was introduced through the mouth, and advanced to the                         second part of duodenum. The upper GI endoscopy was  accomplished with ease. The patient tolerated the                         procedure well. Findings:      Food was found in the proximal esophagus, in the mid esophagus and in       the distal esophagus. Removal of food was accomplished. Jabier Mutton net was       used to remove large pieces of food  from the esophagus.      The distal esophagus was significantly tortuous.      There is no endoscopic evidence of stenosis or stricture in the entire       esophagus.      A large hiatal hernia was present.      A large amount of food (residue) was found in the entire examined       stomach.      A non-bleeding diverticulum was found in the duodenal bulb and in the       second portion of the duodenum. 3 large diverticuli seen.      The exam of the duodenum was otherwise normal.      The presence of large amounts of food precluded complete visualization       of parts of the stomach. Impression:            - Food in the proximal esophagus, in the mid esophagus                         and in the distal esophagus. Removal was successful.                        - Tortuous esophagus.                        - Large hiatal hernia.                        - A large amount of food (residue) in the stomach.                        - Non-bleeding duodenal diverticulum.                        - Patient's anatomy with a large hiatal hernia,                         leading to tortuous distal esophagus is the likely                         cause of patient's food impaction and dysphagia. Recommendation:        - Follow up in GI clinic with Dr. Vicente Males. Please call                         [phone] to make an appointment                        Follow up in Surgery clinic with Dr. Dahlia Byes for follow                         up of hiatal hernia                        -  Clear liquid diet today, then advance as tolerated                         to full liquid diet.                        - Continue present medications.                        - Follow an antireflux regimen.                        - Return to primary care physician in 4 weeks.                        - The findings and recommendations were discussed with                         the patient.                        - The findings and  recommendations were discussed with                         the patient's family (sister). Pt and family states                         they will call the above number to make an appointment                         in clinic. Procedure Code(s):     --- Professional ---                        [phone], Esophagogastroduodenoscopy, flexible,                         transoral; with removal of foreign body(s) Diagnosis Code(s):     --- Professional ---                        [id number], Food in esophagus causing other injury,                         initial encounter                        Q39.9, Congenital malformation of esophagus,                         unspecified                        K44.9, Diaphragmatic hernia without obstruction or                         gangrene                        T18.2XXA, Foreign body in stomach, initial encounter                        T18.108A, Unspecified foreign body in esophagus  causing other injury, initial encounter                        K57.10, Diverticulosis of small intestine without                         perforation or abscess without bleeding CPT copyright 2019 American Medical Association. All rights reserved. The codes documented in this report are preliminary and upon coder review may  be revised to meet current compliance requirements.  [name], MD Margretta Sidle B. Bonna Gains MD, MD 09/04/2020 8:56:27 AM This report has been signed electronically. Number of Addenda: 0 Note Initiated On: 09/04/2020 6:56 AM Estimated Blood Loss:  Estimated blood loss: none.      [facility]

## 2020-09-04 NOTE — Anesthesia Preprocedure Evaluation (Addendum)
Anesthesia Evaluation  Patient identified by MRN, date of birth, ID band Patient awake    Reviewed: Allergy & Precautions, H&P , NPO status , Patient's Chart, lab work & pertinent test results  History of Anesthesia Complications (+) [name], Family history of anesthesia reaction and history of anesthetic complications  Airway Mallampati: II  TM Distance: <3 FB Neck ROM: limited    Dental  (+) Chipped, Upper Dentures, Poor Dentition, Missing   Pulmonary shortness of breath, with exertion and Long-Term Oxygen Therapy, COPD,  COPD inhaler and oxygen dependent, neg recent URI,  Chronic bronchiectasis, on 2L/min O2 via [place], 24/7    + decreased breath sounds      Cardiovascular Exercise Tolerance: Poor hypertension, (-) angina+ CAD, + Past MI and +CHF  (-) Cardiac Stents and (-) CABG + dysrhythmias Atrial Fibrillation  Rhythm:Irregular Rate:Normal  TTE 2022:  1. Left ventricular ejection fraction, by estimation, is 50 to 55%. The  left ventricle has low normal function. The left ventricle has no regional  wall motion abnormalities. Left ventricular diastolic parameters are  consistent with Grade I diastolic  dysfunction (impaired relaxation).  2. Right ventricular systolic function is normal. The right ventricular  size is normal.  3. The mitral valve is normal in structure. Mild mitral valve  regurgitation. No evidence of mitral stenosis.  4. The aortic valve is normal in structure. Aortic valve regurgitation is  mild. No aortic stenosis is present.  5. The inferior vena cava is normal in size with greater than 50%  respiratory variability, suggesting right atrial pressure of 3 mmHg.    Neuro/Psych  Headaches, neg Seizures PSYCHIATRIC DISORDERS Anxiety  Neuromuscular disease    GI/Hepatic Neg liver ROS, PUD, GERD  Medicated and Controlled,  Endo/Other  negative endocrine ROS  Renal/GU      Musculoskeletal  (+) Arthritis  ,   Abdominal   Peds  Hematology negative hematology ROS (+)   Anesthesia Other Findings Past Medical History: No date: Arthritis No date: Atrial fibrillation (HCC) No date: Bronchiectasis (HCC) No date: CAD (coronary artery disease) 07/30/2017: CAD (coronary artery disease) No date: Dumping syndrome No date: Dyspnea No date: Dysrhythmia     Comment:  afib 10/03/2013: Essential hypertension, malignant No date: Family history of adverse reaction to anesthesia     Comment:  sister [name] No date: GERD (gastroesophageal reflux disease) No date: Headache     Comment:  MIGRAINES No date: Hypertension No date: Lung disease 2007: Myocardial infarction ([place])     Comment:  Non-STEMI No date: [name] (postoperative nausea and vomiting) No date: Psoriasis No date: PUD (peptic ulcer disease)     Reproductive/Obstetrics negative OB ROS                            Anesthesia Physical  Anesthesia Plan  ASA: 4 and emergent  Anesthesia Plan: General   Post-op Pain Management:    Induction: Intravenous and Rapid sequence  [name] Risk Score and Plan: 4 or greater and Ondansetron, Dexamethasone, TIVA and Propofol infusion  Airway Management Planned: Oral ETT and Video Laryngoscope Planned  Additional Equipment: None  Intra-op Plan:   Post-operative Plan: Extubation in OR and Possible Post-op intubation/ventilation  Informed Consent: I have reviewed the patients History and Physical, chart, labs and discussed the procedure including the risks, benefits and alternatives for the proposed anesthesia with the patient or authorized representative who has indicated his/her understanding and acceptance.     Dental advisory  given  Plan Discussed with: CRNA and Surgeon  Anesthesia Plan Comments: (Extensively discussed that the only way to safely do this procedure is securing her airway with GETA. Patient is naturally very scared because of her poor pulmonary status. I  said that the risk of mild sedation and subsequent aspiration is much higher and graver than the risk of intubation. Discussed risks of anesthesia with patient, including [name], sore throat, lip/dental damage. Rare risks discussed as well, such as cardiorespiratory and neurological sequelae, and allergic reactions. Discussed possible need for prolonged intubation. Patient understands.)        Anesthesia Quick Evaluation

## 2020-09-04 NOTE — Anesthesia Procedure Notes (Addendum)
Procedure Name: Intubation Date/Time: 09/04/2020 7:15 AM Performed by: Lerry Liner, CRNA Pre-anesthesia Checklist: Patient identified, Emergency Drugs available, Suction available and Patient being monitored Patient Re-evaluated:Patient Re-evaluated prior to induction Oxygen Delivery Method: Circle system utilized Preoxygenation: Pre-oxygenation with 100% oxygen Induction Type: IV induction and Rapid sequence Laryngoscope Size: McGraph and 3 Grade View: Grade I Tube type: Oral Number of attempts: 1 Airway Equipment and Method: Stylet and Oral airway Placement Confirmation: ETT inserted through vocal cords under direct vision, positive ETCO2 and breath sounds checked- equal and bilateral Secured at: 22 cm Tube secured with: Tape Dental Injury: Teeth and Oropharynx as per pre-operative assessment  Comments: Unremarkable RSI. MV not attempted

## 2020-09-04 NOTE — ED Provider Notes (Signed)
[facility] Emergency Department Provider Note  ____________________________________________   Event Date/Time   First MD Initiated Contact with Patient 09/04/20 0117     (approximate)  I have reviewed the triage vital signs and the nursing notes.   HISTORY  Chief Complaint Foreign Body    HPI PARLIE Ana Washington is a 75 y.o. female  with h/o CAD, HTN, CHF, ho AFib/SVT here with palpitations. Pt reports she was in her usual state of health until this evening, when she began to experience palpitations, SOB. Felt like she could not catch he breath and felt like her heart was beating very quickly. She's had persistent sx since then. Denies any overt CP. No SOB. No recent med changes. Denies any recent illnesses. She's had some slight increase in LE swelling recently and they have been trying to pull more fluid off at dialysis. EMS attempted vagal maneuvers in route for tachycardia w/o significant relief.        Past Medical History:  Diagnosis Date   Arthritis    Atrial fibrillation ([place])    Bronchiectasis ([place])    CAD (coronary artery disease) 07/30/2017   Dumping syndrome    Essential hypertension, malignant 10/03/2013   Family history of adverse reaction to anesthesia    sister [name]   GERD (gastroesophageal reflux disease)    Headache    MIGRAINES   Myocardial infarction ([place]) 2007   Non-STEMI   [name] (postoperative nausea and vomiting)    Psoriasis    PUD (peptic ulcer disease)     Patient Active Problem List   Diagnosis Date Noted   CHF (congestive heart failure) ([place]) 08/24/2020   Chronic hyponatremia 08/23/2020   Acute on chronic diastolic CHF (congestive heart failure) ([name]) 08/23/2020   Morphine Overdose, accidental or unintentional, initial encounter 08/23/2020   Bronchiectasis with acute exacerbation ([place]) 07/31/2020   CAP (community acquired pneumonia) 07/30/2020   Acute exacerbation of bronchiectasis ([place]) 07/14/2020   COVID-19 virus  infection 07/14/2020   Lactic acidosis 07/14/2020   SIRS (systemic inflammatory response syndrome) ([place]) 07/14/2020   Elevated troponin level not due myocardial infarction 07/14/2020   Anxiety    Constipation    Pressure injury of right heel, stage 1    Bronchiectasis with (acute) exacerbation ([place]) 04/29/2020   Hemoptysis    Chronic respiratory failure with hypoxia ([place])    AKI (acute kidney injury) ([place])    Bronchiectasis ([place]) 09/08/2019   Atrial fibrillation, chronic ([place]) 09/08/2019   Closed right hip fracture ([place]) 09/27/2018   Intractable nausea and vomiting 04/08/2018   Atrial fibrillation with RVR ([place]) 04/08/2018   Thoracic radiculitis (Bilateral) 03/29/2018   Vasovagal episode 03/29/2018   Closed compression fracture of T10 thoracic vertebra, sequela 03/29/2018   Abnormal MRI, lumbar spine (12/15/2016) 03/21/2018   Lumbar compression fractures, sequela (L1, L2, L3, L4, and L5) 03/21/2018   Closed compression fracture of L1 lumbar vertebra, sequela 03/21/2018   Closed compression fracture of L2 lumbar vertebra, sequela 03/21/2018   Closed compression fracture of L3 lumbar vertebra, sequela 03/21/2018   Closed compression fracture of L4 lumbar vertebra, sequela 03/21/2018   Closed compression fracture of L5 lumbar vertebra, sequela 03/21/2018   Thoracic compression fracture, sequela (T5, T9, T10, T11, and T12) 03/21/2018   Closed compression fracture of T5 thoracic vertebra, sequela 03/21/2018   Closed compression fracture of T9 thoracic vertebra, sequela 03/21/2018   Close compression fracture of T11 thoracic vertebra, sequela 03/21/2018   Closed compression fracture of T12 thoracic vertebra,  sequela 03/21/2018   Lumbar facet hypertrophy 03/21/2018   Grade 1  Lumbar Anterolisthesis of L3/4 and L4/5 03/21/2018   Lumbar central spinal stenosis (Multilevel), 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 claudication 03/21/2018   Chronic anticoagulation (ELIQUIS) 03/21/2018   History of pelvic fracture  03/21/2018   Chronic musculoskeletal pain 03/21/2018   Neurogenic pain 03/21/2018   Long term prescription benzodiazepine use 03/21/2018   DDD (degenerative disc disease), 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   Adult bronchiectasis ([place]) 03/01/2018   Diverticulitis 03/01/2018   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[place]) 03/01/2018   Migraines 03/01/2018   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   Osteoporosis, 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   Psoriasis 03/01/2018   Chronic upper back pain (Primary Area of Pain) (Bilateral) (R>L) 03/01/2018   Chronic low back pain (Secondary Area of Pain) (Bilateral) (R>L) w/o sciatica 03/01/2018   Chronic pain syndrome 03/01/2018   Long term current use of opiate analgesic 03/01/2018   Pharmacologic therapy 03/01/2018   Disorder of skeletal system 03/01/2018   Problems influencing health status 03/01/2018   History of kyphoplasty (L1, L2, L3, T9, T11, and T12) 01/05/2018   Malnutrition of moderate degree ([place]) 08/03/2017   GERD (gastroesophageal reflux disease) 07/30/2017   Pelvic fracture ([place]) 07/30/2017   AF (paroxysmal atrial fibrillation) ([place]) 07/30/2017   Other dysphagia 09/13/2016   Unintended weight loss 09/13/2016   Essential hypertension 10/03/2013   Osteoarthritis of knees (Bilateral) 10/03/2013   Primary localized osteoarthrosis, lower leg 10/03/2013   Non-ischemic cardiomyopathy ([place]) 09/12/2013   Coronary artery disease involving native coronary artery of native heart without angina pectoris 07/28/2012   Hyperlipidemia, mixed 07/28/2012   DDD (degenerative disc disease), lumbar 02/29/2012    Past Surgical History:  Procedure Laterality Date   BACK SURGERY     CHOLECYSTECTOMY     ESOPHAGOGASTRODUODENOSCOPY (EGD) WITH PROPOFOL N/A 03/19/2019   Procedure: ESOPHAGOGASTRODUODENOSCOPY (EGD) WITH PROPOFOL;  Surgeon: [name], MD;  Location[facility] ENDOSCOPY;  Service: Gastroenterology;  Laterality: N/A;  *Note to anesthesia: Per pt's pulmonologist, if  intubating, please extubate to BIPAP.   EYE SURGERY     FOOT SURGERY     INTRAMEDULLARY (IM) NAIL INTERTROCHANTERIC Right 09/30/2018   Procedure: INTRAMEDULLARY (IM) NAIL INTERTROCHANTRIC;  Surgeon: Dereck Leep, MD;  Location: [facility] ORS;  Service: Orthopedics;  Laterality: Right;   KYPHOPLASTY N/A 07/05/2016   Procedure: KYPHOPLASTY T - 9;  Surgeon: Hessie Knows, MD;  Location: [facility] ORS;  Service: Orthopedics;  Laterality: N/A;   KYPHOPLASTY N/A 11/29/2017   Procedure: Iona Hansen;  Surgeon: Hessie Knows, MD;  Location: [facility] ORS;  Service: Orthopedics;  Laterality: N/A;  L2 and L3   KYPHOPLASTY N/A 12/18/2017   Procedure: KYPHOPLASTY L1;  Surgeon: Hessie Knows, MD;  Location: [facility] ORS;  Service: Orthopedics;  Laterality: N/A;   KYPHOPLASTY N/A 01/05/2018   Procedure: KYPHOPLASTY-T11,T12;  Surgeon: Hessie Knows, MD;  Location: [facility] ORS;  Service: Orthopedics;  Laterality: N/A;   KYPHOPLASTY N/A 04/05/2018   Procedure: T10 KYPHOPLASTY;  Surgeon: Hessie Knows, MD;  Location: [facility] ORS;  Service: Orthopedics;  Laterality: N/A;   KYPHOPLASTY N/A 04/12/2018   Procedure: KYPHOPLASTY T7,8;  Surgeon: Hessie Knows, MD;  Location: [facility] ORS;  Service: Orthopedics;  Laterality: N/A;   KYPHOPLASTY N/A 04/19/2018   Procedure: KYPHOPLASTY T5, T6;  Surgeon: Hessie Knows, MD;  Location: [facility] ORS;  Service: Orthopedics;  Laterality: N/A;   LUNG SURGERY  1990 and 1996   THOROCOTOMY WITH LOBECTOMY     LEFT LOWER THORACOTOMY / RIGHT MIDDLE LOBECTOMY    Prior to Admission medications  Medication Sig Start Date End Date Taking? Authorizing Provider  apixaban (ELIQUIS) 2.5 MG TABS tablet Take 1 tablet (2.5 mg total) by mouth 2 (two) times daily. 08/27/20 09/26/20 Yes Sreenath, Sudheer B, MD  acetaminophen (TYLENOL) 500 MG tablet Take 1,000 mg by mouth every 6 (six) hours as needed for mild pain or headache.     [provider]  acetylcysteine (MUCOMYST) 20 % nebulizer solution Take 4 mLs by  nebulization daily. 04/22/20   [provider]  amiodarone (PACERONE) 200 MG tablet Take 1 tablet (200 mg total) by mouth 2 (two) times daily. 08/07/20 09/06/20  Val Riles, MD  Ascorbic Acid (VITAMIN C) 1000 MG tablet Take 1,000 mg by mouth daily.    [provider]  benzonatate (TESSALON) 200 MG capsule Take 200 mg by mouth 3 (three) times daily as needed. 04/02/20   [provider]  COMBIVENT RESPIMAT 20-100 MCG/ACT AERS respimat 1 puff every 6 (six) hours as needed for wheezing. 06/09/20   [provider]  digoxin (LANOXIN) 0.125 MG tablet Take 125 mcg by mouth daily. 05/11/20   [provider]  ferrous sulfate 325 (65 FE) MG EC tablet Take 325 mg by mouth daily.     [provider]  furosemide (LASIX) 20 MG tablet Take 1 tablet (20 mg total) by mouth daily. 08/27/20   Sidney Ace, MD  gabapentin (NEURONTIN) 300 MG capsule Take 300 mg by mouth at bedtime as needed.    [provider]  glycopyrrolate (ROBINUL) 1 MG tablet Take 1 mg by mouth 3 (three) times daily.    [provider]  Infant [employer] [facility]) OINT Apply 1 application topically as needed. 08/17/20   [provider]  levalbuterol (XOPENEX) 0.31 MG/3ML nebulizer solution Inhale 3 mLs into the lungs every 6 (six) hours as needed. 12/10/19   [provider]  LORazepam (ATIVAN) 0.5 MG tablet Take 1 tablet (0.5 mg total) by mouth at bedtime. 09/10/19   Enzo Bi, MD  metoprolol tartrate (LOPRESSOR) 25 MG tablet Take 1 tablet (25 mg total) by mouth 2 (two) times daily. 08/07/20 09/06/20  Val Riles, MD  NAC 600 MG CAPS Take 600 mg by mouth daily after breakfast.  08/17/19   [provider]  omeprazole (PRILOSEC) 20 MG capsule Take 20 mg by mouth daily. 08/26/19   [provider]  polyethylene glycol (MIRALAX / GLYCOLAX) 17 g packet Take 17 g by mouth daily as needed for severe constipation. 05/02/20   Loletha Grayer, MD   potassium chloride (KLOR-CON) 10 MEQ tablet Take 10 mEq by mouth daily. 08/16/20   [provider]  predniSONE (DELTASONE) 10 MG tablet Take 10 mg by mouth daily. 08/07/20   [provider]  Probiotic Product (ALIGN) 4 MG CAPS Take 4 mg by mouth daily.    [provider]  senna (SENOKOT) 8.6 MG TABS tablet Take 2 tablets (17.2 mg total) by mouth at bedtime. Patient taking differently: Take 1-2 tablets by mouth daily as needed. 05/02/20   Loletha Grayer, MD  Sodium Chloride, Inhalant, 7 % NEBU Inhale 4 mLs into the lungs daily.    [provider]    Allergies Codeine, Sulfa antibiotics, and Penicillins  Family History  Problem Relation Age of Onset   Hypertension Mother    Hypertension Father     Social History Social History   Tobacco Use   Smoking status: Never   Smokeless tobacco: Never  Vaping Use   Vaping Use: Never  used  Substance Use Topics   Alcohol use: No   Drug use: No    Review of Systems  Review of Systems  Constitutional:  Positive for fatigue. Negative for fever.  HENT:  Negative for congestion and sore throat.   Eyes:  Negative for visual disturbance.  Respiratory:  Positive for shortness of breath. Negative for cough.   Cardiovascular:  Positive for palpitations. Negative for chest pain.  Gastrointestinal:  Negative for abdominal pain, diarrhea, nausea and vomiting.  Genitourinary:  Negative for flank pain.  Musculoskeletal:  Negative for back pain and neck pain.  Skin:  Negative for rash and wound.  Neurological:  Positive for weakness.    ____________________________________________  PHYSICAL EXAM:      VITAL SIGNS: ED Triage Vitals  Enc Vitals Group     BP 09/03/20 2022 (!) 190/100     Pulse Rate 09/03/20 2022 70     Resp 09/03/20 2022 20     Temp 09/03/20 2022 98.5 F (36.9 C)     Temp Source 09/03/20 2022 Oral     SpO2 09/03/20 2022 97 %     Weight 09/03/20 2023 99 lb (44.9 kg)     Height 09/03/20 2023  '5\' 5"'$  (1.651 m)     Head Circumference --      Peak Flow --      Pain Score 09/03/20 2023 5     Pain Loc --      Pain Edu? --      Excl. in [place]? --      Physical Exam Vitals and nursing note reviewed.  Constitutional:      General: She is not in acute distress.    Appearance: She is well-developed.  HENT:     Head: Normocephalic and atraumatic.  Eyes:     Conjunctiva/sclera: Conjunctivae normal.  Cardiovascular:     Rate and Rhythm: Normal rate and regular rhythm.     Heart sounds: Normal heart sounds. No murmur heard.   No friction rub.  Pulmonary:     Effort: Pulmonary effort is normal. No respiratory distress.     Breath sounds: Normal breath sounds. No wheezing or rales.  Abdominal:     General: There is no distension.     Palpations: Abdomen is soft.     Tenderness: There is no abdominal tenderness.  Musculoskeletal:     Cervical back: Neck supple.  Skin:    General: Skin is warm.     Capillary Refill: Capillary refill takes less than 2 seconds.  Neurological:     Mental Status: She is alert and oriented to person, place, and time.     Motor: No abnormal muscle tone.      ____________________________________________   LABS (all labs ordered are listed, but only abnormal results are displayed)  Labs Reviewed  BASIC METABOLIC PANEL - Abnormal; Notable for the following components:      Result Value   Sodium 127 (*)    Chloride 81 (*)    CO2 34 (*)    All other components within normal limits  CBC - Abnormal; Notable for the following components:   WBC 11.3 (*)    RBC 3.47 (*)    Hemoglobin 11.9 (*)    HCT 33.9 (*)    MCH 34.3 (*)    All other components within normal limits  TROPONIN I (HIGH SENSITIVITY) - Abnormal; Notable for the following components:   Troponin I (High Sensitivity) 37 (*)    All other  components within normal limits  TROPONIN I (HIGH SENSITIVITY) - Abnormal; Notable for the following components:   Troponin I (High Sensitivity) 50  (*)    All other components within normal limits  DIGOXIN LEVEL    ____________________________________________  EKG: Normal sinus rhythm, ventricular rate 68.  PR 26, cures 96, QTc 423.  No acute ST elevations or depressions.  No acute evidence of acute ischemia or infarct. ________________________________________  RADIOLOGY All imaging, including plain films, CT scans, and ultrasounds, independently reviewed by me, and interpretations confirmed via formal radiology reads.  ED MD interpretation:   Chest x-ray: Persistent but increased left basilar consolidation, in comparison to prior exam, likely atelectasis and hiatal hernia, less likely superimposed infection  Official radiology report(s): DG Chest 2 View  Result Date: 09/03/2020 CLINICAL DATA:  Choking EXAM: CHEST - 2 VIEW COMPARISON:  Radiograph 08/23/2020 FINDINGS: Unchanged cardiomediastinal silhouette. Bilateral postoperative changes. There are chronic parenchymal changes. Nodular opacity in the right lower lung, similar to prior exam. Hiatal hernia. Persistent but increased left basilar consolidation. Small bilateral pleural effusions. No visible pneumothorax. Chronic bilateral rib injuries. Unchanged numerous vertebroplasties. IMPRESSION: Persistent but increased left basilar consolidation in comparison to prior exam, could represent atelectasis and known hiatal hernia. Superimposed infection is possible. Small bilateral pleural effusions, unchanged from prior exam. Stable postoperative changes and chronic lung disease bilaterally. Unchanged chronic nodular opacities bilaterally. Electronically Signed   By: Maurine Simmering   On: 09/03/2020 21:04    ____________________________________________  PROCEDURES   Procedure(s) performed (including Critical Care):  Procedures  ____________________________________________  INITIAL IMPRESSION / MDM / [place] / ED COURSE  As part of my medical decision making, I reviewed the  following data within the [place] notes reviewed and incorporated, Old chart reviewed, Notes from prior ED visits, and [place] Controlled Substance Database       *JAYLANIE LAIRD was evaluated in Emergency Department on 09/04/2020 for the symptoms described in the history of present illness. She was evaluated in the context of the global COVID-19 pandemic, which necessitated consideration that the patient might be at risk for infection with the SARS-CoV-2 virus that causes COVID-19. Institutional protocols and algorithms that pertain to the evaluation of patients at risk for COVID-19 are in a state of rapid change based on information released by regulatory bodies including the CDC and federal and state organizations. These policies and algorithms were followed during the patient's care in the ED.  Some ED evaluations and interventions may be delayed as a result of limited staffing during the pandemic.*     Medical Decision Making: 75 year old female with extensive past medical history as above here with food impaction.  Patient was eating chicken when she felt it stuck and has been unable to tolerate p.o. intake since then.  On exam, she appears overall in no acute distress.  She does appear to be tolerating her secretions.  Chest x-ray reviewed, she has some chronic left basilar consolidation which I suspect is due to her hiatal hernia.  She is adamant she has had no increased cough, sputum production, shortness of breath, or choking with the current episode.  Lab work is at baseline with moderate baseline troponin anemia that does not appear above her usual.  CBC and BMP are at her baseline.  While she does have significant underlying comorbidities, she is adamant from this perspective she felt fine prior to the choking episode.  I discussed the case with GI, Dr. Bonna Gains.  She  will go to endoscopy in the morning so that full team is available given her comorbidities.  Suspect she  can be discharged thereafter as long as she medically tolerates the procedure.  ____________________________________________  FINAL CLINICAL IMPRESSION(S) / ED DIAGNOSES  Final diagnoses:  Food impaction of esophagus, initial encounter     MEDICATIONS GIVEN DURING THIS VISIT:  Medications  glucagon (human recombinant) (GLUCAGEN) injection 1 mg (1 mg Intravenous Given 09/04/20 0216)  ondansetron (ZOFRAN) injection 4 mg (4 mg Intravenous Given 09/04/20 0211)  LORazepam (ATIVAN) injection 0.5 mg (0.5 mg Intravenous Given 09/04/20 0213)  0.9 %  sodium chloride infusion ( Intravenous New Bag/Given 09/04/20 0511)     ED Discharge Orders     None        Note:  This document was prepared using Dragon voice recognition software and may include unintentional dictation errors.   Duffy Bruce, MD 09/04/20 0700

## 2020-09-04 NOTE — ED Notes (Signed)
Pt given water to swallow per order from Dr. Ellender Hose. She states "it wont go down" and still reports pain with swallowing. Dr. Ellender Hose informed via epic secure chat.

## 2020-09-04 NOTE — Progress Notes (Addendum)
ER notified me about symptoms of food impaction in this patient around 5 am today. Pt ate chicken last night and has had no relief in symptoms despite glucagon and ativan given in the ER.  Pt is on chronic oxygen at home. Previous GI notes by Dr. Vicente Males reveal that she has a large hiatal hernia with most or all of her stomach in her thorax and and was considered a high risk patient for sedation due to this  Hiatal hernia repair has not been done yet due to her comorbidities and high risk status. Dr. Dahlia Byes had referred her to tertiary center for evaluation of hiatal hernia, but I do not see any notes from tertiary center in this regard.   Had recent pneumonia in July as well and was hospitalized.  Dr Ellender Hose states pt appears well and does not appear toxic. Given above history (and on eliquis as well), will plan on proceeding with the case around 730 am, which would allow for presence of usual anesthesia day staff around in this otherwise high risk patient.   This would be within 24 hrs of ingestion.  However, if pt starts to appear worse before then ER can notify me.  Full consult note to follow after I evaluate the patient.   Scheduling staff in [place] notified and case has been posted

## 2020-09-04 NOTE — Consult Note (Addendum)
[name], MD [address], [place], [place], [place], [address] [address], [place], [place], [place], [address] Phone: [phone]  Fax: [phone]  Consultation  Referring Provider:     Dr. Duffy Bruce Primary Care Physician:  Ana Pink, MD Reason for Consultation:     Food Impaction  Date of Admission:  09/04/2020 Date of Consultation:  09/04/2020         HPI:   Ana Washington is a 75 y.o. female who presents with inability to swallow liquids or solids since dinner last night when she was having chicken quesadilla.  Patient was given Ativan and glucagon in the ER with no relief in symptoms.  Tolerating oral secretions.  No shortness of breath above baseline.  Reports intermittent dysphagia in the past but has never had a food impaction.  Patient was previously evaluated by GI, Dr. Vicente Washington for preoperative evaluation prior to hiatal hernia repair.  EGD was done in February 2021 that showed a large hiatal hernia.  Patient was evaluated Dr. Dahlia Washington of general surgery for hiatal hernia repair.  Notes personally reviewed and tertiary care referral was recommended due to significant comorbidities and this causing the patient to be high risk.  It appears, patient has not been seen at tertiary care center for evaluation of hiatal hernia repair  Patient has had multiple recent admissions to [facility] discharge summaries, from those admissions from July 28 and July 8 reviewed.  Most recently due to unintentional overdose of morphine, requiring Narcan reversal.  she was admitted with a pneumonia as well and discharged on July 8  She is on chronic oxygen at home  She is also on Eliquis for A. fib that she has been taking as prescribed    Past Medical History:  Diagnosis Date   Arthritis    Atrial fibrillation ([place])    Bronchiectasis ([place])    CAD (coronary artery disease) 07/30/2017   Dumping syndrome    Essential hypertension, malignant 10/03/2013   Family history of adverse  reaction to anesthesia    sister [name]   GERD (gastroesophageal reflux disease)    Headache    MIGRAINES   Myocardial infarction ([place]) 2007   Non-STEMI   [name] (postoperative nausea and vomiting)    Psoriasis    PUD (peptic ulcer disease)     Past Surgical History:  Procedure Laterality Date   BACK SURGERY     CHOLECYSTECTOMY     ESOPHAGOGASTRODUODENOSCOPY (EGD) WITH PROPOFOL N/A 03/19/2019   Procedure: ESOPHAGOGASTRODUODENOSCOPY (EGD) WITH PROPOFOL;  Surgeon: Ana Bellows, MD;  Location: [facility] ENDOSCOPY;  Service: Gastroenterology;  Laterality: N/A;  *Note to anesthesia: Per pt's pulmonologist, if intubating, please extubate to BIPAP.   EYE SURGERY     FOOT SURGERY     INTRAMEDULLARY (IM) NAIL INTERTROCHANTERIC Right 09/30/2018   Procedure: INTRAMEDULLARY (IM) NAIL INTERTROCHANTRIC;  Surgeon: Ana Leep, MD;  Location: [facility] ORS;  Service: Orthopedics;  Laterality: Right;   KYPHOPLASTY N/A 07/05/2016   Procedure: KYPHOPLASTY T - 9;  Surgeon: Ana Knows, MD;  Location: [facility] ORS;  Service: Orthopedics;  Laterality: N/A;   KYPHOPLASTY N/A 11/29/2017   Procedure: Ana Washington;  Surgeon: Ana Knows, MD;  Location: [facility] ORS;  Service: Orthopedics;  Laterality: N/A;  L2 and L3   KYPHOPLASTY N/A 12/18/2017   Procedure: KYPHOPLASTY L1;  Surgeon: Ana Knows, MD;  Location: [facility] ORS;  Service: Orthopedics;  Laterality: N/A;   KYPHOPLASTY N/A 01/05/2018   Procedure: KYPHOPLASTY-T11,T12;  Surgeon: Ana Knows, MD;  Location: [facility] ORS;  Service: Orthopedics;  Laterality: N/A;   KYPHOPLASTY N/A 04/05/2018   Procedure: T10 KYPHOPLASTY;  Surgeon: Ana Knows, MD;  Location: [facility] ORS;  Service: Orthopedics;  Laterality: N/A;   KYPHOPLASTY N/A 04/12/2018   Procedure: KYPHOPLASTY T7,8;  Surgeon: Ana Knows, MD;  Location: [facility] ORS;  Service: Orthopedics;  Laterality: N/A;   KYPHOPLASTY N/A 04/19/2018   Procedure: KYPHOPLASTY T5, T6;  Surgeon: Ana Knows, MD;  Location: [facility] ORS;   Service: Orthopedics;  Laterality: N/A;   LUNG SURGERY  1990 and 1996   THOROCOTOMY WITH LOBECTOMY     LEFT LOWER THORACOTOMY / RIGHT MIDDLE LOBECTOMY    Prior to Admission medications   Medication Sig Start Date End Date Taking? Authorizing Provider  apixaban (ELIQUIS) 2.5 MG TABS tablet Take 1 tablet (2.5 mg total) by mouth 2 (two) times daily. 08/27/20 09/26/20 Yes Sreenath, Sudheer B, MD  acetaminophen (TYLENOL) 500 MG tablet Take 1,000 mg by mouth every 6 (six) hours as needed for mild pain or headache.     [provider]  acetylcysteine (MUCOMYST) 20 % nebulizer solution Take 4 mLs by nebulization daily. 04/22/20   [provider]  amiodarone (PACERONE) 200 MG tablet Take 1 tablet (200 mg total) by mouth 2 (two) times daily. 08/07/20 09/06/20  Ana Riles, MD  Ascorbic Acid (VITAMIN C) 1000 MG tablet Take 1,000 mg by mouth daily.    [provider]  benzonatate (TESSALON) 200 MG capsule Take 200 mg by mouth 3 (three) times daily as needed. 04/02/20   [provider]  COMBIVENT RESPIMAT 20-100 MCG/ACT AERS respimat 1 puff every 6 (six) hours as needed for wheezing. 06/09/20   [provider]  digoxin (LANOXIN) 0.125 MG tablet Take 125 mcg by mouth daily. 05/11/20   [provider]  ferrous sulfate 325 (65 FE) MG EC tablet Take 325 mg by mouth daily.     [provider]  furosemide (LASIX) 20 MG tablet Take 1 tablet (20 mg total) by mouth daily. 08/27/20   Ana Ace, MD  gabapentin (NEURONTIN) 300 MG capsule Take 300 mg by mouth at bedtime as needed.    [provider]  glycopyrrolate (ROBINUL) 1 MG tablet Take 1 mg by mouth 3 (three) times daily.    [provider]  Infant Care Products [facility]) OINT Apply 1 application topically as needed. 08/17/20   [provider]  levalbuterol (XOPENEX) 0.31 MG/3ML nebulizer solution Inhale 3 mLs into the lungs every 6 (six) hours as needed. 12/10/19   [provider]  LORazepam (ATIVAN) 0.5 MG tablet Take 1 tablet (0.5 mg total) by mouth at bedtime. 09/10/19   Ana Bi, MD  metoprolol tartrate (LOPRESSOR) 25 MG tablet Take 1 tablet (25 mg total) by mouth 2 (two) times daily. 08/07/20 09/06/20  Ana Riles, MD  NAC 600 MG CAPS Take 600 mg by mouth daily after breakfast.  08/17/19   [provider]  omeprazole (PRILOSEC) 20 MG capsule Take 20 mg by mouth daily. 08/26/19   [provider]  polyethylene glycol (MIRALAX / GLYCOLAX) 17 g packet Take 17 g by mouth daily as needed for severe constipation. 05/02/20   Loletha Grayer, MD  potassium chloride (KLOR-CON) 10 MEQ tablet Take 10 mEq by mouth daily. 08/16/20   [provider]  predniSONE (DELTASONE) 10 MG tablet Take 10 mg by mouth daily. 08/07/20   [provider]  Probiotic Product (ALIGN) 4 MG CAPS Take 4 mg by mouth  daily.    [provider]  senna (SENOKOT) 8.6 MG TABS tablet Take 2 tablets (17.2 mg total) by mouth at bedtime. Patient taking differently: Take 1-2 tablets by mouth daily as needed. 05/02/20   Loletha Grayer, MD  Sodium Chloride, Inhalant, 7 % NEBU Inhale 4 mLs into the lungs daily.    [provider]    Family History  Problem Relation Age of Onset   Hypertension Mother    Hypertension Father      Social History   Tobacco Use   Smoking status: Never   Smokeless tobacco: Never  Vaping Use   Vaping Use: Never used  Substance Use Topics   Alcohol use: No   Drug use: No    Allergies as of 09/03/2020 - Review Complete 09/03/2020  Allergen Reaction Noted   Codeine Nausea And Vomiting 06/30/2016   Sulfa antibiotics Diarrhea and Anxiety 03/01/2018   Penicillins Rash 10/03/2013    Review of Systems:    All systems reviewed and negative except where noted in HPI.   Physical Exam:  Constitutional: General:   Alert,  Well-developed, well-nourished, pleasant and cooperative in NAD BP (!) 153/108   Pulse 88   Temp  (!) 96.8 F (36 C) (Temporal)   Resp 20   Ht '5\' 5"'$  (1.651 m)   Wt 44.9 kg   SpO2 100%   BMI 16.47 kg/m   Eyes:  Sclera clear, no icterus.   Conjunctiva Washington. PERRLA  Ears:  No scars, lesions or masses, Normal auditory acuity. Nose:  No deformity, discharge, or lesions. Mouth:  No deformity or lesions, oropharynx [place] & moist.  Neck:  Supple; no masses or thyromegaly.  Respiratory: Normal respiratory effort, Normal percussion  Gastrointestinal:  Normal bowel sounds.  No bruits.  Soft, non-tender and non-distended without masses, hepatosplenomegaly or hernias noted.  No guarding or rebound tenderness.     Cardiac: No clubbing or edema.  No cyanosis. Normal posterior tibial pedal pulses noted.  Lymphatic:  No significant cervical or axillary adenopathy.  Psych:  Alert and cooperative. Normal mood and affect.  Musculoskeletal:  Normal gait. Head normocephalic, atraumatic. Symmetrical without gross deformities. 5/5 Upper and Lower extremity strength bilaterally.  Skin: Warm. Intact without significant lesions or rashes. No jaundice.  Neurologic:  Face symmetrical, tongue midline, Normal sensation to touch;  grossly normal neurologically.  Psych:  Alert and oriented x3, Alert and cooperative. Normal mood and affect.   LAB RESULTS: Recent Labs    09/03/20 2030  WBC 11.3*  HGB 11.9*  HCT 33.9*  PLT 344   BMET Recent Labs    09/03/20 2030  NA 127*  K 3.9  CL 81*  CO2 34*  GLUCOSE 95  BUN 12  CREATININE 0.71  CALCIUM 9.4   LFT No results for input(s): PROT, ALBUMIN, AST, ALT, ALKPHOS, BILITOT, BILIDIR, IBILI in the last 72 hours. PT/INR No results for input(s): LABPROT, INR in the last 72 hours.  STUDIES: DG Chest 2 View  Result Date: 09/03/2020 CLINICAL DATA:  Choking EXAM: CHEST - 2 VIEW COMPARISON:  Radiograph 08/23/2020 FINDINGS: Unchanged cardiomediastinal silhouette. Bilateral postoperative changes. There are chronic parenchymal changes. Nodular opacity in  the right lower lung, similar to prior exam. Hiatal hernia. Persistent but increased left basilar consolidation. Small bilateral pleural effusions. No visible pneumothorax. Chronic bilateral rib injuries. Unchanged numerous vertebroplasties. IMPRESSION: Persistent but increased left basilar consolidation in comparison to prior exam, could represent atelectasis and known hiatal hernia. Superimposed infection is possible. Small bilateral  pleural effusions, unchanged from prior exam. Stable postoperative changes and chronic lung disease bilaterally. Unchanged chronic nodular opacities bilaterally. Electronically Signed   By: Maurine Simmering   On: 09/03/2020 21:04    CT abdomen pelvis July 31, 2020 showing large hiatal hernia containing most of the stomach as well as part of the transverse colon   Impression / Plan:   SONNIA LUKOWSKI is a 75 y.o. y/o female with symptoms of food impaction after eating dinner last night that consisted of chicken quesadilla.  Patient states she chewed her food really well, but has had dysphagia before and has a large hiatal hernia evaluated on imaging and upper endoscopy previously  Patient will need EGD for removal of food bolus which has risk factors due to her medical history  We did discuss that given her comorbidities, hiatal hernia (CT report reviewed as above), Eliquis on board, chronic lung disease (chest x-ray report reviewed as above), this is a high risk procedure.  However, leaving the food bolus in until Eliquis clear, would be higher risk, therefore benefits of procedure at this time would outweigh risks, to prevent risk of perforation   Guidelines recommend upper endoscopy for removal of foreign body in the esophagus within 24 hours of ingestion, therefore we will proceed at this time given that conservative treatments for this including glucagon and Ativan in the ER did not lead to successful results  Labs reviewed as above and show chronic anemia and chronic  hyponatremia, however, given the above risks, and these being chronic findings, would not recommend delaying endoscopy for these at this time  Pt will also need further evaluation of dysphagia. Will evaluate for any underlying strictures during the procedure today.   I have discussed alternative options, risks & benefits,  which include, but are not limited to, bleeding, infection, perforation,respiratory complication & drug reaction.  The patient agrees with this plan & written consent will be obtained.    Case discussed with anesthesia and they plan on intubating the patient given her above comorbidities.  Patient understands risks with intubation and these were discussed by anesthesia with the patient as well   Thank you for involving me in the care of this patient.      LOS: 0 days   Virgel Manifold, MD  09/04/2020, 7:31 AM

## 2020-09-07 ENCOUNTER — Encounter: Payer: Self-pay | Admitting: Gastroenterology

## 2020-09-07 NOTE — Progress Notes (Signed)
Patient ID: Ana Washington, female    DOB: [birth year], 75 y.o.   MRN: [id number]  HPI  Ana Washington is a 75 y/o female with a history of CAD, HTN, paroxysmal atrial fibrillation, bronchiectasis, dumping syndrome, GERD, PUD & chronic heart failure.   Echo report from 04/30/20 reviewed and showed an EF of 50-55% along with mild MR but without LVH/LAE.   Had endoscopy 09/04/20. Admitted 08/23/20 due to headache and worsening shortness of breath. Received leftover morphine at home and became obtunded. Given narcan with symptom improvement. Found to be in rapid atrial fib. Cardiology and palliative care consults obtained. PRN lasix changed to daily. Discharged after 4 days.   Ana Washington presents today for her initial visit with a chief complaint of moderate shortness of breath with little exertion. Ana Washington describes this as chronic in nature having been present for several years although does feel like it has worsened over the last several days. Ana Washington has associated fatigue, wet cough, wheezing, pedal edema, palpitations, constant abdominal pain, weakness and easy bruising along with this. Ana Washington denies any difficulty sleeping, abdominal distention, chest pain, dizziness or weight gain.   Furosemide has been increased to '40mg'$  daily but without improvement of symptoms.   Past Medical History:  Diagnosis Date   Arthritis    Atrial fibrillation ([place])    Bronchiectasis (HCC)    CAD (coronary artery disease) 07/30/2017   CHF (congestive heart failure) (HCC)    Dumping syndrome    Essential hypertension, malignant 10/03/2013   Family history of adverse reaction to anesthesia    sister [name]   GERD (gastroesophageal reflux disease)    Headache    MIGRAINES   Myocardial infarction ([place]) 2007   Non-STEMI   [name] (postoperative nausea and vomiting)    Psoriasis    PUD (peptic ulcer disease)    Past Surgical History:  Procedure Laterality Date   BACK SURGERY     CHOLECYSTECTOMY     ESOPHAGOGASTRODUODENOSCOPY N/A  09/04/2020   Procedure: ESOPHAGOGASTRODUODENOSCOPY (EGD);  Surgeon: Virgel Manifold, MD;  Location: [facility] ENDOSCOPY;  Service: Endoscopy;  Laterality: N/A;   ESOPHAGOGASTRODUODENOSCOPY (EGD) WITH PROPOFOL N/A 03/19/2019   Procedure: ESOPHAGOGASTRODUODENOSCOPY (EGD) WITH PROPOFOL;  Surgeon: [name], MD;  Location: [facility] ENDOSCOPY;  Service: Gastroenterology;  Laterality: N/A;  *Note to anesthesia: Per pt's pulmonologist, if intubating, please extubate to BIPAP.   EYE SURGERY     FOOT SURGERY     INTRAMEDULLARY (IM) NAIL INTERTROCHANTERIC Right 09/30/2018   Procedure: INTRAMEDULLARY (IM) NAIL INTERTROCHANTRIC;  Surgeon: Dereck Leep, MD;  Location: [facility] ORS;  Service: Orthopedics;  Laterality: Right;   KYPHOPLASTY N/A 07/05/2016   Procedure: KYPHOPLASTY T - 9;  Surgeon: Hessie Knows, MD;  Location: [facility] ORS;  Service: Orthopedics;  Laterality: N/A;   KYPHOPLASTY N/A 11/29/2017   Procedure: Iona Hansen;  Surgeon: Hessie Knows, MD;  Location: [facility] ORS;  Service: Orthopedics;  Laterality: N/A;  L2 and L3   KYPHOPLASTY N/A 12/18/2017   Procedure: KYPHOPLASTY L1;  Surgeon: Hessie Knows, MD;  Location: [facility] ORS;  Service: Orthopedics;  Laterality: N/A;   KYPHOPLASTY N/A 01/05/2018   Procedure: KYPHOPLASTY-T11,T12;  Surgeon: Hessie Knows, MD;  Location: [facility] ORS;  Service: Orthopedics;  Laterality: N/A;   KYPHOPLASTY N/A 04/05/2018   Procedure: T10 KYPHOPLASTY;  Surgeon: Hessie Knows, MD;  Location: [facility] ORS;  Service: Orthopedics;  Laterality: N/A;   KYPHOPLASTY N/A 04/12/2018   Procedure: KYPHOPLASTY T7,8;  Surgeon: Hessie Knows, MD;  Location: [facility] ORS;  Service: Orthopedics;  Laterality:  N/A;   KYPHOPLASTY N/A 04/19/2018   Procedure: KYPHOPLASTY T5, T6;  Surgeon: Hessie Knows, MD;  Location: [facility] ORS;  Service: Orthopedics;  Laterality: N/A;   LUNG SURGERY  1990 and 1996   THOROCOTOMY WITH LOBECTOMY     LEFT LOWER THORACOTOMY / RIGHT MIDDLE LOBECTOMY   Family History  Problem  Relation Age of Onset   Hypertension Mother    Hypertension Father    Social History   Tobacco Use   Smoking status: Never   Smokeless tobacco: Never  Substance Use Topics   Alcohol use: No   Allergies  Allergen Reactions   Codeine Nausea And Vomiting   Sulfa Antibiotics Diarrhea and Anxiety    "Makes her feel shaky."   Penicillins Rash    Has patient had a PCN reaction causing immediate rash, facial/tongue/throat swelling, SOB or lightheadedness with hypotension: Unknown Has patient had a PCN reaction causing severe rash involving mucus membranes or skin necrosis: Unknown Has patient had a PCN reaction that required hospitalization: Unknown Has patient had a PCN reaction occurring within the last 10 years: No If all of the above answers are "NO", then may proceed with Cephalosporin use.    Prior to Admission medications   Medication Sig Start Date End Date Taking? Authorizing Provider  acetaminophen (TYLENOL) 500 MG tablet Take 1,000 mg by mouth every 6 (six) hours as needed for mild pain or headache.    Yes [provider]  acetylcysteine (MUCOMYST) 20 % nebulizer solution Take 4 mLs by nebulization daily. 04/22/20  Yes [provider]  amiodarone (PACERONE) 200 MG tablet Take 1 tablet (200 mg total) by mouth 2 (two) times daily. 08/07/20  Yes Val Riles, MD  apixaban (ELIQUIS) 2.5 MG TABS tablet Take 1 tablet (2.5 mg total) by mouth 2 (two) times daily. 08/27/20 09/26/20 Yes Sreenath, Sudheer B, MD  Ascorbic Acid (VITAMIN C) 1000 MG tablet Take 1,000 mg by mouth daily.   Yes [provider]  benzonatate (TESSALON) 200 MG capsule Take 200 mg by mouth 3 (three) times daily as needed. 04/02/20  Yes [provider]  COMBIVENT RESPIMAT 20-100 MCG/ACT AERS respimat 1 puff every 6 (six) hours as needed for wheezing. 06/09/20  Yes [provider]  digoxin (LANOXIN) 0.125 MG tablet Take 125 mcg by mouth daily. 05/11/20  Yes [provider]   ferrous sulfate 325 (65 FE) MG EC tablet Take 325 mg by mouth daily.    Yes [provider]  furosemide (LASIX) 20 MG tablet Take 1 tablet (20 mg total) by mouth daily. Patient taking differently: Take 40 mg by mouth daily. 08/27/20  Yes Sreenath, Sudheer B, MD  gabapentin (NEURONTIN) 300 MG capsule Take 300 mg by mouth at bedtime as needed.   Yes [provider]  glycopyrrolate (ROBINUL) 1 MG tablet Take 1 mg by mouth 3 (three) times daily.   Yes [provider]  [facility]) OINT Apply 1 application topically as needed. 08/17/20  Yes [provider]  levalbuterol (XOPENEX) 0.31 MG/3ML nebulizer solution Inhale 3 mLs into the lungs every 6 (six) hours as needed. 12/10/19  Yes [provider]  levofloxacin (LEVAQUIN) 750 MG tablet Take 750 mg by mouth once. 09/07/20 05/15/21 Yes [provider]  LORazepam (ATIVAN) 0.5 MG tablet Take 1 tablet (0.5 mg total) by mouth at bedtime. 09/10/19  Yes Enzo Bi, MD  metoprolol tartrate (LOPRESSOR) 25 MG tablet Take 1 tablet (25 mg total) by mouth 2 (two) times daily.  08/07/20  Yes Val Riles, MD  NAC 600 MG CAPS Take 600 mg by mouth daily after breakfast.  08/17/19  Yes [provider]  omeprazole (PRILOSEC) 20 MG capsule Take 20 mg by mouth daily. 08/26/19  Yes [provider]  polyethylene glycol (MIRALAX / GLYCOLAX) 17 g packet Take 17 g by mouth daily as needed for severe constipation. 05/02/20  Yes Wieting, Richard, MD  potassium chloride (KLOR-CON) 10 MEQ tablet Take 10 mEq by mouth daily. 08/16/20  Yes [provider]  predniSONE (DELTASONE) 10 MG tablet Take 10 mg by mouth daily. 08/07/20  Yes [provider]  Probiotic Product (ALIGN) 4 MG CAPS Take 4 mg by mouth daily.   Yes [provider]  senna (SENOKOT) 8.6 MG TABS tablet Take 2 tablets (17.2 mg total) by mouth at bedtime. Patient taking differently: Take 1-2 tablets by mouth daily as  needed. 05/02/20  Yes Wieting, Richard, MD  Sodium Chloride, Inhalant, 7 % NEBU Inhale 4 mLs into the lungs daily.   Yes [provider]    Review of Systems  Constitutional:  Positive for fatigue. Negative for appetite change.  HENT:  Positive for hearing loss. Negative for congestion, postnasal drip and trouble swallowing.   Eyes: Negative.   Respiratory:  Positive for cough, shortness of breath (worsening) and wheezing (worsening).   Cardiovascular:  Positive for palpitations and leg swelling. Negative for chest pain.  Gastrointestinal:  Positive for abdominal pain ("all the time"). Negative for abdominal distention.  Endocrine: Negative.   Genitourinary: Negative.   Musculoskeletal:  Negative for back pain and neck pain.  Skin: Negative.   Allergic/Immunologic: Negative.   Neurological:  Positive for weakness. Negative for dizziness and light-headedness.  Hematological:  Negative for adenopathy. Bruises/bleeds easily.  Psychiatric/Behavioral:  Negative for dysphoric mood and sleep disturbance (sleeping on 3 pillows). The patient is not nervous/anxious.    Vitals:   09/08/20 1136  BP: (!) 161/90  Pulse: 60  Resp: (!) 24  SpO2: 99%  Weight: 90 lb (40.8 kg)  Height: '5\' 4"'$  (1.626 m)   Wt Readings from Last 3 Encounters:  09/08/20 90 lb (40.8 kg)  09/03/20 99 lb (44.9 kg)  08/26/20 101 lb 10.1 oz (46.1 kg)   Lab Results  Component Value Date   CREATININE 0.71 09/03/2020   CREATININE 0.65 08/27/2020   CREATININE 0.61 08/26/2020    Physical Exam Vitals and nursing note reviewed. Exam conducted with a chaperone present (caregiver).  Constitutional:      Appearance: Normal appearance.  HENT:     Head: Normocephalic and atraumatic.     Right Ear: Decreased hearing noted.     Left Ear: Decreased hearing noted.  Cardiovascular:     Rate and Rhythm: Normal rate and regular rhythm.  Pulmonary:     Effort: Tachypnea present. No accessory muscle usage.     Breath  sounds: Rhonchi (throughout all lung fields) and rales (lower lobes) present.  Abdominal:     General: There is no distension.     Palpations: Abdomen is soft.     Tenderness: There is abdominal tenderness.  Musculoskeletal:        General: No tenderness.     Cervical back: Normal range of motion and neck supple.     Right lower leg: Edema (2+ pitting) present.     Left lower leg: Edema (2+ pitting) present.  Skin:    General: Skin is warm and dry.     Findings: Bruising (bilateral shins  and arms) present.  Neurological:     General: No focal deficit present.     Mental Status: Ana Washington is alert and oriented to person, place, and time.  Psychiatric:        Mood and Affect: Mood normal.        Behavior: Behavior normal.        Thought Content: Thought content normal.    Assessment & Plan:  1: Acute on Chronic heart failure with preserved ejection fraction without structural changes- - NYHA class III - moderately fluid overloaded today with rales, swelling and worsening shortness of breath - already weighing daily; reminded to call for an overnight weight gain of > 2 pounds or a weekly weight gain of > 5 pounds - will send for '80mg'$  IV lasix/ 69mq PO Potassium today - draw BMP/BNP today as well - receiving home health - has initial paramedicine appt tomorrow - BNP 08/25/20 was 1154.6  2: HTN- - BP mildly elevated today (161/90) although low at appt yesterday with Dr. FUbaldo Glassing(108/72); home BP record shows rising BP - saw PCP (Kary Kos 09/03/20 - BMP 09/03/20 reviewed and showed sodium 131, potassium 3.8, creatinine 0.6 and GFR 97  3: Paroxysmal atrial fibrillation- - saw cardiology (Fath) 09/07/20 & had furosemide increased to '40mg'$  daily - taking apixaban, amiodarone and metoprolol tartrate  4: Bronchiectasis- - saw pulmonology (Lanney Gins 08/18/20 - antibiotic resumed yesterday - using nebulizer and wearing oxygen at 2L around the clock; changed out her nasal cannula in the office as it  was crimped at the end and was unable to stretch it back open  5: Lymphedema- - stage 1 - elevates her legs when sitting for long periods of time - edema usually resolves overnight after being in the bed - has not worn compression socks and Ana Washington was instructed to get a pair and put them on every morning with removal at bedtime - consider compression boots if edema persists   Patient did not bring her medications nor a list. Each medication was verbally reviewed with the patient and Ana Washington was encouraged to bring the bottles to every visit to confirm accuracy of list.   Since paramedic can assess patient tomorrow, will have her return to the clinic in 6 days. Advised her to call back if this conflicts with other upcoming appointments that Ana Washington has.

## 2020-09-08 ENCOUNTER — Telehealth: Payer: Self-pay

## 2020-09-08 ENCOUNTER — Ambulatory Visit (HOSPITAL_BASED_OUTPATIENT_CLINIC_OR_DEPARTMENT_OTHER): Payer: Medicare HMO | Admitting: Family

## 2020-09-08 ENCOUNTER — Other Ambulatory Visit: Payer: Self-pay

## 2020-09-08 ENCOUNTER — Encounter: Payer: Self-pay | Admitting: Family

## 2020-09-08 ENCOUNTER — Ambulatory Visit
Admission: RE | Admit: 2020-09-08 | Discharge: 2020-09-08 | Disposition: A | Discharge status: Home / Self Care | Payer: Medicare HMO | Source: Ambulatory Visit | Attending: Family | Admitting: Family

## 2020-09-08 ENCOUNTER — Other Ambulatory Visit: Payer: Self-pay | Admitting: Family

## 2020-09-08 VITALS — BP 161/90 | HR 60 | Resp 24 | Ht 64.0 in | Wt 90.0 lb

## 2020-09-08 DIAGNOSIS — R002 Palpitations: Secondary | ICD-10-CM | POA: Insufficient documentation

## 2020-09-08 DIAGNOSIS — J479 Bronchiectasis, uncomplicated: Secondary | ICD-10-CM | POA: Diagnosis not present

## 2020-09-08 DIAGNOSIS — I5033 Acute on chronic diastolic (congestive) heart failure: Secondary | ICD-10-CM | POA: Insufficient documentation

## 2020-09-08 DIAGNOSIS — K219 Gastro-esophageal reflux disease without esophagitis: Secondary | ICD-10-CM | POA: Insufficient documentation

## 2020-09-08 DIAGNOSIS — I480 Paroxysmal atrial fibrillation: Secondary | ICD-10-CM | POA: Insufficient documentation

## 2020-09-08 DIAGNOSIS — Z8249 Family history of ischemic heart disease and other diseases of the circulatory system: Secondary | ICD-10-CM | POA: Insufficient documentation

## 2020-09-08 DIAGNOSIS — I10 Essential (primary) hypertension: Secondary | ICD-10-CM

## 2020-09-08 DIAGNOSIS — I2510 Atherosclerotic heart disease of native coronary artery without angina pectoris: Secondary | ICD-10-CM | POA: Insufficient documentation

## 2020-09-08 DIAGNOSIS — I890 Lymphedema, not elsewhere classified: Secondary | ICD-10-CM

## 2020-09-08 DIAGNOSIS — R058 Other specified cough: Secondary | ICD-10-CM | POA: Diagnosis present

## 2020-09-08 DIAGNOSIS — Z7901 Long term (current) use of anticoagulants: Secondary | ICD-10-CM | POA: Diagnosis not present

## 2020-09-08 DIAGNOSIS — Z7952 Long term (current) use of systemic steroids: Secondary | ICD-10-CM | POA: Diagnosis not present

## 2020-09-08 DIAGNOSIS — Z79899 Other long term (current) drug therapy: Secondary | ICD-10-CM | POA: Diagnosis not present

## 2020-09-08 DIAGNOSIS — R109 Unspecified abdominal pain: Secondary | ICD-10-CM | POA: Insufficient documentation

## 2020-09-08 DIAGNOSIS — I110 Hypertensive heart disease with heart failure: Secondary | ICD-10-CM | POA: Insufficient documentation

## 2020-09-08 DIAGNOSIS — R531 Weakness: Secondary | ICD-10-CM | POA: Diagnosis not present

## 2020-09-08 DIAGNOSIS — J471 Bronchiectasis with (acute) exacerbation: Secondary | ICD-10-CM

## 2020-09-08 LAB — BASIC METABOLIC PANEL
Anion gap: 12 (ref 5–15)
BUN: 13 mg/dL (ref 8–23)
CO2: 38 mmol/L — ABNORMAL HIGH (ref 22–32)
Calcium: 9.4 mg/dL (ref 8.9–10.3)
Chloride: 81 mmol/L — ABNORMAL LOW (ref 98–111)
Creatinine, Ser: 0.54 mg/dL (ref 0.44–1.00)
GFR, Estimated: >60 mL/min (ref >60)
Glucose, Bld: 95 mg/dL (ref 70–99)
Potassium: 3.6 mmol/L (ref 3.5–5.1)
Sodium: 131 mmol/L — ABNORMAL LOW (ref 135–145)

## 2020-09-08 LAB — BRAIN NATRIURETIC PEPTIDE: B Natriuretic Peptide: 403.2 pg/mL — ABNORMAL HIGH (ref 0.0–100.0)

## 2020-09-08 MED ORDER — FUROSEMIDE 10 MG/ML IJ SOLN: 80.0000 mg | Freq: Once | INTRAMUSCULAR | Status: AC

## 2020-09-08 MED ORDER — POTASSIUM CHLORIDE CRYS ER 20 MEQ PO TBCR
40.0000 meq | EXTENDED_RELEASE_TABLET | Freq: Once | ORAL | Status: AC
  Administered 2020-09-08: 40 meq via ORAL

## 2020-09-08 MED ORDER — POTASSIUM CHLORIDE CRYS ER 20 MEQ PO TBCR
EXTENDED_RELEASE_TABLET | ORAL | Status: AC
  Filled 2020-09-08: qty 2

## 2020-09-08 MED ORDER — FUROSEMIDE 10 MG/ML IJ SOLN
INTRAMUSCULAR | Status: AC
  Administered 2020-09-08: 80 mg via INTRAVENOUS
  Filled 2020-09-08: qty 8

## 2020-09-08 NOTE — Discharge Instructions (Signed)
           Note Details  Author Chanda Busing, RN File Time 09/08/2020  2:10 PM  Author Type Registered Nurse Status Signed  Last Editor Chanda Busing, RN Service (none)  [place] # [id number] Admit Date 09/08/2020

## 2020-09-08 NOTE — Progress Notes (Signed)
1347:  Pt. came in for Medical day. Orders given per medical provider's order. While preparing the patient for discharge, patient stated, that she does not feel good and she is so weak. Her BP was 196/96 and respiration was 26.  Pt also said she has a headache. This RN and Agricultural consultant [employer]), advised patient to go to emergency room to get evaluated but patient  refused. Pt " I'll be okay, if I die, I die. If the Reita Cliche is ready for me, He 'll take me" So, patient left the floor, via wheelchair assisted by staff. She was also instructed to take her PRN medications as needed as well.

## 2020-09-08 NOTE — Telephone Encounter (Signed)
-----   Message from Virgel Manifold, MD sent at 09/04/2020  2:39 PM EDT ----- Please make clinic follow up with Dr. Vicente Males for follow up of dysphagia in the next couple weeks, due to recent food impaction as well. Dr. Vicente Males said ok to message you to make appointment.

## 2020-09-08 NOTE — Telephone Encounter (Signed)
Called patient to schedule her an appointment per Dr. Michele Mcalpine request and patient requested a Monday. Therefore, I told her that I would put her on the schedule for 09/28/2020 at 1:00 pm.

## 2020-09-08 NOTE — Patient Instructions (Addendum)
Continue weighing daily and call for an overnight weight gain of > 2 pounds or a weekly weight gain of >5 pounds.    Get compression socks and put them on every morning with removal at bedtime

## 2020-09-09 ENCOUNTER — Encounter (HOSPITAL_COMMUNITY): Payer: Self-pay

## 2020-09-09 ENCOUNTER — Other Ambulatory Visit (HOSPITAL_COMMUNITY): Payer: Self-pay

## 2020-09-09 ENCOUNTER — Telehealth: Payer: Self-pay | Admitting: Family

## 2020-09-09 NOTE — Telephone Encounter (Signed)
Received phone call from paramedic who is there to for initial appointment with patient for the paramedicine program. Patient weighs 88.6 pounds today and 2 days ago she weighed 100.2 pounds.   She was given '80mg'$  IV lasix yesterday and has not taken her furosemide yet today. Advised Kristi (paramedic) to have patient hold her furosemide today

## 2020-09-09 NOTE — Progress Notes (Signed)
Today had a first home visit with Ana Washington.  She states feels bad but not as bad as yesterday.  She had IV lasix yesterday and seen HF clinic, her weight is 2 lbs from yesterday and down 12 lbs in past 3 days.  She has no edema in lower legs and abdomen is not tight.  Lungs are rhonchi in all fields. She states she is on antibiotic that usually helps but her lungs are always bad.  She denies any chest pain, headaches, she has some dizziness.  She has not ate much in past few days from due to having the food caught and stretching her esophagus.  She is back eating normally today.  She has been taking her meds.  Discussed with [name] at HF clinic about weight and she advised for her to hold her lasix today.  Pulled lasix out for today and filled her med box for 1 week.  Called in refills for metoprolol.  She will be out in 2 days, her caregiver is aware to place in med box.  Her sister was filling med box for her but was getting confused lately.  Will fill them on visits.  Explained the program and she is wanting the visits.  She also has [facility] that visits.  She is aware when to call for help and when to call 911.  She has 24 care for her and her husband, either by family her a friend that they pay.  She has everything she needs for daily living.  She did appear very nervous acting when arrived nad caregiver said she has been that way for last few days.  Pt mentioned that she is shakey, she has ativan and she took a 1/2 of one because her family does not like her sleeping all day.  Did notice she stopped shaking and appears to be less anxious prior to me leaving, caregiver agreed.  Will continue to visit for heart failure, diet nad medication compliance.   [place] [phone]

## 2020-09-13 ENCOUNTER — Emergency Department: Payer: Medicare HMO

## 2020-09-13 ENCOUNTER — Other Ambulatory Visit: Payer: Self-pay

## 2020-09-13 ENCOUNTER — Observation Stay
Admission: EM | Admit: 2020-09-13 | Discharge: 2020-09-15 | Disposition: A | Discharge status: Home / Self Care | Payer: Medicare HMO | Attending: Internal Medicine | Admitting: Internal Medicine

## 2020-09-13 DIAGNOSIS — I5033 Acute on chronic diastolic (congestive) heart failure: Secondary | ICD-10-CM | POA: Diagnosis not present

## 2020-09-13 DIAGNOSIS — I480 Paroxysmal atrial fibrillation: Secondary | ICD-10-CM | POA: Diagnosis not present

## 2020-09-13 DIAGNOSIS — K222 Esophageal obstruction: Secondary | ICD-10-CM | POA: Diagnosis not present

## 2020-09-13 DIAGNOSIS — R778 Other specified abnormalities of plasma proteins: Secondary | ICD-10-CM

## 2020-09-13 DIAGNOSIS — R1310 Dysphagia, unspecified: Secondary | ICD-10-CM

## 2020-09-13 DIAGNOSIS — U071 COVID-19: Secondary | ICD-10-CM | POA: Insufficient documentation

## 2020-09-13 DIAGNOSIS — Z8616 Personal history of COVID-19: Secondary | ICD-10-CM | POA: Diagnosis not present

## 2020-09-13 DIAGNOSIS — I2510 Atherosclerotic heart disease of native coronary artery without angina pectoris: Secondary | ICD-10-CM | POA: Diagnosis not present

## 2020-09-13 DIAGNOSIS — Z79899 Other long term (current) drug therapy: Secondary | ICD-10-CM | POA: Insufficient documentation

## 2020-09-13 DIAGNOSIS — Z7901 Long term (current) use of anticoagulants: Secondary | ICD-10-CM | POA: Insufficient documentation

## 2020-09-13 DIAGNOSIS — I110 Hypertensive heart disease with heart failure: Secondary | ICD-10-CM | POA: Insufficient documentation

## 2020-09-13 DIAGNOSIS — R079 Chest pain, unspecified: Secondary | ICD-10-CM | POA: Diagnosis not present

## 2020-09-13 DIAGNOSIS — M6281 Muscle weakness (generalized): Secondary | ICD-10-CM | POA: Diagnosis not present

## 2020-09-13 LAB — COMPREHENSIVE METABOLIC PANEL
ALT: 12 U/L (ref 0–44)
AST: 26 U/L (ref 15–41)
Albumin: 3.4 g/dL — ABNORMAL LOW (ref 3.5–5.0)
Alkaline Phosphatase: 45 U/L (ref 38–126)
Anion gap: 11 (ref 5–15)
BUN: 9 mg/dL (ref 8–23)
CO2: 42 mmol/L — ABNORMAL HIGH (ref 22–32)
Calcium: 9.3 mg/dL (ref 8.9–10.3)
Chloride: 75 mmol/L — ABNORMAL LOW (ref 98–111)
Creatinine, Ser: 0.77 mg/dL (ref 0.44–1.00)
GFR, Estimated: >60 mL/min (ref >60)
Glucose, Bld: 88 mg/dL (ref 70–99)
Potassium: 3.2 mmol/L — ABNORMAL LOW (ref 3.5–5.1)
Sodium: 128 mmol/L — ABNORMAL LOW (ref 135–145)
Total Bilirubin: 1 mg/dL (ref 0.3–1.2)
Total Protein: 6 g/dL — ABNORMAL LOW (ref 6.5–8.1)

## 2020-09-13 LAB — TROPONIN I (HIGH SENSITIVITY)
Troponin I (High Sensitivity): 157 ng/L (ref <18)
Troponin I (High Sensitivity): 158 ng/L (ref <18)

## 2020-09-13 LAB — CBC
HCT: 36.9 % (ref 36.0–46.0)
Hemoglobin: 13.1 g/dL (ref 12.0–15.0)
MCH: 34.7 pg — ABNORMAL HIGH (ref 26.0–34.0)
MCHC: 35.5 g/dL (ref 30.0–36.0)
MCV: 97.9 fL (ref 80.0–100.0)
Platelets: 340 K/uL (ref 150–400)
RBC: 3.77 MIL/uL — ABNORMAL LOW (ref 3.87–5.11)
RDW: 12.9 % (ref 11.5–15.5)
WBC: 16.4 K/uL — ABNORMAL HIGH (ref 4.0–10.5)
nRBC: 0 % (ref 0.0–0.2)

## 2020-09-13 MED ORDER — LACTATED RINGERS IV BOLUS
500.0000 mL | Freq: Once | INTRAVENOUS | Status: AC
Start: 2020-09-13 — End: 2020-09-14
  Administered 2020-09-13: 500 mL via INTRAVENOUS

## 2020-09-13 MED ORDER — LACTATED RINGERS IV SOLN: INTRAVENOUS | Status: DC

## 2020-09-13 NOTE — ED Triage Notes (Addendum)
Per ems pt was eating cantelope tonight and felt sensation of food bolus. Pt is able to speak in full sentences and appears in no acute distress, no drooling, ems gave '4mg'$  of zofran. Pt wears 2lpm of oxygen all the time. Pt states is shob

## 2020-09-13 NOTE — ED Notes (Signed)
Patient is awake and alert, respirations are even and regular.  Patient is complaining that she swallowed a piece of canteloupe and "it is stuck." Patient speaking in full sentences, daughter at bedside.  Bed in low and locked position, call bell in reach.

## 2020-09-13 NOTE — ED Provider Notes (Signed)
[facility] Emergency Department Provider Note  ____________________________________________  Time seen: Approximately 11:39 PM  I have reviewed the triage vital signs and the nursing notes.   HISTORY  Chief Complaint food bolus   HPI Ana Washington is a 75 y.o. female with a history of tortuous esophagus, recurrent food bolus impactions, hiatal hernia, A. Fib on Eliquis, CAD, CHF, hypertension, chronic hyponatremia, gastric bezoar, bronchiectasis on 2 L nasal cannula who presents for evaluation of food bolus impaction.  Patient was seen here 9 days ago with similar complaint.  Had an endoscopy showing torturous esophagus with food impaction throughout the entire extent of the esophagus.  She reports that since she went home she has not felt fully back to normal.  Has been having difficulty swallowing solids.  This morning she was eating cantaloupe and reports that since then has been unable to swallow any solids.  She is able to have fluids and reports that she hears a lot of bubbling and it is very hard for the fluid to go down but she has not been vomiting fluids.  Unable to tolerate solids.  She also reports that earlier today she had some mild chest pain that lasted a couple of minutes and resolved.  She describes the pain as dull.  She denies any changes in her chronic shortness of breath.  She denies fever or chills.  She denies any active chest pain.  She denies abdominal pain.   Past Medical History:  Diagnosis Date   Arthritis    Atrial fibrillation (HCC)    Bronchiectasis (HCC)    CAD (coronary artery disease) 07/30/2017   CHF (congestive heart failure) (HCC)    Dumping syndrome    Essential hypertension, malignant 10/03/2013   Family history of adverse reaction to anesthesia    sister [name]   GERD (gastroesophageal reflux disease)    Headache    MIGRAINES   Myocardial infarction ([place]) 2007   Non-STEMI   [name] (postoperative nausea and vomiting)     Psoriasis    PUD (peptic ulcer disease)     Patient Active Problem List   Diagnosis Date Noted   Food impaction of esophagus    Congenital malformation of esophagus    Hiatal hernia    Gastric bezoar    Foreign body in esophagus    Diverticulosis of small intestine with hemorrhage    CHF (congestive heart failure) ([place]) 08/24/2020   Chronic hyponatremia 08/23/2020   Acute on chronic diastolic CHF (congestive heart failure) ([place]) 08/23/2020   Morphine Overdose, accidental or unintentional, initial encounter 08/23/2020   Bronchiectasis with acute exacerbation ([place]) 07/31/2020   CAP (community acquired pneumonia) 07/30/2020   Acute exacerbation of bronchiectasis ([place]) 07/14/2020   COVID-19 virus infection 07/14/2020   Lactic acidosis 07/14/2020   SIRS (systemic inflammatory response syndrome) ([place]) 07/14/2020   Elevated troponin level not due myocardial infarction 07/14/2020   Anxiety    Constipation    Pressure injury of right heel, stage 1    Bronchiectasis with (acute) exacerbation ([place]) 04/29/2020   Hemoptysis    Chronic respiratory failure with hypoxia ([place])    AKI (acute kidney injury) ([place])    Bronchiectasis ([place]) 09/08/2019   Atrial fibrillation, chronic ([place]) 09/08/2019   Closed right hip fracture ([place]) 09/27/2018   Intractable nausea and vomiting 04/08/2018   Atrial fibrillation with RVR ([place]) 04/08/2018   Thoracic radiculitis (Bilateral) 03/29/2018   Vasovagal episode 03/29/2018   Closed compression fracture of T10 thoracic vertebra, sequela  03/29/2018   Abnormal MRI, lumbar spine (12/15/2016) 03/21/2018   Lumbar compression fractures, sequela (L1, L2, L3, L4, and L5) 03/21/2018   Closed compression fracture of L1 lumbar vertebra, sequela 03/21/2018   Closed compression fracture of L2 lumbar vertebra, sequela 03/21/2018   Closed compression fracture of L3 lumbar vertebra, sequela 03/21/2018   Closed compression fracture of L4 lumbar vertebra, sequela 03/21/2018    Closed compression fracture of L5 lumbar vertebra, sequela 03/21/2018   Thoracic compression fracture, sequela (T5, T9, T10, T11, and T12) 03/21/2018   Closed compression fracture of T5 thoracic vertebra, sequela 03/21/2018   Closed compression fracture of T9 thoracic vertebra, sequela 03/21/2018   Close compression fracture of T11 thoracic vertebra, sequela 03/21/2018   Closed compression fracture of T12 thoracic vertebra, sequela 03/21/2018   Lumbar facet hypertrophy 03/21/2018   Grade 1  Lumbar Anterolisthesis of L3/4 and L4/5 03/21/2018   Lumbar central spinal stenosis (Multilevel), 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 claudication 03/21/2018   Chronic anticoagulation (ELIQUIS) 03/21/2018   History of pelvic fracture 03/21/2018   Chronic musculoskeletal pain 03/21/2018   Neurogenic pain 03/21/2018   Long term prescription benzodiazepine use 03/21/2018   DDD (degenerative disc disease), 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   Adult bronchiectasis ([place]) 03/01/2018   Diverticulitis 03/01/2018   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[place]) 03/01/2018   Migraines 03/01/2018   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   Osteoporosis, 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   Psoriasis 03/01/2018   Chronic upper back pain (Primary Area of Pain) (Bilateral) (R>L) 03/01/2018   Chronic low back pain (Secondary Area of Pain) (Bilateral) (R>L) w/o sciatica 03/01/2018   Chronic pain syndrome 03/01/2018   Long term current use of opiate analgesic 03/01/2018   Pharmacologic therapy 03/01/2018   Disorder of skeletal system 03/01/2018   Problems influencing health status 03/01/2018   History of kyphoplasty (L1, L2, L3, T9, T11, and T12) 01/05/2018   Malnutrition of moderate degree ([place]) 08/03/2017   GERD (gastroesophageal reflux disease) 07/30/2017   Pelvic fracture ([place]) 07/30/2017   AF (paroxysmal atrial fibrillation) ([place]) 07/30/2017   Other dysphagia 09/13/2016   Unintended weight loss 09/13/2016   Essential hypertension 10/03/2013    Osteoarthritis of knees (Bilateral) 10/03/2013   Primary localized osteoarthrosis, lower leg 10/03/2013   Non-ischemic cardiomyopathy ([place]) 09/12/2013   Coronary artery disease involving native coronary artery of native heart without angina pectoris 07/28/2012   Hyperlipidemia, mixed 07/28/2012   DDD (degenerative disc disease), lumbar 02/29/2012    Past Surgical History:  Procedure Laterality Date   BACK SURGERY     CHOLECYSTECTOMY     ESOPHAGOGASTRODUODENOSCOPY N/A 09/04/2020   Procedure: ESOPHAGOGASTRODUODENOSCOPY (EGD);  Surgeon: Virgel Manifold, MD;  Location: [facility] ENDOSCOPY;  Service: Endoscopy;  Laterality: N/A;   ESOPHAGOGASTRODUODENOSCOPY (EGD) WITH PROPOFOL N/A 03/19/2019   Procedure: ESOPHAGOGASTRODUODENOSCOPY (EGD) WITH PROPOFOL;  Surgeon: [name], MD;  Location: [facility] ENDOSCOPY;  Service: Gastroenterology;  Laterality: N/A;  *Note to anesthesia: Per pt's pulmonologist, if intubating, please extubate to BIPAP.   EYE SURGERY     FOOT SURGERY     INTRAMEDULLARY (IM) NAIL INTERTROCHANTERIC Right 09/30/2018   Procedure: INTRAMEDULLARY (IM) NAIL INTERTROCHANTRIC;  Surgeon: Dereck Leep, MD;  Location: [facility] ORS;  Service: Orthopedics;  Laterality: Right;   KYPHOPLASTY N/A 07/05/2016   Procedure: KYPHOPLASTY T - 9;  Surgeon: Hessie Knows, MD;  Location: [facility] ORS;  Service: Orthopedics;  Laterality: N/A;   KYPHOPLASTY N/A 11/29/2017   Procedure: Iona Hansen;  Surgeon: Hessie Knows, MD;  Location: [facility] ORS;  Service: Orthopedics;  Laterality: N/A;  L2 and L3  KYPHOPLASTY N/A 12/18/2017   Procedure: KYPHOPLASTY L1;  Surgeon: Hessie Knows, MD;  Location: [facility] ORS;  Service: Orthopedics;  Laterality: N/A;   KYPHOPLASTY N/A 01/05/2018   Procedure: KYPHOPLASTY-T11,T12;  Surgeon: Hessie Knows, MD;  Location: [facility] ORS;  Service: Orthopedics;  Laterality: N/A;   KYPHOPLASTY N/A 04/05/2018   Procedure: T10 KYPHOPLASTY;  Surgeon: Hessie Knows, MD;  Location: [facility] ORS;  Service:  Orthopedics;  Laterality: N/A;   KYPHOPLASTY N/A 04/12/2018   Procedure: KYPHOPLASTY T7,8;  Surgeon: Hessie Knows, MD;  Location: [facility] ORS;  Service: Orthopedics;  Laterality: N/A;   KYPHOPLASTY N/A 04/19/2018   Procedure: KYPHOPLASTY T5, T6;  Surgeon: Hessie Knows, MD;  Location: [facility] ORS;  Service: Orthopedics;  Laterality: N/A;   LUNG SURGERY  1990 and 1996   THOROCOTOMY WITH LOBECTOMY     LEFT LOWER THORACOTOMY / RIGHT MIDDLE LOBECTOMY    Prior to Admission medications   Medication Sig Start Date End Date Taking? Authorizing Provider  acetaminophen (TYLENOL) 500 MG tablet Take 1,000 mg by mouth every 6 (six) hours as needed for mild pain or headache.    Yes [provider]  acetylcysteine (MUCOMYST) 20 % nebulizer solution Take 4 mLs by nebulization daily. 04/22/20  Yes [provider]  amiodarone (PACERONE) 200 MG tablet Take 1 tablet (200 mg total) by mouth 2 (two) times daily. 08/07/20  Yes Val Riles, MD  amLODipine (NORVASC) 2.5 MG tablet Take 2.5 mg by mouth daily. 08/21/20  Yes [provider]  apixaban (ELIQUIS) 2.5 MG TABS tablet Take 1 tablet (2.5 mg total) by mouth 2 (two) times daily. 08/27/20 09/26/20 Yes Sreenath, Sudheer B, MD  Ascorbic Acid (VITAMIN C) 1000 MG tablet Take 1,000 mg by mouth daily.   Yes [provider]  benzonatate (TESSALON) 200 MG capsule Take 200 mg by mouth 3 (three) times daily as needed. 04/02/20  Yes [provider]  COMBIVENT RESPIMAT 20-100 MCG/ACT AERS respimat 1 puff every 6 (six) hours as needed for wheezing. 06/09/20  Yes [provider]  digoxin (LANOXIN) 0.125 MG tablet Take 125 mcg by mouth daily. 05/11/20  Yes [provider]  ferrous sulfate 325 (65 FE) MG EC tablet Take 325 mg by mouth daily.    Yes [provider]  furosemide (LASIX) 20 MG tablet Take 1 tablet (20 mg total) by mouth daily. Patient taking differently: Take 40 mg by mouth daily. 08/27/20  Yes Sreenath, Sudheer  B, MD  gabapentin (NEURONTIN) 300 MG capsule Take 300 mg by mouth at bedtime as needed.   Yes [provider]  glycopyrrolate (ROBINUL) 1 MG tablet Take 1 mg by mouth 3 (three) times daily.   Yes [provider]  [place]) OINT Apply 1 application topically as needed. 08/17/20  Yes [provider]  levalbuterol (XOPENEX) 0.31 MG/3ML nebulizer solution Inhale 3 mLs into the lungs every 6 (six) hours as needed. 12/10/19  Yes [provider]  levofloxacin (LEVAQUIN) 250 MG tablet Take by mouth. 09/08/20  Yes [provider]  LORazepam (ATIVAN) 0.5 MG tablet Take 1 tablet (0.5 mg total) by mouth at bedtime. 09/10/19  Yes Enzo Bi, MD  losartan (COZAAR) 100 MG tablet Take 100 mg by mouth daily. 08/28/20  Yes [provider]  metoprolol succinate (TOPROL-XL) 100 MG 24 hr tablet Take 100 mg by mouth daily. 08/31/20  Yes [provider]  NAC 600 MG CAPS Take 600 mg by mouth daily after breakfast.  08/17/19  Yes [provider]  omeprazole (PRILOSEC) 20 MG capsule Take 20 mg by mouth daily. 08/26/19  Yes [provider]  polyethylene glycol (MIRALAX / GLYCOLAX) 17 g packet Take 17 g by mouth daily as needed for severe constipation. 05/02/20  Yes Wieting, Richard, MD  potassium chloride (KLOR-CON) 10 MEQ tablet Take 10 mEq by mouth daily. 08/16/20  Yes [provider]  predniSONE (DELTASONE) 10 MG tablet Take 10 mg by mouth daily. 08/07/20  Yes [provider]  Probiotic Product (ALIGN) 4 MG CAPS Take 4 mg by mouth daily.   Yes [provider]  senna (SENOKOT) 8.6 MG TABS tablet Take 2 tablets (17.2 mg total) by mouth at bedtime. Patient taking differently: Take 1-2 tablets by mouth daily as needed. 05/02/20  Yes Wieting, Richard, MD  Sodium Chloride, Inhalant, 7 % NEBU Inhale 4 mLs into the lungs daily.   Yes [provider]    Allergies Codeine, Sulfa antibiotics, and  Penicillins  Family History  Problem Relation Age of Onset   Hypertension Mother    Hypertension Father     Social History Social History   Tobacco Use   Smoking status: Never   Smokeless tobacco: Never  Vaping Use   Vaping Use: Never used  Substance Use Topics   Alcohol use: No   Drug use: No    Review of Systems  Constitutional: Negative for fever. Eyes: Negative for visual changes. ENT: Negative for sore throat. Neck: No neck pain  Cardiovascular: + chest pain. Respiratory: Negative for shortness of breath. Gastrointestinal: Negative for abdominal pain, vomiting or diarrhea. Genitourinary: Negative for dysuria. Musculoskeletal: Negative for back pain. Skin: Negative for rash. Neurological: Negative for headaches, weakness or numbness. Psych: No SI or HI  ____________________________________________   PHYSICAL EXAM:  VITAL SIGNS: ED Triage Vitals  Enc Vitals Group     BP 09/13/20 2035 134/80     Pulse Rate 09/13/20 2035 64     Resp 09/13/20 2033 20     Temp 09/13/20 2035 98.2 F (36.8 C)     Temp Source 09/13/20 2033 Oral     SpO2 09/13/20 2035 99 %     Weight 09/13/20 2033 88 lb 2.9 oz (40 kg)     Height 09/13/20 2033 '5\' 4"'$  (1.626 m)     Head Circumference --      Peak Flow --      Pain Score 09/13/20 2033 0     Pain Loc --      Pain Edu? --      Excl. in [place]? --     Constitutional: Alert and oriented, no apparent distress. HEENT:      Head: Normocephalic and atraumatic.         Eyes: Conjunctivae are normal. Sclera is non-icteric.       Mouth/Throat: Mucous membranes are moist.       Neck: Supple with no signs of meningismus. Cardiovascular: Regular rate and rhythm. No murmurs, gallops, or rubs. 2+ symmetrical distal pulses are present in all extremities. No JVD. Respiratory: Normal respiratory effort. Lungs are clear to auscultation bilaterally.  Gastrointestinal: Soft, non tender, and non distended with positive bowel sounds. No rebound or  guarding. Genitourinary: No CVA tenderness. Musculoskeletal:  No edema, cyanosis, or erythema of extremities. Neurologic: Normal speech and language. Face is symmetric. Moving all extremities. No gross focal neurologic deficits are appreciated. Skin: Skin is warm, dry and intact. No rash noted. Psychiatric: Mood and affect are normal. Speech and behavior are normal.  ____________________________________________   LABS (all labs ordered are listed, but only abnormal results are displayed)  Labs Reviewed  CBC - Abnormal; Notable for the following components:      Result Value   WBC 16.4 (*)    RBC 3.77 (*)    MCH 34.7 (*)    All other components within normal limits  COMPREHENSIVE METABOLIC PANEL - Abnormal; Notable for the following components:   Sodium 128 (*)    Potassium 3.2 (*)    Chloride 75 (*)    CO2 42 (*)    Total Protein 6.0 (*)    Albumin 3.4 (*)    All other components within normal limits  TROPONIN I (HIGH SENSITIVITY) - Abnormal; Notable for the following components:   Troponin I (High Sensitivity) 157 (*)    All other components within normal limits  TROPONIN I (HIGH SENSITIVITY) - Abnormal; Notable for the following components:   Troponin I (High Sensitivity) 158 (*)    All other components within normal limits  RESP PANEL BY RT-PCR (FLU A&B, COVID) [id number]   ____________________________________________  EKG  Sinus rhythm with occasional PACs, rate of 98, first-degree AV block, normal QTC, left axis deviation, LVH with anterior elevations and T wave inversions in the lateral leads.  This is unchanged when compared to baseline. ____________________________________________  RADIOLOGY  I have personally reviewed the images performed during this visit and I agree with the Radiologist's read.   Interpretation by Radiologist:  DG Chest 2 View  Result Date: 09/13/2020 CLINICAL DATA:  Shortness of breath. Patient reports food bolus sensation while eating can  lobe tonight. EXAM: CHEST - 2 VIEW COMPARISON:  Radiograph 09/03/2020, CT 07/30/2020 FINDINGS: Stable heart size and mediastinal contours. Opacity at the left lung base corresponds to large hiatal hernia. Postsurgical change of the left hilum. Postsurgical change in the right upper lobe. Chronic lung disease, bronchiectasis on prior CT. No evidence of acute airspace disease. No pneumothorax. No evidence of pneumomediastinum no significant pleural effusion. Thoracic kyphoplasty at near every level. Bones are diffusely under mineralized remote right rib fractures. IMPRESSION: 1. No acute chest findings. 2. Large hiatal hernia accounting for left basilar opacity. 3. Chronic lung disease, bronchiectasis on prior CT. Electronically Signed   By: Keith Rake M.D.   On: 09/13/2020 21:21   CT Angio Chest PE W and/or Wo Contrast  Result Date: 09/14/2020 CLINICAL DATA:  Shortness of breath. EXAM: CT ANGIOGRAPHY CHEST WITH CONTRAST TECHNIQUE: Multidetector CT imaging of the chest was performed using the standard protocol during bolus administration of intravenous contrast. Multiplanar CT image reconstructions and MIPs were obtained to evaluate the vascular anatomy. CONTRAST:  43m OMNIPAQUE IOHEXOL 350 MG/ML SOLN COMPARISON:  July 30, 2020 FINDINGS: Cardiovascular: There is calcification of the aortic arch. Stable prominence of the central pulmonary arteries is seen. Satisfactory opacification of the pulmonary arteries to the segmental level. No evidence of pulmonary embolism. There is stable cardiomegaly with multi-vessel coronary artery calcification. No pericardial effusion. Mediastinum/Nodes: No enlarged mediastinal, hilar, or axillary lymph nodes. The thyroid gland and trachea demonstrate no significant findings. A large hiatal hernia is again seen with herniation of the contents of the left upper quadrant into the mid and lower left hemithorax. Lungs/Pleura: Postoperative changes are seen consistent with the  patient's history of prior partial right upper lobectomy, right middle lobectomy and left lower lobectomy. Stable areas of scarring and/or atelectasis are seen within the bilateral apices and throughout the periphery of both lungs. Mild, hazy areas of anterior  left upper lobe and posteromedial right upper lobe atelectasis and/or infiltrate are noted. This represents a new finding. Extensive bronchiectasis is seen on the right. There is no evidence of a pleural effusion or pneumothorax. Upper Abdomen: Surgical clips are seen within the gallbladder fossa. Musculoskeletal: Multilevel vertebroplasty is seen extending from the level of T5 through the visualized portion of the lumbar spine. Review of the MIP images confirms the above findings. IMPRESSION: 1. No evidence of pulmonary embolism. 2. Stable postoperative changes consistent with the patient's history of partial right upper lobe, right middle lobe and left lower lobe lobectomy. 3. Diffuse chronic parenchymal lung changes with mild areas of left upper lobe and right lower lobe atelectasis and/or infiltrate. 4. Stable findings consistent with pulmonary artery hypertension. Electronically Signed   By: Virgina Norfolk M.D.   On: 09/14/2020 00:37     ____________________________________________   PROCEDURES  Procedure(s) performed:yes .1-3 Lead EKG Interpretation  Date/Time: 09/13/2020 11:45 PM Performed by: Rudene Re, MD Authorized by: Rudene Re, MD     Interpretation: abnormal     ECG rate assessment: normal     Rhythm: sinus rhythm     Ectopy: PAC     Conduction: abnormal     Critical Care performed: yes  CRITICAL CARE Performed by: Rudene Re  ?  Total critical care time: 30 min  Critical care time was exclusive of separately billable procedures and treating other patients.  Critical care was necessary to treat or prevent imminent or life-threatening deterioration.  Critical care was time spent  personally by me on the following activities: development of treatment plan with patient and/or surrogate as well as nursing, discussions with consultants, evaluation of patient's response to treatment, examination of patient, obtaining history from patient or surrogate, ordering and performing treatments and interventions, ordering and review of laboratory studies, ordering and review of radiographic studies, pulse oximetry and re-evaluation of patient's condition.  ____________________________________________   INITIAL IMPRESSION / ASSESSMENT AND PLAN / ED COURSE  75 y.o. female with a history of tortuous esophagus, recurrent food bolus impactions, hiatal hernia, A. Fib on Eliquis, CAD, CHF, hypertension, chronic hyponatremia, gastric bezoar, bronchiectasis on 2 L nasal cannula who presents for evaluation of food bolus impaction.  Also had some CP earlier today. No CP at this time.  Patient is in no respiratory distress, able to tolerate liquids but no solids.  On her baseline 2 L oxygen.  Patient did complain of chest pain earlier today.  Her troponin here is 157 which seems to be markedly elevated when compared to her baseline.  A repeat troponin is unchanged.  She has no active chest pain at this time.  She has no AKI to justify an elevated troponin.  She has tested positive for COVID 2 months ago and 2 weeks ago therefore she is at higher risk of PE.  She is anticoagulated on Eliquis.  I will go ahead and get a CT angio to rule out a PE causing her elevated troponin at this time. Will keep patient NPO and hold heparin for now anticipating endoscopy in the morning. Will give gentle IV hydration since patient has not eaten or drank since 10AM and looks dry on exam.   _________________________ 1:19 AM on 09/14/2020 ----------------------------------------- Repeat trop unchanged. CTA negative for PE. Will admit to hospitalist service for further evaluation and GI consult in the am. Dr. Allen Norris  consulted.    _____________________________________________ Please note:  Patient was evaluated in Emergency Department today for the symptoms described  in the history of present illness. Patient was evaluated in the context of the global COVID-19 pandemic, which necessitated consideration that the patient might be at risk for infection with the SARS-CoV-2 virus that causes COVID-19. Institutional protocols and algorithms that pertain to the evaluation of patients at risk for COVID-19 are in a state of rapid change based on information released by regulatory bodies including the CDC and federal and state organizations. These policies and algorithms were followed during the patient's care in the ED.  Some ED evaluations and interventions may be delayed as a result of limited staffing during the pandemic.   [place] Controlled Substance Database was reviewed by me. ____________________________________________   FINAL CLINICAL IMPRESSION(S) / ED DIAGNOSES   Final diagnoses:  Esophageal obstruction due to food impaction  Elevated troponin      NEW MEDICATIONS STARTED DURING THIS VISIT:  ED Discharge Orders     None        Note:  This document was prepared using Dragon voice recognition software and may include unintentional dictation errors.    Rudene Re, MD 09/14/20 [phone]

## 2020-09-14 ENCOUNTER — Observation Stay: Payer: Medicare HMO

## 2020-09-14 ENCOUNTER — Ambulatory Visit: Payer: Medicare HMO | Admitting: Family

## 2020-09-14 DIAGNOSIS — K222 Esophageal obstruction: Principal | ICD-10-CM

## 2020-09-14 DIAGNOSIS — K30 Functional dyspepsia: Secondary | ICD-10-CM | POA: Diagnosis not present

## 2020-09-14 DIAGNOSIS — R1310 Dysphagia, unspecified: Secondary | ICD-10-CM | POA: Diagnosis not present

## 2020-09-14 DIAGNOSIS — R778 Other specified abnormalities of plasma proteins: Secondary | ICD-10-CM | POA: Diagnosis not present

## 2020-09-14 DIAGNOSIS — R079 Chest pain, unspecified: Secondary | ICD-10-CM

## 2020-09-14 DIAGNOSIS — E876 Hypokalemia: Secondary | ICD-10-CM | POA: Diagnosis not present

## 2020-09-14 DIAGNOSIS — T18128A Food in esophagus causing other injury, initial encounter: Secondary | ICD-10-CM

## 2020-09-14 DIAGNOSIS — E871 Hypo-osmolality and hyponatremia: Secondary | ICD-10-CM

## 2020-09-14 LAB — BASIC METABOLIC PANEL
Anion gap: 13 (ref 5–15)
BUN: 9 mg/dL (ref 8–23)
CO2: 35 mmol/L — ABNORMAL HIGH (ref 22–32)
Calcium: 8.8 mg/dL — ABNORMAL LOW (ref 8.9–10.3)
Chloride: 82 mmol/L — ABNORMAL LOW (ref 98–111)
Creatinine, Ser: 0.75 mg/dL (ref 0.44–1.00)
GFR, Estimated: >60 mL/min (ref >60)
Glucose, Bld: 71 mg/dL (ref 70–99)
Potassium: 3.3 mmol/L — ABNORMAL LOW (ref 3.5–5.1)
Sodium: 130 mmol/L — ABNORMAL LOW (ref 135–145)

## 2020-09-14 LAB — MAGNESIUM: Magnesium: 2.1 mg/dL (ref 1.7–2.4)

## 2020-09-14 LAB — RESP PANEL BY RT-PCR (FLU A&B, COVID) ARPGX2
Influenza A by PCR: NEGATIVE
Influenza B by PCR: NEGATIVE
SARS Coronavirus 2 by RT PCR: POSITIVE — AB

## 2020-09-14 LAB — CBC
HCT: 35.4 % — ABNORMAL LOW (ref 36.0–46.0)
Hemoglobin: 12.2 g/dL (ref 12.0–15.0)
MCH: 33.5 pg (ref 26.0–34.0)
MCHC: 34.5 g/dL (ref 30.0–36.0)
MCV: 97.3 fL (ref 80.0–100.0)
Platelets: 310 10*3/uL (ref 150–400)
RBC: 3.64 MIL/uL — ABNORMAL LOW (ref 3.87–5.11)
RDW: 13.1 % (ref 11.5–15.5)
WBC: 15.2 10*3/uL — ABNORMAL HIGH (ref 4.0–10.5)
nRBC: 0 % (ref 0.0–0.2)

## 2020-09-14 LAB — TROPONIN I (HIGH SENSITIVITY)
Troponin I (High Sensitivity): 180 ng/L (ref <18)
Troponin I (High Sensitivity): 145 ng/L (ref <18)

## 2020-09-14 MED ORDER — ACETAMINOPHEN 650 MG RE SUPP: 650.0000 mg | Freq: Four times a day (QID) | RECTAL | Status: DC | PRN

## 2020-09-14 MED ORDER — TRAZODONE HCL 50 MG PO TABS: 25.0000 mg | ORAL_TABLET | Freq: Every evening | ORAL | Status: DC | PRN

## 2020-09-14 MED ORDER — ASPIRIN EC 325 MG PO TBEC
325.0000 mg | DELAYED_RELEASE_TABLET | Freq: Every day | ORAL | Status: DC
  Administered 2020-09-15: 325 mg via ORAL
  Filled 2020-09-14: qty 1

## 2020-09-14 MED ORDER — PANTOPRAZOLE SODIUM 40 MG PO TBEC
40.0000 mg | DELAYED_RELEASE_TABLET | Freq: Every day | ORAL | Status: DC
Start: 2020-09-14 — End: 2020-09-15
  Administered 2020-09-15: 40 mg via ORAL
  Filled 2020-09-14: qty 1

## 2020-09-14 MED ORDER — RISAQUAD PO CAPS
1.0000 | ORAL_CAPSULE | Freq: Every day | ORAL | Status: DC
  Administered 2020-09-15: 1 via ORAL
  Filled 2020-09-14: qty 1

## 2020-09-14 MED ORDER — SENNA 8.6 MG PO TABS
1.0000 | ORAL_TABLET | Freq: Every day | ORAL | Status: DC | PRN
Start: 2020-09-14 — End: 2020-09-15

## 2020-09-14 MED ORDER — METOPROLOL SUCCINATE ER 100 MG PO TB24
100.0000 mg | ORAL_TABLET | Freq: Every day | ORAL | Status: DC
  Administered 2020-09-15: 100 mg via ORAL
  Filled 2020-09-14: qty 1

## 2020-09-14 MED ORDER — SODIUM CHLORIDE 7 % IN NEBU: 4.0000 mL | INHALATION_SOLUTION | Freq: Every day | RESPIRATORY_TRACT | Status: DC

## 2020-09-14 MED ORDER — FLEET ENEMA 7-19 GM/118ML RE ENEM
1.0000 | ENEMA | Freq: Once | RECTAL | Status: AC
  Administered 2020-09-14: 1 via RECTAL

## 2020-09-14 MED ORDER — LEVALBUTEROL TARTRATE 45 MCG/ACT IN AERO
1.0000 | INHALATION_SPRAY | Freq: Four times a day (QID) | RESPIRATORY_TRACT | Status: DC | PRN
  Administered 2020-09-15: 1 via RESPIRATORY_TRACT
  Filled 2020-09-14 (×2): qty 15

## 2020-09-14 MED ORDER — LOSARTAN POTASSIUM 50 MG PO TABS
100.0000 mg | ORAL_TABLET | Freq: Every day | ORAL | Status: DC
  Administered 2020-09-15: 100 mg via ORAL
  Filled 2020-09-14: qty 2

## 2020-09-14 MED ORDER — FERROUS SULFATE 325 (65 FE) MG PO TABS
325.0000 mg | ORAL_TABLET | Freq: Every day | ORAL | Status: DC
  Administered 2020-09-15: 325 mg via ORAL
  Filled 2020-09-14: qty 1

## 2020-09-14 MED ORDER — FUROSEMIDE 40 MG PO TABS
40.0000 mg | ORAL_TABLET | Freq: Every day | ORAL | Status: DC
  Administered 2020-09-15: 40 mg via ORAL
  Filled 2020-09-14: qty 1

## 2020-09-14 MED ORDER — BENZONATATE 100 MG PO CAPS: 200.0000 mg | ORAL_CAPSULE | Freq: Three times a day (TID) | ORAL | Status: DC | PRN

## 2020-09-14 MED ORDER — AMIODARONE HCL 200 MG PO TABS
200.0000 mg | ORAL_TABLET | Freq: Two times a day (BID) | ORAL | Status: DC
  Administered 2020-09-14 – 2020-09-15 (×2): 200 mg via ORAL
  Filled 2020-09-14 (×4): qty 1

## 2020-09-14 MED ORDER — MORPHINE SULFATE (PF) 2 MG/ML IV SOLN: 1.0000 mg | INTRAVENOUS | Status: DC | PRN

## 2020-09-14 MED ORDER — SODIUM CHLORIDE 0.9 % IV SOLN: INTRAVENOUS | Status: DC

## 2020-09-14 MED ORDER — ONDANSETRON HCL 4 MG/2ML IJ SOLN
4.0000 mg | Freq: Four times a day (QID) | INTRAMUSCULAR | Status: DC | PRN
  Administered 2020-09-14: 4 mg via INTRAVENOUS
  Filled 2020-09-14: qty 2

## 2020-09-14 MED ORDER — POTASSIUM CHLORIDE CRYS ER 20 MEQ PO TBCR
10.0000 meq | EXTENDED_RELEASE_TABLET | Freq: Every day | ORAL | Status: DC
  Administered 2020-09-15: 10 meq via ORAL
  Filled 2020-09-14: qty 1

## 2020-09-14 MED ORDER — LORAZEPAM 0.5 MG PO TABS
0.5000 mg | ORAL_TABLET | Freq: Every day | ORAL | Status: DC
  Administered 2020-09-14 (×2): 0.5 mg via ORAL
  Filled 2020-09-14 (×2): qty 1

## 2020-09-14 MED ORDER — DIGOXIN 125 MCG PO TABS
125.0000 ug | ORAL_TABLET | Freq: Every day | ORAL | Status: DC
  Administered 2020-09-15: 125 ug via ORAL
  Filled 2020-09-14 (×2): qty 1

## 2020-09-14 MED ORDER — GLYCOPYRROLATE 1 MG PO TABS
1.0000 mg | ORAL_TABLET | Freq: Three times a day (TID) | ORAL | Status: DC
  Administered 2020-09-14 – 2020-09-15 (×2): 1 mg via ORAL
  Filled 2020-09-14 (×7): qty 1

## 2020-09-14 MED ORDER — ACETYLCYSTEINE 20 % IN SOLN
4.0000 mL | Freq: Every day | RESPIRATORY_TRACT | Status: DC
  Filled 2020-09-14 (×2): qty 4

## 2020-09-14 MED ORDER — IPRATROPIUM-ALBUTEROL 20-100 MCG/ACT IN AERS
1.0000 | INHALATION_SPRAY | Freq: Four times a day (QID) | RESPIRATORY_TRACT | Status: DC | PRN
  Administered 2020-09-15: 1 via RESPIRATORY_TRACT
  Filled 2020-09-14: qty 4

## 2020-09-14 MED ORDER — IOHEXOL 300 MG/ML  SOLN
10.0000 mL | Freq: Once | INTRAMUSCULAR | Status: AC | PRN
  Administered 2020-09-14: 10 mL via ORAL

## 2020-09-14 MED ORDER — IPRATROPIUM-ALBUTEROL 20-100 MCG/ACT IN AERS
1.0000 | INHALATION_SPRAY | Freq: Four times a day (QID) | RESPIRATORY_TRACT | Status: DC | PRN
  Filled 2020-09-14: qty 4

## 2020-09-14 MED ORDER — NITROGLYCERIN 0.4 MG SL SUBL: 0.4000 mg | SUBLINGUAL_TABLET | SUBLINGUAL | Status: DC | PRN

## 2020-09-14 MED ORDER — AMLODIPINE BESYLATE 5 MG PO TABS
2.5000 mg | ORAL_TABLET | Freq: Every day | ORAL | Status: DC
  Administered 2020-09-15: 2.5 mg via ORAL
  Filled 2020-09-14: qty 1

## 2020-09-14 MED ORDER — ASCORBIC ACID 500 MG PO TABS
1000.0000 mg | ORAL_TABLET | Freq: Every day | ORAL | Status: DC
  Administered 2020-09-15: 1000 mg via ORAL
  Filled 2020-09-14: qty 2

## 2020-09-14 MED ORDER — IPRATROPIUM-ALBUTEROL 0.5-2.5 (3) MG/3ML IN SOLN
3.0000 mL | RESPIRATORY_TRACT | Status: DC | PRN
  Administered 2020-09-14: 3 mL via RESPIRATORY_TRACT

## 2020-09-14 MED ORDER — ACETYLCYSTEINE 20 % IN SOLN
600.0000 mg | Freq: Every day | RESPIRATORY_TRACT | Status: DC
  Filled 2020-09-14 (×2): qty 4

## 2020-09-14 MED ORDER — POLYETHYLENE GLYCOL 3350 17 G PO PACK: 17.0000 g | PACK | Freq: Every day | ORAL | Status: DC | PRN

## 2020-09-14 MED ORDER — MAGNESIUM HYDROXIDE 400 MG/5ML PO SUSP: 30.0000 mL | Freq: Every day | ORAL | Status: DC | PRN

## 2020-09-14 MED ORDER — APIXABAN 2.5 MG PO TABS
2.5000 mg | ORAL_TABLET | Freq: Two times a day (BID) | ORAL | Status: DC
  Administered 2020-09-14 – 2020-09-15 (×2): 2.5 mg via ORAL
  Filled 2020-09-14 (×4): qty 1

## 2020-09-14 MED ORDER — ENOXAPARIN SODIUM 40 MG/0.4ML IJ SOSY: 40.0000 mg | PREFILLED_SYRINGE | INTRAMUSCULAR | Status: DC

## 2020-09-14 MED ORDER — ONDANSETRON HCL 4 MG PO TABS: 4.0000 mg | ORAL_TABLET | Freq: Four times a day (QID) | ORAL | Status: DC | PRN

## 2020-09-14 MED ORDER — ACETAMINOPHEN 325 MG PO TABS
650.0000 mg | ORAL_TABLET | Freq: Four times a day (QID) | ORAL | Status: DC | PRN
  Administered 2020-09-14: 650 mg via ORAL
  Filled 2020-09-14: qty 2

## 2020-09-14 MED ORDER — GABAPENTIN 300 MG PO CAPS
300.0000 mg | ORAL_CAPSULE | Freq: Every evening | ORAL | Status: DC | PRN
  Administered 2020-09-14: 300 mg via ORAL
  Filled 2020-09-14: qty 1

## 2020-09-14 MED ORDER — IOHEXOL 350 MG/ML SOLN
75.0000 mL | Freq: Once | INTRAVENOUS | Status: AC | PRN
  Administered 2020-09-14: 75 mL via INTRAVENOUS

## 2020-09-14 NOTE — ED Notes (Signed)
Patient reports eating 1 serving of jello, no vomiting, continues to have intermittent belching.

## 2020-09-14 NOTE — ED Notes (Signed)
Pt reports feeling nauseated and states that she is uncertain she can drink enough water to get her pills down

## 2020-09-14 NOTE — Consult Note (Addendum)
[name] , MD [address], [place], [place], [place], [id number] [place], [place], [place], [address] Phone: [phone]  Fax: [phone]  Consultation  Referring Provider:    Dr Reesa Chew Primary Care Physician:  [name], MD Primary Gastroenterologist:  Dr. Vicente Males         Reason for Consultation:     Food impaction  Date of Admission:  09/13/2020 Date of Consultation:  09/14/2020         HPI:   Ana Washington is a 75 y.o. female who has a large hiatal area and is known to me and previously seen at my office back in early 2021.  Endoscopy performed in February 2021 showed a large hiatal hernia she was subsequently followed by Dr. Perrin Maltese discuss about surgery.  She returned 10 days back to the emergency room with dysphagia and was found to have food in the entire esophagus that was taken out.  No clear obstruction was noted but the esophagus was tortuous.  She presented to the emergency room yesterday with difficulty swallowing.  She has a history of bronchiectasis on 2 L of oxygen, atrial fibrillation on Eliquis, CHF, hypertension, positive for COVID 2 weeks back.  In the ER also complained of mild chest pain.  She underwent a CT angiogram PE protocol showed a large hiatal hernia but no clear evidence of food causing obstruction.  Chest x-ray also showed a large hiatal hernia.  Troponin was elevated at 158 10 days back was 50.  CMP showed a low potassium and a hemoglobin of 13.1 g.  I saw her in the emergency room and she had been taking in liquids as well as ice pieces without any problem.  She has been burping a lot.  She does not feel any hunger.  She says she is constipated. Denies any vomiting.  Does not have any difficulty with the liquids of the ice chips going down.  Past Medical History:  Diagnosis Date   Arthritis    Atrial fibrillation ([place])    Bronchiectasis (H[facility])    CAD (coronary artery disease) 07/30/2017   CHF (congestive heart failure) (HCC)     Dumping syndrome    Essential hypertension, malignant 10/03/2013   Family history of adverse reaction to anesthesia    sister [name]   GERD (gastroesophageal reflux disease)    Headache    MIGRAINES   Myocardial infarction ([place]) 2007   Non-STEMI   [name] (postoperative nausea and vomiting)    Psoriasis    PUD (peptic ulcer disease)     Past Surgical History:  Procedure Laterality Date   BACK SURGERY     CHOLECYSTECTOMY     ESOPHAGOGASTRODUODENOSCOPY N/A 09/04/2020   Procedure: ESOPHAGOGASTRODUODENOSCOPY (EGD);  Surgeon: Virgel Manifold, MD;  Location[facility] ENDOSCOPY;  Service: Endoscopy;  Laterality: N/A;   ESOPHAGOGASTRODUODENOSCOPY (EGD) WITH PROPOFOL N/A 03/19/2019   Procedure: ESOPHAGOGASTRODUODENOSCOPY (EGD) WITH PROPOFOL;  Surgeon: [name], MD;  Location: [facility] ENDOSCOPY;  Service: Gastroenterology;  Laterality: N/A;  *Note to anesthesia: Per pt's pulmonologist, if intubating, please extubate to BIPAP.   EYE SURGERY     FOOT SURGERY     INTRAMEDULLARY (IM) NAIL INTERTROCHANTERIC Right 09/30/2018   Procedure: INTRAMEDULLARY (IM) NAIL INTERTROCHANTRIC;  Surgeon: Dereck Leep, MD;  Location: [facility] ORS;  Service: Orthopedics;  Laterality: Right;   KYPHOPLASTY N/A 07/05/2016   Procedure: KYPHOPLASTY T - 9;  Surgeon: Hessie Knows, MD;  Location: [facility] ORS;  Service: Orthopedics;  Laterality: N/A;  KYPHOPLASTY N/A 11/29/2017   Procedure: Iona Hansen;  Surgeon: Hessie Knows, MD;  Location: [facility] ORS;  Service: Orthopedics;  Laterality: N/A;  L2 and L3   KYPHOPLASTY N/A 12/18/2017   Procedure: KYPHOPLASTY L1;  Surgeon: Hessie Knows, MD;  Location: [facility] ORS;  Service: Orthopedics;  Laterality: N/A;   KYPHOPLASTY N/A 01/05/2018   Procedure: KYPHOPLASTY-T11,T12;  Surgeon: Hessie Knows, MD;  Location: [facility] ORS;  Service: Orthopedics;  Laterality: N/A;   KYPHOPLASTY N/A 04/05/2018   Procedure: T10 KYPHOPLASTY;  Surgeon: Hessie Knows, MD;  Location: [facility] ORS;  Service: Orthopedics;   Laterality: N/A;   KYPHOPLASTY N/A 04/12/2018   Procedure: KYPHOPLASTY T7,8;  Surgeon: Hessie Knows, MD;  Location: [facility] ORS;  Service: Orthopedics;  Laterality: N/A;   KYPHOPLASTY N/A 04/19/2018   Procedure: KYPHOPLASTY T5, T6;  Surgeon: Hessie Knows, MD;  Location: [facility] ORS;  Service: Orthopedics;  Laterality: N/A;   LUNG SURGERY  1990 and 1996   THOROCOTOMY WITH LOBECTOMY     LEFT LOWER THORACOTOMY / RIGHT MIDDLE LOBECTOMY    Prior to Admission medications   Medication Sig Start Date End Date Taking? Authorizing Provider  acetaminophen (TYLENOL) 500 MG tablet Take 1,000 mg by mouth every 6 (six) hours as needed for mild pain or headache.    Yes [provider]  acetylcysteine (MUCOMYST) 20 % nebulizer solution Take 4 mLs by nebulization daily. 04/22/20  Yes [provider]  amiodarone (PACERONE) 200 MG tablet Take 1 tablet (200 mg total) by mouth 2 (two) times daily. 08/07/20  Yes Val Riles, MD  amLODipine (NORVASC) 2.5 MG tablet Take 2.5 mg by mouth daily. 08/21/20  Yes [provider]  apixaban (ELIQUIS) 2.5 MG TABS tablet Take 1 tablet (2.5 mg total) by mouth 2 (two) times daily. 08/27/20 09/26/20 Yes Sreenath, Sudheer B, MD  Ascorbic Acid (VITAMIN C) 1000 MG tablet Take 1,000 mg by mouth daily.   Yes [provider]  benzonatate (TESSALON) 200 MG capsule Take 200 mg by mouth 3 (three) times daily as needed. 04/02/20  Yes [provider]  COMBIVENT RESPIMAT 20-100 MCG/ACT AERS respimat 1 puff every 6 (six) hours as needed for wheezing. 06/09/20  Yes [provider]  digoxin (LANOXIN) 0.125 MG tablet Take 125 mcg by mouth daily. 05/11/20  Yes [provider]  ferrous sulfate 325 (65 FE) MG EC tablet Take 325 mg by mouth daily.    Yes [provider]  furosemide (LASIX) 20 MG tablet Take 1 tablet (20 mg total) by mouth daily. Patient taking differently: Take 40 mg by mouth daily. 08/27/20  Yes Sreenath, Sudheer B, MD   gabapentin (NEURONTIN) 300 MG capsule Take 300 mg by mouth at bedtime as needed.   Yes [provider]  glycopyrrolate (ROBINUL) 1 MG tablet Take 1 mg by mouth 3 (three) times daily.   Yes [provider]  [facility]) OINT Apply 1 application topically as needed. 08/17/20  Yes [provider]  levalbuterol (XOPENEX) 0.31 MG/3ML nebulizer solution Inhale 3 mLs into the lungs every 6 (six) hours as needed. 12/10/19  Yes [provider]  levofloxacin (LEVAQUIN) 250 MG tablet Take by mouth. 09/08/20  Yes [provider]  LORazepam (ATIVAN) 0.5 MG tablet Take 1 tablet (0.5 mg total) by mouth at bedtime. 09/10/19  Yes Enzo Bi, MD  losartan (COZAAR) 100 MG tablet Take 100 mg by mouth daily. 08/28/20  Yes [provider]  metoprolol succinate (TOPROL-XL) 100 MG 24 hr tablet Take 100 mg  by mouth daily. 08/31/20  Yes [provider]  NAC 600 MG CAPS Take 600 mg by mouth daily after breakfast.  08/17/19  Yes [provider]  omeprazole (PRILOSEC) 20 MG capsule Take 20 mg by mouth daily. 08/26/19  Yes [provider]  polyethylene glycol (MIRALAX / GLYCOLAX) 17 g packet Take 17 g by mouth daily as needed for severe constipation. 05/02/20  Yes Wieting, Richard, MD  potassium chloride (KLOR-CON) 10 MEQ tablet Take 10 mEq by mouth daily. 08/16/20  Yes [provider]  predniSONE (DELTASONE) 10 MG tablet Take 10 mg by mouth daily. 08/07/20  Yes [provider]  Probiotic Product (ALIGN) 4 MG CAPS Take 4 mg by mouth daily.   Yes [provider]  senna (SENOKOT) 8.6 MG TABS tablet Take 2 tablets (17.2 mg total) by mouth at bedtime. Patient taking differently: Take 1-2 tablets by mouth daily as needed. 05/02/20  Yes Wieting, Richard, MD  Sodium Chloride, Inhalant, 7 % NEBU Inhale 4 mLs into the lungs daily.   Yes [provider]    Family History  Problem Relation Age of Onset    Hypertension Mother    Hypertension Father      Social History   Tobacco Use   Smoking status: Never   Smokeless tobacco: Never  Vaping Use   Vaping Use: Never used  Substance Use Topics   Alcohol use: No   Drug use: No    Allergies as of 09/13/2020 - Review Complete 09/13/2020  Allergen Reaction Noted   Codeine Nausea And Vomiting 06/30/2016   Sulfa antibiotics Diarrhea and Anxiety 03/01/2018   Penicillins Rash 10/03/2013    Review of Systems:    All systems reviewed and negative except where noted in HPI.   Physical Exam:  Vital signs in last 24 hours: Temp:  [98.2 F (36.8 C)] 98.2 F (36.8 C) (08/14 2035) Pulse Rate:  [64-94] 88 (08/15 1300) Resp:  [16-24] 22 (08/15 1300) BP: (104-202)/(70-101) 130/85 (08/15 1300) SpO2:  [84 %-100 %] 100 % (08/15 1300) Weight:  [40 kg] 40 kg (08/14 2033)   General:   Pleasant, cooperative in NAD Head:  Normocephalic and atraumatic. Eyes:   No icterus.   Conjunctiva pink. PERRLA. Ears:  Normal auditory acuity. Neck:  Supple; no masses or thyroidomegaly Lungs: Respirations even and unlabored. Lungs clear to auscultation bilaterally.   No wheezes, crackles, or rhonchi.  Heart: Irregularly irregular without murmur, clicks, rubs or gallops Abdomen:  Soft, nondistended, nontender. Normal bowel sounds. No appreciable masses or hepatomegaly.  No rebound or guarding.  Neurologic:  Alert and oriented x3;  grossly normal neurologically. Skin:  Intact without significant lesions or rashes. Cervical Nodes:  No significant cervical adenopathy. Psych:  Alert and cooperative. Normal affect.  LAB RESULTS: Recent Labs    09/13/20 2045 09/14/20 0911  WBC 16.4* 15.2*  HGB 13.1 12.2  HCT 36.9 35.4*  PLT 340 310   BMET Recent Labs    09/13/20 2045 09/14/20 0911  NA 128* 130*  K 3.2* 3.3*  CL 75* 82*  CO2 42* 35*  GLUCOSE 88 71  BUN 9 9  CREATININE 0.77 0.75  CALCIUM 9.3 8.8*   LFT Recent Labs    09/13/20 2045  PROT 6.0*   ALBUMIN 3.4*  AST 26  ALT 12  ALKPHOS 45  BILITOT 1.0   PT/INR No results for input(s): LABPROT, INR in the last 72 hours.  STUDIES: DG Chest 2 View  Result Date: 09/13/2020 CLINICAL  DATA:  Shortness of breath. Patient reports food bolus sensation while eating can lobe tonight. EXAM: CHEST - 2 VIEW COMPARISON:  Radiograph 09/03/2020, CT 07/30/2020 FINDINGS: Stable heart size and mediastinal contours. Opacity at the left lung base corresponds to large hiatal hernia. Postsurgical change of the left hilum. Postsurgical change in the right upper lobe. Chronic lung disease, bronchiectasis on prior CT. No evidence of acute airspace disease. No pneumothorax. No evidence of pneumomediastinum no significant pleural effusion. Thoracic kyphoplasty at near every level. Bones are diffusely under mineralized remote right rib fractures. IMPRESSION: 1. No acute chest findings. 2. Large hiatal hernia accounting for left basilar opacity. 3. Chronic lung disease, bronchiectasis on prior CT. Electronically Signed   By: Keith Rake M.D.   On: 09/13/2020 21:21   CT Angio Chest PE W and/or Wo Contrast  Result Date: 09/14/2020 CLINICAL DATA:  Shortness of breath. EXAM: CT ANGIOGRAPHY CHEST WITH CONTRAST TECHNIQUE: Multidetector CT imaging of the chest was performed using the standard protocol during bolus administration of intravenous contrast. Multiplanar CT image reconstructions and MIPs were obtained to evaluate the vascular anatomy. CONTRAST:  59m OMNIPAQUE IOHEXOL 350 MG/ML SOLN COMPARISON:  July 30, 2020 FINDINGS: Cardiovascular: There is calcification of the aortic arch. Stable prominence of the central pulmonary arteries is seen. Satisfactory opacification of the pulmonary arteries to the segmental level. No evidence of pulmonary embolism. There is stable cardiomegaly with multi-vessel coronary artery calcification. No pericardial effusion. Mediastinum/Nodes: No enlarged mediastinal, hilar, or axillary lymph  nodes. The thyroid gland and trachea demonstrate no significant findings. A large hiatal hernia is again seen with herniation of the contents of the left upper quadrant into the mid and lower left hemithorax. Lungs/Pleura: Postoperative changes are seen consistent with the patient's history of prior partial right upper lobectomy, right middle lobectomy and left lower lobectomy. Stable areas of scarring and/or atelectasis are seen within the bilateral apices and throughout the periphery of both lungs. Mild, hazy areas of anterior left upper lobe and posteromedial right upper lobe atelectasis and/or infiltrate are noted. This represents a new finding. Extensive bronchiectasis is seen on the right. There is no evidence of a pleural effusion or pneumothorax. Upper Abdomen: Surgical clips are seen within the gallbladder fossa. Musculoskeletal: Multilevel vertebroplasty is seen extending from the level of T5 through the visualized portion of the lumbar spine. Review of the MIP images confirms the above findings. IMPRESSION: 1. No evidence of pulmonary embolism. 2. Stable postoperative changes consistent with the patient's history of partial right upper lobe, right middle lobe and left lower lobe lobectomy. 3. Diffuse chronic parenchymal lung changes with mild areas of left upper lobe and right lower lobe atelectasis and/or infiltrate. 4. Stable findings consistent with pulmonary artery hypertension. Electronically Signed   By: TVirgina NorfolkM.D.   On: 09/14/2020 00:37   DG ESOPHAGUS W SINGLE CM (SOL OR THIN BA)  Result Date: 09/14/2020 CLINICAL DATA:  Dysphagia EXAM: ESOPHOGRAM/BARIUM SWALLOW TECHNIQUE: Single contrast examination was performed using water-soluble contrast. FLUOROSCOPY TIME:  Fluoroscopy Time:  1 minutes 6 seconds Radiation Exposure Index (if provided by the fluoroscopic device): 14.8 mGy Number of Acquired Spot Images: 1 COMPARISON:  Same day CT. FINDINGS: Limited the supine LPO and RPO  esophagram utilizing water-soluble contrast (Omnipaque) demonstrates a large hiatal hernia with an intrathoracic stomach. There multiple filling defects within the esophagus which could represent ingested material. The stomach is also filled with ingested material. Contrast passes through the stomach into small bowel. The study was stopped prematurely  due to patient discomfort. IMPRESSION: Large hiatal hernia with intrathoracic stomach filled with ingested material. Multiple filling defects within the esophagus which could represent ingested material. Contrast passes through the esophagus, into the stomach, and into small bowel. No evidence of high-grade obstruction. Electronically Signed   By: Maurine Simmering M.D.   On: 09/14/2020 09:25      Impression / Plan:   Ana Washington is a 75 y.o. y/o female with a history of a large hiatal hernia, bronchiectasis on oxygen, CHF, CAD on Eliquis admitted with chest pains and possible dysphagia.  CT PE protocol shows no obstruction of the esophagus but patient has symptoms of dysphagia.  Troponin is elevated and for the past 2 weeks  Positive COVID diagnosis likely related to prior positive test few weeks back.  Plan is to consult cardiology.  I have been consulted for dysphagia.  With no clear obstruction seen on the CT scan which is usually pretty sensitive for esophageal obstruction and barium swallow also showing that there is some food debris in the esophagus but no obstruction with the contrast flowing into the large hiatal hernia within her chest cavity.  The stomach contains a lot of food as well.  What I believe is happening is that the stomach has a delayed emptying process due to the large hernia and hence the food is accumulating within the stomach regurgitating into the esophagus.  She is at this point of time tolerating liquids with no regurgitation.    I explained to her at this point of time the risks of an endoscopy procedure outweigh the benefits and  it probably will also not change management.  We will watch her overnight and see how she is feeling in the morning and I hope that her stomach contents emptied out by the morning and she feels much better and able to tolerate more liquids tomorrow.  If she is not then we will have no choice but to perform her endoscopy.  I have discussed the plan with Dr. Reesa Chew In addition she complains of not having a bowel movement for a few days and I will put in an order for a fleets enema.  In the long run I think she would need a more definite plan in terms of her hiatal hernia.  She was seen by general surgery at our hospital who suggested her to be referred to a tertiary center in view of her multiple comorbidities.  Since that would not happen anytime soon I think in the near future she should probably go on a gastroparesis diet with low fat and low residue.  Whenever she feels that the food is sitting in her stomach for long period of time she should probably avoid eating and give her stomach some rest for the contents to empty.   Thank you for involving me in the care of this patient.      LOS: 0 days   [name], MD  09/14/2020, 2:34 PM

## 2020-09-14 NOTE — ED Notes (Signed)
All morning medications held by previous RN d/t patient NPO and nauseated.

## 2020-09-14 NOTE — ED Notes (Signed)
Pt fowlers in bed with family at bedside. NAD. A/ox4, pt states she has to pee emergently. Pt placed on purewick. Pt states she came into the ED for a feeling of food stuck in throat. Pt has similar episode 2 weeks ago and there was removal of food under sedation. Pt speaking in full and complete sentences. Neg drooling or choking. Denies ABD pain, n/v.

## 2020-09-14 NOTE — ED Notes (Signed)
Robin RN aware of assigned bed

## 2020-09-14 NOTE — ED Notes (Signed)
Large amount of dark stool noted post enema.

## 2020-09-14 NOTE — ED Notes (Signed)
Admitting dr at bedside.  

## 2020-09-14 NOTE — Consult Note (Signed)
[facility] Cardiology Consultation Note  Patient ID: Ana Washington, MRN: [id number], DOB/AGE: 1945/02/10 75 y.o. Admit date: 09/13/2020   Date of Consult: 09/14/2020 Primary Physician: [name], MD Primary Cardiologist: fath  Chief Complaint:  Chief Complaint  Patient presents with   food bolus   Reason for Consult:  Chest  HPI: 75 y.o. female with known cardiac vascular disease with minimal coronary atherosclerosis by previous cardiac catheterization showing when she had blockage to go cardiomyopathy.  She was placed on appropriate medication management and also has paroxysmal nonvalvular atrial fibrillation hypertension hyperlipidemia.  These medication management has been reasonable but the patient has had lots of issues with hiatal hernia and chest discomfort likely secondary to that issue.  She has had recent procedures to evaluate and treat this issue but this is not significantly improving her symptoms.  She now comes to the emergency room with further concerns of chest discomfort.  EKG shows normal sinus rhythm left atrial enlargement left axis deviation anterior infarct age undetermined not significantly changed from before.  Troponin level has been 157/158/180/145 more consistent with demand ischemia rather than acute coronary syndrome.  There is no evidence of congestive heart failure at this time and the patient is feeling better with medication management for her hiatal hernia.  The patient has not had any evidence of atrial fibrillation this hospitalization.  Past Medical History:  Diagnosis Date   Arthritis    Atrial fibrillation ([place])    Bronchiectasis (HCC)    CAD (coronary artery disease) 07/30/2017   CHF (congestive heart failure) (HCC)    Dumping syndrome    Essential hypertension, malignant 10/03/2013   Family history of adverse reaction to anesthesia    sister [name]   GERD (gastroesophageal reflux disease)    Headache    MIGRAINES   Myocardial infarction  ([place]) 2007   Non-STEMI   [name] (postoperative nausea and vomiting)    Psoriasis    PUD (peptic ulcer disease)       Surgical History:  Past Surgical History:  Procedure Laterality Date   BACK SURGERY     CHOLECYSTECTOMY     ESOPHAGOGASTRODUODENOSCOPY N/A 09/04/2020   Procedure: ESOPHAGOGASTRODUODENOSCOPY (EGD);  Surgeon: Virgel Manifold, MD;  Location: [facility] ENDOSCOPY;  Service: Endoscopy;  Laterality: N/A;   ESOPHAGOGASTRODUODENOSCOPY (EGD) WITH PROPOFOL N/A 03/19/2019   Procedure: ESOPHAGOGASTRODUODENOSCOPY (EGD) WITH PROPOFOL;  Surgeon: [name], MD;  Location: [facility] ENDOSCOPY;  Service: Gastroenterology;  Laterality: N/A;  *Note to anesthesia: Per pt's pulmonologist, if intubating, please extubate to BIPAP.   EYE SURGERY     FOOT SURGERY     INTRAMEDULLARY (IM) NAIL INTERTROCHANTERIC Right 09/30/2018   Procedure: INTRAMEDULLARY (IM) NAIL INTERTROCHANTRIC;  Surgeon: Dereck Leep, MD;  Location: [facility] ORS;  Service: Orthopedics;  Laterality: Right;   KYPHOPLASTY N/A 07/05/2016   Procedure: KYPHOPLASTY T - 9;  Surgeon: Hessie Knows, MD;  Location: [facility] ORS;  Service: Orthopedics;  Laterality: N/A;   KYPHOPLASTY N/A 11/29/2017   Procedure: Iona Hansen;  Surgeon: Hessie Knows, MD;  Location: [facility] ORS;  Service: Orthopedics;  Laterality: N/A;  L2 and L3   KYPHOPLASTY N/A 12/18/2017   Procedure: KYPHOPLASTY L1;  Surgeon: Hessie Knows, MD;  Location: [facility] ORS;  Service: Orthopedics;  Laterality: N/A;   KYPHOPLASTY N/A 01/05/2018   Procedure: KYPHOPLASTY-T11,T12;  Surgeon: Hessie Knows, MD;  Location: [facility] ORS;  Service: Orthopedics;  Laterality: N/A;   KYPHOPLASTY N/A 04/05/2018   Procedure: T10 KYPHOPLASTY;  Surgeon: Hessie Knows, MD;  Location: [facility] ORS;  Service: Orthopedics;  Laterality: N/A;   KYPHOPLASTY N/A 04/12/2018   Procedure: KYPHOPLASTY T7,8;  Surgeon: Hessie Knows, MD;  Location: [facility] ORS;  Service: Orthopedics;  Laterality: N/A;   KYPHOPLASTY N/A 04/19/2018    Procedure: KYPHOPLASTY T5, T6;  Surgeon: Hessie Knows, MD;  Location: [facility] ORS;  Service: Orthopedics;  Laterality: N/A;   LUNG SURGERY  1990 and 1996   THOROCOTOMY WITH LOBECTOMY     LEFT LOWER THORACOTOMY / RIGHT MIDDLE LOBECTOMY     Home Meds: Prior to Admission medications   Medication Sig Start Date End Date Taking? Authorizing Provider  acetaminophen (TYLENOL) 500 MG tablet Take 1,000 mg by mouth every 6 (six) hours as needed for mild pain or headache.    Yes [provider]  acetylcysteine (MUCOMYST) 20 % nebulizer solution Take 4 mLs by nebulization daily. 04/22/20  Yes [provider]  amiodarone (PACERONE) 200 MG tablet Take 1 tablet (200 mg total) by mouth 2 (two) times daily. 08/07/20  Yes Val Riles, MD  amLODipine (NORVASC) 2.5 MG tablet Take 2.5 mg by mouth daily. 08/21/20  Yes [provider]  apixaban (ELIQUIS) 2.5 MG TABS tablet Take 1 tablet (2.5 mg total) by mouth 2 (two) times daily. 08/27/20 09/26/20 Yes Sreenath, Sudheer B, MD  Ascorbic Acid (VITAMIN C) 1000 MG tablet Take 1,000 mg by mouth daily.   Yes [provider]  benzonatate (TESSALON) 200 MG capsule Take 200 mg by mouth 3 (three) times daily as needed. 04/02/20  Yes [provider]  COMBIVENT RESPIMAT 20-100 MCG/ACT AERS respimat 1 puff every 6 (six) hours as needed for wheezing. 06/09/20  Yes [provider]  digoxin (LANOXIN) 0.125 MG tablet Take 125 mcg by mouth daily. 05/11/20  Yes [provider]  ferrous sulfate 325 (65 FE) MG EC tablet Take 325 mg by mouth daily.    Yes [provider]  furosemide (LASIX) 20 MG tablet Take 1 tablet (20 mg total) by mouth daily. Patient taking differently: Take 40 mg by mouth daily. 08/27/20  Yes Sreenath, Sudheer B, MD  gabapentin (NEURONTIN) 300 MG capsule Take 300 mg by mouth at bedtime as needed.   Yes [provider]  glycopyrrolate (ROBINUL) 1 MG tablet Take 1 mg by mouth 3 (three) times daily.    Yes [provider]  [facility]) OINT Apply 1 application topically as needed. 08/17/20  Yes [provider]  levalbuterol (XOPENEX) 0.31 MG/3ML nebulizer solution Inhale 3 mLs into the lungs every 6 (six) hours as needed. 12/10/19  Yes [provider]  levofloxacin (LEVAQUIN) 250 MG tablet Take by mouth. 09/08/20  Yes [provider]  LORazepam (ATIVAN) 0.5 MG tablet Take 1 tablet (0.5 mg total) by mouth at bedtime. 09/10/19  Yes Enzo Bi, MD  losartan (COZAAR) 100 MG tablet Take 100 mg by mouth daily. 08/28/20  Yes [provider]  metoprolol succinate (TOPROL-XL) 100 MG 24 hr tablet Take 100 mg by mouth daily. 08/31/20  Yes [provider]  NAC 600 MG CAPS Take 600 mg by mouth daily after breakfast.  08/17/19  Yes [provider]  omeprazole (PRILOSEC) 20 MG capsule Take 20 mg by mouth daily. 08/26/19  Yes [provider]  polyethylene glycol (MIRALAX / GLYCOLAX) 17 g packet Take 17 g by mouth daily as needed for severe constipation. 05/02/20  Yes Wieting, Richard, MD  potassium chloride (KLOR-CON) 10 MEQ tablet Take 10 mEq by mouth daily. 08/16/20  Yes [provider]  predniSONE (DELTASONE) 10 MG tablet Take 10 mg by mouth daily. 08/07/20  Yes [provider]  Probiotic Product (ALIGN) 4 MG CAPS Take 4 mg by mouth daily.   Yes [provider]  senna (SENOKOT) 8.6 MG TABS tablet Take 2 tablets (17.2 mg total) by mouth at bedtime. Patient taking differently: Take 1-2 tablets by mouth daily as needed. 05/02/20  Yes Wieting, Richard, MD  Sodium Chloride, Inhalant, 7 % NEBU Inhale 4 mLs into the lungs daily.   Yes [provider]    Inpatient Medications:   acetylcysteine  4 mL Nebulization Daily   acetylcysteine  600 mg Oral QPC breakfast   acidophilus  1 capsule Oral Daily   amiodarone  200 mg Oral BID   amLODipine  2.5 mg Oral Daily   apixaban  2.5 mg Oral BID   vitamin C   1,000 mg Oral Daily   aspirin EC  325 mg Oral Daily   digoxin  125 mcg Oral Daily   ferrous sulfate  325 mg Oral Daily   furosemide  40 mg Oral Daily   glycopyrrolate  1 mg Oral TID   LORazepam  0.5 mg Oral QHS   losartan  100 mg Oral Daily   metoprolol succinate  100 mg Oral Daily   pantoprazole  40 mg Oral Daily   potassium chloride SA  10 mEq Oral Daily   Sodium Chloride (Inhalant)  4 mL Inhalation Daily    sodium chloride 100 mL/hr at 09/14/20 0245   lactated ringers      Allergies:  Allergies  Allergen Reactions   Codeine Nausea And Vomiting   Sulfa Antibiotics Diarrhea and Anxiety    "Makes her feel shaky."   Penicillins Rash    Has patient had a PCN reaction causing immediate rash, facial/tongue/throat swelling, SOB or lightheadedness with hypotension: Unknown Has patient had a PCN reaction causing severe rash involving mucus membranes or skin necrosis: Unknown Has patient had a PCN reaction that required hospitalization: Unknown Has patient had a PCN reaction occurring within the last 10 years: No If all of the above answers are "NO", then may proceed with Cephalosporin use.     Social History   Socioeconomic History   Marital status: Married    Spouse name: Not on file   Number of children: Not on file   Years of education: Not on file   Highest education level: Not on file  Occupational History   Not on file  Tobacco Use   Smoking status: Never   Smokeless tobacco: Never  Vaping Use   Vaping Use: Never used  Substance and Sexual Activity   Alcohol use: No   Drug use: No   Sexual activity: Not on file  Other Topics Concern   Not on file  Social History Narrative   Not on file   Social Determinants of Health   Financial Resource Strain: Not on file  Food Insecurity: Not on file  Transportation Needs: Not on file  Physical Activity: Not on file  Stress: Not on file  Social Connections: Not on file  Intimate Partner Violence: Not on file      Family History  Problem Relation Age of Onset   Hypertension Mother    Hypertension Father      Review of Systems Positive for abdominal discomfort chest discomfort Negative for: General:  chills, fever, night sweats or weight changes.  Cardiovascular: PND orthopnea syncope dizziness  Dermatological skin lesions rashes Respiratory: Cough congestion Urologic:  Frequent urination urination at night and hematuria Abdominal: negative for nausea, vomiting, diarrhea, bright red blood per rectum, melena, or hematemesis Neurologic: negative for visual changes, and/or hearing changes  All other systems reviewed and are otherwise negative except as noted above.  Labs: No results for input(s): CKTOTAL, CKMB, TROPONINI in the last 72 hours. Lab Results  Component Value Date   WBC 15.2 (H) 09/14/2020   HGB 12.2 09/14/2020   HCT 35.4 (L) 09/14/2020   MCV 97.3 09/14/2020   PLT 310 09/14/2020    Recent Labs  Lab 09/13/20 2045 09/14/20 0911  NA 128* 130*  K 3.2* 3.3*  CL 75* 82*  CO2 42* 35*  BUN 9 9  CREATININE 0.77 0.75  CALCIUM 9.3 8.8*  PROT 6.0*  --   BILITOT 1.0  --   ALKPHOS 45  --   ALT 12  --   AST 26  --   GLUCOSE 88 71   No results found for: CHOL, HDL, LDLCALC, TRIG Lab Results  Component Value Date   DDIMER 0.57 (H) 07/14/2020    Radiology/Studies:  DG Chest 2 View  Result Date: 09/13/2020 CLINICAL DATA:  Shortness of breath. Patient reports food bolus sensation while eating can lobe tonight. EXAM: CHEST - 2 VIEW COMPARISON:  Radiograph 09/03/2020, CT 07/30/2020 FINDINGS: Stable heart size and mediastinal contours. Opacity at the left lung base corresponds to large hiatal hernia. Postsurgical change of the left hilum. Postsurgical change in the right upper lobe. Chronic lung disease, bronchiectasis on prior CT. No evidence of acute airspace disease. No pneumothorax. No evidence of pneumomediastinum no significant pleural effusion. Thoracic kyphoplasty at near  every level. Bones are diffusely under mineralized remote right rib fractures. IMPRESSION: 1. No acute chest findings. 2. Large hiatal hernia accounting for left basilar opacity. 3. Chronic lung disease, bronchiectasis on prior CT. Electronically Signed   By: Keith Rake M.D.   On: 09/13/2020 21:21   DG Chest 2 View  Result Date: 09/03/2020 CLINICAL DATA:  Choking EXAM: CHEST - 2 VIEW COMPARISON:  Radiograph 08/23/2020 FINDINGS: Unchanged cardiomediastinal silhouette. Bilateral postoperative changes. There are chronic parenchymal changes. Nodular opacity in the right lower lung, similar to prior exam. Hiatal hernia. Persistent but increased left basilar consolidation. Small bilateral pleural effusions. No visible pneumothorax. Chronic bilateral rib injuries. Unchanged numerous vertebroplasties. IMPRESSION: Persistent but increased left basilar consolidation in comparison to prior exam, could represent atelectasis and known hiatal hernia. Superimposed infection is possible. Small bilateral pleural effusions, unchanged from prior exam. Stable postoperative changes and chronic lung disease bilaterally. Unchanged chronic nodular opacities bilaterally. Electronically Signed   By: Maurine Simmering   On: 09/03/2020 21:04   CT Head Wo Contrast  Result Date: 08/23/2020 CLINICAL DATA:  Headache.  Suspect intracranial hemorrhage. EXAM: CT HEAD WITHOUT CONTRAST TECHNIQUE: Contiguous axial images were obtained from the base of the skull through the vertex without intravenous contrast. COMPARISON:  None. FINDINGS: Brain: No evidence of acute infarction, hemorrhage, hydrocephalus, extra-axial collection or mass lesion/mass effect. Mild diffuse cerebral atrophy. Low-attenuation changes in the deep white matter consistent with small vessel ischemia. Vascular: Moderate intracranial arterial vascular calcifications. Skull: Calvarium appears intact. Sinuses/Orbits: Small retention cyst in the right maxillary antrum. Chronic  appearing deformities of the medial orbital walls, likely congenital. Paranasal sinuses and mastoid air cells are otherwise clear. Other: None. IMPRESSION: No acute intracranial abnormalities. Chronic atrophy and small vessel ischemic changes. Electronically Signed   By: Lucienne Capers M.D.   On: 08/23/2020 21:59  CT Angio Chest PE W and/or Wo Contrast  Result Date: 09/14/2020 CLINICAL DATA:  Shortness of breath. EXAM: CT ANGIOGRAPHY CHEST WITH CONTRAST TECHNIQUE: Multidetector CT imaging of the chest was performed using the standard protocol during bolus administration of intravenous contrast. Multiplanar CT image reconstructions and MIPs were obtained to evaluate the vascular anatomy. CONTRAST:  17m OMNIPAQUE IOHEXOL 350 MG/ML SOLN COMPARISON:  July 30, 2020 FINDINGS: Cardiovascular: There is calcification of the aortic arch. Stable prominence of the central pulmonary arteries is seen. Satisfactory opacification of the pulmonary arteries to the segmental level. No evidence of pulmonary embolism. There is stable cardiomegaly with multi-vessel coronary artery calcification. No pericardial effusion. Mediastinum/Nodes: No enlarged mediastinal, hilar, or axillary lymph nodes. The thyroid gland and trachea demonstrate no significant findings. A large hiatal hernia is again seen with herniation of the contents of the left upper quadrant into the mid and lower left hemithorax. Lungs/Pleura: Postoperative changes are seen consistent with the patient's history of prior partial right upper lobectomy, right middle lobectomy and left lower lobectomy. Stable areas of scarring and/or atelectasis are seen within the bilateral apices and throughout the periphery of both lungs. Mild, hazy areas of anterior left upper lobe and posteromedial right upper lobe atelectasis and/or infiltrate are noted. This represents a new finding. Extensive bronchiectasis is seen on the right. There is no evidence of a pleural effusion or  pneumothorax. Upper Abdomen: Surgical clips are seen within the gallbladder fossa. Musculoskeletal: Multilevel vertebroplasty is seen extending from the level of T5 through the visualized portion of the lumbar spine. Review of the MIP images confirms the above findings. IMPRESSION: 1. No evidence of pulmonary embolism. 2. Stable postoperative changes consistent with the patient's history of partial right upper lobe, right middle lobe and left lower lobe lobectomy. 3. Diffuse chronic parenchymal lung changes with mild areas of left upper lobe and right lower lobe atelectasis and/or infiltrate. 4. Stable findings consistent with pulmonary artery hypertension. Electronically Signed   By: TVirgina NorfolkM.D.   On: 09/14/2020 00:37   DG Chest Port 1 View  Result Date: 08/23/2020 CLINICAL DATA:  Short of breath. EXAM: PORTABLE CHEST 1 VIEW COMPARISON:  08/02/2020 and older exams.  CT, 07/30/2020. FINDINGS: Cardiac silhouette is mostly obscured. There is bi basilar opacity consistent with small effusions. Post lung surgery changes are noted with left perihilar pulmonary anastomosis staples. There are also pulmonary anastomosis staples in the right upper lung extending from the right hilum. Lungs demonstrate prominent vascular and thickened interstitial markings similar to the prior exam. Additional opacity is noted at the left lung base consistent with atelectasis. No pneumothorax. No mediastinal or hilar masses. Numerous vertebral fractures have been treated with vertebroplasty/kyphoplasty, unchanged. IMPRESSION: 1. Findings are similar to the most recent prior chest radiograph. No convincing pneumonia or pulmonary edema. 2. Bilateral lung base opacities consistent with small effusions, greater on the left. Left greater than right basilar atelectasis. 3. Significant chronic interstitial lung disease. Stable changes from prior bilateral lung surgery. Electronically Signed   By: DLajean ManesM.D.   On: 08/23/2020  20:57   DG ESOPHAGUS W SINGLE CM (SOL OR THIN BA)  Result Date: 09/14/2020 CLINICAL DATA:  Dysphagia EXAM: ESOPHOGRAM/BARIUM SWALLOW TECHNIQUE: Single contrast examination was performed using water-soluble contrast. FLUOROSCOPY TIME:  Fluoroscopy Time:  1 minutes 6 seconds Radiation Exposure Index (if provided by the fluoroscopic device): 14.8 mGy Number of Acquired Spot Images: 1 COMPARISON:  Same day CT. FINDINGS: Limited the supine LPO and RPO esophagram utilizing  water-soluble contrast (Omnipaque) demonstrates a large hiatal hernia with an intrathoracic stomach. There multiple filling defects within the esophagus which could represent ingested material. The stomach is also filled with ingested material. Contrast passes through the stomach into small bowel. The study was stopped prematurely due to patient discomfort. IMPRESSION: Large hiatal hernia with intrathoracic stomach filled with ingested material. Multiple filling defects within the esophagus which could represent ingested material. Contrast passes through the esophagus, into the stomach, and into small bowel. No evidence of high-grade obstruction. Electronically Signed   By: Maurine Simmering M.D.   On: 09/14/2020 09:25    EKG: Normal sinus rhythm with left atrial enlargement left axis deviation anterior infarct age undetermined  Weights: Filed Weights   09/13/20 2033  Weight: 40 kg     Physical Exam: Blood pressure 130/85, pulse 88, temperature 98.2 F (36.8 C), temperature source Oral, resp. rate (!) 22, height '5\' 4"'$  (1.626 m), weight 40 kg, SpO2 100 %. Body mass index is 15.14 kg/m. General: Well developed, well nourished, in no acute distress. Head eyes ears nose throat: Normocephalic, atraumatic, sclera non-icteric, no xanthomas, nares are without discharge. No apparent thyromegaly and/or mass  Lungs: Normal respiratory effort.  no wheezes, no rales, no rhonchi.  Heart: RRR with normal S1 S2. no murmur gallop, no rub, PMI is normal  size and placement, carotid upstroke normal without bruit, jugular venous pressure is normal Abdomen: Soft, non-tender, non-distended with normoactive bowel sounds. No hepatomegaly. No rebound/guarding. No obvious abdominal masses. Abdominal aorta is normal size without bruit Extremities: No edema. no cyanosis, no clubbing, no ulcers  Peripheral : 2+ bilateral upper extremity pulses, 2+ bilateral femoral pulses, 2+ bilateral dorsal pedal pulse Neuro: Alert and oriented. No facial asymmetry. No focal deficit. Moves all extremities spontaneously. Musculoskeletal: Normal muscle tone without kyphosis Psych:  Responds to questions appropriately with a normal affect.    Assessment: 75 year old female with hypertension hyperlipidemia diastolic dysfunction heart failure cardiovascular disease having chest pain most consistent with hiatal hernia and gastric issues without evidence of acute coronary syndrome or congestive heart failure  Plan: 1.  Continue supportive care for concerns for hiatal hernia and or stomach discomfort and may proceed to endoscopy for further intervention as necessary due to patient's stability at this time on appropriate medication management. 2.  Continuation of high intensity cholesterol therapy for cardiovascular risk reduction 3.  No change in current medical regimen for maintenance of normal sinus rhythm including amiodarone and digoxin combination 4.  Anticoagulation for further risk reduction and stroke with atrial fibrillation although okay for discontinuation of this medication due to maintaining normal rhythm for other interventions of her hiatal hernia 5.  No further intervention or changes in her medication management for hypertension control 6.  Further treatment options after ambulation and further treatment medication management from the cardiovascular standpoint after intervention with hiatal hernia  Signed, Corey Skains M.D. [facility]  Cardiology 09/14/2020, 1:42 PM

## 2020-09-14 NOTE — ED Notes (Signed)
GI Dr. Vicente Males at bedside, patient reports she ate some ice chips and has had no vomiting.

## 2020-09-14 NOTE — ED Notes (Signed)
Report received from [facility], pt observed resting quietly with her eyes closed, resp even and unlabored, cont to monitor

## 2020-09-14 NOTE — Progress Notes (Signed)
No charge progress note.  Ana Washington is a 75 y.o. 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 fib, as well as recent admission for morphine overdose that was thought to be unintentional/accidental and resolved with Narcan, acute on chronic diastolic CHF and acute exacerbation of bronchiectasis on 7/24-7/28, who presents to the ER with acute onset of dysphagia as well as chest pain and suspected food bolus impaction.  9 days ago she had similar symptoms and underwent an endoscopy showing his tortuous esophagus with food impacted throughout the entire extent of her esophagus..  Since she went home she has not been feeling back to her normal.  She has been having difficulty swallowing solids and this morning was eating A lot but reports that since then she has been unable to swallow any solids.  She is able to have fluids.  In the ED patient was hemodynamically stable, labs with mild hyponatremia, hypochloremia and hypokalemia.  Elevated troponin, COVID PCR came back positive with CT value of 31.6 which was at the upper limit.  Patient is positive for the past 2 months.  No respiratory symptoms so most likely a persistent of infection although CT value is little less than the cutoff point.  Mild leukocytosis.  GI was consulted and they recommend barium swallow which shows some food debris's along the esophageal and large hiatal hernia, contrast went through easily and there was no significant obstruction. They are recommending gastroparesis and softer diet for outpatient surgical evaluation for hiatal hernia  repair.  Cardiology was also consulted and they do not think that she needs any further work-up and elevated troponin is secondary to demand ischemia.  She will continue her home meds and they were recommending adding an anticoagulation for history of paroxysmal atrial fibrillation.  When patient was seen she was having some burping and belching and was very disappointed that why she is not feeling better and she cannot eat much solid food.  No difficulty with liquids. Sister and daughter at bedside.  On exam she was a frail malnourished elderly lady, rest of the exam was benign.  Will continue with current management-appreciate our consultants help. Put her on soft diet as there is no planning for any more EGD by GI.  Most likely be going home tomorrow.

## 2020-09-14 NOTE — H&P (Signed)
[place]   PATIENT NAME: Ana Washington    MR#:  [id number]  DATE OF BIRTH:  15-Oct-1945  DATE OF ADMISSION:  09/13/2020  PRIMARY CARE PHYSICIAN: [name], MD   Patient is coming from: Home  REQUESTING/REFERRING PHYSICIAN: Rudene Re, MD  CHIEF COMPLAINT:   Chief Complaint  Patient presents with   food bolus    HISTORY OF PRESENT ILLNESS:  Ana Washington is a 75 y.o. 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 fib, as well as recent admission for morphine overdose that was thought to be unintentional/accidental and resolved with Narcan, acute on chronic diastolic CHF and acute exacerbation of bronchiectasis on 7/24-7/28, who presents to the ER with acute onset of dysphagia as well as chest pain and suspected food bolus impaction.  9 days ago she had similar symptoms and underwent an endoscopy showing his tortuous esophagus with food impacted throughout the entire extent of her esophagus..  Since she went home she has not been feeling back to her normal.  She has been having difficulty swallowing solids and this morning was eating A lot but reports that since then she has been unable to swallow any solids.  She is able to have fluids with which she hears a lot of bubbling that makes even the fluids hard to go down.  No vomiting though was reported.  She admitted to mild chest pain that lasted a couple minutes and resolved earlier during the day.  It was described as a dull aching pain with no dyspnea or diaphoresis or palpitations.  No recent cough or wheezing or hemoptysis.  No fever or  chills.  No dysuria, oliguria or hematuria or flank pain.  ED Course: Upon presentation to the emergency room, pulse symmetry was 99% on 2 L O2 by nasal cannula with normal vital signs.  BP later was 202/101 and then 143/97.  Labs revealed hyponatremia and hypochloremia with hypokalemia and albumin of 3.4.  High-sensitivity troponin was 157 and later 158 up from 50 on 09/04/2020.  Influenza antigens came negative but COVID-19 PCR was Positive.  Patient has been positive for COVID-19 however over the last couple months since 07/14/2020.  CBC showed leukocytosis of 16.4  EKG as reviewed by me : showed sinus rhythm rate of 95 sinus rhythm with rate of 98 with PACs, possible left atrial enlargement and left axis deviation with LVH and slightly elevated ST segment anteroseptally and laterally that is chronic with T wave inversion laterally and ST segment depression with no significant difference on EKG on 09/03/2020. Imaging: Chest x-ray showed moderate hernia accounting for left basal opacity and chronic lung disease and bronchiectasis that was seen before on prior CT with no acute chest findings. Chest CTA showed the following: 1. No evidence of pulmonary embolism. 2. Stable postoperative changes consistent with the patient's history of partial right upper lobe, right middle lobe and left lower lobe lobectomy. 3. Diffuse chronic parenchymal lung changes with mild areas of left upper lobe and right lower lobe atelectasis and/or infiltrate. 4. Stable findings consistent with pulmonary artery hypertension.  The patient was given a bolus of 500 mill IV lactated Ringer followed 125  mill per hour.  He will be admitted to AN observation progressive unit bed for further evaluation and management. PAST MEDICAL HISTORY:   Past Medical History:  Diagnosis Date   Arthritis    Atrial fibrillation ([place])    Bronchiectasis (HCC)    CAD (coronary artery disease) 07/30/2017   CHF (congestive heart failure) (HCC)     Dumping syndrome    Essential hypertension, malignant 10/03/2013   Family history of adverse reaction to anesthesia    sister [name]   GERD (gastroesophageal reflux disease)    Headache    MIGRAINES   Myocardial infarction ([place]) 2007   Non-STEMI   [name] (postoperative nausea and vomiting)    Psoriasis    PUD (peptic ulcer disease)     PAST SURGICAL HISTORY:   Past Surgical History:  Procedure Laterality Date   BACK SURGERY     CHOLECYSTECTOMY     ESOPHAGOGASTRODUODENOSCOPY N/A 09/04/2020   Procedure: ESOPHAGOGASTRODUODENOSCOPY (EGD);  Surgeon: Virgel Manifold, MD;  Location[facility] ENDOSCOPY;  Service: Endoscopy;  Laterality: N/A;   ESOPHAGOGASTRODUODENOSCOPY (EGD) WITH PROPOFOL N/A 03/19/2019   Procedure: ESOPHAGOGASTRODUODENOSCOPY (EGD) WITH PROPOFOL;  Surgeon: [name], MD;  Location: [facility] ENDOSCOPY;  Service: Gastroenterology;  Laterality: N/A;  *Note to anesthesia: Per pt's pulmonologist, if intubating, please extubate to BIPAP.   EYE SURGERY     FOOT SURGERY     INTRAMEDULLARY (IM) NAIL INTERTROCHANTERIC Right 09/30/2018   Procedure: INTRAMEDULLARY (IM) NAIL INTERTROCHANTRIC;  Surgeon: Dereck Leep, MD;  Location: [facility] ORS;  Service: Orthopedics;  Laterality: Right;   KYPHOPLASTY N/A 07/05/2016   Procedure: KYPHOPLASTY T - 9;  Surgeon: Hessie Knows, MD;  Location: [facility] ORS;  Service: Orthopedics;  Laterality: N/A;   KYPHOPLASTY N/A 11/29/2017   Procedure: Iona Hansen;  Surgeon: Hessie Knows, MD;  Location: [facility] ORS;  Service: Orthopedics;  Laterality: N/A;  L2 and L3   KYPHOPLASTY N/A 12/18/2017   Procedure: KYPHOPLASTY L1;  Surgeon: Hessie Knows, MD;  Location: [facility] ORS;  Service: Orthopedics;  Laterality: N/A;   KYPHOPLASTY N/A 01/05/2018   Procedure: KYPHOPLASTY-T11,T12;  Surgeon: Hessie Knows, MD;  Location: [facility] ORS;  Service: Orthopedics;  Laterality: N/A;   KYPHOPLASTY N/A 04/05/2018   Procedure: T10 KYPHOPLASTY;  Surgeon: Hessie Knows, MD;  Location: [facility]  ORS;  Service: Orthopedics;  Laterality: N/A;   KYPHOPLASTY N/A 04/12/2018   Procedure: KYPHOPLASTY T7,8;  Surgeon: Hessie Knows, MD;  Location: [facility] ORS;  Service: Orthopedics;  Laterality: N/A;   KYPHOPLASTY N/A 04/19/2018   Procedure: KYPHOPLASTY T5, T6;  Surgeon: Hessie Knows, MD;  Location: [facility] ORS;  Service: Orthopedics;  Laterality: N/A;   LUNG SURGERY  1990 and 1996   THOROCOTOMY WITH LOBECTOMY     LEFT LOWER THORACOTOMY / RIGHT MIDDLE LOBECTOMY    SOCIAL HISTORY:   Social History   Tobacco Use   Smoking status: Never   Smokeless tobacco: Never  Substance Use Topics   Alcohol use: No    FAMILY HISTORY:   Family History  Problem Relation Age of Onset   Hypertension Mother    Hypertension Father     DRUG ALLERGIES:   Allergies  Allergen Reactions   Codeine Nausea And Vomiting   Sulfa Antibiotics Diarrhea and Anxiety    "Makes her feel shaky."   Penicillins Rash    Has patient had a PCN reaction causing immediate rash, facial/tongue/throat swelling, SOB or lightheadedness with hypotension: Unknown Has patient had a PCN reaction causing severe rash involving mucus membranes or skin necrosis: Unknown Has  patient had a PCN reaction that required hospitalization: Unknown Has patient had a PCN reaction occurring within the last 10 years: No If all of the above answers are "NO", then may proceed with Cephalosporin use.     REVIEW OF SYSTEMS:   ROS As per history of present illness. All pertinent systems were reviewed above. Constitutional, HEENT, cardiovascular, respiratory, GI, GU, musculoskeletal, neuro, psychiatric, endocrine, integumentary and hematologic systems were reviewed and are otherwise negative/unremarkable except for positive findings mentioned above in the HPI.   MEDICATIONS AT HOME:   Prior to Admission medications   Medication Sig Start Date End Date Taking? Authorizing Provider  acetaminophen (TYLENOL) 500 MG tablet Take 1,000 mg by mouth every  6 (six) hours as needed for mild pain or headache.    Yes [provider]  acetylcysteine (MUCOMYST) 20 % nebulizer solution Take 4 mLs by nebulization daily. 04/22/20  Yes [provider]  amiodarone (PACERONE) 200 MG tablet Take 1 tablet (200 mg total) by mouth 2 (two) times daily. 08/07/20  Yes Val Riles, MD  amLODipine (NORVASC) 2.5 MG tablet Take 2.5 mg by mouth daily. 08/21/20  Yes [provider]  apixaban (ELIQUIS) 2.5 MG TABS tablet Take 1 tablet (2.5 mg total) by mouth 2 (two) times daily. 08/27/20 09/26/20 Yes Sreenath, Sudheer B, MD  Ascorbic Acid (VITAMIN C) 1000 MG tablet Take 1,000 mg by mouth daily.   Yes [provider]  benzonatate (TESSALON) 200 MG capsule Take 200 mg by mouth 3 (three) times daily as needed. 04/02/20  Yes [provider]  COMBIVENT RESPIMAT 20-100 MCG/ACT AERS respimat 1 puff every 6 (six) hours as needed for wheezing. 06/09/20  Yes [provider]  digoxin (LANOXIN) 0.125 MG tablet Take 125 mcg by mouth daily. 05/11/20  Yes [provider]  ferrous sulfate 325 (65 FE) MG EC tablet Take 325 mg by mouth daily.    Yes [provider]  furosemide (LASIX) 20 MG tablet Take 1 tablet (20 mg total) by mouth daily. Patient taking differently: Take 40 mg by mouth daily. 08/27/20  Yes Sreenath, Sudheer B, MD  gabapentin (NEURONTIN) 300 MG capsule Take 300 mg by mouth at bedtime as needed.   Yes [provider]  glycopyrrolate (ROBINUL) 1 MG tablet Take 1 mg by mouth 3 (three) times daily.   Yes [provider]  [facility]) OINT Apply 1 application topically as needed. 08/17/20  Yes [provider]  levalbuterol (XOPENEX) 0.31 MG/3ML nebulizer solution Inhale 3 mLs into the lungs every 6 (six) hours as needed. 12/10/19  Yes [provider]  levofloxacin (LEVAQUIN) 250 MG tablet Take by mouth. 09/08/20  Yes [provider]  LORazepam (ATIVAN) 0.5 MG  tablet Take 1 tablet (0.5 mg total) by mouth at bedtime. 09/10/19  Yes Enzo Bi, MD  losartan (COZAAR) 100 MG tablet Take 100 mg by mouth daily. 08/28/20  Yes [provider]  metoprolol succinate (TOPROL-XL) 100 MG 24 hr tablet Take 100 mg by mouth daily. 08/31/20  Yes [provider]  NAC 600 MG CAPS Take 600 mg by mouth daily after breakfast.  08/17/19  Yes [provider]  omeprazole (PRILOSEC) 20 MG capsule Take 20 mg by mouth daily. 08/26/19  Yes [provider]  polyethylene glycol (MIRALAX / GLYCOLAX) 17 g packet Take 17 g by mouth daily as needed for severe constipation. 05/02/20  Yes Wieting, Richard, MD  potassium chloride (KLOR-CON) 10 MEQ tablet Take 10 mEq by  mouth daily. 08/16/20  Yes [provider]  predniSONE (DELTASONE) 10 MG tablet Take 10 mg by mouth daily. 08/07/20  Yes [provider]  Probiotic Product (ALIGN) 4 MG CAPS Take 4 mg by mouth daily.   Yes [provider]  senna (SENOKOT) 8.6 MG TABS tablet Take 2 tablets (17.2 mg total) by mouth at bedtime. Patient taking differently: Take 1-2 tablets by mouth daily as needed. 05/02/20  Yes Wieting, Richard, MD  Sodium Chloride, Inhalant, 7 % NEBU Inhale 4 mLs into the lungs daily.   Yes [provider]      VITAL SIGNS:  Blood pressure (!) 149/89, pulse 92, temperature 98.2 F (36.8 C), temperature source Oral, resp. rate 18, height _0  (1.626 m), weight 40 kg, SpO2 100 %.  PHYSICAL EXAMINATION:  Physical Exam  GENERAL:  75 y.o.-year-old Caucasian female patient lying in the bed with minimal respiratory distress with mild conversational dyspnea.[name]  EYES: Pupils equal, round, reactive to light and accommodation. No scleral icterus. Extraocular muscles intact.  HEENT: Head atraumatic, normocephalic. Oropharynx and nasopharynx clear.  NECK:  Supple, no jugular venous distention. No thyroid enlargement, no tenderness.  LUNGS: Normal breath sounds bilaterally, no  wheezing, rales,rhonchi or crepitation. No use of accessory muscles of respiration.  CARDIOVASCULAR: Regular rate and rhythm, S1, S2 normal. No murmurs, rubs, or gallops.  ABDOMEN: Soft, nondistended, nontender. Bowel sounds present. No organomegaly or mass.  EXTREMITIES: No pedal edema, cyanosis, or clubbing.  NEUROLOGIC: Cranial nerves II through XII are intact. Muscle strength 5/5 in all extremities. Sensation intact. Gait not checked.  PSYCHIATRIC: The patient is alert and oriented x 3.  Normal affect and good eye contact. SKIN: No obvious rash, lesion, or ulcer.   LABORATORY PANEL:   CBC Recent Labs  Lab 09/13/20 2045  WBC 16.4*  HGB 13.1  HCT 36.9  PLT 340   ------------------------------------------------------------------------------------------------------------------  Chemistries  Recent Labs  Lab 09/13/20 2045  NA 128*  K 3.2*  CL 75*  CO2 42*  GLUCOSE 88  BUN 9  CREATININE 0.77  CALCIUM 9.3  AST 26  ALT 12  ALKPHOS 45  BILITOT 1.0   ------------------------------------------------------------------------------------------------------------------  Cardiac Enzymes No results for input(s): TROPONINI in the last 168 hours. ------------------------------------------------------------------------------------------------------------------  RADIOLOGY:  DG Chest 2 View  Result Date: 09/13/2020 CLINICAL DATA:  Shortness of breath. Patient reports food bolus sensation while eating can lobe tonight. EXAM: CHEST - 2 VIEW COMPARISON:  Radiograph 09/03/2020, CT 07/30/2020 FINDINGS: Stable heart size and mediastinal contours. Opacity at the left lung base corresponds to large hiatal hernia. Postsurgical change of the left hilum. Postsurgical change in the right upper lobe. Chronic lung disease, bronchiectasis on prior CT. No evidence of acute airspace disease. No pneumothorax. No evidence of pneumomediastinum no significant pleural effusion. Thoracic kyphoplasty at near  every level. Bones are diffusely under mineralized remote right rib fractures. IMPRESSION: 1. No acute chest findings. 2. Large hiatal hernia accounting for left basilar opacity. 3. Chronic lung disease, bronchiectasis on prior CT. Electronically Signed   By: Keith Rake M.D.   On: 09/13/2020 21:21   CT Angio Chest PE W and/or Wo Contrast  Result Date: 09/14/2020 CLINICAL DATA:  Shortness of breath. EXAM: CT ANGIOGRAPHY CHEST WITH CONTRAST TECHNIQUE: Multidetector CT imaging of the chest was performed using the standard protocol during bolus administration of intravenous contrast. Multiplanar CT image reconstructions and MIPs were obtained to evaluate the vascular anatomy. CONTRAST:  7m OMNIPAQUE IOHEXOL 350 MG/ML SOLN COMPARISON:  July 30, 2020 FINDINGS: Cardiovascular: There is calcification of the aortic arch. Stable prominence of the central pulmonary arteries is seen. Satisfactory opacification of the pulmonary arteries to the segmental level. No evidence of pulmonary embolism. There is stable cardiomegaly with multi-vessel coronary artery calcification. No pericardial effusion. Mediastinum/Nodes: No enlarged mediastinal, hilar, or axillary lymph nodes. The thyroid gland and trachea demonstrate no significant findings. A large hiatal hernia is again seen with herniation of the contents of the left upper quadrant into the mid and lower left hemithorax. Lungs/Pleura: Postoperative changes are seen consistent with the patient's history of prior partial right upper lobectomy, right middle lobectomy and left lower lobectomy. Stable areas of scarring and/or atelectasis are seen within the bilateral apices and throughout the periphery of both lungs. Mild, hazy areas of anterior left upper lobe and posteromedial right upper lobe atelectasis and/or infiltrate are noted. This represents a new finding. Extensive bronchiectasis is seen on the right. There is no evidence of a pleural effusion or pneumothorax.  Upper Abdomen: Surgical clips are seen within the gallbladder fossa. Musculoskeletal: Multilevel vertebroplasty is seen extending from the level of T5 through the visualized portion of the lumbar spine. Review of the MIP images confirms the above findings. IMPRESSION: 1. No evidence of pulmonary embolism. 2. Stable postoperative changes consistent with the patient's history of partial right upper lobe, right middle lobe and left lower lobe lobectomy. 3. Diffuse chronic parenchymal lung changes with mild areas of left upper lobe and right lower lobe atelectasis and/or infiltrate. 4. Stable findings consistent with pulmonary artery hypertension. Electronically Signed   By: Virgina Norfolk M.D.   On: 09/14/2020 00:37      IMPRESSION AND PLAN:  Active Problems:   Chest pain  1.  Dysphagia with history of tortuous esophagus and concern about recurrence of food bezoire - The patient will be admitted to an observation progressive unit bed. - We will keep n.p.o. and hydrate with IV normal saline. - GI consultation will be obtained. -Dr. Allen Norris will be notified about the patient.  2.  Chest pain with elevated troponin I. - We will follow serial troponin I's. - Cardiology consult will be obtained. - I notified Dr. Clayborn Bigness about the patient - The patient be placed on aspirin. - Given stability of troponin and lack of current chest pain we will holding off on IV heparin at this time.  3.  Hypokalemia. - Potassium replaced and magnesium level will be checked.  4.  Hyponatremia. - The patient will be hydrated with IV normal saline. - Will follow BMP.  5.  Paroxysmal atrial fibrillation. - We will continue amiodarone, digoxin, Cardizem CD and Eliquis.  6.  Essential hypertension. - We will continue Toprol-XL, Cozaar and Cardizem CD.  DVT prophylaxis: Continue Eliquis. Code Status: The patient is DNR/DNI. Family Communication:  The plan of care was discussed in details with the patient (and  family). I answered all questions. The patient agreed to proceed with the above mentioned plan. Further management will depend upon hospital course. Disposition Plan: Back to previous home environment Consults called: Cardiology and gastroenterology. All the records are reviewed and case discussed with ED provider.  Status is: Observation  The patient remains OBS appropriate and will d/c before 2 midnights.  Dispo: The patient is from: Home              Anticipated d/c is to: Home              Patient currently is not medically stable  to d/c.   Difficult to place patient No    TOTAL TIME TAKING CARE OF THIS PATIENT: 55 minutes.    Christel Mormon M.D on 09/14/2020 at 1:56 AM  [facility]   From 7 PM-7 AM, contact night-coverage [email or web address]  CC: Primary care physician; [name], MD

## 2020-09-14 NOTE — ED Notes (Signed)
Pt back from her study, lab at bedside

## 2020-09-14 NOTE — ED Notes (Signed)
Pt taken for her barium test

## 2020-09-15 ENCOUNTER — Ambulatory Visit: Payer: Medicare HMO | Admitting: Family

## 2020-09-15 ENCOUNTER — Encounter: Payer: Self-pay | Admitting: Family Medicine

## 2020-09-15 DIAGNOSIS — K222 Esophageal obstruction: Secondary | ICD-10-CM | POA: Diagnosis not present

## 2020-09-15 DIAGNOSIS — R1310 Dysphagia, unspecified: Secondary | ICD-10-CM | POA: Diagnosis not present

## 2020-09-15 DIAGNOSIS — T18128A Food in esophagus causing other injury, initial encounter: Secondary | ICD-10-CM | POA: Diagnosis not present

## 2020-09-15 DIAGNOSIS — R778 Other specified abnormalities of plasma proteins: Secondary | ICD-10-CM | POA: Diagnosis not present

## 2020-09-15 DIAGNOSIS — K449 Diaphragmatic hernia without obstruction or gangrene: Secondary | ICD-10-CM | POA: Diagnosis not present

## 2020-09-15 MED ORDER — APIXABAN 5 MG PO TABS: 5.0000 mg | ORAL_TABLET | Freq: Two times a day (BID) | ORAL | 1 refills | Status: DC

## 2020-09-15 MED ORDER — APIXABAN 5 MG PO TABS: 5.0000 mg | ORAL_TABLET | Freq: Two times a day (BID) | ORAL | Status: DC

## 2020-09-15 NOTE — Discharge Summary (Signed)
Physician Discharge Summary  Ana Washington ZDG:[id number] DOB: [birth year] DOA: 09/13/2020  PCP: [name], MD  Admit date: 09/13/2020 Discharge date: 09/15/2020  Admitted From: Home Disposition:  Home  Recommendations for Outpatient Follow-up:  Follow up with PCP in 1-2 weeks Follow-up with gastroenterology in 1-2 week Follow-up with cardiology in 1 to 2 weeks Please obtain BMP/CBC in one week Please follow up on the following pending results:None  Home Health:Yes Equipment/Devices: Rolling walker, home oxygen Discharge Condition: Stable CODE STATUS: DNR Diet recommendation: Heart Healthy / Carb Modified   Brief/Interim Summary: Ana Washington is a 75 y.o. 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 fib, as well as recent admission for morphine overdose that was thought to be unintentional/accidental and resolved with Narcan, acute on chronic diastolic CHF and acute exacerbation of bronchiectasis on 7/24-7/28, who presents to the ER with acute onset of dysphagia as well as chest pain and suspected food bolus impaction.  9 days ago she had similar symptoms and underwent an endoscopy showing his tortuous esophagus with food impacted throughout the entire extent of her esophagus..  Since she went home she has not been feeling back to her normal.  She has been having difficulty swallowing solids and this morning was eating A lot but reports that since then she has been unable to swallow any solids.  She is able to have fluids.   In the ED patient was hemodynamically stable, labs with mild hyponatremia,  hypochloremia and hypokalemia.  Elevated troponin, COVID PCR came back positive with CT value of 31.6 which was at the upper limit.  Patient is positive for the past 2 months.  No respiratory symptoms so most likely a persistent of infection although CT value is little less than the cutoff point.  Mild leukocytosis.   GI was consulted and they recommend barium swallow which shows some food debris's along the esophageal and large hiatal hernia, contrast went through easily and there was no significant obstruction. They are recommending gastroparesis diet and outpatient surgical evaluation for hiatal hernia repair.  Cardiology was also consulted and they do not think that she needs any further work-up and elevated troponin is secondary to demand ischemia.  She will continue her home meds and they were recommending adding an anticoagulation for history of paroxysmal atrial fibrillation.  Patient was able to tolerate liquid and soft diet.  And was discharged home with the above-mentioned recommendations.  She will continue with rest of her home medications and follow-up with her providers.  Discharge Diagnoses:  Active Problems:   Dysphagia   Elevated troponin   Chest pain   Discharge Instructions  Discharge Instructions     Diet - low sodium heart healthy   Complete by: As directed    Discharge instructions   Complete by: As directed    It was pleasure taking care of you. Your gastroenterologist thinks that your hiatal hernia is causing some regurgitation.  You also have some slow emptying of stomach, place you soft diet with less fat.  Stay upright while eating and couple of hours after that to prevent regurgitation. Your cardiologist does not think that you need any further work-up at this time  and your elevated enzymes were most likely due to some pressure on your heart.  Please follow-up with them closely for further recommendations. Continue taking your home medications and follow-up  with your provider.   Increase activity slowly   Complete by: As directed       Allergies as of 09/15/2020       Reactions   Codeine Nausea And Vomiting   Sulfa Antibiotics Diarrhea, Anxiety   "Makes her feel shaky."   Penicillins Rash   Has patient had a PCN reaction causing immediate rash, facial/tongue/throat swelling, SOB or lightheadedness with hypotension: Unknown Has patient had a PCN reaction causing severe rash involving mucus membranes or skin necrosis: Unknown Has patient had a PCN reaction that required hospitalization: Unknown Has patient had a PCN reaction occurring within the last 10 years: No If all of the above answers are "NO", then may proceed with Cephalosporin use.        Medication List     STOP taking these medications    levofloxacin 250 MG tablet Commonly known as: LEVAQUIN       TAKE these medications    acetaminophen 500 MG tablet Commonly known as: TYLENOL Take 1,000 mg by mouth every 6 (six) hours as needed for mild pain or headache.   acetylcysteine 20 % nebulizer solution Commonly known as: MUCOMYST Take 4 mLs by nebulization daily.   Align 4 MG Caps Take 4 mg by mouth daily.   amiodarone 200 MG tablet Commonly known as: PACERONE Take 1 tablet (200 mg total) by mouth 2 (two) times daily.   amLODipine 2.5 MG tablet Commonly known as: NORVASC Take 2.5 mg by mouth daily.   apixaban 5 MG Tabs tablet Commonly known as: ELIQUIS Take 1 tablet (5 mg total) by mouth 2 (two) times daily. What changed:  medication strength how much to take   benzonatate 200 MG capsule Commonly known as: TESSALON Take 200 mg by mouth 3 (three) times daily as needed.   Combivent Respimat 20-100 MCG/ACT Aers respimat Generic drug: Ipratropium-Albuterol 1 puff every 6 (six) hours as needed for wheezing.   Dermacloud Oint Apply 1 application topically as needed.   digoxin 0.125 MG tablet Commonly known as: LANOXIN Take 125 mcg by mouth daily.    ferrous sulfate 325 (65 FE) MG EC tablet Take 325 mg by mouth daily.   gabapentin 300 MG capsule Commonly known as: NEURONTIN Take 300 mg by mouth at bedtime as needed.   glycopyrrolate 1 MG tablet Commonly known as: ROBINUL Take 1 mg by mouth 3 (three) times daily.   levalbuterol 0.31 MG/3ML nebulizer solution Commonly known as: XOPENEX Inhale 3 mLs into the lungs every 6 (six) hours as needed.   LORazepam 0.5 MG tablet Commonly known as: ATIVAN Take 1 tablet (0.5 mg total) by mouth at bedtime.   losartan 100 MG tablet Commonly known as: COZAAR Take 100 mg by mouth daily.   metoprolol succinate 100 MG 24 hr tablet Commonly known as: TOPROL-XL Take 100 mg by mouth daily.   NAC 600 MG Caps Generic drug: Acetylcysteine Take 600 mg by mouth daily after breakfast.   omeprazole 20 MG capsule Commonly known as: PRILOSEC Take 20 mg by mouth daily.   polyethylene glycol 17 g packet Commonly known as: MIRALAX / GLYCOLAX Take 17 g by mouth daily as needed for severe constipation.   potassium chloride 10 MEQ tablet Commonly known as: KLOR-CON Take 10 mEq by mouth daily.   predniSONE 10 MG tablet  Commonly known as: DELTASONE Take 10 mg by mouth daily.   Sodium Chloride (Inhalant) 7 % Nebu Inhale 4 mLs into the lungs daily.   vitamin C 1000 MG tablet Take 1,000 mg by mouth daily.       ASK your doctor about these medications    furosemide 20 MG tablet Commonly known as: LASIX Take 1 tablet (20 mg total) by mouth daily.   senna 8.6 MG Tabs tablet Commonly known as: SENOKOT Take 2 tablets (17.2 mg total) by mouth at bedtime.        Follow-up Information     Corey Skains, MD Follow up on 09/24/2020.   Specialty: Cardiology Why: @ 11:15am Contact information: [address] [facility] [place] [address] [phone]         [name], MD. Schedule an appointment as soon as possible for a visit on 09/22/2020.    Specialty: Family Medicine Why: @ 11:15am Contact information: [place] [address] [phone]                Allergies  Allergen Reactions   Codeine Nausea And Vomiting   Sulfa Antibiotics Diarrhea and Anxiety    "Makes her feel shaky."   Penicillins Rash    Has patient had a PCN reaction causing immediate rash, facial/tongue/throat swelling, SOB or lightheadedness with hypotension: Unknown Has patient had a PCN reaction causing severe rash involving mucus membranes or skin necrosis: Unknown Has patient had a PCN reaction that required hospitalization: Unknown Has patient had a PCN reaction occurring within the last 10 years: No If all of the above answers are "NO", then may proceed with Cephalosporin use.     Consultations: Gastroenterology Cardiology  Procedures/Studies: DG Chest 2 View  Result Date: 09/13/2020 CLINICAL DATA:  Shortness of breath. Patient reports food bolus sensation while eating can lobe tonight. EXAM: CHEST - 2 VIEW COMPARISON:  Radiograph 09/03/2020, CT 07/30/2020 FINDINGS: Stable heart size and mediastinal contours. Opacity at the left lung base corresponds to large hiatal hernia. Postsurgical change of the left hilum. Postsurgical change in the right upper lobe. Chronic lung disease, bronchiectasis on prior CT. No evidence of acute airspace disease. No pneumothorax. No evidence of pneumomediastinum no significant pleural effusion. Thoracic kyphoplasty at near every level. Bones are diffusely under mineralized remote right rib fractures. IMPRESSION: 1. No acute chest findings. 2. Large hiatal hernia accounting for left basilar opacity. 3. Chronic lung disease, bronchiectasis on prior CT. Electronically Signed   By: Keith Rake M.D.   On: 09/13/2020 21:21   DG Chest 2 View  Result Date: 09/03/2020 CLINICAL DATA:  Choking EXAM: CHEST - 2 VIEW COMPARISON:  Radiograph 08/23/2020 FINDINGS: Unchanged cardiomediastinal silhouette.  Bilateral postoperative changes. There are chronic parenchymal changes. Nodular opacity in the right lower lung, similar to prior exam. Hiatal hernia. Persistent but increased left basilar consolidation. Small bilateral pleural effusions. No visible pneumothorax. Chronic bilateral rib injuries. Unchanged numerous vertebroplasties. IMPRESSION: Persistent but increased left basilar consolidation in comparison to prior exam, could represent atelectasis and known hiatal hernia. Superimposed infection is possible. Small bilateral pleural effusions, unchanged from prior exam. Stable postoperative changes and chronic lung disease bilaterally. Unchanged chronic nodular opacities bilaterally. Electronically Signed   By: Maurine Simmering   On: 09/03/2020 21:04   CT Head Wo Contrast  Result Date: 08/23/2020 CLINICAL DATA:  Headache.  Suspect intracranial hemorrhage. EXAM: CT HEAD WITHOUT CONTRAST TECHNIQUE: Contiguous axial images were obtained from the base of the  skull through the vertex without intravenous contrast. COMPARISON:  None. FINDINGS: Brain: No evidence of acute infarction, hemorrhage, hydrocephalus, extra-axial collection or mass lesion/mass effect. Mild diffuse cerebral atrophy. Low-attenuation changes in the deep white matter consistent with small vessel ischemia. Vascular: Moderate intracranial arterial vascular calcifications. Skull: Calvarium appears intact. Sinuses/Orbits: Small retention cyst in the right maxillary antrum. 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 Paranasal sinuses and mastoid air cells are otherwise clear. Other: None. IMPRESSION: No acute intracranial abnormalities. Chronic atrophy and small vessel ischemic changes. Electronically Signed   By: Lucienne Capers M.D.   On: 08/23/2020 21:59   CT Angio Chest PE W and/or Wo Contrast  Result Date: 09/14/2020 CLINICAL DATA:  Shortness of breath. EXAM: CT ANGIOGRAPHY CHEST WITH CONTRAST TECHNIQUE:  Multidetector CT imaging of the chest was performed using the standard protocol during bolus administration of intravenous contrast. Multiplanar CT image reconstructions and MIPs were obtained to evaluate the vascular anatomy. CONTRAST:  15m OMNIPAQUE IOHEXOL 350 MG/ML SOLN COMPARISON:  July 30, 2020 FINDINGS: Cardiovascular: There is calcification of the aortic arch. Stable prominence of the central pulmonary arteries is seen. Satisfactory opacification of the pulmonary arteries to the segmental level. No evidence of pulmonary embolism. There is stable cardiomegaly with multi-vessel coronary artery calcification. No pericardial effusion. Mediastinum/Nodes: No enlarged mediastinal, hilar, or axillary lymph nodes. The thyroid gland and trachea demonstrate no significant findings. A large hiatal hernia is again seen with herniation of the contents of the left upper quadrant into the mid and lower left hemithorax. Lungs/Pleura: Postoperative changes are seen consistent with the patient's history of prior partial right upper lobectomy, right middle lobectomy and left lower lobectomy. Stable areas of scarring and/or atelectasis are seen within the bilateral apices and throughout the periphery of both lungs. Mild, hazy areas of anterior left upper lobe and posteromedial right upper lobe atelectasis and/or infiltrate are noted. This represents a new finding. Extensive bronchiectasis is seen on the right. There is no evidence of a pleural effusion or pneumothorax. Upper Abdomen: Surgical clips are seen within the gallbladder fossa. Musculoskeletal: Multilevel vertebroplasty is seen extending from the level of T5 through the visualized portion of the lumbar spine. Review of the MIP images confirms the above findings. IMPRESSION: 1. No evidence of pulmonary embolism. 2. Stable postoperative changes consistent with the patient's history of partial right upper lobe, right middle lobe and left lower lobe lobectomy. 3. Diffuse  chronic parenchymal lung changes with mild areas of left upper lobe and right lower lobe atelectasis and/or infiltrate. 4. Stable findings consistent with pulmonary artery hypertension. Electronically Signed   By: TVirgina NorfolkM.D.   On: 09/14/2020 00:37   DG Chest Port 1 View  Result Date: 08/23/2020 CLINICAL DATA:  Short of breath. EXAM: PORTABLE CHEST 1 VIEW COMPARISON:  08/02/2020 and older exams.  CT, 07/30/2020. FINDINGS: Cardiac silhouette is mostly obscured. There is bi basilar opacity consistent with small effusions. Post lung surgery changes are noted with left perihilar pulmonary anastomosis staples. There are also pulmonary anastomosis staples in the right upper lung extending from the right hilum. Lungs demonstrate prominent vascular and thickened interstitial markings similar to the prior exam. Additional opacity is noted at the left lung base consistent with atelectasis. No pneumothorax. No mediastinal or hilar masses. Numerous vertebral fractures have been treated with vertebroplasty/kyphoplasty, unchanged. IMPRESSION: 1. Findings are similar to the most recent prior chest radiograph. No convincing pneumonia or pulmonary edema. 2. Bilateral lung base opacities consistent with small effusions,  greater on the left. Left greater than right basilar atelectasis. 3. Significant chronic interstitial lung disease. Stable changes from prior bilateral lung surgery. Electronically Signed   By: Lajean Manes M.D.   On: 08/23/2020 20:57   DG ESOPHAGUS W SINGLE CM (SOL OR THIN BA)  Result Date: 09/14/2020 CLINICAL DATA:  Dysphagia EXAM: ESOPHOGRAM/BARIUM SWALLOW TECHNIQUE: Single contrast examination was performed using water-soluble contrast. FLUOROSCOPY TIME:  Fluoroscopy Time:  1 minutes 6 seconds Radiation Exposure Index (if provided by the fluoroscopic device): 14.8 mGy Number of Acquired Spot Images: 1 COMPARISON:  Same day CT. FINDINGS: Limited the supine LPO and RPO esophagram utilizing  water-soluble contrast (Omnipaque) demonstrates a large hiatal hernia with an intrathoracic stomach. There multiple filling defects within the esophagus which could represent ingested material. The stomach is also filled with ingested material. Contrast passes through the stomach into small bowel. The study was stopped prematurely due to patient discomfort. IMPRESSION: Large hiatal hernia with intrathoracic stomach filled with ingested material. Multiple filling defects within the esophagus which could represent ingested material. Contrast passes through the esophagus, into the stomach, and into small bowel. No evidence of high-grade obstruction. Electronically Signed   By: Maurine Simmering M.D.   On: 09/14/2020 09:25    Subjective: Patient was little frustrated that why she cannot eat altogether without having feeling of stickiness in the chest.  We discussed a lot regarding esophageal spasms and a large hiatal hernia causing some food regurgitation along with slow emptying of stomach and how she needs to eat small frequent meals, decrease the content of fat so food will not stay in the stomach for long.  She should be eating liquid/pured diet and follow-up closely with her gastroenterologist for further recommendations. She also needs to see a surgeon as an outpatient for hiatal hernia repair.  Discharge Exam: Vitals:   09/14/20 2036 09/15/20 0829  BP:  (!) 147/91  Pulse:  60  Resp:  18  Temp:  97.7 F (36.5 C)  SpO2: 100% 95%   Vitals:   09/14/20 1700 09/14/20 1918 09/14/20 2036 09/15/20 0829  BP: (!) 154/104 (!) 176/99  (!) 147/91  Pulse: 77 80  60  Resp: _0 Temp:  98.6 F (37 C)  97.7 F (36.5 C)  TempSrc:  Oral    SpO2: 98% 100% 100% 95%  Weight:      Height:        General: Pt is alert, awake, not in acute distress Cardiovascular: RRR, S1/S2 +, no rubs, no gallops Respiratory: CTA bilaterally, no wheezing, no rhonchi Abdominal: Soft, NT, ND, bowel sounds + Extremities: no  edema, no cyanosis   The results of significant diagnostics from this hospitalization (including imaging, microbiology, ancillary and laboratory) are listed below for reference.    Microbiology: Recent Results (from the past 240 hour(s))  Resp Panel by RT-PCR (Flu A&B, Covid) Nasopharyngeal Swab     Status: Abnormal   Collection Time: 09/13/20 11:40 PM   Specimen: Nasopharyngeal Swab; Nasopharyngeal(NP) swabs in vial transport medium  Result Value Ref Range Status   SARS Coronavirus 2 by RT PCR POSITIVE (A) NEGATIVE Final    Comment: RESULT CALLED TO, READ BACK BY AND VERIFIED WITH: SMITH VASSETT AT 0124 ON 09/14/20 BY SS (NOTE) SARS-CoV-2 target nucleic acids are DETECTED.  The SARS-CoV-2 RNA is generally detectable in upper respiratory specimens during the acute phase of infection. Positive results are indicative of the presence of the identified virus, but do not rule out  bacterial infection or co-infection with other pathogens not detected by the test. Clinical correlation with patient history and other diagnostic information is necessary to determine patient infection status. The expected result is Negative.  Fact Sheet for Patients: EntrepreneurPulse.com.au  Fact Sheet for Healthcare Providers: IncredibleEmployment.be  This test is not yet approved or cleared by the [place] FDA and  has been authorized for detection and/or diagnosis of SARS-CoV-2 by FDA under an Emergency Use Authorization (EUA).  This EUA will remain in effect (meaning this test c an be used) for the duration of  the COVID-19 declaration under Section 564(b)(1) of the Act, 21 U.S.C. section 360bbb-3(b)(1), unless the authorization is terminated or revoked sooner.     Influenza A by PCR NEGATIVE NEGATIVE Final   Influenza B by PCR NEGATIVE NEGATIVE Final    Comment: (NOTE) The Xpert Xpress SARS-CoV-2/FLU/RSV plus assay is intended as an aid in the diagnosis of  influenza from Nasopharyngeal swab specimens and should not be used as a sole basis for treatment. Nasal washings and aspirates are unacceptable for Xpert Xpress SARS-CoV-2/FLU/RSV testing.  Fact Sheet for Patients: EntrepreneurPulse.com.au  Fact Sheet for Healthcare Providers: IncredibleEmployment.be  This test is not yet approved or cleared by the [place] FDA and has been authorized for detection and/or diagnosis of SARS-CoV-2 by FDA under an Emergency Use Authorization (EUA). This EUA will remain in effect (meaning this test can be used) for the duration of the COVID-19 declaration under Section 564(b)(1) of the Act, 21 U.S.C. section 360bbb-3(b)(1), unless the authorization is terminated or revoked.  Performed at [facility], [address] [place], [place] [address]      Labs: BNP (last 3 results) Recent Labs    08/23/20 2013 08/25/20 0912 09/08/20 1301  BNP 1,408.1* 1,154.6* 569.7*   Basic Metabolic Panel: Recent Labs  Lab 09/13/20 2045 09/14/20 0911  NA 128* 130*  K 3.2* 3.3*  CL 75* 82*  CO2 42* 35*  GLUCOSE 88 71  BUN 9 9  CREATININE 0.77 0.75  CALCIUM 9.3 8.8*  MG  --  2.1   Liver Function Tests: Recent Labs  Lab 09/13/20 2045  AST 26  ALT 12  ALKPHOS 45  BILITOT 1.0  PROT 6.0*  ALBUMIN 3.4*   No results for input(s): LIPASE, AMYLASE in the last 168 hours. No results for input(s): AMMONIA in the last 168 hours. CBC: Recent Labs  Lab 09/13/20 2045 09/14/20 0911  WBC 16.4* 15.2*  HGB 13.1 12.2  HCT 36.9 35.4*  MCV 97.9 97.3  PLT 340 310   Cardiac Enzymes: No results for input(s): CKTOTAL, CKMB, CKMBINDEX, TROPONINI in the last 168 hours. BNP: Invalid input(s): POCBNP CBG: No results for input(s): GLUCAP in the last 168 hours. D-Dimer No results for input(s): DDIMER in the last 72 hours. Hgb A1c No results for input(s): HGBA1C in the last 72 hours. Lipid Profile No results  for input(s): CHOL, HDL, LDLCALC, TRIG, CHOLHDL, LDLDIRECT in the last 72 hours. Thyroid function studies No results for input(s): TSH, T4TOTAL, T3FREE, THYROIDAB in the last 72 hours.  Invalid input(s): FREET3 Anemia work up No results for input(s): VITAMINB12, FOLATE, FERRITIN, TIBC, IRON, RETICCTPCT in the last 72 hours. Urinalysis    Component Value Date/Time   COLORURINE YELLOW (A) 08/25/2020 1430   APPEARANCEUR HAZY (A) 08/25/2020 1430   LABSPEC 1.008 08/25/2020 1430   PHURINE 9.0 (H) 08/25/2020 1430   GLUCOSEU NEGATIVE 08/25/2020 1430   HGBUR LARGE (A) 08/25/2020 1430   BILIRUBINUR  NEGATIVE 08/25/2020 1430   KETONESUR NEGATIVE 08/25/2020 1430   PROTEINUR NEGATIVE 08/25/2020 1430   NITRITE NEGATIVE 08/25/2020 1430   LEUKOCYTESUR NEGATIVE 08/25/2020 1430   Sepsis Labs Invalid input(s): PROCALCITONIN,  WBC,  LACTICIDVEN Microbiology Recent Results (from the past 240 hour(s))  Resp Panel by RT-PCR (Flu A&B, Covid) Nasopharyngeal Swab     Status: Abnormal   Collection Time: 09/13/20 11:40 PM   Specimen: Nasopharyngeal Swab; Nasopharyngeal(NP) swabs in vial transport medium  Result Value Ref Range Status   SARS Coronavirus 2 by RT PCR POSITIVE (A) NEGATIVE Final    Comment: RESULT CALLED TO, READ BACK BY AND VERIFIED WITH: SMITH VASSETT AT 0124 ON 09/14/20 BY SS (NOTE) SARS-CoV-2 target nucleic acids are DETECTED.  The SARS-CoV-2 RNA is generally detectable in upper respiratory specimens during the acute phase of infection. Positive results are indicative of the presence of the identified virus, but do not rule out bacterial infection or co-infection with other pathogens not detected by the test. Clinical correlation with patient history and other diagnostic information is necessary to determine patient infection status. The expected result is Negative.  Fact Sheet for Patients: EntrepreneurPulse.com.au  Fact Sheet for Healthcare  Providers: IncredibleEmployment.be  This test is not yet approved or cleared by the [place] FDA and  has been authorized for detection and/or diagnosis of SARS-CoV-2 by FDA under an Emergency Use Authorization (EUA).  This EUA will remain in effect (meaning this test c an be used) for the duration of  the COVID-19 declaration under Section 564(b)(1) of the Act, 21 U.S.C. section 360bbb-3(b)(1), unless the authorization is terminated or revoked sooner.     Influenza A by PCR NEGATIVE NEGATIVE Final   Influenza B by PCR NEGATIVE NEGATIVE Final    Comment: (NOTE) The Xpert Xpress SARS-CoV-2/FLU/RSV plus assay is intended as an aid in the diagnosis of influenza from Nasopharyngeal swab specimens and should not be used as a sole basis for treatment. Nasal washings and aspirates are unacceptable for Xpert Xpress SARS-CoV-2/FLU/RSV testing.  Fact Sheet for Patients: EntrepreneurPulse.com.au  Fact Sheet for Healthcare Providers: IncredibleEmployment.be  This test is not yet approved or cleared by the [place] FDA and has been authorized for detection and/or diagnosis of SARS-CoV-2 by FDA under an Emergency Use Authorization (EUA). This EUA will remain in effect (meaning this test can be used) for the duration of the COVID-19 declaration under Section 564(b)(1) of the Act, 21 U.S.C. section 360bbb-3(b)(1), unless the authorization is terminated or revoked.  Performed at [facility], [address] [place], [place] [address]     Time coordinating discharge: Over 30 minutes  SIGNED:  Lorella Nimrod, MD  [facility] 09/15/2020, 2:48 PM  If 7PM-7AM, please contact night-coverage [email or web address]  This record has been created using Systems analyst. Errors have been sought and corrected,but may not always be located. Such creation errors do not reflect on the standard of care.

## 2020-09-15 NOTE — Care Management Obs Status (Signed)
[facility] NOTIFICATION   Patient Details  Name: Ana Washington MRN: [id number] Date of Birth: August 10, 1945   Medicare Observation Status Notification Given:  No (patient discharged and left hospital before letter could be given)    Alberteen Sam, LCSW 09/15/2020, 1:42 PM

## 2020-09-15 NOTE — Evaluation (Signed)
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 fib on Eliquis, bronchiectasis and chronic MAC on home O2 at 3 L, chronic pain on chronic opioids, previously on hospice presents to the ER with acute onset of dysphagia as well as chest pain and suspected food bolus impaction  Clinical Impression  Patient agreeable to PT. Patient reports she has been ambulating limited distance at home due to fatigue and knee buckling. She has near 24 hour caregiver support at baseline.   Patient was able to get out of bed and ambulate a short distance with rolling walker with assistance. Patient has generalized weakness and dyspnea on exertion with minimal activity. Sp02 98% on 2 L 02 during out of bed activity. Patient was pleased that she walked without knee buckling. She will need physical assistance with all mobility at this time for safety and fall prevention.   Anticipate patient will be discharging home soon per notes. Recommend to continue PT to maximize independence and decrease caregiver burden.      Follow Up Recommendations Home health PT;Supervision for mobility/OOB    Equipment Recommendations  Hospital bed (DME in place at home. could possibly benefit from a hospital bed for positioning for comfort with breathing issues.)    Recommendations for Other Services       Precautions / Restrictions Precautions Precautions: Fall Restrictions Weight Bearing Restrictions: No      Mobility  Bed Mobility Overal bed mobility: Needs Assistance Bed Mobility: Supine to Sit;Sit to Supine     Supine to sit: Supervision Sit to supine: Min  guard   General bed mobility comments: increased time required to complete tasks. verbal cues for safety and technique. patient may benfit from a hospital bed at discharge for positioning for comfort with sleeping as she states she sleeps with 3 pillows at home    Transfers Overall transfer level: Needs assistance Equipment used: Rolling walker (2 wheeled) Transfers: Sit to/from Stand Sit to Stand: Min assist;Mod assist         General transfer comment: Min A for standing from the bed, Mod A for standing from toilet. verbal cues for hand placement and technique. patient needs increased support for standing from a lower surface (toilet). recommended to continue using bed side commode at home  Ambulation/Gait Ambulation/Gait assistance: Min guard Gait Distance (Feet): 18 Feet Assistive device: Rolling walker (2 wheeled) Gait Pattern/deviations: Decreased stride length;Narrow base of support;Trunk flexed Gait velocity: decreased   General Gait Details: verbal cues for technique using rolling walker for support. shortness of breath noted with minimal exertio,. Sp02 98% on 2 L02  Stairs            Wheelchair Mobility    Modified Rankin (Stroke Patients Only)       Balance Overall balance assessment: Needs assistance Sitting-balance support: Feet supported Sitting balance-Leahy Scale: Good Sitting balance - Comments: patient with tendency to flex forward for comfort, however no loss of balance noted   Standing balance support: Bilateral upper extremity supported Standing balance-Leahy Scale: Fair Standing balance comment: patient is relying heavily on rolling walker for support  Pertinent Vitals/Pain Pain Assessment: No/denies pain Pain Score: 0-No pain    Home Living Family/patient expects to be discharged to:: Private residence Living Arrangements: Spouse/significant other Available Help at Discharge: Family;Personal care  attendant;Available PRN/intermittently (there is a few hours in the afternoon where patient does not have support, otherwise she has caregivers to assist) Type of Home: House Home Access: Ramped entrance     Home Layout: One level Home Equipment: Walker - 2 wheels;Bedside commode;Tub bench;Wheelchair - Comptroller - 4 wheels Additional Comments: patient on 2 L 02 at home per her report    Prior Function Level of Independence: Needs assistance   Gait / Transfers Assistance Needed: short distance ambulation with rollator and 2 L02, limited distance recently  ADL's / Homemaking Assistance Needed: patient requires assistance with mobility at baseline        Hand Dominance        Extremity/Trunk Assessment   Upper Extremity Assessment Upper Extremity Assessment: Generalized weakness    Lower Extremity Assessment Lower Extremity Assessment: Generalized weakness       Communication      Cognition Arousal/Alertness: Awake/alert Behavior During Therapy: WFL for tasks assessed/performed Overall Cognitive Status: Within Functional Limits for tasks assessed                                        General Comments General comments (skin integrity, edema, etc.): patient educated on energy conservation techniques to be used at home    Exercises     Assessment/Plan    PT Assessment Patient needs continued PT services  PT Problem List Cardiopulmonary status limiting activity;Decreased mobility;Decreased balance;Decreased activity tolerance;Decreased strength       PT Treatment Interventions DME instruction;Gait training;Functional mobility training;Therapeutic activities;Therapeutic exercise;Patient/family education    PT Goals (Current goals can be found in the Care Plan section)  Acute Rehab PT Goals Patient Stated Goal: to go home today PT Goal Formulation: With patient Time For Goal Achievement: 09/29/20 Potential to Achieve Goals:  Fair    Frequency Min 2X/week   Barriers to discharge        Co-evaluation               AM-PAC PT "6 Clicks" Mobility  Outcome Measure Help needed turning from your back to your side while in a flat bed without using bedrails?: None Help needed moving from lying on your back to sitting on the side of a flat bed without using bedrails?: A Little Help needed moving to and from a bed to a chair (including a wheelchair)?: A Little Help needed standing up from a chair using your arms (e.g., wheelchair or bedside chair)?: A Little Help needed to walk in hospital room?: A Little Help needed climbing 3-5 steps with a railing? : A Little 6 Click Score: 19    End of Session Equipment Utilized During Treatment: Gait belt;Oxygen Activity Tolerance: Patient tolerated treatment well Patient left: with call bell/phone within reach;in bed;with bed alarm set Nurse Communication: Mobility status PT Visit Diagnosis: Unsteadiness on feet (R26.81);Muscle weakness (generalized) (M62.81)    Time: 2248-2500 PT Time Calculation (min) (ACUTE ONLY): 40 min   Charges:   PT Evaluation $PT Eval Moderate Complexity: 1 Mod PT Treatments $Gait Training: 8-22 mins $Therapeutic Activity: 8-22 mins      Minna Merritts, PT, MPT   Percell Locus 09/15/2020, 12:02 PM

## 2020-09-15 NOTE — Progress Notes (Signed)
[facility] Encounter Note  Patient: NYHA PADO / Admit Date: 09/13/2020 / Date of Encounter: 09/15/2020, 10:17 AM   Subjective: Overall patient better since admission.  Left-sided evidence of chest discomfort.  Hemodynamically stable.  No evidence of congestive heart failure.  Patient has had telemetry showing normal sinus rhythm.  No evidence of myocardial infarction with troponin levels consistent with demand ischemia at 157/158/180/145.  Patient has had esophageal and stomach contrast assessment showing a very large hiatal hernia likely causing chest discomfort and symptoms. Echocardiogram in 2021 showing normal LV systolic function with ejection fraction of 60% and no evidence of significant valvular heart disease  Review of Systems: Positive for: Stomach pain Negative for: Vision change, hearing change, syncope, dizziness, nausea, vomiting,diarrhea, bloody stool,   cough, congestion, diaphoresis, urinary frequency, urinary pain,skin lesions, skin rashes Others previously listed  Objective: Telemetry: Normal sinus rhythm Physical Exam: Blood pressure (!) 147/91, pulse 60, temperature 97.7 F (36.5 C), resp. rate 18, height '5\' 4"'$  (1.626 m), weight 40 kg, SpO2 95 %. Body mass index is 15.14 kg/m. General: Well developed, well nourished, in no acute distress. Head: Normocephalic, atraumatic, sclera non-icteric, no xanthomas, nares are without discharge. Neck: No apparent masses Lungs: Normal respirations with no wheezes, no rhonchi, no rales , no crackles   Heart: Regular rate and rhythm, normal S1 S2, no murmur, no rub, no gallop, PMI is normal size and placement, carotid upstroke normal without bruit, jugular venous pressure normal Abdomen: Soft, non-tender, non-distended with normoactive bowel sounds. No hepatosplenomegaly. Abdominal aorta is normal size without bruit Extremities: No edema, no clubbing, no cyanosis, no ulcers,  Peripheral: 2+ radial, 2+  femoral, 2+ dorsal pedal pulses Neuro: Alert and oriented. Moves all extremities spontaneously. Psych:  Responds to questions appropriately with a normal affect.   Intake/Output Summary (Last 24 hours) at 09/15/2020 1017 Last data filed at 09/15/2020 0408 Gross per 24 hour  Intake 2016.85 ml  Output 1 ml  Net 2015.85 ml    Inpatient Medications:   acetylcysteine  600 mg Oral QPC breakfast   acidophilus  1 capsule Oral Daily   amiodarone  200 mg Oral BID   amLODipine  2.5 mg Oral Daily   apixaban  2.5 mg Oral BID   vitamin C  1,000 mg Oral Daily   aspirin EC  325 mg Oral Daily   digoxin  125 mcg Oral Daily   ferrous sulfate  325 mg Oral Daily   furosemide  40 mg Oral Daily   glycopyrrolate  1 mg Oral TID   LORazepam  0.5 mg Oral QHS   losartan  100 mg Oral Daily   metoprolol succinate  100 mg Oral Daily   pantoprazole  40 mg Oral Daily   potassium chloride SA  10 mEq Oral Daily   Sodium Chloride (Inhalant)  4 mL Inhalation Daily   Infusions:   lactated ringers      Labs: Recent Labs    09/13/20 2045 09/14/20 0911  NA 128* 130*  K 3.2* 3.3*  CL 75* 82*  CO2 42* 35*  GLUCOSE 88 71  BUN 9 9  CREATININE 0.77 0.75  CALCIUM 9.3 8.8*  MG  --  2.1   Recent Labs    09/13/20 2045  AST 26  ALT 12  ALKPHOS 45  BILITOT 1.0  PROT 6.0*  ALBUMIN 3.4*   Recent Labs    09/13/20 2045 09/14/20 0911  WBC 16.4* 15.2*  HGB 13.1 12.2  HCT 36.9  35.4*  MCV 97.9 97.3  PLT 340 310   No results for input(s): CKTOTAL, CKMB, TROPONINI in the last 72 hours. Invalid input(s): POCBNP No results for input(s): HGBA1C in the last 72 hours.   Weights: Filed Weights   09/13/20 2033  Weight: 40 kg     Radiology/Studies:  DG Chest 2 View  Result Date: 09/13/2020 CLINICAL DATA:  Shortness of breath. Patient reports food bolus sensation while eating can lobe tonight. EXAM: CHEST - 2 VIEW COMPARISON:  Radiograph 09/03/2020, CT 07/30/2020 FINDINGS: Stable heart size and  mediastinal contours. Opacity at the left lung base corresponds to large hiatal hernia. Postsurgical change of the left hilum. Postsurgical change in the right upper lobe. Chronic lung disease, bronchiectasis on prior CT. No evidence of acute airspace disease. No pneumothorax. No evidence of pneumomediastinum no significant pleural effusion. Thoracic kyphoplasty at near every level. Bones are diffusely under mineralized remote right rib fractures. IMPRESSION: 1. No acute chest findings. 2. Large hiatal hernia accounting for left basilar opacity. 3. Chronic lung disease, bronchiectasis on prior CT. Electronically Signed   By: Keith Rake M.D.   On: 09/13/2020 21:21   DG Chest 2 View  Result Date: 09/03/2020 CLINICAL DATA:  Choking EXAM: CHEST - 2 VIEW COMPARISON:  Radiograph 08/23/2020 FINDINGS: Unchanged cardiomediastinal silhouette. Bilateral postoperative changes. There are chronic parenchymal changes. Nodular opacity in the right lower lung, similar to prior exam. Hiatal hernia. Persistent but increased left basilar consolidation. Small bilateral pleural effusions. No visible pneumothorax. Chronic bilateral rib injuries. Unchanged numerous vertebroplasties. IMPRESSION: Persistent but increased left basilar consolidation in comparison to prior exam, could represent atelectasis and known hiatal hernia. Superimposed infection is possible. Small bilateral pleural effusions, unchanged from prior exam. Stable postoperative changes and chronic lung disease bilaterally. Unchanged chronic nodular opacities bilaterally. Electronically Signed   By: Maurine Simmering   On: 09/03/2020 21:04   CT Head Wo Contrast  Result Date: 08/23/2020 CLINICAL DATA:  Headache.  Suspect intracranial hemorrhage. EXAM: CT HEAD WITHOUT CONTRAST TECHNIQUE: Contiguous axial images were obtained from the base of the skull through the vertex without intravenous contrast. COMPARISON:  None. FINDINGS: Brain: No evidence of acute infarction,  hemorrhage, hydrocephalus, extra-axial collection or mass lesion/mass effect. Mild diffuse cerebral atrophy. Low-attenuation changes in the deep white matter consistent with small vessel ischemia. Vascular: Moderate intracranial arterial vascular calcifications. Skull: Calvarium appears intact. Sinuses/Orbits: Small retention cyst in the right maxillary antrum. 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 Paranasal sinuses and mastoid air cells are otherwise clear. Other: None. IMPRESSION: No acute intracranial abnormalities. Chronic atrophy and small vessel ischemic changes. Electronically Signed   By: Lucienne Capers M.D.   On: 08/23/2020 21:59   CT Angio Chest PE W and/or Wo Contrast  Result Date: 09/14/2020 CLINICAL DATA:  Shortness of breath. EXAM: CT ANGIOGRAPHY CHEST WITH CONTRAST TECHNIQUE: Multidetector CT imaging of the chest was performed using the standard protocol during bolus administration of intravenous contrast. Multiplanar CT image reconstructions and MIPs were obtained to evaluate the vascular anatomy. CONTRAST:  66m OMNIPAQUE IOHEXOL 350 MG/ML SOLN COMPARISON:  July 30, 2020 FINDINGS: Cardiovascular: There is calcification of the aortic arch. Stable prominence of the central pulmonary arteries is seen. Satisfactory opacification of the pulmonary arteries to the segmental level. No evidence of pulmonary embolism. There is stable cardiomegaly with multi-vessel coronary artery calcification. No pericardial effusion. Mediastinum/Nodes: No enlarged mediastinal, hilar, or axillary lymph nodes. The thyroid gland and trachea demonstrate no significant findings. A large  hiatal hernia is again seen with herniation of the contents of the left upper quadrant into the mid and lower left hemithorax. Lungs/Pleura: Postoperative changes are seen consistent with the patient's history of prior partial right upper lobectomy, right middle lobectomy and left lower lobectomy.  Stable areas of scarring and/or atelectasis are seen within the bilateral apices and throughout the periphery of both lungs. Mild, hazy areas of anterior left upper lobe and posteromedial right upper lobe atelectasis and/or infiltrate are noted. This represents a new finding. Extensive bronchiectasis is seen on the right. There is no evidence of a pleural effusion or pneumothorax. Upper Abdomen: Surgical clips are seen within the gallbladder fossa. Musculoskeletal: Multilevel vertebroplasty is seen extending from the level of T5 through the visualized portion of the lumbar spine. Review of the MIP images confirms the above findings. IMPRESSION: 1. No evidence of pulmonary embolism. 2. Stable postoperative changes consistent with the patient's history of partial right upper lobe, right middle lobe and left lower lobe lobectomy. 3. Diffuse chronic parenchymal lung changes with mild areas of left upper lobe and right lower lobe atelectasis and/or infiltrate. 4. Stable findings consistent with pulmonary artery hypertension. Electronically Signed   By: Virgina Norfolk M.D.   On: 09/14/2020 00:37   DG Chest Port 1 View  Result Date: 08/23/2020 CLINICAL DATA:  Short of breath. EXAM: PORTABLE CHEST 1 VIEW COMPARISON:  08/02/2020 and older exams.  CT, 07/30/2020. FINDINGS: Cardiac silhouette is mostly obscured. There is bi basilar opacity consistent with small effusions. Post lung surgery changes are noted with left perihilar pulmonary anastomosis staples. There are also pulmonary anastomosis staples in the right upper lung extending from the right hilum. Lungs demonstrate prominent vascular and thickened interstitial markings similar to the prior exam. Additional opacity is noted at the left lung base consistent with atelectasis. No pneumothorax. No mediastinal or hilar masses. Numerous vertebral fractures have been treated with vertebroplasty/kyphoplasty, unchanged. IMPRESSION: 1. Findings are similar to the most  recent prior chest radiograph. No convincing pneumonia or pulmonary edema. 2. Bilateral lung base opacities consistent with small effusions, greater on the left. Left greater than right basilar atelectasis. 3. Significant chronic interstitial lung disease. Stable changes from prior bilateral lung surgery. Electronically Signed   By: Lajean Manes M.D.   On: 08/23/2020 20:57   DG ESOPHAGUS W SINGLE CM (SOL OR THIN BA)  Result Date: 09/14/2020 CLINICAL DATA:  Dysphagia EXAM: ESOPHOGRAM/BARIUM SWALLOW TECHNIQUE: Single contrast examination was performed using water-soluble contrast. FLUOROSCOPY TIME:  Fluoroscopy Time:  1 minutes 6 seconds Radiation Exposure Index (if provided by the fluoroscopic device): 14.8 mGy Number of Acquired Spot Images: 1 COMPARISON:  Same day CT. FINDINGS: Limited the supine LPO and RPO esophagram utilizing water-soluble contrast (Omnipaque) demonstrates a large hiatal hernia with an intrathoracic stomach. There multiple filling defects within the esophagus which could represent ingested material. The stomach is also filled with ingested material. Contrast passes through the stomach into small bowel. The study was stopped prematurely due to patient discomfort. IMPRESSION: Large hiatal hernia with intrathoracic stomach filled with ingested material. Multiple filling defects within the esophagus which could represent ingested material. Contrast passes through the esophagus, into the stomach, and into small bowel. No evidence of high-grade obstruction. Electronically Signed   By: Maurine Simmering M.D.   On: 09/14/2020 09:25     Assessment and Recommendation  75 y.o. female with known history of coronary artery atherosclerosis hypertension paroxysmal nonvalvular atrial fibrillation and abnormal EKG having chest pain most consistent with  hiatal hernia and no current evidence of congestive heart failure or acute coronary syndrome and/or myocardial infarction 1.  Continuation of supportive care  and further treatment options for a hiatal hernia including surgical or interventional treatment as necessary.  Patient is currently at low risk from the cardiovascular standpoint for this procedure 2.  Continuation of digoxin metoprolol combination with amiodarone without change in dosage for maintenance of normal sinus rhythm 3.  Anticoagulation for further risk reduction of stroke with paroxysmal nonvalvular atrial fibrillation with Eliquis without change today.  Patient may have discontinuation of Eliquis and aspirin if needed to reduce bleeding complications if's procedure or intervention contemplated 4.  Hypertension controlled with amlodipine metoprolol losartan combination without change today 5.  No further cardiac diagnostics necessary at this time 6.  Okay for discharge home from cardiac standpoint if ambulating well with no evidence of significant symptoms.  Please call if any further questions  Signed, Serafina Royals M.D. FACC

## 2020-09-15 NOTE — Progress Notes (Signed)
Patient alert and oriented. No complaints of pain. No change in previous assessment. Patient being discharged home today.

## 2020-09-15 NOTE — Progress Notes (Signed)
Ana Washington , MD [address] [place], [place], [place], [address] [address] [place], [place], [place], [address] Phone: [phone]  Fax: [phone]   Ana Washington is being followed for dysphagia  Day 1 of follow up   Subjective: She is doing better.  Had a big bowel movement after the enema.  Tolerated grits, coffee, carbonated beverages this morning no vomiting.  She does have belching.   Objective: Vital signs in last 24 hours: Vitals:   09/14/20 1700 09/14/20 1918 09/14/20 2036 09/15/20 0829  BP: (!) 154/104 (!) 176/99  (!) 147/91  Pulse: 77 80  60  Resp: '20 20  18  '$ Temp:  98.6 F (37 C)  97.7 F (36.5 C)  TempSrc:  Oral    SpO2: 98% 100% 100% 95%  Weight:      Height:       Weight change:   Intake/Output Summary (Last 24 hours) at 09/15/2020 1036 Last data filed at 09/15/2020 0408 Gross per 24 hour  Intake 2016.85 ml  Output 1 ml  Net 2015.85 ml     Exam:  Abdomen: soft, nontender, normal bowel sounds   Lab Results: '@LABTEST2'$ @ Micro Results: Recent Results (from the past 240 hour(s))  Resp Panel by RT-PCR (Flu A&B, Covid) Nasopharyngeal Swab     Status: Abnormal   Collection Time: 09/13/20 11:40 PM   Specimen: Nasopharyngeal Swab; Nasopharyngeal(NP) swabs in vial transport medium  Result Value Ref Range Status   SARS Coronavirus 2 by RT PCR POSITIVE (A) NEGATIVE Final    Comment: RESULT CALLED TO, READ BACK BY AND VERIFIED WITH: SMITH VASSETT AT 0124 ON 09/14/20 BY SS (NOTE) SARS-CoV-2 target nucleic acids are DETECTED.  The SARS-CoV-2 RNA is generally detectable in upper respiratory specimens during the acute phase of infection. Positive results are indicative of the presence of the identified virus, but do not rule out bacterial infection or co-infection with other pathogens not detected by the test. Clinical correlation with patient history and other diagnostic information is necessary to determine patient infection status. The  expected result is Negative.  Fact Sheet for Patients: EntrepreneurPulse.com.au  Fact Sheet for Healthcare Providers: IncredibleEmployment.be  This test is not yet approved or cleared by the [place] FDA and  has been authorized for detection and/or diagnosis of SARS-CoV-2 by FDA under an Emergency Use Authorization (EUA).  This EUA will remain in effect (meaning this test c an be used) for the duration of  the COVID-19 declaration under Section 564(b)(1) of the Act, 21 U.S.C. section 360bbb-3(b)(1), unless the authorization is terminated or revoked sooner.     Influenza A by PCR NEGATIVE NEGATIVE Final   Influenza B by PCR NEGATIVE NEGATIVE Final    Comment: (NOTE) The Xpert Xpress SARS-CoV-2/FLU/RSV plus assay is intended as an aid in the diagnosis of influenza from Nasopharyngeal swab specimens and should not be used as a sole basis for treatment. Nasal washings and aspirates are unacceptable for Xpert Xpress SARS-CoV-2/FLU/RSV testing.  Fact Sheet for Patients: EntrepreneurPulse.com.au  Fact Sheet for Healthcare Providers: IncredibleEmployment.be  This test is not yet approved or cleared by the [place] FDA and has been authorized for detection and/or diagnosis of SARS-CoV-2 by FDA under an Emergency Use Authorization (EUA). This EUA will remain in effect (meaning this test can be used) for the duration of the COVID-19 declaration under Section 564(b)(1) of the Act, 21 U.S.C. section 360bbb-3(b)(1), unless the authorization is terminated or revoked.  Performed at [facility],  [place],  [address]    Studies/Results: DG Chest 2 View  Result Date: 09/13/2020 CLINICAL DATA:  Shortness of breath. Patient reports food bolus sensation while eating can lobe tonight. EXAM: CHEST - 2 VIEW COMPARISON:  Radiograph 09/03/2020, CT 07/30/2020 FINDINGS: Stable heart  size and mediastinal contours. Opacity at the left lung base corresponds to large hiatal hernia. Postsurgical change of the left hilum. Postsurgical change in the right upper lobe. Chronic lung disease, bronchiectasis on prior CT. No evidence of acute airspace disease. No pneumothorax. No evidence of pneumomediastinum no significant pleural effusion. Thoracic kyphoplasty at near every level. Bones are diffusely under mineralized remote right rib fractures. IMPRESSION: 1. No acute chest findings. 2. Large hiatal hernia accounting for left basilar opacity. 3. Chronic lung disease, bronchiectasis on prior CT. Electronically Signed   By: Keith Rake M.D.   On: 09/13/2020 21:21   CT Angio Chest PE W and/or Wo Contrast  Result Date: 09/14/2020 CLINICAL DATA:  Shortness of breath. EXAM: CT ANGIOGRAPHY CHEST WITH CONTRAST TECHNIQUE: Multidetector CT imaging of the chest was performed using the standard protocol during bolus administration of intravenous contrast. Multiplanar CT image reconstructions and MIPs were obtained to evaluate the vascular anatomy. CONTRAST:  44m OMNIPAQUE IOHEXOL 350 MG/ML SOLN COMPARISON:  July 30, 2020 FINDINGS: Cardiovascular: There is calcification of the aortic arch. Stable prominence of the central pulmonary arteries is seen. Satisfactory opacification of the pulmonary arteries to the segmental level. No evidence of pulmonary embolism. There is stable cardiomegaly with multi-vessel coronary artery calcification. No pericardial effusion. Mediastinum/Nodes: No enlarged mediastinal, hilar, or axillary lymph nodes. The thyroid gland and trachea demonstrate no significant findings. A large hiatal hernia is again seen with herniation of the contents of the left upper quadrant into the mid and lower left hemithorax. Lungs/Pleura: Postoperative changes are seen consistent with the patient's history of prior partial right upper lobectomy, right middle lobectomy and left lower lobectomy.  Stable areas of scarring and/or atelectasis are seen within the bilateral apices and throughout the periphery of both lungs. Mild, hazy areas of anterior left upper lobe and posteromedial right upper lobe atelectasis and/or infiltrate are noted. This represents a new finding. Extensive bronchiectasis is seen on the right. There is no evidence of a pleural effusion or pneumothorax. Upper Abdomen: Surgical clips are seen within the gallbladder fossa. Musculoskeletal: Multilevel vertebroplasty is seen extending from the level of T5 through the visualized portion of the lumbar spine. Review of the MIP images confirms the above findings. IMPRESSION: 1. No evidence of pulmonary embolism. 2. Stable postoperative changes consistent with the patient's history of partial right upper lobe, right middle lobe and left lower lobe lobectomy. 3. Diffuse chronic parenchymal lung changes with mild areas of left upper lobe and right lower lobe atelectasis and/or infiltrate. 4. Stable findings consistent with pulmonary artery hypertension. Electronically Signed   By: TVirgina NorfolkM.D.   On: 09/14/2020 00:37   DG ESOPHAGUS W SINGLE CM (SOL OR THIN BA)  Result Date: 09/14/2020 CLINICAL DATA:  Dysphagia EXAM: ESOPHOGRAM/BARIUM SWALLOW TECHNIQUE: Single contrast examination was performed using water-soluble contrast. FLUOROSCOPY TIME:  Fluoroscopy Time:  1 minutes 6 seconds Radiation Exposure Index (if provided by the fluoroscopic device): 14.8 mGy Number of Acquired Spot Images: 1 COMPARISON:  Same day CT. FINDINGS: Limited the supine LPO and RPO esophagram utilizing water-soluble contrast (Omnipaque) demonstrates a large hiatal hernia with an intrathoracic stomach. There multiple filling defects within the esophagus which could represent ingested material. The  stomach is also filled with ingested material. Contrast passes through the stomach into small bowel. The study was stopped prematurely due to patient discomfort.  IMPRESSION: Large hiatal hernia with intrathoracic stomach filled with ingested material. Multiple filling defects within the esophagus which could represent ingested material. Contrast passes through the esophagus, into the stomach, and into small bowel. No evidence of high-grade obstruction. Electronically Signed   By: Maurine Simmering M.D.   On: 09/14/2020 09:25   Medications: I have reviewed the patient's current medications. Scheduled Meds:  acetylcysteine  600 mg Oral QPC breakfast   acidophilus  1 capsule Oral Daily   amiodarone  200 mg Oral BID   amLODipine  2.5 mg Oral Daily   apixaban  2.5 mg Oral BID   vitamin C  1,000 mg Oral Daily   aspirin EC  325 mg Oral Daily   digoxin  125 mcg Oral Daily   ferrous sulfate  325 mg Oral Daily   furosemide  40 mg Oral Daily   glycopyrrolate  1 mg Oral TID   LORazepam  0.5 mg Oral QHS   losartan  100 mg Oral Daily   metoprolol succinate  100 mg Oral Daily   pantoprazole  40 mg Oral Daily   potassium chloride SA  10 mEq Oral Daily   Sodium Chloride (Inhalant)  4 mL Inhalation Daily   Continuous Infusions:  lactated ringers     PRN Meds:.acetaminophen **OR** acetaminophen, benzonatate, gabapentin, Ipratropium-Albuterol, ipratropium-albuterol, levalbuterol, magnesium hydroxide, morphine injection, nitroGLYCERIN, ondansetron **OR** ondansetron (ZOFRAN) IV, polyethylene glycol, senna, traZODone   Assessment: Active Problems:   Dysphagia   Elevated troponin   Chest pain  Ana Washington is a 75 y.o. y/o female with a history of a large hiatal hernia, bronchiectasis on oxygen, CHF, CAD on Eliquis admitted with chest pains and possible dysphagia.  CT PE protocol shows no obstruction of the esophagus but patient has symptoms of dysphagia.Barium swallow shows no obstruction.   Troponin is elevated and for the past 2 weeks. Likely has chronic reflux from large hiatal hernia.  The food in her esophagus is likely reflux which contains food material from  her hiatal hernia.  She also probably has delayed gastric emptying causing a large quantity of food to stay in her stomach for prolonged periods of time.  Apart from surgery which would be high risk to fix her hiatal hernia and has to be done at a tertiary center, the only other medical management we could offer would be a gastroparesis diet and as needed Reglan if okay from the cardiac standpoint.  No endoscopy required at this point of time as she does not demonstrate any features of obstruction   I will sign off.  Please call me if any further GI concerns or questions.  We would like to thank you for the opportunity to participate in the care of EMI VELASQUES.    LOS: 0 days   Ana Bellows, MD 09/15/2020, 10:36 AM

## 2020-09-15 NOTE — Plan of Care (Signed)

## 2020-09-15 NOTE — Progress Notes (Signed)
Patient to dc home today, active with [facility]. CSW Requested orders from MD for [facility] PT, RN and Aide. [place] with [facility] informed of dc today.   CSW attempted to reach patient for obs letter, patient has already discharged and left the hospital.   [place], LCSW [phone]

## 2020-09-16 ENCOUNTER — Other Ambulatory Visit (HOSPITAL_COMMUNITY): Payer: Self-pay

## 2020-09-16 ENCOUNTER — Other Ambulatory Visit: Payer: Self-pay | Admitting: Family

## 2020-09-16 ENCOUNTER — Encounter (HOSPITAL_COMMUNITY): Payer: Self-pay

## 2020-09-16 MED ORDER — FUROSEMIDE 20 MG PO TABS: 20.0000 mg | ORAL_TABLET | Freq: Every day | ORAL | 0 refills | Status: AC | PRN

## 2020-09-16 NOTE — Progress Notes (Signed)
Today had a home visit with Ana Washington.  She was discharged from hospital yesterday.  Only med change I could see was stop levaquin, no new meds.  On her discharge states to consult Dr about how to take furosemide.  Contacted [name] and she advised with her weight she can stop the furosemide and take with weight gain of 2 lbs overnight or 5 lbs in a week.  Advised her and made adjustment to her med box.  She appears to understand and I made her caregiver aware also.  She has most of her meds, she will be out of amiodarone starting on Friday and she is out of metoprolol.  Contacted her pharmacy and her caregiver will pick them up tomorrow. Her nurse was there while I was visiting.  Her heart rate is irregular and a little low for her, she is consulting with Dr Nehemiah Massed.  She states she is weak but feels ok.  She is not anxious today.  She is eating a soft diet, appetite is her normal.  She is not a big eater.  She has a caregiver 24 hrs a day, lives with her husband that has health problems also.  She appears in a good mood.  Edema in down in her legs and ankles today, crackles in all fields, could be her normal.  Sats are good, she denies increased shortness of breath.  She denies any chest pain, headaches or dizziness.  Will continue to visit for heart failure, diet and medication compliance.   [place] [phone]

## 2020-09-21 ENCOUNTER — Ambulatory Visit (INDEPENDENT_AMBULATORY_CARE_PROVIDER_SITE_OTHER): Payer: Medicare HMO | Admitting: Surgery

## 2020-09-21 ENCOUNTER — Other Ambulatory Visit: Payer: Self-pay

## 2020-09-21 ENCOUNTER — Encounter: Payer: Self-pay | Admitting: Surgery

## 2020-09-21 VITALS — BP 159/88 | HR 60 | Temp 98.0°F | Ht 64.0 in | Wt 99.0 lb

## 2020-09-21 DIAGNOSIS — K449 Diaphragmatic hernia without obstruction or gangrene: Secondary | ICD-10-CM | POA: Diagnosis not present

## 2020-09-21 NOTE — Patient Instructions (Addendum)
Have your second opinion at [facility] and see what they have to say.    Follow-up with our office as needed.  Please call and ask to speak with a nurse if you develop questions or concerns.

## 2020-09-22 NOTE — Progress Notes (Signed)
Outpatient Surgical Follow Up  09/22/2020  Ana Washington is an 75 y.o. female.   Chief Complaint  Patient presents with   Follow-up    HPI: This is a 75 year old female well-known to me with known history of giant paraesophageal hernia type IV.  She recently had a food impaction requiring endoscopic intervention.  She continues to have dysphagia she is a pulmonary cripple 7.  She also is in a wheelchair and is very malnourished.  Past Medical History:  Diagnosis Date   Arthritis    Atrial fibrillation ([place])    Bronchiectasis (HCC)    CAD (coronary artery disease) 07/30/2017   CHF (congestive heart failure) (HCC)    Dumping syndrome    Essential hypertension, malignant 10/03/2013   Family history of adverse reaction to anesthesia    sister [name]   GERD (gastroesophageal reflux disease)    Headache    MIGRAINES   Myocardial infarction ([place]) 2007   Non-STEMI   [name] (postoperative nausea and vomiting)    Psoriasis    PUD (peptic ulcer disease)     Past Surgical History:  Procedure Laterality Date   BACK SURGERY     CHOLECYSTECTOMY     ESOPHAGOGASTRODUODENOSCOPY N/A 09/04/2020   Procedure: ESOPHAGOGASTRODUODENOSCOPY (EGD);  Surgeon: Virgel Manifold, MD;  Location[facility];  Service: Endoscopy;  Laterality: N/A;   ESOPHAGOGASTRODUODENOSCOPY (EGD) WITH PROPOFOL N/A 03/19/2019   Procedure: ESOPHAGOGASTRODUODENOSCOPY (EGD) WITH PROPOFOL;  Surgeon: [name], MD;  Location: [facility] ENDOSCOPY;  Service: Gastroenterology;  Laterality: N/A;  *Note to anesthesia: Per pt's pulmonologist, if intubating, please extubate to BIPAP.   EYE SURGERY     FOOT SURGERY     INTRAMEDULLARY (IM) NAIL INTERTROCHANTERIC Right 09/30/2018   Procedure: INTRAMEDULLARY (IM) NAIL INTERTROCHANTRIC;  Surgeon: Dereck Leep, MD;  Location: [facility] ORS;  Service: Orthopedics;  Laterality: Right;   KYPHOPLASTY N/A 07/05/2016   Procedure: KYPHOPLASTY T - 9;  Surgeon: Hessie Knows, MD;  Location: [facility] ORS;   Service: Orthopedics;  Laterality: N/A;   KYPHOPLASTY N/A 11/29/2017   Procedure: Iona Hansen;  Surgeon: Hessie Knows, MD;  Location: [facility] ORS;  Service: Orthopedics;  Laterality: N/A;  L2 and L3   KYPHOPLASTY N/A 12/18/2017   Procedure: KYPHOPLASTY L1;  Surgeon: Hessie Knows, MD;  Location: [facility] ORS;  Service: Orthopedics;  Laterality: N/A;   KYPHOPLASTY N/A 01/05/2018   Procedure: KYPHOPLASTY-T11,T12;  Surgeon: Hessie Knows, MD;  Location: [facility] ORS;  Service: Orthopedics;  Laterality: N/A;   KYPHOPLASTY N/A 04/05/2018   Procedure: T10 KYPHOPLASTY;  Surgeon: Hessie Knows, MD;  Location: [facility] ORS;  Service: Orthopedics;  Laterality: N/A;   KYPHOPLASTY N/A 04/12/2018   Procedure: KYPHOPLASTY T7,8;  Surgeon: Hessie Knows, MD;  Location: [facility] ORS;  Service: Orthopedics;  Laterality: N/A;   KYPHOPLASTY N/A 04/19/2018   Procedure: KYPHOPLASTY T5, T6;  Surgeon: Hessie Knows, MD;  Location: [facility] ORS;  Service: Orthopedics;  Laterality: N/A;   LUNG SURGERY  1990 and 1996   THOROCOTOMY WITH LOBECTOMY     LEFT LOWER THORACOTOMY / RIGHT MIDDLE LOBECTOMY    Family History  Problem Relation Age of Onset   Hypertension Mother    Hypertension Father     Social History:  reports that she has never smoked. She has never used smokeless tobacco. She reports that she does not drink alcohol and does not use drugs.  Allergies:  Allergies  Allergen Reactions   Codeine Nausea And Vomiting   Sulfa Antibiotics Diarrhea and Anxiety    "Makes her feel  shaky."   Penicillins Rash    Has patient had a PCN reaction causing immediate rash, facial/tongue/throat swelling, SOB or lightheadedness with hypotension: Unknown Has patient had a PCN reaction causing severe rash involving mucus membranes or skin necrosis: Unknown Has patient had a PCN reaction that required hospitalization: Unknown Has patient had a PCN reaction occurring within the last 10 years: No If all of the above answers are "NO", then may  proceed with Cephalosporin use.     Medications reviewed.    ROS Full ROS performed and is otherwise negative other than what is stated in HPI   BP (!) 159/88   Pulse 60   Temp 98 F (36.7 C)   Ht '5\' 4"'$  (1.626 m)   Wt 99 lb (44.9 kg)   SpO2 98%   BMI 16.99 kg/m   Physical Exam He is chronically ill.  Debilitated weighs only 99 pounds and her BMI is 16.  She is wearing oxygen and comes in a wheelchair with sister Chest: decrease bs Cardia: s1,s2, no murmurs Abd: soft, nt, no peritonitis Ext: well perfused and no edema     Assessment/Plan: 75 year old female with severe COPD pulmonary cripple and giant paraesophageal hernia x4 causing significant symptoms of dysphagia and food impaction.  Extremely difficult situation.  She remains on home oxygen and she is not ambulatory.  She is however pristine from medical perspective.  She is suffering .  Given her comorbidities I do not hospital such as ours is request this for her.  She has already feeling diffuse [employer] for paraesophageal hernia repair.  She does have an consultation with [facility].  I prescribed to expertise advised from [facility] surgery Will follow her in   Greater than 50% of the 35 minutes  visit was spent in counseling/coordination of care   Caroleen Hamman, MD [place] Surgeon

## 2020-09-28 ENCOUNTER — Ambulatory Visit: Payer: Medicare HMO | Admitting: Gastroenterology

## 2020-09-28 ENCOUNTER — Ambulatory Visit: Payer: Medicare HMO | Admitting: Family

## 2020-09-29 ENCOUNTER — Ambulatory Visit: Payer: Medicare HMO | Admitting: Family

## 2020-10-12 ENCOUNTER — Ambulatory Visit: Payer: Medicare HMO | Admitting: Family

## 2020-10-22 ENCOUNTER — Telehealth (HOSPITAL_COMMUNITY): Payer: Self-pay

## 2020-10-22 NOTE — Telephone Encounter (Signed)
Contacted Ana Washington today.  She states she is home now.  She states she has a lot of nurses coming out to work with her at the moment.  She wants me to call and check on her periodically and when she is ready for me to come back she will let me know.  Right now its just a lot with her picc line.  Will call and check on her but she wishes no visits at this time.   [place] [phone]

## 2020-11-12 ENCOUNTER — Telehealth: Payer: Self-pay | Admitting: Nurse Practitioner

## 2020-11-12 NOTE — Telephone Encounter (Signed)
Spoke with patient about rescheduling the Palliative Consult and she has declined palliative services at this time.  She stated that she has a home health company coming out now and doesn't feel that she needs our services.  I explained to patient that if she changed her mind in the future and wanted [place] to come out to please let her PCP know so that they can send [place] another referral and she agreed to do so.

## 2020-11-17 ENCOUNTER — Emergency Department: Payer: Medicare HMO

## 2020-11-17 ENCOUNTER — Encounter: Payer: Self-pay | Admitting: Internal Medicine

## 2020-11-17 ENCOUNTER — Inpatient Hospital Stay
Admission: EM | Admit: 2020-11-17 | Discharge: 2020-11-21 | DRG: 190 | Disposition: A | Discharge status: Home Health | Payer: Medicare HMO | Attending: Internal Medicine | Admitting: Internal Medicine

## 2020-11-17 ENCOUNTER — Other Ambulatory Visit: Payer: Self-pay

## 2020-11-17 DIAGNOSIS — Z9981 Dependence on supplemental oxygen: Secondary | ICD-10-CM

## 2020-11-17 DIAGNOSIS — J9621 Acute and chronic respiratory failure with hypoxia: Secondary | ICD-10-CM | POA: Diagnosis present

## 2020-11-17 DIAGNOSIS — I248 Other forms of acute ischemic heart disease: Secondary | ICD-10-CM | POA: Diagnosis present

## 2020-11-17 DIAGNOSIS — I110 Hypertensive heart disease with heart failure: Secondary | ICD-10-CM | POA: Diagnosis present

## 2020-11-17 DIAGNOSIS — J9811 Atelectasis: Secondary | ICD-10-CM | POA: Diagnosis present

## 2020-11-17 DIAGNOSIS — I482 Chronic atrial fibrillation, unspecified: Secondary | ICD-10-CM | POA: Diagnosis present

## 2020-11-17 DIAGNOSIS — I10 Essential (primary) hypertension: Secondary | ICD-10-CM | POA: Diagnosis present

## 2020-11-17 DIAGNOSIS — R64 Cachexia: Secondary | ICD-10-CM | POA: Diagnosis present

## 2020-11-17 DIAGNOSIS — Z880 Allergy status to penicillin: Secondary | ICD-10-CM

## 2020-11-17 DIAGNOSIS — R079 Chest pain, unspecified: Secondary | ICD-10-CM | POA: Diagnosis not present

## 2020-11-17 DIAGNOSIS — D638 Anemia in other chronic diseases classified elsewhere: Secondary | ICD-10-CM | POA: Diagnosis present

## 2020-11-17 DIAGNOSIS — I480 Paroxysmal atrial fibrillation: Secondary | ICD-10-CM | POA: Diagnosis present

## 2020-11-17 DIAGNOSIS — K449 Diaphragmatic hernia without obstruction or gangrene: Secondary | ICD-10-CM | POA: Diagnosis present

## 2020-11-17 DIAGNOSIS — Z20822 Contact with and (suspected) exposure to covid-19: Secondary | ICD-10-CM | POA: Diagnosis present

## 2020-11-17 DIAGNOSIS — Z7901 Long term (current) use of anticoagulants: Secondary | ICD-10-CM

## 2020-11-17 DIAGNOSIS — K219 Gastro-esophageal reflux disease without esophagitis: Secondary | ICD-10-CM | POA: Diagnosis present

## 2020-11-17 DIAGNOSIS — E782 Mixed hyperlipidemia: Secondary | ICD-10-CM | POA: Diagnosis present

## 2020-11-17 DIAGNOSIS — M810 Age-related osteoporosis without current pathological fracture: Secondary | ICD-10-CM | POA: Diagnosis present

## 2020-11-17 DIAGNOSIS — Z7952 Long term (current) use of systemic steroids: Secondary | ICD-10-CM

## 2020-11-17 DIAGNOSIS — G894 Chronic pain syndrome: Secondary | ICD-10-CM | POA: Diagnosis present

## 2020-11-17 DIAGNOSIS — R0602 Shortness of breath: Secondary | ICD-10-CM | POA: Diagnosis not present

## 2020-11-17 DIAGNOSIS — I5033 Acute on chronic diastolic (congestive) heart failure: Secondary | ICD-10-CM | POA: Diagnosis present

## 2020-11-17 DIAGNOSIS — I252 Old myocardial infarction: Secondary | ICD-10-CM

## 2020-11-17 DIAGNOSIS — Z66 Do not resuscitate: Secondary | ICD-10-CM | POA: Diagnosis present

## 2020-11-17 DIAGNOSIS — I214 Non-ST elevation (NSTEMI) myocardial infarction: Secondary | ICD-10-CM

## 2020-11-17 DIAGNOSIS — Z8616 Personal history of COVID-19: Secondary | ICD-10-CM

## 2020-11-17 DIAGNOSIS — F419 Anxiety disorder, unspecified: Secondary | ICD-10-CM | POA: Diagnosis present

## 2020-11-17 DIAGNOSIS — Z79899 Other long term (current) drug therapy: Secondary | ICD-10-CM

## 2020-11-17 DIAGNOSIS — Z9049 Acquired absence of other specified parts of digestive tract: Secondary | ICD-10-CM

## 2020-11-17 DIAGNOSIS — J479 Bronchiectasis, uncomplicated: Secondary | ICD-10-CM

## 2020-11-17 DIAGNOSIS — E43 Unspecified severe protein-calorie malnutrition: Secondary | ICD-10-CM | POA: Diagnosis present

## 2020-11-17 DIAGNOSIS — I2510 Atherosclerotic heart disease of native coronary artery without angina pectoris: Secondary | ICD-10-CM | POA: Diagnosis present

## 2020-11-17 DIAGNOSIS — Z681 Body mass index (BMI) 19 or less, adult: Secondary | ICD-10-CM

## 2020-11-17 DIAGNOSIS — R0789 Other chest pain: Secondary | ICD-10-CM

## 2020-11-17 DIAGNOSIS — J471 Bronchiectasis with (acute) exacerbation: Principal | ICD-10-CM | POA: Diagnosis present

## 2020-11-17 DIAGNOSIS — K2100 Gastro-esophageal reflux disease with esophagitis, without bleeding: Secondary | ICD-10-CM | POA: Diagnosis present

## 2020-11-17 DIAGNOSIS — Z882 Allergy status to sulfonamides status: Secondary | ICD-10-CM

## 2020-11-17 DIAGNOSIS — Z885 Allergy status to narcotic agent status: Secondary | ICD-10-CM

## 2020-11-17 DIAGNOSIS — M1990 Unspecified osteoarthritis, unspecified site: Secondary | ICD-10-CM | POA: Diagnosis present

## 2020-11-17 DIAGNOSIS — A312 Disseminated mycobacterium avium-intracellulare complex (DMAC): Secondary | ICD-10-CM | POA: Diagnosis present

## 2020-11-17 DIAGNOSIS — Z8249 Family history of ischemic heart disease and other diseases of the circulatory system: Secondary | ICD-10-CM

## 2020-11-17 DIAGNOSIS — I272 Pulmonary hypertension, unspecified: Secondary | ICD-10-CM | POA: Diagnosis present

## 2020-11-17 LAB — CBC
HCT: 36.2 % (ref 36.0–46.0)
Hemoglobin: 11.6 g/dL — ABNORMAL LOW (ref 12.0–15.0)
MCH: 32.8 pg (ref 26.0–34.0)
MCHC: 32 g/dL (ref 30.0–36.0)
MCV: 102.3 fL — ABNORMAL HIGH (ref 80.0–100.0)
Platelets: 274 10*3/uL (ref 150–400)
RBC: 3.54 MIL/uL — ABNORMAL LOW (ref 3.87–5.11)
RDW: 12.7 % (ref 11.5–15.5)
WBC: 20.6 10*3/uL — ABNORMAL HIGH (ref 4.0–10.5)
nRBC: 0 % (ref 0.0–0.2)

## 2020-11-17 LAB — BASIC METABOLIC PANEL
Anion gap: 10 (ref 5–15)
BUN: 37 mg/dL — ABNORMAL HIGH (ref 8–23)
CO2: 33 mmol/L — ABNORMAL HIGH (ref 22–32)
Calcium: 9.4 mg/dL (ref 8.9–10.3)
Chloride: 93 mmol/L — ABNORMAL LOW (ref 98–111)
Creatinine, Ser: 0.67 mg/dL (ref 0.44–1.00)
GFR, Estimated: >60 mL/min (ref >60)
Glucose, Bld: 100 mg/dL — ABNORMAL HIGH (ref 70–99)
Potassium: 4.5 mmol/L (ref 3.5–5.1)
Sodium: 136 mmol/L (ref 135–145)

## 2020-11-17 LAB — TROPONIN I (HIGH SENSITIVITY)
Troponin I (High Sensitivity): 135 ng/L (ref <18)
Troponin I (High Sensitivity): 210 ng/L (ref <18)
Troponin I (High Sensitivity): 703 ng/L (ref <18)

## 2020-11-17 LAB — RESP PANEL BY RT-PCR (FLU A&B, COVID) ARPGX2
Influenza A by PCR: NEGATIVE
Influenza B by PCR: NEGATIVE
SARS Coronavirus 2 by RT PCR: NEGATIVE

## 2020-11-17 LAB — BRAIN NATRIURETIC PEPTIDE: B Natriuretic Peptide: 416 pg/mL — ABNORMAL HIGH (ref 0.0–100.0)

## 2020-11-17 LAB — PROTIME-INR
INR: 1.1 (ref 0.8–1.2)
Prothrombin Time: 14.4 seconds (ref 11.4–15.2)

## 2020-11-17 LAB — LACTIC ACID, PLASMA: Lactic Acid, Venous: 1.2 mmol/L (ref 0.5–1.9)

## 2020-11-17 LAB — PROCALCITONIN: Procalcitonin: <0.1 ng/mL

## 2020-11-17 MED ORDER — HEPARIN BOLUS VIA INFUSION
2700.0000 [IU] | Freq: Once | INTRAVENOUS | Status: AC
  Administered 2020-11-18: 2700 [IU] via INTRAVENOUS
  Filled 2020-11-17: qty 2700

## 2020-11-17 MED ORDER — FUROSEMIDE 10 MG/ML IJ SOLN
60.0000 mg | Freq: Once | INTRAMUSCULAR | Status: AC
  Administered 2020-11-17: 60 mg via INTRAVENOUS
  Filled 2020-11-17: qty 8

## 2020-11-17 MED ORDER — PROPOFOL 1000 MG/100ML IV EMUL
INTRAVENOUS | Status: AC
  Filled 2020-11-17: qty 100

## 2020-11-17 MED ORDER — METHYLPREDNISOLONE SODIUM SUCC 125 MG IJ SOLR
125.0000 mg | Freq: Once | INTRAMUSCULAR | Status: AC
  Administered 2020-11-17: 125 mg via INTRAVENOUS
  Filled 2020-11-17: qty 2

## 2020-11-17 MED ORDER — ONDANSETRON HCL 4 MG/2ML IJ SOLN
4.0000 mg | Freq: Once | INTRAMUSCULAR | Status: AC
  Administered 2020-11-17: 4 mg via INTRAVENOUS
  Filled 2020-11-17: qty 2

## 2020-11-17 MED ORDER — IOHEXOL 350 MG/ML SOLN
75.0000 mL | Freq: Once | INTRAVENOUS | Status: AC | PRN
  Administered 2020-11-17: 75 mL via INTRAVENOUS

## 2020-11-17 MED ORDER — HEPARIN (PORCINE) 25000 UT/250ML-% IV SOLN
800.0000 [IU]/h | INTRAVENOUS | Status: DC
  Administered 2020-11-18: 550 [IU]/h via INTRAVENOUS
  Administered 2020-11-19: 800 [IU]/h via INTRAVENOUS
  Filled 2020-11-17 (×2): qty 250

## 2020-11-17 MED ORDER — IPRATROPIUM-ALBUTEROL 0.5-2.5 (3) MG/3ML IN SOLN
6.0000 mL | Freq: Once | RESPIRATORY_TRACT | Status: AC
  Administered 2020-11-17: 6 mL via RESPIRATORY_TRACT
  Filled 2020-11-17: qty 6

## 2020-11-17 MED ORDER — HEPARIN SODIUM (PORCINE) 5000 UNIT/ML IJ SOLN: 60.0000 [IU]/kg | Freq: Once | INTRAMUSCULAR | Status: DC

## 2020-11-17 NOTE — ED Provider Notes (Signed)
[facility] Emergency Department Provider Note ____________________________________________   Event Date/Time   First MD Initiated Contact with Patient 11/17/20 2017     (approximate)  I have reviewed the triage vital signs and the nursing notes.  HISTORY  Chief Complaint Chest Pain and Shortness of Breath   HPI Ana Washington is a 75 y.o. femalewho presents to the ED for evaluation of chest pain and SOB.   Chart review indicates history of chronic bronchiectasis on 2 L home O2.  She saw her pulmonologist yesterday and was prescribed Decadron and Levaquin.  Otherwise she is on Eliquis for atrial fibrillation, history of CAD and CHF.  Patient presents to the ED, accompanied by her sister, for evaluation of worsening shortness of breath, congestion and chest pain.  She reports taking 1 dose of her Levaquin this morning, and compliance with her other medications, but despite this has had worsening congestion and chest pain, radiating to her thoracic back.  She reports sharp pains of moderate intensity.  Worse with respirations.  Reports upper respiratory congestion and productive cough.   Denies fever, syncope, abdominal pain or emesis.  She has a PICC line in place and dependent on TPN.  Has an upcoming surgery at [facility] the beginning of November for hiatal hernia.  Past Medical History:  Diagnosis Date   Arthritis    Atrial fibrillation ([place])    Bronchiectasis (HCC)    CAD (coronary artery disease) 07/30/2017   CHF (congestive heart failure) (HCC)    Dumping syndrome    Essential hypertension, malignant 10/03/2013   Family history of adverse reaction to anesthesia    sister [name]   GERD (gastroesophageal reflux disease)    Headache    MIGRAINES   Myocardial infarction ([place]) 2007   Non-STEMI   [name] (postoperative nausea and vomiting)    Psoriasis    PUD (peptic ulcer disease)     Patient Active Problem List   Diagnosis Date Noted   NSTEMI  (non-ST elevated myocardial infarction) ([place]) 11/17/2020   Chest pain 09/14/2020   Esophageal obstruction due to food impaction    Food impaction of esophagus    Congenital malformation of esophagus    Hiatal hernia    Gastric bezoar    Foreign body in esophagus    Diverticulosis of small intestine with hemorrhage    CHF (congestive heart failure) ([place]) 08/24/2020   Chronic hyponatremia 08/23/2020   Acute on chronic diastolic CHF (congestive heart failure) ([place]) 08/23/2020   Morphine Overdose, accidental or unintentional, initial encounter 08/23/2020   Bronchiectasis with acute exacerbation ([place]) 07/31/2020   CAP (community acquired pneumonia) 07/30/2020   Acute exacerbation of bronchiectasis ([place]) 07/14/2020   COVID-19 virus infection 07/14/2020   Lactic acidosis 07/14/2020   SIRS (systemic inflammatory response syndrome) ([place]) 07/14/2020   Elevated troponin 07/14/2020   Anxiety    Constipation    Pressure injury of right heel, stage 1    Bronchiectasis with (acute) exacerbation ([place]) 04/29/2020   Hemoptysis    Chronic respiratory failure with hypoxia ([place])    AKI (acute kidney injury) ([place])    Bronchiectasis ([place]) 09/08/2019   Atrial fibrillation, chronic ([place]) 09/08/2019   Closed right hip fracture ([place]) 09/27/2018   Intractable nausea and vomiting 04/08/2018   Atrial fibrillation with RVR ([place]) 04/08/2018   Thoracic radiculitis (Bilateral) 03/29/2018   Vasovagal episode 03/29/2018   Closed compression fracture of T10 thoracic vertebra, sequela 03/29/2018   Abnormal MRI, lumbar spine (12/15/2016) 03/21/2018  Lumbar compression fractures, sequela (L1, L2, L3, L4, and L5) 03/21/2018   Closed compression fracture of L1 lumbar vertebra, sequela 03/21/2018   Closed compression fracture of L2 lumbar vertebra, sequela 03/21/2018   Closed compression fracture of L3 lumbar vertebra, sequela 03/21/2018   Closed compression fracture of L4 lumbar vertebra, sequela 03/21/2018   Closed  compression fracture of L5 lumbar vertebra, sequela 03/21/2018   Thoracic compression fracture, sequela (T5, T9, T10, T11, and T12) 03/21/2018   Closed compression fracture of T5 thoracic vertebra, sequela 03/21/2018   Closed compression fracture of T9 thoracic vertebra, sequela 03/21/2018   Close compression fracture of T11 thoracic vertebra, sequela 03/21/2018   Closed compression fracture of T12 thoracic vertebra, sequela 03/21/2018   Lumbar facet hypertrophy 03/21/2018   Grade 1  Lumbar Anterolisthesis of L3/4 and L4/5 03/21/2018   Lumbar central spinal stenosis (Multilevel), 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 claudication 03/21/2018   Chronic anticoagulation (ELIQUIS) 03/21/2018   History of pelvic fracture 03/21/2018   Chronic musculoskeletal pain 03/21/2018   Neurogenic pain 03/21/2018   Long term prescription benzodiazepine use 03/21/2018   DDD (degenerative disc disease), 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   Adult bronchiectasis ([place]) 03/01/2018   Diverticulitis 03/01/2018   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[place]) 03/01/2018   Migraines 03/01/2018   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   Osteoporosis, 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   Psoriasis 03/01/2018   Chronic upper back pain (Primary Area of Pain) (Bilateral) (R>L) 03/01/2018   Chronic low back pain (Secondary Area of Pain) (Bilateral) (R>L) w/o sciatica 03/01/2018   Chronic pain syndrome 03/01/2018   Long term current use of opiate analgesic 03/01/2018   Pharmacologic therapy 03/01/2018   Disorder of skeletal system 03/01/2018   Problems influencing health status 03/01/2018   History of kyphoplasty (L1, L2, L3, T9, T11, and T12) 01/05/2018   Malnutrition of moderate degree ([place]) 08/03/2017   GERD (gastroesophageal reflux disease) 07/30/2017   Pelvic fracture ([place]) 07/30/2017   Dysphagia 09/13/2016   Unintended weight loss 09/13/2016   Essential hypertension 10/03/2013   Osteoarthritis of knees (Bilateral) 10/03/2013   Primary localized  osteoarthrosis, lower leg 10/03/2013   Non-ischemic cardiomyopathy ([place]) 09/12/2013   Coronary artery disease involving native coronary artery of native heart without angina pectoris 07/28/2012   Hyperlipidemia, mixed 07/28/2012   DDD (degenerative disc disease), lumbar 02/29/2012    Past Surgical History:  Procedure Laterality Date   BACK SURGERY     CHOLECYSTECTOMY     ESOPHAGOGASTRODUODENOSCOPY N/A 09/04/2020   Procedure: ESOPHAGOGASTRODUODENOSCOPY (EGD);  Surgeon: Virgel Manifold, MD;  Location: [facility] ENDOSCOPY;  Service: Endoscopy;  Laterality: N/A;   ESOPHAGOGASTRODUODENOSCOPY (EGD) WITH PROPOFOL N/A 03/19/2019   Procedure: ESOPHAGOGASTRODUODENOSCOPY (EGD) WITH PROPOFOL;  Surgeon: [name], MD;  Location: [facility] ENDOSCOPY;  Service: Gastroenterology;  Laterality: N/A;  *Note to anesthesia: Per pt's pulmonologist, if intubating, please extubate to BIPAP.   EYE SURGERY     FOOT SURGERY     INTRAMEDULLARY (IM) NAIL INTERTROCHANTERIC Right 09/30/2018   Procedure: INTRAMEDULLARY (IM) NAIL INTERTROCHANTRIC;  Surgeon: Dereck Leep, MD;  Location: [facility] ORS;  Service: Orthopedics;  Laterality: Right;   KYPHOPLASTY N/A 07/05/2016   Procedure: KYPHOPLASTY T - 9;  Surgeon: Hessie Knows, MD;  Location: [facility] ORS;  Service: Orthopedics;  Laterality: N/A;   KYPHOPLASTY N/A 11/29/2017   Procedure: Iona Hansen;  Surgeon: Hessie Knows, MD;  Location: [facility] ORS;  Service: Orthopedics;  Laterality: N/A;  L2 and L3   KYPHOPLASTY N/A 12/18/2017   Procedure: KYPHOPLASTY L1;  Surgeon: Hessie Knows, MD;  Location: [facility] ORS;  Service: Orthopedics;  Laterality: N/A;   KYPHOPLASTY N/A 01/05/2018   Procedure: KYPHOPLASTY-T11,T12;  Surgeon: Hessie Knows, MD;  Location: [facility] ORS;  Service: Orthopedics;  Laterality: N/A;   KYPHOPLASTY N/A 04/05/2018   Procedure: T10 KYPHOPLASTY;  Surgeon: Hessie Knows, MD;  Location: [facility] ORS;  Service: Orthopedics;  Laterality: N/A;   KYPHOPLASTY N/A 04/12/2018    Procedure: KYPHOPLASTY T7,8;  Surgeon: Hessie Knows, MD;  Location: [facility] ORS;  Service: Orthopedics;  Laterality: N/A;   KYPHOPLASTY N/A 04/19/2018   Procedure: KYPHOPLASTY T5, T6;  Surgeon: Hessie Knows, MD;  Location: [facility] ORS;  Service: Orthopedics;  Laterality: N/A;   LUNG SURGERY  1990 and 1996   THOROCOTOMY WITH LOBECTOMY     LEFT LOWER THORACOTOMY / RIGHT MIDDLE LOBECTOMY    Prior to Admission medications   Medication Sig Start Date End Date Taking? Authorizing Provider  acetaminophen (TYLENOL) 500 MG tablet Take 1,000 mg by mouth every 6 (six) hours as needed for mild pain or headache.     [provider]  acetylcysteine (MUCOMYST) 20 % nebulizer solution Take 4 mLs by nebulization daily. 04/22/20   [provider]  amiodarone (PACERONE) 200 MG tablet Take 1 tablet (200 mg total) by mouth 2 (two) times daily. 08/07/20   Val Riles, MD  apixaban (ELIQUIS) 5 MG TABS tablet Take 1 tablet (5 mg total) by mouth 2 (two) times daily. 09/15/20   Lorella Nimrod, MD  Ascorbic Acid (VITAMIN C) 1000 MG tablet Take 1,000 mg by mouth daily.    [provider]  benzonatate (TESSALON) 200 MG capsule Take 200 mg by mouth 3 (three) times daily as needed. 04/02/20   [provider]  COMBIVENT RESPIMAT 20-100 MCG/ACT AERS respimat 1 puff every 6 (six) hours as needed for wheezing. 06/09/20   [provider]  digoxin (LANOXIN) 0.125 MG tablet Take 125 mcg by mouth daily. 05/11/20   [provider]  ferrous sulfate 325 (65 FE) MG EC tablet Take 325 mg by mouth daily.     [provider]  furosemide (LASIX) 20 MG tablet Take 1 tablet (20 mg total) by mouth daily as needed. Patient taking differently: Take 20 mg by mouth daily as needed. Take as needed for weight gain of 2 lbs overnight or 5 lbs in a week 09/16/20   Darylene Price A, FNP  gabapentin (NEURONTIN) 300 MG capsule Take 300 mg by mouth at bedtime as needed.    [provider]   glycopyrrolate (ROBINUL) 1 MG tablet Take 1 mg by mouth 3 (three) times daily.    [provider]  Infant Care Products [facility]) OINT Apply 1 application topically as needed. 08/17/20   [provider]  levalbuterol (XOPENEX) 0.31 MG/3ML nebulizer solution Inhale 3 mLs into the lungs every 6 (six) hours as needed. 12/10/19   [provider]  LORazepam (ATIVAN) 0.5 MG tablet Take 1 tablet (0.5 mg total) by mouth at bedtime. 09/10/19   Enzo Bi, MD  losartan (COZAAR) 100 MG tablet Take 100 mg by mouth daily. 08/28/20   [provider]  metoprolol succinate (TOPROL-XL) 100 MG 24 hr tablet Take 100 mg by mouth daily. 08/31/20   [provider]  NAC 600 MG CAPS Take 600 mg by mouth daily after breakfast.  08/17/19   [provider]  omeprazole (PRILOSEC) 20 MG capsule Take 20 mg by mouth daily. 08/26/19   [provider]  polyethylene glycol (MIRALAX / GLYCOLAX) 17 g packet Take 17 g by  mouth daily as needed for severe constipation. 05/02/20   Loletha Grayer, MD  potassium chloride (KLOR-CON) 10 MEQ tablet Take 10 mEq by mouth daily. 08/16/20   [provider]  predniSONE (DELTASONE) 10 MG tablet Take 10 mg by mouth daily. 08/07/20   [provider]  Probiotic Product (ALIGN) 4 MG CAPS Take 4 mg by mouth daily.    [provider]  senna (SENOKOT) 8.6 MG TABS tablet Take 2 tablets (17.2 mg total) by mouth at bedtime. Patient taking differently: Take 1-2 tablets by mouth daily as needed. 05/02/20   Loletha Grayer, MD  Sodium Chloride, Inhalant, 7 % NEBU Inhale 4 mLs into the lungs daily.    [provider]    Allergies Codeine, Sulfa antibiotics, and Penicillins  Family History  Problem Relation Age of Onset   Hypertension Mother    Hypertension Father     Social History Social History   Tobacco Use   Smoking status: Never   Smokeless tobacco: Never  Vaping Use   Vaping Use: Never used  Substance  Use Topics   Alcohol use: No   Drug use: No    Review of Systems  Constitutional: No fever/chills Eyes: No visual changes. ENT: No sore throat.  Positive for respiratory congestion Cardiovascular: Positive for chest pain. Respiratory: Positive for cough and shortness of breath. Gastrointestinal: No abdominal pain.  No nausea, no vomiting.  No diarrhea.  No constipation. Genitourinary: Negative for dysuria. Musculoskeletal: Negative for back pain. Skin: Negative for rash. Neurological: Negative for headaches, focal weakness or numbness.  ____________________________________________   PHYSICAL EXAM:  VITAL SIGNS: Vitals:   11/17/20 1346 11/17/20 1346  BP:  121/80  Pulse:  77  Resp:  (!) 22  Temp:  98.3 F (36.8 C)  SpO2: 100% 100%    Constitutional: Alert and oriented.  Appears uncomfortable.  Wet-sounding cough.  Speaking in full sentences. Eyes: Conjunctivae are normal. PERRL. EOMI. Head: Atraumatic. Nose: Clear congestion/rhinnorhea. Mouth/Throat: Mucous membranes are moist.  Oropharynx non-erythematous. Neck: No stridor. No cervical spine tenderness to palpation. Cardiovascular: Normal rate, regular rhythm. Grossly normal heart sounds.  Good peripheral circulation. Respiratory: Mild tachypnea to the mid 20s.  No distress or retractions.  Inspiratory crackles are present throughout, worse bibasilarly.  Prolonged expiratory phase with some mild wheezing throughout as well. Gastrointestinal: Soft , nondistended, nontender to palpation. No CVA tenderness. Musculoskeletal:   No joint effusions. No signs of acute trauma. PICC line to RUE Trace pitting edema to bilateral lower extremities up to the knees. Neurologic:  Normal speech and language. No gross focal neurologic deficits are appreciated. No gait instability noted. Skin:  Skin is warm, dry and intact. No rash noted. Psychiatric: Mood and affect are normal. Speech and behavior are  normal. ____________________________________________   LABS (all labs ordered are listed, but only abnormal results are displayed)  Labs Reviewed  BASIC METABOLIC PANEL - Abnormal; Notable for the following components:      Result Value   Chloride 93 (*)    CO2 33 (*)    Glucose, Bld 100 (*)    BUN 37 (*)    All other components within normal limits  CBC - Abnormal; Notable for the following components:   WBC 20.6 (*)    RBC 3.54 (*)    Hemoglobin 11.6 (*)    MCV 102.3 (*)    All other components within normal limits  BRAIN NATRIURETIC PEPTIDE - Abnormal; Notable for the following components:   B Natriuretic Peptide  416.0 (*)    All other components within normal limits  TROPONIN I (HIGH SENSITIVITY) - Abnormal; Notable for the following components:   Troponin I (High Sensitivity) 135 (*)    All other components within normal limits  TROPONIN I (HIGH SENSITIVITY) - Abnormal; Notable for the following components:   Troponin I (High Sensitivity) 210 (*)    All other components within normal limits  TROPONIN I (HIGH SENSITIVITY) - Abnormal; Notable for the following components:   Troponin I (High Sensitivity) 703 (*)    All other components within normal limits  RESP PANEL BY RT-PCR (FLU A&B, COVID) ARPGX2  PROTIME-INR  PROCALCITONIN  LACTIC ACID, PLASMA  PROCALCITONIN  LACTIC ACID, PLASMA  APTT  HEPARIN LEVEL (UNFRACTIONATED)  TROPONIN I (HIGH SENSITIVITY)   ____________________________________________  12 Lead EKG  Poor quality EKG with wandering baseline and artifact.  Regular rhythm, narrow complex and no clear STEMI. ____________________________________________  RADIOLOGY  ED MD interpretation: 2 view CXR with chronic scarring without clear acute infiltration or PTX  Official radiology report(s): DG Chest 2 View  Result Date: 11/17/2020 CLINICAL DATA:  Shortness of breath.  Chest pain. EXAM: CHEST - 2 VIEW COMPARISON:  09/13/2020 FINDINGS: Heart size remains  mildly enlarged. Chronic atherosclerosis of the aorta. Chronic emphysema and pulmonary scarring with pulmonary resection in the right upper lung and presumed lobectomy on the left. Chronic elevation of the left hemidiaphragm. No sign of acute infiltrate. Multiple previous vertebral augmentations. Right arm PICC tip at the SVC RA junction. IMPRESSION: Previous pulmonary resection is a. chronic elevation of the left hemidiaphragm. Emphysema and pulmonary scarring as seen previously. No active process identified. PICC tip at the SVC RA junction. Electronically Signed   By: Nelson Chimes M.D.   On: 11/17/2020 14:45   CT Angio Chest PE W and/or Wo Contrast  Result Date: 11/17/2020 CLINICAL DATA:  chronic bronchiectasis, on antibiotics, worsening respiratory symptoms, pleuritic chest pains. eval infiltrate, effusion, PE EXAM: CT ANGIOGRAPHY CHEST WITH CONTRAST TECHNIQUE: Multidetector CT imaging of the chest was performed using the standard protocol during bolus administration of intravenous contrast. Multiplanar CT image reconstructions and MIPs were obtained to evaluate the vascular anatomy. CONTRAST:  33m OMNIPAQUE IOHEXOL 350 MG/ML SOLN COMPARISON:  09/14/2020 FINDINGS: Cardiovascular: No filling defects in the pulmonary arteries to suggest pulmonary emboli. Cardiomegaly. Multi-vessel coronary artery calcifications. Tortuous calcified aorta. No aneurysm. Mediastinum/Nodes: No mediastinal, hilar, or axillary adenopathy. Large hiatal hernia again noted, unchanged. Lungs/Pleura: Postoperative changes bilaterally. Bronchiectasis again seen bilaterally, right greater than left. Areas of scarring in the lungs. No pleural effusion or acute areas of consolidation. Upper Abdomen: Imaging into the upper abdomen demonstrates no acute findings. Musculoskeletal: Multilevel vertebroplasty changes throughout the thoracic and upper lumbar spine, unchanged. Review of the MIP images confirms the above findings. IMPRESSION: No  evidence of pulmonary embolus. Cardiomegaly, coronary artery disease. Stable chronic changes in the lungs with bronchiectasis and areas of scarring. Large hiatal hernia. Aortic Atherosclerosis (ICD10-I70.0). Electronically Signed   By: KRolm BaptiseM.D.   On: 11/17/2020 21:47    ____________________________________________   PROCEDURES and INTERVENTIONS  Procedure(s) performed (including Critical Care):  .1-3 Lead EKG Interpretation Performed by: SVladimir Crofts MD Authorized by: SVladimir Crofts MD     Interpretation: normal     ECG rate:  76   ECG rate assessment: normal     Rhythm: other rhythm     Ectopy: none     Conduction: normal   .Critical Care Performed by: SVladimir Crofts MD Authorized  by: Vladimir Crofts, MD   Critical care provider statement:    Critical care time (minutes):  30   Critical care time was exclusive of:  Separately billable procedures and treating other patients   Critical care was necessary to treat or prevent imminent or life-threatening deterioration of the following conditions:  Cardiac failure   Critical care was time spent personally by me on the following activities:  Ordering and performing treatments and interventions, ordering and review of laboratory studies, ordering and review of radiographic studies, pulse oximetry, re-evaluation of patient's condition, evaluation of patient's response to treatment, examination of patient and review of old charts  Medications  heparin bolus via infusion 2,700 Units (has no administration in time range)  heparin ADULT infusion 100 units/mL (25000 units/257m) (has no administration in time range)  methylPREDNISolone sodium succinate (SOLU-MEDROL) 125 mg/2 mL injection 125 mg (125 mg Intravenous Given 11/17/20 2306)  furosemide (LASIX) injection 60 mg (60 mg Intravenous Given 11/17/20 2306)  ipratropium-albuterol (DUONEB) 0.5-2.5 (3) MG/3ML nebulizer solution 6 mL (6 mLs Nebulization Given 11/17/20 2306)  iohexol  (OMNIPAQUE) 350 MG/ML injection 75 mL (75 mLs Intravenous Contrast Given 11/17/20 2124)  ondansetron (ZOFRAN) injection 4 mg (4 mg Intravenous Given 11/17/20 2311)    ____________________________________________   MDM / ED COURSE   Pleasant 75year old woman with chronic lung disease presents to the ED with worsening symptoms despite outpatient antibiotics and steroids.  She has evidence of volume overload and CHF exacerbation as well.  Blood work with rising troponins, likely secondary to her respiratory status.  EKG without STEMI or clear ischemia.  We will add labs to assess for sepsis and BNP to assess for CHF exacerbation.  We will obtain CT of her chest to ensure no PE, and to further elucidate parenchymal disease.  Anticipate medical admission.  Blood work with evidence of a non-STEMI with rapidly increasing troponins.  EKG without STEMI.  We will start on a heparin drip.  No evidence of acute infectious pathology with reassuring CTA without infiltration, negative procalcitonin.  Anticipate leukocytosis is due to her steroids.  We will initiate diuresis, steroids, breathing treatments and heparin drip.    ____________________________________________   FINAL CLINICAL IMPRESSION(S) / ED DIAGNOSES  Final diagnoses:  Other chest pain  Shortness of breath  NSTEMI (non-ST elevated myocardial infarction) ([facility]     ED Discharge Orders     None        Zalea Pete   Note:  This document was prepared using Dragon voice recognition software and may include unintentional dictation errors.    SVladimir Crofts MD 11/17/20 2352

## 2020-11-17 NOTE — Progress Notes (Signed)
[place] for heparin infusion  Indication: ACS/STEMI  Allergies  Allergen Reactions   Codeine Nausea And Vomiting   Sulfa Antibiotics Diarrhea and Anxiety    "Makes her feel shaky."   Penicillins Rash    Has patient had a PCN reaction causing immediate rash, facial/tongue/throat swelling, SOB or lightheadedness with hypotension: Unknown Has patient had a PCN reaction causing severe rash involving mucus membranes or skin necrosis: Unknown Has patient had a PCN reaction that required hospitalization: Unknown Has patient had a PCN reaction occurring within the last 10 years: No If all of the above answers are "NO", then may proceed with Cephalosporin use.     Patient Measurements: Height: '5\' 6"'$  (167.6 cm) Weight: 44.9 kg (99 lb) IBW/kg (Calculated) : 59.3 Heparin Dosing Weight: 44.9 kg  Vital Signs: Temp: 98.3 F (36.8 C) (10/18 1346) Temp Source: Oral (10/18 1346) BP: 121/80 (10/18 1346) Pulse Rate: 77 (10/18 1346)  Labs: Recent Labs    11/17/20 1350 11/17/20 1507 11/17/20 2241  HGB 11.6*  --   --   HCT 36.2  --   --   PLT 274  --   --   LABPROT 14.4  --   --   INR 1.1  --   --   CREATININE 0.67  --   --   TROPONINIHS 135* 210* 703*    Estimated Creatinine Clearance: 42.4 mL/min (by C-G formula based on SCr of 0.67 mg/dL).   Medical History: Past Medical History:  Diagnosis Date   Arthritis    Atrial fibrillation (HCC)    Bronchiectasis (HCC)    CAD (coronary artery disease) 07/30/2017   CHF (congestive heart failure) (HCC)    Dumping syndrome    Essential hypertension, malignant 10/03/2013   Family history of adverse reaction to anesthesia    sister [name]   GERD (gastroesophageal reflux disease)    Headache    MIGRAINES   Myocardial infarction ([place]) 2007   Non-STEMI   [name] (postoperative nausea and vomiting)    Psoriasis    PUD (peptic ulcer disease)     Medications:  PTA Meds:  Eliquis 5 mg BID    Assessment: Pt is 75 yo female with h/o Afib on Eliquis, c/o chest and back pain found with elevated troponin I, trending up 135 >> 703.   Goal of Therapy:  Heparin level 0.3-0.7 units/ml aPTT 66-102 seconds Monitor platelets by anticoagulation protocol: Yes   Plan:  Ordered bolus of 2700 units x 1 Start heparin infusion at 550 unit/hr DOAC PTA:  Will follow aPTT until correlation w/ HL Will check aPTT 8 hr after start of infusion Daily HL and CBC while on heparin.  Renda Rolls, PharmD, [facility] 11/17/2020 11:44 PM

## 2020-11-17 NOTE — ED Triage Notes (Signed)
Pt saw dr Lanney Gins yesterday who wanted her to be seen yesterday, pt came today because she isn't feeling any better, pt took 2 fluid pills this am, pt states that she started having chest pain and back pain this weekend, and increased difficulty breathing, pt has crackles that are audible across the room

## 2020-11-17 NOTE — H&P (Addendum)
History and Physical    [name] [id number] DOB: September 06, 1945 DOA: 11/17/2020  PCP: [name], MD  Cardiologist: Dr.Fath.   Patient coming from:  Home    Chief Complaint:  Chest pain / SOB   HPI: Ana Washington is a 75 y.o. female seen in ed with complaints of chest pain, and sob that started earlier today patient was seen by her pulmonologist yesterday and was given Levaquin.  Started  : Thursday.worse on Saturday.  Duration: hours   Frequency: intermittent   Location:middle of chest and then went to back   Quality:Ache   Rate: 8/10   Radiation:front and back   Aggravating:N/A  Alleviating:N/A  Associated factors:SOB, + nausea or vomiting., hot flashes.   Pt has past medical history of coronary artery disease, CHF, atrial fibrillation, peptic ulcer disease, hypertension, esophageal obstruction with food impaction, congestive heart failure, morphine overdose history, bronchiectasis.  Patient also has allergies to codeine sulfa and penicillin.  ED Course:  Vitals:   11/17/20 1346 11/17/20 1346 11/17/20 1347 11/17/20 2347  BP:  121/80  (!) 148/80  Pulse:  77  84  Resp:  (!) 22  20  Temp:  98.3 F (36.8 C)    TempSrc:  Oral    SpO2: 100% 100%  95%  Weight:   44.9 kg   Height:   '5\' 6"'$  (1.676 m)   In the emergency room patient is alert awake oriented afebrile oxygenating at 90% on room air, Initial EKG was reported to have no ST-T wave changes however images not scanned currently.  In the emergency room patient received Lasix, DuoNeb, Zofran, Solu-Medrol.  Patient also had a CT angio of her chest which was negative for pulmonary embolism, Positive for cardiomegaly coronary artery disease, stable chronic changes with bronchiectasis and areas of scarring, large hiatal hernia. Blood work today shows a glucose of 100 creatinine is normal at 0.67, initial BNP of 416, troponin trend as follows: Results for ALDEAN, DONAHO" (MRN [id number]) as of  11/17/2020 23:31  11/17/2020 13:50 11/17/2020 14:01 11/17/2020 15:07 11/17/2020 21:30 11/17/2020 22:41  Troponin I (High Sensitivity) 135 (HH)  210 (HH)  703 (HH)  CBC shows white count of 20.6, hemoglobin of 11.6, MCV of 22.3, platelet counts of 274, lactic acid 1.2 and procalcitonin less than 0.10.   Review of Systems:  Review of Systems  Constitutional:  Positive for chills.  HENT: Negative.    Eyes: Negative.   Respiratory:  Positive for cough and shortness of breath.   Cardiovascular:  Positive for chest pain.  Gastrointestinal: Negative.   Genitourinary: Negative.   Musculoskeletal: Negative.   Skin: Negative.   Neurological: Negative.   All other systems reviewed and are negative.   Past Medical History:  Diagnosis Date   Arthritis    Atrial fibrillation ([place])    Bronchiectasis (HCC)    CAD (coronary artery disease) 07/30/2017   CHF (congestive heart failure) (HCC)    Dumping syndrome    Essential hypertension, malignant 10/03/2013   Family history of adverse reaction to anesthesia    sister [name]   GERD (gastroesophageal reflux disease)    Headache    MIGRAINES   Myocardial infarction ([place]) 2007   Non-STEMI   [name] (postoperative nausea and vomiting)    Psoriasis    PUD (peptic ulcer disease)     Past Surgical History:  Procedure Laterality Date   BACK SURGERY     CHOLECYSTECTOMY     ESOPHAGOGASTRODUODENOSCOPY N/A 09/04/2020  Procedure: ESOPHAGOGASTRODUODENOSCOPY (EGD);  Surgeon: Virgel Manifold, MD;  Location: [facility] ENDOSCOPY;  Service: Endoscopy;  Laterality: N/A;   ESOPHAGOGASTRODUODENOSCOPY (EGD) WITH PROPOFOL N/A 03/19/2019   Procedure: ESOPHAGOGASTRODUODENOSCOPY (EGD) WITH PROPOFOL;  Surgeon: [name], MD;  Location: [facility] ENDOSCOPY;  Service: Gastroenterology;  Laterality: N/A;  *Note to anesthesia: Per pt's pulmonologist, if intubating, please extubate to BIPAP.   EYE SURGERY     FOOT SURGERY     INTRAMEDULLARY (IM) NAIL INTERTROCHANTERIC Right  09/30/2018   Procedure: INTRAMEDULLARY (IM) NAIL INTERTROCHANTRIC;  Surgeon: Dereck Leep, MD;  Location: [facility] ORS;  Service: Orthopedics;  Laterality: Right;   KYPHOPLASTY N/A 07/05/2016   Procedure: KYPHOPLASTY T - 9;  Surgeon: Hessie Knows, MD;  Location: [facility] ORS;  Service: Orthopedics;  Laterality: N/A;   KYPHOPLASTY N/A 11/29/2017   Procedure: Iona Hansen;  Surgeon: Hessie Knows, MD;  Location: [facility] ORS;  Service: Orthopedics;  Laterality: N/A;  L2 and L3   KYPHOPLASTY N/A 12/18/2017   Procedure: KYPHOPLASTY L1;  Surgeon: Hessie Knows, MD;  Location: [facility] ORS;  Service: Orthopedics;  Laterality: N/A;   KYPHOPLASTY N/A 01/05/2018   Procedure: KYPHOPLASTY-T11,T12;  Surgeon: Hessie Knows, MD;  Location: [facility] ORS;  Service: Orthopedics;  Laterality: N/A;   KYPHOPLASTY N/A 04/05/2018   Procedure: T10 KYPHOPLASTY;  Surgeon: Hessie Knows, MD;  Location: [facility] ORS;  Service: Orthopedics;  Laterality: N/A;   KYPHOPLASTY N/A 04/12/2018   Procedure: KYPHOPLASTY T7,8;  Surgeon: Hessie Knows, MD;  Location: [facility] ORS;  Service: Orthopedics;  Laterality: N/A;   KYPHOPLASTY N/A 04/19/2018   Procedure: KYPHOPLASTY T5, T6;  Surgeon: Hessie Knows, MD;  Location: [facility] ORS;  Service: Orthopedics;  Laterality: N/A;   LUNG SURGERY  1990 and 1996   THOROCOTOMY WITH LOBECTOMY     LEFT LOWER THORACOTOMY / RIGHT MIDDLE LOBECTOMY     reports that she has never smoked. She has never used smokeless tobacco. She reports that she does not drink alcohol and does not use drugs.  Allergies  Allergen Reactions   Codeine Nausea And Vomiting   Sulfa Antibiotics Diarrhea and Anxiety    "Makes her feel shaky."   Penicillins Rash    Has patient had a PCN reaction causing immediate rash, facial/tongue/throat swelling, SOB or lightheadedness with hypotension: Unknown Has patient had a PCN reaction causing severe rash involving mucus membranes or skin necrosis: Unknown Has patient had a PCN reaction that required  hospitalization: Unknown Has patient had a PCN reaction occurring within the last 10 years: No If all of the above answers are "NO", then may proceed with Cephalosporin use.     Family History  Problem Relation Age of Onset   Hypertension Mother    Hypertension Father     Prior to Admission medications   Medication Sig Start Date End Date Taking? Authorizing Provider  acetaminophen (TYLENOL) 500 MG tablet Take 1,000 mg by mouth every 6 (six) hours as needed for mild pain or headache.     [provider]  acetylcysteine (MUCOMYST) 20 % nebulizer solution Take 4 mLs by nebulization daily. 04/22/20   [provider]  amiodarone (PACERONE) 200 MG tablet Take 1 tablet (200 mg total) by mouth 2 (two) times daily. 08/07/20   Val Riles, MD  apixaban (ELIQUIS) 5 MG TABS tablet Take 1 tablet (5 mg total) by mouth 2 (two) times daily. 09/15/20   Lorella Nimrod, MD  Ascorbic Acid (VITAMIN C) 1000 MG tablet Take 1,000 mg by mouth daily.    [provider]  benzonatate (TESSALON) 200 MG capsule Take 200 mg by mouth 3 (three) times daily as needed. 04/02/20   [provider]  COMBIVENT RESPIMAT 20-100 MCG/ACT AERS respimat 1 puff every 6 (six) hours as needed for wheezing. 06/09/20   [provider]  digoxin (LANOXIN) 0.125 MG tablet Take 125 mcg by mouth daily. 05/11/20   [provider]  ferrous sulfate 325 (65 FE) MG EC tablet Take 325 mg by mouth daily.     [provider]  furosemide (LASIX) 20 MG tablet Take 1 tablet (20 mg total) by mouth daily as needed. Patient taking differently: Take 20 mg by mouth daily as needed. Take as needed for weight gain of 2 lbs overnight or 5 lbs in a week 09/16/20   Darylene Price A, FNP  gabapentin (NEURONTIN) 300 MG capsule Take 300 mg by mouth at bedtime as needed.    [provider]  glycopyrrolate (ROBINUL) 1 MG tablet Take 1 mg by mouth 3 (three) times daily.    [provider]  Infant  Care Products [facility]) OINT Apply 1 application topically as needed. 08/17/20   [provider]  levalbuterol (XOPENEX) 0.31 MG/3ML nebulizer solution Inhale 3 mLs into the lungs every 6 (six) hours as needed. 12/10/19   [provider]  LORazepam (ATIVAN) 0.5 MG tablet Take 1 tablet (0.5 mg total) by mouth at bedtime. 09/10/19   Enzo Bi, MD  losartan (COZAAR) 100 MG tablet Take 100 mg by mouth daily. 08/28/20   [provider]  metoprolol succinate (TOPROL-XL) 100 MG 24 hr tablet Take 100 mg by mouth daily. 08/31/20   [provider]  NAC 600 MG CAPS Take 600 mg by mouth daily after breakfast.  08/17/19   [provider]  omeprazole (PRILOSEC) 20 MG capsule Take 20 mg by mouth daily. 08/26/19   [provider]  polyethylene glycol (MIRALAX / GLYCOLAX) 17 g packet Take 17 g by mouth daily as needed for severe constipation. 05/02/20   Loletha Grayer, MD  potassium chloride (KLOR-CON) 10 MEQ tablet Take 10 mEq by mouth daily. 08/16/20   [provider]  predniSONE (DELTASONE) 10 MG tablet Take 10 mg by mouth daily. 08/07/20   [provider]  Probiotic Product (ALIGN) 4 MG CAPS Take 4 mg by mouth daily.    [provider]  senna (SENOKOT) 8.6 MG TABS tablet Take 2 tablets (17.2 mg total) by mouth at bedtime. Patient taking differently: Take 1-2 tablets by mouth daily as needed. 05/02/20   Loletha Grayer, MD  Sodium Chloride, Inhalant, 7 % NEBU Inhale 4 mLs into the lungs daily.    [provider]    Physical Exam: Vitals:   11/17/20 1346 11/17/20 1346 11/17/20 1347 11/17/20 2347  BP:  121/80  (!) 148/80  Pulse:  77  84  Resp:  (!) 22  20  Temp:  98.3 F (36.8 C)    TempSrc:  Oral    SpO2: 100% 100%  95%  Weight:   44.9 kg   Height:   '5\' 6"'$  (1.676 m)    Physical Exam   Labs on Admission: I have personally reviewed following labs and imaging studies  No results for input(s): CKTOTAL, CKMB, TROPONINI in  the last 72 hours. Lab Results  Component Value Date   WBC 20.6 (H) 11/17/2020   HGB 11.6 (L) 11/17/2020   HCT 36.2 11/17/2020   MCV 102.3 (H) 11/17/2020   PLT 274 11/17/2020    Recent  Labs  Lab 11/17/20 1350  NA 136  K 4.5  CL 93*  CO2 33*  BUN 37*  CREATININE 0.67  CALCIUM 9.4  GLUCOSE 100*   No results found for: CHOL, HDL, LDLCALC, TRIG Lab Results  Component Value Date   DDIMER 0.57 (H) 07/14/2020    COVID-19 Labs No results for input(s): DDIMER, FERRITIN, LDH, CRP in the last 72 hours. Lab Results  Component Value Date   SARSCOV2NAA NEGATIVE 11/17/2020   SARSCOV2NAA POSITIVE (A) 09/13/2020   SARSCOV2NAA POSITIVE (A) 08/24/2020   SARSCOV2NAA POSITIVE (A) 07/14/2020    Radiological Exams on Admission: DG Chest 2 View  Result Date: 11/17/2020 CLINICAL DATA:  Shortness of breath.  Chest pain. EXAM: CHEST - 2 VIEW COMPARISON:  09/13/2020 FINDINGS: Heart size remains mildly enlarged. Chronic atherosclerosis of the aorta. Chronic emphysema and pulmonary scarring with pulmonary resection in the right upper lung and presumed lobectomy on the left. Chronic elevation of the left hemidiaphragm. No sign of acute infiltrate. Multiple previous vertebral augmentations. Right arm PICC tip at the SVC RA junction. IMPRESSION: Previous pulmonary resection is a. chronic elevation of the left hemidiaphragm. Emphysema and pulmonary scarring as seen previously. No active process identified. PICC tip at the SVC RA junction. Electronically Signed   By: Nelson Chimes M.D.   On: 11/17/2020 14:45   CT Angio Chest PE W and/or Wo Contrast  Result Date: 11/17/2020 CLINICAL DATA:  chronic bronchiectasis, on antibiotics, worsening respiratory symptoms, pleuritic chest pains. eval infiltrate, effusion, PE EXAM: CT ANGIOGRAPHY CHEST WITH CONTRAST TECHNIQUE: Multidetector CT imaging of the chest was performed using the standard protocol during bolus administration of intravenous contrast. Multiplanar  CT image reconstructions and MIPs were obtained to evaluate the vascular anatomy. CONTRAST:  82m OMNIPAQUE IOHEXOL 350 MG/ML SOLN COMPARISON:  09/14/2020 FINDINGS: Cardiovascular: No filling defects in the pulmonary arteries to suggest pulmonary emboli. Cardiomegaly. Multi-vessel coronary artery calcifications. Tortuous calcified aorta. No aneurysm. Mediastinum/Nodes: No mediastinal, hilar, or axillary adenopathy. Large hiatal hernia again noted, unchanged. Lungs/Pleura: Postoperative changes bilaterally. Bronchiectasis again seen bilaterally, right greater than left. Areas of scarring in the lungs. No pleural effusion or acute areas of consolidation. Upper Abdomen: Imaging into the upper abdomen demonstrates no acute findings. Musculoskeletal: Multilevel vertebroplasty changes throughout the thoracic and upper lumbar spine, unchanged. Review of the MIP images confirms the above findings. IMPRESSION: No evidence of pulmonary embolus. Cardiomegaly, coronary artery disease. Stable chronic changes in the lungs with bronchiectasis and areas of scarring. Large hiatal hernia. Aortic Atherosclerosis (ICD10-I70.0). Electronically Signed   By: KRolm BaptiseM.D.   On: 11/17/2020 21:47    EKG: Independently reviewed.  Sinus rhythm / artifacts otherwise on comparison to prevision ekg in august.   Echocardiogram 03/2020: IMPRESSIONS   1. Left ventricular ejection fraction, by estimation, is 50 to 55%. The  left ventricle has low normal function. The left ventricle has no regional  wall motion abnormalities. Left ventricular diastolic parameters are  consistent with Grade I diastolic  dysfunction (impaired relaxation).   2. Right ventricular systolic function is normal. The right ventricular  size is normal.   3. The mitral valve is normal in structure. Mild mitral valve  regurgitation. No evidence of mitral stenosis.   4. The aortic valve is normal in structure. Aortic valve regurgitation is  mild. No aortic  stenosis is present.   5. The inferior vena cava is normal in size with greater than 50%  respiratory variability, suggesting right atrial pressure of 3 mmHg.  Assessment/Plan Principal Problem:   Chest pain Active Problems:   Coronary artery disease involving native coronary artery of native heart without angina pectoris   Hyperlipidemia, mixed   NSTEMI (non-ST elevated myocardial infarction) (HCC)   Acute on chronic diastolic CHF (congestive heart failure) (HCC)   Atrial fibrillation, chronic (HCC)   Adult bronchiectasis (HCC)   Essential hypertension   GERD (gastroesophageal reflux disease)   Patient is a 75 year old elderly female admitted to progressive cardiac unit with chest pains and positive troponins with her history of heart disease with aspiration, fall precautions.  Chest pain/CAD/NSTEMI/hyperlipidemia: Will admit patient to PCU with continuous cardiac monitoring and oxygen with vitals. Supplemental oxygen, as needed morphine, as needed nitroglycerin as deemed necessary. Will follow troponin overnight cardiology consult per a.m. team. Patient started on ACS protocol heparin gtt. which we will continue. We will also add statin therapy. We will continue patient on metoprolol, losartan, hold patient's Eliquis.   Acute on chronic diastolic congestive heart failure: We will continue patient on amiodarone, digoxin, losartan, metoprolol. Patient given Lasix in the emergency room and we will continue with Lasix however on a as needed regimen only, to avoid hypotension, acute kidney injury, electrolyte abnormalities. Daily intake and output, daily weights.  Elevated BNP of 416.  Will obtain repeat echocardiogram. We will continue patient on digoxin and obtain a digoxin level. Continue patient on amiodarone and obtain TFTs. No h/o of aspirin per pt it has been years as she was on eliquis, we will therefore cont with heparin only and statin as she ahs had hemoptysis -  scant.  Atrial fibrillation: Patient's Eliquis held and continued on heparin GTT.   Bronchiectasis: Patient started on Levaquin at 250 mg , will continue. Do not suspect infection procalcitonin is negative.,  Suspect white count from steroid therapy that patient was started on. Will monitor QT intervals.  Hypertension: Blood pressure (!) 148/80, pulse 84, temperature 98.3 F (36.8 C), temperature source Oral, resp. rate 20, height '5\' 6"'$  (1.676 m), weight 44.9 kg, SpO2 95 %. We will continue patient on her home regimen metoprolol, losartan. As needed diuretic therapy and follow electrolytes.   GERD: IV PPI therapy. Aspiration precaution  Anemia: Chart review shows anemia since July 30, 2017, suspect anemia of chronic etiology. We will however start patient on IV PPI therapy, type and screen, obtain stool guaiac.    DVT prophylaxis:  Heparin GTT  Code Status:  Full code  Family Communication:  Kingsberry,Larry (Spouse)  [phone] (Mobile)   Disposition Plan:  Home  Consults called:  None  Admission status: Patient   Para Skeans MD [facility] [phone] How to contact the [facility] Attending or Consulting provider [place] or covering provider during after hours [place], for this patient.    Check the care team in [facility] and look for a) attending/consulting [facility] provider listed and b) the [facility] team listed Log into [email or web address] and use [place]'s universal password to access. If you do not have the password, please contact the hospital operator. Locate the [facility] provider you are looking for under [facility] and page to a number that you can be directly reached. If you still have difficulty reaching the provider, please page the [facility] (Director on Call) for the Hospitalists listed on amion for assistance. [email or web address] Password [facility] 11/18/2020, 12:33 AM

## 2020-11-17 NOTE — ED Notes (Signed)
Reviewed pt's cc and results; acuity level changed; reviewed pt's chart with Dr Charlsie Quest; no further orders at this time; will wait for next available exam room

## 2020-11-18 ENCOUNTER — Encounter: Payer: Self-pay | Admitting: Internal Medicine

## 2020-11-18 ENCOUNTER — Other Ambulatory Visit: Payer: Self-pay

## 2020-11-18 ENCOUNTER — Inpatient Hospital Stay
Admit: 2020-11-18 | Discharge: 2020-11-18 | Disposition: A | Discharge status: Home / Self Care | Payer: Medicare HMO | Attending: Internal Medicine | Admitting: Internal Medicine

## 2020-11-18 DIAGNOSIS — E782 Mixed hyperlipidemia: Secondary | ICD-10-CM | POA: Diagnosis present

## 2020-11-18 DIAGNOSIS — I214 Non-ST elevation (NSTEMI) myocardial infarction: Secondary | ICD-10-CM | POA: Diagnosis not present

## 2020-11-18 DIAGNOSIS — K219 Gastro-esophageal reflux disease without esophagitis: Secondary | ICD-10-CM | POA: Diagnosis not present

## 2020-11-18 DIAGNOSIS — J9621 Acute and chronic respiratory failure with hypoxia: Secondary | ICD-10-CM | POA: Diagnosis present

## 2020-11-18 DIAGNOSIS — R64 Cachexia: Secondary | ICD-10-CM | POA: Diagnosis present

## 2020-11-18 DIAGNOSIS — I5033 Acute on chronic diastolic (congestive) heart failure: Secondary | ICD-10-CM | POA: Diagnosis present

## 2020-11-18 DIAGNOSIS — I480 Paroxysmal atrial fibrillation: Secondary | ICD-10-CM | POA: Diagnosis not present

## 2020-11-18 DIAGNOSIS — K2100 Gastro-esophageal reflux disease with esophagitis, without bleeding: Secondary | ICD-10-CM | POA: Diagnosis present

## 2020-11-18 DIAGNOSIS — F419 Anxiety disorder, unspecified: Secondary | ICD-10-CM | POA: Diagnosis present

## 2020-11-18 DIAGNOSIS — I248 Other forms of acute ischemic heart disease: Secondary | ICD-10-CM | POA: Diagnosis present

## 2020-11-18 DIAGNOSIS — D638 Anemia in other chronic diseases classified elsewhere: Secondary | ICD-10-CM | POA: Diagnosis present

## 2020-11-18 DIAGNOSIS — I2510 Atherosclerotic heart disease of native coronary artery without angina pectoris: Secondary | ICD-10-CM | POA: Diagnosis present

## 2020-11-18 DIAGNOSIS — Z66 Do not resuscitate: Secondary | ICD-10-CM | POA: Diagnosis present

## 2020-11-18 DIAGNOSIS — J471 Bronchiectasis with (acute) exacerbation: Secondary | ICD-10-CM | POA: Diagnosis present

## 2020-11-18 DIAGNOSIS — Z8616 Personal history of COVID-19: Secondary | ICD-10-CM | POA: Diagnosis not present

## 2020-11-18 DIAGNOSIS — G894 Chronic pain syndrome: Secondary | ICD-10-CM | POA: Diagnosis present

## 2020-11-18 DIAGNOSIS — Z681 Body mass index (BMI) 19 or less, adult: Secondary | ICD-10-CM | POA: Diagnosis not present

## 2020-11-18 DIAGNOSIS — R0602 Shortness of breath: Secondary | ICD-10-CM | POA: Diagnosis present

## 2020-11-18 DIAGNOSIS — I482 Chronic atrial fibrillation, unspecified: Secondary | ICD-10-CM | POA: Diagnosis present

## 2020-11-18 DIAGNOSIS — I252 Old myocardial infarction: Secondary | ICD-10-CM | POA: Diagnosis not present

## 2020-11-18 DIAGNOSIS — E43 Unspecified severe protein-calorie malnutrition: Secondary | ICD-10-CM | POA: Diagnosis present

## 2020-11-18 DIAGNOSIS — J9811 Atelectasis: Secondary | ICD-10-CM | POA: Diagnosis present

## 2020-11-18 DIAGNOSIS — Z20822 Contact with and (suspected) exposure to covid-19: Secondary | ICD-10-CM | POA: Diagnosis present

## 2020-11-18 DIAGNOSIS — R079 Chest pain, unspecified: Secondary | ICD-10-CM | POA: Diagnosis not present

## 2020-11-18 DIAGNOSIS — K449 Diaphragmatic hernia without obstruction or gangrene: Secondary | ICD-10-CM | POA: Diagnosis present

## 2020-11-18 DIAGNOSIS — M1990 Unspecified osteoarthritis, unspecified site: Secondary | ICD-10-CM | POA: Diagnosis present

## 2020-11-18 DIAGNOSIS — I110 Hypertensive heart disease with heart failure: Secondary | ICD-10-CM | POA: Diagnosis present

## 2020-11-18 DIAGNOSIS — J479 Bronchiectasis, uncomplicated: Secondary | ICD-10-CM | POA: Diagnosis not present

## 2020-11-18 DIAGNOSIS — A312 Disseminated mycobacterium avium-intracellulare complex (DMAC): Secondary | ICD-10-CM | POA: Diagnosis present

## 2020-11-18 DIAGNOSIS — I272 Pulmonary hypertension, unspecified: Secondary | ICD-10-CM | POA: Diagnosis present

## 2020-11-18 LAB — ECHOCARDIOGRAM COMPLETE
AR max vel: 2.11 cm2
AV Area VTI: 2.33 cm2
AV Area mean vel: 2.06 cm2
AV Mean grad: 4 mmHg
AV Peak grad: 7.3 mmHg
Ao pk vel: 1.35 m/s
Area-P 1/2: 6.83 cm2
Height: 66 in
S' Lateral: 2.6 cm
Weight: 1584 [oz_av]

## 2020-11-18 LAB — TSH: TSH: 1.554 u[IU]/mL (ref 0.350–4.500)

## 2020-11-18 LAB — APTT
aPTT: 35 seconds (ref 24–36)
aPTT: 49 seconds — ABNORMAL HIGH (ref 24–36)
aPTT: 62 seconds — ABNORMAL HIGH (ref 24–36)

## 2020-11-18 LAB — DIGOXIN LEVEL: Digoxin Level: 0.5 ng/mL — ABNORMAL LOW (ref 0.8–2.0)

## 2020-11-18 LAB — HEPARIN LEVEL (UNFRACTIONATED): Heparin Unfractionated: >1.1 IU/mL — ABNORMAL HIGH (ref 0.30–0.70)

## 2020-11-18 LAB — TROPONIN I (HIGH SENSITIVITY)
Troponin I (High Sensitivity): 518 ng/L (ref <18)
Troponin I (High Sensitivity): 562 ng/L (ref <18)
Troponin I (High Sensitivity): 536 ng/L (ref <18)

## 2020-11-18 LAB — T4, FREE: Free T4: 1.33 ng/dL — ABNORMAL HIGH (ref 0.61–1.12)

## 2020-11-18 LAB — PROCALCITONIN: Procalcitonin: <0.1 ng/mL

## 2020-11-18 LAB — LACTIC ACID, PLASMA: Lactic Acid, Venous: 3.1 mmol/L (ref 0.5–1.9)

## 2020-11-18 MED ORDER — ASCORBIC ACID 500 MG PO TABS
1000.0000 mg | ORAL_TABLET | Freq: Every day | ORAL | Status: DC
  Administered 2020-11-18 – 2020-11-20 (×2): 1000 mg via ORAL
  Filled 2020-11-18 (×2): qty 2

## 2020-11-18 MED ORDER — IPRATROPIUM-ALBUTEROL 0.5-2.5 (3) MG/3ML IN SOLN
6.0000 mL | Freq: Four times a day (QID) | RESPIRATORY_TRACT | Status: DC | PRN
  Administered 2020-11-18 – 2020-11-20 (×4): 6 mL via RESPIRATORY_TRACT
  Filled 2020-11-18 (×3): qty 3
  Filled 2020-11-18: qty 6
  Filled 2020-11-18: qty 3

## 2020-11-18 MED ORDER — DIGOXIN 125 MCG PO TABS: 125.0000 ug | ORAL_TABLET | Freq: Every day | ORAL | Status: DC

## 2020-11-18 MED ORDER — ATORVASTATIN CALCIUM 20 MG PO TABS
40.0000 mg | ORAL_TABLET | Freq: Every day | ORAL | Status: DC
  Administered 2020-11-18 – 2020-11-21 (×3): 40 mg via ORAL
  Filled 2020-11-18 (×3): qty 2

## 2020-11-18 MED ORDER — ACETAMINOPHEN 325 MG PO TABS
650.0000 mg | ORAL_TABLET | ORAL | Status: DC | PRN
  Administered 2020-11-18 (×4): 650 mg via ORAL
  Filled 2020-11-18 (×4): qty 2

## 2020-11-18 MED ORDER — ACETYLCYSTEINE 20 % IN SOLN: 4.0000 mL | Freq: Every day | RESPIRATORY_TRACT | Status: DC

## 2020-11-18 MED ORDER — GLYCOPYRROLATE 1 MG PO TABS: 1.0000 mg | ORAL_TABLET | Freq: Three times a day (TID) | ORAL | Status: DC

## 2020-11-18 MED ORDER — LEVOFLOXACIN 500 MG PO TABS: 500.0000 mg | ORAL_TABLET | Freq: Once | ORAL | Status: DC

## 2020-11-18 MED ORDER — PANTOPRAZOLE SODIUM 40 MG IV SOLR
40.0000 mg | Freq: Two times a day (BID) | INTRAVENOUS | Status: DC
  Administered 2020-11-18 (×2): 40 mg via INTRAVENOUS
  Filled 2020-11-18 (×2): qty 40

## 2020-11-18 MED ORDER — SUCRALFATE 1 GM/10ML PO SUSP
1.0000 g | Freq: Four times a day (QID) | ORAL | Status: DC
  Administered 2020-11-18 – 2020-11-21 (×11): 1 g via ORAL
  Filled 2020-11-18 (×12): qty 10

## 2020-11-18 MED ORDER — ACETYLCYSTEINE 20 % IN SOLN
4.0000 mL | Freq: Every day | RESPIRATORY_TRACT | Status: DC
  Administered 2020-11-20 – 2020-11-21 (×2): 4 mL via RESPIRATORY_TRACT
  Filled 2020-11-18 (×5): qty 4

## 2020-11-18 MED ORDER — GABAPENTIN 300 MG PO CAPS
300.0000 mg | ORAL_CAPSULE | Freq: Every evening | ORAL | Status: DC | PRN
  Administered 2020-11-18 – 2020-11-20 (×3): 300 mg via ORAL
  Filled 2020-11-18 (×3): qty 1

## 2020-11-18 MED ORDER — METOPROLOL SUCCINATE ER 50 MG PO TB24: 25.0000 mg | ORAL_TABLET | Freq: Every day | ORAL | Status: DC

## 2020-11-18 MED ORDER — LEVALBUTEROL HCL 0.63 MG/3ML IN NEBU: 1.5000 mL | INHALATION_SOLUTION | Freq: Four times a day (QID) | RESPIRATORY_TRACT | Status: DC | PRN

## 2020-11-18 MED ORDER — ASPIRIN EC 81 MG PO TBEC
81.0000 mg | DELAYED_RELEASE_TABLET | Freq: Every day | ORAL | Status: DC
  Administered 2020-11-19 – 2020-11-21 (×3): 81 mg via ORAL
  Filled 2020-11-18 (×3): qty 1

## 2020-11-18 MED ORDER — MORPHINE SULFATE (PF) 2 MG/ML IV SOLN: 1.0000 mg | INTRAVENOUS | Status: DC | PRN

## 2020-11-18 MED ORDER — LOSARTAN POTASSIUM 50 MG PO TABS
100.0000 mg | ORAL_TABLET | Freq: Every day | ORAL | Status: DC
  Administered 2020-11-18 – 2020-11-21 (×3): 100 mg via ORAL
  Filled 2020-11-18 (×3): qty 2

## 2020-11-18 MED ORDER — HEPARIN BOLUS VIA INFUSION
1300.0000 [IU] | Freq: Once | INTRAVENOUS | Status: AC
  Administered 2020-11-18: 1300 [IU] via INTRAVENOUS
  Filled 2020-11-18: qty 1300

## 2020-11-18 MED ORDER — CHLORHEXIDINE GLUCONATE CLOTH 2 % EX PADS
6.0000 | MEDICATED_PAD | Freq: Every day | CUTANEOUS | Status: DC
  Administered 2020-11-20 – 2020-11-21 (×2): 6 via TOPICAL
  Filled 2020-11-18 (×2): qty 6

## 2020-11-18 MED ORDER — NITROGLYCERIN 0.4 MG SL SUBL: 0.4000 mg | SUBLINGUAL_TABLET | SUBLINGUAL | Status: DC | PRN

## 2020-11-18 MED ORDER — PANTOPRAZOLE SODIUM 40 MG PO TBEC
40.0000 mg | DELAYED_RELEASE_TABLET | Freq: Two times a day (BID) | ORAL | Status: DC
  Administered 2020-11-18 – 2020-11-21 (×5): 40 mg via ORAL
  Filled 2020-11-18 (×4): qty 1

## 2020-11-18 MED ORDER — SENNA 8.6 MG PO TABS
2.0000 | ORAL_TABLET | Freq: Every day | ORAL | Status: DC
  Administered 2020-11-18 – 2020-11-20 (×3): 17.2 mg via ORAL
  Filled 2020-11-18 (×3): qty 2

## 2020-11-18 MED ORDER — IPRATROPIUM-ALBUTEROL 20-100 MCG/ACT IN AERS: 1.0000 | INHALATION_SPRAY | Freq: Four times a day (QID) | RESPIRATORY_TRACT | Status: DC | PRN

## 2020-11-18 MED ORDER — AMIODARONE HCL 200 MG PO TABS
200.0000 mg | ORAL_TABLET | Freq: Two times a day (BID) | ORAL | Status: DC
  Administered 2020-11-18 – 2020-11-21 (×7): 200 mg via ORAL
  Filled 2020-11-18 (×8): qty 1

## 2020-11-18 MED ORDER — ADULT MULTIVITAMIN W/MINERALS CH
1.0000 | ORAL_TABLET | Freq: Every day | ORAL | Status: DC
  Administered 2020-11-20 – 2020-11-21 (×2): 1 via ORAL
  Filled 2020-11-18 (×2): qty 1

## 2020-11-18 MED ORDER — METOPROLOL SUCCINATE ER 25 MG PO TB24
25.0000 mg | ORAL_TABLET | Freq: Every day | ORAL | Status: DC
  Administered 2020-11-18 – 2020-11-21 (×4): 25 mg via ORAL
  Filled 2020-11-18 (×4): qty 1

## 2020-11-18 MED ORDER — SODIUM CHLORIDE 0.9% FLUSH: 10.0000 mL | INTRAVENOUS | Status: DC | PRN

## 2020-11-18 MED ORDER — LORAZEPAM 0.5 MG PO TABS
0.5000 mg | ORAL_TABLET | Freq: Every day | ORAL | Status: DC
  Administered 2020-11-18 – 2020-11-20 (×4): 0.5 mg via ORAL
  Filled 2020-11-18 (×4): qty 1

## 2020-11-18 MED ORDER — MORPHINE SULFATE (PF) 2 MG/ML IV SOLN: 0.5000 mg | Freq: Four times a day (QID) | INTRAVENOUS | Status: DC | PRN

## 2020-11-18 MED ORDER — FUROSEMIDE 40 MG PO TABS
40.0000 mg | ORAL_TABLET | Freq: Every day | ORAL | Status: DC
  Administered 2020-11-18 – 2020-11-21 (×3): 40 mg via ORAL
  Filled 2020-11-18 (×3): qty 1

## 2020-11-18 MED ORDER — PREDNISONE 10 MG PO TABS: 10.0000 mg | ORAL_TABLET | Freq: Every day | ORAL | Status: DC

## 2020-11-18 MED ORDER — DIGOXIN 125 MCG PO TABS
62.5000 ug | ORAL_TABLET | Freq: Every day | ORAL | Status: DC
  Administered 2020-11-18 – 2020-11-21 (×3): 62.5 ug via ORAL
  Filled 2020-11-18 (×4): qty 0.5

## 2020-11-18 MED ORDER — SODIUM CHLORIDE 0.9% FLUSH
3.0000 mL | Freq: Two times a day (BID) | INTRAVENOUS | Status: DC
  Administered 2020-11-18: 10 mL via INTRAVENOUS
  Administered 2020-11-18 – 2020-11-21 (×5): 3 mL via INTRAVENOUS

## 2020-11-18 MED ORDER — LEVOFLOXACIN IN D5W 750 MG/150ML IV SOLN: 750.0000 mg | INTRAVENOUS | Status: DC

## 2020-11-18 MED ORDER — LEVALBUTEROL HCL 0.63 MG/3ML IN NEBU: 0.6300 mg | INHALATION_SOLUTION | Freq: Four times a day (QID) | RESPIRATORY_TRACT | Status: DC | PRN

## 2020-11-18 MED ORDER — LEVOFLOXACIN 500 MG PO TABS
250.0000 mg | ORAL_TABLET | Freq: Every day | ORAL | Status: DC
  Administered 2020-11-18: 250 mg via ORAL
  Filled 2020-11-18: qty 1

## 2020-11-18 MED ORDER — METHYLPREDNISOLONE SODIUM SUCC 125 MG IJ SOLR: 80.0000 mg | INTRAMUSCULAR | Status: DC

## 2020-11-18 MED ORDER — BENZONATATE 100 MG PO CAPS
200.0000 mg | ORAL_CAPSULE | Freq: Three times a day (TID) | ORAL | Status: DC | PRN
  Administered 2020-11-19 – 2020-11-20 (×2): 200 mg via ORAL
  Filled 2020-11-18 (×2): qty 2

## 2020-11-18 MED ORDER — SODIUM CHLORIDE 0.9% FLUSH
10.0000 mL | Freq: Two times a day (BID) | INTRAVENOUS | Status: DC
  Administered 2020-11-18: 10 mL
  Administered 2020-11-18: 20 mL
  Administered 2020-11-19 – 2020-11-21 (×4): 10 mL

## 2020-11-18 MED ORDER — METHYLPREDNISOLONE SODIUM SUCC 125 MG IJ SOLR
80.0000 mg | Freq: Two times a day (BID) | INTRAMUSCULAR | Status: DC
  Administered 2020-11-18 (×2): 80 mg via INTRAVENOUS
  Filled 2020-11-18 (×2): qty 2

## 2020-11-18 MED ORDER — FERROUS SULFATE 325 (65 FE) MG PO TABS
325.0000 mg | ORAL_TABLET | Freq: Every day | ORAL | Status: DC
  Administered 2020-11-20 – 2020-11-21 (×2): 325 mg via ORAL
  Filled 2020-11-18 (×2): qty 1

## 2020-11-18 MED ORDER — ACETYLCYSTEINE 600 MG PO CAPS: 600.0000 mg | ORAL_CAPSULE | Freq: Every day | ORAL | Status: DC

## 2020-11-18 NOTE — Progress Notes (Signed)
[facility] NAME: Ana Washington    MR#:  [id number]  DATE OF BIRTH:  Dec 03, 1945  SUBJECTIVE:  patient came in secondary to increasing cough with productive phlegm. She was seen outpatient in the pulmonary office and started on oral antibiotic and prednisone. Patient has chronic bronchiectasis continues to cough or green/yellowish phlegm. She has some chest tightness and noted to have elevated troponin currently on IV heparin drip  currently is also getting IV TPN which was started at [facility] about 7 to 8 weeks ago. She has a PICC line and right upper extremity  no family at bedside.  REVIEW OF SYSTEMS:   Review of Systems  Constitutional:  Negative for chills, fever and weight loss.  HENT:  Negative for ear discharge, ear pain and nosebleeds.   Eyes:  Negative for blurred vision, pain and discharge.  Respiratory:  Positive for cough, sputum production and shortness of breath. Negative for wheezing and stridor.   Cardiovascular:  Negative for chest pain, palpitations, orthopnea and PND.  Gastrointestinal:  Negative for abdominal pain, diarrhea, nausea and vomiting.  Genitourinary:  Negative for frequency and urgency.  Musculoskeletal:  Negative for back pain and joint pain.  Neurological:  Positive for weakness. Negative for sensory change, speech change and focal weakness.  Psychiatric/Behavioral:  Negative for depression and hallucinations. The patient is not nervous/anxious.   Tolerating Diet:yes Tolerating PT:   DRUG ALLERGIES:   Allergies  Allergen Reactions  . Codeine Nausea And Vomiting  . Sulfa Antibiotics Diarrhea and Anxiety    "Makes her feel shaky."  . Penicillins Rash    Has patient had a PCN reaction causing immediate rash, facial/tongue/throat swelling, SOB or lightheadedness with hypotension: Unknown Has patient had a PCN reaction causing severe rash involving mucus membranes or skin necrosis: Unknown Has patient  had a PCN reaction that required hospitalization: Unknown Has patient had a PCN reaction occurring within the last 10 years: No If all of the above answers are "NO", then may proceed with Cephalosporin use.     VITALS:  Blood pressure (!) 146/79, pulse 65, temperature 98.3 F (36.8 C), temperature source Oral, resp. rate 18, height _0  (1.676 m), weight 44.9 kg, SpO2 100 %.  PHYSICAL EXAMINATION:   Physical Exam  GENERAL:  75 y.o.-year-old patient lying in the bed with no acute distress.   LUNGS: Normal breath sounds bilaterally, no wheezing, rales, rhonchi. No use of accessory muscles of respiration.  CARDIOVASCULAR: S1, S2 normal. No murmurs, rubs, or gallops.  ABDOMEN: Soft, nontender, nondistended. Bowel sounds present. No organomegaly or mass.  EXTREMITIES: No cyanosis, clubbing or edema b/l.   Right UE PICC + NEUROLOGIC: Cranial nerves II through XII are intact. No focal Motor or sensory deficits b/l.   PSYCHIATRIC:  patient is alert and oriented x 3.  SKIN: No obvious rash, lesion, or ulcer.   LABORATORY PANEL:  CBC Recent Labs  Lab 11/17/20 1350  WBC 20.6*  HGB 11.6*  HCT 36.2  PLT 274    Chemistries  Recent Labs  Lab 11/17/20 1350  NA 136  K 4.5  CL 93*  CO2 33*  GLUCOSE 100*  BUN 37*  CREATININE 0.67  CALCIUM 9.4   Cardiac Enzymes No results for input(s): TROPONINI in the last 168 hours. RADIOLOGY:  DG Chest 2 View  Result Date: 11/17/2020 CLINICAL DATA:  Shortness of breath.  Chest pain. EXAM: CHEST - 2 VIEW COMPARISON:  09/13/2020 FINDINGS: Heart size  remains mildly enlarged. Chronic atherosclerosis of the aorta. Chronic emphysema and pulmonary scarring with pulmonary resection in the right upper lung and presumed lobectomy on the left. Chronic elevation of the left hemidiaphragm. No sign of acute infiltrate. Multiple previous vertebral augmentations. Right arm PICC tip at the SVC RA junction. IMPRESSION: Previous pulmonary resection is a. chronic  elevation of the left hemidiaphragm. Emphysema and pulmonary scarring as seen previously. No active process identified. PICC tip at the SVC RA junction. Electronically Signed   By: Nelson Chimes M.D.   On: 11/17/2020 14:45   CT Angio Chest PE W and/or Wo Contrast  Result Date: 11/17/2020 CLINICAL DATA:  chronic bronchiectasis, on antibiotics, worsening respiratory symptoms, pleuritic chest pains. eval infiltrate, effusion, PE EXAM: CT ANGIOGRAPHY CHEST WITH CONTRAST TECHNIQUE: Multidetector CT imaging of the chest was performed using the standard protocol during bolus administration of intravenous contrast. Multiplanar CT image reconstructions and MIPs were obtained to evaluate the vascular anatomy. CONTRAST:  91m OMNIPAQUE IOHEXOL 350 MG/ML SOLN COMPARISON:  09/14/2020 FINDINGS: Cardiovascular: No filling defects in the pulmonary arteries to suggest pulmonary emboli. Cardiomegaly. Multi-vessel coronary artery calcifications. Tortuous calcified aorta. No aneurysm. Mediastinum/Nodes: No mediastinal, hilar, or axillary adenopathy. Large hiatal hernia again noted, unchanged. Lungs/Pleura: Postoperative changes bilaterally. Bronchiectasis again seen bilaterally, right greater than left. Areas of scarring in the lungs. No pleural effusion or acute areas of consolidation. Upper Abdomen: Imaging into the upper abdomen demonstrates no acute findings. Musculoskeletal: Multilevel vertebroplasty changes throughout the thoracic and upper lumbar spine, unchanged. Review of the MIP images confirms the above findings. IMPRESSION: No evidence of pulmonary embolus. Cardiomegaly, coronary artery disease. Stable chronic changes in the lungs with bronchiectasis and areas of scarring. Large hiatal hernia. Aortic Atherosclerosis (ICD10-I70.0). Electronically Signed   By: KRolm BaptiseM.D.   On: 11/17/2020 21:47   ECHOCARDIOGRAM COMPLETE  Result Date: 11/18/2020    ECHOCARDIOGRAM REPORT   Patient Name:   Ana GJACE DOWEDate of  Exam: 11/18/2020 Medical Rec #:  [id number]      Height:       66.0 in Accession #:    [id number]     Weight:       99.0 lb Date of Birth:  705/24/1947      BSA:          1.483 m Patient Age:    89years        BP:           132/68 mmHg Patient Gender: F               HR:           70 bpm. Exam Location:  [facility] Procedure: 2D Echo, Cardiac Doppler and Color Doppler Indications:     CHF-acute diastolic IN23.55 History:         Patient has prior history of Echocardiogram examinations, most                  recent 04/30/2020. Previous Myocardial Infarction,                  Arrythmias:Atrial Fibrillation; Risk Factors:Hypertension.  Sonographer:     JSherrie SportReferring Phys:  AUintahDiagnosing Phys: BSerafina RoyalsMD  Sonographer Comments: Suboptimal apical window. IMPRESSIONS  1. Left ventricular ejection fraction, by estimation, is 50 to 55%. The left ventricle has low normal function. The left ventricle has no regional wall motion abnormalities. Left ventricular diastolic parameters were  normal.  2. Right ventricular systolic function is normal. The right ventricular size is normal.  3. The mitral valve is normal in structure. Mild mitral valve regurgitation.  4. The aortic valve is normal in structure. Aortic valve regurgitation is mild. FINDINGS  Left Ventricle: Left ventricular ejection fraction, by estimation, is 50 to 55%. The left ventricle has low normal function. The left ventricle has no regional wall motion abnormalities. The left ventricular internal cavity size was normal in size. There is no left ventricular hypertrophy. Left ventricular diastolic parameters were normal. Right Ventricle: The right ventricular size is normal. No increase in right ventricular wall thickness. Right ventricular systolic function is normal. Left Atrium: Left atrial size was normal in size. Right Atrium: Right atrial size was normal in size. Pericardium: There is no evidence of pericardial effusion. Mitral Valve:  The mitral valve is normal in structure. Mild mitral valve regurgitation. Tricuspid Valve: The tricuspid valve is normal in structure. Tricuspid valve regurgitation is mild. Aortic Valve: The aortic valve is normal in structure. Aortic valve regurgitation is mild. Aortic valve mean gradient measures 4.0 mmHg. Aortic valve peak gradient measures 7.3 mmHg. Aortic valve area, by VTI measures 2.33 cm. Pulmonic Valve: The pulmonic valve was normal in structure. Pulmonic valve regurgitation is trivial. Aorta: The aortic root and ascending aorta are structurally normal, with no evidence of dilitation. IAS/Shunts: No atrial level shunt detected by color flow Doppler.  LEFT VENTRICLE PLAX 2D LVIDd:         3.55 cm   Diastology LVIDs:         2.60 cm   LV e' medial:    3.70 cm/s LV PW:         0.86 cm   LV E/e' medial:  16.7 LV IVS:        0.99 cm   LV e' lateral:   4.90 cm/s LVOT diam:     2.00 cm   LV E/e' lateral: 12.6 LV SV:         57 LV SV Index:   39 LVOT Area:     3.14 cm  RIGHT VENTRICLE RV Basal diam:  3.17 cm RV S prime:     14.10 cm/s TAPSE (M-mode): 3.8 cm LEFT ATRIUM             Index        RIGHT ATRIUM           Index LA diam:        2.30 cm 1.55 cm/m   RA Area:     20.50 cm LA Vol (A2C):   70.9 ml 47.80 ml/m  RA Volume:   62.40 ml  42.07 ml/m LA Vol (A4C):   54.3 ml 36.61 ml/m LA Biplane Vol: 61.8 ml 41.67 ml/m  AORTIC VALVE                    PULMONIC VALVE AV Area (Vmax):    2.11 cm     PV Vmax:        0.80 m/s AV Area (Vmean):   2.06 cm     PV Peak grad:   2.6 mmHg AV Area (VTI):     2.33 cm     RVOT Peak grad: 3 mmHg AV Vmax:           135.00 cm/s AV Vmean:          90.100 cm/s AV VTI:  0.247 m AV Peak Grad:      7.3 mmHg AV Mean Grad:      4.0 mmHg LVOT Vmax:         90.70 cm/s LVOT Vmean:        59.000 cm/s LVOT VTI:          0.183 m LVOT/AV VTI ratio: 0.74  AORTA Ao Root diam: 3.00 cm MITRAL VALVE                TRICUSPID VALVE MV Area (PHT): 6.83 cm     TR Peak grad:   12.7 mmHg  MV Decel Time: 111 msec     TR Vmax:        178.00 cm/s MV E velocity: 61.70 cm/s MV A velocity: 100.00 cm/s  SHUNTS MV E/A ratio:  0.62         Systemic VTI:  0.18 m                             Systemic Diam: 2.00 cm Serafina Royals MD Electronically signed by Serafina Royals MD Signature Date/Time: 11/18/2020/1:30:01 PM    Final    ASSESSMENT AND PLAN:  Ana Washington is a 75 y.o. 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  Chest pain with history of CAD/?NSTEMI -- on IV heparin drip -- cardiology consultation with Dr. Nehemiah Massed-- informed via secure chat -- hold eliquis -- continue losartan, beta-blockers, statins  large hiatal hernia-- symptomatic history of TPN -- patient is scheduled to have surgery on November 1 at [facility] -- she currently is getting IV TPN through PICC line by [facility] for last 7 to 8 weeks to improve her nourishment prior to major surgery -- dietitian and pharmacy consultation for TPN  Gerd/reflux esophagitis -- continue PPI and Carafate  chronic atrial fibrillation on chronic anticoagulation -- continue metoprolol -- hold eliquis since patient was on IV heparin drip      Procedures: Family communication : none Consults : pulmonary CODE STATUS: DNR prior to admission DVT Prophylaxis : heparin drip Level of care: Progressive Cardiac Status is: Inpatient  Remains inpatient appropriate because: IV antibiotics for bronchiectasis and workup for non-STEMI        TOTAL TIME TAKING CARE OF THIS PATIENT: 30 minutes.  >50% time spent on  counselling and coordination of care  Note: This dictation was prepared with Dragon dictation along with smaller phrase technology. Any transcriptional errors that result from this process are unintentional.  Fritzi Mandes M.D    [facility]   CC: Primary care physician; [name], MD Patient ID: Ammie Ferrier, female   DOB: Nov 21, 1945, 75 y.o.   MRN: [id number]

## 2020-11-18 NOTE — Progress Notes (Signed)
[place] for heparin infusion  Indication: ACS/STEMI  Allergies  Allergen Reactions   Codeine Nausea And Vomiting   Sulfa Antibiotics Diarrhea and Anxiety    "Makes her feel shaky."   Penicillins Rash    Has patient had a PCN reaction causing immediate rash, facial/tongue/throat swelling, SOB or lightheadedness with hypotension: Unknown Has patient had a PCN reaction causing severe rash involving mucus membranes or skin necrosis: Unknown Has patient had a PCN reaction that required hospitalization: Unknown Has patient had a PCN reaction occurring within the last 10 years: No If all of the above answers are "NO", then may proceed with Cephalosporin use.     Patient Measurements: Height: '5\' 6"'$  (167.6 cm) Weight: 44.9 kg (99 lb) IBW/kg (Calculated) : 59.3 Heparin Dosing Weight: 44.9 kg  Vital Signs: BP: 132/68 (10/19 0700) Pulse Rate: 70 (10/19 0700)  Labs: Recent Labs    11/17/20 1350 11/17/20 1507 11/17/20 2241 11/17/20 2355 11/18/20 0149 11/18/20 0643 11/18/20 0841  HGB 11.6*  --   --   --   --   --   --   HCT 36.2  --   --   --   --   --   --   PLT 274  --   --   --   --   --   --   APTT  --   --   --  35  --   --  49*  LABPROT 14.4  --   --   --   --   --   --   INR 1.1  --   --   --   --   --   --   HEPARINUNFRC  --   --   --  >1.10*  --   --   --   CREATININE 0.67  --   --   --   --   --   --   TROPONINIHS 135*   < > 703*  --  518* 562*  --    < > = values in this interval not displayed.     Estimated Creatinine Clearance: 42.4 mL/min (by C-G formula based on SCr of 0.67 mg/dL).   Medical History: Past Medical History:  Diagnosis Date   Arthritis    Atrial fibrillation (HCC)    Bronchiectasis (HCC)    CAD (coronary artery disease) 07/30/2017   CHF (congestive heart failure) (HCC)    Dumping syndrome    Essential hypertension, malignant 10/03/2013   Family history of adverse reaction to anesthesia    sister  [name]   GERD (gastroesophageal reflux disease)    Headache    MIGRAINES   Myocardial infarction ([place]) 2007   Non-STEMI   [name] (postoperative nausea and vomiting)    Psoriasis    PUD (peptic ulcer disease)     Medications:  PTA Meds:  Eliquis 5 mg BID   Assessment: Pt is 75 yo female with h/o Afib on Eliquis, c/o chest and back pain found with elevated troponin I, trending up 135 >> 703.   Date/Time aPTT/HL Rate 10/19'@0841'$  49 sec  550u/hr, subtherapeutic  Goal of Therapy:  Heparin level 0.3-0.7 units/ml aPTT 66-102 seconds Monitor platelets by anticoagulation protocol: Yes   Plan:  Subtherapeutic Ordered bolus of 1300 units x 1 Increase heparin infusion to 700 unit/hr DOAC PTA:  Will follow aPTT until correlation w/ HL Will check aPTT 8 hr after start of infusion Daily HL  and CBC while on heparin.  Pearla Dubonnet, PharmD Clinical Pharmacist 11/18/2020 9:29 AM

## 2020-11-18 NOTE — Plan of Care (Signed)
Contacted Dr. With critical Lactic result: 3.1

## 2020-11-18 NOTE — Progress Notes (Signed)
ANTICOAGULATION CONSULT NOTE   Pharmacy Consult for heparin infusion  Indication: ACS/STEMI  Patient Measurements: Height: '5\' 6"'$  (167.6 cm) Weight: 44.9 kg (99 lb) IBW/kg (Calculated) : 59.3 Heparin Dosing Weight: 44.9 kg  Vital Signs: BP: 172/87 (10/19 1100) Pulse Rate: 81 (10/19 1100)  Labs: Recent Labs    11/17/20 1350 11/17/20 1507 11/17/20 2355 11/18/20 0149 11/18/20 0643 11/18/20 0841  HGB 11.6*  --   --   --   --   --   HCT 36.2  --   --   --   --   --   PLT 274  --   --   --   --   --   APTT  --   --  35  --   --  49*  LABPROT 14.4  --   --   --   --   --   INR 1.1  --   --   --   --   --   HEPARINUNFRC  --   --  >1.10*  --   --   --   CREATININE 0.67  --   --   --   --   --   TROPONINIHS 135*   < >  --  518* 562* 536*   < > = values in this interval not displayed.     Estimated Creatinine Clearance: 42.4 mL/min (by C-G formula based on SCr of 0.67 mg/dL).   Medical History: Past Medical History:  Diagnosis Date   Arthritis    Atrial fibrillation (HCC)    Bronchiectasis (HCC)    CAD (coronary artery disease) 07/30/2017   CHF (congestive heart failure) (HCC)    Dumping syndrome    Essential hypertension, malignant 10/03/2013   Family history of adverse reaction to anesthesia    sister [name]   GERD (gastroesophageal reflux disease)    Headache    MIGRAINES   Myocardial infarction ([place]) 2007   Non-STEMI   [name] (postoperative nausea and vomiting)    Psoriasis    PUD (peptic ulcer disease)     Medications:  PTA Meds:  Eliquis 5 mg BID   Assessment: Pt is 75 yo female with h/o Afib on Eliquis, c/o chest and back pain found with elevated troponin I. Plan for right and left cardiac catheterization tomorrow for evaluation of possible acute coronary syndrome per cardiology.  Goal of Therapy:  Heparin level 0.3-0.7 units/ml aPTT 66-102 seconds Monitor platelets by anticoagulation protocol: Yes   Plan:  aPTT remains slightly subtherapeutic despite  previous rate increase:  Ordered heparin bolus of 1300 units x 1, then increase heparin infusion rate to 800 unit/hr DOAC PTA:  Will follow aPTT until correlation w/ HL Will check aPTT 8 hours after rate change Daily HL and CBC while on heparin.  Dallie Piles, PharmD Clinical Pharmacist 11/18/2020 12:53 PM

## 2020-11-18 NOTE — Progress Notes (Signed)
Patient completed home dose of TPN. Infusion stopped and PICC flushed. Per dietician, TPN will be resumed tomorrow around 1800 and sourced by [place].

## 2020-11-18 NOTE — Consult Note (Signed)
PHARMACY NOTE:  ANTIMICROBIAL RENAL DOSAGE ADJUSTMENT  Current antimicrobial regimen includes a mismatch between antimicrobial dosage and estimated renal function.  As per policy approved by the Pharmacy & Therapeutics and Medical Executive Committees, the antimicrobial dosage will be adjusted accordingly.  Current antimicrobial dosage:  levofloxacin 250 mg daily  Indication: CAP  Renal Function:  Estimated Creatinine Clearance: 42.4 mL/min (by C-G formula based on SCr of 0.67 mg/dL). '[]'$      On intermittent HD, scheduled: '[]'$      On CRRT    Antimicrobial dosage has been changed to:  levofloxacin 750 mg q48H.   Additional comments:   Thank you for allowing pharmacy to be a part of this patient's care.  Oswald Hillock, [facility] 11/18/2020 2:24 PM

## 2020-11-18 NOTE — Consult Note (Addendum)
[facility] Consultation Note  Patient ID: Ana Washington, MRN: [id number], DOB/AGE: 76-Jan-1947 76 y.o. Admit date: 11/17/2020   Date of Consult: 11/18/2020 Primary Physician: [name], MD Primary Cardiologist: Dr. Ubaldo Glassing  Chief Complaint:  Chief Complaint  Patient presents with   Chest Pain   Shortness of Breath   Reason for Consult:  Chest pain with elevated troponin  HPI: 75 y.o. female with a past medical history of coronary artery disease, CHF, atrial fibrillation on Eliquis, peptic ulcer disease, hypertension.  Patient presented to the ED with complaints of chest pain, shortness of breath that began yesterday.  Patient states that the chest pain is more of a tightness that she feels across the front of her chest and has been occurring for approximately 2 weeks.  Patient has been seen by her pulmonologist and treated for pneumonia.  Patient states that her chest pain recurred yesterday and felt different than normal pulmonary problems that she has.  The pain is in the middle of her chest and went up to her back with no radiation to the left arm or the jaw.  No other associated symptoms aside from shortness of breath.  Initial EKG was nonischemic with no ST-T wave abnormalities.  CTA was negative for pulmonary embolism.  Initial troponin of 135 trending up to 703 at the peak and now in the 500s.   Patient currently states that she still has some chest discomfort but it is improved from when she was first seen.  Patient also has some shortness of breath and is currently on oxygen for which she normally has home oxygen.  Past Medical History:  Diagnosis Date   Arthritis    Atrial fibrillation ([place])    Bronchiectasis (HCC)    CAD (coronary artery disease) 07/30/2017   CHF (congestive heart failure) (HCC)    Dumping syndrome    Essential hypertension, malignant 10/03/2013   Family history of adverse reaction to anesthesia    sister [name]   GERD (gastroesophageal  reflux disease)    Headache    MIGRAINES   Myocardial infarction ([place]) 2007   Non-STEMI   [name] (postoperative nausea and vomiting)    Psoriasis    PUD (peptic ulcer disease)       Surgical History:  Past Surgical History:  Procedure Laterality Date   BACK SURGERY     CHOLECYSTECTOMY     ESOPHAGOGASTRODUODENOSCOPY N/A 09/04/2020   Procedure: ESOPHAGOGASTRODUODENOSCOPY (EGD);  Surgeon: Virgel Manifold, MD;  Location: [facility] ENDOSCOPY;  Service: Endoscopy;  Laterality: N/A;   ESOPHAGOGASTRODUODENOSCOPY (EGD) WITH PROPOFOL N/A 03/19/2019   Procedure: ESOPHAGOGASTRODUODENOSCOPY (EGD) WITH PROPOFOL;  Surgeon: [name], MD;  Location: [facility] ENDOSCOPY;  Service: Gastroenterology;  Laterality: N/A;  *Note to anesthesia: Per pt's pulmonologist, if intubating, please extubate to BIPAP.   EYE SURGERY     FOOT SURGERY     INTRAMEDULLARY (IM) NAIL INTERTROCHANTERIC Right 09/30/2018   Procedure: INTRAMEDULLARY (IM) NAIL INTERTROCHANTRIC;  Surgeon: Dereck Leep, MD;  Location: [facility] ORS;  Service: Orthopedics;  Laterality: Right;   KYPHOPLASTY N/A 07/05/2016   Procedure: KYPHOPLASTY T - 9;  Surgeon: Hessie Knows, MD;  Location: [facility] ORS;  Service: Orthopedics;  Laterality: N/A;   KYPHOPLASTY N/A 11/29/2017   Procedure: Iona Hansen;  Surgeon: Hessie Knows, MD;  Location: [facility] ORS;  Service: Orthopedics;  Laterality: N/A;  L2 and L3   KYPHOPLASTY N/A 12/18/2017   Procedure: KYPHOPLASTY L1;  Surgeon: Hessie Knows, MD;  Location: [facility] ORS;  Service: Orthopedics;  Laterality:  N/A;   KYPHOPLASTY N/A 01/05/2018   Procedure: KYPHOPLASTY-T11,T12;  Surgeon: Hessie Knows, MD;  Location: [facility] ORS;  Service: Orthopedics;  Laterality: N/A;   KYPHOPLASTY N/A 04/05/2018   Procedure: T10 KYPHOPLASTY;  Surgeon: Hessie Knows, MD;  Location: [facility] ORS;  Service: Orthopedics;  Laterality: N/A;   KYPHOPLASTY N/A 04/12/2018   Procedure: KYPHOPLASTY T7,8;  Surgeon: Hessie Knows, MD;  Location: [facility] ORS;  Service:  Orthopedics;  Laterality: N/A;   KYPHOPLASTY N/A 04/19/2018   Procedure: KYPHOPLASTY T5, T6;  Surgeon: Hessie Knows, MD;  Location: [facility] ORS;  Service: Orthopedics;  Laterality: N/A;   LUNG SURGERY  1990 and 1996   THOROCOTOMY WITH LOBECTOMY     LEFT LOWER THORACOTOMY / RIGHT MIDDLE LOBECTOMY     Home Meds: Prior to Admission medications   Medication Sig Start Date End Date Taking? Authorizing Provider  acetaminophen (TYLENOL) 500 MG tablet Take 1,000 mg by mouth every 6 (six) hours as needed for mild pain or headache.    Yes [provider]  amiodarone (PACERONE) 200 MG tablet Take 1 tablet (200 mg total) by mouth 2 (two) times daily. 08/07/20  Yes Val Riles, MD  azithromycin (ZITHROMAX) 250 MG tablet Take 250 mg by mouth daily. 11/06/20  Yes [provider]  bacitracin ointment Apply 1 application topically as directed. 10/13/20  Yes [provider]  benzonatate (TESSALON) 200 MG capsule Take 200 mg by mouth 3 (three) times daily as needed. 04/02/20  Yes [provider]  digoxin (LANOXIN) 0.125 MG tablet Take 62.5 mcg by mouth daily. 10/13/20  Yes [provider]  ELIQUIS 2.5 MG TABS tablet Take 2.5 mg by mouth 2 (two) times daily. 10/27/20  Yes [provider]  ferrous sulfate 325 (65 FE) MG EC tablet Take 325 mg by mouth daily.    Yes [provider]  furosemide (LASIX) 40 MG tablet Take 40 mg by mouth daily. 10/13/20  Yes [provider]  gabapentin (NEURONTIN) 300 MG capsule Take 300 mg by mouth at bedtime as needed. 07/22/20  Yes [provider]  [place] [facility]) OINT Apply 1 application topically as needed. 08/17/20  Yes [provider]  levalbuterol (XOPENEX) 0.31 MG/3ML nebulizer solution Inhale 3 mLs into the lungs every 6 (six) hours as needed. 12/10/19  Yes [provider]  LORazepam (ATIVAN) 0.5 MG tablet Take 1 tablet (0.5 mg total) by mouth at bedtime. Patient taking  differently: Take 0.5 mg by mouth at bedtime as needed. 09/10/19  Yes Enzo Bi, MD  losartan (COZAAR) 50 MG tablet Take 50 mg by mouth daily. 08/28/20  Yes [provider]  metoprolol succinate (TOPROL-XL) 25 MG 24 hr tablet Take 25 mg by mouth daily. 11/05/20  Yes [provider]  Multiple Vitamin (MULTIVITAMIN WITH MINERALS) TABS tablet Take 1 tablet by mouth daily.   Yes [provider]  ondansetron (ZOFRAN-ODT) 4 MG disintegrating tablet Take 4 mg by mouth every 8 (eight) hours as needed. Take 1 tablet (4 mg total) by mouth every eight (8) hours as needed for nausea for up to 15 days. 11/10/20 Nov 30, 2020 Yes [provider]  pantoprazole (PROTONIX) 40 MG tablet Take 40 mg by mouth 2 (two) times daily. Before meals 11/17/20  Yes [provider]  polyethylene glycol (MIRALAX / GLYCOLAX) 17 g packet Take 17 g by mouth daily as needed for severe constipation. 05/02/20  Yes Wieting, Richard, MD  predniSONE (DELTASONE) 10 MG tablet Take 5 mg by mouth daily. Then  taper to 2.5 mg starting 11-22-20 for 2 weeks. 11/08/20 11/22/20 Yes [provider]  senna (SENOKOT) 8.6 MG TABS tablet Take 2 tablets (17.2 mg total) by mouth at bedtime. 05/02/20  Yes Wieting, Richard, MD  Sodium Chloride, Inhalant, 7 % NEBU Inhale 4 mLs into the lungs 2 (two) times daily.   Yes [provider]  sucralfate (CARAFATE) 1 GM/10ML suspension Take 10 mLs by mouth every 6 (six) hours. 11/06/20  Yes [provider]  acetylcysteine (MUCOMYST) 20 % nebulizer solution Take 4 mLs by nebulization daily. Patient not taking: Reported on 11/18/2020 04/22/20   [provider]  apixaban (ELIQUIS) 5 MG TABS tablet Take 1 tablet (5 mg total) by mouth 2 (two) times daily. Patient not taking: No sig reported 09/15/20   Lorella Nimrod, MD  furosemide (LASIX) 20 MG tablet Take 1 tablet (20 mg total) by mouth daily as needed. Patient not taking: Reported on 11/18/2020 09/16/20    Alisa Graff, FNP    Inpatient Medications:   amiodarone  200 mg Oral BID   vitamin C  1,000 mg Oral Daily   [START ON 11/19/2020] aspirin EC  81 mg Oral Daily   atorvastatin  40 mg Oral Daily   Chlorhexidine Gluconate Cloth  6 each Topical Daily   digoxin  62.5 mcg Oral Daily   levofloxacin  250 mg Oral Daily   LORazepam  0.5 mg Oral QHS   losartan  100 mg Oral Daily   methylPREDNISolone (SOLU-MEDROL) injection  80 mg Intravenous Q12H   metoprolol succinate  25 mg Oral Daily   pantoprazole (PROTONIX) IV  40 mg Intravenous Q12H   sodium chloride flush  10-40 mL Intracatheter Q12H    heparin 700 Units/hr (11/18/20 1104)    Allergies:  Allergies  Allergen Reactions   Codeine Nausea And Vomiting   Sulfa Antibiotics Diarrhea and Anxiety    "Makes her feel shaky."   Penicillins Rash    Has patient had a PCN reaction causing immediate rash, facial/tongue/throat swelling, SOB or lightheadedness with hypotension: Unknown Has patient had a PCN reaction causing severe rash involving mucus membranes or skin necrosis: Unknown Has patient had a PCN reaction that required hospitalization: Unknown Has patient had a PCN reaction occurring within the last 10 years: No If all of the above answers are "NO", then may proceed with Cephalosporin use.     Social History   Socioeconomic History   Marital status: Married    Spouse name: Not on file   Number of children: Not on file   Years of education: Not on file   Highest education level: Not on file  Occupational History   Not on file  Tobacco Use   Smoking status: Never   Smokeless tobacco: Never  Vaping Use   Vaping Use: Never used  Substance and Sexual Activity   Alcohol use: No   Drug use: No   Sexual activity: Not on file  Other Topics Concern   Not on file  Social History Narrative   Not on file   Social Determinants of Health   Financial Resource Strain: Not on file  Food Insecurity: Not on file  Transportation  Needs: Not on file  Physical Activity: Not on file  Stress: Not on file  Social Connections: Not on file  Intimate Partner Violence: Not on file     Family History  Problem Relation Age of Onset   Hypertension Mother    Hypertension Father  Review of Systems Positive for chest pain, shortness of breath, cough Negative for: General:  chills, fever, night sweats or weight changes.  Cardiovascular: PND orthopnea syncope dizziness  Dermatological skin lesions rashes Respiratory: congestion Urologic: Frequent urination urination at night and hematuria Abdominal: negative for nausea, vomiting, diarrhea, bright red blood per rectum, melena, or hematemesis Neurologic: negative for visual changes, and/or hearing changes  All other systems reviewed and are otherwise negative except as noted above.  Labs: No results for input(s): CKTOTAL, CKMB, TROPONINI in the last 72 hours. Lab Results  Component Value Date   WBC 20.6 (H) 11/17/2020   HGB 11.6 (L) 11/17/2020   HCT 36.2 11/17/2020   MCV 102.3 (H) 11/17/2020   PLT 274 11/17/2020    Recent Labs  Lab 11/17/20 1350  NA 136  K 4.5  CL 93*  CO2 33*  BUN 37*  CREATININE 0.67  CALCIUM 9.4  GLUCOSE 100*   No results found for: CHOL, HDL, LDLCALC, TRIG Lab Results  Component Value Date   DDIMER 0.57 (H) 07/14/2020    Radiology/Studies:  DG Chest 2 View  Result Date: 11/17/2020 CLINICAL DATA:  Shortness of breath.  Chest pain. EXAM: CHEST - 2 VIEW COMPARISON:  09/13/2020 FINDINGS: Heart size remains mildly enlarged. Chronic atherosclerosis of the aorta. Chronic emphysema and pulmonary scarring with pulmonary resection in the right upper lung and presumed lobectomy on the left. Chronic elevation of the left hemidiaphragm. No sign of acute infiltrate. Multiple previous vertebral augmentations. Right arm PICC tip at the SVC RA junction. IMPRESSION: Previous pulmonary resection is a. chronic elevation of the left hemidiaphragm.  Emphysema and pulmonary scarring as seen previously. No active process identified. PICC tip at the SVC RA junction. Electronically Signed   By: Nelson Chimes M.D.   On: 11/17/2020 14:45   CT Angio Chest PE W and/or Wo Contrast  Result Date: 11/17/2020 CLINICAL DATA:  chronic bronchiectasis, on antibiotics, worsening respiratory symptoms, pleuritic chest pains. eval infiltrate, effusion, PE EXAM: CT ANGIOGRAPHY CHEST WITH CONTRAST TECHNIQUE: Multidetector CT imaging of the chest was performed using the standard protocol during bolus administration of intravenous contrast. Multiplanar CT image reconstructions and MIPs were obtained to evaluate the vascular anatomy. CONTRAST:  85m OMNIPAQUE IOHEXOL 350 MG/ML SOLN COMPARISON:  09/14/2020 FINDINGS: Cardiovascular: No filling defects in the pulmonary arteries to suggest pulmonary emboli. Cardiomegaly. Multi-vessel coronary artery calcifications. Tortuous calcified aorta. No aneurysm. Mediastinum/Nodes: No mediastinal, hilar, or axillary adenopathy. Large hiatal hernia again noted, unchanged. Lungs/Pleura: Postoperative changes bilaterally. Bronchiectasis again seen bilaterally, right greater than left. Areas of scarring in the lungs. No pleural effusion or acute areas of consolidation. Upper Abdomen: Imaging into the upper abdomen demonstrates no acute findings. Musculoskeletal: Multilevel vertebroplasty changes throughout the thoracic and upper lumbar spine, unchanged. Review of the MIP images confirms the above findings. IMPRESSION: No evidence of pulmonary embolus. Cardiomegaly, coronary artery disease. Stable chronic changes in the lungs with bronchiectasis and areas of scarring. Large hiatal hernia. Aortic Atherosclerosis (ICD10-I70.0). Electronically Signed   By: KRolm BaptiseM.D.   On: 11/17/2020 21:47    EKG: Normal sinus rhythm  Weights: Filed Weights   11/17/20 1347  Weight: 44.9 kg     Physical Exam: Blood pressure (!) 172/87, pulse 81,  temperature 98.3 F (36.8 C), temperature source Oral, resp. rate 18, height '5\' 6"'$  (1.676 m), weight 44.9 kg, SpO2 100 %. Body mass index is 15.98 kg/m. General: Well developed, well nourished, in no acute distress. Head eyes ears nose throat:  Normocephalic, atraumatic, sclera non-icteric, no xanthomas, nares are without discharge. No apparent thyromegaly and/or mass  Lungs: Normal respiratory effort.  no wheezes, no rales, no rhonchi.  Heart: RRR with normal S1 S2. no murmur gallop, no rub, PMI is normal size and placement, carotid upstroke normal without bruit, jugular venous pressure is normal Abdomen: Soft, non-tender, non-distended with normoactive bowel sounds. No hepatomegaly. No rebound/guarding. No obvious abdominal masses. Abdominal aorta is normal size without bruit Extremities: No edema. no cyanosis, no clubbing, no ulcers  Peripheral : 2+ bilateral upper extremity pulses, 2+ bilateral femoral pulses, 2+ bilateral dorsal pedal pulse Neuro: Alert and oriented. No facial asymmetry. No focal deficit. Moves all extremities spontaneously. Musculoskeletal: Normal muscle tone without kyphosis Psych:  Responds to questions appropriately with a normal affect.    Assessment: 75 year old female with a past medical history of coronary artery disease, atrial fibrillation on Eliquis, CHF, hypertension who presented with complaints of chest discomfort and shortness of breath with elevated troponin peaking at 703, currently in the 500s. Pt also with acute respiratory illness.  Echocardiogram showing normal LV systolic function with an EF of 50 to 55%, normal RV systolic function.  Plan: -Continue heparin drip for NSTEMI management.  Continue to hold Eliquis -Medical management to include statin, metoprolol, losartan. -As needed nitroglycerin for chest pain symptoms -Continue outpatient management of atrial fibrillation including amiodarone, digoxin, metoprolol with no changes. -Plan for right and  left cardiac catheterization tomorrow for evaluation of possible acute coronary syndrome  Signed, Jettie Booze PA-C [facility] 11/18/2020, 1:20 PM  The patient has been interviewed and examined. I agree with assessment and plan above. Serafina Royals MD [facility]

## 2020-11-18 NOTE — Consult Note (Signed)
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 She is also with large left sided hiatal hernia which occupies her entire left hemithorax. She is with chronic hypoxemia due to both of these etiologies. She also has severe protein calorie malnutrition and is unable to improve due to cachexia. She was seen at [facility] not too long ago and is being optimized for surgical repair of this giant hiatal hernia which is precluding normal enteric feeding and normal respiration. She is now with acute exacerbation of her bronchiectasis.    PAST MEDICAL HISTORY   Past Medical History:  Diagnosis Date   Arthritis    Atrial fibrillation ([place])    Bronchiectasis (HCC)    CAD (coronary artery disease) 07/30/2017   CHF (congestive heart failure) (HCC)    Dumping syndrome    Essential hypertension, malignant 10/03/2013   Family history of adverse reaction to anesthesia    sister [name]   GERD (gastroesophageal reflux disease)    Headache    MIGRAINES   Myocardial infarction ([place]) 2007   Non-STEMI   [name] (postoperative nausea and vomiting)    Psoriasis    PUD (peptic ulcer disease)      SURGICAL HISTORY   Past Surgical History:  Procedure Laterality Date   BACK SURGERY     CHOLECYSTECTOMY     ESOPHAGOGASTRODUODENOSCOPY N/A 09/04/2020   Procedure: ESOPHAGOGASTRODUODENOSCOPY (EGD);  Surgeon: Virgel Manifold, MD;  Location: [facility] ENDOSCOPY;  Service: Endoscopy;  Laterality: N/A;   ESOPHAGOGASTRODUODENOSCOPY (EGD) WITH PROPOFOL N/A 03/19/2019   Procedure: ESOPHAGOGASTRODUODENOSCOPY (EGD) WITH  PROPOFOL;  Surgeon: [name], MD;  Location: [facility] ENDOSCOPY;  Service: Gastroenterology;  Laterality: N/A;  *Note to anesthesia: Per pt's pulmonologist, if intubating, please extubate to BIPAP.   EYE SURGERY     FOOT SURGERY     INTRAMEDULLARY (IM) NAIL INTERTROCHANTERIC Right 09/30/2018   Procedure: INTRAMEDULLARY (IM) NAIL INTERTROCHANTRIC;  Surgeon: Dereck Leep, MD;  Location: [facility] ORS;  Service: Orthopedics;  Laterality: Right;   KYPHOPLASTY N/A 07/05/2016   Procedure: KYPHOPLASTY T - 9;  Surgeon: Hessie Knows, MD;  Location: [facility] ORS;  Service: Orthopedics;  Laterality: N/A;   KYPHOPLASTY N/A 11/29/2017   Procedure: Iona Hansen;  Surgeon: Hessie Knows, MD;  Location: [facility] ORS;  Service: Orthopedics;  Laterality: N/A;  L2 and L3   KYPHOPLASTY N/A 12/18/2017   Procedure: KYPHOPLASTY L1;  Surgeon: Hessie Knows, MD;  Location: [facility] ORS;  Service: Orthopedics;  Laterality: N/A;   KYPHOPLASTY N/A 01/05/2018   Procedure: KYPHOPLASTY-T11,T12;  Surgeon: Hessie Knows, MD;  Location: [facility] ORS;  Service: Orthopedics;  Laterality: N/A;   KYPHOPLASTY N/A 04/05/2018   Procedure: T10 KYPHOPLASTY;  Surgeon: Hessie Knows, MD;  Location: [facility] ORS;  Service: Orthopedics;  Laterality: N/A;   KYPHOPLASTY N/A 04/12/2018   Procedure: KYPHOPLASTY T7,8;  Surgeon: Hessie Knows, MD;  Location: [facility] ORS;  Service: Orthopedics;  Laterality: N/A;   KYPHOPLASTY N/A 04/19/2018   Procedure: KYPHOPLASTY T5, T6;  Surgeon:  Hessie Knows, MD;  Location: [facility] ORS;  Service: Orthopedics;  Laterality: N/A;   LUNG SURGERY  1990 and 1996   THOROCOTOMY WITH LOBECTOMY     LEFT LOWER THORACOTOMY / RIGHT MIDDLE LOBECTOMY     FAMILY HISTORY   Family History  Problem Relation Age of Onset   Hypertension Mother    Hypertension Father      SOCIAL HISTORY   Social History   Tobacco Use   Smoking status: Never   Smokeless tobacco: Never  Vaping Use   Vaping Use: Never used  Substance Use Topics   Alcohol use:  No   Drug use: No     MEDICATIONS    Home Medication:  Current Outpatient Rx   Order #: [id number] Class: Historical Med   Order #: [id number] Class: Normal   Order #: [id number] Class: Historical Med   Order #: [id number] Class: Historical Med   Order #: [id number] Class: Historical Med   Order #: [id number] Class: Historical Med   Order #: [id number] Class: Historical Med   Order #: [id number] Class: Historical Med   Order #: [id number] Class: Historical Med   Order #: [id number] Class: Historical Med   Order #: [id number] Class: Historical Med   Order #: [id number] Class: Historical Med   Order #: [id number] Class: No Print   Order #: [id number] Class: Historical Med   Order #: [id number] Class: Historical Med   Order #: [id number] Class: Historical Med   Order #: [id number] Class: Historical Med   Order #: [id number] Class: Historical Med   Order #: [id number] Class: Normal   Order #: [id number] Class: Historical Med   Order #: [id number] Class: Normal   Order #: [id number] Class: Historical Med   Order #: [id number] Class: Historical Med   Order #: [id number] Class: Historical Med   Order #: [id number] Class: Normal   Order #: [id number] Class: No Print    Current Medication:  Current Facility-Administered Medications:    acetaminophen (TYLENOL) tablet 650 mg, 650 mg, Oral, Q4H PRN, Para Skeans, MD, 650 mg at 11/18/20 1057   acetylcysteine (MUCOMYST) 20 % nebulizer / oral solution 4 mL, 4 mL, Nebulization, Daily, Fritzi Mandes, MD   amiodarone (PACERONE) tablet 200 mg, 200 mg, Oral, BID, Florina Ou V, MD, 200 mg at 11/18/20 1054   ascorbic acid (VITAMIN C) tablet 1,000 mg, 1,000 mg, Oral, Daily, Florina Ou V, MD, 1,000 mg at 11/18/20 1051   [START ON 11/19/2020] aspirin EC tablet 81 mg, 81 mg, Oral, Daily, Patel, Ekta V, MD   atorvastatin (LIPITOR) tablet 40 mg, 40 mg, Oral, Daily, Florina Ou V, MD, 40 mg at 11/18/20 1053   benzonatate (TESSALON) capsule 200 mg, 200 mg, Oral, TID PRN, Para Skeans, MD    Chlorhexidine Gluconate Cloth 2 % PADS 6 each, 6 each, Topical, Daily, Para Skeans, MD   digoxin (LANOXIN) tablet 62.5 mcg, 62.5 mcg, Oral, Daily, Fritzi Mandes, MD, 62.5 mcg at 11/18/20 1054   ferrous sulfate tablet 325 mg, 325 mg, Oral, Daily, Fritzi Mandes, MD   furosemide (LASIX) tablet 40 mg, 40 mg, Oral, Daily, Fritzi Mandes, MD   gabapentin (NEURONTIN) capsule 300 mg, 300 mg, Oral, QHS PRN, Florina Ou V, MD   heparin ADULT infusion 100 units/mL (25000 units/267m), 700 Units/hr, Intravenous, Continuous, Nazari, Walid A, RPH, Last Rate: 7 mL/hr at 11/18/20 1104, 700 Units/hr at 11/18/20 1104   ipratropium-albuterol (DUONEB) 0.5-2.5 (3) MG/3ML nebulizer solution 6 mL, 6 mL, Nebulization, Q6H PRN, PPara Skeans MD   [START ON 11/20/2020] levofloxacin (LEVAQUIN) IVPB 750 mg, 750 mg, Intravenous, Q48H, PFritzi Mandes MD   levofloxacin (  LEVAQUIN) tablet 500 mg, 500 mg, Oral, Once, Oswald Hillock, RPH   LORazepam (ATIVAN) tablet 0.5 mg, 0.5 mg, Oral, QHS, Florina Ou V, MD, 0.5 mg at 11/18/20 0109   losartan (COZAAR) tablet 100 mg, 100 mg, Oral, Daily, Florina Ou V, MD, 100 mg at 11/18/20 1053   methylPREDNISolone sodium succinate (SOLU-MEDROL) 125 mg/2 mL injection 80 mg, 80 mg, Intravenous, Q12H, Para Skeans, MD, 80 mg at 11/18/20 1222   metoprolol succinate (TOPROL-XL) 24 hr tablet 25 mg, 25 mg, Oral, Daily, Florina Ou V, MD, 25 mg at 11/18/20 1053   morphine 2 MG/ML injection 0.5 mg, 0.5 mg, Intravenous, Q6H PRN, Para Skeans, MD   multivitamin with minerals tablet 1 tablet, 1 tablet, Oral, Daily, Fritzi Mandes, MD   nitroGLYCERIN (NITROSTAT) SL tablet 0.4 mg, 0.4 mg, Sublingual, Q5 Min x 3 PRN, Para Skeans, MD   pantoprazole (PROTONIX) EC tablet 40 mg, 40 mg, Oral, BID AC, Fritzi Mandes, MD   senna (SENOKOT) tablet 17.2 mg, 2 tablet, Oral, QHS, Patel, Sona, MD   sodium chloride flush (NS) 0.9 % injection 10-40 mL, 10-40 mL, Intracatheter, Q12H, Florina Ou V, MD, 20 mL at 11/18/20 0057    sodium chloride flush (NS) 0.9 % injection 10-40 mL, 10-40 mL, Intracatheter, PRN, Para Skeans, MD   sodium chloride flush (NS) 0.9 % injection 3 mL, 3 mL, Intravenous, Q12H, Corey Skains, MD, 10 mL at 11/18/20 1543   sucralfate (CARAFATE) 1 GM/10ML suspension 1 g, 1 g, Oral, Q6H, Fritzi Mandes, MD  Current Outpatient Medications:    acetaminophen (TYLENOL) 500 MG tablet, Take 1,000 mg by mouth every 6 (six) hours as needed for mild pain or headache. , Disp: , Rfl:    amiodarone (PACERONE) 200 MG tablet, Take 1 tablet (200 mg total) by mouth 2 (two) times daily., Disp: 60 tablet, Rfl: 0   azithromycin (ZITHROMAX) 250 MG tablet, Take 250 mg by mouth daily., Disp: , Rfl:    bacitracin ointment, Apply 1 application topically as directed., Disp: , Rfl:    benzonatate (TESSALON) 200 MG capsule, Take 200 mg by mouth 3 (three) times daily as needed., Disp: , Rfl:    digoxin (LANOXIN) 0.125 MG tablet, Take 62.5 mcg by mouth daily., Disp: , Rfl:    ELIQUIS 2.5 MG TABS tablet, Take 2.5 mg by mouth 2 (two) times daily., Disp: , Rfl:    ferrous sulfate 325 (65 FE) MG EC tablet, Take 325 mg by mouth daily. , Disp: , Rfl:    furosemide (LASIX) 40 MG tablet, Take 40 mg by mouth daily., Disp: , Rfl:    gabapentin (NEURONTIN) 300 MG capsule, Take 300 mg by mouth at bedtime as needed., Disp: , Rfl:    Infant Care Products (DERMACLOUD) OINT, Apply 1 application topically as needed., Disp: , Rfl:    levalbuterol (XOPENEX) 0.31 MG/3ML nebulizer solution, Inhale 3 mLs into the lungs every 6 (six) hours as needed., Disp: , Rfl:    LORazepam (ATIVAN) 0.5 MG tablet, Take 1 tablet (0.5 mg total) by mouth at bedtime. (Patient taking differently: Take 0.5 mg by mouth at bedtime as needed.), Disp: , Rfl:    losartan (COZAAR) 50 MG tablet, Take 50 mg by mouth daily., Disp: , Rfl:    metoprolol succinate (TOPROL-XL) 25 MG 24 hr tablet, Take 25 mg by mouth daily., Disp: , Rfl:    Multiple Vitamin (MULTIVITAMIN WITH  MINERALS) TABS tablet, Take 1 tablet by mouth daily., Disp: ,  Rfl:    ondansetron (ZOFRAN-ODT) 4 MG disintegrating tablet, Take 4 mg by mouth every 8 (eight) hours as needed. Take 1 tablet (4 mg total) by mouth every eight (8) hours as needed for nausea for up to 15 days., Disp: , Rfl:    pantoprazole (PROTONIX) 40 MG tablet, Take 40 mg by mouth 2 (two) times daily. Before meals, Disp: , Rfl:    polyethylene glycol (MIRALAX / GLYCOLAX) 17 g packet, Take 17 g by mouth daily as needed for severe constipation., Disp: 30 each, Rfl: 0   predniSONE (DELTASONE) 10 MG tablet, Take 5 mg by mouth daily. Then taper to 2.5 mg starting 11-22-20 for 2 weeks., Disp: , Rfl:    senna (SENOKOT) 8.6 MG TABS tablet, Take 2 tablets (17.2 mg total) by mouth at bedtime., Disp: 60 tablet, Rfl: 0   Sodium Chloride, Inhalant, 7 % NEBU, Inhale 4 mLs into the lungs 2 (two) times daily., Disp: , Rfl:    sucralfate (CARAFATE) 1 GM/10ML suspension, Take 10 mLs by mouth every 6 (six) hours., Disp: , Rfl:    acetylcysteine (MUCOMYST) 20 % nebulizer solution, Take 4 mLs by nebulization daily. (Patient not taking: Reported on 11/18/2020), Disp: , Rfl:    apixaban (ELIQUIS) 5 MG TABS tablet, Take 1 tablet (5 mg total) by mouth 2 (two) times daily. (Patient not taking: No sig reported), Disp: 60 tablet, Rfl: 1   furosemide (LASIX) 20 MG tablet, Take 1 tablet (20 mg total) by mouth daily as needed. (Patient not taking: Reported on 11/18/2020), Disp: 30 tablet, Rfl: 0    ALLERGIES   Codeine, Sulfa antibiotics, and Penicillins     REVIEW OF SYSTEMS    Review of Systems:  Gen:  Denies  fever, sweats, chills weigh loss  HEENT: Denies blurred vision, double vision, ear pain, eye pain, hearing loss, nose bleeds, sore throat Cardiac:  No dizziness, chest pain or heaviness, chest tightness,edema Resp:   reports severe dyspnea cough which is productive of thick yellow/brown phlegm Gi: admits to NVD Gu:  Denies bladder incontinence,  burning urine Ext:   Denies Joint pain, stiffness or swelling Skin: Denies  skin rash, easy bruising or bleeding or hives Endoc:  Denies polyuria, polydipsia , polyphagia or weight change Psych:   Denies depression, insomnia or hallucinations   Other:  All other systems negative   VS: BP (!) 159/80   Pulse 71   Temp 98.3 F (36.8 C) (Oral)   Resp 18   Ht _0  (1.676 m)   Wt 44.9 kg   SpO2 99%   BMI 15.98 kg/m      PHYSICAL EXAM    GENERAL:NAD, no fevers, chills, no weakness no fatigue HEAD: Normocephalic, atraumatic.  EYES: Pupils equal, round, reactive to light. Extraocular muscles intact. No scleral icterus.  MOUTH: Moist mucosal membrane. Dentition intact. No abscess noted.  EAR, NOSE, THROAT: Clear without exudates. No external lesions.  NECK: Supple. No thyromegaly. No nodules. No JVD.  PULMONARY: Diffuse coarse rhonchi bilaterally CARDIOVASCULAR: S1 and S2. Regular rate and rhythm. No murmurs, rubs, or gallops. No edema. Pedal pulses 2+ bilaterally.  GASTROINTESTINAL: Soft, nontender, nondistended. No masses. Positive bowel sounds. No hepatosplenomegaly.  MUSCULOSKELETAL: No swelling, clubbing, or edema. Range of motion full in all extremities.  NEUROLOGIC: Cranial nerves II through XII are intact. No gross focal neurological deficits. Sensation intact. Reflexes intact.  SKIN: No ulceration, lesions, rashes, or cyanosis. Skin warm and dry. Turgor intact.  PSYCHIATRIC: Mood, affect within normal limits.  The patient is awake, alert and oriented x 3. Insight, judgment intact.       IMAGING    DG Chest 2 View  Result Date: 11/17/2020 CLINICAL DATA:  Shortness of breath.  Chest pain. EXAM: CHEST - 2 VIEW COMPARISON:  09/13/2020 FINDINGS: Heart size remains mildly enlarged. Chronic atherosclerosis of the aorta. Chronic emphysema and pulmonary scarring with pulmonary resection in the right upper lung and presumed lobectomy on the left. Chronic elevation of the left  hemidiaphragm. No sign of acute infiltrate. Multiple previous vertebral augmentations. Right arm PICC tip at the SVC RA junction. IMPRESSION: Previous pulmonary resection is a. chronic elevation of the left hemidiaphragm. Emphysema and pulmonary scarring as seen previously. No active process identified. PICC tip at the SVC RA junction. Electronically Signed   By: Nelson Chimes M.D.   On: 11/17/2020 14:45   CT Angio Chest PE W and/or Wo Contrast  Result Date: 11/17/2020 CLINICAL DATA:  chronic bronchiectasis, on antibiotics, worsening respiratory symptoms, pleuritic chest pains. eval infiltrate, effusion, PE EXAM: CT ANGIOGRAPHY CHEST WITH CONTRAST TECHNIQUE: Multidetector CT imaging of the chest was performed using the standard protocol during bolus administration of intravenous contrast. Multiplanar CT image reconstructions and MIPs were obtained to evaluate the vascular anatomy. CONTRAST:  62m OMNIPAQUE IOHEXOL 350 MG/ML SOLN COMPARISON:  09/14/2020 FINDINGS: Cardiovascular: No filling defects in the pulmonary arteries to suggest pulmonary emboli. Cardiomegaly. Multi-vessel coronary artery calcifications. Tortuous calcified aorta. No aneurysm. Mediastinum/Nodes: No mediastinal, hilar, or axillary adenopathy. Large hiatal hernia again noted, unchanged. Lungs/Pleura: Postoperative changes bilaterally. Bronchiectasis again seen bilaterally, right greater than left. Areas of scarring in the lungs. No pleural effusion or acute areas of consolidation. Upper Abdomen: Imaging into the upper abdomen demonstrates no acute findings. Musculoskeletal: Multilevel vertebroplasty changes throughout the thoracic and upper lumbar spine, unchanged. Review of the MIP images confirms the above findings. IMPRESSION: No evidence of pulmonary embolus. Cardiomegaly, coronary artery disease. Stable chronic changes in the lungs with bronchiectasis and areas of scarring. Large hiatal hernia. Aortic Atherosclerosis (ICD10-I70.0).  Electronically Signed   By: KRolm BaptiseM.D.   On: 11/17/2020 21:47   ECHOCARDIOGRAM COMPLETE  Result Date: 11/18/2020    ECHOCARDIOGRAM REPORT   Patient Name:   Ana GJACE DOWEDate of Exam: 11/18/2020 Medical Rec #:  [id number]      Height:       66.0 in Accession #:    [id number]     Weight:       99.0 lb Date of Birth:  709-13-1947      BSA:          1.483 m Patient Age:    89years        BP:           132/68 mmHg Patient Gender: F               HR:           70 bpm. Exam Location:  [facility] Procedure: 2D Echo, Cardiac Doppler and Color Doppler Indications:     CHF-acute diastolic IX73.53 History:         Patient has prior history of Echocardiogram examinations, most                  recent 04/30/2020. Previous Myocardial Infarction,                  Arrythmias:Atrial Fibrillation; Risk Factors:Hypertension.  Sonographer:     JSherrie SportReferring Phys:  [place] Diagnosing Phys: Serafina Royals MD  Sonographer Comments: Suboptimal apical window. IMPRESSIONS  1. Left ventricular ejection fraction, by estimation, is 50 to 55%. The left ventricle has low normal function. The left ventricle has no regional wall motion abnormalities. Left ventricular diastolic parameters were normal.  2. Right ventricular systolic function is normal. The right ventricular size is normal.  3. The mitral valve is normal in structure. Mild mitral valve regurgitation.  4. The aortic valve is normal in structure. Aortic valve regurgitation is mild. FINDINGS  Left Ventricle: Left ventricular ejection fraction, by estimation, is 50 to 55%. The left ventricle has low normal function. The left ventricle has no regional wall motion abnormalities. The left ventricular internal cavity size was normal in size. There is no left ventricular hypertrophy. Left ventricular diastolic parameters were normal. Right Ventricle: The right ventricular size is normal. No increase in right ventricular wall thickness. Right ventricular systolic  function is normal. Left Atrium: Left atrial size was normal in size. Right Atrium: Right atrial size was normal in size. Pericardium: There is no evidence of pericardial effusion. Mitral Valve: The mitral valve is normal in structure. Mild mitral valve regurgitation. Tricuspid Valve: The tricuspid valve is normal in structure. Tricuspid valve regurgitation is mild. Aortic Valve: The aortic valve is normal in structure. Aortic valve regurgitation is mild. Aortic valve mean gradient measures 4.0 mmHg. Aortic valve peak gradient measures 7.3 mmHg. Aortic valve area, by VTI measures 2.33 cm. Pulmonic Valve: The pulmonic valve was normal in structure. Pulmonic valve regurgitation is trivial. Aorta: The aortic root and ascending aorta are structurally normal, with no evidence of dilitation. IAS/Shunts: No atrial level shunt detected by color flow Doppler.  LEFT VENTRICLE PLAX 2D LVIDd:         3.55 cm   Diastology LVIDs:         2.60 cm   LV e' medial:    3.70 cm/s LV PW:         0.86 cm   LV E/e' medial:  16.7 LV IVS:        0.99 cm   LV e' lateral:   4.90 cm/s LVOT diam:     2.00 cm   LV E/e' lateral: 12.6 LV SV:         57 LV SV Index:   39 LVOT Area:     3.14 cm  RIGHT VENTRICLE RV Basal diam:  3.17 cm RV S prime:     14.10 cm/s TAPSE (M-mode): 3.8 cm LEFT ATRIUM             Index        RIGHT ATRIUM           Index LA diam:        2.30 cm 1.55 cm/m   RA Area:     20.50 cm LA Vol (A2C):   70.9 ml 47.80 ml/m  RA Volume:   62.40 ml  42.07 ml/m LA Vol (A4C):   54.3 ml 36.61 ml/m LA Biplane Vol: 61.8 ml 41.67 ml/m  AORTIC VALVE                    PULMONIC VALVE AV Area (Vmax):    2.11 cm     PV Vmax:        0.80 m/s AV Area (Vmean):   2.06 cm     PV Peak grad:   2.6 mmHg AV Area (VTI):     2.33 cm  RVOT Peak grad: 3 mmHg AV Vmax:           135.00 cm/s AV Vmean:          90.100 cm/s AV VTI:            0.247 m AV Peak Grad:      7.3 mmHg AV Mean Grad:      4.0 mmHg LVOT Vmax:         90.70 cm/s LVOT Vmean:         59.000 cm/s LVOT VTI:          0.183 m LVOT/AV VTI ratio: 0.74  AORTA Ao Root diam: 3.00 cm MITRAL VALVE                TRICUSPID VALVE MV Area (PHT): 6.83 cm     TR Peak grad:   12.7 mmHg MV Decel Time: 111 msec     TR Vmax:        178.00 cm/s MV E velocity: 61.70 cm/s MV A velocity: 100.00 cm/s  SHUNTS MV E/A ratio:  0.62         Systemic VTI:  0.18 m                             Systemic Diam: 2.00 cm Serafina Royals MD Electronically signed by Serafina Royals MD Signature Date/Time: 11/18/2020/1:30:01 PM    Final       ASSESSMENT/PLAN   Acute on chronic hypoxemic respiratory failure due to Acute exacerbation of COPD  -IV levofloxacin -solumedrol weaning to 40 iv daily  -monitor renal dosing due to ckd - BPH with IS at bedside -flutter valve -PT/OT -telemetry monitoring -MRSA nasal pCR -procalcintonin   Chest pain with acute coronary syndrome  - appreciate cardiology collboration - for LHC today    Severe Left lung atelectasis    - plan for hiatal hernia repair soon after medical optimization of nutritional impairment  Severe protein calorie malnutrition - patient is on TPN via PICC     Thank you for allowing me to participate in the care of this patient.  Total face to face encounter time for this patient visit was >45 min. >50% of the time was  spent in counseling and coordination of care.   Patient/Family are satisfied with care plan and all questions have been answered.  This document was prepared using Dragon voice recognition software and may include unintentional dictation errors.     Ottie Glazier, M.D.  Division of [place]

## 2020-11-18 NOTE — Progress Notes (Signed)
*  PRELIMINARY RESULTS* Echocardiogram 2D Echocardiogram has been performed.  Sherrie Sport 11/18/2020, 9:05 AM

## 2020-11-19 ENCOUNTER — Encounter: Payer: Self-pay | Admitting: Internal Medicine

## 2020-11-19 ENCOUNTER — Encounter
Admission: EM | Disposition: A | Discharge status: Home Health | Payer: Self-pay | Source: Home / Self Care | Attending: Internal Medicine

## 2020-11-19 HISTORY — PX: RIGHT/LEFT HEART CATH AND CORONARY ANGIOGRAPHY: CATH118266

## 2020-11-19 LAB — COMPREHENSIVE METABOLIC PANEL
ALT: 18 U/L (ref 0–44)
AST: 29 U/L (ref 15–41)
Albumin: 2.7 g/dL — ABNORMAL LOW (ref 3.5–5.0)
Alkaline Phosphatase: 104 U/L (ref 38–126)
Anion gap: 8 (ref 5–15)
BUN: 46 mg/dL — ABNORMAL HIGH (ref 8–23)
CO2: 37 mmol/L — ABNORMAL HIGH (ref 22–32)
Calcium: 9 mg/dL (ref 8.9–10.3)
Chloride: 94 mmol/L — ABNORMAL LOW (ref 98–111)
Creatinine, Ser: 0.98 mg/dL (ref 0.44–1.00)
GFR, Estimated: 60 mL/min — ABNORMAL LOW (ref >60)
Glucose, Bld: 117 mg/dL — ABNORMAL HIGH (ref 70–99)
Potassium: 3.9 mmol/L (ref 3.5–5.1)
Sodium: 139 mmol/L (ref 135–145)
Total Bilirubin: 0.2 mg/dL — ABNORMAL LOW (ref 0.3–1.2)
Total Protein: 5.3 g/dL — ABNORMAL LOW (ref 6.5–8.1)

## 2020-11-19 LAB — CBC
HCT: 32.8 % — ABNORMAL LOW (ref 36.0–46.0)
Hemoglobin: 10.5 g/dL — ABNORMAL LOW (ref 12.0–15.0)
MCH: 32.9 pg (ref 26.0–34.0)
MCHC: 32 g/dL (ref 30.0–36.0)
MCV: 102.8 fL — ABNORMAL HIGH (ref 80.0–100.0)
Platelets: 271 10*3/uL (ref 150–400)
RBC: 3.19 MIL/uL — ABNORMAL LOW (ref 3.87–5.11)
RDW: 12.8 % (ref 11.5–15.5)
WBC: 22.3 10*3/uL — ABNORMAL HIGH (ref 4.0–10.5)
nRBC: 0 % (ref 0.0–0.2)

## 2020-11-19 LAB — PHOSPHORUS: Phosphorus: 5 mg/dL — ABNORMAL HIGH (ref 2.5–4.6)

## 2020-11-19 LAB — GLUCOSE, CAPILLARY
Glucose-Capillary: 175 mg/dL — ABNORMAL HIGH (ref 70–99)
Glucose-Capillary: 102 mg/dL — ABNORMAL HIGH (ref 70–99)

## 2020-11-19 LAB — APTT: aPTT: 86 seconds — ABNORMAL HIGH (ref 24–36)

## 2020-11-19 LAB — HEPARIN LEVEL (UNFRACTIONATED): Heparin Unfractionated: >1.1 IU/mL — ABNORMAL HIGH (ref 0.30–0.70)

## 2020-11-19 LAB — TRIGLYCERIDES: Triglycerides: 80 mg/dL (ref <150)

## 2020-11-19 LAB — MAGNESIUM: Magnesium: 2 mg/dL (ref 1.7–2.4)

## 2020-11-19 LAB — PROCALCITONIN: Procalcitonin: <0.1 ng/mL

## 2020-11-19 SURGERY — RIGHT/LEFT HEART CATH AND CORONARY ANGIOGRAPHY
Anesthesia: Moderate Sedation

## 2020-11-19 MED ORDER — INSULIN ASPART 100 UNIT/ML IJ SOLN
0.0000 [IU] | INTRAMUSCULAR | Status: DC
  Administered 2020-11-20: 2 [IU] via SUBCUTANEOUS
  Administered 2020-11-20: 1 [IU] via SUBCUTANEOUS
  Administered 2020-11-21: 2 [IU] via SUBCUTANEOUS
  Administered 2020-11-21: 5 [IU] via SUBCUTANEOUS
  Filled 2020-11-19 (×4): qty 1

## 2020-11-19 MED ORDER — LEVOFLOXACIN 750 MG PO TABS: 750.0000 mg | ORAL_TABLET | ORAL | Status: DC

## 2020-11-19 MED ORDER — SODIUM CHLORIDE 0.9 % WEIGHT BASED INFUSION
1.0000 mL/kg/h | INTRAVENOUS | Status: AC
  Administered 2020-11-19: 1 mL/kg/h via INTRAVENOUS

## 2020-11-19 MED ORDER — LEVOFLOXACIN IN D5W 750 MG/150ML IV SOLN
750.0000 mg | INTRAVENOUS | Status: DC
  Administered 2020-11-19: 750 mg via INTRAVENOUS
  Filled 2020-11-19: qty 150

## 2020-11-19 MED ORDER — ASPIRIN 81 MG PO CHEW: 81.0000 mg | CHEWABLE_TABLET | ORAL | Status: DC

## 2020-11-19 MED ORDER — MIDAZOLAM HCL 2 MG/2ML IJ SOLN
INTRAMUSCULAR | Status: DC | PRN
  Administered 2020-11-19: .5 mg via INTRAVENOUS

## 2020-11-19 MED ORDER — ONDANSETRON HCL 4 MG/2ML IJ SOLN: 4.0000 mg | Freq: Four times a day (QID) | INTRAMUSCULAR | Status: DC | PRN

## 2020-11-19 MED ORDER — SODIUM CHLORIDE 0.9% FLUSH: 3.0000 mL | INTRAVENOUS | Status: DC | PRN

## 2020-11-19 MED ORDER — ACETAMINOPHEN 325 MG PO TABS
650.0000 mg | ORAL_TABLET | ORAL | Status: DC | PRN
  Administered 2020-11-19 – 2020-11-21 (×4): 650 mg via ORAL
  Filled 2020-11-19 (×4): qty 2

## 2020-11-19 MED ORDER — SODIUM CHLORIDE 0.9 % WEIGHT BASED INFUSION: 3.0000 mL/kg/h | INTRAVENOUS | Status: DC

## 2020-11-19 MED ORDER — LIDOCAINE HCL (PF) 1 % IJ SOLN
INTRAMUSCULAR | Status: DC | PRN
  Administered 2020-11-19: 30 mL

## 2020-11-19 MED ORDER — SODIUM CHLORIDE 0.9 % IV SOLN: 250.0000 mL | INTRAVENOUS | Status: DC | PRN

## 2020-11-19 MED ORDER — LIDOCAINE HCL 1 % IJ SOLN
INTRAMUSCULAR | Status: AC
  Filled 2020-11-19: qty 20

## 2020-11-19 MED ORDER — LEVOFLOXACIN 500 MG PO TABS: 500.0000 mg | ORAL_TABLET | Freq: Once | ORAL | Status: DC

## 2020-11-19 MED ORDER — HEPARIN (PORCINE) IN NACL 1000-0.9 UT/500ML-% IV SOLN
INTRAVENOUS | Status: DC | PRN
  Administered 2020-11-19: 1000 mL

## 2020-11-19 MED ORDER — LABETALOL HCL 5 MG/ML IV SOLN: 10.0000 mg | INTRAVENOUS | Status: AC | PRN

## 2020-11-19 MED ORDER — APIXABAN 5 MG PO TABS
5.0000 mg | ORAL_TABLET | Freq: Two times a day (BID) | ORAL | Status: DC
  Administered 2020-11-19 – 2020-11-21 (×4): 5 mg via ORAL
  Filled 2020-11-19 (×4): qty 1

## 2020-11-19 MED ORDER — SODIUM CHLORIDE 0.9 % WEIGHT BASED INFUSION: 1.0000 mL/kg/h | INTRAVENOUS | Status: DC

## 2020-11-19 MED ORDER — HYDRALAZINE HCL 20 MG/ML IJ SOLN: 10.0000 mg | INTRAMUSCULAR | Status: AC | PRN

## 2020-11-19 MED ORDER — APIXABAN 5 MG PO TABS: 5.0000 mg | ORAL_TABLET | Freq: Two times a day (BID) | ORAL | Status: DC

## 2020-11-19 MED ORDER — MIDAZOLAM HCL 2 MG/2ML IJ SOLN
INTRAMUSCULAR | Status: AC
  Filled 2020-11-19: qty 2

## 2020-11-19 MED ORDER — FENTANYL CITRATE (PF) 100 MCG/2ML IJ SOLN
INTRAMUSCULAR | Status: AC
  Filled 2020-11-19: qty 2

## 2020-11-19 MED ORDER — IOHEXOL 350 MG/ML SOLN
INTRAVENOUS | Status: DC | PRN
  Administered 2020-11-19: 66 mL

## 2020-11-19 MED ORDER — HEPARIN (PORCINE) IN NACL 1000-0.9 UT/500ML-% IV SOLN
INTRAVENOUS | Status: AC
  Filled 2020-11-19: qty 1000

## 2020-11-19 MED ORDER — TRACE MINERALS CU-MN-SE-ZN 300-55-60-3000 MCG/ML IV SOLN
INTRAVENOUS | Status: AC
  Filled 2020-11-19: qty 528

## 2020-11-19 SURGICAL SUPPLY — 16 items
CATH INFINITI 5FR ANG PIGTAIL (CATHETERS) ×1 IMPLANT
CATH INFINITI 5FR JL4 (CATHETERS) ×1 IMPLANT
CATH INFINITI JR4 5F (CATHETERS) ×1 IMPLANT
CATH SWAN GANZ 7F STRAIGHT (CATHETERS) ×1 IMPLANT
DEVICE CLOSURE MYNXGRIP 5F (Vascular Products) ×1 IMPLANT
DRAPE BRACHIAL (DRAPES) ×1 IMPLANT
MARKER SKIN DUAL TIP RULER LAB (MISCELLANEOUS) ×1 IMPLANT
NDL PERC 18GX7CM (NEEDLE) IMPLANT
NEEDLE PERC 18GX7CM (NEEDLE) ×2 IMPLANT
PACK CARDIAC CATH (CUSTOM PROCEDURE TRAY) ×2 IMPLANT
PROTECTION STATION PRESSURIZED (MISCELLANEOUS) ×2
SET ATX SIMPLICITY (MISCELLANEOUS) ×1 IMPLANT
SHEATH AVANTI 5FR X 11CM (SHEATH) ×1 IMPLANT
SHEATH AVANTI 7FRX11 (SHEATH) ×1 IMPLANT
STATION PROTECTION PRESSURIZED (MISCELLANEOUS) IMPLANT
WIRE GUIDERIGHT .035X150 (WIRE) ×1 IMPLANT

## 2020-11-19 NOTE — Progress Notes (Signed)
[facility] Encounter Note  Patient: Ana Washington / Admit Date: 11/17/2020 / Date of Encounter: 11/19/2020, 12:52 PM   Subjective: Patient is overall done well since her admission with no evidence of significant chest discomfort.  Patient has had some improvements in shortness of breath.  Troponin peak at 703 more consistent with demand ischemia rather than acute coronary syndrome  Echocardiogram showing normal LV systolic function with ejection fraction of 50% with no evidence of significant valvular heart disease  Cardiac catheterization showing minimal atherosclerosis of mid LAD and no evidence of critical stenosis.  Right heart cath suggest mild pulmonary hypertension with mean pulmonary artery pressures of 29 mm  Review of Systems: Positive for: Shortness of breath Negative for: Vision change, hearing change, syncope, dizziness, nausea, vomiting,diarrhea, bloody stool, stomach pain, cough, congestion, diaphoresis, urinary frequency, urinary pain,skin lesions, skin rashes Others previously listed  Objective: Telemetry: Normal sinus rhythm Physical Exam: Blood pressure (!) 141/74, pulse 73, temperature 98.2 F (36.8 C), temperature source Oral, resp. rate 14, height '5\' 6"'$  (1.676 m), weight 44.9 kg, SpO2 100 %. Body mass index is 15.98 kg/m. General: Well developed, well nourished, in no acute distress. Head: Normocephalic, atraumatic, sclera non-icteric, no xanthomas, nares are without discharge. Neck: No apparent masses Lungs: Normal respirations with some wheezes, no rhonchi, no rales , no crackles   Heart: Regular rate and rhythm, normal S1 S2, no murmur, no rub, no gallop, PMI is normal size and placement, carotid upstroke normal without bruit, jugular venous pressure normal Abdomen: Soft, non-tender, non-distended with normoactive bowel sounds. No hepatosplenomegaly. Abdominal aorta is normal size without bruit Extremities: No edema, no clubbing, no  cyanosis, no ulcers,  Peripheral: 2+ radial, 2+ femoral, 2+ dorsal pedal pulses Neuro: Alert and oriented. Moves all extremities spontaneously. Psych:  Responds to questions appropriately with a normal affect.   Intake/Output Summary (Last 24 hours) at 11/19/2020 1252 Last data filed at 11/18/2020 2047 Gross per 24 hour  Intake --  Output 700 ml  Net -700 ml    Inpatient Medications:  . [MAR Hold] acetylcysteine  4 mL Nebulization Daily  . [MAR Hold] amiodarone  200 mg Oral BID  . [MAR Hold] vitamin C  1,000 mg Oral Daily  . [START ON 11/20/2020] aspirin  81 mg Oral Pre-Cath  . [MAR Hold] aspirin EC  81 mg Oral Daily  . [MAR Hold] atorvastatin  40 mg Oral Daily  . [MAR Hold] Chlorhexidine Gluconate Cloth  6 each Topical Daily  . [MAR Hold] digoxin  62.5 mcg Oral Daily  . [MAR Hold] ferrous sulfate  325 mg Oral Daily  . [MAR Hold] furosemide  40 mg Oral Daily  . [MAR Hold] insulin aspart  0-9 Units Subcutaneous Q4H  . [MAR Hold] LORazepam  0.5 mg Oral QHS  . [MAR Hold] losartan  100 mg Oral Daily  . [MAR Hold] methylPREDNISolone (SOLU-MEDROL) injection  80 mg Intravenous Q24H  . [MAR Hold] metoprolol succinate  25 mg Oral Daily  . [MAR Hold] multivitamin with minerals  1 tablet Oral Daily  . [MAR Hold] pantoprazole  40 mg Oral BID AC  . [MAR Hold] senna  2 tablet Oral QHS  . [MAR Hold] sodium chloride flush  10-40 mL Intracatheter Q12H  . [MAR Hold] sodium chloride flush  3 mL Intravenous Q12H  . [MAR Hold] sucralfate  1 g Oral Q6H   Infusions:  . sodium chloride    . [START ON 11/20/2020] sodium chloride  Followed by  . [START ON 11/20/2020] sodium chloride    . heparin Stopped (11/19/20 1120)  . [MAR Hold] levofloxacin (LEVAQUIN) IV    . TPN ADULT (ION)      Labs: Recent Labs    11/17/20 1350 11/19/20 0611  NA 136 139  K 4.5 3.9  CL 93* 94*  CO2 33* 37*  GLUCOSE 100* 117*  BUN 37* 46*  CREATININE 0.67 0.98  CALCIUM 9.4 9.0  MG  --  2.0  PHOS  --  5.0*    Recent Labs    11/19/20 0611  AST 29  ALT 18  ALKPHOS 104  BILITOT 0.2*  PROT 5.3*  ALBUMIN 2.7*   Recent Labs    11/17/20 1350 11/19/20 0611  WBC 20.6* 22.3*  HGB 11.6* 10.5*  HCT 36.2 32.8*  MCV 102.3* 102.8*  PLT 274 271   No results for input(s): CKTOTAL, CKMB, TROPONINI in the last 72 hours. Invalid input(s): POCBNP No results for input(s): HGBA1C in the last 72 hours.   Weights: Filed Weights   11/17/20 1347  Weight: 44.9 kg     Radiology/Studies:  DG Chest 2 View  Result Date: 11/17/2020 CLINICAL DATA:  Shortness of breath.  Chest pain. EXAM: CHEST - 2 VIEW COMPARISON:  09/13/2020 FINDINGS: Heart size remains mildly enlarged. Chronic atherosclerosis of the aorta. Chronic emphysema and pulmonary scarring with pulmonary resection in the right upper lung and presumed lobectomy on the left. Chronic elevation of the left hemidiaphragm. No sign of acute infiltrate. Multiple previous vertebral augmentations. Right arm PICC tip at the SVC RA junction. IMPRESSION: Previous pulmonary resection is a. chronic elevation of the left hemidiaphragm. Emphysema and pulmonary scarring as seen previously. No active process identified. PICC tip at the SVC RA junction. Electronically Signed   By: Nelson Chimes M.D.   On: 11/17/2020 14:45   CT Angio Chest PE W and/or Wo Contrast  Result Date: 11/17/2020 CLINICAL DATA:  chronic bronchiectasis, on antibiotics, worsening respiratory symptoms, pleuritic chest pains. eval infiltrate, effusion, PE EXAM: CT ANGIOGRAPHY CHEST WITH CONTRAST TECHNIQUE: Multidetector CT imaging of the chest was performed using the standard protocol during bolus administration of intravenous contrast. Multiplanar CT image reconstructions and MIPs were obtained to evaluate the vascular anatomy. CONTRAST:  3m OMNIPAQUE IOHEXOL 350 MG/ML SOLN COMPARISON:  09/14/2020 FINDINGS: Cardiovascular: No filling defects in the pulmonary arteries to suggest pulmonary emboli.  Cardiomegaly. Multi-vessel coronary artery calcifications. Tortuous calcified aorta. No aneurysm. Mediastinum/Nodes: No mediastinal, hilar, or axillary adenopathy. Large hiatal hernia again noted, unchanged. Lungs/Pleura: Postoperative changes bilaterally. Bronchiectasis again seen bilaterally, right greater than left. Areas of scarring in the lungs. No pleural effusion or acute areas of consolidation. Upper Abdomen: Imaging into the upper abdomen demonstrates no acute findings. Musculoskeletal: Multilevel vertebroplasty changes throughout the thoracic and upper lumbar spine, unchanged. Review of the MIP images confirms the above findings. IMPRESSION: No evidence of pulmonary embolus. Cardiomegaly, coronary artery disease. Stable chronic changes in the lungs with bronchiectasis and areas of scarring. Large hiatal hernia. Aortic Atherosclerosis (ICD10-I70.0). Electronically Signed   By: KRolm BaptiseM.D.   On: 11/17/2020 21:47   ECHOCARDIOGRAM COMPLETE  Result Date: 11/18/2020    ECHOCARDIOGRAM REPORT   Patient Name:   Deniesha GGLORIANA LOTZDate of Exam: 11/18/2020 Medical Rec #:  [id number]      Height:       66.0 in Accession #:    [id number]     Weight:       99.0  lb Date of Birth:  December 12, 1945       BSA:          1.483 m Patient Age:    25 years        BP:           132/68 mmHg Patient Gender: F               HR:           70 bpm. Exam Location:  [facility] Procedure: 2D Echo, Cardiac Doppler and Color Doppler Indications:     CHF-acute diastolic [id number]  History:         Patient has prior history of Echocardiogram examinations, most                  recent 04/30/2020. Previous Myocardial Infarction,                  Arrythmias:Atrial Fibrillation; Risk Factors:Hypertension.  Sonographer:     Sherrie Sport Referring Phys:  [place] Diagnosing Phys: Serafina Royals MD  Sonographer Comments: Suboptimal apical window. IMPRESSIONS  1. Left ventricular ejection fraction, by estimation, is 50 to 55%. The left ventricle  has low normal function. The left ventricle has no regional wall motion abnormalities. Left ventricular diastolic parameters were normal.  2. Right ventricular systolic function is normal. The right ventricular size is normal.  3. The mitral valve is normal in structure. Mild mitral valve regurgitation.  4. The aortic valve is normal in structure. Aortic valve regurgitation is mild. FINDINGS  Left Ventricle: Left ventricular ejection fraction, by estimation, is 50 to 55%. The left ventricle has low normal function. The left ventricle has no regional wall motion abnormalities. The left ventricular internal cavity size was normal in size. There is no left ventricular hypertrophy. Left ventricular diastolic parameters were normal. Right Ventricle: The right ventricular size is normal. No increase in right ventricular wall thickness. Right ventricular systolic function is normal. Left Atrium: Left atrial size was normal in size. Right Atrium: Right atrial size was normal in size. Pericardium: There is no evidence of pericardial effusion. Mitral Valve: The mitral valve is normal in structure. Mild mitral valve regurgitation. Tricuspid Valve: The tricuspid valve is normal in structure. Tricuspid valve regurgitation is mild. Aortic Valve: The aortic valve is normal in structure. Aortic valve regurgitation is mild. Aortic valve mean gradient measures 4.0 mmHg. Aortic valve peak gradient measures 7.3 mmHg. Aortic valve area, by VTI measures 2.33 cm. Pulmonic Valve: The pulmonic valve was normal in structure. Pulmonic valve regurgitation is trivial. Aorta: The aortic root and ascending aorta are structurally normal, with no evidence of dilitation. IAS/Shunts: No atrial level shunt detected by color flow Doppler.  LEFT VENTRICLE PLAX 2D LVIDd:         3.55 cm   Diastology LVIDs:         2.60 cm   LV e' medial:    3.70 cm/s LV PW:         0.86 cm   LV E/e' medial:  16.7 LV IVS:        0.99 cm   LV e' lateral:   4.90 cm/s LVOT  diam:     2.00 cm   LV E/e' lateral: 12.6 LV SV:         57 LV SV Index:   39 LVOT Area:     3.14 cm  RIGHT VENTRICLE RV Basal diam:  3.17 cm RV S prime:  14.10 cm/s TAPSE (M-mode): 3.8 cm LEFT ATRIUM             Index        RIGHT ATRIUM           Index LA diam:        2.30 cm 1.55 cm/m   RA Area:     20.50 cm LA Vol (A2C):   70.9 ml 47.80 ml/m  RA Volume:   62.40 ml  42.07 ml/m LA Vol (A4C):   54.3 ml 36.61 ml/m LA Biplane Vol: 61.8 ml 41.67 ml/m  AORTIC VALVE                    PULMONIC VALVE AV Area (Vmax):    2.11 cm     PV Vmax:        0.80 m/s AV Area (Vmean):   2.06 cm     PV Peak grad:   2.6 mmHg AV Area (VTI):     2.33 cm     RVOT Peak grad: 3 mmHg AV Vmax:           135.00 cm/s AV Vmean:          90.100 cm/s AV VTI:            0.247 m AV Peak Grad:      7.3 mmHg AV Mean Grad:      4.0 mmHg LVOT Vmax:         90.70 cm/s LVOT Vmean:        59.000 cm/s LVOT VTI:          0.183 m LVOT/AV VTI ratio: 0.74  AORTA Ao Root diam: 3.00 cm MITRAL VALVE                TRICUSPID VALVE MV Area (PHT): 6.83 cm     TR Peak grad:   12.7 mmHg MV Decel Time: 111 msec     TR Vmax:        178.00 cm/s MV E velocity: 61.70 cm/s MV A velocity: 100.00 cm/s  SHUNTS MV E/A ratio:  0.62         Systemic VTI:  0.18 m                             Systemic Diam: 2.00 cm Serafina Royals MD Electronically signed by Serafina Royals MD Signature Date/Time: 11/18/2020/1:30:01 PM    Final      Assessment and Recommendation  75 y.o. female with known coronary artery atherosclerosis and paroxysmal nonvalvular atrial fibrillation with significant lung disease and acute on chronic exacerbation of lung disease with hypoxia and elevated troponin consistent with demand ischemia without evidence of congestive heart failure 1.  No further cardiac intervention at this time due to no evidence of significant coronary artery disease with normal LV systolic function by echocardiogram and cardiac catheterization 2.  Reinstatement of Eliquis  for risk reduction and stroke with atrial fibrillation 3.  No change in outpatient medication management for atrial fibrillation including amiodarone for maintenance of normal sinus rhythm 4.  Metoprolol for heart rate and blood pressure control without change 5.  High intensity cholesterol therapy as before for coronary artery atherosclerosis 6.  Begin ambulation and follow-up for improvements of symptoms and okay for discharge home from cardiac standpoint with follow-up next week 7.  Please call if further questions  Signed, Serafina Royals M.D. FACC

## 2020-11-19 NOTE — Progress Notes (Signed)
Initial Nutrition Assessment  DOCUMENTATION CODES:   Severe malnutrition in context of chronic illness  INTERVENTION:   TPN per pharmacy   NUTRITION DIAGNOSIS:   Severe Malnutrition related to chronic illness (large hiatal hernia) as evidenced by severe muscle depletion, moderate fat depletion.  GOAL:   Patient will meet greater than or equal to 90% of their needs  MONITOR:   Labs, Weight trends, Skin, I & O's, PO intake  REASON FOR ASSESSMENT:   Consult New TPN/TNA  ASSESSMENT:   75 y.o. female with past medical history of arthritis, A. 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  Pt s/p cardiac cath today; pt noted to have minimal atherosclerosis of mid LAD and no evidence of critical stenosis. Right heart cath suggest mild pulmonary hypertension with mean pulmonary artery pressures of 29 mm  Met with pt in room today. Pt familiar to this RD from previous admissions. Pt with h/o large hiatal causing tortuous esophagus and dysphagia. Pt with poor oral intake at baseline. Pt scheduled for surgical repair with fundoplasty at [facility] on 11/1. Pt with h/o nasojejunal tube placed at [facility] but pt was unable to tolerate the tube and it was removed. Pt placed on TPN via PICC line on 8/22 to maximize her nutrition prior to her surgery. Pt has remained on home TPN; pt reports that she tolerates well. Pt is cycled at home; pt receives TPN for 18 hrs/day. Per chart, pt lost from 111-112lbs down to 99-102lbs from June to August 2002. Per chart, pt has remained ~99lbs since starting TPN in August in her admit weight is correct. Pt reports that she has been able to eat some liquids and soft foods at home but mainly relies on the TPN for  nutrition. Pt s/p cardiac cath today. Pt eating jello while laying flat at time of RD visit. Plan is to resume TPN at 1800 tonight. RD will increase calorie and protein goals to encourage weight gain.   Of note, pt receives TPN supplies from [facility]   Medications reviewed and include: vitamin C, aspirin, ferrous sulfate, lasix, insulin, solu-medrol, MVI, protonix, senokot, carafate, heparin  Labs reviewed: K 3.9 wnl, BUN 46(H), P 5.0(H), Mg 2.0 wnl Triglycerides 80- 10/20 Wbc- 22.3(H), Hgb 10.5(L), Hct 32.8(L)  NUTRITION - FOCUSED PHYSICAL EXAM:  Flowsheet Row Most Recent Value  Orbital Region Moderate depletion  Upper Arm Region Severe depletion  Thoracic and Lumbar Region Severe depletion  Buccal Region Moderate depletion  Temple Region Moderate depletion  Clavicle Bone Region Severe depletion  Clavicle and Acromion Bone Region Severe depletion  Scapular Bone Region Severe depletion  Dorsal Hand Severe depletion  Patellar Region Severe depletion  Anterior Thigh Region Severe depletion  Posterior Calf Region Severe depletion  Edema (RD Assessment) None  Hair Reviewed  Eyes Reviewed  Mouth Reviewed  Skin Reviewed  Nails Reviewed   Diet Order:    Diet Order             Diet clear liquid Room service appropriate? Yes; Fluid consistency: Thin  Diet effective now                  EDUCATION NEEDS:   Education needs have been addressed  Skin:  Skin Assessment: Reviewed RN Assessment (ecchymosis)  Last BM:  PTA  Height:   Ht Readings from Last 1 Encounters:  11/17/20 _0  (1.676 m)    Weight:   Wt Readings from Last 1 Encounters:  11/17/20 44.9 kg    Ideal Body Weight:  59 kg  BMI:  Body mass index is 15.98 kg/m.  Estimated Nutritional Needs:   Kcal:  1500-1700kcal/day  Protein:  75-85g/day  Fluid:  1.2-1.4L/day  Koleen Distance MS, RD, LDN Please refer to [facility] for RD and/or RD on-call/weekend/after hours pager

## 2020-11-19 NOTE — Progress Notes (Signed)
ANTICOAGULATION CONSULT NOTE   Pharmacy Consult for heparin infusion  Indication: ACS/STEMI  Patient Measurements: Height: '5\' 6"'$  (167.6 cm) Weight: 44.9 kg (99 lb) IBW/kg (Calculated) : 59.3 Heparin Dosing Weight: 44.9 kg  Vital Signs: Temp: 98 F (36.7 C) (10/19 2005) Temp Source: Oral (10/19 2005) BP: 107/61 (10/20 0600) Pulse Rate: 61 (10/20 0600)  Labs: Recent Labs    11/17/20 1350 11/17/20 1507 11/17/20 2355 11/18/20 0149 11/18/20 0643 11/18/20 0841 11/18/20 1749 11/19/20 0611  HGB 11.6*  --   --   --   --   --   --  10.5*  HCT 36.2  --   --   --   --   --   --  32.8*  PLT 274  --   --   --   --   --   --  271  APTT  --    < > 35  --   --  49* 62* 86*  LABPROT 14.4  --   --   --   --   --   --   --   INR 1.1  --   --   --   --   --   --   --   HEPARINUNFRC  --   --  >1.10*  --   --   --   --  >1.10*  CREATININE 0.67  --   --   --   --   --   --   --   TROPONINIHS 135*   < >  --  518* 562* 536*  --   --    < > = values in this interval not displayed.     Estimated Creatinine Clearance: 42.4 mL/min (by C-G formula based on SCr of 0.67 mg/dL).   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 Plan for right and left cardiac catheterization tomorrow for evaluation of possible acute coronary syndrome per  cardiology.  Goal of Therapy:  Heparin level 0.3-0.7 units/ml aPTT 66-102 seconds Monitor platelets by anticoagulation protocol: Yes   Plan:  10/20'@0611'$ : aPTT 86 sec, therapeutic x1:   Continue heparin infusion rate at 800 unit/hr DOAC PTA:  Will follow aPTT until correlation w/ HL(still not correlating this AM) Will check confirmatory aPTT in 8 hours Daily HL and CBC while on heparin.  Pearla Dubonnet, PharmD Clinical Pharmacist 11/19/2020 7:25 AM

## 2020-11-19 NOTE — Consult Note (Addendum)
PHARMACY - TOTAL PARENTERAL NUTRITION CONSULT NOTE   Indication:  Improve nourishment prior to major surgery  Patient Measurements: Height: '5\' 6"'$  (167.6 cm) Weight: 44.9 kg (99 lb) IBW/kg (Calculated) : 59.3 TPN AdjBW (KG): 44.9 Body mass index is 15.98 kg/m. Usual Weight: 44.9kg  Assessment:  75yo F with PMH of chronic bronchiectasis on 2 L home oxygen, atrial fibrillation on eliquis, CAD, CHF, large hiatal hernia who presented to hospital for chest pain and SOB. Pt is scheduled for surgery on Nov 1st at [facility] for large hiatal hernia, pt is receiving TPN to improve nourishment prior to surgery. Pharmacy was consulted for TPN management.    Glucose / Insulin: SSI ordered to start following TPN start Electrolytes: Phos noted to be elevated to 5.0, reduced from 15>95mol/L in TPN  Renal: Scr<1.0 Hepatic: LFT within normal limits  Intake / Output; MIVF: -7019msince admin, no MIVF  Central access: right PICC TPN start date: Pt was on TPA prior to admission, but will start 10/20 1800  Nutritional Goals: Goal TPN rate is 55 mL/hr (provides 79.5 g of protein (Clinisol 15%) and 1610.4 kcals per day)  RD Assessment: Estimated Needs Total Energy Estimated Needs: 1500-1700kcal/day Total Protein Estimated Needs: 75-85g/day Total Fluid Estimated Needs: 1.2-1.4L/day  Current Nutrition:  NPO  Plan:  Start TPN at 55107mr at 1800 Electrolytes in TPN: Na 102m14m, K 102mE37m Ca 5mEq/6mMg 5mEq/L78mnd decrease Phos from 15mmol/15m 5mmol/L.57m:Ac 1:1 Add standard MVI and trace elements to TPN Initiate Sensitive q4h SSI and adjust as needed  Monitor CMP, Mg, Phos daily for 3 days then CMP, Mg, Phos on Mon/Thurs, with triglycerides weekly   Nike Southwell GNarda RutherfordPharmacy Resident  11/19/2020 12:24 PM

## 2020-11-19 NOTE — Progress Notes (Signed)
[facility] [place] NAME: Ana Washington    MR#:  [id number]  DATE OF BIRTH:  [birth year]  SUBJECTIVE:  patient came in secondary to increasing cough with productive phlegm. She was seen outpatient in the pulmonary office and started on oral antibiotic and prednisone. Patient has chronic bronchiectasis continues to cough or green/yellowish phlegm.  currently is also getting IV TPN which was started at [facility] about 7 to 8 weeks ago. She has a PICC line and right upper extremity  Sister at bedside. Patient seen in specials recovery. Tells me she is upset about her antibiotic dosing. I did explain at length to her.  REVIEW OF SYSTEMS:   Review of Systems  Constitutional:  Negative for chills, fever and weight loss.  HENT:  Negative for ear discharge, ear pain and nosebleeds.   Eyes:  Negative for blurred vision, pain and discharge.  Respiratory:  Positive for cough, sputum production and shortness of breath. Negative for wheezing and stridor.   Cardiovascular:  Negative for chest pain, palpitations, orthopnea and PND.  Gastrointestinal:  Negative for abdominal pain, diarrhea, nausea and vomiting.  Genitourinary:  Negative for frequency and urgency.  Musculoskeletal:  Negative for back pain and joint pain.  Neurological:  Positive for weakness. Negative for sensory change, speech change and focal weakness.  Psychiatric/Behavioral:  Negative for depression and hallucinations. The patient is not nervous/anxious.   Tolerating Diet:yes Tolerating PT: ambulatory  DRUG ALLERGIES:   Allergies  Allergen Reactions   Codeine Nausea And Vomiting   Sulfa Antibiotics Diarrhea and Anxiety    "Makes her feel shaky."   Penicillins Rash    Has patient had a PCN reaction causing immediate rash, facial/tongue/throat swelling, SOB or lightheadedness with hypotension: Unknown Has patient had a PCN reaction causing severe rash involving mucus membranes or skin  necrosis: Unknown Has patient had a PCN reaction that required hospitalization: Unknown Has patient had a PCN reaction occurring within the last 10 years: No If all of the above answers are "NO", then may proceed with Cephalosporin use.     VITALS:  Blood pressure (!) 151/83, pulse 77, temperature 98.2 F (36.8 C), temperature source Oral, resp. rate (!) 22, height _0  (1.676 m), weight 44.9 kg, SpO2 100 %.  PHYSICAL EXAMINATION:   Physical Exam  GENERAL:  75 y.o.-year-old patient lying in the bed with no acute distress.   LUNGS: Normal breath sounds bilaterally, no wheezing, rales, rhonchi. No use of accessory muscles of respiration.  CARDIOVASCULAR: S1, S2 normal. No murmurs, rubs, or gallops.  ABDOMEN: Soft, nontender, nondistended. Bowel sounds present. No organomegaly or mass.  EXTREMITIES: No cyanosis, clubbing or edema b/l.   Right UE PICC + NEUROLOGIC: Cranial nerves II through XII are intact. No focal Motor or sensory deficits b/l.   PSYCHIATRIC:  patient is alert and oriented x 3.  SKIN: No obvious rash, lesion, or ulcer.   LABORATORY PANEL:  CBC Recent Labs  Lab 11/19/20 0611  WBC 22.3*  HGB 10.5*  HCT 32.8*  PLT 271     Chemistries  Recent Labs  Lab 11/19/20 0611  NA 139  K 3.9  CL 94*  CO2 37*  GLUCOSE 117*  BUN 46*  CREATININE 0.98  CALCIUM 9.0  MG 2.0  AST 29  ALT 18  ALKPHOS 104  BILITOT 0.2*    Cardiac Enzymes No results for input(s): TROPONINI in the last 168 hours. RADIOLOGY:  CT Angio Chest PE W and/or  Wo Contrast  Result Date: 11/17/2020 CLINICAL DATA:  chronic bronchiectasis, on antibiotics, worsening respiratory symptoms, pleuritic chest pains. eval infiltrate, effusion, PE EXAM: CT ANGIOGRAPHY CHEST WITH CONTRAST TECHNIQUE: Multidetector CT imaging of the chest was performed using the standard protocol during bolus administration of intravenous contrast. Multiplanar CT image reconstructions and MIPs were obtained to evaluate the  vascular anatomy. CONTRAST:  71m OMNIPAQUE IOHEXOL 350 MG/ML SOLN COMPARISON:  09/14/2020 FINDINGS: Cardiovascular: No filling defects in the pulmonary arteries to suggest pulmonary emboli. Cardiomegaly. Multi-vessel coronary artery calcifications. Tortuous calcified aorta. No aneurysm. Mediastinum/Nodes: No mediastinal, hilar, or axillary adenopathy. Large hiatal hernia again noted, unchanged. Lungs/Pleura: Postoperative changes bilaterally. Bronchiectasis again seen bilaterally, right greater than left. Areas of scarring in the lungs. No pleural effusion or acute areas of consolidation. Upper Abdomen: Imaging into the upper abdomen demonstrates no acute findings. Musculoskeletal: Multilevel vertebroplasty changes throughout the thoracic and upper lumbar spine, unchanged. Review of the MIP images confirms the above findings. IMPRESSION: No evidence of pulmonary embolus. Cardiomegaly, coronary artery disease. Stable chronic changes in the lungs with bronchiectasis and areas of scarring. Large hiatal hernia. Aortic Atherosclerosis (ICD10-I70.0). Electronically Signed   By: KRolm BaptiseM.D.   On: 11/17/2020 21:47   CARDIAC CATHETERIZATION  Result Date: 11/19/2020   Mid LAD lesion is 40% stenosed.   The left ventricular systolic function is normal.   LV end diastolic pressure is moderately elevated.   The left ventricular ejection fraction is 55-65% by visual estimate.   Hemodynamic findings consistent with mild pulmonary hypertension.   There is mild (2+) mitral regurgitation. 75year old female with paroxysmal nonvalvular atrial fibrillation previous known coronary artery atherosclerosis and hypertension hyperlipidemia with acute exacerbation of pulmonary concerns and hypoxia with elevated troponin Left ventricle with normal LV systolic function ejection fraction of 60% Mild pulmonary hypertension with mean pulmonary pressures of 29 mm Minimal left anterior descending artery atherosclerosis Plan Continue  medical management and treatment of exacerbation of lung disease High intensity cholesterol therapy for minimal coronary atherosclerosis Continuation of amiodarone and or beta-blocker for hypertension control and maintenance of normal sinus rhythm Reinstatement of anticoagulation for further risk reduction and stroke with atrial fibrillation Begin rehabilitation and no further cardiac intervention at this time   ECHOCARDIOGRAM COMPLETE  Result Date: 11/18/2020    ECHOCARDIOGRAM REPORT   Patient Name:   Ana GPARYS ELENBAASDate of Exam: 11/18/2020 Medical Rec #:  [id number]      Height:       66.0 in Accession #:    [id number]     Weight:       99.0 lb Date of Birth:  710-Apr-1947      BSA:          1.483 m Patient Age:    89years        BP:           132/68 mmHg Patient Gender: F               HR:           70 bpm. Exam Location:  [facility] Procedure: 2D Echo, Cardiac Doppler and Color Doppler Indications:     CHF-acute diastolic II34.74 History:         Patient has prior history of Echocardiogram examinations, most                  recent 04/30/2020. Previous Myocardial Infarction,  Arrythmias:Atrial Fibrillation; Risk Factors:Hypertension.  Sonographer:     Sherrie Sport Referring Phys:  [place] Diagnosing Phys: Serafina Royals MD  Sonographer Comments: Suboptimal apical window. IMPRESSIONS  1. Left ventricular ejection fraction, by estimation, is 50 to 55%. The left ventricle has low normal function. The left ventricle has no regional wall motion abnormalities. Left ventricular diastolic parameters were normal.  2. Right ventricular systolic function is normal. The right ventricular size is normal.  3. The mitral valve is normal in structure. Mild mitral valve regurgitation.  4. The aortic valve is normal in structure. Aortic valve regurgitation is mild. FINDINGS  Left Ventricle: Left ventricular ejection fraction, by estimation, is 50 to 55%. The left ventricle has low normal function. The  left ventricle has no regional wall motion abnormalities. The left ventricular internal cavity size was normal in size. There is no left ventricular hypertrophy. Left ventricular diastolic parameters were normal. Right Ventricle: The right ventricular size is normal. No increase in right ventricular wall thickness. Right ventricular systolic function is normal. Left Atrium: Left atrial size was normal in size. Right Atrium: Right atrial size was normal in size. Pericardium: There is no evidence of pericardial effusion. Mitral Valve: The mitral valve is normal in structure. Mild mitral valve regurgitation. Tricuspid Valve: The tricuspid valve is normal in structure. Tricuspid valve regurgitation is mild. Aortic Valve: The aortic valve is normal in structure. Aortic valve regurgitation is mild. Aortic valve mean gradient measures 4.0 mmHg. Aortic valve peak gradient measures 7.3 mmHg. Aortic valve area, by VTI measures 2.33 cm. Pulmonic Valve: The pulmonic valve was normal in structure. Pulmonic valve regurgitation is trivial. Aorta: The aortic root and ascending aorta are structurally normal, with no evidence of dilitation. IAS/Shunts: No atrial level shunt detected by color flow Doppler.  LEFT VENTRICLE PLAX 2D LVIDd:         3.55 cm   Diastology LVIDs:         2.60 cm   LV e' medial:    3.70 cm/s LV PW:         0.86 cm   LV E/e' medial:  16.7 LV IVS:        0.99 cm   LV e' lateral:   4.90 cm/s LVOT diam:     2.00 cm   LV E/e' lateral: 12.6 LV SV:         57 LV SV Index:   39 LVOT Area:     3.14 cm  RIGHT VENTRICLE RV Basal diam:  3.17 cm RV S prime:     14.10 cm/s TAPSE (M-mode): 3.8 cm LEFT ATRIUM             Index        RIGHT ATRIUM           Index LA diam:        2.30 cm 1.55 cm/m   RA Area:     20.50 cm LA Vol (A2C):   70.9 ml 47.80 ml/m  RA Volume:   62.40 ml  42.07 ml/m LA Vol (A4C):   54.3 ml 36.61 ml/m LA Biplane Vol: 61.8 ml 41.67 ml/m  AORTIC VALVE                    PULMONIC VALVE AV Area  (Vmax):    2.11 cm     PV Vmax:        0.80 m/s AV Area (Vmean):   2.06 cm  PV Peak grad:   2.6 mmHg AV Area (VTI):     2.33 cm     RVOT Peak grad: 3 mmHg AV Vmax:           135.00 cm/s AV Vmean:          90.100 cm/s AV VTI:            0.247 m AV Peak Grad:      7.3 mmHg AV Mean Grad:      4.0 mmHg LVOT Vmax:         90.70 cm/s LVOT Vmean:        59.000 cm/s LVOT VTI:          0.183 m LVOT/AV VTI ratio: 0.74  AORTA Ao Root diam: 3.00 cm MITRAL VALVE                TRICUSPID VALVE MV Area (PHT): 6.83 cm     TR Peak grad:   12.7 mmHg MV Decel Time: 111 msec     TR Vmax:        178.00 cm/s MV E velocity: 61.70 cm/s MV A velocity: 100.00 cm/s  SHUNTS MV E/A ratio:  0.62         Systemic VTI:  0.18 m                             Systemic Diam: 2.00 cm Serafina Royals MD Electronically signed by Serafina Royals MD Signature Date/Time: 11/18/2020/1:30:01 PM    Final    ASSESSMENT AND PLAN:  Ana Washington is a 75 y.o. 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  Chest pain with history of CAD/?NSTEMI -- on IV heparin drip -- cardiology consultation with Dr. Nehemiah Massed-- informed via secure chat -- hold eliquis -- continue losartan, beta-blockers, statins --10/20-- patient underwent cardiac cath   Mid LAD lesion is 40% stenosed.   The left ventricular systolic function is normal.   LV end diastolic pressure is moderately elevated.   The left ventricular ejection fraction is 55-65% by visual estimate.   Hemodynamic findings consistent with mild pulmonary hypertension.   There is mild  (2+) mitral regurgitation. Medical management recommended -- heparin drip discontinued  large hiatal hernia-- symptomatic history of TPN -- patient is scheduled to have surgery on November 1 at [facility] -- she currently is getting IV TPN through PICC line by [facility] for last 7 to 8 weeks to improve her nourishment prior to major surgery -- dietitian and pharmacy consultation for TPN  Gerd/reflux esophagitis -- continue PPI and Carafate  chronic atrial fibrillation on chronic anticoagulation -- continue metoprolol -- hold eliquis since patient was on IV heparin drip --10/20-- resume eliquis today--d/w pharmacy   Procedures: cath Family communication : none Consults : pulmonary CODE STATUS: DNR prior to admission DVT Prophylaxis : heparin drip Level of care: Progressive Cardiac Status is: Inpatient  Remains inpatient appropriate because: IV antibiotics for bronchiectasis and workup for non-STEMI        TOTAL TIME TAKING CARE OF THIS PATIENT: 25 minutes.  >  50% time spent on counselling and coordination of care  Note: This dictation was prepared with Dragon dictation along with smaller phrase technology. Any transcriptional errors that result from this process are unintentional.  Fritzi Mandes M.D    [facility]   CC: Primary care physician; [name], MD Patient ID: Ana Washington, female   DOB: 11-Apr-1945, 75 y.o.   MRN: [id number]

## 2020-11-19 NOTE — Progress Notes (Signed)
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 She is also with large left sided hiatal hernia which occupies her entire left hemithorax. She is with chronic hypoxemia due to both of these etiologies. She also has severe protein calorie malnutrition and is unable to improve due to cachexia. She was seen at [facility] not too long ago and is being optimized for surgical repair of this giant hiatal hernia which is precluding normal enteric feeding and normal respiration. She is now with acute exacerbation of her bronchiectasis.   11/19/20-  no acute events overnight. Spoke with daughter and sister of patient today.  She is s/p cardiac cath today  PAST MEDICAL HISTORY   Past Medical History:  Diagnosis Date   Arthritis    Atrial fibrillation ([place])    Bronchiectasis (HCC)    CAD (coronary artery disease) 07/30/2017   CHF (congestive heart failure) (HCC)    Dumping syndrome    Essential hypertension, malignant 10/03/2013   Family history of adverse reaction to anesthesia    sister [name]   GERD (gastroesophageal reflux disease)    Headache    MIGRAINES   Myocardial infarction ([place]) 2007   Non-STEMI   [name] (postoperative nausea and vomiting)    Psoriasis    PUD (peptic ulcer disease)      SURGICAL HISTORY   Past Surgical History:  Procedure Laterality Date   BACK SURGERY     CHOLECYSTECTOMY     ESOPHAGOGASTRODUODENOSCOPY N/A 09/04/2020   Procedure: ESOPHAGOGASTRODUODENOSCOPY (EGD);  Surgeon: Virgel Manifold, MD;  Location[facility] END[facility]COPY;  Service: Endoscopy;  Laterality: N/A;    ESOPHAGOGASTRODUODENOSCOPY (EGD) WITH PROPOFOL N/A 03/19/2019   Procedure: ESOPHAGOGASTRODUODENOSCOPY (EGD) WITH PROPOFOL;  Surgeon: [name], MD;  Location: [facility] ENDOSCOPY;  Service: Gastroenterology;  Laterality: N/A;  *Note to anesthesia: Per pt's pulmonologist, if intubating, please extubate to BIPAP.   EYE SURGERY     FOOT SURGERY     INTRAMEDULLARY (IM) NAIL INTERTROCHANTERIC Right 09/30/2018   Procedure: INTRAMEDULLARY (IM) NAIL INTERTROCHANTRIC;  Surgeon: Dereck Leep, MD;  Location: [facility] ORS;  Service: Orthopedics;  Laterality: Right;   KYPHOPLASTY N/A 07/05/2016   Procedure: KYPHOPLASTY T - 9;  Surgeon: Hessie Knows, MD;  Location: [facility] ORS;  Service: Orthopedics;  Laterality: N/A;   KYPHOPLASTY N/A 11/29/2017   Procedure: Iona Hansen;  Surgeon: Hessie Knows, MD;  Location: [facility] ORS;  Service: Orthopedics;  Laterality: N/A;  L2 and L3   KYPHOPLASTY N/A 12/18/2017   Procedure: KYPHOPLASTY L1;  Surgeon: Hessie Knows, MD;  Location: [facility] ORS;  Service: Orthopedics;  Laterality: N/A;   KYPHOPLASTY N/A 01/05/2018   Procedure: KYPHOPLASTY-T11,T12;  Surgeon: Hessie Knows, MD;  Location: [facility] ORS;  Service: Orthopedics;  Laterality: N/A;   KYPHOPLASTY N/A 04/05/2018   Procedure: T10 KYPHOPLASTY;  Surgeon: Hessie Knows, MD;  Location: [facility] ORS;  Service: Orthopedics;  Laterality: N/A;   KYPHOPLASTY N/A 04/12/2018   Procedure: KYPHOPLASTY T7,8;  Surgeon: Hessie Knows, MD;  Location:  [facility] ORS;  Service: Orthopedics;  Laterality: N/A;   KYPHOPLASTY N/A 04/19/2018   Procedure: KYPHOPLASTY T5, T6;  Surgeon: Hessie Knows, MD;  Location: [facility] ORS;  Service: Orthopedics;  Laterality: N/A;   LUNG SURGERY  1990 and 1996   THOROCOTOMY WITH LOBECTOMY     LEFT LOWER THORACOTOMY / RIGHT MIDDLE LOBECTOMY     FAMILY HISTORY   Family History  Problem Relation Age of Onset   Hypertension Mother    Hypertension Father      SOCIAL HISTORY   Social History   Tobacco Use   Smoking  status: Never   Smokeless tobacco: Never  Vaping Use   Vaping Use: Never used  Substance Use Topics   Alcohol use: No   Drug use: No     MEDICATIONS    Home Medication:  REM   Current Medication:  Current Facility-Administered Medications:    0.9 %  sodium chloride infusion, 250 mL, Intravenous, PRN, Corey Skains, MD   [START ON 11/20/2020] 0.9% sodium chloride infusion, 3 mL/kg/hr, Intravenous, Continuous **FOLLOWED BY** [START ON 11/20/2020] 0.9% sodium chloride infusion, 1 mL/kg/hr, Intravenous, Continuous, Corey Skains, MD   0.9% sodium chloride infusion, 1 mL/kg/hr, Intravenous, Continuous, Corey Skains, MD   acetaminophen (TYLENOL) tablet 650 mg, 650 mg, Oral, Q4H PRN, Corey Skains, MD   [MAR Hold] acetylcysteine (MUCOMYST) 20 % nebulizer / oral solution 4 mL, 4 mL, Nebulization, Daily, Fritzi Mandes, MD   [facility] Hold] amiodarone (PACERONE) tablet 200 mg, 200 mg, Oral, BID, Florina Ou V, MD, 200 mg at 11/18/20 2116   [facility] Hold] ascorbic acid (VITAMIN C) tablet 1,000 mg, 1,000 mg, Oral, Daily, Florina Ou V, MD, 1,000 mg at 11/18/20 1051   [START ON 11/20/2020] aspirin chewable tablet 81 mg, 81 mg, Oral, Pre-Cath, Corey Skains, MD   [MAR Hold] aspirin EC tablet 81 mg, 81 mg, Oral, Daily, Florina Ou V, MD, 81 mg at 11/19/20 0944   [MAR Hold] atorvastatin (LIPITOR) tablet 40 mg, 40 mg, Oral, Daily, Florina Ou V, MD, 40 mg at 11/18/20 1053   [MAR Hold] benzonatate (TESSALON) capsule 200 mg, 200 mg, Oral, TID PRN, Para Skeans, MD   [MAR Hold] Chlorhexidine Gluconate Cloth 2 % PADS 6 each, 6 each, Topical, Daily, Posey Pronto, Gretta Cool, MD   [MAR Hold] digoxin (LANOXIN) tablet 62.5 mcg, 62.5 mcg, Oral, Daily, Fritzi Mandes, MD, 62.5 mcg at 11/18/20 1054   [MAR Hold] ferrous sulfate tablet 325 mg, 325 mg, Oral, Daily, Fritzi Mandes, MD   [facility] Hold] furosemide (LASIX) tablet 40 mg, 40 mg, Oral, Daily, Fritzi Mandes, MD, 40 mg at 11/18/20 1714   [MAR Hold] gabapentin  (NEURONTIN) capsule 300 mg, 300 mg, Oral, QHS PRN, Para Skeans, MD, 300 mg at 11/18/20 1938   Heparin (Porcine) in NaCl 1000-0.9 UT/500ML-% SOLN, , , PRN, Corey Skains, MD, 1,000 mL at 11/19/20 1215   heparin ADULT infusion 100 units/mL (25000 units/21m), 800 Units/hr, Intravenous, Continuous, GDallie Piles RPH, Stopped at 11/19/20 1120   hydrALAZINE (APRESOLINE) injection 10 mg, 10 mg, Intravenous, Q20 Min PRN, KCorey Skains MD   [MAR Hold] insulin aspart (novoLOG) injection 0-9 Units, 0-9 Units, Subcutaneous, Q4H, Deal, Brandon G, RPH   iohexol (OMNIPAQUE) 350 MG/ML injection, , , PRN, KCorey Skains MD, 66 mL at 11/19/20 1249   [MAR Hold] ipratropium-albuterol (DUONEB) 0.5-2.5 (3) MG/3ML nebulizer solution 6 mL, 6 mL, Nebulization, Q6H PRN, PPara Skeans MD, 6 mL at 11/19/20  7829   labetalol (NORMODYNE) injection 10 mg, 10 mg, Intravenous, Q10 min PRN, Corey Skains, MD   [MAR Hold] levofloxacin (LEVAQUIN) IVPB 750 mg, 750 mg, Intravenous, Q48H, Fritzi Mandes, MD   lidocaine (PF) (XYLOCAINE) 1 % injection, , , PRN, Corey Skains, MD, 30 mL at 11/19/20 1215   [MAR Hold] LORazepam (ATIVAN) tablet 0.5 mg, 0.5 mg, Oral, QHS, Florina Ou V, MD, 0.5 mg at 11/18/20 2116   [facility] Hold] losartan (COZAAR) tablet 100 mg, 100 mg, Oral, Daily, Florina Ou V, MD, 100 mg at 11/18/20 1053   [MAR Hold] methylPREDNISolone sodium succinate (SOLU-MEDROL) 125 mg/2 mL injection 80 mg, 80 mg, Intravenous, Q24H, Ottie Glazier, MD   [facility] Hold] metoprolol succinate (TOPROL-XL) 24 hr tablet 25 mg, 25 mg, Oral, Daily, Florina Ou V, MD, 25 mg at 11/19/20 0943   midazolam (VERSED) injection, , , PRN, Corey Skains, MD, 0.5 mg at 11/19/20 1213   [MAR Hold] morphine 2 MG/ML injection 0.5 mg, 0.5 mg, Intravenous, Q6H PRN, Para Skeans, MD   [MAR Hold] multivitamin with minerals tablet 1 tablet, 1 tablet, Oral, Daily, Fritzi Mandes, MD   [facility] Hold] nitroGLYCERIN (NITROSTAT) SL tablet 0.4 mg, 0.4  mg, Sublingual, Q5 Min x 3 PRN, Para Skeans, MD   ondansetron [facility]) injection 4 mg, 4 mg, Intravenous, Q6H PRN, Corey Skains, MD   [MAR Hold] pantoprazole (PROTONIX) EC tablet 40 mg, 40 mg, Oral, BID AC, Fritzi Mandes, MD, 40 mg at 11/18/20 1714   [MAR Hold] senna (SENOKOT) tablet 17.2 mg, 2 tablet, Oral, QHS, Fritzi Mandes, MD, 17.2 mg at 11/18/20 2116   [facility] Hold] sodium chloride flush (NS) 0.9 % injection 10-40 mL, 10-40 mL, Intracatheter, Q12H, Florina Ou V, MD, 10 mL at 11/18/20 2124   [facility] Hold] sodium chloride flush (NS) 0.9 % injection 10-40 mL, 10-40 mL, Intracatheter, PRN, Para Skeans, MD   [facility] Hold] sodium chloride flush (NS) 0.9 % injection 3 mL, 3 mL, Intravenous, Q12H, Corey Skains, MD, 3 mL at 11/18/20 2125   sodium chloride flush (NS) 0.9 % injection 3 mL, 3 mL, Intravenous, PRN, Corey Skains, MD   [MAR Hold] sucralfate (CARAFATE) 1 GM/10ML suspension 1 g, 1 g, Oral, Q6H, Fritzi Mandes, MD, 1 g at 11/19/20 0139   TPN ADULT (ION), , Intravenous, Continuous TPN, Deal, Justice Britain, RPH    ALLERGIES   Codeine, Sulfa antibiotics, and Penicillins     REVIEW OF SYSTEMS    Review of Systems:  Gen:  Denies  fever, sweats, chills weigh loss  HEENT: Denies blurred vision, double vision, ear pain, eye pain, hearing loss, nose bleeds, sore throat Cardiac:  No dizziness, chest pain or heaviness, chest tightness,edema Resp:   reports severe dyspnea cough which is productive of thick yellow/brown phlegm Gi: admits to NVD Gu:  Denies bladder incontinence, burning urine Ext:   Denies Joint pain, stiffness or swelling Skin: Denies  skin rash, easy bruising or bleeding or hives Endoc:  Denies polyuria, polydipsia , polyphagia or weight change Psych:   Denies depression, insomnia or hallucinations   Other:  All other systems negative   VS: BP (!) 151/83   Pulse 77   Temp 98.2 F (36.8 C) (Oral)   Resp (!) 22   Ht _0  (1.676 m)   Wt 44.9 kg   SpO2 100%   BMI  15.98 kg/m      PHYSICAL EXAM    GENERAL:NAD, no fevers, chills, no  weakness no fatigue HEAD: Normocephalic, atraumatic.  EYES: Pupils equal, round, reactive to light. Extraocular muscles intact. No scleral icterus.  MOUTH: Moist mucosal membrane. Dentition intact. No abscess noted.  EAR, NOSE, THROAT: Clear without exudates. No external lesions.  NECK: Supple. No thyromegaly. No nodules. No JVD.  PULMONARY: Diffuse coarse rhonchi bilaterally CARDIOVASCULAR: S1 and S2. Regular rate and rhythm. No murmurs, rubs, or gallops. No edema. Pedal pulses 2+ bilaterally.  GASTROINTESTINAL: Soft, nontender, nondistended. No masses. Positive bowel sounds. No hepatosplenomegaly.  MUSCULOSKELETAL: No swelling, clubbing, or edema. Range of motion full in all extremities.  NEUROLOGIC: Cranial nerves II through XII are intact. No gross focal neurological deficits. Sensation intact. Reflexes intact.  SKIN: No ulceration, lesions, rashes, or cyanosis. Skin warm and dry. Turgor intact.  PSYCHIATRIC: Mood, affect within normal limits. The patient is awake, alert and oriented x 3. Insight, judgment intact.       IMAGING    DG Chest 2 View  Result Date: 11/17/2020 CLINICAL DATA:  Shortness of breath.  Chest pain. EXAM: CHEST - 2 VIEW COMPARISON:  09/13/2020 FINDINGS: Heart size remains mildly enlarged. Chronic atherosclerosis of the aorta. Chronic emphysema and pulmonary scarring with pulmonary resection in the right upper lung and presumed lobectomy on the left. Chronic elevation of the left hemidiaphragm. No sign of acute infiltrate. Multiple previous vertebral augmentations. Right arm PICC tip at the SVC RA junction. IMPRESSION: Previous pulmonary resection is a. chronic elevation of the left hemidiaphragm. Emphysema and pulmonary scarring as seen previously. No active process identified. PICC tip at the SVC RA junction. Electronically Signed   By: Nelson Chimes M.D.   On: 11/17/2020 14:45   CT Angio  Chest PE W and/or Wo Contrast  Result Date: 11/17/2020 CLINICAL DATA:  chronic bronchiectasis, on antibiotics, worsening respiratory symptoms, pleuritic chest pains. eval infiltrate, effusion, PE EXAM: CT ANGIOGRAPHY CHEST WITH CONTRAST TECHNIQUE: Multidetector CT imaging of the chest was performed using the standard protocol during bolus administration of intravenous contrast. Multiplanar CT image reconstructions and MIPs were obtained to evaluate the vascular anatomy. CONTRAST:  77m OMNIPAQUE IOHEXOL 350 MG/ML SOLN COMPARISON:  09/14/2020 FINDINGS: Cardiovascular: No filling defects in the pulmonary arteries to suggest pulmonary emboli. Cardiomegaly. Multi-vessel coronary artery calcifications. Tortuous calcified aorta. No aneurysm. Mediastinum/Nodes: No mediastinal, hilar, or axillary adenopathy. Large hiatal hernia again noted, unchanged. Lungs/Pleura: Postoperative changes bilaterally. Bronchiectasis again seen bilaterally, right greater than left. Areas of scarring in the lungs. No pleural effusion or acute areas of consolidation. Upper Abdomen: Imaging into the upper abdomen demonstrates no acute findings. Musculoskeletal: Multilevel vertebroplasty changes throughout the thoracic and upper lumbar spine, unchanged. Review of the MIP images confirms the above findings. IMPRESSION: No evidence of pulmonary embolus. Cardiomegaly, coronary artery disease. Stable chronic changes in the lungs with bronchiectasis and areas of scarring. Large hiatal hernia. Aortic Atherosclerosis (ICD10-I70.0). Electronically Signed   By: KRolm BaptiseM.D.   On: 11/17/2020 21:47   CARDIAC CATHETERIZATION  Result Date: 11/19/2020   Mid LAD lesion is 40% stenosed.   The left ventricular systolic function is normal.   LV end diastolic pressure is moderately elevated.   The left ventricular ejection fraction is 55-65% by visual estimate.   Hemodynamic findings consistent with mild pulmonary hypertension.   There is mild (2+)  mitral regurgitation. 75year old female with paroxysmal nonvalvular atrial fibrillation previous known coronary artery atherosclerosis and hypertension hyperlipidemia with acute exacerbation of pulmonary concerns and hypoxia with elevated troponin Left ventricle with normal LV systolic function ejection fraction  of 60% Mild pulmonary hypertension with mean pulmonary pressures of 29 mm Minimal left anterior descending artery atherosclerosis Plan Continue medical management and treatment of exacerbation of lung disease High intensity cholesterol therapy for minimal coronary atherosclerosis Continuation of amiodarone and or beta-blocker for hypertension control and maintenance of normal sinus rhythm Reinstatement of anticoagulation for further risk reduction and stroke with atrial fibrillation Begin rehabilitation and no further cardiac intervention at this time   ECHOCARDIOGRAM COMPLETE  Result Date: 11/18/2020    ECHOCARDIOGRAM REPORT   Patient Name:   Ana Washington Date of Exam: 11/18/2020 Medical Rec #:  [id number]       Height:       66.0 in Accession #:    [id number]      Weight:       99.0 lb Date of Birth:  Jun 02, 1945       BSA:          1.483 m Patient Age:    59 years        BP:           132/68 mmHg Patient Gender: F               HR:           70 bpm. Exam Location:  [facility] Procedure: 2D Echo, Cardiac Doppler and Color Doppler Indications:     CHF-acute diastolic U88.28  History:         Patient has prior history of Echocardiogram examinations, most                  recent 04/30/2020. Previous Myocardial Infarction,                  Arrythmias:Atrial Fibrillation; Risk Factors:Hypertension.  Sonographer:     Sherrie Sport Referring Phys:  Miranda Diagnosing Phys: Serafina Royals MD  Sonographer Comments: Suboptimal apical window. IMPRESSIONS  1. Left ventricular ejection fraction, by estimation, is 50 to 55%. The left ventricle has low normal function. The left ventricle has no regional wall  motion abnormalities. Left ventricular diastolic parameters were normal.  2. Right ventricular systolic function is normal. The right ventricular size is normal.  3. The mitral valve is normal in structure. Mild mitral valve regurgitation.  4. The aortic valve is normal in structure. Aortic valve regurgitation is mild. FINDINGS  Left Ventricle: Left ventricular ejection fraction, by estimation, is 50 to 55%. The left ventricle has low normal function. The left ventricle has no regional wall motion abnormalities. The left ventricular internal cavity size was normal in size. There is no left ventricular hypertrophy. Left ventricular diastolic parameters were normal. Right Ventricle: The right ventricular size is normal. No increase in right ventricular wall thickness. Right ventricular systolic function is normal. Left Atrium: Left atrial size was normal in size. Right Atrium: Right atrial size was normal in size. Pericardium: There is no evidence of pericardial effusion. Mitral Valve: The mitral valve is normal in structure. Mild mitral valve regurgitation. Tricuspid Valve: The tricuspid valve is normal in structure. Tricuspid valve regurgitation is mild. Aortic Valve: The aortic valve is normal in structure. Aortic valve regurgitation is mild. Aortic valve mean gradient measures 4.0 mmHg. Aortic valve peak gradient measures 7.3 mmHg. Aortic valve area, by VTI measures 2.33 cm. Pulmonic Valve: The pulmonic valve was normal in structure. Pulmonic valve regurgitation is trivial. Aorta: The aortic root and ascending aorta are structurally normal, with no evidence of dilitation. IAS/Shunts: No atrial level shunt  detected by color flow Doppler.  LEFT VENTRICLE PLAX 2D LVIDd:         3.55 cm   Diastology LVIDs:         2.60 cm   LV e' medial:    3.70 cm/s LV PW:         0.86 cm   LV E/e' medial:  16.7 LV IVS:        0.99 cm   LV e' lateral:   4.90 cm/s LVOT diam:     2.00 cm   LV E/e' lateral: 12.6 LV SV:         57 LV SV  Index:   39 LVOT Area:     3.14 cm  RIGHT VENTRICLE RV Basal diam:  3.17 cm RV S prime:     14.10 cm/s TAPSE (M-mode): 3.8 cm LEFT ATRIUM             Index        RIGHT ATRIUM           Index LA diam:        2.30 cm 1.55 cm/m   RA Area:     20.50 cm LA Vol (A2C):   70.9 ml 47.80 ml/m  RA Volume:   62.40 ml  42.07 ml/m LA Vol (A4C):   54.3 ml 36.61 ml/m LA Biplane Vol: 61.8 ml 41.67 ml/m  AORTIC VALVE                    PULMONIC VALVE AV Area (Vmax):    2.11 cm     PV Vmax:        0.80 m/s AV Area (Vmean):   2.06 cm     PV Peak grad:   2.6 mmHg AV Area (VTI):     2.33 cm     RVOT Peak grad: 3 mmHg AV Vmax:           135.00 cm/s AV Vmean:          90.100 cm/s AV VTI:            0.247 m AV Peak Grad:      7.3 mmHg AV Mean Grad:      4.0 mmHg LVOT Vmax:         90.70 cm/s LVOT Vmean:        59.000 cm/s LVOT VTI:          0.183 m LVOT/AV VTI ratio: 0.74  AORTA Ao Root diam: 3.00 cm MITRAL VALVE                TRICUSPID VALVE MV Area (PHT): 6.83 cm     TR Peak grad:   12.7 mmHg MV Decel Time: 111 msec     TR Vmax:        178.00 cm/s MV E velocity: 61.70 cm/s MV A velocity: 100.00 cm/s  SHUNTS MV E/A ratio:  0.62         Systemic VTI:  0.18 m                             Systemic Diam: 2.00 cm Serafina Royals MD Electronically signed by Serafina Royals MD Signature Date/Time: 11/18/2020/1:30:01 PM    Final       ASSESSMENT/PLAN   Acute on chronic hypoxemic respiratory failure due to Acute exacerbation of COPD  -IV levofloxacin -solumedrol weaning to 40 iv daily  -monitor renal dosing due  to ckd - BPH with IS at bedside -flutter valve -PT/OT -telemetry monitoring -MRSA nasal pCR -procalcintonin   Chest pain with acute coronary syndrome  - appreciate cardiology collboration - for LHC today    Severe Left lung atelectasis    - plan for hiatal hernia repair soon after medical optimization of nutritional impairment  Severe protein calorie malnutrition - patient is on TPN via PICC     Thank  you for allowing me to participate in the care of this patient.  Total face to face encounter time for this patient visit was >45 min. >50% of the time was  spent in counseling and coordination of care.   Patient/Family are satisfied with care plan and all questions have been answered.  This document was prepared using Dragon voice recognition software and may include unintentional dictation errors.     Ottie Glazier, M.D.  Division of [place]

## 2020-11-20 DIAGNOSIS — E43 Unspecified severe protein-calorie malnutrition: Secondary | ICD-10-CM | POA: Insufficient documentation

## 2020-11-20 LAB — COMPREHENSIVE METABOLIC PANEL
ALT: 26 U/L (ref 0–44)
AST: 31 U/L (ref 15–41)
Albumin: 2.1 g/dL — ABNORMAL LOW (ref 3.5–5.0)
Alkaline Phosphatase: 88 U/L (ref 38–126)
Anion gap: 7 (ref 5–15)
BUN: 40 mg/dL — ABNORMAL HIGH (ref 8–23)
CO2: 36 mmol/L — ABNORMAL HIGH (ref 22–32)
Calcium: 8.6 mg/dL — ABNORMAL LOW (ref 8.9–10.3)
Chloride: 93 mmol/L — ABNORMAL LOW (ref 98–111)
Creatinine, Ser: 0.73 mg/dL (ref 0.44–1.00)
GFR, Estimated: >60 mL/min (ref >60)
Glucose, Bld: 126 mg/dL — ABNORMAL HIGH (ref 70–99)
Potassium: 3.6 mmol/L (ref 3.5–5.1)
Sodium: 136 mmol/L (ref 135–145)
Total Bilirubin: 0.4 mg/dL (ref 0.3–1.2)
Total Protein: 4.8 g/dL — ABNORMAL LOW (ref 6.5–8.1)

## 2020-11-20 LAB — GLUCOSE, CAPILLARY
Glucose-Capillary: 131 mg/dL — ABNORMAL HIGH (ref 70–99)
Glucose-Capillary: 132 mg/dL — ABNORMAL HIGH (ref 70–99)
Glucose-Capillary: 133 mg/dL — ABNORMAL HIGH (ref 70–99)
Glucose-Capillary: 137 mg/dL — ABNORMAL HIGH (ref 70–99)
Glucose-Capillary: 140 mg/dL — ABNORMAL HIGH (ref 70–99)
Glucose-Capillary: 157 mg/dL — ABNORMAL HIGH (ref 70–99)
Glucose-Capillary: 94 mg/dL (ref 70–99)

## 2020-11-20 LAB — PHOSPHORUS: Phosphorus: 3.4 mg/dL (ref 2.5–4.6)

## 2020-11-20 LAB — MAGNESIUM: Magnesium: 1.8 mg/dL (ref 1.7–2.4)

## 2020-11-20 MED ORDER — SALINE SPRAY 0.65 % NA SOLN
1.0000 | NASAL | Status: DC | PRN
  Administered 2020-11-20: 1 via NASAL
  Filled 2020-11-20 (×2): qty 44

## 2020-11-20 MED ORDER — IPRATROPIUM-ALBUTEROL 0.5-2.5 (3) MG/3ML IN SOLN
6.0000 mL | Freq: Three times a day (TID) | RESPIRATORY_TRACT | Status: DC
  Administered 2020-11-20 – 2020-11-21 (×4): 6 mL via RESPIRATORY_TRACT
  Filled 2020-11-20 (×4): qty 3

## 2020-11-20 MED ORDER — METHYLPREDNISOLONE SODIUM SUCC 40 MG IJ SOLR
40.0000 mg | INTRAMUSCULAR | Status: DC
  Administered 2020-11-20 – 2020-11-21 (×2): 40 mg via INTRAVENOUS
  Filled 2020-11-20 (×2): qty 1

## 2020-11-20 MED ORDER — TRACE MINERALS CU-MN-SE-ZN 300-55-60-3000 MCG/ML IV SOLN
INTRAVENOUS | Status: DC
  Filled 2020-11-20: qty 528

## 2020-11-20 MED ORDER — POLYETHYLENE GLYCOL 3350 17 G PO PACK
17.0000 g | PACK | Freq: Every day | ORAL | Status: DC
  Administered 2020-11-20: 17 g via ORAL
  Filled 2020-11-20 (×2): qty 1

## 2020-11-20 MED ORDER — LEVOFLOXACIN IN D5W 500 MG/100ML IV SOLN: 500.0000 mg | INTRAVENOUS | Status: DC

## 2020-11-20 MED ORDER — LEVOFLOXACIN IN D5W 500 MG/100ML IV SOLN
500.0000 mg | INTRAVENOUS | Status: AC
Start: 2020-11-20 — End: 2020-11-21
  Administered 2020-11-20 – 2020-11-21 (×2): 500 mg via INTRAVENOUS
  Filled 2020-11-20 (×2): qty 100

## 2020-11-20 NOTE — Evaluation (Signed)
Physical Therapy Evaluation Patient Details Name: Ana Washington MRN: [id number] DOB: 25-Apr-1945 Today's Date: 11/20/2020  History of Present Illness  75yo F with PMH of chronic bronchiectasis on 2 L home oxygen, atrial fibrillation on eliquis, CAD, CHF, large hiatal hernia who presented to hospital for chest pain and SOB. Pt is scheduled for surgery on Nov 1st at [facility] for large hiatal hernia, pt is receiving TPN to improve nourishment prior to surgery. Pharmacy was consulted for TPN management.  Clinical Impression  Pt is a pleasant 75 year old female who was admitted for ches pain and is s/p cardiac cath on 11/19/20. Pt performs bed mobility, transfers, and ambulation with cga and RW. At end of ambulation, rates RPE at 8/10 with increased SOB symptoms. O2 sats decreased to 82% on baseline O2 (2L). Increased to 3L of O2 with sats improving to 94%. Pt demonstrates deficits with strength/endurance/mobility. Would benefit from skilled PT to address above deficits and promote optimal return to PLOF. Recommend transition to [place] upon discharge from acute hospitalization.      Recommendations for follow up therapy are one component of a multi-disciplinary discharge planning process, led by the attending physician.  Recommendations may be updated based on patient status, additional functional criteria and insurance authorization.  Follow Up Recommendations Home health PT    Equipment Recommendations  None recommended by PT    Recommendations for Other Services       Precautions / Restrictions Precautions Precautions: Fall Restrictions Weight Bearing Restrictions: No      Mobility  Bed Mobility Overal bed mobility: Needs Assistance Bed Mobility: Supine to Sit;Sit to Supine     Supine to sit: Min guard Sit to supine: Min guard   General bed mobility comments: becomes SOB with transition to EOB.    Transfers Overall transfer level: Needs assistance Equipment used: Rolling walker (2  wheeled) Transfers: Sit to/from [facility] Sit to Stand: Min guard         General transfer comment: safe technique and demonstrates upright posture once standing. RW used  Ambulation/Gait Ambulation/Gait assistance: Counsellor (Feet): 20 Feet Assistive device: Rolling walker (2 wheeled) Gait Pattern/deviations: Step-to pattern     General Gait Details: ambulated short distances in room with RW. O2 donned with sats decreasing to 82% with exertion. O2 increased to 3L of O2 with ats improving to 94%. HR at 90bpm post exertion  Stairs            Wheelchair Mobility    Modified Rankin (Stroke Patients Only)       Balance Overall balance assessment: Needs assistance Sitting-balance support: Feet supported;Bilateral upper extremity supported Sitting balance-Leahy Scale: Good     Standing balance support: During functional activity Standing balance-Leahy Scale: Fair                               Pertinent Vitals/Pain Pain Assessment: No/denies pain    Home Living Family/patient expects to be discharged to:: Private residence Living Arrangements: Spouse/significant other Available Help at Discharge: Family;Personal care attendant;Available PRN/intermittently Type of Home: House Home Access: Ramped entrance     Home Layout: One level Home Equipment: Walker - 2 wheels;Bedside commode;Tub bench;Wheelchair - Comptroller - 4 wheels Additional Comments: patient on 2 L 02 at home per her report    Prior Function Level of Independence: Needs assistance   Gait / Transfers Assistance Needed: short distance ambulation with rollator and  2 L02, limited distance.  ADL's / Homemaking Assistance Needed: Pt reports aide coming from 9am- 1 pm and assisting pt with bed bath and IADLs in the home. 5x/wk  Comments: pt denies recent falls     Hand Dominance        Extremity/Trunk Assessment   Upper Extremity  Assessment Upper Extremity Assessment: Generalized weakness    Lower Extremity Assessment Lower Extremity Assessment: Generalized weakness (B LE grossly 4/5)       Communication   Communication: No difficulties  Cognition Arousal/Alertness: Awake/alert Behavior During Therapy: Anxious Overall Cognitive Status: Within Functional Limits for tasks assessed                                 General Comments: reports she has a tight chest despite just receiving breathing treatment      General Comments      Exercises     Assessment/Plan    PT Assessment Patient needs continued PT services  PT Problem List Decreased strength;Decreased activity tolerance;Decreased balance;Decreased mobility;Cardiopulmonary status limiting activity       PT Treatment Interventions DME instruction;Gait training;Therapeutic exercise;Balance training    PT Goals (Current goals can be found in the Care Plan section)  Acute Rehab PT Goals Patient Stated Goal: to feel better PT Goal Formulation: With patient Time For Goal Achievement: 12/04/20 Potential to Achieve Goals: Good    Frequency Min 2X/week   Barriers to discharge        Co-evaluation               AM-PAC PT "6 Clicks" Mobility  Outcome Measure Help needed turning from your back to your side while in a flat bed without using bedrails?: A Little Help needed moving from lying on your back to sitting on the side of a flat bed without using bedrails?: A Little Help needed moving to and from a bed to a chair (including a wheelchair)?: A Little Help needed standing up from a chair using your arms (e.g., wheelchair or bedside chair)?: A Little Help needed to walk in hospital room?: A Little Help needed climbing 3-5 steps with a railing? : A Little 6 Click Score: 18    End of Session Equipment Utilized During Treatment: Gait belt;Oxygen Activity Tolerance:  (limited by breathing) Patient left: in bed;with bed alarm  set Nurse Communication: Mobility status PT Visit Diagnosis: Muscle weakness (generalized) (M62.81);Difficulty in walking, not elsewhere classified (R26.2)    Time: 9675-9163 PT Time Calculation (min) (ACUTE ONLY): 25 min   Charges:   PT Evaluation $PT Eval Low Complexity: 1 Low PT Treatments $Gait Training: 8-22 mins        Greggory Stallion, PT, DPT [phone]   Ana Washington 11/20/2020, 4:27 PM

## 2020-11-20 NOTE — Evaluation (Signed)
Occupational Therapy Evaluation Patient Details Name: Ana Washington MRN: [id number] DOB: 27-Aug-1945 Today's Date: 11/20/2020   History of Present Illness 75yo F with PMH of chronic bronchiectasis on 2 L home oxygen, atrial fibrillation on eliquis, CAD, CHF, large hiatal hernia who presented to hospital for chest pain and SOB. Pt is scheduled for surgery on Nov 1st at [facility] for large hiatal hernia, pt is receiving TPN to improve nourishment prior to surgery. Pharmacy was consulted for TPN management.   Clinical Impression   Patient presenting with decreased Ind in self care, balance, functional mobility/transfers, endurance, and safety awareness. Patient reports needing assistance at home for self care, IADLs, and mobility. Pt has an aide from 9am-1pm 5x/wk and sister assists as well at baseline. Pt needing min guard - Min A for bed mobility, functional transfers, and toileting task with use of RW this session. Pt does endorse feeling weak while up. She is on 2 L O2 via [place] at baseline and increased to 3Ls this session with functional tasks secondary to desaturation to 86% and unable to recover. Patient will benefit from acute OT to increase overall independence in the areas of ADLs, functional mobility, and safety awareness in order to safely discharge to next venue of care.       Recommendations for follow up therapy are one component of a multi-disciplinary discharge planning process, led by the attending physician.  Recommendations may be updated based on patient status, additional functional criteria and insurance authorization.   Follow Up Recommendations  Home health OT;Supervision - Intermittent    Equipment Recommendations  None recommended by OT       Precautions / Restrictions Precautions Precautions: Fall Restrictions Weight Bearing Restrictions: No      Mobility Bed Mobility Overal bed mobility: Needs Assistance Bed Mobility: Supine to Sit;Sit to Supine     Supine to sit:  Min guard Sit to supine: Min guard   General bed mobility comments: increased time and cuing for technique    Transfers Overall transfer level: Needs assistance Equipment used: Rolling walker (2 wheeled) Transfers: Sit to/from [facility] Sit to Stand: Min guard Stand pivot transfers: Min assist            Balance Overall balance assessment: Needs assistance Sitting-balance support: Feet supported;Bilateral upper extremity supported Sitting balance-Leahy Scale: Good     Standing balance support: During functional activity Standing balance-Leahy Scale: Fair Standing balance comment: UE support                           ADL either performed or assessed with clinical judgement   ADL Overall ADL's : Needs assistance/impaired     Grooming: Wash/dry hands;Wash/dry face;Sitting;Set up                   Toilet Transfer: Minimal assistance;BSC;RW   Toileting- Clothing Manipulation and Hygiene: Minimal assistance;Sit to/from stand               Vision Baseline Vision/History: 1 Wears glasses Patient Visual Report: No change from baseline              Pertinent Vitals/Pain Pain Assessment: No/denies pain     Hand Dominance Right   Extremity/Trunk Assessment Upper Extremity Assessment Upper Extremity Assessment: Generalized weakness   Lower Extremity Assessment Lower Extremity Assessment: Generalized weakness       Communication Communication Communication: No difficulties   Cognition Arousal/Alertness: Awake/alert Behavior During Therapy: WFL for tasks assessed/performed  Overall Cognitive Status: Within Functional Limits for tasks assessed                                                Home Living Family/patient expects to be discharged to:: Private residence Living Arrangements: Spouse/significant other Available Help at Discharge: Family;Personal care attendant;Available  PRN/intermittently Type of Home: House Home Access: Ramped entrance     Home Layout: One level     Bathroom Shower/Tub: Teacher, early years/pre: Handicapped height     Home Equipment: Environmental consultant - 2 wheels;Bedside commode;Tub bench;Wheelchair - Comptroller - 4 wheels   Additional Comments: patient on 2 L 02 at home per her report      Prior Functioning/Environment Level of Independence: Needs assistance  Gait / Transfers Assistance Needed: short distance ambulation with rollator and 2 L02, limited distance. ADL's / Homemaking Assistance Needed: Pt reports aide coming from 9am- 1 pm and assisting pt with bed bath and IADLs in the home. 5x/wk   Comments: pt denies recent falls        OT Problem List: Decreased strength;Decreased activity tolerance;Impaired balance (sitting and/or standing);Decreased safety awareness;Cardiopulmonary status limiting activity      OT Treatment/Interventions: Self-care/ADL training;Manual therapy;Therapeutic exercise;Patient/family education;Balance training;Energy conservation;Therapeutic activities;DME and/or AE instruction    OT Goals(Current goals can be found in the care plan section) Acute Rehab OT Goals Patient Stated Goal: to feel better OT Goal Formulation: With patient Time For Goal Achievement: 12/04/20 Potential to Achieve Goals: Fair ADL Goals Pt Will Perform Grooming: with supervision Pt Will Transfer to Toilet: with supervision Pt Will Perform Toileting - Clothing Manipulation and hygiene: with supervision  OT Frequency: Min 2X/week   Barriers to D/C:    none known at this time          AM-PAC OT "6 Clicks" Daily Activity     Outcome Measure Help from another person eating meals?: None Help from another person taking care of personal grooming?: None Help from another person toileting, which includes using toliet, bedpan, or urinal?: A Little Help from another person bathing (including washing,  rinsing, drying)?: A Little Help from another person to put on and taking off regular upper body clothing?: None Help from another person to put on and taking off regular lower body clothing?: A Little 6 Click Score: 21   End of Session Equipment Utilized During Treatment: Rolling walker;Oxygen Nurse Communication: Mobility status;Other (comment) (O2 increased to 3 Ls)  Activity Tolerance: Patient tolerated treatment well Patient left: in bed;with call bell/phone within reach  OT Visit Diagnosis: Unsteadiness on feet (R26.81);Muscle weakness (generalized) (M62.81)                Time: 0254-2706 OT Time Calculation (min): 32 min Charges:  OT General Charges $OT Visit: 1 Visit OT Evaluation $OT Eval Moderate Complexity: 1 Mod OT Treatments $Self Care/Home Management : 23-37 mins  Darleen Crocker, MS, OTR/L , CBIS ascom [phone]  11/20/20, 12:01 PM

## 2020-11-20 NOTE — Progress Notes (Signed)
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 She is also with large left sided hiatal hernia which occupies her entire left hemithorax. She is with chronic hypoxemia due to both of these etiologies. She also has severe protein calorie malnutrition and is unable to improve due to cachexia. She was seen at [facility] not too long ago and is being optimized for surgical repair of this giant hiatal hernia which is precluding normal enteric feeding and normal respiration. She is now with acute exacerbation of her bronchiectasis.   11/19/20-  no acute events overnight. Spoke with daughter and sister of patient today.  She is s/p cardiac cath today  11/20/20- patient s/p LHC and RHC with findings of non-obstructive CAD and RHC with mild pulmonary hypertension. She is able to speak in full sentences. She feels weak compared to baseline.  She is on 3L/min she is ready for PT/OT and dc planning.   PAST MEDICAL HISTORY   Past Medical History:  Diagnosis Date   Arthritis    Atrial fibrillation ([place])    Bronchiectasis (HCC)    CAD (coronary artery disease) 07/30/2017   CHF (congestive heart failure) (HCC)    Dumping syndrome    Essential hypertension, malignant 10/03/2013   Family history of adverse reaction to anesthesia    sister [name]   GERD (gastroesophageal reflux disease)    Headache    MIGRAINES   Myocardial infarction ([place]) 2007   Non-STEMI   [name] (postoperative nausea and vomiting)    Psoriasis    PUD (peptic ulcer disease)      SURGICAL HISTORY   Past Surgical History:  Procedure  Laterality Date   BACK SURGERY     CHOLECYSTECTOMY     ESOPHAGOGASTRODUODENOSCOPY N/A 09/04/2020   Procedure: ESOPHAGOGASTRODUODENOSCOPY (EGD);  Surgeon: Virgel Manifold, MD;  Location: [facility] ENDOSCOPY;  Service: Endoscopy;  Laterality: N/A;   ESOPHAGOGASTRODUODENOSCOPY (EGD) WITH PROPOFOL N/A 03/19/2019   Procedure: ESOPHAGOGASTRODUODENOSCOPY (EGD) WITH PROPOFOL;  Surgeon: [name], MD;  Location: [facility] ENDOSCOPY;  Service: Gastroenterology;  Laterality: N/A;  *Note to anesthesia: Per pt's pulmonologist, if intubating, please extubate to BIPAP.   EYE SURGERY     FOOT SURGERY     INTRAMEDULLARY (IM) NAIL INTERTROCHANTERIC Right 09/30/2018   Procedure: INTRAMEDULLARY (IM) NAIL INTERTROCHANTRIC;  Surgeon: Dereck Leep, MD;  Location: [facility] ORS;  Service: Orthopedics;  Laterality: Right;   KYPHOPLASTY N/A 07/05/2016   Procedure: KYPHOPLASTY T - 9;  Surgeon: Hessie Knows, MD;  Location: [facility] ORS;  Service: Orthopedics;  Laterality: N/A;   KYPHOPLASTY N/A 11/29/2017   Procedure: Iona Hansen;  Surgeon: Hessie Knows, MD;  Location: [facility] ORS;  Service: Orthopedics;  Laterality: N/A;  L2 and L3   KYPHOPLASTY N/A 12/18/2017   Procedure: KYPHOPLASTY L1;  Surgeon: Hessie Knows, MD;  Location: [facility] ORS;  Service: Orthopedics;  Laterality: N/A;   KYPHOPLASTY N/A 01/05/2018   Procedure: KYPHOPLASTY-T11,T12;  Surgeon: Hessie Knows, MD;  Location: [facility] ORS;  Service:  Orthopedics;  Laterality: N/A;   KYPHOPLASTY N/A 04/05/2018   Procedure: T10 KYPHOPLASTY;  Surgeon: Hessie Knows, MD;  Location: [facility] ORS;  Service: Orthopedics;  Laterality: N/A;   KYPHOPLASTY N/A 04/12/2018   Procedure: KYPHOPLASTY T7,8;  Surgeon: Hessie Knows, MD;  Location: [facility] ORS;  Service: Orthopedics;  Laterality: N/A;   KYPHOPLASTY N/A 04/19/2018   Procedure: KYPHOPLASTY T5, T6;  Surgeon: Hessie Knows, MD;  Location: [facility] ORS;  Service: Orthopedics;  Laterality: N/A;   LUNG SURGERY  1990 and 1996   RIGHT/LEFT HEART CATH AND  CORONARY ANGIOGRAPHY N/A 11/19/2020   Procedure: RIGHT/LEFT HEART CATH AND CORONARY ANGIOGRAPHY;  Surgeon: Corey Skains, MD;  Location: [facility];  Service: Cardiovascular;  Laterality: N/A;   THOROCOTOMY WITH LOBECTOMY     LEFT LOWER THORACOTOMY / RIGHT MIDDLE LOBECTOMY     FAMILY HISTORY   Family History  Problem Relation Age of Onset   Hypertension Mother    Hypertension Father      SOCIAL HISTORY   Social History   Tobacco Use   Smoking status: Never   Smokeless tobacco: Never  Vaping Use   Vaping Use: Never used  Substance Use Topics   Alcohol use: No   Drug use: No     MEDICATIONS    Home Medication:  REM   Current Medication:  Current Facility-Administered Medications:    acetaminophen (TYLENOL) tablet 650 mg, 650 mg, Oral, Q4H PRN, Corey Skains, MD, 650 mg at 11/19/20 2144   acetylcysteine (MUCOMYST) 20 % nebulizer / oral solution 4 mL, 4 mL, Nebulization, Daily, Fritzi Mandes, MD   amiodarone (PACERONE) tablet 200 mg, 200 mg, Oral, BID, Florina Ou V, MD, 200 mg at 11/19/20 2140   apixaban (ELIQUIS) tablet 5 mg, 5 mg, Oral, BID, Corey Skains, MD, 5 mg at 11/19/20 2140   ascorbic acid (VITAMIN C) tablet 1,000 mg, 1,000 mg, Oral, Daily, Florina Ou V, MD, 1,000 mg at 11/18/20 1051   aspirin EC tablet 81 mg, 81 mg, Oral, Daily, Para Skeans, MD, 81 mg at 11/19/20 0944   atorvastatin (LIPITOR) tablet 40 mg, 40 mg, Oral, Daily, Florina Ou V, MD, 40 mg at 11/18/20 1053   benzonatate (TESSALON) capsule 200 mg, 200 mg, Oral, TID PRN, Para Skeans, MD, 200 mg at 11/19/20 2140   Chlorhexidine Gluconate Cloth 2 % PADS 6 each, 6 each, Topical, Daily, Para Skeans, MD   digoxin (LANOXIN) tablet 62.5 mcg, 62.5 mcg, Oral, Daily, Fritzi Mandes, MD, 62.5 mcg at 11/18/20 1054   ferrous sulfate tablet 325 mg, 325 mg, Oral, Daily, Fritzi Mandes, MD   furosemide (LASIX) tablet 40 mg, 40 mg, Oral, Daily, Fritzi Mandes, MD, 40 mg at 11/18/20 1714    gabapentin (NEURONTIN) capsule 300 mg, 300 mg, Oral, QHS PRN, Para Skeans, MD, 300 mg at 11/19/20 2141   insulin aspart (novoLOG) injection 0-9 Units, 0-9 Units, Subcutaneous, Q4H, Deal, Justice Britain, RPH   ipratropium-albuterol (DUONEB) 0.5-2.5 (3) MG/3ML nebulizer solution 6 mL, 6 mL, Nebulization, Q6H PRN, Florina Ou V, MD, 6 mL at 11/19/20 2045   levofloxacin (LEVAQUIN) IVPB 750 mg, 750 mg, Intravenous, Q48H, Fritzi Mandes, MD, Last Rate: 100 mL/hr at 11/19/20 1904, 750 mg at 11/19/20 1904   LORazepam (ATIVAN) tablet 0.5 mg, 0.5 mg, Oral, QHS, Florina Ou V, MD, 0.5 mg at 11/19/20 2141   losartan (COZAAR) tablet 100 mg, 100 mg, Oral, Daily, Florina Ou V, MD, 100 mg at 11/18/20 1053  methylPREDNISolone sodium succinate (SOLU-MEDROL) 125 mg/2 mL injection 80 mg, 80 mg, Intravenous, Q24H, Avrohom Mckelvin, MD   metoprolol succinate (TOPROL-XL) 24 hr tablet 25 mg, 25 mg, Oral, Daily, Florina Ou V, MD, 25 mg at 11/19/20 0943   morphine 2 MG/ML injection 0.5 mg, 0.5 mg, Intravenous, Q6H PRN, Para Skeans, MD   multivitamin with minerals tablet 1 tablet, 1 tablet, Oral, Daily, Fritzi Mandes, MD   nitroGLYCERIN (NITROSTAT) SL tablet 0.4 mg, 0.4 mg, Sublingual, Q5 Min x 3 PRN, Para Skeans, MD   ondansetron (ZOFRAN) injection 4 mg, 4 mg, Intravenous, Q6H PRN, Corey Skains, MD   pantoprazole (PROTONIX) EC tablet 40 mg, 40 mg, Oral, BID AC, Fritzi Mandes, MD, 40 mg at 11/19/20 2144   senna (SENOKOT) tablet 17.2 mg, 2 tablet, Oral, QHS, Fritzi Mandes, MD, 17.2 mg at 11/19/20 2140   sodium chloride flush (NS) 0.9 % injection 10-40 mL, 10-40 mL, Intracatheter, Q12H, Florina Ou V, MD, 10 mL at 11/19/20 2145   sodium chloride flush (NS) 0.9 % injection 10-40 mL, 10-40 mL, Intracatheter, PRN, Para Skeans, MD   sodium chloride flush (NS) 0.9 % injection 3 mL, 3 mL, Intravenous, Q12H, Corey Skains, MD, 3 mL at 11/19/20 2145   sucralfate (CARAFATE) 1 GM/10ML suspension 1 g, 1 g, Oral, Q6H, Fritzi Mandes, MD,  1 g at 11/20/20 0348   TPN ADULT (ION), , Intravenous, Continuous TPN, Deal, Justice Britain, RPH, Last Rate: 55 mL/hr at 11/19/20 1844, Infusion Verify at 11/19/20 1844    ALLERGIES   Codeine, Sulfa antibiotics, and Penicillins     REVIEW OF SYSTEMS    Review of Systems:  Gen:  Denies  fever, sweats, chills weigh loss  HEENT: Denies blurred vision, double vision, ear pain, eye pain, hearing loss, nose bleeds, sore throat Cardiac:  No dizziness, chest pain or heaviness, chest tightness,edema Resp:   reports severe dyspnea cough which is productive of thick yellow/brown phlegm Gi: admits to NVD Gu:  Denies bladder incontinence, burning urine Ext:   Denies Joint pain, stiffness or swelling Skin: Denies  skin rash, easy bruising or bleeding or hives Endoc:  Denies polyuria, polydipsia , polyphagia or weight change Psych:   Denies depression, insomnia or hallucinations   Other:  All other systems negative   VS: BP (!) 147/77 (BP Location: Left Arm)   Pulse 72   Temp (!) 97.4 F (36.3 C)   Resp 16   Ht _0  (1.676 m)   Wt 50.2 kg   SpO2 99%   BMI 17.85 kg/m      PHYSICAL EXAM    GENERAL:NAD, no fevers, chills, no weakness no fatigue HEAD: Normocephalic, atraumatic.  EYES: Pupils equal, round, reactive to light. Extraocular muscles intact. No scleral icterus.  MOUTH: Moist mucosal membrane. Dentition intact. No abscess noted.  EAR, NOSE, THROAT: Clear without exudates. No external lesions.  NECK: Supple. No thyromegaly. No nodules. No JVD.  PULMONARY: Diffuse coarse rhonchi bilaterally CARDIOVASCULAR: S1 and S2. Regular rate and rhythm. No murmurs, rubs, or gallops. No edema. Pedal pulses 2+ bilaterally.  GASTROINTESTINAL: Soft, nontender, nondistended. No masses. Positive bowel sounds. No hepatosplenomegaly.  MUSCULOSKELETAL: No swelling, clubbing, or edema. Range of motion full in all extremities.  NEUROLOGIC: Cranial nerves II through XII are intact. No gross focal  neurological deficits. Sensation intact. Reflexes intact.  SKIN: No ulceration, lesions, rashes, or cyanosis. Skin warm and dry. Turgor intact.  PSYCHIATRIC: Mood, affect within normal limits. The patient is awake,  alert and oriented x 3. Insight, judgment intact.       IMAGING    DG Chest 2 View  Result Date: 11/17/2020 CLINICAL DATA:  Shortness of breath.  Chest pain. EXAM: CHEST - 2 VIEW COMPARISON:  09/13/2020 FINDINGS: Heart size remains mildly enlarged. Chronic atherosclerosis of the aorta. Chronic emphysema and pulmonary scarring with pulmonary resection in the right upper lung and presumed lobectomy on the left. Chronic elevation of the left hemidiaphragm. No sign of acute infiltrate. Multiple previous vertebral augmentations. Right arm PICC tip at the SVC RA junction. IMPRESSION: Previous pulmonary resection is a. chronic elevation of the left hemidiaphragm. Emphysema and pulmonary scarring as seen previously. No active process identified. PICC tip at the SVC RA junction. Electronically Signed   By: Nelson Chimes M.D.   On: 11/17/2020 14:45   CT Angio Chest PE W and/or Wo Contrast  Result Date: 11/17/2020 CLINICAL DATA:  chronic bronchiectasis, on antibiotics, worsening respiratory symptoms, pleuritic chest pains. eval infiltrate, effusion, PE EXAM: CT ANGIOGRAPHY CHEST WITH CONTRAST TECHNIQUE: Multidetector CT imaging of the chest was performed using the standard protocol during bolus administration of intravenous contrast. Multiplanar CT image reconstructions and MIPs were obtained to evaluate the vascular anatomy. CONTRAST:  87m OMNIPAQUE IOHEXOL 350 MG/ML SOLN COMPARISON:  09/14/2020 FINDINGS: Cardiovascular: No filling defects in the pulmonary arteries to suggest pulmonary emboli. Cardiomegaly. Multi-vessel coronary artery calcifications. Tortuous calcified aorta. No aneurysm. Mediastinum/Nodes: No mediastinal, hilar, or axillary adenopathy. Large hiatal hernia again noted, unchanged.  Lungs/Pleura: Postoperative changes bilaterally. Bronchiectasis again seen bilaterally, right greater than left. Areas of scarring in the lungs. No pleural effusion or acute areas of consolidation. Upper Abdomen: Imaging into the upper abdomen demonstrates no acute findings. Musculoskeletal: Multilevel vertebroplasty changes throughout the thoracic and upper lumbar spine, unchanged. Review of the MIP images confirms the above findings. IMPRESSION: No evidence of pulmonary embolus. Cardiomegaly, coronary artery disease. Stable chronic changes in the lungs with bronchiectasis and areas of scarring. Large hiatal hernia. Aortic Atherosclerosis (ICD10-I70.0). Electronically Signed   By: KRolm BaptiseM.D.   On: 11/17/2020 21:47   CARDIAC CATHETERIZATION  Result Date: 11/19/2020   Mid LAD lesion is 40% stenosed.   The left ventricular systolic function is normal.   LV end diastolic pressure is moderately elevated.   The left ventricular ejection fraction is 55-65% by visual estimate.   Hemodynamic findings consistent with mild pulmonary hypertension.   There is mild (2+) mitral regurgitation. 75year old female with paroxysmal nonvalvular atrial fibrillation previous known coronary artery atherosclerosis and hypertension hyperlipidemia with acute exacerbation of pulmonary concerns and hypoxia with elevated troponin Left ventricle with normal LV systolic function ejection fraction of 60% Mild pulmonary hypertension with mean pulmonary pressures of 29 mm Minimal left anterior descending artery atherosclerosis Plan Continue medical management and treatment of exacerbation of lung disease High intensity cholesterol therapy for minimal coronary atherosclerosis Continuation of amiodarone and or beta-blocker for hypertension control and maintenance of normal sinus rhythm Reinstatement of anticoagulation for further risk reduction and stroke with atrial fibrillation Begin rehabilitation and no further cardiac intervention at  this time   ECHOCARDIOGRAM COMPLETE  Result Date: 11/18/2020    ECHOCARDIOGRAM REPORT   Patient Name:   EAmmie FerrierDate of Exam: 11/18/2020 Medical Rec #:  [id number]      Height:       66.0 in Accession #:    [id number]     Weight:       99.0 lb Date of Birth:  25-Aug-1945       BSA:          1.483 m Patient Age:    5 years        BP:           132/68 mmHg Patient Gender: F               HR:           70 bpm. Exam Location:  [facility] Procedure: 2D Echo, Cardiac Doppler and Color Doppler Indications:     CHF-acute diastolic R60.45  History:         Patient has prior history of Echocardiogram examinations, most                  recent 04/30/2020. Previous Myocardial Infarction,                  Arrythmias:Atrial Fibrillation; Risk Factors:Hypertension.  Sonographer:     Sherrie Sport Referring Phys:  [place] Diagnosing Phys: Serafina Royals MD  Sonographer Comments: Suboptimal apical window. IMPRESSIONS  1. Left ventricular ejection fraction, by estimation, is 50 to 55%. The left ventricle has low normal function. The left ventricle has no regional wall motion abnormalities. Left ventricular diastolic parameters were normal.  2. Right ventricular systolic function is normal. The right ventricular size is normal.  3. The mitral valve is normal in structure. Mild mitral valve regurgitation.  4. The aortic valve is normal in structure. Aortic valve regurgitation is mild. FINDINGS  Left Ventricle: Left ventricular ejection fraction, by estimation, is 50 to 55%. The left ventricle has low normal function. The left ventricle has no regional wall motion abnormalities. The left ventricular internal cavity size was normal in size. There is no left ventricular hypertrophy. Left ventricular diastolic parameters were normal. Right Ventricle: The right ventricular size is normal. No increase in right ventricular wall thickness. Right ventricular systolic function is normal. Left Atrium: Left atrial size was normal  in size. Right Atrium: Right atrial size was normal in size. Pericardium: There is no evidence of pericardial effusion. Mitral Valve: The mitral valve is normal in structure. Mild mitral valve regurgitation. Tricuspid Valve: The tricuspid valve is normal in structure. Tricuspid valve regurgitation is mild. Aortic Valve: The aortic valve is normal in structure. Aortic valve regurgitation is mild. Aortic valve mean gradient measures 4.0 mmHg. Aortic valve peak gradient measures 7.3 mmHg. Aortic valve area, by VTI measures 2.33 cm. Pulmonic Valve: The pulmonic valve was normal in structure. Pulmonic valve regurgitation is trivial. Aorta: The aortic root and ascending aorta are structurally normal, with no evidence of dilitation. IAS/Shunts: No atrial level shunt detected by color flow Doppler.  LEFT VENTRICLE PLAX 2D LVIDd:         3.55 cm   Diastology LVIDs:         2.60 cm   LV e' medial:    3.70 cm/s LV PW:         0.86 cm   LV E/e' medial:  16.7 LV IVS:        0.99 cm   LV e' lateral:   4.90 cm/s LVOT diam:     2.00 cm   LV E/e' lateral: 12.6 LV SV:         57 LV SV Index:   39 LVOT Area:     3.14 cm  RIGHT VENTRICLE RV Basal diam:  3.17 cm RV S prime:     14.10 cm/s TAPSE (M-mode): 3.8  cm LEFT ATRIUM             Index        RIGHT ATRIUM           Index LA diam:        2.30 cm 1.55 cm/m   RA Area:     20.50 cm LA Vol (A2C):   70.9 ml 47.80 ml/m  RA Volume:   62.40 ml  42.07 ml/m LA Vol (A4C):   54.3 ml 36.61 ml/m LA Biplane Vol: 61.8 ml 41.67 ml/m  AORTIC VALVE                    PULMONIC VALVE AV Area (Vmax):    2.11 cm     PV Vmax:        0.80 m/s AV Area (Vmean):   2.06 cm     PV Peak grad:   2.6 mmHg AV Area (VTI):     2.33 cm     RVOT Peak grad: 3 mmHg AV Vmax:           135.00 cm/s AV Vmean:          90.100 cm/s AV VTI:            0.247 m AV Peak Grad:      7.3 mmHg AV Mean Grad:      4.0 mmHg LVOT Vmax:         90.70 cm/s LVOT Vmean:        59.000 cm/s LVOT VTI:          0.183 m LVOT/AV VTI  ratio: 0.74  AORTA Ao Root diam: 3.00 cm MITRAL VALVE                TRICUSPID VALVE MV Area (PHT): 6.83 cm     TR Peak grad:   12.7 mmHg MV Decel Time: 111 msec     TR Vmax:        178.00 cm/s MV E velocity: 61.70 cm/s MV A velocity: 100.00 cm/s  SHUNTS MV E/A ratio:  0.62         Systemic VTI:  0.18 m                             Systemic Diam: 2.00 cm Serafina Royals MD Electronically signed by Serafina Royals MD Signature Date/Time: 11/18/2020/1:30:01 PM    Final       ASSESSMENT/PLAN   Acute on chronic hypoxemic respiratory failure due to Acute exacerbation of COPD  -IV levofloxacin -solumedrol weaning to 40 iv daily  -monitor renal dosing due to ckd - BPH with IS at bedside -flutter valve -PT/OT -telemetry monitoring -MRSA nasal pCR -procalcintonin   Chest pain with acute coronary syndrome  - appreciate cardiology collboration - for LHC today    Severe Left lung atelectasis    - plan for hiatal hernia repair soon after medical optimization of nutritional impairment  Severe protein calorie malnutrition - patient is on TPN via PICC     Thank you for allowing me to participate in the care of this patient.   Patient/Family are satisfied with care plan and all questions have been answered.  This document was prepared using Dragon voice recognition software and may include unintentional dictation errors.     Ottie Glazier, M.D.  Division of [place]

## 2020-11-20 NOTE — Progress Notes (Signed)
[facility] Encounter Note  Patient: Ana Washington / Admit Date: 11/17/2020 / Date of Encounter: 11/20/2020, 8:48 AM   Subjective: Patient is overall done well since her admission with no evidence of significant chest discomfort.  Patient has had some improvements in shortness of breath.  Troponin peak at 703 more consistent with demand ischemia rather than acute coronary syndrome  Echocardiogram showing normal LV systolic function with ejection fraction of 50% with no evidence of significant valvular heart disease  Cardiac catheterization showing minimal atherosclerosis of mid LAD and no evidence of critical stenosis.  Right heart cath suggest mild pulmonary hypertension with mean pulmonary artery pressures of 29 mm  Patient has done well overnight. No complaints of further chest pain. Being followed by pulmonology for remaining shortness of breath  Review of Systems: Positive for: Shortness of breath Negative for: Vision change, hearing change, syncope, dizziness, nausea, vomiting,diarrhea, bloody stool, stomach pain, cough, congestion, diaphoresis, urinary frequency, urinary pain,skin lesions, skin rashes Others previously listed  Objective: Telemetry: Normal sinus rhythm Physical Exam: Blood pressure (!) 147/77, pulse 72, temperature (!) 97.4 F (36.3 C), resp. rate 16, height 5\' 6"  (1.676 m), weight 50.2 kg, SpO2 99 %. Body mass index is 17.85 kg/m. General: Well developed, well nourished, in no acute distress. Head: Normocephalic, atraumatic, sclera non-icteric, no xanthomas, nares are without discharge. Neck: No apparent masses Lungs: Normal respirations with some wheezes, no rhonchi, no rales , no crackles   Heart: Regular rate and rhythm, normal S1 S2, no murmur, no rub, no gallop, PMI is normal size and placement, carotid upstroke normal without bruit, jugular venous pressure normal Abdomen: Soft, non-tender, non-distended with normoactive bowel  sounds. No hepatosplenomegaly. Abdominal aorta is normal size without bruit Extremities: No edema, no clubbing, no cyanosis, no ulcers,  Peripheral: 2+ radial, 2+ femoral, 2+ dorsal pedal pulses Neuro: Alert and oriented. Moves all extremities spontaneously. Psych:  Responds to questions appropriately with a normal affect.   Intake/Output Summary (Last 24 hours) at 11/20/2020 0848 Last data filed at 11/20/2020 0348 Gross per 24 hour  Intake 312.64 ml  Output 400 ml  Net -87.36 ml     Inpatient Medications:   acetylcysteine  4 mL Nebulization Daily   amiodarone  200 mg Oral BID   apixaban  5 mg Oral BID   vitamin C  1,000 mg Oral Daily   aspirin EC  81 mg Oral Daily   atorvastatin  40 mg Oral Daily   Chlorhexidine Gluconate Cloth  6 each Topical Daily   digoxin  62.5 mcg Oral Daily   ferrous sulfate  325 mg Oral Daily   furosemide  40 mg Oral Daily   insulin aspart  0-9 Units Subcutaneous Q4H   LORazepam  0.5 mg Oral QHS   losartan  100 mg Oral Daily   methylPREDNISolone (SOLU-MEDROL) injection  80 mg Intravenous Q24H   metoprolol succinate  25 mg Oral Daily   multivitamin with minerals  1 tablet Oral Daily   pantoprazole  40 mg Oral BID AC   polyethylene glycol  17 g Oral Daily   senna  2 tablet Oral QHS   sodium chloride flush  10-40 mL Intracatheter Q12H   sodium chloride flush  3 mL Intravenous Q12H   sucralfate  1 g Oral Q6H   Infusions:   levofloxacin (LEVAQUIN) IV 750 mg (11/19/20 1904)   TPN ADULT (ION) 55 mL/hr at 11/19/20 1844   TPN ADULT (ION)      Labs:  Recent Labs    11/19/20 0611 11/20/20 0351  NA 139 136  K 3.9 3.6  CL 94* 93*  CO2 37* 36*  GLUCOSE 117* 126*  BUN 46* 40*  CREATININE 0.98 0.73  CALCIUM 9.0 8.6*  MG 2.0 1.8  PHOS 5.0* 3.4    Recent Labs    11/19/20 0611 11/20/20 0351  AST 29 31  ALT 18 26  ALKPHOS 104 88  BILITOT 0.2* 0.4  PROT 5.3* 4.8*  ALBUMIN 2.7* 2.1*    Recent Labs    11/17/20 1350 11/19/20 0611  WBC  20.6* 22.3*  HGB 11.6* 10.5*  HCT 36.2 32.8*  MCV 102.3* 102.8*  PLT 274 271    No results for input(s): CKTOTAL, CKMB, TROPONINI in the last 72 hours. Invalid input(s): POCBNP No results for input(s): HGBA1C in the last 72 hours.   Weights: Filed Weights   11/17/20 1347 11/20/20 0345  Weight: 44.9 kg 50.2 kg     Radiology/Studies:  DG Chest 2 View  Result Date: 11/17/2020 CLINICAL DATA:  Shortness of breath.  Chest pain. EXAM: CHEST - 2 VIEW COMPARISON:  09/13/2020 FINDINGS: Heart size remains mildly enlarged. Chronic atherosclerosis of the aorta. Chronic emphysema and pulmonary scarring with pulmonary resection in the right upper lung and presumed lobectomy on the left. Chronic elevation of the left hemidiaphragm. No sign of acute infiltrate. Multiple previous vertebral augmentations. Right arm PICC tip at the SVC RA junction. IMPRESSION: Previous pulmonary resection is a. chronic elevation of the left hemidiaphragm. Emphysema and pulmonary scarring as seen previously. No active process identified. PICC tip at the SVC RA junction. Electronically Signed   By: Nelson Chimes M.D.   On: 11/17/2020 14:45   CT Angio Chest PE W and/or Wo Contrast  Result Date: 11/17/2020 CLINICAL DATA:  chronic bronchiectasis, on antibiotics, worsening respiratory symptoms, pleuritic chest pains. eval infiltrate, effusion, PE EXAM: CT ANGIOGRAPHY CHEST WITH CONTRAST TECHNIQUE: Multidetector CT imaging of the chest was performed using the standard protocol during bolus administration of intravenous contrast. Multiplanar CT image reconstructions and MIPs were obtained to evaluate the vascular anatomy. CONTRAST:  73mL OMNIPAQUE IOHEXOL 350 MG/ML SOLN COMPARISON:  09/14/2020 FINDINGS: Cardiovascular: No filling defects in the pulmonary arteries to suggest pulmonary emboli. Cardiomegaly. Multi-vessel coronary artery calcifications. Tortuous calcified aorta. No aneurysm. Mediastinum/Nodes: No mediastinal, hilar, or  axillary adenopathy. Large hiatal hernia again noted, unchanged. Lungs/Pleura: Postoperative changes bilaterally. Bronchiectasis again seen bilaterally, right greater than left. Areas of scarring in the lungs. No pleural effusion or acute areas of consolidation. Upper Abdomen: Imaging into the upper abdomen demonstrates no acute findings. Musculoskeletal: Multilevel vertebroplasty changes throughout the thoracic and upper lumbar spine, unchanged. Review of the MIP images confirms the above findings. IMPRESSION: No evidence of pulmonary embolus. Cardiomegaly, coronary artery disease. Stable chronic changes in the lungs with bronchiectasis and areas of scarring. Large hiatal hernia. Aortic Atherosclerosis (ICD10-I70.0). Electronically Signed   By: Rolm Baptise M.D.   On: 11/17/2020 21:47   CARDIAC CATHETERIZATION  Result Date: 11/19/2020   Mid LAD lesion is 40% stenosed.   The left ventricular systolic function is normal.   LV end diastolic pressure is moderately elevated.   The left ventricular ejection fraction is 55-65% by visual estimate.   Hemodynamic findings consistent with mild pulmonary hypertension.   There is mild (2+) mitral regurgitation. 75 year old female with paroxysmal nonvalvular atrial fibrillation previous known coronary artery atherosclerosis and hypertension hyperlipidemia with acute exacerbation of pulmonary concerns and hypoxia with elevated troponin Left ventricle  with normal LV systolic function ejection fraction of 60% Mild pulmonary hypertension with mean pulmonary pressures of 29 mm Minimal left anterior descending artery atherosclerosis Plan Continue medical management and treatment of exacerbation of lung disease High intensity cholesterol therapy for minimal coronary atherosclerosis Continuation of amiodarone and or beta-blocker for hypertension control and maintenance of normal sinus rhythm Reinstatement of anticoagulation for further risk reduction and stroke with atrial  fibrillation Begin rehabilitation and no further cardiac intervention at this time   ECHOCARDIOGRAM COMPLETE  Result Date: 11/18/2020    ECHOCARDIOGRAM REPORT   Patient Name:   Ana Washington Date of Exam: 11/18/2020 Medical Rec #:  [id number]       Height:       66.0 in Accession #:    [id number]      Weight:       99.0 lb Date of Birth:  03/18/1945       BSA:          1.483 m Patient Age:    63 years        BP:           132/68 mmHg Patient Gender: F               HR:           70 bpm. Exam Location:  [facility] Procedure: 2D Echo, Cardiac Doppler and Color Doppler Indications:     CHF-acute diastolic S17.79  History:         Patient has prior history of Echocardiogram examinations, most                  recent 04/30/2020. Previous Myocardial Infarction,                  Arrythmias:Atrial Fibrillation; Risk Factors:Hypertension.  Sonographer:     Sherrie Sport Referring Phys:  [place] Diagnosing Phys: Serafina Royals MD  Sonographer Comments: Suboptimal apical window. IMPRESSIONS  1. Left ventricular ejection fraction, by estimation, is 50 to 55%. The left ventricle has low normal function. The left ventricle has no regional wall motion abnormalities. Left ventricular diastolic parameters were normal.  2. Right ventricular systolic function is normal. The right ventricular size is normal.  3. The mitral valve is normal in structure. Mild mitral valve regurgitation.  4. The aortic valve is normal in structure. Aortic valve regurgitation is mild. FINDINGS  Left Ventricle: Left ventricular ejection fraction, by estimation, is 50 to 55%. The left ventricle has low normal function. The left ventricle has no regional wall motion abnormalities. The left ventricular internal cavity size was normal in size. There is no left ventricular hypertrophy. Left ventricular diastolic parameters were normal. Right Ventricle: The right ventricular size is normal. No increase in right ventricular wall thickness. Right  ventricular systolic function is normal. Left Atrium: Left atrial size was normal in size. Right Atrium: Right atrial size was normal in size. Pericardium: There is no evidence of pericardial effusion. Mitral Valve: The mitral valve is normal in structure. Mild mitral valve regurgitation. Tricuspid Valve: The tricuspid valve is normal in structure. Tricuspid valve regurgitation is mild. Aortic Valve: The aortic valve is normal in structure. Aortic valve regurgitation is mild. Aortic valve mean gradient measures 4.0 mmHg. Aortic valve peak gradient measures 7.3 mmHg. Aortic valve area, by VTI measures 2.33 cm. Pulmonic Valve: The pulmonic valve was normal in structure. Pulmonic valve regurgitation is trivial. Aorta: The aortic root and ascending aorta are structurally normal, with no  evidence of dilitation. IAS/Shunts: No atrial level shunt detected by color flow Doppler.  LEFT VENTRICLE PLAX 2D LVIDd:         3.55 cm   Diastology LVIDs:         2.60 cm   LV e' medial:    3.70 cm/s LV PW:         0.86 cm   LV E/e' medial:  16.7 LV IVS:        0.99 cm   LV e' lateral:   4.90 cm/s LVOT diam:     2.00 cm   LV E/e' lateral: 12.6 LV SV:         57 LV SV Index:   39 LVOT Area:     3.14 cm  RIGHT VENTRICLE RV Basal diam:  3.17 cm RV S prime:     14.10 cm/s TAPSE (M-mode): 3.8 cm LEFT ATRIUM             Index        RIGHT ATRIUM           Index LA diam:        2.30 cm 1.55 cm/m   RA Area:     20.50 cm LA Vol (A2C):   70.9 ml 47.80 ml/m  RA Volume:   62.40 ml  42.07 ml/m LA Vol (A4C):   54.3 ml 36.61 ml/m LA Biplane Vol: 61.8 ml 41.67 ml/m  AORTIC VALVE                    PULMONIC VALVE AV Area (Vmax):    2.11 cm     PV Vmax:        0.80 m/s AV Area (Vmean):   2.06 cm     PV Peak grad:   2.6 mmHg AV Area (VTI):     2.33 cm     RVOT Peak grad: 3 mmHg AV Vmax:           135.00 cm/s AV Vmean:          90.100 cm/s AV VTI:            0.247 m AV Peak Grad:      7.3 mmHg AV Mean Grad:      4.0 mmHg LVOT Vmax:          90.70 cm/s LVOT Vmean:        59.000 cm/s LVOT VTI:          0.183 m LVOT/AV VTI ratio: 0.74  AORTA Ao Root diam: 3.00 cm MITRAL VALVE                TRICUSPID VALVE MV Area (PHT): 6.83 cm     TR Peak grad:   12.7 mmHg MV Decel Time: 111 msec     TR Vmax:        178.00 cm/s MV E velocity: 61.70 cm/s MV A velocity: 100.00 cm/s  SHUNTS MV E/A ratio:  0.62         Systemic VTI:  0.18 m                             Systemic Diam: 2.00 cm Serafina Royals MD Electronically signed by Serafina Royals MD Signature Date/Time: 11/18/2020/1:30:01 PM    Final      Assessment and Recommendation  75 y.o. female with known coronary artery atherosclerosis and paroxysmal nonvalvular atrial fibrillation with significant lung disease and  acute on chronic exacerbation of lung disease with hypoxia and elevated troponin consistent with demand ischemia without evidence of congestive heart failure  1.  No further cardiac intervention at this time due to no evidence of significant coronary artery disease with normal LV systolic function by echocardiogram and cardiac catheterization 2.  Reinstatement of Eliquis for risk reduction and stroke with atrial fibrillation 3.  No change in outpatient medication management for atrial fibrillation including amiodarone for maintenance of normal sinus rhythm 4.  Metoprolol for heart rate and blood pressure control without change 5.  High intensity cholesterol therapy as before for coronary artery atherosclerosis 6.  Begin ambulation and follow-up for improvements of symptoms and okay for discharge home from cardiac standpoint with follow-up next week 7.  Please call if further questions  Signed, Jettie Booze, PA-C

## 2020-11-20 NOTE — Progress Notes (Signed)
[place] [facility] [place] NAME: Ana Washington    MR#:  [id number]  DATE OF BIRTH:  07-13-1945  SUBJECTIVE:  patient came in secondary to increasing cough with productive phlegm. She was seen outpatient in the pulmonary office and started on oral antibiotic and prednisone. Patient has chronic bronchiectasis continues to cough or green/yellowish phlegm.  currently is also getting IV TPN which was started at [facility] about 7 to 8 weeks ago. She has a PICC line and right upper extremity  Pt feels her nose is congested. Answered all her questions about abxs REVIEW OF SYSTEMS:   Review of Systems  Constitutional:  Negative for chills, fever and weight loss.  HENT:  Negative for ear discharge, ear pain and nosebleeds.   Eyes:  Negative for blurred vision, pain and discharge.  Respiratory:  Positive for cough, sputum production and shortness of breath. Negative for wheezing and stridor.   Cardiovascular:  Negative for chest pain, palpitations, orthopnea and PND.  Gastrointestinal:  Negative for abdominal pain, diarrhea, nausea and vomiting.  Genitourinary:  Negative for frequency and urgency.  Musculoskeletal:  Negative for back pain and joint pain.  Neurological:  Positive for weakness. Negative for sensory change, speech change and focal weakness.  Psychiatric/Behavioral:  Negative for depression and hallucinations. The patient is not nervous/anxious.   Tolerating Diet:yes Tolerating PT: ambulatory  DRUG ALLERGIES:   Allergies  Allergen Reactions   Codeine Nausea And Vomiting   Sulfa Antibiotics Diarrhea and Anxiety    "Makes her feel shaky."   Penicillins Rash    Has patient had a PCN reaction causing immediate rash, facial/tongue/throat swelling, SOB or lightheadedness with hypotension: Unknown Has patient had a PCN reaction causing severe rash involving mucus membranes or skin necrosis: Unknown Has patient had a PCN reaction that required  hospitalization: Unknown Has patient had a PCN reaction occurring within the last 10 years: No If all of the above answers are "NO", then may proceed with Cephalosporin use.     VITALS:  Blood pressure 139/68, pulse 76, temperature 97.8 F (36.6 C), resp. rate 20, height _0  (1.676 m), weight 50.2 kg, SpO2 99 %.  PHYSICAL EXAMINATION:   Physical Exam  GENERAL:  75 y.o.-year-old patient lying in the bed with no acute distress.   LUNGS: Normal breath sounds bilaterally, no wheezing, rales, rhonchi. No use of accessory muscles of respiration.  CARDIOVASCULAR: S1, S2 normal. No murmurs, rubs, or gallops.  ABDOMEN: Soft, nontender, nondistended. Bowel sounds present. No organomegaly or mass.  EXTREMITIES: No cyanosis, clubbing or edema b/l.   Right UE PICC + NEUROLOGIC: Cranial nerves II through XII are intact. No focal Motor or sensory deficits b/l.   PSYCHIATRIC:  patient is alert and oriented x 3.  SKIN: No obvious rash, lesion, or ulcer.   LABORATORY PANEL:  CBC Recent Labs  Lab 11/19/20 0611  WBC 22.3*  HGB 10.5*  HCT 32.8*  PLT 271     Chemistries  Recent Labs  Lab 11/20/20 0351  NA 136  K 3.6  CL 93*  CO2 36*  GLUCOSE 126*  BUN 40*  CREATININE 0.73  CALCIUM 8.6*  MG 1.8  AST 31  ALT 26  ALKPHOS 88  BILITOT 0.4    Cardiac Enzymes No results for input(s): TROPONINI in the last 168 hours. RADIOLOGY:  CARDIAC CATHETERIZATION  Result Date: 11/19/2020   Mid LAD lesion is 40% stenosed.   The left ventricular systolic function is normal.  LV end diastolic pressure is moderately elevated.   The left ventricular ejection fraction is 55-65% by visual estimate.   Hemodynamic findings consistent with mild pulmonary hypertension.   There is mild (2+) mitral regurgitation. 75 year old female with paroxysmal nonvalvular atrial fibrillation previous known coronary artery atherosclerosis and hypertension hyperlipidemia with acute exacerbation of pulmonary concerns and  hypoxia with elevated troponin Left ventricle with normal LV systolic function ejection fraction of 60% Mild pulmonary hypertension with mean pulmonary pressures of 29 mm Minimal left anterior descending artery atherosclerosis Plan Continue medical management and treatment of exacerbation of lung disease High intensity cholesterol therapy for minimal coronary atherosclerosis Continuation of amiodarone and or beta-blocker for hypertension control and maintenance of normal sinus rhythm Reinstatement of anticoagulation for further risk reduction and stroke with atrial fibrillation Begin rehabilitation and no further cardiac intervention at this time   ASSESSMENT AND PLAN:  Ana Washington is a 75 y.o. 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  Chest pain with history of CAD/?NSTEMI -- on IV heparin drip -- cardiology consultation with Dr. Nehemiah Massed-- informed via secure chat -- hold eliquis -- continue losartan, beta-blockers, statins --10/20-- patient underwent cardiac cath   Mid LAD lesion is 40% stenosed.   The left ventricular systolic function is normal.   LV end diastolic pressure is moderately elevated.   The left ventricular ejection fraction is 55-65% by visual estimate.   Hemodynamic findings consistent with mild pulmonary hypertension.   There is mild (2+) mitral regurgitation. Medical management recommended -- heparin drip discontinued  large hiatal hernia-- symptomatic history of TPN -- patient is scheduled to have surgery on November 1 at [facility] -- she currently  is getting IV TPN through PICC line by [facility] for last 7 to 8 weeks to improve her nourishment prior to major surgery -- dietitian and pharmacy consultation for TPN  Gerd/reflux esophagitis -- continue PPI and Carafate  chronic atrial fibrillation on chronic anticoagulation -- continue metoprolol -- hold eliquis since patient was on IV heparin drip --10/20-- resume eliquis today--d/w pharmacy   Procedures: cath Family communication : none Consults : pulmonary CODE STATUS: DNR prior to admission DVT Prophylaxis : heparin drip Level of care: Progressive Cardiac Status is: Inpatient  Remains inpatient appropriate because: IV antibiotics for bronchiectasis PT to see today       TOTAL TIME TAKING CARE OF THIS PATIENT: 25 minutes.  >50% time spent on counselling and coordination of care  Note: This dictation was prepared with Dragon dictation along with smaller phrase technology. Any transcriptional errors that result from this process are unintentional.  Fritzi Mandes M.D    [facility]   CC: Primary care physician; [name], MD Patient ID: Ana Washington, female   DOB: [birth year], 75 y.o.   MRN: [id number]

## 2020-11-20 NOTE — Consult Note (Signed)
PHARMACY - TOTAL PARENTERAL NUTRITION CONSULT NOTE   Indication:  Improve nourishment prior to major surgery  Patient Measurements: Height: 5\' 6"  (167.6 cm) Weight: 50.2 kg (110 lb 9.6 oz) IBW/kg (Calculated) : 59.3 TPN AdjBW (KG): 44.9 Body mass index is 17.85 kg/m. Usual Weight: 44.9kg  Assessment:  75yo F with PMH of chronic bronchiectasis on 2 L home oxygen, atrial fibrillation on eliquis, CAD, CHF, large hiatal hernia who presented to hospital for chest pain and SOB. Pt is scheduled for surgery on Nov 1st at [facility] for large hiatal hernia, pt is receiving TPN to improve nourishment prior to surgery. Pharmacy was consulted for TPN management.    Glucose / Insulin: SSI ordered to start following TPN start Electrolytes: Phos noted to be elevated to 5.0, reduced from 15>69mmol/L in TPN  Renal: Scr<1.0 Hepatic: LFT within normal limits  Intake / Output; MIVF: -725mL since admin, no MIVF  Central access: right PICC TPN start date: Pt was on TPA prior to admission, but will start 10/20 1800  Nutritional Goals: Goal TPN rate is 55 mL/hr (provides 79.5 g of protein (Clinisol 15%) and 1610.4 kcals per day)  RD Assessment: Estimated Needs Total Energy Estimated Needs: 1500-1700kcal/day Total Protein Estimated Needs: 75-85g/day Total Fluid Estimated Needs: 1.2-1.4L/day  Current Nutrition:  NPO  Plan:  Start TPN at 1mL/hr at 1800 Electrolytes in TPN: Na 70mEq/L, K 22mEq/L, Ca 75mEq/L, Mg 54mEq/L, and continue Phos from 35mmol/L. Cl:Ac 1:1 Add standard MVI and trace elements to TPN Initiate Sensitive q4h SSI and adjust as needed  Monitor CMP, Mg, Phos daily for 3 days then CMP, Mg, Phos on Mon/Thurs, with triglycerides weekly    Krisalyn Yankowski Rodriguez-Guzman PharmD, BCPS 11/20/2020 8:34 AM

## 2020-11-21 DIAGNOSIS — E43 Unspecified severe protein-calorie malnutrition: Secondary | ICD-10-CM

## 2020-11-21 DIAGNOSIS — J9621 Acute and chronic respiratory failure with hypoxia: Secondary | ICD-10-CM

## 2020-11-21 LAB — GLUCOSE, CAPILLARY
Glucose-Capillary: 118 mg/dL — ABNORMAL HIGH (ref 70–99)
Glucose-Capillary: 135 mg/dL — ABNORMAL HIGH (ref 70–99)
Glucose-Capillary: 159 mg/dL — ABNORMAL HIGH (ref 70–99)
Glucose-Capillary: 170 mg/dL — ABNORMAL HIGH (ref 70–99)

## 2020-11-21 LAB — COMPREHENSIVE METABOLIC PANEL
ALT: 39 U/L (ref 0–44)
AST: 32 U/L (ref 15–41)
Albumin: 2.3 g/dL — ABNORMAL LOW (ref 3.5–5.0)
Alkaline Phosphatase: 82 U/L (ref 38–126)
Anion gap: 7 (ref 5–15)
BUN: 31 mg/dL — ABNORMAL HIGH (ref 8–23)
CO2: 37 mmol/L — ABNORMAL HIGH (ref 22–32)
Calcium: 8.8 mg/dL — ABNORMAL LOW (ref 8.9–10.3)
Chloride: 92 mmol/L — ABNORMAL LOW (ref 98–111)
Creatinine, Ser: 0.55 mg/dL (ref 0.44–1.00)
GFR, Estimated: >60 mL/min (ref >60)
Glucose, Bld: 146 mg/dL — ABNORMAL HIGH (ref 70–99)
Potassium: 3.8 mmol/L (ref 3.5–5.1)
Sodium: 136 mmol/L (ref 135–145)
Total Bilirubin: 0.5 mg/dL (ref 0.3–1.2)
Total Protein: 5.1 g/dL — ABNORMAL LOW (ref 6.5–8.1)

## 2020-11-21 LAB — PHOSPHORUS: Phosphorus: 3.1 mg/dL (ref 2.5–4.6)

## 2020-11-21 LAB — MAGNESIUM: Magnesium: 1.7 mg/dL (ref 1.7–2.4)

## 2020-11-21 MED ORDER — APIXABAN 5 MG PO TABS: 5.0000 mg | ORAL_TABLET | Freq: Two times a day (BID) | ORAL | 1 refills | Status: AC

## 2020-11-21 MED ORDER — TRACE MINERALS CU-MN-SE-ZN 300-55-60-3000 MCG/ML IV SOLN
INTRAVENOUS | Status: DC
  Filled 2020-11-21: qty 528

## 2020-11-21 MED ORDER — LEVOFLOXACIN 500 MG PO TABS: 500.0000 mg | ORAL_TABLET | Freq: Every day | ORAL | 0 refills | Status: AC

## 2020-11-21 MED ORDER — VITAMIN C 1000 MG PO TABS: 1000.0000 mg | ORAL_TABLET | Freq: Every day | ORAL | 0 refills | Status: AC

## 2020-11-21 MED ORDER — MAGNESIUM SULFATE 2 GM/50ML IV SOLN
2.0000 g | Freq: Once | INTRAVENOUS | Status: AC
  Administered 2020-11-21: 2 g via INTRAVENOUS
  Filled 2020-11-21: qty 50

## 2020-11-21 NOTE — Consult Note (Addendum)
PHARMACY - TOTAL PARENTERAL NUTRITION CONSULT NOTE   Indication:  Improve nourishment prior to major surgery TPN was started at [facility] about 7 to 8 weeks ago  Patient Measurements: Height: 5\' 6"  (167.6 cm) Weight: 49.5 kg (109 lb 3.2 oz) IBW/kg (Calculated) : 59.3 TPN AdjBW (KG): 44.9 Body mass index is 17.63 kg/m. Usual Weight: 44.9kg  Assessment:  75yo F with PMH of chronic bronchiectasis on 2 L home oxygen, atrial fibrillation on eliquis, CAD, CHF, large hiatal hernia who presented to hospital for chest pain and SOB. Pt is scheduled for surgery on Nov 1st at [facility] for large hiatal hernia, pt is receiving TPN to improve nourishment prior to surgery. Pharmacy was consulted for TPN management.    Glucose / Insulin: SSI q4h (sens)  8 units/24 hrs BG 94-159.  On solu-medrol Electrolytes: Phos noted to be elevated to 5.0 on 10/20, reduced from 15>30mmol/L in TPN  -on lasix daily Renal: Scr<1.0 Hepatic: LFT within normal limits  Intake / Output; MIVF: no MIVF  Central access: right PICC TPN start date: Pt was on TPA prior to admission, but will start 10/20 1800  Nutritional Goals: Goal TPN rate is 55 mL/hr (provides 79.5 g of protein (Clinisol 15%) and 1610.4 kcals per day)  RD Assessment: Estimated Needs Total Energy Estimated Needs: 1500-1700kcal/day Total Protein Estimated Needs: 75-85g/day Total Fluid Estimated Needs: 1.2-1.4L/day  Current Nutrition:  Reg Heart diet started 10/21  Plan:  Continue TPN at 61mL/hr at 1800 Electrolytes in TPN: Na 45mEq/L, K 70mEq/L, Ca 21mEq/L, Mg 9mEq/L, and continue Phos adjusted to 5 mmol/L. Cl:Ac 1:1 -Mag 1.7:  Will order Magnesium sulfate 2 gm IV x 1-on lasix daily Add standard MVI and trace elements to TPN Sensitive q4h SSI and adjust as needed -on solumedrol Monitor CMP, Mg, Phos daily for 3 days then CMP, Mg, Phos on Mon/Thurs, with triglycerides weekly    Chinita Greenland PharmD Clinical Pharmacist 11/21/2020

## 2020-11-21 NOTE — Plan of Care (Signed)

## 2020-11-21 NOTE — Discharge Summary (Addendum)
[facility] NAME: Ana Washington    MR#:  [id number]  DATE OF BIRTH:  [birth year]  DATE OF ADMISSION:  11/17/2020 ADMITTING PHYSICIAN: Para Skeans, MD  DATE OF DISCHARGE: 11/21/2020  PRIMARY CARE PHYSICIAN: [name], MD    ADMISSION DIAGNOSIS:  Shortness of breath [R06.02] Other chest pain [R07.89] NSTEMI (non-ST elevated myocardial infarction) ([place]) [I21.4] Chest pain [R07.9]  DISCHARGE DIAGNOSIS:   Acute on chronic hypoxic respiratory failure with history of acute on chronic advanced bronchiectasis and history of MAC (chronic) SECONDARY DIAGNOSIS:   Past Medical History:  Diagnosis Date  . Arthritis   . Atrial fibrillation ([place])   . Bronchiectasis ([place])   . CAD (coronary artery disease) 07/30/2017  . CHF (congestive heart failure) ([place])   . Dumping syndrome   . 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 Family history of adverse reaction to anesthesia    sister [name]  . GERD (gastroesophageal reflux disease)   . Headache    MIGRAINES  . Myocardial infarction ([place]) 2007   Non-STEMI  . [name] (postoperative nausea and vomiting)   . Psoriasis   . PUD (peptic ulcer disease)     HOSPITAL COURSE:   Ana Washington is a 75 y.o. female chronic bronchiectasis on 2 L home oxygen, atrial fibrillation on eliquis, CAD, CHF, large hiatal hernia who presents to the ED for evaluation of chest pain and SOB   Acute on chronic respiratory failure secondary to acute on chronic bronchiectasis flare secondary to MAC -- patient on chronic home oxygen -- pulmonary consultation with Dr.Aleskerov -- taper IV steroids according to pulmonary recommendation -- IV Levaquin--chang eto po for total 7 days per Dr Mickey Farber recs -- continue  duo nebs PRN.   Chest pain with history of CAD/?NSTEMI -- started on on IV heparin drip -- cardiology consultation with Dr. Nehemiah Massed-- informed via secure chat -- hold eliquis -- continue losartan,  beta-blockers, statins --10/20-- patient underwent cardiac cath .  Mid LAD lesion is 40% stenosed. .  The left ventricular systolic function is normal. .  LV end diastolic pressure is moderately elevated. .  The left ventricular ejection fraction is 55-65% by visual estimate. .  Hemodynamic findings consistent with mild pulmonary hypertension. .  There is mild (2+) mitral regurgitation. Medical management recommended -- heparin drip discontinued    large hiatal hernia-- symptomatic history of TPN -- patient is scheduled to have surgery on November 1 at [facility] -- she currently is getting IV TPN through PICC line by [facility] for last 7 to 8 weeks to improve her nourishment prior to major surgery -- dietitian and pharmacy consultation for TPN   Gerd/reflux esophagitis -- continue PPI and Carafate   chronic atrial fibrillation on chronic anticoagulation -- continue metoprolol -- hold eliquis since patient was on IV heparin drip --10/20-- resume eliquis today--d/w pharmacy    Nutrition Status: Nutrition Problem: Severe Malnutrition Etiology: chronic illness (large hiatal hernia) Signs/Symptoms: severe muscle depletion, moderate fat depletion     Procedures: cath Family communication : none Consults : pulmonary CODE STATUS: DNR prior to admission DVT Prophylaxis : heparin drip Level of care: Progressive Cardiac Status is: Inpatient  patient overall improving. She will discharged to home with home health resumption and follow-up pulmonology as outpatient.   CONSULTS OBTAINED:  Treatment Team:  Corey Skains, MD Ottie Glazier, MD  DRUG ALLERGIES:   Allergies  Allergen Reactions  . Codeine Nausea And Vomiting  . Sulfa Antibiotics  Diarrhea and Anxiety    "Makes her feel shaky."  . Penicillins Rash    Has patient had a PCN reaction causing immediate rash, facial/tongue/throat swelling, SOB or lightheadedness with hypotension: Unknown Has patient had a PCN reaction causing  severe rash involving mucus membranes or skin necrosis: Unknown Has patient had a PCN reaction that required hospitalization: Unknown Has patient had a PCN reaction occurring within the last 10 years: No If all of the above answers are "NO", then may proceed with Cephalosporin use.     DISCHARGE MEDICATIONS:   Allergies as of 11/21/2020       Reactions   Codeine Nausea And Vomiting   Sulfa Antibiotics Diarrhea, Anxiety   "Makes her feel shaky."   Penicillins Rash   Has patient had a PCN reaction causing immediate rash, facial/tongue/throat swelling, SOB or lightheadedness with hypotension: Unknown Has patient had a PCN reaction causing severe rash involving mucus membranes or skin necrosis: Unknown Has patient had a PCN reaction that required hospitalization: Unknown Has patient had a PCN reaction occurring within the last 10 years: No If all of the above answers are "NO", then may proceed with Cephalosporin use.        Medication List     STOP taking these medications    acetylcysteine 20 % nebulizer solution Commonly known as: MUCOMYST   azithromycin 250 MG tablet Commonly known as: ZITHROMAX       TAKE these medications    acetaminophen 500 MG tablet Commonly known as: TYLENOL Take 1,000 mg by mouth every 6 (six) hours as needed for mild pain or headache.   amiodarone 200 MG tablet Commonly known as: PACERONE Take 1 tablet (200 mg total) by mouth 2 (two) times daily.   apixaban 5 MG Tabs tablet Commonly known as: ELIQUIS Take 1 tablet (5 mg total) by mouth 2 (two) times daily. What changed:  medication strength how much to take Another medication with the same name was removed. Continue taking this medication, and follow the directions you see here.   bacitracin ointment Apply 1 application topically as directed.   benzonatate 200 MG capsule Commonly known as: TESSALON Take 200 mg by mouth 3 (three) times daily as needed.   Dermacloud Oint Apply 1  application topically as needed.   digoxin 0.125 MG tablet Commonly known as: LANOXIN Take 62.5 mcg by mouth daily.   ferrous sulfate 325 (65 FE) MG EC tablet Take 325 mg by mouth daily.   furosemide 20 MG tablet Commonly known as: LASIX Take 1 tablet (20 mg total) by mouth daily as needed.   furosemide 40 MG tablet Commonly known as: LASIX Take 40 mg by mouth daily.   gabapentin 300 MG capsule Commonly known as: NEURONTIN Take 300 mg by mouth at bedtime as needed.   levalbuterol 0.31 MG/3ML nebulizer solution Commonly known as: XOPENEX Inhale 3 mLs into the lungs every 6 (six) hours as needed.   levofloxacin 500 MG tablet Commonly known as: Levaquin Take 1 tablet (500 mg total) by mouth daily for 4 days. Start taking on: November 22, 2020   LORazepam 0.5 MG tablet Commonly known as: ATIVAN Take 1 tablet (0.5 mg total) by mouth at bedtime. What changed:  when to take this reasons to take this   losartan 50 MG tablet Commonly known as: COZAAR Take 50 mg by mouth daily.   metoprolol succinate 25 MG 24 hr tablet Commonly known as: TOPROL-XL Take 25 mg by mouth daily.   multivitamin with  minerals Tabs tablet Take 1 tablet by mouth daily.   ondansetron 4 MG disintegrating tablet Commonly known as: ZOFRAN-ODT Take 4 mg by mouth every 8 (eight) hours as needed. Take 1 tablet (4 mg total) by mouth every eight (8) hours as needed for nausea for up to 15 days.   pantoprazole 40 MG tablet Commonly known as: PROTONIX Take 40 mg by mouth 2 (two) times daily. Before meals   polyethylene glycol 17 g packet Commonly known as: MIRALAX / GLYCOLAX Take 17 g by mouth daily as needed for severe constipation.   predniSONE 10 MG tablet Commonly known as: DELTASONE Take 5 mg by mouth daily. Then taper to 2.5 mg starting 11-22-20 for 2 weeks.   senna 8.6 MG Tabs tablet Commonly known as: SENOKOT Take 2 tablets (17.2 mg total) by mouth at bedtime.   Sodium Chloride (Inhalant)  7 % Nebu Inhale 4 mLs into the lungs 2 (two) times daily.   sucralfate 1 GM/10ML suspension Commonly known as: CARAFATE Take 10 mLs by mouth every 6 (six) hours.   vitamin C 1000 MG tablet Take 1 tablet (1,000 mg total) by mouth daily.        If you experience worsening of your admission symptoms, develop shortness of breath, life threatening emergency, suicidal or homicidal thoughts you must seek medical attention immediately by calling 911 or calling your MD immediately  if symptoms less severe.  You Must read complete instructions/literature along with all the possible adverse reactions/side effects for all the Medicines you take and that have been prescribed to you. Take any new Medicines after you have completely understood and accept all the possible adverse reactions/side effects.   Please note  You were cared for by a hospitalist during your hospital stay. If you have any questions about your discharge medications or the care you received while you were in the hospital after you are discharged, you can call the unit and asked to speak with the hospitalist on call if the hospitalist that took care of you is not available. Once you are discharged, your primary care physician will handle any further medical issues. Please note that NO REFILLS for any discharge medications will be authorized once you are discharged, as it is imperative that you return to your primary care physician (or establish a relationship with a primary care physician if you do not have one) for your aftercare needs so that they can reassess your need for medications and monitor your lab values. Today   SUBJECTIVE   Feels weak  VITAL SIGNS:  Blood pressure (!) 150/81, pulse 74, temperature 97.9 F (36.6 C), temperature source Oral, resp. rate 19, height _0  (1.676 m), weight 49.5 kg, SpO2 99 %.  I/O:   Intake/Output Summary (Last 24 hours) at 11/21/2020 1128 Last data filed at 11/21/2020 1059 Gross per  24 hour  Intake 1913.3 ml  Output 1800 ml  Net 113.3 ml    PHYSICAL EXAMINATION:  GENERAL:  75 y.o.-year-old patient lying in the bed with no acute distress.  LUNGS: decreasedbreath sounds bilaterally, no wheezing, rales,rhonchi or crepitation. No use of accessory muscles of respiration.  CARDIOVASCULAR: S1, S2 normal. No murmurs, rubs, or gallops.  ABDOMEN: Soft, non-tender, non-distended. Bowel sounds present. No organomegaly or mass.  EXTREMITIES: No pedal edema, cyanosis, or clubbing.  NEUROLOGIC: Cranial nerves II through XII are intact. Muscle strength 5/5 in all extremities. Sensation intact. Gait not checked. weak PSYCHIATRIC: The patient is alert and oriented x 3.  SKIN:  No obvious rash, lesion, or ulcer.   DATA REVIEW:   CBC  Recent Labs  Lab 11/19/20 0611  WBC 22.3*  HGB 10.5*  HCT 32.8*  PLT 271    Chemistries  Recent Labs  Lab 11/21/20 0550  NA 136  K 3.8  CL 92*  CO2 37*  GLUCOSE 146*  BUN 31*  CREATININE 0.55  CALCIUM 8.8*  MG 1.7  AST 32  ALT 39  ALKPHOS 82  BILITOT 0.5    Microbiology Results   Recent Results (from the past 240 hour(s))  Resp Panel by RT-PCR (Flu A&B, Covid) Nasopharyngeal Swab     Status: None   Collection Time: 11/17/20 10:41 PM   Specimen: Nasopharyngeal Swab; Nasopharyngeal(NP) swabs in vial transport medium  Result Value Ref Range Status   SARS Coronavirus 2 by RT PCR NEGATIVE NEGATIVE Final    Comment: (NOTE) SARS-CoV-2 target nucleic acids are NOT DETECTED.  The SARS-CoV-2 RNA is generally detectable in upper respiratory specimens during the acute phase of infection. The lowest concentration of SARS-CoV-2 viral copies this assay can detect is 138 copies/mL. A negative result does not preclude SARS-Cov-2 infection and should not be used as the sole basis for treatment or other patient management decisions. A negative result may occur with  improper specimen collection/handling, submission of specimen other than  nasopharyngeal swab, presence of viral mutation(s) within the areas targeted by this assay, and inadequate number of viral copies(<138 copies/mL). A negative result must be combined with clinical observations, patient history, and epidemiological information. The expected result is Negative.  Fact Sheet for Patients:  EntrepreneurPulse.com.au  Fact Sheet for Healthcare Providers:  IncredibleEmployment.be  This test is no t yet approved or cleared by the [place] FDA and  has been authorized for detection and/or diagnosis of SARS-CoV-2 by FDA under an Emergency Use Authorization (EUA). This EUA will remain  in effect (meaning this test can be used) for the duration of the COVID-19 declaration under Section 564(b)(1) of the Act, 21 U.S.C.section 360bbb-3(b)(1), unless the authorization is terminated  or revoked sooner.       Influenza A by PCR NEGATIVE NEGATIVE Final   Influenza B by PCR NEGATIVE NEGATIVE Final    Comment: (NOTE) The Xpert Xpress SARS-CoV-2/FLU/RSV plus assay is intended as an aid in the diagnosis of influenza from Nasopharyngeal swab specimens and should not be used as a sole basis for treatment. Nasal washings and aspirates are unacceptable for Xpert Xpress SARS-CoV-2/FLU/RSV testing.  Fact Sheet for Patients: EntrepreneurPulse.com.au  Fact Sheet for Healthcare Providers: IncredibleEmployment.be  This test is not yet approved or cleared by the [place] FDA and has been authorized for detection and/or diagnosis of SARS-CoV-2 by FDA under an Emergency Use Authorization (EUA). This EUA will remain in effect (meaning this test can be used) for the duration of the COVID-19 declaration under Section 564(b)(1) of the Act, 21 U.S.C. section 360bbb-3(b)(1), unless the authorization is terminated or revoked.  Performed at [facility], [address], [place], [place]  [address]     RADIOLOGY:  CARDIAC CATHETERIZATION  Result Date: 11/19/2020 .  Mid LAD lesion is 40% stenosed. .  The left ventricular systolic function is normal. .  LV end diastolic pressure is moderately elevated. .  The left ventricular ejection fraction is 55-65% by visual estimate. .  Hemodynamic findings consistent with mild pulmonary hypertension. .  There is mild (2+) mitral regurgitation. 75 year old female with paroxysmal nonvalvular atrial fibrillation previous known coronary artery atherosclerosis and  hypertension hyperlipidemia with acute exacerbation of pulmonary concerns and hypoxia with elevated troponin Left ventricle with normal LV systolic function ejection fraction of 60% Mild pulmonary hypertension with mean pulmonary pressures of 29 mm Minimal left anterior descending artery atherosclerosis Plan Continue medical management and treatment of exacerbation of lung disease High intensity cholesterol therapy for minimal coronary atherosclerosis Continuation of amiodarone and or beta-blocker for hypertension control and maintenance of normal sinus rhythm Reinstatement of anticoagulation for further risk reduction and stroke with atrial fibrillation Begin rehabilitation and no further cardiac intervention at this time     CODE STATUS:     Code Status Orders  (From admission, onward)           Start     Ordered   11/18/20 0024  Do not attempt resuscitation (DNR)  Continuous       Question Answer Comment  In the event of cardiac or respiratory ARREST Do not call a "code blue"   In the event of cardiac or respiratory ARREST Do not perform Intubation, CPR, defibrillation or ACLS   In the event of cardiac or respiratory ARREST Use medication by any route, position, wound care, and other measures to relive pain and suffering. May use oxygen, suction and manual treatment of airway obstruction as needed for comfort.      11/18/20 0023           Code Status History     Date  Active Date Inactive Code Status Order ID Comments User Context   09/14/2020 0156 09/15/2020 1739 DNR [id number]  Christel Mormon, MD ED   08/24/2020 0541 08/27/2020 2056 DNR [id number]  Athena Masse, MD ED   07/30/2020 2312 08/07/2020 1903 DNR [id number]  Sidney Ace, Arvella Merles, MD ED   07/14/2020 1452 07/20/2020 2312 DNR [id number]  Vernelle Emerald, MD ED   04/29/2020 0551 05/02/2020 1832 DNR [id number]  Mansy, Arvella Merles, MD ED   09/08/2019 0853 09/10/2019 2215 DNR [id number]  Karmen Bongo, MD ED   09/27/2018 1816 10/11/2018 1818 DNR [id number]  Demetrios Loll, MD ED   04/19/2018 1147 04/19/2018 1549 Full Code [id number]  Hessie Knows, MD Inpatient   04/08/2018 1817 04/14/2018 1637 DNR [id number]  Tyler Pita, MD Inpatient   04/08/2018 0752 04/08/2018 1817 Full Code [id number]  Harrie Foreman, MD Inpatient   04/05/2018 1455 04/05/2018 1831 Full Code [id number]  Hessie Knows, MD Inpatient   01/05/2018 1836 01/06/2018 1504 Full Code [id number]  Hessie Knows, MD Inpatient   12/18/2017 1741 12/18/2017 2125 Full Code [id number]  Hessie Knows, MD Inpatient   11/29/2017 1555 11/29/2017 2013 Full Code [id number]  Hessie Knows, MD Inpatient   07/31/2017 1054 08/03/2017 1524 DNR [id number]  Demetrios Loll, MD Inpatient   07/31/2017 0057 07/31/2017 1054 Full Code [id number]  Lance Coon, MD Inpatient   07/05/2016 0931 07/05/2016 1311 Full Code [id number]  Hessie Knows, MD Inpatient      Advance Directive Documentation    Flowsheet Row Most Recent Value  Type of Advance Directive Healthcare Power of Attorney, Living will  Pre-existing out of facility DNR order (yellow form or pink MOST form) --  "MOST" Form in Place? --        TOTAL TIME TAKING CARE OF THIS PATIENT: 40 minutes.    Fritzi Mandes M.D  [facility]    CC: Primary care physician; [name], MD

## 2020-11-21 NOTE — Discharge Instructions (Addendum)
PICC line care per home health protocol.  Resume your TPN home health orders via [facility] use your oxygen, inhaler, nebulizer as before

## 2020-11-21 NOTE — TOC Transition Note (Signed)
Transition of Care [facility]) - CM/SW Discharge Note   Patient Details  Name: Ana Washington MRN: [id number] Date of Birth: March 16, 1945  Transition of Care [facility]) CM/SW Contact:  Harriet Masson, RN Phone Number: 11/21/2020, 2:04 PM   Clinical Narrative:    Order for PT/OT upon discharge. RN spoke with pt who indicated she already as [facility] for these services and has called the agency to resume services. Pt was not able to recite the agency's name for RNCM to call but again states she has services. Pt lives with her spouse for a good support system, able to afford all her medications and has a RW, wheelchair already in the home. Pt ride will be here for pick up today at 3:00pm.  TOC remains available to assist for further discharge needs.   Final next level of care: [place] Barriers to Discharge: No Barriers Identified   Patient Goals and CMS Choice        Discharge Placement                    Patient and family notified of of transfer: 11/21/20  Discharge Plan and Services                                     Social Determinants of Health (SDOH) Interventions     Readmission Risk Interventions Readmission Risk Prevention Plan 07/17/2020  Transportation Screening Complete  PCP or Specialist Appt within 3-5 Days Complete  HRI or [place] Complete  Social Work Consult for [place] Planning/Counseling Complete  Palliative Care Screening Not Applicable  Medication Review Press photographer) Complete  Some recent data might be hidden

## 2020-11-25 ENCOUNTER — Emergency Department: Payer: Medicare HMO

## 2020-11-25 ENCOUNTER — Other Ambulatory Visit: Payer: Self-pay

## 2020-11-25 ENCOUNTER — Emergency Department
Admission: EM | Admit: 2020-11-25 | Discharge: 2020-12-01 | Disposition: E | Payer: Medicare HMO | Attending: Emergency Medicine | Admitting: Emergency Medicine

## 2020-11-25 DIAGNOSIS — Z7901 Long term (current) use of anticoagulants: Secondary | ICD-10-CM | POA: Diagnosis not present

## 2020-11-25 DIAGNOSIS — Z79899 Other long term (current) drug therapy: Secondary | ICD-10-CM | POA: Diagnosis not present

## 2020-11-25 DIAGNOSIS — I110 Hypertensive heart disease with heart failure: Secondary | ICD-10-CM | POA: Insufficient documentation

## 2020-11-25 DIAGNOSIS — I5033 Acute on chronic diastolic (congestive) heart failure: Secondary | ICD-10-CM | POA: Diagnosis not present

## 2020-11-25 DIAGNOSIS — Z8616 Personal history of COVID-19: Secondary | ICD-10-CM | POA: Insufficient documentation

## 2020-11-25 DIAGNOSIS — I4891 Unspecified atrial fibrillation: Secondary | ICD-10-CM | POA: Insufficient documentation

## 2020-11-25 DIAGNOSIS — I2510 Atherosclerotic heart disease of native coronary artery without angina pectoris: Secondary | ICD-10-CM | POA: Diagnosis not present

## 2020-11-25 DIAGNOSIS — Z20822 Contact with and (suspected) exposure to covid-19: Secondary | ICD-10-CM | POA: Insufficient documentation

## 2020-11-25 DIAGNOSIS — I469 Cardiac arrest, cause unspecified: Secondary | ICD-10-CM | POA: Insufficient documentation

## 2020-11-25 LAB — RESP PANEL BY RT-PCR (FLU A&B, COVID) ARPGX2
Influenza A by PCR: NEGATIVE
Influenza B by PCR: NEGATIVE
SARS Coronavirus 2 by RT PCR: NEGATIVE

## 2020-11-25 LAB — CBC WITH DIFFERENTIAL/PLATELET
Abs Immature Granulocytes: 1.41 10*3/uL — ABNORMAL HIGH (ref 0.00–0.07)
Basophils Absolute: 0.2 10*3/uL — ABNORMAL HIGH (ref 0.0–0.1)
Basophils Relative: 1 %
Eosinophils Absolute: 0 10*3/uL (ref 0.0–0.5)
Eosinophils Relative: 0 %
HCT: 30.5 % — ABNORMAL LOW (ref 36.0–46.0)
Hemoglobin: 9.3 g/dL — ABNORMAL LOW (ref 12.0–15.0)
Immature Granulocytes: 5 %
Lymphocytes Relative: 3 %
Lymphs Abs: 0.8 10*3/uL (ref 0.7–4.0)
MCH: 37.2 pg — ABNORMAL HIGH (ref 26.0–34.0)
MCHC: 30.5 g/dL (ref 30.0–36.0)
MCV: 122 fL — ABNORMAL HIGH (ref 80.0–100.0)
Monocytes Absolute: 1 10*3/uL (ref 0.1–1.0)
Monocytes Relative: 3 %
Neutro Abs: 26 10*3/uL — ABNORMAL HIGH (ref 1.7–7.7)
Neutrophils Relative %: 88 %
Platelets: 220 10*3/uL (ref 150–400)
RBC: 2.5 MIL/uL — ABNORMAL LOW (ref 3.87–5.11)
RDW: 13.2 % (ref 11.5–15.5)
Smear Review: NORMAL
WBC: 29.4 10*3/uL — ABNORMAL HIGH (ref 4.0–10.5)
nRBC: 0 % (ref 0.0–0.2)

## 2020-11-25 LAB — PROTIME-INR
INR: 1.3 — ABNORMAL HIGH (ref 0.8–1.2)
Prothrombin Time: 16.2 seconds — ABNORMAL HIGH (ref 11.4–15.2)

## 2020-11-25 LAB — APTT: aPTT: 49 seconds — ABNORMAL HIGH (ref 24–36)

## 2020-11-25 MED ORDER — ATROPINE SULFATE 1 MG/10ML IJ SOSY
PREFILLED_SYRINGE | INTRAMUSCULAR | Status: AC
  Filled 2020-11-25: qty 10

## 2020-11-25 MED ORDER — LACTATED RINGERS IV BOLUS (SEPSIS)
1000.0000 mL | Freq: Once | INTRAVENOUS | Status: DC
  Administered 2020-11-25: 1000 mL via INTRAVENOUS

## 2020-11-30 ENCOUNTER — Ambulatory Visit: Payer: Medicare HMO | Admitting: Family

## 2020-12-01 NOTE — ED Triage Notes (Signed)
Pt Brought from home via EMS. Pt is being bagged by EMS at this time. BG was 51 and was given an amp of D50 with EMS. Pt appears greatly mottled.  DNR form is present. Per EMS family gave her morphine drops around 530pm last night and became only responsive to pain at that time.

## 2020-12-01 NOTE — Progress Notes (Signed)
  Chaplain On-Call received phone call from ED Unit Secretary Lattie Haw who reported the death of the patient and the presence of family.  Chaplain received clinical and social history update from [employer], and met patient's daughter Lane Hacker at bedside.  Marlowe Kays described the recent difficult medical problems for the patient, as well as reviewing several prior illnesses and hospitalizations that affected the patient and family. Chaplain facilitated this process of life review and the recalling of memories.  Chaplain also provided much spiritual and emotional support and prayer.  Patient's time of death was 43. The On-Call Chaplain at that time was not called.  Chaplain Pollyann Samples M.Div., [facility]

## 2020-12-01 NOTE — ED Notes (Signed)
Family arrived.  Escorted to family room, Dr Karma Greaser met with family member to inform of passing.  Additional family members en route.

## 2020-12-01 NOTE — ED Provider Notes (Signed)
[facility] Emergency Department Provider Note  ____________________________________________   Event Date/Time   First MD Initiated Contact with Patient 12/07/20 [phone]     (approximate)  I have reviewed the triage vital signs and the nursing notes.   HISTORY  Chief Complaint Respiratory Distress  Level 5 caveat:  history/ROS limited by acute/critical illness  HPI Ana Washington is a 75 y.o. female with medical history as listed below who presents by EMS for decreased level of consciousness and respiratory difficulties.  History is somewhat limited at this time, but based on report from paramedics and what can be gleaned from the chart, the patient has severe malnutrition and has been receiving TPN in anticipation of a hiatal hernia repair surgery at [facility] in about a week.  She has a history of chronic hypoxic respiratory failure with chronic advanced bronchiectasis and history of MAC.  She was admitted to this hospital about 8 days ago and was discharged about 4 days ago for acute on chronic respiratory failure, among other issues.  Report provided to me is that she is cared for by her daughter.  Her daughter told paramedics that she administered some morphine drops yesterday morning at sometime around 5:30 AM.  The patient has been minimally responsive since that time (approximately 25 hours ago).  When she had not woken up by this morning the patient's daughter called 911.  Reportedly she wanted them to take the patient to [facility], but given the patient's critical status, they had to bring her to the nearest emergency department.  The patient arrived with a valid goldenrod DNR form.     Past Medical History:  Diagnosis Date   Arthritis    Atrial fibrillation ([place])    Bronchiectasis (HCC)    CAD (coronary artery disease) 07/30/2017   CHF (congestive heart failure) (HCC)    Dumping syndrome    Essential hypertension, malignant 10/03/2013   Family  history of adverse reaction to anesthesia    sister [name]   GERD (gastroesophageal reflux disease)    Headache    MIGRAINES   Myocardial infarction ([place]) 2007   Non-STEMI   [name] (postoperative nausea and vomiting)    Psoriasis    PUD (peptic ulcer disease)     Patient Active Problem List   Diagnosis Date Noted   Acute on chronic respiratory failure with hypoxia ([place])    Protein-calorie malnutrition, severe 11/20/2020   NSTEMI (non-ST elevated myocardial infarction) ([place]) 11/17/2020   Chest pain 09/14/2020   Esophageal obstruction due to food impaction    Food impaction of esophagus    Congenital malformation of esophagus    Hiatal hernia    Gastric bezoar    Foreign body in esophagus    Diverticulosis of small intestine with hemorrhage    CHF (congestive heart failure) ([place]) 08/24/2020   Chronic hyponatremia 08/23/2020   Acute on chronic diastolic CHF (congestive heart failure) ([place]) 08/23/2020   Morphine Overdose, accidental or unintentional, initial encounter 08/23/2020   Bronchiectasis with acute exacerbation ([place]) 07/31/2020   CAP (community acquired pneumonia) 07/30/2020   Acute exacerbation of bronchiectasis ([place]) 07/14/2020   COVID-19 virus infection 07/14/2020   Lactic acidosis 07/14/2020   SIRS (systemic inflammatory response syndrome) ([place]) 07/14/2020   Elevated troponin 07/14/2020   Anxiety    Constipation    Pressure injury of right heel, stage 1    Bronchiectasis with (acute) exacerbation ([place]) 04/29/2020   Hemoptysis    Chronic respiratory failure with hypoxia ([place])  AKI (acute kidney injury) ([place])    Bronchiectasis ([place]) 09/08/2019   Atrial fibrillation, chronic ([place]) 09/08/2019   Closed right hip fracture ([place]) 09/27/2018   Intractable nausea and vomiting 04/08/2018   Atrial fibrillation with RVR ([place]) 04/08/2018   Thoracic radiculitis (Bilateral) 03/29/2018   Vasovagal episode 03/29/2018   Closed compression fracture of T10 thoracic vertebra,  sequela 03/29/2018   Abnormal MRI, lumbar spine (12/15/2016) 03/21/2018   Lumbar compression fractures, sequela (L1, L2, L3, L4, and L5) 03/21/2018   Closed compression fracture of L1 lumbar vertebra, sequela 03/21/2018   Closed compression fracture of L2 lumbar vertebra, sequela 03/21/2018   Closed compression fracture of L3 lumbar vertebra, sequela 03/21/2018   Closed compression fracture of L4 lumbar vertebra, sequela 03/21/2018   Closed compression fracture of L5 lumbar vertebra, sequela 03/21/2018   Thoracic compression fracture, sequela (T5, T9, T10, T11, and T12) 03/21/2018   Closed compression fracture of T5 thoracic vertebra, sequela 03/21/2018   Closed compression fracture of T9 thoracic vertebra, sequela 03/21/2018   Close compression fracture of T11 thoracic vertebra, sequela 03/21/2018   Closed compression fracture of T12 thoracic vertebra, sequela 03/21/2018   Lumbar facet hypertrophy 03/21/2018   Grade 1  Lumbar Anterolisthesis of L3/4 and L4/5 03/21/2018   Lumbar central spinal stenosis (Multilevel), 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 claudication 03/21/2018   Chronic anticoagulation (ELIQUIS) 03/21/2018   History of pelvic fracture 03/21/2018   Chronic musculoskeletal pain 03/21/2018   Neurogenic pain 03/21/2018   Long term prescription benzodiazepine use 03/21/2018   DDD (degenerative disc disease), 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   Psoriasis 03/01/2018   Chronic upper back pain (Primary Area of Pain) (Bilateral) (R>L) 03/01/2018   Chronic low back pain (Secondary Area of Pain) (Bilateral) (R>L) w/o sciatica 03/01/2018   Chronic pain syndrome 03/01/2018   Long term current use of opiate analgesic 03/01/2018   Pharmacologic therapy 03/01/2018   Disorder of skeletal  system 03/01/2018   Problems influencing health status 03/01/2018   History of kyphoplasty (L1, L2, L3, T9, T11, and T12) 01/05/2018   Malnutrition of moderate degree ([place]) 08/03/2017   GERD (gastroesophageal reflux disease) 07/30/2017   Pelvic fracture ([place]) 07/30/2017   Dysphagia 09/13/2016   Unintended weight loss 09/13/2016   Essential hypertension 10/03/2013   Osteoarthritis of knees (Bilateral) 10/03/2013   Primary localized osteoarthrosis, lower leg 10/03/2013   Non-ischemic cardiomyopathy ([place]) 09/12/2013   Coronary artery disease involving native coronary artery of native heart without angina pectoris 07/28/2012   Hyperlipidemia, mixed 07/28/2012   DDD (degenerative disc disease), lumbar 02/29/2012    Past Surgical History:  Procedure Laterality Date   BACK SURGERY     CHOLECYSTECTOMY     ESOPHAGOGASTRODUODENOSCOPY N/A 09/04/2020   Procedure: ESOPHAGOGASTRODUODENOSCOPY (EGD);  Surgeon: Virgel Manifold, MD;  Location: [facility] ENDOSCOPY;  Service: Endoscopy;  Laterality: N/A;   ESOPHAGOGASTRODUODENOSCOPY (EGD) WITH PROPOFOL N/A 03/19/2019   Procedure: ESOPHAGOGASTRODUODENOSCOPY (EGD) WITH PROPOFOL;  Surgeon: [name], MD;  Location: [facility] ENDOSCOPY;  Service: Gastroenterology;  Laterality: N/A;  *Note to anesthesia: Per pt's pulmonologist, if intubating, please extubate to BIPAP.   EYE SURGERY     FOOT SURGERY     INTRAMEDULLARY (IM) NAIL INTERTROCHANTERIC Right 09/30/2018   Procedure: INTRAMEDULLARY (IM) NAIL INTERTROCHANTRIC;  Surgeon: Dereck Leep, MD;  Location: [facility] ORS;  Service: Orthopedics;  Laterality: Right;  KYPHOPLASTY N/A 07/05/2016   Procedure: KYPHOPLASTY T - 9;  Surgeon: Hessie Knows, MD;  Location: [facility] ORS;  Service: Orthopedics;  Laterality: N/A;   KYPHOPLASTY N/A 11/29/2017   Procedure: Iona Hansen;  Surgeon: Hessie Knows, MD;  Location: [facility] ORS;  Service: Orthopedics;  Laterality: N/A;  L2 and L3   KYPHOPLASTY N/A 12/18/2017   Procedure:  KYPHOPLASTY L1;  Surgeon: Hessie Knows, MD;  Location: [facility] ORS;  Service: Orthopedics;  Laterality: N/A;   KYPHOPLASTY N/A 01/05/2018   Procedure: KYPHOPLASTY-T11,T12;  Surgeon: Hessie Knows, MD;  Location: [facility] ORS;  Service: Orthopedics;  Laterality: N/A;   KYPHOPLASTY N/A 04/05/2018   Procedure: T10 KYPHOPLASTY;  Surgeon: Hessie Knows, MD;  Location: [facility] ORS;  Service: Orthopedics;  Laterality: N/A;   KYPHOPLASTY N/A 04/12/2018   Procedure: KYPHOPLASTY T7,8;  Surgeon: Hessie Knows, MD;  Location: [facility] ORS;  Service: Orthopedics;  Laterality: N/A;   KYPHOPLASTY N/A 04/19/2018   Procedure: KYPHOPLASTY T5, T6;  Surgeon: Hessie Knows, MD;  Location: [facility] ORS;  Service: Orthopedics;  Laterality: N/A;   LUNG SURGERY  1990 and 1996   RIGHT/LEFT HEART CATH AND CORONARY ANGIOGRAPHY N/A 11/19/2020   Procedure: RIGHT/LEFT HEART CATH AND CORONARY ANGIOGRAPHY;  Surgeon: Corey Skains, MD;  Location: [facility];  Service: Cardiovascular;  Laterality: N/A;   THOROCOTOMY WITH LOBECTOMY     LEFT LOWER THORACOTOMY / RIGHT MIDDLE LOBECTOMY    Prior to Admission medications   Medication Sig Start Date End Date Taking? Authorizing Provider  acetaminophen (TYLENOL) 500 MG tablet Take 1,000 mg by mouth every 6 (six) hours as needed for mild pain or headache.     [provider]  amiodarone (PACERONE) 200 MG tablet Take 1 tablet (200 mg total) by mouth 2 (two) times daily. 08/07/20   Val Riles, MD  apixaban (ELIQUIS) 5 MG TABS tablet Take 1 tablet (5 mg total) by mouth 2 (two) times daily. 11/21/20   Fritzi Mandes, MD  Ascorbic Acid (VITAMIN C) 1000 MG tablet Take 1 tablet (1,000 mg total) by mouth daily. 11/21/20   Fritzi Mandes, MD  bacitracin ointment Apply 1 application topically as directed. 10/13/20   [provider]  benzonatate (TESSALON) 200 MG capsule Take 200 mg by mouth 3 (three) times daily as needed. 04/02/20   [provider]  digoxin (LANOXIN) 0.125 MG tablet  Take 62.5 mcg by mouth daily. 10/13/20   [provider]  ferrous sulfate 325 (65 FE) MG EC tablet Take 325 mg by mouth daily.     [provider]  furosemide (LASIX) 20 MG tablet Take 1 tablet (20 mg total) by mouth daily as needed. Patient not taking: Reported on 11/18/2020 09/16/20   Alisa Graff, FNP  furosemide (LASIX) 40 MG tablet Take 40 mg by mouth daily. 10/13/20   [provider]  gabapentin (NEURONTIN) 300 MG capsule Take 300 mg by mouth at bedtime as needed. 07/22/20   [provider]  [facility]) OINT Apply 1 application topically as needed. 08/17/20   [provider]  levalbuterol (XOPENEX) 0.31 MG/3ML nebulizer solution Inhale 3 mLs into the lungs every 6 (six) hours as needed. 12/10/19   [provider]  levofloxacin (LEVAQUIN) 500 MG tablet Take 1 tablet (500 mg total) by mouth daily for 4 days. 11/22/20 11/26/20  Fritzi Mandes, MD  LORazepam (ATIVAN) 0.5 MG tablet Take 1 tablet (0.5 mg total) by mouth at bedtime. Patient taking differently: Take 0.5 mg by mouth at bedtime  as needed. 09/10/19   Enzo Bi, MD  losartan (COZAAR) 50 MG tablet Take 50 mg by mouth daily. 08/28/20   [provider]  metoprolol succinate (TOPROL-XL) 25 MG 24 hr tablet Take 25 mg by mouth daily. 11/05/20   [provider]  Multiple Vitamin (MULTIVITAMIN WITH MINERALS) TABS tablet Take 1 tablet by mouth daily.    [provider]  ondansetron (ZOFRAN-ODT) 4 MG disintegrating tablet Take 4 mg by mouth every 8 (eight) hours as needed. Take 1 tablet (4 mg total) by mouth every eight (8) hours as needed for nausea for up to 15 days. 11/10/20 12-12-20  [provider]  pantoprazole (PROTONIX) 40 MG tablet Take 40 mg by mouth 2 (two) times daily. Before meals 11/17/20   [provider]  polyethylene glycol (MIRALAX / GLYCOLAX) 17 g packet Take 17 g by mouth daily as needed for severe constipation. 05/02/20    Loletha Grayer, MD  senna (SENOKOT) 8.6 MG TABS tablet Take 2 tablets (17.2 mg total) by mouth at bedtime. 05/02/20   Loletha Grayer, MD  Sodium Chloride, Inhalant, 7 % NEBU Inhale 4 mLs into the lungs 2 (two) times daily.    [provider]  sucralfate (CARAFATE) 1 GM/10ML suspension Take 10 mLs by mouth every 6 (six) hours. 11/06/20   [provider]    Allergies Codeine, Sulfa antibiotics, and Penicillins  Family History  Problem Relation Age of Onset   Hypertension Mother    Hypertension Father     Social History Social History   Tobacco Use   Smoking status: Never   Smokeless tobacco: Never  Vaping Use   Vaping Use: Never used  Substance Use Topics   Alcohol use: No   Drug use: No    Review of Systems Level 5 caveat:  history/ROS limited by acute/critical illness  ____________________________________________   PHYSICAL EXAM:  VITAL SIGNS: ED Triage Vitals  Enc Vitals Group     BP 2020-12-12 0632 (!) 82/69     Pulse Rate Dec 12, 2020 0632 68     Resp Dec 12, 2020 0632 (!) 27     Temp 12/12/20 0637 (!) 100.5 F (38.1 C)     Temp Source 12-12-20 0637 Axillary     SpO2 12-Dec-2020 0632 (!) 88 %     Weight --      Height --      Head Circumference --      Peak Flow --      Pain Score --      Pain Loc --      Pain Edu? --      Excl. in [place]? --     Constitutional: Toxic appearance, minimal responsiveness. Eyes: Pupils are dilated but responds sluggishly to light.  Head: Atraumatic. Nose: No congestion/rhinnorhea. Mouth/Throat: Patient is wearing a mask. Neck: No stridor.  No meningeal signs.   Cardiovascular: Normal rate, regular rhythm.  Poor peripheral circulation, bilateral feet and lower legs are cool to the touch.  PICC line is in place. Respiratory: Agonal respirations, about 1 every 3 seconds.  Coarse breath sounds throughout with evidence of aspiration. Gastrointestinal: Soft and nontender. No distention.  Musculoskeletal: Chronic wasting of  musculature.  No gross deformities of extremities. Neurologic: GCS is about 4-5; she has no verbal response, no eye-opening, and winces to painful stimuli such as a sternal rub or corneal reflex test, but she is not able to flex or extend. Skin:  Skin is warm, dry and intact, except for her distal lower  extremities, which are cool to the touch.   ____________________________________________   LABS (all labs ordered are listed, but only abnormal results are displayed)  Labs Reviewed  CBC WITH DIFFERENTIAL/PLATELET - Abnormal; Notable for the following components:      Result Value   WBC 29.4 (*)    RBC 2.50 (*)    Hemoglobin 9.3 (*)    HCT 30.5 (*)    MCV 122.0 (*)    MCH 37.2 (*)    Neutro Abs 26.0 (*)    Basophils Absolute 0.2 (*)    Abs Immature Granulocytes 1.41 (*)    All other components within normal limits  PROTIME-INR - Abnormal; Notable for the following components:   Prothrombin Time 16.2 (*)    INR 1.3 (*)    All other components within normal limits  APTT - Abnormal; Notable for the following components:   aPTT 49 (*)    All other components within normal limits  RESP PANEL BY RT-PCR (FLU A&B, COVID) [id number]   ____________________________________________  EKG  ED ECG REPORT I, Hinda Kehr, the attending physician, personally viewed and interpreted this ECG.  Date: 12-18-2020 EKG Time: 6:32 AM Rate: 67 Rhythm: Atrial fibrillation QRS Axis: Right axis deviation Intervals: normal ST/T Wave abnormalities: Non-specific ST segment / T-wave changes, but no clear evidence of acute ischemia. Narrative Interpretation: no definitive evidence of acute ischemia; does not meet STEMI criteria.  ____________________________________________  RADIOLOGY I, Hinda Kehr, personally viewed and evaluated these images (plain radiographs) as part of my medical decision making, as well as reviewing the written report by the radiologist.  ED MD interpretation: Worsening  bilateral bronchopneumonia, left greater than right.  Official radiology report(s): DG Chest Port 1 View  Result Date: 12/18/20 CLINICAL DATA:  75 year old female with possible sepsis. Respiratory distress. EXAM: PORTABLE CHEST 1 VIEW COMPARISON:  Chest x-ray 11/17/2020. FINDINGS: There is a right upper extremity PICC with tip terminating in the right atrium. Lung volumes are low. Diffuse peribronchial cuffing, widespread interstitial prominence and patchy ill-defined airspace opacities are noted in the lungs bilaterally (left greater than right), progressive compared to the prior study, most severe throughout the left mid to lower lung. Small left pleural effusion. Possible trace right pleural effusion. No pneumothorax. Pulmonary vasculature is largely obscured. Cardiac silhouette appears enlarged, however, this is likely artifactual secondary to the patient's large hiatal hernia. The patient is rotated to the right on today's exam, resulting in distortion of the mediastinal contours and reduced diagnostic sensitivity and specificity for mediastinal pathology. Atherosclerotic calcifications in the thoracic aorta. Suture line and surgical clips in the left perihilar region from prior left lower lobectomy. Post vertebroplasty changes are noted throughout the mid to lower thoracic spine and upper lumbar spine. IMPRESSION: 1. The appearance the chest suggests severe bronchitis and progressively worsening bilateral bronchopneumonia (left greater than right) with small left-sided parapneumonic pleural effusion and trace right-sided pleural effusion. 2. Status post left lower lobectomy. 3. Large hiatal hernia. Electronically Signed   By: Vinnie Langton M.D.   On: 12-18-20 07:20    ____________________________________________   PROCEDURES   Procedure(s) performed (including Critical Care):  .Critical Care Performed by: Hinda Kehr, MD Authorized by: Hinda Kehr, MD   Critical care provider  statement:    Critical care time (minutes):  30   Critical care time was exclusive of:  Separately billable procedures and treating other patients   Critical care was necessary to treat or prevent imminent or life-threatening deterioration of the following conditions:  Shock, respiratory failure and CNS failure or compromise   Critical care was time spent personally by me on the following activities:  Development of treatment plan with patient or surrogate, evaluation of patient's response to treatment, examination of patient, obtaining history from patient or surrogate, ordering and performing treatments and interventions, ordering and review of laboratory studies, ordering and review of radiographic studies, pulse oximetry, re-evaluation of patient's condition and review of old charts .1-3 Lead EKG Interpretation Performed by: Hinda Kehr, MD Authorized by: Hinda Kehr, MD     Interpretation: normal     ECG rate:  60   ECG rate assessment: normal     Rhythm: atrial fibrillation     Ectopy: none     Conduction: normal     ____________________________________________   INITIAL IMPRESSION / MDM / ASSESSMENT AND PLAN / ED COURSE  As part of my medical decision making, I reviewed the following data within the [facility] notes reviewed and incorporated, Labs reviewed , EKG interpreted , Old chart reviewed, Radiograph reviewed , and Notes from prior ED visits   Differential diagnosis includes, but is not limited to, sepsis, CVA, PE, NSTEMI, aspiration.  The patient is on the cardiac monitor to evaluate for evidence of arrhythmia and/or significant heart rate changes.  Patient is toxic appearing upon arrival and appeared to be close to death.  After verifying that she was DNR but that no family was immediately available, we initiated a probable sepsis work-up and started a liter bolus of IV fluids and put her on a nonrebreather with 15 L of oxygen.  She is not  appropriate for BiPAP given her altered mental status and inability to protect her airway from aspiration; I suspect she has already aspirated given that she apparently has had a prolonged time down and I can hear coarse breath sounds suggestive of aspiration.        Clinical Course as of December 07, 2020 1655  Wed Dec 07, 2020 I was called to the room when the patient began to develop profound bradycardia.  I ordered atropine 1 mg IV, but as I entered the room the patient's bradycardia became worse within seconds and then she went into asystole.  She had a brief few seconds of an organized rhythm with a rate of about 30, but no palpable pulse.  I auscultated for a heartbeat for approximately 90 seconds and heard nothing, and she had no palpable pulse.  Time of death 6:57 AM. [CF]  [phone] Discussed case by phone with Izora Ribas, the on-call medical examiner.  We discussed the case and the specifics of her passing, and we are in agreement that this is not an appropriate medical examiner case.  It is appropriate for her PCP, Dr. Kary Kos, to sign the death certificate.   [CF]  [phone] No word yet from family.  I will attempt to call them if they have not arrived at the front desk soon. [CF]  [phone] I personally reviewed the patient's imaging and agree with the radiologist's interpretation that there is evidence of worsening bronchopneumonia which likely contributed substantially to her respiratory arrest. [CF]  [phone] Patient's daughter arrived, I was able to discuss her mother's death and the circumstances surrounding it.  Answered all questions to the best my ability.  Family will now take turns with viewing the body.  She confirms that there is no desire on the family's part for an autopsy. [CF]    Clinical Course User Index [CF]  Hinda Kehr, MD     ____________________________________________  FINAL CLINICAL IMPRESSION(S) / ED DIAGNOSES  Final diagnoses:  Cardiopulmonary arrest [facility])      MEDICATIONS GIVEN DURING THIS VISIT:  Medications - No data to display   ED Discharge Orders     None        Note:  This document was prepared using Dragon voice recognition software and may include unintentional dictation errors.   Hinda Kehr, MD November 29, 2020 [phone]

## 2020-12-01 NOTE — ED Notes (Signed)
2712 Time of death. MD Karma Greaser at bedside. Asystole on monitor and verified with ascultation.

## 2020-12-01 DEATH — deceased

## 2020-12-21 LAB — FUNGUS CULTURE, BLOOD
Culture: NO GROWTH
Special Requests: ADEQUATE

## 2021-02-03 ENCOUNTER — Ambulatory Visit: Payer: Medicare HMO | Admitting: Podiatry
